# Patient Record
Sex: Male | Born: 1960 | Race: Black or African American | Hispanic: No | Smoking: Former smoker
Health system: Southern US, Community
[De-identification: ages and names within clinical notes are randomized; demographics above are authoritative.]

## PROBLEM LIST (undated history)

## (undated) DIAGNOSIS — K219 Gastro-esophageal reflux disease without esophagitis: Secondary | ICD-10-CM

## (undated) DIAGNOSIS — Z720 Tobacco use: Secondary | ICD-10-CM

## (undated) DIAGNOSIS — R55 Syncope and collapse: Secondary | ICD-10-CM

## (undated) DIAGNOSIS — F1910 Other psychoactive substance abuse, uncomplicated: Secondary | ICD-10-CM

## (undated) DIAGNOSIS — F319 Bipolar disorder, unspecified: Secondary | ICD-10-CM

## (undated) DIAGNOSIS — I509 Heart failure, unspecified: Secondary | ICD-10-CM

## (undated) DIAGNOSIS — G4730 Sleep apnea, unspecified: Secondary | ICD-10-CM

## (undated) DIAGNOSIS — Z91199 Patient's noncompliance with other medical treatment and regimen due to unspecified reason: Secondary | ICD-10-CM

## (undated) DIAGNOSIS — I8290 Acute embolism and thrombosis of unspecified vein: Secondary | ICD-10-CM

## (undated) DIAGNOSIS — F329 Major depressive disorder, single episode, unspecified: Secondary | ICD-10-CM

## (undated) DIAGNOSIS — Z9119 Patient's noncompliance with other medical treatment and regimen: Secondary | ICD-10-CM

## (undated) DIAGNOSIS — I5022 Chronic systolic (congestive) heart failure: Secondary | ICD-10-CM

## (undated) DIAGNOSIS — I513 Intracardiac thrombosis, not elsewhere classified: Secondary | ICD-10-CM

## (undated) DIAGNOSIS — E7800 Pure hypercholesterolemia, unspecified: Secondary | ICD-10-CM

## (undated) DIAGNOSIS — Z95810 Presence of automatic (implantable) cardiac defibrillator: Secondary | ICD-10-CM

## (undated) DIAGNOSIS — N189 Chronic kidney disease, unspecified: Secondary | ICD-10-CM

## (undated) DIAGNOSIS — R160 Hepatomegaly, not elsewhere classified: Secondary | ICD-10-CM

## (undated) DIAGNOSIS — R0609 Other forms of dyspnea: Secondary | ICD-10-CM

## (undated) DIAGNOSIS — I10 Essential (primary) hypertension: Secondary | ICD-10-CM

## (undated) DIAGNOSIS — Z8739 Personal history of other diseases of the musculoskeletal system and connective tissue: Secondary | ICD-10-CM

## (undated) DIAGNOSIS — Z9289 Personal history of other medical treatment: Secondary | ICD-10-CM

## (undated) DIAGNOSIS — I059 Rheumatic mitral valve disease, unspecified: Secondary | ICD-10-CM

## (undated) DIAGNOSIS — G43909 Migraine, unspecified, not intractable, without status migrainosus: Secondary | ICD-10-CM

## (undated) DIAGNOSIS — I340 Nonrheumatic mitral (valve) insufficiency: Secondary | ICD-10-CM

## (undated) DIAGNOSIS — G43009 Migraine without aura, not intractable, without status migrainosus: Secondary | ICD-10-CM

## (undated) HISTORY — DX: Major depressive disorder, single episode, unspecified: F32.9

## (undated) HISTORY — DX: Intracardiac thrombosis, not elsewhere classified: I51.3

## (undated) HISTORY — DX: Chronic kidney disease, unspecified: N18.9

## (undated) HISTORY — DX: Acute embolism and thrombosis of unspecified vein: I82.90

## (undated) HISTORY — PX: TRACHEOSTOMY: SUR1362

## (undated) HISTORY — DX: Patient's noncompliance with other medical treatment and regimen: Z91.19

## (undated) HISTORY — DX: Essential (primary) hypertension: I10

## (undated) HISTORY — DX: Presence of automatic (implantable) cardiac defibrillator: Z95.810

## (undated) HISTORY — DX: Heart failure, unspecified: I50.9

## (undated) HISTORY — DX: Syncope and collapse: R55

## (undated) HISTORY — DX: Rheumatic mitral valve disease, unspecified: I05.9

## (undated) HISTORY — DX: Other psychoactive substance abuse, uncomplicated: F19.10

## (undated) HISTORY — DX: Hepatomegaly, not elsewhere classified: R16.0

## (undated) HISTORY — DX: Bipolar disorder, unspecified: F31.9

## (undated) HISTORY — PX: CARDIAC DEFIBRILLATOR PLACEMENT: SHX171

## (undated) HISTORY — DX: Gastro-esophageal reflux disease without esophagitis: K21.9

## (undated) HISTORY — DX: Migraine, unspecified, not intractable, without status migrainosus: G43.909

## (undated) HISTORY — DX: Tobacco use: Z72.0

## (undated) HISTORY — DX: Nonrheumatic mitral (valve) insufficiency: I34.0

## (undated) HISTORY — DX: Patient's noncompliance with other medical treatment and regimen due to unspecified reason: Z91.199

## (undated) HISTORY — DX: Chronic systolic (congestive) heart failure: I50.22

## (undated) HISTORY — DX: Migraine without aura, not intractable, without status migrainosus: G43.009

---

## 1999-06-15 ENCOUNTER — Emergency Department (HOSPITAL_COMMUNITY): Admission: EM | Admit: 1999-06-15 | Discharge: 1999-06-15 | Payer: Self-pay

## 2001-02-02 ENCOUNTER — Encounter: Payer: Self-pay | Admitting: Emergency Medicine

## 2001-02-02 ENCOUNTER — Emergency Department (HOSPITAL_COMMUNITY): Admission: EM | Admit: 2001-02-02 | Discharge: 2001-02-02 | Payer: Self-pay | Admitting: Emergency Medicine

## 2001-05-28 ENCOUNTER — Encounter: Payer: Self-pay | Admitting: Emergency Medicine

## 2001-05-28 ENCOUNTER — Emergency Department (HOSPITAL_COMMUNITY): Admission: EM | Admit: 2001-05-28 | Discharge: 2001-05-28 | Payer: Self-pay | Admitting: Emergency Medicine

## 2001-07-23 ENCOUNTER — Encounter: Payer: Self-pay | Admitting: Emergency Medicine

## 2001-07-23 ENCOUNTER — Emergency Department (HOSPITAL_COMMUNITY): Admission: EM | Admit: 2001-07-23 | Discharge: 2001-07-23 | Payer: Self-pay | Admitting: Emergency Medicine

## 2002-08-30 ENCOUNTER — Inpatient Hospital Stay (HOSPITAL_COMMUNITY): Admission: EM | Admit: 2002-08-30 | Discharge: 2002-09-02 | Payer: Self-pay

## 2002-08-31 ENCOUNTER — Encounter (INDEPENDENT_AMBULATORY_CARE_PROVIDER_SITE_OTHER): Payer: Self-pay | Admitting: Cardiology

## 2002-08-31 ENCOUNTER — Encounter: Payer: Self-pay | Admitting: Internal Medicine

## 2003-08-22 ENCOUNTER — Ambulatory Visit (HOSPITAL_COMMUNITY): Admission: RE | Admit: 2003-08-22 | Discharge: 2003-08-22 | Payer: Self-pay | Admitting: Family Medicine

## 2003-10-21 ENCOUNTER — Ambulatory Visit (HOSPITAL_COMMUNITY): Admission: RE | Admit: 2003-10-21 | Discharge: 2003-10-21 | Payer: Self-pay | Admitting: Family Medicine

## 2003-10-27 ENCOUNTER — Ambulatory Visit (HOSPITAL_COMMUNITY): Admission: RE | Admit: 2003-10-27 | Discharge: 2003-10-27 | Payer: Self-pay | Admitting: Family Medicine

## 2003-11-01 ENCOUNTER — Emergency Department (HOSPITAL_COMMUNITY): Admission: EM | Admit: 2003-11-01 | Discharge: 2003-11-01 | Payer: Self-pay | Admitting: Emergency Medicine

## 2004-03-06 ENCOUNTER — Ambulatory Visit: Payer: Self-pay | Admitting: Family Medicine

## 2004-03-30 ENCOUNTER — Encounter: Payer: Self-pay | Admitting: Cardiology

## 2004-03-30 ENCOUNTER — Inpatient Hospital Stay (HOSPITAL_COMMUNITY): Admission: EM | Admit: 2004-03-30 | Discharge: 2004-03-31 | Payer: Self-pay | Admitting: Emergency Medicine

## 2004-05-24 ENCOUNTER — Ambulatory Visit: Payer: Self-pay | Admitting: Internal Medicine

## 2004-06-13 ENCOUNTER — Emergency Department (HOSPITAL_COMMUNITY): Admission: EM | Admit: 2004-06-13 | Discharge: 2004-06-13 | Payer: Self-pay | Admitting: Emergency Medicine

## 2004-06-19 ENCOUNTER — Inpatient Hospital Stay (HOSPITAL_COMMUNITY): Admission: EM | Admit: 2004-06-19 | Discharge: 2004-06-21 | Payer: Self-pay | Admitting: *Deleted

## 2004-06-27 ENCOUNTER — Ambulatory Visit: Payer: Self-pay | Admitting: Family Medicine

## 2004-06-28 ENCOUNTER — Ambulatory Visit: Payer: Self-pay | Admitting: Internal Medicine

## 2004-07-25 ENCOUNTER — Ambulatory Visit: Payer: Self-pay | Admitting: Internal Medicine

## 2004-07-28 ENCOUNTER — Emergency Department (HOSPITAL_COMMUNITY): Admission: EM | Admit: 2004-07-28 | Discharge: 2004-07-28 | Payer: Self-pay | Admitting: Emergency Medicine

## 2004-08-01 ENCOUNTER — Ambulatory Visit: Payer: Self-pay | Admitting: Internal Medicine

## 2004-08-01 ENCOUNTER — Inpatient Hospital Stay (HOSPITAL_COMMUNITY): Admission: EM | Admit: 2004-08-01 | Discharge: 2004-08-04 | Payer: Self-pay | Admitting: Emergency Medicine

## 2004-08-01 ENCOUNTER — Ambulatory Visit: Payer: Self-pay | Admitting: Cardiology

## 2004-08-07 ENCOUNTER — Ambulatory Visit: Payer: Self-pay | Admitting: Internal Medicine

## 2004-08-10 ENCOUNTER — Inpatient Hospital Stay (HOSPITAL_COMMUNITY): Admission: RE | Admit: 2004-08-10 | Discharge: 2004-08-11 | Payer: Self-pay | Admitting: Internal Medicine

## 2004-08-19 ENCOUNTER — Ambulatory Visit: Payer: Self-pay | Admitting: Internal Medicine

## 2004-08-19 ENCOUNTER — Encounter: Payer: Self-pay | Admitting: Emergency Medicine

## 2004-08-19 ENCOUNTER — Inpatient Hospital Stay (HOSPITAL_COMMUNITY): Admission: AD | Admit: 2004-08-19 | Discharge: 2004-08-22 | Payer: Self-pay | Admitting: Cardiovascular Disease

## 2004-08-21 ENCOUNTER — Encounter: Payer: Self-pay | Admitting: Cardiology

## 2004-08-21 ENCOUNTER — Ambulatory Visit: Payer: Self-pay | Admitting: Cardiology

## 2004-09-02 ENCOUNTER — Emergency Department (HOSPITAL_COMMUNITY): Admission: EM | Admit: 2004-09-02 | Discharge: 2004-09-02 | Payer: Self-pay | Admitting: Emergency Medicine

## 2004-11-01 ENCOUNTER — Emergency Department (HOSPITAL_COMMUNITY): Admission: EM | Admit: 2004-11-01 | Discharge: 2004-11-01 | Payer: Self-pay | Admitting: Emergency Medicine

## 2004-12-19 ENCOUNTER — Ambulatory Visit: Payer: Self-pay | Admitting: Internal Medicine

## 2005-02-18 ENCOUNTER — Emergency Department (HOSPITAL_COMMUNITY): Admission: EM | Admit: 2005-02-18 | Discharge: 2005-02-18 | Payer: Self-pay | Admitting: *Deleted

## 2005-02-18 ENCOUNTER — Ambulatory Visit: Payer: Self-pay | Admitting: Cardiovascular Disease

## 2005-03-25 ENCOUNTER — Ambulatory Visit: Payer: Self-pay | Admitting: Internal Medicine

## 2005-05-13 ENCOUNTER — Ambulatory Visit: Payer: Self-pay | Admitting: Internal Medicine

## 2005-05-31 ENCOUNTER — Ambulatory Visit: Payer: Self-pay | Admitting: Cardiovascular Disease

## 2005-06-01 ENCOUNTER — Inpatient Hospital Stay (HOSPITAL_COMMUNITY): Admission: EM | Admit: 2005-06-01 | Discharge: 2005-06-05 | Payer: Self-pay | Admitting: *Deleted

## 2005-06-27 ENCOUNTER — Ambulatory Visit: Payer: Self-pay | Admitting: Internal Medicine

## 2005-07-29 ENCOUNTER — Emergency Department (HOSPITAL_COMMUNITY): Admission: EM | Admit: 2005-07-29 | Discharge: 2005-07-29 | Payer: Self-pay | Admitting: Family Medicine

## 2005-07-29 ENCOUNTER — Ambulatory Visit: Payer: Self-pay | Admitting: Internal Medicine

## 2005-10-17 ENCOUNTER — Ambulatory Visit: Payer: Self-pay | Admitting: Internal Medicine

## 2006-01-04 ENCOUNTER — Emergency Department (HOSPITAL_COMMUNITY): Admission: EM | Admit: 2006-01-04 | Discharge: 2006-01-04 | Payer: Self-pay | Admitting: Emergency Medicine

## 2006-01-17 ENCOUNTER — Emergency Department (HOSPITAL_COMMUNITY): Admission: EM | Admit: 2006-01-17 | Discharge: 2006-01-17 | Payer: Self-pay | Admitting: Emergency Medicine

## 2006-05-06 ENCOUNTER — Ambulatory Visit: Payer: Self-pay | Admitting: *Deleted

## 2006-05-06 ENCOUNTER — Inpatient Hospital Stay (HOSPITAL_COMMUNITY): Admission: EM | Admit: 2006-05-06 | Discharge: 2006-05-09 | Payer: Self-pay | Admitting: Emergency Medicine

## 2006-05-07 ENCOUNTER — Encounter: Payer: Self-pay | Admitting: Cardiology

## 2006-05-13 ENCOUNTER — Ambulatory Visit: Payer: Self-pay | Admitting: Internal Medicine

## 2006-05-23 ENCOUNTER — Ambulatory Visit: Payer: Self-pay | Admitting: Cardiology

## 2006-06-04 ENCOUNTER — Ambulatory Visit: Payer: Self-pay | Admitting: Internal Medicine

## 2006-07-01 ENCOUNTER — Ambulatory Visit: Payer: Self-pay | Admitting: Cardiology

## 2006-07-01 ENCOUNTER — Inpatient Hospital Stay (HOSPITAL_COMMUNITY): Admission: EM | Admit: 2006-07-01 | Discharge: 2006-07-02 | Payer: Self-pay | Admitting: Emergency Medicine

## 2006-08-11 ENCOUNTER — Ambulatory Visit: Payer: Self-pay | Admitting: Internal Medicine

## 2006-08-11 LAB — CONVERTED CEMR LAB
CO2: 30 meq/L (ref 19–32)
Calcium: 9.1 mg/dL (ref 8.4–10.5)
Creatinine, Ser: 1.3 mg/dL (ref 0.4–1.5)
GFR calc Af Amer: 77 mL/min
Glucose, Bld: 90 mg/dL (ref 70–99)
Pro B Natriuretic peptide (BNP): 913 pg/mL — ABNORMAL HIGH (ref 0.0–100.0)
TSH: 1.74 microintl units/mL (ref 0.35–5.50)

## 2006-08-20 ENCOUNTER — Ambulatory Visit: Payer: Self-pay | Admitting: Cardiology

## 2006-08-20 ENCOUNTER — Ambulatory Visit: Payer: Self-pay | Admitting: Internal Medicine

## 2006-08-20 ENCOUNTER — Emergency Department (HOSPITAL_COMMUNITY): Admission: EM | Admit: 2006-08-20 | Discharge: 2006-08-20 | Payer: Self-pay | Admitting: Emergency Medicine

## 2006-08-20 LAB — CONVERTED CEMR LAB
BUN: 14 mg/dL (ref 6–23)
Calcium: 9.1 mg/dL (ref 8.4–10.5)
Chloride: 110 meq/L (ref 96–112)
Creatinine, Ser: 1.2 mg/dL (ref 0.4–1.5)
GFR calc non Af Amer: 70 mL/min
Sodium: 147 meq/L — ABNORMAL HIGH (ref 135–145)

## 2006-08-25 ENCOUNTER — Ambulatory Visit: Payer: Self-pay | Admitting: Internal Medicine

## 2006-08-25 LAB — CONVERTED CEMR LAB
BUN: 13 mg/dL (ref 6–23)
CO2: 29 meq/L (ref 19–32)
Calcium: 9.2 mg/dL (ref 8.4–10.5)
Chloride: 105 meq/L (ref 96–112)
Creatinine, Ser: 1.4 mg/dL (ref 0.4–1.5)

## 2006-08-27 ENCOUNTER — Ambulatory Visit: Payer: Self-pay | Admitting: *Deleted

## 2006-08-27 LAB — CONVERTED CEMR LAB
BUN: 11 mg/dL (ref 6–23)
CO2: 30 meq/L (ref 19–32)
Calcium: 8.8 mg/dL (ref 8.4–10.5)
Chloride: 103 meq/L (ref 96–112)
Creatinine, Ser: 0.4 mg/dL (ref 0.4–1.5)
GFR calc Af Amer: 302 mL/min
Glucose, Bld: 93 mg/dL (ref 70–99)

## 2006-09-26 ENCOUNTER — Ambulatory Visit: Payer: Self-pay | Admitting: Internal Medicine

## 2006-09-26 ENCOUNTER — Inpatient Hospital Stay (HOSPITAL_COMMUNITY): Admission: EM | Admit: 2006-09-26 | Discharge: 2006-09-28 | Payer: Self-pay | Admitting: Emergency Medicine

## 2006-10-03 ENCOUNTER — Ambulatory Visit: Payer: Self-pay | Admitting: Internal Medicine

## 2006-10-03 LAB — CONVERTED CEMR LAB
ALT: 25 units/L (ref 0–40)
AST: 27 units/L (ref 0–37)
Alkaline Phosphatase: 52 units/L (ref 39–117)
Basophils Relative: 3.6 % — ABNORMAL HIGH (ref 0.0–1.0)
Bilirubin, Direct: 0.4 mg/dL — ABNORMAL HIGH (ref 0.0–0.3)
Calcium: 9.2 mg/dL (ref 8.4–10.5)
Chloride: 106 meq/L (ref 96–112)
Eosinophils Absolute: 0.3 10*3/uL (ref 0.0–0.6)
Eosinophils Relative: 5.3 % — ABNORMAL HIGH (ref 0.0–5.0)
GFR calc non Af Amer: 59 mL/min
Glucose, Bld: 118 mg/dL — ABNORMAL HIGH (ref 70–99)
HCT: 52.7 % — ABNORMAL HIGH (ref 39.0–52.0)
Hemoglobin: 17.3 g/dL — ABNORMAL HIGH (ref 13.0–17.0)
Lymphocytes Relative: 38.2 % (ref 12.0–46.0)
MCHC: 32.8 g/dL (ref 30.0–36.0)
Monocytes Relative: 5.7 % (ref 3.0–11.0)
Platelets: 297 10*3/uL (ref 150–400)
RBC: 5.61 M/uL (ref 4.22–5.81)

## 2006-10-08 ENCOUNTER — Emergency Department (HOSPITAL_COMMUNITY): Admission: EM | Admit: 2006-10-08 | Discharge: 2006-10-08 | Payer: Self-pay | Admitting: Emergency Medicine

## 2006-10-17 ENCOUNTER — Emergency Department (HOSPITAL_COMMUNITY): Admission: EM | Admit: 2006-10-17 | Discharge: 2006-10-17 | Payer: Self-pay | Admitting: *Deleted

## 2006-11-04 ENCOUNTER — Ambulatory Visit: Payer: Self-pay | Admitting: Gastroenterology

## 2006-11-04 LAB — CONVERTED CEMR LAB
Amylase: 35 units/L (ref 27–131)
Bilirubin, Direct: 0.6 mg/dL — ABNORMAL HIGH (ref 0.0–0.3)
CO2: 28 meq/L (ref 19–32)
Eosinophils Absolute: 0.1 10*3/uL (ref 0.0–0.6)
Eosinophils Relative: 1 % (ref 0.0–5.0)
GFR calc Af Amer: 61 mL/min
GFR calc non Af Amer: 50 mL/min
Glucose, Bld: 109 mg/dL — ABNORMAL HIGH (ref 70–99)
HCT: 52.5 % — ABNORMAL HIGH (ref 39.0–52.0)
Hemoglobin: 17.7 g/dL — ABNORMAL HIGH (ref 13.0–17.0)
Lymphocytes Relative: 27.7 % (ref 12.0–46.0)
MCV: 89.3 fL (ref 78.0–100.0)
Monocytes Absolute: 0.5 10*3/uL (ref 0.2–0.7)
Neutro Abs: 3.9 10*3/uL (ref 1.4–7.7)
Neutrophils Relative %: 62.9 % (ref 43.0–77.0)
Platelets: 279 10*3/uL (ref 150–400)
Sodium: 139 meq/L (ref 135–145)
TSH: 2.72 microintl units/mL (ref 0.35–5.50)
Total Protein: 6.5 g/dL (ref 6.0–8.3)
WBC: 6.2 10*3/uL (ref 4.5–10.5)

## 2006-11-13 ENCOUNTER — Emergency Department (HOSPITAL_COMMUNITY): Admission: EM | Admit: 2006-11-13 | Discharge: 2006-11-14 | Payer: Self-pay | Admitting: Emergency Medicine

## 2006-11-14 ENCOUNTER — Inpatient Hospital Stay (HOSPITAL_COMMUNITY): Admission: EM | Admit: 2006-11-14 | Discharge: 2006-12-03 | Payer: Self-pay | Admitting: Emergency Medicine

## 2006-11-14 ENCOUNTER — Ambulatory Visit: Payer: Self-pay | Admitting: Internal Medicine

## 2006-11-27 ENCOUNTER — Encounter: Payer: Self-pay | Admitting: Internal Medicine

## 2006-12-04 ENCOUNTER — Ambulatory Visit: Payer: Self-pay | Admitting: Cardiology

## 2006-12-04 LAB — CONVERTED CEMR LAB: INR: 2.4 — ABNORMAL HIGH (ref 0.9–2.0)

## 2006-12-11 ENCOUNTER — Ambulatory Visit: Payer: Self-pay | Admitting: Internal Medicine

## 2006-12-11 LAB — CONVERTED CEMR LAB
BUN: 7 mg/dL (ref 6–23)
CO2: 29 meq/L (ref 19–32)
Chloride: 105 meq/L (ref 96–112)
Creatinine, Ser: 1 mg/dL (ref 0.4–1.5)
Potassium: 4.1 meq/L (ref 3.5–5.1)
Sodium: 141 meq/L (ref 135–145)

## 2006-12-16 ENCOUNTER — Ambulatory Visit: Payer: Self-pay | Admitting: Cardiology

## 2006-12-22 ENCOUNTER — Ambulatory Visit: Payer: Self-pay | Admitting: Cardiology

## 2006-12-22 ENCOUNTER — Ambulatory Visit: Payer: Self-pay | Admitting: Internal Medicine

## 2006-12-29 ENCOUNTER — Ambulatory Visit: Payer: Self-pay | Admitting: Internal Medicine

## 2006-12-29 LAB — CONVERTED CEMR LAB
AST: 28 units/L (ref 0–37)
Albumin: 3.6 g/dL (ref 3.5–5.2)
Alkaline Phosphatase: 73 units/L (ref 39–117)
BUN: 9 mg/dL (ref 6–23)
CO2: 32 meq/L (ref 19–32)
Chloride: 112 meq/L (ref 96–112)
Creatinine, Ser: 1.1 mg/dL (ref 0.4–1.5)
Potassium: 4.6 meq/L (ref 3.5–5.1)
Total Bilirubin: 1.2 mg/dL (ref 0.3–1.2)
Total Protein: 6.6 g/dL (ref 6.0–8.3)

## 2007-01-05 ENCOUNTER — Ambulatory Visit: Payer: Self-pay | Admitting: Cardiovascular Disease

## 2007-01-15 ENCOUNTER — Ambulatory Visit: Payer: Self-pay | Admitting: Cardiology

## 2007-01-19 ENCOUNTER — Ambulatory Visit: Payer: Self-pay | Admitting: Internal Medicine

## 2007-01-22 ENCOUNTER — Ambulatory Visit: Payer: Self-pay | Admitting: Cardiology

## 2007-02-17 ENCOUNTER — Ambulatory Visit: Payer: Self-pay | Admitting: Internal Medicine

## 2007-02-17 ENCOUNTER — Ambulatory Visit: Payer: Self-pay | Admitting: Cardiology

## 2007-02-17 LAB — CONVERTED CEMR LAB
ALT: 19 units/L (ref 0–53)
Bilirubin, Direct: 0.2 mg/dL (ref 0.0–0.3)
CO2: 30 meq/L (ref 19–32)
Calcium: 9.8 mg/dL (ref 8.4–10.5)
GFR calc Af Amer: 105 mL/min
GFR calc non Af Amer: 87 mL/min
Glucose, Bld: 78 mg/dL (ref 70–99)
Magnesium: 2 mg/dL (ref 1.5–2.5)
Potassium: 3.8 meq/L (ref 3.5–5.1)
Pro B Natriuretic peptide (BNP): 189 pg/mL — ABNORMAL HIGH (ref 0.0–100.0)
Sodium: 142 meq/L (ref 135–145)
Total Protein: 8.5 g/dL — ABNORMAL HIGH (ref 6.0–8.3)

## 2007-03-18 ENCOUNTER — Ambulatory Visit: Payer: Self-pay | Admitting: Internal Medicine

## 2007-04-08 ENCOUNTER — Ambulatory Visit: Payer: Self-pay | Admitting: Cardiology

## 2007-04-23 ENCOUNTER — Ambulatory Visit: Payer: Self-pay | Admitting: Internal Medicine

## 2007-05-12 ENCOUNTER — Ambulatory Visit: Payer: Self-pay | Admitting: Internal Medicine

## 2007-05-22 ENCOUNTER — Encounter: Payer: Self-pay | Admitting: Internal Medicine

## 2007-05-22 ENCOUNTER — Ambulatory Visit: Payer: Self-pay

## 2007-06-09 ENCOUNTER — Ambulatory Visit: Payer: Self-pay | Admitting: Internal Medicine

## 2007-07-09 ENCOUNTER — Ambulatory Visit: Payer: Self-pay | Admitting: Internal Medicine

## 2007-07-10 ENCOUNTER — Ambulatory Visit (HOSPITAL_COMMUNITY): Admission: RE | Admit: 2007-07-10 | Discharge: 2007-07-10 | Payer: Self-pay | Admitting: Internal Medicine

## 2007-07-24 ENCOUNTER — Ambulatory Visit (HOSPITAL_COMMUNITY): Admission: RE | Admit: 2007-07-24 | Discharge: 2007-07-24 | Payer: Self-pay | Admitting: Internal Medicine

## 2007-08-18 ENCOUNTER — Ambulatory Visit: Payer: Self-pay | Admitting: Cardiovascular Disease

## 2007-08-19 ENCOUNTER — Inpatient Hospital Stay (HOSPITAL_COMMUNITY): Admission: EM | Admit: 2007-08-19 | Discharge: 2007-08-21 | Payer: Self-pay | Admitting: Emergency Medicine

## 2007-08-27 ENCOUNTER — Ambulatory Visit: Payer: Self-pay | Admitting: Cardiovascular Disease

## 2007-08-27 LAB — CONVERTED CEMR LAB
BUN: 14 mg/dL (ref 6–23)
CO2: 33 meq/L — ABNORMAL HIGH (ref 19–32)
Calcium: 9.3 mg/dL (ref 8.4–10.5)
GFR calc Af Amer: 65 mL/min
GFR calc non Af Amer: 54 mL/min
Potassium: 4.5 meq/L (ref 3.5–5.1)

## 2007-09-01 ENCOUNTER — Encounter: Payer: Self-pay | Admitting: Internal Medicine

## 2007-09-03 ENCOUNTER — Ambulatory Visit: Payer: Self-pay | Admitting: Cardiology

## 2007-09-28 ENCOUNTER — Encounter: Payer: Self-pay | Admitting: Internal Medicine

## 2007-10-16 ENCOUNTER — Encounter: Payer: Self-pay | Admitting: Internal Medicine

## 2007-10-22 ENCOUNTER — Telehealth (INDEPENDENT_AMBULATORY_CARE_PROVIDER_SITE_OTHER): Payer: Self-pay | Admitting: *Deleted

## 2007-10-22 ENCOUNTER — Ambulatory Visit: Payer: Self-pay | Admitting: Internal Medicine

## 2007-11-23 ENCOUNTER — Ambulatory Visit: Payer: Self-pay | Admitting: Cardiovascular Disease

## 2007-11-23 ENCOUNTER — Ambulatory Visit: Payer: Self-pay | Admitting: Internal Medicine

## 2008-01-07 ENCOUNTER — Encounter: Payer: Self-pay | Admitting: Internal Medicine

## 2008-03-16 ENCOUNTER — Ambulatory Visit: Payer: Self-pay | Admitting: Internal Medicine

## 2008-04-15 ENCOUNTER — Ambulatory Visit: Payer: Self-pay | Admitting: Cardiology

## 2008-04-15 ENCOUNTER — Inpatient Hospital Stay (HOSPITAL_COMMUNITY): Admission: EM | Admit: 2008-04-15 | Discharge: 2008-04-18 | Payer: Self-pay | Admitting: Emergency Medicine

## 2008-04-20 ENCOUNTER — Ambulatory Visit: Payer: Self-pay | Admitting: Cardiology

## 2008-04-26 ENCOUNTER — Emergency Department (HOSPITAL_COMMUNITY): Admission: EM | Admit: 2008-04-26 | Discharge: 2008-04-26 | Payer: Self-pay | Admitting: Emergency Medicine

## 2008-04-27 ENCOUNTER — Ambulatory Visit: Payer: Self-pay | Admitting: Cardiovascular Disease

## 2008-04-27 LAB — CONVERTED CEMR LAB: INR: 5.3 — ABNORMAL HIGH (ref 0.8–1.0)

## 2008-06-16 ENCOUNTER — Ambulatory Visit: Payer: Self-pay | Admitting: Internal Medicine

## 2008-06-21 ENCOUNTER — Ambulatory Visit: Payer: Self-pay | Admitting: Cardiology

## 2008-06-21 ENCOUNTER — Inpatient Hospital Stay (HOSPITAL_COMMUNITY): Admission: EM | Admit: 2008-06-21 | Discharge: 2008-06-23 | Payer: Self-pay | Admitting: Emergency Medicine

## 2008-06-27 ENCOUNTER — Ambulatory Visit: Payer: Self-pay | Admitting: Internal Medicine

## 2008-07-04 ENCOUNTER — Encounter: Payer: Self-pay | Admitting: Internal Medicine

## 2008-07-05 ENCOUNTER — Ambulatory Visit: Payer: Self-pay | Admitting: Internal Medicine

## 2008-07-05 ENCOUNTER — Ambulatory Visit: Payer: Self-pay | Admitting: Cardiovascular Disease

## 2008-07-18 ENCOUNTER — Ambulatory Visit: Payer: Self-pay | Admitting: Internal Medicine

## 2008-07-18 ENCOUNTER — Ambulatory Visit: Payer: Self-pay | Admitting: Cardiology

## 2008-08-02 ENCOUNTER — Ambulatory Visit: Payer: Self-pay | Admitting: Internal Medicine

## 2008-08-02 LAB — CONVERTED CEMR LAB
CO2: 30 meq/L (ref 19–32)
Calcium: 9 mg/dL (ref 8.4–10.5)
Creatinine, Ser: 1.3 mg/dL (ref 0.4–1.5)
GFR calc Af Amer: 77 mL/min
Pro B Natriuretic peptide (BNP): 1607 pg/mL — ABNORMAL HIGH (ref 0.0–100.0)
Sodium: 143 meq/L (ref 135–145)

## 2008-08-22 ENCOUNTER — Inpatient Hospital Stay (HOSPITAL_COMMUNITY): Admission: EM | Admit: 2008-08-22 | Discharge: 2008-08-27 | Payer: Self-pay | Admitting: Emergency Medicine

## 2008-08-22 ENCOUNTER — Ambulatory Visit: Payer: Self-pay | Admitting: Cardiology

## 2008-09-16 ENCOUNTER — Encounter: Payer: Self-pay | Admitting: Nurse Practitioner

## 2008-09-16 ENCOUNTER — Ambulatory Visit: Payer: Self-pay | Admitting: Cardiology

## 2008-09-16 DIAGNOSIS — R160 Hepatomegaly, not elsewhere classified: Secondary | ICD-10-CM

## 2008-09-16 DIAGNOSIS — R9389 Abnormal findings on diagnostic imaging of other specified body structures: Secondary | ICD-10-CM

## 2008-09-16 DIAGNOSIS — R031 Nonspecific low blood-pressure reading: Secondary | ICD-10-CM | POA: Insufficient documentation

## 2008-09-16 DIAGNOSIS — R0602 Shortness of breath: Secondary | ICD-10-CM

## 2008-09-16 DIAGNOSIS — R5381 Other malaise: Secondary | ICD-10-CM | POA: Insufficient documentation

## 2008-09-16 DIAGNOSIS — R5383 Other fatigue: Secondary | ICD-10-CM

## 2008-09-16 DIAGNOSIS — R012 Other cardiac sounds: Secondary | ICD-10-CM | POA: Insufficient documentation

## 2008-09-16 DIAGNOSIS — Z95810 Presence of automatic (implantable) cardiac defibrillator: Secondary | ICD-10-CM

## 2008-09-16 HISTORY — DX: Hepatomegaly, not elsewhere classified: R16.0

## 2008-09-16 HISTORY — DX: Presence of automatic (implantable) cardiac defibrillator: Z95.810

## 2008-09-20 ENCOUNTER — Encounter: Payer: Self-pay | Admitting: Internal Medicine

## 2008-09-22 ENCOUNTER — Encounter: Payer: Self-pay | Admitting: Internal Medicine

## 2008-09-22 ENCOUNTER — Ambulatory Visit: Payer: Self-pay | Admitting: Internal Medicine

## 2008-09-22 DIAGNOSIS — I5022 Chronic systolic (congestive) heart failure: Secondary | ICD-10-CM

## 2008-09-22 DIAGNOSIS — I509 Heart failure, unspecified: Secondary | ICD-10-CM

## 2008-09-22 DIAGNOSIS — I059 Rheumatic mitral valve disease, unspecified: Secondary | ICD-10-CM

## 2008-09-22 HISTORY — DX: Rheumatic mitral valve disease, unspecified: I05.9

## 2008-09-22 HISTORY — DX: Chronic systolic (congestive) heart failure: I50.22

## 2008-09-26 ENCOUNTER — Encounter: Payer: Self-pay | Admitting: Internal Medicine

## 2008-09-29 ENCOUNTER — Ambulatory Visit: Payer: Self-pay | Admitting: Internal Medicine

## 2008-09-29 ENCOUNTER — Encounter: Payer: Self-pay | Admitting: Nurse Practitioner

## 2008-10-25 ENCOUNTER — Encounter: Payer: Self-pay | Admitting: Internal Medicine

## 2008-10-25 ENCOUNTER — Ambulatory Visit: Payer: Self-pay | Admitting: Internal Medicine

## 2008-11-03 ENCOUNTER — Telehealth: Payer: Self-pay | Admitting: Internal Medicine

## 2008-11-04 ENCOUNTER — Encounter: Payer: Self-pay | Admitting: Internal Medicine

## 2008-11-09 ENCOUNTER — Telehealth: Payer: Self-pay | Admitting: Internal Medicine

## 2008-11-22 ENCOUNTER — Encounter: Payer: Self-pay | Admitting: *Deleted

## 2008-11-25 ENCOUNTER — Telehealth: Payer: Self-pay | Admitting: Internal Medicine

## 2008-12-06 ENCOUNTER — Ambulatory Visit: Payer: Self-pay | Admitting: Internal Medicine

## 2008-12-06 ENCOUNTER — Encounter (INDEPENDENT_AMBULATORY_CARE_PROVIDER_SITE_OTHER): Payer: Self-pay | Admitting: Nurse Practitioner

## 2008-12-09 ENCOUNTER — Encounter: Payer: Self-pay | Admitting: Internal Medicine

## 2008-12-12 ENCOUNTER — Encounter: Payer: Self-pay | Admitting: Internal Medicine

## 2008-12-15 ENCOUNTER — Ambulatory Visit: Payer: Self-pay | Admitting: Internal Medicine

## 2008-12-26 ENCOUNTER — Encounter: Payer: Self-pay | Admitting: Internal Medicine

## 2008-12-28 ENCOUNTER — Encounter: Payer: Self-pay | Admitting: *Deleted

## 2008-12-29 ENCOUNTER — Encounter: Payer: Self-pay | Admitting: Internal Medicine

## 2009-01-12 ENCOUNTER — Encounter (INDEPENDENT_AMBULATORY_CARE_PROVIDER_SITE_OTHER): Payer: Self-pay | Admitting: *Deleted

## 2009-01-31 ENCOUNTER — Encounter: Payer: Self-pay | Admitting: Cardiology

## 2009-02-08 ENCOUNTER — Encounter (INDEPENDENT_AMBULATORY_CARE_PROVIDER_SITE_OTHER): Payer: Self-pay | Admitting: Cardiology

## 2009-02-08 ENCOUNTER — Encounter (INDEPENDENT_AMBULATORY_CARE_PROVIDER_SITE_OTHER): Payer: Self-pay | Admitting: *Deleted

## 2009-02-21 ENCOUNTER — Telehealth: Payer: Self-pay | Admitting: Internal Medicine

## 2009-02-27 ENCOUNTER — Ambulatory Visit: Payer: Self-pay | Admitting: Internal Medicine

## 2009-02-28 ENCOUNTER — Encounter: Payer: Self-pay | Admitting: Internal Medicine

## 2009-03-10 ENCOUNTER — Encounter (INDEPENDENT_AMBULATORY_CARE_PROVIDER_SITE_OTHER): Payer: Self-pay | Admitting: *Deleted

## 2009-03-10 ENCOUNTER — Ambulatory Visit: Payer: Self-pay | Admitting: Internal Medicine

## 2009-03-10 DIAGNOSIS — M549 Dorsalgia, unspecified: Secondary | ICD-10-CM | POA: Insufficient documentation

## 2009-03-10 LAB — CONVERTED CEMR LAB
AST: 21 units/L (ref 0–37)
Alkaline Phosphatase: 63 units/L (ref 39–117)
Basophils Absolute: 0 10*3/uL (ref 0.0–0.1)
Bilirubin, Direct: 0.1 mg/dL (ref 0.0–0.3)
CO2: 32 meq/L (ref 19–32)
Calcium: 9.6 mg/dL (ref 8.4–10.5)
Cholesterol: 222 mg/dL — ABNORMAL HIGH (ref 0–200)
Creatinine, Ser: 1.3 mg/dL (ref 0.4–1.5)
Eosinophils Absolute: 0.1 10*3/uL (ref 0.0–0.7)
GFR calc non Af Amer: 76.42 mL/min (ref >60)
Glucose, Bld: 85 mg/dL (ref 70–99)
HDL: 111 mg/dL (ref >39.00)
Lymphocytes Relative: 32.3 % (ref 12.0–46.0)
MCHC: 33 g/dL (ref 30.0–36.0)
Monocytes Relative: 10.9 % (ref 3.0–12.0)
Neutrophils Relative %: 55.1 % (ref 43.0–77.0)
RBC: 5.58 M/uL (ref 4.22–5.81)
RDW: 15.8 % — ABNORMAL HIGH (ref 11.5–14.6)
Sodium: 141 meq/L (ref 135–145)
Specific Gravity, Urine: 1.02 (ref 1.000–1.030)
Total CHOL/HDL Ratio: 2
Total Protein, Urine: NEGATIVE mg/dL
Triglycerides: 79 mg/dL (ref 0.0–149.0)
Urine Glucose: NEGATIVE mg/dL
VLDL: 15.8 mg/dL (ref 0.0–40.0)
pH: 5 (ref 5.0–8.0)

## 2009-03-16 ENCOUNTER — Encounter: Payer: Self-pay | Admitting: Internal Medicine

## 2009-03-30 ENCOUNTER — Encounter: Payer: Self-pay | Admitting: Internal Medicine

## 2009-04-12 ENCOUNTER — Ambulatory Visit: Payer: Self-pay | Admitting: Internal Medicine

## 2009-04-21 LAB — CONVERTED CEMR LAB
BUN: 12 mg/dL (ref 6–23)
CO2: 32 meq/L (ref 19–32)
Calcium: 9 mg/dL (ref 8.4–10.5)
Creatinine, Ser: 1.3 mg/dL (ref 0.4–1.5)
Glucose, Bld: 104 mg/dL — ABNORMAL HIGH (ref 70–99)
Sodium: 141 meq/L (ref 135–145)

## 2009-04-28 ENCOUNTER — Ambulatory Visit: Payer: Self-pay | Admitting: Internal Medicine

## 2009-04-28 ENCOUNTER — Ambulatory Visit: Payer: Self-pay | Admitting: Cardiovascular Disease

## 2009-05-01 LAB — CONVERTED CEMR LAB
CO2: 28 meq/L (ref 19–32)
Calcium: 8.9 mg/dL (ref 8.4–10.5)
Glucose, Bld: 89 mg/dL (ref 70–99)
Sodium: 143 meq/L (ref 135–145)

## 2009-05-02 ENCOUNTER — Ambulatory Visit: Payer: Self-pay | Admitting: Cardiology

## 2009-05-02 ENCOUNTER — Encounter (INDEPENDENT_AMBULATORY_CARE_PROVIDER_SITE_OTHER): Payer: Self-pay | Admitting: Nurse Practitioner

## 2009-05-05 ENCOUNTER — Ambulatory Visit: Payer: Self-pay | Admitting: Cardiology

## 2009-05-05 ENCOUNTER — Ambulatory Visit: Payer: Self-pay | Admitting: Internal Medicine

## 2009-06-06 ENCOUNTER — Ambulatory Visit: Payer: Self-pay | Admitting: Cardiology

## 2009-06-06 ENCOUNTER — Ambulatory Visit: Payer: Self-pay | Admitting: Internal Medicine

## 2009-06-06 LAB — CONVERTED CEMR LAB: POC INR: 1.6

## 2009-06-07 LAB — CONVERTED CEMR LAB
CO2: 27 meq/L (ref 19–32)
Chloride: 96 meq/L (ref 96–112)
Creatinine, Ser: 1.3 mg/dL (ref 0.4–1.5)
Sodium: 136 meq/L (ref 135–145)

## 2009-06-14 ENCOUNTER — Ambulatory Visit: Payer: Self-pay | Admitting: Internal Medicine

## 2009-06-14 DIAGNOSIS — G43009 Migraine without aura, not intractable, without status migrainosus: Secondary | ICD-10-CM

## 2009-06-14 DIAGNOSIS — G9340 Encephalopathy, unspecified: Secondary | ICD-10-CM | POA: Insufficient documentation

## 2009-06-14 HISTORY — DX: Migraine without aura, not intractable, without status migrainosus: G43.009

## 2009-06-26 ENCOUNTER — Telehealth (INDEPENDENT_AMBULATORY_CARE_PROVIDER_SITE_OTHER): Payer: Self-pay | Admitting: *Deleted

## 2009-07-20 ENCOUNTER — Encounter (INDEPENDENT_AMBULATORY_CARE_PROVIDER_SITE_OTHER): Payer: Self-pay | Admitting: Cardiology

## 2009-07-24 ENCOUNTER — Telehealth (INDEPENDENT_AMBULATORY_CARE_PROVIDER_SITE_OTHER): Payer: Self-pay | Admitting: *Deleted

## 2009-08-03 ENCOUNTER — Ambulatory Visit: Payer: Self-pay | Admitting: Internal Medicine

## 2009-08-17 ENCOUNTER — Telehealth: Payer: Self-pay | Admitting: Internal Medicine

## 2009-08-29 ENCOUNTER — Encounter (INDEPENDENT_AMBULATORY_CARE_PROVIDER_SITE_OTHER): Payer: Self-pay | Admitting: *Deleted

## 2009-10-17 ENCOUNTER — Encounter (INDEPENDENT_AMBULATORY_CARE_PROVIDER_SITE_OTHER): Payer: Self-pay | Admitting: Pharmacist

## 2009-11-02 ENCOUNTER — Ambulatory Visit: Payer: Self-pay | Admitting: Internal Medicine

## 2009-11-09 ENCOUNTER — Encounter (INDEPENDENT_AMBULATORY_CARE_PROVIDER_SITE_OTHER): Payer: Self-pay | Admitting: Pharmacist

## 2009-12-13 ENCOUNTER — Encounter (INDEPENDENT_AMBULATORY_CARE_PROVIDER_SITE_OTHER): Payer: Self-pay | Admitting: Pharmacist

## 2010-01-16 ENCOUNTER — Ambulatory Visit: Payer: Self-pay | Admitting: Cardiology

## 2010-01-16 LAB — CONVERTED CEMR LAB: POC INR: 1.1

## 2010-01-24 ENCOUNTER — Ambulatory Visit: Payer: Self-pay | Admitting: Cardiology

## 2010-01-24 ENCOUNTER — Ambulatory Visit: Payer: Self-pay | Admitting: Internal Medicine

## 2010-01-24 LAB — CONVERTED CEMR LAB: POC INR: 4.1

## 2010-01-25 LAB — CONVERTED CEMR LAB
Chloride: 105 meq/L (ref 96–112)
Potassium: 5.1 meq/L (ref 3.5–5.1)
Pro B Natriuretic peptide (BNP): 1216.7 pg/mL — ABNORMAL HIGH (ref 0.0–100.0)

## 2010-02-01 ENCOUNTER — Ambulatory Visit: Payer: Self-pay | Admitting: Internal Medicine

## 2010-02-02 ENCOUNTER — Ambulatory Visit: Payer: Self-pay | Admitting: Internal Medicine

## 2010-02-02 LAB — CONVERTED CEMR LAB: POC INR: 3.4

## 2010-02-09 ENCOUNTER — Ambulatory Visit: Payer: Self-pay | Admitting: Cardiology

## 2010-02-09 LAB — CONVERTED CEMR LAB: POC INR: 2.1

## 2010-02-12 LAB — CONVERTED CEMR LAB
Chloride: 99 meq/L (ref 96–112)
Potassium: 4.3 meq/L (ref 3.5–5.1)
Pro B Natriuretic peptide (BNP): 266.3 pg/mL — ABNORMAL HIGH (ref 0.0–100.0)
Sodium: 142 meq/L (ref 135–145)

## 2010-02-20 ENCOUNTER — Ambulatory Visit: Payer: Self-pay | Admitting: Cardiovascular Disease

## 2010-02-21 ENCOUNTER — Telehealth: Payer: Self-pay | Admitting: Internal Medicine

## 2010-02-28 ENCOUNTER — Ambulatory Visit: Payer: Self-pay | Admitting: Internal Medicine

## 2010-03-02 ENCOUNTER — Encounter: Payer: Self-pay | Admitting: Internal Medicine

## 2010-03-02 LAB — CONVERTED CEMR LAB
Eosinophils Relative: 3.3 % (ref 0.0–5.0)
GFR calc non Af Amer: 78.89 mL/min (ref >60)
HCT: 46.8 % (ref 39.0–52.0)
Hemoglobin: 16 g/dL (ref 13.0–17.0)
Lymphs Abs: 1.4 10*3/uL (ref 0.7–4.0)
Monocytes Relative: 11.2 % (ref 3.0–12.0)
Neutro Abs: 1.8 10*3/uL (ref 1.4–7.7)
Platelets: 242 10*3/uL (ref 150.0–400.0)
Potassium: 4.9 meq/L (ref 3.5–5.1)
Pro B Natriuretic peptide (BNP): 411.5 pg/mL — ABNORMAL HIGH (ref 0.0–100.0)
Sodium: 141 meq/L (ref 135–145)
WBC: 3.7 10*3/uL — ABNORMAL LOW (ref 4.5–10.5)

## 2010-03-14 ENCOUNTER — Ambulatory Visit (HOSPITAL_COMMUNITY): Admission: RE | Admit: 2010-03-14 | Discharge: 2010-03-14 | Payer: Self-pay | Admitting: Internal Medicine

## 2010-03-14 ENCOUNTER — Ambulatory Visit: Payer: Self-pay | Admitting: Cardiology

## 2010-03-14 ENCOUNTER — Ambulatory Visit: Payer: Self-pay | Admitting: Internal Medicine

## 2010-04-25 ENCOUNTER — Ambulatory Visit: Payer: Self-pay | Admitting: Internal Medicine

## 2010-04-25 ENCOUNTER — Ambulatory Visit: Payer: Self-pay | Admitting: Cardiology

## 2010-04-25 LAB — CONVERTED CEMR LAB: POC INR: 1.6

## 2010-05-09 ENCOUNTER — Ambulatory Visit: Payer: Self-pay | Admitting: Cardiology

## 2010-05-09 LAB — CONVERTED CEMR LAB: POC INR: 3.1

## 2010-05-24 ENCOUNTER — Ambulatory Visit: Payer: Self-pay

## 2010-05-31 ENCOUNTER — Ambulatory Visit: Payer: Self-pay | Admitting: Internal Medicine

## 2010-06-22 ENCOUNTER — Ambulatory Visit
Admission: RE | Admit: 2010-06-22 | Discharge: 2010-06-22 | Payer: Self-pay | Source: Home / Self Care | Attending: Cardiovascular Disease | Admitting: Cardiovascular Disease

## 2010-07-03 ENCOUNTER — Encounter: Payer: Self-pay | Admitting: Internal Medicine

## 2010-07-03 ENCOUNTER — Ambulatory Visit
Admission: RE | Admit: 2010-07-03 | Discharge: 2010-07-03 | Payer: Self-pay | Source: Home / Self Care | Attending: Internal Medicine | Admitting: Internal Medicine

## 2010-07-03 DIAGNOSIS — F17200 Nicotine dependence, unspecified, uncomplicated: Secondary | ICD-10-CM | POA: Insufficient documentation

## 2010-07-05 ENCOUNTER — Encounter: Payer: Self-pay | Admitting: Internal Medicine

## 2010-07-06 ENCOUNTER — Ambulatory Visit: Admit: 2010-07-06 | Payer: Self-pay

## 2010-07-15 ENCOUNTER — Encounter: Payer: Self-pay | Admitting: Cardiology

## 2010-07-15 ENCOUNTER — Encounter: Payer: Self-pay | Admitting: Family Medicine

## 2010-07-19 ENCOUNTER — Telehealth: Payer: Self-pay | Admitting: Internal Medicine

## 2010-07-26 ENCOUNTER — Encounter (INDEPENDENT_AMBULATORY_CARE_PROVIDER_SITE_OTHER): Payer: Medicare Other

## 2010-07-26 ENCOUNTER — Ambulatory Visit: Admit: 2010-07-26 | Payer: Self-pay | Admitting: Internal Medicine

## 2010-07-26 DIAGNOSIS — I428 Other cardiomyopathies: Secondary | ICD-10-CM

## 2010-07-26 NOTE — Letter (Signed)
Summary: Custom - Delinquent Coumadin 1  Coumadin  [address]   [place], [place] [address]   Phone: [phone]  Fax: [phone]     October 17, 2009 MRN: [id number]   [name] [address] [place], [place]  [address]   Dear [name],  This letter is being sent to you as a reminder that it is necessary for you to get your INR/PT checked regularly so that we can optimize your care.  Our records indicate that you were scheduled to have a test done recently.  As of today, we have not received the results of this test.  It is very important that you have your INR checked.  Please call our office at the number listed above to schedule an appointment at your earliest convenience.    If you have recently had your protime checked or have discontinued this medication, please contact our office at the above phone number to clarify this issue.  Thank you for this prompt attention to this important health care matter.  Sincerely,   [facility] Team    Appended Document: Custom - Delinquent Coumadin 1 Attempted to contact pt to r/s missed appt with [facility], pt's phone # temporarily disconnected.  Delinquent letter sent to pt. EWJ

## 2010-07-26 NOTE — Progress Notes (Signed)
  Recieved Request for Records from [employer] forwarded to [facility] for processing. [name]  June 26, 2009 10:42 AM    Appended Document:  Recieved a 2nd Request for Records from [employer] forwarded to [facility]   Appended Document:  Received a 3rd request for records from [employer] for records. Request forwarded to [facility].   Appended Document:  Recieved a 4th Request from [employer] forwarded to [facility]   Appended Document:  Recieved another request form [employer] for records forwarded to [facility].Marland KitchenKM

## 2010-07-26 NOTE — Progress Notes (Signed)
Summary: REFILL  Phone Note Refill Request Message from:  Patient on August 17, 2009 8:24 AM  Refills Requested: Medication #1:  KLOR-CON  Medication #2:  HYDRALAZINE HCL 25 MG TABS Take two  tablets by mouth three times a day  Medication #3:  ALLOPURINOL 100 MG TABS as needed SEND TO [facility] [address] [phone]  Initial call taken by: Judie Grieve,  August 17, 2009 8:26 AM    Prescriptions: HYDRALAZINE HCL 25 MG TABS (HYDRALAZINE HCL) Take two  tablets by mouth three times a day  #120 x 10   Entered by:   Hardin Negus, RMA   Authorized by:   Dolores Patty, MD, [facility]   Signed by:   Hardin Negus, RMA on 08/17/2009   Method used:   Electronically to        [facility] [phone]* (retail)       [address]       [place], [place]  [address]       Ph: [phone] or [phone]       Fax: [phone]   RxID:   [id number] ALLOPURINOL 100 MG TABS (ALLOPURINOL) as needed  #30 x 4   Entered by:   Hardin Negus, RMA   Authorized by:   Dolores Patty, MD, [facility]   Signed by:   Hardin Negus, RMA on 08/17/2009   Method used:   Electronically to        [facility] [phone]* (retail)       [address]       [place], [place]  [address]       Ph: [phone] or [phone]       Fax: [phone]   RxID:   [id number]

## 2010-07-26 NOTE — Assessment & Plan Note (Signed)
Summary: wants follow up/mt   Visit Type:  Follow-up Referring Provider:  Dr Gala Romney Primary Provider:  Corwin Levins MD  CC:  headaches and mild dizziness.  History of Present Illness: [name] is 50 y/o with severe CHF due to NICM EF 10-15%, severe MR previously on home milrinone. Was previously on transplant list but was taken off as he didn't respond to a page when a heart became available. Also has h/o mild CRI, NSVT s/p ICD and LV thrombus.    He returns today for follow-up. We haven't seen him since 12/10. Missed several visits with [place] and coumadin clinic.  Having good days and bad days. On good day can walk 4 blocks. On bad days is very fatigued and stomach hurts. Notes that stomach swells and he takes extra lasix 3 or 4x/week. Weight up just a little. No orthopnea or PND. Drinking 2-3 beers per day.   Often runs out of medications and goes several days waiting for a refill. Just restarted coumadin. ICD hasn't fired. Having severe headache with Imdur.   Current Medications (verified): 1)  Digoxin 0.125 Mg Tabs (Digoxin) .... Take A   Tablet By Mouth Daily 2)  Carvedilol 6.25 Mg Tabs (Carvedilol) .... Take One Tablet By Mouth  in Am and 1 1/2 Tablet By Mouth in Pm 3)  Allopurinol 100 Mg Tabs (Allopurinol) .... As Needed 4)  Zocor 10 Mg Tabs (Simvastatin) .... Take One Tab Qd 5)  Lisinopril 5 Mg Tabs (Lisinopril) .... Take One Tab Once Daily 6)  Imdur 30 Mg Xr24h-Tab (Isosorbide Mononitrate) .... Take 1 Tablet By Mouth Two Times A Day 7)  Hydralazine Hcl 25 Mg Tabs (Hydralazine Hcl) .... Take Two  Tablets By Mouth Three Times A Day 8)  Coumadin 5 Mg Tabs (Warfarin Sodium) .... Take By Mouth As Directed 9)  Demadex 20 Mg Tabs (Torsemide) .... Two Times A Day 10)  Spironolactone 25 Mg Tabs (Spironolactone) .... Take 1/2 Ablet By Mouth Daily  Allergies (verified): No Known Drug Allergies  Past History:  Past Medical History: Last updated: 08/02/2008 1. Congestive heart failure  secondary to nonischemic cardiomyopathy       with EF of 10-15%.   2. History of medical noncompliance.   3. Mitral regurgitation.   4. Tobacco use.   5. Hypertension.   6. Previous candidate for transplant list, but taken off due to       noncompliance.   7. History of home milrinone therapy several times for acute       decompensation.   8. History of depression.   9. History of nonsustained ventricular tachycardia status post AICD       implantation.   10.GERD.   11.History of nausea, vomiting, status post EGD for mild-to-moderate       distal gastritis in the setting of NSAID/Goody powder back in 2008.   12.History of substance abuse.  13. Migraines 14. Chronic renal insufficiency  Review of Systems       As per HPI and past medical history; otherwise all systems negative.   Vital Signs:  Patient profile:   50 year old male Height:      66 inches Weight:      140 pounds BMI:     22.68 Pulse rate:   97 / minute BP sitting:   96 / 62  (right arm) Cuff size:   regular  Vitals Entered By: Hardin Negus, RMA (January 24, 2010 3:29 PM)  Physical Exam  General:  well appearing. no resp difficulty HEENT: normal Neck: supple. no JVD. Carotids 2+ bilat; no bruits.  Cor: PMI laterally displaced. Regular rate & rhythm. No rubs. soft sem at apex. + prominent s3 Lungs: clear Abdomen: soft, nontender, nondistended. No hepatosplenomegaly. No bruits or masses. Good bowel sounds. Extremities: no cyanosis, clubbing, rash, edema Neuro: alert & orientedx3, cranial nerves grossly intact. moves all 4 extremities w/o difficulty. affect pleasant    ICD Specifications Following MD:  Lewayne Bunting, MD     Referring MD:  [facility] ICD Vendor:  [facility] [employer]     ICD Model Number:  [id number]     ICD Serial Number:  [id number] ICD DOI:  08/10/2004     ICD Implanting MD:  Lewayne Bunting, MD  Lead 1:    Location: RV     DOI: 08/10/2004     Model #: [id number]     Serial #: [id number]     Status:  active  Indications::  NICM   Episodes Coumadin:  Yes  Brady Parameters Mode VVI     Lower Rate Limit:  40      Tachy Zones VF:  230     VT:  200     VT1:  150     Impression & Recommendations:  Problem # 1:  SYSTOLIC HEART FAILURE, CHRONIC (ICD-428.22) NYHA III with loud 3rd heart sound. Volume status looks good. We had long talk about high-risk nature of his heart disease and how he needs to be 100% compliant with his medications and appointments. Also reinforced need for total abstinence from ETOH. Check labs today. Increase lisinopril to 5 two times a day.  Problem # 2:  Alcohol use Needs to abstain.   Other Orders: EKG w/ Interpretation (93000) TLB-BMP (Basic Metabolic Panel-BMET) (80048-METABOL) TLB-BNP (B-Natriuretic Peptide) (83880-BNPR) Prescriptions: LISINOPRIL 5 MG TABS (LISINOPRIL) Take 1 tablet by mouth two times a day  #60 x 6   Entered by:   Meredith Staggers, RN   Authorized by:   Dolores Patty, MD, [facility]   Signed by:   Meredith Staggers, RN on 01/24/2010   Method used:   Electronically to        [facility]  [address] [phone]* (retail)       [address]       [place], [place]  [address]       Ph: [phone] or [phone]       Fax: [phone]   RxID:   [id number]

## 2010-07-26 NOTE — Cardiovascular Report (Signed)
Summary: Office Visit Remote   Office Visit Remote   Imported By: Roderic Ovens 11/15/2009 16:49:50  _____________________________________________________________________  External Attachment:    Type:   Image     Comment:   External Document

## 2010-07-26 NOTE — Letter (Signed)
Summary: Custom - Delinquent Coumadin 2  Coumadin  [address]   [place], [place] [address]   Phone: [phone]  Fax: [phone]     December 13, 2009 MRN: [id number]   [name] [address] [place], [place]  [address]   Dear [name],  We have attempted to contact you by phone and letter on multiple occasions to contact our office for important blood work associated with the blood thinner, warfarin (Coumadin).  Warfarin is a very important drug that can cause life threatening side effects including, bleeding, and thus requires close laboratory monitoring.  We are unable to accept responsibility for blood thinner-related health problems you may develop because you have not followed our recommendations for appropriate monitoring.  These may include abnormal bleeding occurrences and/or development of blood clots (stroke, heart attack, blood clots in legs or lungs, etc.).  We need for you to contact this office at the number listed above to schedule and complete this very important blood work.  Thank you for your assistance in this urgent matter.  Sincerely,  [facility] Team

## 2010-07-26 NOTE — Letter (Signed)
Summary: Remote Device Check  [facility], Main Office  [address]   [place], [place] [address]   Phone: [phone]  Fax: [phone]     March 02, 2010 MRN: [id number]   [name] [address] [place], [place]  [address]   Dear Mr. WINCHELL,   Your remote transmission was recieved and reviewed by your physician.  All diagnostics were within normal limits for you.   __X____Your next office visit is scheduled for:  November 2011 with Dr Ladona Ridgel. Please call our office to schedule an appointment.    Sincerely,  Vella Kohler

## 2010-07-26 NOTE — Medication Information (Signed)
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 no    Recent/future dental: no  Any missed doses?: no       Is patient compliant with meds? yes      Comments: Pt refused dose changes, will hold 1/2 dose today then return in 2 weeks. Was advised to maintain a consistent diet to allow for better dose titration.   Allergies: No Known Drug Allergies  Anticoagulation Management History:      The patient is taking warfarin and comes in today for a routine follow up visit.  Negative risk factors for bleeding include an age less than 4 years old.  The bleeding index is 'low risk'.  Positive CHADS2 values include History of CHF.  Negative CHADS2 values include Age > 30 years old.  The start date was 11/24/2006.  His last INR was 5.3 RATIO.  Anticoagulation responsible provider: Riley Kill MD, Maisie Fus.  INR POC: 3.1.  Exp: 04/2011.    Anticoagulation Management Assessment/Plan:      The patient's current anticoagulation dose is Coumadin 5 mg tabs: Take by mouth as directed.  The target INR is 2 - 3.  The next INR is due 05/23/2010.  Anticoagulation instructions were given to patient.  Results were reviewed/authorized by Weston Brass, PharmD.  He was notified by Hoy Register, PharmD Candidate.         Prior Anticoagulation Instructions: INR 1.6  Take extra 1 tablet today then increase dose to 1 tablet every day.  Recheck INR in 2 weeks.   Current Anticoagulation Instructions: INR 3.1 Take 1/2 tablet today then resume previous dose of 1 tablet everyday  Recheck  INR in 2 weeks

## 2010-07-26 NOTE — Progress Notes (Signed)
Summary: refill meds  Phone Note Refill Request Call back at Home Phone [phone] Message from:  Patient on July 19, 2010 8:38 AM  Refills Requested: Medication #1:  LISINOPRIL 10 MG TABS Take 1 tablet by mouth two times a day  Medication #2:  CARVEDILOL 12.5 MG TABS Take one tablet by mouth twice a day [facility] [address]    Method Requested: Fax to Local Pharmacy Initial call taken by: Lorne Skeens,  July 19, 2010 8:39 AM    Prescriptions: LISINOPRIL 10 MG TABS (LISINOPRIL) Take 1 tablet by mouth two times a day  #60 x 11   Entered by:   Hardin Negus, RMA   Authorized by:   Dolores Patty, MD, [facility]   Signed by:   Hardin Negus, RMA on 07/19/2010   Method used:   Electronically to        [facility] [phone]* (retail)       [address]       [place], [place]  [address]       Ph: [phone] or [phone]       Fax: [phone]   RxID:   [id number]

## 2010-07-26 NOTE — Letter (Signed)
Summary: Custom - Delinquent Coumadin 1  Coumadin  [address]   [place], [place] [address]   Phone: [phone]  Fax: [phone]     July 20, 2009 MRN: [id number]   [name] [address] [place], [place]  [address]   Dear [name],  This letter is being sent to you as a reminder that it is necessary for you to get your INR/PT checked regularly so that we can optimize your care.  Our records indicate that you were scheduled to have a test done recently.  As of today, we have not received the results of this test.  It is very important that you have your INR checked.  Please call our office at the number listed above to schedule an appointment at your earliest convenience.    If you have recently had your protime checked or have discontinued this medication, please contact our office at the above phone number to clarify this issue.  Thank you for this prompt attention to this important health care matter.  Sincerely,   [facility] Team    Appended Document: Custom - Delinquent Coumadin 1 Attempted to contact pt.  LMOM to call back to r/s missed appt.  Sent delinquent letter to home address.  EWJ

## 2010-07-26 NOTE — Letter (Signed)
Summary: Discharge Application Total & Permanent Disability  Discharge Application Total & Permanent Disability   Imported By: Marylou Mccoy 07/17/2010 12:30:55  _____________________________________________________________________  External Attachment:    Type:   Image     Comment:   External Document

## 2010-07-26 NOTE — Progress Notes (Signed)
  Phone Note Other Incoming   Summary of Call: Patient's chart has been requested by [facility] on 07/24/09. Forwarded to [employer] on 07/24/09

## 2010-07-26 NOTE — Medication Information (Signed)
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 no    Recent/future dental: no  Any missed doses?: yes     Details: missed yesterdays dose   Is patient compliant with meds? yes       Allergies: No Known Drug Allergies  Anticoagulation Management History:      The patient is taking warfarin and comes in today for a routine follow up visit.  Negative risk factors for bleeding include an age less than 70 years old.  The bleeding index is 'low risk'.  Positive CHADS2 values include History of CHF.  Negative CHADS2 values include Age > 54 years old.  The start date was 11/24/2006.  His last INR was 5.3 RATIO.  Anticoagulation responsible provider: Clifton James MD, Cristal Deer.  INR POC: 1.9.  Cuvette Lot#: [id number].  Exp: 04/2011.    Anticoagulation Management Assessment/Plan:      The patient's current anticoagulation dose is Coumadin 5 mg tabs: Take by mouth as directed.  The target INR is 2 - 3.  The next INR is due 07/06/2010.  Anticoagulation instructions were given to patient.  Results were reviewed/authorized by Tammy Sours, PharmD .         Prior Anticoagulation Instructions: INR 3.1 Take 1/2 tablet today then resume previous dose of 1 tablet everyday  Recheck INR in 2 weeks   Current Anticoagulation Instructions: INR 1.9  Take 1 and 1/2 tablet today then resume regular schedule of 1 tablet everday. Recheck INR in 2 weeks.  Prescriptions: COUMADIN 5 MG TABS (WARFARIN SODIUM) Take by mouth as directed  #35 x 1   Entered by:   Cloyde Reams RN   Authorized by:   Dolores Patty, MD, [facility]   Signed by:   Cloyde Reams RN on 06/22/2010   Method used:   Electronically to        [facility]  [address] [phone]* (retail)       [address]       [place], [place]  [address]       Ph: [phone] or [phone]       Fax: [phone]   RxID:   [id number]

## 2010-07-26 NOTE — Cardiovascular Report (Signed)
Summary: Office Visit Remote   Office Visit Remote   Imported By: Roderic Ovens 08/21/2009 10:48:52  _____________________________________________________________________  External Attachment:    Type:   Image     Comment:   External Document

## 2010-07-26 NOTE — Medication Information (Signed)
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 no    Recent/future dental: no  Any missed doses?: yes     Details: Ran out 3 weeks ago  Is patient compliant with meds? yes       Allergies: No Known Drug Allergies  Anticoagulation Management History:      The patient is taking warfarin and comes in today for a routine follow up visit.  Negative risk factors for bleeding include an age less than 49 years old.  The bleeding index is 'low risk'.  Positive CHADS2 values include History of CHF.  Negative CHADS2 values include Age > 46 years old.  The start date was 11/24/2006.  His last INR was 5.3 RATIO.  Anticoagulation responsible provider: Juanda [place] MD, Smitty Cords.  INR POC: 1.1.  Cuvette Lot#: [id number].  Exp: 03/2011.    Anticoagulation Management Assessment/Plan:      The patient's current anticoagulation dose is Coumadin 5 mg tabs: Take by mouth as directed.  The target INR is 2 - 3.  The next INR is due 01/24/2010.  Anticoagulation instructions were given to patient.  Results were reviewed/authorized by Bethena Midget, RN, BSN.  He was notified by Bethena Midget, RN, BSN.         Prior Anticoagulation Instructions: INR 1.6 Today take 1 1/2 pill then take 1 pill everyday except  1 1/2 pill on Wednesdays. Recheck in  10 days weeks.   Current Anticoagulation Instructions: INR 1.1 Today 1 1/2 pills then resume 1 pill everyday except 1 1/2  pills on Wednesdays. Recheck in one week.  Prescriptions: COUMADIN 5 MG TABS (WARFARIN SODIUM) Take by mouth as directed  #35 tablet x 0   Entered by:   Bethena Midget, RN, BSN   Authorized  by:   Dolores Patty, MD, [facility]   Signed by:   Bethena Midget, RN, BSN on 01/16/2010   Method used:   Electronically to        [facility] [phone]* (retail)       [address]       [place], [place]  [address]       Ph: [phone] or [phone]       Fax: [phone]   RxID:   [id number]

## 2010-07-26 NOTE — Cardiovascular Report (Signed)
Summary: Office Visit Remote   Office Visit Remote   Imported By: Roderic Ovens 03/06/2010 09:57:43  _____________________________________________________________________  External Attachment:    Type:   Image     Comment:   External Document

## 2010-07-26 NOTE — Assessment & Plan Note (Signed)
Summary: PER CHECK OUT/SF   Visit Type:  Follow-up Referring Provider:  Dr Gala Romney Primary Provider:  Corwin Levins MD   History of Present Illness: [name] returns today for followup.  He is a pleasant 50 yo man with a h/o DCM, class 2-3 CHF, HTN, polysubstance abuse who returns today for followup.  He has not received any ICD therapies. His CHF symptoms appear to be well controlled.  Current Medications (verified): 1)  Digoxin 0.125 Mg Tabs (Digoxin) .... Take A   Tablet By Mouth Daily 2)  Carvedilol 12.5 Mg Tabs (Carvedilol) .... Take One Tablet By Mouth Twice A Day 3)  Allopurinol 100 Mg Tabs (Allopurinol) .... As Needed 4)  Lisinopril 5 Mg Tabs (Lisinopril) .... Take 1 Tablet By Mouth Two Times A Day 5)  Imdur 30 Mg Xr24h-Tab (Isosorbide Mononitrate) .... Take 1 Tablet By Two Times A Day 6)  Hydralazine Hcl 25 Mg Tabs (Hydralazine Hcl) .... Take Two  Tablets By Mouth Three Times A Day 7)  Coumadin 5 Mg Tabs (Warfarin Sodium) .... Take By Mouth As Directed 8)  Demadex 20 Mg Tabs (Torsemide) .... Two Times A Day 9)  Spironolactone 25 Mg Tabs (Spironolactone) .... Take 1/2 Ablet By Mouth Daily  Allergies (verified): No Known Drug Allergies  Past History:  Past Medical History: Last updated: 08/02/2008 1. Congestive heart failure secondary to nonischemic cardiomyopathy       with EF of 10-15%.   2. History of medical noncompliance.   3. Mitral regurgitation.   4. Tobacco use.   5. Hypertension.   6. Previous candidate for transplant list, but taken off due to       noncompliance.   7. History of home milrinone therapy several times for acute       decompensation.   8. History of depression.   9. History of nonsustained ventricular tachycardia status post AICD       implantation.   10.GERD.   11.History of nausea, vomiting, status post EGD for mild-to-moderate       distal gastritis in the setting of NSAID/Goody powder back in 2008.   12.History of substance abuse.    13. Migraines 14. Chronic renal insufficiency  Past Surgical History: Last updated: 08/02/2008 ICD placement  Vital Signs:  Patient profile:   50 year old male Height:      66 inches Weight:      143 pounds BMI:     23.16 Pulse rate:   83 / minute BP sitting:   110 / 90  (left arm)  Vitals Entered By: Laurance Flatten CMA (April 25, 2010 12:33 PM)  Physical Exam  General:  well appearing. no resp difficulty HEENT: normal Neck: supple. no JVD. Carotids 2+ bilat; no bruits.  Cor: PMI laterally displaced. Regular rate & rhythm. No rubs. soft sem at apex. + prominent s3 Lungs: clear Abdomen: soft, nontender, nondistended. No hepatosplenomegaly. No bruits or masses. Good bowel sounds. Extremities: no cyanosis, clubbing, rash, edema Neuro: alert & orientedx3, cranial nerves grossly intact. moves all 4 extremities w/o difficulty. affect pleasant    ICD Specifications Following MD:  Lewayne Bunting, MD     Referring MD:  [facility] ICD Vendor:  [facility] [employer]     ICD Model Number:  [phone]     ICD Serial Number:  [id number] ICD DOI:  08/10/2004     ICD Implanting MD:  Lewayne Bunting, MD  Lead 1:    Location: RV     DOI: 08/10/2004  Model #: [id number]     Serial #: [id number]     Status: active  Indications::  NICM   ICD Follow Up Battery Voltage:  2.66 V     Charge Time:  10.8 seconds     Underlying rhythm:  SR   ICD Device Measurements Right Ventricle:  Amplitude: 12.5 mV, Impedance: 421 ohms, Threshold: 1.8 V at 0.5 msec Shock Impedance: 50 ohms   Episodes MS Episodes:  0     Coumadin:  Yes Shock:  0     ATP:  0      Brady Parameters Mode VVI     Lower Rate Limit:  40      Tachy Zones VF:  230     VT:  200     VT1:  150     Next Remote Date:  07/26/2010     Next Cardiology Appt Due:  04/25/2011 Tech Comments:  NST EPISODES.  NORMAL DEVICE FUNCTION.  NO CHANGES MADE. LATITUDE CHECK 07-26-10 AND ROV 12 MTHS W/GT. Vella Kohler MD Comments:  Agree with above.  Impression &  Recommendations:  Problem # 1:  SYSTOLIC HEART FAILURE, CHRONIC (ICD-428.22) His symptoms appear to be well controlled.  He will continue his meds as below.  He has a h/o non-compliance and I have again asked for a low sodium diet. His updated medication list for this problem includes:    Digoxin 0.125 Mg Tabs (Digoxin) .Marland Kitchen... Take a   tablet by mouth daily    Carvedilol 12.5 Mg Tabs (Carvedilol) .Marland Kitchen... Take one tablet by mouth twice a day    Lisinopril 5 Mg Tabs (Lisinopril) .Marland Kitchen... Take 1 tablet by mouth two times a day    Imdur 30 Mg Xr24h-tab (Isosorbide mononitrate) .Marland Kitchen... Take 1 tablet by two times a day    Coumadin 5 Mg Tabs (Warfarin sodium) .Marland Kitchen... Take by mouth as directed    Demadex 20 Mg Tabs (Torsemide) .Marland Kitchen..Marland Kitchen Two times a day    Spironolactone 25 Mg Tabs (Spironolactone) .Marland Kitchen... Take 1/2 ablet by mouth daily  Problem # 2:  ICD - IN SITU (ICD-V45.02) His device is working normally.  Will recheck in several months.

## 2010-07-26 NOTE — Assessment & Plan Note (Signed)
Summary: rov. gd   Visit Type:  Follow-up Referring Provider:  Dr Gala Romney Primary Provider:  Corwin Levins MD  CC:  no compliants.  History of Present Illness: [name] is 50 y/o with severe CHF due to NICM EF 10-15%, severe MR previously on home milrinone. Was previously on transplant list but was taken off as he didn't respond to a page when a heart became available. Also has h/o mild CRI, NSVT s/p ICD and LV thrombus.    Had CPX 9/11: pVO2 23.3  Ve/VCo2 29  RER 1.22 O2 pulse 64%  Doing very well. No dyspnea or swelling. Reports compliance with all meds. No orthopnea, PND or edema. No ICD firings.   Smoking 1 or 2 cigarettes "every blue moon."   Current Medications (verified): 1)  Digoxin 0.125 Mg Tabs (Digoxin) .... Take A   Tablet By Mouth Daily 2)  Carvedilol 12.5 Mg Tabs (Carvedilol) .... Take One Tablet By Mouth Twice A Day 3)  Allopurinol 100 Mg Tabs (Allopurinol) .... As Needed 4)  Lisinopril 5 Mg Tabs (Lisinopril) .... Take 1 Tablet By Mouth Two Times A Day 5)  Imdur 30 Mg Xr24h-Tab (Isosorbide Mononitrate) .... Take 1 Tablet By Two Times A Day 6)  Hydralazine Hcl 25 Mg Tabs (Hydralazine Hcl) .... Take Two  Tablets By Mouth Three Times A Day 7)  Coumadin 5 Mg Tabs (Warfarin Sodium) .... Take By Mouth As Directed 8)  Demadex 20 Mg Tabs (Torsemide) .... Two Times A Day 9)  Spironolactone 25 Mg Tabs (Spironolactone) .... Take 1/2 Ablet By Mouth Daily  Allergies (verified): No Known Drug Allergies  Past History:  Past Medical History: Last updated: 08/02/2008 1. Congestive heart failure secondary to nonischemic cardiomyopathy       with EF of 10-15%.   2. History of medical noncompliance.   3. Mitral regurgitation.   4. Tobacco use.   5. Hypertension.   6. Previous candidate for transplant list, but taken off due to       noncompliance.   7. History of home milrinone therapy several times for acute       decompensation.   8. History of depression.   9. History of  nonsustained ventricular tachycardia status post AICD       implantation.   10.GERD.   11.History of nausea, vomiting, status post EGD for mild-to-moderate       distal gastritis in the setting of NSAID/Goody powder back in 2008.   12.History of substance abuse.  13. Migraines 14. Chronic renal insufficiency  Review of Systems       As per HPI and past medical history; otherwise all systems negative.   Vital Signs:  Patient profile:   50 year old male Height:      66 inches Weight:      147 pounds BMI:     23.81 Pulse rate:   78 / minute BP sitting:   112 / 76  (left arm) Cuff size:   regular  Vitals Entered By: Hardin Negus, RMA (July 03, 2010 2:37 PM)  Physical Exam  General:  well appearing. no resp difficulty HEENT: normal Neck: supple. no JVD. Carotids 2+ bilat; no bruits.  Cor: PMI laterally displaced. Regular rate & rhythm. No rubs. soft sem at apex. ? soft s3 Lungs: clear Abdomen: soft, nontender, nondistended. No hepatosplenomegaly. No bruits or masses. Good bowel sounds. Extremities: no cyanosis, clubbing, rash, edema Neuro: alert & orientedx3, cranial nerves grossly intact. moves all 4 extremities w/o difficulty. affect  pleasant    ICD Specifications Following MD:  Lewayne Bunting, MD     Referring MD:  [facility] ICD Vendor:  [facility] [employer]     ICD Model Number:  [phone]     ICD Serial Number:  [id number] ICD DOI:  08/10/2004     ICD Implanting MD:  Lewayne Bunting, MD  Lead 1:    Location: RV     DOI: 08/10/2004     Model #: [id number]     Serial #: [id number]     Status: active  Indications::  NICM   Episodes Coumadin:  Yes  Brady Parameters Mode VVI     Lower Rate Limit:  40      Tachy Zones VF:  230     VT:  200     VT1:  150     Impression & Recommendations:  Problem # 1:  SYSTOLIC HEART FAILURE, CHRONIC (ICD-428.22) Doing very well. CPX very reassuring. Will titrate lisinopril to 10 twice daily. Check BMET in 1 week as potassium has been up a bit in past.     Problem # 2:  TOBACCO ABUSE (ICD-305.1) Counseled on need to stop smoking.  Other Orders: EKG w/ Interpretation (93000)  Patient Instructions: 1)  Increase Lisinopril to 10mg  two times a day  2)  Follow up in 1 month Prescriptions: LISINOPRIL 10 MG TABS (LISINOPRIL) Take 1 tablet by mouth two times a day  #60 x 6   Entered by:   Meredith Staggers, RN   Authorized by:   Dolores Patty, MD, [facility]   Signed by:   Meredith Staggers, RN on 07/03/2010   Method used:   [facility]  [address] [phone]       [address]       [place], [place]  [id number]       Ph: [phone] or [phone]       Fax: [phone]   RxID:   [id number]

## 2010-07-26 NOTE — Cardiovascular Report (Signed)
Summary: Office Visit   Office Visit   Imported By: Roderic Ovens 05/03/2010 16:09:48  _____________________________________________________________________  External Attachment:    Type:   Image     Comment:   External Document

## 2010-07-26 NOTE — Progress Notes (Signed)
Summary: refill request  Phone Note Refill Request Message from:  Patient on February 21, 2010 11:30 AM  pt wants refill of potassium at [address]   Method Requested: Telephone to Pharmacy Initial call taken by: Glynda Jaeger,  February 21, 2010 11:30 AM Caller: Patient  [phone] Reason for Call: Talk to Nurse  Follow-up for Phone Call        pt seen 9/7 by Dr Warnell Forester, RN  February 28, 2010 9:41 AM

## 2010-07-26 NOTE — Medication Information (Signed)
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 no    Recent/future dental: no  Any missed doses?: no       Is patient compliant with meds? yes       Allergies: No Known Drug Allergies  Anticoagulation Management History:      The patient is taking warfarin and comes in today for a routine follow up visit.  Negative risk factors for bleeding include an age less than 30 years old.  The bleeding index is 'low risk'.  Positive CHADS2 values include History of CHF.  Negative CHADS2 values include Age > 70 years old.  The start date was 11/24/2006.  His last INR was 5.3 RATIO.  Anticoagulation responsible provider: Tenny Craw MD, Gunnar Fusi.  INR POC: 3.4.  Cuvette Lot#: [id number].  Exp: 03/2011.    Anticoagulation Management Assessment/Plan:      The patient's current anticoagulation dose is Coumadin 5 mg tabs: Take by mouth as directed.  The target INR is 2 - 3.  The next INR is due 02/09/2010.  Anticoagulation instructions were given to patient.  Results were reviewed/authorized by Weston Brass, PharmD.  He was notified by Gweneth Fritter, PharmD Candidate.         Prior Anticoagulation Instructions: INR 4.1 Skip Thursdays dose then resume 1 pill everyday except 1.5 pills on Wednesdays. Recheck in 12 day days.   Current Anticoagulation Instructions: INR 3.4  Skip today's dose.  Then take 1 tablet (5mg ) every day.  Recheck next week.

## 2010-07-26 NOTE — Medication Information (Signed)
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 no    Recent/future dental: no  Any missed doses?: yes     Details: Missed a dose but doubled up the following day.    Is patient compliant with meds? yes       Allergies: No Known Drug Allergies  Anticoagulation Management History:      The patient is taking warfarin and comes in today for a routine follow up visit.  Negative risk factors for bleeding include an age less than 16 years old.  The bleeding index is 'low risk'.  Positive CHADS2 values include History of CHF.  Negative CHADS2 values include Age > 40 years old.  The start date was 11/24/2006.  His last INR was 5.3 RATIO.  Anticoagulation responsible provider: Riley Kill MD, Maisie Fus.  INR POC: 2.1.  Cuvette Lot#: [id number].  Exp: 03/2011.    Anticoagulation Management Assessment/Plan:      The patient's current anticoagulation dose is Coumadin 5 mg tabs: Take by mouth as directed.  The target INR is 2 - 3.  The next INR is due 02/15/2010.  Anticoagulation instructions were given to patient.  Results were reviewed/authorized by Weston Brass, PharmD.  He was notified by Gweneth Fritter, PharmD Candidate.         Prior Anticoagulation Instructions: INR 3.4  Skip today's dose.  Then take 1 tablet (5mg ) every day.  Recheck next week.   Current Anticoagulation Instructions: INR 2.1  Continue taking 1 tablet (5mg ) every day.  Recheck next week.

## 2010-07-26 NOTE — Medication Information (Signed)
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 yes       Details: Lisinopril dose increased  Recent/future dental: no  Any missed doses?: no       Is patient compliant with meds? yes      Comments: Saw Dr Gala Romney today   Allergies: No Known Drug Allergies  Anticoagulation Management History:      The patient is taking warfarin and comes in today for a routine follow up visit.  Negative risk factors for bleeding include an age less than 46 years old.  The bleeding index is 'low risk'.  Positive CHADS2 values include History of CHF.  Negative CHADS2 values include Age > 67 years old.  The start date was 11/24/2006.  His last INR was 5.3 RATIO.  Anticoagulation responsible provider: Riley Kill MD, Maisie Fus.  INR POC: 4.1.  Cuvette Lot#: [id number].  Exp: 03/2011.    Anticoagulation Management Assessment/Plan:      The patient's current anticoagulation dose is Coumadin 5 mg tabs: Take by mouth as directed.  The target INR is 2 - 3.  The next INR is due 02/02/2010.  Anticoagulation instructions were given to patient.  Results were reviewed/authorized by Bethena Midget, RN, BSN.  He was notified by Bethena Midget, RN, BSN.         Prior Anticoagulation Instructions: INR 1.1 Today 1 1/2 pills then resume 1 pill everyday except 1 1/2  pills on Wednesdays. Recheck in one week.   Current Anticoagulation Instructions: INR 4.1 Skip Thursdays dose then resume 1 pill everyday except 1.5 pills on Wednesdays. Recheck in 12 day days.

## 2010-07-26 NOTE — Letter (Signed)
Summary: Appointment - Missed  [facility]     [place], [place]    Phone:   Fax:      August 29, 2009 MRN: [id number]   [name] [address] [place], [place]  [address]   Dear Mr. CHECK,  Our records indicate you missed your appointment on 08-23-2009   with Dr. Gala Romney  It is very important that we reach you to reschedule this appointment. We look forward to participating in your health care needs. Please contact us at the number listed above at your earliest convenience to reschedule this appointment.     Sincerely,   Lorne Skeens  [facility] Scheduling Team

## 2010-07-26 NOTE — Letter (Signed)
Summary: [facility]inquent Coumadin [facility]  Coumadin  [address]   [place], [place] [address]   Phone: [phone]  Fax: [phone]     Nov 09, 2009 MRN: [id number]   [name] [address] [place], [place]  [address]   Dear Mr. BRAINERD,  This letter is being sent to you as a reminder that it is necessary for you to get your INR/PT checked regularly so that we can optimize your care.  Our records indicate that you were scheduled to have a test done recently.  As of today, we have not received the results of this test.  It is very important that you have your INR checked.  Please call our office at the number listed above to schedule an appointment at your earliest convenience.    If you have recently had your protime checked or have discontinued this medication, please contact our office at the above phone number to clarify this issue.  Thank you for this prompt attention to this important health care matter.  Sincerely,   [facility] Team

## 2010-07-26 NOTE — Medication Information (Signed)
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 no    Recent/future dental: no  Any missed doses?: yes     Details: may have missed several doses   Is patient compliant with meds? yes       Allergies: No Known Drug Allergies  Anticoagulation Management History:      The patient is taking warfarin and comes in today for a routine follow up visit.  Negative risk factors for bleeding include an age less than 64 years old.  The bleeding index is 'low risk'.  Positive CHADS2 values include History of CHF.  Negative CHADS2 values include Age > 82 years old.  The start date was 11/24/2006.  His last INR was 5.3 RATIO.  Anticoagulation responsible provider: Juanda Chance MD, Smitty Cords.  INR POC: 1.6.  Exp: 03/2011.    Anticoagulation Management Assessment/Plan:      The patient's current anticoagulation dose is Coumadin 5 mg tabs: Take by mouth as directed.  The target INR is 2 - 3.  The next INR is due 03/28/2010.  Anticoagulation instructions were given to patient.  Results were reviewed/authorized by Weston Brass, PharmD.  He was notified by Weston Brass PharmD.         Prior Anticoagulation Instructions: INR 3.5  Today, August 30th, skip your dose of Coumadin.  Then begin new Coumadin schedule of taking 1 tablet (5mg ) every day except take 1/2 tablet (2.5mg ) on Mondays.  Recheck in 2 weeks.   Current Anticoagulation Instructions: INR 1.6  Take 1 1/2 tablets today and tomorrow then resume same dose of 1 tablet every day except 1/2 tablet on Monday.  Recheck INR in 2 weeks.  Prescriptions: COUMADIN 5 MG TABS  (WARFARIN SODIUM) Take by mouth as directed  #35 Tablet x 0   Entered by:   Weston Brass PharmD   Authorized by:   Dolores Patty, MD, [facility]   Signed by:   Weston Brass PharmD on 03/14/2010   Method used:   [phone][facility] (retail[facility]       [address]       [place], [place]  [id number]       Ph: [phone] or [phone]       Fax: [phone]   RxID:   [id number]

## 2010-07-26 NOTE — Medication Information (Signed)
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 no    Recent/future dental: no  Any missed doses?: no       Is patient compliant with meds? yes       Allergies: No Known Drug Allergies  Anticoagulation Management History:      The patient is taking warfarin and comes in today for a routine follow up visit.  Negative risk factors for bleeding include an age less than 30 years old.  The bleeding index is 'low risk'.  Positive CHADS2 values include History of CHF.  Negative CHADS2 values include Age > 61 years old.  The start date was 11/24/2006.  His last INR was 5.3 RATIO.  Anticoagulation responsible [name]: Eden Emms MD, Theron Arista.  INR POC: 3.5.  Cuvette Lot#: [id number].  Exp: 03/2011.    Anticoagulation Management Assessment/Plan:      The patient's current anticoagulation dose is Coumadin 5 mg tabs: Take by mouth as directed.  The target INR is 2 - 3.  The next INR is due 03/06/2010.  Anticoagulation instructions were given to patient.  Results were reviewed/authorized by Weston Brass, PharmD.  He was notified by Gweneth Fritter, PharmD Candidate.         Prior Anticoagulation Instructions: INR 2.1  Continue taking 1 tablet (5mg ) every day.  Recheck next week.    Current Anticoagulation Instructions: INR 3.5  Today, August 30th, skip your dose of Coumadin.  Then begin new Coumadin schedule of taking 1 tablet (5mg ) every day except take 1/2 tablet (2.5mg ) on Mondays.  Recheck in 2 weeks.

## 2010-07-26 NOTE — Medication Information (Signed)
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 no    Recent/future dental: no  Any missed doses?: no       Is patient compliant with meds? yes       Allergies: No Known Drug Allergies  Anticoagulation Management History:      The patient is taking warfarin and comes in today for a routine follow up visit.  Negative risk factors for bleeding include an age less than 48 years old.  The bleeding index is 'low risk'.  Positive CHADS2 values include History of CHF.  Negative CHADS2 values include Age > 48 years old.  The start date was 11/24/2006.  His last INR was 5.3 RATIO.  Anticoagulation responsible provider: Riley Kill MD, Maisie Fus.  INR POC: 1.6.  Cuvette Lot#: [id number].  Exp: 04/2011.    Anticoagulation Management Assessment/Plan:      The patient's current anticoagulation dose is Coumadin 5 mg tabs: Take by mouth as directed.  The target INR is 2 - 3.  The next INR is due 05/09/2010.  Anticoagulation instructions were given to patient.  Results were reviewed/authorized by Weston Brass, PharmD.  He was notified by Weston Brass PharmD.         Prior Anticoagulation Instructions: INR 1.6  Take 1 1/2 tablets today and tomorrow then resume same dose of 1 tablet every day except 1/2 tablet on Monday.  Recheck INR in 2 weeks.   Current Anticoagulation Instructions: INR 1.6  Take extra 1 tablet today then increase dose to 1 tablet every day.  Recheck INR in 2 weeks.

## 2010-07-26 NOTE — Assessment & Plan Note (Signed)
Summary: 9:15  f44m   Visit Type:  Follow-up Referring Provider:  Dr Gala Romney Primary Provider:  Corwin Levins MD  CC:  no complaints.  History of Present Illness: [name] is 50 y/o with severe CHF due to NICM EF 10-15%, severe MR previously on home milrinone. Was previously on transplant list but was taken off as he didn't respond to a page when a heart became available. Also has h/o mild CRI, NSVT s/p ICD and LV thrombus.    He returns today for follow-up.   Doing farily well.  On good day can walk 4 blocks. On bad days is very fatigued and stomach hurts. Notes that stomach swells and he takes extra lasix 3 or 4x/week. Weight up just a little. No orthopnea or PND. Drinking 2-3 beers per day.   Often runs out of medications and goes several days waiting for a refill. Just restarted coumadin. ICD hasn't fired. Having severe headache with Imdur.   Current Medications (verified): 1)  Digoxin 0.125 Mg Tabs (Digoxin) .... Take A   Tablet By Mouth Daily 2)  Carvedilol 6.25 Mg Tabs (Carvedilol) .... Take One Tablet By Mouth  in Am and 1 1/2 Tablet By Mouth in Pm 3)  Allopurinol 100 Mg Tabs (Allopurinol) .... As Needed 4)  Lisinopril 5 Mg Tabs (Lisinopril) .... Take 1 Tablet By Mouth Two Times A Day 5)  Imdur 30 Mg Xr24h-Tab (Isosorbide Mononitrate) .... Take 1 Tablet By Two Times A Day 6)  Hydralazine Hcl 25 Mg Tabs (Hydralazine Hcl) .... Take Two  Tablets By Mouth Three Times A Day 7)  Coumadin 5 Mg Tabs (Warfarin Sodium) .... Take By Mouth As Directed 8)  Demadex 20 Mg Tabs (Torsemide) .... Two Times A Day 9)  Spironolactone 25 Mg Tabs (Spironolactone) .... Take 1/2 Ablet By Mouth Daily  Allergies: No Known Drug Allergies  Vital Signs:  Patient profile:   50 year old male Height:      66 inches Weight:      147 pounds Pulse rate:   80 / minute Pulse rhythm:   regular BP sitting:   100 / 78  (left arm)  Vitals Entered By: Jacquelin Hawking, CMA (February 28, 2010 9:12 AM)  Physical  Exam  General:  well appearing. no resp difficulty HEENT: normal Neck: supple. no JVD. Carotids 2+ bilat; no bruits.  Cor: PMI laterally displaced. Regular rate & rhythm. No rubs. soft sem at apex. + prominent s3 Lungs: clear Abdomen: soft, nontender, nondistended. No hepatosplenomegaly. No bruits or masses. Good bowel sounds. Extremities: no cyanosis, clubbing, rash, edema Neuro: alert & orientedx3, cranial nerves grossly intact. moves all 4 extremities w/o difficulty. affect pleasant    ICD Specifications Following MD:  Lewayne Bunting, MD     Referring MD:  [facility] ICD Vendor:  [facility] [employer]     ICD Model Number:  [id number]     ICD Serial Number:  [id number] ICD DOI:  08/10/2004     ICD Implanting MD:  Lewayne Bunting, MD  Lead 1:    Location: RV     DOI: 08/10/2004     Model #: [id number]     Serial #: [id number]     Status: active  Indications::  NICM   Episodes Coumadin:  Yes  Brady Parameters Mode VVI     Lower Rate Limit:  40      Tachy Zones VF:  230     VT:  200     VT1:  150  Impression & Recommendations:  Problem # 1:  SYSTOLIC HEART FAILURE, CHRONIC (ICD-428.22) Overall doing fairly well NYHA II-III depsite severe LV dysfunction. Volume status looks good. Increase coreg to 12.5 two times a day. WIll check labs and order repeat CPX testing.   Other Orders: CPX Test at [facility] (CPX Test) TLB-BMP (Basic Metabolic Panel-BMET) (80048-METABOL) TLB-CBC Platelet - w/Differential (85025-CBCD) TLB-BNP (B-Natriuretic Peptide) (83880-BNPR)  Patient Instructions: 1)  Increase Carvedilol to 12.5mg  two times a day  2)  Labs today 3)  Your physician has recommended that you have a cardiopulmonary stress test (CPX).  CPX testing is a non-invasive measurement of heart and lung function. It replaces a traditional treadmill stress test. This type of test provides a tremendous amount of information that relates not only to your present condition but also for future outcomes.  This  test combines measurements of your ventilation, respiratory gas exchange in the lungs, electrocardiogram (EKG), blood pressure and physical response before, during, and following an exercise protocol. 4)  Follow up in 1 months Prescriptions: CARVEDILOL 12.5 MG TABS (CARVEDILOL) Take one tablet by mouth twice a day  #60 x 6   Entered by:   Meredith Staggers, RN   Authorized by:   Dolores Patty, MD, [facility]   Signed by:   Meredith Staggers, RN on 02/28/2010   Method used:   [facility]  [address] [phone][facility] (retail)       [address]       [place], [place]  [address]       Ph: [phone] or [phone]       Fax: [phone]   RxID:   [id number]

## 2010-07-27 ENCOUNTER — Encounter: Payer: Self-pay | Admitting: Internal Medicine

## 2010-08-03 ENCOUNTER — Encounter (INDEPENDENT_AMBULATORY_CARE_PROVIDER_SITE_OTHER): Payer: Self-pay | Admitting: *Deleted

## 2010-08-06 DIAGNOSIS — I8290 Acute embolism and thrombosis of unspecified vein: Secondary | ICD-10-CM

## 2010-08-06 HISTORY — DX: Acute embolism and thrombosis of unspecified vein: I82.90

## 2010-08-09 NOTE — Letter (Signed)
Summary: Remote Device Check  [employer], Main Office  [address]   [place], [place] [address]   Phone: [phone]  Fax: [phone]     August 03, 2010 MRN: [id number]   [name] [address] [place], [place]  [address]   Dear Mr. DOOLY,   Your remote transmission was recieved and reviewed by your physician.  All diagnostics were within normal limits for you.  ___X__Your next transmission is scheduled for: 10/25/10.    Please transmit at any time this day.  If you have a wireless device your transmission will be sent automatically.  ______Your next office visit is scheduled for:                              . Please call our office to schedule an appointment.    Sincerely,  Altha Harm, LPN

## 2010-08-15 NOTE — Cardiovascular Report (Signed)
Summary: Office Visit Remote   Office Visit Remote   Imported By: Roderic Ovens 08/06/2010 10:09:33  _____________________________________________________________________  External Attachment:    Type:   Image     Comment:   External Document

## 2010-08-20 ENCOUNTER — Ambulatory Visit (INDEPENDENT_AMBULATORY_CARE_PROVIDER_SITE_OTHER): Payer: Medicare Other | Admitting: Internal Medicine

## 2010-08-20 ENCOUNTER — Encounter (INDEPENDENT_AMBULATORY_CARE_PROVIDER_SITE_OTHER): Payer: Medicare Other

## 2010-08-20 ENCOUNTER — Encounter: Payer: Self-pay | Admitting: Internal Medicine

## 2010-08-20 ENCOUNTER — Encounter: Payer: Self-pay | Admitting: Cardiology

## 2010-08-20 DIAGNOSIS — I82619 Acute embolism and thrombosis of superficial veins of unspecified upper extremity: Secondary | ICD-10-CM

## 2010-08-20 DIAGNOSIS — I5022 Chronic systolic (congestive) heart failure: Secondary | ICD-10-CM

## 2010-08-20 DIAGNOSIS — Z7901 Long term (current) use of anticoagulants: Secondary | ICD-10-CM

## 2010-08-27 ENCOUNTER — Other Ambulatory Visit: Payer: Medicare Other

## 2010-08-30 NOTE — Medication Information (Signed)
Summary: COUMADIN  Anticoagulant Therapy  Managed by: Bethena Midget, RN, BSN Referring MD: Shirlee Limerick PCP: Corwin Levins MD Supervising MD: [name] MD, Arlys [name] Indication 1: Other thrombosis (ICD-453.8) Lab Used: [facility] Site: [facility] INR POC 1.9 INR RANGE 2 - 3  Dietary changes: no    Health status changes: no    Bleeding/hemorrhagic complications: no    Recent/future hospitalizations: no    Any changes in medication regimen? no    Recent/future dental: no  Any missed doses?: yes     Details: missed yesterday dose  Is patient compliant with meds? yes       Allergies: No Known Drug Allergies  Anticoagulation Management History:      The patient is taking warfarin and comes in today for a routine follow up visit.  Negative risk factors for bleeding include an age less than 33 years old.  The bleeding index is 'low risk'.  Positive CHADS2 values include History of CHF.  Negative CHADS2 values include Age > 67 years old.  The start date was 11/24/2006.  His last INR was 5.3 RATIO.  Anticoagulation responsible provider: [name] MD, Arlys [name].  INR POC: 1.9.  Cuvette Lot#: [id number].  Exp: 06/2011.    Anticoagulation Management Assessment/Plan:      The patient's current anticoagulation dose is Coumadin 5 mg tabs: Take by mouth as directed.  The target INR is 2 - 3.  The next INR is due 08/31/2010.  Anticoagulation instructions were given to patient.  Results were reviewed/authorized by Bethena Midget, RN, BSN.  He was notified by Bethena Midget, RN, BSN.         Prior Anticoagulation Instructions: INR 1.9  Take 1 and 1/2 tablet today then resume regular schedule of 1 tablet everday. Recheck INR in 2 weeks.   Current Anticoagulation Instructions: INR 1.9 Today take 1.5 pills then resume 1 pill everyday. Recheck in 2 weeks.

## 2010-08-31 ENCOUNTER — Other Ambulatory Visit: Payer: Medicare Other

## 2010-08-31 ENCOUNTER — Other Ambulatory Visit (HOSPITAL_COMMUNITY): Payer: Medicare Other

## 2010-09-04 NOTE — Assessment & Plan Note (Signed)
Summary: per checkout/sf   Visit Type:  Follow-up Referring Provider:  Dr Gala Romney Primary Provider:  Corwin Levins MD  CC:  no complaints.  History of Present Illness: [name] is 50 y/o with severe CHF due to NICM EF 10-15%, severe MR previously on home milrinone. Was previously on transplant list but was taken off as he didn't respond to a page when a heart became available. Also has h/o mild CRI, NSVT s/p ICD and LV thrombus.    Had CPX 9/11: pVO2 23.3  Ve/VCo2 29  RER 1.22 O2 pulse 64%  Doing very well. No dyspnea or swelling. Reports compliance with all meds though said he ran out of carvedilol for a week and just got his refill from his pharmacy - now back on. No orthopnea, PND or edema. No ICD firings.  Weight up a few pounds.   Continues to smoke a few cigarettes per day.   Current Medications (verified): 1)  Digoxin 0.125 Mg Tabs (Digoxin) .... Take A   Tablet By Mouth Daily 2)  Carvedilol 12.5 Mg Tabs (Carvedilol) .... Take One Tablet By Mouth Twice A Day 3)  Allopurinol 100 Mg Tabs (Allopurinol) .... As Needed 4)  Lisinopril 10 Mg Tabs (Lisinopril) .... Take 1 Tablet By Mouth Two Times A Day 5)  Imdur 30 Mg Xr24h-Tab (Isosorbide Mononitrate) .... Take 1 Tablet By Two Times A Day 6)  Hydralazine Hcl 25 Mg Tabs (Hydralazine Hcl) .... Take Two  Tablets By Mouth Three Times A Day 7)  Coumadin 5 Mg Tabs (Warfarin Sodium) .... Take By Mouth As Directed 8)  Demadex 20 Mg Tabs (Torsemide) .... Two Times A Day 9)  Spironolactone 25 Mg Tabs (Spironolactone) .... Take 1/2 Ablet By Mouth Daily  Allergies (verified): No Known Drug Allergies  Past History:  Past Medical History: Last updated: 08/02/2008 1. Congestive heart failure secondary to nonischemic cardiomyopathy       with EF of 10-15%.   2. History of medical noncompliance.   3. Mitral regurgitation.   4. Tobacco use.   5. Hypertension.   6. Previous candidate for transplant list, but taken off due to   noncompliance.   7. History of home milrinone therapy several times for acute       decompensation.   8. History of depression.   9. History of nonsustained ventricular tachycardia status post AICD       implantation.   10.GERD.   11.History of nausea, vomiting, status post EGD for mild-to-moderate       distal gastritis in the setting of NSAID/Goody powder back in 2008.   12.History of substance abuse.  13. Migraines 14. Chronic renal insufficiency  Review of Systems       As per HPI and past medical history; otherwise all systems negative.   Vital Signs:  Patient profile:   50 year old male Height:      66 inches Weight:      150.50 pounds BMI:     24.38 Pulse rate:   74 / minute BP sitting:   106 / 72  (left arm) Cuff size:   regular  Vitals Entered By: Caralee Ates CMA (August 20, 2010 3:32 PM)  Physical Exam  General:  well appearing. no resp difficulty HEENT: normal Neck: supple. no obvious JVD. Carotids 2+ bilat; no bruits.  Cor: PMI laterally displaced. Regular rate & rhythm. No rubs. soft sem at apex. + s3 Lungs: clear Abdomen: soft, nontender, nondistended. No hepatosplenomegaly. No bruits or masses. Good  bowel sounds. Extremities: no cyanosis, clubbing, rash, edema Neuro: alert & orientedx3, cranial nerves grossly intact. moves all 4 extremities w/o difficulty. affect pleasant    ICD Specifications Following MD:  Lewayne Bunting, MD     Referring MD:  [facility] ICD Vendor:  [facility] [employer]     ICD Model Number:  [id number]     ICD Serial Number:  [id number] ICD DOI:  08/10/2004     ICD Implanting MD:  Lewayne Bunting, MD  Lead 1:    Location: RV     DOI: 08/10/2004     Model #: [id number]     Serial #: [id number]     Status: active  Indications::  NICM   Episodes Coumadin:  Yes  Brady Parameters Mode VVI     Lower Rate Limit:  40      Tachy Zones VF:  230     VT:  200     VT1:  150     Impression & Recommendations:  Problem # 1:  SYSTOLIC HEART FAILURE, CHRONIC  (ICD-428.22) Doing well despite severe LV dysfunction. NYHA II. Volume status looks good. Increase hydrlazine to 75 three times a day. Due for repeat echo and labs.   Orders: Echocardiogram (Echo)  Problem # 2:  TOBACCO ABUSE (ICD-305.1) Interested in quitting. Will give a script for Nicotine patches via [employer] program.   Patient Instructions: 1)  Increase Hydralazine to 50mg  1 & 1/2 tabs three times a day  2)  Your physician recommends that you return for lab work in: 1 week (bmet, bnp 428.22) 3)  Your physician has requested that you have an echocardiogram.  Echocardiography is a painless test that uses sound waves to create images of your heart. It provides your doctor with information about the size and shape of your heart and how well your heart's chambers and valves are working.  This procedure takes approximately one hour. There are no restrictions for this procedure.  Needs in 1 week 4)  Follow up in 2 months Prescriptions: LISINOPRIL 10 MG TABS (LISINOPRIL) Take 1 tablet by mouth two times a day  #60 x 6   Entered by:   Meredith Staggers, RN   Authorized by:   Dolores Patty, MD, [facility]   Signed by:   Meredith Staggers, RN on 08/20/2010   Method used:   [facility]  [address] [phone][facility] (retail)       [address]       [place], [place]  [id number]       Ph: [phone] or [phone]       Fax: [phone]   RxID:   [id number] HYDRALAZINE HCL 50 MG TABS (HYDRALAZINE HCL) 1 & 1/2 tabs three times a day  #150 x 6   Entered by:   Meredith Staggers, RN   Authorized by:   Dolores Patty, MD, [facility]   Signed by:   Meredith Staggers, RN on 08/20/2010   Method used:   [facility]  [address] [phone]* (retail)       [address]       [place], [place]  [id number]       Ph: [phone] or [phone]       Fax: [phone]   RxID:   [id number]

## 2010-09-06 ENCOUNTER — Encounter: Payer: Self-pay | Admitting: Internal Medicine

## 2010-09-11 NOTE — Letter (Signed)
Summary: Generic Letter  [employer] [employer], Main Office  [address]   [place], [place] [address]   Phone: [phone]  Fax: [phone]        September 06, 2010 MRN: [id number]    [name] [address] [place], [place]  [address]    Dear [name],  Our records indicate that you missed your appointment for your echocardiogram.  It is Dr Bensimhon's recommendation that you have this test done.  Please call our office at [phone] to reschedule.     Sincerely,  Meredith Staggers, RN Arvilla Meres, MD This letter has been electronically signed by your physician.

## 2010-10-04 LAB — COMPREHENSIVE METABOLIC PANEL
ALT: 19 U/L (ref 0–53)
ALT: 33 U/L (ref 0–53)
AST: 18 U/L (ref 0–37)
Alkaline Phosphatase: 65 U/L (ref 39–117)
BUN: 18 mg/dL (ref 6–23)
CO2: 25 mEq/L (ref 19–32)
CO2: 27 mEq/L (ref 19–32)
CO2: 27 mEq/L (ref 19–32)
Calcium: 8.3 mg/dL — ABNORMAL LOW (ref 8.4–10.5)
Calcium: 9.1 mg/dL (ref 8.4–10.5)
Chloride: 100 mEq/L (ref 96–112)
Creatinine, Ser: 1.43 mg/dL (ref 0.4–1.5)
Creatinine, Ser: 1.5 mg/dL (ref 0.4–1.5)
GFR calc Af Amer: >60 mL/min (ref >60)
GFR calc non Af Amer: 51 mL/min — ABNORMAL LOW (ref >60)
GFR calc non Af Amer: 53 mL/min — ABNORMAL LOW (ref >60)
GFR calc non Af Amer: >60 mL/min (ref >60)
Glucose, Bld: 107 mg/dL — ABNORMAL HIGH (ref 70–99)
Glucose, Bld: 108 mg/dL — ABNORMAL HIGH (ref 70–99)
Potassium: 3.6 mEq/L (ref 3.5–5.1)
Potassium: 4.1 mEq/L (ref 3.5–5.1)
Sodium: 133 mEq/L — ABNORMAL LOW (ref 135–145)
Sodium: 136 mEq/L (ref 135–145)
Total Bilirubin: 2.4 mg/dL — ABNORMAL HIGH (ref 0.3–1.2)

## 2010-10-04 LAB — BASIC METABOLIC PANEL
CO2: 24 mEq/L (ref 19–32)
CO2: 28 mEq/L (ref 19–32)
Calcium: 6.9 mg/dL — ABNORMAL LOW (ref 8.4–10.5)
Calcium: 8.6 mg/dL (ref 8.4–10.5)
Calcium: 8.7 mg/dL (ref 8.4–10.5)
Chloride: 106 mEq/L (ref 96–112)
Creatinine, Ser: 1.17 mg/dL (ref 0.4–1.5)
GFR calc Af Amer: >60 mL/min (ref >60)
GFR calc Af Amer: >60 mL/min (ref >60)
GFR calc non Af Amer: >60 mL/min (ref >60)
Glucose, Bld: 101 mg/dL — ABNORMAL HIGH (ref 70–99)
Glucose, Bld: 125 mg/dL — ABNORMAL HIGH (ref 70–99)
Potassium: 3.6 mEq/L (ref 3.5–5.1)
Potassium: 4.1 mEq/L (ref 3.5–5.1)
Sodium: 132 mEq/L — ABNORMAL LOW (ref 135–145)
Sodium: 135 mEq/L (ref 135–145)
Sodium: 135 mEq/L (ref 135–145)

## 2010-10-04 LAB — PROTIME-INR
INR: 1.8 — ABNORMAL HIGH (ref 0.00–1.49)
INR: 1.9 — ABNORMAL HIGH (ref 0.00–1.49)
Prothrombin Time: 21.5 seconds — ABNORMAL HIGH (ref 11.6–15.2)
Prothrombin Time: 23 seconds — ABNORMAL HIGH (ref 11.6–15.2)
Prothrombin Time: 23.3 seconds — ABNORMAL HIGH (ref 11.6–15.2)
Prothrombin Time: 26.5 seconds — ABNORMAL HIGH (ref 11.6–15.2)

## 2010-10-04 LAB — CBC
HCT: 43 % (ref 39.0–52.0)
HCT: 48.5 % (ref 39.0–52.0)
Hemoglobin: 14.9 g/dL (ref 13.0–17.0)
Hemoglobin: 15.4 g/dL (ref 13.0–17.0)
Hemoglobin: 16.7 g/dL (ref 13.0–17.0)
MCHC: 34.3 g/dL (ref 30.0–36.0)
MCV: 91.9 fL (ref 78.0–100.0)
MCV: 93.1 fL (ref 78.0–100.0)
RBC: 5.21 MIL/uL (ref 4.22–5.81)
RDW: 14.8 % (ref 11.5–15.5)
WBC: 7 10*3/uL (ref 4.0–10.5)

## 2010-10-04 LAB — POCT I-STAT 3, VENOUS BLOOD GAS (G3P V)
Acid-Base Excess: 4 mmol/L — ABNORMAL HIGH (ref 0.0–2.0)
Acid-Base Excess: 4 mmol/L — ABNORMAL HIGH (ref 0.0–2.0)
Bicarbonate: 27.4 mEq/L — ABNORMAL HIGH (ref 20.0–24.0)
Bicarbonate: 27.6 mEq/L — ABNORMAL HIGH (ref 20.0–24.0)
Bicarbonate: 27.9 mEq/L — ABNORMAL HIGH (ref 20.0–24.0)
O2 Saturation: 61 %
O2 Saturation: 63 %
TCO2: 29 mmol/L (ref 0–100)
pH, Ven: 7.513 — ABNORMAL HIGH (ref 7.250–7.300)
pO2, Ven: 29 mmHg — CL (ref 30.0–45.0)
pO2, Ven: 30 mmHg (ref 30.0–45.0)

## 2010-10-04 LAB — LIPASE, BLOOD: Lipase: 20 U/L (ref 11–59)

## 2010-10-04 LAB — TSH: TSH: 2.63 u[IU]/mL (ref 0.350–4.500)

## 2010-10-04 LAB — DIFFERENTIAL
Basophils Relative: 0 % (ref 0–1)
Eosinophils Absolute: 0.1 10*3/uL (ref 0.0–0.7)
Lymphs Abs: 1.6 10*3/uL (ref 0.7–4.0)
Monocytes Relative: 9 % (ref 3–12)
Neutro Abs: 3.6 10*3/uL (ref 1.7–7.7)
Neutrophils Relative %: 63 % (ref 43–77)

## 2010-10-04 LAB — MAGNESIUM: Magnesium: 2 mg/dL (ref 1.5–2.5)

## 2010-10-04 LAB — BRAIN NATRIURETIC PEPTIDE: Pro B Natriuretic peptide (BNP): 1062 pg/mL — ABNORMAL HIGH (ref 0.0–100.0)

## 2010-10-04 LAB — POCT CARDIAC MARKERS: Myoglobin, poc: 62.1 ng/mL (ref 12–200)

## 2010-10-06 ENCOUNTER — Encounter: Payer: Self-pay | Admitting: Internal Medicine

## 2010-10-09 ENCOUNTER — Ambulatory Visit: Payer: Medicare Other | Admitting: Internal Medicine

## 2010-10-16 ENCOUNTER — Encounter: Payer: Self-pay | Admitting: Internal Medicine

## 2010-10-25 ENCOUNTER — Ambulatory Visit (INDEPENDENT_AMBULATORY_CARE_PROVIDER_SITE_OTHER): Payer: Medicare Other | Admitting: *Deleted

## 2010-10-25 ENCOUNTER — Other Ambulatory Visit: Payer: Self-pay | Admitting: Internal Medicine

## 2010-10-25 DIAGNOSIS — I509 Heart failure, unspecified: Secondary | ICD-10-CM

## 2010-10-25 DIAGNOSIS — Z95810 Presence of automatic (implantable) cardiac defibrillator: Secondary | ICD-10-CM

## 2010-10-25 DIAGNOSIS — I428 Other cardiomyopathies: Secondary | ICD-10-CM

## 2010-11-01 ENCOUNTER — Encounter: Payer: Self-pay | Admitting: Internal Medicine

## 2010-11-01 NOTE — Progress Notes (Signed)
icd remote check  

## 2010-11-06 NOTE — Assessment & Plan Note (Signed)
[facility]                          CHRONIC HEART FAILURE NOTE   [name], [name]                         MRN:          [id number]  DATE:07/18/2008                            DOB:          Nov 19, 1960    PRIMARY CARDIOLOGIST:  Bevelyn Buckles. Bensimhon, MD   PRIMARY CARE PHYSICIAN:  Corwin Levins, MD   [name] returns today for further followup of his congestive heart failure  which is secondary to his nonischemic cardiomyopathy with EF of 10-15%  status post recent hospitalization.  [name] followed up with Dr.  Gala Romney post-hospitalization on July 05, 2008, at which time, Dr.  Gala Romney asked him to resume his lisinopril 5 mg a day and followup  here in 2 weeks.  [name] returns today states he has not resumed the  lisinopril, states he was not aware that he was supposed to.  He  otherwise states compliance with his medications.  He denies any  symptoms suggestive of volume overload.  Denies any presyncope, syncopal  episodes, lightheadedness, dizziness.  He states he does have  palpitations occasionally, but this is nothing new for him.  He denies  any shortness of breath at rest or with exertion.  Denies any orthopnea  or PND.   PAST MEDICAL HISTORY:  1. Congestive heart failure secondary to nonischemic cardiomyopathy      with EF of 10-15%.  2. History of medical noncompliance.  3. Mitral regurgitation.  4. Tobacco use.  5. Hypertension.  6. Previous candidate for transplant list, but taken off due to      noncompliance.  7. History of home milrinone therapy several times for acute      decompensation.  8. History of depression.  9. History of nonsustained ventricular tachycardia status post AICD      implantation.  10.GERD.  11.History of nausea, vomiting, status post EGD for mild-to-moderate      distal gastritis in the setting of NSAID/Goody powder back in 2008.  12.History of substance abuse.   REVIEW OF SYSTEMS:  As stated above.   CURRENT MEDICATIONS:  1. Magnesium oxide 400 mg b.i.d.  2. Coumadin as directed.  3. Demadex 20 mg b.i.d.  4. Digoxin 0.125 daily.  5. Coreg 3.125 b.i.d.  6. KCl 20 mEq b.i.d.  7. Zocor 10 mg daily.   P.r.n. medications include allopurinol.   PHYSICAL EXAMINATION:  VITAL SIGNS:  Weight 136 pounds.  Weight is down  4 pounds.  Blood pressure 112/80, heart rate 95.  GENERAL:  [name] is in no acute distress.  NECK:  No signs of jugular vein distention at 45-degree angle.  LUNGS:  Clear to auscultation bilaterally.  CARDIOVASCULAR:  Reveals an S1 and an S2.  Prominent S3 gallop noted.  ABDOMEN:  Soft, nontender, positive bowel sounds.  LOWER EXTREMITIES:  Without clubbing, cyanosis, or edema.  NEUROLOGIC:  Alert and oriented x3.   IMPRESSION:  Congestive heart failure secondary to nonischemic  cardiomyopathy.  Devereaux has improved today in regards to his recent  hospitalization.  Does not appear to be in volume overload at this time,  although he  did not start ACE inhibitor as instructed by Dr. Gala Romney  last visit.  We will go ahead and have him start lisinopril 5 mg daily  and have him return next week for blood work and I will plan on seeing  him back in another week or so.      Dorian Pod, ACNP  Electronically Signed      Bevelyn Buckles. Bensimhon, MD  Electronically Signed   MB/MedQ  DD: 07/18/2008  DT: 07/19/2008  Job #: [id number]

## 2010-11-06 NOTE — H&P (Signed)
[name], [name] NO.:  [id number]   MEDICAL RECORD NO.:  [id number]          PATIENT TYPE:  EMS   LOCATION:  [facility]                         FACILITY:  [facility]   PHYSICIAN:  Vernice Jefferson, MD          DATE OF BIRTH:  1960/08/26   DATE OF ADMISSION:  04/15/2008  DATE OF DISCHARGE:                              HISTORY & PHYSICAL   PRIMARY CARDIOLOGIST:  Bevelyn Buckles. Bensimhon, MD   CHIEF COMPLAINT:  Shortness of breath, x5 days.   HISTORY OF PRESENTING ILLNESS:  The patient is a 50 year old African  American male with a history of nonischemic cardiomyopathy (EF of  approximately 15-20%), with long history of medication noncompliance,  last admitted in February for a similar complaint.  The patient reports  that he ran out of his meds approximately 7-10 days ago including Lasix  and has had a general worsening dyspnea on exertion, paroxysmal  nocturnal dyspnea, lower extremity edema, and some mild chest discomfort  upon exertional activities.  He reports these symptoms are what he  normally feels, when he has too much fluid on board.  Does not know  how much weight he has gained over the past week or so, may be close to  5-10 pounds he says.  Currently, he is feeling better after receiving a  nitroglycerin and IV Lasix here in the ED.  He has no chronic complaints  other than his baseline shortness of breath.   PAST MEDICAL HISTORY:  1. Nonischemic cardiomyopathy, EF of 15-20%.  2. Status post ICD.  3. Chronic renal insufficiency.  4. History of substance abuse.  5. History of medication noncompliance.   SOCIAL HISTORY:  Lives in [place].  Denies any current alcohol or  tobacco use.  He does have a history of it in the past..   FAMILY HISTORY:  Reviewed noncontributory.  The patient had no medical  conditions.   MEDICATIONS:  1. Coumadin 5 mg daily.  2. Enalapril 10 mg b.i.d.  3. Hydralazine 25 mg t.i.d.  4. Imdur 30 mg once a day.  5. Digoxin.  6. Lasix  40 mg b.i.d.  7. Lovastatin 10 mg a day.  8. Spironolactone 25 mg a day.  9. Coreg 12.5 mg b.i.d.  Hopefully, he has not taken anything over the      past several days.   ALLERGIES:  No known drug allergies.   REVIEW OF SYSTEMS:  Pertinent and only for night sweats, which he has  been having over the past several months. Otherwise negative 11 point  ROS except for otherwise dictated in the above HPI.   PHYSICAL EXAMINATION:  VITAL SIGNS:  Blood pressure 122/75, heart rate  of 112, and he is afebrile.  GENERAL:  Well-developed and well-nourished white male in no acute  distress.  HEENT:  Moist mucous membranes.  No scleral icterus.  Conjunctival  pallor.  NECK:  Supple with full range of motion.  Jugular venous pressure  estimated at approximately 15 cm of water.  CARDIOVASCULAR:  Regular rate and rhythm.  No murmurs, rubs, or gallops.  CHEST:  Mild rales at the left base.  ABDOMEN:  Soft, nontender, and nondistended.  Normoactive bowel sounds.  EXTREMITIES:  Trace lower extremity edema.  Pulses 2+ bilaterally.  NEURO:  Alert and oriented x2 and no focal neurologic deficits.   MEDICAL DECISION MAKING:  A chest x-ray demonstrates moderate pulmonary  vascular congestion.  EKG, sinus tachycardia with left atrial  enlargement, poor R-wave progression, and left ventricular hypertrophy  with repolarization abnormality that is unchanged to prior ECG.   LABORATORY DATA:  Significant for hemoglobin 14.7, BUN and creatinine 11  and 1.4.  His cardiac biomarkers are within normal limits and his  coagulation studies are significant for an INR 1.2.   IMPRESSION:  1. Acute on chronic left ventricular systolic dysfunction.  2. Nonischemic cardiomyopathy with ejection fraction of 15-20%.  3. Chronic renal insufficiency.  4. Medication noncompliance.   Admit the patient to the hospital under telemetry monitoring.  We will  initiate IV Lasix 80 mg q.12 h. and restart his home medications from   after load reduction.  I do not expect him to be in hospital too long as  he is not grossly volume overloaded.  Additionally, given his night  sweat history, we may check an HIV as I do not see that has been not  performed in the computer before and he does have some risk factors.      Vernice Jefferson, MD  Electronically Signed     JT/MEDQ  D:  04/15/2008  T:  04/16/2008  Job:  [id number]

## 2010-11-06 NOTE — Discharge Summary (Signed)
NAMEJADIAN, [name] NO.:  [id number]   MEDICAL RECORD NO.:  [id number]          PATIENT TYPE:  INP   LOCATION:  2919                         FACILITY:  [facility]   PHYSICIAN:  Doylene Canning. Ladona Ridgel, MD    DATE OF BIRTH:  Jul 24, 1960   DATE OF ADMISSION:  08/22/2008  DATE OF DISCHARGE:  08/27/2008                               DISCHARGE SUMMARY   PRIMARY CARDIOLOGIST:  Bevelyn Buckles. Bensimhon, MD   DISCHARGE DIAGNOSES:  1. Acute/chronic systolic heart failure.      a.     Treated with intravenous milrinone, to be continued       following discharge.      b.     Class IV heart failure, improved to class III on IV       milrinone.   SECONDARY DIAGNOSES:  1. Severe nonischemic cardiomyopathy.      a.     EF approximately 40%.  2. Chronic systolic heart failure.  3. History of nonsustained ventricular tachycardia.      a.     Status post Guidant ICD implantation.  4. History of LV thrombus, on chronic Coumadin.  5. History of medication noncompliance.  6. Moderate mitral regurgitation.  7. History of prior hypertension.  8. History of tobacco abuse.   PROCEDURES:  Right heart catheterization with IV milrinone infusion, by  Dr. Verne Carrow, March 5.   REASON FOR ADMISSION:  [name] is a 50 year old male, well known to  our group with history of severe nonischemic cardiomyopathy, followed in  [facility].  He returned to the  hospital with complaint of worsening shortness of breath, and was  admitted for further evaluation and management.   HOSPITAL COURSE:  The patient was followed by his primary cardiologist,  Dr. Gala Romney, during his brief stay, with recommendation to see if  addition of intravenous milrinone would provide symptomatic relief.  He  remained hemodynamically stable with this, with some symptomatic  improvement.  Dr. Gala Romney then recommended temporary cessation of  milrinone on hospital day #3, to see how  he responds over the following  24-48 hours.  However, the patient complained of fatigue and Dr.  Gala Romney concluded that he had failed milrinone wean.  He also noted an  increase in his heart rate, in response to his low output failure state.  Dr. Gala Romney thus recommended proceeding with right heart  catheterization, with IV milrinone infusion, which was performed the  following day, by Dr. Clifton James.  Dr. Clifton James noted significant  improvement in his wedge pressure and cardiac index, during infusion.  Recommendation, therefore, was to proceed with arrangements for  intravenous milrinone to be continued at home.  This was arranged  through advanced home care, prior to discharge the following day.   The patient was cleared for discharge on hospital day #5, in  hemodynamically stable condition.   MEDICATION ADJUSTMENTS ON THIS ADMISSION:  Initiation of intravenous  milrinone at 0.375 mcg/kg/minute, to be continued at home.  Addition of  Imdur and hydralazine, as well.   DISCHARGE MEDICATIONS:  1. Intravenous  milrinone 0.375 mg/kg/minute, continuous infusion.  2. Coumadin 7.5 mg daily.  3. Demadex 20 mg b.i.d.  4. Digoxin 0.125 mg daily.  5. Coreg 3.125 mg b.i.d.  6. Zocor 10 mg nightly.  7. Mag-Ox 400 mg b.i.d.  8. Lisinopril 5 mg daily.  9. K-Dur 40 mEq daily.  10.Isosorbide mononitrate 30 mg daily.  11.Hydralazine 12.5 mg t.i.d.   INSTRUCTIONS:  1. [name] is to follow up with Dr. Arvilla Meres or Dr.      Dorian Pod, in the Heart Failure Clinic in the following 2      weeks.  Arrangements to be made through our office.  2. Recommend followup pro time and BMET on Tuesday, March 9, for close      monitoring of Coumadin dosing, as well as electrolytes and renal      function.   DISCHARGE LABORATORY DATA:  Normal CBC.  INR 1.8.  Sodium 135, potassium  4.3, BUN 12, creatinine 1.2, glucose 101.  Notable Labs:  BNP 1062, pre  discharge.  Albumin 2.7, mildly elevated  total bilirubin 1.5, INR 2.1 on  admission.   Admission Chest X-ray:  No acute cardiopulmonary abnormalities.   Discharge Encounter:  Greater than 30 minutes, including physician time.      Gene Serpe, PA-C      Doylene Canning. Ladona Ridgel, MD  Electronically Signed    GS/MEDQ  D:  08/27/2008  T:  08/28/2008  Job:  [id number]

## 2010-11-06 NOTE — Assessment & Plan Note (Signed)
[facility] HEALTHCARE                            CARDIOLOGY OFFICE NOTE   [name], [name]                         MRN:          [id number]  DATE:02/17/2007                            DOB:          07-25-1960    PRIMARY CARE PHYSICIAN:  Eric Levins, MD   INTERVAL HISTORY:  [name] is a 50 year old male with history of  congestive heart failure secondary to severe nonischemic cardiomyopathy  with an EF of 10-15%.  He is maintained on Milrinone.  He also has an LV  thrombus for which he has been on Coumadin.  He returns today for a  routine follow up.  He as recently seen at [facility] in the Transplant Clinic  and underwent right heart catheterization.  This showed right atrial  pressure of 7, PA pressure of 50/23 with a wedge pressure of 19.  Cardiac index was 2.4 liters/min/m2.  He says he feels very good.  He is  anxious to try to wean off the dopamine.  He denies any shortness of  breath.  No lower extremity edema. No orthopnea.  No PND.  He has not  had any firing of his defibrillator.  Unfortunately, he does continue to  smoke and seems to be mostly limited by the fact that he is having a  hard time getting cigarettes.  He says his last cigarettes was four days  ago.   CURRENT MEDICATIONS:  1. Magnesium over-the-counter.  2. Coumadin.  3. Enalapril 10 b.i.d.  4. Hydralazine 25 t.i.d.  5. Imdur 30 daily.  6. Digoxin 0.25 daily.  7. Lasix 40 b.i.d.  8. Lovastatin 10 daily.  9. Spironolactone 25 daily.  10.Milrinone 0.5 mg per hour.  11.Coreg 12.5 b.i.d.   PHYSICAL EXAMINATION:  GENERAL:  He is a well-appearing, no acute  distress.  He ambulates around the clinic without any respiratory  difficulty.  VITAL SIGNS:  Blood pressure 118/64, heart rate 62, weight 123.  HEENT:  Normal.  NECK:  Supple.  No JVD.  Carotids are 2+ bilaterally without any bruits.  There is no lymphadenopathy or thyromegaly.  CARDIAC:  PMI is nondisplaced.  He has a palpable  S3.  Otherwise,  regular with very soft ejection murmur at the apex.  LUNGS:  Clear.  ABDOMEN:  Soft, nontender, nondistended.  No hepatosplenomegaly.  No  bruits or masses.  EXTREMITIES:  Warm with no cyanosis, clubbing or edema.  Good pulses.  No rash.  NEUROLOGICAL:  Alert and oriented x3.  Cranial nerves II-XII intact.  Moves all four extremities without difficulty.  Affect is pleasant.   ASSESSMENT/PLAN:  1. Congestive heart failure.  He is much improved.  Given his recent      numbers, I do think we can wean his milrinone down to 0.375      mcg/kg/min.  Apparently, he is still being discussed for the      transplant list at [facility], and we will await to hear their final      evaluation.  Hopefully, we can continue to wean off his Milrinone  and try to increase his ACE inhibitors beta blockers very slowly.      We will not make any changes today as we wean his Milrinone.  2. Cigarette smoking.  I once again emphasized the need for him to      quit completely if he has any chance of being eligible for      transplant.  3. Left ventricular apical clot.  This was resolved on his most recent      echocardiogram.  We will continue Coumadin for now.   DISPOSITION:  We will see him back in clinic in one month for routine  follow up.     Eric Buckles. Bensimhon, MD  Electronically Signed    DRB/MedQ  DD: 02/17/2007  DT: 02/18/2007  Job #: [id number]

## 2010-11-06 NOTE — Assessment & Plan Note (Signed)
[facility] HEALTHCARE                            CARDIOLOGY OFFICE NOTE   [name], [name]                         MRN:          [id number]  DATE:07/09/2007                            DOB:          06-22-1961    PRIMARY CARE PHYSICIAN:  Corwin Levins, M.D.   INTERVAL HISTORY:  The patient is a 50 year old male with a history of  severe congestive heart failure secondary to a nonischemic  cardiomyopathy, ejection fraction of approximately 15-20%.  He is status  post ICD.  He also has a history of mitral regurgitation, tobacco use,  and hypertension.  He was previously on the transplant list, but was  taken off due to noncompliance.  He was also treated with home milrinone  several times and now he has been weaned off of that.   He comes in today for routine follow-up.  He says he has not been  feeling so well recently.  He has still been able to do all of his  activities of daily living, but he has been a little bit more sluggish.  He unfortunately due to financial reasons has not been taking all of his  medications and has been out of his Lasix, digoxin, IMDUR, and one or  two other medications which he cannot name.  He is smoking just  minimally and says his last cigarette was New Year's Eve.  He denies any  orthopnea, no PND, and no lower extremity edema.   CURRENT MEDICATIONS:  Scheduled medications:  1. Magnesium oxide 400 mg b.i.d.  2. Coumadin.  3. Enalapril 10 mg b.i.d.  4. Hydralazine 25 mg t.i.d.  5. IMDUR 30 mg a day.  6. Digoxin 0.25 mg a day.  7. Lasix 40 mg a day.  8. Lovastatin 10 mg a day.  9. Spironolactone 25 mg a day.  10.Coreg 12.5 mg a day.   PHYSICAL EXAMINATION:  GENERAL:  He is in no acute distress.  He  ambulates around the clinic without any respiratory difficulty.  VITAL SIGNS:  Blood pressure 120/100. Heart rate is 86, weight is 133  which is up five pounds.  HEENT:  Normal.  NECK:  Supple with no significant JVD.   Carotids are 2+ bilaterally  without bruits.  There is no lymphadenopathy or thyromegaly.  HEART:  His PMI is diffuse and laterally displaced.  He is regular.  He  has 2-3/6 mitral regurgitation murmur at the apex as well as a prominent  and palpable S3.  LUNGS:  Clear.  ABDOMEN:  Soft, nontender, and nondistended.  There is no significant  hepatosplenomegaly.  No bruits and no masses.  Good bowel sounds.  EXTREMITIES:  Warm with no cyanosis, clubbing, or edema.  No rash.  NEUROLOGY:  Alert and oriented x3.  Cranial nerves II-XII grossly  intact.  Moves all four extremities without difficulty.  Affect is  pleasant.   ASSESSMENT:  Congestive heart failure secondary to severe nonischemic  cardiomyopathy.  He is in the early stages of decompensation.  He is  NYHA class III to IIIB.  I have stressed to  him the absolute need to get  back on his medications, particularly in the short term his Lasix.  We  have referred him to [facility] to see if they can help him with his  medications.  I will see him back in 2-3 weeks to see how he is doing.  I told him to call me sooner if his symptoms are worsening.  We will  screen him today for possible enrollment in the Pravem trial.     Bevelyn Buckles. Bensimhon, MD  Electronically Signed    DRB/MedQ  DD: 07/09/2007  DT: 07/09/2007  Job #: [id number]

## 2010-11-06 NOTE — Assessment & Plan Note (Signed)
[name] HEALTHCARE                            CARDIOLOGY OFFICE NOTE   [name], [name]                         MRN:          [id number]  DATE:06/09/2007                            DOB:          May 12, 1961    PRIMARY CARE PHYSICIAN:  [name], M.D.   INTERVAL HISTORY:  [name] is a 50 year old male with a history of severe  congestive heart failure, secondary to non-ischemic cardiomyopathy.  Ejection fraction is about 20%.  He also has a history of mitral  regurgitation.  He returns today for routine followup.  He has  previously been on milrinone, as well as on the transplant list, but is  now no longer taking milrinone, as he has been doing okay.  He has been  taken off the transplant list due to noncompliance.   From a cardiovascular point of view, he says he is doing quite well.  He  says he is active, walking, without any significant dyspnea, no lower  extremity edema.  He has not had any chest pain.  Unfortunately, he  continues to smoke.  He has not been on milrinone for some time now,  probably at least a month.  He is interested in getting his Port-A-Cath  out.  He is very distraught about his financial situation and is  potentially going to be evicted from his apartment.   CURRENT MEDICATIONS:  1. Coumadin.  2. Magnesium 400 b.i.d.  3. Enalapril 10 b.i.d.  4. Hydralazine 25 t.i.d.  5. Imdur 30 a day.  6. Digoxin 0.25 a day.  7. Lasix 40 b.i.d.  8. Lovastatin 10 a day.  9. Spironolactone 25 a day.  10.Coreg 12.5 b.i.d.   PHYSICAL EXAM:  He is somewhat tearful, but in no acute distress.  He  ambulates around the clinic without any respiratory difficulty.  Blood pressure is 112/80, heart rate is 88, weight is 128.  HEENT:  Normal.  NECK:  Supple, no JVD.  Carotids are 2+ bilaterally, without any bruits.  There is no lymphadenopathy or thyromegaly.  CARDIAC:  The PMI is laterally displaced.  He is regular with no murmur.  He has an  S3.  LUNGS:  Clear.  ABDOMEN:  Soft, nontender, nondistended.  No hepatosplenomegaly, no  bruits, no masses, good bowel sounds.  EXTREMITIES:  Warm with no cyanosis, clubbing or edema.  No rash.  NEUROLOGIC:  Alert and oriented times three.  Cranial nerves II through  XII are intact.  Affect is sad.   ASSESSMENT AND PLAN:  1. Congestive heart failure, secondary to non-ischemic cardiomyopathy.      His LV function is severely depressed, but he is doing quite well.      I would put him at NYHA functional class 1-2.  We will continue      current therapy.  We will not titrate at this point.  He is      interested in getting his Port-A-Cath out and we will try to      arrange this with [name].  I will see him back in  one month to attempt to titrate some more of his drugs.  2. Tobacco use.  Once again, I reminded him of the need to stop      smoking.     [name]. Bensimhon, MD  Electronically Signed    DRB/MedQ  DD: 06/09/2007  DT: 06/10/2007  Job #: [id number]   cc:   Eric Levins, MD

## 2010-11-06 NOTE — H&P (Signed)
[name], [name] NO.:  [id number]   MEDICAL RECORD NO.:  [id number]          PATIENT TYPE:  INP   LOCATION:  1853                         FACILITY:  [facility]   PHYSICIAN:  Bevelyn Buckles. Bensimhon, MDDATE OF BIRTH:  04-Feb-1961   DATE OF ADMISSION:  11/14/2006  DATE OF DISCHARGE:                              HISTORY & PHYSICAL   REASON FOR ADMISSION:  Acute on chronic systolic heart failure.   PRIMARY CARDIOLOGIST:  Dr. Dietrich Pates.   HISTORY OF PRESENT ILLNESS:  [name] is a 50 year old male with a  history of severe congestive heart failure secondary to nonischemic  cardiomyopathy with an EF of 20-25%.  He was previously on the  transplant list at [facility], however, was recently taken off as he was not  available by telephone when a donor heart became available.   He presented to the office today for an unscheduled visit due to a 1  month history of progressive dyspnea on exertion to the point where he  could only walk about 20 feet.  He also has orthopnea and PND and gets  very lightheaded when standing.  He was seen in the emergency room last  night.  At that time his BNP was approximately 2500, he also had  evidence of renal dysfunction with a creatinine that was 1.9, which was  up from his baseline of 1.6-1.7.  He was given one dose of Lasix and  discharged home.  He returns today with recurrent symptoms.  He says he  feels quite weak.  He has also had difficulty eating and unable to hold  anything down.  He has had mild lower extremity edema, but this has not  been overly prominent.  He denies any firings from his defibrillator.   REVIEW OF SYSTEMS:  He denies any neurologic findings.  No fevers,  chills, does have occasional nausea, no diarrhea, no rash.  Remainder of  the review of systems is negative except for HPI and problem list.   PROBLEM LIST:  1. Congestive heart failure secondary to severe nonischemic      cardiomyopathy.      a.     Most recent  echocardiogram was on May 08, 2007, which       showed an ejection fraction of 20-25%, there was also moderate       mitral regurgitation.  There was also evidence of right       ventricular dysfunction.  2. Moderate mitral regurgitation as above.  3. History of nonsustained ventricular tachycardia in the setting of      syncope, status post single chamber implantable cardioverter-      defibrillator.  4. Chronic renal insufficiency, baseline creatinine around 1.7.  5. Gastroesophageal reflux disease.  6. Hypertension.  7. Noncompliance.   CURRENT MEDICATIONS:  1. Enalapril 5 mg b.i.d.  2. Coreg 12.5 b.i.d.  3. Digoxin 0.125 mg a day.  4. Magnesium 400 mg b.i.d.  5. Lasix 80 mg in the morning and 40 at night.  6. Aspirin 325 b.i.d.   SOCIAL HISTORY:  [name] has a history of  tobacco and alcohol use but  denies any current use.   FAMILY HISTORY:  There is no history of premature coronary artery  disease.   PHYSICAL EXAM:  He is ambulatory in the clinic slowly without any  significant respiratory distress.  Blood pressure is hard to auscultate  but it seems to be at 105/80, heart rate is 102, his weight is 140,  which is just several pounds up from his previous weight of 136-138.  HEENT:  Normal.  NECK:  Supple.  Hard to see his JVD, but appears to be elevated to the  angle of the jaw.  Carotids are 2+ bilaterally without bruits.  There is  no lymphadenopathy or thyromegaly.  CARDIAC:  His PMI is widely displaced.  He has a palpable S3.  He has a  3/6 mitral regurgitation murmur.  LUNGS:  Clear.  ABDOMEN:  Soft, nontender, nondistended.  There is no evidence of  hepatosplenomegaly, he does not have a pulsatile liver.  Good bowel  sounds.  Nontender.  EXTREMITIES:  Are warm centrally but mildly cool distally.  There is  about 1+ edema just around his ankles.  No cyanosis or clubbing.  No  rash.  NEURO:  He is alert and oriented x3, cranial nerves II-XII are intact.   Moves all 4 extremities without difficulty.  Affect is pleasant.   EKG shows sinus tachycardia, a rate of 111 with repolarization  abnormalities.  Also appears to be a previous lateral infarct which is  just slightly different from previous.   ASSESSMENT:  1. Acute on chronic systolic heart failure secondary to nonischemic      cardiomyopathy with symptoms of low output.  2. Hypertension.  3. Medication noncompliance.   PLAN DISCUSSION:  At this point we will admit [name] for his heart  failure.  We will diurese him and start him on a little bit of milrinone  for inotropic support.  Will continue his beta-blocker for now but may  need to cut it back some in the future.  May need to consider a possible  biventricular device should he have evidence of dyssynchrony.      Bevelyn Buckles. Bensimhon, MD  Electronically Signed     DRB/MEDQ  D:  11/14/2006  T:  11/14/2006  Job:  [id number]

## 2010-11-06 NOTE — Discharge Summary (Signed)
NAMEMARQUISE, WICKE NO.:  [id number]   MEDICAL RECORD NO.:  [id number]          PATIENT TYPE:  INP   LOCATION:  3735                         FACILITY:  [facility]   PHYSICIAN:  Bevelyn Buckles. Bensimhon, MDDATE OF BIRTH:  20-Feb-1961   DATE OF ADMISSION:  04/15/2008  DATE OF DISCHARGE:  04/18/2008                               DISCHARGE SUMMARY   PRIMARY CARDIOLOGIST:  Bevelyn Buckles. Bensimhon, MD   PRIMARY CARE Hildy Nicholl:  [facility].   DISCHARGE DIAGNOSIS:  Acute on chronic systolic congestive heart  failure.   SECONDARY DIAGNOSES:  1. 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  2. Mitral regurgitation.  3. Medication nonadherence.  4. History of nonsustained ventricular tachycardia.  5. History of syncope status post single chamber automatic implantable      cardioverter-defibrillator.  6. Chronic renal insufficiency.  7. Gastroesophageal reflux disease.  8. Remote tobacco and alcohol abuse.  9. 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   ALLERGIES:  No known drug allergies.   PROCEDURES:  None.   HISTORY OF PRESENT ILLNESS:  A 50 year old African American male with  the above problem list, who ran out of his medications approximately 7-  10 days prior to admission and subsequently developed worsening dyspnea,  PND, lower extremity edema, and mild chest discomfort with activity.  Because of progression of these symptoms, he presented to the [facility]  ED on April 15, 2008, where he was felt to be volume overloaded and  was treated with intravenous Lasix and IV nitroglycerin.  He did have  some diuresis in the ED, and he was subsequently admitted for further  evaluation and management of acute on chronic systolic heart failure.   HOSPITAL COURSE:  The patient was aggressively diuresed with good  output, improvement in volume, and stable renal  function.  The patient  previously had been on Coumadin therapy for a history of left  ventricular thrombus; however, his INR on admission was only 1.2.  Coumadin was reinitiated as well as Lovenox.  The patient had previously  been on hydralazine and nitrates at home, and this was reinitiated  during his hospitalization.  The patient has requested discontinuation  of hydralazine and nitrate therapy despite its mortality benefit which  was explained to him.  His decision to discontinue nitrates was based on  him wanting to take Cialis at home.   Mr. Skoda has significant volume and clinical improvement and this  morning is ready for discharge.  With diuresis, he has developed pain in  his right large toe, and this was felt to be secondary to gout.  We are  providing him with a prescription for allopurinol at the time of  discharge.  He has been counseled on the importance of medication  compliance as well as daily weights and dietary restrictions.  As we  have reinitiated Coumadin therapy, we have established Coumadin Clinic  followup this Wednesday, April 20, 2008.  He will be seen in Heart  Failure  Clinic on April 26, 2008, and subsequently follow up with Dr.  Gala Romney on June 28, 2007.   DISCHARGE LABORATORY:  Hemoglobin 16.6, hematocrit 49.7, WBC 5.8, and  platelets 304.  INR 1.2.  Sodium 139, potassium 3.5, chloride 99, CO2  30, BUN 15, creatinine 0.31, glucose 102, calcium 9.0, and magnesium  2.3.  CK 127, MB 1.4, and troponin I 0.04.  BNP 532.0.  Digoxin less  than 0.2.  HIV was nonreactive.  Blood cultures were negative x2.   DISPOSITION:  The patient is being discharged home today in good  condition.   FOLLOWUP PLANS AND APPOINTMENTS:  The patient will follow up in [facility] Coumadin Clinic on Wednesday, April 20, 2008, 11:45 a.m.  He is to follow up with Dorian Pod in Heart Failure Clinic on  April 26, 2008, at 4 p.m.  Finally also see Dr.  Gala Romney on June 27, 2008, at 3:00 p.m. or sooner if necessary.   DISCHARGE MEDICATIONS:  1. Allopurinol 100 mg daily.  2. Enalapril 10 mg b.i.d.  3. Carvedilol 12.5 mg b.i.d.  4. Lasix 40 mg b.i.d.  5. Digitek 0.125 mg daily.  6. Lovastatin 10 mg nightly.  7. Spirolactone 25 mg daily.  8. K-Dur 40 mEq daily.  9. Coumadin 5 mg nightly.   OUTSTANDING LABORATORY STUDIES:  The patient will have an INR on April 20, 2008, in our Coumadin Clinic.   DURATION OF DISCHARGE ENCOUNTER:  Forty five minutes including physician  time.      Nicolasa Ducking, ANP      Bevelyn Buckles. Bensimhon, MD  Electronically Signed    CB/MEDQ  D:  04/18/2008  T:  04/18/2008  Job:  [id number]   cc:   Dala Dock

## 2010-11-06 NOTE — H&P (Signed)
[name], [name] NO.:  [id number]   MEDICAL RECORD NO.:  [id number]          PATIENT TYPE:  INP   LOCATION:  2010                         FACILITY:  [facility]   PHYSICIAN:  Madolyn Frieze. [name], MD, FACCDATE OF BIRTH:  1960-09-15   DATE OF ADMISSION:  06/21/2008  DATE OF DISCHARGE:                              HISTORY & PHYSICAL   PRIMARY CARDIOLOGIST:  Bevelyn Buckles. 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  He is status post implantation of a  single chamber Guidant ICD a few years ago, also previously evaluated  for cardiac transplant.  However, was unable to remain compliant with  the request and was removed from the list.  He presents to [facility]  emergency room today complaining of 2 weeks of increased abdominal  distention with compliance to medications.  No change in diet; however,  he did later stated that he had a couple of beers on Christmas day.  He  states he tried to manage the heart failure at home adjusting his  Lasix, but it did not work.  Over the last week, he has complained of  increased abdominal fullness early Saturday, nausea, vomiting, diarrhea  with dry heaves.  Today, because of his above symptoms, he decided to  get evaluated.  He has denied any orthopnea or PND.  He does, however,  become extremely dyspneic with minimal exertion complaining of  generalized weakness and decreased urine output with Lasix.   PAST MEDICAL HISTORY:  1. Nonischemic cardiomyopathy EF of 15% status post Guidant ICD.  2. History of nonsustained V-tach.  3. Chronic renal insufficiency with creatinine around 1.7.  4. GERD.  5. EtOH abuse.  6. History of tobacco abuse.  7. Status post implantation of a Port-A-Cath in June 2008, for a home      milrinone  therapy.  8. History of left ventricular thrombus with chronic anticoagulation      therapy.  Note, however, the patient's last INR was on April 27, 2008.  He was supratherapeutic.  He has not followed up in the      Coumadin Clinic since that time.  9. Migraines.   ALLERGIES:  No known drug allergies.   MEDICATIONS:  1. Coreg 12.5 mg b.i.d.  2. Digitek 0.125.  3. Lasix 40 b.i.d.  4. Lisinopril 10 mg b.i.d.  5. Spironolactone 25 mg daily.  Note, the patient states he has not      taken the lisinopril and spironolactone for a few days.  6. Zocor 10 mg.  7. Allopurinol 100 mg.   SOCIAL HISTORY:  The patient lives in [place] alone.  He is disabled.  He is divorced.  He states he is only smoking just a couple of  cigarettes a day.  EtOH, states his last intake was on Christmas Day.  He had two beers, denied any drug or herbal medication use.  Is  compliant with the 2 g sodium restricted diet.   REVIEW OF SYSTEMS:  Positive for sweats, shortness of breath, dyspnea on  exertion, decreased urine output, generalized weakness, nausea,  vomiting, diarrhea, abdominal fullness, decreased appetite, and  abdominal tenderness.   PHYSICAL EXAMINATION:  VITAL SIGNS:  Temperature 97.2, heart rate 120,  now 90, respirations initially 24, now 18, blood pressure initially  87/64, currently 101/80.  GENERAL:  In no acute distress at rest, becomes very tachypneic with  minimal exertion.  HEENT:  Unremarkable.  NECK:  Without signs of jugular vein distention.  CARDIOVASCULAR:  S1 and S2, positive S3, tachycardic.  He has a Port-A-  Cath site to his right upper chest.  He has 2/6 systolic ejection  murmur.  LUNGS:  He has fine crackles in bilateral bases.  ABDOMEN:  Soft, positive tenderness to right upper quadrant, positive  bowel sounds.  Mildly distended.  EXTREMITIES:  Lower extremities without clubbing, cyanosis, or edema.  NEUROLOGICAL:  Alert and oriented x3.   Chest x-ray  results, findings include moderate cardiomegaly which is  stable, minimal pulmonary vascular congestion is noted.  Permanent  pacemaker remains with an ICD lead and Port-A-Cath is unchanged in  position.  EKG sinus tach at a rate of 115 with T-wave inversions in  lateral leads, unchanged from previous.  All lab work is pending as  blood specimen hemolyzed.   IMPRESSION:  1. Acute on chronic congestive heart failure exacerbation which is      secondary to nonischemic cardiomyopathy with EF 15-20% status post      Guidant ICD single chamber device in the setting of history of EtOH      abuse and medical noncompliance.  2. Sinus tachycardia.  3. Hypotension.  Dr. Olga Millers is to examine and assess the      patient.  We will need to review lab work and make adjustments in      medications as tolerated.  Gentle diuresis may require positive      inotropic therapy.  The patient will be admitted to telemetry,      continue to monitor closely.      Dorian Pod, ACNP      Madolyn Frieze. [name], MD, [facility]  Electronically Signed    MB/MEDQ  D:  06/21/2008  T:  06/22/2008  Job:  [id number]

## 2010-11-06 NOTE — Assessment & Plan Note (Signed)
[facility]                            CARDIOLOGY OFFICE NOTE   Eric, BOCOCK                         MRN:          [id number]  DATE:06/27/2008                            DOB:          [birth year]    INTERVAL HISTORY:  [name] is a 50 year old male with a history of  congestive heart failure secondary to his severe nonischemic  cardiomyopathy.  He also has history of nonsustained V-tach.  He is  status post Guidant ICD as well as chronic renal insufficiency, history  of LV thrombus, and he has been maintained on Coumadin.  He previously  was on the transplant list at [facility] and actually had an organ available  to him, but he has been subsequently taken off of the transplant list  due to noncompliance and lack of follow up.  He was previously  maintained on home IV ionotropes.   He was just discharged from the hospital after being admitted for 3 days  for volume overload from June 21, 2008, through June 23, 2008.  He has not filled all his prescriptions yet.   From a functional point of view, he is having a tough time, although he  says he can walk several blocks, but then gets short of breath.  He  notes that his abdomen has also been swelling.  He denies any lower  extremity edema.  He has not had any orthopnea or PND.  His weight is  elevated.  He denies any chest pain.   CURRENT MEDICATIONS:  1. Magnesium 400 b.i.d.  2. Demadex 20 b.i.d., which he has not started yet.  3. Digoxin 0.125 a day.  4. Coreg 3.125 b.i.d.  5. He was previously on enalapril 10 b.i.d., but this was changed to      lisinopril 5 mg a day in the hospital.  6. He is on potassium 20 b.i.d.  7. Simvastatin 10 a day.  8. Allopurinol 100 daily.   PHYSICAL EXAMINATION:  GENERAL:  He is in no acute distress.  He  ambulates around the clinic without any respiratory difficulty.  VITAL SIGNS:  Blood pressure is 88/60, his heart rate is 112, his weight  is 145, which is  up about 15 pounds for him.  HEENT:  Normal.  NECK:  Supple.  His JVP is elevated past the angle of the jaw.  Carotids  are 2+ bilaterally.  There is no obvious bruits.  There is no  lymphadenopathy or thyromegaly.  CARDIAC:  PMI is significantly laterally displaced.  He is quite  tachycardic.  He has summation gallop with a 2/6 systolic ejection  murmur at the apex.  LUNGS:  Clear.  ABDOMEN:  Mildly distended.  It is soft, nontender.  The liver edge is  down slightly in the midclavicular line.  He has hypoactive bowel  sounds.  EXTREMITIES:  Warm with no cyanosis, clubbing, or edema.  NEUROLOGIC:  He is alert and oriented x3.  Cranial nerves II through XII  are intact.  Moves all 4 extremities without difficulty.  Affect is  normal.   EKG shows sinus  tachycardia at a rate of 110.  There is left atrial  enlargement as well as LVH with diffuse repolarization abnormalities.   ASSESSMENT AND PLAN:  Congestive heart failure, secondary nonischemic  cardiomyopathy, currently is volume overloaded with NYHA class III  symptoms.  I am quite concerned about him, although he is ambulating  around the office fairly well, his cardiac exam is quite concerning for  decompensated state.  I have made it very clear to him that he needs to  be more compliant with his medication.  We have given him his  prescriptions today and he is leaving from the office to go fill them.  I told him that if he is getting any worse, he needs to report to the  hospital immediately for further evaluation and treatment.  I have  scheduled him to see me in an overbook slot next week to further  evaluate him and see how he is doing.  Unfortunately, our options for  him for advanced therapies are very limited due to his previous  noncompliance and the fact that he has been taken off the transplant  list twice due to his lack of follow up and noncompliance.  At some  point, we may have to consider him for a destination LVAD  or possibly  reinitiation of home inotropic therapy at least for a short course.     Eric Buckles. Bensimhon, MD  Electronically Signed    DRB/MedQ  DD: 06/27/2008  DT: 06/28/2008  Job #: [id number]

## 2010-11-06 NOTE — Assessment & Plan Note (Signed)
[facility]                            CARDIOLOGY OFFICE NOTE   EMITT, MAGLIONE                         MRN:          [id number]  DATE:01/19/2007                            DOB:          [birth year]    PRIMARY CARE PHYSICIAN:  Dr. Oliver Barre.   INTERVAL HISTORY:  Mr. Parenteau is a 50 year old male with a history of  congestive heart failure secondary to severe nonischemic cardiomyopathy  with an EF of 10-15%.  He is maintained on milrinone.  He had an LV  thrombus which he has been treated with Coumadin.  He returns today for  routine followup.   Since we last saw him he was evaluated at [facility] by the transplant team.  Unfortunately, he has missed a visit or two and also had very high  cotinine levels, suggestive of ongoing tobacco use.  He says he is only  smoking a couple cigarettes a week.  From a functional standpoint he is  doing much better.  He is able to do all his activities without  significant limitation.  He does get short of breath on hills.  He has  not noticed any changes in his weight.  He denies any orthopnea, PND or  lower extremity edema.   His ICD was interrogated today and showed multiple episodes of  nonsustained VT, some of which were treated with anti-tachycardial  pacing, he did not get shocked.   CURRENT MEDICATIONS:  1. Magnesium 400 b.i.d.  2. Coumadin.  3. Enalapril 10 b.i.d.  4. Hydralazine 25 t.i.d.  5. Imdur 30 a day.  6. Digoxin 0.25 a day.  7. Lasix 40 b.i.d.  8. Lovastatin 10 a day.  9. Spironolactone 25 a day.  10.Milrinone infusion at 0.5 mcg/kg per minute.  11.Coreg 12.5, which he is taking sometimes daily and other times      b.i.d.   PHYSICAL EXAM:  He is well-appearing, no acute distress.  He ambulates  around the clinic without any respiratory difficulty.  Blood pressure is  116/96, weight is 129, which is up about 5 pounds, his heart rate is 96.  HEENT:  Normal.  NECK:  Supple.  No JVD.  Carotids  are 2+ bilaterally without any bruits.  There is no lymphadenopathy or thyromegaly.  CARDIAC:  His PMI is laterally displaced, he is regular but mildly  tachycardic.  No S3 today.  He has a soft mitral regurgitation murmur at  the apex.  LUNGS:  Clear.  ABDOMEN:  Soft, nontender, nondistended.  There is no  hepatosplenomegaly, no bruits, no masses, good bowel sounds.  EXTREMITIES:  Warm with no cyanosis, clubbing or edema.  No rash.  NEURO:  He is alert and oriented x3, cranial nerves II-XII are intact,  moves all 4 extremities without difficulty.  Affect is pleasant.   ASSESSMENT AND PLAN:  Severe congestive heart failure secondary to  nonischemic cardiomyopathy.  Unfortunately, given his noncompliance with  his appointments and likely ongoing tobacco use, he has been refused or  turned down again for a transplant, we will continue on medical therapy.  In the past we have been able to wean off his milrinone, we may try to  do this slightly in the future.  Right now I have told him to continue  taking his Coreg twice a day and we will see him back in 1 month.     Bevelyn Buckles. Bensimhon, MD  Electronically Signed    DRB/MedQ  DD: 01/19/2007  DT: 01/19/2007  Job #: [id number]

## 2010-11-06 NOTE — Assessment & Plan Note (Signed)
[facility]                            CARDIOLOGY OFFICE NOTE   [name], [name]                         MRN:          [id number]  DATE:07/05/2008                            DOB:          05/10/1961    INTERVAL HISTORY:  [name] is a 50 year old male with a history of  congestive heart failure secondary to severe nonischemic cardiomyopathy,  ejection fraction of 10-15% range.  He also has a history of  nonsustained VT and status post Guidant ICD.  Remainder of the medical  history is notable for chronic renal insufficiency, history of LV  thrombus on Coumadin and noncompliance.  He previously was on the  transplant at [facility] and actually had an organ available to him, but he  had been essentially taken off the list due to noncompliance and lack of  followups.  We also treated him briefly at home with inotropes.   I saw him in the clinic last week for decompensated heart failure, he  was quite sick and volume overloaded.  We reinitiated some of his  medications and he comes today for followup.  He is starting to feel  better.  He has lost 5 pounds.  He notes that his breathing is better.  He is able now to walk several blocks on flat ground without any  problems.  He says however, when he gets on a hill, it is difficult for  him.  He does experience palpitations, but he has not had any syncope or  defibrillator firings.  He has been more compliant with his medications.  He also notes that he has been starting to have problems with erectile  dysfunction.  He has not had problems with orthopnea or PND.   CURRENT MEDICATIONS:  1. Simvastatin 10 a day.  2. Magnesium 400 b.i.d.  3. Coumadin.  4. Demadex 20 b.i.d.  5. Digoxin 0.125 a day.  6. Coreg 3.125 a day.  7. Potassium 20 b.i.d.   PHYSICAL EXAMINATION:  GENERAL:  He is in no acute distress.  Ambulates  around the clinic without any respiratory difficulty.  VITAL SIGNS:  Blood pressure is 98/76,  heart rate is 100 which is down  from 112 at the last visit.  HEENT:  Normal.  NECK:  Supple.  JVP is about 5-6 cm of water.  Carotids are 2+  bilaterally without any bruits.  There is no lymphadenopathy or  thyromegaly.  CARDIAC:  PMI is markedly displaced.  He is tachycardiac and regular.  He has a prominent S3 with a 2/6 mitral regurgitation murmur at the  apex.  LUNGS:  Clear.  ABDOMEN:  Soft, nontender, nondistended, mild hepatomegaly.  There is no  bruits or masses.  EXTREMITIES:  Warm with no cyanosis, clubbing, or edema.  No rash.  NEUROLOGIC:  Alert and oriented x3.  Cranial nerves II through XII are  intact.  Moves all four extremities without difficulty.  Affect is  pleasant.   EKG shows sinus tachycardia at a rate of 100 with LVH and repolarization  changes.   ASSESSMENT AND PLAN:  1. Congestive heart  failure secondary to nonischemic cardiomyopathy.      Remus still remains quite sick, but has improved since last week.      He is currently New [place] Heart Association class III.  Volume      status looks good.  Blood pressure is on the low side.  We will try      to add back low-dose ACE inhibitor, lisinopril 5 mg a day and see      how he tolerates that.  We will see him every 2 weeks with me or      either in the Heart Failure Clinic and continue to titrate his      medications as tolerated.  2. Nonsustained ventricular tachycardia.  He has had multiple episodes      on his device, but he has not had any therapies delivered.  He has      not needed any therapies.   DISPOSITION:  As above, we will see him back in the very near future.     Bevelyn Buckles. Bensimhon, MD  Electronically Signed    DRB/MedQ  DD: 07/05/2008  DT: 07/05/2008  Job #: [id number]

## 2010-11-06 NOTE — Assessment & Plan Note (Signed)
[facility]                         GASTROENTEROLOGY OFFICE NOTE   Eric, [name]                         MRN:          [id number]  DATE:11/04/2006                            DOB:          [birth year]    REFERRING PHYSICIAN:  Corwin Levins, MD   REASON FOR REFERRAL:  Dr. Jonny [name] asked me to evaluate [name] in  consultation regarding chronic nausea, vomiting, diarrhea.   HISTORY OF PRESENT ILLNESS:  [name] is a very pleasant 50 year old  man with significant cardiac disease, CHF (recent EF of 20% to 25%), who  has had nausea, vomiting for several months.  He describes vomiting and  nausea beginning around [place] this year.  He also has had looser than  usual stools.  He was recently admitted to the hospital with vomiting  and diarrhea, was found to be mildly renal insufficient, was hydrated,  improved quickly and was tolerating food by the end of his stay and was  sent home.  This was 1 month ago.  He continues to have rather chronic  nausea, vomiting with just about anything he eats, although  interestingly his weight has not changed in the past month or two that  this has been going on.  He will vomit at least 6 times a day, he says  eating or even smelling very pungent odors will cause him to vomit.  He  had pain at the onset of this, but the pain has definitely improved.  He  will have 3 to 4 loose stools on a daily basis.   Interestingly, he takes Goody powders at 2 a day approximately 4 times a  week.  He also takes Advil several times a week.  He will take at least  1 aspirin a day and sometimes 2 aspirin a day.   He had lab tests 2 to 3 weeks ago showing a normal hemoglobin, normal  platelets.  Liver tests were essentially normal.  Creatinine was 1.6.   REVIEW OF SYSTEMS:  Notable for stable weight, otherwise essentially  normal and is available on his nursing intake sheet.   PAST MEDICAL HISTORY:  1. Non-ischemic cardiomyopathy,  question alcohol induced.  Latest EF      20% to 25%.  2. Depression.  3. Chronic headaches.  4. Testicular surgery 20 years ago.  5. Problems sleeping.   CURRENT MEDICINES:  1. Enalapril.  2. Coreg.  3. Digitek.  4. Lasix.  5. Aspirin once to twice daily.  6. Magnesium.  7. Goody powders and Advil on a p.r.n. basis (at least several times a      week).   ALLERGIES:  No known drug allergies.   SOCIAL HISTORY:  Divorced.  Lives with his parents. Non-smoker, non-  drinker.  Quit drinking alcohol about a year ago.   FAMILY HISTORY:  No colon cancer or colon polyps in family.   PHYSICAL EXAMINATION:  Height 5 foot 5 inches, 138 pounds.  Blood  pressure 110/72, pulse 100.  CONSTITUTIONAL:  Generally well-appearing.  NEUROLOGICAL:  Alert and oriented x3.  EYES:  Extraocular movements intact.  MOUTH:  Oropharynx moist, no lesions.  NECK:  Supple, no lymphadenopathy.  CARDIOVASCULAR:  Heart regular rate and rhythm.  LUNGS:  Clear to auscultation bilaterally.  ABDOMEN:  Soft, nontender, nondistended.  Normal bowel sounds.  EXTREMITIES:  No lower extremity edema.  SKIN:  No rash or lesions on visible extremities.   ASSESSMENT/PLAN:  A 50 year old man with 1 to 2 months of daily  vomiting, takes several nonsteroidal anti-inflammatory drugs a week.   I am suspicious that he has NSAID-induced gastrointestinal  complications.  He takes [place] powders several times a week, Advil  several times a week and sometimes 2 aspirin a day.  I have told him to  cut back his NSAID and aspirin use to taking only 1 aspirin a day and  that would be for cardiac reasons only; other than that, he should take  Tylenol only for pain.  I have also given him samples for  proton pump  inhibitor and I have instructed him to take it 1 pill once daily shortly  before his breakfast meal or lunch meal if he does not eat breakfast  that day.  We will also arrange for him to have an EGD at his soonest   convenience.  Lastly, he will get a basic set of labs including a CBC, a  complete metabolic profile, thyroid testing as well as amylase and  lipase.  We did not really address his loose stools today.  I will get  back to that after hopefully resolving some of these upper GI issues.     Eric Fee, MD  Electronically Signed    DPJ/MedQ  DD: 11/04/2006  DT: 11/04/2006  Job #: [id number]   cc:   Eric Levins, MD

## 2010-11-06 NOTE — H&P (Signed)
NAMEJIM, LUNDIN NO.:  [id number]   MEDICAL RECORD NO.:  [id number]          PATIENT TYPE:  OBV   LOCATION:  6531                         FACILITY:  [facility]   PHYSICIAN:  Noralyn Pick. Eden Emms, MD, FACCDATE OF BIRTH:  [birth year]   DATE OF ADMISSION:  08/18/2007  DATE OF DISCHARGE:                              HISTORY & PHYSICAL   PRIMARY CARDIOLOGIST:  Pricilla Riffle, MD, [facility].  The patient is also  followed in the heart failure clinic.  Primary care used to be Dr. Oliver Barre but the patient states he has been followed in [facility].   HISTORY:  Mr. Vanwyk is a very pleasant 50 year old African American  gentleman with a history of severe congestive heart failure secondary to  nonischemic cardiomyopathy with an EF previously 15-20% confirmed by  echocardiogram.  He is also status post ICD implant.  He was previously  on the transplant list but was taken off due to noncompliance.  He has  actually been a class 3B New York Heart Association symptoms in the past  and has been maintained on home milrinone therapy several times but over  the last few months has done amazingly well and has been compliant with  medications.  He presents to [facility] emergency room today complaining  of early symptoms of volume overload secondary to being out of  medications.  He states that he used up his last Lasix.  He had to  double on them for a few days secondary to some abdominal swelling and  increased shortness of breath and states he has no refills left.  He has  also run out of a few of his medicines so he thought he better go ahead  and come in now and get stabilized before it got worse as this is what  usually happens in the past.  Here in the emergency room he is  complaining of some nausea and mild shortness of breath and abdominal  distention.  Otherwise states he has been doing quite well, maintained  early class III symptoms.   PAST MEDICAL HISTORY:  1. Congestive  heart failure secondary to nonischemic cardiomyopathy      with an EF of 20%.  Most recent echocardiogram done in November of      2008 showed an EF of 20% with diffuse left ventricular hypokinesis.  2. Mitral regurgitation.  3. History of tobacco use.  4. History of noncompliance.  5. History of nonsustained VTach in the setting of syncope status post      single chamber implantable cardioverter defibrillator.  6. Chronic renal insufficiency with baseline creatinine around 1.7.  7. GERD.  8. History of tobacco and alcohol use but denies any current use.   ALLERGIES:  No known drug allergies.   CURRENT MEDICATIONS:  1. Should be magnesium oxide 400 mg b.i.d.  2. Coumadin per Coumadin clinic.  3. Enalapril 10 mg b.i.d.  4. Hydralazine 25 mg t.i.d.  5. Imdur 30 mg daily.  6. Digoxin 0.25 daily.  7. Lasix 40 mg b.i.d.  8. Lovastatin 10 mg daily.  9.  Spironolactone 25 mg daily.  10.Coreg 12.5 mg b.i.d.   SOCIAL HISTORY:  The patient lives here in [place].  Denies any  current tobacco, EtOH or substance abuse.  Tries to follow a low-sodium  heart healthy diet.  Exercise somewhat limited secondary to heart  failure.   FAMILY HISTORY:  No history of premature coronary artery disease.   REVIEW OF SYSTEMS:  Review of systems positive for occasional  palpitations, mild shortness of breath, dyspnea on exertion, mild  abdominal distention, fullness, positive for generalized weakness with  increased nocturia.  All other systems are reviewed and negative per  patient.   PHYSICAL EXAMINATION:  VITAL SIGNS:  Blood pressure 102/76, heart rate  120, respirations 20, sat 97% on room air.  GENERAL:  In no acute distress.  HEENT:  Normal.  NECK:  Supple without lymphadenopathy, no bruits, JVD around 8-10 cm at  a 45 degree angle.  LUNGS:  Clear to auscultation bilaterally.  CARDIOVASCULAR:  Exam reveals S1 and S2 with a 2/6 systolic ejection  murmur with a prominent S3.  ABDOMEN:  Soft,  nontender, positive bowel sounds.  Mildly distended.  EXTREMITIES:  Lower extremities without clubbing, cyanosis or edema.  NEUROLOGICAL:  Alert and oriented x3.   Chest x-ray showing no acute findings.  EKG sinus tachycardia with signs  of LVH at a rate of 115.   LAB WORK:  Sodium 145, potassium 4.8, chloride 113, BUN 10, creatinine  1.4, glucose 113, hemoglobin 17.3, hematocrit 51.  Point of cares  negative.  WBCs are 6.1, platelets are 224,000.  Digoxin less than 0.2.  BNP 2297, D-dimer less than 0.22.Marland Kitchen   IMPRESSION:  Congestive heart failure.  A patient with mild exacerbation  needs 24 hour IV diuretics/re-initiate p.o. medications the patient has  run out of.  Check lab work in the morning.  Most likely can discharge  home in the a.m. and have him follow up in the heart failure clinic.  Scheduled to follow up with [facility] for renewal of his  prescriptions.  Dr. Maurine Cane has been in to examine and assess the  patient and agrees with plan of care.      Dorian Pod, ACNP      Noralyn Pick. Eden Emms, MD, [facility]  Electronically Signed    MB/MEDQ  D:  08/18/2007  T:  08/19/2007  Job:  [id number]   cc:   Melvern Banker

## 2010-11-06 NOTE — Assessment & Plan Note (Signed)
HEALTHCARE                            CARDIOLOGY OFFICE NOTE   FREDIE, MAJANO                         MRN:          [id number]  DATE:12/22/2006                            DOB:          Feb 26, 1961    INTERVAL HISTORY:  Mr. Thurman is a very pleasant 50 year old male with a  history of severe congestive heart failure secondary to nonischemic  cardiomyopathy with an EF of 15%, status post ICD.  He is milrinone  dependent.  He returns today for routine followup.  Overall he is doing  quite well, he feels his activity tolerance is much improved.  He is  able to get around and do nearly anything he wants with the milrinone  drip.  He did note that he felt his heart racing the other day and  started taking his Coreg again.  He has taken for the past 3 days and  has not felt any worse.  He denies any orthopnea, PND, no lower  extremity edema.   CURRENT MEDICATIONS:  1. Magnesium oxide 400 b.i.d.  2. Coumadin.  3. Enalapril 10 b.i.d.  4. Hydralazine 25 t.i.d.  5. Imdur 30 a day.  6. Digoxin 0.25 a day.  7. Lasix 40 b.i.d.  8. Lovastatin 10 a day.  9. Spironolactone 25 a day.  10.Milrinone infusion 0.5 mcg per kg per minute.  11.Coreg 12.5 mg b.i.d.   PHYSICAL EXAMINATION:  He is well-appearing, no acute distress.  Ambulates around the clinic briskly without any respiratory difficulty.  Blood pressure is 114/98, heart rate is 89, weight is 123.  HEENT:  Normal.  NECK:  Supple, no JVD.  Carotids are 2+ bilaterally without any bruits.  There is no lymphadenopathy or thyromegaly.  CHEST WALL:  His Port-A-Cath looks fine.  CARDIAC:  His PMI is widely displaced, he is regular, he has a soft,  systolic ejection murmur at the apex as well as a soft S3 which is much  less pronounced than before.  LUNGS:  Clear.  ABDOMEN:  Soft, nontender, nondistended.  There is no  hepatosplenomegaly, no bruits, no masses, good bowel sounds.  EXTREMITIES:  Warm with no  cyanosis, clubbing, or edema.  NEURO:  Alert and oriented x3, cranial nerves II-XII are intact, moves  all 4 extremities without difficulty.  Affect is very pleasant.   ASSESSMENT/PLAN:  1. Chronic systolic heart failure, he is much improved with milrinone.      Surprisingly he is even able to tolerate a fairly good dose of      Coreg.  Given that he has not had a set back from this I told him      to continue the current Coreg dose.  Should he have any worsening      of his functional capacity I told him he could go ahead and cut the      Coreg back.  He is due to follow up in the [facility] Transplant      Clinic in 2 weeks.  At this juncture I will not make any medication  changes and we will see him back here in 3-4 weeks.  2. Left ventricular apical thrombus.  He is maintained on Coumadin.      On his last echocardiogram this was no longer visible but given his      extremely poor ejection fraction I do think it is reasonable to      keep him on it for the time being.     Bevelyn Buckles. Bensimhon, MD  Electronically Signed    DRB/MedQ  DD: 12/22/2006  DT: 12/22/2006  Job #: [id number]

## 2010-11-06 NOTE — H&P (Signed)
NAMEXAVYER, STEENSON NO.:  [id number]   MEDICAL RECORD NO.:  [id number]          PATIENT TYPE:  INP   LOCATION:  2919                         FACILITY:  [facility]   PHYSICIAN:  Marca Ancona, MD      DATE OF BIRTH:  March 21, 1961   DATE OF ADMISSION:  08/22/2008  DATE OF DISCHARGE:                              HISTORY & PHYSICAL   PRIMARY CARDIOLOGIST:  Bevelyn Buckles. Bensimhon, MD   HISTORY OF PRESENT ILLNESS:  This is a 50 year old with a history of  nonischemic cardiomyopathy who presents because of worsening dyspnea on  exertion and fatigue as well as nausea, vomiting, and decreased urine  output.  The patient reports greater shortness of breath now compared to  his baseline.  This has been going on for about 3 weeks.  He is short of  breath after walking about 10 feet.  He also describes that he is  overall extremely tired and fatigued.  He has also had worsening  orthopnea.  He has been sleeping on 4 pillows and has occasional  episodes of PND.  Over the last several weeks, he has also been  nauseated with occasional emesis.  He does have some mild epigastric  pain as well.  He has been taking all his medications he says, but has  not been weighing himself.  He does report lower urine output on his  current dose of Demadex.   PAST MEDICAL HISTORY:  1. Nonischemic cardiomyopathy.  The patient had an echocardiogram done      in November 2008, EF was 20%.  There was a markedly dilated LV with      diffuse global hypokinesis, there was moderate MR, RV was normal.      The patient has been on home milrinone in the past before in the      setting of acute congestive heart failure decompensation.  He is      currently not on home milrinone though.  2. Guidant ICD with history of nonsustained ventricular tachycardia.  3. History of prior hypertension.  4. Medical noncompliance.  This actually necessitated his removal from      the transplant list twice.  5.  Gastroesophageal reflux disease.  6. Depression.  7. History of LV thrombus.  The patient is on Coumadin for this.  8. History of tobacco abuse.  The patient says he quit a month ago.   MEDICATIONS:  1. Magnesium oxide 400 mg b.i.d.  2. Coumadin.  3. Torsemide 20 mg b.i.d.  4. Digoxin 0.125 mg daily.  5. Coreg 3.125 mg b.i.d.  6. Potassium chloride 20 mEq b.i.d.  7. Zocor 10 mg daily.  8. Lisinopril 5 mg daily.   SOCIAL HISTORY:  The patient lives in [place].  He lives alone.  He  is disabled.  He quit smoking one month ago.  He has had no alcohol  since New Year.  He does not use any illicit drugs.   FAMILY HISTORY:  Noncontributory.   LABORATORY DATA:  White count 5.8, hematocrit 48.5, platelets 228.  Sodium 136, potassium 4.1, BUN 18, creatinine  1.5, glucose 107.  AST 31,  ALT 33.  BNP was 1607 on August 02, 2008, it is 37 today.  Troponin is  less than 0.05.  Lipase is normal.  INR is 1.9.   DIAGNOSTIC STUDIES:  Chest x-ray shows no pulmonary edema.  There are no  acute changes.  EKG shows sinus tachycardia with biatrial enlargement,  borderline LVH.  There is no change from prior except for now he is  tachycardic.   PHYSICAL EXAMINATION:  VITAL SIGNS:  Blood pressure ranges from 91 to  108/74 to 83.  Heart rate is in the 100s to 110s and regular.  The  patient is afebrile.  O2 saturation 99% on room air.  GENERAL:  This is a chronically ill-appearing male in no apparent  distress.  Neurologically alert and oriented x3.  Normal affect.  HEENT:  Normal exam.  ABDOMEN:  Soft, nontender.  The liver edge is palpable, but nontender.  NECK:  No thyromegaly or thyroid nodule.  JVP is 7 cm.  CARDIOVASCULAR:  The patient is tachycardic.  Heart rate is regular.  There is an S3.  There is a 2/6 holosystolic murmur.  PMI is displaced  laterally.  There are 1+ posterior tibial pulses.  There is no  peripheral edema.  There is no carotid bruit.  EXTREMITIES:  There is no clubbing  or cyanosis.  The extremities,  however, are quite cool.  LUNGS:  Clear to auscultation bilaterally.  Normal respiratory effort.  MUSCULOSKELETAL:  Normal.  SKIN:  Normal.   ASSESSMENT AND PLAN:  This is a 50 year old with history of nonischemic  cardiomyopathy who presents with fatigue, nausea, and shortness of  breath.  1. Congestive heart failure.  The patient actually does not appear      severely volume overloaded today.  His BNP is only 79.  His JVP is      not elevated and he has no peripheral edema.  However, he does have      quite significant fatigue, dyspnea, and nausea.  He has [place]      Heart Association class 3 symptoms.  I do wonder if the nausea may      be due to poor forward cardiac output.  Additionally, he is      tachycardic with a systolic blood pressure in the 90s to 100s with      a narrow pulse pressure and cool extremities.  I think this      presentation may be secondary to worsening pump function, cool and      dry.  He has no evidence for other etiologies for his symptoms:      His chest x-ray is okay.  There is no fever.  There is a normal      white count and normal LFTs.  I do fear that he may soon reach the      point where he is inotrope dependent.  We will plan on starting him      on milrinone drip at 0.375 mcg/kg/min.  We will continue him on the      current dose of p.o. Demadex as he appears close to euvolemic.  We      will continue him on his current low dose of carvedilol and will      continue him on his digoxin and his lisinopril as well.  2. The patient has a history of left ventricular thrombus.  He has      been on Coumadin  for this.  He has been on Coumadin and he has good      oxygen saturations, so I do not think he has a pulmonary embolus.      However, I do question his compliance with Coumadin.  We will check      a D-dimer and we can do a V/Q scan if his D-dimer is positive.      Marca Ancona, MD  Electronically  Signed    DM/MEDQ  D:  08/22/2008  T:  08/23/2008  Job:  [id number]

## 2010-11-06 NOTE — Cardiovascular Report (Signed)
[name], STANDING NO.:  [id number]   MEDICAL RECORD NO.:  [id number]          PATIENT TYPE:  INP   LOCATION:  2919                         FACILITY:  [facility]   PHYSICIAN:  Verne Carrow, MDDATE OF BIRTH:  [birth year]   DATE OF PROCEDURE:  08/26/2008  DATE OF DISCHARGE:  08/27/2008                            CARDIAC CATHETERIZATION   PRIMARY CARDIOLOGIST:  Bevelyn Buckles. Bensimhon, MD   PROCEDURE PERFORMED:  Right heart catheterization with IV milrinone  infusion.   OPERATOR:  Verne Carrow, MD   INDICATIONS:  A 50 year old African American male with history of  nonischemic cardiomyopathy with end-stage congestive heart failure who  has been on home milrinone therapy in the past.  The patient has been  readmitted with acute-on-chronic systolic heart failure and is here  today to assess his response to intravenous milrinone.   PROCEDURE IN DETAIL:  The patient was brought to the inpatient cardiac  catheterization laboratory.  The venous access was obtained through the  left femoral vein.  A 7-French sheath was inserted into the left femoral  vein without difficulty.  A Swan-Ganz catheter was then used to perform  the right heart catheterization.  After baseline pressures were obtained  and baseline cardiac output was obtained, the patient was started on IV  milrinone infusion at 0.375 mcg/kg/minute.  After the infusion had been  going for 10 minutes, we repeated the PA saturation as well as the  cardiac output and cardiac index.  The patient tolerated the procedure  well.   HEMODYNAMIC FINDINGS:  At baseline, the right atrial pressure was 8/0  with a mean of 6, right ventricular pressure was 45/8 with an end-  diastolic pressure of 9, pulmonary artery pressure was 39/20 with a mean  of 29, pulmonary capillary wedge pressure was 28/30 with a mean of 26.  Pulmonary artery saturation was 61%.  Aortic saturation was 99%.  Cardiac output was 2.88 liters  per minute (by Fick method).  Cardiac  index was 1.72 liters per minute per meter square (by Fick method).   After intravenous milrinone infusion, the pulmonary capillary wedge  pressure was found to be 19/23 with a mean of 20.  Pulmonary artery  saturation was 66%.  Aortic saturation was 99%.  Cardiac output was 3.32  liters per minute (by Fick method).  Cardiac index was 1.99 liters per  minute per meter squared (by Fick method).   IMPRESSION:  Significant improvement in the pulmonary capillary wedge  pressure and cardiac index with milrinone infusion.   RECOMMENDATIONS:  The patient will need home milrinone therapy.      Verne Carrow, MD  Electronically Signed     CM/MEDQ  D:  08/26/2008  T:  08/27/2008  Job:  [id number]   cc:   Bevelyn Buckles. Bensimhon, MD

## 2010-11-06 NOTE — Discharge Summary (Signed)
NAMESHYLER, HOLZMAN NO.:  [id number]   MEDICAL RECORD NO.:  [id number]          PATIENT TYPE:  INP   LOCATION:  2010                         FACILITY:  [facility]   PHYSICIAN:  Rollene Rotunda, MD, FACCDATE OF BIRTH:  [birth year]   DATE OF ADMISSION:  06/21/2008  DATE OF DISCHARGE:  06/23/2008                               DISCHARGE SUMMARY   PRIMARY CARDIOLOGIST:  Bevelyn Buckles. Bensimhon, MD.   DISCHARGING DIAGNOSES:  1. Acute-on-chronic congestive heart failure secondary to nonischemic      cardiomyopathy with ejection fraction of 15%, exacerbation in the      setting of medical noncompliance with lisinopril and spironolactone      with the patient also consuming ethyl alcohol during the holiday      season.  2. Anticoagulation therapy with medical noncompliance.  Last INR      checked in early November with the patient not following up since      that time with a subtherapeutic INR of 1.9 on admission.   PAST MEDICAL HISTORY:  1. Nonsustained V-tach status post Guidant ICD.  2. Chronic renal insufficiency, baseline creatinine 1.7.  3. GERD.  4. EtOH abuse.  5. History of tobacco abuse.  6. Placement of a Port-A-Cath in June 2008 for home milrinone therapy.  7. History of left ventricular thrombus with chronic anticoagulation      therapy.  8. Migraines.  9. Congestive heart failure secondary to nonischemic cardiomyopathy as      described above.   HOSPITAL COURSE:  Mr. Cervenka is a 50 year old African American gentleman  followed by Dr. Nicholes Mango with known history of congestive heart  failure/medical noncompliance who presents to [facility] Emergency Room  on day of admission complaining of 2 weeks of increased abdominal  distention with some alcohol intake over the Christmas holiday.  Mr.  Sylla stated compliance with 2-g sodium restricted diet, however, has  stopped his lisinopril and spironolactone because of side effects, i.e.,  the patient states  that cause erectile dysfunction.  He was seen by  myself and Dr. [name] for admission.  The patient was found to be  hypotensive.  Coreg dose was decreased to 3.125 mg b.i.d., digoxin  continued at 0.125, Lasix 40 mg IV b.i.d. started, and lisinopril 2.5 mg  p.o. b.i.d. was resumed.  Spironolactone was discontinued secondary to  medical noncompliance.  Coumadin level managed by Pharmacy with  reinforcement of education.  The patient responded well to IV diuresis.  Dr. Antoine Poche was in to see the patient on day of discharge.  Plan at  this time will be to discharge the patient home on Demadex 20 mg b.i.d.,  continue the lisinopril at 5 mg daily, potassium 20 mEq b.i.d. in lieu  of spironolactone, potassium 3.8 at the time of discharge, warfarin 2.5  mg daily.  INR at the time of discharge was 3.2.  I have scheduled the  patient for Coumadin Clinic visit on June 27, 2008, at 2:45.  He may  resume his Zocor and allopurinol as previously prescribed, aspirin 325,  Coreg 3.125 b.i.d., digoxin 0.125,  Demadex and warfarin as stated.  The  patient will need a  BMET checked when he sees Dr. Gala Romney on Monday, June 27, 2008, at  3:00 p.m.  Diet and medication compliance has been reinforced with the  patient.   DURATION OF DISCHARGE ENCOUNTER:  Greater than 30 minutes.      Dorian Pod, ACNP      Rollene Rotunda, MD, [facility]  Electronically Signed    MB/MEDQ  D:  06/23/2008  T:  06/24/2008  Job:  [id number]

## 2010-11-06 NOTE — Cardiovascular Report (Signed)
Eric, [name] NO.:  [id number]   MEDICAL RECORD NO.:  [id number]          PATIENT TYPE:  INP   LOCATION:  2924                         [facility]ACILITY[facility]  [facility]   PHYSICIAN:  Bevelyn Buckles. Bensimhon, MDDATE OF BIRTH:  1961-03-27   DATE OF PROCEDURE:  DATE OF DISCHARGE:                            CARDIAC CATHETERIZATION   PATIENT IDENTIFICATION:  [name] is a 50 year old male with severe  nonischemic cardiomyopathy and biventricular heart failure who was  admitted several weeks ago with a low output state.  He had improved  initially with milrinone.  We had tried several times to wean the  milrinone however, this was unsuccessful.  He now appears to need home  inatropic therapy.  He was brought to the catheterization lab for right  heart cath and milrinone challenge to assess his hemodynamic response to  inatrpoes.   DESCRIPTION OF PROCEDURE:  The risks and benefits of catheterization  were explained.  Consent was signed and placed on the chart.  A 7-French  venous sheath was placed in the right femoral vein and a standard right  heart cath was performed with a Swan-Ganz catheter.  Once initial  hemodynamics were obtained, the patient was then started on IV milrinone  and treated with escalating doses of 0.25 mcg/kg/min up to 0.5  mcg/kg/min.  At each stage the patient was allowed to equilibrate for at  least 5 minutes for followup hemodynamics and outputs were measured.   RESTING HEMODYNAMICS:  Right atrial pressure mean of 21, RV pressure  52/90, with a PA pressure 55/40, a mean of 47.  Pulmonary capillary wedge pressure mean of 30.  Thermodilution cardiac output was 2.2.  His last cardiac index was 1.3.  Fick cardiac output was 1.7 with a cardiac index of 1.   MILRINONE AT 0.25 MICROGRAMS PER KILOGRAM PER MINUTE:  Pulmonary  capillary wedge pressure mean of 30.  Thermodilution cardiac output was 2.30.  Cardiac index 1.4.   MILRINONE 0.375 MICROGRAMS PER  KILOGRAM PER MINUTE:  Pulmonary capillary  wedge pressure, mean of 30.  Thermodilution cardiac output 2.2  Cardiac index of 1.3.   MILRINONE 0.50 MICROGRAMS PER KILOGRAM PER MINUTE:  Pulmonary capillary  wedge pressure, mean of 22.  Thermodilution cardiac outputs 2.5.  Cardiac index of 1.5.  Fick cardiac output was 2.3, cardiac index 1.3.   ASSESSMENT:  1. Severe biventricular heart failure with a low output      state/cardiogenic shock.  2. Significant improvement in hemodynamics with milrinone.   PLAN:  1. We will consult [facility] for a Port-A-Cath to initiate home      inatropic therapy.  2. We will hold his Coreg and increase his digoxin.  3. We will check a dyssynchrony echo to see if he would benefit from a      biventricular upgrade, despite his narrow complex QRS.  4. We will resume the heparin in 2 hours given his LV thrombus.  5. We will refer back to the transplant clinic at [facility] to get him back      on the transplant list which he was  on before.      Bevelyn Buckles. Bensimhon, MD  Electronically Signed     DRB/MEDQ  D:  11/27/2006  T:  11/27/2006  Job:  [id number]

## 2010-11-06 NOTE — Consult Note (Signed)
NAMEDEMARUS, LATTERELL NO.:  [id number]   MEDICAL RECORD NO.:  [id number]          PATIENT TYPE:  INP   LOCATION:  2924                         FACILITY:  [facility]   PHYSICIAN:  Ardeth Sportsman, MD     DATE OF BIRTH:  1960/09/30   DATE OF CONSULTATION:  11/27/2006  DATE OF DISCHARGE:                                 CONSULTATION   CONSULTING SURGEON:  Dr. Michaell Cowing.   PRIMARY CARDIOLOGIST:  Dr. Tenny Craw.   REQUESTING PHYSICIAN:  Dr. Gala Romney.   REASON FOR CONSULTATION:  Needs Port-A-Cath for home milrinone therapy.   HISTORY OF PRESENT ILLNESS:  Mr. Cormier is a 50 year old male patient,  history of severe non-ischemic cardiomyopathy.  The patient reports  initial onset of symptoms after a viral illness several years ago.  He  has recently been admitted to [facility] on Nov 14, 2006, after  experiencing severe decompensated congestive heart failure.  He was  experiencing profound dyspnea on exertion and lower extremity edema.  At  home, he was unable to walk 20 feet, without becoming severely wounded  and fatigued.  When he presented to [facility], he eventually underwent a 2-D  echocardiogram with a EF 10%.  His prior EF had been about 25%.  The  patient also has an implantable cardio-defibrillator, due to non-  sustained ventricular tachycardia with associated syncope.   The patient underwent cardiac catheterization today, to evaluate right  heart pressures, as well as to perform an milrinone challenge.  He was  noted to have significant increasing cardiac hemodynamics with  administration of milrinone.  Therefore, was felt he would be a good  candidate for milrinone therapy at home.  Surgical consultation has been  requested for placement of Port-A-Cath for the milrinone therapy.  In  addition, the patient is also going to go back on the cardiac transplant  list and therefore needs an access with decreased risk of potential for  infection and bacteremia.  Also this  hospitalization, is important to  note that the patient is on anticoagulation with heparin and Coumadin,  secondary to a 1.8 by 1.5-cm mobile LV thrombus being found.   REVIEW OF SYSTEMS:  As above.  He has marked improvement in his  symptoms, since admission with administration of appropriate medical  management.   SOCIAL HISTORY:  Prior tobacco and alcohol, none currently.   PAST MEDICAL HISTORY:  1. Severe non-ischemic cardiomyopathy, EF now 10%.  2. New diagnosis of LV thrombus, mobile, currently on heparin and      warfarin.  3. Moderate MR with systolic murmur.  4. Non-sustained ventricular tachycardia with associated syncope in      the past.  5. Chronic renal insufficiency, baseline creatinine 1.7.  6. GERD.  7. Hypertension.  8. History of prior noncompliance.   PAST SURGICAL HISTORY:  Single-chamber AICD.  This is a Guidant model  [id number] [phone], noting that cardiologist are considering asking  electrophysiology to come by and upgrade him to a biventricular  pacemaker defibrillator for cardiac resynchronization therapy.   ALLERGIES:  NO KNOWN DRUG ALLERGIES.   CURRENT  MEDICATIONS:  Include:  1. Lanoxin.  2. Aspirin.  3. Magnesium.  4. Warfarin.  5. Lasix.  6. Vasotec.  7. Coreg.  8. Aldactone.  9. Apresoline.  10.Imdur.  11.Several p.r.n. medications.   PHYSICAL EXAM:  GENERAL:  Pleasant male patient without specific  complaints, in need of Port-A-Cath for home IV therapies.  VITAL SIGNS:  Temperature 98.2, BP 102/68, pulse 105 and regular,  respirations 20.  NEURO:  The patient is alert and oriented x3, moving all extremities x4  without focal deficits.  HEENT:  Head normocephalic, sclera noninjected.  NECK:  Supple.  No adenopathy.  CHEST:  Bilateral lung sounds are clear to auscultation without any  basilar crackles.  He is currently on room air.  CARDIAC:  Grade 0-6 systolic murmur best heard left sternal border, just  adjacent to the sternum, second  to third intercostal space.  He is in  sinus tachycardia, without any ectopy at present.  ABDOMEN:  Soft, nontender, nondistended without hepatosplenomegaly,  masses or hernias noted.  EXTREMITIES:  Symmetrical in appearance without current edema, cyanosis  or clubbing.   LABS:  PTT 37, PT 21, INR 1.7 today.  BNP is 1864, sodium 135, potassium  4.8, CO2 29, glucose 86, BUN 14, creatinine 1.19.  The patient had a CBC  checked on Nov 22, 2006.  A white count was 5,000, hemoglobin 15.2,  platelets 282,000.   DIAGNOSTICS:  Echocardiogram and cardiac catheterization as noted.   IMPRESSION:  1. Severe nonischemic cardiomyopathy with associated recent      decompensation congestive heart failure, in need of Port-A-Cath for      home milrinone therapy.  2. New diagnosis of left ventricular thrombus, currently on IV heparin      and warfarin.  3. Guidant implantable cardiac defibrillator, secondary to non-      sustained ventricular tachycardia with associated syncope.   PLAN:  1. OR in the morning for the Port-A-Cath, as long as coags been      adequately corrected per Dr. Gordy Savers recommendations.  2. The patient current is currently on warfarin with his INR today      being 1.7.  I will go ahead and place the warfarin on hold for      tonight.  The patient may or may not need to receive FFP and/or      vitamin K.  I will leave this to Dr. Gordy Savers discretion.  3. He is also on IV heparin as a bridge for the Coumadin, since he      does have an LV thrombus.  We will need to hold heparin at least 6      hours before the procedure.  I am uncertain of the timing of the OR      at this point.  Therefore, Dr. Michaell Cowing will order exactly when the      heparin needs be placed on hold preoperatively.  4. Will check PT/INR in the morning around 5:00 and have the results      called to [place], so we can know whether to proceed with surgery in the     morning and when to possibly hold the heparin, if the  case is not a      first case.  5. Will need the Guidant rep to disengage the ICD for OR.  6. Additional recommendations per Dr. Michaell Cowing.      Allison L. Rennis Harding, N.P.      Ardeth Sportsman, MD  Electronically Signed  ALE/MEDQ  D:  11/27/2006  T:  11/27/2006  Job:  [id number]   cc:   Pricilla Riffle, MD, [facility]

## 2010-11-06 NOTE — Assessment & Plan Note (Signed)
[facility]                            CARDIOLOGY OFFICE NOTE   [name], Eric                         MRN:          [id number]  DATE:05/12/2007                            DOB:          1961-06-13    PRIMARY CARE PHYSICIAN:  Dr. Oliver Barre.   INTERVAL HISTORY:  [name] is a 50 year old male with a history of severe  congestive heart failure secondary to nonischemic cardiomyopathy with an  EF of 10-15%.  He is maintained on milrinone.  He also has an LV  thrombus for which he has previously been on Coumadin.  He was evaluated  by the [facility] but turned down due to his noncompliance  and continuing tobacco use.   He returns today.  He says he feels great.  He says he can walk for  miles at a time without any limitation.  Denies any shortness of breath.  No lower extremity edema.  No orthopnea.  No PND.  He is fairly  interested in getting off of the milrinone so he can get a job.   CURRENT MEDICATIONS:  1. Coumadin.  2. Enalapril 10 b.i.d.  3. Hydralazine 25 t.i.d.  4. Imdur 30 a day.  5. Digoxin 0.25 a day.  6. Lasix 40 b.i.d.  7. Lovastatin 10 a day.  8. Spironolactone 25 a day.  9. Milrinone.  10.Coreg 12.5 b.i.d.   PHYSICAL EXAM:  He is well-appearing in no acute distress.  He ambulates  around the clinic briskly without any respiratory difficulty.  Blood pressure is 138/84, heart rate is 65, weight is 124.  HEENT:  Normal.  NECK:  Supple.  There is no JVD.  Carotids are 2+ bilaterally without  any bruits.  There is no lymphadenopathy or thyromegaly.  PMI is not displaced.  He has a regular rate and rhythm.  There is no  S3.  I do not hear any mitral regurgitation.  LUNGS:  Clear.  ABDOMEN:  Soft, nontender, nondistended.  No hepatosplenomegaly.  No  bruits.  No masses.  Good bowel sounds.  EXTREMITIES:  Warm with no cyanosis, clubbing, or edema.  No rash.  NEURO:  He is alert and oriented x3.  Cranial nerves 2-12 are  intact.  Moves all 4 extremities without difficulty.  Affect is pleasant.  His Port-a-Cath site in his right chest looks good.   ASSESSMENT AND PLAN:  Congestive heart failure secondary to severe  systolic dysfunction.  Currently, I would put him in a [place] Heart  Association class of I but it can be hard to tell with him.  His volume  status looks good.  His heart rate is down and his blood pressure is up,  so I suspect he may have had some recovery or at least he is just much  better compensated.  We will go ahead and try and take him off his  milrinone and see how he does.  I told him to keep in close touch with  me.  Should he have recurrent symptoms, he will need to restart  immediately.  He has  been able to wean from milrinone in the past, so I  am hopeful that he will be able to stay off of it, at least for the  short term.  We will get a 2D echocardiogram to reevaluate his left  ventricular function, but I think given the chronicity of his heart  failure, I think this is unlikely to change.  We will see him back in 2  weeks and hopefully will continue to increase his vasodilators.   DISPOSITION:  We will stop the milrinone and see him back in 2 weeks.     Bevelyn Buckles. Bensimhon, MD  Electronically Signed    DRB/MedQ  DD: 05/12/2007  DT: 05/13/2007  Job #: [id number]

## 2010-11-06 NOTE — Op Note (Signed)
[name], [name] NO.:  [id number]   MEDICAL RECORD NO.:  [id number]          PATIENT TYPE:  INP   LOCATION:  2924                         FACILITY:  [facility]   PHYSICIAN:  Ardeth Sportsman, MD     DATE OF BIRTH:  1961/03/09   DATE OF PROCEDURE:  DATE OF DISCHARGE:                               OPERATIVE REPORT   PRIMARY CARE PHYSICIAN:  Cecil Cranker, MD, FACC   SURGEON:  Ardeth Sportsman, MD   PREOPERATIVE DIAGNOSIS:  Nonischemic cardiomyopathy acute and chronic  class IV dystolic function.   POSTOPERATIVE DIAGNOSIS:  Nonischemic cardiomyopathy acute and chronic  class IV dystolic function.   PROCEDURE PERFORMED:  Port-A-Cath replacement (note that it is an 8-  [place] power port [id number], lot #[id number]) under ultrasound and  fluoroscopic guidance.   ANESTHESIA:  1. Monitored deep sedation.  2. Local anesthetic (1% lidocaine mixed equally with 0.25% bupivacaine      with epinephrine).   SPECIMENS:  None.   DRAINS:  None.   ESTIMATED BLOOD LOSS:  Less than 5 mL.   COMPLICATIONS:  None apparent.   INDICATION:  [name] is a 50 year old gentleman who has progressive  cardiomyopathy, nonischemic in nature.  He is actually probably going to  try and get back on the heart transplant list.  He is dependent on IV  medications for stabilization including milrinone.  Dr. Gala Romney in the  cardiology department here had been trying to maximize his recovery and  recommendation was made for home IV milrinone and other therapy.  Because of long term IV access required, recommendation was made for  Port-A-Cath placement and we were requested to place it.   Anatomy and physiology of septal veins was discussed.  Technique and  placement and our ultrasound and fluoroscopic guidance was discussed.  Risks such as stroke, heart attack, deep venous thrombosis, pulmonary  embolism and death were discussed.  Risks such as catheter occlusion,  fracture,  infection requiring removal and/or replacement or  repositioning and other risks were discussed.  Risks of bleeding, need  for transfusion, hematoma and other risks were discussed.  Questions  answered, he agreed to proceed.   PERIOPERATIVE FINDINGS:  Normal appearing anatomy status post AICD from  left central veins.   DESCRIPTION OF PROCEDURE:  Informed consent was confirmed.  The patient  was positioned supine with a gel board placed behind his shoulder  blades.  He was switched into supine with both arms tucked.  His  bilateral neck and chest were prepped and draped in a sterile fashion.  Local anesthetic was placed in the field block.   [facility] ultrasound was used to identify the right internal jugular vein  and venipuncture was made through the first access under venipuncture  guidance.  A guide wire was passed under fluoroscopic guidance into the  right heart.   A 2-cm horizontal incision was made in the lateral clavicular line on  the anterior chest wall just a few fingerbreadths above the clavicle.  A  subcutaneous pocket was created.  The power port was flushed and was  tunneled from the chest wound to the neck venipuncture incision.  The  port was secured to the pectoralis fascia using 2-0 Prolene stitches x3  for its three sites with good result.  Catheter flushed well.   The dilator and sheath were placed over a guide wire.  Under  fluoroscopic guidance, the appropriate length was measured out using the  guide wire under fluoroscopy.  The Port-A-Cath was cut to the  appropriate length, passed over the sheath directly using a peel-away  sheath.  There was initially a slight kink at the entry point but this  was straightened out and it seemed nice and smooth.  The catheter  aspirated and flushed well.  Fluoroscopy confirmed the tip was in the  distal superior vena cava.  The port wound on the chest wall was closed  using deep dermal interrupted Vicryl stitches and skin  in both sites was  closed using 4-0 Monocryl stitch.  Sterile dressings were applied and  the patient was sent to the recovery room in stable condition.   There is no family available at this time, but the patient was alert and  discussed with him after surgery.      Ardeth Sportsman, MD  Electronically Signed     SCG/MEDQ  D:  11/28/2006  T:  11/28/2006  Job:  [id number]   cc:   Cecil Cranker, MD, [facility]  Bevelyn Buckles. Bensimhon, MD

## 2010-11-06 NOTE — Assessment & Plan Note (Signed)
[facility]                          CHRONIC HEART FAILURE NOTE   NAME:[name], [name]                         MRN:          [id number]  DATE:07/18/2008                            DOB:          [birth year]    ADDENDUM:   PAST MEDICAL HISTORY:  History of substance abuse.   REVIEW OF SYSTEMS:  As stated above.   CURRENT MEDICATIONS:  1. Magnesium oxide 400 mg b.i.d.  2. Coumadin as directed.  3. Demadex 20 mg b.i.d.  4. Digoxin 0.125 daily.  5. Coreg 3.125 b.i.d.  6. KCl 20 mEq b.i.d.  7. Zocor 10 mg daily.   P.r.n. medications include allopurinol.   PHYSICAL EXAMINATION:  VITAL SIGNS:  Weight 136 pounds.  Weight is down  4 pounds.  Blood pressure 112/80, heart rate 95.  GENERAL:  [name] is in no acute distress.  NECK:  No signs of jugular vein distention at 45-degree angle.  LUNGS:  Clear to auscultation bilaterally.  CARDIOVASCULAR:  Reveals an S1 and an S2.  Prominent S3 gallop noted.  ABDOMEN:  Soft, nontender, positive bowel sounds.  LOWER EXTREMITIES:  Without clubbing, cyanosis, or edema.  NEUROLOGIC:  Alert and oriented x3.   IMPRESSION:  Congestive heart failure secondary to nonischemic  cardiomyopathy.  Trevar has improved today in regards to his recent  hospitalization.  Does not appear to be in volume overload at this time,  although he did not start ACE inhibitor as instructed by Dr. Gala Romney  last  visit.  We will go ahead and have him start lisinopril 5 mg daily and  have him return next week for blood work and I will plan on seeing him  back in another week or so.      Dorian Pod, ACNP       Bevelyn Buckles. Bensimhon, MD    MB/MedQ  DD: 07/18/2008  DT: 07/19/2008  Job #: [id number]

## 2010-11-06 NOTE — Discharge Summary (Signed)
NAMEALVER, LEETE NO.:  [id number]   MEDICAL RECORD NO.:  [id number]          PATIENT TYPE:  INP   LOCATION:  2924                         FACILITY:  [facility]   PHYSICIAN:  Bevelyn Buckles. Bensimhon, MDDATE OF BIRTH:  10/06/1960   DATE OF ADMISSION:  11/14/2006  DATE OF DISCHARGE:  12/03/2006                               DISCHARGE SUMMARY   PRIMARY CARDIOLOGIST:  Bevelyn Buckles. Bensimhon, MD.   DISCHARGE DIAGNOSIS:  Acute on chronic systolic congestive heart  failure.   SECONDARY DIAGNOSES:  1. Severe nonischemic cardiomyopathy with an ejection fraction of 15%      on November 27, 2006.  2. Moderate mitral regurgitation.  3. History of nonsustained ventricular tachycardia with syncope,      status post single chamber automatic implanted cardioverter      defibrillator.  4. Chronic renal insufficiency with a baseline creatinine of 1.7.  5. Gastroesophageal reflux disease.  6. Hypertension.  7. History of medication nonadherence.  8. Remote tobacco and alcohol usage.  9. Left ventricular thrombus found on this admission.  10.Coumadin anticoagulation secondary to left ventricular thrombus.   ALLERGIES:  NO KNOWN DRUG ALLERGIES.   PROCEDURE:  Right heart cardiac catheterization with milrinone titrate  and successful placement of a Port-A-Cath under ultrasound and  fluoroscopic guidance.   HISTORY OF PRESENT ILLNESS:  A 50 year old African American male with  severe chronic CHF and nonischemic cardiomyopathy previously on  transplant list at [facility] who presented to the office on Nov 14, 2006, for  an unscheduled visit due to one month history of progressive dyspnea on  exertion, orthopnea, PND and lightheadedness.  Decision was made to  admit him for diuresis and further evaluation.   HOSPITAL COURSE:  Following admission, patient was placed on milrinone  infusion with mild improvement.  His carvedilol dosage was initially  decreased, then discontinued all together.  As  Mr. Pickering  symptomatically improved, his Lasix was switched from IV to p.o. and his  milrinone was discontinued on Nov 19, 2006.  Unfortunately, however, by  Nov 22, 2006, he was experiencing increased shortness of breath and  decrease in exercise tolerance requiring re-initiation of milrinone  therapy.  Decision was made to perform right heart cardiac  catheterization with milrinone titration to document its effect.  His  milrinone was discontinued the evening of November 26, 2006, and right heart  catheterization was performed November 27, 2006.  At 0.50 mcg/kg/minute of  milrinone infusion, patient had significant improvement in hemodynamics  and decision was made to initiate home milrinone therapy as he would  require continuous IV infusion.  General surgery was consulted and a  Port-A-Cath was successfully placed on November 28, 2006.  He has recovered  from this well and symptomatically has been doing reasonably well on  milrinone therapy.  We have also added hydralazine and nitrate therapy,  spironolactone and have increased his digoxin dose during this  admission.  Following admission, Mr. Rosko underwent 2-D echocardiogram  on Nov 16, 2006, which revealed an EF of 10% with an 18 mm x 15 mm  mobile thrombus attached  to the left ventricular septum.  For that  reason, both heparin and Coumadin therapy were initiated.  A follow-up 2-  D echocardiogram  on November 27, 2006, was performed to evaluate for  dyssynchrony.  This did not show significant dyssynchrony nor was the LV  thrombus visible any longer.  His EF by this echo was 15% (this was done  on milrinone therapy).  As there was no dyssynchrony, we did not feel  that he was a candidate for biventricular pacing/ICD at this time.   DISCHARGE LABORATORY DATA:  Hemoglobin 15.5, hematocrit 46.5, wbc 4.2,  platelets 319.  PT 21.7, INR 1.8.  Sodium 138, potassium 4.9, chloride  100, CO2 30, BUN 12, creatinine 1.32, glucose 86.  Calcium 9.6.   Admission BNP was 2986.  Discharge BNP 1233.   DISPOSITION:  Patient is being discharged home today in good condition.  He has a follow-up Coumadin Clinic appointment on December 04, 2006, at  10:30 a.m.  He is to follow up with Dr. Bevelyn Buckles. Bensimhon on December 11, 2006, at 3:15 p.m.  He will have a BMET checked on that day.   DISCHARGE MEDICATIONS:  1. Aspirin 81 mg daily.  2. Magnesium oxide 400 mg b.i.d.  3. Milrinone infusion 0.50 mcg/kg/minute.  4. Coumadin 5 mg one and a half tablets tonight and then as directed      by Coumadin Clinic.  5. Lovenox 60 mg subcu q.12h. until INR greater than or equal to 2.  6. Enalapril 10 mg b.i.d.  7. Hydralazine 25 mg t.i.d.  8. Imdur 30 mg daily.  9. Digoxin 0.25 mg daily.  10.Lasix 40 mg b.i.d.  11.Lovastatin 10 mg daily.  12.Spironolactone 25 mg daily.   OUTSTANDING LABORATORY STUDIES:  None.   DURATION OF DISCHARGE ENCOUNTER:  60 minutes including physician time.      Nicolasa Ducking, ANP      Bevelyn Buckles. Bensimhon, MD  Electronically Signed    CB/MEDQ  D:  12/03/2006  T:  12/03/2006  Job:  [id number]

## 2010-11-06 NOTE — Assessment & Plan Note (Signed)
[name]                         ELECTROPHYSIOLOGY OFFICE NOTE   OCTAVION, [name]                         MRN:          [id number]  DATE:11/23/2007                            DOB:          [birth year]    [name] returns today for follow-up.  He is a very pleasant young man  with a history of nonischemic cardiomyopathy and congestive heart  failure who has presently class II to III with regard to his heart  failure symptoms.  He had no specific complaints today.   MEDICATIONS:  1. Coumadin as directed.  2. Enalapril 10 twice a day.  3. Hydralazine 25 three times daily.  4. Imdur 30 mg daily.  5. Digoxin 0.25 daily.  6. Lasix 40 twice daily.  7. Lovastatin 10 a day.  8. Aldactone 25 a day.  9. Coreg 12.5 twice daily.   PHYSICAL EXAMINATION:  GENERAL APPEARANCE:  He is a pleasant young man  in no acute distress.  VITAL SIGNS:  Blood pressure was 94/70, pulse 80 and regular,  respirations 18, weight was 131 pounds.  NECK:  Revealed 7 cm jugular venous distension.  There was no  thyromegaly.  LUNGS:  Clear bilaterally to auscultation.  There were minimal rales in  the bases bilaterally.  No wheezes or rhonchi were present.  CARDIOVASCULAR:  Regular rate and rhythm.  Normal S1 and S2.  The PMI  was enlarged and laterally displaced.  There was a soft S3 gallop.  EXTREMITIES:  Demonstrated no cyanosis, clubbing or edema today.   Interrogation of his defibrillator demonstrates a Guidant Vitality, the  R-waves 16, the impedance 468, threshold 1.4 at 0.5, the battery voltage  was 3.17 volts.   IMPRESSION:  1. Nonischemic cardiomyopathy.  2. Congestive heart failure.  3. Status post implantable cardioverter defibrillator insertion.   DISCUSSION:  Overall, [name] is stable and his defibrillator is  working normally.  We will see him back in the office in one year,  sooner should he have additional questions or problems with his  device.     Doylene Canning. Ladona Ridgel, MD  Electronically Signed    GWT/MedQ  DD: 11/23/2007  DT: 11/23/2007  Job #: [id number]

## 2010-11-06 NOTE — Discharge Summary (Signed)
NAMEMERT, DIETRICK NO.:  [id number]   MEDICAL RECORD NO.:  [id number]          PATIENT TYPE:  INP   LOCATION:  6531                         FACILITY:  [facility]   PHYSICIAN:  Pricilla Riffle, MD, FACCDATE OF BIRTH:  April 27, 1961   DATE OF ADMISSION:  08/18/2007  DATE OF DISCHARGE:  08/21/2007                               DISCHARGE SUMMARY   PRIMARY CARDIOLOGIST:  Pricilla Riffle, M.D., [facility].  She is also followed  in the [facility] Heart Failure Clinic by Dorian Pod.   PRIMARY CARE PHYSICIAN:  [facility].   DISCHARGE DIAGNOSIS:  Acute-on-chronic systolic congestive heart  failure.   SECONDARY DIAGNOSES:  1. 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  2. Mitral regurgitation.  3. Medication nonadherence.  4. Nonsustained ventricular tachycardia.  5. History of syncope status post single chamber implantable      cardioverter-defibrillator.  6. Chronic renal insufficiency.  7. Gastroesophageal reflux disease.  8. History of tobacco and alcohol use.  9. 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   ALLERGIES:  NO KNOWN DRUG ALLERGIES.   PROCEDURE:  None.   HISTORY OF PRESENT ILLNESS:  A 50 year old noncompliant African American  male with prior history of chronic systolic congestive heart failure as  well as dilated nonischemic cardiomyopathy, EF 20% with diffuse LV  hypokinesis.  He presented to the [facility] ED on August 18, 2007  secondary to progressive symptoms of dyspnea and abdominal bloating and  weight gain.  He has also run out of all of his medications and did not  have any refills.  His chest x-ray showed no acute findings, while he  was tachycardiac and in sinus rhythm with LVH by ECG.  BNP was elevated  at 2297.  He was admitted for diuresis.   HOSPITAL COURSE:  The patient was maintained on IV Lasix with brisk  diuresis.   His cardiac markers remained negative, and his BNP has come  down to 1381.0.  He was reinitiated on beta blocker, ACE inhibitor,  spironolactone, hydralazine and nitrate therapy and has tolerated these  well.  He has exhibited some nonsustained ventricular tachycardia  lasting roughly 3 to 4 seconds and not requiring a shock.  He does  occasionally note these palpitations.  We have recommended continued  compliance with beta blocker therapy.  Mr. Reasoner is being discharged  home today in good condition.  We have counseled him on the importance  of medication and lifestyle adherence.  Of note, he has not had his  Coumadin checked for quite some time, and we have arranged for him to  have followup in our Coumadin clinic on August 27, 2007.  We have advised  that continued noncompliance with Coumadin followup will require [place] to  discontinue the medication.  The patient says that he will be compliant.   DISCHARGE LABS:  Hemoglobin 17.3, hematocrit 51.0, WBC 6.1, platelets  224.  INR 2.2.  Sodium 141, potassium 4.5, chloride 99, CO2 34, BUN 11,  creatinine  1.30, glucose 84.  Total bilirubin 2.3, alkaline phosphatase  81, AST 38, ALT 38, total protein 6.5, albumin 3.5, calcium 8.9,  magnesium 2.2.  CK 168, MB 1.6, troponin I 0.03.  BNP 1381.0.  Total  cholesterol 136, triglycerides 55, HDL 51, LDL 74.  TSH 3.208.  Digoxin  level less than 0.2.  Urine drug screen was negative.   DISPOSITION:  The patient is being discharged home today in good  condition.   FOLLOWUP APPOINTMENTS:  Will arrange for Coumadin Clinic followup on  August 27, 2007.  He also has Heart Failure Clinic followup with Dorian Pod, nurse practitioner, on August 27, 2007 at 9:40 a.m.   DISCHARGE MEDICATIONS:  1. Spirolactone 25 mg daily.  2. Imdur 30 mg daily.  3. Digoxin 0.25 mg daily.  4. Magnesium oxide 400 mg b.i.d.  5. Lovastatin 10 mg q.h.s.  6. Coreg 12.5 mg 1-1/2 tablets b.i.d.  7. Coumadin 5 mg q.h.s.  8.  Enalapril 10 mg b.i.d.  9. Hydralazine 25 mg t.i.d.  10.Lasix 40 mg 2 tablets in the a.m. and 1 tablet in the p.m.  11.K-Dur 40 mEq daily.   OUTSTANDING LAB STUDIES:  The patient will have repeat BMET on Thursday  August 27, 2007.   DURATION OF DISCHARGE ENCOUNTER:  60 minutes, including physician time.      Nicolasa Ducking, ANP      Pricilla Riffle, MD, [facility]  Electronically Signed    CB/MEDQ  D:  08/21/2007  T:  08/21/2007  Job:  [id number]   cc:   Dala Dock

## 2010-11-06 NOTE — Assessment & Plan Note (Signed)
[facility]                            CARDIOLOGY OFFICE NOTE   SEQUAN, AUXIER                         MRN:          [id number]  DATE:12/11/2006                            DOB:          1961/04/16    PRIMARY CARE PHYSICIAN:  Dr. Oliver Barre.   PRIMARY CARDIOLOGIST:  Dr. Dietrich Pates.   INTERVAL HISTORY:  Mr. Roughton is a very pleasant 50 year old male with a  history of congestive heart failure secondary to severe non-ischemic  cardiomyopathy with an EF of around 10-15%. He was recently hospitalized  with cardiogenic shock. A right heart catheterization showed an cardiac  index of 1.0. His beta blocker was stopped and he was started on  milrinone. He had a good diuresis. Echocardiogram revealed an LV  thrombus and he was started on Coumadin. He returns today for routine  follow up.   He says that he feels much better since being on the milrinone. His  shortness of breath has markedly improved. He is able to ambulate and do  all his activities of daily living without too much trouble. He denies  any orthopnea, PND, or lower extremity edema. He has not had problems  with his Port-A-Cath. He has not had any palpitations. There is no  syncope or pre-syncope.   He did see Dr. [name] in the transplant clinic earlier this week and are  discussing possibly re-listing him, but this is complicated by the fact  that he previously had a heart available and was unable to be contacted.   CURRENT MEDICATIONS:  1. Aspirin 81 mg.  2. Magnesium oxide 400 mg.  3. Milrinone infusion at 0.5 mcg per kg per minute.  4. Coumadin.  5. Enalapril 10 mg b.i.d.  6. Hydralazine 25 mg t.i.d.  7. Imdur 30 mg daily.  8. Digoxin 0.25 mg daily.  9. Lasix 40 mg b.i.d.  10.Lovastatin 10 mg daily.  11.Spironolactone 25 mg daily.   PHYSICAL EXAMINATION:  GENERAL:  He is in no acute distress. He  ambulates around the clinic without any respiratory difficulty.  VITAL SIGNS:   Blood pressure 94/68, heart rate 97, weight 126 which is  down 14 pounds from previous.  HEENT:  Normal.  NECK:  Supple. There is no JVD. Carotids are 2+ bilaterally without any  bruits. There is no lymphadenopathy or thyromegaly.  CARDIAC:  His PMI is widely displaced. He has regular rate and rhythm  with 2/6 mitral regurgitation murmur and prominent S3.  LUNGS:  Clear.  ABDOMEN:  Soft and nontender, nondistended. No hepatosplenomegaly. No  bruits. No masses. No ascites appreciated.  EXTREMITIES:  Warm with no cyanosis, clubbing, or edema.  NEUROLOGIC:  Awake, alert, and oriented x3. Cranial nerves II-XII are  intact. He can use all four extremities without difficulty. Affect is  appropriate.   EKG shows sinus rhythm at a rate of 97 with lateral T-wave inversions  due to re-polarization.   ASSESSMENT AND PLAN:  Congestive heart failure secondary to severe non-  ischemic cardiomyopathy. He is symptomatically improved on milrinone  infusion. He has been intolerant of beta  blockade due to a low output  state. At this point, I will not make any changes in his medications. We  will check a BMET and BNP to make sure his renal function and  electrolytes are stable. He will continue to follow up with Dr. [name]  in the transplant clinic at [facility]. We will see him back in two weeks for  routine follow up.     Bevelyn Buckles. Bensimhon, MD  Electronically Signed    DRB/MedQ  DD: 12/11/2006  DT: 12/12/2006  Job #: [id number]   cc:   Versie Starks, MD

## 2010-11-08 ENCOUNTER — Telehealth: Payer: Self-pay

## 2010-11-08 NOTE — Telephone Encounter (Signed)
Attempted to contact pt regarding past due Coumadin follow-up. Delinquent letter sent to pt. 

## 2010-11-09 NOTE — Assessment & Plan Note (Signed)
[facility]                            CARDIOLOGY OFFICE NOTE   SHEDRICK, SARLI                         MRN:          [id number]  DATE:08/27/2006                            DOB:          [birth year]    Cardiologist:  Dietrich Pates, MD, [facility]  Electrophysiologist:  Lewayne Bunting, MD, [facility]   HISTORY OF PRESENT ILLNESS:  [name] is a 50 year old male patient  followed by Dr. Tenny Craw with a history of nonischemic cardiomyopathy with  an EF of 20% to 25%, who we saw in the emergency room on February 27th  with complaints of increasing abdominal girth and shortness of breath.  We gave him IV Lasix in the emergency room.  We also increased his Lasix  for a few days, and had him return today for followup.  He had a BMET  drawn on March 3rd, which revealed a potassium of 4.5, and a creatinine  of 1.4.  He reports back to the office today for followup.  He says that  he has continued on the 80 mg of Lasix in the morning and 40 mg in the  evening.  His weight has stayed about 130 at home, which is where he  usually feels well at.  He denies any orthopnea.  He denies any lower  extremity edema.  His abdominal girth is down.  Denies any cough or  hemoptysis.  He denies any chest pain.  He denies any syncope or near  syncope.   CURRENT MEDICATIONS:  1. Enalapril 5 mg twice daily.  2. Coreg 12.5 mg b.i.d.  3. Digitek 0.125 mg daily.  4. Lasix 40 mg 2 tablets in the morning, and 1 tablet in the evening.  5. Magnesium oxide 400 mg twice daily.   PHYSICAL EXAMINATION:  He is well developed, well nourished, in no acute  distress.  Blood pressure 98/78.  Pulse is 93.  Weight 139 pounds.  HEENT:  Unremarkable.  NECK:  Is without JVD.  CARDIAC:  Normal S1 and S2.  Positive S3.  A 2/6 systolic ejection  murmur heard best at the apex.  LUNGS:  Clear to auscultation bilaterally without wheezing, rhonchi or  rales.  ABDOMEN:  Soft and non-tender.  EXTREMITIES:  Without  edema.  Electrocardiogram revealed sinus rhythm with a heart rate of 93.  Normal  axis.  Poor R wave progression.  LVH with repolarization abnormality.   IMPRESSION:  1. Chronic systolic congestive heart failure with recent mild      exacerbation, now stabilized.  2. Nonischemic cardiomyopathy with an ejection fraction of 20% to 25%.  3. Normal cardiac catheterization ins 2003.  4. History of nonsustained ventricular tachycardia in the setting of      syncope, status post ACID implantation.  5. History of mild renal insufficiency.  6. Recent history of hyperkalemia.  Potassium supplementation      discontinued.  7. Gastroesophageal reflux disease.  8. History of migraine headaches.  9. Hypertension.  10.History of medical noncompliance.   PLAN:  The patient presents back to the office today for followup.  He  seems to be optivolemic on exam.  Symptomatically, he is improved.  He  is trying to do better with his diet and food intake.  He does note some  abdominal discomfort, and has had this for quite some time.  He does  note quite a bit of indigestion.  He had been on Pepcid in the past.  I  have asked him to go back on Pepcid 20 mg twice a day.  I have also  asked him to establish with a primary care physician to follow up  further his gastroesophageal reflux disease.  We will set him up with  one of our primary care doctors on [facility].  We will follow up  closely on his BMET and BNP levels today.  We will continue Lasix 80 mg  in the morning, and 40 mg in the evening.  He can follow up with Dr.  Tenny Craw as scheduled in the next couple of weeks.   09/02/06 - Addendum:  Labs reviewed.  K+ 3.8 and Creatinine 0.4.  BNP was  not done.      [name], PA-C  Electronically Signed      Cecil Cranker, MD, [facility]  Electronically Signed   SW/MedQ  DD: 08/27/2006  DT: 08/27/2006  Job #: [id number]

## 2010-11-09 NOTE — H&P (Signed)
NAMEBREN, STEERS NO.:  [id number]   MEDICAL RECORD NO.:  [id number]                   PATIENT TYPE:  INP   LOCATION:  4702                                 FACILITY:  [facility]   PHYSICIAN:  Rollene Rotunda, M.D. [facility]            DATE OF BIRTH:  Jun 29, 1960   DATE OF ADMISSION:  08/30/2002  DATE OF DISCHARGE:                                HISTORY & PHYSICAL   REASON FOR CONSULTATION:  Evaluate patient for cardiomyopathy.   HISTORY OF PRESENT ILLNESS:  The patient is a 50 year old gentleman with a  history of dilated cardiomyopathy and EF of 15%.  He was evaluated in 2/03.  He had a catheterization with no coronary disease.  He was managed  medically, but has not been compliant with this.  He reports he ran out of  his medicine 4-5 months ago.  It is not clear that he follows up  consistently.  He reports several weeks of increasing shortness of breath  slowly.  He has had PND and orthopnea.  He has had a 10 pound weight gain.  He has not had significant lower extremity swelling, but his family says  that his face was swelling.  He had a cough productive of white sputum.  He  had some atypical chest discomfort and occasional fluttering.  He came to  the hospital where an echocardiogram repeated has demonstrated an EF of 10-  15% with severe MR.  The left atrium was moderately dilated.  He feels  better with diuresis.  His chest x-ray is unremarkable.  He has had negative  enzymes.   PAST MEDICAL HISTORY:  1. Congestive heart failure.  2. ETOH use.  3. Tobacco abuse.  4. Gastroesophageal reflux disease.   PAST SURGICAL HISTORY:  Right testicular surgery as a child.   ALLERGIES:  None.   MEDICATIONS:  The patient previously was on Toprol 50 mg, Mavik 1 mg,  spironolactone 25 mg, and Lasix 20 mg t.i.d.   SOCIAL HISTORY:  The patient lives in [place].  He is unemployed.  He is  engaged to be married.  He quit smoking two weeks ago, after smoking  for 23  years.  He says he drinks occasionally, but does not think he is an  alcoholic.   FAMILY HISTORY:  Vague, but there is no apparent congenital heart disease or  sudden cardiac death.   REVIEW OF SYSTEMS:  As stated in the HPI.  Otherwise negative for other  systems.  He does have some problems with impotence.   PHYSICAL EXAMINATION:  GENERAL:  The patient is in no distress.  Blood  pressure 98/60, heart rate 80s and regular.  HEENT:  Eyelids unremarkable.  Pupils equal, round and reactive to light.  Fundi not visualized.  NECK:  No jugular venous distension.  Waveform within normal limits.  Carotid upstroke brisk and symmetric.  No bruits, no thyromegaly.  LYMPHATICS:  No cervical, axillary, inguinal adenopathy.  LUNGS:  Clear to auscultation bilaterally.  BACK:  No costovertebral angle tenderness.  CHEST:  Unremarkable.  HEART:  PMI not displaced or sustained.  S1 and S2 within normal limits.  Positive S3.  No S4.  There was 2/6 apical systolic murmur radiating to the  axilla.  No diastolic murmurs.  ABDOMEN:  Flat, positive bowel sounds.  Normal in frequency and pitch.  No  bruits, rebound, guarding.  No mass, no organomegaly.  SKIN:  No rash, no nodules.  EXTREMITIES:  There were 2+ pulses.  No edema.  NEUROLOGIC:  Oriented to person, place, and time.  Cranial nerves II-XII are  grossly intact.  Motor grossly intact.   LABORATORY DATA:  WBC 5.0, hemoglobin 16.2.  BUN 8, creatinine 1.3, sodium  141, potassium 4.3, BNP 521.  Sinus rhythm.  Axis within normal limits.  Intervals within normal limits.  Poor anterior R wave progression.  Left  ventricular hypertrophy with repolarization changes.   ASSESSMENT AND PLAN:  1. Cardiomyopathy.  The patient has a cardiomyopathy with a very poor     prognosis should he not comply with medications.  I had a long discussion     with the patient and his family regarding this.  Hopefully he will     comply.  I would suggest that we try  to utilize once daily dosing of     medications.  He would potentially do well with either Toprol with a goal     of 150 mg daily; this could be XL.  If not, he needs divided dose     Lopressor at t.i.d. dosing.  Alternatively could use bisoprolol with a     goal of 10 mg daily.  Once daily ACE inhibitor could be Mavik beginning     with 1 mg daily of the goal of 2-4 mg daily as his blood pressure allows.     Diuretics could be p.r.n. based on weights and symptoms.  Use     spironolactone cautiously in a patient with noncompliance.  This also may     be contributing to his impotence, and might be avoided because of that.  2. Tobacco.  He understands the need to quit smoking.  3. Alcohol.  He understands that this may be the etiology of his     cardiomyopathy and will avoid this.  4. Follow up.  We would be happy to follow him with you, and will arrange     this at the time of his discharge.                                               Rollene Rotunda, M.D. [facility]    JH/MEDQ  D:  09/01/2002  T:  09/01/2002  Job:  [id number]   cc:   Pricilla Riffle, M.D. [facility]

## 2010-11-09 NOTE — Discharge Summary (Signed)
NAMECASSIAN, [name] NO.:  [id number]   MEDICAL RECORD NO.:  [id number]          PATIENT TYPE:  INP   LOCATION:  4734                         FACILITY:  [facility]   PHYSICIAN:  Dorian Pod, NP    DATE OF BIRTH:  12-May-1961   DATE OF ADMISSION:  08/19/2004  DATE OF DISCHARGE:  08/22/2004                                 DISCHARGE SUMMARY   DISCHARGE DIAGNOSES:  1.  Advanced heart failure status post cardiac catheterization on August 22, 2004, showing severe cardiomyopathy with decreased cardiac index and      mildly increased filling pressures.  Milrinone and Lasix restarted      secondary to multiple hospitalizations with increased frequency over the      last month.  The patient is to discharge patient and transfer him to      [facility] under the care of Dr. Thad Ranger at the Cardiac Transplant      Service.  The patient is in agreement with this decision.   PAST MEDICAL HISTORY:  1.  Congestive heart failure.  2.  Nonischemic cardiomyopathy with an ejection fraction of 15% [place]      Heart Association Class 3 on note from August 10, 2004. QRS 90      milliseconds status post single chamber ICD placement August 10, 2004,      by Dr. Sharrell Ku with a Guidant Vitality II model #T177, serial #      [id number], defibrillation threshold 21 joules, R waves of 9 mV.  3.  History of noncompliance with alcohol and tobacco, but the patient      reports he has quit all of these activities for at least greater than      the past 6 months.  4.  Status post congestive heart failure, admissions for congestive heart      failure and dyspnea, March 2004, October 2005, December 2005, February      2006.  5.  History of dyspepsia, gastritis, gastric reflux.  The patient also with      slightly increased creatinine baseline 1.4 on August 10, 2004.      Creatinine hovering around 1.7 currently.  6.  Nonsustained VTACH.   CURRENT MEDICATIONS:  1.  Coreg 6.25 mg p.o.  b.i.d.  2.  Lasix 40 mg p.o. b.i.d.  3.  Protonix 40 mg daily.  4.  Milrinone 0.125 mcg/kg per minute.   LABORATORY DATA:  As of today, PTT 29, PT 13.6 with INR 1.1, sodium 138,  potassium 3.2.  Patient supplemented with p.o. potassium. Glucose 98, BUN 8,  creatinine down to 1.4.  BNP 596.9. After cardiac catheterization this a.m.  by Dr. Arvilla Meres, it was felt that the patient would benefit from  further evaluation for possible transplant.  Patient to be discharged here  and transported to [facility] for further workup.  May need dopamine for decreased  BP also.     MB/MEDQ  D:  08/22/2004  T:  08/22/2004  Job:  [id number]

## 2010-11-09 NOTE — Consult Note (Signed)
NAMEBREYER, TEJERA NO.:  [id number]   MEDICAL RECORD NO.:  [id number]          PATIENT TYPE:  EMS   LOCATION:  MAJO                         FACILITY:  [facility]   PHYSICIAN:  Pricilla Riffle, M.D.    DATE OF BIRTH:  05/03/1961   DATE OF CONSULTATION:  02/18/2005  DATE OF DISCHARGE:  02/18/2005                                   CONSULTATION   REFERRING PHYSICIAN:  Dr. Sheppard Penton. [place] [place].   IDENTIFICATION:  Mr. Saffran is a 50 year old gentleman familiar to me from  clinic.  He has a history of a nonischemic cardiomyopathy.  Today, he was  running to catch a bus, became very hot and the next thing he knows, he was  on his knees.  A bystander called 911, tried to stand up, ICD fired once  after falling and once after getting up.  No chest pain.  No shortness of  breath.  Previously, he had been doing well, taking classes.  Note, 3 days  ago, he was lightheaded and Saturday also dizzy and nauseated x1.  Last seen  by Dr. Ardyth Harps 2 months ago, missed last month's appointment by his  report.   ALLERGIES:  None.   MEDICATIONS:  1.  Lasix 40 mg b.i.d.  2.  Coreg 25 mg b.i.d.  3.  Accupril 2.5 mg ? b.i.d.  4.  Pepcid daily.   PAST MEDICAL HISTORY:  1.  Nonischemic cardiomyopathy felt possibly secondary to ETOH, EF estimated      by last report approximately 25%.  Note, the patient was transferred to      [facility] last winter.  He had been on dobutamine      therapy without clinical response with pulmonary edema for transplant      evaluation.  On discussion with Dr. Ardyth Harps today, the patient      actually was listed for a heart.  A heart was available at one point,      but the patient was not reachable, has missed several clinic      appointments after that and taken off the transplant list.  2.  Status post Guidant ICD, February 2006.  3.  History of moderate to moderately severe MR.  4.  History of GE reflux.  5.  History of migraines.  6.  History of mild renal insufficiency.  7.  History of syncope in the past.   SOCIAL HISTORY:  The patient lives in [place], he is divorced, quit ETOH  in 2005, quit tobacco in 2005.   FAMILY HISTORY:  Mother with heart problems and hypertension.  Father  deceased at age 52, ? cause.  No siblings.   REVIEW OF SYSTEMS:  All systems reviewed and negative to the above problem  except as previously described.   PHYSICAL EXAMINATION:  GENERAL:  Patient currently in no acute distress.  VITAL SIGNS:  Blood pressure 126/88, pulse is 87, temperature is 98.4.  NECK:  JVP is normal.  No bruits.  LUNGS:  Clear.  No rales.  CARDIAC:  Regular rate and rhythm.  S1 and  S2.  No S3.  No significant  murmurs.  ABDOMEN:  No hepatomegaly.  EXTREMITIES:  Pulses 2+, no edema.   LABORATORY AND ACCESSORY CLINICAL DATA:  Twelve-lead EKG shows sinus rhythm  at a rate of 86 beats per minute.  T wave inversion noted in leads V4  through V6, I and L, slightly more prominent than previous with inversion in  V4, I and L now.   Labs significant for a hemoglobin of 17 (stable from last admission); BUN  12, creatinine pending, potassium 3.6; MB fraction negative, troponin  negative x1.   IMPRESSION:  Mr. Thueson is a 50 year old with a nonischemic cardiomyopathy  clinically markedly improved from last winter.  By report, he has not been  the most compliant and cooperative in clinic, and was taken off the  transplant listing.   Today, while running to the bus, he had an episode of sinus tachycardia and  was shocked inappropriately and  had an episode of ventricular tachycardia  for which he was shocked.  The Guidant device has been interrogated and with  Dr. Ladona Ridgel, has been reprogrammed, changing the ventricular tachycardia  zones from 180 to 190 and 210 to 230.   We will plan to discharge the patient home, followup already with Dr.  Ardyth Harps on the 6th of September, will make followup with me here in   [place] in 3 weeks, followup also on March 11, 2005, follow up also  with Dr. Ladona Ridgel in November.   ADDENDUM:  Await creatinine, may supplement potassium, based on creatinine.           ______________________________  Pricilla Riffle, M.D.     PVR/MEDQ  D:  02/18/2005  T:  02/19/2005  Job:  [id number]   cc:   Paulino Rily MD   Doylene Canning. Ladona Ridgel, M.D.  [address]  [address]  [place]  [place] [address]

## 2010-11-09 NOTE — Discharge Summary (Signed)
[name], [name] NO.:  [id number]   MEDICAL RECORD NO.:  [id number]          PATIENT TYPE:  INP   LOCATION:  4738                         FACILITY:  [facility]   PHYSICIAN:  Bevelyn Buckles. Bensimhon, MDDATE OF BIRTH:  [birth year]   DATE OF ADMISSION:  07/01/2006  DATE OF DISCHARGE:  07/02/2006                               DISCHARGE SUMMARY   Primary cardiologist is Pricilla Riffle, MD.  Discharging physician is  Bevelyn Buckles. Bensimhon, MD.  EP is Doylene Canning. Ladona Ridgel, MD.   DISCHARGE DIAGNOSES:  1. Acute on chronic systolic congestive heart failure.  2. Nonsustained ventricular tachycardia without firing of implantable      cardioverter-defibrillator this admission.  3. Medical noncompliance.  4. Dietary indiscretion.   PAST MEDICAL HISTORY:  1. Congestive heart failure secondary to nonischemic cardiomyopathy,      ejection fraction 20-25% by echocardiogram in November 2007, status      post cardiac catheterization in 2003 showing no coronary artery      disease.  2. Nonsustained ventricular tachycardia, status post ICD insertion in      2006.  3. Mild renal insufficiency.  4. Gastroesophageal reflux disease.  5. Migraines.  6. Hypertension.  7. Medical noncompliance.   HOSPITAL COURSE:  [name] is a 50 year old African American gentleman  well known to our group, who presented on day of admission complaining  of shortness of breath and abdominal bloating with history of  hypertension and systolic congestive heart failure.  The patient states  he has been compliant with his medications recently.  However, he has  had indiscretion regarding his sodium intake and increased soda  (fluids).  For the last 24-48 hours prior to admission the patient  noticed increased abdominal girth and mild increase in dyspnea on  exertion with symptoms being typical for him with his heart failure.  He  denied any chest discomfort, orthopnea, peripheral edema or cough.  He  was seen by  Dr. [name] initially and admitted for IV diuretic therapy.  On the evening of admission the patient had a 19-beat of ventricular  tachycardia.  He was asymptomatic.  ICD did not fire.  On day of  discharge the patient states he felt much better, blood pressure stable.  Lungs clear.  Magnesium 1.8, potassium 3.9, BUN and creatinine 14 and  1.4.  Patient being discharged home with instructions to continue his  medications as previously.  Dietary education reinforced.   At time of discharge, medications include:  1. Carvedilol 12.5 mg b.i.d.  2. Enalapril 5 mg b.i.d.,  3. Digitek 0.125 mg daily.  4. Potassium 20 mEq daily.  5. Lasix 40 mg b.i.d.  6. A new medication is magnesium oxide 400 mg daily.   I have scheduled the patient a follow-up appointment with Dr. Tenny Craw for  February 11 at 9:45.  The patient has been given the heart failure  discharge instructions.  He is to call our office with any problems if  weight increases 3 pounds within 3 days, increased shortness of breath,  orthopnea, PND, or peripheral edema.   DURATION OF  DISCHARGE ENCOUNTER:  30 minutes.      Dorian Pod, ACNP      Bevelyn Buckles. Bensimhon, MD  Electronically Signed    MB/MEDQ  D:  07/02/2006  T:  07/02/2006  Job:  [id number]

## 2010-11-09 NOTE — H&P (Signed)
[name], WINKELS NO.:  [id number]   MEDICAL RECORD NO.:  [id number]          PATIENT TYPE:  EMS   LOCATION:  ED                           FACILITY:  [facility]   PHYSICIAN:  Verne Grain, MD   DATE OF BIRTH:  Oct 05, 1960   DATE OF ADMISSION:  08/19/2004  DATE OF DISCHARGE:                                HISTORY & PHYSICAL   PRIMARY CARE PHYSICIAN:  Dr. Audria Nine at [facility].   PRIMARY CARDIOLOGIST:  Dr. Tenny Craw, [facility].   CARDIOLOGIST:  Dr. Lewayne Bunting   CHIEF COMPLAINT:  Congestive heart failure status post recent Guidant  single-chamber ICD placement, August 10, 2004, complained of 2 months of  chronic dyspnea, nausea, malaise, abdominal discomfort, fatigue, poor  exercise tolerance, reports decreasing urine output with current dose of  Lasix and creatinine of 1.4, nonischemic cardiomyopathy (ejection fraction  15%), diagnosed with cardiac catheterization 1-2 years ago per patient),  history of alcohol/tobacco/noncompliance but reports he quit all that more  than 6 months ago (per patient), HIV negative (per patient), no illicit drug  use ever (per patient), admission for congestive heart failure/dyspnea March  2004, October 2005, December 2005, February 8 to August 10, 2004, [place]  Heart Association Class III congestive heart failure with ejection fraction  of 15% but narrow QR of 90 msec making the patient currently not a candidate  for biventricular pacing by current CMS standards, reports feeling poorly x  2 months with 2-pillow orthopnea and PND every night, dyspnea on exertion  so bad I can't even tie my shoes without getting short of breath, nausea  with dry heaves and vomiting, anorexia.  The patient reports I just don't  feel like I can do anything anymore.  The patient has had some  cough/congestion over the past 2 days.  Otherwise, his review of systems is  essentially negative.  The chest x-ray done in the [facility]  Emergency  Room reports question of mild pulmonary edema.  BNP is 2130, creatinine  that was 1.4 previously is 1.8 in the [facility] Emergency Room.  And the  patient's EKG is notable for sinus tachycardia at a rate of 120.  The  patient was given 40 mg IV Lasix in the emergency room with about 600 mL of  urinary output.  Lung fields reveal occasional crackles at the bases.  Cardiovascular exam is particularly notable for tachycardia with an S3  gallop.   ALLERGIES/ADVERSE REACTIONS:  NKDA.  Was recently changed from Enalapril to  Diovan for concern that his cough might be related to his ACE inhibitor;  however, he has had no improvement with his cough since this change.   MEDICATIONS:  1.  Coreg 12.5 mg p.o. b.i.d.  2.  Lasix 60 mg p.o. daily.  3.  Diovan 80 mg p.o. daily.   PAST MEDICAL HISTORY:  1.  Congestive heart failure/nonischemic cardiomyopathy with ejection      fraction of 15%, [place] Heart Association Class III on note from      August 10, 2004, QRS duration 90 msec, status post single-chamber  ICD      placement August 10, 2004, by Dr. Lewayne Bunting [facility] II      model number [id number], serial number [id number]), defibrillation threshold 21      joules, R waves of 9 mV.  2.  History of noncompliance/alcohol/tobacco, but reports that he has quit      all these activities for at least greater than the past 6 months.  3.  Status post congestive heart failure admissions for congestive heart      failure/dyspnea, admissions March 2004, October 2005, December 2005,      February 2006.  4.  History of dyspepsia/gastritis/gastric reflux, although the patient does      not report ever having an EGD in the past.   SOCIAL HISTORY:  The patient lives in [place] with his mother.  He is  separated from his previous wife.  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  He quit smoking greater than 6 months ago, quit alcohol use  greater than 6 months ago,  denies any illicit drug use ever in his past.   FAMILY HISTORY:  The patient's mother is alive at age 58 with heart  trouble of unclear etiology.  She has not had any known revascularization  procedures.  She is not known to have heart failure per patient report.  The  patient's father died in his early 64s of unknown causes.  The patient has  no siblings.   REVIEW OF SYSTEMS:  Shortness of breath, dyspnea on exertion, orthopnea,  PND, cough, nausea, vomiting, anorexia, epigastric abdominal discomfort, and  decreased hearing output are as described in the HPI.  The patient reports  no fevers, chills, sweats, weight change, or adenopathy.  He reports no  recent headache.  No acute auditory or visual changes.  He does wear colored  contacts which are currently in place.  He reports no acute rash.  He has no  complaints of palpitations, syncope, claudication, or edema currently.  No  complaints of wheezing.  He reports some decreased urine output, has some  complaints of nausea, vomiting, mild epigastric discomfort.  Bowel movements  are regular per his report.  He reports no skin changes or hair changes  suggestive of endocrinologic abnormality.  All other systems are negative.   PHYSICAL EXAMINATION:  VITAL SIGNS:  Temperature 98.3, heart rate 115,  respiratory rate 18, blood pressure 120/90, oxygen saturation 98% on room  air.  GENERAL:  The patient is in no apparent distress.  He is alert and  cooperative.  HEENT:  Head normocephalic, atraumatic.  Extraocular eye movements are  intact.  Pupils are equal, round, and reactive to light.  The patient's eye  exam is notable for colored contacts in place.  Oropharynx is pink and moist  without lesions.  NECK:  Supple.  The patient has no palpable lymphadenopathy on exam.  CARDIOVASCULAR:  Notable for tachycardia, normal S1 and S2, positive S3  gallop. LUNGS:  Lung fields are mostly clear to auscultation bilaterally.  There are  occasional  crackles noted at the bases.  SKIN:  No rash or other lesions.  ABDOMEN:  Abdominal exam reveals mild epigastric discomfort.  Bowel sounds  are present.  Abdomen in general is soft.  It is not distended.  EXTREMITIES:  No evidence of edema.  Distal pulses are palpable.  NEUROLOGIC:  The patient is alert and oriented, and he answers questions  appropriately.  He has no tremendous difficulty.  Chest x-ray:  Cardiomegaly, question mild edema/mild base.   EKG:  Sinus tachycardia at a rate of 115 with a normal axis.  Normal PR/QRS  and QTc intervals.  There are no pathologic Q waves appreciated.  There are  no ischemic ST-T wave abnormalities.  There is evidence of left atrial  enlargement, and there is evidence of LVH by voltage with accompanying  repolarization abnormalities.   LABORATORY VALUES:  White blood cell count 6.5, hematocrit 51, platelet  count 285, sodium 141, potassium 4.0, chloride 110, bicarb 24, BUN 14,  creatinine 1.8.  Glucose was 109.  Total bilirubin is 1.9 previous total  bilirubin earlier this month was 1.2), AST 29, ALT 21, alk phos 63, total  protein 6.1, albumin 3.2, troponin I less than 0.05 x 2, myoglobin 65, 58,  CK-MB less than 1.0 x 2, anion gap 7, calcium 9.4.   ASSESSMENT/PLAN:  A 50 year old male, nonischemic cardiomyopathy (ejection  fraction 15%), status post single-chamber ICD, August 10, 2004, by Dr.  Lewayne Bunting, on third admission for dyspnea with additional complaints of  nausea, abdominal discomfort, anorexia over the past 2 months, now reporting  decreased urinary output despite compliance with his diet and Lasix at home,  [place] Heart Association Class III/IV, creatinine 1.4 earlier this and  currently up to 1.8.  Jugular venous pressures elevated at approximately 9  cm H2O, sinus tachycardia of approximately 120 with an S3 gallop, BNP 2130,  review of systems notable only for nasal congestion.  1.  Congestive heart failure/nonischemic  cardiomyopathy.  Concerned that      some of patient's reports of compliance with his diet and Lasix regimen      at home with decreasing urine output and increasing creatinine as      described above.  We will check an amylase, lipase, and recheck a GI      panel to look for any gastrointestinal source of his abdominal      discomfort/nausea.  We will empirically increase his proton pump      inhibitor to b.i.d. in light of his complaint of epigastric discomfort.      We will treat his upper respiratory infection symptoms aggressively with      azithromycin, but we will also transfer the patient to [facility] for      low-dose __________ overnight in addition to IV Lasix in an attempt to      optimize his cardiac output from a medical standpoint.  We will order an      echocardiogram for in the morning to assess ejection fraction and assess      position of the patient's new RV lead, exclude any unsuspected     pericardial abnormalities.  We will keep the patient NPO after midnight      should he be considered for a right heart catheterization in the morning      (pending response to medical therapy as previously described).  Goal      negative 1-2 L overnight.  We will recheck creatinine in the morning.      We will continue Coreg for now for after-load reduction, could also      consider additional agents for after load-reduction that do not contain      a beta blocker such as hydralazine or reinitiation of ACE inhibitor if      his creatinine continues to remain stable.  We will hold his Diovan for      now in light  of his elevated creatinine.  2.  Renal.  The patient's creatinine earlier this month was 1.4.  Creatinine      in [facility] Emergency Room is 1.8, concern for possible low flow      state as previously mentioned.  We will hold the patient's Diovan for      now in light of his elevated creatinine and check urinalysis as well as      a FeNa along with urine drug screen.   Although the patient had his ACE      inhibitor changed from Enalapril to Diovan, we are concerned that he      could possibly have a cough related to ACE inhibition.  His cough has      not improved on angiotensin receptor blocker.  I therefore suspect that      ACE inhibitor therapy in the future (presuming his creatinine      stabilizes) would likely be the optimal choice as opposed to angiotensin      receptor blockade at this time.  3.  Gastrointestinal (nausea/epigastric abdominal discomfort).  Increase      proton pump inhibitor to b.i.d. and recheck amylase, lipase, LFTs, TSH,      and free T4 as previously stated.  Follow clinically with above outlined      medical regimen.  4.  Pulmonary/upper respiratory infection symptoms.  We will treat the      patient's symptoms aggressively with azithromycin.  I doubt that the      patient has a bacterial infection; however, in light of his      comorbidities and difficulties, as previously stated, we will treat him      with aggressive therapies in an attempt to exclude any treatable      infectious cause for his current presentation.      DDH/MEDQ  D:  08/19/2004  T:  08/19/2004  Job:  [id number]

## 2010-11-09 NOTE — Assessment & Plan Note (Signed)
[facility]                         ELECTROPHYSIOLOGY OFFICE NOTE   EYOEL, THROGMORTON                         MRN:          [id number]  DATE:06/04/2006                            DOB:          1960/09/06    Mr. Delone returns today for followup.  He is a very pleasant 50-year-  old patient of Dr. Tenny Craw with a history of non-ischemic cardiomyopathy  and severe congestive heart failure and ventricular tachycardia.  He has  a history of problems with noncompliance in the past but recently has  been more compliant and returns today for followup.  He was in the  hospital back in November with heart failure and has improved since  then.  He denies noncompliance.  His medications include Enalapril 10 mg  daily, Coreg 25 twice a day, Digoxin 0.125 daily, and Lasix p.r.n. and  potassium with Lasix.   PHYSICAL EXAMINATION:  The patient on physical examination is a well  appearing young man in no acute distress.  Blood pressure today was  120/90, the pulse 92 and regular, respirations were 18.  The weight was  132 pounds.  NECK:  7 cm jugular venous distention.  There is no thyromegaly.  Trachea is midline.  Carotids are 2+ and symmetric.  LUNGS:  Clear bilaterally to auscultation.  There are no wheezes, rales  or rhonchi.  There is no increased work of breathing.  CARDIOVASCULAR:  Regular rate and rhythm with normal S1 and S2.  There  was a soft S4 gallop present.  EXTREMITIES:  Demonstrated no edema.   Interrogation of his defibrillator demonstrates a Guidant Vitality model  [id number] with R-waves of 19 and an impedence of 489 ohms and threshold of a  volt at 0.5 milliseconds.  The patient had one episode of VT which was  successfully terminated with anti-tachycardic pacing.   IMPRESSION:  1. Non-ischemic cardiomyopathy.  2. Congestive heart failure.  3. Status post ICD insertion.  4. Ventricular tachycardia.   DISCUSSION:  Overall, [name] is stable.  I  have encouraged him to  maintain a low sodium diet and take his medications.  We will plan to  see him back in the office in several months.     Doylene Canning. Ladona Ridgel, MD  Electronically Signed    GWT/MedQ  DD: 06/04/2006  DT: 06/04/2006  Job #: [id number]

## 2010-11-09 NOTE — H&P (Signed)
NAME:  [name], [name] NO.:  [id number]   MEDICAL RECORD NO.:  [id number]          PATIENT TYPE:  EMS   LOCATION:  MAJO                         FACILITY:  [facility]   PHYSICIAN:  Pricilla Riffle, M.D.    DATE OF BIRTH:  [birth year]   DATE OF ADMISSION:  02/18/2005  DATE OF DISCHARGE:  02/18/2005                                HISTORY & PHYSICAL   ADDENDUM:   PENDING LABORATORY WORK RESULTS:  Magnesium 2.2, creatinine 1.2.   DISCHARGE MEDICATIONS:  The patient is being discharged home with a  prescription for KCl 20 mEq one p.o. daily, 30 tablets, with 11 refills.   FOLLOW UP:  1.  He is to follow up with Dr. Ardyth Harps at [facility] on February 27, 2005.  2.  He is to follow up with Dr. Ladona Ridgel at the [facility] on April 25, 2005 at 12:30.  3.  He is to follow up with Dr. Dietrich Pates on March 11, 2005 at 12:30.   DISCHARGE INSTRUCTIONS:  The patient has been given discharge instructions  to continue all his previous medications and follow up appointments and  times and prescription for potassium by the emergency department nurse.      Dorian Pod, NP    ______________________________  Pricilla Riffle, M.D.    MB/MEDQ  D:  02/18/2005  T:  02/18/2005  Job:  [id number]   cc:   Dr. Percell Locus   Dr. Kandace Parkins, M.D.  [address]  [address]  [place]  [place] [address]

## 2010-11-09 NOTE — Discharge Summary (Signed)
[name], [name] NO.:  [id number]   MEDICAL RECORD NO.:  [id number]          PATIENT TYPE:  INP   LOCATION:  3734                         FACILITY:  [facility]   PHYSICIAN:  Doylene Canning. Ladona Ridgel, M.D.  DATE OF BIRTH:  07/08/1960   DATE OF ADMISSION:  08/01/2004  DATE OF DISCHARGE:  08/04/2004                                 DISCHARGE SUMMARY   CARDIOLOGIST:  Dr. Tenny Craw.   ELECTROPHYSIOLOGY SPECIALIST:  Dr. Ladona Ridgel.   PRIMARY CARE PHYSICIAN:  Dr. Audria Nine at [facility].   DISCHARGING DIAGNOSES:  1.  Congestive heart failure exacerbation.  2.  Streptococcal pharyngitis.  3.  Gastritis.  4.  Nonischemic cardiomyopathy, ejection fraction of 10-25%.  5.  Noncompliance with medicine and diet.  6.  Ethanol abuse.  7.  Gastroesophageal reflux disease.  8.  History of nonsustained ventricular tachycardia, pending automatic      implantable cardioverter-defibrillator placement, February 17.   HOSPITAL COURSE:  The patient admitted with dyspnea, abdominal pain,  vomiting and anorexia.  Initial BNP 2062.6.  D-dimer 0.91.  The patient seen  by Dr. Willa Rough in the emergency room, admitted to telemetry.  Cycle  cardiac enzymes.  Diuresed heavily.  EKG sinus rhythm, rate of 110, positive  for summation gallop.  Patient afebrile.  Teaching service notified to  follow primary care, including diarrhea, abdominal pain and throat  discomfort.  The patient stabilized throughout admission.  Adjustments made  in medication.  Diarrhea improved.  Cardiac enzymes negative.  TSH 0.746.  Strep culture positive.  Hemoglobin 14.9, hematocrit 43.9.  CHF improved.   The patient being discharged home with:  1.  Lasix 60 mg p.o. q.a.m. and 40 mg p.o. q.p.m.  2.  Vasotec, increased to 5 mg daily.  3.  Azithromycin 500 mg, the patient has 1 more dose to take on Sunday a.m.  4.  Coreg 12.5 mg p.o. b.i.d.  5.  Protonix 40 mg daily.  6.  Tylenol for general discomfort.   ACTIVITY:  As  tolerated.   DIET:  Low-fat, low-salt.   SPECIAL INSTRUCTIONS:  Tobacco cessation.   FOLLOWUP:  With Dr. Ladona Ridgel for plans for ICD on August 10, 2004.      MB/MEDQ  D:  08/04/2004  T:  08/05/2004  Job:  [id number]   cc:   Pricilla Riffle, M.D.   Maurice March, M.D.  [address] [place]  [place] [address]  Fax: [phone]

## 2010-11-09 NOTE — Op Note (Signed)
[name], [name] NO.:  [id number]   MEDICAL RECORD NO.:  [id number]          PATIENT TYPE:  INP   LOCATION:  6526                         FACILITY:  [facility]   PHYSICIAN:  Doylene Canning. Ladona Ridgel, M.D.  DATE OF BIRTH:  1961/03/21   DATE OF PROCEDURE:  08/10/2004  DATE OF DISCHARGE:                                 OPERATIVE REPORT   PROCEDURE PERFORMED:  Implantation of a single chamber defibrillator with  defibrillation threshold testing.   INTRODUCTION:  The patient is a 50 year old male with a nonischemic  cardiomyopathy diagnosis for many years. Ejection fraction is 15%. He has a  history of class III heart failure. He is now he is now referred for ICD  implantation.   PROCEDURE:  After informed consent was obtained,  the patient is taken to  the diagnostic EP lab in fasting state. After the usual preparation and  draping, intravenous fentanyl and midazolam was given for sedation. 30 mL of  lidocaine was infiltrated in the left infraclavicular region. A 9 cm  incision was carried out over this region. Electrocautery utilized to  dissect down the fascial plane. 10 mL  of contrast was injected in the left  upper extremity venous system demonstrating a patent left subclavian vein.  It was subsequently punctured and the Guidant model [id number] active  fixation defibrillation lead was advanced into the right ventricle. Mapping  was carried out in the right ventricle and at the final site at the RV apex.  The R-waves measured 9 mV. The lead was actively fixed. The pacing threshold  was 0.5 volts of 0.4 milliseconds and the pacing impedance was 617 ohms. 10  volts pacing did not stimulate the diaphragm. At this point this lead was  secured with a figure-of-eight silk suture. In addition, the sewing sleeve  was secured with silk suture. Electrocautery utilized to make subcutaneous  pocket. Kanamycin irrigation was utilized to irrigate the pocket.  Electrocautery  utilized to assure hemostasis. The Guidant Vitality II EL ICD  model T177 serial number [id number] single chamber defibrillator was connected  to the defibrillation lead and placed in the subcutaneous pocket. It was  secured with silk suture. Additional kanamycin irrigation was utilized to  irrigate the pocket and defibrillation threshold testing carried out.   After the patient was more deeply sedated with fentanyl and Versed, VF was  induced with T-wave shock. A 14 joule shock was initially delivered which  failed to terminate VF. A 21 joule shock was then delivered which terminated  the VF and restored sinus rhythm. Five minutes was allowed to elapse and  second DFT test carried out. Again VF was induced with T-wave shock and  again this time a 21 joule shock was delivered which failed to terminate VF.  A 360 joule external shock was then delivered restoring sinus rhythm. Five  additional minutes was allowed to elapse and the polarity of the leads were  reversed. VF was again induced with T-wave shock again a 21 joule shock was  delivered, this time terminating ventricular fibrillation restoring sinus  rhythm. With the satisfactory defibrillation  threshold testing completed,  the incision was closed with layered 2-0 Vicryl followed by layer, 3-0  Vicryl followed by a layer of 4-0 Vicryl. Benzoin was pained on the skin and  Steri-Strips were  applied and a pressure dressing was placed. The patient  was returned to his room in satisfactory condition.   COMPLICATIONS:  There were no immediate procedure complications.   RESULTS:  This demonstrated successful implantation of a Guidant single  chamber defibrillator in a patient with a nonischemic cardiomyopathy,  ejection fraction of 15% and longstanding congestive heart failure presently  class III.      GWT/MEDQ  D:  08/10/2004  T:  08/10/2004  Job:  [id number]   cc:   Pricilla Riffle, M.D.   [facility]

## 2010-11-09 NOTE — Assessment & Plan Note (Signed)
[facility]                            CARDIOLOGY OFFICE NOTE   [name], [name]                         MRN:          [id number]  DATE:05/23/2006                            DOB:          11-23-1960    CARDIOLOGIST:  Pricilla Riffle, MD, [facility].   ELECTROPHYSIOLOGIST:  Doylene Canning. Ladona Ridgel, M.D.   HISTORY OF PRESENT ILLNESS:  [name] is a 50 year old male patient  followed by Dr. Tenny Craw with a history of nonischemic cardiomyopathy,  previous EF of 10-20%.  He was recently hospitalized for acute on  chronic systolic congestive heart failure.  He had been out of some of  his medications for quite some time.  He does have a history of medical  noncompliance.  The patient was diuresed and discharged to home on  May 09, 2006.  He was due to follow up earlier this week but missed  a couple of appointments and came in today for a followup.  He has had  followup blood work done since he was discharged from the hospital.  On  May 13, 2006, his BNP was 616.  His potassium was 4.2, and his  creatinine was 1.3.  He notes today he is doing well.  He denies any  chest pain or shortness of breath.  He denies any syncope, presyncope.  He denies any orthopnea, paroxysmal nocturnal dyspnea.  He denies any  weight gain, denies any swelling.   CURRENT MEDICATIONS:  1. Enalapril 5 mg twice a day.  2. Coreg 25 mg twice a day.  3. Digitek 0.125 mg daily.  4. K-Dur 20 mEq twice a day.  5. Lasix, he is supposed to be on 40 mg twice a day but is unsure how      he is exactly taking this.   PHYSICAL EXAMINATION:  GENERAL:  He is well-developed, well-nourished in  no distress.  VITAL SIGNS:  Blood pressure 110/72, pulse 75, weight 133 pounds.  HEENT:  Unremarkable.  NECK:  Without JVD.  CARDIAC:  Regular rate and rhythm.  LUNGS:  Clear to auscultation bilaterally without wheezing, rhonchi, or  rales.  ABDOMEN:  Soft, nontender.  EXTREMITIES:  Without edema.   Calves are soft and nontender.  SKIN:  Warm and dry.   Electrocardiogram reveals a sinus rhythm with a heart rate of 77, LVH  with repolarization abnormality, no old tracings to compare.   DATABASE:  The patient underwent a 2D echocardiogram during his recent  hospitalization.  This revealed an EF of 20-25% and moderate mitral  regurgitation.  RV systolic function was mildly reduced and there was  moderate tricuspid regurgitation.   IMPRESSION:  1. Chronic systolic congestive heart failure.  2. Nonischemic cardiomyopathy with an ejection fraction of 20-25%.  3. Moderate mitral regurgitation.  4. History of syncope and ventricular tachycardia.      a.     Status post AICD implantation, February 2006.  5. Gastroesophageal reflux disease.  6. History of migraine headaches.   PLAN:  The patient is doing well from a congestive heart failure  standpoint.  He seems  to be euvolemic on exam and symptomatically he is  describing New York Heart Association class I-II symptoms.  His  medication regimen will not be changed today.  I did ask him to call us  back and let us know what he is exactly taking with his Lasix.  We will  recheck a B-MET today to follow up on his renal function and potassium.  I will have him follow up with Dr. Ladona Ridgel as he has not seen him in  quite some time.  We will also get him back to see Dr. Tenny Craw in the next  3 months.  Refer to primary care as he needs to establish and also needs  follow-up on migraine headaches.      [name], PA-C  Electronically Signed      Jonelle Sidle, MD  Electronically Signed   SW/MedQ  DD: 05/23/2006  DT: 05/23/2006  Job #: [id number]

## 2010-11-09 NOTE — Discharge Summary (Signed)
NAMEANTWINE, AGOSTO                            ACCOUNT NO.:  [id number]   MEDICAL RECORD NO.:  [id number]                   PATIENT TYPE:  INP   LOCATION:  4702                                 FACILITY:  [facility]   PHYSICIAN:  C. Ulyess Mort, M.D.             DATE OF BIRTH:  [birth year]   DATE OF ADMISSION:  08/30/2002  DATE OF DISCHARGE:  09/02/2002                                 DISCHARGE SUMMARY   DISCHARGE DIAGNOSES:  1. Nonischemic cardiomyopathy.  2. Congestive heart failure with ejection fraction of 15%.  3. Alcohol abuse.  4. Tobacco abuse.   DISCHARGE MEDICATIONS:  1. Lasix 20 mg p.o. daily if weight is higher than normal.  2. Coreg 6.25 mg one-half pill q.12h.  3. Enalapril 2.5 mg p.o. q.12h.   DIET:  The patient is recommended to remain on a low-salt diet.   SPECIAL INSTRUCTIONS:  The patient was instructed to weigh himself every  day.  If his weight was more than normal, he is to take Lasix.   [facility] [phone]) September 13, 2002 at 2 p.m.  The patient was     instructed that when he registered, that his information is under a     previous name at [facility].  2. The patient was instructed to call [facility] for an appointment within     two weeks.   HISTORY OF PRESENT ILLNESS:  The patient is a 50 year old black male with a  history of nonischemic cardiomyopathy and congestive heart failure who  presented to [facility] Emergency Department with an episode of sharp  substernal chest pain that was pleuritic in nature that occurred with mild  activity approximately 7 a.m. on the morning of admission.  His dyspnea,  dizziness and pain resolved spontaneously prior to arrival at the emergency  department.  He does admit to dyspnea on exertion and review of systems  significant for orthopnea and weight gain of approximately 8 pounds in one  week.  In the past, the patient had been on Toprol-XL 50 mg daily, Mavik 1  mg daily,  spironolactone 25 mg daily and Lasix 20 mg daily but had not taken  the medications in a number of months.   SOCIAL HISTORY:  Social history significant for current tobacco and alcohol  use.  The patient is separated and does not currently have any medical  insurance.  He does not have disability.   PAST MEDICAL HISTORY:  At the time of admission, the past medical history  was vague due to the fact that the patient had difficulty remembering  details of his medical history, although he remembered that he had a cardiac  catheterization that was negative.  Upon further investigation, it was  discovered that he had been admitted approximately a year prior and had a  catheterization by [facility] at that time which showed no coronary  disease but  an ejection fraction of 15%.  He denies any other medical  history.   HOSPITAL COURSE:  The patient was admitted for evaluation and treatment of  his congestive heart failure as well as to rule out acute myocardial  infarction.  As the patient was unable to remember who his cardiology group  was initially, the on-call group of [facility] was consulted, however, we  discovered his alias name and the patient had indeed been evaluated by  [facility] in February of 2003.  [facility] was then consulted and Dr.  Rollene Rotunda visited the patient on September 01, 2002.  Please see dictated  consult note for more details.   There was no evidence for acute myocardial infarction.  The patient had  enzymes x3 that were negative.  Though his history of mildly dilated  cardiomyopathy seems to be consistent of an alcoholic etiology, an HIV test  was ordered and was negative.   The patient received Lasix and I's and O's were followed.  He diuresed well  within two days.  His symptoms significantly improved and he was requiring  no oxygen prior to the time of discharge.  A 2-D echo was obtained on August 31, 2002 which revealed mild dilation of the  left ventricle.  Overall  ventricular systolic function was severely reduced with an ejection fraction  estimated at being 10-20%.  There was also moderate-to-severe mitral  valvular regurgitation (this was not noted on the echo done the previous  year).  The left atrium was moderately dilated and the right ventricle size  was upper limits of normal.   Per recommendations by Dr. Antoine Poche, the patient was reinitiated on  medicines for medical management of his congestive heart failure as  previously described.  It was planned to titrate these medicines as needed  and possibly consider the addition of spironolactone.  The patient had  complete impotence while on his medicines and the spironolactone may be the  medicine of cause.  During the hospitalization, the possibility of  consideration for a possible cardiac transplant arose.  With discussion with  Dr. Antoine Poche, he relayed that the patient's symptoms at this time were not  warranting of a transplant and the patient would certainly need to show  evidence of medical compliance prior to consideration to proceed with the  beginning towards consideration for cardiac transplant.   Prior to discharge, the patient was doing well and feeling well.  He  expressed interest in maintaining compliance with his medicines as  prescribed, although admittedly has difficulty in obtaining his medicines.  He was provided with approximately a two-week supply of medicines by the  [employer] with the assistance of social work.  Prior to discharge,  paperwork also was initiated to assist the patient in obtaining disability  due to severely decreased cardiac function.  On multiple times, it was  reiterated with the patient the severity of his cardiac situation, the  importance of complying with his medications and pursuing regular clinical  followup as well as abstinence from alcohol and tobacco.  The patient was discharged to home with a two-week  supply of medicines in  hand as well as followup and prescriptions for medicines as previously  described.   CONSULTS:  1. [facility].  2. Nutrition.  3. Social work.    COMMENTS:  Of note, the patient was admitted under an alias man of Rosalyn Gess. Roxan Hockey with a medical record number of [id number].  Date of birth  [birth year].  Douglass Rivers, M.D.                      Gary Fleet, M.D.    CH/MEDQ  D:  09/07/2002  T:  09/08/2002  Job:  [id number]   cc:   Pricilla Riffle, M.D. [facility]   [address] [place], [place] [facility]

## 2010-11-09 NOTE — H&P (Signed)
[name], SILVERIA NO.:  [id number]   MEDICAL RECORD NO.:  [id number]          PATIENT TYPE:  INP   LOCATION:  1830                         FACILITY:  [facility]   PHYSICIAN:  Willa Rough, M.D.     DATE OF BIRTH:  11/17/1960   DATE OF ADMISSION:  08/01/2004  DATE OF DISCHARGE:                                HISTORY & PHYSICAL   PRIMARY CARDIOLOGIST:  Pricilla Riffle, M.D.   PRIMARY CARE PHYSICIAN:  Dr. Audria Nine at [facility] [place]   EP DOCTOR:  Doylene Canning. Ladona Ridgel, M.D.   CHIEF COMPLAINT:  Multiple factor dyspnea, abdominal pain, vomiting,  anorexia, and diarrhea.   HISTORY OF PRESENT ILLNESS:  This is a 50 year old African-American male who  does not look stated age, quietly resting in bed currently in the emergency  room, who complains of insomnia over the last few days with increased  dyspnea, restlessness, and abdominal pain.  He states he has been vomiting  after coughing and after eating.  The patient states when he tries to  recline in bed, he experiences chest tightness with increased shortness of  breath and his heart races.  He also complains of fluid in his abdomen and  anorexia, decreased energy level over the last few weeks.  He complains of  abdominal soreness with coughing; however, he also complains of abdominal  discomfort with eating and is also tender to touch.  He states he is having  diarrhea, frequent 8 to 10 loose stools a day over the last few weeks.  Initially, it was very loose.  He says, now, it is just very soft stool.  He  also complains of increased belching and an acid taste in his mouth.   ALLERGIES:  No known drug allergies.   MEDICATIONS:  1.  Lasix which has recently been increased by Pricilla Riffle, M.D., to 60 mg      p.o. q.a.m. and 40 mg p.m.  2.  Coreg 12.5 mg p.o. b.i.d.  3.  Enalapril 2.5 mg daily.  4.  Pepcid p.r.n. which the patient states he has run out of.   PAST MEDICAL HISTORY:  1.   Last LVEF was 10 to 25% ranges.  Last check was 25% in October 2005.      Other past medical history includes nonischemic cardiomyopathy secondary      to ETOH abuse.  2. Class 2 CHF.  3. ETOH abuse.  4. Tobacco abuse.  5.      GERD.  6. Migraines.  7. Syncope secondary to nonsustained ventricular      tachycardia.   The plan per Dr. Lubertha Basque last office note was for patient to receive an ICD  on February 17.   PAST SURGICAL HISTORY:  Right testicular surgery as a child.   SOCIAL HISTORY:  The patient lives in [place] with his mother.  He is  disabled.  He is separated and in the process of divorcing.  He has children  who are alive and well.  He denies any tobacco or ETOH use.  He states his  last alcoholic drink was at Christmas.  He tries to follow a low-sodium  diet.  He denies any drug or herbal medication use.  He frequently  exercises; however, he states in the last month he has not had the energy to  exercise due to the above reasons.   FAMILY HISTORY:  Mother alive with heart disease, unknown etiology, and  hypertension.  Father unknown medical history.  No siblings.   REVIEW OF SYSTEMS:  Positive for fever, sweats, nasal discharge, blurred  vision at times.  CARDIOPULMONARY:  Positive chest pain, dyspnea on  exertion, orthopnea, edema in the abdomen, palpitations, heart racing,  coughing chronic, wheezing occasionally.  GU:  Positive for nocturia 2-3  times a night.  GI:  Positive for nausea, vomiting, diarrhea as stated  above, abdominal pain as stated above.  ENDOCRINE:  Positive for cold  intolerance.   PHYSICAL EXAMINATION:  VITAL SIGNS:  Temperature 98.7, pulse 103,  respirations 25, blood pressure 133/87.  GENERAL:  He is alert and oriented, resting quietly, in no acute distress,  afebrile.  NECK:  Supple without lymphadenopathy, TMJ, or bruit.  He has a trace of  JVD.  CARDIOVASCULAR:  Heart rate S1, S2, with a summation gallop.  Pulses are 2+  and equal.   LUNGS:  He has fine scattered crackles throughout.  ABDOMEN:  Soft, hypoactive bowel sounds.  He is guarding in the right upper  quadrant, tender to palpation, unable to palpate hepatic border.  EXTREMITIES:  No clubbing, cyanosis, or edema noted.  NEUROLOGIC:  He is alert and oriented x 3.  Cranial nerves II-XII are  grossly intact.   DIAGNOSTIC STUDIES:  Chest x-ray shows an increase in peribronchial  thickening with possible surrounding inflammation, negative for infiltrate.  EKG rate of 110, sinus rhythm,      PR 167, QRS 88, QTC 440.  Lab work  showing a white blood cell count of 6.2, hemoglobin 15.8, hematocrit 45.6,  and platelets 311,000.  Sodium 141, potassium 3.9, BUN 14, creatinine 1.6,  glucose 112, lipase 25, BNP 2062.6, D-dimer 0.91.  UA negative.   IMPRESSION AND PLAN:  1.  Dr. Willa Rough in to  see patient, examined, and problem list as      stated above.  The patient in volume overload and abdominal symptoms      with diarrhea.  He is being admitted to the telemetry unit.  We will      diurese him.  Questionable ST inversion in V5 and 6.  However, it might      just be tracing.  We will check enzymes just to rule out.  2.  Also, he has a history of nonsustained ventricular tachycardia pending      ICD placement.  I have already consulted with EP      to evaluate      patient while he is here.  3.  Nonischemic cardiomyopathy with an ejection fraction of 10 to 25%.  4.  History of ETOH abuse.  5.  Syncope secondary to #2.  6.  Nausea, vomiting, diarrhea.  7.  Right upper quadrant tenderness.  I have already called teaching service      and asked them to consult.  I will go ahead and order Zofran for nausea,      Protonix for reflux, culture, guaiac, O&P, and Clostridium difficile      stools.  Also, history of questionable compliance.  The patient      evidently has a  history of noncompliance with diet and     treatment regimen at times in the past.  However, he  assures me that he      has been taking his medicines prior to this admission.  We will let      primary care through teaching service handle the noncardiac-related      issues.  We appreciate their input.      MB/MEDQ  D:  08/01/2004  T:  08/01/2004  Job:  [id number]   cc:   Pricilla Riffle, M.D.   Dr. Bronwen Betters. Ladona Ridgel, M.D.

## 2010-11-09 NOTE — Consult Note (Signed)
NAMEDONTEL, HARSHBERGER NO.:  [id number]   MEDICAL RECORD NO.:  [id number]          PATIENT TYPE:  EMS   LOCATION:  MAJO                         FACILITY:  [facility]   PHYSICIAN:  Charlton Haws, M.D.     DATE OF BIRTH:  September 13, 1960   DATE OF CONSULTATION:  06/01/2005  DATE OF DISCHARGE:                                   CONSULTATION   REFERRING PHYSICIAN:  Dr. Clydene Fake.   REASON FOR CONSULTATION:  [name] was seen in the emergency room early  this morning after having dizziness, syncope and falling.  He has a diffuse  right-sided temporal subarachnoid hemorrhage.   We were asked to see the patient in consultation by Dr. Phoebe Perch.  It is  fairly clear that his dizziness and syncope were related to his heart.  The  patient has a longstanding history of nonischemic cardiomyopathy with an EF  of 15%.  He had a single-lead [employer] defibrillator placed by Dr. Ladona Ridgel  earlier this year in February.  He was on the [facility] heart transplant list for  a while, but due to noncompliance and missing appointments, he was taken off  of it.  He previously was a heavy drinker and smoker; he quit these in 2005.  His nonischemic cardiomyopathy initially was presumed to be secondary to  alcohol.   The patient is a poor historian now; he seems somewhat sleepy; I do not know  if this is related to his subarachnoid hemorrhage or just being up most of  the morning.   As far as I can tell, he has had similar episodes in the past that were  related to VT and his AICD was reprogrammed.   He was at home.  He felt dizzy; he then passed out and hit his head.   He has no chest pain, shortness of breath or palpitations at this moment.   REVIEW OF SYSTEMS:  Review of systems is otherwise benign.   MEDICATIONS:  His medications were supposed to be:  1.  Lasix 40 mg twice daily.  2.  Coreg 25 mg twice daily.  3.  Accupril 2.5 mg twice daily.  4.  Pepcid.   PAST MEDICAL HISTORY:  Past  medical history is also remarkable for:  1.  Mild renal insufficiency.  2.  Migraines.  3.  GE reflux.   SOCIAL HISTORY:  He is divorced.  He lives in [place].  He quit alcohol  and tobacco in 2005.   PHYSICAL EXAMINATION:  VITAL SIGNS:  On exam, blood pressure is  100/palpable.  He is in sinus rhythm at a rate of 86.  There is no  ventricular ectopy.  There is no evidence of heart block.  LUNGS:  His lungs are clear.  HEENT:  He has a contusion in the right temporal aspect of his head.  His  defibrillator is under the left collar bone.  NECK:  His neck is supple.  CARDIAC:  There is an S1 and S2 with an MR murmur.  ABDOMEN:  Benign.  EXTREMITIES:  Lower extremities are intact.  No  edema.   LABORATORY AND ACCESSORY CLINICAL DATA:  EKG shows sinus rhythm with LVH and  strain.   Labs are pending.   His CT scan shows a diffuse temporal-sided subarachnoid hemorrhage.  He is  being admitted by Neurosurgery and going to their unit for monitoring.   IMPRESSION:  I will have the people from [employer] interrogate the device.  It  is not clear whether the device fired, but the patient still had syncope.  In either case, it would appear that the defibrillator may need to be  reprogrammed to intervene earlier so that the patient does not pass out.  For the time-being, we will continue his Coreg and watch him on the monitor  for any recurrent events.   All blood thinners will be avoided due to his subarachnoid hemorrhage.   The patient will have his neurological status followed by Dr. Phoebe Perch and  will be in the neuro-intensive care unit.           ______________________________  Charlton Haws, M.D.     PN/MEDQ  D:  06/01/2005  T:  06/01/2005  Job:  [id number]

## 2010-11-09 NOTE — Assessment & Plan Note (Signed)
[facility]                            CARDIOLOGY OFFICE NOTE   IDA, UPPAL                         MRN:          [id number]  DATE:08/11/2006                            DOB:          28-Feb-1961    IDENTIFICATION:  Mr. Kling is a 50 year old gentleman with a history of  a nonischemic cardiomyopathy. LVEF is severely depressed at  approximately 10% to 20%. He was last seen in cardiology clinic actually  by one of the PA's in November. He was admitted actually on January 8  for volume overload.   Since discharge he has been doing fairly well. He says he is being more  compliant with what he drinks. His appetite has been good, though  yesterday he did not eat much, he says every once and a while he fasts  for a day or two. Denies dizziness, no PND. No nausea.   CURRENT MEDICATIONS:  1. Enalapril 5 b.i.d.  2. Coreg 12.5 b.i.d.  3. Digitek 0.125 q. day.  4. K-Dur 20 mEq b.i.d.  5. Potassium 40 mg b.i.d.  6. Magnesium oxide 400 q. day.   PHYSICAL EXAMINATION:  Patient is in no distress, at rest blood pressure  100/75, on my check 85 systolic, pulse is 109 and regular, on my check  100, weight 137 pounds which is up from 132 in December.  NECK: JVP appeared to be about 8 to 9.  LUNGS: Clear.  CARDIAC EXAM: Regular rate and rhythm, tachy, S1, S2, positive S3.  ABDOMEN: Minimal epigastric tenderness. No hepatomegaly.  EXTREMITIES: No significant edema.   IMPRESSION:  1. Cardiomyopathy, on exam his volume status does not appear to be too      bad but I would like to check a BNP as well as BMET today. It is a      little worrisome about his tachycardia, I think he is taking his      medicines but I will need to follow up. His heart rates are usually      in the 90s when I have seen him. I hope this does not mean he is on      the way to decompensation.  2. I have counseled him on eating pretty regularly. I do not think      fasting for a day is  the right thing to do in his regard.  3. History of mitral regurgitation, moderate, will follow.  4. History of syncope and ventricular tachycardia, status post ICD      February 2006, stable.  5. History of gastroesophageal reflux.  6. History of migraines.   I will set follow-up for 3 months time, sooner if problems develop.     Pricilla Riffle, MD, [facility]  Electronically Signed    PVR/MedQ  DD: 08/11/2006  DT: 08/11/2006  Job #: [id number]

## 2010-11-09 NOTE — Discharge Summary (Signed)
[name], [name]                ACCOUNT NO.:  [id number]   MEDICAL RECORD NO.:  [id number]          PATIENT TYPE:  INP   LOCATION:  3031                         FACILITY:  [facility]   PHYSICIAN:  Valetta Mole. Swords, MD    DATE OF BIRTH:  [birth year]   DATE OF ADMISSION:  09/26/2006  DATE OF DISCHARGE:  09/28/2006                               DISCHARGE SUMMARY   DISCHARGE DIAGNOSES:  1. Acute gastroenteritis with nausea, vomiting, or diarrhea.  2. Chronic congestive heart failure.  3. Possible dehydration.  4. Acute renal insufficiency.  5. Gastroesophageal reflux disease.  6. History of hypertension.  7. History of migraine headaches.   DISCHARGE MEDICATIONS:  His discharge medications are the same as his  admission medications and include:  1. Coreg 12.5 mg b.i.d.  2. Enalapril 5 mg b.i.d.  3. Digitek 0.125 mg daily.  4. Lasix 80 mg b.i.d.  5. Magnesium oxide 400 mg daily.  6. Albuterol MDI p.r.n.  7. Pepcid 20 mg p.o. b.i.d.   FOLLOW UP:  He has an appointment with Dr. Jonny Ruiz on October 03, 2006.   CONDITION ON DISCHARGE:  Improved.  He is ambulating without difficulty  or shortness of breath.  He is tolerating a diet without difficulty.   HOSPITAL PROCEDURES:  1. Abdominal film on September 26, 2006 was normal.  2. Chest film on September 26, 2006 demonstrated mild cardiac enlargement,      chronic basilar scarring, no pulmonary edema.   HOSPITAL LABORATORY DATA:  Urinalysis normal.  BNP at discharge 1634 (on  admission was 2173).  CBC was normal at the time of discharge.  BMET was  normal at the time of discharge.  Note, creatinine of 1.4.  On  admission, creatinine 1.7.   HOSPITAL COURSE:  Problem 1.  The patient was admitted to the hospital  on September 26, 2006.  See Dr. Diamantina Monks note for details.  The patient  presented with a few hour complaints of nausea, vomiting, and diarrhea.  Dr. Felicity Coyer thought this was acute gastroenteritis, and I agree.  I  also thought that perhaps he  was dehydrated, with elevated creatinine  over his baseline.  The patient was gently hydrated.  He improved  quickly, and at the time of discharge, he was tolerating a diet and was  ambulating without difficulty.   Problem 2.  Renal insufficiency.  Creatinine 1.7 on admission and at the  time of discharge was 1.4.  There is record in the chart of baseline  creatinine being 0.4.  That will need to be followed up as an  outpatient.   Problem 3.  Congestive heart failure.  The patient with a history of  nonischemic cardiomyopathy with an EF of 20-25%.  The patient is on  Coreg and ACE inhibitor, and that will be continued.   Problem 4.  History of gastroesophageal reflux disease.  The patient  will resume his Pepcid as an outpatient.   Problem 5.  Teaching.  The patient was seen by nursing.  Reinforced  sodium and fluid restrictions.  The patient understands that he is to  weigh himself daily.  If he gains more than 3 pounds over discharge  weight, he is to call Dr. Jonny Ruiz or Dr. Tenny Craw.      Bruce Rexene Edison Swords, MD  Electronically Signed     BHS/MEDQ  D:  09/28/2006  T:  09/28/2006  Job:  [id number]   cc:   Corwin Levins, MD

## 2010-11-09 NOTE — Discharge Summary (Signed)
[name], [name] NO.:  [id number]   MEDICAL RECORD NO.:  [id number]          PATIENT TYPE:  INP   LOCATION:  6526                         FACILITY:  [facility]   PHYSICIAN:  Maple Mirza, P.A. DATE OF BIRTH:  11-Jan-1961   DATE OF ADMISSION:  08/10/2004  DATE OF DISCHARGE:  08/11/2004                                 DISCHARGE SUMMARY   DISCHARGE DIAGNOSES:  1.  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  2.  Nonischemic cardiomyopathy, ejection fraction 10-25%.  3.  History of nonsustained ventricular tachycardia.  4.  History of syncope.  5.  Class II congestive heart failure with exacerbation, requiring      hospitalization December, 2005.  6.  Gastroesophageal reflux disease.   PROCEDURE:  August 10, 2004, implantation of Guidant cardioverter-  defibrillator by Dr. Lewayne Bunting.   Defibrillation threshold testing was less than or equal to 21 joules,  acceptable V-pacing and sensing.  Patient had no complications  postprocedural and for the next 18 hours he maintained sinus rhythm.  He did  not have particular pain at the site where the cardioverter-defibrillator  was implanted.  He had no cardiac dysrhythmias or respiratory distress.  He  goes home with his preoperative medications which include:  1.  Enalapril 25 mg twice daily.  2.  Pepcid 20 mg twice daily.  3.  Coreg 12.5 mg twice daily.  4.  Furosemide 40 mg daily.  5.  For pain, Tylenol 325 mg 1-2 tabs every 4-6 hours as needed.   He has been given a mobility sheet to describe use of the left upper  extremity for the next 7 days.  He is asked to keep his incision dry for the  next 7 days and sponge bathe until Friday, August 17, 2004.  Will followup  at [facility], [address], [place], ICD clinic, Thursday, August 23, 2004, at 9:00 in the morning.  He will see Dr. Ladona Ridgel in 3 months.  Dr.  Lubertha Basque office will call with that appointment.   BRIEF HISTORY:  The patient is a 50 year old male.  He has a history of  nonischemic cardiomyopathy.  He also experiences palpitations and  nonsustained ventricular tachycardia.  He was admitted to the hospital in  December, 2005, with exacerbation of congestive heart failure.  The patient  was referred on July 25, 2004, for consideration of implantable  cardioverter-defibrillator.  Patient has a history of syncope in the past  and a long history of nonsustained ventricular tachycardia.  His ejection  fraction varies between 10-25% by most echocardiograms in the last year.  He  has frequent palpitations, he denies peripheral edema, he has a chronic  cough.  The risks, benefits, goals and expectations of implantation of this  defibrillator against risk of sudden cardiac death have been explained to  the patient, he wishes to proceed.   HOSPITAL COURSE:  The patient presented electively August 10, 2004.  Implantation of defibrillator, Guidant Vitality model, was placed the same  day.  The patient had no post procedure complications and was discharged  with the medications and followup as dictated above.      GM/MEDQ  D:  08/10/2004  T:  08/12/2004  Job:  [id number]   cc:   Doylene Canning. Ladona Ridgel, M.D.   [facility]

## 2010-11-09 NOTE — H&P (Signed)
NAMEOLSON, LUCARELLI NO.:  [id number]   MEDICAL RECORD NO.:  [id number]          PATIENT TYPE:  INP   LOCATION:  0358                         FACILITY:  [facility]   PHYSICIAN:  Theone Stanley, MD   DATE OF BIRTH:  1961-01-29   DATE OF ADMISSION:  03/29/2004  DATE OF DISCHARGE:                                HISTORY & PHYSICAL   CHIEF COMPLAINT:  Shortness of breath.   HISTORY OF PRESENT ILLNESS:  This is a 50 year old, African-American male  with nonischemic cardiomyopathy with an EF of approximately 15% presenting  with progressive shortness of breath over the past week.  He ran out of his  medication about a month ago and has not been taking his Lasix since then.  He also noted cough in the past 2-3 days with just _______ production.  Also  noted some tightness in the chest for about a week, some palpitations. In  regards to the tightness, there was no radiation, no diaphoresis, no nausea  or vomiting. He does have PND and orthopnea. He has had some chills and a 6-  pound weight gain that he has noticed.   PAST MEDICAL HISTORY:  Nonischemic cardiomyopathy, CHF with an of EF of  approximately 15%, history of alcohol abuse, history of tobacco use, GERD,  migraines.   PAST SURGICAL HISTORY:  Some right testicular surgery as a child.   ALLERGIES:  No known drug allergies.   MEDICATIONS:  1.  Coreg 25 mg q p.o. b.i.d.  2.  Enalapril 5 mg, 1 p.o. b.i.d.  3.  Pepcid 20 mg, 1 p.o. b.i.d.  4.  Lasix 40 mg, 1 p.o. q.d.  5.  Seroquel 50 mg in the morning and 100 mg at night.  6.  Butabarbital 40 mg, 1 p.o. q.i.d. p.r.n. for his migraines.   SOCIAL HISTORY:  He is single, lives in [place], no tobacco, alcohol or  illicit drug use.   FAMILY HISTORY:  Significant for a stroke, hypertension and heart disease.   PHYSICAL EXAMINATION:  VITAL SIGNS:  Blood pressure 129/89, pulse 118,  respiratory rate 22, temperature 97.3, sating 96% on room air.  HEENT:   Atraumatic, normocephalic. Eyes 3 mm, pupils and reactive to light.  Extraocular movements intact. Ears and nose no discharge. Throat no exudate,  clear.  CARDIOVASCULAR:  Regular rate and rhythm, no murmurs, rubs or gallops  appreciated.  LUNGS:  The patient had crackles bilaterally 1/3 of the lung field.  ABDOMEN:  Soft, nontender, nondistended.  EXTREMITIES:  No cyanosis, clubbing or edema.  NEUROLOGIC:  The patient was alert and oriented x3, 5/5 strength in upper  and lower extremities.   LABORATORY DATA AND RADIOLOGY:  White count was 5, hemoglobin 16, hematocrit  of 48, platelets at 349.  Sodium 138, potassium 4.4, chloride 106. CO2 at  26, glucose at 92, BUN at 13, creatinine at 1.4, calcium at 9.4, protein  7.3, albumin of 3.6.  BNP was 1260.  Drug screen was negative. Cardiac  enzymes x2 were negative.   Chest x-ray showed bilateral moderate perihilar air space infiltrates or  edema, borderline cardiomegaly. EKG showed sinus tach, possible left  ventricular hypertrophy.  T wave inversion V4 through V6 but no real changes  compared to his old EKGs.   ASSESSMENT:  Mr. Arboleda is a 50 year old, African-American male with  nonischemic cardiomyopathy presenting with symptoms consistent with  congestive heart failure exacerbation.  1.  Congestive heart failure exacerbation.  This appears to be secondary to      noncompliance which has been an issue previously. At this point in time      will admit him and place him on telemetry. Give him IV Lasix, p.o.      potassium. Repeat his cardiac enzymes and make sure this is not a      myocardial infarction. O2 as needed. Give him a cardiac diet and will      decrease his Coreg in half secondary to his acute congestive heart      failure exacerbation and continue his enalapril.  2.  Gastroesophageal reflux disease.  Continue his Pepcid.  3.  Depression. Continue his Seroquel.  4.  Prophylaxis. Give him Lovenox and Pepcid.       AEJ/MEDQ  D:  03/30/2004  T:  03/30/2004  Job:  [id number]

## 2010-11-09 NOTE — H&P (Signed)
[name], Eric NO.:  [id number]   MEDICAL RECORD NO.:  [id number]          PATIENT TYPE:  INP   LOCATION:  3705                         FACILITY:  [facility]   PHYSICIAN:  Rod Holler, MD     DATE OF BIRTH:  Apr 09, 1961   DATE OF ADMISSION:  05/06/2006  DATE OF DISCHARGE:                                HISTORY & PHYSICAL   PRIMARY CARDIOLOGIST:  Dr. Dietrich Pates.   PRIMARY ELECTROPHYSIOLOGIST:  Dr. Lewayne Bunting.   HISTORY OF PRESENT ILLNESS:  [name] is a very pleasant 50 year old male  patient with a history of nonischemic cardiomyopathy with an EF of 10% to  20%, status post AICD implantation due to a history of ventricular  tachycardia and syncope who presented to the emergency room today with  complaints of increasing dyspnea on exertion and abdominal bloating.  These  symptoms are just like what he has had prior with his congestive heart  failure exacerbations.  He denies any lower extremity edema.  He does note  some two-pillow orthopnea.  Denies any paroxysmal nocturnal dyspnea.  Denies  any syncope.  He denies any chest pain.  He ran out of his enalapril about a  month ago.  His symptoms have been worsening over the last couple of weeks  and he finally decided to come to the emergency room today.   PAST MEDICAL HISTORY:  Significant for nonischemic cardiomyopathy.  An  echocardiogram done in February of 2006 revealed an EF of 10% to 20%,  moderate mitral regurgitation, left atrium moderately dilated, mild-to-  moderate tricuspid regurgitation, moderately increased peak pulmonary artery  systolic pressure and RV dysfunction mild with the appearance of a catheter  in the right ventricle.  He has a history of syncope and ventricular  tachycardia.  He is status post AICD implantation February of 2006.  There  is a history of gastroesophageal reflux disease, migraine headaches, mild  renal insufficiency.  He denies any history of diabetes mellitus.   Denies  any history of hyperlipidemia.  Denies any thyroid disease.  He does have a  history of tobacco and alcohol abuse.   CURRENT MEDICATIONS:  1. Coreg 25 mg 1/2 tablet b.i.d.  2. Enalapril 5 mg daily.  He has been out of this for the last month.  3. Furosemide 40 mg 1-1/2 tablets in the morning, one tablet in the      evening.  4. Pepcid 20 mg as needed.  5. Albuterol as needed.  6. Digitek, unknown dosage.  We suspect it is 125 mcg daily.   ALLERGIES:  No known drug allergies.   SOCIAL HISTORY:  He denies any current tobacco or alcohol abuse.   FAMILY HISTORY:  Insignificant for coronary disease.   REVIEW OF SYSTEMS:  Please see HPI.  Denies any fevers, chills, headaches,  sore throat.  He has had a nonproductive cough.  Denies any dysuria,  hematuria.  Denies any symptoms consistent with TIAs or amaurosis fugax.  He  has had some nausea and vomiting.  He usually has this when he is volume  overloaded.  He does have occasional diarrhea.  Denies any bright red blood  per rectum or melena.  He does have GERD symptoms.  He has occasional  palpitations.  Rest of review of systems are negative.   PHYSICAL EXAMINATION:  GENERAL:  He is a well-nourished, well-developed man  in no distress.  VITAL SIGNS:  Blood pressure is 137/100, pulse 115, respirations 20,  temperature 97.3.  HEENT:  Head:  Normocephalic, atraumatic.  Eyes:  PERRLA.  EOMI.  Sclerae  are clear.  NECK:  Without JVD.  LYMPH:  Without lymphadenopathy.  ENDOCRINE:  Without thyromegaly.  CARDIAC:  Normal S1, S2.  Positive S3.  2/6 systolic ejection murmur heard  best at the apex.  LUNGS:  Decreased breath sounds with bilateral basilar rales.  SKIN:  Without rashes.  ABDOMEN:  Soft, nontender with normoactive bowel sounds, slight  hepatomegaly.  EXTREMITIES:  Without clubbing, cyanosis or edema.  MUSCULOSKELETAL:  Without joint deformity.  NEUROLOGIC:  He is alert and oriented x3.  Cranial nerves II-XII  grossly  intact.   Chest x-ray is without edema.   EKG reveals sinus rhythm with a heart rate of 115, normal axis, with LVH.   LABORATORY DATA:  BNP is 2302, hemoglobin 16.9, white count 4200, platelet  count 280,000, sodium 142, potassium 3.7, BUN 12, creatinine 1.2, glucose  90, total bilirubin 1.4, AST 42, ALT 31, alkaline phos 268, total protein  5.9, albumin 3.4, lipase 24, point-of-care markers negative x2, INR 1.1.   IMPRESSION:  1. Acute and chronic systolic congestive heart failure.  2. Nonischemic cardiomyopathy of 15%.  3. Hypertension.  4. Status post AICD implantation.  5. Gastroesophageal reflux disease.  6. Past history of mild renal insufficiency.   PLAN:  The patient was also interviewed and examined by Dr. Samuel Bouche.  We plan  to admit him to [facility] for further evaluation.  We will also  start IV diuresis and restart his enalapril.  The patient does have a past  history of medical noncompliance.  He had previously been on the transplant  list at [facility], but due to noncompliance was taken off.  We will try to  maximize all of his medications and aim for discharge in the next two to  three days.      [name], PA-C      Rod Holler, MD  Electronically Signed    SW/MEDQ  D:  05/06/2006  T:  05/07/2006  Job:  [id number]

## 2010-11-09 NOTE — H&P (Signed)
NAMETAMARICK, KOVALCIK NO.:  [id number]   MEDICAL RECORD NO.:  [id number]          PATIENT TYPE:  INP   LOCATION:  4738                         FACILITY:  [facility]   PHYSICIAN:  Madolyn Frieze. [name], MD, FACCDATE OF BIRTH:  Aug 18, 1960   DATE OF ADMISSION:  07/01/2006  DATE OF DISCHARGE:                              HISTORY & PHYSICAL   PRIMARY CARDIOLOGIST:  Pricilla Riffle, MD, [facility].   ELECTROPHYSIOLOGIST:  Doylene Canning. Ladona Ridgel, MD.   CHIEF COMPLAINT:  Shortness of breath and abdominal bloating.   HISTORY OF PRESENT ILLNESS:  [name] is a 50 year old male patient  with a history of ischemic cardiomyopathy of 20-25% by echocardiogram in  November 2007, who presents with a 1-day history of abdominal pain and  bloating associated worsening dyspnea on exertion.  He notes dietary  indiscretion with increased salt this past weekend.  He also notes  intermittent increased fluid intake with soft drinks.  Yesterday he  developed epigastric abdominal pain and bloating.  He had poor appetite  with this.  These are his usual symptoms with volume overload.  He notes  that he has chronic dyspnea on exertion.  Symptoms worsened yesterday.  He denies any shortness of breath at rest.  Denies any orthopnea or  paroxysmal nocturnal dyspnea.  He does not weigh himself regularly.  He  does not know if he has gained any weight.  He has never had lower  extremity edema.  He denies any chest pain.  He did have episodic  diaphoresis last night.  He decided to come to the emergency room today  for further evaluation.   PAST MEDICAL HISTORY:  Significant for:  1. Nonischemic cardiomyopathy with EF of 20-25%.  He apparently had a      cardiac catheterization in 2003 according to the records which      showed no coronary artery disease.  He had previously been on the      [facility] transplant list but was taken off secondary to medical      noncompliance.  2. History of mild renal insufficiency.  3.  Gastroesophageal reflux disease.  4. History of migraine headaches.  5. Hypertension.  6. Echocardiogram November 2007 revealed an EF of 20-25%, moderate      mitral regurgitation. Mitral regurgitation volume by PISA 17 mL,      left atrial enlargement, mild artery dysfunction, moderate      tricuspid regurgitation.   MEDICATIONS:  1. Enalapril 5 mg twice a day.  2. Coreg 25 mg 1/2 tablet twice a day.  3. Digitek 0.125 mg daily.  4. K-Dur 20 mEq twice daily.  5. Lasix 40 mg twice daily.  6. Pepcid, according to the records  7. Albuterol, according to the records.   ALLERGIES:  No known drug allergies.   SOCIAL HISTORY:  The patient has a past history of tobacco or alcohol  abuse.  He denies any current tobacco or alcohol abuse.   FAMILY HISTORY:  Is insignificant for premature coronary disease.   REVIEW OF SYSTEMS:  Please see HPI.  Denies any fevers, chills.  He has  chronic migraine headaches, and there have been no changes.  Denies any  sore throat, chest pain,  orthopnea, paroxysmal nocturnal dyspnea,  syncope, near syncope, claudication, cough, dysuria, hematuria, nausea,  vomiting, diaphoresis, bright red blood per rectum, melena.  He has had  abdominal bloating with decreased appetite.  The rest of Review of  Systems are negative.   PHYSICAL EXAMINATION:  GENERAL:  He is a well-nourished, well-developed  male in no acute distress.  VITAL SIGNS:  Blood pressure is 114/85. Pulse on admission was 01/09,  respirations 20, oxygen saturation 98% on room air.  Temperature 97.  HEENT:  Head normocephalic and atraumatic.  Eyes: PERRLA.  EOMI.  Sclerae clear.  NECK:  Without JVD, without lymphadenopathy, without thyromegaly,  without carotid bruits bilaterally.  CARDIAC: S1 and S2, regular rate and rhythm with a 2/6 systolic ejection  murmur heard best along the left sternal border apex.  Positive S3.  LUNGS:  Decreased breath sounds with bibasilar crackles, otherwise  clear.   No wheezes.  ABDOMEN: Normoactive bowel sounds, distended, nontender.  EXTREMITIES:  Without clubbing, cyanosis, or edema.  Calves are soft and  nontender.  SKIN:  Warm and dry.  PULSES: Dorsalis pedis and posterior tibialis pulses 2+ bilaterally.  NEUROLOGIC: He is alert and oriented x3.  Cranial nerves II-XII grossly  intact.   Electrocardiogram reveals sinus rhythm, heart rate of 99, LVH with  repolarization abnormality. biatrial enlargement.   Laboratory data:  Sodium 138, potassium 5, chloride 107, BUN 10,  creatinine 1.3, glucose 107, digoxin 0.3.  BNP 1374.   Chest x-ray shows cardiomegaly, no edema.   IMPRESSION:  1. Acute on chronic systolic congestive heart failure.  2. Ischemic cardiomyopathy with ejection fraction of 20-25%.  3. History of syncope and ventricular tachycardia status post      automated implantable cardioverter-defibrillator implantation      September 2006.  4. Hypertension.  5. Gastroesophageal reflux disease.  6. Past history of mild renal insufficiency.  7. Moderate mitral regurgitation.  8. History of medical noncompliance.   PLAN:  The patient was also interviewed and examined by Dr. [name].   1. Plan to admit him for IV diuresis.  2. Will continue his other home medications.  3. Will also check liver enzymes, amylase and lipase given his      abdominal pain.  4. Will also reduce potassium supplementation given his borderline      hyperkalemia.  5. He will likely require admission for 24-48 hours.      [name], PA-C      Madolyn Frieze. [name], MD, [name]  Electronically Signed    SW/MEDQ  D:  07/01/2006  T:  07/01/2006  Job:  [id number]

## 2010-11-09 NOTE — H&P (Signed)
NAMEJHONNIE, [name]                ACCOUNT NO.:  [id number]   MEDICAL RECORD NO.:  [id number]          PATIENT TYPE:  INP   LOCATION:  0348                         FACILITY:  [facility]   PHYSICIAN:  Toby L. Catalina Pizza, M.D.   DATE OF BIRTH:  1960-10-28   DATE OF ADMISSION:  06/18/2004  DATE OF DISCHARGE:                                HISTORY & PHYSICAL   REASON FOR VISIT:  Increased swelling and shortness of breath.   HISTORY OF PRESENT ILLNESS:  [name] is a 50 year old African-American  male who presents to the ED with a three-day history of increased shortness  of breath and swelling. This patient had a long history of noncompliance  with his CHF medications. He says that he stopped taking his CHF medications  about one to two weeks ago and subsequently gradually developed increased  shortness of breath and swelling. He also complained of sharp chest pain.  This pain developed mainly when he takes deep breaths. There is no  radiation. No relationship with exertion. The patient has had a dry cough  over the past week; however, sometimes there is clear sputum production. No  fevers or chills.   PAST MEDICAL HISTORY:  1.  Nonischemic cardiomyopathy. This is likely due to ETOH.  2.  CHF with an EF of 25% noted in October 2005.  3.  Alcohol abuse.  4.  Tobacco abuse.  5.  GERD.  6.  Migraines.   PAST SURGICAL HISTORY:  Right testicular surgery as a child.   ALLERGIES:  None.   MEDICATIONS:  1.  Enalapril 5 mg p.o. q.d.  2.  Coreg 25 mg p.o. b.i.d.  3.  Lasix 40 mg p.o. q.d.  4.  Butalbital 40 mg p.o. q.i.d. p.r.n.  5.  Amitriptyline 50 mg p.o. q.h.s.  6.  Effexor 75 mg 1 p.o. q.d.  7.  Lexapro 10 mg 1 p.o. q.d.  8.  Seroquel 25 mg 1 p.o. b.i.d.   SOCIAL HISTORY:  The patient is single. Lives in [place]. He denies  alcohol, tobacco, and IV drug abuse. Of note, the patient does have a  history of heavy alcohol use.   FAMILY HISTORY:  There is a family history significant  for CVA,  hypertension, and coronary artery disease.   REVIEW OF SYSTEMS:  Complete review of systems was performed. All systems  were negative except as stated in HPI.   PHYSICAL EXAMINATION:  VITAL SIGNS:  Temperature is 96.6, blood pressure  131/98, pulse 110, respirations 16.  HEENT:  Pupils are equal, round, and reactive to light. Extraocular  movements were intact. There was no scleral icterus. Oropharynx was clear  and slightly dry. There was no erythema or thrush. Tympanic membranes were  clear bilaterally.  NECK:  Minimal JVD. No adenopathy. No carotid bruit.  HEART:  Regular rate and rhythm. No murmurs, rubs, or gallops.  LUNGS:  Clear to auscultation bilaterally. No wheezes, rales or rhonchi.  ABDOMEN:  Positive bowel sounds, nontender, nondistended.  EXTREMITIES:  Trace edema.   LABORATORY DATA:  Sodium 142, potassium 5.9, chloride 111, carbon dioxide  26, glucose 94, BUN 14, creatinine 1.7, calcium 9.4, total protein 6.0, AST  36, ALT 43, alkaline phosphatase 62, total bilirubin 1.2. White blood cell  count 5.7, hemoglobin 16.5, hematocrit 49.3, platelets 333. Beta natriuretic  peptide 1,190. Troponin I less than 0.05. EKG did show inverted T waves in  leads 5 and 6. This is unchanged compared to previous EKG from March 29, 2004. The patient does remain tachycardic.   Chest x-ray was significant for new cardiomegaly. There is no edema noted.   ASSESSMENT/PLAN:  1.  Exacerbation of congestive heart failure. This is nonischemic in nature.      This is likely due to the patient's previous heavy alcohol abuse. I will      admit the patient to telemetry bed. He will provided with Lasix 40 mg IV      b.i.d. In addition, I will continue his ACE inhibitor. Of note, the      patient does have hyperkalemia. This hyperkalemia is likely related to      the minimal acute renal failure secondary to CHF. Hopefully, this will      be resolve with diuresis. I will monitor cardiac  enzymes. In the      morning, I will obtain a BNP and repeat EKG.  2.  Sharp chest pain. This is likely related to the patient's resolving      viral illness. As above. We will check cardiac enzymes and repeat an      EKG. There are no new changes on present EKG.  3.  Hyperkalemia. There are no EKG changes. As stated above, this is      probably related to the patient's congestive heart failure/acute renal      failure. I will repeat a BNP in the a.m. If there is no improvement in      the hyperkalemia, I may consider treatment with Kayexalate.  4.  The patient is full code.      TLF/MEDQ  D:  06/19/2004  T:  06/19/2004  Job:  [id number]

## 2010-11-09 NOTE — Consult Note (Signed)
[name], [name] NO.:  [id number]   MEDICAL RECORD NO.:  [id number]          PATIENT TYPE:  EMS   LOCATION:  MAJO                         FACILITY:  [facility]   PHYSICIAN:  Madolyn Frieze. [name], MD, FACCDATE OF BIRTH:  [birth year]   DATE OF CONSULTATION:  08/20/2006  DATE OF DISCHARGE:  08/20/2006                                 CONSULTATION   CONSULTATIONS:  EMERGENCY ROOM   CARDIOLOGIST:  Pricilla Riffle, MD, [facility]   PRIMARY ELECTROPHYSIOLOGIST:  Doylene Canning. Ladona Ridgel, M.D.   HISTORY OF PRESENT ILLNESS:  [name] is a 50 year old male patient  followed by Dr. Tenny Craw with a history of nonischemic cardiomyopathy with  an EF of 20% to 25% who presents to the emergency room today with  complaints of increasing abdominal girth and shortness of breath with  exertion over the last 3 days.  He has gained about 2 pounds.  He has  missed enalapril x1 dose.  He does admit to salt indiscretion.  He  denies any chest pain.  He does note some orthopnea as well as  paroxysmal nocturnal dyspnea.  He has had a dry cough.  Denies any  hemoptysis.  Denies any syncope.  His defibrillator has not discharged.  His chest x-ray shows borderline cardiomegaly with mild perihilar  interstitial infiltrates or edema.  We are asked by the ER physician to  further evaluate.   PAST MEDICAL HISTORY:  1. Significant for chronic systolic congestive heart failure.  He has      nonischemic cardiomyopathy with an EF of 20% to 25%.  2. History of syncope with ventricular tachycardia subsequently status      post AICD implantation September 2006.  3. Hypertension.  4. Gastroesophageal reflux disease.  5. Past history of mild renal insufficiency.  6. History of moderate mitral regurgitation.  7. History of medical noncompliance.  8. History of migraine headaches.   CURRENT MEDICATIONS:  1. Coreg 12.5 mg b.i.d.  2. Enalapril 5 mg b.i.d.  3. Furosemide 40 mg b.i.d.  4. Digoxin 0.25 mg daily.   ALLERGIES:  NO KNOWN DRUG ALLERGIES.   SOCIAL HISTORY:  The patient has a past history of tobacco and alcohol  abuse.  He denies any current tobacco or alcohol abuse.   FAMILY HISTORY:  Insignificant for premature coronary artery disease.   REVIEW OF SYSTEMS:  Please see HPI.  He has had some episodic  diaphoresis.  He has also had some migraine headaches, which is typical  for him.  He denies any fever, chills.  Denies any melena, hematochezia,  hematuria, dysuria.  The review of systems are negative.   PHYSICAL EXAMINATION:  GENERAL:  He is a well-nourished, well-developed  male in no distress.  VITAL SIGNS:  Blood pressure is 132/102, temperature 97, pulse 105,  respirations 18, oxygen 97% on room air.  HEENT:  Head is normocephalic, atraumatic.  Eyes; Pupils equal, round,  and reactive to light and accommodation.  EMOI.  Sclerae clear.  NECK:  Without JVD.  LYMPH:  Without lymphadenopathy.  CARDIAC:  Normal S1, S2.  Positive S3.  LUNGS:  Clear to auscultation.  ABDOMEN:  Soft, nontender.  Normoactive bowel sounds.  No organomegaly.  EXTREMITIES:  Without edema.  Calves are soft and nontender.  SKIN:  Warm and dry.  NEUROLOGIC:  He is alert and oriented x3.  Cranial nerves II-XII are  grossly intact.   LABORATORY DATA:  Electrocardiogram reveals sinus tachycardia with heart  rate of 113.  Normal axis.  LVH with repolarization abnormality.  No  significant changes since previous tracings.   IMPRESSION:  1. Mild acute on chronic systolic congestive heart failure.  2. Nonischemic cardiomyopathy with an ejection fraction of 20% to 25%.  3. Normal cardiac catheterization 2003.  4. History of nonsustained ventricular tachycardia in the setting of      syncope status post automatic implantable cardioverter      defibrillator implantation.  5. History of mild renal insufficiency.  6. Gastroesophageal reflux disease.  7. History of migraine headaches.  8. Hypertension.  9.  History of medical noncompliance.   PLAN:  The patient was also interviewed and examined by Dr. [name].  The patient admits to eating frozen dinners as well as Doritos and soft  drinks.  He does have some symptoms of increased dyspnea with exertion  as well as orthopnea and increased abdominal girth, which is usually  where he collects his fluid.  He has only missed one day of enalapril.  His heart failure is mild.  He does not have any significant edema on  chest x-ray.  We will given him 80 mg of IV Lasix in the emergency room.  We will also check a BMET and BMP.  As long as his laboratory values are  not significantly abnormal, he will be discharged home from the  emergency room.  We will ask him to increase his Lasix to 80 mg in the  morning and 40 mg in evening.  He recently came off of his potassium  supplementation secondary to hyperkalemia per his report.  The patient  had a potassium of 5.5 on February 18.  His blood work in our office  today revealed a potassium of 4.7.  His creatinine is stable at 1.2.  His BNP back on February 18 was 913.  Therefore, we will not supplement  his potassium at this point in time.  We will get a followup  BMET on Monday of next week.  We will also get him set up for an  appointment in the office early next week with either myself or Dr.  Tenny Craw.  The patient has been asked to weigh himself daily.  He knows that  if he gains 2 pounds in 1 day he should take an extra 40 mg of Lasix  that day.      [name], PA-C      Madolyn Frieze. [name], MD, [facility]  Electronically Signed    SW/MEDQ  D:  08/20/2006  T:  08/21/2006  Job:  [id number]

## 2010-11-09 NOTE — Cardiovascular Report (Signed)
[name], [name] NO.:  [id number]   MEDICAL RECORD NO.:  [id number]          PATIENT TYPE:  INP   LOCATION:  4734                         FACILITY:  [facility]   PHYSICIAN:  Arvilla Meres, M.D. LHCDATE OF BIRTH:  1960/10/15   DATE OF PROCEDURE:  08/22/2004  DATE OF DISCHARGE:                              CARDIAC CATHETERIZATION   PRIMARY CARE PHYSICIAN:  Maurice March, M.D., [facility].   CARDIOLOGIST:  Pricilla Riffle, M.D.   PATIENT IDENTIFICATION:  [name] is a very pleasant 50 year old male with  a two to three-year history of a nonischemic cardiomyopathy; with multiple  admissions over the past year and a half for decompensated heart failure.  He is brought to the cardiac catheterization lab for right heart  catheterization.   PROCEDURES PERFORMED:  Right heart catheterization with FICK cardiac  outputs.   DESCRIPTION OF PROCEDURE:  The risks and benefits of the procedure were  explained to Mr. Hockenberry.  Consent was signed and placed on the chart.  The  right groin area was prepped and draped in routine sterile fashion.  Area  was then anesthetized with 1% local lidocaine.  A venous sheath was placed  in the right femoral vein, using a modified Seldinger technique.  Standard  Swan-Ganz catheters were used for the procedure.  There were no apparent  complications.   Of note, the procedure was done with the patient off milrinone for greater  than 12 hr.   FINDINGS:  1.  Right atrial pressure:  Mean 8.  2.  Right ventricle pressure:  44/6.  3.  PA pressure:  47/32 with mean 38.  4.  Pulmonary capillary wedge pressure:  24.  5.  FICK cardiac output:  2.4 L/min.  6.  FICK cardiac index:  1.5 L/min sq m.  7.  Mixed venous saturation:  57%.  8.  Femoral artery saturation:  95%.  9.  PDR:  5.8 Wood's units.   IMPRESSION:  Severe cardiomyopathy with decreased cardiac index and mildly  increased filling pressures.  At this point will restart  his milrinone and  Lasix.  He may need dopamine as well to support his blood pressure.   I have spoken with Dr. Paulino Rily at [facility] on the cardiac transplant  service.  We will transfer him to [facility] for further evaluation and for  possible heart transplant, or ventricular assist device.  I spoke with Mr.  Galentine regarding the possible transfer, and he is in agreement.      DB/MEDQ  D:  08/22/2004  T:  08/22/2004  Job:  [id number]   cc:   Maurice March, M.D.  [address] [place]  [place] [address]  Fax: [phone]   Pricilla Riffle, M.D.

## 2010-11-09 NOTE — Discharge Summary (Signed)
NAMEWINSTON, MISNER NO.:  [id number]   MEDICAL RECORD NO.:  [id number]          PATIENT TYPE:  INP   LOCATION:  3007                         FACILITY:  [facility]   PHYSICIAN:  Clydene Fake, M.D.  DATE OF BIRTH:  11-13-1960   DATE OF ADMISSION:  06/01/2005  DATE OF DISCHARGE:  06/05/2005                                 DISCHARGE SUMMARY   DIAGNOSES:  1.  Left temporal contusion.  2.  Syncope.  3.  Severe cardiac disease.   DISCHARGE DIAGNOSIS:  1.  Left temporal contusion.  2.  Syncope.  3.  Severe cardiac disease.   PROCEDURE:  None.   REASON FOR ADMISSION:  The patient is a 50 year old woman with significant  history of cardiac disease with implanted defibrillator and history of  syncope, bad ejection fraction, dizziness, falling and hitting her head.  Brought to the hospital and found to have small left temporal contusion.   HOSPITAL COURSE:  There was a contusion with no mass effect.  The patient  was awake and alert, neurologically intact.  We admitted her for observation  and cardiology was consulted.  The patient remained neurologically stable  but watched overnight in the ICU, then transferred to the ACU on 10th.  On  the 11th, transferred to telemetry on the floor.  Repeat CT showed left  central hemorrhage with a little edema around the small contusion, no mass  effect.  Cisterns were open.  At this point, she was cleared neurosurgically  but cardiology was still watching her and had her on telemetry and were  changing her medications.  By the 13th, they felt she was stable enough to  be discharged to home.   DISCHARGE MEDICATIONS:  Lasix 60 mg b.i.d. which is an increase in her dose.  Lisinopril was discontinued.  Coreg was decreased to 12.5 b.i.d.  Pravastatin 20 mg daily.  BiDil 5 mg t.i.d.  Lanoxin 0.25 daily.   Follow up with neurosurgery p.r.n.  Follow up with cardiology in a few  weeks.           ______________________________  Clydene Fake, M.D.     JRH/MEDQ  D:  10/29/2005  T:  10/30/2005  Job:  [id number]

## 2010-11-09 NOTE — Discharge Summary (Signed)
[name], [name] NO.:  [id number]   MEDICAL RECORD NO.:  [id number]          PATIENT TYPE:  INP   LOCATION:  0348                         [facility]ACILITY:  [facility]   PHYSICIAN:  Danae Chen, M.D.DATE OF BIRTH:  [birth year]   DATE OF ADMISSION:  06/18/2004  DATE OF DISCHARGE:  06/21/2004                                 DISCHARGE SUMMARY   DISCHARGE DIAGNOSES:  1.  Congestive heart failure secondary to medical noncompliance.  2.  Nonischemic cardiomyopathy.  3.  Prior history of alcohol abuse.  4.  Reflux disease.  5.  Migraines.   DISCHARGE MEDICATIONS:  1.  Enalapril 5 mg p.o. daily.  2.  Coreg 25 mg p.o. b.i.d.  3.  Lasix 40 mg p.o. daily.  4.  Amitriptyline 50 mg p.o. q.h.s.  5.  Effexor 75 mg p.o. daily.  6.  Lexapro 10 mg p.o. daily.  7.  Seroquel 25 mg p.o. daily.   CONSULTS:  No consults.   PROCEDURE:  No procedures.   BRIEF HOSPITAL COURSE:  The patient is a 50 year old with the above listed  medical problems including a nonischemic cardiomyopathy most likely related  to alcohol use. His last ejection fraction October 2005 was 25%. He was  admitted after being noncompliant with his medications for approximately one  to two weeks prior to his admission. He developed shortness of breath and  some peripheral edema. He was restarted on his medications and had cardiac  enzymes as well to rule out an acute infarct. These were negative. His  weight at the time of admission was 136 pounds. At the time of discharge, it  was 131 pounds. He did have a 6-beat run of nonsustained ventricular  tachycardia overnight which was asymptomatic. He has an appointment with his  cardiologist, he reports, on January 5.   CONDITION ON DISCHARGE:  Improved.   PHYSICAL EXAMINATION:  VITAL SIGNS:  He is afebrile. Blood pressure is  112/75, pulse 89 and regular. O2 saturation was 97% on room air. As noted,  his discharge weight is 131 pounds.   LABORATORY DATA:   Sodium 141, potassium 3.8, CO2 31, glucose 90, BUN 13,  creatinine 1.6. BNP was 768 at the time of discharge. It was 1,440 at the  time of admission.   DISPOSITION:  Did discuss at length with the patient regarding the  importance of being compliant with his medications. He says he does  understand this. Also advised him to seek help for any problems he may have  with addiction for alcohol. He is to follow up and keep his appointment with  his cardiologist on January 5. At that time,  they can ascertain whether he needs a Holter monitor to assess for any other  events. If his cardiomyopathy does progress, he is at increased risk for  sudden cardiac arrhythmia, and so he may be a potential candidate for  defibrillator.     Rame   RLK/MEDQ  D:  06/21/2004  T:  06/21/2004  Job:  [id number]   cc:   [facility]

## 2010-11-09 NOTE — Discharge Summary (Signed)
[name], [name] NO.:  [id number]   MEDICAL RECORD NO.:  [id number]          PATIENT TYPE:  INP   LOCATION:  3705                         FACILITY:  [facility]   PHYSICIAN:  Eric Riffle, MD, FACCDATE OF BIRTH:  05/12/1961   DATE OF ADMISSION:  05/06/2006  DATE OF DISCHARGE:  05/09/2006                               DISCHARGE SUMMARY   PRINCIPAL DIAGNOSIS:  Acute on chronic systolic congestive heart  failure.   SECONDARY DIAGNOSES:  1. Non-ischemic cardiomyopathy, EF of 10-20%.  2. Moderate mitral regurgitation.  3. History of syncope and ventricular tachycardia.  4. Status post AICD implantation February 2006.  5. Gastroesophageal reflux disease.  6. History of migraine headaches.  7. Mild renal insufficiency.  8. History of medical non-adherence.   ALLERGIES:  NO KNOWN DRUG ALLERGIES.   PROCEDURES:  None.   HISTORY OF PRESENT ILLNESS:  A 50 year old male with prior history of  nonischemic cardiomyopathy with an EF of 10-20%.  He was in his usual  state of health until relatively recently, when he began to experience  increasing dyspnea on exertion, two-pillow orthopnea and increasing  abdominal girth, prompting him to present to the [facility] ED on  May 06, 2006.  On admission, his BNP was 2,302 and his cardiac  markers  negative.  He was admitted for IV diuresis.   HOSPITAL COURSE:  He was initially placed on Lasix 80 mg IV b.i.d. with  relatively brisk diuresis and symptomatic improvement.  His Lasix was  decreased and eventually converted to p.o. Lasix this morning.  We  happened to add potassium replacement to his medical regimen, and he did  have mild hypokalemia, and a right with a K of 3.6 on November 15th.  His creatinine has remained stable throughout his hospitalization, and  revealed we have reinitiated his Coreg, ACE inhibitor and digoxin  therapy.  He is being discharged home today in satisfactory condition.   DISCHARGE LABS:   Hemoglobin 16.9, hematocrit 50.0, WBC 4.2, platelets  280, sodium 140, potassium 4.1, chloride 108, CO2 24, BUN 9, creatinine  1.2, glucose 95, total bilirubin 1.4, alkaline phosphatase 68, AST 42,  ALT 31, total protein 5.9, albumin 3.4, calcium 8.7, CK 79, MB 0.8,  troponin-I 0.03, discharge BNP 1,014, digoxin less than 0.2.   DISPOSITION:  The patient is being discharged home today in good  condition.   FOLLOWUP PLANS AND APPOINTMENTS:  He has a follow-up appointment Dr.  Tenny Craw, his nurse practitioner or PA on November 26th at 9:45 a.m.  He is  advised to obtain a primary care physician.   DISCHARGE EDUCATION:  He has been counseled on the importance of daily  weights, as well as sodium restriction.  He is advised to report any  weight gain greater than 2 pounds in 1 day of 5 pounds in 1 week.   DISCHARGE MEDICATIONS:  1. Enalapril 5 mg b.i.d.  2. Coreg 12.5 mg b.i.d.  3. Digitek 0.125 mg q.d.  4. K-Dur 20 mEq b.i.d.  5. Lasix 40 mg b.i.d.   OUTSTANDING LAB STUDIES:  None.  DURATION OF DISCHARGE ENCOUNTER:  40 minutes including physician time.      [name], [name]      Eric Riffle, MD, [facility]  Electronically Signed    CB/MEDQ  D:  05/09/2006  T:  05/09/2006  Job:  [id number]   cc:   Eric Riffle, MD, [facility]

## 2010-11-16 ENCOUNTER — Ambulatory Visit (INDEPENDENT_AMBULATORY_CARE_PROVIDER_SITE_OTHER): Payer: Medicare Other | Admitting: *Deleted

## 2010-11-16 ENCOUNTER — Ambulatory Visit (HOSPITAL_COMMUNITY): Payer: Medicare Other | Attending: Internal Medicine | Admitting: Radiology

## 2010-11-16 ENCOUNTER — Other Ambulatory Visit (INDEPENDENT_AMBULATORY_CARE_PROVIDER_SITE_OTHER): Payer: Medicare Other | Admitting: *Deleted

## 2010-11-16 ENCOUNTER — Other Ambulatory Visit (HOSPITAL_COMMUNITY): Payer: Self-pay | Admitting: Radiology

## 2010-11-16 DIAGNOSIS — I379 Nonrheumatic pulmonary valve disorder, unspecified: Secondary | ICD-10-CM | POA: Insufficient documentation

## 2010-11-16 DIAGNOSIS — I059 Rheumatic mitral valve disease, unspecified: Secondary | ICD-10-CM | POA: Insufficient documentation

## 2010-11-16 DIAGNOSIS — I8290 Acute embolism and thrombosis of unspecified vein: Secondary | ICD-10-CM

## 2010-11-16 DIAGNOSIS — I079 Rheumatic tricuspid valve disease, unspecified: Secondary | ICD-10-CM | POA: Insufficient documentation

## 2010-11-16 DIAGNOSIS — R0989 Other specified symptoms and signs involving the circulatory and respiratory systems: Secondary | ICD-10-CM

## 2010-11-16 DIAGNOSIS — I749 Embolism and thrombosis of unspecified artery: Secondary | ICD-10-CM

## 2010-11-16 DIAGNOSIS — I5022 Chronic systolic (congestive) heart failure: Secondary | ICD-10-CM

## 2010-11-16 DIAGNOSIS — I509 Heart failure, unspecified: Secondary | ICD-10-CM

## 2010-11-16 LAB — BASIC METABOLIC PANEL
Calcium: 9.9 mg/dL (ref 8.4–10.5)
Chloride: 101 mEq/L (ref 96–112)
Creat: 1.1 mg/dL (ref 0.40–1.50)

## 2010-11-16 LAB — POCT INR: INR: 3.5

## 2010-11-28 ENCOUNTER — Telehealth: Payer: Self-pay | Admitting: Internal Medicine

## 2010-11-28 MED ORDER — WARFARIN SODIUM 5 MG PO TABS: ORAL_TABLET | ORAL | Status: DC

## 2010-11-28 NOTE — Telephone Encounter (Signed)
Needs refill on coumadin 5 mg called to [facility]  [address]  [phone]

## 2010-12-07 ENCOUNTER — Encounter: Payer: Medicare Other | Admitting: *Deleted

## 2010-12-17 ENCOUNTER — Encounter: Payer: Self-pay | Admitting: Internal Medicine

## 2010-12-17 ENCOUNTER — Ambulatory Visit (INDEPENDENT_AMBULATORY_CARE_PROVIDER_SITE_OTHER): Payer: Medicare Other | Admitting: Internal Medicine

## 2010-12-17 VITALS — BP 98/68 | HR 74 | Ht 65.0 in | Wt 154.8 lb

## 2010-12-17 DIAGNOSIS — I5022 Chronic systolic (congestive) heart failure: Secondary | ICD-10-CM

## 2010-12-17 DIAGNOSIS — R0602 Shortness of breath: Secondary | ICD-10-CM

## 2010-12-17 LAB — BASIC METABOLIC PANEL
BUN: 32 mg/dL — ABNORMAL HIGH (ref 6–23)
Chloride: 99 mEq/L (ref 96–112)
Potassium: 3.9 mEq/L (ref 3.5–5.1)
Sodium: 138 mEq/L (ref 135–145)

## 2010-12-17 LAB — BRAIN NATRIURETIC PEPTIDE: Pro B Natriuretic peptide (BNP): 150 pg/mL — ABNORMAL HIGH (ref 0.0–100.0)

## 2010-12-17 MED ORDER — LISINOPRIL 10 MG PO TABS: 10.0000 mg | ORAL_TABLET | Freq: Two times a day (BID) | ORAL | Status: DC

## 2010-12-17 MED ORDER — METOLAZONE 2.5 MG PO TABS: 2.5000 mg | ORAL_TABLET | Freq: Every day | ORAL | Status: DC

## 2010-12-17 NOTE — Progress Notes (Signed)
HPI:  [name] is 50 y/o with severe CHF due to NICM EF 10-15%, severe MR previously on home milrinone. Was previously on transplant list but was taken off as he didn't respond to a page when a heart became available. Also has h/o mild CRI, NSVT s/p ICD and LV thrombus.    Had CPX 9/11: pVO2 23.3  Ve/VCo2 29  RER 1.22 O2 pulse 64%  Repeat echo 11/16/10: EF 10% moderate MR  Doing well. Reports compliance with all meds. No orthopnea, PND or edema. No ICD firings.  Weight up several pounds - says lasix isn't working as well as it used to. Class III dyspnea. No scale at home.   Stopped smoking last month. Using electronic cigarette.    ROS: All systems negative except as listed in HPI, PMH and Problem List.  Past Medical History  Diagnosis Date  . CHF (congestive heart failure)     EF- 10-15  . Medically noncompliant   . Mitral regurgitation   . Tobacco user   . HTN (hypertension)   . Depression   . AICD (automatic cardioverter/defibrillator) present   . GERD (gastroesophageal reflux disease)   . Substance abuse   . Migraines   . Chronic renal insufficiency     Current Outpatient Prescriptions  Medication Sig Dispense Refill  . carvedilol (COREG) 12.5 MG tablet Take 12.5 mg by mouth 2 (two) times daily with a meal.        . digoxin (LANOXIN) 0.125 MG tablet Take 125 mcg by mouth daily.        . hydrALAZINE (APRESOLINE) 50 MG tablet Take 75 mg by mouth 3 (three) times daily.        . isosorbide mononitrate (IMDUR) 30 MG 24 hr tablet Take 30 mg by mouth 2 (two) times daily.       Marland Kitchen lisinopril (PRINIVIL,ZESTRIL) 10 MG tablet Take 10 mg by mouth daily.        Marland Kitchen spironolactone (ALDACTONE) 25 MG tablet Take 25 mg by mouth daily.        Marland Kitchen torsemide (DEMADEX) 20 MG tablet Take 20 mg by mouth daily.        Marland Kitchen warfarin (COUMADIN) 5 MG tablet Take as directed by Anticoagulation clinic  35 tablet  0  . DISCONTD: allopurinol (ZYLOPRIM) 100 MG tablet Take 100 mg by mouth as needed.            PHYSICAL EXAM: Filed Vitals:   12/17/10 1133  BP: 98/68  Pulse: 74   Vitals - 1 value per visit 01/24/2010 02/28/2010 04/25/2010 07/03/2010 08/20/2010  Weight (lb) 140 147 143 147 150.5  HEIGHT 5\' 6"  5\' 6"  5\' 6"  5\' 6"  5\' 6"    Vitals - 1 value per visit 12/17/2010  Weight (lb) 154.8  HEIGHT 5\' 5"     General:  Well appearing. No resp difficulty HEENT: normal Neck: supple. JVP hard to asses. Carotids 2+ bilaterally; no bruits. No lymphadenopathy or thryomegaly appreciated. Cor: PMI normal. Regular rate & rhythm. No rubs, gallops or murmurs. Lungs: clear Abdomen: soft, nontender, mildly distended. No hepatosplenomegaly. No bruits or masses. Good bowel sounds. Extremities: no cyanosis, clubbing, rash, tr edema Neuro: alert & orientedx3, cranial nerves grossly intact. Moves all 4 extremities w/o difficulty. Affect pleasant.    ECG: NSR 74 LVH with repol    ASSESSMENT & PLAN:

## 2010-12-17 NOTE — Assessment & Plan Note (Signed)
Doing OK. NYHA II-III DOE. Appears mildly volume overloaded but hard to see neck veins. Will increase lisinopril to 10 bid. Add metolazone for 2 days. Repeat labs next week including cotinine. Previously taken off transplant list due to non-compliance.

## 2010-12-17 NOTE — Patient Instructions (Signed)
Increase Lisinopril to 10 mg Twice daily  Take Metolazone 2.5 mg for 2 days  Labs today and in 1 week (bmet, bnp)  Your physician recommends that you schedule a follow-up appointment in: 3-4 weeks

## 2010-12-25 ENCOUNTER — Other Ambulatory Visit: Payer: Medicare Other | Admitting: *Deleted

## 2011-01-10 ENCOUNTER — Other Ambulatory Visit (INDEPENDENT_AMBULATORY_CARE_PROVIDER_SITE_OTHER): Payer: Medicare Other | Admitting: *Deleted

## 2011-01-10 ENCOUNTER — Ambulatory Visit (INDEPENDENT_AMBULATORY_CARE_PROVIDER_SITE_OTHER): Payer: Medicare Other | Admitting: *Deleted

## 2011-01-10 DIAGNOSIS — I749 Embolism and thrombosis of unspecified artery: Secondary | ICD-10-CM

## 2011-01-10 DIAGNOSIS — R0602 Shortness of breath: Secondary | ICD-10-CM

## 2011-01-10 DIAGNOSIS — I5022 Chronic systolic (congestive) heart failure: Secondary | ICD-10-CM

## 2011-01-10 DIAGNOSIS — I8290 Acute embolism and thrombosis of unspecified vein: Secondary | ICD-10-CM

## 2011-01-10 LAB — BASIC METABOLIC PANEL
Chloride: 109 mEq/L (ref 96–112)
GFR: 67.92 mL/min (ref >60.00)
Potassium: 4 mEq/L (ref 3.5–5.1)
Sodium: 146 mEq/L — ABNORMAL HIGH (ref 135–145)

## 2011-01-10 LAB — POCT INR: INR: 1.1

## 2011-01-10 LAB — BRAIN NATRIURETIC PEPTIDE: Pro B Natriuretic peptide (BNP): 745 pg/mL — ABNORMAL HIGH (ref 0.0–100.0)

## 2011-01-10 MED ORDER — WARFARIN SODIUM 5 MG PO TABS: ORAL_TABLET | ORAL | Status: DC

## 2011-01-22 ENCOUNTER — Ambulatory Visit: Payer: Medicare Other | Admitting: Internal Medicine

## 2011-01-24 ENCOUNTER — Encounter: Payer: Medicare Other | Admitting: *Deleted

## 2011-01-28 ENCOUNTER — Encounter: Payer: Self-pay | Admitting: Internal Medicine

## 2011-01-28 ENCOUNTER — Ambulatory Visit (HOSPITAL_COMMUNITY)
Admission: RE | Admit: 2011-01-28 | Discharge: 2011-01-28 | Disposition: A | Discharge status: Home / Self Care | Payer: Medicare Other | Source: Ambulatory Visit | Attending: Internal Medicine | Admitting: Internal Medicine

## 2011-01-28 DIAGNOSIS — I5022 Chronic systolic (congestive) heart failure: Secondary | ICD-10-CM | POA: Insufficient documentation

## 2011-01-28 DIAGNOSIS — R0602 Shortness of breath: Secondary | ICD-10-CM | POA: Insufficient documentation

## 2011-01-28 DIAGNOSIS — I8290 Acute embolism and thrombosis of unspecified vein: Secondary | ICD-10-CM

## 2011-01-28 DIAGNOSIS — I749 Embolism and thrombosis of unspecified artery: Secondary | ICD-10-CM | POA: Insufficient documentation

## 2011-01-28 DIAGNOSIS — I513 Intracardiac thrombosis, not elsewhere classified: Secondary | ICD-10-CM

## 2011-01-28 HISTORY — DX: Intracardiac thrombosis, not elsewhere classified: I51.3

## 2011-01-28 LAB — COMPREHENSIVE METABOLIC PANEL
ALT: 28 U/L (ref 0–53)
Albumin: 4.1 g/dL (ref 3.5–5.2)
Alkaline Phosphatase: 75 U/L (ref 39–117)
BUN: 27 mg/dL — ABNORMAL HIGH (ref 6–23)
Chloride: 100 mEq/L (ref 96–112)
GFR calc Af Amer: >60 mL/min (ref >60)
Glucose, Bld: 99 mg/dL (ref 70–99)
Potassium: 4.7 mEq/L (ref 3.5–5.1)
Sodium: 135 mEq/L (ref 135–145)
Total Bilirubin: 0.7 mg/dL (ref 0.3–1.2)

## 2011-01-28 LAB — CBC
HCT: 40.5 % (ref 39.0–52.0)
Hemoglobin: 14.5 g/dL (ref 13.0–17.0)
RBC: 4.61 MIL/uL (ref 4.22–5.81)
WBC: 3.4 10*3/uL — ABNORMAL LOW (ref 4.0–10.5)

## 2011-01-28 LAB — PROTIME-INR: Prothrombin Time: 21.9 seconds — ABNORMAL HIGH (ref 11.6–15.2)

## 2011-01-28 NOTE — Assessment & Plan Note (Signed)
Doing pretty well. NYHA II. Volume status a bit hard to tell but appears ok. Will check labs today. Reinforced need for daily weights. Increase carvedilol to 18.75 at night and then also increase am dose as tolerated. May need to consider LVAD at some point.

## 2011-01-28 NOTE — Progress Notes (Deleted)
HPI:  [name] is 50 y/o with severe CHF due to NICM EF 10-15%, severe MR previously on home milrinone. Was previously on transplant list but was taken off as he didn't respond to a page when a heart became available. Also has h/o mild CRI, NSVT s/p ICD and LV thrombus.    Had CPX 9/11: pVO2 23.3  Ve/VCo2 29  RER 1.22 O2 pulse 64%  Repeat echo 11/16/10: EF 10% moderate MR  Doing well. Reports compliance with all meds. No orthopnea, PND or edema. No ICD firings.  Denies SOB unless he goes at a fast pace. Weighing a few times per week. Weight stable. Compliant with meds.   Stopped smoking last month. Using electronic cigarette.    ROS: All systems negative except as listed in HPI, PMH and Problem List.  Past Medical History  Diagnosis Date  . CHF (congestive heart failure)     EF- 10-15  . Medically noncompliant   . Mitral regurgitation   . Tobacco user   . HTN (hypertension)   . Depression   . AICD (automatic cardioverter/defibrillator) present   . GERD (gastroesophageal reflux disease)   . Substance abuse   . Migraines   . Chronic renal insufficiency     Current Outpatient Prescriptions  Medication Sig Dispense Refill  . carvedilol (COREG) 12.5 MG tablet Take 12.5 mg by mouth 2 (two) times daily with a meal.        . digoxin (LANOXIN) 0.125 MG tablet Take 125 mcg by mouth daily.        . hydrALAZINE (APRESOLINE) 50 MG tablet Take 75 mg by mouth 3 (three) times daily.        . isosorbide mononitrate (IMDUR) 30 MG 24 hr tablet Take 30 mg by mouth 2 (two) times daily.       Marland Kitchen lisinopril (PRINIVIL,ZESTRIL) 10 MG tablet Take 1 tablet (10 mg total) by mouth 2 (two) times daily.  60 tablet  6  . metolazone (ZAROXOLYN) 2.5 MG tablet Take 1 tablet (2.5 mg total) by mouth daily.  30 tablet  11  . spironolactone (ALDACTONE) 25 MG tablet Take 25 mg by mouth daily.        Marland Kitchen torsemide (DEMADEX) 20 MG tablet Take 20 mg by mouth 2 (two) times daily.       Marland Kitchen warfarin (COUMADIN) 5 MG tablet Take  as directed by Anticoagulation clinic  35 tablet  0     PHYSICAL EXAM: There were no vitals filed for this visit. Vitals - 1 value per visit 01/24/2010 02/28/2010 04/25/2010 07/03/2010 08/20/2010  Weight (lb) 140 147 143 147 150.5  HEIGHT 5\' 6"  5\' 6"  5\' 6"  5\' 6"  5\' 6"    Vitals - 1 value per visit 12/17/2010  Weight (lb) 154.8  HEIGHT 5\' 5"     General:  Well appearing. No resp difficulty HEENT: normal Neck: supple. JVP hard to asses. Carotids 2+ bilaterally; no bruits. No lymphadenopathy or thryomegaly appreciated. Cor: PMI normal. Regular rate & rhythm. No rubs, gallops or murmurs. Lungs: clear Abdomen: soft, nontender, mildly distended. No hepatosplenomegaly. No bruits or masses. Good bowel sounds. Extremities: no cyanosis, clubbing, rash, tr edema Neuro: alert & orientedx3, cranial nerves grossly intact. Moves all 4 extremities w/o difficulty. Affect pleasant.    ECG: NSR 74 LVH with repol    ASSESSMENT & PLAN:

## 2011-01-28 NOTE — Assessment & Plan Note (Signed)
Continue coumadin.  

## 2011-01-28 NOTE — Progress Notes (Unsigned)
  HPI:  [name] is 50 y/o with severe CHF due to NICM EF 10-15%, severe MR previously on home milrinone. Was previously on transplant list but was taken off as he didn't respond to a page when a heart became available. Also has h/o mild CRI, NSVT s/p ICD [facility]) and LV thrombus.   Had CPX 9/11: pVO2 23.3 Ve/VCo2 29 RER 1.22 O2 pulse 64%  Repeat echo 11/16/10: EF 10% moderate MR   Recent labs Cr 1.4 BNP 745  Doing well. Only gets DOE if he walks very fast. Reports compliance with all meds. No orthopnea, PND or edema. No ICD firings. Not weighing every day b/c scale broke.   Still not smoking. Using electronic cigarette.     ROS: All systems negative except as listed in HPI, PMH and Problem List.  Past Medical History  Diagnosis Date  . CHF (congestive heart failure)     EF- 10-15  . Medically noncompliant   . Mitral regurgitation   . Tobacco user   . HTN (hypertension)   . Depression   . AICD (automatic cardioverter/defibrillator) present   . GERD (gastroesophageal reflux disease)   . Substance abuse   . Migraines   . Chronic renal insufficiency     Current Outpatient Prescriptions  Medication Sig Dispense Refill  . carvedilol (COREG) 12.5 MG tablet Take 12.5 mg by mouth 2 (two) times daily with a meal.        . digoxin (LANOXIN) 0.125 MG tablet Take 125 mcg by mouth daily.        . hydrALAZINE (APRESOLINE) 50 MG tablet Take 75 mg by mouth 3 (three) times daily.        . isosorbide mononitrate (IMDUR) 30 MG 24 hr tablet Take 30 mg by mouth 2 (two) times daily.       Marland Kitchen lisinopril (PRINIVIL,ZESTRIL) 10 MG tablet Take 1 tablet (10 mg total) by mouth 2 (two) times daily.  60 tablet  6  . metolazone (ZAROXOLYN) 2.5 MG tablet Take 1 tablet (2.5 mg total) by mouth daily.  30 tablet  11  . spironolactone (ALDACTONE) 25 MG tablet Take 25 mg by mouth daily.        Marland Kitchen torsemide (DEMADEX) 20 MG tablet Take 20 mg by mouth 2 (two) times daily.       Marland Kitchen warfarin (COUMADIN) 5 MG tablet Take  as directed by Anticoagulation clinic  35 tablet  0     PHYSICAL EXAM: Filed Vitals:   01/28/11 1211  BP: 104/68  Pulse: 66  Wt 156 General:  Well appearing. No resp difficulty HEENT: normal Neck: supple. JVP flat. Carotids 2+ bilaterally; no bruits. No lymphadenopathy or thryomegaly appreciated. Cor: PMI normal. Regular rate & rhythm. No rubs, gallops or murmurs. Lungs: clear Abdomen: soft, nontender, nondistended. No hepatosplenomegaly. No bruits or masses. Good bowel sounds. Extremities: no cyanosis, clubbing, rash, edema Neuro: alert & orientedx3, cranial nerves grossly intact. Moves all 4 extremities w/o difficulty. Affect pleasant.    ASSESSMENT & PLAN:

## 2011-01-28 NOTE — Patient Instructions (Signed)
Labs today  Device Check on Mon 8/13 at 2 pm at [facility]  Your physician recommends that you schedule a follow-up appointment in: 1 month.

## 2011-01-29 ENCOUNTER — Ambulatory Visit (INDEPENDENT_AMBULATORY_CARE_PROVIDER_SITE_OTHER): Payer: Self-pay | Admitting: Cardiology

## 2011-01-29 DIAGNOSIS — R0989 Other specified symptoms and signs involving the circulatory and respiratory systems: Secondary | ICD-10-CM

## 2011-01-30 ENCOUNTER — Other Ambulatory Visit: Payer: Self-pay | Admitting: *Deleted

## 2011-01-30 MED ORDER — WARFARIN SODIUM 5 MG PO TABS: ORAL_TABLET | ORAL | Status: DC

## 2011-02-04 ENCOUNTER — Encounter: Payer: Medicare Other | Admitting: *Deleted

## 2011-02-05 ENCOUNTER — Encounter: Payer: Medicare Other | Admitting: *Deleted

## 2011-02-11 ENCOUNTER — Ambulatory Visit (HOSPITAL_COMMUNITY): Payer: Medicare Other

## 2011-02-14 ENCOUNTER — Other Ambulatory Visit: Payer: Self-pay | Admitting: Internal Medicine

## 2011-02-14 ENCOUNTER — Ambulatory Visit (HOSPITAL_COMMUNITY): Payer: Medicare Other

## 2011-02-14 NOTE — Telephone Encounter (Signed)
Refill request

## 2011-02-20 ENCOUNTER — Encounter: Payer: Medicare Other | Admitting: *Deleted

## 2011-02-28 ENCOUNTER — Ambulatory Visit (HOSPITAL_COMMUNITY)
Admission: RE | Admit: 2011-02-28 | Discharge: 2011-02-28 | Disposition: A | Discharge status: Home / Self Care | Payer: Medicare Other | Source: Ambulatory Visit | Attending: Internal Medicine | Admitting: Internal Medicine

## 2011-02-28 VITALS — BP 94/64 | HR 70 | Wt 154.2 lb

## 2011-02-28 DIAGNOSIS — I5022 Chronic systolic (congestive) heart failure: Secondary | ICD-10-CM | POA: Insufficient documentation

## 2011-02-28 DIAGNOSIS — R0602 Shortness of breath: Secondary | ICD-10-CM | POA: Insufficient documentation

## 2011-02-28 NOTE — Patient Instructions (Signed)
Labs next week, Thur. 9/13 at 2:30  Your physician recommends that you schedule a follow-up appointment in: 1 month

## 2011-02-28 NOTE — Progress Notes (Signed)
HPI:  [name] is 50 y/o with severe CHF due to NICM EF 10-15%, severe MR previously on home milrinone. Was previously on transplant list but was taken off as he didn't respond to a page when a heart became available. Also has h/o mild CRI, NSVT s/p ICD [facility]) and LV thrombus.   Had CPX 9/11: pVO2 23.3 Ve/VCo2 29 RER 1.22 O2 pulse 64%  Repeat echo 11/16/10: EF 10% moderate MR   Recent labs Cr 1.4 BNP 745  We saw him last month and carvedilol increased to 18.75 in am with plans to increase am dose as tolerated. However he didn't increase the dose yet. BNP was also up (2588) and we gave him several extra doses of demadex. Taking all other medicines regularly. If not too hot can walk "pretty good" but struggles with hills and steps. Can't walk up driveway without stopping. No edema. PND. Intermittent nausea and diarrhea feels it is related to things he smells. No ab bloating.   Not feeling very well today. Has migraine HA. Bought a scale. Weight down to 150 at home and stable.   No longer using electronic cigarette (made him sick). Now using patches. Not smoking except he had a few on his birthday.    ROS: All systems negative except as listed in HPI, PMH and Problem List.  Past Medical History  Diagnosis Date  . CHF (congestive heart failure)     EF- 10-15  . Medically noncompliant   . Mitral regurgitation   . Tobacco user   . HTN (hypertension)   . Depression   . AICD (automatic cardioverter/defibrillator) present   . GERD (gastroesophageal reflux disease)   . Substance abuse   . Migraines   . Chronic renal insufficiency     Current Outpatient Prescriptions  Medication Sig Dispense Refill  . carvedilol (COREG) 12.5 MG tablet Take 12.5 mg by mouth 2 (two) times daily with a meal.        . digoxin (LANOXIN) 0.125 MG tablet Take 125 mcg by mouth daily.        . hydrALAZINE (APRESOLINE) 50 MG tablet Take 75 mg by mouth 3 (three) times daily.        . isosorbide mononitrate  (IMDUR) 30 MG 24 hr tablet Take 30 mg by mouth 2 (two) times daily.       Marland Kitchen lisinopril (PRINIVIL,ZESTRIL) 10 MG tablet Take 1 tablet (10 mg total) by mouth 2 (two) times daily.  60 tablet  6  . metolazone (ZAROXOLYN) 2.5 MG tablet Take 2.5 mg by mouth as needed.        Marland Kitchen spironolactone (ALDACTONE) 25 MG tablet Take 25 mg by mouth daily.        Marland Kitchen torsemide (DEMADEX) 20 MG tablet Take 20 mg by mouth 2 (two) times daily.       Marland Kitchen warfarin (COUMADIN) 5 MG tablet Take as directed by Anticoagulation clinic  35 tablet  0  . warfarin (COUMADIN) 5 MG tablet TAKE AS DIRECTED BY ANTICOAGULATION CLINIC  35 tablet  0     PHYSICAL EXAM: Filed Vitals:   02/28/11 1212  BP: 94/64  Pulse: 70   Vitals - 1 value per visit 02/28/2011 01/28/2011 01/28/2011 12/17/2010 08/20/2010  Weight (lb) 154.25 156 156 154.8 150.5  HEIGHT   5\' 5"  5\' 5"  5\' 6"     General:  Fatigued appearing. No resp difficulty HEENT: normal Neck: supple. JVP flat. Carotids 2+ bilaterally; no bruits. No lymphadenopathy or thryomegaly appreciated. Cor: PMI  laterally displaced Regular rate & rhythm. + s3. Soft MR  Lungs: clear Abdomen: soft, nontender, nondistended. No hepatosplenomegaly. No bruits or masses. Good bowel sounds. Extremities: no cyanosis, clubbing, rash, edema Neuro: alert & orientedx3, cranial nerves grossly intact. Moves all 4 extremities w/o difficulty. Affect pleasant.    ASSESSMENT & PLAN:

## 2011-02-28 NOTE — Assessment & Plan Note (Signed)
Overall NYHA III. Volume status looks good. Unclear if n/v related to HF but I suspect not. Will keep meds at current level as he is not feeling well today due to migraine. Check labs next week. RTC in 1 month.

## 2011-03-15 LAB — CBC
HCT: 48.5
Hemoglobin: 16.1
MCHC: 33.2
MCV: 94.2
Platelets: 224
RBC: 5.15
RDW: 14.8
WBC: 6.1

## 2011-03-15 LAB — DIFFERENTIAL
Basophils Absolute: 0
Basophils Relative: 1
Eosinophils Absolute: 0.1
Eosinophils Relative: 1
Lymphocytes Relative: 24
Lymphs Abs: 1.5
Monocytes Absolute: 0.5
Monocytes Relative: 9
Neutro Abs: 4
Neutrophils Relative %: 65

## 2011-03-15 LAB — I-STAT 8, (EC8 V) (CONVERTED LAB)
Acid-base deficit: 3 — ABNORMAL HIGH
BUN: 10
Bicarbonate: 24.5 — ABNORMAL HIGH
Chloride: 113 — ABNORMAL HIGH
Glucose, Bld: 113 — ABNORMAL HIGH
HCT: 51
Hemoglobin: 17.3 — ABNORMAL HIGH
Operator id: 284251
Potassium: 4.8
Sodium: 145
TCO2: 26
pCO2, Ven: 51.3 — ABNORMAL HIGH
pH, Ven: 7.287

## 2011-03-15 LAB — COMPREHENSIVE METABOLIC PANEL
ALT: 38
AST: 38 — ABNORMAL HIGH
Albumin: 3.5
Alkaline Phosphatase: 81
Chloride: 107
GFR calc Af Amer: >60
Potassium: 4.1
Sodium: 143
Total Bilirubin: 2.3 — ABNORMAL HIGH
Total Protein: 6.5

## 2011-03-15 LAB — CK TOTAL AND CKMB (NOT AT ARMC)
CK, MB: 1.5
CK, MB: 1.6
Relative Index: 0.7
Relative Index: 0.8
Total CK: 168
Total CK: 206

## 2011-03-15 LAB — POCT CARDIAC MARKERS
CKMB, poc: <1 — ABNORMAL LOW
Myoglobin, poc: 36.8
Operator id: 284251
Troponin i, poc: <0.05

## 2011-03-15 LAB — LIPID PANEL
LDL Cholesterol: 74
Total CHOL/HDL Ratio: 2.7
VLDL: 11

## 2011-03-15 LAB — BASIC METABOLIC PANEL
BUN: 11
BUN: 13
Calcium: 8.2 — ABNORMAL LOW
Chloride: 99
GFR calc non Af Amer: 60 — ABNORMAL LOW
Glucose, Bld: 84
Glucose, Bld: 90
Potassium: 4.5
Sodium: 141

## 2011-03-15 LAB — RAPID URINE DRUG SCREEN, HOSP PERFORMED
Amphetamines: NOT DETECTED
Barbiturates: NOT DETECTED
Benzodiazepines: NOT DETECTED
Opiates: NOT DETECTED
Tetrahydrocannabinol: NOT DETECTED

## 2011-03-15 LAB — POCT I-STAT CREATININE
Creatinine, Ser: 1.4
Operator id: 284251

## 2011-03-15 LAB — PROTIME-INR
Prothrombin Time: 21.4 — ABNORMAL HIGH
Prothrombin Time: 22 — ABNORMAL HIGH

## 2011-03-15 LAB — TSH: TSH: 3.208

## 2011-03-15 LAB — DIGOXIN LEVEL: Digoxin Level: <0.2 — ABNORMAL LOW

## 2011-03-15 LAB — B-NATRIURETIC PEPTIDE (CONVERTED LAB): Pro B Natriuretic peptide (BNP): 2297 — ABNORMAL HIGH

## 2011-03-15 LAB — TROPONIN I: Troponin I: 0.04

## 2011-03-25 LAB — DIFFERENTIAL
Basophils Absolute: 0
Basophils Absolute: 0
Basophils Relative: 0
Basophils Relative: 1
Eosinophils Absolute: 0.1
Eosinophils Absolute: 0.1
Eosinophils Relative: 2
Monocytes Relative: 8
Neutro Abs: 3.8
Neutrophils Relative %: 58
Neutrophils Relative %: 63

## 2011-03-25 LAB — PROTIME-INR
INR: 1.2
INR: 1.2
Prothrombin Time: 15.4 — ABNORMAL HIGH

## 2011-03-25 LAB — MAGNESIUM
Magnesium: 1.9
Magnesium: 2.3

## 2011-03-25 LAB — CULTURE, BLOOD (ROUTINE X 2): Culture: NO GROWTH

## 2011-03-25 LAB — BASIC METABOLIC PANEL
BUN: 17
BUN: 9
CO2: 30
CO2: 32
Chloride: 99
Chloride: 99
Creatinine, Ser: 1.31
Creatinine, Ser: 1.48
Creatinine, Ser: 1.56 — ABNORMAL HIGH
GFR calc Af Amer: 59 — ABNORMAL LOW
GFR calc Af Amer: >60
GFR calc Af Amer: >60
GFR calc non Af Amer: 51 — ABNORMAL LOW
Glucose, Bld: 102 — ABNORMAL HIGH
Glucose, Bld: 107 — ABNORMAL HIGH

## 2011-03-25 LAB — CBC
HCT: 43.4
HCT: 45.9
Hemoglobin: 14.7
MCHC: 33.4
MCHC: 33.7
MCV: 96.4
MCV: 96.8
Platelets: 276
Platelets: 285
RBC: 4.57
RBC: 5.16
RDW: 15.6 — ABNORMAL HIGH
RDW: 15.8 — ABNORMAL HIGH
RDW: 15.9 — ABNORMAL HIGH
WBC: 6.1

## 2011-03-25 LAB — POCT I-STAT, CHEM 8
Calcium, Ion: 1.04 — ABNORMAL LOW
Creatinine, Ser: 1.4
Glucose, Bld: 96
HCT: 44
Hemoglobin: 15
Potassium: 3.5

## 2011-03-25 LAB — CARDIAC PANEL(CRET KIN+CKTOT+MB+TROPI)
Total CK: 127
Troponin I: 0.04

## 2011-03-25 LAB — POCT CARDIAC MARKERS: Troponin i, poc: <0.05

## 2011-03-25 LAB — B-NATRIURETIC PEPTIDE (CONVERTED LAB): Pro B Natriuretic peptide (BNP): 2201 — ABNORMAL HIGH

## 2011-03-25 LAB — HIV ANTIBODY (ROUTINE TESTING W REFLEX): HIV: NONREACTIVE

## 2011-03-26 ENCOUNTER — Ambulatory Visit: Payer: Medicare Other | Admitting: Internal Medicine

## 2011-03-26 LAB — POCT CARDIAC MARKERS
CKMB, poc: <1 — ABNORMAL LOW
CKMB, poc: <1 — ABNORMAL LOW
Myoglobin, poc: 43.6
Troponin i, poc: <0.05
Troponin i, poc: <0.05

## 2011-03-26 LAB — POCT I-STAT, CHEM 8
BUN: 13
Calcium, Ion: 1.14
Chloride: 113 — ABNORMAL HIGH
Creatinine, Ser: 1.4
Glucose, Bld: 108 — ABNORMAL HIGH
HCT: 49
Hemoglobin: 16.7
Potassium: 5.3 — ABNORMAL HIGH
Sodium: 142
TCO2: 21

## 2011-03-26 LAB — B-NATRIURETIC PEPTIDE (CONVERTED LAB): Pro B Natriuretic peptide (BNP): 97

## 2011-03-26 LAB — D-DIMER, QUANTITATIVE: D-Dimer, Quant: 0.32

## 2011-03-28 LAB — COMPREHENSIVE METABOLIC PANEL
ALT: 29 U/L (ref 0–53)
AST: 35 U/L (ref 0–37)
Albumin: 3.1 g/dL — ABNORMAL LOW (ref 3.5–5.2)
Albumin: 3.3 g/dL — ABNORMAL LOW (ref 3.5–5.2)
Alkaline Phosphatase: 67 U/L (ref 39–117)
BUN: 16 mg/dL (ref 6–23)
Chloride: 102 mEq/L (ref 96–112)
Chloride: 102 mEq/L (ref 96–112)
Creatinine, Ser: 1.39 mg/dL (ref 0.4–1.5)
GFR calc Af Amer: 59 mL/min — ABNORMAL LOW (ref >60)
Potassium: 4.5 mEq/L (ref 3.5–5.1)
Total Bilirubin: 1.2 mg/dL (ref 0.3–1.2)
Total Bilirubin: 1.2 mg/dL (ref 0.3–1.2)

## 2011-03-28 LAB — CARDIAC PANEL(CRET KIN+CKTOT+MB+TROPI)
CK, MB: 1.3 ng/mL (ref 0.3–4.0)
Relative Index: 0.8 (ref 0.0–2.5)
Relative Index: 1.1 (ref 0.0–2.5)
Troponin I: 0.02 ng/mL (ref 0.00–0.06)

## 2011-03-28 LAB — URINE MICROSCOPIC-ADD ON

## 2011-03-28 LAB — PROTIME-INR
INR: 3.2 — ABNORMAL HIGH (ref 0.00–1.49)
Prothrombin Time: 22.5 seconds — ABNORMAL HIGH (ref 11.6–15.2)
Prothrombin Time: 34.9 seconds — ABNORMAL HIGH (ref 11.6–15.2)

## 2011-03-28 LAB — DIFFERENTIAL
Basophils Absolute: 0 10*3/uL (ref 0.0–0.1)
Basophils Relative: 1 % (ref 0–1)
Monocytes Absolute: 0.4 10*3/uL (ref 0.1–1.0)
Neutro Abs: 3.2 10*3/uL (ref 1.7–7.7)
Neutrophils Relative %: 60 % (ref 43–77)

## 2011-03-28 LAB — CBC
HCT: 44.8 % (ref 39.0–52.0)
Hemoglobin: 16.7 g/dL (ref 13.0–17.0)
MCHC: 33 g/dL (ref 30.0–36.0)
MCV: 94.7 fL (ref 78.0–100.0)
Platelets: 210 10*3/uL (ref 150–400)
Platelets: 222 10*3/uL (ref 150–400)
RDW: 15.4 % (ref 11.5–15.5)
RDW: 15.7 % — ABNORMAL HIGH (ref 11.5–15.5)
WBC: 5.6 10*3/uL (ref 4.0–10.5)

## 2011-03-28 LAB — BASIC METABOLIC PANEL
Calcium: 8.6 mg/dL (ref 8.4–10.5)
GFR calc Af Amer: >60 mL/min (ref >60)
GFR calc non Af Amer: >60 mL/min (ref >60)
Glucose, Bld: 102 mg/dL — ABNORMAL HIGH (ref 70–99)
Potassium: 3.8 mEq/L (ref 3.5–5.1)
Sodium: 136 mEq/L (ref 135–145)

## 2011-03-28 LAB — LIPID PANEL
Cholesterol: 164 mg/dL (ref 0–200)
HDL: 52 mg/dL (ref >39)
LDL Cholesterol: 95 mg/dL (ref 0–99)
Total CHOL/HDL Ratio: 3.2 RATIO
Triglycerides: 86 mg/dL (ref <150)
VLDL: 17 mg/dL (ref 0–40)

## 2011-03-28 LAB — RAPID URINE DRUG SCREEN, HOSP PERFORMED
Barbiturates: NOT DETECTED
Benzodiazepines: NOT DETECTED
Cocaine: NOT DETECTED

## 2011-03-28 LAB — URINALYSIS, ROUTINE W REFLEX MICROSCOPIC
Ketones, ur: 15 mg/dL — AB
Nitrite: NEGATIVE
pH: 5.5 (ref 5.0–8.0)

## 2011-03-28 LAB — CK TOTAL AND CKMB (NOT AT ARMC): Relative Index: 1 (ref 0.0–2.5)

## 2011-03-28 LAB — GLUCOSE, CAPILLARY
Glucose-Capillary: 134 mg/dL — ABNORMAL HIGH (ref 70–99)
Glucose-Capillary: 139 mg/dL — ABNORMAL HIGH (ref 70–99)
Glucose-Capillary: 98 mg/dL (ref 70–99)

## 2011-03-28 LAB — BILIRUBIN, DIRECT: Bilirubin, Direct: 0.2 mg/dL (ref 0.0–0.3)

## 2011-03-28 LAB — TSH: TSH: 3.184 u[IU]/mL (ref 0.350–4.500)

## 2011-04-01 ENCOUNTER — Ambulatory Visit (HOSPITAL_COMMUNITY): Payer: Medicare Other

## 2011-04-03 ENCOUNTER — Other Ambulatory Visit: Payer: Medicare Other | Admitting: *Deleted

## 2011-04-03 ENCOUNTER — Encounter: Payer: Medicare Other | Admitting: *Deleted

## 2011-04-11 LAB — BASIC METABOLIC PANEL
BUN: 11
BUN: 12
BUN: 17
BUN: 8
CO2: 27
CO2: 29
CO2: 30
CO2: 30
CO2: 31
Calcium: 8.4
Calcium: 8.5
Calcium: 8.5
Calcium: 8.6
Calcium: 8.7
Chloride: 100
Chloride: 102
Chloride: 95 — ABNORMAL LOW
Chloride: 98
Creatinine, Ser: 1.1
Creatinine, Ser: 1.14
Creatinine, Ser: 1.42
Creatinine, Ser: 1.56 — ABNORMAL HIGH
Creatinine, Ser: 1.59 — ABNORMAL HIGH
GFR calc Af Amer: 58 — ABNORMAL LOW
GFR calc Af Amer: 59 — ABNORMAL LOW
GFR calc Af Amer: >60
GFR calc Af Amer: >60
GFR calc Af Amer: >60
GFR calc Af Amer: >60
GFR calc non Af Amer: 48 — ABNORMAL LOW
GFR calc non Af Amer: 49 — ABNORMAL LOW
GFR calc non Af Amer: 59 — ABNORMAL LOW
GFR calc non Af Amer: >60
GFR calc non Af Amer: >60
GFR calc non Af Amer: >60
GFR calc non Af Amer: >60
Glucose, Bld: 83
Glucose, Bld: 84
Glucose, Bld: 86
Glucose, Bld: 91
Potassium: 3.9
Potassium: 3.9
Potassium: 4.1
Potassium: 4.5
Potassium: 4.9
Sodium: 134 — ABNORMAL LOW
Sodium: 135
Sodium: 136
Sodium: 138
Sodium: 138
Sodium: 138

## 2011-04-11 LAB — POCT I-STAT 3, VENOUS BLOOD GAS (G3P V)
Acid-base deficit: 1
Acid-base deficit: 1
Bicarbonate: 22.2
Bicarbonate: 23.6
Bicarbonate: 23.8
Bicarbonate: 25 — ABNORMAL HIGH
O2 Saturation: 44
O2 Saturation: 45
Operator id: 151231
Operator id: 151231
TCO2: 23
TCO2: 25
TCO2: 26
pCO2, Ven: 39.6 — ABNORMAL LOW
pCO2, Ven: 40 — ABNORMAL LOW
pCO2, Ven: 41.7 — ABNORMAL LOW
pH, Ven: 7.353 — ABNORMAL HIGH
pO2, Ven: 21 — CL
pO2, Ven: 25 — CL

## 2011-04-11 LAB — CBC
HCT: 44
HCT: 44.1
HCT: 44.2
HCT: 46.5
Hemoglobin: 14.6
Hemoglobin: 14.8
Hemoglobin: 14.8
Hemoglobin: 15.1
Hemoglobin: 15.5
MCHC: 32.8
MCHC: 33.1
MCHC: 33.5
MCHC: 33.6
MCV: 86.4
Platelets: 291
Platelets: 294
Platelets: 319
Platelets: 321
Platelets: 343
RBC: 5.1
RBC: 5.13
RBC: 5.23
RBC: 5.24
RBC: 5.38
RDW: 14.4 — ABNORMAL HIGH
RDW: 14.6 — ABNORMAL HIGH
RDW: 14.9 — ABNORMAL HIGH
RDW: 15 — ABNORMAL HIGH
RDW: 15 — ABNORMAL HIGH
RDW: 15.1 — ABNORMAL HIGH
WBC: 3.9 — ABNORMAL LOW
WBC: 4.2
WBC: 4.5

## 2011-04-11 LAB — POCT I-STAT 3, ART BLOOD GAS (G3+)
Bicarbonate: 19.4 — ABNORMAL LOW
TCO2: 20
pCO2 arterial: 25.8 — ABNORMAL LOW
pH, Arterial: 7.483 — ABNORMAL HIGH
pO2, Arterial: 92

## 2011-04-11 LAB — PROTIME-INR
INR: 1.3
INR: 1.4
INR: 1.5
INR: 1.5
INR: 1.7 — ABNORMAL HIGH
INR: 1.8 — ABNORMAL HIGH
INR: 2 — ABNORMAL HIGH
INR: 2.6 — ABNORMAL HIGH
Prothrombin Time: 18.1 — ABNORMAL HIGH
Prothrombin Time: 18.4 — ABNORMAL HIGH
Prothrombin Time: 21.1 — ABNORMAL HIGH
Prothrombin Time: 23.4 — ABNORMAL HIGH
Prothrombin Time: 27.9 — ABNORMAL HIGH

## 2011-04-11 LAB — B-NATRIURETIC PEPTIDE (CONVERTED LAB)
Pro B Natriuretic peptide (BNP): 1025 — ABNORMAL HIGH
Pro B Natriuretic peptide (BNP): 1233 — ABNORMAL HIGH
Pro B Natriuretic peptide (BNP): 1864 — ABNORMAL HIGH
Pro B Natriuretic peptide (BNP): 51

## 2011-04-11 LAB — MAGNESIUM: Magnesium: 2.3

## 2011-04-11 LAB — HEPARIN LEVEL (UNFRACTIONATED)
Heparin Unfractionated: 0.21 — ABNORMAL LOW
Heparin Unfractionated: <0.1 — ABNORMAL LOW

## 2011-04-18 ENCOUNTER — Ambulatory Visit (HOSPITAL_COMMUNITY): Payer: Medicare Other | Attending: Internal Medicine

## 2011-04-25 ENCOUNTER — Ambulatory Visit (INDEPENDENT_AMBULATORY_CARE_PROVIDER_SITE_OTHER): Payer: Medicare Other | Admitting: *Deleted

## 2011-04-25 DIAGNOSIS — I749 Embolism and thrombosis of unspecified artery: Secondary | ICD-10-CM

## 2011-04-25 DIAGNOSIS — I8290 Acute embolism and thrombosis of unspecified vein: Secondary | ICD-10-CM

## 2011-04-25 LAB — POCT INR: INR: 1.2

## 2011-04-25 MED ORDER — WARFARIN SODIUM 5 MG PO TABS: ORAL_TABLET | ORAL | Status: DC

## 2011-05-02 ENCOUNTER — Encounter: Payer: Medicare Other | Admitting: Internal Medicine

## 2011-05-02 ENCOUNTER — Encounter: Payer: Medicare Other | Admitting: *Deleted

## 2011-05-03 ENCOUNTER — Encounter: Payer: Medicare Other | Admitting: *Deleted

## 2011-05-07 ENCOUNTER — Encounter: Payer: Medicare Other | Admitting: *Deleted

## 2011-05-21 ENCOUNTER — Other Ambulatory Visit: Payer: Self-pay | Admitting: *Deleted

## 2011-05-21 ENCOUNTER — Ambulatory Visit (HOSPITAL_COMMUNITY)
Admission: RE | Admit: 2011-05-21 | Discharge: 2011-05-21 | Disposition: A | Discharge status: Home / Self Care | Payer: Medicare Other | Source: Ambulatory Visit | Attending: Internal Medicine | Admitting: Internal Medicine

## 2011-05-21 ENCOUNTER — Ambulatory Visit (INDEPENDENT_AMBULATORY_CARE_PROVIDER_SITE_OTHER): Payer: Self-pay | Admitting: Cardiology

## 2011-05-21 VITALS — BP 80/56 | HR 110 | Wt 153.8 lb

## 2011-05-21 DIAGNOSIS — R0989 Other specified symptoms and signs involving the circulatory and respiratory systems: Secondary | ICD-10-CM

## 2011-05-21 DIAGNOSIS — I8290 Acute embolism and thrombosis of unspecified vein: Secondary | ICD-10-CM

## 2011-05-21 DIAGNOSIS — I749 Embolism and thrombosis of unspecified artery: Secondary | ICD-10-CM

## 2011-05-21 DIAGNOSIS — I5022 Chronic systolic (congestive) heart failure: Secondary | ICD-10-CM | POA: Insufficient documentation

## 2011-05-21 LAB — BASIC METABOLIC PANEL
CO2: 28 mEq/L (ref 19–32)
Chloride: 103 mEq/L (ref 96–112)
GFR calc Af Amer: 74 mL/min — ABNORMAL LOW (ref >90)
Potassium: 4.8 mEq/L (ref 3.5–5.1)
Sodium: 142 mEq/L (ref 135–145)

## 2011-05-21 LAB — PROTIME-INR
INR: 1.47 (ref 0.00–1.49)
Prothrombin Time: 18.1 seconds — ABNORMAL HIGH (ref 11.6–15.2)

## 2011-05-21 MED ORDER — WARFARIN SODIUM 5 MG PO TABS: 5.0000 mg | ORAL_TABLET | Freq: Every day | ORAL | Status: DC

## 2011-05-21 NOTE — Patient Instructions (Signed)
Labs today  Your physician has recommended that you have a cardiopulmonary stress test (CPX). CPX testing is a non-invasive measurement of heart and lung function. It replaces a traditional treadmill stress test. This type of test provides a tremendous amount of information that relates not only to your present condition but also for future outcomes. This test combines measurements of you ventilation, respiratory gas exchange in the lungs, electrocardiogram (EKG), blood pressure and physical response before, during, and following an exercise protocol.  Your physician recommends that you schedule a follow-up appointment in: 6 weeks  

## 2011-05-21 NOTE — Progress Notes (Signed)
HPI:  [name] is 50 y/o with severe CHF due to NICM EF 10-15%, severe MR previously on home milrinone. Was previously on transplant list but was taken off as he didn't respond to a page when a heart became available. Also has h/o mild CRI, NSVT s/p ICD and LV thrombus.    Had CPX 9/11: pVO2 23.3  Ve/VCo2 29  RER 1.22 O2 pulse 64%  Repeat echo 11/16/10: EF 10% moderate MR  Doing fairly well. Says he has good days and bad days. On his good days feels he isn't sick at all on bad days lays in bed all day wiped out. Bad days happen about 1-2 days per month. Smoking 3 cigs/day.  Reports compliance with all meds. No orthopnea, PND or edema. No ICD firings.  Weight stable.  Weighing every day. Uses metolazone 1-2x/month.  BP low but no dizziness.    ROS: All systems negative except as listed in HPI, PMH and Problem List.  Past Medical History  Diagnosis Date  . CHF (congestive heart failure)     EF- 10-15  . Medically noncompliant   . Mitral regurgitation   . Tobacco user   . HTN (hypertension)   . Depression   . AICD (automatic cardioverter/defibrillator) present   . GERD (gastroesophageal reflux disease)   . Substance abuse   . Migraines   . Chronic renal insufficiency   . Syncope     Current Outpatient Prescriptions  Medication Sig Dispense Refill  . carvedilol (COREG) 12.5 MG tablet Take 12.5 mg by mouth 2 (two) times daily with a meal.        . digoxin (LANOXIN) 0.125 MG tablet Take 125 mcg by mouth daily.        . hydrALAZINE (APRESOLINE) 50 MG tablet Take 75 mg by mouth 3 (three) times daily.        . isosorbide mononitrate (IMDUR) 30 MG 24 hr tablet Take 30 mg by mouth 2 (two) times daily.       Marland Kitchen lisinopril (PRINIVIL,ZESTRIL) 10 MG tablet Take 1 tablet (10 mg total) by mouth 2 (two) times daily.  60 tablet  6  . metolazone (ZAROXOLYN) 2.5 MG tablet Take 2.5 mg by mouth as needed.        Marland Kitchen spironolactone (ALDACTONE) 25 MG tablet Take 25 mg by mouth daily.        Marland Kitchen torsemide  (DEMADEX) 20 MG tablet Take 20 mg by mouth 2 (two) times daily.       Marland Kitchen warfarin (COUMADIN) 5 MG tablet Take as directed by Anticoagulation clinic  35 tablet  0     PHYSICAL EXAM: Filed Vitals:   05/21/11 1001  BP: 80/56  Pulse: 110   Vitals - 1 value per visit 05/21/2011 02/28/2011 01/28/2011 01/28/2011 12/17/2010  SYSTOLIC 80 94 104 104 98  DIASTOLIC 56 64 68 68 68  PULSE 110 70 66 66 74  RESPIRATIONS       Weight (lb) 153.75 154.25 156 156 154.8  HEIGHT    5\' 5"  5\' 5"    Vitals - 1 value per visit 08/20/2010  SYSTOLIC 106  DIASTOLIC 72  PULSE 74  RESPIRATIONS   Weight (lb) 150.5  HEIGHT 5\' 6"     General:  Well appearing. No resp difficulty HEENT: normal Neck: supple. JVP hard to asses. Carotids 2+ bilaterally; no bruits. No lymphadenopathy or thryomegaly appreciated. Cor: PMI normal. Regular rate & rhythm. No rubs, gallops or murmurs. Lungs: clear Abdomen: soft, nontender, mildly distended. No hepatosplenomegaly.  No bruits or masses. Good bowel sounds. Extremities: no cyanosis, clubbing, rash, tr edema Neuro: alert & orientedx3, cranial nerves grossly intact. Moves all 4 extremities w/o difficulty. Affect pleasant.    ECG: NSR 74 LVH with repol    ASSESSMENT & PLAN:

## 2011-05-23 ENCOUNTER — Encounter: Payer: Medicare Other | Admitting: Internal Medicine

## 2011-05-24 NOTE — Assessment & Plan Note (Addendum)
Continues with NYHA III symptoms. Due for labs and repeat CPX testing. Suspect he will need advanced therapies at some point but not sure he will qualify given compliance issues in past. We discussed these issues at length. Volume status currently looks OK. Also discussed use of sliding scale diuretics. Reinforced need to quit smoking completely to be eligible for advanced therapies when needed.   Total time spent = 40 mins with over 50% of that time dedicated to counseling and discussions described above.

## 2011-06-04 ENCOUNTER — Ambulatory Visit (INDEPENDENT_AMBULATORY_CARE_PROVIDER_SITE_OTHER): Payer: Medicare Other | Admitting: *Deleted

## 2011-06-04 DIAGNOSIS — I749 Embolism and thrombosis of unspecified artery: Secondary | ICD-10-CM

## 2011-06-04 DIAGNOSIS — I8290 Acute embolism and thrombosis of unspecified vein: Secondary | ICD-10-CM

## 2011-06-12 ENCOUNTER — Ambulatory Visit (HOSPITAL_COMMUNITY): Payer: Medicare Other

## 2011-06-13 ENCOUNTER — Encounter: Payer: Self-pay | Admitting: Internal Medicine

## 2011-06-27 ENCOUNTER — Ambulatory Visit (INDEPENDENT_AMBULATORY_CARE_PROVIDER_SITE_OTHER): Payer: Medicare Other | Admitting: *Deleted

## 2011-06-27 DIAGNOSIS — I749 Embolism and thrombosis of unspecified artery: Secondary | ICD-10-CM

## 2011-06-27 DIAGNOSIS — I8290 Acute embolism and thrombosis of unspecified vein: Secondary | ICD-10-CM

## 2011-06-27 LAB — POCT INR: INR: 2.1

## 2011-06-27 MED ORDER — WARFARIN SODIUM 5 MG PO TABS: 5.0000 mg | ORAL_TABLET | Freq: Every day | ORAL | Status: DC

## 2011-07-01 ENCOUNTER — Telehealth (HOSPITAL_COMMUNITY): Payer: Self-pay | Admitting: *Deleted

## 2011-07-01 MED ORDER — ALLOPURINOL 100 MG PO TABS: 100.0000 mg | ORAL_TABLET | Freq: Every day | ORAL | Status: DC

## 2011-07-01 MED ORDER — SPIRONOLACTONE 25 MG PO TABS: 25.0000 mg | ORAL_TABLET | Freq: Every day | ORAL | Status: DC

## 2011-07-01 NOTE — Telephone Encounter (Signed)
Pt aware prescriptions sent in

## 2011-07-01 NOTE — Telephone Encounter (Signed)
[name] called today, he needs refills on the following medications:  Spironolactone, allopurinol.  Thanks.

## 2011-07-02 ENCOUNTER — Encounter (HOSPITAL_COMMUNITY): Payer: Medicare Other

## 2011-07-04 ENCOUNTER — Other Ambulatory Visit: Payer: Self-pay | Admitting: Internal Medicine

## 2011-07-06 ENCOUNTER — Encounter (HOSPITAL_COMMUNITY): Payer: Self-pay | Admitting: Physical Medicine and Rehabilitation

## 2011-07-06 ENCOUNTER — Emergency Department (HOSPITAL_COMMUNITY): Payer: Medicare Other

## 2011-07-06 ENCOUNTER — Inpatient Hospital Stay (HOSPITAL_COMMUNITY)
Admission: EM | Admit: 2011-07-06 | Discharge: 2011-07-16 | DRG: 286 | Disposition: A | Discharge status: Home Health | Payer: Medicare Other | Attending: Internal Medicine | Admitting: Internal Medicine

## 2011-07-06 ENCOUNTER — Other Ambulatory Visit: Payer: Self-pay

## 2011-07-06 DIAGNOSIS — G43909 Migraine, unspecified, not intractable, without status migrainosus: Secondary | ICD-10-CM | POA: Diagnosis present

## 2011-07-06 DIAGNOSIS — I059 Rheumatic mitral valve disease, unspecified: Secondary | ICD-10-CM | POA: Diagnosis present

## 2011-07-06 DIAGNOSIS — E875 Hyperkalemia: Secondary | ICD-10-CM | POA: Diagnosis not present

## 2011-07-06 DIAGNOSIS — R791 Abnormal coagulation profile: Secondary | ICD-10-CM | POA: Diagnosis present

## 2011-07-06 DIAGNOSIS — I5189 Other ill-defined heart diseases: Secondary | ICD-10-CM | POA: Diagnosis present

## 2011-07-06 DIAGNOSIS — F329 Major depressive disorder, single episode, unspecified: Secondary | ICD-10-CM | POA: Diagnosis present

## 2011-07-06 DIAGNOSIS — Z95810 Presence of automatic (implantable) cardiac defibrillator: Secondary | ICD-10-CM

## 2011-07-06 DIAGNOSIS — F17200 Nicotine dependence, unspecified, uncomplicated: Secondary | ICD-10-CM

## 2011-07-06 DIAGNOSIS — I4729 Other ventricular tachycardia: Secondary | ICD-10-CM | POA: Diagnosis present

## 2011-07-06 DIAGNOSIS — I5022 Chronic systolic (congestive) heart failure: Secondary | ICD-10-CM

## 2011-07-06 DIAGNOSIS — I513 Intracardiac thrombosis, not elsewhere classified: Secondary | ICD-10-CM

## 2011-07-06 DIAGNOSIS — I428 Other cardiomyopathies: Secondary | ICD-10-CM | POA: Diagnosis present

## 2011-07-06 DIAGNOSIS — I472 Ventricular tachycardia, unspecified: Secondary | ICD-10-CM | POA: Diagnosis present

## 2011-07-06 DIAGNOSIS — N189 Chronic kidney disease, unspecified: Secondary | ICD-10-CM | POA: Diagnosis present

## 2011-07-06 DIAGNOSIS — K219 Gastro-esophageal reflux disease without esophagitis: Secondary | ICD-10-CM | POA: Diagnosis present

## 2011-07-06 DIAGNOSIS — R109 Unspecified abdominal pain: Secondary | ICD-10-CM | POA: Diagnosis present

## 2011-07-06 DIAGNOSIS — R570 Cardiogenic shock: Secondary | ICD-10-CM

## 2011-07-06 DIAGNOSIS — Z23 Encounter for immunization: Secondary | ICD-10-CM

## 2011-07-06 DIAGNOSIS — R079 Chest pain, unspecified: Secondary | ICD-10-CM

## 2011-07-06 DIAGNOSIS — E876 Hypokalemia: Secondary | ICD-10-CM | POA: Diagnosis present

## 2011-07-06 DIAGNOSIS — I509 Heart failure, unspecified: Secondary | ICD-10-CM | POA: Diagnosis present

## 2011-07-06 DIAGNOSIS — Z7901 Long term (current) use of anticoagulants: Secondary | ICD-10-CM

## 2011-07-06 DIAGNOSIS — Z9119 Patient's noncompliance with other medical treatment and regimen: Secondary | ICD-10-CM

## 2011-07-06 DIAGNOSIS — Z79899 Other long term (current) drug therapy: Secondary | ICD-10-CM

## 2011-07-06 DIAGNOSIS — I5023 Acute on chronic systolic (congestive) heart failure: Principal | ICD-10-CM | POA: Diagnosis present

## 2011-07-06 DIAGNOSIS — I129 Hypertensive chronic kidney disease with stage 1 through stage 4 chronic kidney disease, or unspecified chronic kidney disease: Secondary | ICD-10-CM | POA: Diagnosis present

## 2011-07-06 DIAGNOSIS — F3289 Other specified depressive episodes: Secondary | ICD-10-CM | POA: Diagnosis present

## 2011-07-06 DIAGNOSIS — I8290 Acute embolism and thrombosis of unspecified vein: Secondary | ICD-10-CM

## 2011-07-06 DIAGNOSIS — Z91199 Patient's noncompliance with other medical treatment and regimen due to unspecified reason: Secondary | ICD-10-CM

## 2011-07-06 HISTORY — DX: Sleep apnea, unspecified: G47.30

## 2011-07-06 LAB — COMPREHENSIVE METABOLIC PANEL
AST: 34 U/L (ref 0–37)
Albumin: 3.3 g/dL — ABNORMAL LOW (ref 3.5–5.2)
BUN: 20 mg/dL (ref 6–23)
Calcium: 8.2 mg/dL — ABNORMAL LOW (ref 8.4–10.5)
Chloride: 101 mEq/L (ref 96–112)
Creatinine, Ser: 1.32 mg/dL (ref 0.50–1.35)
GFR calc non Af Amer: 62 mL/min — ABNORMAL LOW (ref >90)
Total Bilirubin: 0.6 mg/dL (ref 0.3–1.2)

## 2011-07-06 LAB — APTT: aPTT: 77 seconds — ABNORMAL HIGH (ref 24–37)

## 2011-07-06 LAB — PRO B NATRIURETIC PEPTIDE: Pro B Natriuretic peptide (BNP): 5088 pg/mL — ABNORMAL HIGH (ref 0–125)

## 2011-07-06 LAB — D-DIMER, QUANTITATIVE: D-Dimer, Quant: 0.23 ug/mL-FEU (ref 0.00–0.48)

## 2011-07-06 LAB — CARDIAC PANEL(CRET KIN+CKTOT+MB+TROPI)
CK, MB: 1.5 ng/mL (ref 0.3–4.0)
Relative Index: 0.5 (ref 0.0–2.5)
Total CK: 273 U/L — ABNORMAL HIGH (ref 7–232)

## 2011-07-06 MED ORDER — DIGOXIN 125 MCG PO TABS
125.0000 ug | ORAL_TABLET | Freq: Every day | ORAL | Status: DC
  Administered 2011-07-07 – 2011-07-16 (×10): 125 ug via ORAL
  Filled 2011-07-06 (×10): qty 1

## 2011-07-06 MED ORDER — SODIUM CHLORIDE 0.9 % IJ SOLN: 3.0000 mL | INTRAMUSCULAR | Status: DC | PRN

## 2011-07-06 MED ORDER — LISINOPRIL 10 MG PO TABS
10.0000 mg | ORAL_TABLET | Freq: Two times a day (BID) | ORAL | Status: DC
  Administered 2011-07-07 – 2011-07-09 (×7): 10 mg via ORAL
  Filled 2011-07-06 (×9): qty 1

## 2011-07-06 MED ORDER — ONDANSETRON HCL 4 MG/2ML IJ SOLN: 4.0000 mg | Freq: Four times a day (QID) | INTRAMUSCULAR | Status: DC | PRN

## 2011-07-06 MED ORDER — SODIUM CHLORIDE 0.9 % IV SOLN
250.0000 mL | INTRAVENOUS | Status: DC | PRN
  Administered 2011-07-07: 250 mL via INTRAVENOUS

## 2011-07-06 MED ORDER — ACETAMINOPHEN 325 MG PO TABS
650.0000 mg | ORAL_TABLET | ORAL | Status: DC | PRN
Start: 2011-07-06 — End: 2011-07-08

## 2011-07-06 MED ORDER — TORSEMIDE 20 MG PO TABS
20.0000 mg | ORAL_TABLET | Freq: Two times a day (BID) | ORAL | Status: DC
  Administered 2011-07-07 – 2011-07-16 (×20): 20 mg via ORAL
  Filled 2011-07-06 (×21): qty 1

## 2011-07-06 MED ORDER — HYDRALAZINE HCL 50 MG PO TABS
75.0000 mg | ORAL_TABLET | Freq: Three times a day (TID) | ORAL | Status: DC
  Administered 2011-07-07 – 2011-07-08 (×5): 75 mg via ORAL
  Filled 2011-07-06 (×11): qty 1

## 2011-07-06 MED ORDER — SODIUM CHLORIDE 0.9 % IJ SOLN
3.0000 mL | Freq: Two times a day (BID) | INTRAMUSCULAR | Status: DC
  Administered 2011-07-07 – 2011-07-08 (×2): 3 mL via INTRAVENOUS

## 2011-07-06 MED ORDER — ISOSORBIDE MONONITRATE ER 30 MG PO TB24
30.0000 mg | ORAL_TABLET | Freq: Two times a day (BID) | ORAL | Status: DC
  Administered 2011-07-07 – 2011-07-16 (×19): 30 mg via ORAL
  Filled 2011-07-06 (×20): qty 1

## 2011-07-06 MED ORDER — SPIRONOLACTONE 25 MG PO TABS
25.0000 mg | ORAL_TABLET | Freq: Every day | ORAL | Status: DC
  Administered 2011-07-07 – 2011-07-12 (×6): 25 mg via ORAL
  Filled 2011-07-06 (×6): qty 1

## 2011-07-06 MED ORDER — ALBUTEROL SULFATE (5 MG/ML) 0.5% IN NEBU
INHALATION_SOLUTION | RESPIRATORY_TRACT | Status: AC
  Filled 2011-07-06: qty 1

## 2011-07-06 MED ORDER — CARVEDILOL 12.5 MG PO TABS
12.5000 mg | ORAL_TABLET | Freq: Two times a day (BID) | ORAL | Status: DC
  Administered 2011-07-07 – 2011-07-08 (×4): 12.5 mg via ORAL
  Filled 2011-07-06 (×7): qty 1

## 2011-07-06 NOTE — ED Provider Notes (Signed)
History     CSN: [id number]  Arrival date & time 07/06/11  1713   First MD Initiated Contact with Patient 07/06/11 1735      Chief Complaint  Patient presents with  . Chest Pain    (Consider location/radiation/quality/duration/timing/severity/associated sxs/prior treatment) Patient is a 51 y.o. male presenting with chest pain. The history is provided by the patient.  Chest Pain Duration of episode(s) is 4 days. Chest pain occurs constantly. The chest pain is unchanged. The pain is associated with breathing. The quality of the pain is described as tightness. The pain does not radiate. Chest pain is worsened by certain positions. Primary symptoms include shortness of breath and wheezing. Pertinent negatives for primary symptoms include no fever, no syncope, no cough, no palpitations, no abdominal pain, no nausea, no vomiting and no dizziness.  Pertinent negatives for associated symptoms include no numbness and no weakness.     Past Medical History  Diagnosis Date  . CHF (congestive heart failure)     EF- 10-15  . Medically noncompliant   . Mitral regurgitation   . Tobacco user   . HTN (hypertension)   . Depression   . AICD (automatic cardioverter/defibrillator) present   . GERD (gastroesophageal reflux disease)   . Substance abuse   . Migraines   . Chronic renal insufficiency   . Syncope     Past Surgical History  Procedure Date  . Cardiac defibrillator placement     Family History  Problem Relation Age of Onset  . Coronary artery disease Neg Hx     History  Substance Use Topics  . Smoking status: Former Games developer  . Smokeless tobacco: Never Used  . Alcohol Use: Yes      Review of Systems  Constitutional: Negative for fever and chills.  HENT: Negative for facial swelling.   Respiratory: Positive for chest tightness, shortness of breath and wheezing. Negative for cough.   Cardiovascular: Positive for chest pain. Negative for palpitations, leg swelling and  syncope.  Gastrointestinal: Positive for abdominal distention. Negative for nausea, vomiting and abdominal pain.  Musculoskeletal: Negative for myalgias, back pain and arthralgias.  Skin: Negative for pallor, rash and wound.  Neurological: Negative for dizziness, weakness, numbness and headaches.    Allergies  Review of patient's allergies indicates no known allergies.  Home Medications   Current Outpatient Rx  Name Route Sig Dispense Refill  . CARVEDILOL 12.5 MG PO TABS Oral Take 12.5 mg by mouth 2 (two) times daily with a meal.      . DIGOXIN 0.125 MG PO TABS Oral Take 125 mcg by mouth daily.      Marland Kitchen HYDRALAZINE HCL 50 MG PO TABS Oral Take 75 mg by mouth 3 (three) times daily.      Marland Kitchen LISINOPRIL 10 MG PO TABS Oral Take 1 tablet (10 mg total) by mouth 2 (two) times daily. 60 tablet 6  . SPIRONOLACTONE 25 MG PO TABS Oral Take 1 tablet (25 mg total) by mouth daily. 30 tablet 6  . TORSEMIDE 20 MG PO TABS Oral Take 20 mg by mouth 2 (two) times daily.     . WARFARIN SODIUM 5 MG PO TABS Oral Take 1 tablet (5 mg total) by mouth daily. 35 tablet 1  . ISOSORBIDE MONONITRATE ER 30 MG PO TB24  TAKE 1 TABLET BY MOUTH TWO TIMES A DAY 60 tablet 10    BP 94/68  Pulse 104  Temp(Src) 98.2 F (36.8 C) (Oral)  Resp 20  SpO2 97%  Physical Exam  Nursing note and vitals reviewed. Constitutional: He is oriented to person, place, and time. He appears well-developed and well-nourished. No distress.  HENT:  Head: Normocephalic and atraumatic.  Mouth/Throat: Oropharynx is clear and moist.  Eyes: EOM are normal. Pupils are equal, round, and reactive to light.  Neck: Normal range of motion. Neck supple.  Cardiovascular:       Mildly tachycardic, irregular  Pulmonary/Chest: Effort normal and breath sounds normal. No respiratory distress. He has no wheezes. He has no rales. He exhibits no tenderness.  Abdominal: Soft. Bowel sounds are normal.  Musculoskeletal: Normal range of motion. He exhibits no edema  and no tenderness.  Neurological: He is alert and oriented to person, place, and time.       Move all ext, no obvious neuro deficits  Skin: Skin is warm and dry. No rash noted. No erythema.  Psychiatric: He has a normal mood and affect. His behavior is normal.    ED Course  Procedures (including critical care time)  Labs Reviewed  COMPREHENSIVE METABOLIC PANEL - Abnormal; Notable for the following:    Potassium 3.3 (*)    Glucose, Bld 117 (*)    Calcium 8.2 (*)    Albumin 3.3 (*)    GFR calc non Af Amer 62 (*)    GFR calc Af Amer 72 (*)    All other components within normal limits  PRO B NATRIURETIC PEPTIDE - Abnormal; Notable for the following:    Pro B Natriuretic peptide (BNP) 5088.0 (*)    All other components within normal limits  CARDIAC PANEL(CRET KIN+CKTOT+MB+TROPI) - Abnormal; Notable for the following:    Total CK 273 (*)    All other components within normal limits  DIGOXIN LEVEL - Abnormal; Notable for the following:    Digoxin Level <0.3 (*)    All other components within normal limits  PROTIME-INR - Abnormal; Notable for the following:    Prothrombin Time 46.2 (*)    INR 4.88 (*)    All other components within normal limits  APTT - Abnormal; Notable for the following:    aPTT 77 (*)    All other components within normal limits  D-DIMER, QUANTITATIVE   Dg Chest 2 View  07/06/2011  *RADIOLOGY REPORT*  Clinical Data: Cough.  Congestion for 2 weeks.  Chest tightness. Nausea for 4 days.  Hypertension.  Tachycardia.  Ex-smoker.  CHEST - 2 VIEW  Comparison: 08/22/2008  Findings: Right-sided Port-A-Cath terminates at the cavoatrial junction or high right atrium.  Pacer / AICD device.  Right ventricle.  No lead discontinuity.  Midline trachea.  Mild cardiomegaly.  Mild interstitial thickening. No lobar consolidation.  There is mild vascular crowding/fullness in the right infrahilar region.  This is similar back to 09/26/2006.  IMPRESSION: Cardiomegaly with mild interstitial  thickening.  Favored to represent the sequelae of chronic bronchitis/smoking. No lobar consolidation.  Original Report Authenticated By: Consuello Bossier, M.D.     No diagnosis found.   Date: 07/06/2011  Rate: 105  Rhythm: sinus tachycardia  QRS Axis: normal  Intervals: normal  ST/T Wave abnormalities: ST segment depressions in lateral leads consistent with dig use and seen on prev EKG's  Conduction Disutrbances:none  Narrative Interpretation:   Old EKG Reviewed: unchanged    MDM   Cardiology to admit        Loren Racer, MD 07/07/11 2251

## 2011-07-06 NOTE — H&P (Addendum)
[name] is an 51 y.o. male.    Chief Complaint: Persistent nausea and intermittent chest tightness  HPI: 51 y/o male with a PMH of NICM LVEF 10% by echo from 11/16/2010, NYHA class III presenting with 2 days history of persistent nausea (no vomiting) and intermittent chest tightness. His chest discomfort is described as a tightness (like a band across the chest); it is non-radiating and not associated with diaphoresis or a worsening of his baseline nausea or shortness of breath. He states that he has been able to lay down flat in the bed except for the last 2 nights when he coughs when laying down.  He denies PND. He reports that he is compliant with his home medication, but doesn't seem to know the name of his cardiac meds if he doesn't look at his medication list.  He reports that his weight today (home weight) is 145 pounds, and a review of his clinic note shows that his weight was 153.7 pounds in 05/21/2011--thus there is no objective weight gain if his home weight is accurate.  On a good day, he is able to walk about 15 feet before having shortness of breath.  He has no shortness of breath at rest.  He has been evaluated for heart transplant in the past, and he was taken off the transplant list when he did not respond to page when a heart was available.  He has been a tobacco smoker, but he states that he quit smoking tobacco this year (no tobacco for last 12 days).    He has LV thrombus and has been on Coumadin.  His INR is 4.88 today.  His ECG showed sinus rhythm with T-wave inversion in leads V5-V6, left atrial enlargement and frequent PVCs.  When compared to prior ECG from 06/23/2008, there is no interval changes except for PVCs on today's ECG.  His first set of troponin I is negative.  His pro BNP is 5088 (was 2588 01/28/2011).  Currently, he is chest pain free but continue to have mild nausea.  Past Medical History  Diagnosis Date  . CHF (congestive heart failure)     EF- 10-15  .  Medically noncompliant   . Mitral regurgitation   . Tobacco user   . HTN (hypertension)   . Depression   . AICD (automatic cardioverter/defibrillator) present   . GERD (gastroesophageal reflux disease)   . Substance abuse   . Migraines   . Chronic renal insufficiency   . Syncope     Past Surgical History  Procedure Date  . Cardiac defibrillator placement     Family History  Problem Relation Age of Onset  . Coronary artery disease Neg Hx    Social History:  reports that he has quit smoking. He has never used smokeless tobacco. He reports that he drinks alcohol. He reports that he does not use illicit drugs.  Allergies: No Known Allergies  Medications Prior to Admission  Medication Dose Route Frequency Provider Last Rate Last Dose  . albuterol (PROVENTIL) (5 MG/ML) 0.5% nebulizer solution            Medications Prior to Admission  Medication Sig Dispense Refill  . carvedilol (COREG) 12.5 MG tablet Take 12.5 mg by mouth 2 (two) times daily with a meal.        . digoxin (LANOXIN) 0.125 MG tablet Take 125 mcg by mouth daily.        . hydrALAZINE (APRESOLINE) 50 MG tablet Take 75 mg by mouth 3 (three)  times daily.        Marland Kitchen lisinopril (PRINIVIL,ZESTRIL) 10 MG tablet Take 1 tablet (10 mg total) by mouth 2 (two) times daily.  60 tablet  6  . spironolactone (ALDACTONE) 25 MG tablet Take 1 tablet (25 mg total) by mouth daily.  30 tablet  6  . torsemide (DEMADEX) 20 MG tablet Take 20 mg by mouth 2 (two) times daily.       Marland Kitchen warfarin (COUMADIN) 5 MG tablet Take 1 tablet (5 mg total) by mouth daily.  35 tablet  1  . isosorbide mononitrate (IMDUR) 30 MG 24 hr tablet TAKE 1 TABLET BY MOUTH TWO TIMES A DAY  60 tablet  10    Results for orders placed during the hospital encounter of 07/06/11 (from the past 48 hour(s))  PRO B NATRIURETIC PEPTIDE     Status: Abnormal   Collection Time   07/06/11  5:43 PM      Component Value Range Comment   Pro B Natriuretic peptide (BNP) 5088.0 (*) 0 - 125  (pg/mL)   CARDIAC PANEL(CRET KIN+CKTOT+MB+TROPI)     Status: Abnormal   Collection Time   07/06/11  5:44 PM      Component Value Range Comment   Total CK 273 (*) 7 - 232 (U/L)    CK, MB 1.5  0.3 - 4.0 (ng/mL)    Troponin I <0.30  <0.30 (ng/mL)    Relative Index 0.5  0.0 - 2.5    D-DIMER, QUANTITATIVE     Status: Normal   Collection Time   07/06/11  5:53 PM      Component Value Range Comment   D-Dimer, Quant 0.23  0.00 - 0.48 (ug/mL-FEU)   COMPREHENSIVE METABOLIC PANEL     Status: Abnormal   Collection Time   07/06/11  5:53 PM      Component Value Range Comment   Sodium 138  135 - 145 (mEq/L)    Potassium 3.3 (*) 3.5 - 5.1 (mEq/L)    Chloride 101  96 - 112 (mEq/L)    CO2 25  19 - 32 (mEq/L)    Glucose, Bld 117 (*) 70 - 99 (mg/dL)    BUN 20  6 - 23 (mg/dL)    Creatinine, Ser 1.61  0.50 - 1.35 (mg/dL)    Calcium 8.2 (*) 8.4 - 10.5 (mg/dL)    Total Protein 6.4  6.0 - 8.3 (g/dL)    Albumin 3.3 (*) 3.5 - 5.2 (g/dL)    AST 34  0 - 37 (U/L)    ALT 26  0 - 53 (U/L)    Alkaline Phosphatase 100  39 - 117 (U/L)    Total Bilirubin 0.6  0.3 - 1.2 (mg/dL)    GFR calc non Af Amer 62 (*) >90 (mL/min)    GFR calc Af Amer 72 (*) >90 (mL/min)   DIGOXIN LEVEL     Status: Abnormal   Collection Time   07/06/11  6:28 PM      Component Value Range Comment   Digoxin Level <0.3 (*) 0.8 - 2.0 (ng/mL)   PROTIME-INR     Status: Abnormal   Collection Time   07/06/11  6:28 PM      Component Value Range Comment   Prothrombin Time 46.2 (*) 11.6 - 15.2 (seconds)    INR 4.88 (*) 0.00 - 1.49    APTT     Status: Abnormal   Collection Time   07/06/11  6:28 PM      Component  Value Range Comment   aPTT 77 (*) 24 - 37 (seconds)    Dg Chest 2 View  07/06/2011  *RADIOLOGY REPORT*  Clinical Data: Cough.  Congestion for 2 weeks.  Chest tightness. Nausea for 4 days.  Hypertension.  Tachycardia.  Ex-smoker.  CHEST - 2 VIEW  Comparison: 08/22/2008  Findings: Right-sided Port-A-Cath terminates at the cavoatrial junction  or high right atrium.  Pacer / AICD device.  Right ventricle.  No lead discontinuity.  Midline trachea.  Mild cardiomegaly.  Mild interstitial thickening. No lobar consolidation.  There is mild vascular crowding/fullness in the right infrahilar region.  This is similar back to 09/26/2006.  IMPRESSION: Cardiomegaly with mild interstitial thickening.  Favored to represent the sequelae of chronic bronchitis/smoking. No lobar consolidation.  Original Report Authenticated By: Consuello Bossier, M.D.    Review of Systems  Constitutional: Negative.  Negative for fever, chills, weight loss, malaise/fatigue and diaphoresis.  HENT: Negative.  Negative for hearing loss, ear pain, nosebleeds, congestion, neck pain, tinnitus and ear discharge.   Eyes: Negative.  Negative for blurred vision, double vision, photophobia, pain and discharge.  Respiratory: Positive for cough and shortness of breath. Negative for hemoptysis, sputum production and wheezing.   Cardiovascular: Positive for chest pain. Negative for palpitations, orthopnea, leg swelling and PND.  Gastrointestinal: Positive for nausea. Negative for heartburn, abdominal pain, diarrhea and constipation.  Genitourinary: Negative.  Negative for dysuria, urgency, frequency, hematuria and flank pain.  Musculoskeletal: Negative.  Negative for myalgias, back pain and joint pain.  Skin: Negative.  Negative for itching and rash.  Neurological: Negative.  Negative for dizziness, tingling, tremors, sensory change and headaches.  Endo/Heme/Allergies: Negative for polydipsia. Does not bruise/bleed easily.  Psychiatric/Behavioral: Negative.  Negative for depression, suicidal ideas and substance abuse.    Blood pressure 91/71, pulse 102, temperature 98.2 F (36.8 C), temperature source Oral, resp. rate 18, SpO2 97.00%. Physical Exam  Constitutional: He is oriented to person, place, and time. He appears well-developed and well-nourished.  HENT:  Head: Normocephalic.    Eyes: EOM are normal.  Neck: Normal range of motion. Neck supple. No JVD present. No tracheal deviation present. No thyromegaly present.  Cardiovascular: Exam reveals gallop (S3 gallop). Exam reveals no friction rub.   Murmur (3/6 holosystolic murmur radiating to the axilla) heard.      PMI is displaced laterally  Respiratory: Effort normal. No respiratory distress. He has no wheezes. He has rales. He exhibits no tenderness.  GI: Soft. Bowel sounds are normal. He exhibits no distension. There is no tenderness. There is no rebound and no guarding.  Musculoskeletal: Normal range of motion. He exhibits no edema and no tenderness.  Neurological: He is oriented to person, place, and time.  Skin: Skin is warm. No rash noted. No erythema.     Assessment/Plan  1.  Acute on Chronic no-ischemic Systolic Heart Failure, NYHA class 3, LVEF 10% by echo 2.  Mitral regurgitation-moderate to severe 3.  Left ventricular thrombus 4.  Supra-therapeutic on Coumadin 5.  NSVT s/p ICD 6. Hypokalemia  Plan: I will admit the patient to heart failure team because I am concerned about his mild but persistent nausea which could represent part of his CHF symptoms.  He has no objective volume overload clinically and by weights.  I will re-start his heart failure medication for now, but I think that he will need to have a discussion with heart failure team about long term plan for his heart failure.  He may benefit from home  inotrope or LVAD while he is waiting for heart transplant, but I will defer these discussion to his primary heart failure team.  In the meantime, I will hold his Coumadin tonight since he is supra-therapeutic on his INR. I will correct his potassium and check his Magnesium and phosphorus levels to see if they need to be corrected as well.  Adhvik Canady E 07/06/2011, 9:08 PM

## 2011-07-06 NOTE — ED Notes (Signed)
Pt has Power Port right on right side midclavicular

## 2011-07-06 NOTE — ED Notes (Addendum)
Pt presents to department via EMS for evaluation of midsternal chest tightness and SOB. Ongoing x4 days. afib on cardiac monitor. Nothing makes pain better. 5/10 pain upon arrival to ED. Received 5mg  albuterol. 20g R forearm. Pt is alert and oriented x4. BP 80/50 per EMS.

## 2011-07-06 NOTE — ED Notes (Signed)
Called to give report to Clydie Braun, Charity fundraiser.  Will call back in 15 minutes

## 2011-07-07 DIAGNOSIS — I5023 Acute on chronic systolic (congestive) heart failure: Principal | ICD-10-CM | POA: Diagnosis present

## 2011-07-07 DIAGNOSIS — I219 Acute myocardial infarction, unspecified: Secondary | ICD-10-CM

## 2011-07-07 DIAGNOSIS — R109 Unspecified abdominal pain: Secondary | ICD-10-CM | POA: Diagnosis present

## 2011-07-07 DIAGNOSIS — I059 Rheumatic mitral valve disease, unspecified: Secondary | ICD-10-CM

## 2011-07-07 LAB — CARDIAC PANEL(CRET KIN+CKTOT+MB+TROPI)
CK, MB: 1.7 ng/mL (ref 0.3–4.0)
CK, MB: 1.7 ng/mL (ref 0.3–4.0)
CK, MB: 1.6 ng/mL (ref 0.3–4.0)
Relative Index: 0.7 (ref 0.0–2.5)
Relative Index: 0.7 (ref 0.0–2.5)
Troponin I: <0.3 ng/mL (ref <0.30)

## 2011-07-07 LAB — CARBOXYHEMOGLOBIN
Carboxyhemoglobin: 1.5 % (ref 0.5–1.5)
Methemoglobin: 1.2 % (ref 0.0–1.5)
O2 Saturation: 75.1 %
Total hemoglobin: 13.5 g/dL (ref 13.5–18.0)

## 2011-07-07 LAB — URINE CULTURE
Colony Count: NO GROWTH
Culture  Setup Time: 201301132047

## 2011-07-07 LAB — COMPREHENSIVE METABOLIC PANEL
ALT: 25 U/L (ref 0–53)
AST: 31 U/L (ref 0–37)
Albumin: 3.5 g/dL (ref 3.5–5.2)
Alkaline Phosphatase: 96 U/L (ref 39–117)
CO2: 25 mEq/L (ref 19–32)
Chloride: 99 mEq/L (ref 96–112)
GFR calc non Af Amer: 59 mL/min — ABNORMAL LOW (ref >90)
Potassium: 3 mEq/L — ABNORMAL LOW (ref 3.5–5.1)
Sodium: 138 mEq/L (ref 135–145)
Total Bilirubin: 0.6 mg/dL (ref 0.3–1.2)

## 2011-07-07 LAB — BASIC METABOLIC PANEL
CO2: 28 mEq/L (ref 19–32)
Calcium: 9.1 mg/dL (ref 8.4–10.5)
Creatinine, Ser: 1.47 mg/dL — ABNORMAL HIGH (ref 0.50–1.35)
GFR calc non Af Amer: 55 mL/min — ABNORMAL LOW (ref >90)
Glucose, Bld: 142 mg/dL — ABNORMAL HIGH (ref 70–99)
Sodium: 140 mEq/L (ref 135–145)

## 2011-07-07 LAB — DIFFERENTIAL
Basophils Absolute: 0 10*3/uL (ref 0.0–0.1)
Basophils Relative: 0 % (ref 0–1)
Lymphocytes Relative: 32 % (ref 12–46)
Monocytes Absolute: 0.5 10*3/uL (ref 0.1–1.0)
Neutro Abs: 3 10*3/uL (ref 1.7–7.7)
Neutrophils Relative %: 57 % (ref 43–77)

## 2011-07-07 LAB — LACTIC ACID, PLASMA: Lactic Acid, Venous: 2 mmol/L (ref 0.5–2.2)

## 2011-07-07 LAB — PHOSPHORUS: Phosphorus: 2.8 mg/dL (ref 2.3–4.6)

## 2011-07-07 LAB — CBC
HCT: 40.4 % (ref 39.0–52.0)
Platelets: 256 10*3/uL (ref 150–400)
RDW: 13.6 % (ref 11.5–15.5)
WBC: 5.3 10*3/uL (ref 4.0–10.5)

## 2011-07-07 LAB — URINALYSIS, ROUTINE W REFLEX MICROSCOPIC
Bilirubin Urine: NEGATIVE
Ketones, ur: NEGATIVE mg/dL
Nitrite: NEGATIVE
Protein, ur: NEGATIVE mg/dL
pH: 5 (ref 5.0–8.0)

## 2011-07-07 LAB — INFLUENZA PANEL BY PCR (TYPE A & B)
Influenza A By PCR: NEGATIVE
Influenza B By PCR: NEGATIVE

## 2011-07-07 LAB — CULTURE, BLOOD (ROUTINE X 2): Culture  Setup Time: 201301132047

## 2011-07-07 LAB — MRSA PCR SCREENING: MRSA by PCR: NEGATIVE

## 2011-07-07 MED ORDER — POTASSIUM CHLORIDE CRYS ER 20 MEQ PO TBCR
40.0000 meq | EXTENDED_RELEASE_TABLET | Freq: Two times a day (BID) | ORAL | Status: DC
  Administered 2011-07-07 – 2011-07-10 (×9): 40 meq via ORAL
  Filled 2011-07-07 (×12): qty 2

## 2011-07-07 MED ORDER — GUAIFENESIN-DM 100-10 MG/5ML PO SYRP
15.0000 mL | ORAL_SOLUTION | ORAL | Status: DC | PRN
  Administered 2011-07-07: 15 mL via ORAL
  Filled 2011-07-07: qty 15
  Filled 2011-07-07: qty 10
  Filled 2011-07-07: qty 5

## 2011-07-07 MED ORDER — MAGNESIUM OXIDE 400 MG PO TABS
400.0000 mg | ORAL_TABLET | Freq: Two times a day (BID) | ORAL | Status: DC
  Administered 2011-07-07 – 2011-07-13 (×13): 400 mg via ORAL
  Filled 2011-07-07 (×14): qty 1

## 2011-07-07 MED ORDER — PNEUMOCOCCAL VAC POLYVALENT 25 MCG/0.5ML IJ INJ
0.5000 mL | INJECTION | INTRAMUSCULAR | Status: AC
  Administered 2011-07-08: 0.5 mL via INTRAMUSCULAR
  Filled 2011-07-07: qty 0.5

## 2011-07-07 MED ORDER — POTASSIUM CHLORIDE CRYS ER 20 MEQ PO TBCR
40.0000 meq | EXTENDED_RELEASE_TABLET | Freq: Once | ORAL | Status: AC
  Administered 2011-07-07: 40 meq via ORAL

## 2011-07-07 MED ORDER — ALPRAZOLAM 0.25 MG PO TABS
0.2500 mg | ORAL_TABLET | Freq: Once | ORAL | Status: AC
  Administered 2011-07-07: 0.25 mg via ORAL
  Filled 2011-07-07: qty 1

## 2011-07-07 MED ORDER — POTASSIUM CHLORIDE IN NACL 20-0.45 MEQ/L-% IV SOLN
INTRAVENOUS | Status: AC
  Filled 2011-07-07: qty 1000

## 2011-07-07 MED ORDER — ZOLPIDEM TARTRATE 5 MG PO TABS
10.0000 mg | ORAL_TABLET | Freq: Every evening | ORAL | Status: DC | PRN
  Administered 2011-07-07 – 2011-07-15 (×2): 10 mg via ORAL
  Filled 2011-07-07 (×2): qty 2

## 2011-07-07 MED ORDER — PHYTONADIONE 5 MG PO TABS
2.5000 mg | ORAL_TABLET | Freq: Once | ORAL | Status: AC
  Administered 2011-07-07: 2.5 mg via ORAL
  Filled 2011-07-07: qty 1

## 2011-07-07 NOTE — Progress Notes (Signed)
  Echocardiogram 2D Echocardiogram has been performed.  [name] [name] 07/07/2011, 11:04 AM

## 2011-07-07 NOTE — Progress Notes (Signed)
ANTICOAGULATION CONSULT NOTE - Follow Up Consult  Pharmacy Consult for Coumadin Indication: LV Thrombus  No Known Allergies  Patient Measurements: Height: 5\' 5"  (165.1 cm) Weight: 145 lb 8.1 oz (66 kg) IBW/kg (Calculated) : 61.5  Adjusted Body Weight:   Vital Signs: Temp: 98.2 F (36.8 C) (01/13 1156) Temp src: Oral (01/13 1156) BP: 84/66 mmHg (01/13 0830) Pulse Rate: 113  (01/13 0830)  Labs:  Basename 07/07/11 0903 07/06/11 2352 07/06/11 1828 07/06/11 1753 07/06/11 1744  HGB -- 14.0 -- -- --  HCT -- 40.4 -- -- --  PLT -- 256 -- -- --  APTT -- 68* 77* -- --  LABPROT -- 41.1* 46.2* -- --  INR -- 4.20* 4.88* -- --  HEPARINUNFRC -- -- -- -- --  CREATININE 1.47* 1.38* -- 1.32 --  CKTOTAL 251* 262* -- -- 273*  CKMB 1.7 1.7 -- -- 1.5  TROPONINI <0.30 <0.30 -- -- <0.30   Estimated Creatinine Clearance: 53.5 ml/min (by C-G formula based on Cr of 1.47).   Assessment: 50 yoM w/ LV thrombus on Coumadin PTA with supratherapeutic INR 4.8.  INR dropped 4.8-->4.2 overnight.  Vit K 2.5mg  PO given @ 1330 today, RHC planned for when INR < 3.    Goal of Therapy:  INR 2-3   Plan: No coumadin tonight. Recheck INR in AM.  F/u plans for RHC, and dose coumadin as appropriate depending on INR and cath plans.     Haynes Hoehn, PharmD 07/07/2011 2:11 PM  Pager: [phone]

## 2011-07-07 NOTE — Progress Notes (Signed)
Subjective:   51 y/o male with a h/o of severe systolic HF due to NICM LVEF 10% by echo from 11/16/2010, admitted yesterday with several week h/o of chest and ab pain with n/v suspected due to low output. BNP up but weight down due to poor appetite.  Feels ok today. Minimal ab discomfort. No CP. No orthopnea or PND. INR > 4    Intake/Output Summary (Last 24 hours) at 07/07/11 1137 Last data filed at 07/07/11 0900  Gross per 24 hour  Intake    735 ml  Output   1801 ml  Net  -1066 ml    Current meds:    . albuterol      . ALPRAZolam  0.25 mg Oral Once  . carvedilol  12.5 mg Oral BID WC  . digoxin  125 mcg Oral Daily  . hydrALAZINE  75 mg Oral TID  . isosorbide mononitrate  30 mg Oral BID  . lisinopril  10 mg Oral BID  . magnesium oxide  400 mg Oral BID  . pneumococcal 23 valent vaccine  0.5 mL Intramuscular Tomorrow-1000  . potassium chloride  40 mEq Oral BID  . potassium chloride  40 mEq Oral Once  . sodium chloride  3 mL Intravenous Q12H  . spironolactone  25 mg Oral Daily  . torsemide  20 mg Oral BID   Infusions:    . 0.45 % NaCl with KCl 20 mEq / L       Objective:  Blood pressure 84/66, pulse 113, temperature 98 F (36.7 C), temperature source Oral, resp. rate 15, height 5\' 5"  (1.651 m), weight 66 kg (145 lb 8.1 oz), SpO2 96.00%. Weight change:   Physical Exam: General:  Well appearing. No resp difficulty HEENT: normal Neck: supple. JVP hard to see appears up. Carotids 2+ bilat; no bruits. No lymphadenopathy or thryomegaly appreciated. Cor: PMI laterally displaced. Regular rate & rhythm. No rubs or murmurs. +s3 Lungs: clear Abdomen: soft, minimally tender, mildly distended. No hepatosplenomegaly. No bruits or masses. Good bowel sounds. Extremities: no cyanosis, clubbing, rash, edema Neuro: alert & orientedx3, cranial nerves grossly intact. moves all 4 extremities w/o difficulty. Affect pleasant  Telemetry: sinus tach low 100s  Lab Results: Basic  Metabolic Panel:  Lab 07/07/11 1610 07/06/11 2352 07/06/11 1753  NA 140 138 138  K 3.6 3.0* --  CL 100 99 101  CO2 28 25 25   GLUCOSE 142* 96 117*  BUN 21 20 20   CREATININE 1.47* 1.38* 1.32  CALCIUM 9.1 8.7 8.2*  MG -- 1.6 --  PHOS -- 2.8 --   Liver Function Tests:  Lab 07/06/11 2352 07/06/11 1753  AST 31 34  ALT 25 26  ALKPHOS 96 100  BILITOT 0.6 0.6  PROT 6.9 6.4  ALBUMIN 3.5 3.3*   No results found for this basename: LIPASE:5,AMYLASE:5 in the last 168 hours No results found for this basename: AMMONIA:5 in the last 168 hours CBC:  Lab 07/06/11 2352  WBC 5.3  NEUTROABS 3.0  HGB 14.0  HCT 40.4  MCV 88.4  PLT 256   Cardiac Enzymes:  Lab 07/07/11 0903 07/06/11 2352 07/06/11 1744  CKTOTAL 251* 262* 273*  CKMB 1.7 1.7 1.5  CKMBINDEX -- -- --  TROPONINI <0.30 <0.30 <0.30   BNP: No components found with this basename: POCBNP:5 CBG: No results found for this basename: GLUCAP:5 in the last 168 hours Microbiology: Lab Results  Component Value Date   CULT NO GROWTH 5 DAYS 04/15/2008   No  results found for this basename: CULT:2,SDES:2 in the last 168 hours  Imaging: Dg Chest 2 View  07/06/2011  *RADIOLOGY REPORT*  Clinical Data: Cough.  Congestion for 2 weeks.  Chest tightness. Nausea for 4 days.  Hypertension.  Tachycardia.  Ex-smoker.  CHEST - 2 VIEW  Comparison: 08/22/2008  Findings: Right-sided Port-A-Cath terminates at the cavoatrial junction or high right atrium.  Pacer / AICD device.  Right ventricle.  No lead discontinuity.  Midline trachea.  Mild cardiomegaly.  Mild interstitial thickening. No lobar consolidation.  There is mild vascular crowding/fullness in the right infrahilar region.  This is similar back to 09/26/2006.  IMPRESSION: Cardiomegaly with mild interstitial thickening.  Favored to represent the sequelae of chronic bronchitis/smoking. No lobar consolidation.  Original Report Authenticated By: Consuello Bossier, M.D.     ASSESSMENT:  1. Acute on  Chronic no-ischemic Systolic Heart Failure, NYHA class 3, LVEF 10% by echo  (suspect low output) 2. Mitral regurgitation-moderate to severe  3. Left ventricular thrombus  4. Supra-therapeutic on Coumadin  5. NSVT s/p ICD  6. Hypokalemia 7. Ab pain - suspect due to #1 8. Tobacco use  PLAN/DISCUSSION:  Suspect his symptoms are due to low-output. Will need RHC to further assess. Will give 2.5 mg vit K to help bring INR down RHC when INR < 3. Continue current HF meds except will cut b-blocker in half.  Daniel Bensimhon,MD 11:43 AM     LOS: 1 day

## 2011-07-08 ENCOUNTER — Encounter (HOSPITAL_COMMUNITY): Payer: Self-pay | Admitting: Internal Medicine

## 2011-07-08 ENCOUNTER — Encounter (HOSPITAL_COMMUNITY)
Admission: EM | Disposition: A | Discharge status: Home Health | Payer: Self-pay | Source: Home / Self Care | Attending: Internal Medicine

## 2011-07-08 DIAGNOSIS — I509 Heart failure, unspecified: Secondary | ICD-10-CM

## 2011-07-08 HISTORY — PX: RIGHT HEART CATHETERIZATION: SHX5447

## 2011-07-08 LAB — POCT I-STAT 3, VENOUS BLOOD GAS (G3P V)
Acid-Base Excess: 3 mmol/L — ABNORMAL HIGH (ref 0.0–2.0)
O2 Saturation: 58 %
TCO2: 28 mmol/L (ref 0–100)

## 2011-07-08 LAB — CULTURE, BLOOD (ROUTINE X 2): Culture  Setup Time: 201301132047

## 2011-07-08 LAB — POCT I-STAT 3, ART BLOOD GAS (G3+)
Bicarbonate: 28.7 mEq/L — ABNORMAL HIGH (ref 20.0–24.0)
O2 Saturation: 96 %
TCO2: 30 mmol/L (ref 0–100)
pCO2 arterial: 39.1 mmHg (ref 35.0–45.0)
pO2, Arterial: 74 mmHg — ABNORMAL LOW (ref 80.0–100.0)

## 2011-07-08 LAB — BASIC METABOLIC PANEL
BUN: 22 mg/dL (ref 6–23)
CO2: 29 mEq/L (ref 19–32)
Calcium: 9.1 mg/dL (ref 8.4–10.5)
Chloride: 98 mEq/L (ref 96–112)
Creatinine, Ser: 1.29 mg/dL (ref 0.50–1.35)

## 2011-07-08 LAB — PROTIME-INR: Prothrombin Time: 24.1 seconds — ABNORMAL HIGH (ref 11.6–15.2)

## 2011-07-08 SURGERY — RIGHT HEART CATH
Anesthesia: LOCAL

## 2011-07-08 MED ORDER — ONDANSETRON HCL 4 MG/2ML IJ SOLN: 4.0000 mg | Freq: Four times a day (QID) | INTRAMUSCULAR | Status: DC | PRN

## 2011-07-08 MED ORDER — NICOTINE 21 MG/24HR TD PT24
21.0000 mg | MEDICATED_PATCH | Freq: Every day | TRANSDERMAL | Status: DC
  Administered 2011-07-08 – 2011-07-16 (×10): 21 mg via TRANSDERMAL
  Filled 2011-07-08 (×10): qty 1

## 2011-07-08 MED ORDER — SODIUM CHLORIDE 0.9 % IJ SOLN: 3.0000 mL | INTRAMUSCULAR | Status: DC | PRN

## 2011-07-08 MED ORDER — FENTANYL CITRATE 0.05 MG/ML IJ SOLN
INTRAMUSCULAR | Status: AC
  Filled 2011-07-08: qty 2

## 2011-07-08 MED ORDER — MILRINONE IN DEXTROSE 200-5 MCG/ML-% IV SOLN
0.1250 ug/kg/min | INTRAVENOUS | Status: DC
  Administered 2011-07-08 – 2011-07-12 (×6): 0.25 ug/kg/min via INTRAVENOUS
  Administered 2011-07-13: 0.125 ug/kg/min via INTRAVENOUS
  Filled 2011-07-08 (×8): qty 100

## 2011-07-08 MED ORDER — DIAZEPAM 5 MG PO TABS
5.0000 mg | ORAL_TABLET | ORAL | Status: AC
  Administered 2011-07-08: 5 mg via ORAL
  Filled 2011-07-08: qty 1

## 2011-07-08 MED ORDER — ASPIRIN 81 MG PO CHEW
324.0000 mg | CHEWABLE_TABLET | ORAL | Status: AC
  Administered 2011-07-08: 324 mg via ORAL
  Filled 2011-07-08: qty 4

## 2011-07-08 MED ORDER — SODIUM CHLORIDE 0.9 % IJ SOLN
3.0000 mL | Freq: Two times a day (BID) | INTRAMUSCULAR | Status: DC
  Administered 2011-07-08 – 2011-07-11 (×4): 3 mL via INTRAVENOUS

## 2011-07-08 MED ORDER — HEPARIN (PORCINE) IN NACL 2-0.9 UNIT/ML-% IJ SOLN
INTRAMUSCULAR | Status: AC
  Filled 2011-07-08: qty 1000

## 2011-07-08 MED ORDER — MIDAZOLAM HCL 2 MG/2ML IJ SOLN
INTRAMUSCULAR | Status: AC
  Filled 2011-07-08: qty 2

## 2011-07-08 MED ORDER — SODIUM CHLORIDE 0.9 % IV SOLN
250.0000 mL | INTRAVENOUS | Status: DC | PRN
  Administered 2011-07-10: 250 mL via INTRAVENOUS
  Administered 2011-07-11: 17:00:00 via INTRAVENOUS

## 2011-07-08 MED ORDER — WARFARIN SODIUM 5 MG PO TABS
5.0000 mg | ORAL_TABLET | Freq: Once | ORAL | Status: AC
  Administered 2011-07-08: 5 mg via ORAL
  Filled 2011-07-08: qty 1

## 2011-07-08 MED ORDER — LIDOCAINE HCL (PF) 1 % IJ SOLN
INTRAMUSCULAR | Status: AC
  Filled 2011-07-08: qty 30

## 2011-07-08 MED ORDER — ACETAMINOPHEN 325 MG PO TABS
650.0000 mg | ORAL_TABLET | ORAL | Status: DC | PRN
  Administered 2011-07-09: 650 mg via ORAL
  Filled 2011-07-08: qty 2

## 2011-07-08 MED ORDER — SODIUM CHLORIDE 0.9 % IV SOLN
250.0000 mL | INTRAVENOUS | Status: DC | PRN
  Administered 2011-07-13: 15 mL via INTRAVENOUS

## 2011-07-08 NOTE — H&P (View-Only) (Signed)
 Subjective:   51 y/o male with a h/o of severe systolic HF due to NICM LVEF 10% by echo from 11/16/2010, admitted yesterday with several week h/o of chest and ab pain with n/v suspected due to low output. BNP up but weight down due to poor appetite.  Feels ok today. Minimal ab discomfort. No CP. No orthopnea or PND. INR > 4    Intake/Output Summary (Last 24 hours) at 07/07/11 1137 Last data filed at 07/07/11 0900  Gross per 24 hour  Intake    735 ml  Output   1801 ml  Net  -1066 ml    Current meds:    . albuterol      . ALPRAZolam  0.25 mg Oral Once  . carvedilol  12.5 mg Oral BID WC  . digoxin  125 mcg Oral Daily  . hydrALAZINE  75 mg Oral TID  . isosorbide mononitrate  30 mg Oral BID  . lisinopril  10 mg Oral BID  . magnesium oxide  400 mg Oral BID  . pneumococcal 23 valent vaccine  0.5 mL Intramuscular Tomorrow-1000  . potassium chloride  40 mEq Oral BID  . potassium chloride  40 mEq Oral Once  . sodium chloride  3 mL Intravenous Q12H  . spironolactone  25 mg Oral Daily  . torsemide  20 mg Oral BID   Infusions:    . 0.45 % NaCl with KCl 20 mEq / L       Objective:  Blood pressure 84/66, pulse 113, temperature 98 F (36.7 C), temperature source Oral, resp. rate 15, height 5' 5" (1.651 m), weight 66 kg (145 lb 8.1 oz), SpO2 96.00%. Weight change:   Physical Exam: General:  Well appearing. No resp difficulty HEENT: normal Neck: supple. JVP hard to see appears up. Carotids 2+ bilat; no bruits. No lymphadenopathy or thryomegaly appreciated. Cor: PMI laterally displaced. Regular rate & rhythm. No rubs or murmurs. +s3 Lungs: clear Abdomen: soft, minimally tender, mildly distended. No hepatosplenomegaly. No bruits or masses. Good bowel sounds. Extremities: no cyanosis, clubbing, rash, edema Neuro: alert & orientedx3, cranial nerves grossly intact. moves all 4 extremities w/o difficulty. Affect pleasant  Telemetry: sinus tach low 100s  Lab Results: Basic  Metabolic Panel:  Lab 07/07/11 0903 07/06/11 2352 07/06/11 1753  NA 140 138 138  K 3.6 3.0* --  CL 100 99 101  CO2 28 25 25  GLUCOSE 142* 96 117*  BUN 21 20 20  CREATININE 1.47* 1.38* 1.32  CALCIUM 9.1 8.7 8.2*  MG -- 1.6 --  PHOS -- 2.8 --   Liver Function Tests:  Lab 07/06/11 2352 07/06/11 1753  AST 31 34  ALT 25 26  ALKPHOS 96 100  BILITOT 0.6 0.6  PROT 6.9 6.4  ALBUMIN 3.5 3.3*   No results found for this basename: LIPASE:5,AMYLASE:5 in the last 168 hours No results found for this basename: AMMONIA:5 in the last 168 hours CBC:  Lab 07/06/11 2352  WBC 5.3  NEUTROABS 3.0  HGB 14.0  HCT 40.4  MCV 88.4  PLT 256   Cardiac Enzymes:  Lab 07/07/11 0903 07/06/11 2352 07/06/11 1744  CKTOTAL 251* 262* 273*  CKMB 1.7 1.7 1.5  CKMBINDEX -- -- --  TROPONINI <0.30 <0.30 <0.30   BNP: No components found with this basename: POCBNP:5 CBG: No results found for this basename: GLUCAP:5 in the last 168 hours Microbiology: Lab Results  Component Value Date   CULT NO GROWTH 5 DAYS 04/15/2008   No   results found for this basename: CULT:2,SDES:2 in the last 168 hours  Imaging: Dg Chest 2 View  07/06/2011  *RADIOLOGY REPORT*  Clinical Data: Cough.  Congestion for 2 weeks.  Chest tightness. Nausea for 4 days.  Hypertension.  Tachycardia.  Ex-smoker.  CHEST - 2 VIEW  Comparison: 08/22/2008  Findings: Right-sided Port-A-Cath terminates at the cavoatrial junction or high right atrium.  Pacer / AICD device.  Right ventricle.  No lead discontinuity.  Midline trachea.  Mild cardiomegaly.  Mild interstitial thickening. No lobar consolidation.  There is mild vascular crowding/fullness in the right infrahilar region.  This is similar back to 09/26/2006.  IMPRESSION: Cardiomegaly with mild interstitial thickening.  Favored to represent the sequelae of chronic bronchitis/smoking. No lobar consolidation.  Original Report Authenticated By: KYLE D. TALBOT, M.D.     ASSESSMENT:  1. Acute on  Chronic no-ischemic Systolic Heart Failure, NYHA class 3, LVEF 10% by echo  (suspect low output) 2. Mitral regurgitation-moderate to severe  3. Left ventricular thrombus  4. Supra-therapeutic on Coumadin  5. NSVT s/p ICD  6. Hypokalemia 7. Ab pain - suspect due to #1 8. Tobacco use  PLAN/DISCUSSION:  Suspect his symptoms are due to low-output. Will need RHC to further assess. Will give 2.5 mg vit K to help bring INR down RHC when INR < 3. Continue current HF meds except will cut b-blocker in half.  Daniel Bensimhon,MD 11:43 AM     LOS: 1 day    

## 2011-07-08 NOTE — Op Note (Signed)
Cardiac Cath Procedure Note:  Indication:  HF  Procedures performed:  1) Right heart catheterization  Description of procedure:   The risks and indication of the procedure were explained. Consent was signed and placed on the chart. An appropriate timeout was taken prior to the procedure. The right groin was prepped and draped in the routine sterile fashion and anesthetized with 1% local lidocaine.   A 7 FR venous sheath was placed in the right femoral vein using a modified Seldinger technique. A standard Swan-Ganz catheter was used for the procedure.   Complications: None apparent.  Findings:  RA = 7 RV =  45/6/7 PA =  45/31 (35) PCW = 20 Fick cardiac output/index = 3.06/1.75 PVR =  5.2 Woods FA sat = 96% PA sat = 58%, 56%  Assessment: 1. Mildly elevated filling pressures 2. Low cardiac output  Plan/Discussion:  Will treat with short course of milrinone to tune-up.  Daniel Bensimhon 4:01 PM

## 2011-07-08 NOTE — Consult Note (Signed)
Pt in cath lab now. Will revisit another time.

## 2011-07-08 NOTE — Progress Notes (Signed)
Advanced Heart Failure  Subjective:  51 y/o male with a h/o of severe systolic HF due to NICM LVEF 10% by echo from 11/16/2010, admitted  with several week h/o of chest and abdominal  pain with n/v suspected due to low output. Pro-BNP 5088 up, but weight down due to poor appetite. He presented with NYHA IV symptoms. Able to obtain medications however he did stop taking Digoxin prior to admit.    He has been evaluated for heart transplant in the past, and he was taken off the transplant list when he did not respond to page when a heart was available. He has been a tobacco smoker, but he states that he quit smoking tobacco this year (no tobacco for last 12 days). He has been home on home Milrinone in the past. Chronic coumadin to LV thrombus.   Feels ok. Denies SOB/PND/CP/Orthopnea. INR <3  Intake/Output Summary (Last 24 hours) at 07/08/11 0947 Last data filed at 07/08/11 0800  Gross per 24 hour  Intake   1440 ml  Output   2600 ml  Net  -1160 ml    Current meds:    . carvedilol  12.5 mg Oral BID WC  . digoxin  125 mcg Oral Daily  . hydrALAZINE  75 mg Oral TID  . isosorbide mononitrate  30 mg Oral BID  . lisinopril  10 mg Oral BID  . magnesium oxide  400 mg Oral BID  . phytonadione  2.5 mg Oral Once  . pneumococcal 23 valent vaccine  0.5 mL Intramuscular Tomorrow-1000  . potassium chloride  40 mEq Oral BID  . sodium chloride  3 mL Intravenous Q12H  . spironolactone  25 mg Oral Daily  . torsemide  20 mg Oral BID   Infusions:     Objective:  Blood pressure 77/48, pulse 86, temperature 98.4 F (36.9 C), temperature source Oral, resp. rate 13, height 5\' 5"  (1.651 m), weight 68.2 kg (150 lb 5.7 oz), SpO2 92.00%. Weight change: 1.4 kg (3 lb 1.4 oz)  Physical Exam: General:  Well appearing. No resp difficulty HEENT: normal Neck: supple. JVP hard to see . Carotids 2+ bilat; no bruits. No lymphadenopathy or thryomegaly appreciated. Cor: PMI nondisplaced. Regular rate & rhythm. S 3  rubs, gallops or murmurs. Lungs: clear Abdomen: soft, nontender, nondistended. No hepatosplenomegaly. No bruits or masses. Good bowel sounds. Extremities: no cyanosis, clubbing, rash, edema Neuro: alert & orientedx3, cranial nerves grossly intact. moves all 4 extremities w/o difficulty. Affect pleasant  Telemetry: SR NSVT  Lab Results: Basic Metabolic Panel:  Lab 07/08/11 1610 07/07/11 0903 07/06/11 2352 07/06/11 1753  NA 137 140 138 138  K 3.8 3.6 -- --  CL 98 100 99 101  CO2 29 28 25 25   GLUCOSE 100* 142* 96 117*  BUN 22 21 20 20   CREATININE 1.29 1.47* 1.38* 1.32  CALCIUM 9.1 9.1 8.7 8.2*  MG 1.5 -- 1.6 --  PHOS -- -- 2.8 --   Liver Function Tests:  Lab 07/06/11 2352 07/06/11 1753  AST 31 34  ALT 25 26  ALKPHOS 96 100  BILITOT 0.6 0.6  PROT 6.9 6.4  ALBUMIN 3.5 3.3*   No results found for this basename: LIPASE:5,AMYLASE:5 in the last 168 hours No results found for this basename: AMMONIA:5 in the last 168 hours CBC:  Lab 07/06/11 2352  WBC 5.3  NEUTROABS 3.0  HGB 14.0  HCT 40.4  MCV 88.4  PLT 256   Cardiac Enzymes:  Lab 07/07/11 1620 07/07/11 0903  07/06/11 2352 07/06/11 1744  CKTOTAL 235* 251* 262* 273*  CKMB 1.6 1.7 1.7 1.5  CKMBINDEX -- -- -- --  TROPONINI <0.30 <0.30 <0.30 <0.30   BNP: No components found with this basename: POCBNP:5 CBG: No results found for this basename: GLUCAP:5 in the last 168 hours Microbiology: Lab Results  Component Value Date   CULT        BLOOD CULTURE RECEIVED NO GROWTH TO DATE CULTURE WILL BE HELD FOR 5 DAYS BEFORE ISSUING A FINAL NEGATIVE REPORT 07/07/2011   CULT        BLOOD CULTURE RECEIVED NO GROWTH TO DATE CULTURE WILL BE HELD FOR 5 DAYS BEFORE ISSUING A FINAL NEGATIVE REPORT 07/07/2011   CULT NO GROWTH 5 DAYS 04/15/2008    Lab 07/07/11 1615 07/07/11 1545  CULT       BLOOD CULTURE RECEIVED NO GROWTH TO DATE CULTURE WILL BE HELD FOR 5 DAYS BEFORE ISSUING A FINAL NEGATIVE REPORT       BLOOD CULTURE RECEIVED NO GROWTH  TO DATE CULTURE WILL BE HELD FOR 5 DAYS BEFORE ISSUING A FINAL NEGATIVE REPORT  SDES BLOOD RIGHT HAND BLOOD LEFT HAND    Imaging: Dg Chest 2 View  07/06/2011  *RADIOLOGY REPORT*  Clinical Data: Cough.  Congestion for 2 weeks.  Chest tightness. Nausea for 4 days.  Hypertension.  Tachycardia.  Ex-smoker.  CHEST - 2 VIEW  Comparison: 08/22/2008  Findings: Right-sided Port-A-Cath terminates at the cavoatrial junction or high right atrium.  Pacer / AICD device.  Right ventricle.  No lead discontinuity.  Midline trachea.  Mild cardiomegaly.  Mild interstitial thickening. No lobar consolidation.  There is mild vascular crowding/fullness in the right infrahilar region.  This is similar back to 09/26/2006.  IMPRESSION: Cardiomegaly with mild interstitial thickening.  Favored to represent the sequelae of chronic bronchitis/smoking. No lobar consolidation.  Original Report Authenticated By: Consuello Bossier, M.D.     ASSESSMENT:  1. Acute on Chronic no-ischemic Systolic Heart Failure, NYHA class 3, LVEF 10% by echo (suspect low output)  2. Mitral regurgitation-moderate to severe  3. Left ventricular thrombus  4. Supra-therapeutic on Coumadin  5. NSVT s/p ICD  6. Hypokalemia  7. Ab pain - suspect due to #1  8. Tobacco use   PLAN/DISCUSSION:   Volume status stable. INR less than 3. Plan RHC today to further evaluate hemodynamics.     LOS: 2 days    CLEGG,AMY, NP 07/08/2011, 9:47 AM  Patient seen and examined with Tonye Becket, NP. We discussed all aspects of the encounter. I agree with the assessment and plan as stated above. Co-ox and lactate are ok. RHC today.   Hipolito Martinezlopez,MD 3:40 PM

## 2011-07-08 NOTE — Interval H&P Note (Signed)
History and Physical Interval Note:  07/08/2011 3:39 PM  [name]  has presented today for surgery, with the diagnosis of heart failure.   The various methods of treatment have been discussed with the patient and family. After consideration of risks, benefits and other options for treatment, the patient has consented to  Procedure(s): RIGHT HEART CATH as a surgical intervention .  The patients' history has been reviewed, patient examined, no change in status, stable for surgery.  I have reviewed the patients' chart and labs.  Questions were answered to the patient's satisfaction.     [name]

## 2011-07-08 NOTE — Progress Notes (Addendum)
ANTICOAGULATION CONSULT NOTE - Follow Up Consult  Pharmacy Consult for Coumadin Indication: LV Thrombus  No Known Allergies  Admit Complaint: 51 yoM w/ hx severe systolic HF due to NICM (LVEF 29% by echo from 11/16/2010), admitted 01/12 with several week h/o of chest and ab pain with n/v suspected due to low output.  Feeling better, for RHC today.  Will resume warfarin.  Home dose 5 mg daily  Pharmacist System-Based Medication Review: Anticoagulation: LV thrombus on Coumadin PTA admitted with 1/12 with  supratherapeutic INR 4.8. RHC planned for when INR < 3.  INR 2.12 today.  Cardiovascular: A/C NIHF -NICM LVEF 10%, quit smoking 12 days ago. NSVT s/p ICD. Previously on heart transplant list but taken off when didn't respond to page.  CEs neg x 3. BNP >5K. Dig level < 0.3. Pt states he is not taking dig anymore even though on med list. Run of VT last night. On Coreg 12.5 BID, dig 0.125 daily, hydralazine 75 TID, IMDUR 30 BID, lisinopril 10 BID, K 40 BID, spironolactone 25 daily, torsemide 20 bid.  BP on low side at 77/48 Gastrointestinal / Nutrition: Cardiac diet.  Neurology: Chronic back pain Nephrology: K 3.8, SCr 1.29 (down).  PTA Medication Issues: On home regimen Best Practices: INR 2.1 DVT prophylaxsis  Assessment: 51 yo M with LV Thrombus on warfarin, currently on hold pending plan for RHC, INR remains therapeutic.  Goal of Therapy:  INR 2-3   Plan:  1. Warfarin 5 mg PO x 1 tonight 2. Daily INR 3. Close INR follow up at DC given supertherapeutic INR at discharge.  Patient Measurements: Height: 5\' 5"  (165.1 cm) Weight: 150 lb 5.7 oz (68.2 kg) IBW/kg (Calculated) : 61.5   Vital Signs: Temp: 98.4 F (36.9 C) (01/14 0746) Temp src: Oral (01/14 0746) BP: 77/48 mmHg (01/14 0800) Pulse Rate: 86  (01/14 0800)  Labs:  Basename 07/08/11 0252 07/07/11 1620 07/07/11 0903 07/06/11 2352 07/06/11 1828  HGB -- -- -- 14.0 --  HCT -- -- -- 40.4 --  PLT -- -- -- 256 --  APTT -- -- --  68* 77*  LABPROT 24.1* -- -- 41.1* 46.2*  INR 2.12* -- -- 4.20* 4.88*  HEPARINUNFRC -- -- -- -- --  CREATININE 1.29 -- 1.47* 1.38* --  CKTOTAL -- 235* 251* 262* --  CKMB -- 1.6 1.7 1.7 --  TROPONINI -- <0.30 <0.30 <0.30 --   Prescriptions prior to admission  Medication Sig Dispense Refill  . carvedilol (COREG) 12.5 MG tablet Take 12.5 mg by mouth 2 (two) times daily with a meal.        . hydrALAZINE (APRESOLINE) 50 MG tablet Take 75 mg by mouth 3 (three) times daily.        Marland Kitchen lisinopril (PRINIVIL,ZESTRIL) 10 MG tablet Take 1 tablet (10 mg total) by mouth 2 (two) times daily.  60 tablet  6  . spironolactone (ALDACTONE) 25 MG tablet Take 1 tablet (25 mg total) by mouth daily.  30 tablet  6  . torsemide (DEMADEX) 20 MG tablet Take 20 mg by mouth 2 (two) times daily.       Marland Kitchen warfarin (COUMADIN) 5 MG tablet Take 1 tablet (5 mg total) by mouth daily.  35 tablet  1  . digoxin (LANOXIN) 0.125 MG tablet Take 125 mcg by mouth daily.        . isosorbide mononitrate (IMDUR) 30 MG 24 hr tablet TAKE 1 TABLET BY MOUTH TWO TIMES A DAY  60 tablet  10  Estimated Creatinine Clearance: 60.9 ml/min (by C-G formula based on Cr of 1.29).      Lovenia Kim Pharm.D., BCPS 07/08/2011 9:52 AM [phone]

## 2011-07-08 NOTE — Progress Notes (Signed)
Patient having runs of VT - 3 runs since 1900, longest being 23 beats. Patient is asymptomatic, states he feels fine. BP 70-80's/40-50's. Patient states this is normal for him. HR otherwise 90-110.  Sats >93% on 2L. Patient appears comfortable, pleasant, cooperative, no signs/symptoms. Dr Mayford Knife notified. AM Labs ordered for now along with the addition of magnesium. Will con't to monitor closely.

## 2011-07-09 ENCOUNTER — Telehealth (HOSPITAL_COMMUNITY): Payer: Self-pay | Admitting: *Deleted

## 2011-07-09 ENCOUNTER — Encounter (HOSPITAL_COMMUNITY): Payer: Self-pay

## 2011-07-09 DIAGNOSIS — I749 Embolism and thrombosis of unspecified artery: Secondary | ICD-10-CM

## 2011-07-09 LAB — BASIC METABOLIC PANEL
BUN: 19 mg/dL (ref 6–23)
CO2: 29 mEq/L (ref 19–32)
Chloride: 95 mEq/L — ABNORMAL LOW (ref 96–112)
Creatinine, Ser: 1.3 mg/dL (ref 0.50–1.35)
Glucose, Bld: 133 mg/dL — ABNORMAL HIGH (ref 70–99)

## 2011-07-09 MED ORDER — WARFARIN SODIUM 5 MG PO TABS
5.0000 mg | ORAL_TABLET | Freq: Once | ORAL | Status: AC
  Administered 2011-07-09: 5 mg via ORAL
  Filled 2011-07-09: qty 1

## 2011-07-09 MED ORDER — CARVEDILOL 6.25 MG PO TABS
6.2500 mg | ORAL_TABLET | Freq: Two times a day (BID) | ORAL | Status: DC
  Administered 2011-07-09 – 2011-07-10 (×4): 6.25 mg via ORAL
  Filled 2011-07-09 (×7): qty 1

## 2011-07-09 MED ORDER — HYDRALAZINE HCL 50 MG PO TABS
50.0000 mg | ORAL_TABLET | Freq: Three times a day (TID) | ORAL | Status: DC
  Administered 2011-07-09 – 2011-07-10 (×5): 50 mg via ORAL
  Filled 2011-07-09 (×9): qty 1

## 2011-07-09 NOTE — Consult Note (Signed)
Pt has been quit since 06/27/2011 and says he's been tobacco free since. Congratulated and encouraged pt to remain tobacco free. Discussed relapse prevention strategies. Referred to 1-800 quit now for f/u and support. Discussed oral fixation substitutes, second hand smoke and in home smoking policy. Reviewed and gave pt Written education/contact information.

## 2011-07-09 NOTE — Progress Notes (Signed)
ANTICOAGULATION CONSULT NOTE - Follow Up Consult  Pharmacy Consult for Coumadin Indication: LV Thrombus  No Known Allergies  Admit Complaint: 50 yoM w/ hx severe systolic HF due to NICM (LVEF 16% by echo from 11/16/2010), admitted 01/12 with several week h/o of chest and ab pain with n/v suspected due to low output. Home dose 5 mg daily  Pharmacist System-Based Medication Review: Anticoagulation: LV thrombus on Coumadin PTA admitted with 1/12 with  supratherapeutic INR 4.8. INR below goal, will repeat home dose of 5 mg Cardiovascular: A/C NIHF -NICM LVEF 10%, quit smoking 12 days ago. NSVT s/p ICD. Previously on heart transplant list but taken off when didn't respond to page.  CEs neg x 3. BNP >5K. Dig level < 0.3. Pt states he is not taking dig anymore even though on med list. Run of NSVT last night. On Coreg 12.5 BID, dig 0.125 daily, hydralazine 75 TID, IMDUR 30 BID, lisinopril 10 BID, K 40 BID, spironolactone 25 daily, torsemide 20 bid.  BP on low side despite milrinione started post cath, cutting back doses of BB and hydralazine Gastrointestinal / Nutrition: Cardiac diet.  Neurology: Chronic back pain, wanted nicotine patch Nephrology: K stable, SCr stable  PTA Medication Issues: On home regimen Best Practices: warfarin DVT prophylaxsis  Assessment: 51 yo M with LV Thrombus on warfarin, currently on hold pending plan for RHC, INR remains therapeutic.  Goal of Therapy:  INR 2-3   Plan:  1. Warfarin 5 mg PO x 1 tonight 2. Daily INR 3. Close INR follow up at DC given supertherapeutic INR at admission  Patient Measurements: Height: 5\' 5"  (165.1 cm) Weight: 143 lb 15.4 oz (65.3 kg) IBW/kg (Calculated) : 61.5   Vital Signs: Temp: 98.3 F (36.8 C) (01/15 0413) Temp src: Oral (01/15 0413) BP: 83/50 mmHg (01/15 0800) Pulse Rate: 93  (01/15 0800)  Labs:  Basename 07/09/11 0455 07/08/11 0252 07/07/11 1620 07/07/11 0903 07/06/11 2352 07/06/11 1828  HGB -- -- -- -- 14.0 --  HCT --  -- -- -- 40.4 --  PLT -- -- -- -- 256 --  APTT -- -- -- -- 68* 77*  LABPROT 16.6* 24.1* -- -- 41.1* --  INR 1.32 2.12* -- -- 4.20* --  HEPARINUNFRC -- -- -- -- -- --  CREATININE 1.30 1.29 -- 1.47* -- --  CKTOTAL -- -- 235* 251* 262* --  CKMB -- -- 1.6 1.7 1.7 --  TROPONINI -- -- <0.30 <0.30 <0.30 --   Prescriptions prior to admission  Medication Sig Dispense Refill  . carvedilol (COREG) 12.5 MG tablet Take 12.5 mg by mouth 2 (two) times daily with a meal.        . hydrALAZINE (APRESOLINE) 50 MG tablet Take 75 mg by mouth 3 (three) times daily.        Marland Kitchen lisinopril (PRINIVIL,ZESTRIL) 10 MG tablet Take 1 tablet (10 mg total) by mouth 2 (two) times daily.  60 tablet  6  . spironolactone (ALDACTONE) 25 MG tablet Take 1 tablet (25 mg total) by mouth daily.  30 tablet  6  . torsemide (DEMADEX) 20 MG tablet Take 20 mg by mouth 2 (two) times daily.       Marland Kitchen warfarin (COUMADIN) 5 MG tablet Take 1 tablet (5 mg total) by mouth daily.  35 tablet  1  . digoxin (LANOXIN) 0.125 MG tablet Take 125 mcg by mouth daily.        . isosorbide mononitrate (IMDUR) 30 MG 24 hr tablet TAKE 1 TABLET  BY MOUTH TWO TIMES A DAY  60 tablet  10     Estimated Creatinine Clearance: 60.4 ml/min (by C-G formula based on Cr of 1.3).      Lovenia Kim Pharm.D., BCPS 07/08/2011 9:52 AM [phone]

## 2011-07-09 NOTE — Progress Notes (Signed)
Pt hypotensive but asymptomatic.  MD on call notified.  75mg  Hydralazine due at 2200 held.

## 2011-07-09 NOTE — Telephone Encounter (Signed)
Error

## 2011-07-09 NOTE — Progress Notes (Addendum)
Advanced Heart Failure  Subjective:  51 y/o male with a h/o of severe systolic HF due to NICM LVEF 10% by echo from 11/16/2010, admitted  with several week h/o of chest and abdominal  pain with n/v suspected due to low output. Pro-BNP 5088 up, but weight down due to poor appetite. He presented with NYHA IIIb symptoms. Chronic coumadin to LV thrombus. +tobacco use  Underwent RHC 1/14 which showed low output physiology. Milrinone initiated.  RA = 7  RV = 45/6/7  PA = 45/31 (35)  PCW = 20  Fick cardiac output/index = 3.06/1.75  PVR = 5.2 Woods  FA sat = 96%  PA sat = 58%, 56%  Feels a bit better this am. Ab pain resolved. Appetite back. No CP or SOB. BP quite low last night so hydralazine held. Also having runs of NSVT.   Intake/Output Summary (Last 24 hours) at 07/09/11 4540 Last data filed at 07/09/11 0500  Gross per 24 hour  Intake 1529.3 ml  Output   2050 ml  Net -520.7 ml    Current meds:    . aspirin  324 mg Oral Pre-Cath  . carvedilol  12.5 mg Oral BID WC  . diazepam  5 mg Oral On Call  . digoxin  125 mcg Oral Daily  . fentaNYL      . heparin      . hydrALAZINE  75 mg Oral TID  . isosorbide mononitrate  30 mg Oral BID  . lidocaine      . lisinopril  10 mg Oral BID  . magnesium oxide  400 mg Oral BID  . midazolam      . nicotine  21 mg Transdermal Daily  . pneumococcal 23 valent vaccine  0.5 mL Intramuscular Tomorrow-1000  . potassium chloride  40 mEq Oral BID  . sodium chloride  3 mL Intravenous Q12H  . sodium chloride  3 mL Intravenous Q12H  . spironolactone  25 mg Oral Daily  . torsemide  20 mg Oral BID  . warfarin  5 mg Oral ONCE-1800  . DISCONTD: sodium chloride  3 mL Intravenous Q12H   Infusions:    . milrinone 0.25 mcg/kg/min (07/08/11 1735)     Objective:  Blood pressure 86/49, pulse 88, temperature 98.3 F (36.8 C), temperature source Oral, resp. rate 16, height 5\' 5"  (1.651 m), weight 65.3 kg (143 lb 15.4 oz), SpO2 92.00%. Weight change: -2.9  kg (-6 lb 6.3 oz)  Physical Exam: General:  Well appearing. No resp difficulty HEENT: normal Neck: supple. JVP hard to see . Carotids 2+ bilat; no bruits. No lymphadenopathy or thryomegaly appreciated. Cor: PMI nondisplaced. Regular rate & rhythm. S 3 rubs, gallops or murmurs. Lungs: clear Abdomen: soft, nontender, nondistended. No hepatosplenomegaly. No bruits or masses. Good bowel sounds. Extremities: no cyanosis, clubbing, rash, edema Neuro: alert & orientedx3, cranial nerves grossly intact. moves all 4 extremities w/o difficulty. Affect pleasant  Telemetry: SR NSVT  Lab Results: Basic Metabolic Panel:  Lab 07/09/11 9811 07/08/11 0252 07/07/11 0903 07/06/11 2352 07/06/11 1753  NA 136 137 140 138 138  K 3.8 3.8 -- -- --  CL 95* 98 100 99 101  CO2 29 29 28 25 25   GLUCOSE 133* 100* 142* 96 117*  BUN 19 22 21 20 20   CREATININE 1.30 1.29 1.47* 1.38* 1.32  CALCIUM 9.5 9.1 9.1 8.7 8.2*  MG -- 1.5 -- 1.6 --  PHOS -- -- -- 2.8 --   Liver Function Tests:  Lab 07/06/11  2352 07/06/11 1753  AST 31 34  ALT 25 26  ALKPHOS 96 100  BILITOT 0.6 0.6  PROT 6.9 6.4  ALBUMIN 3.5 3.3*   No results found for this basename: LIPASE:5,AMYLASE:5 in the last 168 hours No results found for this basename: AMMONIA:5 in the last 168 hours CBC:  Lab 07/06/11 2352  WBC 5.3  NEUTROABS 3.0  HGB 14.0  HCT 40.4  MCV 88.4  PLT 256   Cardiac Enzymes:  Lab 07/07/11 1620 07/07/11 0903 07/06/11 2352 07/06/11 1744  CKTOTAL 235* 251* 262* 273*  CKMB 1.6 1.7 1.7 1.5  CKMBINDEX -- -- -- --  TROPONINI <0.30 <0.30 <0.30 <0.30   BNP: No components found with this basename: POCBNP:5 CBG: No results found for this basename: GLUCAP:5 in the last 168 hours Microbiology: Lab Results  Component Value Date   CULT NO GROWTH 07/07/2011   CULT        BLOOD CULTURE RECEIVED NO GROWTH TO DATE CULTURE WILL BE HELD FOR 5 DAYS BEFORE ISSUING A FINAL NEGATIVE REPORT 07/07/2011   CULT        BLOOD CULTURE RECEIVED  NO GROWTH TO DATE CULTURE WILL BE HELD FOR 5 DAYS BEFORE ISSUING A FINAL NEGATIVE REPORT 07/07/2011   CULT NO GROWTH 5 DAYS 04/15/2008    Lab 07/07/11 1734 07/07/11 1615  CULT NO GROWTH       BLOOD CULTURE RECEIVED NO GROWTH TO DATE CULTURE WILL BE HELD FOR 5 DAYS BEFORE ISSUING A FINAL NEGATIVE REPORT  SDES URINE, CLEAN CATCH BLOOD RIGHT HAND    Imaging: No results found.   ASSESSMENT:  1. Acute on Chronic no-ischemic Systolic Heart Failure, NYHA class 3, LVEF 10% by echo (suspect low output)  2. Mitral regurgitation-moderate to severe  3. Left ventricular thrombus  4. Supra-therapeutic on Coumadin  5. NSVT s/p ICD  6. Hypokalemia  7. Ab pain - suspect due to #1  8. Tobacco use   PLAN/DISCUSSION:  Symptomatically improved but still quite tenuous. Will treat with a few days of milrinone and then try to wean. BPs running low even for him so will need to cut his b-blocker and hydralazine back. Volume status is ok. Watch closely.  Salisha Bardsley,MD 7:02 AM

## 2011-07-10 DIAGNOSIS — R570 Cardiogenic shock: Secondary | ICD-10-CM

## 2011-07-10 LAB — BASIC METABOLIC PANEL
BUN: 15 mg/dL (ref 6–23)
Calcium: 8.7 mg/dL (ref 8.4–10.5)
GFR calc Af Amer: >90 mL/min (ref >90)
GFR calc non Af Amer: 81 mL/min — ABNORMAL LOW (ref >90)
Potassium: 4.4 mEq/L (ref 3.5–5.1)
Sodium: 133 mEq/L — ABNORMAL LOW (ref 135–145)

## 2011-07-10 LAB — CARBOXYHEMOGLOBIN
Carboxyhemoglobin: 1.3 % (ref 0.5–1.5)
Methemoglobin: 1.3 % (ref 0.0–1.5)
Total hemoglobin: 13.6 g/dL (ref 13.5–18.0)
Total hemoglobin: 14.1 g/dL (ref 13.5–18.0)

## 2011-07-10 MED ORDER — WARFARIN SODIUM 7.5 MG PO TABS
7.5000 mg | ORAL_TABLET | Freq: Once | ORAL | Status: AC
  Administered 2011-07-10: 7.5 mg via ORAL
  Filled 2011-07-10: qty 1

## 2011-07-10 MED ORDER — LISINOPRIL 5 MG PO TABS
5.0000 mg | ORAL_TABLET | Freq: Two times a day (BID) | ORAL | Status: DC
  Administered 2011-07-10 – 2011-07-16 (×13): 5 mg via ORAL
  Filled 2011-07-10 (×14): qty 1

## 2011-07-10 NOTE — Progress Notes (Signed)
Advanced Heart Failure  Subjective:  51 y/o male with a h/o of severe systolic HF due to NICM LVEF 10% by echo from 11/16/2010, admitted  with several week h/o of chest and abdominal  pain with n/v suspected due to low output. Pro-BNP 5088 up, but weight down due to poor appetite. He presented with NYHA IIIb symptoms. Chronic coumadin to LV thrombus. +tobacco use  Underwent RHC 1/14 which showed low output physiology. Milrinone initiated. Coreg and hyrdralazine cut back.   RA = 7  RV = 45/6/7  PA = 45/31 (35)  PCW = 20  Fick cardiac output/index = 3.06/1.75  PVR = 5.2 Woods  FA sat = 96%  PA sat = 58%, 56%  "I feel 10x better this morning" . Ab pain resolved. Appetite back. No CP or SOB. BPs stable in 80s No PND or dizziness .  Intake/Output Summary (Last 24 hours) at 07/10/11 0548 Last data filed at 07/10/11 0500  Gross per 24 hour  Intake  781.4 ml  Output    500 ml  Net  281.4 ml    Current meds:    . carvedilol  6.25 mg Oral BID WC  . digoxin  125 mcg Oral Daily  . hydrALAZINE  50 mg Oral TID  . isosorbide mononitrate  30 mg Oral BID  . lisinopril  10 mg Oral BID  . magnesium oxide  400 mg Oral BID  . nicotine  21 mg Transdermal Daily  . potassium chloride  40 mEq Oral BID  . sodium chloride  3 mL Intravenous Q12H  . sodium chloride  3 mL Intravenous Q12H  . spironolactone  25 mg Oral Daily  . torsemide  20 mg Oral BID  . warfarin  5 mg Oral ONCE-1800  . DISCONTD: carvedilol  12.5 mg Oral BID WC  . DISCONTD: hydrALAZINE  75 mg Oral TID   Infusions:    . milrinone 0.25 mcg/kg/min (07/10/11 0436)     Objective:  Blood pressure 87/60, pulse 89, temperature 98.3 F (36.8 C), temperature source Oral, resp. rate 18, height 5\' 5"  (1.651 m), weight 66.3 kg (146 lb 2.6 oz), SpO2 93.00%. Weight change: 1 kg (2 lb 3.3 oz)  Physical Exam: General:  Well appearing. No resp difficulty HEENT: normal Neck: supple. JVP hard to see . Carotids 2+ bilat; no bruits. No  lymphadenopathy or thryomegaly appreciated. Cor: PMI nondisplaced. Regular rate & rhythm. S 3 rubs, gallops or murmurs. Lungs: clear Abdomen: soft, nontender, nondistended. No hepatosplenomegaly. No bruits or masses. Good bowel sounds. Extremities: no cyanosis, clubbing, rash, edema Neuro: alert & orientedx3, cranial nerves grossly intact. moves all 4 extremities w/o difficulty. Affect pleasant  Telemetry: SR NSVT  Lab Results: Basic Metabolic Panel:  Lab 07/10/11 1610 07/09/11 0455 07/08/11 0252 07/07/11 0903 07/06/11 2352  NA 133* 136 137 140 138  K 4.4 3.8 -- -- --  CL 96 95* 98 100 99  CO2 29 29 29 28 25   GLUCOSE 104* 133* 100* 142* 96  BUN 15 19 22 21 20   CREATININE 1.06 1.30 1.29 1.47* 1.38*  CALCIUM 8.7 9.5 9.1 9.1 8.7  MG -- -- 1.5 -- 1.6  PHOS -- -- -- -- 2.8   Liver Function Tests:  Lab 07/06/11 2352 07/06/11 1753  AST 31 34  ALT 25 26  ALKPHOS 96 100  BILITOT 0.6 0.6  PROT 6.9 6.4  ALBUMIN 3.5 3.3*   No results found for this basename: LIPASE:5,AMYLASE:5 in the last 168 hours No  results found for this basename: AMMONIA:5 in the last 168 hours CBC:  Lab 07/06/11 2352  WBC 5.3  NEUTROABS 3.0  HGB 14.0  HCT 40.4  MCV 88.4  PLT 256   Cardiac Enzymes:  Lab 07/07/11 1620 07/07/11 0903 07/06/11 2352 07/06/11 1744  CKTOTAL 235* 251* 262* 273*  CKMB 1.6 1.7 1.7 1.5  CKMBINDEX -- -- -- --  TROPONINI <0.30 <0.30 <0.30 <0.30   BNP: No components found with this basename: POCBNP:5 CBG: No results found for this basename: GLUCAP:5 in the last 168 hours Microbiology: Lab Results  Component Value Date   CULT NO GROWTH 07/07/2011   CULT        BLOOD CULTURE RECEIVED NO GROWTH TO DATE CULTURE WILL BE HELD FOR 5 DAYS BEFORE ISSUING A FINAL NEGATIVE REPORT 07/07/2011   CULT        BLOOD CULTURE RECEIVED NO GROWTH TO DATE CULTURE WILL BE HELD FOR 5 DAYS BEFORE ISSUING A FINAL NEGATIVE REPORT 07/07/2011   CULT NO GROWTH 5 DAYS 04/15/2008    Lab 07/07/11 1734  07/07/11 1615  CULT NO GROWTH       BLOOD CULTURE RECEIVED NO GROWTH TO DATE CULTURE WILL BE HELD FOR 5 DAYS BEFORE ISSUING A FINAL NEGATIVE REPORT  SDES URINE, CLEAN CATCH BLOOD RIGHT HAND    Imaging: No results found.   ASSESSMENT:  1. Cardiogenic shock due to Acute on Chronic non-ischemic Systolic Heart Failure, , LVEF 10% by echo (suspect low output)  2. Mitral regurgitation-moderate to severe  3. Left ventricular thrombus  4. Supra-therapeutic on Coumadin  5. NSVT s/p ICD  6. Hypokalemia  7. Ab pain - suspect due to #1  8. Tobacco use   PLAN/DISCUSSION:  Responding well to milrinone. Will continue for probably another 48 hours and then wean. May need to go back on home milrinone. VAD still may be an option depending on compliance. Resume coumadin.  Daniel Bensimhon,MD 5:48 AM

## 2011-07-10 NOTE — Progress Notes (Signed)
ANTICOAGULATION CONSULT NOTE - Follow Up Consult  Pharmacy Consult for Coumadin Indication: LV Thrombus  No Known Allergies  Admit Complaint: 51 yoM w/ hx severe systolic HF due to NICM (LVEF 16% by echo from 11/16/2010), admitted 01/12 with several week h/o of chest and ab pain with n/v suspected due to low output. Home dose 5 mg daily  Pharmacist System-Based Medication Review: Anticoagulation: LV thrombus on Coumadin PTA admitted with 1/12 with  supratherapeutic INR 4.8. INR remains below goal, will give 1.5x home dose since INR slow to rise Cardiovascular: A/C NIHF -NICM LVEF 10%, quit smoking 12 days ago. NSVT s/p ICD. Previously on heart transplant list but taken off when didn't respond to page.  CEs neg x 3. BNP >5K. Dig level < 0.3. Pt states he is not taking dig anymore even though on med list. Run of NSVT last night. On Coreg 12.5 BID, dig 0.125 daily, hydralazine 75 TID, IMDUR 30 BID, lisinopril 10 BID, K 40 BID, spironolactone 25 daily, torsemide 20 bid.  BP on low side despite milrinione started post cath, cutting back doses of BB and hydralazine, plan for 49h more milrinone. Gastrointestinal / Nutrition: Cardiac diet.  Neurology: Chronic back pain, wanted nicotine patch Nephrology: K stable, SCr stable  PTA Medication Issues: On home regimen Best Practices: warfarin DVT prophylaxsis  Assessment: 51 yo M with LV Thrombus on warfarin, currently resuming warfarin, INR slow to rise  Goal of Therapy:  INR 2-3   Plan:  1. Warfarin 7.5 mg PO x 1 tonight 2. Daily INR 3. Close INR follow up at DC given supertherapeutic INR at admission  Patient Measurements: Height: 5\' 5"  (165.1 cm) Weight: 146 lb 2.6 oz (66.3 kg) IBW/kg (Calculated) : 61.5   Vital Signs: Temp: 98.5 F (36.9 C) (01/16 0813) Temp src: Oral (01/16 0813) BP: 88/55 mmHg (01/16 0800) Pulse Rate: 89  (01/16 0800)  Labs:  Basename 07/10/11 0445 07/09/11 0455 07/08/11 0252 07/07/11 1620  HGB -- -- -- --  HCT  -- -- -- --  PLT -- -- -- --  APTT -- -- -- --  LABPROT 18.3* 16.6* 24.1* --  INR 1.49 1.32 2.12* --  HEPARINUNFRC -- -- -- --  CREATININE 1.06 1.30 1.29 --  CKTOTAL -- -- -- 235*  CKMB -- -- -- 1.6  TROPONINI -- -- -- <0.30   Prescriptions prior to admission  Medication Sig Dispense Refill  . carvedilol (COREG) 12.5 MG tablet Take 12.5 mg by mouth 2 (two) times daily with a meal.        . hydrALAZINE (APRESOLINE) 50 MG tablet Take 75 mg by mouth 3 (three) times daily.        Marland Kitchen lisinopril (PRINIVIL,ZESTRIL) 10 MG tablet Take 1 tablet (10 mg total) by mouth 2 (two) times daily.  60 tablet  6  . spironolactone (ALDACTONE) 25 MG tablet Take 1 tablet (25 mg total) by mouth daily.  30 tablet  6  . torsemide (DEMADEX) 20 MG tablet Take 20 mg by mouth 2 (two) times daily.       Marland Kitchen warfarin (COUMADIN) 5 MG tablet Take 1 tablet (5 mg total) by mouth daily.  35 tablet  1  . digoxin (LANOXIN) 0.125 MG tablet Take 125 mcg by mouth daily.        . isosorbide mononitrate (IMDUR) 30 MG 24 hr tablet TAKE 1 TABLET BY MOUTH TWO TIMES A DAY  60 tablet  10     Estimated Creatinine Clearance: 74.1 ml/min (  by C-G formula based on Cr of 1.06).      Lovenia Kim Pharm.D., BCPS 07/08/2011 9:52 AM [phone]

## 2011-07-11 ENCOUNTER — Encounter (HOSPITAL_COMMUNITY): Payer: Self-pay

## 2011-07-11 LAB — BASIC METABOLIC PANEL
BUN: 10 mg/dL (ref 6–23)
CO2: 27 mEq/L (ref 19–32)
CO2: 24 mEq/L (ref 19–32)
Calcium: 9.3 mg/dL (ref 8.4–10.5)
Chloride: 98 mEq/L (ref 96–112)
Creatinine, Ser: 1.06 mg/dL (ref 0.50–1.35)
GFR calc Af Amer: 84 mL/min — ABNORMAL LOW (ref >90)
GFR calc non Af Amer: 72 mL/min — ABNORMAL LOW (ref >90)
Glucose, Bld: 170 mg/dL — ABNORMAL HIGH (ref 70–99)
Potassium: 4.8 mEq/L (ref 3.5–5.1)
Sodium: 132 mEq/L — ABNORMAL LOW (ref 135–145)

## 2011-07-11 LAB — CARBOXYHEMOGLOBIN: Carboxyhemoglobin: 1.2 % (ref 0.5–1.5)

## 2011-07-11 LAB — PROTIME-INR
INR: 1.41 (ref 0.00–1.49)
Prothrombin Time: 17.5 seconds — ABNORMAL HIGH (ref 11.6–15.2)

## 2011-07-11 MED ORDER — HYDRALAZINE HCL 25 MG PO TABS
25.0000 mg | ORAL_TABLET | Freq: Three times a day (TID) | ORAL | Status: DC
  Administered 2011-07-11 – 2011-07-16 (×16): 25 mg via ORAL
  Filled 2011-07-11 (×18): qty 1

## 2011-07-11 MED ORDER — CARVEDILOL 3.125 MG PO TABS
3.1250 mg | ORAL_TABLET | Freq: Two times a day (BID) | ORAL | Status: DC
  Administered 2011-07-11 – 2011-07-16 (×11): 3.125 mg via ORAL
  Filled 2011-07-11 (×13): qty 1

## 2011-07-11 MED ORDER — WARFARIN SODIUM 7.5 MG PO TABS
7.5000 mg | ORAL_TABLET | Freq: Once | ORAL | Status: AC
  Administered 2011-07-11: 7.5 mg via ORAL
  Filled 2011-07-11: qty 1

## 2011-07-11 NOTE — Progress Notes (Signed)
ANTICOAGULATION CONSULT NOTE - Follow Up Consult  Pharmacy Consult for  Coumadin Indication:  LV thrombus  No Known Allergies  Patient Measurements: Height: 5\' 5"  (165.1 cm) Weight: 144 lb 2.9 oz (65.4 kg) IBW/kg (Calculated) : 61.5   Vital Signs: Temp: 98.3 F (36.8 C) (01/17 1154) Temp src: Oral (01/17 1154) BP: 82/53 mmHg (01/17 1154) Pulse Rate: 86  (01/17 1200)  Labs:  Basename 07/11/11 0900 07/11/11 0355 07/10/11 0445 07/09/11 0455  HGB -- -- -- --  HCT -- -- -- --  PLT -- -- -- --  APTT -- -- -- --  LABPROT -- 17.5* 18.3* 16.6*  INR -- 1.41 1.49 1.32  HEPARINUNFRC -- -- -- --  CREATININE 1.06 1.17 1.06 --  CKTOTAL -- -- -- --  CKMB -- -- -- --  TROPONINI -- -- -- --   Estimated Creatinine Clearance: 74.1 ml/min (by C-G formula based on Cr of 1.06).  Assessment:    INR remains subtherapeutic.    Coumadin at home: 5 mg daily, but INR 4.20 on admit 1/12.    Vitamin K 2.5 mg PO given 1/13.    Coumadin resumed right-heart cath 1/14.    Has had 5, 5, then 7.5 mg.  Goal of Therapy:  INR 2-3   Plan:    Will repeat Coumadin 7.5 mg today.   Continue daily PT/INR.  Dennie Fetters Pager: [phone] 07/11/2011,2:27 PM

## 2011-07-11 NOTE — Progress Notes (Signed)
Advanced Heart Failure  Subjective:  51 y/o male with a h/o of severe systolic HF due to NICM LVEF 10% by echo from 11/16/2010, admitted  with several week h/o of chest and abdominal  pain with n/v suspected due to low output. Pro-BNP 5088 up, but weight down due to poor appetite. He presented with NYHA IIIb symptoms. Chronic coumadin to LV thrombus. +tobacco use  Underwent RHC 1/14 which showed low output physiology. Milrinone initiated. Coreg and hyrdralazine cut back.   RA = 7  RV = 45/6/7  PA = 45/31 (35)  PCW = 20  Fick cardiac output/index = 3.06/1.75  PVR = 5.2 Woods  FA sat = 96%  PA sat = 58%, 56%  Feels OK but BP continues to run in the 70-80 range. . Ab pain resolved. Appetite back. Ambulating room No CP or SOB. No PND or dizziness .   Intake/Output Summary (Last 24 hours) at 07/11/11 0725 Last data filed at 07/11/11 0600  Gross per 24 hour  Intake 1312.4 ml  Output   2275 ml  Net -962.6 ml    Current meds:    . carvedilol  6.25 mg Oral BID WC  . digoxin  125 mcg Oral Daily  . hydrALAZINE  50 mg Oral TID  . isosorbide mononitrate  30 mg Oral BID  . lisinopril  5 mg Oral BID  . magnesium oxide  400 mg Oral BID  . nicotine  21 mg Transdermal Daily  . potassium chloride  40 mEq Oral BID  . sodium chloride  3 mL Intravenous Q12H  . sodium chloride  3 mL Intravenous Q12H  . spironolactone  25 mg Oral Daily  . torsemide  20 mg Oral BID  . warfarin  7.5 mg Oral ONCE-1800   Infusions:    . milrinone 0.25 mcg/kg/min (07/10/11 2232)     Objective:  Blood pressure 80/54, pulse 91, temperature 98.7 F (37.1 C), temperature source Oral, resp. rate 15, height 5\' 5"  (1.651 m), weight 65.4 kg (144 lb 2.9 oz), SpO2 95.00%. Weight change: -0.9 kg (-1 lb 15.8 oz)  Physical Exam: General:  Well appearing. No resp difficulty HEENT: normal Neck: supple. JVP hard to see . Carotids 2+ bilat; no bruits. No lymphadenopathy or thryomegaly appreciated. Cor: PMI nondisplaced.  Regular rate & rhythm. S 3 rubs, gallops or murmurs. Lungs: clear Abdomen: soft, nontender, nondistended. No hepatosplenomegaly. No bruits or masses. Good bowel sounds. Extremities: no cyanosis, clubbing, rash, edema Neuro: alert & orientedx3, cranial nerves grossly intact. moves all 4 extremities w/o difficulty. Affect pleasant  Telemetry: SR NSVT  Lab Results: Basic Metabolic Panel:  Lab 07/11/11 1610 07/10/11 0445 07/09/11 0455 07/08/11 0252 07/07/11 0903 07/06/11 2352  NA 132* 133* 136 137 140 --  K 5.2* 4.4 -- -- -- --  CL 98 96 95* 98 100 --  CO2 27 29 29 29 28  --  GLUCOSE 104* 104* 133* 100* 142* --  BUN 11 15 19 22 21  --  CREATININE 1.17 1.06 1.30 1.29 1.47* --  CALCIUM 9.3 8.7 9.5 9.1 9.1 --  MG -- -- -- 1.5 -- 1.6  PHOS -- -- -- -- -- 2.8   Liver Function Tests:  Lab 07/06/11 2352 07/06/11 1753  AST 31 34  ALT 25 26  ALKPHOS 96 100  BILITOT 0.6 0.6  PROT 6.9 6.4  ALBUMIN 3.5 3.3*   No results found for this basename: LIPASE:5,AMYLASE:5 in the last 168 hours No results found for this basename: AMMONIA:5  in the last 168 hours CBC:  Lab 07/06/11 2352  WBC 5.3  NEUTROABS 3.0  HGB 14.0  HCT 40.4  MCV 88.4  PLT 256   Cardiac Enzymes:  Lab 07/07/11 1620 07/07/11 0903 07/06/11 2352 07/06/11 1744  CKTOTAL 235* 251* 262* 273*  CKMB 1.6 1.7 1.7 1.5  CKMBINDEX -- -- -- --  TROPONINI <0.30 <0.30 <0.30 <0.30   BNP: No components found with this basename: POCBNP:5 CBG: No results found for this basename: GLUCAP:5 in the last 168 hours Microbiology: Lab Results  Component Value Date   CULT NO GROWTH 07/07/2011   CULT        BLOOD CULTURE RECEIVED NO GROWTH TO DATE CULTURE WILL BE HELD FOR 5 DAYS BEFORE ISSUING A FINAL NEGATIVE REPORT 07/07/2011   CULT        BLOOD CULTURE RECEIVED NO GROWTH TO DATE CULTURE WILL BE HELD FOR 5 DAYS BEFORE ISSUING A FINAL NEGATIVE REPORT 07/07/2011   CULT NO GROWTH 5 DAYS 04/15/2008    Lab 07/07/11 1734 07/07/11 1615  CULT NO  GROWTH       BLOOD CULTURE RECEIVED NO GROWTH TO DATE CULTURE WILL BE HELD FOR 5 DAYS BEFORE ISSUING A FINAL NEGATIVE REPORT  SDES URINE, CLEAN CATCH BLOOD RIGHT HAND    Imaging: No results found.   ASSESSMENT:  1. Cardiogenic shock due to Acute on Chronic non-ischemic Systolic Heart Failure, , LVEF 10% by echo (suspect low output)  2. Mitral regurgitation-moderate to severe  3. Left ventricular thrombus  4. Supra-therapeutic on Coumadin  5. NSVT s/p ICD  6. Hypokalemia  7. Ab pain - suspect due to #1  8. Tobacco use 9. Hyperkalemia   PLAN/DISCUSSION:  Responding well to milrinone. Co-ox up. BP still quite low though. Will cut back more on carvedilol and hydralazine. Stop K. Continue milrinone for probably another 24- 48 hours and then wean. May need to go back on home milrinone. VAD still may be an option depending on compliance. Continue coumadin.  Lorre Opdahl,MD 7:25 AM

## 2011-07-11 NOTE — Progress Notes (Signed)
07/11/11 1630 UR Completed. Tera Mater, RN, BSN

## 2011-07-12 LAB — CBC
HCT: 44.4 % (ref 39.0–52.0)
Hemoglobin: 15.2 g/dL (ref 13.0–17.0)
MCH: 30.5 pg (ref 26.0–34.0)
MCV: 89 fL (ref 78.0–100.0)
RBC: 4.99 MIL/uL (ref 4.22–5.81)
WBC: 4.3 10*3/uL (ref 4.0–10.5)

## 2011-07-12 LAB — BASIC METABOLIC PANEL
BUN: 14 mg/dL (ref 6–23)
CO2: 30 mEq/L (ref 19–32)
Chloride: 97 mEq/L (ref 96–112)
Creatinine, Ser: 1.38 mg/dL — ABNORMAL HIGH (ref 0.50–1.35)
GFR calc Af Amer: 68 mL/min — ABNORMAL LOW (ref >90)
Potassium: 6 mEq/L — ABNORMAL HIGH (ref 3.5–5.1)

## 2011-07-12 MED ORDER — WARFARIN SODIUM 7.5 MG PO TABS
7.5000 mg | ORAL_TABLET | Freq: Once | ORAL | Status: AC
  Administered 2011-07-12: 7.5 mg via ORAL
  Filled 2011-07-12: qty 1

## 2011-07-12 NOTE — Progress Notes (Signed)
Subjective:  51 y/o male with a h/o of severe systolic HF due to NICM LVEF 10% by echo from 11/16/2010, admitted with several week h/o of chest and abdominal pain with n/v suspected due to low output. Pro-BNP 5088 up, but weight down due to poor appetite. He presented with NYHA IIIb symptoms. Chronic coumadin to LV thrombus. +tobacco use   Has previously been on transplant list (taken off due to failure to respond to page when heart available). Was also on home milrinone in past but symptoms imprtoved and he was weaned off.  Admitted with low output symptoms of fatigue, ab pain and poor appetite.   Underwent RHC 1/14 which showed low output physiology. Milrinone initiated. Coreg and hyrdralazine cut back.  RA = 7  RV = 45/6/7  PA = 45/31 (35)  PCW = 20  Fick cardiac output/index = 3.06/1.75  PVR = 5.2 Woods  FA sat = 96%  PA sat = 58%, 56%   Nursing staff reported that he ambulated to bathroom without Milrinone. He disconnected from [place] and later re-connected. Feels much better.   Denies SOB/PND/Orthopnea. Weight decreased 2 pounds. He continues on Milrinone 0.25 mcg/kg/min   Intake/Output Summary (Last 24 hours) at 07/12/11 0938 Last data filed at 07/12/11 0700  Gross per 24 hour  Intake  762.2 ml  Output    500 ml  Net  262.2 ml    Current meds:    . carvedilol  3.125 mg Oral BID WC  . digoxin  125 mcg Oral Daily  . hydrALAZINE  25 mg Oral TID  . isosorbide mononitrate  30 mg Oral BID  . lisinopril  5 mg Oral BID  . magnesium oxide  400 mg Oral BID  . nicotine  21 mg Transdermal Daily  . sodium chloride  3 mL Intravenous Q12H  . sodium chloride  3 mL Intravenous Q12H  . spironolactone  25 mg Oral Daily  . torsemide  20 mg Oral BID  . warfarin  7.5 mg Oral ONCE-1800   Infusions:    . milrinone 0.25 mcg/kg/min (07/12/11 0527)     Objective:  Blood pressure 91/58, pulse 90, temperature 98 F (36.7 C), temperature source Oral, resp. rate 16, height 5\' 5"   (1.651 m), weight 64.9 kg (143 lb 1.3 oz), SpO2 98.00%. Weight change: -0.5 kg (-1 lb 1.6 oz)  Physical Exam: General:  Well appearing. No resp difficulty HEENT: normal Neck: supple. JVP 5-6. Carotids 2+ bilat; no bruits. No lymphadenopathy or thryomegaly appreciated. Cor: PMI nondisplaced. Regular rate & rhythm. No rubs, gallops or murmurs. Lungs: clear Abdomen: soft, nontender, nondistended. No hepatosplenomegaly. No bruits or masses. Good bowel sounds. Extremities: no cyanosis, clubbing, rash, edema Neuro: alert & orientedx3, cranial nerves grossly intact. moves all 4 extremities w/o difficulty. Affect pleasant  Telemetry: Sinus Rhthm Lab Results: Basic Metabolic Panel:  Lab 07/12/11 1610 07/11/11 0900 07/11/11 0355 07/10/11 0445 07/09/11 0455 07/08/11 0252 07/06/11 2352  NA -- 132* 132* 133* 136 137 --  K -- 4.8 5.2* -- -- -- --  CL -- 98 98 96 95* 98 --  CO2 -- 24 27 29 29 29  --  GLUCOSE -- 170* 104* 104* 133* 100* --  BUN -- 10 11 15 19 22  --  CREATININE -- 1.06 1.17 1.06 1.30 1.29 --  CALCIUM -- 8.9 9.3 8.7 9.5 9.1 --  MG 2.3 -- -- -- -- 1.5 1.6  PHOS -- -- -- -- -- -- 2.8   Liver Function Tests:  Lab 07/06/11 2352 07/06/11 1753  AST 31 34  ALT 25 26  ALKPHOS 96 100  BILITOT 0.6 0.6  PROT 6.9 6.4  ALBUMIN 3.5 3.3*   No results found for this basename: LIPASE:5,AMYLASE:5 in the last 168 hours No results found for this basename: AMMONIA:5 in the last 168 hours CBC:  Lab 07/12/11 0400 07/06/11 2352  WBC 4.3 5.3  NEUTROABS -- 3.0  HGB 15.2 14.0  HCT 44.4 40.4  MCV 89.0 88.4  PLT 328 256   Cardiac Enzymes:  Lab 07/07/11 1620 07/07/11 0903 07/06/11 2352 07/06/11 1744  CKTOTAL 235* 251* 262* 273*  CKMB 1.6 1.7 1.7 1.5  CKMBINDEX -- -- -- --  TROPONINI <0.30 <0.30 <0.30 <0.30   BNP: No components found with this basename: POCBNP:5 CBG: No results found for this basename: GLUCAP:5 in the last 168 hours Microbiology: Lab Results  Component Value Date    CULT NO GROWTH 07/07/2011   CULT        BLOOD CULTURE RECEIVED NO GROWTH TO DATE CULTURE WILL BE HELD FOR 5 DAYS BEFORE ISSUING A FINAL NEGATIVE REPORT 07/07/2011   CULT        BLOOD CULTURE RECEIVED NO GROWTH TO DATE CULTURE WILL BE HELD FOR 5 DAYS BEFORE ISSUING A FINAL NEGATIVE REPORT 07/07/2011   CULT NO GROWTH 5 DAYS 04/15/2008    Lab 07/07/11 1734 07/07/11 1615  CULT NO GROWTH       BLOOD CULTURE RECEIVED NO GROWTH TO DATE CULTURE WILL BE HELD FOR 5 DAYS BEFORE ISSUING A FINAL NEGATIVE REPORT  SDES URINE, CLEAN CATCH BLOOD RIGHT HAND    Imaging: No results found.   ASSESSMENT:  1. Cardiogenic shock due to Acute on Chronic non-ischemic Systolic Heart Failure, , LVEF 10% by echo (suspect low output)  2. Mitral regurgitation-moderate to severe  3. Left ventricular thrombus  4. Supra-therapeutic on Coumadin  5. NSVT s/p ICD  6. Hypokalemia  7. Ab pain - suspect due to #1  8. Tobacco use  9. Hyperkalemia    PLAN/DISCUSSION: Symptomatically improved on milrinone. BP improved after many of his HF meds decreased. Will begin slow wean. Decrease to 0.125 mcg today. Continue through Sunday.   If feeling OK on Sunday would d/c milrinone. If develops recurrent symptoms will need repeat RHC and consideration of restarting home milrinone. (Still has Port-a-cath in place). BMET and CO-OX pending. Coumadin per pharmacy for LV thrombus.  Need to consider possible LVAD but compliance can be an issue at times.  I suspect he will need home milrinone again   LOS: 6 days    Arvilla Meres, MD 07/12/2011, 9:38 AM

## 2011-07-12 NOTE — Progress Notes (Signed)
   CARE MANAGEMENT NOTE HEART FAILURE  07/12/2011   Patient:  [name], [name]   Account Number:  [id number]    Date Initiated:  07/11/2011  Documentation initiated by:  Tera Mater  Subjective/Objective Assessment:   51yo male admitted with severe CHF.  HX:  LVEF 10%, CHF, HTN, MR, RI.  On Milrinone gtt.   Action/Plan:   Discharge planning for possible Milrinone gtt at home.   Anticipated DC Date:  07/14/2011  Anticipated DC Plan:  HOME W HOME HEALTH SERVICES  DC Planning Services:  CM consult    Choice offered to / List presented to:         [facility] agency:  [facility]    Status of service:  In process, will continue to follow  Medicare Important Message Given:   (If response is "NO", the following Medicare IM given date fields will be blank) Date Medicare IM Given:   Date Additional Medicare IM Given:    Discharge Disposition:    Per UR Regulation:    Comments:   07/12/11 1500 Pt. currently on Milrinone gtt at 0.25mg /kg/min.  Orders for slow titration this weekend with plans of d/c by Sunday.  Spoke with pt. about possiblity of Home Milrinone and pt. stated he used [facility] in past and was satisfied with their services.  Marie with [facility] is aware of possible discharge over weekend.  Pt. has support from cousin.  Will leave handoff for weekend CM for possiblity of home on Milrinone.  NCM will follow thru discharge. Tera Mater, RN, BSN   07/11/11 1630 UR Completed. Tera Mater, RN, BSN   Initial CM contact:     By:   Initial CSW contact:     By:      Is this an INP Readmission < 30 days:   (If "YES" please see readmission information at the bottom of note)  Patient living status prior to this admission:  ALONE  Patient setting prior to this admission:  HOME  Comorbid conditions being treated that contributed to this admission:  CHF, THN, MR, RI  CHF Readmission Risk:  high      Was referral made to [facility]:  N  Is  the patient's PCP the same as attending:  Y PCP:    Readmission < 30 Days If pt has HH, did they contact the agency before going to the ED:   Name of [facility] agency:    Was the follow-up physician visit scheduled prior to discharge:    Did the patient follow-up with the physician prior to this readmission:    Was there HF Clinic visits prior to readmission:    Were there ED visits between admissions:    Readmit type:    If unscheduled and related indicate reason for readmit:

## 2011-07-12 NOTE — Progress Notes (Signed)
ANTICOAGULATION CONSULT NOTE - Follow Up Consult  Pharmacy Consult for  Coumadin Indication:  LV thrombus  No Known Allergies  Patient Measurements: Height: 5\' 5"  (165.1 cm) Weight: 143 lb 1.3 oz (64.9 kg) IBW/kg (Calculated) : 61.5   Vital Signs: Temp: 97.8 F (36.6 C) (01/18 0830) Temp src: Oral (01/18 0830) BP: 91/69 mmHg (01/18 0835)  Labs:  Basename 07/12/11 0922 07/12/11 0400 07/11/11 0900 07/11/11 0355 07/10/11 0445  HGB -- 15.2 -- -- --  HCT -- 44.4 -- -- --  PLT -- 328 -- -- --  APTT -- -- -- -- --  LABPROT -- 17.9* -- 17.5* 18.3*  INR -- 1.45 -- 1.41 1.49  HEPARINUNFRC -- -- -- -- --  CREATININE 1.38* -- 1.06 1.17 --  CKTOTAL -- -- -- -- --  CKMB -- -- -- -- --  TROPONINI -- -- -- -- --   Estimated Creatinine Clearance: 56.9 ml/min (by C-G formula based on Cr of 1.38).  Assessment:    INR remains subtherapeutic.    Coumadin at home: 5 mg daily, but INR 4.20 on admit 1/12.    Vitamin K 2.5 mg PO given 1/13.    Coumadin resumed right-heart cath 1/14.    Has had 5, 5, 7.5, 7.5 mg over last 4 days; INR slow to rise.    Reluctant to increase further than 7.5 mg.  Goal of Therapy:  INR 2-3   Plan:    Will continue with Coumadin 7.5 mg for the 3rd day today.   Continue daily PT/INR.  Dennie Fetters Pager: [phone] 07/12/2011,11:03 AM

## 2011-07-13 LAB — PROTIME-INR: INR: 1.62 — ABNORMAL HIGH (ref 0.00–1.49)

## 2011-07-13 LAB — BASIC METABOLIC PANEL
BUN: 21 mg/dL (ref 6–23)
CO2: 25 mEq/L (ref 19–32)
GFR calc non Af Amer: 65 mL/min — ABNORMAL LOW (ref >90)
Glucose, Bld: 140 mg/dL — ABNORMAL HIGH (ref 70–99)
Potassium: 4.8 mEq/L (ref 3.5–5.1)

## 2011-07-13 MED ORDER — WARFARIN SODIUM 7.5 MG PO TABS
7.5000 mg | ORAL_TABLET | Freq: Once | ORAL | Status: AC
  Administered 2011-07-13: 7.5 mg via ORAL
  Filled 2011-07-13: qty 1

## 2011-07-13 MED ORDER — MAGNESIUM OXIDE 400 MG PO TABS
400.0000 mg | ORAL_TABLET | Freq: Every day | ORAL | Status: DC
  Administered 2011-07-14 – 2011-07-16 (×3): 400 mg via ORAL
  Filled 2011-07-13 (×3): qty 1

## 2011-07-13 MED ORDER — SODIUM CHLORIDE 0.9 % IV SOLN
INTRAVENOUS | Status: DC
  Administered 2011-07-13: 20:00:00 via INTRAVENOUS

## 2011-07-13 NOTE — Progress Notes (Signed)
Subjective:  51 y/o male with a h/o of severe systolic HF due to NICM LVEF 10% by echo from 11/16/2010, admitted with several week h/o of chest and abdominal pain with n/v suspected due to low output. Pro-BNP 5088 up, but weight down due to poor appetite. He presented with NYHA IIIb symptoms. Chronic coumadin to LV thrombus. +tobacco use   Has previously been on transplant list (taken off due to failure to respond to page when heart available). Was also on home milrinone in past but symptoms imprtoved and he was weaned off.  Admitted with low output symptoms of fatigue, ab pain and poor appetite.   Underwent RHC 1/14 which showed low output physiology. Milrinone initiated. Coreg and hyrdralazine cut back.  RA = 7  RV = 45/6/7  PA = 45/31 (35)  PCW = 20  Fick cardiac output/index = 3.06/1.75  PVR = 5.2 Woods  FA sat = 96%  PA sat = 58%, 56%   Milrinone weaned to 0.125 yesterday. Feels fine. Ambulating room. Denies SOB/PND/Orthopnea. SBP stable in 80-100 range. No dizziness. Ab pain and early satiety resolved. Weight stable.    Intake/Output Summary (Last 24 hours) at 07/13/11 0811 Last data filed at 07/13/11 0713  Gross per 24 hour  Intake 1339.8 ml  Output   2650 ml  Net -1310.2 ml    Current meds:    . carvedilol  3.125 mg Oral BID WC  . digoxin  125 mcg Oral Daily  . hydrALAZINE  25 mg Oral TID  . isosorbide mononitrate  30 mg Oral BID  . lisinopril  5 mg Oral BID  . magnesium oxide  400 mg Oral BID  . nicotine  21 mg Transdermal Daily  . sodium chloride  3 mL Intravenous Q12H  . sodium chloride  3 mL Intravenous Q12H  . torsemide  20 mg Oral BID  . warfarin  7.5 mg Oral ONCE-1800  . DISCONTD: spironolactone  25 mg Oral Daily   Infusions:    . milrinone 0.125 mcg/kg/min (07/12/11 0945)     Objective:  Blood pressure 84/36, pulse 87, temperature 97.9 F (36.6 C), temperature source Oral, resp. rate 18, height 5\' 5"  (1.651 m), weight 64.9 kg (143 lb 1.3 oz),  SpO2 97.00%. Weight change: 0 kg (0 lb)  Physical Exam: General:  Well appearing. No resp difficulty HEENT: normal Neck: supple. JVP 5-6. Carotids 2+ bilat; no bruits. No lymphadenopathy or thryomegaly appreciated. Cor: PMI nondisplaced. Regular rate & rhythm. No rubs, gallops or murmurs. Lungs: clear Abdomen: soft, nontender, nondistended. No hepatosplenomegaly. No bruits or masses. Good bowel sounds. Extremities: no cyanosis, clubbing, rash, edema Neuro: alert & orientedx3, cranial nerves grossly intact. moves all 4 extremities w/o difficulty. Affect pleasant  Telemetry: Sinus Rhthm Lab Results: Basic Metabolic Panel:  Lab 07/13/11 1191 07/12/11 1249 07/12/11 0922 07/12/11 0400 07/11/11 0900 07/11/11 0355 07/10/11 0445 07/08/11 0252 07/06/11 2352  NA 127* -- 134* -- 132* 132* 133* -- --  K 4.8 4.5 -- -- -- -- -- -- --  CL 91* -- 97 -- 98 98 96 -- --  CO2 25 -- 30 -- 24 27 29  -- --  GLUCOSE 140* -- 102* -- 170* 104* 104* -- --  BUN 21 -- 14 -- 10 11 15  -- --  CREATININE 1.27 -- 1.38* -- 1.06 1.17 1.06 -- --  CALCIUM 8.5 -- 9.5 -- 8.9 9.3 8.7 -- --  MG -- -- -- 2.3 -- -- -- 1.5 1.6  PHOS -- -- -- -- -- -- -- --  2.8   Liver Function Tests:  Lab 07/06/11 2352 07/06/11 1753  AST 31 34  ALT 25 26  ALKPHOS 96 100  BILITOT 0.6 0.6  PROT 6.9 6.4  ALBUMIN 3.5 3.3*   No results found for this basename: LIPASE:5,AMYLASE:5 in the last 168 hours No results found for this basename: AMMONIA:5 in the last 168 hours CBC:  Lab 07/12/11 0400 07/06/11 2352  WBC 4.3 5.3  NEUTROABS -- 3.0  HGB 15.2 14.0  HCT 44.4 40.4  MCV 89.0 88.4  PLT 328 256   Cardiac Enzymes:  Lab 07/07/11 1620 07/07/11 0903 07/06/11 2352 07/06/11 1744  CKTOTAL 235* 251* 262* 273*  CKMB 1.6 1.7 1.7 1.5  CKMBINDEX -- -- -- --  TROPONINI <0.30 <0.30 <0.30 <0.30   BNP: No components found with this basename: POCBNP:5 CBG: No results found for this basename: GLUCAP:5 in the last 168 hours Microbiology: Lab  Results  Component Value Date   CULT NO GROWTH 07/07/2011   CULT        BLOOD CULTURE RECEIVED NO GROWTH TO DATE CULTURE WILL BE HELD FOR 5 DAYS BEFORE ISSUING A FINAL NEGATIVE REPORT 07/07/2011   CULT        BLOOD CULTURE RECEIVED NO GROWTH TO DATE CULTURE WILL BE HELD FOR 5 DAYS BEFORE ISSUING A FINAL NEGATIVE REPORT 07/07/2011   CULT NO GROWTH 5 DAYS 04/15/2008    Lab 07/07/11 1734 07/07/11 1615  CULT NO GROWTH       BLOOD CULTURE RECEIVED NO GROWTH TO DATE CULTURE WILL BE HELD FOR 5 DAYS BEFORE ISSUING A FINAL NEGATIVE REPORT  SDES URINE, CLEAN CATCH BLOOD RIGHT HAND    Imaging: No results found.   ASSESSMENT:  1. Cardiogenic shock due to Acute on Chronic non-ischemic Systolic Heart Failure, , LVEF 10% by echo (suspect low output)  2. Mitral regurgitation-moderate to severe  3. Left ventricular thrombus  4. Supra-therapeutic on Coumadin  5. NSVT s/p ICD  6. Hypokalemia  7. Ab pain - suspect due to #1  8. Tobacco use  9. Hyperkalemia    PLAN/DISCUSSION: Symptomatically improved on milrinone. BP improved after many of his HF meds decreased. Milrinone wean in process.    If feeling OK tomorrow would d/c milrinone and watch for 24-48 hours. If develops recurrent symptoms will need repeat RHC and consideration of restarting home milrinone. (Still has Port-a-cath in place). Coumadin per pharmacy for LV thrombus.  Need to consider possible LVAD but compliance can be an issue at times.  I suspect he will need home milrinone again.  Will give him lab holiday for tomorrow.   LOS: 7 days    Arvilla Meres, MD 07/13/2011, 8:11 AM

## 2011-07-13 NOTE — Progress Notes (Signed)
ANTICOAGULATION CONSULT NOTE - Follow Up Consult  Pharmacy Consult for  Coumadin Indication:  LV thrombus  No Known Allergies  Patient Measurements: Height: 5\' 5"  (165.1 cm) Weight: 143 lb 1.3 oz (64.9 kg) (standing scale) IBW/kg (Calculated) : 61.5   Vital Signs: Temp: 97.9 F (36.6 C) (01/19 1205) Temp src: Oral (01/19 1205) BP: 96/64 mmHg (01/19 1205) Pulse Rate: 83  (01/19 1205)  Labs:  Basename 07/13/11 0400 07/12/11 0922 07/12/11 0400 07/11/11 0900 07/11/11 0355  HGB -- -- 15.2 -- --  HCT -- -- 44.4 -- --  PLT -- -- 328 -- --  APTT -- -- -- -- --  LABPROT 19.5* -- 17.9* -- 17.5*  INR 1.62* -- 1.45 -- 1.41  HEPARINUNFRC -- -- -- -- --  CREATININE 1.27 1.38* -- 1.06 --  CKTOTAL -- -- -- -- --  CKMB -- -- -- -- --  TROPONINI -- -- -- -- --   Estimated Creatinine Clearance: 61.9 ml/min (by C-G formula based on Cr of 1.27).  Assessment:    INR remains subtherapeutic.    Coumadin at home: 5 mg daily, but INR 4.20 on admit 1/12.    Vitamin K 2.5 mg PO given 1/13.    Coumadin resumed right-heart cath 1/14.    Has had 5, 5, 7.5, 7.5 mg over last 4 days; INR slow to rise.    Reluctant to increase further than 7.5 mg. INR today 1.62 slow to rise.  No bleeding noted, cbc stable.  Goal of Therapy:  INR 2-3   Plan:    Will continue with Coumadin 7.5 mg x1 today    Continue daily PT/INR.  Marcelino Scot Pager: [phone] 07/13/2011,3:13 PM

## 2011-07-14 LAB — PROTIME-INR: INR: 1.63 — ABNORMAL HIGH (ref 0.00–1.49)

## 2011-07-14 MED ORDER — WARFARIN SODIUM 10 MG PO TABS
10.0000 mg | ORAL_TABLET | Freq: Once | ORAL | Status: AC
  Administered 2011-07-14: 10 mg via ORAL
  Filled 2011-07-14: qty 1

## 2011-07-14 NOTE — Progress Notes (Signed)
ANTICOAGULATION CONSULT NOTE - Follow Up Consult  Pharmacy Consult for  Coumadin Indication:  LV thrombus  No Known Allergies  Patient Measurements: Height: 5\' 5"  (165.1 cm) Weight: 142 lb 13.7 oz (64.8 kg) IBW/kg (Calculated) : 61.5   Vital Signs: Temp: 98.1 F (36.7 C) (01/20 1203) Temp src: Oral (01/20 1203) BP: 95/65 mmHg (01/20 1203) Pulse Rate: 81  (01/20 1203)  Labs:  Basename 07/14/11 0527 07/13/11 0400 07/12/11 0922 07/12/11 0400  HGB -- -- -- 15.2  HCT -- -- -- 44.4  PLT -- -- -- 328  APTT -- -- -- --  LABPROT 19.6* 19.5* -- 17.9*  INR 1.63* 1.62* -- 1.45  HEPARINUNFRC -- -- -- --  CREATININE -- 1.27 1.38* --  CKTOTAL -- -- -- --  CKMB -- -- -- --  TROPONINI -- -- -- --   Estimated Creatinine Clearance: 61.9 ml/min (by C-G formula based on Cr of 1.27).  Assessment:    INR remains subtherapeutic.    Coumadin at home: 5 mg daily, but INR 4.20 on admit 1/12.    Vitamin K 2.5 mg PO given 1/13.    Coumadin resumed right-heart cath 1/14.    Has had 5, 5, 7.5, 7.5 mg over last 4 days; INR slow to rise. INR today 1.63 slow to rise.  No bleeding noted, cbc stable.  Goal of Therapy:  INR 2-3   Plan:    Coumadin 10mg  x1   Continue daily PT/INR.  Marcelino Scot Pager: [phone] 07/14/2011,2:54 PM

## 2011-07-14 NOTE — Progress Notes (Signed)
Subjective:  51 y/o male with a h/o of severe systolic HF due to NICM LVEF 10% by echo from 11/16/2010, admitted with several week h/o of chest and abdominal pain with n/v suspected due to low output. Pro-BNP 5088 up, but weight down due to poor appetite. He presented with NYHA IIIb symptoms. Chronic coumadin to LV thrombus. +tobacco use   Has previously been on transplant list (taken off due to failure to respond to page when heart available). Was also on home milrinone in past but symptoms imprtoved and he was weaned off.  Admitted with low output symptoms of fatigue, ab pain and poor appetite.   Underwent RHC 1/14 which showed low output physiology. Milrinone initiated. Coreg and hyrdralazine cut back.  RA = 7  RV = 45/6/7  PA = 45/31 (35)  PCW = 20  Fick cardiac output/index = 3.06/1.75  PVR = 5.2 Woods  FA sat = 96%  PA sat = 58%, 56%   Milrinone weaned to 0.125 on Friday. Feels fine. Ambulating room. Denies SOB/PND/Orthopnea. SBP stable in 80-90 range. No dizziness. Ab pain and early satiety resolved. Weight stable.     Intake/Output Summary (Last 24 hours) at 07/14/11 0841 Last data filed at 07/14/11 0700  Gross per 24 hour  Intake  387.2 ml  Output      0 ml  Net  387.2 ml    Current meds:    . carvedilol  3.125 mg Oral BID WC  . digoxin  125 mcg Oral Daily  . hydrALAZINE  25 mg Oral TID  . isosorbide mononitrate  30 mg Oral BID  . lisinopril  5 mg Oral BID  . magnesium oxide  400 mg Oral Daily  . nicotine  21 mg Transdermal Daily  . torsemide  20 mg Oral BID  . warfarin  7.5 mg Oral ONCE-1800  . DISCONTD: magnesium oxide  400 mg Oral BID  . DISCONTD: sodium chloride  3 mL Intravenous Q12H  . DISCONTD: sodium chloride  3 mL Intravenous Q12H   Infusions:    . sodium chloride 15 mL/hr at 07/13/11 1948  . milrinone 0.125 mcg/kg/min (07/13/11 1913)     Objective:  Blood pressure 95/60, pulse 75, temperature 98.3 F (36.8 C), temperature source Oral, resp.  rate 18, height 5\' 5"  (1.651 m), weight 64.8 kg (142 lb 13.7 oz), SpO2 96.00%. Weight change: -0.1 kg (-3.5 oz)  Physical Exam: General:  Well appearing. No resp difficulty HEENT: normal Neck: supple. JVP 5-6. Carotids 2+ bilat; no bruits. No lymphadenopathy or thryomegaly appreciated. Cor: PMI nondisplaced. Regular rate & rhythm. No rubs, gallops or murmurs. Lungs: clear Abdomen: soft, nontender, nondistended. No hepatosplenomegaly. No bruits or masses. Good bowel sounds. Extremities: no cyanosis, clubbing, rash, edema Neuro: alert & orientedx3, cranial nerves grossly intact. moves all 4 extremities w/o difficulty. Affect pleasant  Telemetry: Sinus Rhthm Lab Results: Basic Metabolic Panel:  Lab 07/13/11 1610 07/12/11 1249 07/12/11 0922 07/12/11 0400 07/11/11 0900 07/11/11 0355 07/10/11 0445 07/08/11 0252  NA 127* -- 134* -- 132* 132* 133* --  K 4.8 4.5 -- -- -- -- -- --  CL 91* -- 97 -- 98 98 96 --  CO2 25 -- 30 -- 24 27 29  --  GLUCOSE 140* -- 102* -- 170* 104* 104* --  BUN 21 -- 14 -- 10 11 15  --  CREATININE 1.27 -- 1.38* -- 1.06 1.17 1.06 --  CALCIUM 8.5 -- 9.5 -- 8.9 9.3 8.7 --  MG -- -- --  2.3 -- -- -- 1.5  PHOS -- -- -- -- -- -- -- --   Liver Function Tests: No results found for this basename: AST:5,ALT:5,ALKPHOS:5,BILITOT:5,PROT:5,ALBUMIN:5 in the last 168 hours No results found for this basename: LIPASE:5,AMYLASE:5 in the last 168 hours No results found for this basename: AMMONIA:5 in the last 168 hours CBC:  Lab 07/12/11 0400  WBC 4.3  NEUTROABS --  HGB 15.2  HCT 44.4  MCV 89.0  PLT 328   Cardiac Enzymes:  Lab 07/07/11 1620 07/07/11 0903  CKTOTAL 235* 251*  CKMB 1.6 1.7  CKMBINDEX -- --  TROPONINI <0.30 <0.30   BNP: No components found with this basename: POCBNP:5 CBG: No results found for this basename: GLUCAP:5 in the last 168 hours Microbiology: Lab Results  Component Value Date   CULT NO GROWTH 07/07/2011   CULT NO GROWTH 5 DAYS 07/07/2011   CULT  NO GROWTH 5 DAYS 07/07/2011   CULT NO GROWTH 5 DAYS 04/15/2008    Lab 07/07/11 1734 07/07/11 1615  CULT NO GROWTH NO GROWTH 5 DAYS  SDES URINE, CLEAN CATCH BLOOD RIGHT HAND    Imaging: No results found.   ASSESSMENT:  1. Cardiogenic shock due to Acute on Chronic non-ischemic Systolic Heart Failure, , LVEF 10% by echo (suspect low output)  2. Mitral regurgitation-moderate to severe  3. Left ventricular thrombus  4. Supra-therapeutic on Coumadin  5. NSVT s/p ICD  6. Hypokalemia  7. Ab pain - suspect due to #1  8. Tobacco use  9. Hyperkalemia    PLAN/DISCUSSION: Symptomatically improved on milrinone. BP improved after many of his HF meds decreased. Milrinone wean in process.   Will d/c milrinone today and watch for 24-48 hours. If develops recurrent symptoms will need repeat RHC and consideration of restarting home milrinone. (Still has Port-a-cath in place). Coumadin per pharmacy for LV thrombus.  Need to consider possible LVAD but compliance can be an issue at times.  I suspect he will need home milrinone again.   LOS: 8 days    Arvilla Meres, MD 07/14/2011, 8:41 AM

## 2011-07-15 LAB — PROTIME-INR: Prothrombin Time: 20.6 seconds — ABNORMAL HIGH (ref 11.6–15.2)

## 2011-07-15 LAB — BASIC METABOLIC PANEL
BUN: 24 mg/dL — ABNORMAL HIGH (ref 6–23)
Calcium: 9 mg/dL (ref 8.4–10.5)
Chloride: 93 mEq/L — ABNORMAL LOW (ref 96–112)
GFR calc Af Amer: 80 mL/min — ABNORMAL LOW (ref >90)
GFR calc Af Amer: 76 mL/min — ABNORMAL LOW (ref >90)
GFR calc non Af Amer: 69 mL/min — ABNORMAL LOW (ref >90)
GFR calc non Af Amer: 65 mL/min — ABNORMAL LOW (ref >90)
Glucose, Bld: 95 mg/dL (ref 70–99)
Potassium: 4.7 mEq/L (ref 3.5–5.1)
Potassium: 4.5 mEq/L (ref 3.5–5.1)
Sodium: 129 mEq/L — ABNORMAL LOW (ref 135–145)
Sodium: 130 mEq/L — ABNORMAL LOW (ref 135–145)

## 2011-07-15 LAB — DIGOXIN LEVEL: Digoxin Level: 0.5 ng/mL — ABNORMAL LOW (ref 0.8–2.0)

## 2011-07-15 MED ORDER — WARFARIN SODIUM 10 MG PO TABS
10.0000 mg | ORAL_TABLET | Freq: Once | ORAL | Status: AC
  Administered 2011-07-15: 10 mg via ORAL
  Filled 2011-07-15: qty 1

## 2011-07-15 NOTE — Progress Notes (Signed)
Subjective:  51 y/o male with a h/o of severe systolic HF due to NICM LVEF 10% by echo from 11/16/2010, admitted with several week h/o of chest and abdominal pain with n/v suspected due to low output. Pro-BNP 5088 up, but weight down due to poor appetite. He presented with NYHA IIIb symptoms. Chronic coumadin to LV thrombus. +tobacco use   Has previously been on transplant list (taken off due to failure to respond to page when heart available). Was also on home milrinone in past but symptoms imprtoved and he was weaned off.  Admitted with low output symptoms of fatigue, ab pain and poor appetite.   Underwent RHC 1/14 which showed low output physiology. Milrinone initiated. Coreg and hyrdralazine cut back.  RA = 7  RV = 45/6/7  PA = 45/31 (35)  PCW = 20  Fick cardiac output/index = 3.06/1.75  PVR = 5.2 Woods  FA sat = 96%  PA sat = 58%, 56%   Milrinone stopped yesterday. Feels fine. Ambulating room. Denies SOB/PND/Orthopnea. SBP stable in 78-90 range. No dizziness. Ab pain and early satiety resolved. Weight stable.     Intake/Output Summary (Last 24 hours) at 07/15/11 0801 Last data filed at 07/14/11 2000  Gross per 24 hour  Intake    480 ml  Output   1550 ml  Net  -1070 ml    Current meds:    . carvedilol  3.125 mg Oral BID WC  . digoxin  125 mcg Oral Daily  . hydrALAZINE  25 mg Oral TID  . isosorbide mononitrate  30 mg Oral BID  . lisinopril  5 mg Oral BID  . magnesium oxide  400 mg Oral Daily  . nicotine  21 mg Transdermal Daily  . torsemide  20 mg Oral BID  . warfarin  10 mg Oral ONCE-1800   Infusions:    . sodium chloride 15 mL/hr at 07/13/11 1948  . DISCONTD: milrinone 0.125 mcg/kg/min (07/13/11 1913)     Objective:  Blood pressure 88/55, pulse 81, temperature 98.3 F (36.8 C), temperature source Oral, resp. rate 18, height 5\' 5"  (1.651 m), weight 63.9 kg (140 lb 14 oz), SpO2 95.00%. Weight change: -0.9 kg (-1 lb 15.8 oz)  Physical Exam: General:  Well  appearing. No resp difficulty HEENT: normal Neck: supple. JVP 5-6. Carotids 2+ bilat; no bruits. No lymphadenopathy or thryomegaly appreciated. Cor: PMI nondisplaced. Regular rate & rhythm. No rubs, gallops or murmurs. Lungs: clear Abdomen: soft, nontender, nondistended. No hepatosplenomegaly. No bruits or masses. Good bowel sounds. Extremities: no cyanosis, clubbing, rash, edema Neuro: alert & orientedx3, cranial nerves grossly intact. moves all 4 extremities w/o difficulty. Affect pleasant  Telemetry: Sinus Rhthm Lab Results: Basic Metabolic Panel:  Lab 07/15/11 1610 07/13/11 0400 07/12/11 0922 07/12/11 0400 07/11/11 0900 07/11/11 0355  NA 129* 127* 134* -- 132* 132*  K 4.7 4.8 -- -- -- --  CL 93* 91* 97 -- 98 98  CO2 28 25 30  -- 24 27  GLUCOSE 95 140* 102* -- 170* 104*  BUN 25* 21 14 -- 10 11  CREATININE 1.21 1.27 1.38* -- 1.06 1.17  CALCIUM 9.0 8.5 9.5 -- 8.9 9.3  MG -- -- -- 2.3 -- --  PHOS -- -- -- -- -- --   Liver Function Tests: No results found for this basename: AST:5,ALT:5,ALKPHOS:5,BILITOT:5,PROT:5,ALBUMIN:5 in the last 168 hours No results found for this basename: LIPASE:5,AMYLASE:5 in the last 168 hours No results found for this basename: AMMONIA:5 in the last 168  hours CBC:  Lab 07/12/11 0400  WBC 4.3  NEUTROABS --  HGB 15.2  HCT 44.4  MCV 89.0  PLT 328   Cardiac Enzymes: No results found for this basename: CKTOTAL:5,CKMB:5,CKMBINDEX:5,TROPONINI:5 in the last 168 hours BNP: No components found with this basename: POCBNP:5 CBG: No results found for this basename: GLUCAP:5 in the last 168 hours Microbiology: Lab Results  Component Value Date   CULT NO GROWTH 07/07/2011   CULT NO GROWTH 5 DAYS 07/07/2011   CULT NO GROWTH 5 DAYS 07/07/2011   CULT NO GROWTH 5 DAYS 04/15/2008   No results found for this basename: CULT:2,SDES:2 in the last 168 hours  Imaging: No results found.   ASSESSMENT:  1. Cardiogenic shock due to Acute on Chronic non-ischemic  Systolic Heart Failure, , LVEF 10% by echo (suspect low output)  2. Mitral regurgitation-moderate to severe  3. Left ventricular thrombus  4. Supra-therapeutic on Coumadin  5. NSVT s/p ICD  6. Hypokalemia  7. Ab pain - suspect due to #1  8. Tobacco use  9. Hyperkalemia    PLAN/DISCUSSION:  Milrinone stopped yesterday. Will watch another 24 hours, if develops recurrent symptoms will need repeat RHC and consideration of restarting home milrinone. (Still has Port-a-cath in place). Coumadin per pharmacy for LV thrombus.  Need to consider possible LVAD but compliance can be an issue at times.  I suspect he will need home milrinone again.   LOS: 9 days    Arvilla Meres, MD 07/15/2011, 8:01 AM

## 2011-07-15 NOTE — Progress Notes (Signed)
07/15/11 1430 UR Completed. Tera Mater, RN, BSN

## 2011-07-15 NOTE — Progress Notes (Signed)
ANTICOAGULATION CONSULT NOTE - Follow Up Consult  Pharmacy Consult for  Coumadin Indication:  LV thrombus  No Known Allergies  Patient Measurements: Height: 5\' 5"  (165.1 cm) Weight: 140 lb 14 oz (63.9 kg) IBW/kg (Calculated) : 61.5   Vital Signs: Temp: 97.7 F (36.5 C) (01/21 1126) Temp src: Oral (01/21 1126) BP: 86/60 mmHg (01/21 1126) Pulse Rate: 75  (01/21 1126)  Labs:  Basename 07/15/11 1000 07/15/11 0500 07/14/11 0527 07/13/11 0400  HGB -- -- -- --  HCT -- -- -- --  PLT -- -- -- --  APTT -- -- -- --  LABPROT -- 20.6* 19.6* 19.5*  INR -- 1.73* 1.63* 1.62*  HEPARINUNFRC -- -- -- --  CREATININE 1.27 1.21 -- 1.27  CKTOTAL -- -- -- --  CKMB -- -- -- --  TROPONINI -- -- -- --   Estimated Creatinine Clearance: 61.9 ml/min (by C-G formula based on Cr of 1.27).  Assessment: Patient on Coumadin PTA for LV thrombus. INR is below goal, but trending up nicely. Home Coumadin dose was 5 mg daily, but INR 4.20 on admit 1/12.  INR reversed with  Vitamin K 2.5 mg PO given 1/13. Last CBC check 1/17, was WNL. No overt bleeding reported.  Goal of Therapy:  INR 2-3   Plan:  1. Repeat Coumadin 10mg  PO x1 again today 2. F/up daily INR would resume home regimen once INR stable and within goal.  Emeri Estill K. Allena Katz, PharmD, BCPS.  Clinical Pharmacist Pager [phone]. 07/15/2011 2:57 PM

## 2011-07-16 LAB — PROTIME-INR
INR: 2.13 — ABNORMAL HIGH (ref 0.00–1.49)
Prothrombin Time: 24.2 seconds — ABNORMAL HIGH (ref 11.6–15.2)

## 2011-07-16 MED ORDER — LISINOPRIL 10 MG PO TABS: 5.0000 mg | ORAL_TABLET | Freq: Two times a day (BID) | ORAL | Status: DC

## 2011-07-16 MED ORDER — CARVEDILOL 3.125 MG PO TABS: 3.1250 mg | ORAL_TABLET | Freq: Two times a day (BID) | ORAL | Status: DC

## 2011-07-16 MED ORDER — NICOTINE 21 MG/24HR TD PT24: MEDICATED_PATCH | TRANSDERMAL | Status: DC

## 2011-07-16 MED ORDER — WARFARIN SODIUM 5 MG PO TABS
5.0000 mg | ORAL_TABLET | Freq: Once | ORAL | Status: DC
  Filled 2011-07-16: qty 1

## 2011-07-16 MED ORDER — HYDRALAZINE HCL 50 MG PO TABS: 25.0000 mg | ORAL_TABLET | Freq: Three times a day (TID) | ORAL | Status: DC

## 2011-07-16 NOTE — Discharge Summary (Signed)
Patient ID: Agostino Gorin MRN: [id number] DOB/AGE: July 17, 1960 51 y.o.  Admit date: 07/06/2011 Discharge date: 07/16/2011  Primary Discharge Diagnosis 1. Cardiogenic shock due to Acute on Chronic non-ischemic Systolic Heart Failure, , LVEF 10% by echo (suspect low output)  2. Mitral regurgitation-moderate to severe  3. Left ventricular thrombus  4. Supra-therapeutic on Coumadin  5. NSVT s/p ICD  6. Hypokalemia  7. Ab pain - suspect due to #1 (resolved) 8. Tobacco use  9. Hyperkalemia (resolved)   Secondary Discharge Diagnosis   Significant Diagnostic Studies:  07/08/11 RHC  l RA = 7  RV = 45/6/7  PA = 45/31 (35)  PCW = 20  Fick cardiac output/index = 3.06/1.75  PVR = 5.2 Woods  FA sat = 96%  PA sat = 58%, 56%      Hospital Course:  51 y/o male with a h/o of severe systolic HF due to NICM LVEF 10% by echo from 11/16/2010, admitted with several week h/o of chest and abdominal pain with n/v suspected due to low output. Pro-BNP 5088 up, but weight down due to poor appetite. He presented with NYHA IIIb symptoms. Chronic coumadin to LV thrombus. +tobacco use  Has previously been on transplant list (taken off due to failure to respond to page when heart available). Was also on home milrinone in past but symptoms imprtoved and he was weaned off.    Admitted with low output symptoms of fatigue, abdominal  pain and poor appetite. Admit weight 147 pounds.  RHC completed 07/08/11 revealed low output physiology. Milrinone initiated with marked improvement in symptoms. Spironolactone stopped and carvedilol and hydralazine cut back due to hypotension with SBP in 70s.. Milrinone was weaned off 07/14/11 and he was able to ambulate floors without any difficulty. SBP stable in 80-90 range. No dizziness. Abdominal pain resolved and appetite improved. Discharge weight 140 punds. While hospitalized he met with the VAD coordinator to discuss possible VAD placement. Watched videos. He will need to be  compliant and have adequate social support if VAD is pursued.  Says his mother may be a resource to help him and it was requested that she accompany him to his next clinic visit. He may not be able to over come barriers. He will be followed closely in the Heart Failure Clinic. Discussed need to contact HF clinic for recurrent low-flow symptoms and possible need for re-initiation of milrinone. While in hospital also had some NSVT - which is common for him.   Physical Exam:  General: Well appearing. No resp difficulty  HEENT: normal  Neck: supple. JVP 5-6. Carotids 2+ bilat; no bruits. No lymphadenopathy or thryomegaly appreciated.  Cor: PMI nondisplaced. Regular rate & rhythm. No rubs, gallops or murmurs.  Lungs: clear  Abdomen: soft, nontender, nondistended. No hepatosplenomegaly. No bruits or masses. Good bowel sounds.  Extremities: no cyanosis, clubbing, rash, edema  Neuro: alert & orientedx3, cranial nerves grossly intact. moves all 4 extremities w/o difficulty. Affect pleasant        Discharge Info: Blood pressure 94/59, pulse 79, temperature 98.5 F (36.9 C), temperature source Oral, resp. rate 18, height 5\' 5"  (1.651 m), weight 63.8 kg (140 lb 10.5 oz), SpO2 96.00%. Weight change: -0.1 kg (-3.5 oz) Results for orders placed during the hospital encounter of 07/06/11 (from the past 24 hour(s))  PROTIME-INR     Status: Abnormal   Collection Time   07/16/11  4:00 AM      Component Value Range   Prothrombin Time 24.2 (*) 11.6 -  15.2 (seconds)   INR 2.13 (*) 0.00 - 1.49      Discharge Medications: Current Discharge Medication List    START taking these medications   Details  nicotine (NICODERM CQ - DOSED IN MG/24 HOURS) 21 mg/24hr patch Apply one patch daily Qty: 7 patch, Refills: 0      CONTINUE these medications which have CHANGED   Details  carvedilol (COREG) 3.125 MG tablet Take 1 tablet (3.125 mg total) by mouth 2 (two) times daily with a meal. Qty: 60 tablet, Refills: 6     hydrALAZINE (APRESOLINE) 50 MG tablet Take 0.5 tablets (25 mg total) by mouth 3 (three) times daily. Qty: 15 tablet, Refills: 6    lisinopril (PRINIVIL,ZESTRIL) 10 MG tablet Take 0.5 tablets (5 mg total) by mouth 2 (two) times daily. Qty: 30 tablet, Refills: 6   Associated Diagnoses: Chronic systolic heart failure      CONTINUE these medications which have NOT CHANGED   Details  torsemide (DEMADEX) 20 MG tablet Take 20 mg by mouth 2 (two) times daily.     warfarin (COUMADIN) 5 MG tablet Take 1 tablet (5 mg total) by mouth daily. Qty: 35 tablet, Refills: 1    digoxin (LANOXIN) 0.125 MG tablet Take 125 mcg by mouth daily.      isosorbide mononitrate (IMDUR) 30 MG 24 hr tablet TAKE 1 TABLET BY MOUTH TWO TIMES A DAY Qty: 60 tablet, Refills: 10      STOP taking these medications     spironolactone (ALDACTONE) 25 MG tablet         Follow-up Plans & Instructions: Discharge Orders    Future Appointments: Provider: Department: Dept Phone: Center:   07/22/2011 1:45 PM [facility] [phone] None   07/25/2011 3:00 PM Tiffany Daneil Dan, RN [facility] [phone] None     Follow-up Information    Follow up with Arvilla Meres, MD on 07/22/2011. (1:45 Garage Code [phone])    Contact information:   [facility] [address] [place] [address] [phone]       Follow up with [facility] on 07/18/2011. (at 4:15)    Contact information:   [facility]          BRING ALL MEDICATIONS WITH YOU TO FOLLOW UP APPOINTMENTS  Time spent with patient to include physician time: 45 minutes Signed:  Arvilla Meres, MD 07/16/2011, 8:54 AM

## 2011-07-16 NOTE — Progress Notes (Signed)
ANTICOAGULATION CONSULT NOTE - Follow Up Consult  Pharmacy Consult for  Coumadin Indication:  LV thrombus  No Known Allergies  Patient Measurements: Height: 5\' 5"  (165.1 cm) Weight: 140 lb 10.5 oz (63.8 kg) IBW/kg (Calculated) : 61.5   Vital Signs: Temp: 98.5 F (36.9 C) (01/22 0739) Temp src: Oral (01/22 0739) BP: 86/50 mmHg (01/22 0739) Pulse Rate: 79  (01/22 0739)  Labs:  Basename 07/16/11 0400 07/15/11 1000 07/15/11 0500 07/14/11 0527  HGB -- -- -- --  HCT -- -- -- --  PLT -- -- -- --  APTT -- -- -- --  LABPROT 24.2* -- 20.6* 19.6*  INR 2.13* -- 1.73* 1.63*  HEPARINUNFRC -- -- -- --  CREATININE -- 1.27 1.21 --  CKTOTAL -- -- -- --  CKMB -- -- -- --  TROPONINI -- -- -- --   Estimated Creatinine Clearance: 61.9 ml/min (by C-G formula based on Cr of 1.27).  Assessment: Patient on Coumadin PTA for LV thrombus. INR therapeutic today, trending up nicely. Home Coumadin dose was 5 mg daily, but INR 4.20 on admit 1/12.  (INR 2.1 at last outpatient Coumadin Clinic visit).  INR reversed with  Vitamin K 2.5 mg PO given 1/13.  Doses larger than home dose have been required to reach therapeutic INR. Last CBC check 1/17, was WNL. No overt bleeding reported.  Goal of Therapy:  INR 2-3   Plan:  1. Coumadin 5 mg x 1 today. 2. F/up daily INR would resume home regimen once INR stable and within goal.  Reece Leader, Pharm D 07/16/2011 7:56 AM

## 2011-07-16 NOTE — Progress Notes (Signed)
DISCHARGED  HOME  ,AMBULATORY, STABLE, ACCOMPANIED BY FRIEND  DISCHARGE INSTRUCTIONS  AND BELONGINGS WITH PT.

## 2011-07-16 NOTE — Progress Notes (Signed)
Pt going home; port flushed per policy with ns and 500 units heparin; port de-accessed; unable to chart on MAR or enter standing orders into the computer;

## 2011-07-16 NOTE — Progress Notes (Signed)
   CARE MANAGEMENT NOTE HEART FAILURE  07/16/2011   Patient:  [name], [name]   Account Number:  [id number]    Date Initiated:  07/11/2011  Documentation initiated by:  Tera Mater  Subjective/Objective Assessment:   51yo male admitted with severe CHF.  HX:  LVEF 10%, CHF, HTN, MR, RI.  On Milrinone gtt.   Action/Plan:   Discharge planning for possible Milrinone gtt at home.   Anticipated DC Date:  07/14/2011  Anticipated DC Plan:  HOME W HOME HEALTH SERVICES  DC Planning Services:  CM consult    Choice offered to / List presented to:          Status of service:  Completed, signed off  Medicare Important Message Given:   (If response is "NO", the following Medicare IM given date fields will be blank) Date Medicare IM Given:   Date Additional Medicare IM Given:    Discharge Disposition:  HOME/SELF CARE  Per UR Regulation:  Reviewed for med. necessity/level of care/duration of stay  Comments:   07/16/11 1030 Pt. off Milrinone and doing well.  No clinical need for home Milrinone at this time. Spoke with pt. in reference to [facility] services for HF managment and pt. refused any assistance, stating he "was okay."  Tonye Becket, NP aware.  Pt. will be discharged home today. Tera Mater, RN, BSN  07/15/11 1430 UR Completed. Tera Mater, RN, BSN  07/12/11 1500 Pt. currently on Milrinone gtt at 0.25mg /kg/min.  Orders for slow titration this weekend with plans of d/c by Sunday.  Spoke with pt. about possiblity of Home Milrinone and pt. stated he used [facility] in past and was satisfied with their services.  Marie with [facility] is aware of possible discharge over weekend.  Pt. has support from cousin.  Will leave handoff for weekend CM for possiblity of home on Milrinone.  NCM will follow thru discharge. Tera Mater, RN, BSN   07/11/11 1630 UR Completed. Tera Mater, RN, BSN   Initial CM contact:  07/12/2011 03:00 PM  By:  Tera Mater Initial CSW  contact:     By:      Is this an INP Readmission < 30 days:  N (If "YES" please see readmission information at the bottom of note)  Patient living status prior to this admission:  ALONE  Patient setting prior to this admission:  HOME  Comorbid conditions being treated that contributed to this admission:  CHF, THN, MR, RI  CHF Readmission Risk:  high  Type of patient education provided  HF Patient Education Assessment / Teach Back     Patient education provided by  [facility]    Was referral made to [facility]:  N  Is the patient's PCP the same as attending:  Y PCP:  BENSIMHON,DANIEL R  Readmission < 30 Days If pt has HH, did they contact the agency before going to the ED:   Name of [facility] agency:    Was the follow-up physician visit scheduled prior to discharge:    Did the patient follow-up with the physician prior to this readmission:    Was there HF Clinic visits prior to readmission:    Were there ED visits between admissions:    Readmit type:    If unscheduled and related indicate reason for readmit:

## 2011-07-17 ENCOUNTER — Telehealth (HOSPITAL_COMMUNITY): Payer: Self-pay | Admitting: *Deleted

## 2011-07-17 DIAGNOSIS — I5022 Chronic systolic (congestive) heart failure: Secondary | ICD-10-CM

## 2011-07-17 MED ORDER — NICOTINE 21 MG/24HR TD PT24: MEDICATED_PATCH | TRANSDERMAL | Status: DC

## 2011-07-17 MED ORDER — DIGOXIN 125 MCG PO TABS: 125.0000 ug | ORAL_TABLET | Freq: Every day | ORAL | Status: DC

## 2011-07-17 MED ORDER — TORSEMIDE 20 MG PO TABS: 20.0000 mg | ORAL_TABLET | Freq: Two times a day (BID) | ORAL | Status: DC

## 2011-07-17 MED ORDER — ISOSORBIDE MONONITRATE ER 30 MG PO TB24: 30.0000 mg | ORAL_TABLET | Freq: Two times a day (BID) | ORAL | Status: DC

## 2011-07-17 MED ORDER — HYDRALAZINE HCL 50 MG PO TABS: 25.0000 mg | ORAL_TABLET | Freq: Three times a day (TID) | ORAL | Status: DC

## 2011-07-17 MED ORDER — LISINOPRIL 10 MG PO TABS: 5.0000 mg | ORAL_TABLET | Freq: Two times a day (BID) | ORAL | Status: DC

## 2011-07-17 MED ORDER — CARVEDILOL 3.125 MG PO TABS: 3.1250 mg | ORAL_TABLET | Freq: Two times a day (BID) | ORAL | Status: DC

## 2011-07-17 NOTE — Telephone Encounter (Signed)
[name] called this am, he needs refills called in for his medications.  He would also like a call back to confirm that they have been called in.  Thanks!

## 2011-07-17 NOTE — Telephone Encounter (Signed)
Pt aware prescriptions were sent in  

## 2011-07-18 ENCOUNTER — Ambulatory Visit (INDEPENDENT_AMBULATORY_CARE_PROVIDER_SITE_OTHER): Payer: Medicare Other | Admitting: Pharmacist

## 2011-07-18 DIAGNOSIS — I8290 Acute embolism and thrombosis of unspecified vein: Secondary | ICD-10-CM

## 2011-07-18 DIAGNOSIS — I749 Embolism and thrombosis of unspecified artery: Secondary | ICD-10-CM

## 2011-07-18 LAB — POCT INR: INR: 2.4

## 2011-07-22 ENCOUNTER — Encounter (HOSPITAL_COMMUNITY): Payer: Self-pay

## 2011-07-22 ENCOUNTER — Ambulatory Visit (HOSPITAL_COMMUNITY)
Admission: RE | Admit: 2011-07-22 | Discharge: 2011-07-22 | Disposition: A | Discharge status: Home / Self Care | Payer: Medicare Other | Source: Ambulatory Visit | Attending: Internal Medicine | Admitting: Internal Medicine

## 2011-07-22 VITALS — BP 102/68 | HR 78 | Wt 151.5 lb

## 2011-07-22 DIAGNOSIS — I5022 Chronic systolic (congestive) heart failure: Secondary | ICD-10-CM | POA: Insufficient documentation

## 2011-07-22 DIAGNOSIS — F17200 Nicotine dependence, unspecified, uncomplicated: Secondary | ICD-10-CM | POA: Insufficient documentation

## 2011-07-22 MED ORDER — NICOTINE 21 MG/24HR TD PT24: MEDICATED_PATCH | TRANSDERMAL | Status: DC

## 2011-07-22 NOTE — Progress Notes (Signed)
HPI:  [name] is 51 y/o with severe CHF due to NICM EF 10-15%, severe MR previously on home milrinone. Was previously on transplant list but was taken off as he didn't respond to a page when a heart became available. Also has h/o mild CRI, NSVT s/p ICD and LV thrombus.    Had CPX 9/11: pVO2 23.3  Ve/VCo2 29  RER 1.22 O2 pulse 64%  Repeat echo 11/16/10: EF 10% moderate MR  Admitted earlier this month with low output symptoms of fatigue, abdominal pain and poor appetite. Admit weight 147 pounds. RHC completed 07/08/11 revealed low output physiology.  RA = 7  RV = 45/6/7  PA = 45/31 (35)  PCW = 20  Fick cardiac output/index = 3.06/1.75  PVR = 5.2 Woods  FA sat = 96%  PA sat = 58%, 56%     Milrinone initiated with marked improvement in symptoms. Spironolactone stopped and carvedilol and hydralazine cut back due to hypotension with SBP in 70s.. Milrinone was weaned off 07/14/11 and he was able to ambulate floors without any difficulty. SBP stable in 80-90 range. Possibility of LVAD raised with him while in hospital. Need for family support stressed. D/c weight 140.  Returns today for post-hospital f/u with nearly 10 family members present.  Doing fairly well. Says he has good days and bad days. On his good days feels he isn't sick at all on bad days lays in bed all day wiped out. Bad days happen about 1-2 days per month. Smoking 3 cigs/day.  Reports compliance with all meds. No orthopnea, PND or edema. No ICD firings.  Weight stable.  Weighing every day. Uses metolazone as needed.  BP low but no dizziness.    ROS: All systems negative except as listed in HPI, PMH and Problem List.  Past Medical History  Diagnosis Date  . CHF (congestive heart failure)     EF- 10-15  . Medically noncompliant   . Mitral regurgitation   . Tobacco user   . HTN (hypertension)   . Depression   . AICD (automatic cardioverter/defibrillator) present   . GERD (gastroesophageal reflux disease)   . Substance abuse    . Migraines   . Chronic renal insufficiency   . Syncope   . Sleep apnea     Current Outpatient Prescriptions  Medication Sig Dispense Refill  . carvedilol (COREG) 3.125 MG tablet Take 1 tablet (3.125 mg total) by mouth 2 (two) times daily with a meal.  60 tablet  6  . digoxin (LANOXIN) 0.125 MG tablet Take 1 tablet (125 mcg total) by mouth daily.  30 tablet  6  . hydrALAZINE (APRESOLINE) 50 MG tablet Take 0.5 tablets (25 mg total) by mouth 3 (three) times daily.  45 tablet  6  . isosorbide mononitrate (IMDUR) 30 MG 24 hr tablet Take 1 tablet (30 mg total) by mouth 2 (two) times daily.  60 tablet  6  . lisinopril (PRINIVIL,ZESTRIL) 10 MG tablet Take 0.5 tablets (5 mg total) by mouth 2 (two) times daily.  30 tablet  6  . nicotine (NICODERM CQ - DOSED IN MG/24 HOURS) 21 mg/24hr patch Apply one patch daily  21 patch  0  . torsemide (DEMADEX) 20 MG tablet Take 1 tablet (20 mg total) by mouth 2 (two) times daily.  60 tablet  6  . warfarin (COUMADIN) 5 MG tablet Take 1 tablet (5 mg total) by mouth daily.  35 tablet  1     PHYSICAL EXAM:  Filed Vitals:  07/22/11 1407  BP: 102/68  Pulse: 78  Weight: 151 lb 8 oz (68.72 kg)  SpO2: 98%     General:  Well appearing. No resp difficulty HEENT: normal Neck: supple. JVP 6. Carotids 2+ bilaterally; no bruits. No lymphadenopathy or thryomegaly appreciated. Cor: PMI laterally displaced. Regular rate & rhythm.3/6 MR + soft s3 Lungs: clear Abdomen: soft, nontender, non distended. No hepatosplenomegaly. No bruits or masses. Good bowel sounds. Extremities: no cyanosis, clubbing, rash, no edema Neuro: alert & orientedx3, cranial nerves grossly intact. Moves all 4 extremities w/o difficulty. Affect pleasant.  ASSESSMENT & PLAN:

## 2011-07-22 NOTE — Patient Instructions (Signed)
Follow up in 3 weeks  Do the following things EVERYDAY: 1) Weigh yourself in the morning before breakfast. Write it down and keep it in a log. 2) Take your medicines as prescribed 3) Eat low salt foods--Limit salt (sodium) to 2000mg per day.  4) Stay as active as you can everyday 

## 2011-07-25 ENCOUNTER — Encounter: Payer: Medicare Other | Admitting: *Deleted

## 2011-07-28 NOTE — Assessment & Plan Note (Signed)
Reinforced need for smoking cessation.

## 2011-07-28 NOTE — Assessment & Plan Note (Addendum)
Extensive discussion with [name] and all his family about the severity of his HF and likely need for advanced therapies in the near future. Explained that he would likely not be a transplant candidate at this point due to previous noncompliance  and recent tobacco use. However, his compliance has improved dramatically and family here to show support for him and to provide assistance. We talked about pros/cons of LVAD therapy. Will proceed with CPX. If getting worse may need to reinitiate home milrinone. Reinforced need for smoking cessation.   Total MD time spent = 60 mins with over 80% of that time dedicated to counseling and discussions described above.

## 2011-08-01 ENCOUNTER — Ambulatory Visit (INDEPENDENT_AMBULATORY_CARE_PROVIDER_SITE_OTHER): Payer: Medicare Other | Admitting: *Deleted

## 2011-08-01 ENCOUNTER — Encounter: Payer: Medicare Other | Admitting: *Deleted

## 2011-08-01 DIAGNOSIS — I749 Embolism and thrombosis of unspecified artery: Secondary | ICD-10-CM

## 2011-08-01 DIAGNOSIS — I8290 Acute embolism and thrombosis of unspecified vein: Secondary | ICD-10-CM

## 2011-08-01 LAB — POCT INR: INR: 3.4

## 2011-08-06 ENCOUNTER — Ambulatory Visit (HOSPITAL_COMMUNITY): Payer: Medicare Other | Attending: Internal Medicine

## 2011-08-06 DIAGNOSIS — I5022 Chronic systolic (congestive) heart failure: Secondary | ICD-10-CM | POA: Insufficient documentation

## 2011-08-06 DIAGNOSIS — R0989 Other specified symptoms and signs involving the circulatory and respiratory systems: Secondary | ICD-10-CM

## 2011-08-06 DIAGNOSIS — R0609 Other forms of dyspnea: Secondary | ICD-10-CM

## 2011-08-09 ENCOUNTER — Encounter (HOSPITAL_COMMUNITY): Payer: Self-pay

## 2011-08-09 ENCOUNTER — Ambulatory Visit (HOSPITAL_COMMUNITY)
Admission: RE | Admit: 2011-08-09 | Discharge: 2011-08-09 | Disposition: A | Discharge status: Home / Self Care | Payer: Medicare Other | Source: Ambulatory Visit | Attending: Internal Medicine | Admitting: Internal Medicine

## 2011-08-09 VITALS — HR 93 | Wt 145.8 lb

## 2011-08-09 DIAGNOSIS — I5022 Chronic systolic (congestive) heart failure: Secondary | ICD-10-CM | POA: Insufficient documentation

## 2011-08-09 DIAGNOSIS — I959 Hypotension, unspecified: Secondary | ICD-10-CM

## 2011-08-09 DIAGNOSIS — I509 Heart failure, unspecified: Secondary | ICD-10-CM

## 2011-08-09 MED ORDER — SODIUM CHLORIDE 0.9 % IV BOLUS (SEPSIS)
500.0000 mL | Freq: Once | INTRAVENOUS | Status: AC
  Administered 2011-08-09: 500 mL via INTRAVENOUS
  Filled 2011-08-09: qty 500

## 2011-08-09 MED ORDER — HYDRALAZINE HCL 25 MG PO TABS: 12.5000 mg | ORAL_TABLET | Freq: Three times a day (TID) | ORAL | Status: DC

## 2011-08-09 NOTE — Patient Instructions (Signed)
Stop Carvedilol  Decrease Hydralazine 12.5 mg TID  Follow up Tuesday August 13, 2011

## 2011-08-09 NOTE — Progress Notes (Signed)
Pt's porta cath was access by IV Team and NS infusion started pt tolerated well, will continue to monitor

## 2011-08-09 NOTE — Progress Notes (Signed)
500 cc of NS bolus complete, pt's BP is up to 72 by doppler per Dr Gala Romney give another 500 cc NS bolus which was started

## 2011-08-10 DIAGNOSIS — I959 Hypotension, unspecified: Secondary | ICD-10-CM | POA: Insufficient documentation

## 2011-08-10 NOTE — Assessment & Plan Note (Addendum)
He is quite hypotensive in clinic today and appears dry. We have given him 1L NS in clinic with SBP up to mid 90s and symptomatic improvement. We will hold diuretics unless weight going back up. Stop carvedilol and cut hydralazine in half. He appears to be progressing toward need for re-initiation of inotropic support and possible LVAD, however CPX numbers surprisingly good. Will continue to follow very closely. See back in clinic next week. Have congratulated him on stopping smoking. We will need to follow serum cotinine levels. Time spent throughout encounter was 50 minutes.

## 2011-08-10 NOTE — Progress Notes (Signed)
Patient ID: [name], male   DOB: 1960-09-29, 51 y.o.   MRN: [id number] Patient ID: [name], male   DOB: 19-Feb-1961, 51 y.o.   MRN: [id number] HPI:  [name] is 51 y/o with severe CHF due to NICM EF 10-15%, severe MR previously on home milrinone. Was previously on transplant list but was taken off as he didn't respond to a page when a heart became available. Also has h/o mild CRI, NSVT s/p ICD and LV thrombus.    Had CPX 9/11: pVO2 23.3  Ve/VCo2 29  RER 1.22 O2 pulse 64%  Repeat echo 11/16/10: EF 10% moderate MR  Admitted earlier this month with low output symptoms of fatigue, abdominal pain and poor appetite. Admit weight 147 pounds. RHC completed 07/08/11 revealed low output physiology.  RA = 7  RV = 45/6/7  PA = 45/31 (35)  PCW = 20  Fick cardiac output/index = 3.06/1.75  PVR = 5.2 Woods  FA sat = 96%  PA sat = 58%, 56%    Milrinone initiated with marked improvement in symptoms. Spironolactone stopped and carvedilol and hydralazine cut back due to hypotension with SBP in 70s.. Milrinone was weaned off 07/14/11 and he was able to ambulate floors without any difficulty. SBP stable in 80-90 range. Possibility of LVAD raised with him while in hospital. Need for family support stressed. D/c weight 140.  CPX 2/13: VO2: 23.6 ml/kg/min (65% predicted)  VE/VCO2 30.2 RER 1.27  Returns today for follow up. Feels terrible for that last few days. Poor appetite. Weight decreasing at home 150. SOB on exertion. He did take two Metolazone this week. Complaining of foot pain. No energy. Dizziness for the last few days.    ROS: All systems negative except as listed in HPI, PMH and Problem List.  Past Medical History  Diagnosis Date  . CHF (congestive heart failure)     EF- 10-15  . Medically noncompliant   . Mitral regurgitation   . Tobacco user   . HTN (hypertension)   . Depression   . AICD (automatic cardioverter/defibrillator) present   . GERD (gastroesophageal reflux disease)   . Substance  abuse   . Migraines   . Chronic renal insufficiency   . Syncope   . Sleep apnea     Current Outpatient Prescriptions  Medication Sig Dispense Refill  . digoxin (LANOXIN) 0.125 MG tablet Take 1 tablet (125 mcg total) by mouth daily.  30 tablet  6  . hydrALAZINE (APRESOLINE) 25 MG tablet Take 0.5 tablets (12.5 mg total) by mouth 3 (three) times daily.  15 tablet  6  . isosorbide mononitrate (IMDUR) 30 MG 24 hr tablet Take 1 tablet (30 mg total) by mouth 2 (two) times daily.  60 tablet  6  . lisinopril (PRINIVIL,ZESTRIL) 10 MG tablet Take 0.5 tablets (5 mg total) by mouth 2 (two) times daily.  30 tablet  6  . nicotine (NICODERM CQ - DOSED IN MG/24 HOURS) 21 mg/24hr patch Apply one patch daily  21 patch  0  . torsemide (DEMADEX) 20 MG tablet Take 1 tablet (20 mg total) by mouth 2 (two) times daily.  60 tablet  6  . warfarin (COUMADIN) 5 MG tablet Take 1 tablet (5 mg total) by mouth daily.  35 tablet  1     PHYSICAL EXAM:  Filed Vitals:   08/09/11 1030  Pulse: 93  Weight: 145 lb 12.8 oz (66.134 kg)  SpO2: 96%  BP by doppler in 60-70 range   General:  Well appearing despite low BP. No resp difficulty HEENT: normal Neck: supple. JVP flat. Carotids 2+ bilaterally; no bruits. No lymphadenopathy or thryomegaly appreciated. Cor: PMI laterally displaced. Regular rate & rhythm.3/6 MR + S3 Lungs: clear Abdomen: soft, nontender, non distended. No hepatosplenomegaly. No bruits or masses. Good bowel sounds. Extremities: no cyanosis, clubbing, rash, no edema Neuro: alert & orientedx3, cranial nerves grossly intact. Moves all 4 extremities w/o difficulty. Affect pleasant.  ASSESSMENT & PLAN:

## 2011-08-10 NOTE — Assessment & Plan Note (Signed)
As above.

## 2011-08-13 ENCOUNTER — Ambulatory Visit (HOSPITAL_COMMUNITY)
Admission: RE | Admit: 2011-08-13 | Discharge: 2011-08-13 | Disposition: A | Discharge status: Home / Self Care | Payer: Medicare Other | Source: Ambulatory Visit | Attending: Internal Medicine | Admitting: Internal Medicine

## 2011-08-13 VITALS — HR 107 | Wt 152.0 lb

## 2011-08-13 DIAGNOSIS — I5022 Chronic systolic (congestive) heart failure: Secondary | ICD-10-CM | POA: Insufficient documentation

## 2011-08-13 LAB — BASIC METABOLIC PANEL
CO2: 33 mEq/L — ABNORMAL HIGH (ref 19–32)
Calcium: 10.1 mg/dL (ref 8.4–10.5)
Chloride: 94 mEq/L — ABNORMAL LOW (ref 96–112)
Creatinine, Ser: 1.18 mg/dL (ref 0.50–1.35)
Glucose, Bld: 93 mg/dL (ref 70–99)
Sodium: 137 mEq/L (ref 135–145)

## 2011-08-13 MED ORDER — ISOSORBIDE MONONITRATE ER 30 MG PO TB24: 30.0000 mg | ORAL_TABLET | Freq: Every day | ORAL | Status: DC

## 2011-08-13 NOTE — Assessment & Plan Note (Addendum)
Difficult situation. He continues to be hypotension and we are continuing to pull back on medical regimen because of it. However, CPX looks pretty good and is unchanged from last year. We will stop lisinopril and cut Imdur to daily. We will follow him very closely. I suspect he may need a VAD soon. He seems to have significantly rededicated himself to his HF care. He is no longer smoking. I am hopeful that someday we may be again able to consider him for a transplant.

## 2011-08-13 NOTE — Assessment & Plan Note (Signed)
As above, stop lisinopril and decrease imdur 30 mg daily.

## 2011-08-13 NOTE — Patient Instructions (Addendum)
Stop lisinopril.  Take isosorbide mononitrate (imdur) 30 mg once daily.  Follow up 3 weeks.

## 2011-08-13 NOTE — Assessment & Plan Note (Signed)
Congratulated him on tobacco cessation.  Will check nicotine level today to ensure cessation.

## 2011-08-15 ENCOUNTER — Telehealth (HOSPITAL_COMMUNITY): Payer: Self-pay | Admitting: *Deleted

## 2011-08-15 DIAGNOSIS — I5022 Chronic systolic (congestive) heart failure: Secondary | ICD-10-CM

## 2011-08-15 MED ORDER — POTASSIUM CHLORIDE CRYS ER 20 MEQ PO TBCR: 20.0000 meq | EXTENDED_RELEASE_TABLET | Freq: Every day | ORAL | Status: DC

## 2011-08-15 NOTE — Telephone Encounter (Signed)
Returning call about lab results, please call undrea/cam/08/15/11

## 2011-08-15 NOTE — Telephone Encounter (Signed)
Spoke w/pt he is aware to get kcl and take 3 tabs today and then 1 daily will recheck bmet 2/28 at [facility]

## 2011-08-17 NOTE — Progress Notes (Signed)
HPI:  [name] is 51 y/o with severe CHF due to NICM EF 10-15%, severe MR previously on home milrinone. Was previously on transplant list but was taken off as he didn't respond to a page when a heart became available. Also has h/o mild CRI, NSVT s/p ICD and LV thrombus.    Had CPX 9/11: pVO2 23.3  Ve/VCo2 29  RER 1.22 O2 pulse 64%  Repeat echo 11/16/10: EF 10% moderate MR  Admitted earlier this month with low output symptoms of fatigue, abdominal pain and poor appetite. Admit weight 147 pounds. RHC completed 07/08/11 revealed low output physiology.  Milrinone used with diuresis of 7 lbs.    RHC 07/08/11 RA = 7  RV = 45/6/7  PA = 45/31 (35)  PCW = 20  Fick cardiac output/index = 3.06/1.75  PVR = 5.2 Woods  FA sat = 96%  PA sat = 58%, 56%   CPX 2/13: VO2: 23.6 ml/kg/min (65% predicted)  VE/VCO2 30.2 RER 1.27  Returns today for follow up.  Last week he was hypotensive and received 1L of fluid in the clinic, his hydralazine was cut back and his coreg discontinued.  He feels much better today.  Continues to be dizzy with ambulation.  Weight stable.  Chronic SOB with exertion.  No orthopnea/PND.  Compliant with medication.  No tobacco since 06/27/11.   ROS: All systems negative except as listed in HPI, PMH and Problem List.  Past Medical History  Diagnosis Date  . CHF (congestive heart failure)     EF- 10-15  . Medically noncompliant   . Mitral regurgitation   . Tobacco user   . HTN (hypertension)   . Depression   . AICD (automatic cardioverter/defibrillator) present   . GERD (gastroesophageal reflux disease)   . Substance abuse   . Migraines   . Chronic renal insufficiency   . Syncope   . Sleep apnea     Current Outpatient Prescriptions  Medication Sig Dispense Refill  . digoxin (LANOXIN) 0.125 MG tablet Take 1 tablet (125 mcg total) by mouth daily.  30 tablet  6  . hydrALAZINE (APRESOLINE) 25 MG tablet Take 6.25 mg by mouth 3 (three) times daily.      . isosorbide mononitrate  (IMDUR) 30 MG 24 hr tablet Take 1 tablet (30 mg total) by mouth daily.  60 tablet  6  . torsemide (DEMADEX) 20 MG tablet Take 1 tablet (20 mg total) by mouth 2 (two) times daily.  60 tablet  6  . warfarin (COUMADIN) 5 MG tablet Take 1 tablet (5 mg total) by mouth daily.  35 tablet  1  . potassium chloride SA (K-DUR,KLOR-CON) 20 MEQ tablet Take 1 tablet (20 mEq total) by mouth daily.  32 tablet  6     PHYSICAL EXAM:  Filed Vitals:   08/13/11 1418  Pulse: 107  Weight: 152 lb (68.947 kg)  SpO2: 97%  BP by doppler 82 --he has not taken his meds today  General:  Well appearing despite low BP. No resp difficulty HEENT: normal Neck: supple. JVP flat. Carotids 2+ bilaterally; no bruits. No lymphadenopathy or thryomegaly appreciated. Cor: PMI laterally displaced. Regular rate & rhythm.3/6 MR + S3 Lungs: clear Abdomen: soft, nontender, non distended. No hepatosplenomegaly. No bruits or masses. Good bowel sounds. Extremities: no cyanosis, clubbing, rash, no edema Neuro: alert & orientedx3, cranial nerves grossly intact. Moves all 4 extremities w/o difficulty. Affect pleasant.  ASSESSMENT & PLAN:

## 2011-08-19 ENCOUNTER — Other Ambulatory Visit: Payer: Self-pay | Admitting: *Deleted

## 2011-08-22 ENCOUNTER — Ambulatory Visit (INDEPENDENT_AMBULATORY_CARE_PROVIDER_SITE_OTHER): Payer: Medicare Other | Admitting: *Deleted

## 2011-08-22 ENCOUNTER — Encounter: Payer: Self-pay | Admitting: Internal Medicine

## 2011-08-22 ENCOUNTER — Other Ambulatory Visit (INDEPENDENT_AMBULATORY_CARE_PROVIDER_SITE_OTHER): Payer: Medicare Other

## 2011-08-22 ENCOUNTER — Telehealth (HOSPITAL_COMMUNITY): Payer: Self-pay | Admitting: *Deleted

## 2011-08-22 ENCOUNTER — Ambulatory Visit (INDEPENDENT_AMBULATORY_CARE_PROVIDER_SITE_OTHER): Payer: Medicare Other

## 2011-08-22 DIAGNOSIS — I5022 Chronic systolic (congestive) heart failure: Secondary | ICD-10-CM

## 2011-08-22 DIAGNOSIS — I8290 Acute embolism and thrombosis of unspecified vein: Secondary | ICD-10-CM

## 2011-08-22 DIAGNOSIS — I749 Embolism and thrombosis of unspecified artery: Secondary | ICD-10-CM

## 2011-08-22 DIAGNOSIS — I5023 Acute on chronic systolic (congestive) heart failure: Secondary | ICD-10-CM

## 2011-08-22 LAB — ICD DEVICE OBSERVATION
BRDY-0002RV: 40 {beats}/min
DEV-0020ICD: NEGATIVE
DEVICE MODEL ICD: 105117
RV LEAD IMPEDENCE ICD: 494 Ohm
RV LEAD THRESHOLD: 1.2 V
TZAT-0001FASTVT: 2
TZAT-0001SLOWVT: 2
TZAT-0013FASTVT: 4
TZAT-0018FASTVT: NEGATIVE
TZAT-0018FASTVT: NEGATIVE
TZON-0003SLOWVT: 400 ms
TZST-0001FASTVT: 4
TZST-0001FASTVT: 6
TZST-0001FASTVT: 7
TZST-0002SLOWVT: NEGATIVE
TZST-0003FASTVT: 21 J
TZST-0003FASTVT: 31 J
TZST-0003FASTVT: 31 J
TZST-0003FASTVT: 31 J
VF: 1

## 2011-08-22 LAB — BASIC METABOLIC PANEL
CO2: 30 mEq/L (ref 19–32)
Chloride: 95 mEq/L — ABNORMAL LOW (ref 96–112)
Creatinine, Ser: 1.9 mg/dL — ABNORMAL HIGH (ref 0.4–1.5)
Potassium: 3.4 mEq/L — ABNORMAL LOW (ref 3.5–5.1)

## 2011-08-22 LAB — POCT INR: INR: 3.1

## 2011-08-22 MED ORDER — WARFARIN SODIUM 5 MG PO TABS: 5.0000 mg | ORAL_TABLET | Freq: Every day | ORAL | Status: DC

## 2011-08-22 NOTE — Telephone Encounter (Signed)
Pt will have repeat labs on Mon 3/4

## 2011-08-22 NOTE — Progress Notes (Signed)
ICD check 

## 2011-08-23 DIAGNOSIS — Z9289 Personal history of other medical treatment: Secondary | ICD-10-CM

## 2011-08-23 HISTORY — PX: LEFT VENTRICULAR ASSIST DEVICE: SHX2679

## 2011-08-23 HISTORY — DX: Personal history of other medical treatment: Z92.89

## 2011-08-26 ENCOUNTER — Other Ambulatory Visit: Payer: Medicare Other

## 2011-08-26 ENCOUNTER — Ambulatory Visit (HOSPITAL_COMMUNITY)
Admission: RE | Admit: 2011-08-26 | Discharge: 2011-08-26 | Disposition: A | Discharge status: Home / Self Care | Payer: Medicare Other | Source: Ambulatory Visit | Attending: Internal Medicine | Admitting: Internal Medicine

## 2011-08-26 VITALS — HR 110 | Wt 148.0 lb

## 2011-08-26 DIAGNOSIS — I059 Rheumatic mitral valve disease, unspecified: Secondary | ICD-10-CM | POA: Insufficient documentation

## 2011-08-26 DIAGNOSIS — N189 Chronic kidney disease, unspecified: Secondary | ICD-10-CM | POA: Insufficient documentation

## 2011-08-26 DIAGNOSIS — Z79899 Other long term (current) drug therapy: Secondary | ICD-10-CM | POA: Insufficient documentation

## 2011-08-26 DIAGNOSIS — Z7901 Long term (current) use of anticoagulants: Secondary | ICD-10-CM | POA: Insufficient documentation

## 2011-08-26 DIAGNOSIS — G4730 Sleep apnea, unspecified: Secondary | ICD-10-CM | POA: Insufficient documentation

## 2011-08-26 DIAGNOSIS — I509 Heart failure, unspecified: Secondary | ICD-10-CM | POA: Insufficient documentation

## 2011-08-26 DIAGNOSIS — Z9119 Patient's noncompliance with other medical treatment and regimen: Secondary | ICD-10-CM | POA: Insufficient documentation

## 2011-08-26 DIAGNOSIS — Z95810 Presence of automatic (implantable) cardiac defibrillator: Secondary | ICD-10-CM | POA: Insufficient documentation

## 2011-08-26 DIAGNOSIS — F17200 Nicotine dependence, unspecified, uncomplicated: Secondary | ICD-10-CM | POA: Insufficient documentation

## 2011-08-26 DIAGNOSIS — I5022 Chronic systolic (congestive) heart failure: Secondary | ICD-10-CM | POA: Insufficient documentation

## 2011-08-26 DIAGNOSIS — Z91199 Patient's noncompliance with other medical treatment and regimen due to unspecified reason: Secondary | ICD-10-CM | POA: Insufficient documentation

## 2011-08-26 DIAGNOSIS — I129 Hypertensive chronic kidney disease with stage 1 through stage 4 chronic kidney disease, or unspecified chronic kidney disease: Secondary | ICD-10-CM | POA: Insufficient documentation

## 2011-08-26 NOTE — Patient Instructions (Signed)
Will have [facility] start milrinone.  Check BMET tomorrow.  Follow up 2 weeks

## 2011-08-26 NOTE — Assessment & Plan Note (Addendum)
Volume status looks ok today but functional status continues to decline and continues to be hypotensive.  He feels significantly worse over the last week.  RHC with low-output. Will plan home milrinone therapy via port-a-cath.  [facility] will be out tomorrow to start infusion of milrinone at 0.25 mcg/kg/min.  I feel he likely needs LVAD soon but CPX doesn't qualify. Will see how he responds to milrinone and d/w Dr. Donata Clay.

## 2011-08-26 NOTE — Progress Notes (Signed)
HPI:  [name] is 51 y/o with severe CHF due to NICM EF 10-15%, severe MR previously on home milrinone. Was previously on transplant list but was taken off as he didn't respond to a page when a heart became available. Also has h/o mild CRI, NSVT s/p ICD and LV thrombus.    Had CPX 9/11: pVO2 23.3  Ve/VCo2 29  RER 1.22 O2 pulse 64%  Repeat echo 11/16/10: EF 10% moderate MR  Admitted earlier this month with low output symptoms of fatigue, abdominal pain and poor appetite. Admit weight 147 pounds. RHC completed 07/08/11 revealed low output physiology.  Milrinone used with diuresis of 7 lbs.    RHC 07/08/11 RA = 7  RV = 45/6/7  PA = 45/31 (35)  PCW = 20  Fick cardiac output/index = 3.06/1.75  PVR = 5.2 Woods  FA sat = 96%  PA sat = 58%, 56%   07/10/11: On milrinone 0.25 mcg/kg/min CO 4.27 and CI 2.36   CPX 2/13: VO2: 23.6 ml/kg/min (65% predicted)  VE/VCO2 30.2 RER 1.27  He is here for a work in visit today.  He has noted increased weight gain, 5 pounds over the weekend.  He feels like his demadex is not working anymore because his urine output is down.  He has abdominal bloating and is not eating much.  He feels sluggish.  +orthopnea/PND.  Occ dizziness.  Chronic SOB with ambulation.  No tobacco since 06/27/11.  Port-a-cath on rt    ROS: All systems negative except as listed in HPI, PMH and Problem List.  Past Medical History  Diagnosis Date  . CHF (congestive heart failure)     EF- 10-15  . Medically noncompliant   . Mitral regurgitation   . Tobacco user   . HTN (hypertension)   . Depression   . AICD (automatic cardioverter/defibrillator) present   . GERD (gastroesophageal reflux disease)   . Substance abuse   . Migraines   . Chronic renal insufficiency   . Syncope   . Sleep apnea     Current Outpatient Prescriptions  Medication Sig Dispense Refill  . digoxin (LANOXIN) 0.125 MG tablet Take 1 tablet (125 mcg total) by mouth daily.  30 tablet  6  . hydrALAZINE (APRESOLINE) 25 MG  tablet Take 12.5 mg by mouth 3 (three) times daily.       . isosorbide mononitrate (IMDUR) 30 MG 24 hr tablet Take 1 tablet (30 mg total) by mouth daily.  60 tablet  6  . potassium chloride SA (K-DUR,KLOR-CON) 20 MEQ tablet Take 20 mEq by mouth as needed.      . torsemide (DEMADEX) 20 MG tablet Take 1 tablet (20 mg total) by mouth 2 (two) times daily.  60 tablet  6  . warfarin (COUMADIN) 5 MG tablet Take 1 tablet (5 mg total) by mouth daily.  35 tablet  1     PHYSICAL EXAM:  Filed Vitals:   08/26/11 1102  Pulse: 110  Weight: 148 lb (67.132 kg)  SpO2: 97%  BP by doppler 78   General:  Well appearing despite low BP. No resp difficulty HEENT: normal Neck: supple. JVP 7-8. Carotids 2+ bilaterally; no bruits. No lymphadenopathy or thryomegaly appreciated. Cor: PMI laterally displaced. Regular rate & rhythm.3/6 MR + S3 Lungs: clear Abdomen: soft, nontender, nondistended.  No hepatosplenomegaly. No bruits or masses. Good bowel sounds. Extremities: no cyanosis, clubbing, rash, no edema Neuro: alert & orientedx3, cranial nerves grossly intact. Moves all 4 extremities w/o difficulty. Affect pleasant.  ASSESSMENT & PLAN:

## 2011-09-04 ENCOUNTER — Ambulatory Visit (HOSPITAL_COMMUNITY)
Admission: RE | Admit: 2011-09-04 | Discharge: 2011-09-04 | Disposition: A | Discharge status: Home / Self Care | Payer: Medicare Other | Source: Ambulatory Visit | Attending: Internal Medicine | Admitting: Internal Medicine

## 2011-09-04 VITALS — BP 88/66 | HR 127 | Wt 143.8 lb

## 2011-09-04 DIAGNOSIS — K2900 Acute gastritis without bleeding: Secondary | ICD-10-CM

## 2011-09-04 DIAGNOSIS — I5022 Chronic systolic (congestive) heart failure: Secondary | ICD-10-CM | POA: Insufficient documentation

## 2011-09-04 MED ORDER — ONDANSETRON HCL 4 MG PO TABS: 4.0000 mg | ORAL_TABLET | Freq: Three times a day (TID) | ORAL | Status: DC | PRN

## 2011-09-04 NOTE — Assessment & Plan Note (Signed)
Volume status looks good today.  Functional status is difficult to assess due to gastritis.  Will have him hold his demadex during this time until he is able to keep fluid/food down.  Will have him follow up in 2 weeks to discuss functional status on milrinone and determine timing of further advanced therapies.

## 2011-09-04 NOTE — Progress Notes (Signed)
HPI:  [name] is 51 y/o with severe CHF due to NICM EF 10-15%, severe MR previously on home milrinone. Was previously on transplant list but was taken off as he didn't respond to a page when a heart became available. Also has h/o mild CRI, NSVT s/p ICD and LV thrombus.    Had CPX 9/11: pVO2 23.3  Ve/VCo2 29  RER 1.22 O2 pulse 64%  Repeat echo 11/16/10: EF 10% moderate MR  Admitted earlier this month with low output symptoms of fatigue, abdominal pain and poor appetite. Admit weight 147 pounds. RHC completed 07/08/11 revealed low output physiology.  Milrinone used with diuresis of 7 lbs.    RHC 07/08/11 RA = 7  RV = 45/6/7  PA = 45/31 (35)  PCW = 20  Fick cardiac output/index = 3.06/1.75  PVR = 5.2 Woods  FA sat = 96%  PA sat = 58%, 56%   07/10/11: On milrinone 0.25 mcg/kg/min CO 4.27 and CI 2.36   CPX 2/13: VO2: 23.6 ml/kg/min (65% predicted)  VE/VCO2 30.2 RER 1.27  Here today for follow up.  +N/V/D over the last 5 days.  He feels poorly.  He was started on milrinone 2 weeks ago.  His weight is down 5 pounds.  He is unable to keep any fluids down.  He feels that his urine output has increased with the milrinone.  Orthopnea/PND improved.  Chronic SOB.  No tobacco since 06/27/11.  Port-a-cath on rt    ROS: All systems negative except as listed in HPI, PMH and Problem List.  Past Medical History  Diagnosis Date  . CHF (congestive heart failure)     EF- 10-15  . Medically noncompliant   . Mitral regurgitation   . Tobacco user   . HTN (hypertension)   . Depression   . AICD (automatic cardioverter/defibrillator) present   . GERD (gastroesophageal reflux disease)   . Substance abuse   . Migraines   . Chronic renal insufficiency   . Syncope   . Sleep apnea     Current Outpatient Prescriptions  Medication Sig Dispense Refill  . digoxin (LANOXIN) 0.125 MG tablet Take 1 tablet (125 mcg total) by mouth daily.  30 tablet  6  . hydrALAZINE (APRESOLINE) 25 MG tablet Take 12.5 mg by mouth 3  (three) times daily.       . isosorbide mononitrate (IMDUR) 30 MG 24 hr tablet Take 1 tablet (30 mg total) by mouth daily.  60 tablet  6  . potassium chloride SA (K-DUR,KLOR-CON) 20 MEQ tablet Take 20 mEq by mouth as needed.      . torsemide (DEMADEX) 20 MG tablet Take 1 tablet (20 mg total) by mouth 2 (two) times daily.  60 tablet  6  . warfarin (COUMADIN) 5 MG tablet Take 1 tablet (5 mg total) by mouth daily.  35 tablet  1     PHYSICAL EXAM:  Filed Vitals:   09/04/11 0951  BP: 88/66  Pulse: 127  Weight: 143 lb 12 oz (65.205 kg)  SpO2: 100%     General:  Well appearing despite low BP. No resp difficulty HEENT: normal Neck: supple. JVP flat. Carotids 2+ bilaterally; no bruits. No lymphadenopathy or thryomegaly appreciated. Cor: PMI laterally displaced. Regular rate & rhythm.3/6 MR + S3 Lungs: clear Abdomen: soft, nontender, nondistended.  No hepatosplenomegaly. No bruits or masses. Good bowel sounds. Extremities: no cyanosis, clubbing, rash, no edema Neuro: alert & orientedx3, cranial nerves grossly intact. Moves all 4 extremities w/o difficulty. Affect pleasant.  ASSESSMENT & PLAN:

## 2011-09-04 NOTE — Assessment & Plan Note (Addendum)
Will give him zofran rx.  If symptoms do not clear in the next several days he is to call, he may need to be brought in for IVF if he is unable to keep anything down.  As above, hold demadex at this time.  Labs tomorrow per [facility].

## 2011-09-04 NOTE — Patient Instructions (Addendum)
Hold demadex for a couple of days while you are not keeping food down.    Follow up 2-3 weeks.  Call if you are not feeling better.

## 2011-09-06 ENCOUNTER — Encounter (HOSPITAL_COMMUNITY): Payer: Medicare Other

## 2011-09-08 ENCOUNTER — Emergency Department (HOSPITAL_COMMUNITY): Payer: Medicare Other

## 2011-09-08 ENCOUNTER — Inpatient Hospital Stay (HOSPITAL_COMMUNITY)
Admission: EM | Admit: 2011-09-08 | Discharge: 2011-09-10 | DRG: 237 | Disposition: A | Discharge status: Other Acute Inpatient Hospital | Payer: Medicare Other | Attending: Cardiology | Admitting: Cardiology

## 2011-09-08 ENCOUNTER — Encounter (HOSPITAL_COMMUNITY): Payer: Self-pay | Admitting: Emergency Medicine

## 2011-09-08 DIAGNOSIS — I472 Ventricular tachycardia, unspecified: Principal | ICD-10-CM | POA: Diagnosis present

## 2011-09-08 DIAGNOSIS — R059 Cough, unspecified: Secondary | ICD-10-CM | POA: Diagnosis present

## 2011-09-08 DIAGNOSIS — Z87891 Personal history of nicotine dependence: Secondary | ICD-10-CM

## 2011-09-08 DIAGNOSIS — I509 Heart failure, unspecified: Secondary | ICD-10-CM | POA: Diagnosis present

## 2011-09-08 DIAGNOSIS — Z7901 Long term (current) use of anticoagulants: Secondary | ICD-10-CM

## 2011-09-08 DIAGNOSIS — I059 Rheumatic mitral valve disease, unspecified: Secondary | ICD-10-CM | POA: Diagnosis present

## 2011-09-08 DIAGNOSIS — R05 Cough: Secondary | ICD-10-CM | POA: Diagnosis present

## 2011-09-08 DIAGNOSIS — R570 Cardiogenic shock: Secondary | ICD-10-CM | POA: Diagnosis present

## 2011-09-08 DIAGNOSIS — I428 Other cardiomyopathies: Secondary | ICD-10-CM | POA: Diagnosis present

## 2011-09-08 DIAGNOSIS — Z4502 Encounter for adjustment and management of automatic implantable cardiac defibrillator: Secondary | ICD-10-CM

## 2011-09-08 DIAGNOSIS — N183 Chronic kidney disease, stage 3 unspecified: Secondary | ICD-10-CM | POA: Diagnosis present

## 2011-09-08 DIAGNOSIS — E876 Hypokalemia: Secondary | ICD-10-CM | POA: Diagnosis present

## 2011-09-08 DIAGNOSIS — I4729 Other ventricular tachycardia: Principal | ICD-10-CM | POA: Diagnosis present

## 2011-09-08 DIAGNOSIS — I5022 Chronic systolic (congestive) heart failure: Secondary | ICD-10-CM | POA: Diagnosis present

## 2011-09-08 LAB — COMPREHENSIVE METABOLIC PANEL
BUN: 15 mg/dL (ref 6–23)
CO2: 22 mEq/L (ref 19–32)
Calcium: 8.9 mg/dL (ref 8.4–10.5)
Creatinine, Ser: 1.29 mg/dL (ref 0.50–1.35)
GFR calc Af Amer: 74 mL/min — ABNORMAL LOW (ref >90)
GFR calc non Af Amer: 64 mL/min — ABNORMAL LOW (ref >90)
Glucose, Bld: 111 mg/dL — ABNORMAL HIGH (ref 70–99)
Total Bilirubin: 0.6 mg/dL (ref 0.3–1.2)

## 2011-09-08 LAB — CBC
HCT: 37.8 % — ABNORMAL LOW (ref 39.0–52.0)
Hemoglobin: 13.5 g/dL (ref 13.0–17.0)
MCV: 83.3 fL (ref 78.0–100.0)
RBC: 4.54 MIL/uL (ref 4.22–5.81)
RDW: 13.6 % (ref 11.5–15.5)
WBC: 7 10*3/uL (ref 4.0–10.5)

## 2011-09-08 LAB — DIFFERENTIAL
Eosinophils Relative: 1 % (ref 0–5)
Lymphocytes Relative: 23 % (ref 12–46)
Lymphs Abs: 1.6 10*3/uL (ref 0.7–4.0)
Monocytes Absolute: 0.6 10*3/uL (ref 0.1–1.0)
Monocytes Relative: 9 % (ref 3–12)

## 2011-09-08 MED ORDER — HYDROCOD POLST-CHLORPHEN POLST 10-8 MG/5ML PO LQCR
5.0000 mL | Freq: Once | ORAL | Status: AC
  Administered 2011-09-08: 5 mL via ORAL
  Filled 2011-09-08: qty 5

## 2011-09-08 NOTE — ED Provider Notes (Cosign Needed)
History     CSN: [id number]  Arrival date & time 09/08/11  2126   None     Chief Complaint  Patient presents with  . Pacemaker Problem    (Consider location/radiation/quality/duration/timing/severity/associated sxs/prior treatment) HPI Comments: Patient is a 51 year old man with severe cardiomyopathy, on a continuous amrinone infusion.  He says that he has had a cold recently and has been coughing. When he coughs his heart starts racing and it makes him short of breath. He had a paroxysm of cough, and his defibrillator went off. Therefore EMS was called and he was brought to [facility] ED for evaluation.  Patient is a 51 y.o. male presenting with shortness of breath.  Shortness of Breath  The current episode started today. The onset was sudden. The problem occurs rarely. Progression since onset: He had one defibrillator shock. The problem is severe. The symptoms are relieved by nothing. The symptoms are aggravated by nothing. Associated symptoms include cough and shortness of breath. Pertinent negatives include no fever. The cough is non-productive and paroxysmal. Nothing relieves the cough. Nothing worsens the cough. He has not inhaled smoke recently. He has had prior hospitalizations. Past medical history comments: End-stage CHF.Marland Kitchen There were no sick contacts. Recently, medical care has been given by a specialist. Services received include medications given (He was recently placed on amrinone).    Past Medical History  Diagnosis Date  . CHF (congestive heart failure)     EF- 10-15  . Medically noncompliant   . Mitral regurgitation   . Tobacco user   . HTN (hypertension)   . Depression   . AICD (automatic cardioverter/defibrillator) present   . GERD (gastroesophageal reflux disease)   . Substance abuse   . Migraines   . Chronic renal insufficiency   . Syncope   . Sleep apnea     Past Surgical History  Procedure Date  . Cardiac defibrillator placement     Family  History  Problem Relation Age of Onset  . Coronary artery disease Neg Hx     History  Substance Use Topics  . Smoking status: Former Smoker -- 0.2 packs/day for 20 years    Types: Cigarettes    Quit date: 06/29/2011  . Smokeless tobacco: Former Neurosurgeon    Quit date: 06/29/2011  . Alcohol Use: No      Review of Systems  Constitutional: Negative.  Negative for fever and chills.  HENT: Negative.   Eyes: Negative.   Respiratory: Positive for cough and shortness of breath.   Cardiovascular:       Defibrillator fired.  Gastrointestinal: Negative.   Genitourinary: Negative.   Musculoskeletal: Negative.   Neurological: Negative.   Psychiatric/Behavioral: Negative.     Allergies  Review of patient's allergies indicates no known allergies.  Home Medications   Current Outpatient Rx  Name Route Sig Dispense Refill  . ALLOPURINOL 100 MG PO TABS Oral Take 100 mg by mouth daily. For gout    . DIGOXIN 0.125 MG PO TABS Oral Take 125 mcg by mouth daily.    Marland Kitchen HYDRALAZINE HCL 25 MG PO TABS Oral Take 12.5 mg by mouth 3 (three) times daily.     Marland Kitchen POTASSIUM CHLORIDE CRYS ER 20 MEQ PO TBCR Oral Take 20 mEq by mouth as needed.    Marland Kitchen MILRINONE 200 MCG/ML INFUSION IN 0.9% NACL Intravenous Inject 0.25 mcg/kg/min into the vein continuous.    . TORSEMIDE 20 MG PO TABS Oral Take 20 mg by mouth 2 (two) times daily.    Marland Kitchen  WARFARIN SODIUM 5 MG PO TABS Oral Take 5 mg by mouth at bedtime.      BP 101/79  Pulse 139  Temp(Src) 98.3 F (36.8 C) (Oral)  Resp 20  SpO2 97%  Physical Exam  Nursing note and vitals reviewed. Constitutional: He is oriented to person, place, and time.       Patient is a middle-aged man who is having paroxysms of cough.  HENT:  Head: Normocephalic and atraumatic.  Right Ear: External ear normal.  Left Ear: External ear normal.  Mouth/Throat: Oropharynx is clear and moist.  Eyes: Conjunctivae and EOM are normal. Pupils are equal, round, and reactive to light.  Neck: Normal  range of motion. Neck supple.  Cardiovascular: Normal rate and normal heart sounds.        He has a resting tachycardia of about 130  Pulmonary/Chest: Rales: he has rales at the right base.  Abdominal: Soft. Bowel sounds are normal.  Musculoskeletal: Normal range of motion. He exhibits no edema.  Neurological: He is alert and oriented to person, place, and time.       No sensory or motor deficit.  Skin: Skin is warm and dry.  Psychiatric: He has a normal mood and affect. His behavior is normal.    ED Course  CRITICAL CARE Performed by: Osvaldo Human Authorized by: Osvaldo Human Total critical care time: 30 minutes Critical care was necessary to treat or prevent imminent or life-threatening deterioration of the following conditions: cardiac failure (Evaluation of pt with AICD discharge who was having episodes of ventricular tachycardia, in a background of end-stage CHF.). Critical care was time spent personally by me on the following activities: discussions with consultants, development of treatment plan with patient or surrogate, evaluation of patient's response to treatment, examination of patient, obtaining history from patient or surrogate, ordering and performing treatments and interventions, ordering and review of laboratory studies, ordering and review of radiographic studies, re-evaluation of patient's condition and review of old charts.   (including critical care time)  Labs Reviewed - No data to display No results found.  Date: 09/08/2011  Rate: 136  Rhythm: sinus tachycardia  QRS Axis: normal  Intervals: QT prolonged QRS:  Left ventricular hypertrophy;  Poor R wave progression in precordial leads suggests old AMI.  ST/T Wave abnormalities: repolarization from LVH  Conduction Disutrbances:none  Narrative Interpretation: Abnormal EKG.   Old EKG Reviewed: unchanged   12:50 AM Patient was seen and had physical examination. He was ordered to have Tussionex cough  syrup to help his cough.  Lab workup showed CHF.  Interrogation of his AICD by the [employer] rep showed multiple episodes of ventricular tachycardia today, culminating in the episode that triggered the AICD to defibrillate him.  Call to [facility] to admit pt.  1:12 AM Case discussed with Dr. Margo Aye, who will see and admit pt.  1. Defibrillator discharge   2. Ventricular tachycardia   3. Congestive heart failure           Carleene Cooper III, MD 09/09/11 [phone]

## 2011-09-08 NOTE — ED Notes (Signed)
Pt  C/o SOB and vomiting has been since Tuesday. when he coughs and moves.  Was diagnosed with Arna Medici virus last week.  Pt reports that his defibrillator went off.  Denies CP.

## 2011-09-08 NOTE — ED Notes (Signed)
Pt has a medication pump attached to port (Milrone)

## 2011-09-09 ENCOUNTER — Encounter (HOSPITAL_COMMUNITY): Payer: Self-pay | Admitting: Internal Medicine

## 2011-09-09 ENCOUNTER — Encounter (HOSPITAL_COMMUNITY)
Admission: EM | Disposition: A | Discharge status: Other Acute Inpatient Hospital | Payer: Self-pay | Source: Home / Self Care | Attending: Cardiology

## 2011-09-09 DIAGNOSIS — R570 Cardiogenic shock: Secondary | ICD-10-CM

## 2011-09-09 DIAGNOSIS — I509 Heart failure, unspecified: Secondary | ICD-10-CM

## 2011-09-09 HISTORY — PX: RIGHT HEART CATHETERIZATION: SHX5447

## 2011-09-09 HISTORY — PX: INTRA-AORTIC BALLOON PUMP INSERTION: SHX5475

## 2011-09-09 LAB — CARBOXYHEMOGLOBIN
Carboxyhemoglobin: 0.9 % (ref 0.5–1.5)
Methemoglobin: 1.5 % (ref 0.0–1.5)
Methemoglobin: 0.5 % (ref 0.0–1.5)
O2 Saturation: 51.7 %
O2 Saturation: 26.7 %
Total hemoglobin: 12.9 g/dL — ABNORMAL LOW (ref 13.5–18.0)
Total hemoglobin: 13.8 g/dL (ref 13.5–18.0)

## 2011-09-09 LAB — URINALYSIS, ROUTINE W REFLEX MICROSCOPIC
Bilirubin Urine: NEGATIVE
Leukocytes, UA: NEGATIVE
Nitrite: NEGATIVE
Specific Gravity, Urine: 1.007 (ref 1.005–1.030)
pH: 5 (ref 5.0–8.0)

## 2011-09-09 LAB — POCT I-STAT 3, ART BLOOD GAS (G3+)
Acid-base deficit: 1 mmol/L (ref 0.0–2.0)
Bicarbonate: 23.7 mEq/L (ref 20.0–24.0)
TCO2: 25 mmol/L (ref 0–100)

## 2011-09-09 LAB — POCT I-STAT 3, VENOUS BLOOD GAS (G3P V)
Bicarbonate: 25.4 mEq/L — ABNORMAL HIGH (ref 20.0–24.0)
TCO2: 27 mmol/L (ref 0–100)
pH, Ven: 7.368 — ABNORMAL HIGH (ref 7.250–7.300)
pH, Ven: 7.386 — ABNORMAL HIGH (ref 7.250–7.300)
pO2, Ven: 24 mmHg — CL (ref 30.0–45.0)
pO2, Ven: 29 mmHg — CL (ref 30.0–45.0)

## 2011-09-09 LAB — BASIC METABOLIC PANEL
CO2: 28 mEq/L (ref 19–32)
Chloride: 97 mEq/L (ref 96–112)
Potassium: 4.3 mEq/L (ref 3.5–5.1)
Sodium: 137 mEq/L (ref 135–145)

## 2011-09-09 LAB — PROTIME-INR
INR: 2.1 — ABNORMAL HIGH (ref 0.00–1.49)
Prothrombin Time: 59.9 seconds — ABNORMAL HIGH (ref 11.6–15.2)
Prothrombin Time: 23.9 seconds — ABNORMAL HIGH (ref 11.6–15.2)

## 2011-09-09 SURGERY — RIGHT HEART CATH
Anesthesia: LOCAL

## 2011-09-09 MED ORDER — MILRINONE LACTATE 1 MG/ML IV SOLN
0.3750 ug/kg/min | INTRAVENOUS | Status: DC
  Administered 2011-09-09: 0.375 ug/kg/min via INTRAVENOUS
  Administered 2011-09-09: 0.25 ug/kg/min via INTRAVENOUS
  Filled 2011-09-09 (×3): qty 50

## 2011-09-09 MED ORDER — HEPARIN (PORCINE) IN NACL 2-0.9 UNIT/ML-% IJ SOLN
INTRAMUSCULAR | Status: AC
  Filled 2011-09-09: qty 1000

## 2011-09-09 MED ORDER — SODIUM CHLORIDE 0.9 % IV SOLN: 250.0000 mL | INTRAVENOUS | Status: DC

## 2011-09-09 MED ORDER — VITAMIN K1 10 MG/ML IJ SOLN
2.5000 mg | Freq: Once | INTRAVENOUS | Status: AC
  Administered 2011-09-09: 2.5 mg via INTRAVENOUS
  Filled 2011-09-09: qty 0.25

## 2011-09-09 MED ORDER — HEPARIN (PORCINE) IN NACL 100-0.45 UNIT/ML-% IJ SOLN
1000.0000 [IU]/h | INTRAMUSCULAR | Status: DC
  Filled 2011-09-09 (×3): qty 250

## 2011-09-09 MED ORDER — DIGOXIN 125 MCG PO TABS
125.0000 ug | ORAL_TABLET | Freq: Every day | ORAL | Status: DC
  Administered 2011-09-09: 125 ug via ORAL
  Filled 2011-09-09: qty 1

## 2011-09-09 MED ORDER — MIDAZOLAM HCL 2 MG/2ML IJ SOLN
INTRAMUSCULAR | Status: AC
  Filled 2011-09-09: qty 2

## 2011-09-09 MED ORDER — WARFARIN SODIUM 5 MG PO TABS
5.0000 mg | ORAL_TABLET | Freq: Every day | ORAL | Status: DC
  Filled 2011-09-09: qty 1

## 2011-09-09 MED ORDER — ALLOPURINOL 100 MG PO TABS
100.0000 mg | ORAL_TABLET | Freq: Every day | ORAL | Status: DC
  Administered 2011-09-09: 100 mg via ORAL
  Filled 2011-09-09: qty 1

## 2011-09-09 MED ORDER — SODIUM CHLORIDE 0.9 % IJ SOLN: 3.0000 mL | INTRAMUSCULAR | Status: DC | PRN

## 2011-09-09 MED ORDER — ACETAMINOPHEN 325 MG PO TABS: 650.0000 mg | ORAL_TABLET | ORAL | Status: DC | PRN

## 2011-09-09 MED ORDER — POTASSIUM CHLORIDE CRYS ER 20 MEQ PO TBCR
40.0000 meq | EXTENDED_RELEASE_TABLET | Freq: Once | ORAL | Status: AC
  Administered 2011-09-09: 40 meq via ORAL
  Filled 2011-09-09: qty 2

## 2011-09-09 MED ORDER — MILRINONE IN DEXTROSE 200-5 MCG/ML-% IV SOLN
0.3750 ug/kg/min | INTRAVENOUS | Status: DC
  Filled 2011-09-09: qty 100

## 2011-09-09 MED ORDER — LIDOCAINE HCL (PF) 1 % IJ SOLN
INTRAMUSCULAR | Status: AC
  Filled 2011-09-09: qty 30

## 2011-09-09 MED ORDER — ONDANSETRON HCL 4 MG/2ML IJ SOLN
4.0000 mg | Freq: Four times a day (QID) | INTRAMUSCULAR | Status: DC | PRN
  Administered 2011-09-09: 4 mg via INTRAVENOUS
  Filled 2011-09-09: qty 2

## 2011-09-09 MED ORDER — HEPARIN (PORCINE) IN NACL 100-0.45 UNIT/ML-% IJ SOLN
650.0000 [IU]/h | INTRAMUSCULAR | Status: DC
  Filled 2011-09-09: qty 250

## 2011-09-09 MED ORDER — WARFARIN - PHYSICIAN DOSING INPATIENT: Freq: Every day | Status: DC

## 2011-09-09 MED ORDER — ASPIRIN EC 81 MG PO TBEC
81.0000 mg | DELAYED_RELEASE_TABLET | Freq: Every day | ORAL | Status: DC
  Filled 2011-09-09 (×2): qty 1

## 2011-09-09 MED ORDER — MAGNESIUM SULFATE 50 % IJ SOLN
3.0000 g | Freq: Once | INTRAMUSCULAR | Status: DC
  Filled 2011-09-09: qty 6

## 2011-09-09 MED ORDER — FUROSEMIDE 10 MG/ML IJ SOLN
80.0000 mg | Freq: Once | INTRAMUSCULAR | Status: AC
  Administered 2011-09-09: 80 mg via INTRAVENOUS

## 2011-09-09 MED ORDER — ONDANSETRON HCL 4 MG/2ML IJ SOLN: 4.0000 mg | Freq: Four times a day (QID) | INTRAMUSCULAR | Status: DC | PRN

## 2011-09-09 MED ORDER — NOREPINEPHRINE BITARTRATE 1 MG/ML IJ SOLN
2.0000 ug/min | INTRAVENOUS | Status: DC
  Administered 2011-09-09: 2 ug/min via INTRAVENOUS
  Administered 2011-09-09: 10 ug/min via INTRAVENOUS
  Filled 2011-09-09 (×3): qty 4

## 2011-09-09 MED ORDER — POTASSIUM CHLORIDE CRYS ER 20 MEQ PO TBCR
40.0000 meq | EXTENDED_RELEASE_TABLET | Freq: Every day | ORAL | Status: DC
  Administered 2011-09-09: 40 meq via ORAL
  Filled 2011-09-09: qty 2

## 2011-09-09 MED ORDER — HEPARIN (PORCINE) IN NACL 100-0.45 UNIT/ML-% IJ SOLN: 800.0000 [IU]/h | INTRAMUSCULAR | Status: DC

## 2011-09-09 MED ORDER — WARFARIN - PHARMACIST DOSING INPATIENT: Freq: Every day | Status: DC

## 2011-09-09 MED ORDER — SODIUM CHLORIDE 0.9 % IJ SOLN: 3.0000 mL | Freq: Two times a day (BID) | INTRAMUSCULAR | Status: DC

## 2011-09-09 MED ORDER — FUROSEMIDE 10 MG/ML IJ SOLN
80.0000 mg | Freq: Two times a day (BID) | INTRAMUSCULAR | Status: DC
  Administered 2011-09-09: 80 mg via INTRAVENOUS
  Filled 2011-09-09 (×2): qty 8

## 2011-09-09 MED ORDER — POTASSIUM CHLORIDE CRYS ER 20 MEQ PO TBCR
20.0000 meq | EXTENDED_RELEASE_TABLET | Freq: Two times a day (BID) | ORAL | Status: DC
  Administered 2011-09-09: 20 meq via ORAL
  Filled 2011-09-09: qty 1

## 2011-09-09 MED ORDER — PHYTONADIONE 5 MG PO TABS
2.5000 mg | ORAL_TABLET | Freq: Once | ORAL | Status: AC
  Administered 2011-09-09: 2.5 mg via ORAL
  Filled 2011-09-09: qty 1

## 2011-09-09 MED ORDER — METOPROLOL SUCCINATE ER 25 MG PO TB24
25.0000 mg | ORAL_TABLET | Freq: Every day | ORAL | Status: DC
  Administered 2011-09-09: 25 mg via ORAL
  Filled 2011-09-09 (×2): qty 1

## 2011-09-09 MED ORDER — SODIUM CHLORIDE 0.9 % IV SOLN: 250.0000 mL | INTRAVENOUS | Status: DC | PRN

## 2011-09-09 MED ORDER — SODIUM CHLORIDE 0.9 % IJ SOLN
3.0000 mL | Freq: Two times a day (BID) | INTRAMUSCULAR | Status: DC
  Administered 2011-09-09 (×2): 3 mL via INTRAVENOUS

## 2011-09-09 MED ORDER — FUROSEMIDE 10 MG/ML IJ SOLN
INTRAMUSCULAR | Status: AC
  Filled 2011-09-09: qty 8

## 2011-09-09 MED ORDER — FUROSEMIDE 10 MG/ML IJ SOLN
80.0000 mg | Freq: Once | INTRAMUSCULAR | Status: DC
  Filled 2011-09-09: qty 8

## 2011-09-09 MED ORDER — MAGNESIUM SULFATE 50 % IJ SOLN
3.0000 g | Freq: Once | INTRAVENOUS | Status: AC
  Administered 2011-09-09: 3 g via INTRAVENOUS
  Filled 2011-09-09: qty 6

## 2011-09-09 NOTE — H&P (Signed)
NAMELIBORIO, SACCENTE NO.:  [id number]  MEDICAL RECORD NO.:  [id number]  LOCATION:  2313                         FACILITY:  [facility]  PHYSICIAN:  Natasha Bence, MD       DATE OF BIRTH:  July 10, 1960  DATE OF ADMISSION:  09/08/2011 DATE OF DISCHARGE:                             HISTORY & PHYSICAL   PRIMARY CARDIOLOGIST:  Bevelyn Buckles. Bensimhon, MD  CHIEF COMPLAINT:  Coughing and ICD firing.  HISTORY OF PRESENT ILLNESS:  Mr. 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  He was previously on a transplant list.  He reports a several-day history of coughing. Previously, he had been seen in clinic several times for what was felt to be a viral gastroenteritis.  He was also felt to be somewhat volume depleted and his Demadex has been held.  He reports since holding his Demadex, he has not had any increasing shortness of breath, PND, or orthopnea.  He says his weight is down from a dry weight of about 145- 148 pounds.  Currently, his weight is approximately 140 pounds.  He denies any fevers, chills, or sweats.  His cough is productive of clear sputum.  He reports he has been coughing incessantly and there is no predominance of position, whether he is supine or standing.  Today, he was coughing until his ICD fired.  In emergency department, the representative interrogated his device and felt this was related to ventricular tachycardia.  He said he did not have any lightheadedness or dizziness or feel poorly around the episode.  He just says he felt back from coughing.  He denies any chest discomfort, lightheadedness, or acute shortness of breath during this episode.  No nausea, vomiting, or diaphoresis.  He has not had any other problems.  Otherwise, a complete review of systems was performed and was negative except for as stated above.  PAST MEDICAL HISTORY: 1.  Nonischemic cardiomyopathy, EF 10-15%. 2. Mitral regurgitation. 3. Hypertension. 4. Mild chronic renal insufficiency.  PAST SURGICAL HISTORY:  He has had a history of ICD.  FAMILY HISTORY:  No significant history of coronary artery disease.  SOCIAL HISTORY:  He is a former smoker; he has quit.  He does not use any illicit drugs.  ALLERGIES:  He has no known drug allergies.  HOME MEDICATIONS: 1. Allopurinol 100 mg p.o. daily. 2. Digoxin 0.125 mg p.o. daily. 3. Hydralazine 12.5 mg t.i.d. 4. K-Dur 20 mEq p.o. p.r.n. 5. Milrinone infusion at 0.25 mcg/kg/min. 6. Demadex 20 mg p.o. b.i.d., although he is not taking this     currently. 7. Coumadin 5 mg p.o. nightly.  PHYSICAL EXAMINATION:  VITAL SIGNS:  Afebrile with temperature of 98.3, pulse of 123 and irregular, respiratory rate of 19, blood pressure 93/67, O2 sats 93% on room air. GENERAL:  He is a well-developed, well-nourished, black male, in no apparent distress. HEENT:  Eyes, he has anicteric sclerae. NECK:  Jugular venous pressure appears normal.  He has no carotid bruits. LUNGS:  Clear auscultation bilaterally. CARDIOVASCULAR:  He is tachycardic, irregular, with a faint  systolic murmur. ABDOMEN:  Soft, nontender, nondistended.  No ascites. EXTREMITIES:  Warm with no edema.  He has symmetrical pulses throughout. NEUROLOGIC:  Grossly afocal. SKIN:  No rashes or ulcers.  LABORATORY DATA:  Sodium 132, potassium 3.5, chloride of 96, bicarb of 22, BUN of 15, creatinine of 1.29, calcium 8.9, albumin 2.9.  ProBNP of 5981.  White blood cell count of 7, hematocrit of 38, platelet count of 352.  He had a normal differential on his white blood cell count.  Chest x-ray showed no acute infiltrate or effusions.  His ICD was interrogated by [employer].  They reported a 21-joule shock appropriately for ventricular tachycardia with a type 2 break.  He has had a total of 109 nonsustained VT episodes with 6 VT-1  monitor-only episodes.  They did not make any changes to the device at that point; however, on looking at the tracings, tachycardia does appear to be 101, AV conduction with a rate of approximately 250 beats per minute.  EKG shows sinus tachycardia; heart rate 141 beats per minute.  He has left ventricular hypertrophy with secondary repolarization abnormalities and left atrial enlargement.  Not much change from old EKG.  IMPRESSION/PLAN:  This is a 51 year old black male with a nonischemic cardiomyopathy, ejection fraction 10-15%, who presents after an ICD firing, unsure if this was really due to ventricular tachycardia or supraventricular tachycardia.  He has been persistently tachycardic.  Of note, he is off of his beta-blockade due to hypotension in the past. They have had trouble getting his heart failure medications on board, given his relative hypotension.  I will continue his digoxin 0.125 mg p.o. daily and add Toprol-XL 25 mg p.o. daily.  We will hold off on this presently and at this point, see how his blood pressure responds.  He will have EP take a look at the strips in the morning from his ICD interrogation.  He appears fairly euvolemic on exam, although his BNP is significantly elevated.  I do not see a previous one for comparison at this point.  He does report his weight is down significantly below his baseline.  The chest x-ray does not show an acute pulmonary edema.  He is also supposed to be on Coumadin for LV thrombus.  We will check PT/INR.  We will keep his potassium above 4.          ______________________________ Natasha Bence, MD     MH/MEDQ  D:  09/09/2011  T:  09/09/2011  Job:  [id number]

## 2011-09-09 NOTE — Progress Notes (Signed)
CRITICAL VALUE ALERT  Critical value received:  INR 6.80  Date of notification:  09/09/2011  Time of notification:  06:05  Critical value read back:yes  Nurse who received alert:  Fatima Sanger, RN  MD notified (1st page):  Dr. Margo Aye  Time of first page:  06:15  MD notified (2nd page):  Time of second page:  Responding MD:  Berton Mount  Time MD responded:  06:17

## 2011-09-09 NOTE — Interval H&P Note (Signed)
History and Physical Interval Note:  09/09/2011 5:54 PM  [name]  has presented today for surgery, with the diagnosis of iabp swan insertion  The various methods of treatment have been discussed with the patient and family. After consideration of risks, benefits and other options for treatment, the patient has consented to  Procedure(s) (LRB): RIGHT HEART CATH (N/A) INTRA-AORTIC BALLOON PUMP INSERTION () as a surgical intervention .  The patients' history has been reviewed, patient examined, no change in status, stable for surgery.  I have reviewed the patients' chart and labs.  Questions were answered to the patient's satisfaction.     Fredrica Capano

## 2011-09-09 NOTE — Progress Notes (Signed)
ANTICOAGULATION CONSULT NOTE - Initial Consult  Pharmacy Consult for Coumadin Indication: LV thrombus  No Known Allergies  Patient Measurements: Height: 5\' 4"  (162.6 cm) Weight: 140 lb 6.9 oz (63.7 kg) IBW/kg (Calculated) : 59.2   Vital Signs: Temp: 96.6 F (35.9 C) (03/18 0358) Temp src: Axillary (03/18 0358) BP: 96/56 mmHg (03/18 0600) Pulse Rate: 117  (03/18 0600)  Labs:  Basename 09/09/11 0445 09/08/11 2235  HGB -- 13.5  HCT -- 37.8*  PLT -- 352  APTT -- --  LABPROT 59.9* --  INR 6.80* --  HEPARINUNFRC -- --  CREATININE -- 1.29  CKTOTAL -- --  CKMB -- --  TROPONINI -- --   Estimated Creatinine Clearance: 58.6 ml/min (by C-G formula based on Cr of 1.29).  Medical History: Past Medical History  Diagnosis Date  . CHF (congestive heart failure)     EF- 10-15  . Medically noncompliant   . Mitral regurgitation   . Tobacco user   . HTN (hypertension)   . Depression   . AICD (automatic cardioverter/defibrillator) present   . GERD (gastroesophageal reflux disease)   . Substance abuse   . Migraines   . Chronic renal insufficiency   . Syncope   . Sleep apnea     Medications:  Prescriptions prior to admission  Medication Sig Dispense Refill  . allopurinol (ZYLOPRIM) 100 MG tablet Take 100 mg by mouth daily. For gout      . digoxin (LANOXIN) 0.125 MG tablet Take 125 mcg by mouth daily.      . hydrALAZINE (APRESOLINE) 25 MG tablet Take 12.5 mg by mouth 3 (three) times daily.       . potassium chloride SA (K-DUR,KLOR-CON) 20 MEQ tablet Take 20 mEq by mouth as needed.      . sodium chloride 0.9 % SOLN with milrinone 1 MG/ML SOLN 200 mcg/mL Inject 0.25 mcg/kg/min into the vein continuous.      . torsemide (DEMADEX) 20 MG tablet Take 20 mg by mouth 2 (two) times daily.      Marland Kitchen warfarin (COUMADIN) 5 MG tablet Take 5 mg by mouth at bedtime.       Scheduled:    . allopurinol  100 mg Oral Daily  . aspirin EC  81 mg Oral Daily  . chlorpheniramine-HYDROcodone  5 mL  Oral Once  . digoxin  125 mcg Oral Daily  . metoprolol succinate  25 mg Oral Daily  . potassium chloride  40 mEq Oral Once  . sodium chloride  3 mL Intravenous Q12H  . DISCONTD: warfarin  5 mg Oral QHS  . DISCONTD: Warfarin - Physician Dosing Inpatient   Does not apply q1800    Assessment/Plan: 51yo male admitted s/p ICD firing for VT vs SVT, on Coumadin PTA for LV thrombus and with supratherapeutic INR.  Will hold Coumadin for now and watch for signs of bleeding, consider reversal if bleeding occurs, consider resuming Coumadin per INR.  Goal of Therapy:  INR 2-3   Colleen Can PharmD BCPS 09/09/2011,6:46 AM

## 2011-09-09 NOTE — Progress Notes (Signed)
ANTICOAGULATION CONSULT NOTE - Initial Consult  Pharmacy Consult for Heparin Indication: hx LV thrombus/IABP in place  No Known Allergies  Patient Measurements: Height: 5\' 4"  (162.6 cm) Weight: 140 lb 6.9 oz (63.7 kg) IBW/kg (Calculated) : 59.2   Vital Signs: Temp: 100.2 F (37.9 C) (03/18 1845) Temp src: Core (Comment) (03/18 1845) BP: 114/72 mmHg (03/18 1845) Pulse Rate: 121  (03/18 1845)  Labs:  Basename 09/09/11 1400 09/09/11 0445 09/08/11 2235  HGB -- -- 13.5  HCT -- -- 37.8*  PLT -- -- 352  APTT -- -- --  LABPROT 23.9* 59.9* --  INR 2.10* 6.80* --  HEPARINUNFRC -- -- --  CREATININE -- -- 1.29  CKTOTAL -- -- --  CKMB -- -- --  TROPONINI -- -- --   Estimated Creatinine Clearance: 58.6 ml/min (by C-G formula based on Cr of 1.29).  Medical History: Past Medical History  Diagnosis Date  . CHF (congestive heart failure)     EF- 10-15  . Medically noncompliant   . Mitral regurgitation   . Tobacco user   . HTN (hypertension)   . Depression   . AICD (automatic cardioverter/defibrillator) present   . GERD (gastroesophageal reflux disease)   . Substance abuse   . Migraines   . Chronic renal insufficiency   . Syncope   . Sleep apnea     Medications:  Prescriptions prior to admission  Medication Sig Dispense Refill  . allopurinol (ZYLOPRIM) 100 MG tablet Take 100 mg by mouth daily. For gout      . digoxin (LANOXIN) 0.125 MG tablet Take 125 mcg by mouth daily.      . hydrALAZINE (APRESOLINE) 25 MG tablet Take 12.5 mg by mouth 3 (three) times daily.       . potassium chloride SA (K-DUR,KLOR-CON) 20 MEQ tablet Take 20 mEq by mouth as needed.      . sodium chloride 0.9 % SOLN with milrinone 1 MG/ML SOLN 200 mcg/mL Inject 0.25 mcg/kg/min into the vein continuous.      . torsemide (DEMADEX) 20 MG tablet Take 20 mg by mouth 2 (two) times daily.      Marland Kitchen warfarin (COUMADIN) 5 MG tablet Take 5 mg by mouth at bedtime.       Scheduled:     . allopurinol  100 mg Oral  Daily  . chlorpheniramine-HYDROcodone  5 mL Oral Once  . digoxin  125 mcg Oral Daily  . furosemide      . furosemide  80 mg Intravenous Once  . furosemide  80 mg Intravenous BID  . heparin      . heparin      . lidocaine      . magnesium sulfate 1 - 4 g bolus IVPB  3 g Intravenous Once  . midazolam      . phytonadione (VITAMIN K) IV  2.5 mg Intravenous Once  . phytonadione  2.5 mg Oral Once  . potassium chloride  20 mEq Oral BID  . potassium chloride  40 mEq Oral Once  . potassium chloride  40 mEq Oral Daily  . sodium chloride  3 mL Intravenous Q12H  . sodium chloride  3 mL Intravenous Q12H  . DISCONTD: aspirin EC  81 mg Oral Daily  . DISCONTD: furosemide  80 mg Intravenous Once  . DISCONTD: magnesium sulfate  3 g Intravenous Once  . DISCONTD: metoprolol succinate  25 mg Oral Daily  . DISCONTD: warfarin  5 mg Oral QHS  . DISCONTD: Warfarin - Pharmacist  Dosing Inpatient   Does not apply q1800  . DISCONTD: Warfarin - Physician Dosing Inpatient   Does not apply q1800    Assessment/Plan: 51yo male admitted s/p ICD firing for VT vs SVT, on Coumadin PTA for LV thrombus and with supratherapeutic INR this AM.  Received Vit K 2.5mg  IV at 1000 and Vit K 2.5mg  po at 1400 and INR at 1400 was 2.1.  Began heparin in cath lab, now awaiting transfer to [facility] for LVAD and to continue heparin.  Currently running at ~12.6 units/kg/hr, will decrease to ~10 units/kg/hr to try to aim for goal heparin level 0.3-0.5 (since IABP and LV thrombus).    Goal of Therapy:  Heparin level 0.3-0.5  Plan: 1.  Decrease heparin to 650 units/hr 2.  Check heparin level and INR in 6 hours  Rolland Porter, [place].D., BCPS Clinical Pharmacist Pager: [phone]

## 2011-09-09 NOTE — Progress Notes (Addendum)
   CARE MANAGEMENT NOTE 09/09/2011  Patient:  [name], [name]   Account Number:  [id number]  Date Initiated:  09/09/2011  Documentation initiated by:  GRAVES-BIGELOW,Danell Vazquez  Subjective/Objective Assessment:   Pt admitted for Coughing and ICD firing. EF of 10-15%. Severe MR on home milrinone. Pt is active with [facility] for RN services. Per MD notes plan for [facility].     Action/Plan:   Anticipated DC Date:  09/14/2011   Anticipated DC Plan:  HOME W HOME HEALTH SERVICES      DC Planning Services  CM consult      Choice offered to / List presented to:             Status of service:  In process, will continue to follow Medicare Important Message given?   (If response is "NO", the following Medicare IM given date fields will be blank) Date Medicare IM given:   Date Additional Medicare IM given:    Discharge Disposition:    Per UR Regulation:    If discussed at Long Length of Stay Meetings, dates discussed:    Comments:  09-09-11 1445 Tomi Bamberger, RN,BSN [phone] CM will continue to monitor for d/c disposition. If plan is for home with [facility] services when stable- MD please write for resumption orders for [facility] services. Thanks

## 2011-09-09 NOTE — H&P (View-Only) (Signed)
Advanced Heart Failure Rounding Note   Subjective:    [name] is 51 y/o with severe CHF due to NICM EF 10-15%, severe MR on home milrinone. Was previously on transplant list but was taken off as he didn't respond to a page when a heart became available. Also has h/o mild CRI, NSVT s/p ICD and LV thrombus.    RHC 07/08/11 RA = 7  RV = 45/6/7  PA = 45/31 (35)  PCW = 20  Fick cardiac output/index = 3.06/1.75  PVR = 5.2 Woods  FA sat = 96%  PA sat = 58%, 56%   07/10/11: On milrinone 0.25 mcg/kg/min CO 4.27 and CI 2.36  CPX 2/13: VO2: 23.6 ml/kg/min (65% predicted)  VE/VCO2 30.2 RER 1.27  Recently started on milrinone about 2 weeks ago. Had viral gastroenteritis last week. Readmitted with ICD firing last night with K 3.5. Now markedly tachycardic (120s+) with SBP in 90. Severe hacking cough. CR stable at 1.2. Weight down to 140. INR 6.   Objective:    Vital Signs:   Temp:  [96.6 F (35.9 C)-98.3 F (36.8 C)] 97.5 F (36.4 C) (03/18 0825) Pulse Rate:  [84-139] 130  (03/18 0800) Resp:  [15-35] 25  (03/18 0800) BP: (88-135)/(56-121) 114/90 mmHg (03/18 0800) SpO2:  [89 %-97 %] 97 % (03/18 0800) Weight:  [63.6 kg (140 lb 3.4 oz)-63.7 kg (140 lb 6.9 oz)] 63.7 kg (140 lb 6.9 oz) (03/18 0500)    24-hour weight change: Weight change:   Intake/Output:   Intake/Output Summary (Last 24 hours) at 09/09/11 0917 Last data filed at 09/09/11 0800  Gross per 24 hour  Intake 380.96 ml  Output      0 ml  Net 380.96 ml     Physical Exam: General:  Ill appearing. No resp difficulty HEENT: normal Neck: supple. JVP 6-7 . Carotids 2+ bilat; no bruits. No lymphadenopathy or thryomegaly appreciated. Cor: PMI laterally displaced. Tachycardic loud s3. + 3/6 MR Lungs: clear Abdomen: soft, nontender, nondistended. No hepatosplenomegaly. No bruits or masses. Good bowel sounds. Extremities: no cyanosis, clubbing, rash, edema Neuro: alert & orientedx3, cranial nerves grossly intact. moves all 4  extremities w/o difficulty. Affect pleasant  Telemetry: sinus tach  Labs: Basic Metabolic Panel:  Lab 09/09/11 0445 09/08/11 2235  NA -- 132*  K -- 3.5  CL -- 96  CO2 -- 22  GLUCOSE -- 111*  BUN -- 15  CREATININE -- 1.29  CALCIUM -- 8.9  MG 1.7 --  PHOS -- --    Liver Function Tests:  Lab 09/08/11 2235  AST 31  ALT 50  ALKPHOS 98  BILITOT 0.6  PROT 6.8  ALBUMIN 2.9*   No results found for this basename: LIPASE:5,AMYLASE:5 in the last 168 hours No results found for this basename: AMMONIA:3 in the last 168 hours  CBC:  Lab 09/08/11 2235  WBC 7.0  NEUTROABS 4.6  HGB 13.5  HCT 37.8*  MCV 83.3  PLT 352    Cardiac Enzymes: No results found for this basename: CKTOTAL:5,CKMB:5,CKMBINDEX:5,TROPONINI:5 in the last 168 hours  BNP: No components found with this basename: POCBNP:5  CBG: No results found for this basename: GLUCAP:5 in the last 168 hours  Coagulation Studies:  Basename 09/09/11 0445  LABPROT 59.9*  INR 6.80*    Other results:   Imaging: Dg Chest Port 1 View  09/08/2011  *RADIOLOGY REPORT*  Clinical Data: Chronic CHF.  PORTABLE CHEST - 1 VIEW  Comparison: 07/06/2011  Findings: Right chest wall porta-catheter is   noted with tip in the SVC.  There is a left chest wall AICD lead in the right ventricle. Heart size is mildly enlarged.  Heart size is enlarged.  Increased size of pulmonary arteries suggest PA hypertension.  There is diffuse pulmonary venous congestion.  No effusion or interstitial edema.  IMPRESSION:  1.Pulmonary venous congestion  2.  PA hypertension  Original Report Authenticated By: TAYLOR H. STROUD, M.D.     Medications:     Scheduled Medications:    . allopurinol  100 mg Oral Daily  . aspirin EC  81 mg Oral Daily  . chlorpheniramine-HYDROcodone  5 mL Oral Once  . digoxin  125 mcg Oral Daily  . metoprolol succinate  25 mg Oral Daily  . potassium chloride  40 mEq Oral Once  . sodium chloride  3 mL Intravenous Q12H  .  Warfarin - Pharmacist Dosing Inpatient   Does not apply q1800  . DISCONTD: warfarin  5 mg Oral QHS  . DISCONTD: Warfarin - Physician Dosing Inpatient   Does not apply q1800    Infusions:    . milronone (PRIMACOR) in 0.9% NaCl infusion 0.25 mcg/kg/min (09/09/11 0338)    PRN Medications: sodium chloride, acetaminophen, ondansetron (ZOFRAN) IV, sodium chloride   Assessment:   1. VT with ICD firing 2. Cardiogenic shock 3. Chronic systolic HF with NICM EF 10% on home milrinone 4. Hypokalemia/hypomagnesemia 5. Chronic renal failure, stage 3 (stbale) 6. Supratherapeutic INR  Plan/Discussion:    I worry that he has worsening low output/cardiogenic shock despite milrinone (INTERMACS 2). Hard to know volume status but looks fairly dry on exam despite increasing BNP. Will check stat co-ox and if low will likely need to go to cath lab for RHC +/- IABP. Will likely need VAD soon. Give FFP to fix INR. Supp K+ and Mg. He is very tenuous. Will have Dr. Van Trigt see.  The patient is critically ill with cardiogenic shock and ventricular tachycardia and requires high complexity decision making for assessment and support, frequent evaluation and titration of therapies, application of advanced monitoring technologies and extensive interpretation of multiple databases.   Critical Care Time devoted to patient care services described in this note is 35 Minutes.    Jenna Routzahn,MD 9:25 AM Length of Stay: 1    

## 2011-09-09 NOTE — Discharge Summary (Signed)
Patient ID: [name] MRN: [id number] DOB/AGE: 01/04/1961 51 y.o.  Admit date: 09/08/2011 Discharge date: 09/10/2011   Primary Discharge Diagnosis 1. VT with ICD firing  2. Cardiogenic shock 3. Chronic systolic HF with NICM EF 10% on home milrinone  4. Hypokalemia/hypomagnesemia  5. Chronic renal failure, stage 3 (stable)  6. Supratherapeutic INR   Secondary Discharge Diagnosis 1. History of tobacco abuse, quit January 2013 (surveillance cotinine levels were normal)  Significant Diagnostic Studies: 1. Right heart catheterization and IABP insertion:   On milrinone 0.375 mcg/kg/min and levophed 4 mcg/kg/min  RA = 23  RV = 80/24/26  PA = 80/56 (66)  PCW = 57 (good tracing)  Fick cardiac output/index = 2.2/1.3  PVR = 4.0 Woods  PA sat = 40% (was 26% prior to levophed and increase in milrinone)  FA sat = 95%  With IABP and increase levophed to 53mcg/kg/min  PA 67/45 (55)  PCWP 39 --> on leaving lab 29 mmHG  Hospital Course:  Jadd is 51 y/o African American gentlemen with a longstanding h/o severe CHF due to NICM EF 10-15% (previously on transplant list), severe MR, mild renal insufficiency, NSVT s/p ICD with LV thrombus. He has been followed closely in the HF clinic. Had been doing fairly well until January 2013 when he was admitted with abdominal pain due to low output. RHC in January as below.  RA = 7  RV = 45/6/7  PA = 45/31 (35)  PCW = 20  Fick cardiac output/index = 3.06/1.75  PVR = 5.2 Woods  FA sat = 96%  PA sat = 58%, 56%   Treated briefly with milrinone and was discharged home off milrinone. CPX in February 2013 surprisingly good with pVO2: 23.6 ml/kg/min (65% predicted), VE/VCO2 30.2, and RER 1.27.  However, over past few weeks has had progressive fatigue and hypotension requiring cutting back or stopping all of his HF meds. Milrinone recently initiated as an outpatient.   Seen in HF clinic last week with viral gastroenteritis and his demadex was held during  his illness.  He presented to the [facility] Emergency Department on 3/17 after his ICD fired.  Upon presentation he was dyspneic, orthopneic, and had a nonproductive cough.  His ICD [facility]) was interrogated and showed a 21-joule shock appropriately for ventricular tachycardia with a type 2 break. He has had a total of 109 nonsustained VT episodes with 6 VT-1 monitor-only episodes. CXR showed no no acute infiltrate or effusions.  Potassium of 3.5, Cr stable at 1.2. pBNP > 5,000. INR supratherapeutic at 6.  Treated with supplemental potassium and magnesium.  Throughout the day on 3/18 became progressively tachycardic (120-140s) with SBP in the 90s.  Urine output begin to trail off.  Milrinone titrated up to 0.375 mcg/kg/min. Mixed venous sat was drawn and was 27%.  Levophed initiated and he was urgently given 2 units FFP and 5 mg vitamin K in preparation for cardiac catheterization. INR down to 2.1  Right heart cath (result above) showed cardiogenic shock with markedly elevated left sided filling pressures, IABP placed and levophed increased to 34mcg/kg/min with significant improvement. Marked increase in urine output with HR back down to 110-115 range.   Case d/w Dr. [name] at [facility] and arrangement made for transfer to [facility] for consideration of mechanical support therapy.   On exam:  Young male lying in bed at 30%. Comfortable. Occasional cough AF BP 117/86 HR 110 sats 98% on 2L Neck: Supple + L IJ swan. Cor: PMI displaced. Tachy regular +  s3. 3/6 MR Lungs: clear anteriorly Ab: soft. NT/ND Ext: warm. No edema. IABP sheath R groin Neuro: A&O x3. Non-focal. Affect pleasant.   Discharge Info: Blood pressure 121/77, pulse 116, temperature 99.9 F (37.7 C), temperature source Core (Comment), resp. rate 18, height 5\' 4"  (1.626 m), weight 63.7 kg (140 lb 6.9 oz), SpO2 97.00%.   Weight change:  Results for orders placed during the hospital encounter of 09/08/11 (from the past 24 hour(s))  ABO/RH      Status: Normal   Collection Time   09/08/11 10:16 PM      Component Value Range   ABO/RH(D) O POS    CBC     Status: Abnormal   Collection Time   09/08/11 10:35 PM      Component Value Range   WBC 7.0  4.0 - 10.5 (K/uL)   RBC 4.54  4.22 - 5.81 (MIL/uL)   Hemoglobin 13.5  13.0 - 17.0 (g/dL)   HCT 40.9 (*) 81.1 - 52.0 (%)   MCV 83.3  78.0 - 100.0 (fL)   MCH 29.7  26.0 - 34.0 (pg)   MCHC 35.7  30.0 - 36.0 (g/dL)   RDW 91.4  78.2 - 95.6 (%)   Platelets 352  150 - 400 (K/uL)  DIFFERENTIAL     Status: Normal   Collection Time   09/08/11 10:35 PM      Component Value Range   Neutrophils Relative 66  43 - 77 (%)   Neutro Abs 4.6  1.7 - 7.7 (K/uL)   Lymphocytes Relative 23  12 - 46 (%)   Lymphs Abs 1.6  0.7 - 4.0 (K/uL)   Monocytes Relative 9  3 - 12 (%)   Monocytes Absolute 0.6  0.1 - 1.0 (K/uL)   Eosinophils Relative 1  0 - 5 (%)   Eosinophils Absolute 0.1  0.0 - 0.7 (K/uL)   Basophils Relative 1  0 - 1 (%)   Basophils Absolute 0.1  0.0 - 0.1 (K/uL)  COMPREHENSIVE METABOLIC PANEL     Status: Abnormal   Collection Time   09/08/11 10:35 PM      Component Value Range   Sodium 132 (*) 135 - 145 (mEq/L)   Potassium 3.5  3.5 - 5.1 (mEq/L)   Chloride 96  96 - 112 (mEq/L)   CO2 22  19 - 32 (mEq/L)   Glucose, Bld 111 (*) 70 - 99 (mg/dL)   BUN 15  6 - 23 (mg/dL)   Creatinine, Ser 2.13  0.50 - 1.35 (mg/dL)   Calcium 8.9  8.4 - 08.6 (mg/dL)   Total Protein 6.8  6.0 - 8.3 (g/dL)   Albumin 2.9 (*) 3.5 - 5.2 (g/dL)   AST 31  0 - 37 (U/L)   ALT 50  0 - 53 (U/L)   Alkaline Phosphatase 98  39 - 117 (U/L)   Total Bilirubin 0.6  0.3 - 1.2 (mg/dL)   GFR calc non Af Amer 64 (*) >90 (mL/min)   GFR calc Af Amer 74 (*) >90 (mL/min)  PRO B NATRIURETIC PEPTIDE     Status: Abnormal   Collection Time   09/08/11 10:35 PM      Component Value Range   Pro B Natriuretic peptide (BNP) 5981.0 (*) 0 - 125 (pg/mL)  MRSA PCR SCREENING     Status: Normal   Collection Time   09/09/11  3:36 AM      Component  Value Range   MRSA by PCR NEGATIVE  NEGATIVE  PROTIME-INR     Status: Abnormal   Collection Time   09/09/11  4:45 AM      Component Value Range   Prothrombin Time 59.9 (*) 11.6 - 15.2 (seconds)   INR 6.80 (*) 0.00 - 1.49   MAGNESIUM     Status: Normal   Collection Time   09/09/11  4:45 AM      Component Value Range   Magnesium 1.7  1.5 - 2.5 (mg/dL)  CARBOXYHEMOGLOBIN     Status: Abnormal   Collection Time   09/09/11  9:50 AM      Component Value Range   Total hemoglobin 12.9 (*) 13.5 - 18.0 (g/dL)   O2 Saturation 40.9     Carboxyhemoglobin 1.1  0.5 - 1.5 (%)   Methemoglobin 0.3  0.0 - 1.5 (%)  PREPARE FRESH FROZEN PLASMA     Status: Normal (Preliminary result)   Collection Time   09/09/11 10:00 AM      Component Value Range   Unit Number [id number]     Blood Component Type THAWED PLASMA     Unit division 00     Status of Unit ISSUED     Transfusion Status OK TO TRANSFUSE     Unit Number [id number]     Blood Component Type THAWED PLASMA     Unit division 00     Status of Unit ISSUED     Transfusion Status OK TO TRANSFUSE    TYPE AND SCREEN     Status: Normal   Collection Time   09/09/11 10:05 AM      Component Value Range   ABO/RH(D) O POS     Antibody Screen NEG     Sample Expiration 09/12/2011    CARBOXYHEMOGLOBIN     Status: Normal   Collection Time   09/09/11  1:12 PM      Component Value Range   Total hemoglobin 13.8  13.5 - 18.0 (g/dL)   O2 Saturation 57.8     Carboxyhemoglobin 0.9  0.5 - 1.5 (%)   Methemoglobin 1.5  0.0 - 1.5 (%)  PROTIME-INR     Status: Abnormal   Collection Time   09/09/11  2:00 PM      Component Value Range   Prothrombin Time 23.9 (*) 11.6 - 15.2 (seconds)   INR 2.10 (*) 0.00 - 1.49   CARBOXYHEMOGLOBIN     Status: Abnormal   Collection Time   09/09/11  2:45 PM      Component Value Range   Total hemoglobin 13.2 (*) 13.5 - 18.0 (g/dL)   O2 Saturation 46.9     Carboxyhemoglobin 0.7  0.5 - 1.5 (%)   Methemoglobin 0.5  0.0 - 1.5 (%)  POCT  I-STAT 3, BLOOD GAS (G3P V)     Status: Abnormal   Collection Time   09/09/11  5:08 PM      Component Value Range   pH, Ven 7.368 (*) 7.250 - 7.300    pCO2, Ven 44.9 (*) 45.0 - 50.0 (mmHg)   pO2, Ven 24.0 (*) 30.0 - 45.0 (mmHg)   Bicarbonate 25.8 (*) 20.0 - 24.0 (mEq/L)   TCO2 27  0 - 100 (mmol/L)   O2 Saturation 40.0     Sample type VENOUS    POCT I-STAT 3, BLOOD GAS (G3+)     Status: Abnormal   Collection Time   09/09/11  5:13 PM      Component Value Range   pH, Arterial 7.415  7.350 - 7.450  pCO2 arterial 36.9  35.0 - 45.0 (mmHg)   pO2, Arterial 76.0 (*) 80.0 - 100.0 (mmHg)   Bicarbonate 23.7  20.0 - 24.0 (mEq/L)   TCO2 25  0 - 100 (mmol/L)   O2 Saturation 95.0     Acid-base deficit 1.0  0.0 - 2.0 (mmol/L)   Sample type ARTERIAL    POCT I-STAT 3, BLOOD GAS (G3P V)     Status: Abnormal   Collection Time   09/09/11  5:33 PM      Component Value Range   pH, Ven 7.386 (*) 7.250 - 7.300    pCO2, Ven 42.3 (*) 45.0 - 50.0 (mmHg)   pO2, Ven 29.0 (*) 30.0 - 45.0 (mmHg)   Bicarbonate 25.4 (*) 20.0 - 24.0 (mEq/L)   TCO2 27  0 - 100 (mmol/L)   O2 Saturation 54.0     Sample type VENOUS       Discharge Medications: Medication List  As of 09/09/2011  7:34 PM   ASK your doctor about these medications         allopurinol 100 MG tablet   Commonly known as: ZYLOPRIM   Take 100 mg by mouth daily. For gout      digoxin 0.125 MG tablet   Commonly known as: LANOXIN   Take 125 mcg by mouth daily.      hydrALAZINE 25 MG tablet   Commonly known as: APRESOLINE   Take 12.5 mg by mouth 3 (three) times daily.      potassium chloride SA 20 MEQ tablet   Commonly known as: K-DUR,KLOR-CON   Take 20 mEq by mouth as needed.      sodium chloride 0.9 % SOLN with milrinone 1 MG/ML SOLN 200 mcg/mL   Inject 0.25 mcg/kg/min into the vein continuous.      torsemide 20 MG tablet   Commonly known as: DEMADEX   Take 20 mg by mouth 2 (two) times daily.      warfarin 5 MG tablet   Commonly known  as: COUMADIN   Take 5 mg by mouth at bedtime.            Follow-up Plans & Instructions: Discharge Orders    Future Appointments: Provider: Department: Dept Phone: Center:   09/23/2011 1:30 PM [facility] [phone] None   11/21/2011 11:00 AM Marinus Maw, MD [facility] [phone] [facility]     Follow-up Information    Follow up with Oliver Barre, MD .          Layla Maw ALL MEDICATIONS WITH YOU TO FOLLOW UP APPOINTMENTS  Time spent with patient to include physician time: Greater than 35 minutes Signed:

## 2011-09-09 NOTE — Progress Notes (Addendum)
Advanced Heart Failure Rounding Note   Subjective:    [name] is 51 y/o with severe CHF due to NICM EF 10-15%, severe MR on home milrinone. Was previously on transplant list but was taken off as he didn't respond to a page when a heart became available. Also has h/o mild CRI, NSVT s/p ICD and LV thrombus.    RHC 07/08/11 RA = 7  RV = 45/6/7  PA = 45/31 (35)  PCW = 20  Fick cardiac output/index = 3.06/1.75  PVR = 5.2 Woods  FA sat = 96%  PA sat = 58%, 56%   07/10/11: On milrinone 0.25 mcg/kg/min CO 4.27 and CI 2.36  CPX 2/13: VO2: 23.6 ml/kg/min (65% predicted)  VE/VCO2 30.2 RER 1.27  Recently started on milrinone about 2 weeks ago. Had viral gastroenteritis last week. Readmitted with ICD firing last night with K 3.5. Now markedly tachycardic (120s+) with SBP in 90. Severe hacking cough. CR stable at 1.2. Weight down to 140. INR 6.   Objective:    Vital Signs:   Temp:  [96.6 F (35.9 C)-98.3 F (36.8 C)] 97.5 F (36.4 C) (03/18 0825) Pulse Rate:  [84-139] 130  (03/18 0800) Resp:  [15-35] 25  (03/18 0800) BP: (88-135)/(56-121) 114/90 mmHg (03/18 0800) SpO2:  [89 %-97 %] 97 % (03/18 0800) Weight:  [63.6 kg (140 lb 3.4 oz)-63.7 kg (140 lb 6.9 oz)] 63.7 kg (140 lb 6.9 oz) (03/18 0500)    24-hour weight change: Weight change:   Intake/Output:   Intake/Output Summary (Last 24 hours) at 09/09/11 0917 Last data filed at 09/09/11 0800  Gross per 24 hour  Intake 380.96 ml  Output      0 ml  Net 380.96 ml     Physical Exam: General:  Ill appearing. No resp difficulty HEENT: normal Neck: supple. JVP 6-7 . Carotids 2+ bilat; no bruits. No lymphadenopathy or thryomegaly appreciated. Cor: PMI laterally displaced. Tachycardic loud s3. + 3/6 MR Lungs: clear Abdomen: soft, nontender, nondistended. No hepatosplenomegaly. No bruits or masses. Good bowel sounds. Extremities: no cyanosis, clubbing, rash, edema Neuro: alert & orientedx3, cranial nerves grossly intact. moves all 4  extremities w/o difficulty. Affect pleasant  Telemetry: sinus tach  Labs: Basic Metabolic Panel:  Lab 09/09/11 4034 09/08/11 2235  NA -- 132*  K -- 3.5  CL -- 96  CO2 -- 22  GLUCOSE -- 111*  BUN -- 15  CREATININE -- 1.29  CALCIUM -- 8.9  MG 1.7 --  PHOS -- --    Liver Function Tests:  Lab 09/08/11 2235  AST 31  ALT 50  ALKPHOS 98  BILITOT 0.6  PROT 6.8  ALBUMIN 2.9*   No results found for this basename: LIPASE:5,AMYLASE:5 in the last 168 hours No results found for this basename: AMMONIA:3 in the last 168 hours  CBC:  Lab 09/08/11 2235  WBC 7.0  NEUTROABS 4.6  HGB 13.5  HCT 37.8*  MCV 83.3  PLT 352    Cardiac Enzymes: No results found for this basename: CKTOTAL:5,CKMB:5,CKMBINDEX:5,TROPONINI:5 in the last 168 hours  BNP: No components found with this basename: POCBNP:5  CBG: No results found for this basename: GLUCAP:5 in the last 168 hours  Coagulation Studies:  Basename 09/09/11 0445  LABPROT 59.9*  INR 6.80*    Other results:   Imaging: Dg Chest Port 1 View  09/08/2011  *RADIOLOGY REPORT*  Clinical Data: Chronic CHF.  PORTABLE CHEST - 1 VIEW  Comparison: 07/06/2011  Findings: Right chest wall porta-catheter is  noted with tip in the SVC.  There is a left chest wall AICD lead in the right ventricle. Heart size is mildly enlarged.  Heart size is enlarged.  Increased size of pulmonary arteries suggest PA hypertension.  There is diffuse pulmonary venous congestion.  No effusion or interstitial edema.  IMPRESSION:  1.Pulmonary venous congestion  2.  PA hypertension  Original Report Authenticated By: Rosealee Albee, M.D.     Medications:     Scheduled Medications:    . allopurinol  100 mg Oral Daily  . aspirin EC  81 mg Oral Daily  . chlorpheniramine-HYDROcodone  5 mL Oral Once  . digoxin  125 mcg Oral Daily  . metoprolol succinate  25 mg Oral Daily  . potassium chloride  40 mEq Oral Once  . sodium chloride  3 mL Intravenous Q12H  .  Warfarin - Pharmacist Dosing Inpatient   Does not apply q1800  . DISCONTD: warfarin  5 mg Oral QHS  . DISCONTD: Warfarin - Physician Dosing Inpatient   Does not apply q1800    Infusions:    . milronone (PRIMACOR) in 0.9% NaCl infusion 0.25 mcg/kg/min (09/09/11 0338)    PRN Medications: sodium chloride, acetaminophen, ondansetron (ZOFRAN) IV, sodium chloride   Assessment:   1. VT with ICD firing 2. Cardiogenic shock 3. Chronic systolic HF with NICM EF 10% on home milrinone 4. Hypokalemia/hypomagnesemia 5. Chronic renal failure, stage 3 (stbale) 6. Supratherapeutic INR  Plan/Discussion:    I worry that he has worsening low output/cardiogenic shock despite milrinone (INTERMACS 2). Hard to know volume status but looks fairly dry on exam despite increasing BNP. Will check stat co-ox and if low will likely need to go to cath lab for RHC +/- IABP. Will likely need VAD soon. Give FFP to fix INR. Supp K+ and Mg. He is very tenuous. Will have Dr. Donata Clay see.  The patient is critically ill with cardiogenic shock and ventricular tachycardia and requires high complexity decision making for assessment and support, frequent evaluation and titration of therapies, application of advanced monitoring technologies and extensive interpretation of multiple databases.   Critical Care Time devoted to patient care services described in this note is 35 Minutes.    Mekhia Brogan,MD 9:25 AM Length of Stay: 1

## 2011-09-09 NOTE — Progress Notes (Signed)
Pt experiencing strong cough, often fits of coughing.  Occasionally, 6-8 beat run of ventricular tachycardia occurs during the coughing fit.  HR remains from 110's to 120's.  Pt denies chest pain.  States SOB when coughing. Will continue to  monitor.

## 2011-09-09 NOTE — Progress Notes (Signed)
ANTICOAGULATION CONSULT NOTE - Initial Consult  Pharmacy Consult for Heparin Indication: hx LV thrombus  No Known Allergies  Patient Measurements: Height: 5\' 4"  (162.6 cm) Weight: 140 lb 6.9 oz (63.7 kg) IBW/kg (Calculated) : 59.2   Vital Signs: Temp: 98.1 F (36.7 C) (03/18 1500) Temp src: Oral (03/18 1500) BP: 97/71 mmHg (03/18 1545) Pulse Rate: 137  (03/18 1545)  Labs:  Basename 09/09/11 1400 09/09/11 0445 09/08/11 2235  HGB -- -- 13.5  HCT -- -- 37.8*  PLT -- -- 352  APTT -- -- --  LABPROT 23.9* 59.9* --  INR 2.10* 6.80* --  HEPARINUNFRC -- -- --  CREATININE -- -- 1.29  CKTOTAL -- -- --  CKMB -- -- --  TROPONINI -- -- --   Estimated Creatinine Clearance: 58.6 ml/min (by C-G formula based on Cr of 1.29).  Medical History: Past Medical History  Diagnosis Date  . CHF (congestive heart failure)     EF- 10-15  . Medically noncompliant   . Mitral regurgitation   . Tobacco user   . HTN (hypertension)   . Depression   . AICD (automatic cardioverter/defibrillator) present   . GERD (gastroesophageal reflux disease)   . Substance abuse   . Migraines   . Chronic renal insufficiency   . Syncope   . Sleep apnea     Medications:  Prescriptions prior to admission  Medication Sig Dispense Refill  . allopurinol (ZYLOPRIM) 100 MG tablet Take 100 mg by mouth daily. For gout      . digoxin (LANOXIN) 0.125 MG tablet Take 125 mcg by mouth daily.      . hydrALAZINE (APRESOLINE) 25 MG tablet Take 12.5 mg by mouth 3 (three) times daily.       . potassium chloride SA (K-DUR,KLOR-CON) 20 MEQ tablet Take 20 mEq by mouth as needed.      . sodium chloride 0.9 % SOLN with milrinone 1 MG/ML SOLN 200 mcg/mL Inject 0.25 mcg/kg/min into the vein continuous.      . torsemide (DEMADEX) 20 MG tablet Take 20 mg by mouth 2 (two) times daily.      Marland Kitchen warfarin (COUMADIN) 5 MG tablet Take 5 mg by mouth at bedtime.       Scheduled:     . allopurinol  100 mg Oral Daily  .  chlorpheniramine-HYDROcodone  5 mL Oral Once  . digoxin  125 mcg Oral Daily  . furosemide      . furosemide  80 mg Intravenous Once  . heparin      . heparin      . lidocaine      . magnesium sulfate 1 - 4 g bolus IVPB  3 g Intravenous Once  . metoprolol succinate  25 mg Oral Daily  . midazolam      . phytonadione (VITAMIN K) IV  2.5 mg Intravenous Once  . phytonadione  2.5 mg Oral Once  . potassium chloride  40 mEq Oral Once  . potassium chloride  40 mEq Oral Daily  . sodium chloride  3 mL Intravenous Q12H  . Warfarin - Pharmacist Dosing Inpatient   Does not apply q1800  . DISCONTD: aspirin EC  81 mg Oral Daily  . DISCONTD: furosemide  80 mg Intravenous Once  . DISCONTD: magnesium sulfate  3 g Intravenous Once  . DISCONTD: warfarin  5 mg Oral QHS  . DISCONTD: Warfarin - Physician Dosing Inpatient   Does not apply q1800    Assessment/Plan: 51yo male admitted s/p ICD  firing for VT vs SVT, on Coumadin PTA for LV thrombus and with supratherapeutic INR this AM.  Received Vit K 2.5mg  IV at 1000 and Vit K 2.5mg  po at 1400 and INR at 1400 was 2.1.  Now to begin heparin in cath lab.  INR is likely below 2.  Goal of Therapy:  Heparin level 0.3-0.7  Plan: 1.  Begin heparin gtt at 1000 units/hr in cath lab 2.  F/U s/p cath for Coumadin/heparin orders  Rolland Porter, Pharm.D., BCPS Clinical Pharmacist Pager: [phone]

## 2011-09-09 NOTE — Op Note (Signed)
Cardiac Cath Procedure Note:  Indication:   Procedures performed:  1) Right heart catheterization 2) IABP insertion  Description of procedure:   The risks and indication of the procedure were explained. Consent was signed and placed on the chart. An appropriate timeout was taken prior to the procedure. The left neck was prepped and draped in the routine sterile fashion and anesthetized with 1% local lidocaine.   A 7 FR venous sheath was placed in the left internal jugular vein using a modified Seldinger technique. A standard Swan-Ganz catheter was used for the procedure. The sheath was then sewn into place. A 7FR IABP sheath was placed in the R femoral artery and a 34cm IABP was placed under flouro guidance.   Complications: None apparent.  Findings:  On milrinone 0.375 mcg/kg/min and levophed 4 mcg/kg/min  RA = 23 RV = 80/24/26 PA = 80/56 (66) PCW = 57 (good tracing) Fick cardiac output/index = 2.2/1.3 PVR = 4.0 Woods PA sat = 40% (was 26% prior to levophed and increase in milrinone) FA sat =  95%  With IABP and increase levophed to 46mcg/kg/min  PA 67/45 (55) PCWP 39  --> on leaving lab 29 mmHG  Assessment: 1) Cardiogenic shock with markedly elevated left sided filling pressures 2) Significant improvement with IABP and dual pressor support  Plan/Discussion:  Transfer to [facility] for potential LVAD. Discussed with Dr. Jerrol Banana at [facility].  [name] 5:54 PM

## 2011-09-10 ENCOUNTER — Other Ambulatory Visit: Payer: Self-pay

## 2011-09-10 LAB — PROTIME-INR: Prothrombin Time: 16.7 seconds — ABNORMAL HIGH (ref 11.6–15.2)

## 2011-09-10 LAB — PREPARE FRESH FROZEN PLASMA: Unit division: 0

## 2011-09-12 DIAGNOSIS — I059 Rheumatic mitral valve disease, unspecified: Secondary | ICD-10-CM

## 2011-09-12 DIAGNOSIS — I509 Heart failure, unspecified: Secondary | ICD-10-CM

## 2011-09-23 ENCOUNTER — Ambulatory Visit (HOSPITAL_COMMUNITY): Payer: Medicare Other

## 2011-10-14 ENCOUNTER — Ambulatory Visit (INDEPENDENT_AMBULATORY_CARE_PROVIDER_SITE_OTHER): Payer: Medicare Other | Admitting: *Deleted

## 2011-10-14 DIAGNOSIS — G43009 Migraine without aura, not intractable, without status migrainosus: Secondary | ICD-10-CM

## 2011-10-14 DIAGNOSIS — I749 Embolism and thrombosis of unspecified artery: Secondary | ICD-10-CM

## 2011-10-14 DIAGNOSIS — I5022 Chronic systolic (congestive) heart failure: Secondary | ICD-10-CM

## 2011-10-14 DIAGNOSIS — I8290 Acute embolism and thrombosis of unspecified vein: Secondary | ICD-10-CM

## 2011-10-14 DIAGNOSIS — I059 Rheumatic mitral valve disease, unspecified: Secondary | ICD-10-CM

## 2011-10-14 DIAGNOSIS — I509 Heart failure, unspecified: Secondary | ICD-10-CM

## 2011-10-14 LAB — BASIC METABOLIC PANEL
BUN: 8 mg/dL (ref 6–23)
Chloride: 98 mEq/L (ref 96–112)
Potassium: 3.5 mEq/L (ref 3.5–5.1)
Sodium: 137 mEq/L (ref 135–145)

## 2011-10-16 ENCOUNTER — Telehealth: Payer: Self-pay | Admitting: Cardiology

## 2011-10-16 NOTE — Telephone Encounter (Signed)
Faxed Protime-INR to Mary @[facility]. 4/24 emg

## 2011-10-18 ENCOUNTER — Telehealth (HOSPITAL_COMMUNITY): Payer: Self-pay | Admitting: Anesthesiology

## 2011-10-18 ENCOUNTER — Other Ambulatory Visit (HOSPITAL_COMMUNITY): Payer: Self-pay | Admitting: Anesthesiology

## 2011-10-18 DIAGNOSIS — I5022 Chronic systolic (congestive) heart failure: Secondary | ICD-10-CM

## 2011-10-18 NOTE — Telephone Encounter (Signed)
Error

## 2011-10-18 NOTE — Telephone Encounter (Signed)
Left message for pt to call about getting INR checked today

## 2011-10-21 ENCOUNTER — Encounter: Payer: Self-pay | Admitting: Internal Medicine

## 2011-10-21 ENCOUNTER — Other Ambulatory Visit: Payer: Medicare Other

## 2011-10-21 DIAGNOSIS — I5022 Chronic systolic (congestive) heart failure: Secondary | ICD-10-CM

## 2011-10-21 DIAGNOSIS — Z0000 Encounter for general adult medical examination without abnormal findings: Secondary | ICD-10-CM | POA: Insufficient documentation

## 2011-10-21 LAB — PROTIME-INR
INR: 1.6 ratio — ABNORMAL HIGH (ref 0.8–1.0)
Prothrombin Time: 17.7 s — ABNORMAL HIGH (ref 10.2–12.4)

## 2011-10-22 ENCOUNTER — Encounter: Payer: Self-pay | Admitting: Internal Medicine

## 2011-10-22 ENCOUNTER — Ambulatory Visit (INDEPENDENT_AMBULATORY_CARE_PROVIDER_SITE_OTHER): Payer: Medicare Other | Admitting: Internal Medicine

## 2011-10-22 DIAGNOSIS — M549 Dorsalgia, unspecified: Secondary | ICD-10-CM

## 2011-10-22 DIAGNOSIS — I5022 Chronic systolic (congestive) heart failure: Secondary | ICD-10-CM

## 2011-10-22 DIAGNOSIS — F319 Bipolar disorder, unspecified: Secondary | ICD-10-CM

## 2011-10-22 DIAGNOSIS — G43909 Migraine, unspecified, not intractable, without status migrainosus: Secondary | ICD-10-CM | POA: Insufficient documentation

## 2011-10-22 HISTORY — DX: Bipolar disorder, unspecified: F31.9

## 2011-10-22 MED ORDER — CYCLOBENZAPRINE HCL 5 MG PO TABS: 5.0000 mg | ORAL_TABLET | Freq: Three times a day (TID) | ORAL | Status: AC | PRN

## 2011-10-22 MED ORDER — BUTALBITAL-APAP-CAFFEINE 50-325-40 MG PO TABS: 1.0000 | ORAL_TABLET | Freq: Every day | ORAL | Status: DC | PRN

## 2011-10-22 NOTE — Assessment & Plan Note (Signed)
With severe impariment, even to light housework;  Continue all other medications as before and card f/u;  [facility], as well as [facility] for eval

## 2011-10-22 NOTE — Assessment & Plan Note (Signed)
Mild symptomatic at this time, may be treatment limiting factor to some degree, with ongoing social stressors, for [facility]

## 2011-10-22 NOTE — Assessment & Plan Note (Signed)
Recurrent mild, nonradicular nontraumatic, suspect underlying chronic MSK vs lumbar DJD/ddd - for trial flexeril prn, f/u prn

## 2011-10-22 NOTE — Patient Instructions (Signed)
Take all new medications as prescribed - the generic fioricet for migraine, and the generic flexeril for back pain/muscle spasm Continue all other medications as before Please keep your appointments with your specialists as you have planned - cardiology You will be contacted regarding the referral for: [facility] You will be contacted regarding the referral for: [facility], and the [facility] nurse who helps as well Please return in 6 months, or sooner if needed

## 2011-10-22 NOTE — Progress Notes (Signed)
Subjective:    Patient ID: [name], male    DOB: [birth year], 51 y.o.   MRN: [id number]  HPI  Here to re-established as new pt after being lost to f/u for many yrs (>3 yrs);  Since last seen has new dx of Bipolar illness, has been followed in the past at [facility], cannot recall prior meds.  Has severe chronic systolic CHF, now impaired to the point of being advised to avoid even light housework. Currently doing ok -  Pt denies chest pain, increased sob or doe, wheezing, orthopnea, PND, increased LE swelling, palpitations, dizziness or syncope.  Pt denies new neurological symptoms such as new headache, or facial or extremity weakness or numbness   Pt denies polydipsia, polyuria.  Overall good compliance with treatment, and good medicine tolerability per pt.  Does request home health care help given his impairment.  No overt bleeding or bruising on coumadin.  No recent gout joint pain.   Pt denies fever, wt loss, night sweats, loss of appetite, or other constitutional symptoms.  Denies worsening depressive symptoms, suicidal ideation, or panic, though has ongoing anxiety, some increased recently due to stressors and family/son living with him.  Has been on lexapro in the past but does not want further, as it cuased him to sleep for 2 days.  Also with recurrent migraine HA in addition to other HA's, non triptan candidate due to heart dz.  Also with recurring mild lumbar back pain, off and on, sharp, nonradicular, ongoing chronic stable for 1-2 yrs.  Pt continues to have recurring LBP without change in severity, bowel or bladder change, fever, wt loss,  worsening LE pain/numbness/weakness, gait change or falls.  Past Medical History  Diagnosis Date  . CHF (congestive heart failure)     EF- 10-15  . Medically noncompliant   . Mitral regurgitation   . Tobacco user   . HTN (hypertension)   . Depression   . AICD (automatic cardioverter/defibrillator) present   . GERD (gastroesophageal reflux  disease)   . Substance abuse   . Migraines   . Chronic renal insufficiency   . Syncope   . Sleep apnea   . Thrombus 08/06/2010  . SYSTOLIC HEART FAILURE, CHRONIC 09/22/2008    Qualifier: Diagnosis of  By: Gala Romney, MD, Trixie Dredge   . LV (left ventricular) mural thrombus 01/28/2011  . ICD - IN SITU 09/16/2008    Qualifier: Diagnosis of  By: Wonda Amis    . MITRAL STENOSIS/ INSUFFICIENCY, NON-RHEUMATIC 09/22/2008    Qualifier: Diagnosis of  By: Gala Romney, MD, Trixie Dredge Hepatomegaly 09/16/2008    Qualifier: Diagnosis of  By: Wonda Amis    . COMMON MIGRAINE 06/14/2009    Qualifier: Diagnosis of  By: Jonny Ruiz MD, Len Blalock   . Bipolar affective disorder 10/22/2011   Past Surgical History  Procedure Date  . Cardiac defibrillator placement     reports that he quit smoking about 3 months ago. His smoking use included Cigarettes. He has a 5 pack-year smoking history. He quit smokeless tobacco use about 3 months ago. He reports that he does not drink alcohol or use illicit drugs. family history is negative for Coronary artery disease. No Known Allergies Current Outpatient Prescriptions on File Prior to Visit  Medication Sig Dispense Refill  . allopurinol (ZYLOPRIM) 100 MG tablet Take 100 mg by mouth daily. For gout      . digoxin (LANOXIN) 0.125 MG tablet Take 125 mcg by mouth daily.      Marland Kitchen  hydrALAZINE (APRESOLINE) 25 MG tablet Take 12.5 mg by mouth 3 (three) times daily.       . potassium chloride SA (K-DUR,KLOR-CON) 20 MEQ tablet Take 20 mEq by mouth as needed.      . sodium chloride 0.9 % SOLN with milrinone 1 MG/ML SOLN 200 mcg/mL Inject 0.25 mcg/kg/min into the vein continuous.      . torsemide (DEMADEX) 20 MG tablet Take 20 mg by mouth 2 (two) times daily.      Marland Kitchen warfarin (COUMADIN) 5 MG tablet Take 5 mg by mouth at bedtime.      Marland Kitchen DISCONTD: isosorbide mononitrate (IMDUR) 30 MG 24 hr tablet Take 1 tablet (30 mg total) by mouth daily.  60 tablet  6   Review of  Systems Review of Systems  Constitutional: Negative for diaphoresis and unexpected weight change.  Eyes: Negative for photophobia and visual disturbance.  Respiratory: Negative for choking and stridor.   Gastrointestinal: Negative for vomiting and blood in stool.  Genitourinary: Negative for hematuria and decreased urine volume.  Musculoskeletal: Negative for gait problem.  Skin: Negative for color change and wound.  Neurological: Negative for tremors and numbness.     Objective:   Physical Exam Pulse 85  Temp(Src) 97 F (36.1 C) (Oral)  Ht 5\' 5"  (1.651 m)  Wt 145 lb (65.772 kg)  BMI 24.13 kg/m2  SpO2 99% Physical Exam  VS noted, not ill appearing Constitutional: Pt appears well-developed and well-nourished.  HENT: Head: Normocephalic.  Right Ear: External ear normal.  Left Ear: External ear normal.  Eyes: Conjunctivae and EOM are normal. Pupils are equal, round, and reactive to light.  Neck: Normal range of motion. Neck supple.  Cardiovascular: Normal rate and regular rhythm.   Pulmonary/Chest: Effort normal and breath sounds normal.  Abd:  Soft, NT, non-distended, + BS Neurological: Pt is alert. Not confused,  Gait/motor intact  CN 2-12 intact Spine nontender Skin: Skin is warm. No erythema.  Psychiatric: Pt behavior is animated but also mild dysphoric.  Thought content normal.     Assessment & Plan:

## 2011-10-22 NOTE — Assessment & Plan Note (Signed)
Recurrent but stable pattern, for fioricet prn, f/u prn

## 2011-10-25 ENCOUNTER — Other Ambulatory Visit (HOSPITAL_COMMUNITY): Payer: Self-pay | Admitting: Anesthesiology

## 2011-10-25 DIAGNOSIS — I5022 Chronic systolic (congestive) heart failure: Secondary | ICD-10-CM

## 2011-10-25 DIAGNOSIS — I509 Heart failure, unspecified: Secondary | ICD-10-CM

## 2011-10-25 DIAGNOSIS — Z95811 Presence of heart assist device: Secondary | ICD-10-CM

## 2011-10-28 ENCOUNTER — Other Ambulatory Visit: Payer: Medicare Other

## 2011-10-29 ENCOUNTER — Telehealth (HOSPITAL_COMMUNITY): Payer: Self-pay | Admitting: *Deleted

## 2011-10-29 NOTE — Telephone Encounter (Signed)
Spoke w/Melissa they have home care orders for pt per Dr [name] at [facility] and have been getting his INR and faxing results there, advised that was fine they could continue to do this

## 2011-10-29 NOTE — Telephone Encounter (Signed)
[name] would like you to call her back to update you on what she has found out. Thanks.

## 2011-10-30 ENCOUNTER — Telehealth (HOSPITAL_COMMUNITY): Payer: Self-pay | Admitting: Anesthesiology

## 2011-10-30 NOTE — Telephone Encounter (Signed)
Received labs from [facility] INR 1.4, called patient and faxed results to [facility]

## 2011-11-01 ENCOUNTER — Encounter (HOSPITAL_COMMUNITY): Payer: Self-pay

## 2011-11-04 DIAGNOSIS — N189 Chronic kidney disease, unspecified: Secondary | ICD-10-CM

## 2011-11-04 DIAGNOSIS — I509 Heart failure, unspecified: Secondary | ICD-10-CM

## 2011-11-04 DIAGNOSIS — I129 Hypertensive chronic kidney disease with stage 1 through stage 4 chronic kidney disease, or unspecified chronic kidney disease: Secondary | ICD-10-CM

## 2011-11-04 DIAGNOSIS — I059 Rheumatic mitral valve disease, unspecified: Secondary | ICD-10-CM

## 2011-11-11 DIAGNOSIS — I059 Rheumatic mitral valve disease, unspecified: Secondary | ICD-10-CM

## 2011-11-11 DIAGNOSIS — N189 Chronic kidney disease, unspecified: Secondary | ICD-10-CM

## 2011-11-11 DIAGNOSIS — I509 Heart failure, unspecified: Secondary | ICD-10-CM

## 2011-11-11 DIAGNOSIS — I129 Hypertensive chronic kidney disease with stage 1 through stage 4 chronic kidney disease, or unspecified chronic kidney disease: Secondary | ICD-10-CM

## 2011-11-21 ENCOUNTER — Encounter: Payer: Self-pay | Admitting: Internal Medicine

## 2011-11-21 ENCOUNTER — Ambulatory Visit (INDEPENDENT_AMBULATORY_CARE_PROVIDER_SITE_OTHER): Payer: Medicare Other | Admitting: Internal Medicine

## 2011-11-21 VITALS — BP 110/80 | HR 60 | Ht 65.0 in | Wt 146.8 lb

## 2011-11-21 DIAGNOSIS — Z95810 Presence of automatic (implantable) cardiac defibrillator: Secondary | ICD-10-CM

## 2011-11-21 DIAGNOSIS — I472 Ventricular tachycardia: Secondary | ICD-10-CM

## 2011-11-21 DIAGNOSIS — I5022 Chronic systolic (congestive) heart failure: Secondary | ICD-10-CM

## 2011-11-21 DIAGNOSIS — R570 Cardiogenic shock: Secondary | ICD-10-CM

## 2011-11-21 LAB — ICD DEVICE OBSERVATION
DEVICE MODEL ICD: 105117
FVT: 1
RV LEAD AMPLITUDE: 11.6 mv
RV LEAD THRESHOLD: 0.6 V
TZAT-0001FASTVT: 1
TZAT-0001SLOWVT: 1
TZAT-0002SLOWVT: NEGATIVE
TZAT-0013FASTVT: 4
TZAT-0013FASTVT: 4
TZAT-0018FASTVT: NEGATIVE
TZAT-0018SLOWVT: NEGATIVE
TZAT-0018SLOWVT: NEGATIVE
TZON-0003FASTVT: 300 ms
TZST-0001FASTVT: 3
TZST-0001FASTVT: 4
TZST-0001FASTVT: 6
TZST-0001SLOWVT: 3
TZST-0001SLOWVT: 4
TZST-0002SLOWVT: NEGATIVE
TZST-0002SLOWVT: NEGATIVE
TZST-0002SLOWVT: NEGATIVE
TZST-0003FASTVT: 21 J
TZST-0003FASTVT: 31 J
TZST-0003FASTVT: 31 J
VENTRICULAR PACING ICD: 0 pct

## 2011-11-21 NOTE — Patient Instructions (Signed)
Your physician wants you to follow-up in: 12 months with Dr Janalyn Harder Bonita Quin will receive a reminder letter in the mail two months in advance. If you don't receive a letter, please call our office to schedule the follow-up appointment.    Remote monitoring is used to monitor your Pacemaker of ICD from home. This monitoring reduces the number of office visits required to check your device to one time per year. It allows [place] to keep an eye on the functioning of your device to ensure it is working properly. You are scheduled for a device check from home on 02/27/2012. You may send your transmission at any time that day. If you have a wireless device, the transmission will be sent automatically. After your physician reviews your transmission, you will receive a postcard with your next transmission date.

## 2011-11-22 ENCOUNTER — Encounter: Payer: Self-pay | Admitting: Internal Medicine

## 2011-11-22 DIAGNOSIS — I472 Ventricular tachycardia: Secondary | ICD-10-CM | POA: Insufficient documentation

## 2011-11-22 NOTE — Assessment & Plan Note (Signed)
His symptoms have improved with LVAD insertion. He will continue his current medical therapy, maintain a low sodium diet and followup with our heart failure team.

## 2011-11-22 NOTE — Progress Notes (Signed)
HPI [name] returns today for followup. He is a pleasant 51 yo man with a longstanding non-ischemic cardiomyopathy, chronic systolic heart failure, and s/p ICD implant. The patient has had worsening CHF and underwent insertion of an LVAD several months ago. He denies any recent ICD shocks. No chest pain. His CHF symptoms have improved with LVAD insertion. Allergies  Allergen Reactions  . Lexapro (Escitalopram Oxalate) Other (See Comments)    somnolence     Current Outpatient Prescriptions  Medication Sig Dispense Refill  . allopurinol (ZYLOPRIM) 100 MG tablet Take 100 mg by mouth as needed. For gout      . digoxin (LANOXIN) 0.125 MG tablet Take 125 mcg by mouth daily.      . hydrALAZINE (APRESOLINE) 25 MG tablet Take 12.5 mg by mouth 3 (three) times daily.       . potassium chloride SA (K-DUR,KLOR-CON) 20 MEQ tablet Take 20 mEq by mouth daily.       Marland Kitchen torsemide (DEMADEX) 20 MG tablet Take 20 mg by mouth 2 (two) times daily.      Marland Kitchen warfarin (COUMADIN) 5 MG tablet Take 5 mg by mouth at bedtime.      . butalbital-acetaminophen-caffeine (FIORICET) 50-325-40 MG per tablet Take 1 tablet by mouth daily as needed for headache. With limit of 10 per month  10 tablet  1  . DISCONTD: isosorbide mononitrate (IMDUR) 30 MG 24 hr tablet Take 1 tablet (30 mg total) by mouth daily.  60 tablet  6     Past Medical History  Diagnosis Date  . CHF (congestive heart failure)     EF- 10-15  . Medically noncompliant   . Mitral regurgitation   . Tobacco user   . HTN (hypertension)   . Depression   . AICD (automatic cardioverter/defibrillator) present   . GERD (gastroesophageal reflux disease)   . Substance abuse   . Migraines   . Chronic renal insufficiency   . Syncope   . Sleep apnea   . Thrombus 08/06/2010  . SYSTOLIC HEART FAILURE, CHRONIC 09/22/2008    Qualifier: Diagnosis of  By: Gala Romney, MD, Trixie Dredge   . LV (left ventricular) mural thrombus 01/28/2011  . ICD - IN SITU 09/16/2008   Qualifier: Diagnosis of  By: Wonda Amis    . MITRAL STENOSIS/ INSUFFICIENCY, NON-RHEUMATIC 09/22/2008    Qualifier: Diagnosis of  By: Gala Romney, MD, Trixie Dredge Hepatomegaly 09/16/2008    Qualifier: Diagnosis of  By: Wonda Amis    . COMMON MIGRAINE 06/14/2009    Qualifier: Diagnosis of  By: Jonny Ruiz MD, Len Blalock   . Bipolar affective disorder 10/22/2011    ROS:   All systems reviewed and negative except as noted in the HPI.   Past Surgical History  Procedure Date  . Cardiac defibrillator placement      Family History  Problem Relation Age of Onset  . Coronary artery disease Neg Hx      History   Social History  . Marital Status: Divorced    Spouse Name: N/A    Number of Children: N/A  . Years of Education: N/A   Occupational History  . Not on file.   Social History Main Topics  . Smoking status: Former Smoker -- 0.2 packs/day for 20 years    Types: Cigarettes    Quit date: 06/29/2011  . Smokeless tobacco: Former Neurosurgeon    Quit date: 06/29/2011  . Alcohol Use: No  . Drug Use: No  .  Sexually Active: Not on file   Other Topics Concern  . Not on file   Social History Narrative  . No narrative on file     BP 110/80  Pulse 60  Ht 5\' 5"  (1.651 m)  Wt 146 lb 12.8 oz (66.588 kg)  BMI 24.43 kg/m2  Physical Exam:  Well appearing NAD HEENT: Unremarkable Neck:  No JVD, no thyromegally Lungs:  Clear with no wheezes HEART:  Regular rate rhythm, no murmurs, no rubs, no clicks. High frequency motor sound in the chest. Abd:  soft, positive bowel sounds, no organomegally, no rebound, no guarding Ext:  2 plus pulses, no edema, no cyanosis, no clubbing Skin:  No rashes no nodules Neuro:  CN II through XII intact, motor grossly intact   DEVICE  Normal device function.  See PaceArt for details.   Assess/Plan:

## 2011-11-22 NOTE — Assessment & Plan Note (Signed)
His ICD is working normally. Will recheck in several months.  

## 2011-11-27 ENCOUNTER — Emergency Department (HOSPITAL_COMMUNITY)
Admission: EM | Admit: 2011-11-27 | Discharge: 2011-11-27 | Disposition: A | Discharge status: Home / Self Care | Payer: Medicare Other | Attending: Emergency Medicine | Admitting: Emergency Medicine

## 2011-11-27 ENCOUNTER — Emergency Department (HOSPITAL_COMMUNITY): Payer: Medicare Other

## 2011-11-27 ENCOUNTER — Encounter (HOSPITAL_COMMUNITY): Payer: Self-pay

## 2011-11-27 DIAGNOSIS — N189 Chronic kidney disease, unspecified: Secondary | ICD-10-CM | POA: Insufficient documentation

## 2011-11-27 DIAGNOSIS — I471 Supraventricular tachycardia, unspecified: Secondary | ICD-10-CM

## 2011-11-27 DIAGNOSIS — Z79899 Other long term (current) drug therapy: Secondary | ICD-10-CM | POA: Insufficient documentation

## 2011-11-27 DIAGNOSIS — R42 Dizziness and giddiness: Secondary | ICD-10-CM | POA: Insufficient documentation

## 2011-11-27 DIAGNOSIS — R55 Syncope and collapse: Secondary | ICD-10-CM | POA: Insufficient documentation

## 2011-11-27 DIAGNOSIS — R002 Palpitations: Secondary | ICD-10-CM | POA: Insufficient documentation

## 2011-11-27 DIAGNOSIS — Z95810 Presence of automatic (implantable) cardiac defibrillator: Secondary | ICD-10-CM | POA: Insufficient documentation

## 2011-11-27 DIAGNOSIS — Z95818 Presence of other cardiac implants and grafts: Secondary | ICD-10-CM | POA: Insufficient documentation

## 2011-11-27 DIAGNOSIS — I498 Other specified cardiac arrhythmias: Secondary | ICD-10-CM | POA: Insufficient documentation

## 2011-11-27 DIAGNOSIS — I5022 Chronic systolic (congestive) heart failure: Secondary | ICD-10-CM | POA: Insufficient documentation

## 2011-11-27 DIAGNOSIS — Z95811 Presence of heart assist device: Secondary | ICD-10-CM

## 2011-11-27 DIAGNOSIS — I509 Heart failure, unspecified: Secondary | ICD-10-CM | POA: Insufficient documentation

## 2011-11-27 DIAGNOSIS — I129 Hypertensive chronic kidney disease with stage 1 through stage 4 chronic kidney disease, or unspecified chronic kidney disease: Secondary | ICD-10-CM | POA: Insufficient documentation

## 2011-11-27 DIAGNOSIS — F319 Bipolar disorder, unspecified: Secondary | ICD-10-CM | POA: Insufficient documentation

## 2011-11-27 LAB — BASIC METABOLIC PANEL
CO2: 26 mEq/L (ref 19–32)
Chloride: 96 mEq/L (ref 96–112)
GFR calc Af Amer: >90 mL/min (ref >90)
Potassium: 4 mEq/L (ref 3.5–5.1)

## 2011-11-27 LAB — CBC
HCT: 34.2 % — ABNORMAL LOW (ref 39.0–52.0)
Hemoglobin: 11.1 g/dL — ABNORMAL LOW (ref 13.0–17.0)
MCH: 25.2 pg — ABNORMAL LOW (ref 26.0–34.0)
MCHC: 32.5 g/dL (ref 30.0–36.0)
MCV: 77.6 fL — ABNORMAL LOW (ref 78.0–100.0)
RBC: 4.41 MIL/uL (ref 4.22–5.81)

## 2011-11-27 LAB — DIFFERENTIAL
Basophils Absolute: 0 10*3/uL (ref 0.0–0.1)
Basophils Relative: 1 % (ref 0–1)
Lymphocytes Relative: 17 % (ref 12–46)
Neutro Abs: 3.6 10*3/uL (ref 1.7–7.7)
Neutrophils Relative %: 72 % (ref 43–77)

## 2011-11-27 LAB — MAGNESIUM: Magnesium: 1.9 mg/dL (ref 1.5–2.5)

## 2011-11-27 LAB — PHOSPHORUS: Phosphorus: 3.4 mg/dL (ref 2.3–4.6)

## 2011-11-27 LAB — DIGOXIN LEVEL: Digoxin Level: 1.7 ng/mL (ref 0.8–2.0)

## 2011-11-27 LAB — PROTIME-INR
INR: 2.87 — ABNORMAL HIGH (ref 0.00–1.49)
Prothrombin Time: 30.5 seconds — ABNORMAL HIGH (ref 11.6–15.2)

## 2011-11-27 MED ORDER — DEXTROSE 5 % IV SOLN
300.0000 mg | Freq: Once | INTRAVENOUS | Status: AC
  Administered 2011-11-27: 300 mg via INTRAVENOUS
  Filled 2011-11-27: qty 6

## 2011-11-27 NOTE — ED Notes (Signed)
LVAD RN team at bedside with patient.

## 2011-11-27 NOTE — ED Provider Notes (Addendum)
History     CSN: [id number]  Arrival date & time 11/27/11  1331   First MD Initiated Contact with Patient 11/27/11 1343      Chief Complaint  Patient presents with  . Near Syncope    (Consider location/radiation/quality/duration/timing/severity/associated sxs/prior treatment) HPI  11yoM h/o NICM CHF EF 10-15% s/p AICD and LVAD, MS/MR, h/o vtach pw palpitations and lightheadedness since just prior to arrival. Patient states sx occurred suddenly with "I felt like my heart was skipping around". States he was so lightheaded at that time that he could not walk. Denies cp/sob/n/v/diaphoresis. Denies headache, change in vision. Followed by cardiology [facility] as well as [facility]. States he has been compliant with all medications including digoxin.  Past Medical History  Diagnosis Date  . CHF (congestive heart failure)     EF- 10-15  . Medically noncompliant   . Mitral regurgitation   . Tobacco user   . HTN (hypertension)   . Depression   . AICD (automatic cardioverter/defibrillator) present   . GERD (gastroesophageal reflux disease)   . Substance abuse   . Migraines   . Chronic renal insufficiency   . Syncope   . Sleep apnea   . Thrombus 08/06/2010  . SYSTOLIC HEART FAILURE, CHRONIC 09/22/2008    Qualifier: Diagnosis of  By: Gala Romney, MD, Trixie Dredge   . LV (left ventricular) mural thrombus 01/28/2011  . ICD - IN SITU 09/16/2008    Qualifier: Diagnosis of  By: Wonda Amis    . MITRAL STENOSIS/ INSUFFICIENCY, NON-RHEUMATIC 09/22/2008    Qualifier: Diagnosis of  By: Gala Romney, MD, Trixie Dredge Hepatomegaly 09/16/2008    Qualifier: Diagnosis of  By: Wonda Amis    . COMMON MIGRAINE 06/14/2009    Qualifier: Diagnosis of  By: Jonny Ruiz MD, Len Blalock   . Bipolar affective disorder 10/22/2011    Past Surgical History  Procedure Date  . Cardiac defibrillator placement     Family History  Problem Relation Age of Onset  . Coronary artery disease Neg Hx     History    Substance Use Topics  . Smoking status: Former Smoker -- 0.2 packs/day for 20 years    Types: Cigarettes    Quit date: 06/29/2011  . Smokeless tobacco: Former Neurosurgeon    Quit date: 06/29/2011  . Alcohol Use: No    Review of Systems  All other systems reviewed and are negative.  except as noted HPI   Allergies  Lexapro  Home Medications   Current Outpatient Rx  Name Route Sig Dispense Refill  . ALLOPURINOL 100 MG PO TABS Oral Take 100 mg by mouth as needed. For gout    . ASPIRIN EC 81 MG PO TBEC Oral Take 81 mg by mouth daily.    Marland Kitchen BUTALBITAL-APAP-CAFFEINE 50-325-40 MG PO TABS Oral Take 1 tablet by mouth daily as needed. For headache With limit of 10 per month    . CARVEDILOL 25 MG PO TABS Oral Take 25 mg by mouth daily.    . CYCLOBENZAPRINE HCL 5 MG PO TABS Oral Take 5 mg by mouth 3 (three) times daily as needed. For muscle spasms    . DIGOXIN 0.125 MG PO TABS Oral Take 125 mcg by mouth daily.    Marland Kitchen FERROUS GLUCONATE 324 (38 FE) MG PO TABS Oral Take 324 mg by mouth 3 (three) times daily with meals.    Marland Kitchen GABAPENTIN 300 MG PO CAPS Oral Take 300 mg by mouth 3 (three) times  daily.    Marland Kitchen HYDRALAZINE HCL 50 MG PO TABS Oral Take 50 mg by mouth 3 (three) times daily.    Marland Kitchen LISINOPRIL 2.5 MG PO TABS Oral Take 2.5 mg by mouth daily.    Marland Kitchen MAG-OX 400 PO Oral Take 800 mg by mouth daily.    Marland Kitchen POTASSIUM CHLORIDE CRYS ER 20 MEQ PO TBCR Oral Take 20 mEq by mouth daily.     Marland Kitchen RANITIDINE HCL 150 MG PO TABS Oral Take 150 mg by mouth 2 (two) times daily.    . TORSEMIDE 20 MG PO TABS Oral Take 20 mg by mouth daily.     . WARFARIN SODIUM 4 MG PO TABS Oral Take 4 mg by mouth daily.      BP 80/40  Pulse 75  Temp(Src) 98.1 F (36.7 C) (Oral)  Resp 20  SpO2 98%  Physical Exam  Nursing note and vitals reviewed. Constitutional: He is oriented to person, place, and time. He appears well-developed and well-nourished. No distress.  HENT:  Head: Atraumatic.  Mouth/Throat: Oropharynx is clear and moist.   Eyes: Conjunctivae are normal. Pupils are equal, round, and reactive to light.  Neck: Neck supple.  Cardiovascular:       Mechanical   Pulmonary/Chest: Effort normal. No respiratory distress. He has no wheezes. He has no rales.  Abdominal: Soft. Bowel sounds are normal. There is no tenderness. There is no rebound and no guarding.  Musculoskeletal: Normal range of motion. He exhibits no edema and no tenderness.  Neurological: He is alert and oriented to person, place, and time. No cranial nerve deficit. He exhibits normal muscle tone. Coordination normal.       Strength 5/5 all extremities No pronator drift No facial droop   Skin: Skin is warm and dry.  Psychiatric: He has a normal mood and affect.    Date: 11/27/2011  Rate: 73  Rhythm: normal sinus rhythm  QRS Axis: left  Intervals: normal  ST/T Wave abnormalities: nonspecific ST changes and nonspecific T wave changes  Conduction Disutrbances:none  Narrative Interpretation:   Old EKG Reviewed: changes noted   ED Course  Procedures (including critical care time)  Labs Reviewed  CBC - Abnormal; Notable for the following:    Hemoglobin 11.1 (*)    HCT 34.2 (*)    MCV 77.6 (*)    MCH 25.2 (*)    RDW 16.6 (*)    All other components within normal limits  BASIC METABOLIC PANEL - Abnormal; Notable for the following:    Sodium 131 (*)    All other components within normal limits  PROTIME-INR - Abnormal; Notable for the following:    Prothrombin Time 30.5 (*)    INR 2.87 (*)    All other components within normal limits  LACTATE DEHYDROGENASE - Abnormal; Notable for the following:    LDH 368 (*)    All other components within normal limits  DIFFERENTIAL  MAGNESIUM  PHOSPHORUS  DIGOXIN LEVEL   Dg Chest 2 View  11/27/2011  *RADIOLOGY REPORT*  Clinical Data: Headache, tachycardia, LVAD  CHEST - 2 VIEW  Comparison: 09/08/2011  Findings: Cardiomegaly again noted.  The patient is status post median sternotomy.  A left ventricular  assist  device in place is noted.  Small left pleural effusion with left basilar atelectasis. No focal infiltrate.  Bony thorax is stable.  No pulmonary edema. There is a single lead cardiac pacemaker unchanged in position.  IMPRESSION: Cardiomegaly.  Status post median sternotomy.  Stable single  lead cardiac pacemaker position.A left ventricular assist  device in place is noted.  Small left pleural effusion with left basilar atelectasis.  No focal infiltrate or  pulmonary edema.  Original Report Authenticated By: Natasha Mead, M.D.     1. Palpitations   2. LVAD (left ventricular assist device) present   3. Lightheadedness      MDM  CHF s/p LVAD pw palpitations and lightheadedness. AICD interrogated, found to have runs of narrow complex tachycardia. LVAD team at bedside. VSS at this time with BP 80/40 by doppler. D/W LVAD team. Plans to bolus patient with amiodarone and start amiodarone as outpatient prescription, adjust coumadin likely f/u with heart failure clinic if all other labs wnl.       Forbes Cellar, MD 11/27/11 1429  Forbes Cellar, MD 11/27/11 1547

## 2011-11-27 NOTE — H&P (Signed)
Advanced Heart Failure Team Consult Note   Primary Physician:  Primary Cardiologist:  Dr. Gala Romney   HPI:    [name] is 51 y/o African American gentlemen with a longstanding h/o severe CHF due to NICM EF 10-15% (previously on transplant list) who underwent urgent mechanical support implantation at [facility] in March 2013.  He also has  severe MR, mild renal insufficiency, NSVT s/p ICD with LV thrombus on chronic coumadin therapy.    He has been doing well since LVAD implantation.  Was seen in follow up last week at [facility] and his carvedilol was increased 25 mg BID.  He did not get a refill until today so he began the increase this morning.  He denies LVAD alarms.   A little before 1 today he had two episodes where he felt palpitations and dizziness.  He denies syncope or ICD shock.  He states his weight is stable.  He had his ICD interrogated last week when he saw Dr. Ladona Ridgel, he was not having palpitations at that time.  He has had no further palpitations since he arrived to [facility] via EMS.  Pertinent labs include LDH 368, Hgb 11.1, Hct 34.2, INR 2.87 and digoxin 1.7.    His device has been interrogated by [employer] (I reviewed report personally)  and it was notable for several episodes of 10 sec burst of SVT up to 156 bpm.  His LVAD also showed multiple PI events throughout the day but no alarms.    Denies LVAD alarms.  Denies driveline trauma, erythema or drainage.  LVAD interrogate and multiple brief PI events. Speed 9000 Flow 4.2    Reports taking Coumadin as prescribed and adherence to anticoagulation based dietary restrictions.  Denies bright red blood per rectum or melena, no dark urine or hematuria.   Review of Systems: [y] = yes, [ ]  = no   General: Weight gain [ ] ; Weight loss [ ] ; Anorexia [ ] ; Fatigue [ ] ; Fever [ ] ; Chills [ ] ; Weakness [ ]   Cardiac: Chest pain/pressure [ ] ; Resting SOB [ ] ; Exertional SOB [ ] ; Orthopnea [ ] ; Pedal Edema [ ] ; Palpitations [ ] ; Syncope [ ] ;  Presyncope Valentijn.Cake ]; Paroxysmal nocturnal dyspnea[ ]   Pulmonary: Cough [ ] ; Wheezing[ ] ; Hemoptysis[ ] ; Sputum [ ] ; Snoring [ ]   GI: Vomiting[ ] ; Dysphagia[ ] ; Melena[ ] ; Hematochezia [ ] ; Heartburn[ ] ; Abdominal pain [ ] ; Constipation [ ] ; Diarrhea [ ] ; BRBPR [ ]   GU: Hematuria[ ] ; Dysuria [ ] ; Nocturia[ ]   Vascular: Pain in legs with walking [ ] ; Pain in feet with lying flat [ ] ; Non-healing sores [ ] ; Stroke [ ] ; TIA [ ] ; Slurred speech [ ] ;  Neuro: Headaches[ ] ; Vertigo[ ] ; Seizures[ ] ; Paresthesias[ ] ;Blurred vision [ ] ; Diplopia [ ] ; Vision changes [ ]   Ortho/Skin: Arthritis [ ] ; Joint pain [ ] ; Muscle pain [ ] ; Joint swelling [ ] ; Back Pain [ ] ; Rash [ ]   Psych: Depression[ ] ; Anxiety[ ]   Heme: Bleeding problems [ ] ; Clotting disorders [ ] ; Anemia [ ]   Endocrine: Diabetes [ ] ; Thyroid dysfunction[ ]   Home Medications Prior to Admission medications   Medication Sig Start Date End Date Taking? Authorizing Provider  allopurinol (ZYLOPRIM) 100 MG tablet Take 100 mg by mouth as needed. For gout   Yes Historical Provider, MD  aspirin EC 81 MG tablet Take 81 mg by mouth daily.   Yes Historical Provider, MD  butalbital-acetaminophen-caffeine (FIORICET, ESGIC) 50-325-40 MG per tablet Take  1 tablet by mouth daily as needed. For headache With limit of 10 per month 10/22/11 10/21/12 Yes Corwin Levins, MD  carvedilol (COREG) 25 MG tablet Take 25 mg by mouth daily.   Yes Historical Provider, MD  cyclobenzaprine (FLEXERIL) 5 MG tablet Take 5 mg by mouth 3 (three) times daily as needed. For muscle spasms   Yes Historical Provider, MD  digoxin (LANOXIN) 0.125 MG tablet Take 125 mcg by mouth daily. 07/17/11  Yes Dolores Patty, MD  ferrous gluconate (FERGON) 324 MG tablet Take 324 mg by mouth 3 (three) times daily with meals.   Yes Historical Provider, MD  gabapentin (NEURONTIN) 300 MG capsule Take 300 mg by mouth 3 (three) times daily.   Yes Historical Provider, MD  hydrALAZINE (APRESOLINE) 50 MG tablet Take  50 mg by mouth 3 (three) times daily.   Yes Historical Provider, MD  lisinopril (PRINIVIL,ZESTRIL) 2.5 MG tablet Take 2.5 mg by mouth daily.   Yes Historical Provider, MD  Magnesium Oxide (MAG-OX 400 PO) Take 800 mg by mouth daily.   Yes Historical Provider, MD  potassium chloride SA (K-DUR,KLOR-CON) 20 MEQ tablet Take 20 mEq by mouth daily.  08/15/11 08/14/12 Yes Bevelyn Buckles Ad Guttman, MD  ranitidine (ZANTAC) 150 MG tablet Take 150 mg by mouth 2 (two) times daily.   Yes Historical Provider, MD  torsemide (DEMADEX) 20 MG tablet Take 20 mg by mouth daily.  07/17/11  Yes Dolores Patty, MD  warfarin (COUMADIN) 4 MG tablet Take 4 mg by mouth daily.   Yes Historical Provider, MD    Past Medical History: Past Medical History  Diagnosis Date  . CHF (congestive heart failure)     EF- 10-15  . Medically noncompliant   . Mitral regurgitation   . Tobacco user   . HTN (hypertension)   . Depression   . AICD (automatic cardioverter/defibrillator) present   . GERD (gastroesophageal reflux disease)   . Substance abuse   . Migraines   . Chronic renal insufficiency   . Syncope   . Sleep apnea   . Thrombus 08/06/2010  . SYSTOLIC HEART FAILURE, CHRONIC 09/22/2008    Qualifier: Diagnosis of  By: Gala Romney, MD, Trixie Dredge   . LV (left ventricular) mural thrombus 01/28/2011  . ICD - IN SITU 09/16/2008    Qualifier: Diagnosis of  By: Wonda Amis    . MITRAL STENOSIS/ INSUFFICIENCY, NON-RHEUMATIC 09/22/2008    Qualifier: Diagnosis of  By: Gala Romney, MD, Trixie Dredge Hepatomegaly 09/16/2008    Qualifier: Diagnosis of  By: Wonda Amis    . COMMON MIGRAINE 06/14/2009    Qualifier: Diagnosis of  By: Jonny Ruiz MD, Len Blalock   . Bipolar affective disorder 10/22/2011    Past Surgical History: Past Surgical History  Procedure Date  . Cardiac defibrillator placement     Family History: Family History  Problem Relation Age of Onset  . Coronary artery disease Neg Hx     Social  History: History   Social History  . Marital Status: Divorced    Spouse Name: N/A    Number of Children: N/A  . Years of Education: N/A   Social History Main Topics  . Smoking status: Former Smoker -- 0.2 packs/day for 20 years    Types: Cigarettes    Quit date: 06/29/2011  . Smokeless tobacco: Former Neurosurgeon    Quit date: 06/29/2011  . Alcohol Use: No  . Drug Use: No  . Sexually Active: None  Other Topics Concern  . None   Social History Narrative  . None    Allergies:  Allergies  Allergen Reactions  . Lexapro (Escitalopram Oxalate) Other (See Comments)    somnolence    Objective:    Vital Signs:   Temp:  [98.1 F (36.7 C)] 98.1 F (36.7 C) (06/05 1346) Pulse Rate:  [74-79] 74  (06/05 1600) Resp:  [16-20] 20  (06/05 1600) BP: (80)/(40) 80/40 mmHg (06/05 1346) SpO2:  [98 %-100 %] 100 % (06/05 1600)   There were no vitals filed for this visit.  Physical Exam: GENERAL: Well appearingno acute distress. HEENT: normal  NECK: Supple, JVP 6-7.  2+ bilaterally, no bruits.  No lymphadenopathy or thyromegaly appreciated.   CARDIAC: LVAD hum present.  LUNGS:  Clear to auscultation bilaterally.  ABDOMEN:  Soft, round, nontender, positive bowel sounds x4.     LVAD exit site: well-healed and incorporated.  Dressing dry and intact.  No erythema or drainage.  Stabilization device present and accurately applied.  Driveline dressing is being changed daily per sterile technique. EXTREMITIES:  Warm and dry, no cyanosis, clubbing, rash or edema  NEUROLOGIC:  Alert and oriented x 4.  Gait steady.  No aphasia.  No dysarthria.  Affect pleasant.  LVAD INTERROGATION:   HeartMate II LVAD:  Flow 4.2 liters/min, speed 9000, power 5.8, PI 4.4.     Telemetry: SR 70s  Labs: Basic Metabolic Panel:  Lab 11/27/11 1610  NA 131*  K 4.0  CL 96  CO2 26  GLUCOSE 87  BUN 8  CREATININE 0.87  CALCIUM 9.4  MG 1.9  PHOS 3.4    Liver Function Tests: No results found for this basename:  AST:5,ALT:5,ALKPHOS:5,BILITOT:5,PROT:5,ALBUMIN:5 in the last 168 hours No results found for this basename: LIPASE:5,AMYLASE:5 in the last 168 hours No results found for this basename: AMMONIA:3 in the last 168 hours  CBC:  Lab 11/27/11 1343  WBC 5.0  NEUTROABS 3.6  HGB 11.1*  HCT 34.2*  MCV 77.6*  PLT 361    Cardiac Enzymes: No results found for this basename: CKTOTAL:5,CKMB:5,CKMBINDEX:5,TROPONINI:5 in the last 168 hours  BNP: BNP (last 3 results)  Basename 09/08/11 2235 07/06/11 1743 01/28/11 1235  PROBNP 5981.0* 5088.0* 2588.0*    CBG: No results found for this basename: GLUCAP:5 in the last 168 hours  Coagulation Studies:  Basename 11/27/11 1343  LABPROT 30.5*  INR 2.87*    Other results:   Imaging: Dg Chest 2 View  11/27/2011  *RADIOLOGY REPORT*  Clinical Data: Headache, tachycardia, LVAD  CHEST - 2 VIEW  Comparison: 09/08/2011  Findings: Cardiomegaly again noted.  The patient is status post median sternotomy.  A left ventricular assist  device in place is noted.  Small left pleural effusion with left basilar atelectasis. No focal infiltrate.  Bony thorax is stable.  No pulmonary edema. There is a single lead cardiac pacemaker unchanged in position.  IMPRESSION: Cardiomegaly.  Status post median sternotomy.  Stable single lead cardiac pacemaker position.A left ventricular assist  device in place is noted.  Small left pleural effusion with left basilar atelectasis.  No focal infiltrate or  pulmonary edema.  Original Report Authenticated By: Natasha Mead, M.D.        Assessment:   1) SVT 2) Dizziness 3) Chronic Systolic Heart Failure s/p Heartmate 2 LVAD at [facility] 2 months ago      - chronic coumadin use 4) H/o VT 5) Chronic renal failure, stage 3 (stable)  Plan/Discussion:  Guillermo presents with several episodes of symptomatic SVT. Will start amio 200 bid. Will stop digoxin. Cut coumadin back. to 2 mg nightly. Recheck INR on Friday. Follow up in HF  clinic on Tuesday 10:30  Discussed with Bryon Lions at [facility] VAD clinic by phone.    I reviewed the ICD interrogation and LVAD parameters from today, and compared the results to the patient's prior recorded data.  No programming changes were made.  The LVAD is functioning within specified parameters.  The patient performs LVAD self-test daily.  LVAD interrogation was negative for any significant power changes, alarms.  LVAD equipment check completed and is in good working order.  Back-up equipment present.   LVAD education done on emergency procedures and precautions and reviewed exit site care.   Marquis Down,MD 4:17 PM

## 2011-11-27 NOTE — Discharge Instructions (Signed)
Cardiac Arrhythmia Your heart is a muscle that works to pump blood through your body by regular contractions. The beating of your heart is controlled by a system of special pacemaker cells. These cells control the electrical activity of the heart. When the system controlling this regular beating is disturbed, a heart rhythm abnormality (arrhythmia) results. WHEN YOUR HEART SKIPS A BEAT One of the most common and least serious heart arrhythmias is called an ectopic or premature atrial heartbeat (PAC). This may be noticed as a small change in your regular pulse. A PAC originates from the top part (atrium) of the heart. Within the right atrium, the SA node is the area that normally controls the regularity of the heart. PACs occur in heart tissue outside of the SA node region. You may feel this as a skipped beat or heart flutter, especially if several occur in succession or occur frequently.  Another arrhythmia is ventricular premature complex (VCP or PVC). These extra beats start out in the bottom, more muscular chambers of the heart. In most cases a PVC is harmless. If there are underlying causes that are making the heart irritable such as an overactive thyroid or a prior heart attack PVCs may be of more concern. In a few cases, medications to control the heart rhythm may be prescribed. Things to try at home:  Cut down or avoid alcohol, tobacco and caffeine.   Get enough sleep.   Reduce stress.   Exercise more.  WHEN THE HEART BEATS TOO FAST Atrial tachycardia is a fast heart rate, which starts out in the atrium. It may last from minutes to much longer. Your heart may beat 140 to 240 times per minute instead of the normal 60 to 100.  Symptoms include a worried feeling (anxiety) and a sense that your heart is beating fast and hard.   You may be able to stop the fast rate by holding your breath or bearing down as if you were going to have a bowel movement.   This type of fast rate is usually not  dangerous.  Atrial fibrillation and atrial flutter are other fast rhythms that start in the atria. Both conditions keep the atria from filling with enough blood so the heart does not work well.  Symptoms include feeling light-headed or faint.   These fast rates may be the result of heart damage or disease. Too much thyroid hormone may play a role.   There may be no clear cause or it may be from heart disease or damage.   Medication or a special electrical treatment (cardioversion) may be needed to get the heart beating normally.  Ventricular tachycardia is a fast heart rate that starts in the lower muscular chambers (ventricles) This is a serious disorder that requires treatment as soon as possible. You need someone else to get and use a small defibrillator.  Symptoms include collapse, chest pain, or being short of breath.   Treatment may include medication, procedures to improve blood flow to the heart, or an implantable cardiac defibrillator (ICD).  DIAGNOSIS   A cardiogram (EKG or ECG) will be done to see the arrhythmia, as well as lab tests to check the underlying cause.   If the extra beats or fast rate come and go, you may wear a Holter monitor that records your heart rate for a longer period of time.  SEEK MEDICAL CARE IF:  You have irregular or fast heartbeats (palpitations).   You experience skipped beats.   You develop lightheadedness.     You have chest discomfort.   You have shortness of breath.   You have more frequent episodes, if you are already being treated.  SEEK IMMEDIATE MEDICAL CARE IF:   You have severe chest pain, especially if the pain is crushing or pressure-like and spreads to the arms, back, neck, or jaw, or if you have sweating, feeling sick to your stomach (nausea), or shortness of breath. THIS IS AN EMERGENCY. Do not wait to see if the pain will go away. Get medical help at once. Call 911 or 0 (operator). DO NOT drive yourself to the hospital.   You  feel dizzy or faint.   You have episodes of previously documented atrial tachycardia that do not resolve with the techniques your caregiver has taught you.   Irregular or rapid heartbeats begin to occur more often than in the past, especially if they are associated with more pronounced symptoms or of longer duration.  Document Released: 06/10/2005 Document Revised: 05/30/2011 Document Reviewed: 01/27/2008 [employer] Patient Information 2012 [employer]. 

## 2011-11-27 NOTE — ED Notes (Addendum)
Pt with episode of dizziness while standing today just PTA. LVAD in place on arrival. Pt using battery power with extra batteries in ED.  Pt resting in bed at present, A&0x3, smiling, denies pain, denies SOB, denies defibrillator firing during episode. Dr. Hyman Hopes at bedside.

## 2011-11-27 NOTE — ED Notes (Addendum)
LVAD pt, x 2.5 months, had episode of dizziness, weakness, and palpitations lasting 3 mins. With auto pressure 112/80 per ems unable to get manual. Oxygen 100 %.20 g by ems in [place].

## 2011-11-28 ENCOUNTER — Other Ambulatory Visit (HOSPITAL_COMMUNITY): Payer: Self-pay | Admitting: Physician Assistant

## 2011-11-28 DIAGNOSIS — I5020 Unspecified systolic (congestive) heart failure: Secondary | ICD-10-CM

## 2011-12-02 ENCOUNTER — Encounter (HOSPITAL_COMMUNITY): Payer: Self-pay | Admitting: Adult Health

## 2011-12-02 ENCOUNTER — Telehealth (HOSPITAL_COMMUNITY): Payer: Self-pay | Admitting: *Deleted

## 2011-12-02 MED ORDER — TORSEMIDE 20 MG PO TABS: 20.0000 mg | ORAL_TABLET | Freq: Every day | ORAL | Status: DC

## 2011-12-02 NOTE — Telephone Encounter (Signed)
Refill called in for Torsemide. Also reminded him of his 9 am appointment 12/03/11.   Mr Feldhaus appreciated the call back.

## 2011-12-02 NOTE — Telephone Encounter (Signed)
Mr Paris called, he needs a refill asap on torsemide 50 mg. Please call it into [facility] on [address].  Thanks.

## 2011-12-02 NOTE — Progress Notes (Signed)
Patient ID: [name], male   DOB: 06/21/1961, 50 y.o.   MRN: [id number] Faxed lab results to [facility] attention VAD Department.

## 2011-12-03 ENCOUNTER — Encounter: Payer: Self-pay | Admitting: Internal Medicine

## 2011-12-03 ENCOUNTER — Ambulatory Visit (HOSPITAL_COMMUNITY)
Admission: RE | Admit: 2011-12-03 | Discharge: 2011-12-03 | Disposition: A | Discharge status: Home / Self Care | Payer: Medicare Other | Source: Ambulatory Visit | Attending: Internal Medicine | Admitting: Internal Medicine

## 2011-12-03 VITALS — BP 110/1 | HR 73 | Wt 156.8 lb

## 2011-12-03 DIAGNOSIS — Z95811 Presence of heart assist device: Secondary | ICD-10-CM

## 2011-12-03 DIAGNOSIS — I080 Rheumatic disorders of both mitral and aortic valves: Secondary | ICD-10-CM | POA: Insufficient documentation

## 2011-12-03 DIAGNOSIS — I471 Supraventricular tachycardia: Secondary | ICD-10-CM

## 2011-12-03 DIAGNOSIS — I472 Ventricular tachycardia, unspecified: Secondary | ICD-10-CM

## 2011-12-03 DIAGNOSIS — I5020 Unspecified systolic (congestive) heart failure: Secondary | ICD-10-CM

## 2011-12-03 DIAGNOSIS — I517 Cardiomegaly: Secondary | ICD-10-CM

## 2011-12-03 DIAGNOSIS — I379 Nonrheumatic pulmonary valve disorder, unspecified: Secondary | ICD-10-CM | POA: Insufficient documentation

## 2011-12-03 DIAGNOSIS — I509 Heart failure, unspecified: Secondary | ICD-10-CM | POA: Insufficient documentation

## 2011-12-03 DIAGNOSIS — I5022 Chronic systolic (congestive) heart failure: Secondary | ICD-10-CM

## 2011-12-03 DIAGNOSIS — I079 Rheumatic tricuspid valve disease, unspecified: Secondary | ICD-10-CM | POA: Insufficient documentation

## 2011-12-03 MED ORDER — AMIODARONE HCL 200 MG PO TABS: 200.0000 mg | ORAL_TABLET | Freq: Two times a day (BID) | ORAL | Status: DC

## 2011-12-03 MED ORDER — TORSEMIDE 20 MG PO TABS: 20.0000 mg | ORAL_TABLET | Freq: Two times a day (BID) | ORAL | Status: DC

## 2011-12-03 NOTE — Patient Instructions (Signed)
GO PICK UP YOUR PRESCRIPTIONS AMIODARONE AND TORSEMIDE TODAY!!!  Take 2 Torsemide tablets in the am and 1 tablet in the PM for 3 days. Continue to weigh yourself and if weight back down to 145lbs before Thursday go back to regular dose of Torsemide 1 tablet in the am and 1 in the PM.  Follow up on Friday @ 11:30    Do the following things EVERYDAY: 1) Weigh yourself in the morning before breakfast. Write it down and keep it in a log. 2) Take your medicines as prescribed 3) Eat low salt foods--Limit salt (sodium) to 2000 mg per day.  4) Stay as active as you can everyday 5) Limit all fluids for the day to less than 2 liters

## 2011-12-03 NOTE — Assessment & Plan Note (Addendum)
Volume status significantly elevated in setting of medication noncompliance. We says he is out of torsemide. Have discussed importance of medication compliance and weighing daily. He will get meds today. VAD interrogation looks ok - does continue to have multiple brief PI events. Will need to see him back in a few days. Check labs today including INR.

## 2011-12-03 NOTE — Assessment & Plan Note (Addendum)
ICD interrogated in clinic and I reviewed personally. 1 episode brief NSVT. No further SVT or VT. Continue amio.

## 2011-12-03 NOTE — Progress Notes (Signed)
  Echocardiogram 2D Echocardiogram has been performed.  [name] A 12/03/2011, 11:50 AM

## 2011-12-04 ENCOUNTER — Other Ambulatory Visit (HOSPITAL_COMMUNITY): Payer: Self-pay | Admitting: Anesthesiology

## 2011-12-06 ENCOUNTER — Ambulatory Visit (HOSPITAL_COMMUNITY)
Admission: RE | Admit: 2011-12-06 | Discharge: 2011-12-06 | Disposition: A | Discharge status: Home / Self Care | Payer: Medicare Other | Source: Ambulatory Visit | Attending: Internal Medicine | Admitting: Internal Medicine

## 2011-12-06 VITALS — BP 78/1 | Wt 150.5 lb

## 2011-12-06 DIAGNOSIS — I5022 Chronic systolic (congestive) heart failure: Secondary | ICD-10-CM

## 2011-12-06 DIAGNOSIS — I471 Supraventricular tachycardia: Secondary | ICD-10-CM

## 2011-12-06 DIAGNOSIS — I498 Other specified cardiac arrhythmias: Secondary | ICD-10-CM

## 2011-12-06 DIAGNOSIS — I509 Heart failure, unspecified: Secondary | ICD-10-CM | POA: Insufficient documentation

## 2011-12-06 NOTE — Patient Instructions (Addendum)
Will get BMET next week with [facility].   Continue to take medications as prescribed and weigh self daily.  Follow up next week.

## 2011-12-06 NOTE — Assessment & Plan Note (Addendum)
See above for details. LVAD speed turned up to 9200 on primary and back up controller due to multiple PI events.

## 2011-12-06 NOTE — Assessment & Plan Note (Addendum)
He is s/p HM II LVAD at [facility] 3/12. Volume status much improved. NYHA II. Changed LVAD speed to 9200 to try and decrease Aortic valve opening with every beat and reduce frequency of PI events. Patient's weight still up about 4 lbs from baseline, however hard to tell if weight accurate because he weighed today with system controller. Looks ok on exam. Will continue to monitor and reinforced him weighing daily and to call if up more than 3 lbs.   Will follow up next week to see if LVAD speed change has helped with PI events. Will send message to [facility] to get BMET and INR on Tuesday.

## 2011-12-10 ENCOUNTER — Encounter (HOSPITAL_COMMUNITY): Payer: Self-pay

## 2011-12-11 ENCOUNTER — Ambulatory Visit (HOSPITAL_COMMUNITY): Payer: Medicare Other

## 2011-12-12 ENCOUNTER — Encounter (HOSPITAL_COMMUNITY): Payer: Self-pay

## 2011-12-12 ENCOUNTER — Ambulatory Visit (HOSPITAL_COMMUNITY)
Admission: RE | Admit: 2011-12-12 | Discharge: 2011-12-12 | Disposition: A | Discharge status: Home / Self Care | Payer: Medicare Other | Source: Ambulatory Visit | Attending: Internal Medicine | Admitting: Internal Medicine

## 2011-12-12 VITALS — BP 78/1 | HR 87 | Resp 18 | Ht 65.0 in | Wt 147.5 lb

## 2011-12-12 DIAGNOSIS — I471 Supraventricular tachycardia: Secondary | ICD-10-CM

## 2011-12-12 DIAGNOSIS — I5022 Chronic systolic (congestive) heart failure: Secondary | ICD-10-CM

## 2011-12-12 DIAGNOSIS — I509 Heart failure, unspecified: Secondary | ICD-10-CM | POA: Insufficient documentation

## 2011-12-12 DIAGNOSIS — I498 Other specified cardiac arrhythmias: Secondary | ICD-10-CM

## 2011-12-12 NOTE — Patient Instructions (Addendum)
Follow up 6 weeks.   LDH today.

## 2011-12-13 ENCOUNTER — Encounter (HOSPITAL_COMMUNITY): Payer: Medicare Other

## 2011-12-16 ENCOUNTER — Ambulatory Visit: Payer: Self-pay | Admitting: Pharmacist

## 2011-12-16 DIAGNOSIS — I8290 Acute embolism and thrombosis of unspecified vein: Secondary | ICD-10-CM

## 2011-12-18 ENCOUNTER — Encounter (HOSPITAL_COMMUNITY): Payer: Self-pay

## 2011-12-21 NOTE — Progress Notes (Signed)
HPI: [name] is 51 y/o [place] male with a history of severe CHF due to NICM EF 10-15%, and severe MR previously on home milrinone. He had a HM II LVAD placed at [facility] March 2013. His medical history also consists of CRI, NSVT s/p ICD and LV thrombus.    RHC 07/08/11  RA = 7  RV = 45/6/7  PA = 45/31 (35)  PCW = 20  Fick cardiac output/index = 3.06/1.75  PVR = 5.2 Woods  FA sat = 96%  PA sat = 58%, 56%  07/10/11: On milrinone 0.25 mcg/kg/min CO 4.27 and CI 2.36  CPX 2/13: VO2: 23.6 ml/kg/min (65% predicted) VE/VCO2 30.2 RER 1.27  Follow up: Patient is here for first follow up appointment since LVAD implant. Last week patient presented to ED for dizziness. His LVAD was interrogated along with his ICD, which showed multiple PI events most likely related to him going in and out of SVT. Amiodarone bolus given and was started on PO, however has not picked up his Amiodarone. Since ED visit denies any dizziness or heart palpitations. Has been out of his Torsemide since Saturday and reports trying to get refilled from [facility] Reports increase in weight and swelling and thinks r/t hay fever. Weighing regularly and reports 145 with all equipment on. Today weight is up 11 pounds. Taking medications as prescribed.  Denies any syncope, ICD shocks, orthopnea, or dyspnea. + edema.  Denies LVAD alarms.  Denies driveline trauma, erythema or drainage.    Reports taking Coumadin as prescribed and adherence to anticoagulation based dietary restrictions.  Denies bright red blood per rectum or melena, no dark urine or hematuria.    Past Medical History  Diagnosis Date  . CHF (congestive heart failure)     EF- 10-15  . Medically noncompliant   . Mitral regurgitation   . Tobacco user   . HTN (hypertension)   . Depression   . AICD (automatic cardioverter/defibrillator) present   . GERD (gastroesophageal reflux disease)   . Substance abuse   . Migraines   . Chronic renal insufficiency   . Syncope   .  Sleep apnea   . Thrombus 08/06/2010  . SYSTOLIC HEART FAILURE, CHRONIC 09/22/2008    Qualifier: Diagnosis of  By: Gala Romney, MD, Trixie Dredge   . LV (left ventricular) mural thrombus 01/28/2011  . ICD - IN SITU 09/16/2008    Qualifier: Diagnosis of  By: Wonda Amis    . MITRAL STENOSIS/ INSUFFICIENCY, NON-RHEUMATIC 09/22/2008    Qualifier: Diagnosis of  By: Gala Romney, MD, Trixie Dredge Hepatomegaly 09/16/2008    Qualifier: Diagnosis of  By: Wonda Amis    . COMMON MIGRAINE 06/14/2009    Qualifier: Diagnosis of  By: Jonny Ruiz MD, Len Blalock   . Bipolar affective disorder 10/22/2011    Current Outpatient Prescriptions  Medication Sig Dispense Refill  . allopurinol (ZYLOPRIM) 100 MG tablet Take 100 mg by mouth as needed. For gout      . amiodarone (PACERONE) 200 MG tablet Take 1 tablet (200 mg total) by mouth 2 (two) times daily.  60 tablet  6  . aspirin EC 81 MG tablet Take 81 mg by mouth daily.      . butalbital-acetaminophen-caffeine (FIORICET, ESGIC) 50-325-40 MG per tablet Take 1 tablet by mouth daily as needed. For headache With limit of 10 per month       . carvedilol (COREG) 25 MG tablet Take 25 mg by mouth 2 (two) times  daily with a meal.       . ferrous gluconate (FERGON) 324 MG tablet Take 324 mg by mouth 3 (three) times daily with meals.      . gabapentin (NEURONTIN) 300 MG capsule Take 300 mg by mouth 3 (three) times daily.      . hydrALAZINE (APRESOLINE) 50 MG tablet Take 50 mg by mouth 3 (three) times daily.      Marland Kitchen lisinopril (PRINIVIL,ZESTRIL) 2.5 MG tablet Take 2.5 mg by mouth daily.      . Magnesium Oxide (MAG-OX 400 PO) Take 800 mg by mouth 2 (two) times daily.       . potassium chloride SA (K-DUR,KLOR-CON) 20 MEQ tablet Take 20 mEq by mouth daily.       . ranitidine (ZANTAC) 150 MG tablet Take 150 mg by mouth 2 (two) times daily.      Marland Kitchen torsemide (DEMADEX) 20 MG tablet Take 1 tablet (20 mg total) by mouth 2 (two) times daily.  60 tablet  6  . warfarin  (COUMADIN) 4 MG tablet Take 4 mg by mouth daily.      Marland Kitchen DISCONTD: isosorbide mononitrate (IMDUR) 30 MG 24 hr tablet Take 1 tablet (30 mg total) by mouth daily.  60 tablet  6    Lexapro  REVIEW OF SYSTEMS: All systems negative except as listed in HPI, PMH and Problem list.  Physical Exam: Filed Vitals:   12/03/11 1047  BP: 110/1  Pulse: 73  Weight: 156 lb 12 oz (71.101 kg)  SpO2: 97%  MAP 110 no medications  GENERAL: Well appearing, male who presents to clinic today in no acute distress. HEENT: normal  NECK: Supple, JVP 8-9 .  2+ bilaterally, no bruits.  No lymphadenopathy or thyromegaly appreciated.   CARDIAC:  Mechanical heart sounds with LVAD hum present.  LUNGS:  Clear to auscultation bilaterally.  ABDOMEN:  Soft, mild distention, nontender, positive bowel sounds x4.     LVAD exit site: well-healed and incorporated.  Dressing dry and intact.  No erythema or drainage.  Stabilization device present and accurately applied.  EXTREMITIES:  Warm and dry, no cyanosis, clubbing, rash or  1+edema  NEUROLOGIC:  Alert and oriented x 4.  Gait steady.  No aphasia.  No dysarthria.  Affect pleasant.     EKG: NSR with PVCs  LVAD INTERROGATION:   HeartMate II LVAD:  Flow --- liters/min, speed 9000, power 5.8, PI 5..  Controller Serial # [id number].   I reviewed the LVAD parameters today and no programming changes were made.  The LVAD is functioning within specified parameters.  The patient performs LVAD self-test daily.  LVAD interrogation was negative for any significant power changes or alarms. Multiple PI events noted, however none sustained.  LVAD equipment check completed and is in good working order.  Back-up equipment present.    ASSESSMENT AND PLAN:

## 2011-12-21 NOTE — Progress Notes (Signed)
HPI: [name] is 51 y/o [place] male with a history of severe CHF due to NICM EF 10-15%, and severe MR previously on home milrinone. He had a HM II LVAD placed at [facility] March 2013. His medical history also consists of CRI, NSVT s/p ICD and LV thrombus.    RHC 07/08/11  RA = 7  RV = 45/6/7  PA = 45/31 (35)  PCW = 20  Fick cardiac output/index = 3.06/1.75  PVR = 5.2 Woods  FA sat = 96%  PA sat = 58%, 56%  07/10/11: On milrinone 0.25 mcg/kg/min CO 4.27 and CI 2.36  CPX 2/13: VO2: 23.6 ml/kg/min (65% predicted) VE/VCO2 30.2 RER 1.27   Patient reports since last visit he went home and got his medications filled and has been taking them as prescribed. Weight down 6 pounds since Tuesday. Denies any dizziness, orthopnea, or CP. Reports he had some palpitations with his heart the other morning, but he did not get dizzy and it went away.    Denies LVAD alarms.  Denies driveline trauma, erythema or drainage.   Reports taking Coumadin as prescribed.  Denies bright red blood per rectum or melena, no dark urine or hematuria.    Past Medical History  Diagnosis Date  . CHF (congestive heart failure)     EF- 10-15  . Medically noncompliant   . Mitral regurgitation   . Tobacco user   . HTN (hypertension)   . Depression   . AICD (automatic cardioverter/defibrillator) present   . GERD (gastroesophageal reflux disease)   . Substance abuse   . Migraines   . Chronic renal insufficiency   . Syncope   . Sleep apnea   . Thrombus 08/06/2010  . SYSTOLIC HEART FAILURE, CHRONIC 09/22/2008    Qualifier: Diagnosis of  By: Gala Romney, MD, Trixie Dredge   . LV (left ventricular) mural thrombus 01/28/2011  . ICD - IN SITU 09/16/2008    Qualifier: Diagnosis of  By: Wonda Amis    . MITRAL STENOSIS/ INSUFFICIENCY, NON-RHEUMATIC 09/22/2008    Qualifier: Diagnosis of  By: Gala Romney, MD, Trixie Dredge Hepatomegaly 09/16/2008    Qualifier: Diagnosis of  By: Wonda Amis    . COMMON MIGRAINE  06/14/2009    Qualifier: Diagnosis of  By: Jonny Ruiz MD, Len Blalock   . Bipolar affective disorder 10/22/2011    Current Outpatient Prescriptions  Medication Sig Dispense Refill  . allopurinol (ZYLOPRIM) 100 MG tablet Take 100 mg by mouth as needed. For gout      . amiodarone (PACERONE) 200 MG tablet Take 1 tablet (200 mg total) by mouth 2 (two) times daily.  60 tablet  6  . aspirin EC 81 MG tablet Take 81 mg by mouth daily.      . butalbital-acetaminophen-caffeine (FIORICET, ESGIC) 50-325-40 MG per tablet Take 1 tablet by mouth daily as needed. For headache With limit of 10 per month       . carvedilol (COREG) 25 MG tablet Take 25 mg by mouth 2 (two) times daily with a meal.       . ferrous gluconate (FERGON) 324 MG tablet Take 324 mg by mouth 3 (three) times daily with meals.      . gabapentin (NEURONTIN) 300 MG capsule Take 300 mg by mouth 3 (three) times daily.      . hydrALAZINE (APRESOLINE) 50 MG tablet Take 50 mg by mouth 3 (three) times daily.      Marland Kitchen lisinopril (PRINIVIL,ZESTRIL) 2.5 MG  tablet Take 2.5 mg by mouth daily.      . Magnesium Oxide (MAG-OX 400 PO) Take 800 mg by mouth 2 (two) times daily.       . potassium chloride SA (K-DUR,KLOR-CON) 20 MEQ tablet Take 20 mEq by mouth daily.       . ranitidine (ZANTAC) 150 MG tablet Take 150 mg by mouth 2 (two) times daily.      Marland Kitchen torsemide (DEMADEX) 20 MG tablet Take 1 tablet (20 mg total) by mouth 2 (two) times daily.  60 tablet  6  . warfarin (COUMADIN) 4 MG tablet Take 4 mg by mouth daily.      Marland Kitchen DISCONTD: isosorbide mononitrate (IMDUR) 30 MG 24 hr tablet Take 1 tablet (30 mg total) by mouth daily.  60 tablet  6    Lexapro  REVIEW OF SYSTEMS: All systems negative except as listed in HPI, PMH and Problem list.  Physical Exam: Filed Vitals:   12/03/11 1047  BP: 110/1  Pulse: 73  Weight: 156 lb 12 oz (71.101 kg)  SpO2: 97%  MAP 110 no medications  GENERAL: Well appearing, male who presents to clinic today in no acute  distress. HEENT: normal  NECK: Supple, JVP 5-6 .  2+ bilaterally, no bruits.  No lymphadenopathy or thyromegaly appreciated.   CARDIAC:  Mechanical heart sounds with LVAD hum present.  LUNGS:  Clear to auscultation bilaterally.  ABDOMEN:  Soft, nondistended, nontender, positive bowel sounds x4.     LVAD exit site: well-healed and incorporated.  Dressing dry and intact.  No erythema or drainage.  Stabilization device present and accurately applied.  EXTREMITIES:  Warm and dry, no cyanosis, clubbing, rash or  1+edema  NEUROLOGIC:  Alert and oriented x 4.  Gait steady.  No aphasia.  No dysarthria.  Affect pleasant.     LVAD INTERROGATION:   HeartMate II LVAD:  Flow 4.2 liters/min, speed 9000, power 5.8, PI 5..  Controller Serial # [id number].    The LVAD is functioning within specified parameters. Reviewed ECHO personally and aortic valve opening partially with every beat. Discussion with [facility] about increasing speed on pump to try and decrease the aortic valve opening so frequently, and they agreed. Pump speed turned up to 9200 and changed speed on back-up controller too. The patient performs LVAD self-test daily.  LVAD interrogation was negative for any significant power changes or alarms. Multiple PI events noted, however none sustained.  LVAD equipment check completed and is in good working order.  Back-up equipment present.    ICD Interrogated in clinic and I reviewed personally and showed brief run of NSVT.    ASSESSMENT AND PLAN:

## 2011-12-29 NOTE — Assessment & Plan Note (Signed)
Appears quiescent. Continue amiodarone.

## 2011-12-29 NOTE — Progress Notes (Signed)
HPI: [name] is 51 y/o [place] male with a history of severe CHF due to NICM EF 10-15%, and severe MR previously on home milrinone. He had a HM II LVAD placed at [facility] March 2013. His medical history also consists of CRI, NSVT s/p ICD and LV thrombus.    RHC 07/08/11  RA = 7  RV = 45/6/7  PA = 45/31 (35)  PCW = 20  Fick cardiac output/index = 3.06/1.75  PVR = 5.2 Woods  FA sat = 96%  PA sat = 58%, 56%  07/10/11: On milrinone 0.25 mcg/kg/min CO 4.27 and CI 2.36  CPX 2/13: VO2: 23.6 ml/kg/min (65% predicted) VE/VCO2 30.2 RER 1.27  Follow up: Patient reports since last visit he went home and got his medications filled and has been taking them as prescribed. Weight down 6 pounds since Tuesday. Denies any dizziness, orthopnea, or CP. Reports he had some palpitations with his heart the other morning, but he did not get dizzy and it went away.    Denies LVAD alarms.  Denies driveline trauma, erythema or drainage.   Reports taking Coumadin as prescribed.  Denies bright red blood per rectum or melena, no dark urine or hematuria.    Past Medical History  Diagnosis Date  . CHF (congestive heart failure)     EF- 10-15  . Medically noncompliant   . Mitral regurgitation   . Tobacco user   . HTN (hypertension)   . Depression   . AICD (automatic cardioverter/defibrillator) present   . GERD (gastroesophageal reflux disease)   . Substance abuse   . Migraines   . Chronic renal insufficiency   . Syncope   . Sleep apnea   . Thrombus 08/06/2010  . SYSTOLIC HEART FAILURE, CHRONIC 09/22/2008    Qualifier: Diagnosis of  By: Gala Romney, MD, Trixie Dredge   . LV (left ventricular) mural thrombus 01/28/2011  . ICD - IN SITU 09/16/2008    Qualifier: Diagnosis of  By: Wonda Amis    . MITRAL STENOSIS/ INSUFFICIENCY, NON-RHEUMATIC 09/22/2008    Qualifier: Diagnosis of  By: Gala Romney, MD, Trixie Dredge Hepatomegaly 09/16/2008    Qualifier: Diagnosis of  By: Wonda Amis    . COMMON  MIGRAINE 06/14/2009    Qualifier: Diagnosis of  By: Jonny Ruiz MD, Len Blalock   . Bipolar affective disorder 10/22/2011    Current Outpatient Prescriptions  Medication Sig Dispense Refill  . allopurinol (ZYLOPRIM) 100 MG tablet Take 100 mg by mouth as needed. For gout      . amiodarone (PACERONE) 200 MG tablet Take 1 tablet (200 mg total) by mouth 2 (two) times daily.  60 tablet  6  . aspirin EC 81 MG tablet Take 81 mg by mouth daily.      . butalbital-acetaminophen-caffeine (FIORICET, ESGIC) 50-325-40 MG per tablet Take 1 tablet by mouth daily as needed. For headache With limit of 10 per month       . carvedilol (COREG) 25 MG tablet Take 25 mg by mouth 2 (two) times daily with a meal.       . ferrous gluconate (FERGON) 324 MG tablet Take 324 mg by mouth 3 (three) times daily with meals.      . gabapentin (NEURONTIN) 300 MG capsule Take 300 mg by mouth 3 (three) times daily.      . hydrALAZINE (APRESOLINE) 50 MG tablet Take 50 mg by mouth 3 (three) times daily.      Marland Kitchen lisinopril (PRINIVIL,ZESTRIL) 2.5  MG tablet Take 2.5 mg by mouth daily.      . Magnesium Oxide (MAG-OX 400 PO) Take 800 mg by mouth 2 (two) times daily.       . potassium chloride SA (K-DUR,KLOR-CON) 20 MEQ tablet Take 20 mEq by mouth daily.       . ranitidine (ZANTAC) 150 MG tablet Take 150 mg by mouth 2 (two) times daily.      Marland Kitchen torsemide (DEMADEX) 20 MG tablet Take 1 tablet (20 mg total) by mouth 2 (two) times daily.  60 tablet  6  . warfarin (COUMADIN) 4 MG tablet Take 4 mg by mouth daily.      Marland Kitchen DISCONTD: isosorbide mononitrate (IMDUR) 30 MG 24 hr tablet Take 1 tablet (30 mg total) by mouth daily.  60 tablet  6    Lexapro  REVIEW OF SYSTEMS: All systems negative except as listed in HPI, PMH and Problem list.  Physical Exam: Filed Vitals:   12/06/11 1119  BP: 78/1  Weight: 150 lb 8 oz (68.266 kg)  MAP 110 no medications  GENERAL: Well appearing, male who presents to clinic today in no acute distress. HEENT: normal  NECK:  Supple, JVP 5-6 .  2+ bilaterally, no bruits.  No lymphadenopathy or thyromegaly appreciated.   CARDIAC:  Mechanical heart sounds with LVAD hum present.  LUNGS:  Clear to auscultation bilaterally.  ABDOMEN:  Soft, nondistended, nontender, positive bowel sounds x4.     LVAD exit site: well-healed and incorporated.  Dressing dry and intact.  No erythema or drainage.  Stabilization device present and accurately applied.  EXTREMITIES:  Warm and dry, no cyanosis, clubbing, rash or  1+edema  NEUROLOGIC:  Alert and oriented x 4.  Gait steady.  No aphasia.  No dysarthria.  Affect pleasant.       ASSESSMENT AND PLAN:   LVAD INTERROGATION:   HeartMate II LVAD:  Flow 4.2 liters/min, speed 9000, power 5.8, PI 5..  Controller Serial # [id number].    The LVAD is functioning within specified parameters. ECHO from Tuesday reviewed and aortic valve is opening partially with every beat. Discussion with [facility] about increasing speed on pump to try and decrease the aortic valve opening so frequently, and they agreed. Pump speed turned up to 9200 and changed speed on back-up controller too. The patient performs LVAD self-test daily.  LVAD interrogation was negative for any significant power changes or alarms. Multiple PI events noted, however none sustained.  LVAD equipment check completed and is in good working order.  Back-up equipment present.

## 2011-12-30 NOTE — Assessment & Plan Note (Signed)
[name] is doing well today. Volume status looks good. Need for medication compliance stressed. LVAD interrogated and no major alarms. Drive line looks good.

## 2011-12-30 NOTE — Progress Notes (Signed)
HPI:  [name] is 51 y/o [place] male with a history of severe CHF due to NICM EF 10-15%, and severe MR previously on home milrinone. He had a HM II LVAD placed at [facility] March 2013. His medical history also consists of CRI, NSVT s/p ICD and LV thrombus.   Evaluated in the ER on 6/5 for Dizziness.  Device interrogated revealing several episodes of SVT lasting 10-15 seconds.  LVAD showed multiple PI events.  Amiodarone bolus given and amio started.    He returns for 1 week follow up.  His LVAD speed was increased from 9000 to 9200 to try and decrease aortic valve opening and help with multiple PI events.  He feels good today.  He started with a cough and scratchy throat 4-5 days ago, has been taking over the counter Dayquil.  Feels it is getting better.  Had chills one day but these have resolved. No fever. States he has been compliant with his meds.  Gets dizzy when he coughs.  Denies orthopnea/PND. Has follow up with [facility] on 7/10.   6/19 labs: INR 1.34, Cr 1.24  Denies LVAD alarms.  Denies driveline trauma, erythema or drainage.    Reports taking Coumadin as prescribed.  Denies bright red blood per rectum or melena, no dark urine or hematuria.    LVAD INTERROGATION:   HeartMate II LVAD:  Flow 4.4 liters/min, speed 9200, power 5.7, PI 5.1.  Controller Serial # [id number].   ICD interrogated today, no events.  Past Medical History  Diagnosis Date  . CHF (congestive heart failure)     EF- 10-15  . Medically noncompliant   . Mitral regurgitation   . Tobacco user   . HTN (hypertension)   . Depression   . AICD (automatic cardioverter/defibrillator) present   . GERD (gastroesophageal reflux disease)   . Substance abuse   . Migraines   . Chronic renal insufficiency   . Syncope   . Sleep apnea   . Thrombus 08/06/2010  . SYSTOLIC HEART FAILURE, CHRONIC 09/22/2008    Qualifier: Diagnosis of  By: Gala Romney, MD, Trixie Dredge   . LV (left ventricular) mural thrombus 01/28/2011  . ICD - IN  SITU 09/16/2008    Qualifier: Diagnosis of  By: Wonda Amis    . MITRAL STENOSIS/ INSUFFICIENCY, NON-RHEUMATIC 09/22/2008    Qualifier: Diagnosis of  By: Gala Romney, MD, Trixie Dredge Hepatomegaly 09/16/2008    Qualifier: Diagnosis of  By: Wonda Amis    . COMMON MIGRAINE 06/14/2009    Qualifier: Diagnosis of  By: Jonny Ruiz MD, Len Blalock   . Bipolar affective disorder 10/22/2011    Current Outpatient Prescriptions  Medication Sig Dispense Refill  . allopurinol (ZYLOPRIM) 100 MG tablet Take 100 mg by mouth as needed. For gout      . amiodarone (PACERONE) 200 MG tablet Take 1 tablet (200 mg total) by mouth 2 (two) times daily.  60 tablet  6  . aspirin EC 81 MG tablet Take 81 mg by mouth daily.      . carvedilol (COREG) 25 MG tablet Take 25 mg by mouth 2 (two) times daily with a meal.       . ferrous gluconate (FERGON) 324 MG tablet Take 324 mg by mouth 3 (three) times daily with meals.      . hydrALAZINE (APRESOLINE) 50 MG tablet Take 50 mg by mouth 3 (three) times daily.      Marland Kitchen lisinopril (PRINIVIL,ZESTRIL) 2.5 MG tablet  Take 2.5 mg by mouth daily.      . Magnesium Oxide (MAG-OX 400 PO) Take 800 mg by mouth 2 (two) times daily.       . potassium chloride SA (K-DUR,KLOR-CON) 20 MEQ tablet Take 20 mEq by mouth daily.       . ranitidine (ZANTAC) 150 MG tablet Take 150 mg by mouth 2 (two) times daily.      Marland Kitchen torsemide (DEMADEX) 20 MG tablet Take 1 tablet (20 mg total) by mouth 2 (two) times daily.  60 tablet  6  . warfarin (COUMADIN) 4 MG tablet Take 4 mg by mouth daily.      . butalbital-acetaminophen-caffeine (FIORICET, ESGIC) 50-325-40 MG per tablet Take 1 tablet by mouth daily as needed. For headache With limit of 10 per month       . gabapentin (NEURONTIN) 300 MG capsule Take 300 mg by mouth 3 (three) times daily.      Marland Kitchen DISCONTD: isosorbide mononitrate (IMDUR) 30 MG 24 hr tablet Take 1 tablet (30 mg total) by mouth daily.  60 tablet  6   Allergies: Lexapro  REVIEW OF  SYSTEMS: All systems negative except as listed in HPI, PMH and Problem list.  Physical Exam: Filed Vitals:   12/12/11 1235  BP: 78/1  Pulse: 87  Resp: 18  Height: 5\' 5"  (1.651 m)  Weight: 147 lb 8 oz (66.906 kg)  SpO2: 98%  MAP 110 no medications  GENERAL: Well appearing, no acute distress. HEENT: normal  NECK: Supple, JVP flat .  2+ bilaterally, no bruits.  No lymphadenopathy or thyromegaly appreciated.   CARDIAC:  Mechanical heart sounds with LVAD hum present.  LUNGS:  Clear to auscultation bilaterally.  ABDOMEN:  Soft, nondistended, nontender, positive bowel sounds x4.     LVAD exit site: well-healed and incorporated.  Dressing dry and intact.  No erythema or drainage.  Stabilization device present and accurately applied.  EXTREMITIES:  Warm and dry, no cyanosis, clubbing, rash, edema  NEUROLOGIC:  Alert and oriented x 4.  Gait steady.  No aphasia.  No dysarthria.  Affect pleasant.       ASSESSMENT AND PLAN:

## 2011-12-30 NOTE — Assessment & Plan Note (Addendum)
ICD interrogated in clinic and reviewed personally. SVT and NSVT are wuiescent. Continue amiodarone.

## 2012-01-07 ENCOUNTER — Other Ambulatory Visit: Payer: Medicare Other

## 2012-01-07 ENCOUNTER — Telehealth (HOSPITAL_COMMUNITY): Payer: Self-pay | Admitting: *Deleted

## 2012-01-07 DIAGNOSIS — I5022 Chronic systolic (congestive) heart failure: Secondary | ICD-10-CM

## 2012-01-07 NOTE — Telephone Encounter (Signed)
Received fax from [facility], pt will need weekly INR with results faxed to [phone] [facility], spoke w/pt he is aware and appt set for every Tuesday he will go today

## 2012-01-14 ENCOUNTER — Other Ambulatory Visit (INDEPENDENT_AMBULATORY_CARE_PROVIDER_SITE_OTHER): Payer: Medicare Other

## 2012-01-14 ENCOUNTER — Telehealth: Payer: Self-pay | Admitting: Internal Medicine

## 2012-01-14 DIAGNOSIS — R0602 Shortness of breath: Secondary | ICD-10-CM

## 2012-01-14 DIAGNOSIS — I5022 Chronic systolic (congestive) heart failure: Secondary | ICD-10-CM

## 2012-01-14 LAB — PROTIME-INR
INR: 1.6 ratio — ABNORMAL HIGH (ref 0.8–1.0)
Prothrombin Time: 17.5 s — ABNORMAL HIGH (ref 10.2–12.4)

## 2012-01-14 MED ORDER — CARVEDILOL 25 MG PO TABS: 25.0000 mg | ORAL_TABLET | Freq: Two times a day (BID) | ORAL | Status: DC

## 2012-01-14 MED ORDER — GABAPENTIN 300 MG PO CAPS: 300.0000 mg | ORAL_CAPSULE | Freq: Three times a day (TID) | ORAL | Status: DC

## 2012-01-14 MED ORDER — HYDRALAZINE HCL 50 MG PO TABS: 50.0000 mg | ORAL_TABLET | Freq: Three times a day (TID) | ORAL | Status: DC

## 2012-01-14 NOTE — Telephone Encounter (Signed)
Contacted pharmacy to verify that Rx were sent to pharmacy. Called patient to advise that Rx has been filled.

## 2012-01-14 NOTE — Telephone Encounter (Signed)
PT. called and need  meds refilled- cavedilol-(25mg ).//hydralazine-(50mg )//gabapentin-(300mg )//. Pt stated he needs really soon. The number he said called once filled is [phone]. Thanks.

## 2012-01-14 NOTE — Telephone Encounter (Signed)
New Problem:    Patient called in needing a refill of his carvedilol (COREG) 25 MG tablet and gabapentin (NEURONTIN) 300 MG capsule.  Please call once the order has been placed.

## 2012-01-14 NOTE — Telephone Encounter (Signed)
Pt aware prescriptions sent in 

## 2012-01-21 ENCOUNTER — Other Ambulatory Visit: Payer: Medicare Other

## 2012-01-22 ENCOUNTER — Other Ambulatory Visit (INDEPENDENT_AMBULATORY_CARE_PROVIDER_SITE_OTHER): Payer: Medicare Other

## 2012-01-22 DIAGNOSIS — I5022 Chronic systolic (congestive) heart failure: Secondary | ICD-10-CM

## 2012-01-22 DIAGNOSIS — I509 Heart failure, unspecified: Secondary | ICD-10-CM

## 2012-01-22 LAB — PROTIME-INR
INR: 1.8 ratio — ABNORMAL HIGH (ref 0.8–1.0)
Prothrombin Time: 20.1 s — ABNORMAL HIGH (ref 10.2–12.4)

## 2012-01-28 ENCOUNTER — Other Ambulatory Visit (INDEPENDENT_AMBULATORY_CARE_PROVIDER_SITE_OTHER): Payer: Medicare Other

## 2012-01-28 DIAGNOSIS — I5022 Chronic systolic (congestive) heart failure: Secondary | ICD-10-CM

## 2012-01-28 LAB — PROTIME-INR: Prothrombin Time: 37.8 s — ABNORMAL HIGH (ref 10.2–12.4)

## 2012-02-04 ENCOUNTER — Other Ambulatory Visit (INDEPENDENT_AMBULATORY_CARE_PROVIDER_SITE_OTHER): Payer: Medicare Other

## 2012-02-04 DIAGNOSIS — I5022 Chronic systolic (congestive) heart failure: Secondary | ICD-10-CM

## 2012-02-06 ENCOUNTER — Other Ambulatory Visit (INDEPENDENT_AMBULATORY_CARE_PROVIDER_SITE_OTHER): Payer: Medicare Other

## 2012-02-06 DIAGNOSIS — I5022 Chronic systolic (congestive) heart failure: Secondary | ICD-10-CM

## 2012-02-06 LAB — PROTIME-INR: Prothrombin Time: 58.7 s (ref 10.2–12.4)

## 2012-02-10 ENCOUNTER — Other Ambulatory Visit (INDEPENDENT_AMBULATORY_CARE_PROVIDER_SITE_OTHER): Payer: Medicare Other

## 2012-02-10 DIAGNOSIS — I5022 Chronic systolic (congestive) heart failure: Secondary | ICD-10-CM

## 2012-02-10 LAB — PROTIME-INR: INR: 1.3 ratio — ABNORMAL HIGH (ref 0.8–1.0)

## 2012-02-11 ENCOUNTER — Other Ambulatory Visit: Payer: Medicare Other

## 2012-02-14 ENCOUNTER — Other Ambulatory Visit (INDEPENDENT_AMBULATORY_CARE_PROVIDER_SITE_OTHER): Payer: Medicare Other

## 2012-02-14 DIAGNOSIS — I5022 Chronic systolic (congestive) heart failure: Secondary | ICD-10-CM

## 2012-02-14 LAB — PROTIME-INR
INR: 1.3 ratio — ABNORMAL HIGH (ref 0.8–1.0)
Prothrombin Time: 14.8 s — ABNORMAL HIGH (ref 10.2–12.4)

## 2012-02-18 ENCOUNTER — Other Ambulatory Visit: Payer: Medicare Other

## 2012-02-21 ENCOUNTER — Ambulatory Visit (HOSPITAL_COMMUNITY)
Admission: RE | Admit: 2012-02-21 | Discharge: 2012-02-21 | Disposition: A | Discharge status: Home / Self Care | Payer: Medicare Other | Source: Ambulatory Visit | Attending: Internal Medicine | Admitting: Internal Medicine

## 2012-02-21 VITALS — BP 108/1 | Wt 164.4 lb

## 2012-02-21 DIAGNOSIS — Z95818 Presence of other cardiac implants and grafts: Secondary | ICD-10-CM | POA: Insufficient documentation

## 2012-02-21 DIAGNOSIS — I472 Ventricular tachycardia, unspecified: Secondary | ICD-10-CM | POA: Insufficient documentation

## 2012-02-21 DIAGNOSIS — I4729 Other ventricular tachycardia: Secondary | ICD-10-CM | POA: Insufficient documentation

## 2012-02-21 DIAGNOSIS — Z95811 Presence of heart assist device: Secondary | ICD-10-CM

## 2012-02-21 DIAGNOSIS — I5022 Chronic systolic (congestive) heart failure: Secondary | ICD-10-CM | POA: Insufficient documentation

## 2012-02-21 LAB — COMPREHENSIVE METABOLIC PANEL
ALT: 185 U/L — ABNORMAL HIGH (ref 0–53)
AST: 135 U/L — ABNORMAL HIGH (ref 0–37)
CO2: 24 mEq/L (ref 19–32)
Calcium: 9.6 mg/dL (ref 8.4–10.5)
GFR calc non Af Amer: 69 mL/min — ABNORMAL LOW (ref >90)
Potassium: 4.2 mEq/L (ref 3.5–5.1)
Sodium: 136 mEq/L (ref 135–145)
Total Protein: 8.4 g/dL — ABNORMAL HIGH (ref 6.0–8.3)

## 2012-02-21 LAB — PROTIME-INR: Prothrombin Time: 19.1 seconds — ABNORMAL HIGH (ref 11.6–15.2)

## 2012-02-21 LAB — CBC
MCH: 26.4 pg (ref 26.0–34.0)
Platelets: 292 10*3/uL (ref 150–400)
RBC: 4.88 MIL/uL (ref 4.22–5.81)
WBC: 5 10*3/uL (ref 4.0–10.5)

## 2012-02-21 LAB — LACTATE DEHYDROGENASE: LDH: 498 U/L — ABNORMAL HIGH (ref 94–250)

## 2012-02-21 MED ORDER — HYDRALAZINE HCL 50 MG PO TABS: 75.0000 mg | ORAL_TABLET | Freq: Three times a day (TID) | ORAL | Status: DC

## 2012-02-21 NOTE — Assessment & Plan Note (Addendum)
From a symptomatic point he is much improved s/p VAD. NYHA II. Volume status looks good off diuretic. Will stop patient's digoxin and potassium. MAP increased 108 and numerous PI events, will increase hydralazine to 75 mg TID - can add amlodipine as needed. Creatinine and LDH improved from 1.7 to 1.2 and LDH 498 < 518. Awaiting INR. LFTs still increased will continue to monitor - amio has been stopped by [facility]. Follow up in 3 weeks. Re-stressed importance of adherence to Coumadin and taking medications as prescribed. Went over in depth what foods have high vitamin K. Also had long talk about need for strict compliance with visits and recommendations as precursor to any further consideration of transplant.

## 2012-02-21 NOTE — Patient Instructions (Signed)
Increase Hydralazine to 75 mg (1 1/2 tablets) three times a day.  Stop taking Potassium.  Stop taking Digoxin.  Will call with lab results.  Follow up 3 weeks.

## 2012-02-21 NOTE — Assessment & Plan Note (Addendum)
S/p March 2013. Patient doing well and driveline exit site looks good. Pending labs.

## 2012-02-22 NOTE — Progress Notes (Signed)
HPI: [name] is 51 y/o [place] American male with a history of severe CHF due to NICM EF 10-15%, and severe MR previously on home milrinone. He had a HM II LVAD placed at [facility] March 2013. His medical history also consists of CRI, NSVT s/p ICD and LV thrombus.   Follow up: Patient reports feeling really good and is getting around well. Weight stable at home and taking medications as prescribed. Taking Lovenox currently because of INR being low. Denies any SOB/orthopnea/ or edema. + dizziness occasionally. Is currently trying to get a job.  Seen recently at [facility]. [facility] has been worried about patient because INR has been low and patient missed two days of lovenox. Creatinine has been rising along with LFT's  Denies LVAD alarms.  Denies driveline trauma, erythema or drainage.  Denies ICD shocks.   Reports taking Coumadin as prescribed and adherence to anticoagulation based dietary restrictions.  Denies bright red blood per rectum or melena, no dark urine or hematuria.    Past Medical History  Diagnosis Date  . CHF (congestive heart failure)     EF- 10-15  . Medically noncompliant   . Mitral regurgitation   . Tobacco user   . HTN (hypertension)   . Depression   . AICD (automatic cardioverter/defibrillator) present   . GERD (gastroesophageal reflux disease)   . Substance abuse   . Migraines   . Chronic renal insufficiency   . Syncope   . Sleep apnea   . Thrombus 08/06/2010  . SYSTOLIC HEART FAILURE, CHRONIC 09/22/2008    Qualifier: Diagnosis of  By: Gala Romney, MD, Trixie Dredge   . LV (left ventricular) mural thrombus 01/28/2011  . ICD - IN SITU 09/16/2008    Qualifier: Diagnosis of  By: Wonda Amis    . MITRAL STENOSIS/ INSUFFICIENCY, NON-RHEUMATIC 09/22/2008    Qualifier: Diagnosis of  By: Gala Romney, MD, Trixie Dredge Hepatomegaly 09/16/2008    Qualifier: Diagnosis of  By: Wonda Amis    . COMMON MIGRAINE 06/14/2009    Qualifier: Diagnosis of  By: Jonny Ruiz MD, Len Blalock     . Bipolar affective disorder 10/22/2011    Current Outpatient Prescriptions  Medication Sig Dispense Refill  . allopurinol (ZYLOPRIM) 100 MG tablet Take 100 mg by mouth as needed. For gout      . aspirin EC 81 MG tablet Take 81 mg by mouth daily.      . carvedilol (COREG) 25 MG tablet Take 1 tablet (25 mg total) by mouth 2 (two) times daily with a meal.  60 tablet  6  . enoxaparin (LOVENOX) 80 MG/0.8ML injection Inject 80 mg into the skin every 12 (twelve) hours.      . ferrous gluconate (FERGON) 324 MG tablet Take 324 mg by mouth 3 (three) times daily with meals.      . gabapentin (NEURONTIN) 300 MG capsule Take 1 capsule (300 mg total) by mouth 3 (three) times daily.  90 capsule  6  . hydrALAZINE (APRESOLINE) 50 MG tablet Take 1.5 tablets (75 mg total) by mouth 3 (three) times daily.  90 tablet  6  . lisinopril (PRINIVIL,ZESTRIL) 2.5 MG tablet Take 2.5 mg by mouth daily.      . Magnesium Oxide (MAG-OX 400 PO) Take 800 mg by mouth 2 (two) times daily.       . ranitidine (ZANTAC) 150 MG tablet Take 150 mg by mouth 2 (two) times daily.      Marland Kitchen warfarin (COUMADIN) 4 MG  tablet Take 4 mg by mouth daily.      . butalbital-acetaminophen-caffeine (FIORICET, ESGIC) 50-325-40 MG per tablet Take 1 tablet by mouth daily as needed. For headache With limit of 10 per month       . DISCONTD: isosorbide mononitrate (IMDUR) 30 MG 24 hr tablet Take 1 tablet (30 mg total) by mouth daily.  60 tablet  6    Lexapro  REVIEW OF SYSTEMS: All systems negative except as listed in HPI, PMH and Problem list.   LVAD INTERROGATION:   HeartMate II LVAD:  Flow 4.7 liters/min, speed 9200, power 5.8 , PI 6.0.    I reviewed the LVAD parameters from today, and compared the results to the patient's prior recorded data.  No programming changes were made.  The LVAD is functioning within specified parameters.  The patient performs LVAD self-test daily.  LVAD interrogation was negative for any significant power changes or alarms.  Multiple PI events, probably due to MAP being increased.LVAD equipment check completed and is in good working order.  Back-up equipment present.   LVAD education done on emergency procedures and precautions and reviewed exit site care.   Physical Exam: Filed Vitals:   02/21/12 0955  BP: 108/1  Weight: 164 lb 6.4 oz (74.571 kg)    GENERAL: Well appearing, male who presents to clinic today in no acute distress. HEENT: normal  NECK: Supple, JVP 5-6  .  2+ bilaterally, no bruits.  No lymphadenopathy or thyromegaly appreciated.   CARDIAC:  Mechanical heart sounds with LVAD hum present.  LUNGS:  Clear to auscultation bilaterally.  ABDOMEN:  Soft, round, nontender, positive bowel sounds x4.     LVAD exit site: well-healed and incorporated.  Dressing dry and intact.  No erythema or drainage.  Stabilization device present and accurately applied.  Driveline dressing is being changed daily per sterile technique. EXTREMITIES:  Warm and dry, no cyanosis, clubbing, rash or edema  NEUROLOGIC:  Alert and oriented x 4.  Gait steady.  No aphasia.  No dysarthria.  Affect pleasant.       ASSESSMENT AND PLAN:   INR goal 2.0-3.0

## 2012-02-22 NOTE — Assessment & Plan Note (Signed)
Off amio due to increased LFTs.

## 2012-02-25 ENCOUNTER — Other Ambulatory Visit: Payer: Self-pay | Admitting: Internal Medicine

## 2012-02-25 ENCOUNTER — Encounter: Payer: Self-pay | Admitting: *Deleted

## 2012-02-25 ENCOUNTER — Other Ambulatory Visit (INDEPENDENT_AMBULATORY_CARE_PROVIDER_SITE_OTHER): Payer: Medicare Other

## 2012-02-25 DIAGNOSIS — I5022 Chronic systolic (congestive) heart failure: Secondary | ICD-10-CM

## 2012-02-26 ENCOUNTER — Observation Stay (HOSPITAL_COMMUNITY): Payer: Medicare Other

## 2012-02-26 ENCOUNTER — Ambulatory Visit (HOSPITAL_BASED_OUTPATIENT_CLINIC_OR_DEPARTMENT_OTHER)
Admission: RE | Admit: 2012-02-26 | Discharge: 2012-02-26 | Disposition: A | Discharge status: Home / Self Care | Payer: Medicare Other | Source: Ambulatory Visit | Attending: Internal Medicine | Admitting: Internal Medicine

## 2012-02-26 ENCOUNTER — Encounter (HOSPITAL_COMMUNITY): Payer: Self-pay | Admitting: General Practice

## 2012-02-26 ENCOUNTER — Inpatient Hospital Stay (HOSPITAL_COMMUNITY)
Admission: AD | Admit: 2012-02-26 | Discharge: 2012-02-28 | DRG: 389 | Disposition: A | Discharge status: Home / Self Care | Payer: Medicare Other | Source: Ambulatory Visit | Attending: Internal Medicine | Admitting: Internal Medicine

## 2012-02-26 ENCOUNTER — Other Ambulatory Visit (HOSPITAL_COMMUNITY): Payer: Self-pay | Admitting: Anesthesiology

## 2012-02-26 DIAGNOSIS — Z87891 Personal history of nicotine dependence: Secondary | ICD-10-CM

## 2012-02-26 DIAGNOSIS — Z95818 Presence of other cardiac implants and grafts: Secondary | ICD-10-CM | POA: Insufficient documentation

## 2012-02-26 DIAGNOSIS — K561 Intussusception: Principal | ICD-10-CM | POA: Diagnosis present

## 2012-02-26 DIAGNOSIS — Z7982 Long term (current) use of aspirin: Secondary | ICD-10-CM

## 2012-02-26 DIAGNOSIS — E7800 Pure hypercholesterolemia, unspecified: Secondary | ICD-10-CM

## 2012-02-26 DIAGNOSIS — I5022 Chronic systolic (congestive) heart failure: Secondary | ICD-10-CM | POA: Diagnosis present

## 2012-02-26 DIAGNOSIS — I059 Rheumatic mitral valve disease, unspecified: Secondary | ICD-10-CM | POA: Diagnosis present

## 2012-02-26 DIAGNOSIS — K219 Gastro-esophageal reflux disease without esophagitis: Secondary | ICD-10-CM | POA: Diagnosis present

## 2012-02-26 DIAGNOSIS — R112 Nausea with vomiting, unspecified: Secondary | ICD-10-CM

## 2012-02-26 DIAGNOSIS — Z8739 Personal history of other diseases of the musculoskeletal system and connective tissue: Secondary | ICD-10-CM

## 2012-02-26 DIAGNOSIS — I428 Other cardiomyopathies: Secondary | ICD-10-CM | POA: Diagnosis present

## 2012-02-26 DIAGNOSIS — G4730 Sleep apnea, unspecified: Secondary | ICD-10-CM | POA: Diagnosis present

## 2012-02-26 DIAGNOSIS — Z95811 Presence of heart assist device: Secondary | ICD-10-CM

## 2012-02-26 DIAGNOSIS — Z79899 Other long term (current) drug therapy: Secondary | ICD-10-CM

## 2012-02-26 DIAGNOSIS — R0609 Other forms of dyspnea: Secondary | ICD-10-CM

## 2012-02-26 DIAGNOSIS — I5189 Other ill-defined heart diseases: Secondary | ICD-10-CM | POA: Diagnosis present

## 2012-02-26 DIAGNOSIS — R0600 Dyspnea, unspecified: Secondary | ICD-10-CM

## 2012-02-26 DIAGNOSIS — F319 Bipolar disorder, unspecified: Secondary | ICD-10-CM | POA: Diagnosis present

## 2012-02-26 DIAGNOSIS — R0989 Other specified symptoms and signs involving the circulatory and respiratory systems: Secondary | ICD-10-CM

## 2012-02-26 DIAGNOSIS — I129 Hypertensive chronic kidney disease with stage 1 through stage 4 chronic kidney disease, or unspecified chronic kidney disease: Secondary | ICD-10-CM | POA: Diagnosis present

## 2012-02-26 DIAGNOSIS — Z7901 Long term (current) use of anticoagulants: Secondary | ICD-10-CM

## 2012-02-26 DIAGNOSIS — I509 Heart failure, unspecified: Secondary | ICD-10-CM | POA: Diagnosis present

## 2012-02-26 DIAGNOSIS — N189 Chronic kidney disease, unspecified: Secondary | ICD-10-CM | POA: Diagnosis present

## 2012-02-26 HISTORY — DX: Personal history of other diseases of the musculoskeletal system and connective tissue: Z87.39

## 2012-02-26 HISTORY — DX: Other forms of dyspnea: R06.09

## 2012-02-26 HISTORY — DX: Personal history of other medical treatment: Z92.89

## 2012-02-26 HISTORY — DX: Pure hypercholesterolemia, unspecified: E78.00

## 2012-02-26 HISTORY — DX: Dyspnea, unspecified: R06.00

## 2012-02-26 LAB — COMPREHENSIVE METABOLIC PANEL
AST: 213 U/L — ABNORMAL HIGH (ref 0–37)
Alkaline Phosphatase: 139 U/L — ABNORMAL HIGH (ref 39–117)
BUN: 11 mg/dL (ref 6–23)
CO2: 25 mEq/L (ref 19–32)
Chloride: 102 mEq/L (ref 96–112)
Creatinine, Ser: 1.2 mg/dL (ref 0.50–1.35)
GFR calc non Af Amer: 69 mL/min — ABNORMAL LOW (ref >90)
Potassium: 3.7 mEq/L (ref 3.5–5.1)
Total Bilirubin: 0.8 mg/dL (ref 0.3–1.2)

## 2012-02-26 LAB — URINALYSIS, ROUTINE W REFLEX MICROSCOPIC
Ketones, ur: 15 mg/dL — AB
Nitrite: NEGATIVE
Urobilinogen, UA: 2 mg/dL — ABNORMAL HIGH (ref 0.0–1.0)
pH: 5.5 (ref 5.0–8.0)

## 2012-02-26 LAB — CBC
HCT: 36.9 % — ABNORMAL LOW (ref 39.0–52.0)
MCV: 78.8 fL (ref 78.0–100.0)
RBC: 4.68 MIL/uL (ref 4.22–5.81)
WBC: 4.9 10*3/uL (ref 4.0–10.5)

## 2012-02-26 LAB — PROTIME-INR
INR: 1.68 — ABNORMAL HIGH (ref 0.00–1.49)
Prothrombin Time: 20.1 seconds — ABNORMAL HIGH (ref 11.6–15.2)

## 2012-02-26 LAB — URINE MICROSCOPIC-ADD ON

## 2012-02-26 LAB — LACTATE DEHYDROGENASE: LDH: 578 U/L — ABNORMAL HIGH (ref 94–250)

## 2012-02-26 MED ORDER — GABAPENTIN 300 MG PO CAPS
300.0000 mg | ORAL_CAPSULE | Freq: Three times a day (TID) | ORAL | Status: DC
  Administered 2012-02-26 – 2012-02-28 (×6): 300 mg via ORAL
  Filled 2012-02-26 (×8): qty 1

## 2012-02-26 MED ORDER — WARFARIN - PHARMACIST DOSING INPATIENT
Freq: Every day | Status: DC
  Administered 2012-02-27: 18:00:00

## 2012-02-26 MED ORDER — HYDRALAZINE HCL 50 MG PO TABS
75.0000 mg | ORAL_TABLET | Freq: Three times a day (TID) | ORAL | Status: DC
  Administered 2012-02-26 – 2012-02-28 (×7): 75 mg via ORAL
  Filled 2012-02-26 (×9): qty 1

## 2012-02-26 MED ORDER — COUMADIN BOOK
Freq: Once | Status: AC
  Administered 2012-02-26: 18:00:00
  Filled 2012-02-26 (×2): qty 1

## 2012-02-26 MED ORDER — IOHEXOL 300 MG/ML  SOLN
20.0000 mL | INTRAMUSCULAR | Status: AC
  Administered 2012-02-26 (×2): 20 mL via ORAL

## 2012-02-26 MED ORDER — HEPARIN BOLUS VIA INFUSION
2000.0000 [IU] | Freq: Once | INTRAVENOUS | Status: AC
  Administered 2012-02-26: 2000 [IU] via INTRAVENOUS
  Filled 2012-02-26: qty 2000

## 2012-02-26 MED ORDER — HEPARIN (PORCINE) IN NACL 100-0.45 UNIT/ML-% IJ SOLN
1150.0000 [IU]/h | INTRAMUSCULAR | Status: DC
  Administered 2012-02-26: 900 [IU]/h via INTRAVENOUS
  Filled 2012-02-26 (×2): qty 250

## 2012-02-26 MED ORDER — IOHEXOL 300 MG/ML  SOLN
80.0000 mL | Freq: Once | INTRAMUSCULAR | Status: AC | PRN
  Administered 2012-02-26: 80 mL via INTRAVENOUS

## 2012-02-26 MED ORDER — LISINOPRIL 2.5 MG PO TABS
2.5000 mg | ORAL_TABLET | Freq: Every day | ORAL | Status: DC
  Administered 2012-02-26 – 2012-02-28 (×3): 2.5 mg via ORAL
  Filled 2012-02-26 (×3): qty 1

## 2012-02-26 MED ORDER — ASPIRIN 81 MG PO CHEW
81.0000 mg | CHEWABLE_TABLET | Freq: Every day | ORAL | Status: DC
  Administered 2012-02-26 – 2012-02-28 (×3): 81 mg via ORAL
  Filled 2012-02-26 (×3): qty 1

## 2012-02-26 MED ORDER — MOXIFLOXACIN HCL 400 MG PO TABS
400.0000 mg | ORAL_TABLET | Freq: Every day | ORAL | Status: DC
  Administered 2012-02-26 – 2012-02-27 (×2): 400 mg via ORAL
  Filled 2012-02-26 (×3): qty 1

## 2012-02-26 MED ORDER — FERROUS GLUCONATE 324 (38 FE) MG PO TABS
324.0000 mg | ORAL_TABLET | Freq: Three times a day (TID) | ORAL | Status: DC
  Administered 2012-02-26 – 2012-02-28 (×6): 324 mg via ORAL
  Filled 2012-02-26 (×8): qty 1

## 2012-02-26 MED ORDER — DEXTROSE-NACL 5-0.45 % IV SOLN
INTRAVENOUS | Status: AC
  Administered 2012-02-26: 17:00:00 via INTRAVENOUS

## 2012-02-26 MED ORDER — PANTOPRAZOLE SODIUM 40 MG PO TBEC
40.0000 mg | DELAYED_RELEASE_TABLET | Freq: Every day | ORAL | Status: DC
  Administered 2012-02-26 – 2012-02-27 (×2): 40 mg via ORAL
  Filled 2012-02-26: qty 1

## 2012-02-26 MED ORDER — WARFARIN SODIUM 5 MG PO TABS
5.0000 mg | ORAL_TABLET | Freq: Once | ORAL | Status: AC
  Administered 2012-02-26: 5 mg via ORAL
  Filled 2012-02-26: qty 1

## 2012-02-26 MED ORDER — MAGNESIUM OXIDE 400 (241.3 MG) MG PO TABS
800.0000 mg | ORAL_TABLET | Freq: Every day | ORAL | Status: DC
  Administered 2012-02-26 – 2012-02-28 (×3): 800 mg via ORAL
  Filled 2012-02-26 (×3): qty 2

## 2012-02-26 MED ORDER — CARVEDILOL 25 MG PO TABS
25.0000 mg | ORAL_TABLET | Freq: Two times a day (BID) | ORAL | Status: DC
  Administered 2012-02-26 – 2012-02-28 (×4): 25 mg via ORAL
  Filled 2012-02-26 (×6): qty 1

## 2012-02-26 NOTE — Assessment & Plan Note (Signed)
Device interrogated as above.

## 2012-02-26 NOTE — H&P (Signed)
Advanced Heart Failure Team Consult Note   Primary Physician: Dr. Oliver Barre Primary Cardiologist: Dr. Gala Romney  Reason for Admission:  N/V  HPI:    [name] is 51 y/o [place] male with a history of severe CHF due to NICM EF 10-15%, and severe MR previously on home milrinone. He had a HM II LVAD placed at [facility] March 2013. His medical history also consists of CRI, NSVT s/p ICD and LV thrombus.  Recently seen at [facility] and LFTs were mildly elevated, CR, up slightly along with LDH, and INR low. Diuretics were held, Amio stopped and placed on lovenox. Followed up with [facility] Failure [facility] last week and LFTs and Cr trending down.  Presented to Heart Failure Clinic today with persistent N/V for 3 days and can't keep any food or fluids down. He has complained of not feeling himself for the past 2-3 weeks, along with having a persistent cough and nasal congestion. Reports clear/white sputum. Denies abdominal pain, diarrhea, fevers or chills. + dark urine and fatigue.  Review of Systems: [y] = yes, [ ]  = no   General: Weight gain [ ] ; Weight loss [ ] ; Anorexia [ ] ; Fatigue [Y ]; Fever [ ] ; Chills [ ] ; Weakness [ ]   Cardiac: Chest pain/pressure [ ] ; Resting SOB Klaus.Mock ]; Exertional SOB Klaus.Mock ]; Orthopnea [ ] ; Pedal Edema [ ] ; Palpitations [ ] ; Syncope [ ] ; Presyncope [ ] ; Paroxysmal nocturnal dyspnea[ ]   Pulmonary: Cough [Y ]; Wheezing[ ] ; Hemoptysis[ ] ; Sputum [ ] ; Snoring [ ]   GI: Vomiting[Y ]; Dysphagia[ ] ; Melena[ ] ; Hematochezia [ ] ; Heartburn[ ] ; Abdominal pain Klaus.Mock ]; Constipation [ ] ; Diarrhea Klaus.Mock ]; BRBPR Klaus.Mock ]  GU: Hematuria[ ] ; Dysuria [ ] ; Nocturia[ ]   Vascular: Pain in legs with walking [ ] ; Pain in feet with lying flat [ ] ; Non-healing sores [ ] ; Stroke [ ] ; TIA [ ] ; Slurred speech [ ] ;  Neuro: Headaches[ ] ; Vertigo[ ] ; Seizures[ ] ; Paresthesias[ ] ;Blurred vision [ ] ; Diplopia [ ] ; Vision changes [ ]   Ortho/Skin: Arthritis [ ] ; Joint pain [ ] ; Muscle pain [ ] ; Joint swelling [ ] ; Back Pain [ ] ;  Rash [ ]   Psych: Depression[ ] ; Anxiety[ ]   Heme: Bleeding problems [ ] ; Clotting disorders [ ] ; Anemia [ ]   Endocrine: Diabetes [ ] ; Thyroid dysfunction[ ]   Home Medications Prior to Admission medications   Medication Sig Start Date End Date Taking? Authorizing Provider  allopurinol (ZYLOPRIM) 100 MG tablet Take 100 mg by mouth as needed. For gout    Historical Provider, MD  aspirin EC 81 MG tablet Take 81 mg by mouth daily.    Historical Provider, MD  butalbital-acetaminophen-caffeine (FIORICET, ESGIC) 50-325-40 MG per tablet Take 1 tablet by mouth daily as needed. For headache With limit of 10 per month  10/22/11 10/21/12  Corwin Levins, MD  carvedilol (COREG) 25 MG tablet Take 1 tablet (25 mg total) by mouth 2 (two) times daily with a meal. 01/14/12   Dolores Patty, MD  enoxaparin (LOVENOX) 80 MG/0.8ML injection Inject 80 mg into the skin every 12 (twelve) hours.    Historical Provider, MD  ferrous gluconate (FERGON) 324 MG tablet Take 324 mg by mouth 3 (three) times daily with meals.    Historical Provider, MD  gabapentin (NEURONTIN) 300 MG capsule Take 1 capsule (300 mg total) by mouth 3 (three) times daily. 01/14/12   Dolores Patty, MD  hydrALAZINE (APRESOLINE) 50 MG tablet Take 1.5 tablets (75 mg total)  by mouth 3 (three) times daily. 02/21/12   [name], NP  lisinopril (PRINIVIL,ZESTRIL) 2.5 MG tablet Take 2.5 mg by mouth daily.    Historical Provider, MD  Magnesium Oxide (MAG-OX 400 PO) Take 800 mg by mouth 2 (two) times daily.     Historical Provider, MD  ranitidine (ZANTAC) 150 MG tablet Take 150 mg by mouth 2 (two) times daily.    Historical Provider, MD  warfarin (COUMADIN) 4 MG tablet Take 4 mg by mouth daily.    Historical Provider, MD    Past Medical History: Past Medical History  Diagnosis Date  . CHF (congestive heart failure)     EF- 10-15  . Medically noncompliant   . Mitral regurgitation   . Tobacco user   . HTN (hypertension)   . AICD (automatic  cardioverter/defibrillator) present   . GERD (gastroesophageal reflux disease)   . Substance abuse   . Chronic renal insufficiency   . Syncope   . Thrombus 08/06/2010  . SYSTOLIC HEART FAILURE, CHRONIC 09/22/2008    Qualifier: Diagnosis of  By: Gala Romney, MD, Trixie Dredge   . LV (left ventricular) mural thrombus 01/28/2011  . ICD - IN SITU 09/16/2008    Qualifier: Diagnosis of  By: Wonda Amis    . MITRAL STENOSIS/ INSUFFICIENCY, NON-RHEUMATIC 09/22/2008    Qualifier: Diagnosis of  By: Gala Romney, MD, Trixie Dredge Hepatomegaly 09/16/2008    Qualifier: Diagnosis of  By: Wonda Amis    . High cholesterol 02/26/2012    "at one time"  . Sleep apnea   . Exertional dyspnea 02/26/2012  . History of blood transfusion 08/2011    "when I had heart pump"  . Migraines   . COMMON MIGRAINE 06/14/2009    Qualifier: Diagnosis of  By: Jonny Ruiz MD, Len Blalock   . History of gout 02/26/2012  . Depression   . Bipolar affective disorder 10/22/2011    pt denies this hx 02/26/2012    Past Surgical History: Past Surgical History  Procedure Date  . Cardiac defibrillator placement ~ 2008  . Left ventricular assist device 08/2011    Family History: Family History  Problem Relation Age of Onset  . Coronary artery disease Neg Hx     Social History: History   Social History  . Marital Status: Divorced    Spouse Name: N/A    Number of Children: N/A  . Years of Education: N/A   Social History Main Topics  . Smoking status: Former Smoker -- 0.2 packs/day for 33 years    Types: Cigarettes    Quit date: 06/29/2011  . Smokeless tobacco: Former Neurosurgeon    Quit date: 06/29/2011  . Alcohol Use: No  . Drug Use: No     denies  . Sexually Active: Not Currently   Other Topics Concern  . None   Social History Narrative  . None    Allergies:  Allergies  Allergen Reactions  . Lexapro (Escitalopram Oxalate) Other (See Comments)    somnolence    Objective:    Vital Signs:   BP:  106/1    Weight:  162 lb    LVAD Interrogation: HeartMate II LVAD: Flow 4.7 liters/min, speed 9200, power 5.8 , PI 6.0. Several brief power surges/PI events  I reviewed the LVAD parameters from today, and compared the results to the patient's prior recorded data. No programming changes were made. The LVAD is functioning within specified parameters. The patient performs LVAD self-test daily.LVAD equipment  check completed and is in good working order. Back-up equipment present. LVAD education done on emergency procedures and precautions and reviewed exit site care.    Physical Exam: GENERAL: Fatigued appearing but not toxic.  HEENT: normal . Oropharynx clear.  NECK: Supple, JVP 5 . 2+ bilaterally, no bruits. No lymphadenopathy or thyromegaly appreciated.  CARDIAC: Mechanical heart sounds with LVAD hum present.  LUNGS: Clear to auscultation bilaterally.  ABDOMEN: Soft, round, nontender, positive bowel sounds x4.  LVAD exit site: well-healed and incorporated. Dressing dry and intact. No erythema or drainage. Stabilization device present and accurately applied. Driveline dressing is being changed daily per sterile technique.  EXTREMITIES: Warm and dry, no cyanosis, clubbing, rash or edema  NEUROLOGIC: Alert and oriented x 4. Gait steady. No aphasia. No dysarthria. Affect pleasant.    Labs: Basic Metabolic Panel:  Lab 02/26/12 1610 02/21/12 1122  NA 142 136  K 3.7 4.2  CL 102 99  CO2 25 24  GLUCOSE 97 111*  BUN 11 11  CREATININE 1.20 1.21  CALCIUM 9.6 9.6  MG -- --  PHOS -- --    Liver Function Tests:  Lab 02/26/12 1149 02/21/12 1122  AST 213* 135*  ALT 262* 185*  ALKPHOS 139* 138*  BILITOT 0.8 0.4  PROT 8.4* 8.4*  ALBUMIN 3.7 3.7    Lab 02/26/12 1149  LIPASE 27  AMYLASE 47   No results found for this basename: AMMONIA:3 in the last 168 hours  CBC:  Lab 02/26/12 1149 02/21/12 1122  WBC 4.9 5.0  NEUTROABS -- --  HGB 12.2* 12.9*  HCT 36.9* 38.6*  MCV 78.8 79.1  PLT 347  292    BNP: BNP (last 3 results)  Basename 01/14/12 1056 09/08/11 2235 07/06/11 1743  PROBNP 127.0* 5981.0* 5088.0*   Coagulation Studies:  Basename 02/26/12 1550 22-Mar-2012 1106  LABPROT 20.1* 27.3*  INR 1.68* 2.5*    Imaging: No results found.   Assessment:   1) Nausea/Vomiting 2) Chronic Systolic HF            -EF 10-15% 3) NICM          - s/p LVAD 08/2011 4) Transaminitis  Plan/Discussion:    I suspect he has a viral syndrome but it has been going on longer than I would suspect. I do not see any clear indication of device infection or other pathology. Given poor po intake with bring in for 23 hour observation. Will check CMET, CBC, UA, UA/UCx. BCX, Hepatitis panels, amylase/lipase, HIV and CT of the abdomen. Hydrate gently. Treat with moxifloxacin for cough and sinus fullness.   Continue lovenox as needed if INR still < 2.0. May need ramp study to make sure LV is being unloaded. LVAD interrogated as above.   D/w Dr. Jose Persia at [facility] VAD clinic.  [name] 02/26/2012, 5:58 PM

## 2012-02-26 NOTE — Assessment & Plan Note (Signed)
He is probably dry. LDH has been mildly elevated in setting of low INR. Will hydrate and recheck INR. Continue lovenox as needed if INR still < 2.0. May need ramp study to make sure LV is being unloaded.

## 2012-02-26 NOTE — Assessment & Plan Note (Addendum)
I suspect he has a viral syndrome but it has been going on longer than I would suspect. I do not see any clear indication of device infection or other pathology. Given poor po intake with bring in for 23 hour observation. Will check CMET, CBC, UA, Hepatitis panels, amylase/lipase, HIV and CT of the abdomen. Hydrate gently. Treat with moxifloxacin for cough and sinus fullness.

## 2012-02-26 NOTE — Progress Notes (Signed)
HPI: [name] is 51 y/o [place] male with a history of severe CHF due to NICM EF 10-15%, and severe MR previously on home milrinone. He had a HM II LVAD placed at [facility] March 2013. His medical history also consists of CRI, NSVT s/p ICD and LV thrombus.   Recently seen at [facility] and LFTs were mildly elevated and CR up slightly. Also INR low. Diuretics held and place on lovenox.  Over past 2 weeks not feeling well. Reports frequent cough and some nasal congestion. Whit sputum. Also having trouble holding any food down Denies ab pain, diarrhea, fevers or chills. Urine dark yellow. + fatigue  Denies LVAD alarms.  Denies driveline trauma, erythema or drainage.  Denies ICD shocks.     Past Medical History  Diagnosis Date  . CHF (congestive heart failure)     EF- 10-15  . Medically noncompliant   . Mitral regurgitation   . Tobacco user   . HTN (hypertension)   . Depression   . AICD (automatic cardioverter/defibrillator) present   . GERD (gastroesophageal reflux disease)   . Substance abuse   . Migraines   . Chronic renal insufficiency   . Syncope   . Sleep apnea   . Thrombus 08/06/2010  . SYSTOLIC HEART FAILURE, CHRONIC 09/22/2008    Qualifier: Diagnosis of  By: Gala Romney, MD, Trixie Dredge   . LV (left ventricular) mural thrombus 01/28/2011  . ICD - IN SITU 09/16/2008    Qualifier: Diagnosis of  By: Wonda Amis    . MITRAL STENOSIS/ INSUFFICIENCY, NON-RHEUMATIC 09/22/2008    Qualifier: Diagnosis of  By: Gala Romney, MD, Trixie Dredge Hepatomegaly 09/16/2008    Qualifier: Diagnosis of  By: Wonda Amis    . COMMON MIGRAINE 06/14/2009    Qualifier: Diagnosis of  By: Jonny Ruiz MD, Len Blalock   . Bipolar affective disorder 10/22/2011    Current Outpatient Prescriptions  Medication Sig Dispense Refill  . allopurinol (ZYLOPRIM) 100 MG tablet Take 100 mg by mouth as needed. For gout      . aspirin EC 81 MG tablet Take 81 mg by mouth daily.      .  butalbital-acetaminophen-caffeine (FIORICET, ESGIC) 50-325-40 MG per tablet Take 1 tablet by mouth daily as needed. For headache With limit of 10 per month       . carvedilol (COREG) 25 MG tablet Take 1 tablet (25 mg total) by mouth 2 (two) times daily with a meal.  60 tablet  6  . enoxaparin (LOVENOX) 80 MG/0.8ML injection Inject 80 mg into the skin every 12 (twelve) hours.      . ferrous gluconate (FERGON) 324 MG tablet Take 324 mg by mouth 3 (three) times daily with meals.      . gabapentin (NEURONTIN) 300 MG capsule Take 1 capsule (300 mg total) by mouth 3 (three) times daily.  90 capsule  6  . hydrALAZINE (APRESOLINE) 50 MG tablet Take 1.5 tablets (75 mg total) by mouth 3 (three) times daily.  90 tablet  6  . lisinopril (PRINIVIL,ZESTRIL) 2.5 MG tablet Take 2.5 mg by mouth daily.      . Magnesium Oxide (MAG-OX 400 PO) Take 800 mg by mouth 2 (two) times daily.       . ranitidine (ZANTAC) 150 MG tablet Take 150 mg by mouth 2 (two) times daily.      Marland Kitchen warfarin (COUMADIN) 4 MG tablet Take 4 mg by mouth daily.      Marland Kitchen DISCONTD:  isosorbide mononitrate (IMDUR) 30 MG 24 hr tablet Take 1 tablet (30 mg total) by mouth daily.  60 tablet  6    Lexapro  REVIEW OF SYSTEMS: All systems negative except as listed in HPI, PMH and Problem list.   LVAD INTERROGATION:   HeartMate II LVAD:  Flow 4.7 liters/min, speed 9200, power 5.8 , PI 6.0.   Several brief power surges/PI events I reviewed the LVAD parameters from today, and compared the results to the patient's prior recorded data.  No programming changes were made.  The LVAD is functioning within specified parameters.  The patient performs LVAD self-test daily.  LVAD interrogation was negative for any significant power changes or alarms. Multiple PI events, probably due to MAP being increased.LVAD equipment check completed and is in good working order.  Back-up equipment present.   LVAD education done on emergency procedures and precautions and reviewed exit  site care.   Physical Exam: There were no vitals filed for this visit.  GENERAL: Fatigued appearing but not toxic. HEENT: normal . Oropharynx clear. NECK: Supple, JVP 5  .  2+ bilaterally, no bruits.  No lymphadenopathy or thyromegaly appreciated.   CARDIAC:  Mechanical heart sounds with LVAD hum present.  LUNGS:  Clear to auscultation bilaterally.  ABDOMEN:  Soft, round, nontender, positive bowel sounds x4.     LVAD exit site: well-healed and incorporated.  Dressing dry and intact.  No erythema or drainage.  Stabilization device present and accurately applied.  Driveline dressing is being changed daily per sterile technique. EXTREMITIES:  Warm and dry, no cyanosis, clubbing, rash or edema  NEUROLOGIC:  Alert and oriented x 4.  Gait steady.  No aphasia.  No dysarthria.  Affect pleasant.       ASSESSMENT AND PLAN:

## 2012-02-26 NOTE — Consult Note (Signed)
ANTICOAGULATION CONSULT NOTE - Initial Consult  Pharmacy Consult for Heparin + Coumadin Indication: LVAD, suspected pump thrombosis  Allergies  Allergen Reactions  . Lexapro (Escitalopram Oxalate) Other (See Comments)    somnolence    Patient Measurements: Height: 5' 4.96" (165 cm) Weight: 160 lb (72.576 kg) IBW/kg (Calculated) : 61.41  Heparin Dosing Weight: 72.5kg  Vital Signs:    Labs:  Basename 02/26/12 1149 02/25/12 1106  HGB 12.2* --  HCT 36.9* --  PLT 347 --  APTT -- --  LABPROT -- 27.3*  INR -- 2.5*  HEPARINUNFRC -- --  CREATININE 1.20 --  CKTOTAL -- --  CKMB -- --  TROPONINI -- --    Estimated Creatinine Clearance: 64.7 ml/min (by C-G formula based on Cr of 1.2).   Medical History: Past Medical History  Diagnosis Date  . CHF (congestive heart failure)     EF- 10-15  . Medically noncompliant   . Mitral regurgitation   . Tobacco user   . HTN (hypertension)   . AICD (automatic cardioverter/defibrillator) present   . GERD (gastroesophageal reflux disease)   . Substance abuse   . Chronic renal insufficiency   . Syncope   . Thrombus 08/06/2010  . SYSTOLIC HEART FAILURE, CHRONIC 09/22/2008    Qualifier: Diagnosis of  By: Gala Romney, MD, Trixie Dredge   . LV (left ventricular) mural thrombus 01/28/2011  . ICD - IN SITU 09/16/2008    Qualifier: Diagnosis of  By: Wonda Amis    . MITRAL STENOSIS/ INSUFFICIENCY, NON-RHEUMATIC 09/22/2008    Qualifier: Diagnosis of  By: Gala Romney, MD, Trixie Dredge Hepatomegaly 09/16/2008    Qualifier: Diagnosis of  By: Wonda Amis    . High cholesterol 02/26/2012    "at one time"  . Sleep apnea   . Exertional dyspnea 02/26/2012  . History of blood transfusion 08/2011    "when I had heart pump"  . Migraines   . COMMON MIGRAINE 06/14/2009    Qualifier: Diagnosis of  By: Jonny Ruiz MD, Len Blalock   . History of gout 02/26/2012  . Depression   . Bipolar affective disorder 10/22/2011    pt denies this hx 02/26/2012    Assessment: 51yom s/p LVAD in March 2013 presents with N/V x 3 days. He was on coumadin pta (3mg  daily) for hx LV thrombus and LVAD. INR yesterday was therapeutic at 2.5. Today's INR is subtherapeutic at 1.68.  Possible that he did not absorb last night's dose since he's been vomiting but also patient says he had a 'little bit of salad' last night. Now with subtherapeutic INR and suspected pump thrombosis, he will begin IV heparin. Will aim for a lower heparin goal given risk of bleeding with LVAD. Baseline CBC wnl.  Goal of Therapy:  Heparin level 0.3-0.5 units/ml INR 2-2.5 Monitor platelets by anticoagulation protocol: Yes   Plan:  1) Heparin bolus 2000 units x 1 (small due to elevated INR and increased bleeding risk) 2) Heparin drip at 900 units/hr (~12 units/kg/hr) 3) 6h heparin level 4) Coumadin 5mg  x 1 5) Coumadin education 6) Daily heparin level, CBC, INR   Fredrik Rigger 02/26/2012,4:07 PM

## 2012-02-26 NOTE — H&P (Deleted)
Advanced Heart Failure Team Consult Note   Primary Physician: Dr. Oliver Barre Primary Cardiologist: Dr. Gala Romney  Reason for Admission:  N/V  HPI:    [name] is 51 y/o [place] American male with a history of severe CHF due to NICM EF 10-15%, and severe MR previously on home milrinone. He had a HM II LVAD placed at [facility] March 2013. His medical history also consists of CRI, NSVT s/p ICD and LV thrombus.  Recently seen at [facility] and LFTs were mildly elevated, CR, up slightly along with LDH, and INR low. Diuretics were held, Amio stopped and placed on lovenox. Followed up with Heart Failure Clinic last week and LFTs and Cr trending down.  Presented to Heart Failure Clinic today with persistent N/V for 3 days and can't keep any food or fluids down. He has complained of not feeling himself for the past 2-3 weeks, along with having a persistent cough and nasal congestion. Reports clear/white sputum. Denies abdominal pain, diarrhea, fevers or chills. + dark urine and fatigue.  Review of Systems: [y] = yes, [ ]  = no   General: Weight gain [ ] ; Weight loss [ ] ; Anorexia [ ] ; Fatigue [Y ]; Fever [ ] ; Chills [ ] ; Weakness [ ]   Cardiac: Chest pain/pressure [ ] ; Resting SOB Klaus.Mock ]; Exertional SOB Klaus.Mock ]; Orthopnea [ ] ; Pedal Edema [ ] ; Palpitations [ ] ; Syncope [ ] ; Presyncope [ ] ; Paroxysmal nocturnal dyspnea[ ]   Pulmonary: Cough [Y ]; Wheezing[ ] ; Hemoptysis[ ] ; Sputum [ ] ; Snoring [ ]   GI: Vomiting[Y ]; Dysphagia[ ] ; Melena[ ] ; Hematochezia [ ] ; Heartburn[ ] ; Abdominal pain Klaus.Mock ]; Constipation [ ] ; Diarrhea Klaus.Mock ]; BRBPR Klaus.Mock ]  GU: Hematuria[ ] ; Dysuria [ ] ; Nocturia[ ]   Vascular: Pain in legs with walking [ ] ; Pain in feet with lying flat [ ] ; Non-healing sores [ ] ; Stroke [ ] ; TIA [ ] ; Slurred speech [ ] ;  Neuro: Headaches[ ] ; Vertigo[ ] ; Seizures[ ] ; Paresthesias[ ] ;Blurred vision [ ] ; Diplopia [ ] ; Vision changes [ ]   Ortho/Skin: Arthritis [ ] ; Joint pain [ ] ; Muscle pain [ ] ; Joint swelling [ ] ; Back Pain [ ] ;  Rash [ ]   Psych: Depression[ ] ; Anxiety[ ]   Heme: Bleeding problems [ ] ; Clotting disorders [ ] ; Anemia [ ]   Endocrine: Diabetes [ ] ; Thyroid dysfunction[ ]   Home Medications Prior to Admission medications   Medication Sig Start Date End Date Taking? Authorizing Provider  allopurinol (ZYLOPRIM) 100 MG tablet Take 100 mg by mouth as needed. For gout    Historical Provider, MD  aspirin EC 81 MG tablet Take 81 mg by mouth daily.    Historical Provider, MD  butalbital-acetaminophen-caffeine (FIORICET, ESGIC) 50-325-40 MG per tablet Take 1 tablet by mouth daily as needed. For headache With limit of 10 per month  10/22/11 10/21/12  Corwin Levins, MD  carvedilol (COREG) 25 MG tablet Take 1 tablet (25 mg total) by mouth 2 (two) times daily with a meal. 01/14/12   Dolores Patty, MD  enoxaparin (LOVENOX) 80 MG/0.8ML injection Inject 80 mg into the skin every 12 (twelve) hours.    Historical Provider, MD  ferrous gluconate (FERGON) 324 MG tablet Take 324 mg by mouth 3 (three) times daily with meals.    Historical Provider, MD  gabapentin (NEURONTIN) 300 MG capsule Take 1 capsule (300 mg total) by mouth 3 (three) times daily. 01/14/12   Dolores Patty, MD  hydrALAZINE (APRESOLINE) 50 MG tablet Take 1.5 tablets (75 mg total)  by mouth 3 (three) times daily. 02/21/12   [name], NP  lisinopril (PRINIVIL,ZESTRIL) 2.5 MG tablet Take 2.5 mg by mouth daily.    Historical Provider, MD  Magnesium Oxide (MAG-OX 400 PO) Take 800 mg by mouth 2 (two) times daily.     Historical Provider, MD  ranitidine (ZANTAC) 150 MG tablet Take 150 mg by mouth 2 (two) times daily.    Historical Provider, MD  warfarin (COUMADIN) 4 MG tablet Take 4 mg by mouth daily.    Historical Provider, MD    Past Medical History: Past Medical History  Diagnosis Date  . CHF (congestive heart failure)     EF- 10-15  . Medically noncompliant   . Mitral regurgitation   . Tobacco user   . HTN (hypertension)   . Depression   . AICD  (automatic cardioverter/defibrillator) present   . GERD (gastroesophageal reflux disease)   . Substance abuse   . Migraines   . Chronic renal insufficiency   . Syncope   . Sleep apnea   . Thrombus 08/06/2010  . SYSTOLIC HEART FAILURE, CHRONIC 09/22/2008    Qualifier: Diagnosis of  By: Gala Romney, MD, Trixie Dredge   . LV (left ventricular) mural thrombus 01/28/2011  . ICD - IN SITU 09/16/2008    Qualifier: Diagnosis of  By: Wonda Amis    . MITRAL STENOSIS/ INSUFFICIENCY, NON-RHEUMATIC 09/22/2008    Qualifier: Diagnosis of  By: Gala Romney, MD, Trixie Dredge Hepatomegaly 09/16/2008    Qualifier: Diagnosis of  By: Wonda Amis    . COMMON MIGRAINE 06/14/2009    Qualifier: Diagnosis of  By: Jonny Ruiz MD, Len Blalock   . Bipolar affective disorder 10/22/2011    Past Surgical History: Past Surgical History  Procedure Date  . Cardiac defibrillator placement     Family History: Family History  Problem Relation Age of Onset  . Coronary artery disease Neg Hx     Social History: History   Social History  . Marital Status: Divorced    Spouse Name: N/A    Number of Children: N/A  . Years of Education: N/A   Social History Main Topics  . Smoking status: Former Smoker -- 0.2 packs/day for 20 years    Types: Cigarettes    Quit date: 06/29/2011  . Smokeless tobacco: Former Neurosurgeon    Quit date: 06/29/2011  . Alcohol Use: No  . Drug Use: No  . Sexually Active: Not on file   Other Topics Concern  . Not on file   Social History Narrative  . No narrative on file    Allergies:  Allergies  Allergen Reactions  . Lexapro (Escitalopram Oxalate) Other (See Comments)    somnolence    Objective:    Vital Signs:   BP:  106/1  Weight:  162 lb    LVAD Interrogation: HeartMate II LVAD: Flow 4.7 liters/min, speed 9200, power 5.8 , PI 6.0. Several brief power surges/PI events  I reviewed the LVAD parameters from today, and compared the results to the patient's prior  recorded data. No programming changes were made. The LVAD is functioning within specified parameters. The patient performs LVAD self-test daily.LVAD equipment check completed and is in good working order. Back-up equipment present. LVAD education done on emergency procedures and precautions and reviewed exit site care.    Physical Exam: GENERAL: Fatigued appearing but not toxic.  HEENT: normal . Oropharynx clear.  NECK: Supple, JVP 5 . 2+ bilaterally, no bruits. No lymphadenopathy  or thyromegaly appreciated.  CARDIAC: Mechanical heart sounds with LVAD hum present.  LUNGS: Clear to auscultation bilaterally.  ABDOMEN: Soft, round, nontender, positive bowel sounds x4.  LVAD exit site: well-healed and incorporated. Dressing dry and intact. No erythema or drainage. Stabilization device present and accurately applied. Driveline dressing is being changed daily per sterile technique.  EXTREMITIES: Warm and dry, no cyanosis, clubbing, rash or edema  NEUROLOGIC: Alert and oriented x 4. Gait steady. No aphasia. No dysarthria. Affect pleasant.    Labs: Basic Metabolic Panel:  Lab 02/26/12 8469 02/21/12 1122  NA 142 136  K 3.7 4.2  CL 102 99  CO2 25 24  GLUCOSE 97 111*  BUN 11 11  CREATININE 1.20 1.21  CALCIUM 9.6 9.6  MG -- --  PHOS -- --    Liver Function Tests:  Lab 02/26/12 1149 02/21/12 1122  AST 213* 135*  ALT 262* 185*  ALKPHOS 139* 138*  BILITOT 0.8 0.4  PROT 8.4* 8.4*  ALBUMIN 3.7 3.7    Lab 02/26/12 1149  LIPASE 27  AMYLASE 47   No results found for this basename: AMMONIA:3 in the last 168 hours  CBC:  Lab 02/26/12 1149 02/21/12 1122  WBC 4.9 5.0  NEUTROABS -- --  HGB 12.2* 12.9*  HCT 36.9* 38.6*  MCV 78.8 79.1  PLT 347 292    BNP: BNP (last 3 results)  Basename 01/14/12 1056 09/08/11 2235 07/06/11 1743  PROBNP 127.0* 5981.0* 5088.0*   Coagulation Studies:  Basename 02/25/12 1106  LABPROT 27.3*  INR 2.5*    Imaging:  No results  found.   Assessment:   1) Nausea/Vomiting 2) Chronic Systolic HF            -EF 10-15% 3) NICM          - s/p LVAD 08/2011  Plan/Discussion:    Will admit patient to 2000 PTCU for 23 hour observation bed, suspect viral but no clear indication. Obtain CMET, CBC, UA, hepatitis panels, amylase/lipase, HIV and CT of abdomen. Give 500 cc of D5 1/2 NS @ 75 hr. Start moxifloxacin. Continue to monitor LDH daily and may get ramp ECHO tomorrow.     Length of Stay:   [name] 02/26/2012, 12:18 PM

## 2012-02-27 ENCOUNTER — Observation Stay (HOSPITAL_COMMUNITY): Payer: Medicare Other

## 2012-02-27 DIAGNOSIS — K56609 Unspecified intestinal obstruction, unspecified as to partial versus complete obstruction: Secondary | ICD-10-CM

## 2012-02-27 LAB — URINALYSIS, ROUTINE W REFLEX MICROSCOPIC
Bilirubin Urine: NEGATIVE
Glucose, UA: NEGATIVE mg/dL
Hgb urine dipstick: NEGATIVE
Specific Gravity, Urine: 1.008 (ref 1.005–1.030)

## 2012-02-27 LAB — HEPATITIS PANEL, ACUTE
HCV Ab: NEGATIVE
Hepatitis B Surface Ag: NEGATIVE

## 2012-02-27 LAB — HEPARIN LEVEL (UNFRACTIONATED): Heparin Unfractionated: 0.41 IU/mL (ref 0.30–0.70)

## 2012-02-27 LAB — CBC
Hemoglobin: 11.4 g/dL — ABNORMAL LOW (ref 13.0–17.0)
MCH: 26 pg (ref 26.0–34.0)
MCV: 79 fL (ref 78.0–100.0)
Platelets: 331 10*3/uL (ref 150–400)
RBC: 4.39 MIL/uL (ref 4.22–5.81)
WBC: 4.8 10*3/uL (ref 4.0–10.5)

## 2012-02-27 LAB — LACTATE DEHYDROGENASE: LDH: 517 U/L — ABNORMAL HIGH (ref 94–250)

## 2012-02-27 LAB — PROTIME-INR: INR: 1.71 — ABNORMAL HIGH (ref 0.00–1.49)

## 2012-02-27 MED ORDER — WARFARIN SODIUM 5 MG PO TABS
5.0000 mg | ORAL_TABLET | Freq: Once | ORAL | Status: AC
  Administered 2012-02-27: 5 mg via ORAL
  Filled 2012-02-27: qty 1

## 2012-02-27 MED ORDER — ACETAMINOPHEN 325 MG PO TABS: 650.0000 mg | ORAL_TABLET | Freq: Four times a day (QID) | ORAL | Status: DC | PRN

## 2012-02-27 MED ORDER — HEPARIN BOLUS VIA INFUSION
2000.0000 [IU] | Freq: Once | INTRAVENOUS | Status: AC
  Administered 2012-02-27: 2000 [IU] via INTRAVENOUS
  Filled 2012-02-27: qty 2000

## 2012-02-27 MED ORDER — DEXTROSE-NACL 5-0.45 % IV SOLN
INTRAVENOUS | Status: AC
  Administered 2012-02-27: 17:00:00 via INTRAVENOUS

## 2012-02-27 MED ORDER — HEPARIN (PORCINE) IN NACL 100-0.45 UNIT/ML-% IJ SOLN
1300.0000 [IU]/h | INTRAMUSCULAR | Status: DC
  Administered 2012-02-27 (×2): 1300 [IU]/h via INTRAVENOUS
  Filled 2012-02-27 (×2): qty 250

## 2012-02-27 NOTE — Progress Notes (Signed)
ANTICOAGULATION CONSULT NOTE - Follow Up Consult  Pharmacy Consult for Heparin Indication: LVAD, suspected pump thrombosis  Allergies  Allergen Reactions  . Lexapro (Escitalopram Oxalate) Other (See Comments)    somnolence   Vital Signs: Temp: 98.4 F (36.9 C) (09/05 1403) Temp src: Oral (09/05 0418) Pulse Rate: 92  (09/05 0418)  Labs:  Basename 02/27/12 1528 02/27/12 0640 02/26/12 2344 02/26/12 1550 02/26/12 1149 02/25/12 1106  HGB -- 11.4* -- -- 12.2* --  HCT -- 34.7* -- -- 36.9* --  PLT -- 331 -- -- 347 --  APTT -- -- -- -- -- --  LABPROT -- 20.4* -- 20.1* -- 27.3*  INR -- 1.71* -- 1.68* -- 2.5*  HEPARINUNFRC 0.41 0.27* 0.12* -- -- --  CREATININE -- -- -- -- 1.20 --  CKTOTAL -- -- -- -- -- --  CKMB -- -- -- -- -- --  TROPONINI -- -- -- -- -- --    Estimated Creatinine Clearance: 62.4 ml/min (by C-G formula based on Cr of 1.2).   Medications:  Heparin @ 1300 units/hr  Assessment: 51yom continues on heparin with a therapeutic heparin level after rate increase this morning. No bleeding noted.  Goal of Therapy:  Heparin level 0.3-0.5 units/ml Monitor platelets by anticoagulation protocol: Yes   Plan:  1) Continue heparin at 1300 units/hr 2) Follow up heparin level in AM  Fredrik Rigger 02/27/2012,4:01 PM

## 2012-02-27 NOTE — Progress Notes (Signed)
HeartMate 2 Rounding Note  Subjective:    [name] is 51 y/o [place] American male with a history of severe CHF due to NICM EF 10-15%, and severe MR previously on home milrinone. He had a HM II LVAD placed at [facility] March 2013. His medical history also consists of CRI, NSVT s/p ICD and LV thrombus.   Recently seen at [facility] and LFTs were mildly elevated, CR, up slightly along with LDH, and INR low. Diuretics were held, Amio stopped and placed on lovenox. Followed up with Heart Failure Clinic last week and LFTs and Cr trending down.   Admitted yesterday from clinic for persistent N/V for 3 days, along with persistent cough and nasal congestion. AST-213 and ALT-262, and + ketones, protein and urobilinogen in urine. INR 1.68, heparin gtt started. CT scan transient proximal jejunal intussusception.  Overnight patient had no complaints and has not had any N/V since yesterday am. Denies abdominal pain/SOB/CP. + cough and fever 101.9.   LVAD INTERROGATION:  HeartMate II LVAD:  Flow --- liters/min, speed 9200, power 6.0, PI 6.1.   Objective:    Vital Signs:   Temp:  [99.1 F (37.3 C)-101.9 F (38.8 C)] 99.1 F (37.3 C) (09/05 0606) Pulse Rate:  [77-92] 92  (09/05 0418) Resp:  [20] 20  (09/04 2100) BP: (86)/(1) 86/1 mmHg (09/04 2100) SpO2:  [95 %-97 %] 95 % (09/05 0418) Weight:  [156 lb 4.8 oz (70.897 kg)-160 lb (72.576 kg)] 156 lb 4.8 oz (70.897 kg) (09/04 2100) Last BM Date: 02/24/12 Mean arterial Pressure 102  Intake/Output:   Intake/Output Summary (Last 24 hours) at 02/27/12 0814 Last data filed at 02/26/12 1700  Gross per 24 hour  Intake    240 ml  Output      0 ml  Net    240 ml     Physical Exam: General:  Well appearing. No resp difficulty HEENT: normal Neck: supple. JVP 5-6. Carotids 2+ bilat; no bruits. No lymphadenopathy or thryomegaly appreciated. Cor: Mechanical heart sounds with LVAD hum present. Lungs: clear to ausculation bilaterally Abdomen: soft, nontender,  nondistended. No hepatosplenomegaly. No bruits or masses. BSx4;  LVAD exit site: dressing C/D/I; stabilization device intact Extremities: no cyanosis, clubbing, rash, edema Neuro: alert & orientedx3, cranial nerves grossly intact. moves all 4 extremities w/o difficulty. Affect pleasant  Telemetry: NSR 85 bbp  Labs: Basic Metabolic Panel:  Lab 02/26/12 1610 02/21/12 1122  NA 142 136  K 3.7 4.2  CL 102 99  CO2 25 24  GLUCOSE 97 111*  BUN 11 11  CREATININE 1.20 1.21  CALCIUM 9.6 9.6  MG -- --  PHOS -- --    Liver Function Tests:  Lab 02/26/12 1149 02/21/12 1122  AST 213* 135*  ALT 262* 185*  ALKPHOS 139* 138*  BILITOT 0.8 0.4  PROT 8.4* 8.4*  ALBUMIN 3.7 3.7    Lab 02/26/12 1149  LIPASE 27  AMYLASE 47   No results found for this basename: AMMONIA:3 in the last 168 hours  CBC:  Lab 02/27/12 0640 02/26/12 1149 02/21/12 1122  WBC 4.8 4.9 5.0  NEUTROABS -- -- --  HGB 11.4* 12.2* 12.9*  HCT 34.7* 36.9* 38.6*  MCV 79.0 78.8 79.1  PLT 331 347 292    INR:  Lab 02/27/12 0640 02/26/12 1550 02/25/12 1106 02/21/12 1305  INR 1.71* 1.68* 2.5* 1.57*    Other results:  EKG:   Imaging: Ct Abdomen Pelvis W Contrast  02/26/2012  *RADIOLOGY REPORT*  Clinical Data: Upper abdominal pain  with nausea and vomiting. History of heart surgery and cardiac assist device.  CT ABDOMEN AND PELVIS WITH CONTRAST  Technique:  Multidetector CT imaging of the abdomen and pelvis was performed following the standard protocol during bolus administration of intravenous contrast.  Contrast: 80mL OMNIPAQUE IOHEXOL 300 MG/ML  SOLN, 1 OMNIPAQUE IOHEXOL 300 MG/ML  SOLN  Comparison: Chest radiographs 11/27/2011, acute abdominal series 10/17/2006.  Findings: AICD appears unchanged at the right ventricular apex. The patient has a left ventricular assist device which is incompletely imaged.  There is no evidence of paracardiac or anterior mediastinal hemorrhage.  There is no pleural or pericardial effusion.   Patchy opacities are present at both lung bases.  The liver, gallbladder, biliary system and pancreas appear normal. The spleen and adrenal glands appear normal.  Both kidneys appear normal.  There is no hydronephrosis.  The bowel gas pattern is normal.  There are no inflammatory changes.  There is a probable incidental intussusception involving the proximal jejunum on image 48.  The appendix appears normal. There are no significant vascular abnormalities.  The prostate gland and bladder appear normal.  There is mildly asymmetric fat within the right inguinal canal.  There is lower lumbar spine degenerative disc disease, especially at L4-L5.  No acute osseous findings are seen.  IMPRESSION:  1.  No acute abdominal pelvic findings. 2.  Probable incidental transient proximal jejunal intussusception. No evidence of bowel obstruction or surrounding inflammation. 3.  Left ventricular assist device without demonstrated complication.   Original Report Authenticated By: Gerrianne Scale, M.D.      Medications:     Scheduled Medications:    . aspirin  81 mg Oral Daily  . carvedilol  25 mg Oral BID WC  . coumadin book   Does not apply Once  . ferrous gluconate  324 mg Oral TID WC  . gabapentin  300 mg Oral TID  . heparin  2,000 Units Intravenous Once  . heparin  2,000 Units Intravenous Once  . hydrALAZINE  75 mg Oral Q8H  . iohexol  20 mL Oral Q1 Hr x 2  . lisinopril  2.5 mg Oral Daily  . magnesium oxide  800 mg Oral Daily  . moxifloxacin  400 mg Oral q1800  . pantoprazole  40 mg Oral Q1200  . warfarin  5 mg Oral ONCE-1800  . Warfarin - Pharmacist Dosing Inpatient   Does not apply q1800    Infusions:    . dextrose 5 % and 0.45% NaCl 75 mL/hr at 02/26/12 1710  . heparin 1,150 Units/hr (02/27/12 0116)    PRN Medications: acetaminophen, iohexol   Assessment:   1) Nausea/Vomiting  2) Chronic Systolic HF              -EF 10-15%  3) NICM              - s/p LVAD 08/2011  4)  Transaminitis    Plan/Discussion:    Febrile overnight, feels ok this morning.  Awaiting labs this am. Will continue to monitor AST/ALT and LDH. Continue heparin until INR therapeutic and LDH starts trending down. Awaiting blood cultures and urine culture. Encourage ambulation in the hall. Will call [facility] with update on patient.  Abdominal exam today is benign.  Nausea improved, no vomiting.  He ate a light breakfast and had 1 loose stool this morning (has not had loose stool prior to today).  CT of the abdominal showed a probable incidental jejunal intussusception without evidence for obstruction.  Will  get CXR in addition.  Possibly this represents a viral syndrome.  I will have surgery evaluate his abdomen and CT.   I reviewed the LVAD parameters from today, and compared the results to the patient's prior recorded data.  No programming changes were made.  The LVAD is functioning within specified parameters.  The patient performs LVAD self-test daily.  LVAD interrogation was negative for any significant power changes, alarms or PI events/speed drops.  LVAD equipment check completed and is in good working order.  Back-up equipment present.   LVAD education done on emergency procedures and precautions and reviewed exit site care.  Marca Ancona 02/27/2012 8:17 AM  Length of Stay: 1  Fontaine Hehl Shirlee Latch 02/27/2012, 8:14 AM

## 2012-02-27 NOTE — Progress Notes (Signed)
ANTICOAGULATION CONSULT NOTE - Follow Up Consult  Pharmacy Consult for Heparin + Coumadin Indication: LVAD, suspected pump thrombosis  Allergies  Allergen Reactions  . Lexapro (Escitalopram Oxalate) Other (See Comments)    somnolence   Vital Signs: Temp: 99.1 F (37.3 C) (09/05 0606) Temp src: Oral (09/05 0418) Pulse Rate: 92  (09/05 0418)  Labs:  Basename 02/27/12 0640 02/26/12 2344 02/26/12 1550 02/26/12 1149 02/25/12 1106  HGB 11.4* -- -- 12.2* --  HCT 34.7* -- -- 36.9* --  PLT 331 -- -- 347 --  APTT -- -- -- -- --  LABPROT 20.4* -- 20.1* -- 27.3*  INR 1.71* -- 1.68* -- 2.5*  HEPARINUNFRC 0.27* 0.12* -- -- --  CREATININE -- -- -- 1.20 --  CKTOTAL -- -- -- -- --  CKMB -- -- -- -- --  TROPONINI -- -- -- -- --    Estimated Creatinine Clearance: 62.4 ml/min (by C-G formula based on Cr of 1.2).   Medications:  Heparin @ 1150 units/hr  Assessment: 51yom continues on heparin and coumadin. Heparin level is slightly below goal after re-bolus and rate increase this morning. INR also remains below goal but increased after higher dose given last night.  Noted drop in hemoglobin but platelets stable and no bleeding noted. N/V vomiting resolved, intussusception seen on CT scan, and no surgical intervention planned thus far.  Goal of Therapy:  Heparin level 0.3-0.5 units/ml INR 2-2.5 Monitor platelets by anticoagulation protocol: Yes   Plan:  1) Increase heparin to 1300 units/hr 2) 6h heparin level 3) Repeat coumadin 5mg  x 1 4) Follow up INR in AM 5) Continue to follow surgical plans and need to hold coumadin  Fredrik Rigger 02/27/2012,9:08 AM

## 2012-02-27 NOTE — Progress Notes (Addendum)
Patient back from small bowel study and reports passing gas and feeling better. Still need to collect urinalysis. Driveline dressing changed at bedside using sterile technique. Very lengthy discussion with patient and his mom about the importance of coumadin and therapeutic INRs. Patient given coumadin education book again to read and discussed in depth the complications that can arise from sub-therapeutic and supra-therapeutic INRs. Patient states he understands the importance and the risks that he takes if he dose not take as prescribed or follow the dietary recommendations. Pharmacy planning on following up with patient to do some more education.  patient examined and medical record reviewed,agree with above note. VAN TRIGT III,PETER 02/27/2012

## 2012-02-27 NOTE — Progress Notes (Signed)
Nurse paged [name], NP concerning Pt's temperature this AM, see flow sheet. NP said she is on her way to assess Pt and will report findings to Dr Algis Downs. Gala Romney. No new orders reecived at this time. Pt alert and oriented and resting at this time, will continue to monitor.

## 2012-02-27 NOTE — Consult Note (Addendum)
ANTICOAGULATION CONSULT NOTE - Follow Up Consult  Pharmacy Consult for Heparin + Coumadin Indication: LVAD, suspected pump thrombosis  Allergies  Allergen Reactions  . Lexapro (Escitalopram Oxalate) Other (See Comments)    somnolence    Patient Measurements: Height: 5\' 4"  (162.6 cm) Weight: 159 lb 13.3 oz (72.5 kg) IBW/kg (Calculated) : 59.2  Heparin Dosing Weight: 72.5kg  Vital Signs: Temp: 99.8 F (37.7 C) (09/04 2100) Temp src: Oral (09/04 2100) Pulse Rate: 77  (09/04 2100)  Labs:  Basename 02/26/12 2344 02/26/12 1550 02/26/12 1149 02/25/12 1106  HGB -- -- 12.2* --  HCT -- -- 36.9* --  PLT -- -- 347 --  APTT -- -- -- --  LABPROT -- 20.1* -- 27.3*  INR -- 1.68* -- 2.5*  HEPARINUNFRC 0.12* -- -- --  CREATININE -- -- 1.20 --  CKTOTAL -- -- -- --  CKMB -- -- -- --  TROPONINI -- -- -- --    Estimated Creatinine Clearance: 67.9 ml/min (by C-G formula based on Cr of 1.2).   Medical History: Past Medical History  Diagnosis Date  . CHF (congestive heart failure)     EF- 10-15  . Medically noncompliant   . Mitral regurgitation   . Tobacco user   . HTN (hypertension)   . AICD (automatic cardioverter/defibrillator) present   . GERD (gastroesophageal reflux disease)   . Substance abuse   . Chronic renal insufficiency   . Syncope   . Thrombus 08/06/2010  . SYSTOLIC HEART FAILURE, CHRONIC 09/22/2008    Qualifier: Diagnosis of  By: Gala Romney, MD, Trixie Dredge   . LV (left ventricular) mural thrombus 01/28/2011  . ICD - IN SITU 09/16/2008    Qualifier: Diagnosis of  By: Wonda Amis    . MITRAL STENOSIS/ INSUFFICIENCY, NON-RHEUMATIC 09/22/2008    Qualifier: Diagnosis of  By: Gala Romney, MD, Trixie Dredge Hepatomegaly 09/16/2008    Qualifier: Diagnosis of  By: Wonda Amis    . High cholesterol 02/26/2012    "at one time"  . Sleep apnea   . Exertional dyspnea 02/26/2012  . History of blood transfusion 08/2011    "when I had heart pump"  . Migraines    . COMMON MIGRAINE 06/14/2009    Qualifier: Diagnosis of  By: Jonny Ruiz MD, Len Blalock   . History of gout 02/26/2012  . Depression   . Bipolar affective disorder 10/22/2011    pt denies this hx 02/26/2012   Assessment: 51yom s/p LVAD in March 2013 presents with N/V x 3 days. He was on coumadin pta (3mg  daily) for hx LV thrombus and LVAD. Now with subtherapeutic INR and suspected pump thrombosis, on heparin. Will aim for a lower heparin goal given risk of bleeding with LVAD. Heparin level (0.12) is below-goal on 900 units/hr. No problem with line / infusion per RN.   Goal of Therapy:  Heparin level 0.3-0.5 units/ml INR 2-2.5 Monitor platelets by anticoagulation protocol: Yes   Plan: 1. Heparin IV bolus of 2000 units x 1, then increase IV infusion to 1150 units/hr. 2. Heparin level in 6 hours.    Emeline Gins 02/27/2012,12:44 AM

## 2012-02-27 NOTE — Care Management Note (Unsigned)
    Page 1 of 1   02/27/2012     1:48:11 PM   CARE MANAGEMENT NOTE 02/27/2012  Patient:  [name], [name]   Account Number:  [id number]  Date Initiated:  02/27/2012  Documentation initiated by:  Dierdre Mccalip  Subjective/Objective Assessment:   PT ADM ON 02/26/12 WITH N/V AND LACK OF APPETITE.  PT HAD LVAD PLACED IN [place] OF 2013 AT [facility].  PT REPORTEDLY LIVES WITH COUSINS WHO ASSIST WITH CARE.     Action/Plan:   PT CURRENTLY OFF FLOOR FOR TESTING.  WILL FOLLOW FOR HOME NEEDS AS PT PROGRESSES.   Anticipated DC Date:  03/01/2012   Anticipated DC Plan:  HOME W HOME HEALTH SERVICES      DC Planning Services  CM consult      Choice offered to / List presented to:             Status of service:  In process, will continue to follow Medicare Important Message given?   (If response is "NO", the following Medicare IM given date fields will be blank) Date Medicare IM given:   Date Additional Medicare IM given:    Discharge Disposition:    Per UR Regulation:  Reviewed for med. necessity/level of care/duration of stay  If discussed at Long Length of Stay Meetings, dates discussed:    Comments:

## 2012-02-27 NOTE — Progress Notes (Signed)
Pt back from small bowel series; pt wanting to eat something other than CL; CL diet ordered at this time; will page PA for new orders.

## 2012-02-27 NOTE — Progress Notes (Signed)
Pt off unit at this time for small bowel series; Charge nurse accompany pt; pt also sent down on portable machine with back up battery pack and tele monitor; will await pts return.

## 2012-02-27 NOTE — Consult Note (Signed)
Reason for Consult: Persistent N/V over past three days without abdomial pain Referring Physician: [name] is an 51 y.o. male.  [id number] to [facility] yesterday with persistent N/V for 3 days and can't keep any food or fluids down. He had complained of not feeling himself for the past 2-3 weeks. Denies abdominal pain, diarrhea, fevers or chills.  CT of the chest and abdomen revealed:There is a probable incidental intussusception involving  the proximal jejunum on image 48. The appendix appears normal. We are asked to see the patient for follow up and recommendations regarding this finding. At the time of exam, the patient denies any abdominal pain, stating that it is "getting better" and that he has not "spit-up" anything today and has been able to eat without abdominal pain or nausea. He states that he has had one other episode similar to this one about 7 months ago that resolved on it's own as well.  Patient states that he has had no previous abdominal surgeries. But does have a LVAD device and pacemaker implanted.    Past Medical History  Diagnosis Date  . CHF (congestive heart failure)     EF- 10-15  . Medically noncompliant   . Mitral regurgitation   . Tobacco user   . HTN (hypertension)   . AICD (automatic cardioverter/defibrillator) present   . GERD (gastroesophageal reflux disease)   . Substance abuse   . Chronic renal insufficiency   . Syncope   . Thrombus 08/06/2010  . SYSTOLIC HEART FAILURE, CHRONIC 09/22/2008    Qualifier: Diagnosis of  By: Eric Romney, MD, [name]   . LV (left ventricular) mural thrombus 01/28/2011  . ICD - IN SITU 09/16/2008    Qualifier: Diagnosis of  By: [name]    . MITRAL STENOSIS/ INSUFFICIENCY, NON-RHEUMATIC 09/22/2008    Qualifier: Diagnosis of  By: Eric Romney, MD, [name] Hepatomegaly 09/16/2008    Qualifier: Diagnosis of  By: [name]    . High cholesterol 02/26/2012    "at one  time"  . Sleep apnea   . Exertional dyspnea 02/26/2012  . History of blood transfusion 08/2011    "when I had heart pump"  . Migraines   . COMMON MIGRAINE 06/14/2009    Qualifier: Diagnosis of  By: Eric Ruiz MD, [name]   . History of gout 02/26/2012  . Depression   . Bipolar affective disorder 10/22/2011    pt denies this hx 02/26/2012    Past Surgical History  Procedure Date  . Cardiac defibrillator placement ~ 2008  . Left ventricular assist device 08/2011    Family History  Problem Relation Age of Onset  . Coronary artery disease Neg Hx     Social History:  reports that he quit smoking about 7 months ago. His smoking use included Cigarettes. He has a 8.25 pack-year smoking history. He quit smokeless tobacco use about 7 months ago. He reports that he does not drink alcohol or use illicit drugs.  Allergies:  Allergies  Allergen Reactions  . Lexapro (Escitalopram Oxalate) Other (See Comments)    somnolence    Medications: I have reviewed the patient's current medications.  Results for orders placed during the hospital encounter of 02/26/12 (from the past 48 hour(s))  CULTURE, BLOOD (ROUTINE X 2)     Status: Normal (Preliminary result)   Collection Time   02/26/12  3:40 PM      Component Value Range Comment   Specimen Description  BLOOD RIGHT ARM      Special Requests BOTTLES DRAWN AEROBIC ONLY 5CC      Culture  Setup Time 02/26/2012 22:57      Culture        Value:        BLOOD CULTURE RECEIVED NO GROWTH TO DATE CULTURE WILL BE HELD FOR 5 DAYS BEFORE ISSUING A FINAL NEGATIVE REPORT   Report Status PENDING     CULTURE, BLOOD (ROUTINE X 2)     Status: Normal (Preliminary result)   Collection Time   02/26/12  3:48 PM      Component Value Range Comment   Specimen Description BLOOD LEFT ARM      Special Requests BOTTLES DRAWN AEROBIC AND ANAEROBIC 10CC      Culture  Setup Time 02/26/2012 22:56      Culture        Value:        BLOOD CULTURE RECEIVED NO GROWTH TO DATE CULTURE WILL BE  HELD FOR 5 DAYS BEFORE ISSUING A FINAL NEGATIVE REPORT   Report Status PENDING     PROTIME-INR     Status: Abnormal   Collection Time   02/26/12  3:50 PM      Component Value Range Comment   Prothrombin Time 20.1 (*) 11.6 - 15.2 seconds    INR 1.68 (*) 0.00 - 1.49   HEPARIN LEVEL (UNFRACTIONATED)     Status: Abnormal   Collection Time   02/26/12 11:44 PM      Component Value Range Comment   Heparin Unfractionated 0.12 (*) 0.30 - 0.70 IU/mL   HEPARIN LEVEL (UNFRACTIONATED)     Status: Abnormal   Collection Time   02/27/12  6:40 AM      Component Value Range Comment   Heparin Unfractionated 0.27 (*) 0.30 - 0.70 IU/mL   CBC     Status: Abnormal   Collection Time   02/27/12  6:40 AM      Component Value Range Comment   WBC 4.8  4.0 - 10.5 K/uL    RBC 4.39  4.22 - 5.81 MIL/uL    Hemoglobin 11.4 (*) 13.0 - 17.0 g/dL    HCT 96.0 (*) 45.4 - 52.0 %    MCV 79.0  78.0 - 100.0 fL    MCH 26.0  26.0 - 34.0 pg    MCHC 32.9  30.0 - 36.0 g/dL    RDW 09.8 (*) 11.9 - 15.5 %    Platelets 331  150 - 400 K/uL   PROTIME-INR     Status: Abnormal   Collection Time   02/27/12  6:40 AM      Component Value Range Comment   Prothrombin Time 20.4 (*) 11.6 - 15.2 seconds    INR 1.71 (*) 0.00 - 1.49     Ct Abdomen Pelvis W Contrast  02/26/2012  *RADIOLOGY REPORT*  Clinical Data: Upper abdominal pain with nausea and vomiting. History of heart surgery and cardiac assist device.  CT ABDOMEN AND PELVIS WITH CONTRAST  Technique:  Multidetector CT imaging of the abdomen and pelvis was performed following the standard protocol during bolus administration of intravenous contrast.  Contrast: 80mL OMNIPAQUE IOHEXOL 300 MG/ML  SOLN, 1 OMNIPAQUE IOHEXOL 300 MG/ML  SOLN  Comparison: Chest radiographs 11/27/2011, acute abdominal series 10/17/2006.  Findings: AICD appears unchanged at the right ventricular apex. The patient has a left ventricular assist device which is incompletely imaged.  There is no evidence of paracardiac or  anterior mediastinal hemorrhage.  There is no pleural or pericardial effusion.  Patchy opacities are present at both lung bases.  The liver, gallbladder, biliary system and pancreas appear normal. The spleen and adrenal glands appear normal.  Both kidneys appear normal.  There is no hydronephrosis.  The bowel gas pattern is normal.  There are no inflammatory changes.  There is a probable incidental intussusception involving the proximal jejunum on image 48.  The appendix appears normal. There are no significant vascular abnormalities.  The prostate gland and bladder appear normal.  There is mildly asymmetric fat within the right inguinal canal.  There is lower lumbar spine degenerative disc disease, especially at L4-L5.  No acute osseous findings are seen.  IMPRESSION:  1.  No acute abdominal pelvic findings. 2.  Probable incidental transient proximal jejunal intussusception. No evidence of bowel obstruction or surrounding inflammation. 3.  Left ventricular assist device without demonstrated complication.   Original Report Authenticated By: Gerrianne Scale, M.D.     Review of Systems  Constitutional: Positive for malaise/fatigue.  HENT: Negative.   Eyes: Negative.   Respiratory: Positive for cough. Negative for hemoptysis, sputum production, shortness of breath and wheezing.        Described as persistent dry cough  Cardiovascular: Negative for chest pain, palpitations, orthopnea, claudication, leg swelling and PND.       HX of CHF Has LVAD device implanted  Gastrointestinal: Positive for nausea and vomiting. Negative for heartburn, abdominal pain, diarrhea, constipation, blood in stool and melena.  Genitourinary: Negative.   Musculoskeletal: Negative.   Skin: Negative.   Neurological: Negative.   Endo/Heme/Allergies: Negative.   Psychiatric/Behavioral: Negative.    Blood pressure 86/1, pulse 92, temperature 99.1 F (37.3 C), temperature source Oral, resp. rate 20, height 5\' 4"  (1.626 m),  weight 156 lb 4.8 oz (70.897 kg), SpO2 95.00%. Physical Exam  Constitutional: He is oriented to person, place, and time. He appears well-developed and well-nourished. No distress.  HENT:  Head: Normocephalic and atraumatic.  Mouth/Throat: No oropharyngeal exudate.  Eyes: Pupils are equal, round, and reactive to light. Right eye exhibits no discharge. Left eye exhibits no discharge. No scleral icterus.  Neck: Normal range of motion. Neck supple. No JVD present. No tracheal deviation present. No thyromegaly present.  Cardiovascular: Normal rate.        Patient has LVAD device difficult to fully assess heart sounds.  Respiratory: Effort normal and breath sounds normal. No stridor. No respiratory distress. He has no wheezes. He has no rales. He exhibits no tenderness.  GI: Soft. Bowel sounds are normal. He exhibits no distension and no mass. There is no tenderness. There is no rebound and no guarding.  Musculoskeletal: Normal range of motion. He exhibits no edema and no tenderness.  Lymphadenopathy:    He has no cervical adenopathy.  Neurological: He is alert and oriented to person, place, and time.  Skin: Skin is warm and dry. No rash noted. He is not diaphoretic. No erythema. No pallor.  Psychiatric: He has a normal mood and affect.    Assessment/Plan:  Patient Active Problem List  Diagnosis  . MITRAL STENOSIS/ INSUFFICIENCY, NON-RHEUMATIC  . CONGESTIVE HEART FAILURE  . SYSTOLIC HEART FAILURE, CHRONIC  . BACK PAIN  . Other malaise and fatigue  . ABNORMAL HEART SOUNDS  . DYSPNEA  . Hepatomegaly  . ABNORMAL ECHOCARDIOGRAM  . ICD-[employer]  . TOBACCO ABUSE  . Thrombus  . LV (left ventricular) mural thrombus  . Acute on chronic systolic heart failure  . Cardiogenic  shock  . Hypotension  . Gastritis, acute  . Preventative health care  . Bipolar affective disorder  . Migraine  . Ventricular tachycardia  . SVT (supraventricular tachycardia)  . LVAD (left ventricular  assist device) present  . Nausea and vomiting  probable incidental intussusception involving the proximal jejunum   Plan: 1. Recommend obtaining a small bowel follow through study to r/o any pathology. (we will schedule that today if possible with radiology) 2. Okay to continue clear liquid diet for now as he seems to be resolving on his own. And has not had any further episodes of N/V or bloating.  We will continue to follow along with you, thank you for this consult.   DUANNE, JEREMY 02/27/2012, 8:31 AM   I am not sure what to make of this finding of possible intusussception.  His symptoms seem to be resolved now.  This could be gastroenteritis or gallbladder problems as well since he complains of nausea after eating any fatty foods.  [place] may be helpful to evaluate for cholelithiasis.  However, even if he had symptomatic cholelithiasis, with his cardiac issues, not sure that he would be a good surgical candidate.  SBFT appeared to confirm intusussception but this is not obstructing and his symptoms seem to have improved. In an adult, this would be concerning for a mass or tumor but no famhx of this and again, not a good surgical candidate to evaluate with surgery since this would be low yield, especially if he remains asymptomatic. For now, I would follow his symptoms and if he continues to resolve, follow this up as an outpatient.

## 2012-02-28 LAB — URINE CULTURE: Culture: NO GROWTH

## 2012-02-28 LAB — PROTIME-INR
INR: 2.2 — ABNORMAL HIGH (ref 0.00–1.49)
Prothrombin Time: 24.8 seconds — ABNORMAL HIGH (ref 11.6–15.2)

## 2012-02-28 LAB — COMPREHENSIVE METABOLIC PANEL
ALT: 232 U/L — ABNORMAL HIGH (ref 0–53)
AST: 169 U/L — ABNORMAL HIGH (ref 0–37)
Albumin: 2.9 g/dL — ABNORMAL LOW (ref 3.5–5.2)
Alkaline Phosphatase: 115 U/L (ref 39–117)
GFR calc Af Amer: 81 mL/min — ABNORMAL LOW (ref >90)
Glucose, Bld: 107 mg/dL — ABNORMAL HIGH (ref 70–99)
Potassium: 3.9 mEq/L (ref 3.5–5.1)
Sodium: 135 mEq/L (ref 135–145)
Total Protein: 7 g/dL (ref 6.0–8.3)

## 2012-02-28 LAB — CBC
Platelets: 338 10*3/uL (ref 150–400)
RBC: 4.57 MIL/uL (ref 4.22–5.81)
RDW: 17.9 % — ABNORMAL HIGH (ref 11.5–15.5)
WBC: 4.6 10*3/uL (ref 4.0–10.5)

## 2012-02-28 LAB — HAPTOGLOBIN: Haptoglobin: <25 mg/dL — ABNORMAL LOW (ref 45–215)

## 2012-02-28 LAB — LACTATE DEHYDROGENASE: LDH: 480 U/L — ABNORMAL HIGH (ref 94–250)

## 2012-02-28 LAB — HEPARIN LEVEL (UNFRACTIONATED): Heparin Unfractionated: 0.31 IU/mL (ref 0.30–0.70)

## 2012-02-28 MED ORDER — MOXIFLOXACIN HCL 400 MG PO TABS
400.0000 mg | ORAL_TABLET | Freq: Every day | ORAL | Status: DC
  Administered 2012-02-28: 400 mg via ORAL
  Filled 2012-02-28: qty 1

## 2012-02-28 MED ORDER — WARFARIN SODIUM 3 MG PO TABS: 4.5000 mg | ORAL_TABLET | Freq: Every day | ORAL | Status: DC

## 2012-02-28 MED ORDER — FLUTICASONE PROPIONATE 50 MCG/ACT NA SUSP
1.0000 | Freq: Every day | NASAL | Status: DC
  Administered 2012-02-28: 1 via NASAL
  Filled 2012-02-28: qty 16

## 2012-02-28 MED ORDER — TIOTROPIUM BROMIDE MONOHYDRATE 18 MCG IN CAPS
18.0000 ug | ORAL_CAPSULE | Freq: Every day | RESPIRATORY_TRACT | Status: DC
  Administered 2012-02-28: 18 ug via RESPIRATORY_TRACT
  Filled 2012-02-28: qty 5

## 2012-02-28 MED ORDER — CEPHALEXIN 500 MG PO CAPS: 500.0000 mg | ORAL_CAPSULE | Freq: Four times a day (QID) | ORAL | Status: DC

## 2012-02-28 MED ORDER — ASPIRIN 81 MG PO CHEW
CHEWABLE_TABLET | ORAL | Status: AC
  Filled 2012-02-28: qty 1

## 2012-02-28 NOTE — Progress Notes (Signed)
Subjective: Feels good this morning no report of further N/V +flatus and BM  Objective: Vital signs in last 24 hours: Temp:  [98.4 F (36.9 C)-98.8 F (37.1 C)] 98.8 F (37.1 C) (09/06 0431) Pulse Rate:  [74-88] 88  (09/06 0431) SpO2:  [93 %-98 %] 93 % (09/06 0431) Weight:  [160 lb 8 oz (72.802 kg)] 160 lb 8 oz (72.802 kg) (09/06 0812) Last BM Date: 02/28/12  Intake/Output from previous day: 09/05 0701 - 09/06 0700 In: 480 [P.O.:480] Out: 451 [Urine:450; Stool:1] Intake/Output this shift:   General appearance: A/A/O no distress Chest: CTA Abdomen: soft, non tender, no N?V/bloating VSS, afebrile, labs stable   Lab Results:   [facility] 02/28/12 0605 02/27/12 0640  WBC 4.6 4.8  HGB 11.8* 11.4*  HCT 35.7* 34.7*  PLT 338 331   BMET  Basename 02/28/12 0605 02/26/12 1149  NA 135 142  K 3.9 3.7  CL 102 102  CO2 25 25  GLUCOSE 107* 97  BUN 5* 11  CREATININE 1.19 1.20  CALCIUM 8.7 9.6   PT/INR  Basename 02/28/12 0605 02/27/12 0640  LABPROT 24.8* 20.4*  INR 2.20* 1.71*   ABG No results found for this basename: PHART:2,PCO2:2,PO2:2,HCO3:2 in the last 72 hours  Studies/Results: Ct Abdomen Pelvis W Contrast  02/26/2012  *RADIOLOGY REPORT*  Clinical Data: Upper abdominal pain with nausea and vomiting. History of heart surgery and cardiac assist device.  CT ABDOMEN AND PELVIS WITH CONTRAST  Technique:  Multidetector CT imaging of the abdomen and pelvis was performed following the standard protocol during bolus administration of intravenous contrast.  Contrast: 80mL OMNIPAQUE IOHEXOL 300 MG/ML  SOLN, 1 OMNIPAQUE IOHEXOL 300 MG/ML  SOLN  Comparison: Chest radiographs 11/27/2011, acute abdominal series 10/17/2006.  Findings: AICD appears unchanged at the right ventricular apex. The patient has a left ventricular assist device which is incompletely imaged.  There is no evidence of paracardiac or anterior mediastinal hemorrhage.  There is no pleural or pericardial effusion.   Patchy opacities are present at both lung bases.  The liver, gallbladder, biliary system and pancreas appear normal. The spleen and adrenal glands appear normal.  Both kidneys appear normal.  There is no hydronephrosis.  The bowel gas pattern is normal.  There are no inflammatory changes.  There is a probable incidental intussusception involving the proximal jejunum on image 48.  The appendix appears normal. There are no significant vascular abnormalities.  The prostate gland and bladder appear normal.  There is mildly asymmetric fat within the right inguinal canal.  There is lower lumbar spine degenerative disc disease, especially at L4-L5.  No acute osseous findings are seen.  IMPRESSION:  1.  No acute abdominal pelvic findings. 2.  Probable incidental transient proximal jejunal intussusception. No evidence of bowel obstruction or surrounding inflammation. 3.  Left ventricular assist device without demonstrated complication.   Original Report Authenticated By: Gerrianne Scale, M.D.    Dg Small Bowel  02/27/2012  *RADIOLOGY REPORT*  Clinical Data: Proximal jejunal intussusception on CT.  SMALL BOWEL SERIES  Technique: Fluoro time was 1 minute 47 seconds  Comparison:  02/26/2012 CT scan  Findings: Left ventricular assist device noted.  Initial KUB demonstrates contrast medium in the rectum.  On the initial 15-minute image, there is contrast in the colon and proximal small bowel, and by 30 minutes contrast is extended into the hepatic flexure of the colon.  On prior CT there was jejuno-jejunal intussusception with a 1.3 cm involved segment.  The appearance is subtly suggested  on series #10, but does not appear obstructive.  A specific lead point/cause for this small area of intussusception is not identified; folds in this vicinity appear fairly normal.  No other significant abnormal filling defects, fold thickening, dilatation, or other significant small bowel abnormality observed. The terminal ileum appears  normal.  IMPRESSION:  1.  Spot imaging suggests a persistent small jejuno-jejunal intussusception, without obstruction, obvious fold thickening, or other visible cause. This did not reduce with paddle compression. 2.  Slightly fast transit time through the small bowel, under 30 minutes.   Original Report Authenticated By: Dellia Cloud, M.D.    Dg Chest Port 1 View  02/27/2012  *RADIOLOGY REPORT*  Clinical Data: Fever and cough.  PORTABLE CHEST - 1 VIEW  Comparison: PA and lateral chest 11/27/2011.  Findings: There is cardiomegaly but no pulmonary edema.  AICD and ventricular assist device are again seen.  Lungs are clear.  No pneumothorax or pleural fluid.  IMPRESSION: Cardiomegaly without acute disease.   Original Report Authenticated By: Bernadene Bell. Maricela Curet, M.D.     Anti-infectives: Anti-infectives     Start     Dose/Rate Route Frequency Ordered Stop   02/26/12 1800   moxifloxacin (AVELOX) tablet 400 mg        400 mg Oral Daily-1800 02/26/12 1507 03/04/12 1759          Assessment/Plan: s/p * No surgery found * Advance diet   LOS: 2 days    Gwenith Tschida 02/28/2012

## 2012-02-28 NOTE — Progress Notes (Signed)
HeartMate 2 Rounding Note  Subjective:    [name] is 51 y/o [place] male with a history of severe CHF due to NICM EF 10-15%, and severe MR previously on home milrinone. He had a HM II LVAD placed at [name] March 2013. His medical history also consists of CRI, NSVT s/p ICD and LV thrombus.   Recently seen at [facility] and LFTs were mildly elevated, CR, up slightly along with LDH, and INR low. Diuretics were held, Amio stopped and placed on lovenox. Followed up with [facility] Failure Clinic last week and LFTs and Cr trending down.   Admitted from clinic 02/26/12 for persistent N/V for 3 days, along with persistent cough and nasal congestion. AST-213 and ALT-262, and + ketones, protein and urobilinogen in urine. INR 1.68, heparin gtt started. CT scan transient proximal jejunal intussusception.  Small bowel study yesterday, awaiting clearance from GI to advance diet. Patient denies any abdominal pain or N/V. Reports loose bowel movement. Denies any SOB/orthopnea/edema. "Feels good and ready to go home." + persistent cough.   LVAD INTERROGATION:  HeartMate II LVAD:  Flow 4.6 liters/min, speed 9200, power 5.7, PI 6.3.   Objective:    Vital Signs:   Temp:  [98.4 F (36.9 C)-98.8 F (37.1 C)] 98.8 F (37.1 C) (09/06 0431) Pulse Rate:  [74-88] 88  (09/06 0431) SpO2:  [93 %-98 %] 93 % (09/06 0431) Weight:  [160 lb 8 oz (72.802 kg)] 160 lb 8 oz (72.802 kg) (09/06 0812) Last BM Date: 02/28/12 Mean arterial Pressure 92  Intake/Output:   Intake/Output Summary (Last 24 hours) at 02/28/12 0900 Last data filed at 02/27/12 1851  Gross per 24 hour  Intake    480 ml  Output    451 ml  Net     29 ml     Physical Exam: General:  Well appearing. No resp difficulty HEENT: normal Neck: supple. JVP 5-6. Carotids 2+ bilat; no bruits. No lymphadenopathy or thryomegaly appreciated. Cor: Mechanical heart sounds with LVAD hum present. Lungs: clear to ausculation bilaterally Abdomen: soft, nontender,  nondistended. No hepatosplenomegaly. No bruits or masses. BSx4;  LVAD exit site: dressing C/D/I; stabilization device intact Extremities: no cyanosis, clubbing, rash, edema Neuro: alert & orientedx3, cranial nerves grossly intact. moves all 4 extremities w/o difficulty. Affect pleasant  Telemetry: NSR 80 bbp  Labs: Basic Metabolic Panel:  Lab 02/28/12 1610 02/26/12 1149 02/21/12 1122  NA 135 142 136  K 3.9 3.7 4.2  CL 102 102 99  CO2 25 25 24   GLUCOSE 107* 97 111*  BUN 5* 11 11  CREATININE 1.19 1.20 1.21  CALCIUM 8.7 9.6 9.6  MG -- -- --  PHOS -- -- --    Liver Function Tests:  Lab 02/28/12 0605 02/26/12 1149 02/21/12 1122  AST 169* 213* 135*  ALT 232* 262* 185*  ALKPHOS 115 139* 138*  BILITOT 0.6 0.8 0.4  PROT 7.0 8.4* 8.4*  ALBUMIN 2.9* 3.7 3.7    Lab 02/26/12 1149  LIPASE 27  AMYLASE 47   No results found for this basename: AMMONIA:3 in the last 168 hours  CBC:  Lab 02/28/12 0605 02/27/12 0640 02/26/12 1149 02/21/12 1122  WBC 4.6 4.8 4.9 5.0  NEUTROABS -- -- -- --  HGB 11.8* 11.4* 12.2* 12.9*  HCT 35.7* 34.7* 36.9* 38.6*  MCV 78.1 79.0 78.8 79.1  PLT 338 331 347 292    INR:  Lab 02/28/12 0605 02/27/12 0640 02/26/12 1550 02/25/12 1106 02/21/12 1305  INR 2.20* 1.71* 1.68* 2.5*  1.57*    Other results:  EKG:   Imaging: Ct Abdomen Pelvis W Contrast  02/26/2012  *RADIOLOGY REPORT*  Clinical Data: Upper abdominal pain with nausea and vomiting. History of heart surgery and cardiac assist device.  CT ABDOMEN AND PELVIS WITH CONTRAST  Technique:  Multidetector CT imaging of the abdomen and pelvis was performed following the standard protocol during bolus administration of intravenous contrast.  Contrast: 80mL OMNIPAQUE IOHEXOL 300 MG/ML  SOLN, 1 OMNIPAQUE IOHEXOL 300 MG/ML  SOLN  Comparison: Chest radiographs 11/27/2011, acute abdominal series 10/17/2006.  Findings: AICD appears unchanged at the right ventricular apex. The patient has a left ventricular assist  device which is incompletely imaged.  There is no evidence of paracardiac or anterior mediastinal hemorrhage.  There is no pleural or pericardial effusion.  Patchy opacities are present at both lung bases.  The liver, gallbladder, biliary system and pancreas appear normal. The spleen and adrenal glands appear normal.  Both kidneys appear normal.  There is no hydronephrosis.  The bowel gas pattern is normal.  There are no inflammatory changes.  There is a probable incidental intussusception involving the proximal jejunum on image 48.  The appendix appears normal. There are no significant vascular abnormalities.  The prostate gland and bladder appear normal.  There is mildly asymmetric fat within the right inguinal canal.  There is lower lumbar spine degenerative disc disease, especially at L4-L5.  No acute osseous findings are seen.  IMPRESSION:  1.  No acute abdominal pelvic findings. 2.  Probable incidental transient proximal jejunal intussusception. No evidence of bowel obstruction or surrounding inflammation. 3.  Left ventricular assist device without demonstrated complication.   Original Report Authenticated By: Gerrianne Scale, M.D.    Dg Small Bowel  02/27/2012  *RADIOLOGY REPORT*  Clinical Data: Proximal jejunal intussusception on CT.  SMALL BOWEL SERIES  Technique: Fluoro time was 1 minute 47 seconds  Comparison:  02/26/2012 CT scan  Findings: Left ventricular assist device noted.  Initial KUB demonstrates contrast medium in the rectum.  On the initial 15-minute image, there is contrast in the colon and proximal small bowel, and by 30 minutes contrast is extended into the hepatic flexure of the colon.  On prior CT there was jejuno-jejunal intussusception with a 1.3 cm involved segment.  The appearance is subtly suggested on series #10, but does not appear obstructive.  A specific lead point/cause for this small area of intussusception is not identified; folds in this vicinity appear fairly normal.  No  other significant abnormal filling defects, fold thickening, dilatation, or other significant small bowel abnormality observed. The terminal ileum appears normal.  IMPRESSION:  1.  Spot imaging suggests a persistent small jejuno-jejunal intussusception, without obstruction, obvious fold thickening, or other visible cause. This did not reduce with paddle compression. 2.  Slightly fast transit time through the small bowel, under 30 minutes.   Original Report Authenticated By: Dellia Cloud, M.D.    Dg Chest Port 1 View  02/27/2012  *RADIOLOGY REPORT*  Clinical Data: Fever and cough.  PORTABLE CHEST - 1 VIEW  Comparison: PA and lateral chest 11/27/2011.  Findings: There is cardiomegaly but no pulmonary edema.  AICD and ventricular assist device are again seen.  Lungs are clear.  No pneumothorax or pleural fluid.  IMPRESSION: Cardiomegaly without acute disease.   Original Report Authenticated By: Bernadene Bell. Maricela Curet, M.D.      Medications:     Scheduled Medications:    . aspirin  81 mg Oral Daily  .  carvedilol  25 mg Oral BID WC  . ferrous gluconate  324 mg Oral TID WC  . fluticasone  1 spray Each Nare Daily  . gabapentin  300 mg Oral TID  . hydrALAZINE  75 mg Oral Q8H  . lisinopril  2.5 mg Oral Daily  . magnesium oxide  800 mg Oral Daily  . moxifloxacin  400 mg Oral q1800  . pantoprazole  40 mg Oral Q1200  . tiotropium  18 mcg Inhalation Daily  . warfarin  5 mg Oral ONCE-1800  . Warfarin - Pharmacist Dosing Inpatient   Does not apply q1800    Infusions:    . dextrose 5 % and 0.45% NaCl 50 mL/hr at 02/27/12 1632  . heparin 1,300 Units/hr (02/27/12 1355)  . DISCONTD: heparin 1,150 Units/hr (02/27/12 0116)    PRN Medications: acetaminophen   Assessment:   1) Nausea/Vomiting  2) Chronic Systolic HF              -EF 10-15%  3) NICM              - s/p LVAD 08/2011  4) Transaminitis    Plan/Discussion:   Suspect transient partial SBO from mild jejunal intussusception.   Discussed with surgery, plan conservative management.   No events overnight and feeling ok this morning. Urine culture and haptaglobin pending. Blood cultures negative at this time. LDH trending down along with LFT's. Hepatitis panels and HIV (-). INR 2.2. Continues to have persistent cough, but likely viral in nature. Will start nasonex and spiriva.  Will advance diet today, discharge this afternoon/evening if doing well with followup in the office next week.   I reviewed the LVAD parameters from today, and compared the results to the patient's prior recorded data.  No programming changes were made.  The LVAD is functioning within specified parameters.  The patient performs LVAD self-test daily.  LVAD interrogation was negative for any significant power changes or alarms. Few PI events, however none sustained.  LVAD equipment check completed and is in good working order.  Back-up equipment present.   LVAD education done on emergency procedures and precautions and reviewed exit site care.  Marca Ancona 02/28/2012 9:02 AM  Length of Stay: 2

## 2012-03-02 ENCOUNTER — Other Ambulatory Visit: Payer: Medicare Other

## 2012-03-03 ENCOUNTER — Other Ambulatory Visit: Payer: Medicare Other

## 2012-03-03 ENCOUNTER — Ambulatory Visit (HOSPITAL_COMMUNITY)
Admission: RE | Admit: 2012-03-03 | Discharge: 2012-03-03 | Disposition: A | Discharge status: Home / Self Care | Payer: Medicare Other | Source: Ambulatory Visit | Attending: Internal Medicine | Admitting: Internal Medicine

## 2012-03-03 ENCOUNTER — Ambulatory Visit (INDEPENDENT_AMBULATORY_CARE_PROVIDER_SITE_OTHER): Payer: Medicare Other | Admitting: *Deleted

## 2012-03-03 ENCOUNTER — Telehealth (HOSPITAL_COMMUNITY): Payer: Self-pay | Admitting: *Deleted

## 2012-03-03 VITALS — BP 104/1 | Temp 98.3°F | Wt 164.5 lb

## 2012-03-03 DIAGNOSIS — I5022 Chronic systolic (congestive) heart failure: Secondary | ICD-10-CM | POA: Insufficient documentation

## 2012-03-03 DIAGNOSIS — R059 Cough, unspecified: Secondary | ICD-10-CM | POA: Insufficient documentation

## 2012-03-03 DIAGNOSIS — I509 Heart failure, unspecified: Secondary | ICD-10-CM

## 2012-03-03 DIAGNOSIS — R05 Cough: Secondary | ICD-10-CM

## 2012-03-03 DIAGNOSIS — I8290 Acute embolism and thrombosis of unspecified vein: Secondary | ICD-10-CM

## 2012-03-03 DIAGNOSIS — I749 Embolism and thrombosis of unspecified artery: Secondary | ICD-10-CM

## 2012-03-03 LAB — CULTURE, BLOOD (ROUTINE X 2): Culture: NO GROWTH

## 2012-03-03 MED ORDER — LORATADINE 10 MG PO CAPS: 1.0000 | ORAL_CAPSULE | Freq: Every day | ORAL | Status: DC

## 2012-03-03 MED ORDER — TORSEMIDE 20 MG PO TABS: 20.0000 mg | ORAL_TABLET | Freq: Every day | ORAL | Status: DC

## 2012-03-03 MED ORDER — DEXTROMETHORPHAN HBR 15 MG/5ML PO SYRP: 10.0000 mL | ORAL_SOLUTION | Freq: Four times a day (QID) | ORAL | Status: AC | PRN

## 2012-03-03 NOTE — Patient Instructions (Addendum)
Take torsemide 20 mg (fluid pill) x 2 days.   Take 20 meq Potassium x 2 days.  Start taking Cough medicine as prescribed, you can take up to 4 times a day.  If you want to get over the counter Claritin (loratadine) or Zyrtec (cetirizine) and start taking daily you can.   Follow up 2 weeks.

## 2012-03-03 NOTE — Telephone Encounter (Signed)
Per [name], NP pt needs an LDH, per [place] pt is really sick and can't stop coughing after labs he will come to clinic to be seen

## 2012-03-03 NOTE — Assessment & Plan Note (Addendum)
NYHA II, volume status slightly elevated. Will have patient take torsemide 20 mg, along with 20 meq potassium x 2 days. Have re-stressed to patient about weighing daily and using sliding scale diuretics. Patient continues to have persistent cough, which is most likely viral. INR and LDH drawn today and will fax results to [facility]. Haptaglobin was low during recent hospital admission, will continue to monitor closely. Follow up [redacted] weeks along with ramp ECHO.

## 2012-03-03 NOTE — Progress Notes (Signed)
HPI: [name] is 51 y/o [place] American male with a history of severe CHF due to NICM EF 10-15%, and severe MR previously on home milrinone. He had a HM II LVAD placed at [facility] March 2013. His medical history also consists of CRI, NSVT s/p ICD and LV thrombus.   Recently seen at [facility] and LFTs were mildly elevated and CR up slightly. Also INR low. Diuretics held and place on lovenox. Admitted to the hospital  02/26/12 from clinic for N/V and persistent cough.  Follow up: Patient d/c from hospital on 02/28/12 for N/V and persistent cough. LFTs were elevated and haptoglobin low. CXR negative and was started on antibiotics, which he has since finished. Comes in today for continued cough with clear sputum. Denies any abdominal pain, N/V, or fever. + Chills at times. Weight slightly up. Reports + BM and his appetite is increasing.  Denies LVAD alarms.  Denies driveline trauma, erythema or drainage.  Denies ICD shocks.   Reports taking Coumadin as prescribed and adherence to anticoagulation based dietary restrictions.  Denies bright red blood per rectum or melena, no dark urine or hematuria.    Past Medical History  Diagnosis Date  . CHF (congestive heart failure)     EF- 10-15  . Medically noncompliant   . Mitral regurgitation   . Tobacco user   . HTN (hypertension)   . AICD (automatic cardioverter/defibrillator) present   . GERD (gastroesophageal reflux disease)   . Substance abuse   . Chronic renal insufficiency   . Syncope   . Thrombus 08/06/2010  . SYSTOLIC HEART FAILURE, CHRONIC 09/22/2008    Qualifier: Diagnosis of  By: Gala Romney, MD, Trixie Dredge   . LV (left ventricular) mural thrombus 01/28/2011  . ICD - IN SITU 09/16/2008    Qualifier: Diagnosis of  By: Wonda Amis    . MITRAL STENOSIS/ INSUFFICIENCY, NON-RHEUMATIC 09/22/2008    Qualifier: Diagnosis of  By: Gala Romney, MD, Trixie Dredge Hepatomegaly 09/16/2008    Qualifier: Diagnosis of  By: Wonda Amis    . High  cholesterol 02/26/2012    "at one time"  . Sleep apnea   . Exertional dyspnea 02/26/2012  . History of blood transfusion 08/2011    "when I had heart pump"  . Migraines   . COMMON MIGRAINE 06/14/2009    Qualifier: Diagnosis of  By: Jonny Ruiz MD, Len Blalock   . History of gout 02/26/2012  . Depression   . Bipolar affective disorder 10/22/2011    pt denies this hx 02/26/2012    Current Outpatient Prescriptions  Medication Sig Dispense Refill  . allopurinol (ZYLOPRIM) 100 MG tablet Take 100 mg by mouth as needed. For gout      . aspirin EC 81 MG tablet Take 81 mg by mouth daily.      . carvedilol (COREG) 25 MG tablet Take 1 tablet (25 mg total) by mouth 2 (two) times daily with a meal.  60 tablet  6  . ferrous gluconate (FERGON) 324 MG tablet Take 324 mg by mouth 3 (three) times daily with meals.      . gabapentin (NEURONTIN) 300 MG capsule Take 1 capsule (300 mg total) by mouth 3 (three) times daily.  90 capsule  6  . hydrALAZINE (APRESOLINE) 50 MG tablet Take 1.5 tablets (75 mg total) by mouth 3 (three) times daily.  90 tablet  6  . lisinopril (PRINIVIL,ZESTRIL) 2.5 MG tablet Take 2.5 mg by mouth daily.      Marland Kitchen  Magnesium Oxide (MAG-OX 400 PO) Take 800 mg by mouth 2 (two) times daily as needed.       . ranitidine (ZANTAC) 150 MG tablet Take 150 mg by mouth 2 (two) times daily.      Marland Kitchen warfarin (COUMADIN) 3 MG tablet Take 1.5 tablets (4.5 mg total) by mouth daily.  30 tablet  3  . DISCONTD: isosorbide mononitrate (IMDUR) 30 MG 24 hr tablet Take 1 tablet (30 mg total) by mouth daily.  60 tablet  6    Lexapro  REVIEW OF SYSTEMS: All systems negative except as listed in HPI, PMH and Problem list.   LVAD INTERROGATION:   HeartMate II LVAD:  Flow 5.3 liters/min, speed 9200, power 6.2, PI 5.8.    I reviewed the LVAD parameters from today, and compared the results to the patient's prior recorded data.  No programming changes were made.  The LVAD is functioning within specified parameters.  The patient  performs LVAD self-test daily.  LVAD interrogation few power spikes, however non-sustained. Multiple PI events most likely related to cough.  LVAD equipment check completed and is in good working order.  Back-up equipment present.   LVAD education done on emergency procedures and precautions and reviewed exit site care.   Physical Exam: There were no vitals filed for this visit.  GENERAL: Well appearing, male who presents to clinic today in no acute distress. HEENT: normal  NECK: Supple, JVP 7-8.  2+ bilaterally, no bruits.  No lymphadenopathy or thyromegaly appreciated.   CARDIAC:  Mechanical heart sounds with LVAD hum present.  LUNGS:  Clear to auscultation bilaterally.  ABDOMEN:  Soft, round, nontender, positive bowel sounds x4.     LVAD exit site: well-healed and incorporated.  Dressing dry and intact. Stabilization device present and accurately applied.  Driveline dressing is being changed daily per sterile technique. EXTREMITIES:  Warm and dry, no cyanosis, clubbing, rash or edema  NEUROLOGIC:  Alert and oriented x 4.  Gait steady.  No aphasia.  No dysarthria.  Affect pleasant.     ASSESSMENT AND PLAN:   1. Status post LVAD implantation - Patient comes to clinic today for routine follow up visit. Currently has NYHA class II symptoms on LVAD support. Plan to follow up at next routine visit.      2.  Anticoagulation management - INR goal 2.0-2.5.  Will evaluate INR value today and follow up with patient for necessary changes.

## 2012-03-03 NOTE — Assessment & Plan Note (Addendum)
Patient has had persistent cough x 3 weeks. Finished 7 day course of antibiotics today and continues to have + cough. Most likely viral, however dose have some extra fluid on board. Will increase diuretics x 2 days and start dextromethorphan 15 ml up to 4x daily. Prescription for loratadine sent to pharmacy. Follow up in 3 weeks or sooner if not feeling better.

## 2012-03-04 ENCOUNTER — Ambulatory Visit (HOSPITAL_COMMUNITY): Payer: Medicare Other

## 2012-03-05 ENCOUNTER — Ambulatory Visit (INDEPENDENT_AMBULATORY_CARE_PROVIDER_SITE_OTHER): Payer: Medicare Other | Admitting: *Deleted

## 2012-03-05 ENCOUNTER — Other Ambulatory Visit (HOSPITAL_COMMUNITY): Payer: Self-pay | Admitting: Anesthesiology

## 2012-03-05 DIAGNOSIS — I749 Embolism and thrombosis of unspecified artery: Secondary | ICD-10-CM

## 2012-03-05 DIAGNOSIS — I8290 Acute embolism and thrombosis of unspecified vein: Secondary | ICD-10-CM

## 2012-03-05 DIAGNOSIS — I5022 Chronic systolic (congestive) heart failure: Secondary | ICD-10-CM

## 2012-03-05 LAB — PROTIME-INR
INR: 3.3 ratio — ABNORMAL HIGH (ref 0.8–1.0)
Prothrombin Time: 36.1 s — ABNORMAL HIGH (ref 10.2–12.4)

## 2012-03-05 LAB — MISCELLANEOUS TEST

## 2012-03-06 LAB — LACTATE DEHYDROGENASE: LDH: 470 U/L — ABNORMAL HIGH (ref 94–250)

## 2012-03-09 ENCOUNTER — Telehealth (HOSPITAL_COMMUNITY): Payer: Self-pay | Admitting: *Deleted

## 2012-03-09 DIAGNOSIS — I5022 Chronic systolic (congestive) heart failure: Secondary | ICD-10-CM

## 2012-03-09 NOTE — Telephone Encounter (Signed)
Per [facility] pt needs INR and LDH on wed 9/18, order placed pt aware

## 2012-03-10 ENCOUNTER — Other Ambulatory Visit (HOSPITAL_COMMUNITY): Payer: Self-pay | Admitting: Internal Medicine

## 2012-03-10 ENCOUNTER — Other Ambulatory Visit: Payer: Medicare Other

## 2012-03-10 DIAGNOSIS — I5022 Chronic systolic (congestive) heart failure: Secondary | ICD-10-CM

## 2012-03-10 LAB — PROTIME-INR
INR: 2.5 ratio — ABNORMAL HIGH (ref 0.8–1.0)
Prothrombin Time: 27.9 s — ABNORMAL HIGH (ref 10.2–12.4)

## 2012-03-11 ENCOUNTER — Other Ambulatory Visit: Payer: Medicare Other

## 2012-03-11 ENCOUNTER — Encounter (HOSPITAL_COMMUNITY): Payer: Medicare Other

## 2012-03-11 ENCOUNTER — Encounter: Payer: Self-pay | Admitting: Internal Medicine

## 2012-03-11 LAB — LACTATE DEHYDROGENASE: LDH: 473 U/L — ABNORMAL HIGH (ref 94–250)

## 2012-03-13 NOTE — Discharge Summary (Signed)
Advanced Heart Failure Team  Discharge Summary   Patient ID: [name] MRN: [id number], DOB/AGE: 1960/07/28 50 y.o. Admit date: 02/26/2012 D/C date:     03/13/2012   Primary Discharge Diagnoses:  1) Nausea/Vomiting  2) Chronic Systolic HF               -EF 40-98%  3) NICM               - s/p LVAD 08/2011  4) Transaminitis  Hospital Course:  [name] is a 51 y/o [place] male with a history of severe CHF due to NICM EF 10-15%, and severe MR previously on home milrinone. He had a HM II LVAD placed at [facility] March 2013. His medical history also consists of CRI, NSVT s/p ICD and LV thrombus.  He was recently seen at [facility] for follow up of his LVAD.  Labs showed elevation of his LFTs, CR, and LDH. His diuretics were held and Amiodarone stopped. His INR was also low and he was started on outpatient lovenox.  He was seen for follow up in the Heart Failure Clinic 02/26/12 where he presented with N/V for 3 days along with persistent cough and nasal congestion. Labs were AST - 213, ALT-262, and had + ketones, protein, and urobilinogen in his urine, along with an INR of 1.68. He was a direct admit from clinic to a telemetry bed for work-up.  He was started on IV heparin for bridge, and his INR now is therapeutic at 2.2. Moxifloxacin was initiated for possible upper respiratory infection, and a CXR showed no findings of pneumonia. Patient continued to have persistent cough and will continue on above antibiotic for total 7 days.   During his stay he had a CT scan of the abdomen and pelvis, which showed transient proximal intussusception, and general surgery was consulted. A small bowel study was ordered, with no significant findings, see results below.   The patients LDH and LFTs continued to trend down and his hepatitis panels and HIV antibody were all negative. Blood cultures at this time are negative and urine culture and haptoglobin are still in process. Will follow up on labs.  The LVAD has been  functioning within specified parameters, with no alarms or significant power spikes.   [name] this am reports having a BM, and denies any abdominal pain or N/V. He reports "feeling good" and is ready to go home. He is tolerating his diet and will follow up in clinic next week.  He will be discharged in stable condition.   Discharge Weight Range: 158-162 lbs Discharge Vitals: Blood pressure 86/1, pulse 88, temperature 98.8 F (37.1 C), temperature source Oral, resp. rate 20, height 5\' 4"  (1.626 m), weight 72.802 kg (160 lb 8 oz), SpO2 93.00%.  Labs: Lab Results  Component Value Date   WBC 4.6 02/28/2012   HGB 11.8* 02/28/2012   HCT 35.7* 02/28/2012   MCV 78.1 02/28/2012   PLT 338 02/28/2012   No results found for this basename: NA,K,CL,CO2,BUN,CREATININE,CALCIUM,LABALBU,PROT,BILITOT,ALKPHOS,ALT,AST,GLUCOSE in the last 168 hours Lab Results  Component Value Date   CHOL 222* 03/10/2009   HDL 111.00 03/10/2009   LDLCALC  Value: 95        Total Cholesterol/HDL:CHD Risk Coronary Heart Disease Risk Table                     Men   Women  1/2 Average Risk   3.4   3.3 06/22/2008   TRIG 79.0 03/10/2009  BNP (last 3 results)  Basename 01/14/12 1056 09/08/11 2235 07/06/11 1743  PROBNP 127.0* 5981.0* 5088.0*    Diagnostic Studies/Procedures   No results found.  Discharge Medications     Medication List     As of 03/13/2012  2:46 PM    TAKE these medications         allopurinol 100 MG tablet   Commonly known as: ZYLOPRIM   Take 100 mg by mouth as needed. For gout      aspirin EC 81 MG tablet   Take 81 mg by mouth daily.      carvedilol 25 MG tablet   Commonly known as: COREG   Take 1 tablet (25 mg total) by mouth 2 (two) times daily with a meal.      ferrous gluconate 324 MG tablet   Commonly known as: FERGON   Take 324 mg by mouth 3 (three) times daily with meals.      gabapentin 300 MG capsule   Commonly known as: NEURONTIN   Take 1 capsule (300 mg total) by mouth 3 (three)  times daily.      hydrALAZINE 50 MG tablet   Commonly known as: APRESOLINE   Take 1.5 tablets (75 mg total) by mouth 3 (three) times daily.      lisinopril 2.5 MG tablet   Commonly known as: PRINIVIL,ZESTRIL   Take 2.5 mg by mouth daily.      MAG-OX 400 PO   Take 800 mg by mouth 2 (two) times daily as needed.      ranitidine 150 MG tablet   Commonly known as: ZANTAC   Take 150 mg by mouth 2 (two) times daily.      warfarin 3 MG tablet   Commonly known as: COUMADIN   Take 1.5 tablets (4.5 mg total) by mouth daily.         Disposition   The patient will be discharged in stable condition to home. Discharge Orders    Future Appointments: Provider: Department: Dept Phone: Center:   03/19/2012 9:00 AM [facility] [phone] None   04/21/2012 2:00 PM Corwin Levins, MD [facility] [phone] [facility]     Future Orders Please Complete By Expires   Diet - low sodium heart healthy      Increase activity slowly      Call MD for:  temperature >100.4      Call MD for:  persistant nausea and vomiting      Call MD for:  redness, tenderness, or signs of infection (pain, swelling, redness, odor or green/yellow discharge around incision site)      ACE Inhibitor / ARB already ordered      ACE Inhibitor / ARB already ordered        Follow-up Information    Follow up with Arvilla Meres, MD on 03/04/2012. (@ 10:45 am (Gate code [phone]))    Contact information:   [address] [place] [address] [phone]            Duration of Discharge Encounter: Greater than 35 minutes   Patient seen and examined on day of discharge by Dr. Marca Ancona in my stead. I cared for [name] during the remainder of his stay and his hospital course is summarized above.   Migdalia Dk  03/13/2012, 2:46 PM

## 2012-03-19 ENCOUNTER — Ambulatory Visit (HOSPITAL_COMMUNITY)
Admission: RE | Admit: 2012-03-19 | Discharge: 2012-03-19 | Disposition: A | Discharge status: Home / Self Care | Payer: Medicare Other | Source: Ambulatory Visit | Attending: Internal Medicine | Admitting: Internal Medicine

## 2012-03-19 VITALS — BP 110/1 | Wt 167.2 lb

## 2012-03-19 DIAGNOSIS — Z95811 Presence of heart assist device: Secondary | ICD-10-CM

## 2012-03-19 DIAGNOSIS — F17200 Nicotine dependence, unspecified, uncomplicated: Secondary | ICD-10-CM

## 2012-03-19 DIAGNOSIS — I10 Essential (primary) hypertension: Secondary | ICD-10-CM

## 2012-03-19 DIAGNOSIS — I5022 Chronic systolic (congestive) heart failure: Secondary | ICD-10-CM

## 2012-03-19 DIAGNOSIS — R059 Cough, unspecified: Secondary | ICD-10-CM

## 2012-03-19 DIAGNOSIS — R05 Cough: Secondary | ICD-10-CM

## 2012-03-19 LAB — COMPREHENSIVE METABOLIC PANEL
AST: 105 U/L — ABNORMAL HIGH (ref 0–37)
Albumin: 3.6 g/dL (ref 3.5–5.2)
BUN: 9 mg/dL (ref 6–23)
Calcium: 9.6 mg/dL (ref 8.4–10.5)
Creatinine, Ser: 0.96 mg/dL (ref 0.50–1.35)

## 2012-03-19 LAB — LIPID PANEL
Cholesterol: 257 mg/dL — ABNORMAL HIGH (ref 0–200)
HDL: 43 mg/dL (ref >39)
LDL Cholesterol: 193 mg/dL — ABNORMAL HIGH (ref 0–99)
Total CHOL/HDL Ratio: 6 RATIO
Triglycerides: 103 mg/dL (ref <150)
VLDL: 21 mg/dL (ref 0–40)

## 2012-03-19 LAB — PROTIME-INR: INR: 1.72 — ABNORMAL HIGH (ref 0.00–1.49)

## 2012-03-19 MED ORDER — HYDRALAZINE HCL 50 MG PO TABS: 75.0000 mg | ORAL_TABLET | Freq: Three times a day (TID) | ORAL | Status: DC

## 2012-03-19 NOTE — Assessment & Plan Note (Addendum)
Patient's cough much improved. Suggested getting some OTC allergy medicine.

## 2012-03-19 NOTE — Patient Instructions (Addendum)
Doing a great job!!!  Increase hydralazine to 75 mg (1.5 tablets) 3x a day.  Take all other medications as prescribed.  Will call with lab results. Follow up with [place] after [facility] appointment.  Do the following things EVERYDAY: 1) Weigh yourself in the morning before breakfast. Write it down and keep it in a log. 2) Take your medicines as prescribed 3) Eat low salt foods-Limit salt (sodium) to 2000 mg per day.  4) Stay as active as you can everyday 5) Limit all fluids for the day to less than 2 liters

## 2012-03-19 NOTE — Assessment & Plan Note (Addendum)
Patient reports he has not smoked since Feb. 2013. Will check cotinine level today in pursuit of possible transplant.

## 2012-03-19 NOTE — Assessment & Plan Note (Addendum)
NYHA II. Volume status good. Patient doing well and reports taking medications as prescribed. INR has been therapeutic. Haptoglobin was low last visit, will recheck today along with INR, CMET, CBC, and LDH. Will fax results to [facility]. Patient was slightly confused about his [facility] medication list and ours so we went back over with patient medication list and wrote on his medication bottles. MAP 110 by doppler, so will increase hydralazine to 75 mg TID. Education done with patient on normal parameters of pump and to call if he notices power spikes or increase of 2 or more of power. Follow up with [facility] 10/16 and then will follow up with [place] 6 weeks after. He is very interested in being considered for transplant - we discussed what that might take. LVAD diagnostics interrogated personally and look fine.

## 2012-03-21 DIAGNOSIS — I10 Essential (primary) hypertension: Secondary | ICD-10-CM | POA: Insufficient documentation

## 2012-03-21 NOTE — Progress Notes (Signed)
HPI: [name] is 51 y/o [place] American male with a history of severe CHF due to NICM EF 10-15%, and severe MR previously on home milrinone. He had a HM II LVAD placed at [facility] March 2013. His medical history also consists of CRI, NSVT s/p ICD and LV thrombus.   Recently seen at [facility] and LFTs were mildly elevated and CR up slightly. Also INR low. Diuretics held and place on lovenox. Admitted to the hospital  02/26/12 from clinic for N/V and persistent cough.  02/28/12: D/cd from hospital for N/V and persistent cough.  LFTs were elevated and haptoglobin low. CXR negative and was started on antibiotics, which he has since finished.  Follow up: Patient reports feeling better, however still has minimal cough. Walking and getting around well. Taking medications as prescribed. States he reports he is managing his coumadin better. No SOB/orthopnea/or edema. Reports he is having some trouble sleeping.   Denies LVAD alarms.  Denies driveline trauma, erythema or drainage.  Denies ICD shocks.   Reports taking Coumadin as prescribed and adherence to anticoagulation based dietary restrictions.  Denies bright red blood per rectum or melena, no dark urine or hematuria.    Past Medical History  Diagnosis Date  . CHF (congestive heart failure)     EF- 10-15  . Medically noncompliant   . Mitral regurgitation   . Tobacco user   . HTN (hypertension)   . AICD (automatic cardioverter/defibrillator) present   . GERD (gastroesophageal reflux disease)   . Substance abuse   . Chronic renal insufficiency   . Syncope   . Thrombus 08/06/2010  . SYSTOLIC HEART FAILURE, CHRONIC 09/22/2008    Qualifier: Diagnosis of  By: Gala Romney, MD, Trixie Dredge   . LV (left ventricular) mural thrombus 01/28/2011  . ICD - IN SITU 09/16/2008    Qualifier: Diagnosis of  By: Wonda Amis    . MITRAL STENOSIS/ INSUFFICIENCY, NON-RHEUMATIC 09/22/2008    Qualifier: Diagnosis of  By: Gala Romney, MD, Trixie Dredge Hepatomegaly  09/16/2008    Qualifier: Diagnosis of  By: Wonda Amis    . High cholesterol 02/26/2012    "at one time"  . Sleep apnea   . Exertional dyspnea 02/26/2012  . History of blood transfusion 08/2011    "when I had heart pump"  . Migraines   . COMMON MIGRAINE 06/14/2009    Qualifier: Diagnosis of  By: Jonny Ruiz MD, Len Blalock   . History of gout 02/26/2012  . Depression   . Bipolar affective disorder 10/22/2011    pt denies this hx 02/26/2012    Current Outpatient Prescriptions  Medication Sig Dispense Refill  . allopurinol (ZYLOPRIM) 100 MG tablet Take 100 mg by mouth as needed. For gout      . aspirin EC 81 MG tablet Take 81 mg by mouth daily.      . carvedilol (COREG) 25 MG tablet Take 1 tablet (25 mg total) by mouth 2 (two) times daily with a meal.  60 tablet  6  . ferrous gluconate (FERGON) 324 MG tablet Take 324 mg by mouth 3 (three) times daily with meals.      . gabapentin (NEURONTIN) 300 MG capsule Take 1 capsule (300 mg total) by mouth 3 (three) times daily.  90 capsule  6  . hydrALAZINE (APRESOLINE) 50 MG tablet Take 1.5 tablets (75 mg total) by mouth 3 (three) times daily.  45 tablet  6  . lisinopril (PRINIVIL,ZESTRIL) 5 MG tablet Take 5 mg  by mouth daily.      . Magnesium Oxide (MAG-OX 400 PO) Take 800 mg by mouth 2 (two) times daily.       . ranitidine (ZANTAC) 150 MG tablet Take 150 mg by mouth 2 (two) times daily as needed.       . torsemide (DEMADEX) 20 MG tablet Take 20 mg by mouth as directed.      . warfarin (COUMADIN) 3 MG tablet Take 1.5 tablets (4.5 mg total) by mouth daily.  30 tablet  3  . DISCONTD: isosorbide mononitrate (IMDUR) 30 MG 24 hr tablet Take 1 tablet (30 mg total) by mouth daily.  60 tablet  6    Lexapro  REVIEW OF SYSTEMS: All systems negative except as listed in HPI, PMH and Problem list.   LVAD INTERROGATION:   HeartMate II LVAD:  Flow 5.0 liters/min, speed 9200, power 6.2, PI 5.8.    I reviewed the LVAD parameters from today, and compared the  results to the patient's prior recorded data.  No programming changes were made.  The LVAD is functioning within specified parameters.  The patient performs LVAD self-test daily.  LVAD interrogation non-sustained PI events.  LVAD equipment check completed and is in good working order.  Back-up equipment present.   LVAD education done on emergency procedures and precautions and reviewed exit site care.   Physical Exam: Filed Vitals:   03/19/12 0931  BP: 110/1  Weight: 167 lb 4 oz (75.864 kg)    GENERAL: Well appearing, male who presents to clinic today in no acute distress. HEENT: normal  NECK: Supple, JVP flat.  2+ bilaterally, no bruits.  No lymphadenopathy or thyromegaly appreciated.   CARDIAC:  Mechanical heart sounds with LVAD hum present.  LUNGS:  Clear to auscultation bilaterally.  ABDOMEN:  Soft, round, nontender, positive bowel sounds x4.     LVAD exit site: well-healed and incorporated.  Dressing dry and intact with silver. Stabilization device present and accurately applied.  Driveline dressing is being changed daily per sterile technique. EXTREMITIES:  Warm and dry, no cyanosis, clubbing, rash or edema  NEUROLOGIC:  Alert and oriented x 4.  Gait steady.  No aphasia.  No dysarthria.  Affect pleasant.     ASSESSMENT AND PLAN:       1.  Anticoagulation management - INR goal 2.0-2.5.  Will evaluate INR value today and follow up with patient for necessary changes.

## 2012-03-21 NOTE — Addendum Note (Signed)
Encounter addended by: Dolores Patty, MD on: 03/21/2012  3:15 PM<BR>     Documentation filed: Charges VN

## 2012-03-21 NOTE — Assessment & Plan Note (Signed)
BP elevated. Will increase hydralazine. Of note, his anti-histone Abs were elevated recently and need to watch for lupus-like reaction.

## 2012-03-31 ENCOUNTER — Ambulatory Visit (INDEPENDENT_AMBULATORY_CARE_PROVIDER_SITE_OTHER): Payer: Medicare Other | Admitting: *Deleted

## 2012-03-31 DIAGNOSIS — I8290 Acute embolism and thrombosis of unspecified vein: Secondary | ICD-10-CM

## 2012-03-31 DIAGNOSIS — I749 Embolism and thrombosis of unspecified artery: Secondary | ICD-10-CM

## 2012-04-14 ENCOUNTER — Telehealth (HOSPITAL_COMMUNITY): Payer: Self-pay | Admitting: Cardiology

## 2012-04-14 ENCOUNTER — Ambulatory Visit (INDEPENDENT_AMBULATORY_CARE_PROVIDER_SITE_OTHER): Payer: Medicare Other | Admitting: *Deleted

## 2012-04-14 DIAGNOSIS — I749 Embolism and thrombosis of unspecified artery: Secondary | ICD-10-CM

## 2012-04-14 DIAGNOSIS — I8290 Acute embolism and thrombosis of unspecified vein: Secondary | ICD-10-CM

## 2012-04-14 DIAGNOSIS — I5022 Chronic systolic (congestive) heart failure: Secondary | ICD-10-CM

## 2012-04-14 LAB — PROTIME-INR
INR: 2.7 ratio — ABNORMAL HIGH (ref 0.8–1.0)
Prothrombin Time: 27.9 s — ABNORMAL HIGH (ref 10.2–12.4)

## 2012-04-14 NOTE — Telephone Encounter (Signed)
Orders placed for labs

## 2012-04-15 ENCOUNTER — Other Ambulatory Visit: Payer: Medicare Other

## 2012-04-21 ENCOUNTER — Ambulatory Visit: Payer: Medicare Other | Admitting: Internal Medicine

## 2012-04-21 DIAGNOSIS — Z0289 Encounter for other administrative examinations: Secondary | ICD-10-CM

## 2012-05-13 ENCOUNTER — Encounter (INDEPENDENT_AMBULATORY_CARE_PROVIDER_SITE_OTHER): Payer: Medicare Other | Admitting: Pharmacist

## 2012-05-13 DIAGNOSIS — I8290 Acute embolism and thrombosis of unspecified vein: Secondary | ICD-10-CM

## 2012-05-13 DIAGNOSIS — I749 Embolism and thrombosis of unspecified artery: Secondary | ICD-10-CM

## 2012-05-13 LAB — PROTIME-INR
INR: 2.2 ratio — ABNORMAL HIGH (ref 0.8–1.0)
Prothrombin Time: 22.8 s — ABNORMAL HIGH (ref 10.2–12.4)

## 2012-05-13 LAB — APTT: aPTT: 37.1 s — ABNORMAL HIGH (ref 21.7–28.8)

## 2012-05-13 NOTE — Progress Notes (Signed)
This encounter was created in error - please disregard.

## 2012-05-26 ENCOUNTER — Ambulatory Visit (INDEPENDENT_AMBULATORY_CARE_PROVIDER_SITE_OTHER): Payer: Medicare Other | Admitting: *Deleted

## 2012-05-26 DIAGNOSIS — I059 Rheumatic mitral valve disease, unspecified: Secondary | ICD-10-CM

## 2012-05-26 DIAGNOSIS — I5022 Chronic systolic (congestive) heart failure: Secondary | ICD-10-CM

## 2012-05-26 DIAGNOSIS — I509 Heart failure, unspecified: Secondary | ICD-10-CM

## 2012-05-26 LAB — PROTIME-INR: Prothrombin Time: 20.6 s — ABNORMAL HIGH (ref 10.2–12.4)

## 2012-06-09 ENCOUNTER — Other Ambulatory Visit (INDEPENDENT_AMBULATORY_CARE_PROVIDER_SITE_OTHER): Payer: Medicare Other

## 2012-06-09 DIAGNOSIS — I509 Heart failure, unspecified: Secondary | ICD-10-CM

## 2012-06-09 DIAGNOSIS — I059 Rheumatic mitral valve disease, unspecified: Secondary | ICD-10-CM

## 2012-06-09 DIAGNOSIS — I5022 Chronic systolic (congestive) heart failure: Secondary | ICD-10-CM

## 2012-06-11 ENCOUNTER — Ambulatory Visit (HOSPITAL_COMMUNITY): Payer: Medicare Other

## 2012-07-08 ENCOUNTER — Encounter (HOSPITAL_COMMUNITY): Payer: Self-pay

## 2012-07-08 ENCOUNTER — Telehealth (HOSPITAL_COMMUNITY): Payer: Self-pay | Admitting: Anesthesiology

## 2012-07-08 ENCOUNTER — Other Ambulatory Visit (INDEPENDENT_AMBULATORY_CARE_PROVIDER_SITE_OTHER): Payer: Medicare Other

## 2012-07-08 ENCOUNTER — Ambulatory Visit (HOSPITAL_COMMUNITY)
Admission: RE | Admit: 2012-07-08 | Discharge: 2012-07-08 | Disposition: A | Discharge status: Home / Self Care | Payer: Medicare Other | Source: Ambulatory Visit | Attending: Internal Medicine | Admitting: Internal Medicine

## 2012-07-08 VITALS — BP 132/82 | HR 72 | Wt 181.2 lb

## 2012-07-08 DIAGNOSIS — Z95818 Presence of other cardiac implants and grafts: Secondary | ICD-10-CM | POA: Insufficient documentation

## 2012-07-08 DIAGNOSIS — I509 Heart failure, unspecified: Secondary | ICD-10-CM

## 2012-07-08 DIAGNOSIS — I059 Rheumatic mitral valve disease, unspecified: Secondary | ICD-10-CM

## 2012-07-08 DIAGNOSIS — I5022 Chronic systolic (congestive) heart failure: Secondary | ICD-10-CM

## 2012-07-08 DIAGNOSIS — Z95811 Presence of heart assist device: Secondary | ICD-10-CM

## 2012-07-08 DIAGNOSIS — F17200 Nicotine dependence, unspecified, uncomplicated: Secondary | ICD-10-CM

## 2012-07-08 DIAGNOSIS — I10 Essential (primary) hypertension: Secondary | ICD-10-CM

## 2012-07-08 LAB — BASIC METABOLIC PANEL
BUN: 11 mg/dL (ref 6–23)
CO2: 26 mEq/L (ref 19–32)
Chloride: 103 mEq/L (ref 96–112)
Creatinine, Ser: 1.06 mg/dL (ref 0.50–1.35)
GFR calc Af Amer: >90 mL/min (ref >90)
Potassium: 5.2 mEq/L — ABNORMAL HIGH (ref 3.5–5.1)

## 2012-07-08 LAB — LACTATE DEHYDROGENASE: LDH: 471 U/L — ABNORMAL HIGH (ref 94–250)

## 2012-07-08 LAB — CBC
HCT: 35.6 % — ABNORMAL LOW (ref 39.0–52.0)
Hemoglobin: 11.7 g/dL — ABNORMAL LOW (ref 13.0–17.0)
MCV: 78.9 fL (ref 78.0–100.0)
RBC: 4.51 MIL/uL (ref 4.22–5.81)
RDW: 17.5 % — ABNORMAL HIGH (ref 11.5–15.5)
WBC: 3.5 10*3/uL — ABNORMAL LOW (ref 4.0–10.5)

## 2012-07-08 MED ORDER — TORSEMIDE 20 MG PO TABS: 20.0000 mg | ORAL_TABLET | ORAL | Status: DC

## 2012-07-08 MED ORDER — SPIRONOLACTONE 25 MG PO TABS: 12.5000 mg | ORAL_TABLET | Freq: Every day | ORAL | Status: DC

## 2012-07-08 NOTE — Telephone Encounter (Signed)
Called patient and asked him not to pick up his Cleda Daub since his potassium is elevated and asked him to start taking demadex 20 mg 2x weekly.

## 2012-07-08 NOTE — Assessment & Plan Note (Addendum)
Continues to abstain from cigarettes.  

## 2012-07-08 NOTE — Assessment & Plan Note (Addendum)
Mildly elevated in setting of not taking meds prior to clinic. Demadex should help. Can titrate anti-HTN meds as needed.

## 2012-07-08 NOTE — Assessment & Plan Note (Addendum)
Doing very well s/p VAD. Functional status much improved. No bleeding, neuro sx or signs of infection. Volume status is elevated. Will restart demadex and have him take 20mg  two times per week with K+. (Had considered adding spiro 12.5 daily instead but K+ 5.4 today).  LVAD interrogation looks great and minimal PI events. Has f/u at [facility] for transplant eval. Will follow up with [place] in 2 months.

## 2012-07-08 NOTE — Patient Instructions (Addendum)
Follow up in 2 months.  Take 20 mg torsemide today.  Start Spironolactone 12.5 mg daily.  Check BMET next week at [facility].

## 2012-07-08 NOTE — Progress Notes (Signed)
HPI: [name] is 52 y/o [place] male with a history of severe CHF due to NICM EF 10-15%, and severe MR previously on home milrinone. He had a HM II LVAD placed at [facility] March 2013. His medical history also consists of CRI, NSVT s/p ICD and LV thrombus.   Recently seen at [facility] and LFTs were mildly elevated and CR up slightly. Also INR low. Diuretics held and place on lovenox. Admitted to the hospital 02/26/12 from clinic for N/V and persistent cough and LFTs elevated and haptoglobin low.  Follow up: Patient reports he is doing well and looking for a job. Denies SOB/CP/orthopnea. He does report he has gained some weight over the past couple of months and has trace edema occasionally. Taking medications as prescribed. Diuretics stopped previously. Reports his driveline looks good with no drainage or tenderness.  Denies LVAD alarms.  Denies driveline trauma, erythema or drainage.  Denies ICD shocks.   Reports taking Coumadin as prescribed and adherence to anticoagulation based dietary restrictions.  Denies bright red blood per rectum or melena, no dark urine or hematuria.    Past Medical History  Diagnosis Date  . CHF (congestive heart failure)     EF- 10-15  . Medically noncompliant   . Mitral regurgitation   . Tobacco user   . HTN (hypertension)   . AICD (automatic cardioverter/defibrillator) present   . GERD (gastroesophageal reflux disease)   . Substance abuse   . Chronic renal insufficiency   . Syncope   . Thrombus 08/06/2010  . SYSTOLIC HEART FAILURE, CHRONIC 09/22/2008    Qualifier: Diagnosis of  By: Gala Romney, MD, Trixie Dredge   . LV (left ventricular) mural thrombus 01/28/2011  . ICD - IN SITU 09/16/2008    Qualifier: Diagnosis of  By: Wonda Amis    . MITRAL STENOSIS/ INSUFFICIENCY, NON-RHEUMATIC 09/22/2008    Qualifier: Diagnosis of  By: Gala Romney, MD, Trixie Dredge Hepatomegaly 09/16/2008    Qualifier: Diagnosis of  By: Wonda Amis    . High cholesterol  02/26/2012    "at one time"  . Sleep apnea   . Exertional dyspnea 02/26/2012  . History of blood transfusion 08/2011    "when I had heart pump"  . Migraines   . COMMON MIGRAINE 06/14/2009    Qualifier: Diagnosis of  By: Jonny Ruiz MD, Len Blalock   . History of gout 02/26/2012  . Depression   . Bipolar affective disorder 10/22/2011    pt denies this hx 02/26/2012    Current Outpatient Prescriptions  Medication Sig Dispense Refill  . allopurinol (ZYLOPRIM) 100 MG tablet Take 100 mg by mouth as needed. For gout      . aspirin EC 81 MG tablet Take 81 mg by mouth daily.      . carvedilol (COREG) 25 MG tablet Take 1 tablet (25 mg total) by mouth 2 (two) times daily with a meal.  60 tablet  6  . ferrous gluconate (FERGON) 324 MG tablet Take 324 mg by mouth daily with breakfast.       . gabapentin (NEURONTIN) 300 MG capsule Take 1 capsule (300 mg total) by mouth 3 (three) times daily.  90 capsule  6  . hydrALAZINE (APRESOLINE) 50 MG tablet Take 1.5 tablets (75 mg total) by mouth 3 (three) times daily.  45 tablet  6  . losartan (COZAAR) 50 MG tablet Take 50 mg by mouth daily.      . Magnesium Oxide (MAG-OX 400 PO) Take  800 mg by mouth 2 (two) times daily.       . ranitidine (ZANTAC) 150 MG tablet Take 150 mg by mouth 2 (two) times daily as needed.       . warfarin (COUMADIN) 3 MG tablet Take 1.5 tablets (4.5 mg total) by mouth daily.  30 tablet  3  . torsemide (DEMADEX) 20 MG tablet Take 1 tablet (20 mg total) by mouth as directed.  30 tablet  3  . [DISCONTINUED] isosorbide mononitrate (IMDUR) 30 MG 24 hr tablet Take 1 tablet (30 mg total) by mouth daily.  60 tablet  6    Lexapro  REVIEW OF SYSTEMS: All systems negative except as listed in HPI, PMH and Problem list.   LVAD INTERROGATION:   HeartMate II LVAD:  Flow 5.3 liters/min, speed 9200, power 6.2, PI 5.8.    I reviewed the LVAD parameters from today, and compared the results to the patient's prior recorded data.  No programming changes were made.   The LVAD is functioning within specified parameters.  The patient performs LVAD self-test daily.  LVAD interrogation few power spikes, however non-sustained. Multiple PI events most likely related to cough.  LVAD equipment check completed and is in good working order.  Back-up equipment present.   LVAD education done on emergency procedures and precautions and reviewed exit site care.   Physical Exam: Filed Vitals:   07/08/12 1052  BP: 132/82  Pulse: 72  Weight: 181 lb 4 oz (82.214 kg)  SpO2: 100%    GENERAL: Well appearing, male who presents to clinic today in no acute distress. HEENT: normal  NECK: Supple, JVP 8-9.  2+ bilaterally, no bruits.  No lymphadenopathy or thyromegaly appreciated.   CARDIAC:  Mechanical heart sounds with LVAD hum present.  LUNGS:  Clear to auscultation bilaterally.  ABDOMEN:  Soft, round, nontender, positive bowel sounds x4.     LVAD exit site: well-healed and incorporated.  Dressing dry and intact. Stabilization device present and accurately applied.  Driveline dressing is being changed daily per sterile technique. EXTREMITIES:  Warm and dry, no cyanosis, clubbing, rash or 2+ edema  NEUROLOGIC:  Alert and oriented x 4.  Gait steady.  No aphasia.  No dysarthria.  Affect pleasant.     ASSESSMENT AND PLAN:   1. Status post LVAD implantation - Patient comes to clinic today for routine follow up visit. Currently has NYHA class II symptoms on LVAD support. Plan to follow up at next routine visit.      2.  Anticoagulation management - INR goal 2.0-2.5.  Will evaluate INR value today and follow up with patient for necessary changes.

## 2012-07-09 ENCOUNTER — Encounter (HOSPITAL_COMMUNITY): Payer: Self-pay | Admitting: Anesthesiology

## 2012-07-09 NOTE — Progress Notes (Signed)
Faxed note to [facility] from last visit 07/08/12. Faxed to [employer].

## 2012-07-28 ENCOUNTER — Inpatient Hospital Stay (HOSPITAL_COMMUNITY)
Admission: EM | Admit: 2012-07-28 | Discharge: 2012-08-08 | DRG: 315 | Disposition: A | Discharge status: Home / Self Care | Payer: Medicare Other | Attending: Internal Medicine | Admitting: Internal Medicine

## 2012-07-28 ENCOUNTER — Encounter (HOSPITAL_COMMUNITY): Payer: Self-pay | Admitting: Emergency Medicine

## 2012-07-28 DIAGNOSIS — I5022 Chronic systolic (congestive) heart failure: Secondary | ICD-10-CM

## 2012-07-28 DIAGNOSIS — I472 Ventricular tachycardia, unspecified: Secondary | ICD-10-CM | POA: Diagnosis present

## 2012-07-28 DIAGNOSIS — Z7982 Long term (current) use of aspirin: Secondary | ICD-10-CM

## 2012-07-28 DIAGNOSIS — R570 Cardiogenic shock: Secondary | ICD-10-CM

## 2012-07-28 DIAGNOSIS — I509 Heart failure, unspecified: Secondary | ICD-10-CM | POA: Diagnosis present

## 2012-07-28 DIAGNOSIS — I4729 Other ventricular tachycardia: Secondary | ICD-10-CM | POA: Diagnosis present

## 2012-07-28 DIAGNOSIS — Z87891 Personal history of nicotine dependence: Secondary | ICD-10-CM

## 2012-07-28 DIAGNOSIS — Z79899 Other long term (current) drug therapy: Secondary | ICD-10-CM

## 2012-07-28 DIAGNOSIS — D596 Hemoglobinuria due to hemolysis from other external causes: Secondary | ICD-10-CM | POA: Diagnosis present

## 2012-07-28 DIAGNOSIS — Y849 Medical procedure, unspecified as the cause of abnormal reaction of the patient, or of later complication, without mention of misadventure at the time of the procedure: Secondary | ICD-10-CM | POA: Diagnosis present

## 2012-07-28 DIAGNOSIS — T829XXA Unspecified complication of cardiac and vascular prosthetic device, implant and graft, initial encounter: Secondary | ICD-10-CM

## 2012-07-28 DIAGNOSIS — Z95810 Presence of automatic (implantable) cardiac defibrillator: Secondary | ICD-10-CM

## 2012-07-28 DIAGNOSIS — K219 Gastro-esophageal reflux disease without esophagitis: Secondary | ICD-10-CM | POA: Diagnosis present

## 2012-07-28 DIAGNOSIS — I10 Essential (primary) hypertension: Secondary | ICD-10-CM

## 2012-07-28 DIAGNOSIS — I428 Other cardiomyopathies: Secondary | ICD-10-CM | POA: Diagnosis present

## 2012-07-28 DIAGNOSIS — IMO0002 Reserved for concepts with insufficient information to code with codable children: Secondary | ICD-10-CM

## 2012-07-28 DIAGNOSIS — Z95818 Presence of other cardiac implants and grafts: Secondary | ICD-10-CM

## 2012-07-28 DIAGNOSIS — Z95811 Presence of heart assist device: Secondary | ICD-10-CM

## 2012-07-28 DIAGNOSIS — T82898A Other specified complication of vascular prosthetic devices, implants and grafts, initial encounter: Principal | ICD-10-CM | POA: Diagnosis present

## 2012-07-28 LAB — HAPTOGLOBIN: Haptoglobin: <25 mg/dL — ABNORMAL LOW (ref 45–215)

## 2012-07-28 LAB — CBC
HCT: 39.8 % (ref 39.0–52.0)
Hemoglobin: 13.4 g/dL (ref 13.0–17.0)
MCHC: 33.7 g/dL (ref 30.0–36.0)
RDW: 17.7 % — ABNORMAL HIGH (ref 11.5–15.5)
WBC: 4 10*3/uL (ref 4.0–10.5)

## 2012-07-28 LAB — MAGNESIUM: Magnesium: 2.1 mg/dL (ref 1.5–2.5)

## 2012-07-28 LAB — URINALYSIS, ROUTINE W REFLEX MICROSCOPIC
Glucose, UA: NEGATIVE mg/dL
Protein, ur: >300 mg/dL — AB
Specific Gravity, Urine: 1.027 (ref 1.005–1.030)
Urobilinogen, UA: 0.2 mg/dL (ref 0.0–1.0)

## 2012-07-28 LAB — PROTIME-INR: INR: 1.2 (ref 0.00–1.49)

## 2012-07-28 LAB — URINE MICROSCOPIC-ADD ON

## 2012-07-28 LAB — BASIC METABOLIC PANEL
BUN: 21 mg/dL (ref 6–23)
Calcium: 9.8 mg/dL (ref 8.4–10.5)
Chloride: 99 mEq/L (ref 96–112)
Creatinine, Ser: 1.27 mg/dL (ref 0.50–1.35)
GFR calc Af Amer: 75 mL/min — ABNORMAL LOW (ref >90)
GFR calc non Af Amer: 65 mL/min — ABNORMAL LOW (ref >90)

## 2012-07-28 LAB — HEPATIC FUNCTION PANEL
ALT: 16 U/L (ref 0–53)
AST: 55 U/L — ABNORMAL HIGH (ref 0–37)
Albumin: 4 g/dL (ref 3.5–5.2)
Total Bilirubin: 1.9 mg/dL — ABNORMAL HIGH (ref 0.3–1.2)

## 2012-07-28 LAB — LACTATE DEHYDROGENASE: LDH: 1234 U/L — ABNORMAL HIGH (ref 94–250)

## 2012-07-28 LAB — HEPARIN LEVEL (UNFRACTIONATED): Heparin Unfractionated: 0.74 IU/mL — ABNORMAL HIGH (ref 0.30–0.70)

## 2012-07-28 LAB — TYPE AND SCREEN

## 2012-07-28 MED ORDER — SODIUM CHLORIDE 0.9 % IV SOLN
INTRAVENOUS | Status: DC
  Administered 2012-07-28: 11:00:00 via INTRAVENOUS

## 2012-07-28 MED ORDER — WARFARIN - PHARMACIST DOSING INPATIENT: Freq: Every day | Status: DC

## 2012-07-28 MED ORDER — CARVEDILOL 25 MG PO TABS
25.0000 mg | ORAL_TABLET | Freq: Two times a day (BID) | ORAL | Status: DC
  Administered 2012-07-28 – 2012-08-08 (×22): 25 mg via ORAL
  Filled 2012-07-28 (×24): qty 1

## 2012-07-28 MED ORDER — CARVEDILOL 25 MG PO TABS
25.0000 mg | ORAL_TABLET | Freq: Once | ORAL | Status: AC
  Administered 2012-07-28: 25 mg via ORAL
  Filled 2012-07-28: qty 1

## 2012-07-28 MED ORDER — SODIUM CHLORIDE 0.9 % IV SOLN: INTRAVENOUS | Status: DC

## 2012-07-28 MED ORDER — DEXTROSE 5 % IV SOLN
1.0000 g | INTRAVENOUS | Status: AC
  Administered 2012-07-28 – 2012-07-30 (×3): 1 g via INTRAVENOUS
  Filled 2012-07-28 (×4): qty 10

## 2012-07-28 MED ORDER — HEPARIN (PORCINE) IN NACL 100-0.45 UNIT/ML-% IJ SOLN
1100.0000 [IU]/h | INTRAMUSCULAR | Status: DC
  Administered 2012-07-28: 1200 [IU]/h via INTRAVENOUS
  Administered 2012-07-29: 1100 [IU]/h via INTRAVENOUS
  Administered 2012-07-29: 1150 [IU]/h via INTRAVENOUS
  Filled 2012-07-28 (×3): qty 250

## 2012-07-28 MED ORDER — MAGNESIUM OXIDE 400 MG PO TABS: 800.0000 mg | ORAL_TABLET | Freq: Two times a day (BID) | ORAL | Status: DC

## 2012-07-28 MED ORDER — DOCUSATE SODIUM 100 MG PO CAPS
200.0000 mg | ORAL_CAPSULE | Freq: Every day | ORAL | Status: DC | PRN
  Filled 2012-07-28: qty 2

## 2012-07-28 MED ORDER — MAGNESIUM OXIDE 400 (241.3 MG) MG PO TABS
800.0000 mg | ORAL_TABLET | Freq: Two times a day (BID) | ORAL | Status: DC
  Administered 2012-07-28 – 2012-08-08 (×22): 800 mg via ORAL
  Filled 2012-07-28 (×24): qty 2

## 2012-07-28 MED ORDER — ASPIRIN EC 81 MG PO TBEC
81.0000 mg | DELAYED_RELEASE_TABLET | Freq: Every day | ORAL | Status: DC
  Administered 2012-07-28 – 2012-08-08 (×12): 81 mg via ORAL
  Filled 2012-07-28 (×12): qty 1

## 2012-07-28 MED ORDER — HEPARIN BOLUS VIA INFUSION
2000.0000 [IU] | Freq: Once | INTRAVENOUS | Status: AC
  Administered 2012-07-28: 2000 [IU] via INTRAVENOUS

## 2012-07-28 MED ORDER — HYDRALAZINE HCL 50 MG PO TABS
75.0000 mg | ORAL_TABLET | Freq: Three times a day (TID) | ORAL | Status: DC
  Administered 2012-07-28 – 2012-07-30 (×6): 75 mg via ORAL
  Filled 2012-07-28 (×9): qty 1

## 2012-07-28 MED ORDER — HEPARIN BOLUS VIA INFUSION
4000.0000 [IU] | Freq: Once | INTRAVENOUS | Status: AC
  Administered 2012-07-28: 4000 [IU] via INTRAVENOUS

## 2012-07-28 MED ORDER — HYDRALAZINE HCL 50 MG PO TABS
75.0000 mg | ORAL_TABLET | Freq: Once | ORAL | Status: AC
  Administered 2012-07-28: 75 mg via ORAL
  Filled 2012-07-28: qty 1

## 2012-07-28 MED ORDER — PANTOPRAZOLE SODIUM 40 MG PO TBEC
40.0000 mg | DELAYED_RELEASE_TABLET | Freq: Every day | ORAL | Status: DC
  Administered 2012-07-28 – 2012-08-08 (×12): 40 mg via ORAL
  Filled 2012-07-28 (×13): qty 1

## 2012-07-28 MED ORDER — FERROUS GLUCONATE 324 (38 FE) MG PO TABS
324.0000 mg | ORAL_TABLET | Freq: Every day | ORAL | Status: DC
  Administered 2012-07-29 – 2012-08-08 (×11): 324 mg via ORAL
  Filled 2012-07-28 (×12): qty 1

## 2012-07-28 MED ORDER — HEPARIN BOLUS VIA INFUSION: 6000.0000 [IU] | Freq: Once | INTRAVENOUS | Status: DC

## 2012-07-28 MED ORDER — WARFARIN SODIUM 5 MG PO TABS
5.0000 mg | ORAL_TABLET | Freq: Once | ORAL | Status: AC
  Administered 2012-07-28: 5 mg via ORAL
  Filled 2012-07-28: qty 1

## 2012-07-28 NOTE — ED Provider Notes (Signed)
Medical screening examination/treatment/procedure(s) were performed by non-physician practitioner and as supervising physician I was immediately available for consultation/collaboration.  On my exam the patient was in no distress.  We discussed case with cardiology, who facilitated the case nearly from the patient's arrival to the emergency department  I saw the ECG (if appropriate), relevant labs and studies - I agree with the interpretation.   Gerhard Munch, MD 07/28/12 1247

## 2012-07-28 NOTE — ED Notes (Signed)
VAD team and lab at bedside.

## 2012-07-28 NOTE — ED Notes (Signed)
ICD being interpreted right now.

## 2012-07-28 NOTE — Progress Notes (Signed)
ANTICOAGULATION CONSULT NOTE - Initial Consult  Pharmacy Consult for Heparin Indication: LVAD  Allergies  Allergen Reactions  . Lexapro (Escitalopram Oxalate) Other (See Comments)    somnolence    Patient Measurements: Weight: 162 lb (73.483 kg) Heparin Dosing Weight: 73 kg  Vital Signs: Temp: 97.7 F (36.5 C) (02/04 0907) Temp src: Oral (02/04 0907) Pulse Rate: 81  (02/04 1000)  Labs:  [facility] 07/28/12 0939  HGB 13.4  HCT 39.8  PLT 168  APTT --  LABPROT 15.0  INR 1.20  HEPARINUNFRC --  CREATININE --  CKTOTAL --  CKMB --  TROPONINI --    The CrCl is unknown because both a height and weight (above a minimum accepted value) are required for this calculation.   Medical History: Past Medical History  Diagnosis Date  . CHF (congestive heart failure)     EF- 10-15  . Medically noncompliant   . Mitral regurgitation   . Tobacco user   . HTN (hypertension)   . AICD (automatic cardioverter/defibrillator) present   . GERD (gastroesophageal reflux disease)   . Substance abuse   . Chronic renal insufficiency   . Syncope   . Thrombus 08/06/2010  . SYSTOLIC HEART FAILURE, CHRONIC 09/22/2008    Qualifier: Diagnosis of  By: Gala Romney, MD, Trixie Dredge   . LV (left ventricular) mural thrombus 01/28/2011  . ICD - IN SITU 09/16/2008    Qualifier: Diagnosis of  By: Wonda Amis    . MITRAL STENOSIS/ INSUFFICIENCY, NON-RHEUMATIC 09/22/2008    Qualifier: Diagnosis of  By: Gala Romney, MD, Trixie Dredge Hepatomegaly 09/16/2008    Qualifier: Diagnosis of  By: Wonda Amis    . High cholesterol 02/26/2012    "at one time"  . Sleep apnea   . Exertional dyspnea 02/26/2012  . History of blood transfusion 08/2011    "when I had heart pump"  . Migraines   . COMMON MIGRAINE 06/14/2009    Qualifier: Diagnosis of  By: Jonny Ruiz MD, Len Blalock   . History of gout 02/26/2012  . Depression   . Bipolar affective disorder 10/22/2011    pt denies this hx 02/26/2012     Medications:   (Not in a hospital admission)  Assessment: 52 yo M admitted 2/4 with cola colored urine, INR 1.2, concern for pump thrombosis.   Pharmacy consulted to start heparin.  Goal of Therapy:  Heparin level 0.3-0.7 units/ml Monitor platelets by anticoagulation protocol: Yes   Plan:  Give 4000 units bolus x 1 Start heparin infusion at 1200 units/hr Check anti-Xa level in 6 hours and daily while on heparin Continue to monitor H&H and platelets   Thank you for allowing pharmacy to be a part of this patients care team.  Lovenia Kim Pharm.D., BCPS Clinical Pharmacist 07/28/2012 10:30 AM Pager: [phone] Phone: [phone]

## 2012-07-28 NOTE — Progress Notes (Addendum)
ANTICOAGULATION CONSULT NOTE - Follow Up Consult  Pharmacy Consult for Heparin Indication: LVAD  Allergies  Allergen Reactions  . Lexapro (Escitalopram Oxalate) Other (See Comments)    somnolence    Patient Measurements: Weight: 162 lb (73.483 kg) Heparin Dosing Weight: 73 kg  Vital Signs: Temp: 97.7 F (36.5 C) (02/04 1543) Temp src: Oral (02/04 1543) Pulse Rate: 85  (02/04 1700)  Labs:  [facility] 07/28/12 1654 07/28/12 0939  HGB -- 13.4  HCT -- 39.8  PLT -- 168  APTT -- --  LABPROT -- 15.0  INR -- 1.20  HEPARINUNFRC 0.83* --  CREATININE -- 1.27  CKTOTAL -- --  CKMB -- --  TROPONINI -- --    The CrCl is unknown because both a height and weight (above a minimum accepted value) are required for this calculation.   Medications:  Infusions:    . sodium chloride 50 mL/hr at 07/28/12 1500  . heparin 1,200 Units/hr (07/28/12 1700)  . [DISCONTINUED] sodium chloride      Assessment: 52 y/o male on heparin for possible LVAD pump thrombosis likely due to subtherapeutic INR. Heparin level is supratherapeutic at 0.83 but patient received a 6000 unit IV bolus which may be contributing to this level. Would favor continuing this rate and rechecking a heparin level. He presented with blood in his urine but no bleeding is noted currently. RN notes no bleeding or problems with heparin infusion.  Goal of Therapy:  Heparin level 0.3-0.7 units/ml Monitor platelets by anticoagulation protocol: Yes   Plan:  -Continue heparin drip at 1200 units/hr -Heparin level in 6 hours -Daily heparin level and CBC while on heparin  [facility], [place].D., BCPS Clinical Pharmacist Pager: [phone] 07/28/2012 5:51 PM

## 2012-07-28 NOTE — H&P (Signed)
Advanced Heart Failure Team Consult Note  Primary Physician: Dr. Oliver Barre  Primary Cardiologist: Dr. Gala Romney  Reason for Consultation: Blood in the urine  HPI:    [name] is 52 y/o [place] American male with a history of severe CHF due to NICM EF 10-15%, and severe MR previously on home milrinone. He had a HM II LVAD placed at [name] March 2013. His medical history also consists of CRI, NSVT s/p ICD and LV thrombus.  [name] is followed closely by [facility] and the [facility]. In 02/2012 he was admitted to the hospital for persistent N/V, elevated LFTs, CR and low haptoglobin. He has been doing well outpatient and reports that his INR has been therapeutic and he has been taking his medications as prescribed. He denies missing any coumadin doses or taking any extra. He reports that yesterday he started noticing hematuria. He denies any dysuria, BRBPR, or melana He reports that he has been feeling well, however he has noticed that his PI has been running lower and that he has had more dizziness over the last couple of days.  His weight at home is 160-163lbs without the batteries. Denies SOB/orthopnea/edema. Denies LVAD alarms. Denies driveline trauma, erythema or drainage. Denies ICD shocks.  Reports taking Coumadin as prescribed and adherence to anticoagulation based dietary restrictions. Denies bright red blood per rectum or melena, no dark urine or hematuria.    Review of Systems: [y] = yes, [ ]  = no   General: Weight gain Klaus.Mock ]; Weight loss Klaus.Mock ]; Anorexia Klaus.Mock ]; Fatigue [ ] ; Fever [N ]; Chills [ ] ; Weakness [ ]   Cardiac: Chest pain/pressure [ ] ; Resting SOB [ ] ; Exertional SOB Klaus.Mock ]; Orthopnea [ N]; Pedal Edema [ Y]; Palpitations [ ] ; Syncope [ ] ; Presyncope [ ] ; Paroxysmal nocturnal dyspnea[ ]   Pulmonary: Cough [ ] ; Wheezing[ ] ; Hemoptysis[N ]; Sputum [ ] ; Snoring [ ]   GI: Vomiting[ ] ; Dysphagia[ ] ; Melena[N ]; Hematochezia [ ] ; Heartburn[ ] ; Abdominal pain [ ] ; Constipation [ ] ; Diarrhea  [ ] ; BRBPR [N]  GU: Hematuria[ Y]; Dysuria Klaus.Mock ]; Nocturia[ ]   Vascular: Pain in legs with walking [ ] ; Pain in feet with lying flat [ ] ; Non-healing sores [ ] ; Stroke [ ] ; TIA [ ] ; Slurred speech [ ] ;  Neuro: Headaches[N ]; Vertigo[ ] ; Seizures[ ] ; Paresthesias[ ] ;Blurred vision [ ] ; Diplopia [ ] ; Vision changes [ ]  Dizziness [Y] Ortho/Skin: Arthritis [ ] ; Joint pain [ ] ; Muscle pain [ ] ; Joint swelling [ ] ; Back Pain [ ] ; Rash [ ]   Psych: Depression[N ]; Anxiety[ ]   Heme: Bleeding problems [N ]; Clotting disorders Klaus.Mock ]; Anemia [ ]   Endocrine: Diabetes [ ] ; Thyroid dysfunction[ ]   Home Medications Prior to Admission medications   Medication Sig Start Date End Date Taking? Authorizing Provider  aspirin EC 81 MG tablet Take 81 mg by mouth daily.   Yes Historical Provider, MD  carvedilol (COREG) 25 MG tablet Take 1 tablet (25 mg total) by mouth 2 (two) times daily with a meal. 01/14/12  Yes Dolores Patty, MD  ferrous gluconate (FERGON) 324 MG tablet Take 324 mg by mouth daily with breakfast.    Yes Historical Provider, MD  gabapentin (NEURONTIN) 300 MG capsule Take 1 capsule (300 mg total) by mouth 3 (three) times daily. 01/14/12  Yes Dolores Patty, MD  hydrALAZINE (APRESOLINE) 50 MG tablet Take 1.5 tablets (75 mg total) by mouth 3 (three) times daily. 03/19/12  Yes [name],  NP  losartan (COZAAR) 50 MG tablet Take 50 mg by mouth daily.   Yes Historical Provider, MD  Magnesium Oxide (MAG-OX 400 PO) Take 800 mg by mouth 2 (two) times daily.    Yes Historical Provider, MD  ranitidine (ZANTAC) 150 MG tablet Take 150 mg by mouth 2 (two) times daily as needed.    Yes Historical Provider, MD  torsemide (DEMADEX) 20 MG tablet Take 1 tablet (20 mg total) by mouth as directed. 07/08/12 07/08/13 Yes [name], NP  warfarin (COUMADIN) 3 MG tablet Take 3 mg by mouth daily.   Yes Historical Provider, MD  allopurinol (ZYLOPRIM) 100 MG tablet Take 100 mg by mouth as needed. For gout    Historical  Provider, MD    Past Medical History: Past Medical History  Diagnosis Date  . CHF (congestive heart failure)     EF- 10-15  . Medically noncompliant   . Mitral regurgitation   . Tobacco user   . HTN (hypertension)   . AICD (automatic cardioverter/defibrillator) present   . GERD (gastroesophageal reflux disease)   . Substance abuse   . Chronic renal insufficiency   . Syncope   . Thrombus 08/06/2010  . SYSTOLIC HEART FAILURE, CHRONIC 09/22/2008    Qualifier: Diagnosis of  By: Gala Romney, MD, Trixie Dredge   . LV (left ventricular) mural thrombus 01/28/2011  . ICD - IN SITU 09/16/2008    Qualifier: Diagnosis of  By: Wonda Amis    . MITRAL STENOSIS/ INSUFFICIENCY, NON-RHEUMATIC 09/22/2008    Qualifier: Diagnosis of  By: Gala Romney, MD, Trixie Dredge Hepatomegaly 09/16/2008    Qualifier: Diagnosis of  By: Wonda Amis    . High cholesterol 02/26/2012    "at one time"  . Sleep apnea   . Exertional dyspnea 02/26/2012  . History of blood transfusion 08/2011    "when I had heart pump"  . Migraines   . COMMON MIGRAINE 06/14/2009    Qualifier: Diagnosis of  By: Jonny Ruiz MD, Len Blalock   . History of gout 02/26/2012  . Depression   . Bipolar affective disorder 10/22/2011    pt denies this hx 02/26/2012    Past Surgical History: Past Surgical History  Procedure Date  . Cardiac defibrillator placement ~ 2008  . Left ventricular assist device 08/2011    Family History: Family History  Problem Relation Age of Onset  . Coronary artery disease Neg Hx     Social History: History   Social History  . Marital Status: Divorced    Spouse Name: N/A    Number of Children: N/A  . Years of Education: N/A   Social History Main Topics  . Smoking status: Former Smoker -- 0.2 packs/day for 33 years    Types: Cigarettes    Quit date: 06/29/2011  . Smokeless tobacco: Former Neurosurgeon    Quit date: 06/29/2011  . Alcohol Use: No  . Drug Use: No     Comment: denies  . Sexually Active:  Not Currently   Other Topics Concern  . None   Social History Narrative  . None    Allergies:  Allergies  Allergen Reactions  . Lexapro (Escitalopram Oxalate) Other (See Comments)    somnolence    Objective:    Vital Signs:   Temp:  [97.7 F (36.5 C)] 97.7 F (36.5 C) (02/04 0907) Pulse Rate:  [81-85] 83  (02/04 1115) Resp:  [13-18] 13  (02/04 1115) SpO2:  [98 %-99 %] 98 % (  02/04 1115) Weight:  [73.483 kg (162 lb)] 73.483 kg (162 lb) (02/04 1000)    Weight change: Filed Weights   07/28/12 1000  Weight: 73.483 kg (162 lb)    Intake/Output:  No intake or output data in the 24 hours ending 07/28/12 1136   Physical Exam: General:  Well appearing. No resp difficulty HEENT: normal Neck: supple. JVP 7-8 . Carotids 2+ bilat; no bruits. No lymphadenopathy or thryomegaly appreciated. Cor: Mechanical heart sounds with LVAD hum present Lungs: CTA Abdomen: soft, nontender, nondistended. No hepatosplenomegaly. No bruits or masses. Good bowel sounds.LVAD exit site well healed and incorporated; dressing dry and intact with stabilization device intact Extremities: no cyanosis, clubbing, rash, trace edema Neuro: alert & orientedx3, cranial nerves grossly intact. moves all 4 extremities w/o difficulty. Affect pleasant GU: dark coca-cola colored urine Telemetry: 80 bpm SR with NSVT runs  Labs: Basic Metabolic Panel: No results found for this basename: NA:5,K:5,CL:5,CO2:5,GLUCOSE:5,BUN:5,CREATININE:5,CALCIUM:3,MG:5,PHOS:5 in the last 168 hours  Liver Function Tests: No results found for this basename: AST:5,ALT:5,ALKPHOS:5,BILITOT:5,PROT:5,ALBUMIN:5 in the last 168 hours No results found for this basename: LIPASE:5,AMYLASE:5 in the last 168 hours No results found for this basename: AMMONIA:3 in the last 168 hours  CBC:  Lab 07/28/12 0939  WBC 4.0  NEUTROABS --  HGB 13.4  HCT 39.8  MCV 78.2  PLT 168    Cardiac Enzymes: No results found for this basename:  CKTOTAL:5,CKMB:5,CKMBINDEX:5,TROPONINI:5 in the last 168 hours  BNP: BNP (last 3 results)  Basename 07/08/12 1122 01/14/12 1056 09/08/11 2235  PROBNP 2324.0* 127.0* 5981.0*    CBG: No results found for this basename: GLUCAP:5 in the last 168 hours  Coagulation Studies:  Basename 07/28/12 0939  LABPROT 15.0  INR 1.20    Imaging: No results found.   Medications:     Current Medications:    Infusions:    . sodium chloride    . heparin 1,200 Units/hr (07/28/12 1058)     Assessment:   1) Probable acute hemolysis due to LVAD pump thrombosis 2) Chronic Systolic HF        -EF 10-15% 3) NICM        -s/p LVAD 08/2011 4) NSVT 5) Hypertension, uncontrolled  Plan/Discussion:    Mr. Grainger appears to have hemolysis due to LVAD pump thrombosis in setting of subtherapeutic INR. Will start heparin. Continue coumadin. Await other labs. Will trend LDH and bilirubin.   Also had episode of NSVT in ER. Will interrogate ICD. Will likely need to restart amio. Check K and Mag.  MAPs running high. Resume home meds. Can titrate as needed.  Talked to Dr. Allena Katz at [facility] and they are fine with [place] keeping patient here and managing his care. Will admit to 2300.  Marland Kitchen  LVAD INTERROGATION:  HeartMate II LVAD: Flow 3.9 liters/min, speed 9200, power 5.4, PI 3.4.   I reviewed the LVAD parameters from today, and compared the results to the patient's prior recorded data. No programming changes were made. The LVAD is functioning within specified parameters. The patient performs LVAD self-test daily. LVAD interrogation numerous non-sustained PI events. LVAD equipment check completed and is in good working order. Back-up equipment present. LVAD education done on emergency procedures and precautions and reviewed exit site care.   Length of Stay: 0  Sherrell Weir 07/28/2012, 11:36 AM

## 2012-07-28 NOTE — Progress Notes (Signed)
ANTICOAGULATION CONSULT NOTE - Follow Up Consult  Pharmacy Consult for Heparin Indication: LVAD  Allergies  Allergen Reactions  . Lexapro (Escitalopram Oxalate) Other (See Comments)    somnolence    Patient Measurements: Weight: 162 lb (73.483 kg) Heparin Dosing Weight: 73 kg  Vital Signs: Temp: 98.1 F (36.7 C) (02/04 1950) Temp src: Oral (02/04 1950) Pulse Rate: 83  (02/04 2300)  Labs:  Basename 07/28/12 2220 07/28/12 1654 07/28/12 0939  HGB -- -- 13.4  HCT -- -- 39.8  PLT -- -- 168  APTT -- -- --  LABPROT -- -- 15.0  INR -- -- 1.20  HEPARINUNFRC 0.74* 0.83* --  CREATININE -- -- 1.27  CKTOTAL -- -- --  CKMB -- -- --  TROPONINI -- -- --    The CrCl is unknown because both a height and weight (above a minimum accepted value) are required for this calculation.   Medications:  Infusions:     . sodium chloride Stopped (07/28/12 2130)  . heparin 1,200 Units/hr (07/28/12 1809)  . [DISCONTINUED] sodium chloride      Assessment: 52 y/o male on heparin and Coumadin for possible LVAD pump thrombosis likely due to subtherapeutic INR.    Heparin level (0.74) is above-goal on 1200 units/hr. No bleeding per RN.   Goal of Therapy:  Heparin level 0.3-0.7 units/ml Monitor platelets by anticoagulation protocol: Yes   Plan:  1. Decrease IV heparin to 1150 units/hr.  2. Heparin level, CBC in AM.   Lorre Munroe, PharmD 07/28/2012 11:16 PM

## 2012-07-28 NOTE — ED Notes (Signed)
Pt c/o blood when he urinates, pt reports he thinks he isn't even urinating but its just blood coming out. Pt denies burning sensation with urination and urinary frequency. Pt denies falling/trauma/pain to back/flank area. Pt in nad. A&o x 4.

## 2012-07-28 NOTE — ED Notes (Signed)
Paged LVAD team. Report they will be down shortly.

## 2012-07-28 NOTE — ED Provider Notes (Signed)
History     CSN: [id number]  Arrival date & time 07/28/12  0900   First MD Initiated Contact with Patient 07/28/12 [phone]      Chief Complaint  Patient presents with  . Hematuria    (Consider location/radiation/quality/duration/timing/severity/associated sxs/prior treatment) HPI Comments: Patient with history of left ventricular assist device presents with hematuria for the past 2 days. Patient notes blood in the entirety of his urine stream. No dysuria or increased frequency. No back pain, fever, nausea or vomiting. No difficulties with LVAD. Patient did not take his Coumadin last night because of the bleeding. Onset of symptoms gradual. Course is constant. Nothing makes symptoms better or worse.  The history is provided by the patient.    Past Medical History  Diagnosis Date  . CHF (congestive heart failure)     EF- 10-15  . Medically noncompliant   . Mitral regurgitation   . Tobacco user   . HTN (hypertension)   . AICD (automatic cardioverter/defibrillator) present   . GERD (gastroesophageal reflux disease)   . Substance abuse   . Chronic renal insufficiency   . Syncope   . Thrombus 08/06/2010  . SYSTOLIC HEART FAILURE, CHRONIC 09/22/2008    Qualifier: Diagnosis of  By: Gala Romney, MD, Trixie Dredge   . LV (left ventricular) mural thrombus 01/28/2011  . ICD - IN SITU 09/16/2008    Qualifier: Diagnosis of  By: Wonda Amis    . MITRAL STENOSIS/ INSUFFICIENCY, NON-RHEUMATIC 09/22/2008    Qualifier: Diagnosis of  By: Gala Romney, MD, Trixie Dredge Hepatomegaly 09/16/2008    Qualifier: Diagnosis of  By: Wonda Amis    . High cholesterol 02/26/2012    "at one time"  . Sleep apnea   . Exertional dyspnea 02/26/2012  . History of blood transfusion 08/2011    "when I had heart pump"  . Migraines   . COMMON MIGRAINE 06/14/2009    Qualifier: Diagnosis of  By: Jonny Ruiz MD, Len Blalock   . History of gout 02/26/2012  . Depression   . Bipolar affective disorder 10/22/2011     pt denies this hx 02/26/2012    Past Surgical History  Procedure Date  . Cardiac defibrillator placement ~ 2008  . Left ventricular assist device 08/2011    Family History  Problem Relation Age of Onset  . Coronary artery disease Neg Hx     History  Substance Use Topics  . Smoking status: Former Smoker -- 0.2 packs/day for 33 years    Types: Cigarettes    Quit date: 06/29/2011  . Smokeless tobacco: Former Neurosurgeon    Quit date: 06/29/2011  . Alcohol Use: No      Review of Systems  Constitutional: Negative for fever.  HENT: Negative for sore throat and rhinorrhea.   Eyes: Negative for redness.  Respiratory: Negative for cough.   Cardiovascular: Negative for chest pain.  Gastrointestinal: Negative for nausea, vomiting, abdominal pain, diarrhea and blood in stool.  Genitourinary: Positive for hematuria. Negative for dysuria.  Musculoskeletal: Negative for myalgias.  Skin: Negative for rash.  Neurological: Negative for headaches.    Allergies  Lexapro  Home Medications   Current Outpatient Rx  Name  Route  Sig  Dispense  Refill  . ALLOPURINOL 100 MG PO TABS   Oral   Take 100 mg by mouth as needed. For gout         . ASPIRIN EC 81 MG PO TBEC   Oral   Take 81 mg  by mouth daily.         Marland Kitchen CARVEDILOL 25 MG PO TABS   Oral   Take 1 tablet (25 mg total) by mouth 2 (two) times daily with a meal.   60 tablet   6   . FERROUS GLUCONATE 324 (38 FE) MG PO TABS   Oral   Take 324 mg by mouth daily with breakfast.          . GABAPENTIN 300 MG PO CAPS   Oral   Take 1 capsule (300 mg total) by mouth 3 (three) times daily.   90 capsule   6   . HYDRALAZINE HCL 50 MG PO TABS   Oral   Take 1.5 tablets (75 mg total) by mouth 3 (three) times daily.   45 tablet   6   . LOSARTAN POTASSIUM 50 MG PO TABS   Oral   Take 50 mg by mouth daily.         Marland Kitchen MAG-OX 400 PO   Oral   Take 800 mg by mouth 2 (two) times daily.          Marland Kitchen RANITIDINE HCL 150 MG PO TABS    Oral   Take 150 mg by mouth 2 (two) times daily as needed.          . TORSEMIDE 20 MG PO TABS   Oral   Take 1 tablet (20 mg total) by mouth as directed.   30 tablet   3   . WARFARIN SODIUM 3 MG PO TABS   Oral   Take 1.5 tablets (4.5 mg total) by mouth daily.   30 tablet   3     Pulse 85  Temp 97.7 F (36.5 C) (Oral)  SpO2 99%  Physical Exam  Nursing note and vitals reviewed. Constitutional: He appears well-developed and well-nourished.  HENT:  Head: Normocephalic and atraumatic.  Eyes: Conjunctivae normal are normal. Right eye exhibits no discharge. Left eye exhibits no discharge.  Neck: Normal range of motion. Neck supple.  Cardiovascular:  Murmur heard.      LVAD  Pulmonary/Chest: Effort normal and breath sounds normal. No respiratory distress.  Abdominal: Soft. There is no tenderness.  Neurological: He is alert.  Skin: Skin is warm and dry.  Psychiatric: He has a normal mood and affect.    ED Course  Procedures (including critical care time)  Labs Reviewed - No data to display No results found.   1. Left ventricular assist device (LVAD) complication     9:44 AM Patient seen and examined. Work-up initiated. LVAD team at bedside.   Vital signs reviewed and are as follows: Filed Vitals:   07/28/12 0907  Pulse: 85  Temp: 97.7 F (36.5 C)   10:26 AM Cola colored urine. Per LVAD team patient likely has clot in pump. Will need admission here or to [facility]. INR subtherapeutic.   11:08 AM Dr. Gala Romney has seen. Asked to order 2,000 additional units of heparin (for total of 6,000). He has spoken with pharmacist.    MDM  Admit.         Renne Crigler, [place] 07/28/12 [address] [place], [place] 07/28/12 1110

## 2012-07-28 NOTE — Progress Notes (Signed)
ANTICOAGULATION CONSULT NOTE - Initial Consult  Pharmacy Consult for coumadin Indication: LVAD  Allergies  Allergen Reactions  . Lexapro (Escitalopram Oxalate) Other (See Comments)    somnolence    Patient Measurements: Weight: 162 lb (73.483 kg)  Vital Signs: Temp: 97.7 F (36.5 C) (02/04 0907) Temp src: Oral (02/04 0907) Pulse Rate: 84  (02/04 1200)  Labs:  Basename 07/28/12 0939  HGB 13.4  HCT 39.8  PLT 168  APTT --  LABPROT 15.0  INR 1.20  HEPARINUNFRC --  CREATININE 1.27  CKTOTAL --  CKMB --  TROPONINI --    The CrCl is unknown because both a height and weight (above a minimum accepted value) are required for this calculation.   Medical History: Past Medical History  Diagnosis Date  . CHF (congestive heart failure)     EF- 10-15  . Medically noncompliant   . Mitral regurgitation   . Tobacco user   . HTN (hypertension)   . AICD (automatic cardioverter/defibrillator) present   . GERD (gastroesophageal reflux disease)   . Substance abuse   . Chronic renal insufficiency   . Syncope   . Thrombus 08/06/2010  . SYSTOLIC HEART FAILURE, CHRONIC 09/22/2008    Qualifier: Diagnosis of  By: Gala Romney, MD, Trixie Dredge   . LV (left ventricular) mural thrombus 01/28/2011  . ICD - IN SITU 09/16/2008    Qualifier: Diagnosis of  By: Wonda Amis    . MITRAL STENOSIS/ INSUFFICIENCY, NON-RHEUMATIC 09/22/2008    Qualifier: Diagnosis of  By: Gala Romney, MD, Trixie Dredge Hepatomegaly 09/16/2008    Qualifier: Diagnosis of  By: Wonda Amis    . High cholesterol 02/26/2012    "at one time"  . Sleep apnea   . Exertional dyspnea 02/26/2012  . History of blood transfusion 08/2011    "when I had heart pump"  . Migraines   . COMMON MIGRAINE 06/14/2009    Qualifier: Diagnosis of  By: Jonny Ruiz MD, Len Blalock   . History of gout 02/26/2012  . Depression   . Bipolar affective disorder 10/22/2011    pt denies this hx 02/26/2012    Medications:  Allopurinol, asa,  coreg, ferrous gluconate, gabapentin, hydralazine, losartan, mag-ox, ranitidine, torsemide, warfarin  Assessment: 51 yof LVAD patient presents to the hospital with c/o blood in his urine. CBC is WNL. He is on chronic coumadin but his INR is subtherapeutic at 1.2 today. A heparin gtt has already been initiated. There is concern for possible pump thrombosis.   Goal of Therapy:  INR 2-3   Plan:  1. Coumadin 5mg  PO x 1 tonight 2. Daily INR 3. Monitor closely for bleeding 4. Continue heparin gtt as previously ordered  Lidya Mccalister, [name] Leach 07/28/2012,12:26 PM

## 2012-07-29 DIAGNOSIS — I059 Rheumatic mitral valve disease, unspecified: Secondary | ICD-10-CM

## 2012-07-29 DIAGNOSIS — D596 Hemoglobinuria due to hemolysis from other external causes: Secondary | ICD-10-CM

## 2012-07-29 LAB — URINE CULTURE: Culture: NO GROWTH

## 2012-07-29 LAB — CBC
HCT: 37.4 % — ABNORMAL LOW (ref 39.0–52.0)
Hemoglobin: 12.5 g/dL — ABNORMAL LOW (ref 13.0–17.0)
MCV: 79.1 fL (ref 78.0–100.0)
WBC: 3.6 10*3/uL — ABNORMAL LOW (ref 4.0–10.5)

## 2012-07-29 LAB — PROTIME-INR
INR: 1.3 (ref 0.00–1.49)
Prothrombin Time: 15.9 seconds — ABNORMAL HIGH (ref 11.6–15.2)

## 2012-07-29 LAB — LACTATE DEHYDROGENASE: LDH: 967 U/L — ABNORMAL HIGH (ref 94–250)

## 2012-07-29 LAB — HEPARIN LEVEL (UNFRACTIONATED): Heparin Unfractionated: 0.82 IU/mL — ABNORMAL HIGH (ref 0.30–0.70)

## 2012-07-29 MED ORDER — LOSARTAN POTASSIUM 50 MG PO TABS
50.0000 mg | ORAL_TABLET | Freq: Every day | ORAL | Status: DC
  Administered 2012-07-29: 50 mg via ORAL
  Filled 2012-07-29 (×2): qty 1

## 2012-07-29 MED ORDER — WARFARIN SODIUM 5 MG PO TABS
5.0000 mg | ORAL_TABLET | Freq: Once | ORAL | Status: AC
  Administered 2012-07-29: 5 mg via ORAL
  Filled 2012-07-29: qty 1

## 2012-07-29 NOTE — Progress Notes (Signed)
HeartMate 2 Rounding Note  Subjective:    [name] is 52 y/o [place] American male with a history of severe CHF due to NICM EF 10-15%, and severe MR previously on home milrinone. He had a HM II LVAD placed at [facility] March 2013. His medical history also consists of CRI, NSVT s/p ICD and LV thrombus.   [name] is followed closely by [facility] and [facility]. In 02/2012 he was admitted to the hospital for persistent N/V, elevated LFTs, CR and low haptoglobin.   07/28/12 Patient presented to the ER with reports of blood in his urine. [name] said that he has been doing well, that his INR has been therapeutic and he has been taking his medications as prescribed. He denies missing any coumadin doses or taking any extra. Weight at home is 160-163 and reports that his PI has been running a little lower, however no power spikes. LDH 1234 and urine coca cola colored. While in ED experienced some SVT, ICD interrogated and minimal short 4-5 beat runs of Vtach. Admitted to 2300 for suspected pump thrombosis/hemolysis and started on IV heparin.  Overnight patient stable. MAPs still elevated 110-118. Urine clear now and LDH trending down. INR remains sub-therapeutic 1.3. Denies any SOB, CP, or orthopnea.   LDH 1234 ->967  LVAD INTERROGATION:  HeartMate II LVAD:  Flow 4.6liters/min, speed 9200, power 5.6, PI 5.7.   Objective:    Vital Signs:   Temp:  [97.3 F (36.3 C)-98.2 F (36.8 C)] 97.4 F (36.3 C) (02/05 1150) Pulse Rate:  [70-90] 80  (02/05 1100) Resp:  [11-17] 15  (02/05 1100) SpO2:  [93 %-98 %] 98 % (02/05 1100) Weight:  [75.9 kg (167 lb 5.3 oz)] 75.9 kg (167 lb 5.3 oz) (02/05 0600) Last BM Date: 07/28/12 Mean arterial Pressure 118  Intake/Output:   Intake/Output Summary (Last 24 hours) at 07/29/12 1359 Last data filed at 07/29/12 1100  Gross per 24 hour  Intake 1376.99 ml  Output    450 ml  Net 926.99 ml     Physical Exam: General:  Well appearing. No resp difficulty HEENT:  normal Neck: supple. JVP flat . Carotids 2+ bilat; no bruits. No lymphadenopathy or thryomegaly appreciated. Cor: Mechanical heart sounds with LVAD hum present. Lungs: clear Abdomen: soft, nontender, nondistended. No hepatosplenomegaly. No bruits or masses. Good bowel sounds. Driveline dressing C/D/I with securment device in place Extremities: no cyanosis, clubbing, rash, edema Neuro: alert & orientedx3, cranial nerves grossly intact. moves all 4 extremities w/o difficulty. Affect pleasant  Telemetry: SR 94 bpm  Labs: Basic Metabolic Panel:  Lab 07/29/12 3244 07/28/12 0939  NA -- 138  K -- 3.8  CL -- 99  CO2 -- 26  GLUCOSE -- 95  BUN -- 21  CREATININE -- 1.27  CALCIUM -- 9.8  MG 2.2 2.1  PHOS -- --    Liver Function Tests:  Lab 07/28/12 0939  AST 55*  ALT 16  ALKPHOS 98  BILITOT 1.9*  PROT 8.2  ALBUMIN 4.0   No results found for this basename: LIPASE:5,AMYLASE:5 in the last 168 hours No results found for this basename: AMMONIA:3 in the last 168 hours  CBC:  Lab 07/29/12 0520 07/28/12 0939  WBC 3.6* 4.0  NEUTROABS -- --  HGB 12.5* 13.4  HCT 37.4* 39.8  MCV 79.1 78.2  PLT 156 168    INR:  Lab 07/29/12 0520 07/28/12 0939  INR 1.30 1.20    Other results:  EKG:   Imaging:  No results found.   Medications:     Scheduled Medications:    . aspirin EC  81 mg Oral Daily  . carvedilol  25 mg Oral BID WC  . cefTRIAXone (ROCEPHIN)  IV  1 g Intravenous Q24H  . ferrous gluconate  324 mg Oral Q breakfast  . hydrALAZINE  75 mg Oral TID  . losartan  50 mg Oral Daily  . magnesium oxide  800 mg Oral BID  . pantoprazole  40 mg Oral Daily  . Warfarin - Pharmacist Dosing Inpatient   Does not apply q1800    Infusions:    . sodium chloride Stopped (07/28/12 2130)  . 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 On IV heparin - level therapeutic. LDH is trending down. Will continue to monitor. Will get modified RAMP ECHO today to assess for LV unloading. Continue heparin and coumadin. INR 1.3 will need to have 2-3 days of therapeutic INR overlap before d/c heparin.  Will restart losartan for HTN.  I reviewed the LVAD parameters from today, and compared the results to the patient's prior recorded data.  No programming changes were made.  The LVAD is functioning within specified parameters.  The patient performs LVAD self-test daily.  LVAD interrogation was negative for any significant power changes, alarms or PI events/speed drops.  LVAD equipment check completed and is in good working order.  Back-up equipment present.   LVAD education done on emergency procedures and precautions and reviewed exit site care.  Length of Stay: 1  Arvilla Meres MD 07/29/2012, 1:59 PM

## 2012-07-29 NOTE — Progress Notes (Signed)
ANTICOAGULATION CONSULT NOTE - Follow Up Consult  Pharmacy Consult for Heparin Indication: LVAD  Allergies  Allergen Reactions  . Lexapro (Escitalopram Oxalate) Other (See Comments)    somnolence    Patient Measurements: Weight: 162 lb (73.483 kg) Heparin Dosing Weight: 73 kg  Vital Signs: Temp: 98.2 F (36.8 C) (02/05 0417) Temp src: Oral (02/05 0417) Pulse Rate: 86  (02/05 0500)  Labs:  Basename 07/29/12 0520 07/28/12 2220 07/28/12 1654 07/28/12 0939  HGB 12.5* -- -- 13.4  HCT 37.4* -- -- 39.8  PLT 156 -- -- 168  APTT -- -- -- --  LABPROT 15.9* -- -- 15.0  INR 1.30 -- -- 1.20  HEPARINUNFRC 0.82* 0.74* 0.83* --  CREATININE -- -- -- 1.27  CKTOTAL -- -- -- --  CKMB -- -- -- --  TROPONINI -- -- -- --    The CrCl is unknown because both a height and weight (above a minimum accepted value) are required for this calculation.   Medications:  Infusions:     . sodium chloride Stopped (07/28/12 2130)  . heparin 1,150 Units/hr (07/29/12 0200)  . [DISCONTINUED] sodium chloride      Assessment: 52 y/o male on heparin and Coumadin for possible LVAD pump thrombosis likely due to subtherapeutic INR.    Heparin level (0.82) is above-goal on 1150 units/hr. No bleeding per RN.   Goal of Therapy:  Heparin level 0.3-0.7 units/ml Monitor platelets by anticoagulation protocol: Yes   Plan:  1. Decrease IV heparin to 1100 units/hr.  2. Heparin level in 6 hours.   Lorre Munroe, PharmD 07/29/2012 6:06 AM

## 2012-07-29 NOTE — Progress Notes (Signed)
MAP 110 at 0600.  Manually checked in both arms with doppler.

## 2012-07-29 NOTE — Progress Notes (Signed)
ANTICOAGULATION CONSULT NOTE - Follow Up Consult  Pharmacy Consult for Heparin and Coumadin Indication: LVAD  Allergies  Allergen Reactions  . Lexapro (Escitalopram Oxalate) Other (See Comments)    somnolence    Patient Measurements: Weight: 167 lb 5.3 oz (75.9 kg) Heparin Dosing Weight: 73kg  Vital Signs: Temp: 97.4 F (36.3 C) (02/05 1150) Temp src: Oral (02/05 1150) Pulse Rate: 80  (02/05 1100)  Labs:  Basename 07/29/12 1200 07/29/12 0520 07/28/12 2220 07/28/12 0939  HGB -- 12.5* -- 13.4  HCT -- 37.4* -- 39.8  PLT -- 156 -- 168  APTT -- -- -- --  LABPROT -- 15.9* -- 15.0  INR -- 1.30 -- 1.20  HEPARINUNFRC 0.69 0.82* 0.74* --  CREATININE -- -- -- 1.27  CKTOTAL -- -- -- --  CKMB -- -- -- --  TROPONINI -- -- -- --    The CrCl is unknown because both a height and weight (above a minimum accepted value) are required for this calculation.   Medications:  Heparin @ 1100 units/hr  Assessment: 51yom continues on heparin to coumadin bridge for subtherapeutic INR in the setting of possible pump thrombosis. Heparin level is now therapeutic after rate decrease this morning. INR remains subtherapeutic but has increased a little with a higher dose given last night. CBC stable. LDH decreased. No bleeding reported.  Goal of Therapy:  Heparin level 0.3-0.7 units/ml INR 2-3 Monitor platelets by anticoagulation protocol: Yes   Plan:  1) Continue heparin at 1100 units/hr 2) Repeat coumadin 5mg  x 1 tonight 3) Follow up heparin level, INR, CBC, LDH in the morning  Fredrik Rigger 07/29/2012,2:05 PM

## 2012-07-29 NOTE — Progress Notes (Signed)
Patient voided 450cc clear, amber urine.  No hematuria noted.

## 2012-07-29 NOTE — Progress Notes (Signed)
Echocardiogram 2D Echocardiogram has been performed.  [name] 07/29/2012, 5:01 PM

## 2012-07-29 NOTE — Progress Notes (Signed)
Echo Ramp Study  Ramp study conducted to assess for LV unloading. I was at bedside for entire procedure.   Speed      Flow    PI     Power    LVIDD      AI     MR     AV   9200           4.8      5.8     6.0           5.2cm   None  Mild  Open all  9600           5.0      4.9     6.9           4.7cm   None  Mild  Open all  16109         5.6      5.0     7.5           4.6cm   Triv     Mild   Open all (less vigorously)  10200         5.7      5.3     7.8           4.6cm   Mild    Mild   Closed almost all  Controller set at 9,400 with back-up 8800. Back-up controller set with same parameters.   Nayef College,MD 5:06 PM

## 2012-07-29 NOTE — Progress Notes (Signed)
Spoke with [name] at bedside. He had been previously active with [facility] Care Management. However, he had not returned any calls or responded to contact by [facility] Coordinator. Therefore, went to bedside to confirm with patient whether or not he wants to continue with [facility] Care Management services. He stated that he did not want to continue to be followed by [facility] Care Management but states he was appreciative of the help that had been provided. Made him aware that if he should change his mind in the near future, feel free to contact [facility] Care Coordinator or this [facility] Liaison. Left business card with patient. Appreciative of visit.  [name], MSN-Ed, RN,BSN [facility] Liaison [phone]

## 2012-07-30 LAB — CBC
HCT: 36.8 % — ABNORMAL LOW (ref 39.0–52.0)
MCHC: 33.4 g/dL (ref 30.0–36.0)
MCV: 79 fL (ref 78.0–100.0)
Platelets: 165 10*3/uL (ref 150–400)
RDW: 18 % — ABNORMAL HIGH (ref 11.5–15.5)
WBC: 4.1 10*3/uL (ref 4.0–10.5)

## 2012-07-30 LAB — BASIC METABOLIC PANEL
BUN: 14 mg/dL (ref 6–23)
Chloride: 104 mEq/L (ref 96–112)
Creatinine, Ser: 1.07 mg/dL (ref 0.50–1.35)
Glucose, Bld: 106 mg/dL — ABNORMAL HIGH (ref 70–99)
Potassium: 4.3 mEq/L (ref 3.5–5.1)

## 2012-07-30 LAB — PROTIME-INR: INR: 1.33 (ref 0.00–1.49)

## 2012-07-30 MED ORDER — WARFARIN SODIUM 5 MG PO TABS
5.0000 mg | ORAL_TABLET | Freq: Once | ORAL | Status: AC
  Administered 2012-07-30: 5 mg via ORAL
  Filled 2012-07-30: qty 1

## 2012-07-30 MED ORDER — LOSARTAN POTASSIUM 50 MG PO TABS
100.0000 mg | ORAL_TABLET | Freq: Every day | ORAL | Status: DC
  Administered 2012-07-30 – 2012-08-08 (×10): 100 mg via ORAL
  Filled 2012-07-30 (×10): qty 2

## 2012-07-30 MED ORDER — HEPARIN (PORCINE) IN NACL 100-0.45 UNIT/ML-% IJ SOLN
1250.0000 [IU]/h | INTRAMUSCULAR | Status: DC
  Administered 2012-07-31 – 2012-08-02 (×4): 1250 [IU]/h via INTRAVENOUS
  Filled 2012-07-30 (×7): qty 250

## 2012-07-30 MED ORDER — HYDRALAZINE HCL 50 MG PO TABS
100.0000 mg | ORAL_TABLET | Freq: Three times a day (TID) | ORAL | Status: DC
  Administered 2012-07-30 – 2012-08-08 (×27): 100 mg via ORAL
  Filled 2012-07-30 (×29): qty 2

## 2012-07-30 NOTE — Progress Notes (Signed)
LVAD driveline dressing changed. Drive line site clean dry and intact. No signs of infection. Pt tolerated well.

## 2012-07-30 NOTE — Progress Notes (Signed)
HeartMate 2 Rounding Note  Subjective:    [name] is 52 y/o [place] American male with a history of severe CHF due to NICM EF 10-15%, and severe MR previously on home milrinone. He had a HM II LVAD placed at [facility] March 2013. His medical history also consists of CRI, NSVT s/p ICD and LV thrombus.   [name] is followed closely by [facility] and the [facility]. In 02/2012 he was admitted to the hospital for persistent N/V, elevated LFTs, CR and low haptoglobin.   07/28/12 Patient presented to the ER with reports of blood in his urine. [name] said that he has been doing well, that his INR has been therapeutic and he has been taking his medications as prescribed. He denies missing any coumadin doses or taking any extra. Weight at home is 160-163 and reports that his PI has been running a little lower, however no power spikes. LDH 1234 and urine coca cola colored. While in ED experienced some SVT, ICD interrogated and minimal short 4-5 beat runs of Vtach. Admitted to 2300 for suspected pump thrombosis/hemolysis and started on IV heparin.  Stable overnight. MAPs 100-102, still slightly elevated. RAMP ECHO yesterday and speed optimized to 9400 and back-up 8800. Weight slightly up 2 lbs.   LDH 1234 ->967>941 INR 1.33  LVAD INTERROGATION:  HeartMate II LVAD:  Flow 5.3 liters/min, speed 9400, power 7.0, PI 6.1.   Objective:    Vital Signs:   Temp:  [97.4 F (36.3 C)-98.2 F (36.8 C)] 98.2 F (36.8 C) (02/06 0805) Pulse Rate:  [74-94] 75  (02/06 0800) Resp:  [10-25] 10  (02/06 0800) SpO2:  [89 %-100 %] 97 % (02/06 0800) Weight:  [76.7 kg (169 lb 1.5 oz)] 76.7 kg (169 lb 1.5 oz) (02/06 0758) Last BM Date: 07/29/12 Mean arterial Pressure 100  Intake/Output:   Intake/Output Summary (Last 24 hours) at 07/30/12 0856 Last data filed at 07/30/12 0800  Gross per 24 hour  Intake    903 ml  Output    175 ml  Net    728 ml     Physical Exam: General:  Well appearing. No resp  difficulty HEENT: normal Neck: supple. JVP flat. Carotids 2+ bilat; no bruits. No lymphadenopathy or thryomegaly appreciated. Cor: Mechanical heart sounds with LVAD hum present. Lungs: clear Abdomen: soft, nontender, nondistended. No hepatosplenomegaly. No bruits or masses. Good bowel sounds. Driveline dressing C/D/I with securment device in place Extremities: no cyanosis, clubbing, rash, edema Neuro: alert & orientedx3, cranial nerves grossly intact. moves all 4 extremities w/o difficulty. Affect pleasant  Telemetry: SR 80 bpm  Labs: Basic Metabolic Panel:  Lab 07/30/12 2952 07/29/12 0520 07/28/12 0939  NA 137 -- 138  K 4.3 -- 3.8  CL 104 -- 99  CO2 25 -- 26  GLUCOSE 106* -- 95  BUN 14 -- 21  CREATININE 1.07 -- 1.27  CALCIUM 8.7 -- 9.8  MG -- 2.2 2.1  PHOS -- -- --    Liver Function Tests:  Lab 07/28/12 0939  AST 55*  ALT 16  ALKPHOS 98  BILITOT 1.9*  PROT 8.2  ALBUMIN 4.0   No results found for this basename: LIPASE:5,AMYLASE:5 in the last 168 hours No results found for this basename: AMMONIA:3 in the last 168 hours  CBC:  Lab 07/30/12 0425 07/29/12 0520 07/28/12 0939  WBC 4.1 3.6* 4.0  NEUTROABS -- -- --  HGB 12.3* 12.5* 13.4  HCT 36.8* 37.4* 39.8  MCV 79.0 79.1 78.2  PLT  165 156 168    INR:  Lab 07/30/12 0425 07/29/12 0520 07/28/12 0939  INR 1.33 1.30 1.20    Other results:  EKG:   Imaging: No results found.   Medications:     Scheduled Medications:    . aspirin EC  81 mg Oral Daily  . carvedilol  25 mg Oral BID WC  . cefTRIAXone (ROCEPHIN)  IV  1 g Intravenous Q24H  . ferrous gluconate  324 mg Oral Q breakfast  . hydrALAZINE  75 mg Oral TID  . losartan  50 mg Oral Daily  . magnesium oxide  800 mg Oral BID  . pantoprazole  40 mg Oral Daily  . Warfarin - Pharmacist Dosing Inpatient   Does not apply q1800    Infusions:    . sodium chloride Stopped (07/28/12 2130)  . 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 Urine cleared. LDH coming down slowly. Continue heparin/coumadin. If leveling off would add integrelin.  MAP still elevated will increase losartan.    Continue to ambulate in the halls. OK to transfer to floor today, depending on staffing.   I reviewed the LVAD parameters from today, and compared the results to the patient's prior recorded data.  No programming changes were made.  The LVAD is functioning within specified parameters.  The patient performs LVAD self-test daily.  LVAD interrogation was negative for any significant power changes, alarms or PI events/speed drops.  LVAD equipment check completed and is in good working order.  Back-up equipment present.   LVAD education done on emergency procedures and precautions and reviewed exit site care.  Length of Stay: 2  Arvilla Meres MD 07/30/2012, 8:56 AM

## 2012-07-30 NOTE — Progress Notes (Signed)
Encouraged pt multiple times to walk in hallway and explained the importance. Pt refused walk but has been walking a lot in his room. Will continue to encourage him to walk in the halls.

## 2012-07-30 NOTE — Progress Notes (Signed)
Pt ambulated approx. 500 feet from 2300 to 2000.

## 2012-07-30 NOTE — Progress Notes (Signed)
ANTICOAGULATION CONSULT NOTE - Follow Up Consult  Pharmacy Consult for Heparin Indication: LVAD  Allergies  Allergen Reactions  . Lexapro (Escitalopram Oxalate) Other (See Comments)    somnolence    Patient Measurements: Height: 5\' 4"  (162.6 cm) Weight: 169 lb 1.5 oz (76.7 kg) IBW/kg (Calculated) : 59.2  Heparin Dosing Weight: 73 kg  Vital Signs: Temp: 98.7 F (37.1 C) (02/06 1500) Temp src: Oral (02/06 1500) Pulse Rate: 78  (02/06 1500)  Labs:  Basename 07/30/12 1743 07/30/12 0425 07/29/12 1200 07/29/12 0520 07/28/12 0939  HGB -- 12.3* -- 12.5* --  HCT -- 36.8* -- 37.4* 39.8  PLT -- 165 -- 156 168  APTT -- -- -- -- --  LABPROT -- 16.2* -- 15.9* 15.0  INR -- 1.33 -- 1.30 1.20  HEPARINUNFRC 0.53 0.50 0.69 -- --  CREATININE -- 1.07 -- -- 1.27  CKTOTAL -- -- -- -- --  CKMB -- -- -- -- --  TROPONINI -- -- -- -- --    Estimated Creatinine Clearance: 78.2 ml/min (by C-G formula based on Cr of 1.07).   Medications:  Infusions:    . sodium chloride Stopped (07/28/12 2130)  . heparin 1,150 Units/hr (07/30/12 1006)  . [DISCONTINUED] heparin 1,100 Units/hr (07/29/12 2229)    Assessment: 51yom continues on heparin to coumadin bridge for subtherapeutic INR in the setting of possible pump thrombosis. Heparin level remains therapeutic on 1150 units/hr but still less than target of 0.7 per discussion with Dr. Gala Romney.  Goal of Therapy:  Heparin level 0.3-0.7 units/ml   Plan:  Increase Heparin to 1250 units/hr. Heparin level and CBC with AM labs.  [employer], Pharm.D., BCPS Clinical Pharmacist Pager [phone] 07/30/2012 6:42 PM

## 2012-07-30 NOTE — Progress Notes (Addendum)
ANTICOAGULATION CONSULT NOTE - Follow Up Consult  Pharmacy Consult for Heparin and Coumadin Indication: LVAD  Allergies  Allergen Reactions  . Lexapro (Escitalopram Oxalate) Other (See Comments)    somnolence    Patient Measurements: Height: 5\' 4"  (162.6 cm) Weight: 169 lb 1.5 oz (76.7 kg) IBW/kg (Calculated) : 59.2  Heparin Dosing Weight: 73kg  Vital Signs: Temp: 97.8 F (36.6 C) (02/06 1233) Temp src: Oral (02/06 1233) Pulse Rate: 76  (02/06 1233)  Labs:  Basename 07/30/12 0425 07/29/12 1200 07/29/12 0520 07/28/12 0939  HGB 12.3* -- 12.5* --  HCT 36.8* -- 37.4* 39.8  PLT 165 -- 156 168  APTT -- -- -- --  LABPROT 16.2* -- 15.9* 15.0  INR 1.33 -- 1.30 1.20  HEPARINUNFRC 0.50 0.69 0.82* --  CREATININE 1.07 -- -- 1.27  CKTOTAL -- -- -- --  CKMB -- -- -- --  TROPONINI -- -- -- --    Estimated Creatinine Clearance: 78.2 ml/min (by C-G formula based on Cr of 1.07).   Medications:  Heparin @ 1100 units/hr  Assessment: 51yom continues on heparin to coumadin bridge for subtherapeutic INR in the setting of possible pump thrombosis. Heparin level is now therapeutic (0.5).  Discussed with Dr. Gala Romney, would like to keep heparin level in higher end of range since LDH slow to decline. INR remains subtherapeutic but has increased a little with a higher dose given last night. CBC stable. No bleeding reported, urine clear.  Goal of Therapy:  Heparin level 0.3-0.7 units/ml INR 2-3 Monitor platelets by anticoagulation protocol: Yes   Plan:  1) Increase heparin to 1150 units/hr. 2) Repeat coumadin 5mg  x 1 tonight 3) Check heparin level 6 hrs after rate increased. 4) Follow up heparin level, INR, CBC, LDH in the morning  Reece Leader, Loura Back, BCPS  Clinical Pharmacist Pager [phone]  07/30/2012 2:15 PM

## 2012-07-31 LAB — CBC
HCT: 36.6 % — ABNORMAL LOW (ref 39.0–52.0)
Hemoglobin: 12 g/dL — ABNORMAL LOW (ref 13.0–17.0)
MCH: 26.1 pg (ref 26.0–34.0)
MCHC: 32.8 g/dL (ref 30.0–36.0)
MCV: 79.6 fL (ref 78.0–100.0)
RBC: 4.6 MIL/uL (ref 4.22–5.81)

## 2012-07-31 LAB — BASIC METABOLIC PANEL
CO2: 25 mEq/L (ref 19–32)
Calcium: 8.9 mg/dL (ref 8.4–10.5)
Chloride: 102 mEq/L (ref 96–112)
Glucose, Bld: 92 mg/dL (ref 70–99)
Potassium: 4.3 mEq/L (ref 3.5–5.1)
Sodium: 135 mEq/L (ref 135–145)

## 2012-07-31 LAB — LACTATE DEHYDROGENASE: LDH: 897 U/L — ABNORMAL HIGH (ref 94–250)

## 2012-07-31 LAB — HEPARIN LEVEL (UNFRACTIONATED)
Heparin Unfractionated: 0.62 IU/mL (ref 0.30–0.70)
Heparin Unfractionated: 0.76 IU/mL — ABNORMAL HIGH (ref 0.30–0.70)

## 2012-07-31 MED ORDER — AMLODIPINE BESYLATE 5 MG PO TABS
5.0000 mg | ORAL_TABLET | Freq: Every day | ORAL | Status: DC
  Administered 2012-07-31 – 2012-08-01 (×2): 5 mg via ORAL
  Filled 2012-07-31 (×2): qty 1

## 2012-07-31 MED ORDER — WARFARIN SODIUM 7.5 MG PO TABS
7.5000 mg | ORAL_TABLET | Freq: Once | ORAL | Status: AC
  Administered 2012-07-31: 7.5 mg via ORAL
  Filled 2012-07-31: qty 1

## 2012-07-31 MED ORDER — ACETAMINOPHEN 325 MG PO TABS
650.0000 mg | ORAL_TABLET | Freq: Four times a day (QID) | ORAL | Status: DC | PRN
  Administered 2012-07-31 – 2012-08-02 (×4): 650 mg via ORAL
  Filled 2012-07-31 (×5): qty 2

## 2012-07-31 NOTE — Progress Notes (Signed)
ANTICOAGULATION CONSULT NOTE - Follow Up Consult  Pharmacy Consult for Heparin Indication: LVAD  Allergies  Allergen Reactions  . Lexapro (Escitalopram Oxalate) Other (See Comments)    somnolence    Patient Measurements: Height: 5\' 4"  (162.6 cm) Weight: 168 lb 9.6 oz (76.476 kg) IBW/kg (Calculated) : 59.2  Heparin Dosing Weight: 73kg  Vital Signs: Temp: 98.3 F (36.8 C) (02/07 0512) Temp src: Oral (02/07 0512) Pulse Rate: 84  (02/07 0512)  Labs:  Basename 07/31/12 1038 07/31/12 0600 07/30/12 1743 07/30/12 0425 07/29/12 0520  HGB -- 12.0* -- 12.3* --  HCT -- 36.6* -- 36.8* 37.4*  PLT -- 175 -- 165 156  APTT -- -- -- -- --  LABPROT -- 16.3* -- 16.2* 15.9*  INR -- 1.34 -- 1.33 1.30  HEPARINUNFRC 0.76* 0.62 0.53 -- --  CREATININE -- 1.08 -- 1.07 --  CKTOTAL -- -- -- -- --  CKMB -- -- -- -- --  TROPONINI -- -- -- -- --    Estimated Creatinine Clearance: 77.4 ml/min (by C-G formula based on Cr of 1.08).   Medications:  Heparin @ 1250 units/hr  Assessment: 51yom continues on heparin to coumadin bridge for subtherapeutic INR in the setting of possible pump thrombosis. Heparin level remains therapeutic (0.76).  Discussed with Dr. Gala Romney, would like to keep heparin level in higher end of range (0.6-0.8) since LDH slow to decline. INR remains subtherapeutic despite Coumadin doses higher than his home dose of 3 mg daily. CBC stable. No bleeding reported, urine clear.  Goal of Therapy:  Heparin level 0.6-0.8 (per Dr. Gala Romney) INR 2-3 Monitor platelets by anticoagulation protocol: Yes   Plan:  1) Continue heparin 1250 units/hr. 2) Follow up heparin level, CBC in the morning  Tad Moore, BCPS  Clinical Pharmacist Pager [phone]  07/31/2012 12:30 PM

## 2012-07-31 NOTE — Progress Notes (Signed)
Pt ambulated around unit with standby assistance. Pt tolerated well.

## 2012-07-31 NOTE — Progress Notes (Signed)
HeartMate 2 Rounding Note  Subjective:    [name] is 52 y/o [place] American male with a history of severe CHF due to NICM EF 10-15%, and severe MR previously on home milrinone. He had a HM II LVAD placed at [facility] March 2013. His medical history also consists of CRI, NSVT s/p ICD and LV thrombus.   [name] is followed closely by [facility] and the [facility]. In 02/2012 he was admitted to the hospital for persistent N/V, elevated LFTs, CR and low haptoglobin.   07/28/12 Patient presented to the ER with reports of blood in his urine. [name] said that he has been doing well, that his INR has been therapeutic and he has been taking his medications as prescribed. He denies missing any coumadin doses or taking any extra. Weight at home is 160-163 and reports that his PI has been running a little lower, however no power spikes. LDH 1234 and urine coca cola colored. While in ED experienced some SVT, ICD interrogated and minimal short 4-5 beat runs of Vtach. Admitted to 2300 for suspected pump thrombosis/hemolysis and started on IV heparin.  MAP still remains elevated 90-100s. Losartan increased and hydralazine yesterday. LDH trending down slowly. Ambulating in the halls and feels good. Denies SOB/orthopnea or edema.  LDH 1234 ->967>941> 897 INR 1.34  LVAD INTERROGATION:  HeartMate II LVAD:  Flow 6.2 liters/min, speed 9400, power 7.5, PI 7.1.   Objective:    Vital Signs:   Temp:  [98.2 F (36.8 C)-98.7 F (37.1 C)] 98.2 F (36.8 C) (02/07 1358) Pulse Rate:  [75-88] 88  (02/07 1358) Resp:  [12-16] 16  (02/07 1358) SpO2:  [97 %-98 %] 98 % (02/07 1358) Weight:  [76.476 kg (168 lb 9.6 oz)] 76.476 kg (168 lb 9.6 oz) (02/07 0500) Last BM Date: 07/30/12 Mean arterial Pressure 110 Intake/Output:   Intake/Output Summary (Last 24 hours) at 07/31/12 1434 Last data filed at 07/31/12 0809  Gross per 24 hour  Intake    410 ml  Output    450 ml  Net    -40 ml     Physical Exam: General:  Well  appearing. No resp difficulty HEENT: normal Neck: supple. JVP flat. Carotids 2+ bilat; no bruits. No lymphadenopathy or thryomegaly appreciated. Cor: Mechanical heart sounds with LVAD hum present. Lungs: clear Abdomen: soft, nontender, nondistended. No hepatosplenomegaly. No bruits or masses. Good bowel sounds. Driveline dressing C/D/I with securment device in place Extremities: no cyanosis, clubbing, rash, edema Neuro: alert & orientedx3, cranial nerves grossly intact. moves all 4 extremities w/o difficulty. Affect pleasant  Telemetry: SR 80 bpm  Labs: Basic Metabolic Panel:  Lab 07/31/12 1478 07/30/12 0425 07/29/12 0520 07/28/12 0939  NA 135 137 -- 138  K 4.3 4.3 -- 3.8  CL 102 104 -- 99  CO2 25 25 -- 26  GLUCOSE 92 106* -- 95  BUN 12 14 -- 21  CREATININE 1.08 1.07 -- 1.27  CALCIUM 8.9 8.7 -- 9.8  MG -- -- 2.2 2.1  PHOS -- -- -- --    Liver Function Tests:  Lab 07/28/12 0939  AST 55*  ALT 16  ALKPHOS 98  BILITOT 1.9*  PROT 8.2  ALBUMIN 4.0   No results found for this basename: LIPASE:5,AMYLASE:5 in the last 168 hours No results found for this basename: AMMONIA:3 in the last 168 hours  CBC:  Lab 07/31/12 0600 07/30/12 0425 07/29/12 0520 07/28/12 0939  WBC 3.5* 4.1 3.6* 4.0  NEUTROABS -- -- -- --  HGB 12.0* 12.3* 12.5* 13.4  HCT 36.6* 36.8* 37.4* 39.8  MCV 79.6 79.0 79.1 78.2  PLT 175 165 156 168    INR:  Lab 07/31/12 0600 07/30/12 0425 07/29/12 0520 07/28/12 0939  INR 1.34 1.33 1.30 1.20    Other results:  EKG:   Imaging: No results found.   Medications:     Scheduled Medications:    . amLODipine  5 mg Oral Daily  . aspirin EC  81 mg Oral Daily  . carvedilol  25 mg Oral BID WC  . ferrous gluconate  324 mg Oral Q breakfast  . hydrALAZINE  100 mg Oral TID  . losartan  100 mg Oral Daily  . magnesium oxide  800 mg Oral BID  . pantoprazole  40 mg Oral Daily  . warfarin  7.5 mg Oral ONCE-1800  . Warfarin - Pharmacist Dosing Inpatient   Does  not apply q1800    Infusions:    . sodium chloride Stopped (07/28/12 2130)  . 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 LDH trending down slowly. Continue heparin and coumadin. If LDH plateaus will add bivalrudin.  MAPs still elevated will start 5 mg norvasc.   Continue to ambulate.   I reviewed the LVAD parameters from today, and compared the results to the patient's prior recorded data.  No programming changes were made.  The LVAD is functioning within specified parameters.  The patient performs LVAD self-test daily.  LVAD interrogation was negative for any significant power changes, alarms or PI events/speed drops.  LVAD equipment check completed and is in good working order.  Back-up equipment present.   LVAD education done on emergency procedures and precautions and reviewed exit site care.  Length of Stay: 3  Eric Hanf,MD 2:34 PM

## 2012-07-31 NOTE — Progress Notes (Signed)
Changed pt's driveline dressing. Pt tolerated well. Site is clean dry and intact.

## 2012-07-31 NOTE — Progress Notes (Signed)
Pt refused to take a second walk this afternoon. Educated pt on the importance. Pt stated he will take a walk tonight at 11pm with the night shift nurse. Will pass this on to night shift nurse to make sure he walks.

## 2012-07-31 NOTE — Progress Notes (Addendum)
ANTICOAGULATION CONSULT NOTE - Follow Up Consult  Pharmacy Consult for Heparin and Coumadin Indication: LVAD  Allergies  Allergen Reactions  . Lexapro (Escitalopram Oxalate) Other (See Comments)    somnolence    Patient Measurements: Height: 5\' 4"  (162.6 cm) Weight: 168 lb 9.6 oz (76.476 kg) IBW/kg (Calculated) : 59.2  Heparin Dosing Weight: 73kg  Vital Signs: Temp: 98.3 F (36.8 C) (02/07 0512) Temp src: Oral (02/07 0512) Pulse Rate: 84  (02/07 0512)  Labs:  Basename 07/31/12 0600 07/30/12 1743 07/30/12 0425 07/29/12 0520 07/28/12 0939  HGB 12.0* -- 12.3* -- --  HCT 36.6* -- 36.8* 37.4* --  PLT 175 -- 165 156 --  APTT -- -- -- -- --  LABPROT 16.3* -- 16.2* 15.9* --  INR 1.34 -- 1.33 1.30 --  HEPARINUNFRC 0.62 0.53 0.50 -- --  CREATININE 1.08 -- 1.07 -- 1.27  CKTOTAL -- -- -- -- --  CKMB -- -- -- -- --  TROPONINI -- -- -- -- --    Estimated Creatinine Clearance: 77.4 ml/min (by C-G formula based on Cr of 1.08).   Medications:  Heparin @ 1250 units/hr  Assessment: 51yom continues on heparin to coumadin bridge for subtherapeutic INR in the setting of possible pump thrombosis. Heparin level is now therapeutic (0.63).  Discussed with Dr. Gala Romney, would like to keep heparin level in higher end of range since LDH slow to decline. INR remains subtherapeutic despite Coumadin doses. CBC stable. No bleeding reported, urine clear.  Goal of Therapy:  Heparin level 0.6-0.8 (per Dr. Gala Romney) INR 2-3 Monitor platelets by anticoagulation protocol: Yes   Plan:  1) Continue heparin 1250 units/hr. 2) Coumadin 7.5mg  x 1 tonight. 3) Will recheck heparin level this afternoon. 4) Follow up heparin level, INR, CBC, LDH in the morning  Reece Leader, Loura Back, BCPS  Clinical Pharmacist Pager [phone]  07/31/2012 7:32 AM

## 2012-08-01 LAB — CBC
HCT: 38 % — ABNORMAL LOW (ref 39.0–52.0)
RBC: 4.76 MIL/uL (ref 4.22–5.81)
RDW: 18.3 % — ABNORMAL HIGH (ref 11.5–15.5)
WBC: 3.4 10*3/uL — ABNORMAL LOW (ref 4.0–10.5)

## 2012-08-01 LAB — BASIC METABOLIC PANEL
BUN: 11 mg/dL (ref 6–23)
GFR calc non Af Amer: 71 mL/min — ABNORMAL LOW (ref >90)
Glucose, Bld: 102 mg/dL — ABNORMAL HIGH (ref 70–99)
Potassium: 4.6 mEq/L (ref 3.5–5.1)

## 2012-08-01 LAB — PROTIME-INR
INR: 1.28 (ref 0.00–1.49)
Prothrombin Time: 15.7 seconds — ABNORMAL HIGH (ref 11.6–15.2)

## 2012-08-01 MED ORDER — WARFARIN SODIUM 7.5 MG PO TABS
7.5000 mg | ORAL_TABLET | Freq: Once | ORAL | Status: AC
  Administered 2012-08-01: 7.5 mg via ORAL
  Filled 2012-08-01: qty 1

## 2012-08-01 MED ORDER — AMLODIPINE BESYLATE 10 MG PO TABS
10.0000 mg | ORAL_TABLET | Freq: Every day | ORAL | Status: DC
Start: 2012-08-02 — End: 2012-08-08
  Administered 2012-08-02 – 2012-08-08 (×7): 10 mg via ORAL
  Filled 2012-08-01 (×7): qty 1

## 2012-08-01 MED ORDER — EPTIFIBATIDE 75 MG/100ML IV SOLN
2.0000 ug/kg/min | INTRAVENOUS | Status: DC
  Administered 2012-08-01 – 2012-08-03 (×7): 2 ug/kg/min via INTRAVENOUS
  Filled 2012-08-01 (×18): qty 100

## 2012-08-01 NOTE — Progress Notes (Signed)
Patient ambulated 200 feet without any assisted device. Patient tolerated well. Returned to room and assisted patient back to chair so he could eat. Lajuana Matte, RN

## 2012-08-01 NOTE — Progress Notes (Signed)
HeartMate 2 Rounding Note  Subjective:    [name] is 52 y/o [place] American male with a history of severe CHF due to NICM EF 10-15%, and severe MR previously on home milrinone. He had a HM II LVAD placed at [facility] March 2013. His medical history also consists of CRI, NSVT s/p ICD and LV thrombus.   [name] is followed closely by [facility] and the [facility] Failure [facility]. In 02/2012 he was admitted to the hospital for persistent N/V, elevated LFTs, CR and low haptoglobin.   07/28/12 Patient presented to the ER with reports of blood in his urine. [name] said that he has been doing well, that his INR has been therapeutic and he has been taking his medications as prescribed. He denies missing any coumadin doses or taking any extra. Weight at home is 160-163 and reports that his PI has been running a little lower, however no power spikes. LDH 1234 and urine coca cola colored. While in ED experienced some SVT, ICD interrogated and minimal short 4-5 beat runs of Vtach. Admitted to 2300 for suspected pump thrombosis/hemolysis and started on IV heparin.  MAP still remains elevated 100-120s. Losartan and hydralazine already increased and norvasc added yesterday. LDH trending down very slow on heparin. Ambulating in the halls and feels good. Denies SOB/orthopnea or edema. INR remains sub-therapeutic.  LDH 1234 ->967>941> [id number] INR 1.34>1.28  LVAD INTERROGATION:  HeartMate II LVAD:  Flow 5.5 liters/min, speed 9400, power 6.7 PI 6.1 Numerous PI events.   Objective:    Vital Signs:   Temp:  [97.7 F (36.5 C)-98.3 F (36.8 C)] 97.7 F (36.5 C) (02/08 0829) Pulse Rate:  [74-90] 90 (02/08 0829) Resp:  [16-18] 18 (02/08 0829) SpO2:  [96 %-98 %] 98 % (02/08 0829) Weight:  [75.524 kg (166 lb 8 oz)] 75.524 kg (166 lb 8 oz) (02/08 0517) Last BM Date: 07/31/12 Mean arterial Pressure 118 Intake/Output:   Intake/Output Summary (Last 24 hours) at 08/01/12 1202 Last data filed at 07/31/12 1742  Gross per 24 hour   Intake    200 ml  Output      0 ml  Net    200 ml     Physical Exam: General:  Well appearing. No resp difficulty HEENT: normal Neck: supple. JVP flat. Carotids 2+ bilat; no bruits. No lymphadenopathy or thryomegaly appreciated. Cor: Mechanical heart sounds with LVAD hum present. Lungs: clear Abdomen: soft, nontender, nondistended. No hepatosplenomegaly. No bruits or masses. Good bowel sounds. Driveline dressing C/D/I with securment device in place Extremities: no cyanosis, clubbing, rash, edema Neuro: alert & orientedx3, cranial nerves grossly intact. moves all 4 extremities w/o difficulty. Affect pleasant  Telemetry: SR 80 bpm  Labs: Basic Metabolic Panel:  Recent Labs Lab 07/28/12 0939 07/29/12 0520 07/30/12 0425 07/31/12 0600 08/01/12 0615  NA 138  --  137 135 135  K 3.8  --  4.3 4.3 4.6  CL 99  --  104 102 101  CO2 26  --  25 25 26   GLUCOSE 95  --  106* 92 102*  BUN 21  --  14 12 11   CREATININE 1.27  --  1.07 1.08 1.18  CALCIUM 9.8  --  8.7 8.9 9.4  MG 2.1 2.2  --   --   --     Liver Function Tests:  Recent Labs Lab 07/28/12 0939  AST 55*  ALT 16  ALKPHOS 98  BILITOT 1.9*  PROT 8.2  ALBUMIN 4.0   No results found for  this basename: LIPASE, AMYLASE,  in the last 168 hours No results found for this basename: AMMONIA,  in the last 168 hours  CBC:  Recent Labs Lab 07/28/12 0939 07/29/12 0520 07/30/12 0425 07/31/12 0600 08/01/12 0615  WBC 4.0 3.6* 4.1 3.5* 3.4*  HGB 13.4 12.5* 12.3* 12.0* 12.4*  HCT 39.8 37.4* 36.8* 36.6* 38.0*  MCV 78.2 79.1 79.0 79.6 79.8  PLT 168 156 165 175 201    INR:  Recent Labs Lab 07/28/12 0939 07/29/12 0520 07/30/12 0425 07/31/12 0600 08/01/12 0615  INR 1.20 1.30 1.33 1.34 1.28    Other results:  EKG:   Imaging: No results found.   Medications:     Scheduled Medications: . amLODipine  5 mg Oral Daily  . aspirin EC  81 mg Oral Daily  . carvedilol  25 mg Oral BID WC  . ferrous gluconate  324 mg  Oral Q breakfast  . hydrALAZINE  100 mg Oral TID  . losartan  100 mg Oral Daily  . magnesium oxide  800 mg Oral BID  . pantoprazole  40 mg Oral Daily  . warfarin  7.5 mg Oral ONCE-1800  . Warfarin - Pharmacist Dosing Inpatient   Does not apply q1800    Infusions: . sodium chloride Stopped (07/28/12 2130)  . 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 However LDH remains elevated and has plateaued. MAP still remains elevated with addition and titration of anti- hypertensives. Will likely need pump exchange. Will add integrelin for 24-48 hours and see.  INR remains subtherapeutic and having trouble getting it to goal. Increase coumadin.  Continue to ambulate. Titrate amlodipine to 10 daily.   I reviewed the LVAD parameters from today, and compared the results to the patient's prior recorded data.  No programming changes were made.  The LVAD is functioning within specified parameters.  The patient performs LVAD self-test daily.  LVAD interrogation was negative for any significant power changes, alarms or PI events/speed drops.  LVAD equipment check completed and is in good working order.  Back-up equipment present.   LVAD education done on emergency procedures and precautions and reviewed exit site care.  Length of Stay: 4  Farrah Skoda,MD 12:02 PM

## 2012-08-01 NOTE — Progress Notes (Addendum)
ANTICOAGULATION CONSULT NOTE - Follow Up Consult  Pharmacy Consult for Heparin, Coumadin Indication: LVAD, possible pump thrombosis  Allergies  Allergen Reactions  . Lexapro (Escitalopram Oxalate) Other (See Comments)    somnolence    Patient Measurements: Height: 5\' 4"  (162.6 cm) Weight: 166 lb 8 oz (75.524 kg) IBW/kg (Calculated) : 59.2 Heparin Dosing Weight:   Vital Signs: Temp: 98.3 F (36.8 C) (02/08 0531) Temp src: Oral (02/08 0531) Pulse Rate: 86 (02/08 0531)  Labs:  Recent Labs  07/30/12 0425  07/31/12 0600 07/31/12 1038 08/01/12 0615  HGB 12.3*  --  12.0*  --  12.4*  HCT 36.8*  --  36.6*  --  38.0*  PLT 165  --  175  --  201  LABPROT 16.2*  --  16.3*  --  15.7*  INR 1.33  --  1.34  --  1.28  HEPARINUNFRC 0.50  < > 0.62 0.76* 0.77*  CREATININE 1.07  --  1.08  --  1.18  < > = values in this interval not displayed.  Estimated Creatinine Clearance: 70.4 ml/min (by C-G formula based on Cr of 1.18).   Medications:  Scheduled:  . amLODipine  5 mg Oral Daily  . aspirin EC  81 mg Oral Daily  . carvedilol  25 mg Oral BID WC  . ferrous gluconate  324 mg Oral Q breakfast  . hydrALAZINE  100 mg Oral TID  . losartan  100 mg Oral Daily  . magnesium oxide  800 mg Oral BID  . pantoprazole  40 mg Oral Daily  . [COMPLETED] warfarin  7.5 mg Oral ONCE-1800  . Warfarin - Pharmacist Dosing Inpatient   Does not apply q1800    Assessment: 52yo male s/p LVAD with possible pump thrombosis.  INR is down again, dose increased yesterday.  Heparin level at goal on current rate.  Hg 12.4 with no bleeding problems.  Goal of Therapy:  INR 2-3 and Heparin level 0.6-0.8 (per Dr. Gala Romney) Monitor platelets by anticoagulation protocol: Yes   Plan:  1.  Continue Heparin 1250 units/hr 2.  Repeat Coumadin 7.5mg  3.  F/U in AM  Marisue Humble, PharmD Clinical Pharmacist [facility]   Added Integrilin for LVAD thrombosis  Dose at Integrilin 2  mcg/kg/min  Thank you. Okey Regal, PharmD [phone]

## 2012-08-01 NOTE — Progress Notes (Signed)
Changed LVAD dressing per MD order per hospital protocol. Patient tolerated well, will continue to monitor. Lajuana Matte, RN

## 2012-08-02 LAB — BASIC METABOLIC PANEL
CO2: 23 mEq/L (ref 19–32)
Calcium: 9.5 mg/dL (ref 8.4–10.5)
Glucose, Bld: 93 mg/dL (ref 70–99)
Potassium: 4.2 mEq/L (ref 3.5–5.1)
Sodium: 137 mEq/L (ref 135–145)

## 2012-08-02 LAB — HEPARIN LEVEL (UNFRACTIONATED): Heparin Unfractionated: 0.67 IU/mL (ref 0.30–0.70)

## 2012-08-02 LAB — CBC
Hemoglobin: 12.6 g/dL — ABNORMAL LOW (ref 13.0–17.0)
MCH: 26.1 pg (ref 26.0–34.0)
MCHC: 32.8 g/dL (ref 30.0–36.0)

## 2012-08-02 MED ORDER — CLONIDINE HCL 0.1 MG PO TABS
0.1000 mg | ORAL_TABLET | Freq: Two times a day (BID) | ORAL | Status: DC
  Administered 2012-08-02 – 2012-08-04 (×5): 0.1 mg via ORAL
  Filled 2012-08-02 (×6): qty 1

## 2012-08-02 MED ORDER — WARFARIN SODIUM 7.5 MG PO TABS
7.5000 mg | ORAL_TABLET | Freq: Once | ORAL | Status: AC
  Administered 2012-08-02: 7.5 mg via ORAL
  Filled 2012-08-02: qty 1

## 2012-08-02 NOTE — Progress Notes (Signed)
Changed LVAD dressing per MD order per hospital protocol. Patient tolerated well. Will continue to monitor. Lajuana Matte, RN

## 2012-08-02 NOTE — Progress Notes (Signed)
ANTICOAGULATION CONSULT NOTE - Follow Up Consult  Pharmacy Consult for Heparin, Coumadin, Integrilin Indication: LVAD, possible pump thrombosis  Allergies  Allergen Reactions  . Lexapro (Escitalopram Oxalate) Other (See Comments)    somnolence   Labs:  Recent Labs  07/31/12 0600 07/31/12 1038 08/01/12 0615 08/02/12 0521  HGB 12.0*  --  12.4* 12.6*  HCT 36.6*  --  38.0* 38.4*  PLT 175  --  201 232  LABPROT 16.3*  --  15.7* 18.1*  INR 1.34  --  1.28 1.55*  HEPARINUNFRC 0.62 0.76* 0.77* 0.67  CREATININE 1.08  --  1.18 1.09    Estimated Creatinine Clearance: 75.8 ml/min (by C-G formula based on Cr of 1.09).    Assessment: 52 yo male s/p LVAD with possible pump thrombosis. INR = 1.55 (increasing)  Added Integrilin 2/8 with therapeutic heparin level at 0.67 (lower end of goal)  CBC and Platelets stable, Renal function stable  Goal of Therapy:  INR 2-3 and Heparin level 0.6-0.8 (per Dr. Gala Romney) Monitor platelets by anticoagulation protocol: Yes   Plan:  1.  Continue Heparin 1250 units/hr 2.  Repeat Coumadin 7.5mg  po x 1 dose at 1800 pm 3.  Continue Integrilin at 2 mcg / kg / min 4.  F/U in AM  Thank you. Okey Regal, PharmD [phone]

## 2012-08-02 NOTE — Progress Notes (Signed)
HeartMate 2 Rounding Note  Subjective:    [name] is 52 y/o [place] American male with a history of severe CHF due to NICM EF 10-15%, and severe MR previously on home milrinone. He had a HM II LVAD placed at [facility] March 2013. His medical history also consists of CRI, NSVT s/p ICD and LV thrombus.   Mr. Woolford is followed closely by [facility] and the Heart Failure Clinic. In 02/2012 he was admitted to the hospital for LVAD pump thrombosis and hemolysis with bilirubinuria.   Urine has cleared but LDH plateaued yesterday so Integrilin added to heparin. LDH down a bit today. No bleeding.  MAPs coming down on Losartan, hydralazine and norvasc but still ~ 100 (was 106 this am) Ambulating in the halls and feels good. Denies SOB/orthopnea or edema. INR remains sub-therapeutic.  LDH 1234 ->967>941> 161>096>045 INR 1.34>1.28>1.55  LVAD INTERROGATION:  HeartMate II LVAD:  Flow 5.1 liters/min, speed 9400, power 6.3 PI 6.1 Numerous PI events.   Objective:    Vital Signs:   Temp:  [97.7 F (36.5 C)-98.5 F (36.9 C)] 98.5 F (36.9 C) (02/09 0328) Pulse Rate:  [83-92] 83 (02/09 0743) Resp:  [16-18] 18 (02/09 0743) SpO2:  [93 %-98 %] 93 % (02/09 0743) Weight:  [74.753 kg (164 lb 12.8 oz)] 74.753 kg (164 lb 12.8 oz) (02/09 0500) Last BM Date: 08/02/12 Mean arterial Pressure 118 Intake/Output:   Intake/Output Summary (Last 24 hours) at 08/02/12 0820 Last data filed at 08/02/12 0300  Gross per 24 hour  Intake 1124.04 ml  Output    500 ml  Net 624.04 ml     Physical Exam: General:  Well appearing. No resp difficulty HEENT: normal Neck: supple. JVP flat. Carotids 2+ bilat; no bruits. No lymphadenopathy or thryomegaly appreciated. Cor: Mechanical heart sounds with LVAD hum present. Lungs: clear Abdomen: soft, nontender, nondistended. No hepatosplenomegaly. No bruits or masses. Good bowel sounds. Driveline dressing C/D/I with securment device in place Extremities: no cyanosis, clubbing, rash,  edema Neuro: alert & orientedx3, cranial nerves grossly intact. moves all 4 extremities w/o difficulty. Affect pleasant  Telemetry: SR 80 bpm  Labs: Basic Metabolic Panel:  Recent Labs Lab 07/28/12 0939 07/29/12 0520 07/30/12 0425 07/31/12 0600 08/01/12 0615 08/02/12 0521  NA 138  --  137 135 135 137  K 3.8  --  4.3 4.3 4.6 4.2  CL 99  --  104 102 101 101  CO2 26  --  25 25 26 23   GLUCOSE 95  --  106* 92 102* 93  BUN 21  --  14 12 11 13   CREATININE 1.27  --  1.07 1.08 1.18 1.09  CALCIUM 9.8  --  8.7 8.9 9.4 9.5  MG 2.1 2.2  --   --   --   --     Liver Function Tests:  Recent Labs Lab 07/28/12 0939  AST 55*  ALT 16  ALKPHOS 98  BILITOT 1.9*  PROT 8.2  ALBUMIN 4.0   No results found for this basename: LIPASE, AMYLASE,  in the last 168 hours No results found for this basename: AMMONIA,  in the last 168 hours  CBC:  Recent Labs Lab 07/29/12 0520 07/30/12 0425 07/31/12 0600 08/01/12 0615 08/02/12 0521  WBC 3.6* 4.1 3.5* 3.4* 3.5*  HGB 12.5* 12.3* 12.0* 12.4* 12.6*  HCT 37.4* 36.8* 36.6* 38.0* 38.4*  MCV 79.1 79.0 79.6 79.8 79.7  PLT 156 165 175 201 232    INR:  Recent Labs Lab 07/29/12 0520  07/30/12 0425 07/31/12 0600 08/01/12 0615 08/02/12 0521  INR 1.30 1.33 1.34 1.28 1.55*    Other results:  EKG:   Imaging: No results found.   Medications:     Scheduled Medications: . amLODipine  10 mg Oral Daily  . aspirin EC  81 mg Oral Daily  . carvedilol  25 mg Oral BID WC  . ferrous gluconate  324 mg Oral Q breakfast  . hydrALAZINE  100 mg Oral TID  . losartan  100 mg Oral Daily  . magnesium oxide  800 mg Oral BID  . pantoprazole  40 mg Oral Daily  . Warfarin - Pharmacist Dosing Inpatient   Does not apply q1800    Infusions: . sodium chloride Stopped (07/28/12 2130)  . eptifibatide 2 mcg/kg/min (08/02/12 0304)  . 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 LDH beginning to drop again on heparin and Integrilin. Will continue to follow. If plateaus again will likely need pump exchange. INR coming up. MAPs improving but still high. Will add low dose clonidine.   I reviewed the LVAD parameters from today, and compared the results to the patient's prior recorded data.  No programming changes were made.  The LVAD is functioning within specified parameters.  The patient performs LVAD self-test daily.  LVAD interrogation was negative for any significant power changes, alarms or PI events/speed drops.  LVAD equipment check completed and is in good working order.  Back-up equipment present.   LVAD education done on emergency procedures and precautions and reviewed exit site care.  Length of Stay: 5  Daniel Bensimhon,MD 8:20 AM

## 2012-08-03 ENCOUNTER — Inpatient Hospital Stay (HOSPITAL_COMMUNITY): Payer: Medicare Other

## 2012-08-03 LAB — HEPARIN LEVEL (UNFRACTIONATED): Heparin Unfractionated: 0.47 IU/mL (ref 0.30–0.70)

## 2012-08-03 LAB — LACTATE DEHYDROGENASE: LDH: 732 U/L — ABNORMAL HIGH (ref 94–250)

## 2012-08-03 LAB — CBC
HCT: 38.3 % — ABNORMAL LOW (ref 39.0–52.0)
Hemoglobin: 12.6 g/dL — ABNORMAL LOW (ref 13.0–17.0)
MCH: 26.1 pg (ref 26.0–34.0)
MCV: 79.3 fL (ref 78.0–100.0)
RBC: 4.83 MIL/uL (ref 4.22–5.81)

## 2012-08-03 LAB — BASIC METABOLIC PANEL
CO2: 27 mEq/L (ref 19–32)
Chloride: 99 mEq/L (ref 96–112)
Sodium: 135 mEq/L (ref 135–145)

## 2012-08-03 MED ORDER — WARFARIN SODIUM 7.5 MG PO TABS
7.5000 mg | ORAL_TABLET | Freq: Once | ORAL | Status: AC
  Administered 2012-08-03: 7.5 mg via ORAL
  Filled 2012-08-03: qty 1

## 2012-08-03 MED ORDER — AMINOCAPROIC ACID SOLUTION 5% (50 MG/ML)
5.0000 mL | ORAL | Status: DC | PRN
  Administered 2012-08-03 (×3): 5 mL via ORAL
  Filled 2012-08-03 (×2): qty 100

## 2012-08-03 MED ORDER — HEPARIN (PORCINE) IN NACL 100-0.45 UNIT/ML-% IJ SOLN
1150.0000 [IU]/h | INTRAMUSCULAR | Status: DC
  Administered 2012-08-03 – 2012-08-04 (×2): 1150 [IU]/h via INTRAVENOUS
  Filled 2012-08-03 (×4): qty 250

## 2012-08-03 MED ORDER — SODIUM CHLORIDE 0.9 % IJ SOLN
10.0000 mL | INTRAMUSCULAR | Status: DC | PRN
  Administered 2012-08-03 – 2012-08-07 (×3): 10 mL
  Administered 2012-08-07: 20 mL

## 2012-08-03 NOTE — Progress Notes (Signed)
Peripherally Inserted Central Catheter/Midline Placement  The IV Nurse has discussed with the patient and/or persons authorized to consent for the patient, the purpose of this procedure and the potential benefits and risks involved with this procedure.  The benefits include less needle sticks, lab draws from the catheter and patient may be discharged home with the catheter.  Risks include, but not limited to, infection, bleeding, blood clot (thrombus formation), and puncture of an artery; nerve damage and irregular heat beat.  Alternatives to this procedure were also discussed. Patient stated had previous PICC and asked not to review risks/benefits.  PICC/Midline Placement Documentation        Mellissa Kohut 08/03/2012, 1:11 PM

## 2012-08-03 NOTE — Progress Notes (Signed)
ANTICOAGULATION CONSULT NOTE - Follow Up Consult  Pharmacy Consult for Heparin, Coumadin, Integrilin Indication: LVAD, possible pump thrombosis  Allergies  Allergen Reactions  . Lexapro (Escitalopram Oxalate) Other (See Comments)    somnolence   Labs:  Recent Labs  08/01/12 0615 08/02/12 0521 08/03/12 0545  HGB 12.4* 12.6* 12.6*  HCT 38.0* 38.4* 38.3*  PLT 201 232 245  LABPROT 15.7* 18.1* 20.8*  INR 1.28 1.55* 1.87*  HEPARINUNFRC 0.77* 0.67 0.86*  CREATININE 1.18 1.09 1.06    Estimated Creatinine Clearance: 78.5 ml/min (by C-G formula based on Cr of 1.06).  Assessment: 52 yo male s/p LVAD with possible pump thrombosis. INR = 1.87 (increasing).  On heparin at 1250 units/hr; and Integrilin at 2 mcg/kg/min.  Heparin level above goal this morning.   CBC and Platelets stable, Renal function stable  Patient experiencing bleeding gums this morning.  Amicar mouthwash started. Spoke with Dr. Gala Romney, since patient has decreasing LDH, and now on Integrilin as well, will target heparin level goal 0.3-0.5.  Goal of Therapy:  INR 2-3 and Heparin level 0.3-0.5 Monitor platelets by anticoagulation protocol: Yes   Plan:  1.  Decrease IV heparin to 1150 units/hr. 2. Check heparin level 6 hrs after rate adjusted. 3. Continue Integrilin at 2 mcg/kg/min 4. Coumadin 7.5 mg po x 1 tonight. 5. Daily heparin level, CBC, and INR.  Tad Moore, BCPS  Clinical Pharmacist Pager [phone]  08/03/2012 10:41 AM

## 2012-08-03 NOTE — Progress Notes (Signed)
ANTICOAGULATION CONSULT NOTE - Follow Up Consult  Pharmacy Consult for Heparin, Coumadin, Integrilin Indication: LVAD, possible pump thrombosis  Allergies  Allergen Reactions  . Lexapro (Escitalopram Oxalate) Other (See Comments)    somnolence   Labs:  Recent Labs  08/01/12 0615 08/02/12 0521 08/03/12 0545 08/03/12 1937  HGB 12.4* 12.6* 12.6*  --   HCT 38.0* 38.4* 38.3*  --   PLT 201 232 245  --   LABPROT 15.7* 18.1* 20.8*  --   INR 1.28 1.55* 1.87*  --   HEPARINUNFRC 0.77* 0.67 0.86* 0.47  CREATININE 1.18 1.09 1.06  --     Estimated Creatinine Clearance: 78.5 ml/min (by C-G formula based on Cr of 1.06).  Assessment: 52 yo male s/p LVAD with possible pump thrombosis. INR = 1.87 (increasing).  On heparin at 1150 units/hr; and Integrilin at 2 mcg/kg/min.  Heparin level within goal after rate decrease.   CBC and Platelets stable, Renal function stable  Patient experiencing bleeding gums this morning.  Amicar mouthwash started. Spoke with Dr. Gala Romney, since patient has decreasing LDH, and now on Integrilin as well, will target heparin level goal 0.3-0.5.  Goal of Therapy:  INR 2-3 and Heparin level 0.3-0.5 Monitor platelets by anticoagulation protocol: Yes   Plan:  1. Continue IV heparin at 1150 units/hr. 2. Obtain a confirmation heparin level tomorrow morning.   Thank you,  Brett Fairy, PharmD, BCPS 08/03/2012 8:35 PM

## 2012-08-03 NOTE — Progress Notes (Signed)
LVAD dressing changed per protocol. Site clean dry and intact. [name]

## 2012-08-03 NOTE — Care Management Note (Unsigned)
    Page 1 of 1   08/03/2012     4:14:08 PM   CARE MANAGEMENT NOTE 08/03/2012  Patient:  [name], [name]   Account Number:  [id number]  Date Initiated:  08/03/2012  Documentation initiated by:  Melisse Caetano  Subjective/Objective Assessment:   PT ADM WITH LVAD PUMP THROMBOSIS ON 07/28/12.  PTA, PT INDEPENDENT OF ADLS.  HE LIVES WITH MULTIPLE FAMILY MEMBERS.     Action/Plan:   WILL FOLLOW FOR HOME NEEDS AS PT PROGRESSES.  LVAD PLACED AT [facility]; WILL HAVE FOLLOW UP CARE THERE.   Anticipated DC Date:  08/06/2012   Anticipated DC Plan:  HOME W HOME HEALTH SERVICES      DC Planning Services  CM consult      Choice offered to / List presented to:             Status of service:  In process, will continue to follow Medicare Important Message given?   (If response is "NO", the following Medicare IM given date fields will be blank) Date Medicare IM given:   Date Additional Medicare IM given:    Discharge Disposition:    Per UR Regulation:  Reviewed for med. necessity/level of care/duration of stay  If discussed at Long Length of Stay Meetings, dates discussed:    Comments:

## 2012-08-03 NOTE — Progress Notes (Signed)
HeartMate 2 Rounding Note  Subjective:    Milind is 52 y/o [place] American male with a history of severe CHF due to NICM EF 10-15%, and severe MR previously on home milrinone. He had a HM II LVAD placed at [facility] March 2013. His medical history also consists of CRI, NSVT s/p ICD and LV thrombus.   Mr. Pettijohn is followed closely by [facility] and the [facility]. In 02/2012 he was admitted to the hospital for LVAD pump thrombosis and hemolysis with bilirubinuria.   Urine has cleared but LDH plateaued so Integrilin added to heparin on 2/8. LDH now falling again. No bleeding.  MAPs coming down on Losartan, hydralazine and norvasc. MAPs 78-100s. Patient continues to ambulate in the halls and feels good. Denies SOB/orthopnea or edema. INR remains subtherapeutic, but slightly rising this am 1.87. Integrellin started on 08/01/12 and continues to remain on along with heparin. He reports that he is having minimal bleeding of his gums since last night.   LDH 1234 ->967>941> [phone] INR 1.34>1.28>1.55>1.87  LVAD INTERROGATION:  HeartMate II LVAD:  Flow 4.8 liters/min, speed 9400, power 6.3 PI 6.0 Numerous PI events.   Objective:    Vital Signs:   Temp:  [98.1 F (36.7 C)-98.4 F (36.9 C)] 98.2 F (36.8 C) (02/10 0527) Resp:  [18-20] 18 (02/10 0539) SpO2:  [94 %-95 %] 95 % (02/10 0539) Weight:  [75.66 kg (166 lb 12.8 oz)] 75.66 kg (166 lb 12.8 oz) (02/10 0527) Last BM Date: 08/02/12 Mean arterial Pressure 94 Intake/Output:   Intake/Output Summary (Last 24 hours) at 08/03/12 1158 Last data filed at 08/03/12 0800  Gross per 24 hour  Intake 1482.6 ml  Output    700 ml  Net  782.6 ml     Physical Exam: General:  Well appearing. No resp difficulty HEENT: normal Neck: supple. JVP flat. Carotids 2+ bilat; no bruits. No lymphadenopathy or thryomegaly appreciated. Cor: Mechanical heart sounds with LVAD hum present. Lungs: clear Abdomen: soft, nontender, nondistended. No  hepatosplenomegaly. No bruits or masses. Good bowel sounds. Driveline dressing C/D/I with securment device in place Extremities: no cyanosis, clubbing, rash, edema Neuro: alert & orientedx3, cranial nerves grossly intact. moves all 4 extremities w/o difficulty. Affect pleasant  Telemetry: SR 74 bpm  Labs: Basic Metabolic Panel:  Recent Labs Lab 07/28/12 0939 07/29/12 0520 07/30/12 0425 07/31/12 0600 08/01/12 0615 08/02/12 0521 08/03/12 0545  NA 138  --  137 135 135 137 135  K 3.8  --  4.3 4.3 4.6 4.2 3.8  CL 99  --  104 102 101 101 99  CO2 26  --  25 25 26 23 27   GLUCOSE 95  --  106* 92 102* 93 95  BUN 21  --  14 12 11 13 13   CREATININE 1.27  --  1.07 1.08 1.18 1.09 1.06  CALCIUM 9.8  --  8.7 8.9 9.4 9.5 9.3  MG 2.1 2.2  --   --   --   --   --     Liver Function Tests:  Recent Labs Lab 07/28/12 0939  AST 55*  ALT 16  ALKPHOS 98  BILITOT 1.9*  PROT 8.2  ALBUMIN 4.0   No results found for this basename: LIPASE, AMYLASE,  in the last 168 hours No results found for this basename: AMMONIA,  in the last 168 hours  CBC:  Recent Labs Lab 07/30/12 0425 07/31/12 0600 08/01/12 0615 08/02/12 0521 08/03/12 0545  WBC 4.1 3.5* 3.4* 3.5* 3.2*  HGB 12.3* 12.0* 12.4* 12.6* 12.6*  HCT 36.8* 36.6* 38.0* 38.4* 38.3*  MCV 79.0 79.6 79.8 79.7 79.3  PLT 165 175 201 232 245    INR:  Recent Labs Lab 07/30/12 0425 07/31/12 0600 08/01/12 0615 08/02/12 0521 08/03/12 0545  INR 1.33 1.34 1.28 1.55* 1.87*    Other results:    Imaging: No results found.   Medications:     Scheduled Medications: . amLODipine  10 mg Oral Daily  . aspirin EC  81 mg Oral Daily  . carvedilol  25 mg Oral BID WC  . cloNIDine  0.1 mg Oral BID  . ferrous gluconate  324 mg Oral Q breakfast  . hydrALAZINE  100 mg Oral TID  . losartan  100 mg Oral Daily  . magnesium oxide  800 mg Oral BID  . pantoprazole  40 mg Oral Daily  . warfarin  7.5 mg Oral ONCE-1800  . Warfarin - Pharmacist  Dosing Inpatient   Does not apply q1800    Infusions: . sodium chloride Stopped (07/28/12 2130)  . eptifibatide 2 mcg/kg/min (08/03/12 0749)  . heparin 1,150 Units/hr (08/03/12 0859)    PRN Medications: acetaminophen, aminocaproic acid, docusate sodium   Assessment:   1) Probable acute hemolysis due to LVAD pump thrombosis  2) Chronic Systolic HF  -EF 29-56%  3) NICM  -s/p LVAD 08/2011  4) NSVT  5) Hypertension, uncontrolled     Plan/Discussion:    Stable overnight and reports he feels good. Starting to have some gingival bleeding on asa, heparin, coumadin and integrilin. His INR is slowly increasing close to therapeutic range, this am 1.84. LDH continuing to decrease slowly, 732. MAPs in the 70-low 100s, will continue to monitor. Will order Amicar swish and spit. Continue Integrilin one more day.  I reviewed the LVAD parameters from today, and compared the results to the patient's prior recorded data. The pump speed was decreased from 9400 to 9200 because of numerous PI events. The LVAD is functioning within specified parameters.  The patient performs LVAD self-test daily.  LVAD interrogation  was negative for any significant power changes or alarms. Numerous PI events noted.  LVAD equipment check completed and is in good working order.  Back-up equipment present.   LVAD education done on emergency procedures and precautions and reviewed exit site care.  Length of Stay: 6 Asiel Chrostowski,MD 12:05 PM

## 2012-08-04 DIAGNOSIS — R570 Cardiogenic shock: Secondary | ICD-10-CM

## 2012-08-04 LAB — PROTIME-INR
INR: 2.06 — ABNORMAL HIGH (ref 0.00–1.49)
Prothrombin Time: 22.4 seconds — ABNORMAL HIGH (ref 11.6–15.2)

## 2012-08-04 LAB — HEPARIN LEVEL (UNFRACTIONATED): Heparin Unfractionated: 0.58 IU/mL (ref 0.30–0.70)

## 2012-08-04 LAB — BASIC METABOLIC PANEL
Calcium: 8.9 mg/dL (ref 8.4–10.5)
Chloride: 102 mEq/L (ref 96–112)
Creatinine, Ser: 1.17 mg/dL (ref 0.50–1.35)
GFR calc Af Amer: 83 mL/min — ABNORMAL LOW (ref >90)
GFR calc non Af Amer: 72 mL/min — ABNORMAL LOW (ref >90)

## 2012-08-04 LAB — CBC
HCT: 34.1 % — ABNORMAL LOW (ref 39.0–52.0)
Platelets: 256 10*3/uL (ref 150–400)
RDW: 18.5 % — ABNORMAL HIGH (ref 11.5–15.5)
WBC: 2.9 10*3/uL — ABNORMAL LOW (ref 4.0–10.5)

## 2012-08-04 LAB — LACTATE DEHYDROGENASE: LDH: 609 U/L — ABNORMAL HIGH (ref 94–250)

## 2012-08-04 MED ORDER — WARFARIN SODIUM 7.5 MG PO TABS
7.5000 mg | ORAL_TABLET | Freq: Once | ORAL | Status: AC
  Administered 2012-08-04: 7.5 mg via ORAL
  Filled 2012-08-04: qty 1

## 2012-08-04 NOTE — Progress Notes (Signed)
HeartMate 2 Rounding Note  Subjective:    Matson is 52 y/o [place] American male with a history of severe CHF due to NICM EF 10-15%, and severe MR previously on home milrinone. He had a HM II LVAD placed at [facility] March 2013. His medical history also consists of CRI, NSVT s/p ICD and LV thrombus.   Mr. Wieck is followed closely by [facility] and the [facility] Failure Clinic. In 02/2012 he was admitted to the hospital for LVAD pump thrombosis and hemolysis with bilirubinuria.   Urine has cleared but LDH plateaued so Integrilin added to heparin on 2/8. LDH now falling again.   MAPs coming down on Losartan, hydralazine, clonidine and norvasc. MAPs 60s-80s. Reports feeling good and is ambulating in the halls. Denies orthopnea, CP, edema or SOB. He was having gingival bleeding yesterday and amicar mouth swish ordered and now has stopped.   LDH 1234 ->967>941> 897>893>843>732>609 INR 1.34>1.28>1.55>1.87 (waiting on INR)  LVAD INTERROGATION:  HeartMate II LVAD:  Flow 4.8 liters/min, speed 9200, power 6.1 PI 6.0 Numerous PI events on new speed.   Objective:    Vital Signs:   Temp:  [98.1 F (36.7 C)-98.2 F (36.8 C)] 98.2 F (36.8 C) (02/11 0440) Resp:  [16-18] 16 (02/11 0440) SpO2:  [95 %] 95 % (02/11 0440) Weight:  [75.6 kg (166 lb 10.7 oz)] 75.6 kg (166 lb 10.7 oz) (02/11 0440) Last BM Date: 08/03/12 Mean arterial Pressure 94 Intake/Output:   Intake/Output Summary (Last 24 hours) at 08/04/12 0920 Last data filed at 08/04/12 0800  Gross per 24 hour  Intake 830.09 ml  Output    500 ml  Net 330.09 ml     Physical Exam: General:  Well appearing. No resp difficulty HEENT: normal Neck: supple. JVP flat. Carotids 2+ bilat; no bruits. No lymphadenopathy or thryomegaly appreciated. Cor: Mechanical heart sounds with LVAD hum present. Lungs: clear Abdomen: soft, nontender, nondistended. No hepatosplenomegaly. No bruits or masses. Good bowel sounds. Driveline dressing C/D/I with securment device in  place Extremities: no cyanosis, clubbing, rash, edema Neuro: alert & orientedx3, cranial nerves grossly intact. moves all 4 extremities w/o difficulty. Affect pleasant  Telemetry: SR 74 bpm  Labs: Basic Metabolic Panel:  Recent Labs Lab 07/28/12 0939 07/29/12 0520  07/31/12 0600 08/01/12 0615 08/02/12 0521 08/03/12 0545 08/04/12 0500  NA 138  --   < > 135 135 137 135 138  K 3.8  --   < > 4.3 4.6 4.2 3.8 4.0  CL 99  --   < > 102 101 101 99 102  CO2 26  --   < > 25 26 23 27 28   GLUCOSE 95  --   < > 92 102* 93 95 94  BUN 21  --   < > 12 11 13 13 14   CREATININE 1.27  --   < > 1.08 1.18 1.09 1.06 1.17  CALCIUM 9.8  --   < > 8.9 9.4 9.5 9.3 8.9  MG 2.1 2.2  --   --   --   --   --   --   < > = values in this interval not displayed.  Liver Function Tests:  Recent Labs Lab 07/28/12 0939  AST 55*  ALT 16  ALKPHOS 98  BILITOT 1.9*  PROT 8.2  ALBUMIN 4.0   No results found for this basename: LIPASE, AMYLASE,  in the last 168 hours No results found for this basename: AMMONIA,  in the last 168 hours  CBC:  Recent Labs Lab 07/31/12 0600 08/01/12 0615 08/02/12 0521 08/03/12 0545 08/04/12 0500  WBC 3.5* 3.4* 3.5* 3.2* 2.9*  HGB 12.0* 12.4* 12.6* 12.6* 11.5*  HCT 36.6* 38.0* 38.4* 38.3* 34.1*  MCV 79.6 79.8 79.7 79.3 79.3  PLT 175 201 232 245 256    INR:  Recent Labs Lab 07/30/12 0425 07/31/12 0600 08/01/12 0615 08/02/12 0521 08/03/12 0545  INR 1.33 1.34 1.28 1.55* 1.87*    Other results:    Imaging: Dg Chest Port 1 View  08/03/2012  *RADIOLOGY REPORT*  Clinical Data: Confirm line placement.  PORTABLE CHEST - 1 VIEW  Comparison: 02/27/2012  Findings: Left AICD and ventricular assist device again noted, unchanged.  Right PICC line has been placed.  The tip is in the SVC near the cavoatrial junction.  Cardiomegaly.  Lungs are clear.  No effusions or acute bony abnormality.  IMPRESSION: The right PICC line tip in the SVC near the cavoatrial junction.    Original Report Authenticated By: Charlett Nose, M.D.      Medications:     Scheduled Medications: . amLODipine  10 mg Oral Daily  . aspirin EC  81 mg Oral Daily  . carvedilol  25 mg Oral BID WC  . cloNIDine  0.1 mg Oral BID  . ferrous gluconate  324 mg Oral Q breakfast  . hydrALAZINE  100 mg Oral TID  . losartan  100 mg Oral Daily  . magnesium oxide  800 mg Oral BID  . pantoprazole  40 mg Oral Daily  . Warfarin - Pharmacist Dosing Inpatient   Does not apply q1800    Infusions: . sodium chloride Stopped (07/28/12 2130)  . 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 Continue heparin and warfarin. With decrease in MAPs will cut back on anti-hypertensives. Waiting INR. Continues to have numerous PI events with change in pump speed, however denies any dizziness. Will continue to monitor and may consider decreasing speed back to 9000.   I reviewed the LVAD parameters from today, and compared the results to the patient's prior recorded data. The pump speed is now 9200. The LVAD is functioning within specified parameters.  The patient performs LVAD self-test daily.  LVAD interrogation  was negative for any significant power changes or alarms. Numerous PI events noted.  LVAD equipment check completed and is in good working order.  Back-up equipment present.   LVAD education done on emergency procedures and precautions and reviewed exit site care.  Length of Stay: 7 Layton Naves,MD 9:20 AM

## 2012-08-04 NOTE — Progress Notes (Addendum)
ANTICOAGULATION CONSULT NOTE - Follow Up Consult  Pharmacy Consult for Heparin & Coumadin Indication: LVAD, possible pump thrombosis  Allergies  Allergen Reactions  . Lexapro (Escitalopram Oxalate) Other (See Comments)    somnolence   Labs:  Recent Labs  08/02/12 0521 08/03/12 0545 08/03/12 1937 08/04/12 0500 08/04/12 0600  HGB 12.6* 12.6*  --  11.5*  --   HCT 38.4* 38.3*  --  34.1*  --   PLT 232 245  --  256  --   LABPROT 18.1* 20.8*  --   --  22.4*  INR 1.55* 1.87*  --   --  2.06*  HEPARINUNFRC 0.67 0.86* 0.47  --  0.58  CREATININE 1.09 1.06  --  1.17  --     Estimated Creatinine Clearance: 71.1 ml/min (by C-G formula based on Cr of 1.17).  Assessment: 52 yo male s/p LVAD with possible pump thrombosis.  INR therapeutic today at 2.06 (1.87 yesterday). Per Dr. Gala Romney, will continue IV heparin today to ensure he remains therapeutic. Heparin level this morning is 0.58 at rate of 1150 units/hr. HL goal had been lowered due to patient being on integrelin -- which has now been discontinued. Per Dr. Gala Romney, ok for goal to be a little higher now.  Platelets stable, Hgb down to 11.5, but no bleeding noted in chart by MD or by RN (previously had some gum bleeding, now resolved s/p amicar oral solution).  Goal of Therapy:  INR 2-3 Heparin level 0.3-0.6 Monitor platelets by anticoagulation protocol: Yes   Plan:  1.  Continue IV heparin at 1150 units/hr. 2.  Coumadin 7.5mg  x 1 tonight 3.  Follow-up daily heparin level, INR, CBC, signs/symptoms of bleeding.  Benjaman Pott, PharmD, BCPS 08/04/2012   2:51 PM

## 2012-08-04 NOTE — Progress Notes (Signed)
LVAD dressing changed per protocol. Site WNL. Pt tolerated well. [name]

## 2012-08-05 LAB — BASIC METABOLIC PANEL
BUN: 13 mg/dL (ref 6–23)
CO2: 28 mEq/L (ref 19–32)
Chloride: 103 mEq/L (ref 96–112)
Creatinine, Ser: 1.07 mg/dL (ref 0.50–1.35)
Glucose, Bld: 99 mg/dL (ref 70–99)
Potassium: 4.4 mEq/L (ref 3.5–5.1)

## 2012-08-05 LAB — CBC
MCH: 25.9 pg — ABNORMAL LOW (ref 26.0–34.0)
Platelets: 265 10*3/uL (ref 150–400)
RBC: 4.64 MIL/uL (ref 4.22–5.81)

## 2012-08-05 LAB — HEPARIN LEVEL (UNFRACTIONATED)
Heparin Unfractionated: 0.91 IU/mL — ABNORMAL HIGH (ref 0.30–0.70)
Heparin Unfractionated: 0.63 IU/mL (ref 0.30–0.70)

## 2012-08-05 MED ORDER — WARFARIN SODIUM 7.5 MG PO TABS
7.5000 mg | ORAL_TABLET | Freq: Once | ORAL | Status: AC
  Administered 2012-08-05: 7.5 mg via ORAL
  Filled 2012-08-05: qty 1

## 2012-08-05 MED ORDER — HEPARIN (PORCINE) IN NACL 100-0.45 UNIT/ML-% IJ SOLN
1050.0000 [IU]/h | INTRAMUSCULAR | Status: DC
  Filled 2012-08-05 (×2): qty 250

## 2012-08-05 MED ORDER — HEPARIN (PORCINE) IN NACL 100-0.45 UNIT/ML-% IJ SOLN
950.0000 [IU]/h | INTRAMUSCULAR | Status: DC
  Filled 2012-08-05: qty 250

## 2012-08-05 MED ORDER — HEPARIN (PORCINE) IN NACL 100-0.45 UNIT/ML-% IJ SOLN
900.0000 [IU]/h | INTRAMUSCULAR | Status: DC
  Administered 2012-08-05: 900 [IU]/h via INTRAVENOUS
  Filled 2012-08-05: qty 250

## 2012-08-05 NOTE — Progress Notes (Signed)
HeartMate 2 Rounding Note  Subjective:    [name] is 52 y/o [place] male with a history of severe CHF due to NICM EF 10-15%, and severe MR previously on home milrinone. He had a HM II LVAD placed at [facility] March 2013. His medical history also consists of CRI, NSVT s/p ICD and LV thrombus.   [name] is followed closely by [facility] and the [facility]. In 02/2012 he was admitted to the hospital for LVAD pump thrombosis and hemolysis with bilirubinuria.   Urine has cleared but LDH plateaued so Integrilin added to heparin on 2/8. LDH now falling again.   MAPs now controlled in the 60-80s on Losartan, hydralazine, and norvasc. Clonidine stopped yesterday. Integrellin stopped yesterday.  Denies orthopnea, CP, edema or SOB. Ambulating well in the halls. Weight stable.  LDH 1234 ->967>941> 897>893>843>732>609>555 INR 1.34>1.28>1.55>1.87>2.06>2.25  LVAD INTERROGATION:  HeartMate II LVAD:  Flow 4.5 liters/min, speed 9200, power 5.7 PI 5.7 Numerous PI events on new speed.   Objective:    Vital Signs:   Temp:  [97.7 F (36.5 C)-98.4 F (36.9 C)] 97.7 F (36.5 C) (02/12 1345) Pulse Rate:  [75-95] 80 (02/12 1345) Resp:  [17] 17 (02/11 2032) SpO2:  [100 %] 100 % (02/12 0443) Weight:  [75.6 kg (166 lb 10.7 oz)] 75.6 kg (166 lb 10.7 oz) (02/12 0443) Last BM Date: 08/04/12 Mean arterial Pressure 76 Intake/Output:   Intake/Output Summary (Last 24 hours) at 08/05/12 1645 Last data filed at 08/05/12 1300  Gross per 24 hour  Intake  754.5 ml  Output    501 ml  Net  253.5 ml     Physical Exam: General:  Well appearing. No resp difficulty. Sitting in chair HEENT: normal Neck: supple. JVP flat. Carotids 2+ bilat; no bruits. No lymphadenopathy or thryomegaly appreciated. Cor: Mechanical heart sounds with LVAD hum present. Lungs: clear Abdomen: soft, nontender, nondistended. Good bowel sounds. Driveline dressing C/D/I with securment device in place Extremities: no cyanosis,  clubbing, rash, edema Neuro: alert & orientedx3, cranial nerves grossly intact. moves all 4 extremities w/o difficulty. Affect pleasant  Telemetry: SR 70s  Labs: Basic Metabolic Panel:  Recent Labs Lab 08/01/12 0615 08/02/12 0521 08/03/12 0545 08/04/12 0500 08/05/12 0615  NA 135 137 135 138 137  K 4.6 4.2 3.8 4.0 4.4  CL 101 101 99 102 103  CO2 26 23 27 28 28   GLUCOSE 102* 93 95 94 99  BUN 11 13 13 14 13   CREATININE 1.18 1.09 1.06 1.17 1.07  CALCIUM 9.4 9.5 9.3 8.9 9.0    Liver Function Tests: No results found for this basename: AST, ALT, ALKPHOS, BILITOT, PROT, ALBUMIN,  in the last 168 hours No results found for this basename: LIPASE, AMYLASE,  in the last 168 hours No results found for this basename: AMMONIA,  in the last 168 hours  CBC:  Recent Labs Lab 08/01/12 0615 08/02/12 0521 08/03/12 0545 08/04/12 0500 08/05/12 1030  WBC 3.4* 3.5* 3.2* 2.9* 3.1*  HGB 12.4* 12.6* 12.6* 11.5* 12.0*  HCT 38.0* 38.4* 38.3* 34.1* 36.8*  MCV 79.8 79.7 79.3 79.3 79.3  PLT 201 232 245 256 265    INR:  Recent Labs Lab 08/01/12 0615 08/02/12 0521 08/03/12 0545 08/04/12 0600 08/05/12 0615  INR 1.28 1.55* 1.87* 2.06* 2.25*    Other results:    Imaging: No results found.   Medications:     Scheduled Medications: . amLODipine  10 mg Oral Daily  . aspirin EC  81 mg  Oral Daily  . carvedilol  25 mg Oral BID WC  . ferrous gluconate  324 mg Oral Q breakfast  . hydrALAZINE  100 mg Oral TID  . losartan  100 mg Oral Daily  . magnesium oxide  800 mg Oral BID  . pantoprazole  40 mg Oral Daily  . warfarin  7.5 mg Oral ONCE-1800  . Warfarin - Pharmacist Dosing Inpatient   Does not apply q1800    Infusions: . sodium chloride Stopped (07/28/12 2130)  . 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 INR has been therapeutic x 2 days will continue heparin for one more day and then stop. Transition to coumadin, asa 81 and plavix 75. No gingival bleeding anymore. Continues to have numerous PI events on 9200, however denies dizziness and less frequent then 9400.  I reviewed the LVAD parameters from today, and compared the results to the patient's prior recorded data. The pump speed is now 9200. The LVAD is functioning within specified parameters.  The patient performs LVAD self-test daily.  LVAD interrogation  was negative for any significant power changes or alarms. Numerous PI events noted.  LVAD equipment check completed and is in good working order.  Back-up equipment present.   LVAD education done on emergency procedures and precautions and reviewed exit site care.   Eric Boom Jemimah Cressy,MD 4:47 PM

## 2012-08-05 NOTE — Progress Notes (Signed)
LVAD dressing changed per protocol. Pt tolerated well. [name]  

## 2012-08-05 NOTE — Progress Notes (Signed)
ANTICOAGULATION CONSULT NOTE - Follow Up Consult  Pharmacy Consult for Heparin & Coumadin Indication: LVAD, possible pump thrombosis  Allergies  Allergen Reactions  . Lexapro (Escitalopram Oxalate) Other (See Comments)    somnolence   Labs:  Recent Labs  08/03/12 0545 08/03/12 1937 08/04/12 0500 08/04/12 0600 08/05/12 0615  HGB 12.6*  --  11.5*  --   --   HCT 38.3*  --  34.1*  --   --   PLT 245  --  256  --   --   LABPROT 20.8*  --   --  22.4* 23.9*  INR 1.87*  --   --  2.06* 2.25*  HEPARINUNFRC 0.86* 0.47  --  0.58 0.75*  CREATININE 1.06  --  1.17  --  1.07    Estimated Creatinine Clearance: 77.7 ml/min (by C-G formula based on Cr of 1.07).  Assessment: 52 yo male s/p LVAD with possible pump thrombosis.  INR therapeutic today at 2.25 (2.06 yesterday). Per Dr. Gala Romney, will continue IV heparin today to ensure he remains therapeutic. Heparin level this morning is 0.75 at rate of 1150 units/hr. HL goal had been lowered due to patient being on integrelin -- which has now been discontinued. Per Dr. Gala Romney, will target normal heparin range (0.3-0.7)  Platelets stable, H/H stable, no bleeding noted in chart by MD or by RN (previously had some gum bleeding, now resolved s/p amicar oral solution).  Goal of Therapy:  INR 2-3 Heparin level 0.3-0.6 Monitor platelets by anticoagulation protocol: Yes   Plan:   1.  Decrease IV heparin to 1050 units/hr. 2. Check a heparin level 6 hrs after rate adjusted. 3.  Coumadin 10 mg x 1 tonight 3.  Follow-up daily heparin level, INR, CBC, signs/symptoms of bleeding.  Tad Moore, BCPS  Clinical Pharmacist Pager [phone]  08/05/2012 11:41 AM

## 2012-08-05 NOTE — Progress Notes (Signed)
ANTICOAGULATION CONSULT NOTE - Follow Up Consult     HL = 0.63 (goal 0.3 - 0.7 units/mL)   Assessment: 52 yo male s/p LVAD with possible pump thrombosis to continue on IV heparin until INR therapeutic x 2.  Heparin level now therapeutic, but it remains toward the high end of normal; no bleeding reported.    Plan: - Decrease heparin gtt slightly to 900 units/hr - F/U AM labs and order to d/c IV heparin     Dmonte Maher D. Laney Potash, PharmD, BCPS Pager:  [phone] 08/05/2012, 10:03 PM

## 2012-08-05 NOTE — Progress Notes (Signed)
ANTICOAGULATION CONSULT NOTE - Follow Up Consult  Pharmacy Consult for Heparin Indication: LVAD, possible pump thrombosis  Allergies  Allergen Reactions  . Lexapro (Escitalopram Oxalate) Other (See Comments)    somnolence   Labs:  Recent Labs  08/03/12 0545  08/04/12 0500 08/04/12 0600 08/05/12 0615 08/05/12 1030 08/05/12 1418  HGB 12.6*  --  11.5*  --   --  12.0*  --   HCT 38.3*  --  34.1*  --   --  36.8*  --   PLT 245  --  256  --   --  265  --   LABPROT 20.8*  --   --  22.4* 23.9*  --   --   INR 1.87*  --   --  2.06* 2.25*  --   --   HEPARINUNFRC 0.86*  < >  --  0.58 0.75*  --  0.91*  CREATININE 1.06  --  1.17  --  1.07  --   --   < > = values in this interval not displayed.  Estimated Creatinine Clearance: 77.7 ml/min (by C-G formula based on Cr of 1.07).  Assessment: 52 yo male s/p LVAD with possible pump thrombosis.  INR therapeutic today at 2.25 (2.06 yesterday). Per Dr. Gala Romney, will continue IV heparin today to ensure he remains therapeutic. Heparin level this morning is 0.75 at rate of 1150 units/hr. Heparin rate was reduced to 1050/hr this AM, but level continues to rise (0.91).   Per Dr. Gala Romney, will target normal heparin range (0.3-0.7)  Platelets stable, H/H stable, no bleeding noted in chart by MD or by RN (previously had some gum bleeding, now resolved s/p amicar oral solution).  Goal of Therapy:  Heparin level 0.3-0.7 Monitor platelets by anticoagulation protocol: Yes   Plan:   1.  Decrease IV heparin to 950 units/hr. 2. Check a heparin level 6 hrs after rate adjusted. 3.  Follow-up daily heparin level, INR, CBC, signs/symptoms of bleeding.  Tad Moore, BCPS  Clinical Pharmacist Pager [phone]  08/05/2012 3:22 PM

## 2012-08-06 LAB — CBC
HCT: 35.7 % — ABNORMAL LOW (ref 39.0–52.0)
Hemoglobin: 11.9 g/dL — ABNORMAL LOW (ref 13.0–17.0)
MCH: 26.4 pg (ref 26.0–34.0)
MCHC: 33.3 g/dL (ref 30.0–36.0)

## 2012-08-06 LAB — BASIC METABOLIC PANEL
BUN: 14 mg/dL (ref 6–23)
GFR calc non Af Amer: 67 mL/min — ABNORMAL LOW (ref >90)
Glucose, Bld: 95 mg/dL (ref 70–99)
Potassium: 4.6 mEq/L (ref 3.5–5.1)

## 2012-08-06 LAB — PROTIME-INR: Prothrombin Time: 24.2 seconds — ABNORMAL HIGH (ref 11.6–15.2)

## 2012-08-06 MED ORDER — CLOPIDOGREL BISULFATE 75 MG PO TABS
75.0000 mg | ORAL_TABLET | Freq: Every day | ORAL | Status: DC
  Administered 2012-08-07 – 2012-08-08 (×2): 75 mg via ORAL
  Filled 2012-08-06 (×3): qty 1

## 2012-08-06 MED ORDER — WARFARIN SODIUM 7.5 MG PO TABS
7.5000 mg | ORAL_TABLET | Freq: Once | ORAL | Status: AC
  Administered 2012-08-06: 7.5 mg via ORAL
  Filled 2012-08-06: qty 1

## 2012-08-06 NOTE — Progress Notes (Addendum)
ANTICOAGULATION CONSULT NOTE - Follow Up Consult  Pharmacy Consult for Heparin Indication: LVAD, possible pump thrombosis  Allergies  Allergen Reactions  . Lexapro (Escitalopram Oxalate) Other (See Comments)    somnolence   Labs:  Recent Labs  08/04/12 0500 08/04/12 0600 08/05/12 0615 08/05/12 1030 08/05/12 1418 08/05/12 2123 08/06/12 0525  HGB 11.5*  --   --  12.0*  --   --  11.9*  HCT 34.1*  --   --  36.8*  --   --  35.7*  PLT 256  --   --  265  --   --  264  LABPROT  --  22.4* 23.9*  --   --   --  24.2*  INR  --  2.06* 2.25*  --   --   --  2.29*  HEPARINUNFRC  --  0.58 0.75*  --  0.91* 0.63 0.62  CREATININE 1.17  --  1.07  --   --   --  1.23    Estimated Creatinine Clearance: 67.2 ml/min (by C-G formula based on Cr of 1.23).  Assessment: 52 yo male s/p LVAD with possible pump thrombosis.  INR therapeutic today at 2.29 (2.2 yesterday). Per Dr. Gala Romney, will continue IV heparin today to ensure he remains therapeutic. Heparin level this morning is 0.6 at rate of 900 units/hr. Likely will be able to transition off heparin today.  Per Dr. Gala Romney, will target normal heparin range (0.3-0.7)  Platelets stable, H/H stable, no bleeding noted in chart by MD or by RN (previously had some gum bleeding, now resolved s/p amicar oral solution).  Goal of Therapy:  Heparin level 0.3-0.7 Monitor platelets by anticoagulation protocol: Yes INR goal 2-3   Plan:   1.  Continue IV heparin at 900 units/hr. 2.  Follow need to continue heparin today 3.  Follow-up daily heparin level, INR, CBC, signs/symptoms of bleeding. 4.  Repeat warfarin 7.5 mg today.  Sheppard Coil PharmD, Loura Back, BCPS  Clinical Pharmacist Pager [phone]  08/06/2012 7:45 AM

## 2012-08-06 NOTE — Progress Notes (Signed)
HeartMate 2 Rounding Note  Subjective:    [name] is 52 y/o [place] male with a history of severe CHF due to NICM EF 10-15%, and severe MR previously on home milrinone. He had a HM II LVAD placed at [facility] March 2013. His medical history also consists of CRI, NSVT s/p ICD and LV thrombus.   [name] is followed closely by [facility] and the Heart Failure Clinic. In 02/2012 he was admitted to the hospital for LVAD pump thrombosis and hemolysis with bilirubinuria.   Urine has cleared but LDH plateaued so Integrilin added to heparin on 2/8. LDH now falling again.   MAPs in the 70-80s on Losartan, hydralazine, and norvasc. Denies any complaints and ambulating in the halls. No CP/orthopnea/SOB or hematuria. INR therapeutic.   LDH 1234 ->967>941> 897>893>843>732>609>555>531 INR 1.34>1.28>1.55>1.87>2.06>2.25>2.29  LVAD INTERROGATION:  HeartMate II LVAD:  Flow 5.9 liters/min, speed 9200, power 6.3 PI 5.7 Numerous PI events on new speed.   Objective:    Vital Signs:   Temp:  [97.7 F (36.5 C)-98.2 F (36.8 C)] 98.2 F (36.8 C) (02/13 0500) Pulse Rate:  [80] 80 (02/12 1345) Resp:  [20] 20 (02/13 0500) SpO2:  [100 %] 100 % (02/13 0500) Weight:  [74.844 kg (165 lb)] 74.844 kg (165 lb) (02/13 0500) Last BM Date: 08/06/12 Mean arterial Pressure 76 Intake/Output:   Intake/Output Summary (Last 24 hours) at 08/06/12 1022 Last data filed at 08/06/12 0730  Gross per 24 hour  Intake 943.77 ml  Output   2101 ml  Net -1157.23 ml     Physical Exam: General:  Well appearing. No resp difficulty. Sitting in chair HEENT: normal Neck: supple. JVP flat. Carotids 2+ bilat; no bruits. No lymphadenopathy or thryomegaly appreciated. Cor: Mechanical heart sounds with LVAD hum present. Lungs: clear Abdomen: soft, nontender, nondistended. Good bowel sounds. Driveline dressing C/D/I with securment device in place Extremities: no cyanosis, clubbing, rash, edema Neuro: alert & orientedx3, cranial nerves  grossly intact. moves all 4 extremities w/o difficulty. Affect pleasant  Telemetry: SR 70s  Labs: Basic Metabolic Panel:  Recent Labs Lab 08/02/12 0521 08/03/12 0545 08/04/12 0500 08/05/12 0615 08/06/12 0525  NA 137 135 138 137 136  K 4.2 3.8 4.0 4.4 4.6  CL 101 99 102 103 101  CO2 23 27 28 28 27   GLUCOSE 93 95 94 99 95  BUN 13 13 14 13 14   CREATININE 1.09 1.06 1.17 1.07 1.23  CALCIUM 9.5 9.3 8.9 9.0 9.2    Liver Function Tests: No results found for this basename: AST, ALT, ALKPHOS, BILITOT, PROT, ALBUMIN,  in the last 168 hours No results found for this basename: LIPASE, AMYLASE,  in the last 168 hours No results found for this basename: AMMONIA,  in the last 168 hours  CBC:  Recent Labs Lab 08/02/12 0521 08/03/12 0545 08/04/12 0500 08/05/12 1030 08/06/12 0525  WBC 3.5* 3.2* 2.9* 3.1* 3.1*  HGB 12.6* 12.6* 11.5* 12.0* 11.9*  HCT 38.4* 38.3* 34.1* 36.8* 35.7*  MCV 79.7 79.3 79.3 79.3 79.3  PLT 232 245 256 265 264    INR:  Recent Labs Lab 08/02/12 0521 08/03/12 0545 08/04/12 0600 08/05/12 0615 08/06/12 0525  INR 1.55* 1.87* 2.06* 2.25* 2.29*    Other results:    Imaging: No results found.   Medications:     Scheduled Medications: . amLODipine  10 mg Oral Daily  . aspirin EC  81 mg Oral Daily  . carvedilol  25 mg Oral BID WC  . [START  ON 08/07/2012] clopidogrel  75 mg Oral Q breakfast  . ferrous gluconate  324 mg Oral Q breakfast  . hydrALAZINE  100 mg Oral TID  . losartan  100 mg Oral Daily  . magnesium oxide  800 mg Oral BID  . pantoprazole  40 mg Oral Daily  . warfarin  7.5 mg Oral ONCE-1800  . Warfarin - Pharmacist Dosing Inpatient   Does not apply q1800    Infusions: . sodium chloride Stopped (07/28/12 2130)    PRN Medications: acetaminophen, aminocaproic acid, docusate sodium, sodium chloride   Assessment:   1) Probable acute hemolysis due to LVAD pump thrombosis  2) Chronic Systolic HF  -EF 08-65%  3) NICM  -s/p LVAD  08/2011  4) NSVT  5) Hypertension, uncontrolled     Plan/Discussion:    Patient has had three days of therapeutic INRs with heparin, will discontinue today. LDH still slightly elevated above baseline 531 (450-480s). Will add plavix to try and help with throbosis, and will continue to monitor LDH. Would consider sending home today, however the roads are not safe and will keep patient until conditions better.   I have updated Dr. Allena Katz at [facility].  I reviewed the LVAD parameters from today, and compared the results to the patient's prior recorded data. The pump speed is now 9200. The LVAD is functioning within specified parameters.  The patient performs LVAD self-test daily.  LVAD interrogation  was negative for any significant power changes or alarms. Numerous PI events noted.  LVAD equipment check completed and is in good working order.  Back-up equipment present.   LVAD education done on emergency procedures and precautions and reviewed exit site care.   Marshall Roehrich,MD 10:23 AM

## 2012-08-06 NOTE — Progress Notes (Signed)
LVAD dressing changed using sterile technique. Site unremarkable with no s/s of infection. Patient tolerated well. Call bell near. [name]

## 2012-08-07 LAB — CBC
HCT: 38.1 % — ABNORMAL LOW (ref 39.0–52.0)
Hemoglobin: 12.3 g/dL — ABNORMAL LOW (ref 13.0–17.0)
MCH: 25.7 pg — ABNORMAL LOW (ref 26.0–34.0)
MCV: 79.7 fL (ref 78.0–100.0)
RBC: 4.78 MIL/uL (ref 4.22–5.81)

## 2012-08-07 LAB — LACTATE DEHYDROGENASE: LDH: 573 U/L — ABNORMAL HIGH (ref 94–250)

## 2012-08-07 MED ORDER — WARFARIN SODIUM 7.5 MG PO TABS
7.5000 mg | ORAL_TABLET | Freq: Every day | ORAL | Status: AC
  Administered 2012-08-07: 7.5 mg via ORAL
  Filled 2012-08-07: qty 1

## 2012-08-07 NOTE — Progress Notes (Signed)
LVAD dressing changed using sterile technique. LVAD site unremarkable with no s/s of infection. Patient returned to bedside chair without incidence. Call bell near. [name]

## 2012-08-07 NOTE — Progress Notes (Signed)
HeartMate 2 Rounding Note  Subjective:    [name] is 52 y/o [place] male with a history of severe CHF due to NICM EF 10-15%, and severe MR previously on home milrinone. He had a HM II LVAD placed at [facility] March 2013. His medical history also consists of CRI, NSVT s/p ICD and LV thrombus. Had LVAD thrombus in 9/13  Admitted with recurrent LVAD thrombosis and hemolysis. Treated with heparin and 3 days of integrelin wth decrease in LDH.   Heparin stopped yesterday and now LDH climbing up slightly. Feels fine. No dyspnea or other complaints.   LDH 1234 ->967>941> 454>098>119>147>829>562>130>865 INR 2.29  LVAD INTERROGATION:  HeartMate II LVAD:  Flow 4.6 liters/min, speed 9200, power 5.9 PI 6.0 Numerous PI events on new speed.   Objective:    Vital Signs:   Temp:  [97.8 F (36.6 C)-97.9 F (36.6 C)] 97.8 F (36.6 C) (02/14 0447) Pulse Rate:  [83-89] 89 (02/14 0447) Resp:  [18-19] 18 (02/14 0447) SpO2:  [96 %-97 %] 96 % (02/14 0447) Weight:  [74.39 kg (164 lb)] 74.39 kg (164 lb) (02/14 0447) Last BM Date: 08/06/12 Mean arterial Pressure 76 Intake/Output:   Intake/Output Summary (Last 24 hours) at 08/07/12 0838 Last data filed at 08/07/12 0035  Gross per 24 hour  Intake      0 ml  Output    750 ml  Net   -750 ml     Physical Exam: General:  Well appearing. No resp difficulty. Sitting in chair HEENT: normal Neck: supple. JVP flat. Carotids 2+ bilat; no bruits. No lymphadenopathy or thryomegaly appreciated. Cor: Mechanical heart sounds with LVAD hum present. Lungs: clear Abdomen: soft, nontender, nondistended. Good bowel sounds. Driveline dressing C/D/I with securment device in place Extremities: no cyanosis, clubbing, rash, edema Neuro: alert & orientedx3, cranial nerves grossly intact. moves all 4 extremities w/o difficulty. Affect pleasant  Telemetry: SR 70s  Labs: Basic Metabolic Panel:  Recent Labs Lab 08/02/12 0521 08/03/12 0545 08/04/12 0500  08/05/12 0615 08/06/12 0525  NA 137 135 138 137 136  K 4.2 3.8 4.0 4.4 4.6  CL 101 99 102 103 101  CO2 23 27 28 28 27   GLUCOSE 93 95 94 99 95  BUN 13 13 14 13 14   CREATININE 1.09 1.06 1.17 1.07 1.23  CALCIUM 9.5 9.3 8.9 9.0 9.2    Liver Function Tests: No results found for this basename: AST, ALT, ALKPHOS, BILITOT, PROT, ALBUMIN,  in the last 168 hours No results found for this basename: LIPASE, AMYLASE,  in the last 168 hours No results found for this basename: AMMONIA,  in the last 168 hours  CBC:  Recent Labs Lab 08/03/12 0545 08/04/12 0500 08/05/12 1030 08/06/12 0525 08/07/12 0600  WBC 3.2* 2.9* 3.1* 3.1* 3.2*  HGB 12.6* 11.5* 12.0* 11.9* 12.3*  HCT 38.3* 34.1* 36.8* 35.7* 38.1*  MCV 79.3 79.3 79.3 79.3 79.7  PLT 245 256 265 264 294    INR:  Recent Labs Lab 08/03/12 0545 08/04/12 0600 08/05/12 0615 08/06/12 0525 08/07/12 0600  INR 1.87* 2.06* 2.25* 2.29* 2.28*    Other results:    Imaging: No results found.   Medications:     Scheduled Medications: . amLODipine  10 mg Oral Daily  . aspirin EC  81 mg Oral Daily  . carvedilol  25 mg Oral BID WC  . clopidogrel  75 mg Oral Q breakfast  . ferrous gluconate  324 mg Oral Q breakfast  . hydrALAZINE  100 mg  Oral TID  . losartan  100 mg Oral Daily  . magnesium oxide  800 mg Oral BID  . pantoprazole  40 mg Oral Daily  . Warfarin - Pharmacist Dosing Inpatient   Does not apply q1800    Infusions: . 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 INR therapeutic. Continue coumadin, asa 81 and plavix 75. Will keep in house another  day. If LDH continues to trend up may need to transfer to [facility] for possible pump exchange.  I reviewed the LVAD parameters from today, and compared the results to the patient's prior recorded data. The pump speed is now 9200. The LVAD is functioning within specified parameters.  The patient performs LVAD self-test daily.  LVAD interrogation  was negative for any significant power changes or alarms. Numerous PI events noted.  LVAD equipment check completed and is in good working order.  Back-up equipment present.   LVAD education done on emergency procedures and precautions and reviewed exit site care.   Truman Hayward 8:38 AM

## 2012-08-08 LAB — LACTATE DEHYDROGENASE: LDH: 513 U/L — ABNORMAL HIGH (ref 94–250)

## 2012-08-08 LAB — CBC
HCT: 38.2 % — ABNORMAL LOW (ref 39.0–52.0)
MCV: 78.9 fL (ref 78.0–100.0)
Platelets: 295 10*3/uL (ref 150–400)
RBC: 4.84 MIL/uL (ref 4.22–5.81)
WBC: 3.1 10*3/uL — ABNORMAL LOW (ref 4.0–10.5)

## 2012-08-08 MED ORDER — LOSARTAN POTASSIUM 100 MG PO TABS: 100.0000 mg | ORAL_TABLET | Freq: Every day | ORAL | Status: DC

## 2012-08-08 MED ORDER — HYDRALAZINE HCL 100 MG PO TABS: 100.0000 mg | ORAL_TABLET | Freq: Three times a day (TID) | ORAL | Status: DC

## 2012-08-08 MED ORDER — WARFARIN SODIUM 5 MG PO TABS: 7.5000 mg | ORAL_TABLET | Freq: Every day | ORAL | Status: DC

## 2012-08-08 MED ORDER — AMLODIPINE BESYLATE 10 MG PO TABS: 10.0000 mg | ORAL_TABLET | Freq: Every day | ORAL | Status: DC

## 2012-08-08 MED ORDER — CLOPIDOGREL BISULFATE 75 MG PO TABS: 75.0000 mg | ORAL_TABLET | Freq: Every day | ORAL | Status: DC

## 2012-08-08 NOTE — Discharge Summary (Signed)
Advanced Heart Failure Team  Discharge Summary   Patient ID: [name] MRN: [id number], DOB/AGE: [birth year] 52 y.o. Admit date: 07/28/2012 D/C date:     08/08/2012   Primary Discharge Diagnoses:  1) Acute hemolysis due to probableLVAD pump thrombosis  2) Chronic Systolic HF  -EF 91-47%    Secondary Discharge Diagnoses:  1) NICM  -s/p HeartMate II LVAD 08/2011 ([facility]) 2) NSVT  3) Hypertension, uncontrolled   Hospital Course:  [name] is 52 y/o African American male with a history of severe CHF due to NICM EF 10-15%, and severe MR previously on home milrinone. He had a HM II LVAD placed at [facility] March 2013. His medical history also consists of CRI, NSVT s/p ICD and LV thrombus.  [name] is followed closely by [facility] and the Heart Failure Clinic.In 02/2012 he was admitted to the hospital for persistent N/V, elevated LFTs, CR, low haptoglobin and suspected pump thrombosis. He was treated with IV heparin and improved.   He has been doing well as an outpatient until 2/4 when he called the LVAD pager on 07/28/12 reporting hematuria, which he was told to come to the ED. Upon presenting to the ED the patient had labs and vitals taken showing coca-colored urine, INR 1.2, and MAPs 110-120s. The patient was admitted to the hospital for suspected pump thrombosis/hemolysis.  The patient's original LDH came back in the 1200's (baseline 450-480s), and he was started on heparin and his coumadin dose was adjusted. The patient upon admission did not have any power elevations on his LVAD and he denied any heart failure symptoms. During his hospital stay his INR was not trending up originally and his INR was in the 1.2-1.3 range with increased dosing. His MAPs were also running in the 110s-120s and we titrated his anit-hypertensives including hydralazine, cozaar, coreg, norvasc and added clonidine.   [name] LDH started trending down as soon as heparin was started, but ended up pleateuing and integrellin was  added. It took a couple days with increased coumadin dosing and heparin before the INR started trending up. Integrellin was added for 3 days and then discontinued, while heparin continued until 3 days of therapeutic INRs. The patients LDH trended down to 513 and heparin was stopped on 2/13, and plavix was added to his regimen. Once the LDH began trending down anti-hypertensives were decreased and clonidine was stopped.     Today the patient denies any heart failure symptoms and his MAPs have been in the 60s-90s and INR therapeutic. We will discharge him today and recheck his INR and LDH in 3-5 days. I have discussed the case with Dr. Jose Persia at [facility] several times during his admission.  Discharge Vitals: Blood pressure , pulse 83, temperature 98.3 F (36.8 C), temperature source Oral, resp. rate 17, height 5\' 4"  (1.626 m), weight 72.621 kg (160 lb 1.6 oz), SpO2 96.00%.  General: Well appearing. No resp difficulty. Sitting in chair  HEENT: normal  Neck: supple. JVP flat. Carotids 2+ bilat; no bruits. No lymphadenopathy or thryomegaly appreciated.  Cor: Mechanical heart sounds with LVAD hum present.  Lungs: clear  Abdomen: soft, nontender, nondistended. Good bowel sounds. Driveline dressing C/D/I with securment device in place  Extremities: no cyanosis, clubbing, rash, edema  Neuro: alert & orientedx3, cranial nerves grossly intact. moves all 4 extremities w/o difficulty. Affect pleasant   Labs: Lab Results  Component Value Date   WBC 3.1* 08/08/2012   HGB 12.5* 08/08/2012   HCT 38.2* 08/08/2012  MCV 78.9 08/08/2012   PLT 295 08/08/2012     Recent Labs Lab 08/06/12 0525  NA 136  K 4.6  CL 101  CO2 27  BUN 14  CREATININE 1.23  CALCIUM 9.2  GLUCOSE 95   Lab Results  Component Value Date   CHOL 257* 03/19/2012   HDL 43 03/19/2012   LDLCALC 193* 03/19/2012   TRIG 103 03/19/2012   BNP (last 3 results)  Recent Labs  09/08/11 2235 01/14/12 1056 07/08/12 1122  PROBNP 5981.0*  127.0* 2324.0*    Diagnostic Studies/Procedures   No results found.  Discharge Medications     Medication List    TAKE these medications       allopurinol 100 MG tablet  Commonly known as:  ZYLOPRIM  Take 100 mg by mouth as needed. For gout     amLODipine 10 MG tablet  Commonly known as:  NORVASC  Take 1 tablet (10 mg total) by mouth daily.     aspirin EC 81 MG tablet  Take 81 mg by mouth daily.     carvedilol 25 MG tablet  Commonly known as:  COREG  Take 1 tablet (25 mg total) by mouth 2 (two) times daily with a meal.     clopidogrel 75 MG tablet  Commonly known as:  PLAVIX  Take 1 tablet (75 mg total) by mouth daily with breakfast.     ferrous gluconate 324 MG tablet  Commonly known as:  FERGON  Take 324 mg by mouth daily with breakfast.     gabapentin 300 MG capsule  Commonly known as:  NEURONTIN  Take 1 capsule (300 mg total) by mouth 3 (three) times daily.     hydrALAZINE 100 MG tablet  Commonly known as:  APRESOLINE  Take 1 tablet (100 mg total) by mouth 3 (three) times daily.     losartan 100 MG tablet  Commonly known as:  COZAAR  Take 1 tablet (100 mg total) by mouth daily.     MAG-OX 400 PO  Take 800 mg by mouth 2 (two) times daily.     ranitidine 150 MG tablet  Commonly known as:  ZANTAC  Take 150 mg by mouth 2 (two) times daily as needed.     torsemide 20 MG tablet  Commonly known as:  DEMADEX  Take 1 tablet (20 mg total) by mouth as directed.     warfarin 5 MG tablet  Commonly known as:  COUMADIN  Take 1.5 tablets (7.5 mg total) by mouth daily.        Disposition   The patient will be discharged in stable condition to home. Discharge Orders   Future Orders Complete By Expires     ACE Inhibitor / ARB already ordered  As directed     Diet - low sodium heart healthy  As directed     Heart Failure patients record your daily weight using the same scale at the same time of day  As directed     Increase activity slowly  As directed        Follow-up Information   Follow up with Arvilla Meres, MD. (@ 11:45 amGarage code [phone])    Contact information:   [address] [place] [address] [phone]         Duration of Discharge Encounter: Greater than 35 minutes   Migdalia Dk  08/08/2012, 9:06 AM

## 2012-08-08 NOTE — Progress Notes (Signed)
HeartMate 2 Rounding Note  Subjective:    [name] is 52 y/o [place] American male with a history of severe CHF due to NICM EF 10-15%, and severe MR previously on home milrinone. He had a HM II LVAD placed at [facility] March 2013. His medical history also consists of CRI, NSVT s/p ICD and LV thrombus. Had LVAD thrombus in 9/13  Admitted with recurrent LVAD thrombosis and hemolysis. Treated with heparin and 3 days of integrelin wth decrease in LDH.   Doing well. No dyspnea or other complaints.   LDH 1234 ->967>941> 130>865>784>696>295>284>132>440>102 INR 2.25  LVAD INTERROGATION:  HeartMate II LVAD:  Flow 4.5 liters/min, speed 9200, power 5.8 PI 6.1 Numerous PI events on new speed.   Objective:    Vital Signs:   Temp:  [97.5 F (36.4 C)-98.5 F (36.9 C)] 98.3 F (36.8 C) (02/15 0818) Pulse Rate:  [83-93] 83 (02/15 0607) Resp:  [17] 17 (02/15 0607) SpO2:  [96 %-99 %] 96 % (02/15 0818) Weight:  [72.621 kg (160 lb 1.6 oz)] 72.621 kg (160 lb 1.6 oz) (02/15 0607) Last BM Date: 08/06/12 Mean arterial Pressure 76 Intake/Output:   Intake/Output Summary (Last 24 hours) at 08/08/12 0841 Last data filed at 08/08/12 0649  Gross per 24 hour  Intake    840 ml  Output    901 ml  Net    -61 ml     Physical Exam: General:  Well appearing. No resp difficulty. Sitting in chair HEENT: normal Neck: supple. JVP flat. Carotids 2+ bilat; no bruits. No lymphadenopathy or thryomegaly appreciated. Cor: Mechanical heart sounds with LVAD hum present. Lungs: clear Abdomen: soft, nontender, nondistended. Good bowel sounds. Driveline dressing C/D/I with securment device in place Extremities: no cyanosis, clubbing, rash, edema Neuro: alert & orientedx3, cranial nerves grossly intact. moves all 4 extremities w/o difficulty. Affect pleasant  Telemetry: SR 70s  Labs: Basic Metabolic Panel:  Recent Labs Lab 08/02/12 0521 08/03/12 0545 08/04/12 0500 08/05/12 0615 08/06/12 0525  NA 137 135 138 137 136   K 4.2 3.8 4.0 4.4 4.6  CL 101 99 102 103 101  CO2 23 27 28 28 27   GLUCOSE 93 95 94 99 95  BUN 13 13 14 13 14   CREATININE 1.09 1.06 1.17 1.07 1.23  CALCIUM 9.5 9.3 8.9 9.0 9.2    Liver Function Tests: No results found for this basename: AST, ALT, ALKPHOS, BILITOT, PROT, ALBUMIN,  in the last 168 hours No results found for this basename: LIPASE, AMYLASE,  in the last 168 hours No results found for this basename: AMMONIA,  in the last 168 hours  CBC:  Recent Labs Lab 08/04/12 0500 08/05/12 1030 08/06/12 0525 08/07/12 0600 08/08/12 0445  WBC 2.9* 3.1* 3.1* 3.2* 3.1*  HGB 11.5* 12.0* 11.9* 12.3* 12.5*  HCT 34.1* 36.8* 35.7* 38.1* 38.2*  MCV 79.3 79.3 79.3 79.7 78.9  PLT 256 265 264 294 295    INR:  Recent Labs Lab 08/04/12 0600 08/05/12 0615 08/06/12 0525 08/07/12 0600 08/08/12 0445  INR 2.06* 2.25* 2.29* 2.28* 2.25*    Other results:    Imaging: No results found.   Medications:     Scheduled Medications: . amLODipine  10 mg Oral Daily  . aspirin EC  81 mg Oral Daily  . carvedilol  25 mg Oral BID WC  . clopidogrel  75 mg Oral Q breakfast  . ferrous gluconate  324 mg Oral Q breakfast  . hydrALAZINE  100 mg Oral TID  . losartan  100 mg Oral Daily  . magnesium oxide  800 mg Oral BID  . pantoprazole  40 mg Oral Daily  . Warfarin - Pharmacist Dosing Inpatient   Does not apply q1800    Infusions: . sodium chloride Stopped (07/28/12 2130)    PRN Medications: acetaminophen, aminocaproic acid, docusate sodium, sodium chloride   Assessment:   1) Probable acute hemolysis due to LVAD pump thrombosis  2) Chronic Systolic HF  -EF 16-10%  3) NICM  -s/p LVAD 08/2011  4) NSVT  5) Hypertension, uncontrolled     Plan/Discussion:    LDH now trending down again. Will send home on coumadin, asa 81 and plavix 75. Will need close f/u.  I reviewed the LVAD parameters from today, and compared the results to the patient's prior recorded data. The pump speed  is now 9200. The LVAD is functioning within specified parameters.  The patient performs LVAD self-test daily.  LVAD interrogation  was negative for any significant power changes or alarms. Numerous PI events noted.  LVAD equipment check completed and is in good working order.  Back-up equipment present.   LVAD education done on emergency procedures and precautions and reviewed exit site care.   Daniel Bensimhon,MD 8:41 AM

## 2012-08-08 NOTE — Progress Notes (Signed)
Pt discharged per MD order and protocol. All discharge instructions reviewed with patient and all questions answered. Pt states he wants to do his driveline dressing once he gets home. Pt aware of all follow up appointments.

## 2012-08-11 ENCOUNTER — Ambulatory Visit (HOSPITAL_COMMUNITY)
Admission: RE | Admit: 2012-08-11 | Discharge: 2012-08-11 | Disposition: A | Discharge status: Home / Self Care | Payer: Medicare Other | Source: Ambulatory Visit | Attending: Internal Medicine | Admitting: Internal Medicine

## 2012-08-11 ENCOUNTER — Telehealth (HOSPITAL_COMMUNITY): Payer: Self-pay | Admitting: Adult Health

## 2012-08-11 VITALS — BP 100/1 | Wt 177.0 lb

## 2012-08-11 DIAGNOSIS — Z95818 Presence of other cardiac implants and grafts: Secondary | ICD-10-CM

## 2012-08-11 DIAGNOSIS — I10 Essential (primary) hypertension: Secondary | ICD-10-CM | POA: Insufficient documentation

## 2012-08-11 DIAGNOSIS — Z95811 Presence of heart assist device: Secondary | ICD-10-CM

## 2012-08-11 DIAGNOSIS — I5022 Chronic systolic (congestive) heart failure: Secondary | ICD-10-CM | POA: Insufficient documentation

## 2012-08-11 LAB — BASIC METABOLIC PANEL
BUN: 37 mg/dL — ABNORMAL HIGH (ref 6–23)
Chloride: 102 mEq/L (ref 96–112)
GFR calc Af Amer: 52 mL/min — ABNORMAL LOW (ref >90)
Potassium: 4.2 mEq/L (ref 3.5–5.1)
Sodium: 137 mEq/L (ref 135–145)

## 2012-08-11 LAB — CBC
HCT: 38.2 % — ABNORMAL LOW (ref 39.0–52.0)
Hemoglobin: 12.5 g/dL — ABNORMAL LOW (ref 13.0–17.0)
RDW: 18.5 % — ABNORMAL HIGH (ref 11.5–15.5)
WBC: 3.7 10*3/uL — ABNORMAL LOW (ref 4.0–10.5)

## 2012-08-11 LAB — PROTIME-INR
INR: 3.12 — ABNORMAL HIGH (ref 0.00–1.49)
Prothrombin Time: 30.4 seconds — ABNORMAL HIGH (ref 11.6–15.2)

## 2012-08-11 NOTE — Patient Instructions (Addendum)
Will call you with your lab results.  Continue taking medications as prescribed.  Call [facility] if you don't here from them about coumadin dosing.  Follow up 2 months.

## 2012-08-11 NOTE — Assessment & Plan Note (Signed)
Slightly elevated. In the hospital increased hydralazine and cozaar. Will continue alone with coreg and norvasc and will check next visit.

## 2012-08-11 NOTE — Assessment & Plan Note (Addendum)
NYHA I. Volume status good. Patient recently discharged from hospital for thrombosis/hemolysis. Follow up today and reports feeling good and denies any CP, SOB, orthopnea, hematuria or melena. Labs obtained and INR 3.12 and LDH 539 (513 upon d/c 2/15). Continue antihypertensives. Will fax results to [facility] and discuss with Dr. Gala Romney plan since his LDH is trending back up. Follow up in 2 months. LVAD interrogation showed multiple PI events, however non-sustained and patient denies dizziness.

## 2012-08-11 NOTE — Progress Notes (Signed)
HPI: [name] is 52 y/o [place] male with a history of severe CHF due to NICM EF 10-15%, and severe MR previously on home milrinone. He had a HM II LVAD placed at [facility] March 2013. His medical history also consists of CRI, NSVT s/p ICD and LV thrombus.   Recently seen at [facility] and LFTs were mildly elevated and CR up slightly. Also INR low. Diuretics held and place on lovenox. Admitted to the hospital 02/26/12 from clinic for N/V and persistent cough and LFTs elevated and haptoglobin low.  Follow up: Since last visit patient was admitted to the hospital on 07/28/12 for hemolysis/thrombosis. He presented to the ER with coca-colored urine and LDH 1200's. He was started on heparin and then intergrellin. He was discharged on 08/08/12 when his INR was therapeutic and LDH was 513 (baseline 470-520s). He was sent home on coumadin, ASA and Plavix. During his hospital he was hypertensive and we titrated his hydralazine, cozzar and norvasc. He comes in today reporting he feels well. He denies any hematuria, melana, SOB, DOE, or edema. His pump powers are stable.   Denies LVAD alarms.  Denies driveline trauma, erythema or drainage.  Denies ICD shocks.   Reports taking Coumadin as prescribed and adherence to anticoagulation based dietary restrictions.  Denies bright red blood per rectum or melena, no dark urine or hematuria.    Past Medical History  Diagnosis Date  . CHF (congestive heart failure)     EF- 10-15  . Medically noncompliant   . Mitral regurgitation   . Tobacco user   . HTN (hypertension)   . AICD (automatic cardioverter/defibrillator) present   . GERD (gastroesophageal reflux disease)   . Substance abuse   . Chronic renal insufficiency   . Syncope   . Thrombus 08/06/2010  . SYSTOLIC HEART FAILURE, CHRONIC 09/22/2008    Qualifier: Diagnosis of  By: Gala Romney, MD, Trixie Dredge   . LV (left ventricular) mural thrombus 01/28/2011  . ICD - IN SITU 09/16/2008    Qualifier: Diagnosis of  By: Wonda Amis    . MITRAL STENOSIS/ INSUFFICIENCY, NON-RHEUMATIC 09/22/2008    Qualifier: Diagnosis of  By: Gala Romney, MD, Trixie Dredge Hepatomegaly 09/16/2008    Qualifier: Diagnosis of  By: Wonda Amis    . High cholesterol 02/26/2012    "at one time"  . Sleep apnea   . Exertional dyspnea 02/26/2012  . History of blood transfusion 08/2011    "when I had heart pump"  . Migraines   . COMMON MIGRAINE 06/14/2009    Qualifier: Diagnosis of  By: Jonny Ruiz MD, Len Blalock   . History of gout 02/26/2012  . Depression   . Bipolar affective disorder 10/22/2011    pt denies this hx 02/26/2012    Current Outpatient Prescriptions  Medication Sig Dispense Refill  . allopurinol (ZYLOPRIM) 100 MG tablet Take 100 mg by mouth as needed. For gout      . amLODipine (NORVASC) 10 MG tablet Take 1 tablet (10 mg total) by mouth daily.  30 tablet  4  . aspirin EC 81 MG tablet Take 81 mg by mouth daily.      . carvedilol (COREG) 25 MG tablet Take 1 tablet (25 mg total) by mouth 2 (two) times daily with a meal.  60 tablet  6  . clopidogrel (PLAVIX) 75 MG tablet Take 1 tablet (75 mg total) by mouth daily with breakfast.  30 tablet  4  . ferrous gluconate (FERGON)  324 MG tablet Take 324 mg by mouth daily with breakfast.       . gabapentin (NEURONTIN) 300 MG capsule Take 1 capsule (300 mg total) by mouth 3 (three) times daily.  90 capsule  6  . hydrALAZINE (APRESOLINE) 100 MG tablet Take 1 tablet (100 mg total) by mouth 3 (three) times daily.  90 tablet  4  . losartan (COZAAR) 100 MG tablet Take 1 tablet (100 mg total) by mouth daily.  30 tablet  3  . Magnesium Oxide (MAG-OX 400 PO) Take 800 mg by mouth 2 (two) times daily.       . ranitidine (ZANTAC) 150 MG tablet Take 150 mg by mouth 2 (two) times daily as needed.       . torsemide (DEMADEX) 20 MG tablet Take 1 tablet (20 mg total) by mouth as directed.  30 tablet  3  . warfarin (COUMADIN) 5 MG tablet Take 1.5 tablets (7.5 mg total) by mouth daily.  50 tablet  3   . [DISCONTINUED] isosorbide mononitrate (IMDUR) 30 MG 24 hr tablet Take 1 tablet (30 mg total) by mouth daily.  60 tablet  6   No current facility-administered medications for this encounter.    Lexapro  REVIEW OF SYSTEMS: All systems negative except as listed in HPI, PMH and Problem list.   LVAD INTERROGATION:   HeartMate II LVAD:  Flow 5.2 liters/min, speed 9200, power 6.2, PI 5.9.    I reviewed the LVAD parameters from today, and compared the results to the patient's prior recorded data.  No programming changes were made.  The LVAD is functioning within specified parameters.  The patient performs LVAD self-test daily.  LVAD interrogation few power spikes, however non-sustained. Multiple PI events most likely related to cough.  LVAD equipment check completed and is in good working order.  Back-up equipment present.   LVAD education done on emergency procedures and precautions and reviewed exit site care.   Physical Exam: Filed Vitals:   08/11/12 1220  BP: 100/1  Weight: 177 lb (80.287 kg)    GENERAL: Well appearing, male who presents to clinic today in no acute distress. HEENT: normal  NECK: Supple, JVP 7-8.  2+ bilaterally, no bruits.  No lymphadenopathy or thyromegaly appreciated.   CARDIAC:  Mechanical heart sounds with LVAD hum present.  LUNGS:  Clear to auscultation bilaterally.  ABDOMEN:  Soft, round, nontender, positive bowel sounds x4.     LVAD exit site: well-healed and incorporated.  Dressing dry and intact. Stabilization device present and accurately applied.  Driveline dressing is being changed daily per sterile technique. EXTREMITIES:  Warm and dry, no cyanosis, clubbing, rash or 2+ edema  NEUROLOGIC:  Alert and oriented x 4.  Gait steady.  No aphasia.  No dysarthria.  Affect pleasant.     ASSESSMENT AND PLAN:

## 2012-08-11 NOTE — Addendum Note (Signed)
Encounter addended by: Noralee Space, RN on: 08/11/2012  3:12 PM<BR>     Documentation filed: Orders

## 2012-08-11 NOTE — Telephone Encounter (Signed)
Creatinine jumped from 1.23 to 1.73. Patient has not been taking his demadex. Will have patient cut his cozaar back to 50 mg from 100 mg and recheck BMET next Tuesday.

## 2012-08-12 ENCOUNTER — Telehealth (HOSPITAL_COMMUNITY): Payer: Self-pay | Admitting: *Deleted

## 2012-08-12 DIAGNOSIS — D596 Hemoglobinuria due to hemolysis from other external causes: Secondary | ICD-10-CM

## 2012-08-12 NOTE — Telephone Encounter (Signed)
Per [facility] pt needs INR and LDH weekly starting next week, will add to labs going to be drawn 2/25 at [facility]

## 2012-08-18 ENCOUNTER — Other Ambulatory Visit (INDEPENDENT_AMBULATORY_CARE_PROVIDER_SITE_OTHER): Payer: Medicare Other

## 2012-08-18 ENCOUNTER — Encounter (HOSPITAL_COMMUNITY): Payer: Self-pay | Admitting: Emergency Medicine

## 2012-08-18 ENCOUNTER — Emergency Department (HOSPITAL_COMMUNITY)
Admission: EM | Admit: 2012-08-18 | Discharge: 2012-08-19 | Disposition: A | Discharge status: Home / Self Care | Payer: Medicare Other | Attending: Emergency Medicine | Admitting: Emergency Medicine

## 2012-08-18 ENCOUNTER — Telehealth (HOSPITAL_COMMUNITY): Payer: Self-pay | Admitting: *Deleted

## 2012-08-18 ENCOUNTER — Other Ambulatory Visit: Payer: Medicare Other

## 2012-08-18 DIAGNOSIS — N189 Chronic kidney disease, unspecified: Secondary | ICD-10-CM | POA: Insufficient documentation

## 2012-08-18 DIAGNOSIS — K219 Gastro-esophageal reflux disease without esophagitis: Secondary | ICD-10-CM | POA: Insufficient documentation

## 2012-08-18 DIAGNOSIS — F319 Bipolar disorder, unspecified: Secondary | ICD-10-CM | POA: Insufficient documentation

## 2012-08-18 DIAGNOSIS — Z91199 Patient's noncompliance with other medical treatment and regimen due to unspecified reason: Secondary | ICD-10-CM | POA: Insufficient documentation

## 2012-08-18 DIAGNOSIS — I5022 Chronic systolic (congestive) heart failure: Secondary | ICD-10-CM

## 2012-08-18 DIAGNOSIS — Z7902 Long term (current) use of antithrombotics/antiplatelets: Secondary | ICD-10-CM | POA: Insufficient documentation

## 2012-08-18 DIAGNOSIS — Z79899 Other long term (current) drug therapy: Secondary | ICD-10-CM | POA: Insufficient documentation

## 2012-08-18 DIAGNOSIS — I129 Hypertensive chronic kidney disease with stage 1 through stage 4 chronic kidney disease, or unspecified chronic kidney disease: Secondary | ICD-10-CM | POA: Insufficient documentation

## 2012-08-18 DIAGNOSIS — R791 Abnormal coagulation profile: Secondary | ICD-10-CM

## 2012-08-18 DIAGNOSIS — Z8679 Personal history of other diseases of the circulatory system: Secondary | ICD-10-CM | POA: Insufficient documentation

## 2012-08-18 DIAGNOSIS — Z95811 Presence of heart assist device: Secondary | ICD-10-CM

## 2012-08-18 DIAGNOSIS — Z7901 Long term (current) use of anticoagulants: Secondary | ICD-10-CM | POA: Insufficient documentation

## 2012-08-18 DIAGNOSIS — Z7982 Long term (current) use of aspirin: Secondary | ICD-10-CM | POA: Insufficient documentation

## 2012-08-18 DIAGNOSIS — Z95810 Presence of automatic (implantable) cardiac defibrillator: Secondary | ICD-10-CM | POA: Insufficient documentation

## 2012-08-18 DIAGNOSIS — D689 Coagulation defect, unspecified: Secondary | ICD-10-CM | POA: Insufficient documentation

## 2012-08-18 DIAGNOSIS — Z87891 Personal history of nicotine dependence: Secondary | ICD-10-CM | POA: Insufficient documentation

## 2012-08-18 DIAGNOSIS — Z9119 Patient's noncompliance with other medical treatment and regimen: Secondary | ICD-10-CM | POA: Insufficient documentation

## 2012-08-18 LAB — PROTIME-INR
INR: 9 ratio (ref 0.8–1.0)
INR: 10 ratio (ref 0.8–1.0)
INR: 5.48 (ref 0.00–1.49)
INR: 2.56 — ABNORMAL HIGH (ref 0.00–1.49)
Prothrombin Time: 91.2 s (ref 10.2–12.4)
Prothrombin Time: 101.4 s (ref 10.2–12.4)
Prothrombin Time: 45.9 seconds — ABNORMAL HIGH (ref 11.6–15.2)
Prothrombin Time: 26.3 seconds — ABNORMAL HIGH (ref 11.6–15.2)

## 2012-08-18 LAB — CBC
HCT: 37.1 % — ABNORMAL LOW (ref 39.0–52.0)
Hemoglobin: 12.6 g/dL — ABNORMAL LOW (ref 13.0–17.0)
RBC: 4.67 MIL/uL (ref 4.22–5.81)
WBC: 3.9 10*3/uL — ABNORMAL LOW (ref 4.0–10.5)

## 2012-08-18 LAB — TYPE AND SCREEN
ABO/RH(D): O POS
Antibody Screen: NEGATIVE

## 2012-08-18 LAB — BASIC METABOLIC PANEL
CO2: 23 mEq/L (ref 19–32)
Calcium: 8.7 mg/dL (ref 8.4–10.5)
Creatinine, Ser: 1 mg/dL (ref 0.4–1.5)
GFR: 100.77 mL/min (ref >60.00)
Sodium: 140 mEq/L (ref 135–145)

## 2012-08-18 LAB — LACTATE DEHYDROGENASE: LDH: 499 U/L — ABNORMAL HIGH (ref 94–250)

## 2012-08-18 MED ORDER — POTASSIUM CHLORIDE CRYS ER 20 MEQ PO TBCR
40.0000 meq | EXTENDED_RELEASE_TABLET | Freq: Once | ORAL | Status: AC
  Administered 2012-08-18: 40 meq via ORAL
  Filled 2012-08-18: qty 2

## 2012-08-18 MED ORDER — FUROSEMIDE 10 MG/ML IJ SOLN
40.0000 mg | Freq: Once | INTRAMUSCULAR | Status: AC
  Administered 2012-08-18: 40 mg via INTRAVENOUS
  Filled 2012-08-18: qty 4

## 2012-08-18 NOTE — ED Notes (Signed)
Unit# [id number] completed

## 2012-08-18 NOTE — ED Notes (Signed)
Pt LVAD pt with INR greater than 10; pt sent here for FFP; pt denies complaint

## 2012-08-18 NOTE — H&P (Signed)
Advanced Heart Failure Team ER Consult Note  Primary Physician: Dr. Oliver Barre Primary Cardiologist: Dr. Gala Romney  Reason for Consultation: INR 10.0  HPI:    [name] is 52 y/o [place] male with a history of severe CHF due to NICM EF 10-15%, and severe MR previously on home milrinone. He had a HM II LVAD placed at [facility] March 2013. His medical history also consists of CRI, NSVT s/p ICD and LV thrombus.  [name] is followed closely by [facility] and the [facility]. In 02/2012 he was admitted to the hospital for persistent N/V, elevated LFTs, CR and low haptoglobin.   07/28/12 presented to the ER with coca-color urine and LDH 1234. He was admitted to the hospital for suspected thrombosis/hemolysis and started on heparin and integrellin. He was discharged on 08/08/12 on plavix, ASA and coumadin with LDH of 513. His coumadin dose was 7 mg daily and is being managed by [facility].  His INR 1 week post op was 3.12 and LDH 539. Today he went for INR check and his INR was 9.0 and repeat was 10.0. [name] reports taking all of his medications as prescribed, except he forgot his coumadin on Saturday night so he took a dose in the morning on Sunday and a dose on Sunday evening. He denies any gingival bleeding, hematuria, BRBPR or black tarry stools. No orthopena, CP, or edema. Weight at home 183lbs (last weight in clinic 177)  LVAD interrogation: Flow: 4.9 Speed: 9200 PI:6.2  Power: 6.3 Multiple PI events however, non sustained and patient denies dizziness.  I reviewed the LVAD parameters from today, and compared the results to the patient's prior recorded data. No programming changes were made. The LVAD is functioning within specified parameters. The patient performs LVAD self-test daily. LVAD interrogation few power spikes, however non-sustained. Multiple PI events most likely related to cough. LVAD equipment check completed and is in good working order. Back-up equipment present. LVAD education done  on emergency procedures and precautions and reviewed exit site care.     Review of Systems: [y] = yes, [ ]  = no   General: Weight gain [ N]; Weight loss Klaus.Mock ]; Anorexia [ ] ; Fatigue [ ] ; Fever [ ] ; Chills [ ] ; Weakness [ ]   Cardiac: Chest pain/pressure [ ] ; Resting SOB Klaus.Mock ]; Exertional SOB Klaus.Mock ]; Myer Peer ]; Pedal Edema [ Y]; Palpitations [ N]; Syncope [ ] ; Presyncope [ ] ; Paroxysmal nocturnal dyspnea[ ]   Pulmonary: Cough [ N]; Wheezing[ ] ; Hemoptysis[ ] ; Sputum [ ] ; Snoring [ ]   GI: Vomiting[ ] ; Dysphagia[ ] ; Melena[N ]; Hematochezia [ ] ; Heartburn[ ] ; Abdominal pain [ ] ; Constipation [ ] ; Diarrhea [ ] ; BRBPR [Y ]  GU: Hematuria[ ] ; Dysuria [ ] ; Nocturia[ ]   Vascular: Pain in legs with walking [ ] ; Pain in feet with lying flat [ ] ; Non-healing sores [ ] ; Stroke [ ] ; TIA [ ] ; Slurred speech [ ] ;  Neuro: Headaches[ ] ; Vertigo[ ] ; Seizures[ ] ; Paresthesias[ ] ;Blurred vision [ ] ; Diplopia [ ] ; Vision changes [ ]   Ortho/Skin: Arthritis [ ] ; Joint pain [ ] ; Muscle pain [ ] ; Joint swelling [ ] ; Back Pain [ ] ; Rash [ ]   Psych: Depression[ ] ; Anxiety[ ]   Heme: Bleeding problems [ ] ; Clotting disorders [ ] ; Anemia [ ]   Endocrine: Diabetes [ ] ; Thyroid dysfunction[ ]   Home Medications Prior to Admission medications   Medication Sig Start Date End Date Taking? Authorizing Provider  allopurinol (ZYLOPRIM) 100 MG tablet Take 100 mg  by mouth as needed. For gout   Yes Historical Provider, MD  amLODipine (NORVASC) 10 MG tablet Take 1 tablet (10 mg total) by mouth daily. 08/08/12  Yes [name], NP  aspirin EC 81 MG tablet Take 81 mg by mouth daily.   Yes Historical Provider, MD  carvedilol (COREG) 25 MG tablet Take 1 tablet (25 mg total) by mouth 2 (two) times daily with a meal. 01/14/12  Yes Dolores Patty, MD  clopidogrel (PLAVIX) 75 MG tablet Take 1 tablet (75 mg total) by mouth daily with breakfast. 08/08/12  Yes [name], NP  ferrous gluconate (FERGON) 324 MG tablet Take 324 mg by mouth  daily with breakfast.    Yes Historical Provider, MD  gabapentin (NEURONTIN) 300 MG capsule Take 1 capsule (300 mg total) by mouth 3 (three) times daily. 01/14/12  Yes Dolores Patty, MD  hydrALAZINE (APRESOLINE) 100 MG tablet Take 1 tablet (100 mg total) by mouth 3 (three) times daily. 08/08/12  Yes [name], NP  losartan (COZAAR) 100 MG tablet Take 1 tablet (100 mg total) by mouth daily. 08/08/12  Yes [name], NP  Magnesium Oxide (MAG-OX 400 PO) Take 800 mg by mouth 2 (two) times daily.    Yes Historical Provider, MD  ranitidine (ZANTAC) 150 MG tablet Take 150 mg by mouth 2 (two) times daily as needed.    Yes Historical Provider, MD  torsemide (DEMADEX) 20 MG tablet Take 1 tablet (20 mg total) by mouth as directed. 07/08/12 07/08/13 Yes [name], NP  warfarin (COUMADIN) 5 MG tablet Take 1.5 tablets (7.5 mg total) by mouth daily. 08/08/12  Yes [name], NP    Past Medical History: Past Medical History  Diagnosis Date  . CHF (congestive heart failure)     EF- 10-15  . Medically noncompliant   . Mitral regurgitation   . Tobacco user   . HTN (hypertension)   . AICD (automatic cardioverter/defibrillator) present   . GERD (gastroesophageal reflux disease)   . Substance abuse   . Chronic renal insufficiency   . Syncope   . Thrombus 08/06/2010  . SYSTOLIC HEART FAILURE, CHRONIC 09/22/2008    Qualifier: Diagnosis of  By: Gala Romney, MD, Trixie Dredge   . LV (left ventricular) mural thrombus 01/28/2011  . ICD - IN SITU 09/16/2008    Qualifier: Diagnosis of  By: Wonda Amis    . MITRAL STENOSIS/ INSUFFICIENCY, NON-RHEUMATIC 09/22/2008    Qualifier: Diagnosis of  By: Gala Romney, MD, Trixie Dredge Hepatomegaly 09/16/2008    Qualifier: Diagnosis of  By: Wonda Amis    . High cholesterol 02/26/2012    "at one time"  . Sleep apnea   . Exertional dyspnea 02/26/2012  . History of blood transfusion 08/2011    "when I had heart pump"  . Migraines   . COMMON  MIGRAINE 06/14/2009    Qualifier: Diagnosis of  By: Jonny Ruiz MD, Len Blalock   . History of gout 02/26/2012  . Depression   . Bipolar affective disorder 10/22/2011    pt denies this hx 02/26/2012    Past Surgical History: Past Surgical History  Procedure Laterality Date  . Cardiac defibrillator placement  ~ 2008  . Left ventricular assist device  08/2011    Family History: Family History  Problem Relation Age of Onset  . Coronary artery disease Neg Hx     Social History: History   Social History  . Marital  Status: Divorced    Spouse Name: N/A    Number of Children: N/A  . Years of Education: N/A   Social History Main Topics  . Smoking status: Former Smoker -- 0.25 packs/day for 33 years    Types: Cigarettes    Quit date: 06/29/2011  . Smokeless tobacco: Former Neurosurgeon    Quit date: 06/29/2011  . Alcohol Use: No  . Drug Use: No     Comment: denies  . Sexually Active: Not Currently   Other Topics Concern  . None   Social History Narrative  . None    Allergies:  Allergies  Allergen Reactions  . Lexapro (Escitalopram Oxalate) Other (See Comments)    somnolence    Objective:    Vital Signs:   Temp:  [97.8 F (36.6 C)-98.4 F (36.9 C)] 97.9 F (36.6 C) (02/25 2122) Pulse Rate:  [82-90] 82 (02/25 2122) Resp:  [13-20] 16 (02/25 2122) BP: (82-110)/(34-92) 110/92 mmHg (02/25 1914) SpO2:  [97 %] 97 % (02/25 1734)    Weight change: There were no vitals filed for this visit.  Intake/Output:   Intake/Output Summary (Last 24 hours) at 08/18/12 2213 Last data filed at 08/18/12 1849  Gross per 24 hour  Intake    317 ml  Output      0 ml  Net    317 ml     Physical Exam: General:  Well appearing. No resp difficulty HEENT: normal Neck: supple. JVP 7-8 . Carotids 2+ bilat; no bruits. No lymphadenopathy or thryomegaly appreciated. JYN:WGNF hum present Lungs: clear Abdomen: soft, nontender, nondistended. No hepatosplenomegaly. No bruits or masses. Good bowel sounds.  Dressing C/D/I and securement device intact Extremities: no cyanosis, clubbing, rash, 1+ edema Neuro: alert & orientedx3, cranial nerves grossly intact. moves all 4 extremities w/o difficulty. Affect pleasant  Telemetry: SR 86  Labs: Basic Metabolic Panel:  Recent Labs Lab 08/18/12 1318  NA 140  K 3.9  CL 111  CO2 23  GLUCOSE 78  BUN 14  CREATININE 1.0  CALCIUM 8.7    Liver Function Tests: No results found for this basename: AST, ALT, ALKPHOS, BILITOT, PROT, ALBUMIN,  in the last 168 hours No results found for this basename: LIPASE, AMYLASE,  in the last 168 hours No results found for this basename: AMMONIA,  in the last 168 hours  CBC:  Recent Labs Lab 08/18/12 1736  WBC 3.9*  HGB 12.6*  HCT 37.1*  MCV 79.4  PLT 280    Cardiac Enzymes: No results found for this basename: CKTOTAL, CKMB, CKMBINDEX, TROPONINI,  in the last 168 hours  BNP: BNP (last 3 results)  Recent Labs  09/08/11 2235 01/14/12 1056 07/08/12 1122  PROBNP 5981.0* 127.0* 2324.0*    CBG: No results found for this basename: GLUCAP,  in the last 168 hours  Coagulation Studies:  Recent Labs  08/18/12 1318 08/18/12 1622 08/18/12 1736  LABPROT 91.2* 101.4* 45.9*  INR 9.0* 10.0* 5.48*    Other results:  Imaging: No results found.   Medications:     Current Medications:    Infusions:     Assessment:  1) Supratherapeutic INR 2) Chronic Systolic HF          -EF 10-15%  3) NICM  -s/p LVAD 08/2011  4) NSVT  5) Hypertension, uncontrolled  6) H/O acute hemolysis due to LVAD pump thrombosis     Plan/Discussion:    INR 10.0 in setting or recent pump thrombosis. Fortunately no acute signs of bleeding at  this time. Will give 3 FFP and recheck INR in 1 hr. Will try to get INR < 5.0.  NoVitamin K. Awaiting CBC and LDH. Have talked to [facility] about patient situation and will keep them informed. Can give one dose of IV lasix as well.   LVAD interrogation looks good.   Length  of Stay: 0  Arvilla Meres 08/18/2012, 10:13 PM

## 2012-08-18 NOTE — ED Provider Notes (Signed)
History     CSN: [id number]  Arrival date & time 08/18/12  1722   First MD Initiated Contact with Patient 08/18/12 1741      Chief Complaint  Patient presents with  . Abnormal Lab    HPI [name] is a 52 y.o. male who presents to the ED for concern of coumadin coagulopathy.  Patient with history of severe CHF and s/p LVAD implantation.  INR at 9.0.  Recommended that he come here for reversal.  Has history of recently upping dose of coumadin from 3mg  up to 7mg  two weeks ago.  First time checked since change was today.  No blood in stool, vomiting blood, bloody noses, or bruising/hematoma.  No chest pain/abdominal pain.  Patient otherwise asymptomatic and feeling at baseline.  Past Medical History  Diagnosis Date  . CHF (congestive heart failure)     EF- 10-15  . Medically noncompliant   . Mitral regurgitation   . Tobacco user   . HTN (hypertension)   . AICD (automatic cardioverter/defibrillator) present   . GERD (gastroesophageal reflux disease)   . Substance abuse   . Chronic renal insufficiency   . Syncope   . Thrombus 08/06/2010  . SYSTOLIC HEART FAILURE, CHRONIC 09/22/2008    Qualifier: Diagnosis of  By: Gala Romney, MD, Trixie Dredge   . LV (left ventricular) mural thrombus 01/28/2011  . ICD - IN SITU 09/16/2008    Qualifier: Diagnosis of  By: Wonda Amis    . MITRAL STENOSIS/ INSUFFICIENCY, NON-RHEUMATIC 09/22/2008    Qualifier: Diagnosis of  By: Gala Romney, MD, Trixie Dredge Hepatomegaly 09/16/2008    Qualifier: Diagnosis of  By: Wonda Amis    . High cholesterol 02/26/2012    "at one time"  . Sleep apnea   . Exertional dyspnea 02/26/2012  . History of blood transfusion 08/2011    "when I had heart pump"  . Migraines   . COMMON MIGRAINE 06/14/2009    Qualifier: Diagnosis of  By: Jonny Ruiz MD, Len Blalock   . History of gout 02/26/2012  . Depression   . Bipolar affective disorder 10/22/2011    pt denies this hx 02/26/2012    Past Surgical History   Procedure Laterality Date  . Cardiac defibrillator placement  ~ 2008  . Left ventricular assist device  08/2011    Family History  Problem Relation Age of Onset  . Coronary artery disease Neg Hx     History  Substance Use Topics  . Smoking status: Former Smoker -- 0.25 packs/day for 33 years    Types: Cigarettes    Quit date: 06/29/2011  . Smokeless tobacco: Former Neurosurgeon    Quit date: 06/29/2011  . Alcohol Use: No      Review of Systems  Constitutional: Negative for fever and chills.  HENT: Negative for congestion, sore throat and neck pain.   Respiratory: Negative for cough.   Gastrointestinal: Negative for nausea, vomiting, abdominal pain, diarrhea and constipation.  Endocrine: Negative for polyuria.  Genitourinary: Negative for dysuria and hematuria.  Skin: Negative for rash.  Neurological: Negative for headaches.  Psychiatric/Behavioral: Negative.   All other systems reviewed and are negative.    Allergies  Lexapro  Home Medications   Current Outpatient Rx  Name  Route  Sig  Dispense  Refill  . allopurinol (ZYLOPRIM) 100 MG tablet   Oral   Take 100 mg by mouth as needed. For gout         . amLODipine (  NORVASC) 10 MG tablet   Oral   Take 1 tablet (10 mg total) by mouth daily.   30 tablet   4   . aspirin EC 81 MG tablet   Oral   Take 81 mg by mouth daily.         . carvedilol (COREG) 25 MG tablet   Oral   Take 1 tablet (25 mg total) by mouth 2 (two) times daily with a meal.   60 tablet   6   . clopidogrel (PLAVIX) 75 MG tablet   Oral   Take 1 tablet (75 mg total) by mouth daily with breakfast.   30 tablet   4   . ferrous gluconate (FERGON) 324 MG tablet   Oral   Take 324 mg by mouth daily with breakfast.          . gabapentin (NEURONTIN) 300 MG capsule   Oral   Take 1 capsule (300 mg total) by mouth 3 (three) times daily.   90 capsule   6   . hydrALAZINE (APRESOLINE) 100 MG tablet   Oral   Take 1 tablet (100 mg total) by mouth 3  (three) times daily.   90 tablet   4   . losartan (COZAAR) 100 MG tablet   Oral   Take 1 tablet (100 mg total) by mouth daily.   30 tablet   3   . Magnesium Oxide (MAG-OX 400 PO)   Oral   Take 800 mg by mouth 2 (two) times daily.          . ranitidine (ZANTAC) 150 MG tablet   Oral   Take 150 mg by mouth 2 (two) times daily as needed.          . torsemide (DEMADEX) 20 MG tablet   Oral   Take 1 tablet (20 mg total) by mouth as directed.   30 tablet   3   . warfarin (COUMADIN) 5 MG tablet   Oral   Take 1.5 tablets (7.5 mg total) by mouth daily.   50 tablet   3     BP 82/34  Pulse 90  Temp(Src) 97.8 F (36.6 C) (Oral)  Resp 18  SpO2 97%  Physical Exam  Nursing note and vitals reviewed. Constitutional: He is oriented to person, place, and time. He appears well-developed and well-nourished. No distress.  HENT:  Head: Normocephalic and atraumatic.  Right Ear: External ear normal.  Left Ear: External ear normal.  Mouth/Throat: Oropharynx is clear and moist. No oropharyngeal exudate.  Eyes: Conjunctivae are normal. Pupils are equal, round, and reactive to light. Right eye exhibits no discharge.  Neck: Normal range of motion. Neck supple. No tracheal deviation present.  Cardiovascular:  Mechanical sound  Pulmonary/Chest: Effort normal. No respiratory distress. He has no wheezes. He has no rales.  Abdominal: Soft. He exhibits no distension. There is no tenderness. There is no rebound and no guarding.  Musculoskeletal: Normal range of motion.  Neurological: He is alert and oriented to person, place, and time.  Skin: Skin is warm and dry. No rash noted. He is not diaphoretic.  Psychiatric: He has a normal mood and affect.    ED Course  Procedures (including critical care time)  Labs Reviewed  CBC - Abnormal; Notable for the following:    WBC 3.9 (*)    Hemoglobin 12.6 (*)    HCT 37.1 (*)    RDW 18.9 (*)    All other components within normal limits  PROTIME-INR - Abnormal; Notable for the following:    Prothrombin Time 45.9 (*)    INR 5.48 (*)    All other components within normal limits  LACTATE DEHYDROGENASE - Abnormal; Notable for the following:    LDH 499 (*)    All other components within normal limits  PROTIME-INR - Abnormal; Notable for the following:    Prothrombin Time 26.3 (*)    INR 2.56 (*)    All other components within normal limits  TYPE AND SCREEN  PREPARE FRESH FROZEN PLASMA   No results found.   1. Supratherapeutic INR   2. Chronic systolic heart failure   3. LVAD (left ventricular assist device) present   4. Warfarin-induced coagulopathy, initial encounter       MDM  [name] is a 52 y.o. male with history of CHF EF 10% and LVAD who presented to the ED from clinic with INR at 9.0 after recent coumadin dose change.  Asymptomatic.  LVAD team consulted and wishing to reverse with FFP.  Coagulopathy reversed per CHF physician.  Patient still asymptomatic without evidence of bleed.  Patient safe for discharge.  CHF physicians to see patient tomorrow.  Patient discharged.  Full batteries on LVAD pack.         Arloa Koh, MD 08/19/12 [phone]

## 2012-08-18 NOTE — ED Notes (Signed)
Pressure palp was 94.

## 2012-08-18 NOTE — Telephone Encounter (Signed)
Lab called to report pt's INR is 9.0, per [name], NP have repeat ASAP pt aware and agreeable he will go now to [facility]

## 2012-08-18 NOTE — ED Notes (Signed)
Rate increased to 

## 2012-08-18 NOTE — ED Notes (Signed)
LVAD nurse has been paged. Pt was sent for INR > 10, LVAD team states pt should not be given Vitamin K

## 2012-08-18 NOTE — ED Notes (Signed)
Pt sent from Cardiologist after having INR checked and found to be 10. Pt denies pain, denies blood in stools.

## 2012-08-19 ENCOUNTER — Ambulatory Visit (INDEPENDENT_AMBULATORY_CARE_PROVIDER_SITE_OTHER): Payer: Medicare Other | Admitting: *Deleted

## 2012-08-19 ENCOUNTER — Telehealth (HOSPITAL_COMMUNITY): Payer: Self-pay | Admitting: Anesthesiology

## 2012-08-19 DIAGNOSIS — I5022 Chronic systolic (congestive) heart failure: Secondary | ICD-10-CM

## 2012-08-19 DIAGNOSIS — I8290 Acute embolism and thrombosis of unspecified vein: Secondary | ICD-10-CM

## 2012-08-19 DIAGNOSIS — I749 Embolism and thrombosis of unspecified artery: Secondary | ICD-10-CM

## 2012-08-19 LAB — PREPARE FRESH FROZEN PLASMA
Unit division: 0
Unit division: 0

## 2012-08-19 NOTE — ED Provider Notes (Addendum)
I saw and reviewed the resident's note and I agree with the findings and plan.   .Face to face Exam:  General:  Awake HEENT:  Atraumatic Resp:  Normal effort Abd:  Nondistended Neuro:No focal weakness Lymph: No adenopathy    Nelia Shi, MD 08/19/12 1516  Nelia Shi, MD 09/09/12 [phone]

## 2012-08-19 NOTE — Telephone Encounter (Signed)
Patient called to get INR today and will fax to [facility]. He said he would go later today to get it checked.

## 2012-08-19 NOTE — Addendum Note (Signed)
Addended by: Noralee Space on: 08/19/2012 11:07 AM   Modules accepted: Orders

## 2012-08-19 NOTE — Telephone Encounter (Signed)
Faxed patients INR to [facility]. Told patient to hold Coumadin tonight and that [facility] will call for follow up and when to take again.

## 2012-08-19 NOTE — ED Notes (Signed)
Called Dr. B and informed him of the new INR.  Instructed not to give another FFP.

## 2012-08-20 ENCOUNTER — Other Ambulatory Visit: Payer: Medicare Other

## 2012-08-20 DIAGNOSIS — D596 Hemoglobinuria due to hemolysis from other external causes: Secondary | ICD-10-CM

## 2012-08-20 DIAGNOSIS — Z95811 Presence of heart assist device: Secondary | ICD-10-CM

## 2012-08-20 LAB — PROTIME-INR: Prothrombin Time: 57.8 s (ref 10.2–12.4)

## 2012-08-25 ENCOUNTER — Other Ambulatory Visit: Payer: Medicare Other

## 2012-08-26 ENCOUNTER — Other Ambulatory Visit (INDEPENDENT_AMBULATORY_CARE_PROVIDER_SITE_OTHER): Payer: Medicare Other

## 2012-08-26 LAB — PROTIME-INR
INR: 2 ratio — ABNORMAL HIGH (ref 0.8–1.0)
Prothrombin Time: 20.6 s — ABNORMAL HIGH (ref 10.2–12.4)

## 2012-09-01 ENCOUNTER — Other Ambulatory Visit: Payer: Medicare Other

## 2012-09-02 ENCOUNTER — Other Ambulatory Visit: Payer: Medicare Other

## 2012-09-02 DIAGNOSIS — Z95811 Presence of heart assist device: Secondary | ICD-10-CM

## 2012-09-02 DIAGNOSIS — D596 Hemoglobinuria due to hemolysis from other external causes: Secondary | ICD-10-CM

## 2012-09-02 LAB — PROTIME-INR
INR: 2.6 ratio — ABNORMAL HIGH (ref 0.8–1.0)
Prothrombin Time: 27.4 s — ABNORMAL HIGH (ref 10.2–12.4)

## 2012-09-04 ENCOUNTER — Other Ambulatory Visit: Payer: Self-pay | Admitting: Internal Medicine

## 2012-09-07 ENCOUNTER — Telehealth (HOSPITAL_COMMUNITY): Payer: Self-pay | Admitting: *Deleted

## 2012-09-07 NOTE — Telephone Encounter (Signed)
Pt called to report his feet and ankles are swollen, he states they have been swelling for about a month and "look like elephant feet" he states his wt is stable at 184 which is the same it has been for the past week, his breathing is ok, discussed with [name], NP will have pt take torsemide 40 mg today and tomorrow, he currently is not taking any diuretics, along with potassium 20, pt is aware and agreeable, he states he has both medications at home, he will call us back on Wed with an update

## 2012-09-08 ENCOUNTER — Other Ambulatory Visit: Payer: Medicare Other

## 2012-09-11 ENCOUNTER — Telehealth (HOSPITAL_COMMUNITY): Payer: Self-pay | Admitting: *Deleted

## 2012-09-11 MED ORDER — METOLAZONE 2.5 MG PO TABS: 2.5000 mg | ORAL_TABLET | Freq: Every day | ORAL | Status: DC

## 2012-09-11 NOTE — Telephone Encounter (Signed)
Pt called to report he is still swollen, he states his arms a little better but his feet and ankles are still to large for him to fit into shoes, today is the only day he has weighted and it was 187 lbs discussed w/Ali Elsie Ra, NP will give pt metolazone 2.5 mg for 2 days and sch appt for Mon, pt aware and agreeable rx sent in pt will be here Mon am at 9

## 2012-09-14 ENCOUNTER — Ambulatory Visit (HOSPITAL_COMMUNITY)
Admission: RE | Admit: 2012-09-14 | Discharge: 2012-09-14 | Disposition: A | Discharge status: Home / Self Care | Payer: Medicare Other | Source: Ambulatory Visit | Attending: Internal Medicine | Admitting: Internal Medicine

## 2012-09-14 VITALS — BP 104/1 | Wt 187.2 lb

## 2012-09-14 DIAGNOSIS — I5022 Chronic systolic (congestive) heart failure: Secondary | ICD-10-CM

## 2012-09-14 DIAGNOSIS — I129 Hypertensive chronic kidney disease with stage 1 through stage 4 chronic kidney disease, or unspecified chronic kidney disease: Secondary | ICD-10-CM | POA: Insufficient documentation

## 2012-09-14 DIAGNOSIS — I509 Heart failure, unspecified: Secondary | ICD-10-CM | POA: Insufficient documentation

## 2012-09-14 DIAGNOSIS — I10 Essential (primary) hypertension: Secondary | ICD-10-CM

## 2012-09-14 DIAGNOSIS — R0602 Shortness of breath: Secondary | ICD-10-CM

## 2012-09-14 DIAGNOSIS — Z95818 Presence of other cardiac implants and grafts: Secondary | ICD-10-CM

## 2012-09-14 DIAGNOSIS — N189 Chronic kidney disease, unspecified: Secondary | ICD-10-CM | POA: Insufficient documentation

## 2012-09-14 DIAGNOSIS — Z95811 Presence of heart assist device: Secondary | ICD-10-CM

## 2012-09-14 LAB — BASIC METABOLIC PANEL
Calcium: 9.7 mg/dL (ref 8.4–10.5)
GFR calc Af Amer: >90 mL/min (ref >90)
GFR calc non Af Amer: >90 mL/min (ref >90)
Glucose, Bld: 84 mg/dL (ref 70–99)
Sodium: 135 mEq/L (ref 135–145)

## 2012-09-14 LAB — PRO B NATRIURETIC PEPTIDE: Pro B Natriuretic peptide (BNP): 485 pg/mL — ABNORMAL HIGH (ref 0–125)

## 2012-09-14 MED ORDER — TORSEMIDE 20 MG PO TABS: 40.0000 mg | ORAL_TABLET | Freq: Every day | ORAL | Status: DC

## 2012-09-14 MED ORDER — POTASSIUM CHLORIDE CRYS ER 20 MEQ PO TBCR: 20.0000 meq | EXTENDED_RELEASE_TABLET | Freq: Two times a day (BID) | ORAL | Status: DC

## 2012-09-14 MED ORDER — LOSARTAN POTASSIUM 100 MG PO TABS: 100.0000 mg | ORAL_TABLET | Freq: Every day | ORAL | Status: DC

## 2012-09-14 NOTE — Progress Notes (Deleted)
Entered in error

## 2012-09-14 NOTE — Patient Instructions (Addendum)
Take Demadex 40 mg (2 tabs) Twice daily and Potassium 20 meq Twice daily FOR 2 DAYS THEN Demadex 40 mg (2 tabs) daily and Potassium 20 meq daily  Increase losartan to 100 mg daily.  Call tomorrow with weight.  Weigh daily.  Follow up 1 month

## 2012-09-15 ENCOUNTER — Other Ambulatory Visit: Payer: Medicare Other

## 2012-09-15 NOTE — Assessment & Plan Note (Signed)
Volume status up significantly in the setting of confusion over his diuretic. Will resume demadex at 40 bid x 2 days then 40 daily. He will call on Wednesday f he has not had significant response.

## 2012-09-15 NOTE — Progress Notes (Addendum)
HPI: [name] is 52 y/o [place] American male with a history of severe CHF due to NICM EF 10-15%, and severe MR previously on home milrinone. He had a HM II LVAD placed at [facility] March 2013. His medical history also consists of CRI, NSVT s/p ICD and LV thrombus.   Recently seen at [facility] and LFTs were mildly elevated and CR up slightly. Also INR low. Diuretics held and place on lovenox. Admitted to the hospital 02/26/12 from clinic for N/V and persistent cough and LFTs elevated and haptoglobin low. Admitted hospital 07/28/12 for hemolysis/thrombosis, LDH 1200's. His LDH came down to 513 and was started on Plavix along with ASA and coumadin.  Follow up: Reports that for the last month he has noticed increased swelling in his ankles and abdomen. He reports taking his medications as prescribed, however has not been taking demadex because he thought he was told to hold it. He took 40 mg demadex x two days last week and then over the weekend took 2.5 mg metolazone x 2 days (did not take demadex). Weight is up 10 lbs from last visit. Reports increased SOB and DOE. Goes back to [facility] on Wednesday.  Denies LVAD alarms.  Denies driveline trauma, erythema or drainage.  Denies ICD shocks.   Reports taking Coumadin as prescribed and adherence to anticoagulation based dietary restrictions.  Denies bright red blood per rectum or melena, no dark urine or hematuria.    Past Medical History  Diagnosis Date  . CHF (congestive heart failure)     EF- 10-15  . Medically noncompliant   . Mitral regurgitation   . Tobacco user   . HTN (hypertension)   . AICD (automatic cardioverter/defibrillator) present   . GERD (gastroesophageal reflux disease)   . Substance abuse   . Chronic renal insufficiency   . Syncope   . Thrombus 08/06/2010  . SYSTOLIC HEART FAILURE, CHRONIC 09/22/2008    Qualifier: Diagnosis of  By: Gala Romney, MD, Trixie Dredge   . LV (left ventricular) mural thrombus 01/28/2011  . ICD - IN SITU 09/16/2008     Qualifier: Diagnosis of  By: Wonda Amis    . MITRAL STENOSIS/ INSUFFICIENCY, NON-RHEUMATIC 09/22/2008    Qualifier: Diagnosis of  By: Gala Romney, MD, Trixie Dredge Hepatomegaly 09/16/2008    Qualifier: Diagnosis of  By: Wonda Amis    . High cholesterol 02/26/2012    "at one time"  . Sleep apnea   . Exertional dyspnea 02/26/2012  . History of blood transfusion 08/2011    "when I had heart pump"  . Migraines   . COMMON MIGRAINE 06/14/2009    Qualifier: Diagnosis of  By: Jonny Ruiz MD, Len Blalock   . History of gout 02/26/2012  . Depression   . Bipolar affective disorder 10/22/2011    pt denies this hx 02/26/2012    Current Outpatient Prescriptions  Medication Sig Dispense Refill  . allopurinol (ZYLOPRIM) 100 MG tablet Take 100 mg by mouth as needed. For gout      . amLODipine (NORVASC) 10 MG tablet Take 1 tablet (10 mg total) by mouth daily.  30 tablet  4  . aspirin EC 81 MG tablet Take 81 mg by mouth daily.      . carvedilol (COREG) 25 MG tablet 25 mg 2 (two) times daily with a meal.       . clopidogrel (PLAVIX) 75 MG tablet Take 1 tablet (75 mg total) by mouth daily with breakfast.  30 tablet  4  . ferrous gluconate (FERGON) 324 MG tablet Take 324 mg by mouth daily with breakfast.       . gabapentin (NEURONTIN) 300 MG capsule Take 1 capsule (300 mg total) by mouth 3 (three) times daily.  90 capsule  6  . hydrALAZINE (APRESOLINE) 100 MG tablet Take 1 tablet (100 mg total) by mouth 3 (three) times daily.  90 tablet  4  . losartan (COZAAR) 100 MG tablet Take 1 tablet (100 mg total) by mouth daily.  30 tablet  3  . Magnesium Oxide (MAG-OX 400 PO) Take 800 mg by mouth 2 (two) times daily.       . metolazone (ZAROXOLYN) 2.5 MG tablet Take 1 tablet (2.5 mg total) by mouth daily. As directed  6 tablet  0  . ranitidine (ZANTAC) 150 MG tablet Take 150 mg by mouth 2 (two) times daily as needed.       . warfarin (COUMADIN) 5 MG tablet Take 1.5 tablets (7.5 mg total) by mouth daily.  50  tablet  3  . torsemide (DEMADEX) 20 MG tablet Take 2 tablets (40 mg total) by mouth daily.  30 tablet  3  . [DISCONTINUED] isosorbide mononitrate (IMDUR) 30 MG 24 hr tablet Take 1 tablet (30 mg total) by mouth daily.  60 tablet  6   Current Facility-Administered Medications  Medication Dose Route Frequency Provider Last Rate Last Dose  . potassium chloride SA (K-DUR,KLOR-CON) CR tablet 20 mEq  20 mEq Oral BID [name], NP        Lexapro  REVIEW OF SYSTEMS: All systems negative except as listed in HPI, PMH and Problem list.   LVAD INTERROGATION:   HeartMate II LVAD:  Flow 4.7 liters/min, speed 9200, power 6.3, PI 6.2.    I reviewed the LVAD parameters from today, and compared the results to the patient's prior recorded data.  No programming changes were made.  The LVAD is functioning within specified parameters.  The patient performs LVAD self-test daily. Multiple PI events daily. His equipment check completed and is in good working order.  Back-up equipment present.   LVAD education done on emergency procedures and precautions and reviewed exit site care.   Physical Exam: Filed Vitals:   09/14/12 1058  BP: 104/1  Weight: 187 lb 4 oz (84.936 kg)    GENERAL: Well appearing, male who presents to clinic today in no acute distress. HEENT: normal  NECK: Supple, JVP to jaw.  2+ bilaterally, no bruits.  No lymphadenopathy or thyromegaly appreciated.   CARDIAC: LVAD hum present.  LUNGS:  Clear to auscultation bilaterally.  ABDOMEN:  Soft, round, nontender, + distention;  positive bowel sounds x4.     LVAD exit site: well-healed and incorporated.  Dressing dry and intact. Stabilization device present and accurately applied.  Driveline dressing is being changed daily per sterile technique. EXTREMITIES:  Warm and dry, no cyanosis, clubbing, rash or 3+ edema  NEUROLOGIC:  Alert and oriented x 4.  Gait steady.  No aphasia.  No dysarthria.  Affect pleasant.     ASSESSMENT AND PLAN:

## 2012-09-15 NOTE — Assessment & Plan Note (Signed)
Flows reading "--"  Likely due to HTN. Increasing meds. He is having some PI events but these are relatively stable. Hopefully will improve with BP control. Pump thrombosis seems to be resolved.

## 2012-09-15 NOTE — Assessment & Plan Note (Signed)
BP up. Increase Losartan to 100 daily. May need to add Norvasc.

## 2012-09-16 ENCOUNTER — Encounter: Payer: Self-pay | Admitting: Internal Medicine

## 2012-09-22 ENCOUNTER — Other Ambulatory Visit: Payer: Medicare Other

## 2012-09-24 ENCOUNTER — Other Ambulatory Visit (INDEPENDENT_AMBULATORY_CARE_PROVIDER_SITE_OTHER): Payer: Medicare Other

## 2012-09-24 ENCOUNTER — Telehealth (HOSPITAL_COMMUNITY): Payer: Self-pay | Admitting: Cardiology

## 2012-09-24 DIAGNOSIS — I509 Heart failure, unspecified: Secondary | ICD-10-CM

## 2012-09-24 NOTE — Telephone Encounter (Signed)
Orders placed for pt/inr

## 2012-09-28 ENCOUNTER — Encounter (HOSPITAL_COMMUNITY): Payer: Self-pay | Admitting: Anesthesiology

## 2012-09-28 ENCOUNTER — Other Ambulatory Visit (INDEPENDENT_AMBULATORY_CARE_PROVIDER_SITE_OTHER): Payer: Medicare Other

## 2012-09-28 DIAGNOSIS — I509 Heart failure, unspecified: Secondary | ICD-10-CM

## 2012-09-28 LAB — PROTIME-INR: INR: 1.2 ratio — ABNORMAL HIGH (ref 0.8–1.0)

## 2012-09-30 ENCOUNTER — Encounter (HOSPITAL_COMMUNITY): Payer: Medicare Other

## 2012-10-14 ENCOUNTER — Ambulatory Visit (HOSPITAL_COMMUNITY)
Admission: RE | Admit: 2012-10-14 | Discharge: 2012-10-14 | Disposition: A | Discharge status: Home / Self Care | Payer: Medicare Other | Source: Ambulatory Visit | Attending: Internal Medicine | Admitting: Internal Medicine

## 2012-10-14 VITALS — BP 90/1 | Wt 184.5 lb

## 2012-10-14 DIAGNOSIS — Z95818 Presence of other cardiac implants and grafts: Secondary | ICD-10-CM

## 2012-10-14 DIAGNOSIS — F17200 Nicotine dependence, unspecified, uncomplicated: Secondary | ICD-10-CM

## 2012-10-14 DIAGNOSIS — I8290 Acute embolism and thrombosis of unspecified vein: Secondary | ICD-10-CM

## 2012-10-14 DIAGNOSIS — I5022 Chronic systolic (congestive) heart failure: Secondary | ICD-10-CM | POA: Insufficient documentation

## 2012-10-14 DIAGNOSIS — I749 Embolism and thrombosis of unspecified artery: Secondary | ICD-10-CM

## 2012-10-14 DIAGNOSIS — I509 Heart failure, unspecified: Secondary | ICD-10-CM | POA: Insufficient documentation

## 2012-10-14 DIAGNOSIS — D596 Hemoglobinuria due to hemolysis from other external causes: Secondary | ICD-10-CM

## 2012-10-14 DIAGNOSIS — Z95811 Presence of heart assist device: Secondary | ICD-10-CM

## 2012-10-14 DIAGNOSIS — IMO0002 Reserved for concepts with insufficient information to code with codable children: Secondary | ICD-10-CM

## 2012-10-14 LAB — BASIC METABOLIC PANEL
CO2: 31 mEq/L (ref 19–32)
Calcium: 9.7 mg/dL (ref 8.4–10.5)
GFR calc non Af Amer: 71 mL/min — ABNORMAL LOW (ref >90)
Potassium: 4.1 mEq/L (ref 3.5–5.1)
Sodium: 141 mEq/L (ref 135–145)

## 2012-10-14 LAB — LACTATE DEHYDROGENASE: LDH: 395 U/L — ABNORMAL HIGH (ref 94–250)

## 2012-10-14 LAB — CBC
Platelets: 287 10*3/uL (ref 150–400)
RBC: 4.8 MIL/uL (ref 4.22–5.81)
WBC: 3.3 10*3/uL — ABNORMAL LOW (ref 4.0–10.5)

## 2012-10-14 LAB — PROTIME-INR: INR: 1.43 (ref 0.00–1.49)

## 2012-10-14 NOTE — Assessment & Plan Note (Signed)
Does not appear to have any hemolysis currently. Will obtain LDH today to assess. Continue to monitor and leave patient on Plavix, ASA and Coumadin.

## 2012-10-14 NOTE — Assessment & Plan Note (Addendum)
NYHA II on VAD support. Volume status up, however better from last visit. He has not yet taken am medications including his 40 mg torsemide. Will add metolazone 2.5 mg today for a 1x dose. Patient to call if weight starts going back up or if he does not notice any weight loss or his LE edema improving. Will continue all other medications as prescribed. Awaiting labs INR, LDH, BMET, and CBC. Will fax results to [facility], Follow up in 3 months.   Patient is currently in the process of being evaluated for a heart transplant at [facility], however with past history needs 6 months or urine and serum nicotine, drug and alcohol screens. Have provided patient with a script to go to [facility] to have them obtained every month and will fax results to [facility]. Stressed the importance of adhering to his medications especially his coumadin and following up to get these labs in order to be considered for a heart.

## 2012-10-14 NOTE — Patient Instructions (Addendum)
Take metolazone 2.5 mg x 1 day.  Continue to take medications as prescribed.   Weigh daily.  Do the following things EVERYDAY: 1) Weigh yourself in the morning before breakfast. Write it down and keep it in a log. 2) Take your medicines as prescribed 3) Eat low salt foods-Limit salt (sodium) to 2000 mg per day.  4) Stay as active as you can everyday 5) Limit all fluids for the day to less than 2 liters  Follow up 3 months

## 2012-10-14 NOTE — Assessment & Plan Note (Signed)
Still abstaining from cigarettes, will start monthly cotinine screens for transplant evaluation.

## 2012-10-14 NOTE — Progress Notes (Addendum)
Patient ID: [name], male   DOB: [birth year], 52 y.o.   MRN: [id number] HPI: [name] is 52 y/o [place] American male with a history of severe CHF due to NICM EF 10-15%, and severe MR previously on home milrinone. He had a HM II LVAD placed at [facility] March 2013. His medical history also consists of CRI, NSVT s/p ICD and LV thrombus.   Recently seen at [name] and LFTs were mildly elevated and CR up slightly. Also INR low. Diuretics held and place on lovenox. [name] 02/[facility]12 from clinic for N/V and persistent cough and LFTs elevated and haptoglobin low. Admitted [name] 07/28/12 for hemolysis/thrombosis, LDH 1200's. His LDH came down to 513 and was started on Plavix along with ASA and coumadin.  Follow up: Doing well. Last visit with [name] they stopped ferrous sulfate. Patient currently being evaluated for transplant, however has to go 6 months with urine and drug screens. Denies SOB, orthopnea, or CP. Reports getting dizzy from time to time. Weight at home 178-182. Taking medications as prescribed. INR has still been jumping from sub-therapeutic to supra-therapeutic.   Denies LVAD alarms.  Denies driveline trauma, erythema or drainage.  Denies ICD shocks.   Reports taking Coumadin as prescribed and adherence to anticoagulation based dietary restrictions.  Denies bright red blood per rectum or melena, no dark urine or hematuria.    Past Medical History  Diagnosis Date  . CHF (congestive heart failure)     EF- 10-15  . Medically noncompliant   . Mitral regurgitation   . Tobacco user   . HTN (hypertension)   . AICD (automatic cardioverter/defibrillator) present   . GERD (gastroesophageal reflux disease)   . Substance abuse   . Chronic renal insufficiency   . Syncope   . Thrombus 08/06/2010  . SYSTOLIC HEART FAILURE, CHRONIC 09/22/2008    Qualifier: Diagnosis of  By: Gala Romney, MD, Trixie Dredge   . LV (left ventricular) mural thrombus 01/28/2011  . ICD - IN SITU 09/16/2008    Qualifier:  Diagnosis of  By: Wonda Amis    . MITRAL STENOSIS/ INSUFFICIENCY, NON-RHEUMATIC 09/22/2008    Qualifier: Diagnosis of  By: Gala Romney, MD, Trixie Dredge Hepatomegaly 09/16/2008    Qualifier: Diagnosis of  By: Wonda Amis    . High cholesterol 02/26/2012    "at one time"  . Sleep apnea   . Exertional dyspnea 02/26/2012  . History of blood transfusion 08/2011    "when I had heart pump"  . Migraines   . COMMON MIGRAINE 06/14/2009    Qualifier: Diagnosis of  By: Jonny Ruiz MD, Len Blalock   . History of gout 02/26/2012  . Depression   . Bipolar affective disorder 10/22/2011    pt denies this hx 02/26/2012    Current Outpatient Prescriptions  Medication Sig Dispense Refill  . allopurinol (ZYLOPRIM) 100 MG tablet Take 100 mg by mouth as needed. For gout      . amLODipine (NORVASC) 10 MG tablet Take 1 tablet (10 mg total) by mouth daily.  30 tablet  4  . aspirin EC 81 MG tablet Take 81 mg by mouth daily.      . carvedilol (COREG) 25 MG tablet Take 50 mg by mouth 2 (two) times daily with a meal.       . clopidogrel (PLAVIX) 75 MG tablet Take 1 tablet (75 mg total) by mouth daily with breakfast.  30 tablet  4  . ferrous gluconate (FERGON) 324 MG  tablet Take 324 mg by mouth daily with breakfast.       . hydrALAZINE (APRESOLINE) 100 MG tablet Take 1 tablet (100 mg total) by mouth 3 (three) times daily.  90 tablet  4  . losartan (COZAAR) 100 MG tablet Take 1 tablet (100 mg total) by mouth daily.  30 tablet  3  . Magnesium Oxide (MAG-OX 400 PO) Take 800 mg by mouth 2 (two) times daily.       . metolazone (ZAROXOLYN) 2.5 MG tablet Take 1 tablet (2.5 mg total) by mouth daily. As directed  6 tablet  0  . ranitidine (ZANTAC) 150 MG tablet Take 150 mg by mouth 2 (two) times daily as needed.       . torsemide (DEMADEX) 20 MG tablet Take 2 tablets (40 mg total) by mouth daily.  30 tablet  3  . warfarin (COUMADIN) 5 MG tablet Take 1.5 tablets (7.5 mg total) by mouth daily.  50 tablet  3  .  [DISCONTINUED] isosorbide mononitrate (IMDUR) 30 MG 24 hr tablet Take 1 tablet (30 mg total) by mouth daily.  60 tablet  6   No current facility-administered medications for this encounter.    Lexapro  REVIEW OF SYSTEMS: All systems negative except as listed in HPI, PMH and Problem list.   LVAD INTERROGATION:   HeartMate II LVAD:  Flow 4.7 liters/min, speed 9200, power 5.1, PI 7.2.    I reviewed the LVAD parameters from today, and compared the results to the patient's prior recorded data.  No programming changes were made.  The LVAD is functioning within specified parameters.  The patient performs LVAD self-test daily. Few PI events daily. His equipment check completed and is in good working order.  Back-up equipment present.   LVAD education done on emergency procedures and precautions and reviewed exit site care.   Physical Exam: Filed Vitals:   10/14/12 1111  BP: 90/1  Weight: 184 lb 8 oz (83.689 kg)  Last Weight: 187  GENERAL: Well appearing, male who presents to clinic today in no acute distress. HEENT: normal  NECK: Supple, JVP difficult to assess.  2+ bilaterally, no bruits.  No lymphadenopathy or thyromegaly appreciated.   CARDIAC: LVAD hum present.  LUNGS:  Clear to auscultation bilaterally.  ABDOMEN:  Soft, round, nontender, mild distention;  positive bowel sounds x4.     LVAD exit site: well-healed and incorporated.  Dressing dry and intact. Stabilization device present and accurately applied.  Driveline dressing is being changed daily per sterile technique. EXTREMITIES:  Warm and dry, no cyanosis, clubbing, rash or 2+ LE edema  NEUROLOGIC:  Alert and oriented x 4.  Gait steady.  No aphasia.  No dysarthria.  Affect pleasant.     ASSESSMENT AND PLAN:

## 2012-12-31 ENCOUNTER — Other Ambulatory Visit (HOSPITAL_COMMUNITY): Payer: Self-pay | Admitting: Anesthesiology

## 2013-01-07 IMAGING — CR DG CHEST 2V
2 series · 2 of 2 positions shown · non-contrast
Comparison: 09/08/2011

CLINICAL DATA: Headache, tachycardia, LVAD

CHEST - 2 VIEW

[w chest pa]
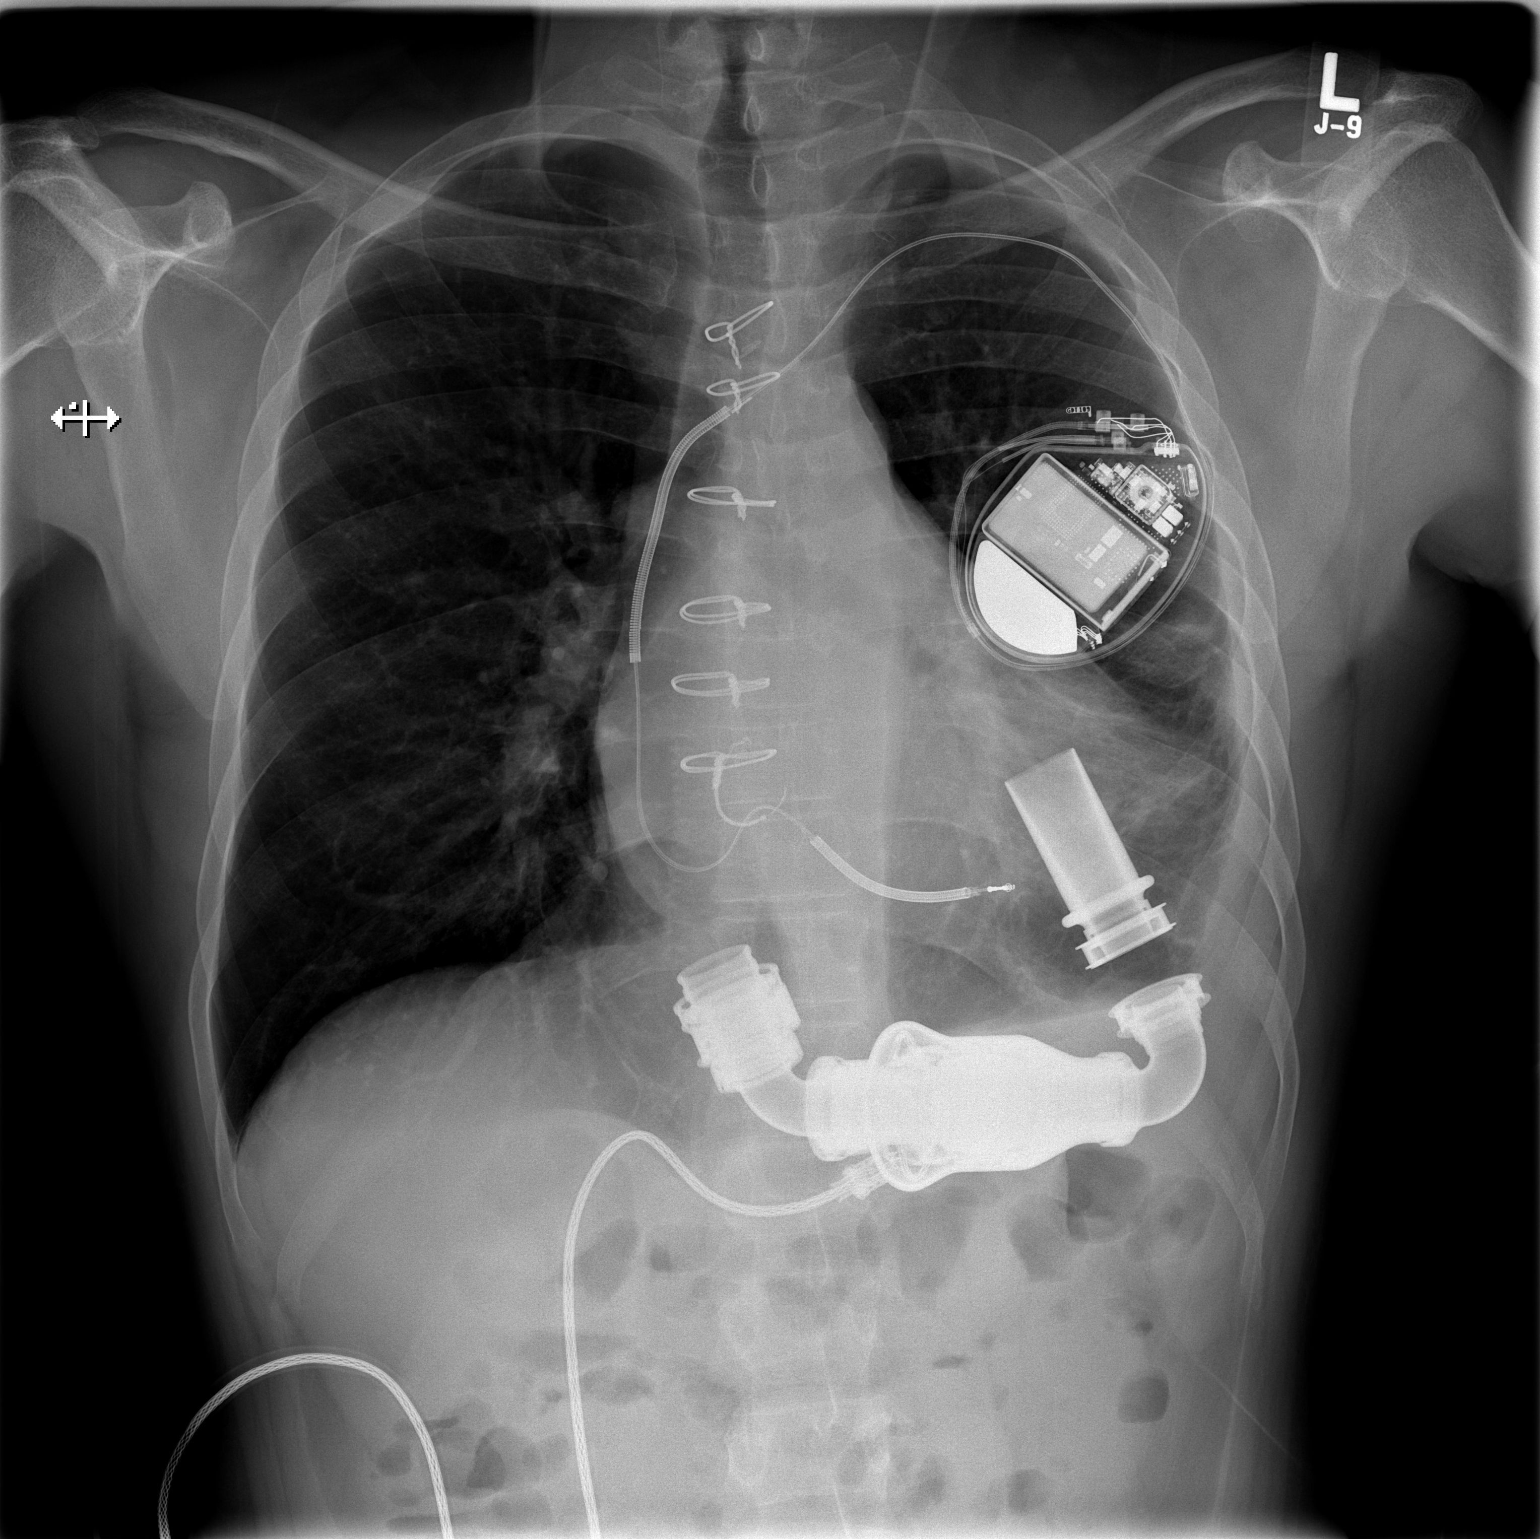

[w chest lat]
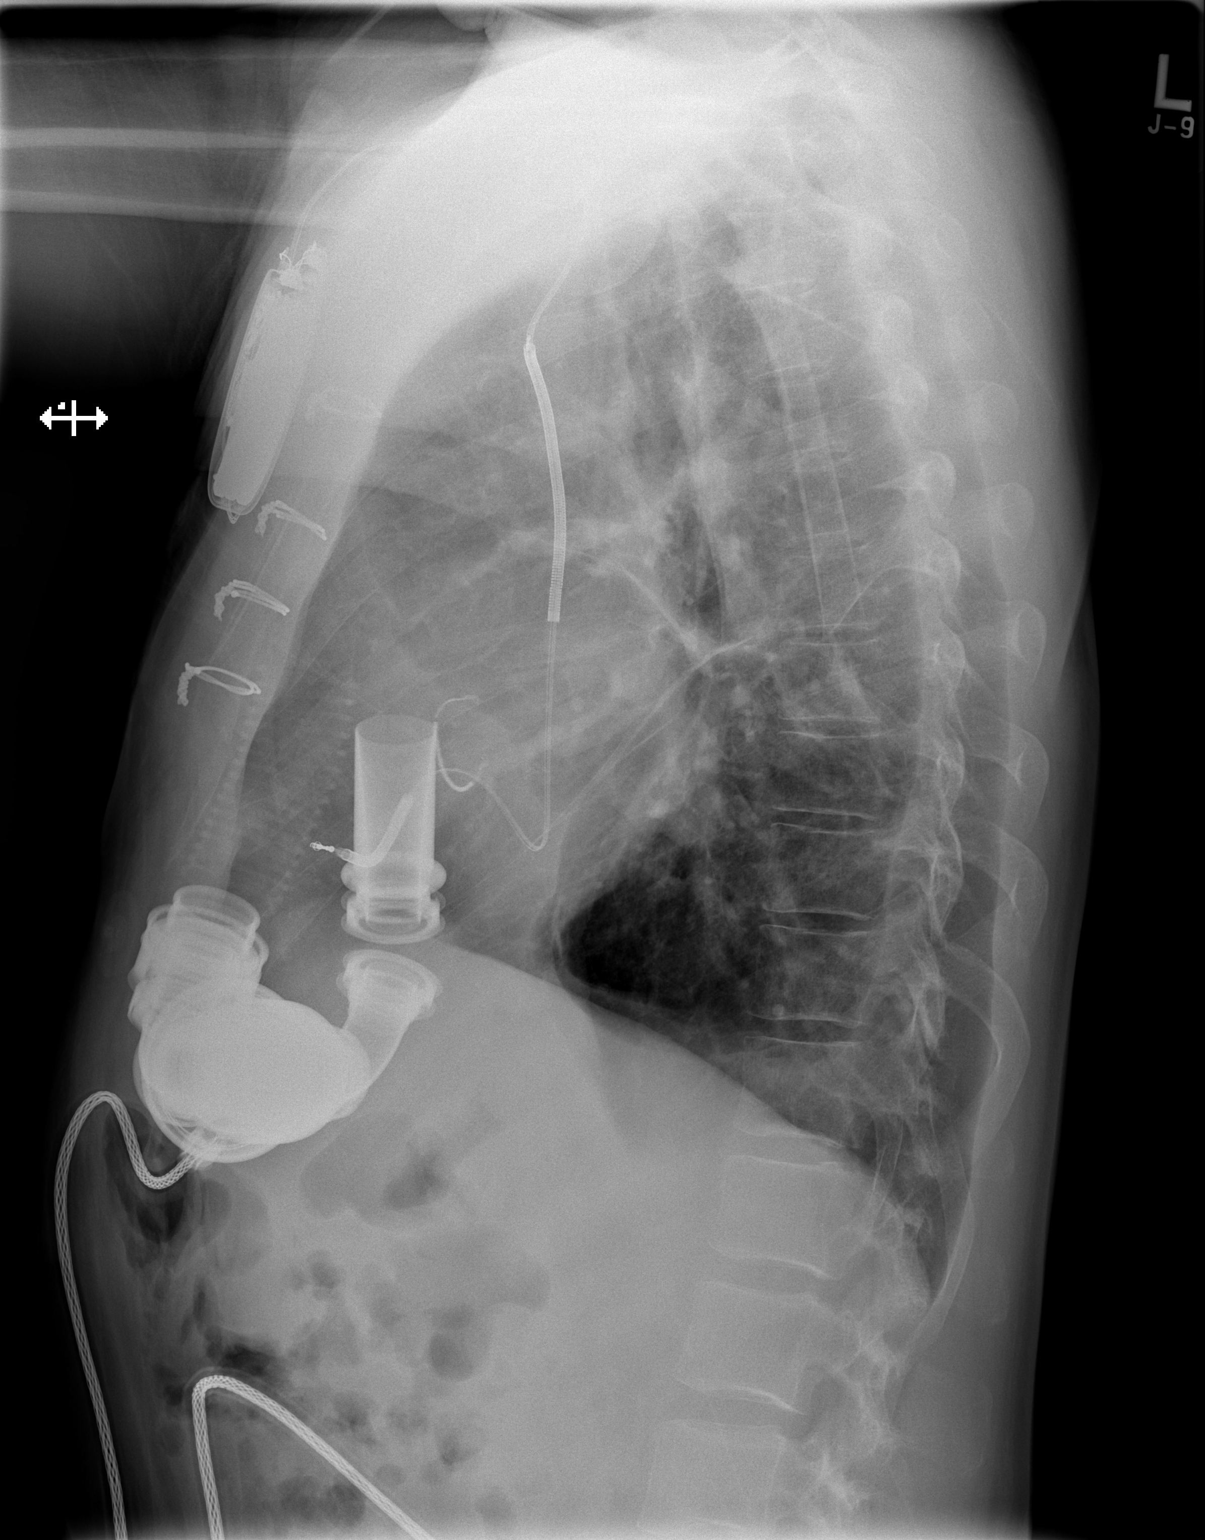

[2 of 2 positions shown; findings below may reference images not displayed]

FINDINGS: Cardiomegaly again noted.  The patient is status post
median sternotomy.  A left ventricular assist  device in place is
noted.  Small left pleural effusion with left basilar atelectasis.
No focal infiltrate.  Bony thorax is stable.  No pulmonary edema.
There is a single lead cardiac pacemaker unchanged in position.
IMPRESSION: Cardiomegaly.  Status post median sternotomy.  Stable single lead
cardiac pacemaker position.A left ventricular assist  device] in
place is noted.  Small left pleural effusion with left basilar
atelectasis.  No focal infiltrate or  pulmonary edema.

## 2013-01-19 ENCOUNTER — Encounter: Payer: Self-pay | Admitting: *Deleted

## 2013-03-01 ENCOUNTER — Other Ambulatory Visit (HOSPITAL_COMMUNITY): Payer: Self-pay | Admitting: Anesthesiology

## 2013-03-04 ENCOUNTER — Other Ambulatory Visit (HOSPITAL_COMMUNITY): Payer: Self-pay | Admitting: Anesthesiology

## 2013-03-23 ENCOUNTER — Encounter: Payer: Medicare Other | Admitting: Cardiology

## 2013-03-30 ENCOUNTER — Encounter: Payer: Self-pay | Admitting: Internal Medicine

## 2013-03-30 ENCOUNTER — Ambulatory Visit (INDEPENDENT_AMBULATORY_CARE_PROVIDER_SITE_OTHER): Payer: Medicare Other | Admitting: Cardiology

## 2013-03-30 ENCOUNTER — Encounter: Payer: Self-pay | Admitting: Cardiology

## 2013-03-30 VITALS — Ht 64.0 in | Wt 184.1 lb

## 2013-03-30 DIAGNOSIS — I472 Ventricular tachycardia: Secondary | ICD-10-CM

## 2013-03-30 DIAGNOSIS — I5022 Chronic systolic (congestive) heart failure: Secondary | ICD-10-CM

## 2013-03-30 DIAGNOSIS — Z95810 Presence of automatic (implantable) cardiac defibrillator: Secondary | ICD-10-CM

## 2013-03-30 DIAGNOSIS — I428 Other cardiomyopathies: Secondary | ICD-10-CM

## 2013-03-30 LAB — ICD DEVICE OBSERVATION
BRDY-0002RV: 40 {beats}/min
DEVICE MODEL ICD: 105117
RV LEAD THRESHOLD: 1.2 V
TZAT-0002SLOWVT: NEGATIVE
TZAT-0002SLOWVT: NEGATIVE
TZAT-0013FASTVT: 4
TZAT-0018FASTVT: NEGATIVE
TZAT-0018SLOWVT: NEGATIVE
TZST-0001FASTVT: 3
TZST-0001FASTVT: 4
TZST-0001FASTVT: 5
TZST-0001SLOWVT: 3
TZST-0001SLOWVT: 5
TZST-0002SLOWVT: NEGATIVE
TZST-0002SLOWVT: NEGATIVE
TZST-0003FASTVT: 31 J
TZST-0003FASTVT: 31 J

## 2013-03-30 NOTE — Progress Notes (Signed)
ELECTROPHYSIOLOGY OFFICE NOTE  Patient ID: [name] MRN: [id number], DOB/AGE: January 30, 1961   Date of Visit: 03/30/2013  Primary Physician: Oliver Barre, MD Primary Cardiologist / EP: Gala Romney, MD / Ladona Ridgel, MD Reason for Visit: EP/device follow up  History of Present Illness  [name] is a 52 y.o. male with NICM, EF 10-15%, chronic systolic HF and paroxysmal VT s/p ICD implant. He now has LVAD in place, implanted 2012. He presents today for routine electrophysiology followup. Since last being seen in our clinic, he reports he is doing well and has no complaints. His HF symptoms have improved with LVAD. He denies chest pain or shortness of breath. He denies palpitations, dizziness, near syncope or syncope. He denies LE swelling, orthopnea, PND or recent weight gain. He is compliant and tolerating medications without difficulty.  Past Medical History Past Medical History  Diagnosis Date  . CHF (congestive heart failure)     EF- 10-15  . Medically noncompliant   . Mitral regurgitation   . Tobacco user   . HTN (hypertension)   . AICD (automatic cardioverter/defibrillator) present   . GERD (gastroesophageal reflux disease)   . Substance abuse   . Chronic renal insufficiency   . Syncope   . Thrombus 08/06/2010  . SYSTOLIC HEART FAILURE, CHRONIC 09/22/2008    Qualifier: Diagnosis of  By: Gala Romney, MD, Trixie Dredge   . LV (left ventricular) mural thrombus 01/28/2011  . ICD - IN SITU 09/16/2008    Qualifier: Diagnosis of  By: Wonda Amis    . MITRAL STENOSIS/ INSUFFICIENCY, NON-RHEUMATIC 09/22/2008    Qualifier: Diagnosis of  By: Gala Romney, MD, Trixie Dredge Hepatomegaly 09/16/2008    Qualifier: Diagnosis of  By: Wonda Amis    . High cholesterol 02/26/2012    "at one time"  . Sleep apnea   . Exertional dyspnea 02/26/2012  . History of blood transfusion 08/2011    "when I had heart pump"  . Migraines   . COMMON MIGRAINE 06/14/2009    Qualifier: Diagnosis of   By: Jonny Ruiz MD, Len Blalock   . History of gout 02/26/2012  . Depression   . Bipolar affective disorder 10/22/2011    pt denies this hx 02/26/2012    Past Surgical History Past Surgical History  Procedure Laterality Date  . Cardiac defibrillator placement  ~ 2008  . Left ventricular assist device  08/2011    Allergies/Intolerances Allergies  Allergen Reactions  . Lexapro [Escitalopram Oxalate] Other (See Comments)    somnolence    Current Home Medications Current Outpatient Prescriptions  Medication Sig Dispense Refill  . allopurinol (ZYLOPRIM) 100 MG tablet Take 100 mg by mouth as needed. For gout      . aspirin EC 81 MG tablet Take 81 mg by mouth daily.      . carvedilol (COREG) 25 MG tablet Take 50 mg by mouth 2 (two) times daily with a meal.       . clopidogrel (PLAVIX) 75 MG tablet Take 1 tablet (75 mg total) by mouth daily with breakfast.  30 tablet  4  . hydrALAZINE (APRESOLINE) 100 MG tablet TAKE 1 TABLET (100 MG TOTAL) BY MOUTH 3 (THREE) TIMES DAILY.  90 tablet  4  . losartan (COZAAR) 100 MG tablet TAKE 1 TABLET (100 MG TOTAL) BY MOUTH DAILY.  30 tablet  6  . metolazone (ZAROXOLYN) 2.5 MG tablet Take 1 tablet (2.5 mg total) by mouth daily. As directed  6 tablet  0  .  ranitidine (ZANTAC) 150 MG tablet Take 150 mg by mouth 2 (two) times daily as needed.       . torsemide (DEMADEX) 20 MG tablet Take 2 tablets (40 mg total) by mouth daily.  30 tablet  3  . warfarin (COUMADIN) 5 MG tablet Take by mouth as directed.      . [DISCONTINUED] isosorbide mononitrate (IMDUR) 30 MG 24 hr tablet Take 1 tablet (30 mg total) by mouth daily.  60 tablet  6   No current facility-administered medications for this visit.    Social History Social History  . Marital Status: Divorced   Social History Main Topics  . Smoking status: Former Smoker -- 0.25 packs/day for 33 years    Types: Cigarettes    Quit date: 06/29/2011  . Smokeless tobacco: Former Neurosurgeon    Quit date: 06/29/2011  . Alcohol Use: No    . Drug Use: No   Review of Systems General: No chills, fever, night sweats or weight changes Cardiovascular: No chest pain, dyspnea on exertion, edema, orthopnea, palpitations, paroxysmal nocturnal dyspnea Dermatological: No rash, lesions or masses Respiratory: No cough, dyspnea Urologic: No hematuria, dysuria Abdominal: No nausea, vomiting, diarrhea, bright red blood per rectum, melena, or hematemesis Neurologic: No visual changes, weakness, changes in mental status All other systems reviewed and are otherwise negative except as noted above.  Physical Exam Vitals: SBP 98 (done using Doppler), pulse 80  General: Well developed, well appearing 52 y.o. male in no acute distress. HEENT: Normocephalic, atraumatic. EOMs intact. Sclera nonicteric. Oropharynx clear.  Neck: Supple. No JVD. Lungs: Respirations regular and unlabored, CTA bilaterally. No wheezes, rales or rhonchi. Heart: Continuous hum from LVAD noted. Abdomen: Soft, non-distended.  Extremities: No clubbing, cyanosis or edema. PT/Radials 2+ and equal bilaterally. Psych: Normal affect. Neuro: Alert and oriented X 3. Moves all extremities spontaneously.   Diagnostics Device interrogation today - Normal device function. Thresholds and sensing consistent with previous device measurements. Impedance trends stable over time. 24 VT-1 episodes, EGMs reviewed and consistent with NSVT. No evidence of sustained ventricular arrhythmias. Histogram distribution appropriate for patient and level of activity. No changes made this session. Device programmed at appropriate safety margins. Device programmed to optimize intrinsic conduction. Pt re-enrolled in remote follow-up.  Assessment and Plan 1. NICM s/p ICD implant Normal device function No programming changes made Re-enrolled for remote follow-up; will start ICD remote checks every 3 months Return for follow-up with Dr. Ladona Ridgel in one year 2. Paroxysmal VT Stable; asymptomatic Continue  BB  Signed, Cyd Hostler, PA-C 03/30/2013, 9:47 AM

## 2013-03-30 NOTE — Patient Instructions (Addendum)
Your physician wants you to follow-up in: 1 YEAR WITH DR. Court Joy will receive a reminder letter in the mail two months in advance. If you don't receive a letter, please call our office to schedule the follow-up appointment.  Your physician recommends that you schedule a follow-up appointment in:  3 MONTHS REMOTE DEFIB CHECK

## 2013-04-29 ENCOUNTER — Ambulatory Visit (HOSPITAL_COMMUNITY)
Admission: RE | Admit: 2013-04-29 | Discharge: 2013-04-29 | Disposition: A | Discharge status: Home / Self Care | Payer: Medicare Other | Source: Ambulatory Visit | Attending: Internal Medicine | Admitting: Internal Medicine

## 2013-04-29 ENCOUNTER — Other Ambulatory Visit (HOSPITAL_COMMUNITY): Payer: Self-pay | Admitting: *Deleted

## 2013-04-29 VITALS — BP 130/0 | HR 90 | Ht 64.0 in | Wt 188.0 lb

## 2013-04-29 DIAGNOSIS — Z95811 Presence of heart assist device: Secondary | ICD-10-CM

## 2013-04-29 DIAGNOSIS — I5022 Chronic systolic (congestive) heart failure: Secondary | ICD-10-CM

## 2013-04-29 DIAGNOSIS — Z7901 Long term (current) use of anticoagulants: Secondary | ICD-10-CM

## 2013-04-29 DIAGNOSIS — Z95818 Presence of other cardiac implants and grafts: Secondary | ICD-10-CM

## 2013-04-29 DIAGNOSIS — F17200 Nicotine dependence, unspecified, uncomplicated: Secondary | ICD-10-CM

## 2013-04-29 DIAGNOSIS — I10 Essential (primary) hypertension: Secondary | ICD-10-CM

## 2013-04-29 LAB — BASIC METABOLIC PANEL
BUN: 12 mg/dL (ref 6–23)
Chloride: 104 mEq/L (ref 96–112)
Creatinine, Ser: 1.2 mg/dL (ref 0.50–1.35)
GFR calc Af Amer: 80 mL/min — ABNORMAL LOW (ref >90)
GFR calc non Af Amer: 69 mL/min — ABNORMAL LOW (ref >90)
Sodium: 140 mEq/L (ref 135–145)

## 2013-04-29 LAB — CBC
HCT: 44.9 % (ref 39.0–52.0)
MCHC: 35.6 g/dL (ref 30.0–36.0)
MCV: 86 fL (ref 78.0–100.0)
Platelets: 259 10*3/uL (ref 150–400)
RDW: 15.1 % (ref 11.5–15.5)

## 2013-04-29 NOTE — Progress Notes (Signed)
HPI: [name] is 52 y/o [place] American male with a history of severe CHF due to NICM EF 10-15%, and severe MR previously on home milrinone. He had a HM II LVAD placed at [facility] March 2013. His medical history also consists of CRI, NSVT s/p ICD and LV thrombus.   Follow up: Doing well. Patient currently being evaluated for transplant, however has to go 6 months with urine and drug screen (which he reports he has been doing). Denies SOB, orthopnea, or CP. Weight at home 187. Taking medications as prescribed. Now has not INR machine. Having mild DOE with stairs. Now in school.   Denies LVAD alarms. Denies driveline trauma, erythema or drainage. Denies ICD shocks.   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   Reports taking Coumadin as prescribed and adherence to anticoagulation based dietary restrictions. Denies bright red blood per rectum or melena, no dark urine or hematuria.    Reports taking Coumadin as prescribed and adherence to anticoagulation based dietary restrictions.  Denies bright red blood per rectum or melena, no dark urine or hematuria.    Symptom  Yes  No  Details   Angina         x Activity:   Claudication         x How far:   Syncope         x When:   Stroke         x   Orthopnea         x How many pillows  one  PND         x How often   CPAP         x How many hrs   Pedal edema         x        @ 1x week; takes extra 40 demadex  Abd fullness         x   N&V         x   Good appetite; eats full meals  Diaphoresis         x when   Bleeding         x    Clear urine  SOB         x        Activity: incline; stairs  Palpitations         x        When: with activity  ICD shock         x   Hospitlizaitons         x When/where/why   ED visit         x When/where/why   Other MD         x   When/who/why: [facility] 01/26/13; Dr. Jinny Sanders 03/30/13 parox VT stable  Activity    School 3 x week;  chores; walking    Fluid    Limit when he "swells"  Diet    No added salt    Past Medical History  Diagnosis Date  . CHF (congestive heart failure)     EF- 10-15  . Medically noncompliant   . Mitral regurgitation   . Tobacco user   . HTN (hypertension)   . AICD (automatic cardioverter/defibrillator) present   . GERD (gastroesophageal reflux disease)   . Substance abuse   . Chronic renal insufficiency   . Syncope   .  Thrombus 08/06/2010  . SYSTOLIC HEART FAILURE, CHRONIC 09/22/2008    Qualifier: Diagnosis of  By: Gala Romney, MD, Trixie Dredge   . LV (left ventricular) mural thrombus 01/28/2011  . ICD - IN SITU 09/16/2008    Qualifier: Diagnosis of  By: Wonda Amis    . MITRAL STENOSIS/ INSUFFICIENCY, NON-RHEUMATIC 09/22/2008    Qualifier: Diagnosis of  By: Gala Romney, MD, Trixie Dredge Hepatomegaly 09/16/2008    Qualifier: Diagnosis of  By: Wonda Amis    . High cholesterol 02/26/2012    "at one time"  . Sleep apnea   . Exertional dyspnea 02/26/2012  . History of blood transfusion 08/2011    "when I had heart pump"  . Migraines   . COMMON MIGRAINE 06/14/2009    Qualifier: Diagnosis of  By: Jonny Ruiz MD, Len Blalock   . History of gout 02/26/2012  . Depression   . Bipolar affective disorder 10/22/2011    pt denies this hx 02/26/2012    Current Outpatient Prescriptions  Medication Sig Dispense Refill  . allopurinol (ZYLOPRIM) 100 MG tablet Take 100 mg by mouth as needed. For gout      . amLODipine (NORVASC) 10 MG tablet Take 10 mg by mouth daily.      Marland Kitchen aspirin EC 81 MG tablet Take 81 mg by mouth daily.      . carvedilol (COREG) 25 MG tablet Take 50 mg by mouth 2 (two) times daily with a meal.      . clopidogrel (PLAVIX) 75 MG tablet Take 1 tablet (75 mg total) by mouth daily with breakfast.  30 tablet  4  . dextromethorphan-guaiFENesin (ROBITUSSIN-DM) 10-100 MG/5ML liquid Take by mouth every 4 (four) hours as needed for cough.      . hydrALAZINE (APRESOLINE) 100 MG tablet TAKE 1 TABLET  (100 MG TOTAL) BY MOUTH 3 (THREE) TIMES DAILY.  90 tablet  4  . losartan (COZAAR) 100 MG tablet TAKE 1 TABLET (100 MG TOTAL) BY MOUTH DAILY.  30 tablet  6  . potassium chloride (K-DUR,KLOR-CON) 10 MEQ tablet Take 20 mEq by mouth daily.      . ranitidine (ZANTAC) 150 MG tablet Take 150 mg by mouth 2 (two) times daily as needed.       . torsemide (DEMADEX) 20 MG tablet Take 2 tablets (40 mg total) by mouth daily.  30 tablet  3  . warfarin (COUMADIN) 5 MG tablet Take by mouth as directed.      . metolazone (ZAROXOLYN) 2.5 MG tablet Take 1 tablet (2.5 mg total) by mouth daily. As directed  6 tablet  0  . [DISCONTINUED] isosorbide mononitrate (IMDUR) 30 MG 24 hr tablet Take 1 tablet (30 mg total) by mouth daily.  60 tablet  6   No current facility-administered medications for this encounter.    Lexapro  REVIEW OF SYSTEMS: All systems negative except as listed in HPI, PMH and Problem list.  LVAD INTERROGATION:   HeartMate II LVAD:   Flow:   4.9 Speed: 9180 Power:  5.3 PI:  7.2 Fixed speed: 9200 Low speed limit: 8400 Alarms:  none Events:  Mult low voltage advisories  I reviewed the LVAD parameters from today, and compared the results to the patient's prior recorded data.  No programming changes were made.  The LVAD is functioning within specified parameters.  The patient performs LVAD self-test daily.  LVAD interrogation was negative for any significant power changes, alarms or PI events/speed drops.  LVAD equipment  check completed and is in good working order.  Back-up equipment present.   LVAD education done on emergency procedures and precautions and reviewed exit site care.   Physical Exam: Filed Vitals:   04/29/13 1042  BP: 130/0  Pulse: 90  Height: 5\' 4"  (1.626 m)  Weight: 188 lb (85.276 kg)  SpO2: 98%    GENERAL: Well appearing, male who presents to clinic today in no acute distress. HEENT: normal  NECK: Supple, JVP flat;  2+ bilaterally, no bruits.  No lymphadenopathy or  thyromegaly appreciated.   CARDIAC:  Mechanical heart sounds with LVAD hum present.  LUNGS:  Clear to auscultation bilaterally.  ABDOMEN:  Soft, round, nontender, positive bowel sounds x4.     LVAD exit site: well-healed and incorporated.  Dressing dry and intact.  No erythema or drainage.  Stabilization device present and accurately applied.  Driveline dressing is being changed daily per sterile technique. EXTREMITIES:  Warm and dry, no cyanosis, clubbing, or rash; trace edema  NEUROLOGIC:  Alert and oriented x 4.  Gait steady.  No aphasia.  No dysarthria.  Affect pleasant.      ASSESSMENT AND PLAN:   1.Chronic systolic heart failure, s/p LVAD implant (08/2012) - NYHA II symptoms on LVAD support and volume status stable.  - MAP elevated 130, however patient reports not taking medications yet this morning. Will not titrate anything and have asked him to return to clinic next week for MAP check. If it remains elevated will adjust medications, can increase lostartan to 150 mg daily if Cr stable. - Reinforced the need and importance of daily weights, a low sodium diet, and fluid restriction (less than 2 L a day). Instructed to call the HF clinic if weight increases more than 3 lbs overnight or 5 lbs in a week.   2. LVAD - Parameters all stable and no red heart or yellow wrench alarms. Patient has had a couple of low voltage advisory warnings and the VAD Coordinator went over education with patient about switching batteries out before alarms. - Drivelinie exit site looks good, no s/s of infection. - awaiting labs (CBC, INR, LDH, and BMET)  3. HTN - MAP elevated. As above patient has not taken medications today and have asked him to come back to clinic next week after taking meds so we can reassess.   4.  Anticoagulation management - INR goal 2.0-3.0.  Will evaluate INR value today and will forward to [facility] who doses his coumadin.  5. Nicotine dependence - Patient is no longer smoking and has not  in quite awhile. He reports that he is taking monthly drug screens including nicotine for [facility] to be evaluated for tx.   F/U 3-4 months [name] 6:56 PM

## 2013-04-29 NOTE — Patient Instructions (Signed)
Take your medications when you get home and then come back in 2 weeks for blood pressure check after you take your morning meds.  Keep up the good work.  Do the following things EVERYDAY: 1) Weigh yourself in the morning before breakfast. Write it down and keep it in a log. 2) Take your medicines as prescribed 3) Eat low salt foods-Limit salt (sodium) to 2000 mg per day.  4) Stay as active as you can everyday 5) Limit all fluids for the day to less than 2 liters 6)

## 2013-05-06 ENCOUNTER — Ambulatory Visit (HOSPITAL_COMMUNITY)
Admission: RE | Admit: 2013-05-06 | Discharge: 2013-05-06 | Disposition: A | Discharge status: Home / Self Care | Payer: Medicare Other | Source: Ambulatory Visit | Attending: Internal Medicine | Admitting: Internal Medicine

## 2013-05-06 VITALS — BP 102/0 | HR 75 | Wt 179.5 lb

## 2013-05-06 DIAGNOSIS — I10 Essential (primary) hypertension: Secondary | ICD-10-CM

## 2013-05-06 DIAGNOSIS — Z95818 Presence of other cardiac implants and grafts: Secondary | ICD-10-CM

## 2013-05-06 DIAGNOSIS — I5022 Chronic systolic (congestive) heart failure: Secondary | ICD-10-CM

## 2013-05-06 DIAGNOSIS — Z95811 Presence of heart assist device: Secondary | ICD-10-CM

## 2013-05-06 NOTE — Progress Notes (Signed)
Pt in for BP check, he states he did take his medications today as directed, BP 102 much better than last week, pt will continue to take meds as directed

## 2013-07-15 ENCOUNTER — Encounter: Payer: Medicare Other | Admitting: *Deleted

## 2013-07-17 ENCOUNTER — Emergency Department (HOSPITAL_COMMUNITY): Payer: Medicare Other

## 2013-07-17 ENCOUNTER — Inpatient Hospital Stay (HOSPITAL_COMMUNITY)
Admission: EM | Admit: 2013-07-17 | Discharge: 2013-08-11 | DRG: 004 | Disposition: A | Discharge status: Rehabilitation Facility | Payer: Medicare Other | Attending: Internal Medicine | Admitting: Internal Medicine

## 2013-07-17 ENCOUNTER — Encounter (HOSPITAL_COMMUNITY): Payer: Self-pay | Admitting: Emergency Medicine

## 2013-07-17 ENCOUNTER — Other Ambulatory Visit: Payer: Self-pay

## 2013-07-17 DIAGNOSIS — N179 Acute kidney failure, unspecified: Secondary | ICD-10-CM | POA: Diagnosis not present

## 2013-07-17 DIAGNOSIS — D638 Anemia in other chronic diseases classified elsewhere: Secondary | ICD-10-CM | POA: Diagnosis present

## 2013-07-17 DIAGNOSIS — Z7901 Long term (current) use of anticoagulants: Secondary | ICD-10-CM

## 2013-07-17 DIAGNOSIS — J690 Pneumonitis due to inhalation of food and vomit: Secondary | ICD-10-CM | POA: Diagnosis not present

## 2013-07-17 DIAGNOSIS — E876 Hypokalemia: Secondary | ICD-10-CM | POA: Diagnosis not present

## 2013-07-17 DIAGNOSIS — I050 Rheumatic mitral stenosis: Secondary | ICD-10-CM | POA: Diagnosis present

## 2013-07-17 DIAGNOSIS — R1310 Dysphagia, unspecified: Secondary | ICD-10-CM | POA: Diagnosis present

## 2013-07-17 DIAGNOSIS — Z95810 Presence of automatic (implantable) cardiac defibrillator: Secondary | ICD-10-CM | POA: Diagnosis not present

## 2013-07-17 DIAGNOSIS — E46 Unspecified protein-calorie malnutrition: Secondary | ICD-10-CM | POA: Diagnosis present

## 2013-07-17 DIAGNOSIS — M109 Gout, unspecified: Secondary | ICD-10-CM | POA: Diagnosis present

## 2013-07-17 DIAGNOSIS — I5023 Acute on chronic systolic (congestive) heart failure: Secondary | ICD-10-CM

## 2013-07-17 DIAGNOSIS — Z7982 Long term (current) use of aspirin: Secondary | ICD-10-CM

## 2013-07-17 DIAGNOSIS — I428 Other cardiomyopathies: Secondary | ICD-10-CM | POA: Diagnosis present

## 2013-07-17 DIAGNOSIS — I4891 Unspecified atrial fibrillation: Secondary | ICD-10-CM | POA: Diagnosis present

## 2013-07-17 DIAGNOSIS — N189 Chronic kidney disease, unspecified: Secondary | ICD-10-CM | POA: Diagnosis present

## 2013-07-17 DIAGNOSIS — R1312 Dysphagia, oropharyngeal phase: Secondary | ICD-10-CM | POA: Diagnosis not present

## 2013-07-17 DIAGNOSIS — Y831 Surgical operation with implant of artificial internal device as the cause of abnormal reaction of the patient, or of later complication, without mention of misadventure at the time of the procedure: Secondary | ICD-10-CM | POA: Diagnosis present

## 2013-07-17 DIAGNOSIS — F1910 Other psychoactive substance abuse, uncomplicated: Secondary | ICD-10-CM | POA: Diagnosis present

## 2013-07-17 DIAGNOSIS — I472 Ventricular tachycardia, unspecified: Secondary | ICD-10-CM | POA: Diagnosis present

## 2013-07-17 DIAGNOSIS — R339 Retention of urine, unspecified: Secondary | ICD-10-CM | POA: Diagnosis not present

## 2013-07-17 DIAGNOSIS — R4182 Altered mental status, unspecified: Secondary | ICD-10-CM | POA: Diagnosis not present

## 2013-07-17 DIAGNOSIS — I5022 Chronic systolic (congestive) heart failure: Secondary | ICD-10-CM | POA: Diagnosis present

## 2013-07-17 DIAGNOSIS — K279 Peptic ulcer, site unspecified, unspecified as acute or chronic, without hemorrhage or perforation: Secondary | ICD-10-CM | POA: Diagnosis present

## 2013-07-17 DIAGNOSIS — R7309 Other abnormal glucose: Secondary | ICD-10-CM | POA: Diagnosis not present

## 2013-07-17 DIAGNOSIS — K219 Gastro-esophageal reflux disease without esophagitis: Secondary | ICD-10-CM | POA: Diagnosis present

## 2013-07-17 DIAGNOSIS — I6340 Cerebral infarction due to embolism of unspecified cerebral artery: Secondary | ICD-10-CM | POA: Diagnosis not present

## 2013-07-17 DIAGNOSIS — R059 Cough, unspecified: Secondary | ICD-10-CM

## 2013-07-17 DIAGNOSIS — R4701 Aphasia: Secondary | ICD-10-CM | POA: Diagnosis not present

## 2013-07-17 DIAGNOSIS — Z79899 Other long term (current) drug therapy: Secondary | ICD-10-CM

## 2013-07-17 DIAGNOSIS — R509 Fever, unspecified: Secondary | ICD-10-CM | POA: Diagnosis not present

## 2013-07-17 DIAGNOSIS — I129 Hypertensive chronic kidney disease with stage 1 through stage 4 chronic kidney disease, or unspecified chronic kidney disease: Secondary | ICD-10-CM | POA: Diagnosis present

## 2013-07-17 DIAGNOSIS — T82897A Other specified complication of cardiac prosthetic devices, implants and grafts, initial encounter: Secondary | ICD-10-CM | POA: Diagnosis present

## 2013-07-17 DIAGNOSIS — I4729 Other ventricular tachycardia: Secondary | ICD-10-CM | POA: Diagnosis present

## 2013-07-17 DIAGNOSIS — I252 Old myocardial infarction: Secondary | ICD-10-CM

## 2013-07-17 DIAGNOSIS — I509 Heart failure, unspecified: Secondary | ICD-10-CM

## 2013-07-17 DIAGNOSIS — Z7902 Long term (current) use of antithrombotics/antiplatelets: Secondary | ICD-10-CM

## 2013-07-17 DIAGNOSIS — R570 Cardiogenic shock: Secondary | ICD-10-CM | POA: Diagnosis not present

## 2013-07-17 DIAGNOSIS — F319 Bipolar disorder, unspecified: Secondary | ICD-10-CM | POA: Diagnosis present

## 2013-07-17 DIAGNOSIS — R05 Cough: Secondary | ICD-10-CM

## 2013-07-17 DIAGNOSIS — IMO0002 Reserved for concepts with insufficient information to code with codable children: Secondary | ICD-10-CM

## 2013-07-17 DIAGNOSIS — Z889 Allergy status to unspecified drugs, medicaments and biological substances status: Secondary | ICD-10-CM

## 2013-07-17 DIAGNOSIS — Z87891 Personal history of nicotine dependence: Secondary | ICD-10-CM | POA: Diagnosis not present

## 2013-07-17 DIAGNOSIS — E785 Hyperlipidemia, unspecified: Secondary | ICD-10-CM | POA: Diagnosis present

## 2013-07-17 DIAGNOSIS — R040 Epistaxis: Secondary | ICD-10-CM | POA: Diagnosis not present

## 2013-07-17 DIAGNOSIS — E875 Hyperkalemia: Secondary | ICD-10-CM | POA: Diagnosis present

## 2013-07-17 DIAGNOSIS — G8190 Hemiplegia, unspecified affecting unspecified side: Secondary | ICD-10-CM | POA: Diagnosis present

## 2013-07-17 DIAGNOSIS — G4733 Obstructive sleep apnea (adult) (pediatric): Secondary | ICD-10-CM | POA: Diagnosis present

## 2013-07-17 DIAGNOSIS — I8290 Acute embolism and thrombosis of unspecified vein: Secondary | ICD-10-CM

## 2013-07-17 DIAGNOSIS — J9600 Acute respiratory failure, unspecified whether with hypoxia or hypercapnia: Secondary | ICD-10-CM | POA: Diagnosis not present

## 2013-07-17 DIAGNOSIS — Z91199 Patient's noncompliance with other medical treatment and regimen due to unspecified reason: Secondary | ICD-10-CM

## 2013-07-17 DIAGNOSIS — E7800 Pure hypercholesterolemia, unspecified: Secondary | ICD-10-CM | POA: Diagnosis present

## 2013-07-17 DIAGNOSIS — R079 Chest pain, unspecified: Secondary | ICD-10-CM | POA: Diagnosis present

## 2013-07-17 DIAGNOSIS — I214 Non-ST elevation (NSTEMI) myocardial infarction: Secondary | ICD-10-CM | POA: Diagnosis present

## 2013-07-17 DIAGNOSIS — G43009 Migraine without aura, not intractable, without status migrainosus: Secondary | ICD-10-CM | POA: Diagnosis present

## 2013-07-17 DIAGNOSIS — I513 Intracardiac thrombosis, not elsewhere classified: Secondary | ICD-10-CM

## 2013-07-17 DIAGNOSIS — Z930 Tracheostomy status: Secondary | ICD-10-CM

## 2013-07-17 DIAGNOSIS — R319 Hematuria, unspecified: Secondary | ICD-10-CM | POA: Diagnosis not present

## 2013-07-17 DIAGNOSIS — I639 Cerebral infarction, unspecified: Secondary | ICD-10-CM

## 2013-07-17 DIAGNOSIS — Z95811 Presence of heart assist device: Secondary | ICD-10-CM

## 2013-07-17 DIAGNOSIS — Z9119 Patient's noncompliance with other medical treatment and regimen: Secondary | ICD-10-CM

## 2013-07-17 LAB — MRSA PCR SCREENING: MRSA by PCR: NEGATIVE

## 2013-07-17 LAB — COMPREHENSIVE METABOLIC PANEL
ALBUMIN: 4.2 g/dL (ref 3.5–5.2)
ALT: 31 U/L (ref 0–53)
AST: 152 U/L — ABNORMAL HIGH (ref 0–37)
Alkaline Phosphatase: 87 U/L (ref 39–117)
BILIRUBIN TOTAL: 0.6 mg/dL (ref 0.3–1.2)
BUN: 15 mg/dL (ref 6–23)
CO2: 25 mEq/L (ref 19–32)
Calcium: 9.6 mg/dL (ref 8.4–10.5)
Chloride: 98 mEq/L (ref 96–112)
Creatinine, Ser: 1.46 mg/dL — ABNORMAL HIGH (ref 0.50–1.35)
GFR calc Af Amer: 63 mL/min — ABNORMAL LOW (ref >90)
GFR calc non Af Amer: 54 mL/min — ABNORMAL LOW (ref >90)
Glucose, Bld: 179 mg/dL — ABNORMAL HIGH (ref 70–99)
POTASSIUM: 5.8 meq/L — AB (ref 3.7–5.3)
Sodium: 138 mEq/L (ref 137–147)
TOTAL PROTEIN: 8.8 g/dL — AB (ref 6.0–8.3)

## 2013-07-17 LAB — CBC WITH DIFFERENTIAL/PLATELET
BASOS PCT: 0 % (ref 0–1)
Basophils Absolute: 0 10*3/uL (ref 0.0–0.1)
EOS ABS: 0.1 10*3/uL (ref 0.0–0.7)
Eosinophils Relative: 1 % (ref 0–5)
HCT: 49.5 % (ref 39.0–52.0)
HEMOGLOBIN: 17.3 g/dL — AB (ref 13.0–17.0)
LYMPHS ABS: 1.3 10*3/uL (ref 0.7–4.0)
Lymphocytes Relative: 22 % (ref 12–46)
MCH: 30.7 pg (ref 26.0–34.0)
MCHC: 34.9 g/dL (ref 30.0–36.0)
MCV: 87.9 fL (ref 78.0–100.0)
Monocytes Absolute: 0.5 10*3/uL (ref 0.1–1.0)
Monocytes Relative: 8 % (ref 3–12)
NEUTROS ABS: 3.8 10*3/uL (ref 1.7–7.7)
NEUTROS PCT: 68 % (ref 43–77)
Platelets: 269 10*3/uL (ref 150–400)
RBC: 5.63 MIL/uL (ref 4.22–5.81)
RDW: 15.7 % — ABNORMAL HIGH (ref 11.5–15.5)
WBC: 5.7 10*3/uL (ref 4.0–10.5)

## 2013-07-17 LAB — PROTIME-INR
INR: 1.91 — ABNORMAL HIGH (ref 0.00–1.49)
Prothrombin Time: 21.3 seconds — ABNORMAL HIGH (ref 11.6–15.2)

## 2013-07-17 LAB — TROPONIN I
TROPONIN I: 17.02 ng/mL — AB (ref <0.30)
Troponin I: 12.76 ng/mL (ref <0.30)
Troponin I: 19.35 ng/mL (ref <0.30)

## 2013-07-17 LAB — LACTATE DEHYDROGENASE: LDH: 976 U/L — ABNORMAL HIGH (ref 94–250)

## 2013-07-17 LAB — HEPARIN LEVEL (UNFRACTIONATED): HEPARIN UNFRACTIONATED: 0.1 [IU]/mL — AB (ref 0.30–0.70)

## 2013-07-17 LAB — PRO B NATRIURETIC PEPTIDE: PRO B NATRI PEPTIDE: 2538 pg/mL — AB (ref 0–125)

## 2013-07-17 MED ORDER — NITROGLYCERIN IN D5W 200-5 MCG/ML-% IV SOLN
INTRAVENOUS | Status: AC
Start: 2013-07-17 — End: 2013-08-11
  Administered 2013-07-17: 30 ug
  Filled 2013-07-17: qty 250

## 2013-07-17 MED ORDER — NITROGLYCERIN IN D5W 200-5 MCG/ML-% IV SOLN: 2.0000 ug/min | INTRAVENOUS | Status: DC

## 2013-07-17 MED ORDER — MAGNESIUM OXIDE 400 (241.3 MG) MG PO TABS
400.0000 mg | ORAL_TABLET | Freq: Every day | ORAL | Status: DC
  Administered 2013-07-17 – 2013-08-11 (×23): 400 mg via ORAL
  Filled 2013-07-17 (×26): qty 1

## 2013-07-17 MED ORDER — HYDRALAZINE HCL 50 MG PO TABS
100.0000 mg | ORAL_TABLET | Freq: Three times a day (TID) | ORAL | Status: DC
  Administered 2013-07-17: 100 mg via ORAL
  Filled 2013-07-17 (×3): qty 2

## 2013-07-17 MED ORDER — MAGNESIUM OXIDE 400 MG PO TABS: 400.0000 mg | ORAL_TABLET | Freq: Every day | ORAL | Status: DC

## 2013-07-17 MED ORDER — MORPHINE SULFATE 4 MG/ML IJ SOLN
4.0000 mg | Freq: Once | INTRAMUSCULAR | Status: AC
  Administered 2013-07-17: 4 mg via INTRAVENOUS

## 2013-07-17 MED ORDER — SODIUM CHLORIDE 0.9 % IV BOLUS (SEPSIS)
500.0000 mL | Freq: Once | INTRAVENOUS | Status: AC
  Administered 2013-07-17: 500 mL via INTRAVENOUS

## 2013-07-17 MED ORDER — MORPHINE SULFATE 4 MG/ML IJ SOLN
INTRAMUSCULAR | Status: AC
  Filled 2013-07-17: qty 1

## 2013-07-17 MED ORDER — ASPIRIN EC 81 MG PO TBEC
81.0000 mg | DELAYED_RELEASE_TABLET | Freq: Every day | ORAL | Status: DC
  Administered 2013-07-17: 81 mg via ORAL
  Filled 2013-07-17 (×2): qty 1

## 2013-07-17 MED ORDER — CARVEDILOL 25 MG PO TABS
50.0000 mg | ORAL_TABLET | Freq: Two times a day (BID) | ORAL | Status: DC
  Filled 2013-07-17 (×2): qty 2

## 2013-07-17 MED ORDER — ASPIRIN 81 MG PO CHEW
324.0000 mg | CHEWABLE_TABLET | Freq: Once | ORAL | Status: AC
  Administered 2013-07-17: 324 mg via ORAL
  Filled 2013-07-17: qty 4

## 2013-07-17 MED ORDER — ONDANSETRON HCL 4 MG/2ML IJ SOLN: 4.0000 mg | Freq: Four times a day (QID) | INTRAMUSCULAR | Status: DC | PRN

## 2013-07-17 MED ORDER — HEPARIN (PORCINE) IN NACL 100-0.45 UNIT/ML-% IJ SOLN
900.0000 [IU]/h | INTRAMUSCULAR | Status: DC
  Administered 2013-07-17: 900 [IU]/h via INTRAVENOUS
  Filled 2013-07-17 (×4): qty 250

## 2013-07-17 MED ORDER — NITROGLYCERIN 0.4 MG SL SUBL
0.4000 mg | SUBLINGUAL_TABLET | SUBLINGUAL | Status: DC | PRN
Start: 2013-07-17 — End: 2013-08-11
  Administered 2013-07-17 (×2): 0.4 mg via SUBLINGUAL
  Filled 2013-07-17: qty 25

## 2013-07-17 MED ORDER — FAMOTIDINE 20 MG PO TABS
20.0000 mg | ORAL_TABLET | Freq: Every day | ORAL | Status: DC
  Administered 2013-07-17 – 2013-07-18 (×2): 20 mg via ORAL
  Filled 2013-07-17 (×4): qty 1

## 2013-07-17 MED ORDER — HYDRALAZINE HCL 100 MG PO TABS: 100.0000 mg | ORAL_TABLET | Freq: Three times a day (TID) | ORAL | Status: DC

## 2013-07-17 MED ORDER — SODIUM CHLORIDE 0.9 % IJ SOLN: 3.0000 mL | INTRAMUSCULAR | Status: DC | PRN

## 2013-07-17 MED ORDER — AMLODIPINE BESYLATE 10 MG PO TABS
10.0000 mg | ORAL_TABLET | Freq: Every day | ORAL | Status: DC
  Administered 2013-07-17: 10 mg via ORAL
  Filled 2013-07-17: qty 1

## 2013-07-17 MED ORDER — TORSEMIDE 20 MG PO TABS
40.0000 mg | ORAL_TABLET | Freq: Every day | ORAL | Status: DC
  Administered 2013-07-17 – 2013-07-18 (×2): 40 mg via ORAL
  Filled 2013-07-17 (×2): qty 2

## 2013-07-17 MED ORDER — CARVEDILOL 3.125 MG PO TABS
3.1250 mg | ORAL_TABLET | Freq: Two times a day (BID) | ORAL | Status: DC
  Filled 2013-07-17 (×3): qty 1

## 2013-07-17 MED ORDER — ALLOPURINOL 100 MG PO TABS
100.0000 mg | ORAL_TABLET | Freq: Every day | ORAL | Status: DC
  Administered 2013-07-17 – 2013-08-11 (×23): 100 mg via ORAL
  Filled 2013-07-17 (×26): qty 1

## 2013-07-17 MED ORDER — CLOPIDOGREL BISULFATE 75 MG PO TABS
75.0000 mg | ORAL_TABLET | Freq: Every day | ORAL | Status: DC
Start: 2013-07-18 — End: 2013-07-20
  Administered 2013-07-18 – 2013-07-19 (×2): 75 mg via ORAL
  Filled 2013-07-17 (×4): qty 1

## 2013-07-17 MED ORDER — GABAPENTIN 300 MG PO CAPS
300.0000 mg | ORAL_CAPSULE | Freq: Three times a day (TID) | ORAL | Status: DC
  Administered 2013-07-17 – 2013-07-18 (×4): 300 mg via ORAL
  Filled 2013-07-17 (×11): qty 1

## 2013-07-17 MED ORDER — ACETAMINOPHEN 325 MG PO TABS
650.0000 mg | ORAL_TABLET | ORAL | Status: DC | PRN
  Administered 2013-07-21: 650 mg via ORAL
  Filled 2013-07-17: qty 2

## 2013-07-17 MED ORDER — SODIUM CHLORIDE 0.9 % IJ SOLN
3.0000 mL | Freq: Two times a day (BID) | INTRAMUSCULAR | Status: DC
  Administered 2013-07-18: 3 mL via INTRAVENOUS

## 2013-07-17 MED ORDER — FUROSEMIDE 10 MG/ML IJ SOLN
40.0000 mg | Freq: Once | INTRAMUSCULAR | Status: AC
  Administered 2013-07-17: 40 mg via INTRAVENOUS
  Filled 2013-07-17: qty 4

## 2013-07-17 MED ORDER — DEXTROSE 5 % IV SOLN
2.0000 ug/min | INTRAVENOUS | Status: DC
  Administered 2013-07-17: 10 ug/min via INTRAVENOUS
  Administered 2013-07-18: 8 ug/min via INTRAVENOUS
  Administered 2013-07-19 (×2): 14 ug/min via INTRAVENOUS
  Administered 2013-07-20 (×2): 12 ug/min via INTRAVENOUS
  Administered 2013-07-20 (×2): 15 ug/min via INTRAVENOUS
  Filled 2013-07-17 (×12): qty 4

## 2013-07-17 MED ORDER — SODIUM CHLORIDE 0.9 % IV SOLN: 250.0000 mL | INTRAVENOUS | Status: DC | PRN

## 2013-07-17 NOTE — Progress Notes (Signed)
Patient arrived from home via EMS, spoke with [facility], LVAD cordinator, about patient.  Patient placed on [facility] power adapter.  Patient did not bring his home pack with him, family called by EMS and informed to bring LVAD pack up to hospital.  Patients batteries placed on LVAD cart at bedside.  Patient in NAD RN to call if assistance needed

## 2013-07-17 NOTE — ED Notes (Signed)
Received pt from home with c/o intermittent chest pain onset Thursday. Pt was lying on the floor upon arrival of EMS. Pt was diaphoretic, c/o dizziness. Symptoms ongoing intermittently for 3 days increased today.

## 2013-07-17 NOTE — Progress Notes (Signed)
Pt c/o 10/10 chest pain, pressure mid sternal to left chest, diaphretic. Nitro given x2. MD Bensimhon notified and 4mg  Morphine ordered, Nitro gtt. After Morphine and Nitro, Pt MAP dropped into the 50s. MD Bensimhon notified again, Low flow alarm, Nitro stopped, 500 cc bolus ordered. Pulsitile index 2.6. Will cont to monitor pt. 1800-Pain level 7/10. [name] L

## 2013-07-17 NOTE — H&P (Addendum)
Advanced Heart Failure Team History and Physical Note    Primary Cardiologist:  Bensimhon  Reason for Admission: NSTEMI   HPI:    [name] is 53 y/o [place] American male with a history of severe CHF due to NICM EF 10-15%, and severe MR s/p HM II LVAD placed at [facility] March 2013. His medical history also consists of CRI, NSVT s/p ICD and LV thrombus.   Has been doing very well. Being followed at [facility]. Pending work-up for transplant listing currently. On Thursday and Friday had one episode of central CP each day at rest associates with mild diaphoresis. Resolved spontaneously. This am severe episode of CP during sex. With severe diaphoresis and radiation to R arm. Noted pump flow transiently < 2 but back up quickly. Came to ER CP resolved. Received ASA.  ECG unchanged from previous. Initial troponin 12.   Says INR has been low. (checks at home). Thurs INR 1.2 took 10mg  warfarin in (typical dose is 5mg ) Fri INR 1.5 took 6mg  warfarin. INR today 1.9. No TIA or neuro symptoms. LDH pending. No bleeding  Denies LVAD alarms. Denies driveline trauma, erythema or drainage. Denies ICD shocks.  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    VAD interrogated personally. Flow 3.1 Speed 9400 PI 4.0 Power 4.7 no events or alarms  Review of Systems: [y] = yes, [ ]  = no   General: Weight gain [ ] ; Weight loss [ ] ; Anorexia [ ] ; Fatigue [ ] ; Fever [ ] ; Chills [ ] ; Weakness [ ]   Cardiac: Chest pain/pressure [place] ]; Resting SOB [ ] ; Exertional SOB [ ] ; Orthopnea [ ] ; Pedal Edema [ ] ; Palpitations [ ] ; Syncope [ ] ; Presyncope [ ] ; Paroxysmal nocturnal dyspnea[ ]   Pulmonary: Cough [ ] ; Wheezing[ ] ; Hemoptysis[ ] ; Sputum [ ] ; Snoring [ ]   GI: Vomiting[ ] ; Dysphagia[ ] ; Melena[ ] ; Hematochezia [ ] ; Heartburn[ ] ; Abdominal pain [ ] ; Constipation [ ] ; Diarrhea [ ] ; BRBPR [ ]   GU: Hematuria[ ] ; Dysuria [ ] ; Nocturia[ ]   Vascular: Pain in legs with  walking [ ] ; Pain in feet with lying flat [ ] ; Non-healing sores [ ] ; Stroke [ ] ; TIA [ ] ; Slurred speech [ ] ;  Neuro: Headaches[ ] ; Vertigo[ ] ; Seizures[ ] ; Paresthesias[ ] ;Blurred vision [ ] ; Diplopia [ ] ; Vision changes [ ]   Ortho/Skin: Arthritis [ ] ; Joint pain [ ] ; Muscle pain [ ] ; Joint swelling [ ] ; Back Pain [ ] ; Rash [ ]   Psych: Depression[ ] ; Anxiety[ ]   Heme: Bleeding problems [ ] ; Clotting disorders [ ] ; Anemia [ ]   Endocrine: Diabetes [ ] ; Thyroid dysfunction[ ]   Home Medications Prior to Admission medications   Medication Sig Start Date End Date Taking? Authorizing Provider  amLODipine (NORVASC) 10 MG tablet Take 10 mg by mouth daily.   Yes Historical Provider, MD  aspirin EC 81 MG tablet Take 81 mg by mouth daily.   Yes Historical Provider, MD  carvedilol (COREG) 25 MG tablet Take 50 mg by mouth 2 (two) times daily with a meal.   Yes Historical Provider, MD  clopidogrel (PLAVIX) 75 MG tablet Take 1 tablet (75 mg total) by mouth daily with breakfast. 08/08/12  Yes [name], NP  gabapentin (NEURONTIN) 300 MG capsule Take 300 mg by mouth 3 (three) times daily.   Yes Historical Provider, MD  hydrALAZINE (APRESOLINE) 100 MG tablet Take 100 mg by mouth 3 (three) times daily.  Yes Historical Provider, MD  losartan (COZAAR) 100 MG tablet Take 100 mg by mouth daily.   Yes Historical Provider, MD  magnesium oxide (MAG-OX) 400 MG tablet Take 400 mg by mouth daily.   Yes Historical Provider, MD  potassium chloride (K-DUR,KLOR-CON) 10 MEQ tablet Take 20 mEq by mouth daily.   Yes Historical Provider, MD  ranitidine (ZANTAC) 150 MG tablet Take 150 mg by mouth 2 (two) times daily.    Yes Historical Provider, MD  torsemide (DEMADEX) 20 MG tablet Take 2 tablets (40 mg total) by mouth daily. 09/14/12 09/14/13 Yes [name], NP  warfarin (COUMADIN) 5 MG tablet Take 5 mg by mouth daily.  08/08/12  Yes [name], NP  allopurinol (ZYLOPRIM) 100 MG tablet Take 100 mg by mouth as needed. For  gout    Historical Provider, MD    Past Medical History: Past Medical History  Diagnosis Date  . CHF (congestive heart failure)     EF- 10-15  . Medically noncompliant   . Mitral regurgitation   . Tobacco user   . HTN (hypertension)   . AICD (automatic cardioverter/defibrillator) present   . GERD (gastroesophageal reflux disease)   . Substance abuse   . Chronic renal insufficiency   . Syncope   . Thrombus 08/06/2010  . SYSTOLIC HEART FAILURE, CHRONIC 09/22/2008    Qualifier: Diagnosis of  By: Gala Romney, MD, Trixie Dredge   . LV (left ventricular) mural thrombus 01/28/2011  . ICD - IN SITU 09/16/2008    Qualifier: Diagnosis of  By: Wonda Amis    . MITRAL STENOSIS/ INSUFFICIENCY, NON-RHEUMATIC 09/22/2008    Qualifier: Diagnosis of  By: Gala Romney, MD, Trixie Dredge Hepatomegaly 09/16/2008    Qualifier: Diagnosis of  By: Wonda Amis    . High cholesterol 02/26/2012    "at one time"  . Sleep apnea   . Exertional dyspnea 02/26/2012  . History of blood transfusion 08/2011    "when I had heart pump"  . Migraines   . COMMON MIGRAINE 06/14/2009    Qualifier: Diagnosis of  By: Jonny Ruiz MD, Len Blalock   . History of gout 02/26/2012  . Depression   . Bipolar affective disorder 10/22/2011    pt denies this hx 02/26/2012    Past Surgical History: Past Surgical History  Procedure Laterality Date  . Cardiac defibrillator placement  ~ 2008  . Left ventricular assist device  08/2011    Family History: Family History  Problem Relation Age of Onset  . Coronary artery disease Neg Hx     Social History: History   Social History  . Marital Status: Divorced    Spouse Name: N/A    Number of Children: N/A  . Years of Education: N/A   Social History Main Topics  . Smoking status: Former Smoker -- 0.25 packs/day for 33 years    Types: Cigarettes    Quit date: 06/29/2011  . Smokeless tobacco: Former Neurosurgeon    Quit date: 06/29/2011  . Alcohol Use: No  . Drug Use: No      Comment: denies  . Sexual Activity: Not Currently   Other Topics Concern  . None   Social History Narrative  . None    Allergies:  Allergies  Allergen Reactions  . Ace Inhibitors Cough  . Lexapro [Escitalopram Oxalate] Other (See Comments)    somnolence    Objective:    Vital Signs:   Temp:  [97.7 F (36.5 C)] 97.7  F (36.5 C) (01/24 0919) Resp:  [20] 20 (01/24 0916) SpO2:  [97 %-99 %] 99 % (01/24 0919) Weight:  [83.008 kg (183 lb)] 83.008 kg (183 lb) (01/24 0916)   Filed Weights   07/17/13 0916  Weight: 83.008 kg (183 lb)   MAP 80  Physical Exam: GENERAL: Well appearing, male who presents to clinic today in no acute distress.  HEENT: normal  NECK: Supple, JVP flat; 2+ bilaterally, no bruits. No lymphadenopathy or thyromegaly appreciated.  CARDIAC: Mechanical heart sounds with LVAD hum present.  LUNGS: Clear to auscultation bilaterally.  ABDOMEN: Soft, round, nontender, positive bowel sounds x4.  LVAD exit site: well-healed and incorporated. Dressing dry and intact. No erythema or drainage. Stabilization device present and accurately applied. EXTREMITIES: Warm and dry, no cyanosis, clubbing, or rash; trace edema  NEUROLOGIC: Alert and oriented x 4. Gait steady. No aphasia. No dysarthria. Affect pleasant.     Telemetry: Tele  Labs: Basic Metabolic Panel:  Recent Labs Lab 07/17/13 0923  NA 138  K 5.8*  CL 98  CO2 25  GLUCOSE 179*  BUN 15  CREATININE 1.46*  CALCIUM 9.6    Liver Function Tests:  Recent Labs Lab 07/17/13 0923  AST 152*  ALT 31  ALKPHOS 87  BILITOT 0.6  PROT 8.8*  ALBUMIN 4.2   No results found for this basename: LIPASE, AMYLASE,  in the last 168 hours No results found for this basename: AMMONIA,  in the last 168 hours  CBC:  Recent Labs Lab 07/17/13 0923  WBC 5.7  NEUTROABS 3.8  HGB 17.3*  HCT 49.5  MCV 87.9  PLT 269    Cardiac Enzymes:  Recent Labs Lab 07/17/13 0923  TROPONINI 12.76*    BNP: BNP (last 3  results)  Recent Labs  09/14/12 1123 07/17/13 0923  PROBNP 485.0* 2538.0*    CBG: No results found for this basename: GLUCAP,  in the last 168 hours  Coagulation Studies:  Recent Labs  07/17/13 0923  LABPROT 21.3*  INR 1.91*    Other results: EKG: SR Inferior and anterolateral Qs (old). No acute ST-T wave abnormalities.    Imaging: Dg Chest Portable 1 View  07/17/2013   CLINICAL DATA:  Left-sided chest pain for 3 days, weakness and dizziness.  EXAM: PORTABLE CHEST - 1 VIEW  COMPARISON:  08/03/2012  FINDINGS: Evidence of median sternotomy noted with LVAD in place. Left-sided defibrillator noted. Mild prominence of the cardiac silhouette persists. Tricuspid valvuloplasty reidentified. Trace left pleural fluid or thickening persists. No new pulmonary opacity.  IMPRESSION: No new acute abnormality. Stable appearance of trace left pleural fluid or thickening.   Electronically Signed   By: Christiana Pellant M.D.   On: 07/17/2013 10:12       Assessment:   1. NSTEMI - ? Embolic in nature 2. Chronic systolic HF s/p HM II VAD 3. NICM EF 15% 4. Hyperkalemia 5. HTN - MAP 80 currently  Plan/Discussion:    With previously normal coronaries on cath and low INR, concern is for embolic phenomenon from pump to coronary (vs plaque rupture). D/w with [facility] LVAD team. Will admit here for cath on Monday. Cycle CE. Continue ASA, Plavix. Hold coumadin. Add heparin. LDH pending.   HF looks well compensated though BNP is elevated over baseline. Got one dose IV lasix in ER. Will follow. Given hyperkalemia will hold losartan and KCl for now.    Length of Stay: 0 Daniel Bensimhon 07/17/2013, 11:13 AM  Advanced Heart Failure Team Pager [phone] (  M-F; 7a - 4p)  Please contact [facility] for night-coverage after hours (4p -7a ) and weekends on [email or web address]

## 2013-07-17 NOTE — ED Provider Notes (Signed)
CSN: [id number]     Arrival date & time 07/17/13  4098 History   First MD Initiated Contact with Patient 07/17/13 [phone]     Chief Complaint  Patient presents with  . Chest Pain   (Consider location/radiation/quality/duration/timing/severity/associated sxs/prior Treatment) HPI Comments: Patient presents with substernal chest pain has been intermittent for the past 3 days. It lasts about an hour at a time and does not radiate. It is associated with diaphoresis and shortness of breath. Patient has a history of nonischemic cardiomyopathy with LVAD in place since 2012. He denies any dizziness or lightheadedness. On arrival to the ED for chest pain has improved. He did not receive any medications from EMS. He denies any history of ischemic coronary disease. States compliance with her medications. His cardiologist is Dr. Gala Romney.  The history is provided by the patient and the EMS personnel.    Past Medical History  Diagnosis Date  . CHF (congestive heart failure)     EF- 10-15  . Medically noncompliant   . Mitral regurgitation   . Tobacco user   . HTN (hypertension)   . AICD (automatic cardioverter/defibrillator) present   . GERD (gastroesophageal reflux disease)   . Substance abuse   . Chronic renal insufficiency   . Syncope   . Thrombus 08/06/2010  . SYSTOLIC HEART FAILURE, CHRONIC 09/22/2008    Qualifier: Diagnosis of  By: Gala Romney, MD, Trixie Dredge   . LV (left ventricular) mural thrombus 01/28/2011  . ICD - IN SITU 09/16/2008    Qualifier: Diagnosis of  By: Wonda Amis    . MITRAL STENOSIS/ INSUFFICIENCY, NON-RHEUMATIC 09/22/2008    Qualifier: Diagnosis of  By: Gala Romney, MD, Trixie Dredge Hepatomegaly 09/16/2008    Qualifier: Diagnosis of  By: Wonda Amis    . High cholesterol 02/26/2012    "at one time"  . Sleep apnea   . Exertional dyspnea 02/26/2012  . History of blood transfusion 08/2011    "when I had heart pump"  . Migraines   . COMMON MIGRAINE  06/14/2009    Qualifier: Diagnosis of  By: Jonny Ruiz MD, Len Blalock   . History of gout 02/26/2012  . Depression   . Bipolar affective disorder 10/22/2011    pt denies this hx 02/26/2012   Past Surgical History  Procedure Laterality Date  . Cardiac defibrillator placement  ~ 2008  . Left ventricular assist device  08/2011   Family History  Problem Relation Age of Onset  . Coronary artery disease Neg Hx    History  Substance Use Topics  . Smoking status: Former Smoker -- 0.25 packs/day for 33 years    Types: Cigarettes    Quit date: 06/29/2011  . Smokeless tobacco: Former Neurosurgeon    Quit date: 06/29/2011  . Alcohol Use: No    Review of Systems  Constitutional: Negative for fever and activity change.  Eyes: Negative for visual disturbance.  Respiratory: Positive for chest tightness and shortness of breath. Negative for cough.   Cardiovascular: Positive for chest pain.  Gastrointestinal: Negative for nausea, vomiting and abdominal pain.  Genitourinary: Negative for dysuria and hematuria.  Musculoskeletal: Negative for arthralgias, back pain and myalgias.  Skin: Negative for rash.  Neurological: Negative for dizziness and weakness.  A complete 10 system review of systems was obtained and all systems are negative except as noted in the HPI and PMH.    Allergies  Ace inhibitors and Lexapro  Home Medications   Current Outpatient Rx  Name  Route  Sig  Dispense  Refill  . amLODipine (NORVASC) 10 MG tablet   Oral   Take 10 mg by mouth daily.         Marland Kitchen aspirin EC 81 MG tablet   Oral   Take 81 mg by mouth daily.         . carvedilol (COREG) 25 MG tablet   Oral   Take 50 mg by mouth 2 (two) times daily with a meal.         . clopidogrel (PLAVIX) 75 MG tablet   Oral   Take 1 tablet (75 mg total) by mouth daily with breakfast.   30 tablet   4   . gabapentin (NEURONTIN) 300 MG capsule   Oral   Take 300 mg by mouth 3 (three) times daily.         . hydrALAZINE (APRESOLINE)  100 MG tablet   Oral   Take 100 mg by mouth 3 (three) times daily.         Marland Kitchen losartan (COZAAR) 100 MG tablet   Oral   Take 100 mg by mouth daily.         . magnesium oxide (MAG-OX) 400 MG tablet   Oral   Take 400 mg by mouth daily.         . potassium chloride (K-DUR,KLOR-CON) 10 MEQ tablet   Oral   Take 20 mEq by mouth daily.         . ranitidine (ZANTAC) 150 MG tablet   Oral   Take 150 mg by mouth 2 (two) times daily.          Marland Kitchen torsemide (DEMADEX) 20 MG tablet   Oral   Take 2 tablets (40 mg total) by mouth daily.   30 tablet   3   . warfarin (COUMADIN) 5 MG tablet   Oral   Take 5 mg by mouth daily.          Marland Kitchen allopurinol (ZYLOPRIM) 100 MG tablet   Oral   Take 100 mg by mouth as needed. For gout          BP   Pulse 102  Temp(Src) 97.7 F (36.5 C) (Oral)  Resp 18  Ht 5\' 4"  (1.626 m)  Wt 183 lb (83.008 kg)  BMI 31.40 kg/m2  SpO2 100% Physical Exam  Constitutional: He is oriented to person, place, and time. He appears well-developed and well-nourished. No distress.  HENT:  Head: Normocephalic and atraumatic.  Mouth/Throat: Oropharynx is clear and moist. No oropharyngeal exudate.  Eyes: Conjunctivae and EOM are normal. Pupils are equal, round, and reactive to light.  Neck: Normal range of motion. Neck supple.  Cardiovascular:  Hum of LVAD  Pulmonary/Chest: Effort normal and breath sounds normal. No respiratory distress.  Abdominal: Soft. There is no tenderness. There is no rebound and no guarding.  LVAD pump in place.  Musculoskeletal: Normal range of motion. He exhibits no edema and no tenderness.  Neurological: He is alert and oriented to person, place, and time. No cranial nerve deficit. He exhibits normal muscle tone. Coordination normal.  Skin: Skin is warm.    ED Course  Procedures (including critical care time) Labs Review Labs Reviewed  CBC WITH DIFFERENTIAL - Abnormal; Notable for the following:    Hemoglobin 17.3 (*)    RDW 15.7  (*)    All other components within normal limits  COMPREHENSIVE METABOLIC PANEL - Abnormal; Notable for the following:    Potassium  5.8 (*)    Glucose, Bld 179 (*)    Creatinine, Ser 1.46 (*)    Total Protein 8.8 (*)    AST 152 (*)    GFR calc non Af Amer 54 (*)    GFR calc Af Amer 63 (*)    All other components within normal limits  PROTIME-INR - Abnormal; Notable for the following:    Prothrombin Time 21.3 (*)    INR 1.91 (*)    All other components within normal limits  TROPONIN I - Abnormal; Notable for the following:    Troponin I 12.76 (*)    All other components within normal limits  PRO B NATRIURETIC PEPTIDE - Abnormal; Notable for the following:    Pro B Natriuretic peptide (BNP) 2538.0 (*)    All other components within normal limits  LACTATE DEHYDROGENASE - Abnormal; Notable for the following:    LDH 976 (*)    All other components within normal limits  HEPARIN LEVEL (UNFRACTIONATED)  TROPONIN I  TROPONIN I   Imaging Review Dg Chest Portable 1 View  07/17/2013   CLINICAL DATA:  Left-sided chest pain for 3 days, weakness and dizziness.  EXAM: PORTABLE CHEST - 1 VIEW  COMPARISON:  08/03/2012  FINDINGS: Evidence of median sternotomy noted with LVAD in place. Left-sided defibrillator noted. Mild prominence of the cardiac silhouette persists. Tricuspid valvuloplasty reidentified. Trace left pleural fluid or thickening persists. No new pulmonary opacity.  IMPRESSION: No new acute abnormality. Stable appearance of trace left pleural fluid or thickening.   Electronically Signed   By: Christiana Pellant M.D.   On: 07/17/2013 10:12    EKG Interpretation    Date/Time:  Saturday July 17 2013 09:14:35 EST Ventricular Rate:  94 PR Interval:  169 QRS Duration: 101 QT Interval:  366 QTC Calculation: 458 R Axis:   -100 Text Interpretation:  Sinus rhythm Probable left atrial enlargement Inferior infarct, old Abnormal lateral Q waves Anterior infarct, old subtle ST elevation  inferior leads Confirmed by Tuff Clabo  MD, Zuha Dejonge ([id number]) on 07/17/2013 9:46:21 AM            MDM   1. NSTEMI (non-ST elevated myocardial infarction)   2. LVAD (left ventricular assist device) present   3. Chronic systolic heart failure    Substernal chest pain intermittent for the past 3 days no radiation. Onset during sex this morning.   Subtle ST elevation inferior leads on EKG. This was discussed with Dr. Gala Romney states patient has a catheterization in the past that showed no coronary disease. We'll not call STEMI at this time.  Troponin is 12. Patient has no chest pain in the ED. Dr. Gala Romney his reviewed EKG and spoken with patient. He feels this does not represent STEMI. Patient had previously normal coronaries on cath.  Possible emboli from pump to coronaries. INR 1.9.  Heparin gtt started.  K 5.8. No hyperkalemic changes in EKG.  Plan to admit to CCU here.  D/w [facility] LVAD team per cardiology.     CRITICAL CARE Performed by: Glynn Octave Total critical care time: 30 Critical care time was exclusive of separately billable procedures and treating other patients. Critical care was necessary to treat or prevent imminent or life-threatening deterioration. Critical care was time spent personally by me on the following activities: development of treatment plan with patient and/or surrogate as well as nursing, discussions with consultants, evaluation of patient's response to treatment, examination of patient, obtaining history from patient or surrogate, ordering and performing treatments and  interventions, ordering and review of laboratory studies, ordering and review of radiographic studies, pulse oximetry and re-evaluation of patient's condition.   Glynn OctaveStephen Saxton Chain, MD 07/17/13 [phone]

## 2013-07-17 NOTE — ED Notes (Addendum)
Dr. Venetia Night at bedside to consult. Pt to be admitted and moved to 2300. Pt remains alert and oriented x4. Remains on cardiac monitor. Vital signs stable. Denies chest pain at present.

## 2013-07-17 NOTE — Progress Notes (Signed)
ANTICOAGULATION CONSULT NOTE - Follow Up Consult  Pharmacy Consult for heparin Indication: chest pain/ACS and hx LVAD  Allergies  Allergen Reactions  . Ace Inhibitors Cough  . Lexapro [Escitalopram Oxalate] Other (See Comments)    somnolence    Patient Measurements: Height: 5\' 4"  (162.6 cm) Weight: 175 lb (79.379 kg) IBW/kg (Calculated) : 59.2   Vital Signs: Temp: 98.2 F (36.8 C) (01/24 2000) Temp src: Oral (01/24 2000) BP: Eric/0 mmHg (01/24 1740) Pulse Rate: 115 (01/24 2000)  Labs:  Recent Labs  07/17/13 0923 07/17/13 1525 07/17/13 2017  HGB 17.3*  --   --   HCT 49.5  --   --   PLT 269  --   --   LABPROT 21.3*  --   --   INR 1.91*  --   --   HEPARINUNFRC  --   --  0.10*  CREATININE 1.46*  --   --   TROPONINI 12.76* 17.02*  --     Estimated Creatinine Clearance: 57.6 ml/min (by C-G formula based on Cr of 1.46).   Medical History: Past Medical History  Diagnosis Date  . CHF (congestive heart failure)     EF- 10-15  . Medically noncompliant   . Mitral regurgitation   . Tobacco user   . HTN (hypertension)   . AICD (automatic cardioverter/defibrillator) present   . GERD (gastroesophageal reflux disease)   . Substance abuse   . Chronic renal insufficiency   . Syncope   . Thrombus 08/06/2010  . SYSTOLIC HEART FAILURE, CHRONIC 09/22/2008    Qualifier: Diagnosis of  By: Gala Romney, MD, Trixie Dredge   . LV (left ventricular) mural thrombus 01/28/2011  . ICD - IN SITU 09/16/2008    Qualifier: Diagnosis of  By: Wonda Amis    . MITRAL STENOSIS/ INSUFFICIENCY, NON-RHEUMATIC 09/22/2008    Qualifier: Diagnosis of  By: Gala Romney, MD, Trixie Dredge Hepatomegaly 09/16/2008    Qualifier: Diagnosis of  By: Wonda Amis    . High cholesterol 02/26/2012    "at one time"  . Sleep apnea   . Exertional dyspnea 02/26/2012  . History of blood transfusion 08/2011    "when I had heart pump"  . Migraines   . COMMON MIGRAINE 06/14/2009    Qualifier:  Diagnosis of  By: Jonny Ruiz MD, Len Blalock   . History of gout 02/26/2012  . Depression   . Bipolar affective disorder 10/22/2011    pt denies this hx 02/26/2012   Medications:  Infusions:  . heparin 900 Units/hr (07/17/13 2000)  . nitroGLYCERIN Stopped (07/17/13 1800)  . norepinephrine (LEVOPHED) Adult infusion 10 mcg/min (07/17/13 2000)   Assessment: [name] on chronic anticoagulation for hx of LVAD presented to the ED with CP. Troponin is significantly elevated at 12.76> 17, LDH 976 up from 441 2 mo ago, resent INR 1.2 > 1.9 on admit . IV heparin for anticoagulation but held 1700-1900 for central line placement. Heparin restarted 1900 HL drawn 1hr later 0.1 - not accurate level - will recheck 6hr after restart.  CBC is ok.   Goal of Therapy:  Heparin level 0.3-0.7 units/ml Monitor platelets by anticoagulation protocol: Yes   Plan:  1. Heparin gtt 900 units/hr 2. Check an 6 hour heparin level 3. Daily heparin level and CBC 4. F/u anticoagulation plans  Leota Sauers Pharm.D. CPP, BCPS Clinical Pharmacist [phone] 07/17/2013 9:00 PM

## 2013-07-17 NOTE — Progress Notes (Signed)
ANTICOAGULATION CONSULT NOTE - Initial Consult  Pharmacy Consult for heparin Indication: chest pain/ACS and hx LVAD  Allergies  Allergen Reactions  . Ace Inhibitors Cough  . Lexapro [Escitalopram Oxalate] Other (See Comments)    somnolence    Patient Measurements: Height: 5\' 4"  (162.6 cm) Weight: 183 lb (83.008 kg) IBW/kg (Calculated) : 59.2 Heparin Dosing Weight:   Vital Signs: Temp: 97.7 F (36.5 C) (01/24 0919) Temp src: Oral (01/24 0919)  Labs:  Recent Labs  07/17/13 0923  HGB 17.3*  HCT 49.5  PLT 269  LABPROT 21.3*  INR 1.91*  CREATININE 1.46*  TROPONINI 12.76*    Estimated Creatinine Clearance: 58.8 ml/min (by C-G formula based on Cr of 1.46).   Medical History: Past Medical History  Diagnosis Date  . CHF (congestive heart failure)     EF- 10-15  . Medically noncompliant   . Mitral regurgitation   . Tobacco user   . HTN (hypertension)   . AICD (automatic cardioverter/defibrillator) present   . GERD (gastroesophageal reflux disease)   . Substance abuse   . Chronic renal insufficiency   . Syncope   . Thrombus 08/06/2010  . SYSTOLIC HEART FAILURE, CHRONIC 09/22/2008    Qualifier: Diagnosis of  By: Gala Romney, MD, Trixie Dredge   . LV (left ventricular) mural thrombus 01/28/2011  . ICD - IN SITU 09/16/2008    Qualifier: Diagnosis of  By: Wonda Amis    . MITRAL STENOSIS/ INSUFFICIENCY, NON-RHEUMATIC 09/22/2008    Qualifier: Diagnosis of  By: Gala Romney, MD, Trixie Dredge Hepatomegaly 09/16/2008    Qualifier: Diagnosis of  By: Wonda Amis    . High cholesterol 02/26/2012    "at one time"  . Sleep apnea   . Exertional dyspnea 02/26/2012  . History of blood transfusion 08/2011    "when I had heart pump"  . Migraines   . COMMON MIGRAINE 06/14/2009    Qualifier: Diagnosis of  By: Jonny Ruiz MD, Len Blalock   . History of gout 02/26/2012  . Depression   . Bipolar affective disorder 10/22/2011    pt denies this hx 02/26/2012   Medications:   Infusions:  . heparin     Assessment: 52 yom on chronic anticoagulation for hx of LVAD presented to the ED with CP. Troponin is significantly elevated at 12.76. To start IV heparin for anticoagulation. INR is slightly subtherapeutic at 1.91. CBC is ok. No bleeding noted.   Goal of Therapy:  Heparin level 0.3-0.7 units/ml Monitor platelets by anticoagulation protocol: Yes   Plan:  1. Heparin gtt 900 units/hr (previously therapeutic on this rate) - no bolus since INR is elevated 2. Check an 8 hour heparin level 3. Daily heparin level and CBC 4. F/u anticoagulation plans  Mira Balon, [name] Leach 07/17/2013,10:43 AM

## 2013-07-17 NOTE — ED Notes (Signed)
Lab called with critical troponin of 12.76, EDP notified.

## 2013-07-18 ENCOUNTER — Inpatient Hospital Stay (HOSPITAL_COMMUNITY): Payer: Medicare Other

## 2013-07-18 DIAGNOSIS — I059 Rheumatic mitral valve disease, unspecified: Secondary | ICD-10-CM

## 2013-07-18 DIAGNOSIS — T82897A Other specified complication of cardiac prosthetic devices, implants and grafts, initial encounter: Secondary | ICD-10-CM | POA: Diagnosis not present

## 2013-07-18 DIAGNOSIS — R079 Chest pain, unspecified: Secondary | ICD-10-CM | POA: Diagnosis not present

## 2013-07-18 LAB — CBC WITH DIFFERENTIAL/PLATELET
Basophils Absolute: 0 10*3/uL (ref 0.0–0.1)
Basophils Relative: 0 % (ref 0–1)
EOS ABS: 0 10*3/uL (ref 0.0–0.7)
EOS PCT: 0 % (ref 0–5)
HCT: 48 % (ref 39.0–52.0)
HEMOGLOBIN: 16.5 g/dL (ref 13.0–17.0)
LYMPHS ABS: 1 10*3/uL (ref 0.7–4.0)
LYMPHS PCT: 12 % (ref 12–46)
MCH: 30.4 pg (ref 26.0–34.0)
MCHC: 34.4 g/dL (ref 30.0–36.0)
MCV: 88.6 fL (ref 78.0–100.0)
MONOS PCT: 9 % (ref 3–12)
Monocytes Absolute: 0.7 10*3/uL (ref 0.1–1.0)
Neutro Abs: 6.1 10*3/uL (ref 1.7–7.7)
Neutrophils Relative %: 78 % — ABNORMAL HIGH (ref 43–77)
Platelets: 263 10*3/uL (ref 150–400)
RBC: 5.42 MIL/uL (ref 4.22–5.81)
RDW: 15.9 % — ABNORMAL HIGH (ref 11.5–15.5)
WBC: 7.7 10*3/uL (ref 4.0–10.5)

## 2013-07-18 LAB — CBC
HCT: 43.9 % (ref 39.0–52.0)
HCT: 43 % (ref 39.0–52.0)
HEMOGLOBIN: 14.4 g/dL (ref 13.0–17.0)
Hemoglobin: 15.1 g/dL (ref 13.0–17.0)
MCH: 30.7 pg (ref 26.0–34.0)
MCH: 30 pg (ref 26.0–34.0)
MCHC: 34.4 g/dL (ref 30.0–36.0)
MCHC: 33.5 g/dL (ref 30.0–36.0)
MCV: 89.2 fL (ref 78.0–100.0)
MCV: 89.6 fL (ref 78.0–100.0)
PLATELETS: 208 10*3/uL (ref 150–400)
Platelets: 231 10*3/uL (ref 150–400)
RBC: 4.92 MIL/uL (ref 4.22–5.81)
RBC: 4.8 MIL/uL (ref 4.22–5.81)
RDW: 16 % — AB (ref 11.5–15.5)
RDW: 16 % — ABNORMAL HIGH (ref 11.5–15.5)
WBC: 7 10*3/uL (ref 4.0–10.5)
WBC: 6.7 10*3/uL (ref 4.0–10.5)

## 2013-07-18 LAB — COMPREHENSIVE METABOLIC PANEL
ALK PHOS: 98 U/L (ref 39–117)
ALT: 43 U/L (ref 0–53)
AST: 223 U/L — ABNORMAL HIGH (ref 0–37)
Albumin: 3.9 g/dL (ref 3.5–5.2)
BUN: 16 mg/dL (ref 6–23)
CALCIUM: 8.7 mg/dL (ref 8.4–10.5)
CO2: 20 meq/L (ref 19–32)
Chloride: 97 mEq/L (ref 96–112)
Creatinine, Ser: 1.4 mg/dL — ABNORMAL HIGH (ref 0.50–1.35)
GFR, EST AFRICAN AMERICAN: 66 mL/min — AB (ref >90)
GFR, EST NON AFRICAN AMERICAN: 57 mL/min — AB (ref >90)
GLUCOSE: 184 mg/dL — AB (ref 70–99)
Potassium: 4.8 mEq/L (ref 3.7–5.3)
Sodium: 135 mEq/L — ABNORMAL LOW (ref 137–147)
TOTAL PROTEIN: 8.4 g/dL — AB (ref 6.0–8.3)
Total Bilirubin: 0.4 mg/dL (ref 0.3–1.2)

## 2013-07-18 LAB — HEPARIN LEVEL (UNFRACTIONATED)
HEPARIN UNFRACTIONATED: 0.43 [IU]/mL (ref 0.30–0.70)
Heparin Unfractionated: 0.36 IU/mL (ref 0.30–0.70)
Heparin Unfractionated: 0.39 IU/mL (ref 0.30–0.70)
Heparin Unfractionated: >2.2 IU/mL — ABNORMAL HIGH (ref 0.30–0.70)

## 2013-07-18 LAB — CARBOXYHEMOGLOBIN
Carboxyhemoglobin: 1.3 % (ref 0.5–1.5)
METHEMOGLOBIN: 1.5 % (ref 0.0–1.5)
O2 Saturation: 55.3 %
Total hemoglobin: 12.4 g/dL — ABNORMAL LOW (ref 13.5–18.0)

## 2013-07-18 LAB — PROTIME-INR
INR: 3.58 — ABNORMAL HIGH (ref 0.00–1.49)
INR: 4.97 — AB (ref 0.00–1.49)
Prothrombin Time: 34.4 seconds — ABNORMAL HIGH (ref 11.6–15.2)
Prothrombin Time: 44.9 seconds — ABNORMAL HIGH (ref 11.6–15.2)

## 2013-07-18 LAB — MAGNESIUM: Magnesium: 1.9 mg/dL (ref 1.5–2.5)

## 2013-07-18 LAB — LACTATE DEHYDROGENASE: LDH: 843 U/L — ABNORMAL HIGH (ref 94–250)

## 2013-07-18 LAB — HAPTOGLOBIN: Haptoglobin: <25 mg/dL — ABNORMAL LOW (ref 45–215)

## 2013-07-18 MED ORDER — ASPIRIN 81 MG PO CHEW: 81.0000 mg | CHEWABLE_TABLET | ORAL | Status: AC

## 2013-07-18 MED ORDER — VITAMIN K1 10 MG/ML IJ SOLN
5.0000 mg | Freq: Once | INTRAVENOUS | Status: AC
  Administered 2013-07-18: 5 mg via INTRAVENOUS
  Filled 2013-07-18: qty 0.5

## 2013-07-18 MED ORDER — SODIUM CHLORIDE 0.9 % IV SOLN
250.0000 mL | INTRAVENOUS | Status: DC | PRN
  Administered 2013-07-19: 11:00:00 via INTRAVENOUS

## 2013-07-18 MED ORDER — SODIUM CHLORIDE 0.9 % IV BOLUS (SEPSIS): 250.0000 mL | Freq: Once | INTRAVENOUS | Status: DC

## 2013-07-18 MED ORDER — MORPHINE SULFATE 2 MG/ML IJ SOLN
INTRAMUSCULAR | Status: AC
  Filled 2013-07-18: qty 1

## 2013-07-18 MED ORDER — ASPIRIN 81 MG PO CHEW
81.0000 mg | CHEWABLE_TABLET | Freq: Every day | ORAL | Status: DC
  Administered 2013-07-18: 81 mg via ORAL
  Filled 2013-07-18: qty 1

## 2013-07-18 MED ORDER — MAGNESIUM SULFATE 40 MG/ML IJ SOLN
2.0000 g | Freq: Once | INTRAMUSCULAR | Status: AC
  Administered 2013-07-18: 2 g via INTRAVENOUS
  Filled 2013-07-18: qty 50

## 2013-07-18 MED ORDER — SODIUM CHLORIDE 0.9 % IJ SOLN
10.0000 mL | Freq: Two times a day (BID) | INTRAMUSCULAR | Status: DC
Start: 2013-07-18 — End: 2013-08-11
  Administered 2013-07-18 – 2013-07-21 (×5): 10 mL
  Administered 2013-07-22: 40 mL
  Administered 2013-07-23: 30 mL
  Administered 2013-07-23: 20 mL
  Administered 2013-07-24 – 2013-07-28 (×8): 10 mL
  Administered 2013-07-28: 40 mL
  Administered 2013-07-29 – 2013-07-30 (×3): 10 mL
  Administered 2013-07-31: 20 mL
  Administered 2013-07-31 – 2013-08-04 (×8): 10 mL
  Administered 2013-08-04: 40 mL
  Administered 2013-08-05 – 2013-08-09 (×10): 10 mL
  Administered 2013-08-10: 20 mL
  Administered 2013-08-10: 10 mL
  Administered 2013-08-11: 20 mL

## 2013-07-18 MED ORDER — SODIUM CHLORIDE 0.9 % IJ SOLN: 3.0000 mL | INTRAMUSCULAR | Status: DC | PRN

## 2013-07-18 MED ORDER — MORPHINE SULFATE 2 MG/ML IJ SOLN
2.0000 mg | Freq: Once | INTRAMUSCULAR | Status: AC
  Administered 2013-07-18: 2 mg via INTRAVENOUS

## 2013-07-18 MED ORDER — PHYTONADIONE 1 MG/0.5 ML ORAL SOLUTION
2.0000 mg | Freq: Once | ORAL | Status: AC
  Administered 2013-07-18: 2 mg via ORAL
  Filled 2013-07-18: qty 1

## 2013-07-18 MED ORDER — SODIUM CHLORIDE 0.9 % IJ SOLN: 3.0000 mL | Freq: Two times a day (BID) | INTRAMUSCULAR | Status: DC

## 2013-07-18 MED ORDER — MORPHINE SULFATE 2 MG/ML IJ SOLN: 1.0000 mg | INTRAMUSCULAR | Status: DC | PRN

## 2013-07-18 MED ORDER — SODIUM CHLORIDE 0.9 % IV BOLUS (SEPSIS)
250.0000 mL | Freq: Once | INTRAVENOUS | Status: AC
  Administered 2013-07-18: 250 mL via INTRAVENOUS

## 2013-07-18 MED ORDER — SODIUM CHLORIDE 0.9 % IJ SOLN
10.0000 mL | INTRAMUSCULAR | Status: DC | PRN
Start: 2013-07-18 — End: 2013-07-20

## 2013-07-18 MED ORDER — ASPIRIN 325 MG PO TABS: 325.0000 mg | ORAL_TABLET | Freq: Every day | ORAL | Status: DC

## 2013-07-18 NOTE — Progress Notes (Addendum)
ANTICOAGULATION CONSULT NOTE - Follow Up Consult  Pharmacy Consult for heparin Indication: chest pain/ACS and hx LVAD  Allergies  Allergen Reactions  . Ace Inhibitors Cough  . Lexapro [Escitalopram Oxalate] Other (See Comments)    somnolence    Patient Measurements: Height: 5\' 4"  (162.6 cm) Weight: 175 lb (79.379 kg) IBW/kg (Calculated) : 59.2   Vital Signs: Temp: 97.9 F (36.6 C) (01/25 0000) Temp src: Oral (01/25 0000) BP: 50/0 mmHg (01/24 1740) Pulse Rate: 101 (01/25 0200)  Labs:  Recent Labs  07/17/13 0923 07/17/13 1525 07/17/13 2017 07/18/13 0149  HGB 17.3*  --   --  16.5  HCT 49.5  --   --  48.0  PLT 269  --   --  263  LABPROT 21.3*  --   --  34.4*  INR 1.91*  --   --  3.58*  HEPARINUNFRC  --   --  0.10* 0.36  0.39  CREATININE 1.46*  --   --   --   TROPONINI 12.76* 17.02* 19.35*  --     Estimated Creatinine Clearance: 57.6 ml/min (by C-G formula based on Cr of 1.46).  Assessment: 53 yo male with CHF/LVAD for anticoagulation  Goal of Therapy:  Heparin level 0.3-0.7 units/ml Monitor platelets by anticoagulation protocol: Yes   Plan:  No change to heparin for now. F/U with MD in AM to verify heparin status given large increase in INR this morning.  Geannie Risen, PharmD, BCPS  07/18/2013 2:42 AM

## 2013-07-18 NOTE — Progress Notes (Addendum)
Catch-up x-cover note:  Patient developed 10/10 CP last night. Given 2 SL NTG and 4mg  morphine. Started NTG gtt. Became profoundly hypotensive (MAP < 40) and diaphoretic. ECG without change.   LDH > 900 with Trop 17 concerning for probable pump thrombosis with resultant coronary embolus.   Treated with 1L NS and norepinephrine started. I attempted to place RIJ central line but unsuccessful. After ~1h patient's BP recovered and CP resolved.   Discussed case with on-call interventionalist, Dr. Kirke Corin, at bedside. Decision made to treat medically.  Total critical care time at bedside > 1 hour.   Daniel Bensimhon,MD 7:47 AM

## 2013-07-18 NOTE — Progress Notes (Signed)
ANTICOAGULATION CONSULT NOTE - Follow Up Consult  Pharmacy Consult for heparin Indication: chest pain/ACS and hx LVAD  Allergies  Allergen Reactions  . Ace Inhibitors Cough  . Lexapro [Escitalopram Oxalate] Other (See Comments)    somnolence    Patient Measurements: Height: 5\' 4"  (162.6 cm) Weight: 175 lb 7.8 oz (79.6 kg) IBW/kg (Calculated) : 59.2   Vital Signs: Temp: 98 F (36.7 C) (01/25 0400) Temp src: Oral (01/25 0400) Pulse Rate: 106 (01/25 0727)  Labs:  Recent Labs  07/17/13 0923 07/17/13 1525 07/17/13 2017 07/18/13 0149  HGB 17.3*  --   --  16.5  HCT 49.5  --   --  48.0  PLT 269  --   --  263  LABPROT 21.3*  --   --  34.4*  INR 1.91*  --   --  3.58*  HEPARINUNFRC  --   --  0.10* 0.36  0.39  CREATININE 1.46*  --   --  1.40*  TROPONINI 12.76* 17.02* 19.35*  --     Estimated Creatinine Clearance: 60.2 ml/min (by C-G formula based on Cr of 1.4).  Assessment: 53 yo male with LVAD admitted 07/17/2013 with elevated troponin.  Pharmacy consulted to dose heparin.  Coag: LVAD, ACS, INR elevated this am (extra warfarin doses given over last 4 days are likely catching up, + now on pressors), heparin level at goal, no bleeding noted.  Plan to take patient to the cath lab.  Risks of clot vs.bleeding. Discussed risk v. benefit with Dr. Gala Romney agreed to give 2mg  PO vitamin K.  Continue heparin given elevated troponin and upward trend.    Goal of Therapy:  Heparin level 0.3-0.7 units/ml Monitor platelets by anticoagulation protocol: Yes   Plan:  No change to heparin for now. Vitamin K 2mg  PO Follow up INR, CBC and heparin level in the am, unless otherwise clinically indicated  Thank you for allowing pharmacy to be a part of this patients care team.  Lovenia Kim Pharm.D., BCPS Clinical Pharmacist 07/18/2013 7:39 AM Pager: [phone] Phone: [phone]

## 2013-07-18 NOTE — Progress Notes (Signed)
Echo Lab  2D Echocardiogram completed.  Zeenat Jeanbaptiste L Amauria Younts, RDCS 07/18/2013 2:31 PM

## 2013-07-18 NOTE — Progress Notes (Signed)
Peripherally Inserted Central Catheter/Midline Placement  The IV Nurse has discussed with the patient and/or persons authorized to consent for the patient, the purpose of this procedure and the potential benefits and risks involved with this procedure.  The benefits include less needle sticks, lab draws from the catheter and patient may be discharged home with the catheter.  Risks include, but not limited to, infection, bleeding, blood clot (thrombus formation), and puncture of an artery; nerve damage and irregular heat beat.  Alternatives to this procedure were also discussed.  PICC/Midline Placement Documentation        [name] 07/18/2013, 11:51 AM

## 2013-07-18 NOTE — Progress Notes (Signed)
ANTICOAGULATION CONSULT NOTE - Follow Up Consult  Pharmacy Consult for heparin Indication: chest pain/ACS and hx LVAD  Allergies  Allergen Reactions  . Ace Inhibitors Cough  . Lexapro [Escitalopram Oxalate] Other (See Comments)    somnolence    Patient Measurements: Height: 5\' 4"  (162.6 cm) Weight: 175 lb 7.8 oz (79.6 kg) IBW/kg (Calculated) : 59.2   Vital Signs: Temp: 98.3 F (36.8 C) (01/25 1955) Temp src: Oral (01/25 1955) Pulse Rate: 113 (01/25 2100)  Labs:  Recent Labs  07/17/13 0923 07/17/13 1525  07/17/13 2017 07/18/13 0149 07/18/13 1545 07/18/13 1930 07/18/13 2100  HGB 17.3*  --   --   --  16.5 15.1 14.4  --   HCT 49.5  --   --   --  48.0 43.9 43.0  --   PLT 269  --   --   --  263 208 231  --   LABPROT 21.3*  --   --   --  34.4*  --  44.9*  --   INR 1.91*  --   --   --  3.58*  --  4.97*  --   HEPARINUNFRC  --   --   < > 0.10* 0.36  0.39  --  >2.20* 0.43  CREATININE 1.46*  --   --   --  1.40*  --   --   --   TROPONINI 12.76* 17.02*  --  19.35*  --   --   --   --   < > = values in this interval not displayed.  Estimated Creatinine Clearance: 60.2 ml/min (by C-G formula based on Cr of 1.4).  Assessment: 53 yo male with LVAD admitted 07/17/2013 with elevated troponin.  Pharmacy consulted to dose heparin.  Coag: LVAD, ACS, INR elevated this am  (extra warfarin doses given over last 4 days are likely catching up, + now on pressors s/p drop in MAP last night).  Vit K 2mg  po given today with recheck INR increased 3.58>4.97.  Will again give Vit K  - discussed with Dr. Gala Romney, as plan for cath lab in am.  Heparin drip  900 uts/hr heparin level 2.2 - drawn from PICC line with heparin running - recheck from peripheral stick  0.43 at goal. CBC stable,  no bleeding noted.  Goal of Therapy:  Heparin level 0.3-0.7 units/ml Monitor platelets by anticoagulation protocol: Yes INR 2-3  Plan:  Continue Heparin drip 900 uts/hr Vitamin K 5mg  IV x1 Follow up INR, CBC  and heparin level in the am  Leota Sauers Pharm.D. CPP, BCPS Clinical Pharmacist [phone] 07/18/2013 10:01 PM

## 2013-07-18 NOTE — Progress Notes (Addendum)
Patient ID: [name], male   DOB: 13-Dec-1960, 53 y.o.   MRN: [id number] Advanced Heart Failure Rounding Note  [name] is 53 y/o [place] American male with a history of severe CHF due to NICM EF 10-15%, and severe MR s/p HM II LVAD placed at [facility] March 2013. His medical history also consists of CRI, NSVT s/p ICD and LV thrombus.   Has been doing very well. Being followed at [facility]. Pending work-up for transplant listing currently. On Thursday and Friday had one episode of central CP each day at rest associates with mild diaphoresis. Resolved spontaneously. This am severe episode of CP during sex. With severe diaphoresis and radiation to R arm. Noted pump flow transiently < 2 but back up quickly. Came to ER CP resolved. Received ASA. ECG unchanged from previous. Troponin 12 => 19.   Says INR has been low (checks at home). Thurs INR 1.2 took 10mg  warfarin in (typical dose is 5mg )  Fri INR 1.5 took 6mg  warfarin. INR Saturday 1.9. No TIA or neuro symptoms. LDH high around 900. No bleeding   Denies LVAD alarms. Denies driveline trauma, erythema or drainage. Denies ICD shocks.  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   Chest pain last night: received NTG with low flow and hypotension, norepinephrine started.   BP stabilized.  More chest pain this morning resolved with morphine.  Currently chest pain free.  One low flow event since 6 pm last night (when he got NTG) which does appear better than yesterday.  MAP 70s, HR 100s ST.   INR up to 3.6 today, to receive 2 mg vitamin K.   He is on ASA, Plavix, and heparin gtt.   VAD interrogated personally.  Flow 3.0 Speed 9400 PI 3.8 Power 4.7.  1 low flow event early this morning.    Filed Vitals:   07/18/13 0700 07/18/13 0727 07/18/13 0800 07/18/13 0809  BP:      Pulse: 112 106 105   Temp:    97.2 F (36.2 C)  TempSrc:    Oral  Resp: 47 17 11   Height:      Weight:       SpO2: 100% 97% 100%     Intake/Output Summary (Last 24 hours) at 07/18/13 0849 Last data filed at 07/18/13 0800  Gross per 24 hour  Intake 1625.75 ml  Output   1000 ml  Net 625.75 ml    LABS: Basic Metabolic Panel:  Recent Labs  84/53/64 0923 07/18/13 0149  NA 138 135*  K 5.8* 4.8  CL 98 97  CO2 25 20  GLUCOSE 179* 184*  BUN 15 16  CREATININE 1.46* 1.40*  CALCIUM 9.6 8.7  MG  --  1.9   Liver Function Tests:  Recent Labs  07/17/13 0923 07/18/13 0149  AST 152* 223*  ALT 31 43  ALKPHOS 87 98  BILITOT 0.6 0.4  PROT 8.8* 8.4*  ALBUMIN 4.2 3.9   No results found for this basename: LIPASE, AMYLASE,  in the last 72 hours CBC:  Recent Labs  07/17/13 0923 07/18/13 0149  WBC 5.7 7.7  NEUTROABS 3.8 6.1  HGB 17.3* 16.5  HCT 49.5 48.0  MCV 87.9 88.6  PLT 269 263   Cardiac Enzymes:  Recent Labs  07/17/13 0923 07/17/13 1525 07/17/13 2017  TROPONINI 12.76* 17.02* 19.35*   INR 3.58 LDH 843  RADIOLOGY: Dg Chest Portable 1 View  07/17/2013  CLINICAL DATA:  Left-sided chest pain for 3 days, weakness and dizziness.  EXAM: PORTABLE CHEST - 1 VIEW  COMPARISON:  08/03/2012  FINDINGS: Evidence of median sternotomy noted with LVAD in place. Left-sided defibrillator noted. Mild prominence of the cardiac silhouette persists. Tricuspid valvuloplasty reidentified. Trace left pleural fluid or thickening persists. No new pulmonary opacity.  IMPRESSION: No new acute abnormality. Stable appearance of trace left pleural fluid or thickening.   Electronically Signed   By: Christiana PellantGretchen  Green M.D.   On: 07/17/2013 10:12    PHYSICAL EXAM General: NAD Neck: No JVD, no thyromegaly or thyroid nodule.  Lungs: Clear to auscultation bilaterally with normal respiratory effort. CV: LVAD pump hum normal.  No peripheral edema.   Abdomen: Soft, nontender, no hepatosplenomegaly, no distention.  Neurologic: Alert and oriented x 3.  Psych: Normal affect. Extremities: No clubbing or cyanosis.    TELEMETRY: Reviewed telemetry pt in sinus tachy in 100s   Assessment:   1. NSTEMI - ? Embolic in nature.  Concern for pump thrombosis with coronary embolus.  2. Chronic systolic HF s/p HM II VAD  3. NICM EF 15%  4. Hyperkalemia  5. HTN - MAP 70s currently   Plan/Discussion:   With previously normal coronaries on cath and low INR, concern is for embolic phenomenon from pump to coronary (vs plaque rupture). D/w with [facility] LVAD team. Will admitted here for cath [facility] on Monday. Continue ASA, Plavix. Hold coumadin. Added heparin gtt.  Will get vitamin K today with INR rise to 3.6.  Continue heparin with suspicion for pump thrombosis and use of vitamin K.  No NTG with further chest pain, use morphine.   As above, concern for pump thrombosis with LDH elevated and low flow noted.  Total bilirubin in normal but haptoglobin is low.  1 low flow event last night, prior to that had low flow yesterday around 6 pm after getting NTG.  He remains on norepinephrine which will be weaned to keep MAP > 70. Hold Coreg for now. If remains stable, echo with ramp study tomorrow to assess for evidence of pump thrombosis, would consider cardiac-gated CTA to look for evidence of thrombosis.  Will also get RHC with coronary angiography/left ventriculography. Finally, we could consider use of GPIIbIIIa inhibitor if studies strongly suggest thrombosis.   HF looks reasonably compensated though BNP is elevated over baseline. Got one dose IV lasix in ER and home torsemide resumed. Will follow. Given hyperkalemia and low MAP held losartan and KCl. Creatinine stable today.   Unable to place right IJ.  Will place PICC this morning.  Hopefully will get off norepinephrine soon.    Marca AnconaDalton Rhylee Nunn 07/18/2013 8:57 AM

## 2013-07-19 ENCOUNTER — Inpatient Hospital Stay (HOSPITAL_COMMUNITY): Payer: Medicare Other

## 2013-07-19 ENCOUNTER — Encounter (HOSPITAL_COMMUNITY): Payer: Medicare Other | Admitting: Certified Registered Nurse Anesthetist

## 2013-07-19 ENCOUNTER — Inpatient Hospital Stay (HOSPITAL_COMMUNITY): Payer: Medicare Other | Admitting: Certified Registered Nurse Anesthetist

## 2013-07-19 ENCOUNTER — Encounter (HOSPITAL_COMMUNITY): Payer: Self-pay | Admitting: Anesthesiology

## 2013-07-19 ENCOUNTER — Encounter (HOSPITAL_COMMUNITY)
Admission: EM | Disposition: A | Discharge status: Rehabilitation Facility | Payer: Self-pay | Source: Home / Self Care | Attending: Internal Medicine

## 2013-07-19 DIAGNOSIS — T82897A Other specified complication of cardiac prosthetic devices, implants and grafts, initial encounter: Secondary | ICD-10-CM | POA: Diagnosis not present

## 2013-07-19 DIAGNOSIS — I639 Cerebral infarction, unspecified: Secondary | ICD-10-CM

## 2013-07-19 DIAGNOSIS — R4182 Altered mental status, unspecified: Secondary | ICD-10-CM

## 2013-07-19 DIAGNOSIS — I509 Heart failure, unspecified: Secondary | ICD-10-CM

## 2013-07-19 DIAGNOSIS — I6350 Cerebral infarction due to unspecified occlusion or stenosis of unspecified cerebral artery: Secondary | ICD-10-CM

## 2013-07-19 DIAGNOSIS — R079 Chest pain, unspecified: Secondary | ICD-10-CM | POA: Diagnosis not present

## 2013-07-19 DIAGNOSIS — J9600 Acute respiratory failure, unspecified whether with hypoxia or hypercapnia: Secondary | ICD-10-CM

## 2013-07-19 DIAGNOSIS — I5023 Acute on chronic systolic (congestive) heart failure: Secondary | ICD-10-CM

## 2013-07-19 HISTORY — PX: LEFT AND RIGHT HEART CATHETERIZATION WITH CORONARY ANGIOGRAM: SHX5449

## 2013-07-19 HISTORY — PX: RADIOLOGY WITH ANESTHESIA: SHX6223

## 2013-07-19 LAB — HEMOGLOBIN A1C
HEMOGLOBIN A1C: 5.7 % — AB (ref <5.7)
Mean Plasma Glucose: 117 mg/dL — ABNORMAL HIGH (ref <117)

## 2013-07-19 LAB — COMPREHENSIVE METABOLIC PANEL
ALBUMIN: 3.6 g/dL (ref 3.5–5.2)
ALT: 80 U/L — AB (ref 0–53)
AST: 471 U/L — AB (ref 0–37)
Alkaline Phosphatase: 85 U/L (ref 39–117)
BUN: 12 mg/dL (ref 6–23)
CO2: 26 mEq/L (ref 19–32)
Calcium: 8.8 mg/dL (ref 8.4–10.5)
Chloride: 95 mEq/L — ABNORMAL LOW (ref 96–112)
Creatinine, Ser: 1.28 mg/dL (ref 0.50–1.35)
GFR calc Af Amer: 74 mL/min — ABNORMAL LOW (ref >90)
GFR calc non Af Amer: 64 mL/min — ABNORMAL LOW (ref >90)
Glucose, Bld: 139 mg/dL — ABNORMAL HIGH (ref 70–99)
POTASSIUM: 3.8 meq/L (ref 3.7–5.3)
SODIUM: 136 meq/L — AB (ref 137–147)
TOTAL PROTEIN: 7.6 g/dL (ref 6.0–8.3)
Total Bilirubin: 1.8 mg/dL — ABNORMAL HIGH (ref 0.3–1.2)

## 2013-07-19 LAB — CBC
HEMATOCRIT: 44.6 % (ref 39.0–52.0)
Hemoglobin: 15.1 g/dL (ref 13.0–17.0)
MCH: 29.8 pg (ref 26.0–34.0)
MCHC: 33.9 g/dL (ref 30.0–36.0)
MCV: 88.1 fL (ref 78.0–100.0)
Platelets: 199 10*3/uL (ref 150–400)
RBC: 5.06 MIL/uL (ref 4.22–5.81)
RDW: 15.5 % (ref 11.5–15.5)
WBC: 7.9 10*3/uL (ref 4.0–10.5)

## 2013-07-19 LAB — TYPE AND SCREEN
ABO/RH(D): O POS
Antibody Screen: NEGATIVE

## 2013-07-19 LAB — POCT I-STAT 3, ART BLOOD GAS (G3+)
ACID-BASE EXCESS: 3 mmol/L — AB (ref 0.0–2.0)
Bicarbonate: 26.6 mEq/L — ABNORMAL HIGH (ref 20.0–24.0)
O2 SAT: 100 %
PCO2 ART: 36.1 mmHg (ref 35.0–45.0)
TCO2: 28 mmol/L (ref 0–100)
pH, Arterial: 7.476 — ABNORMAL HIGH (ref 7.350–7.450)
pO2, Arterial: 210 mmHg — ABNORMAL HIGH (ref 80.0–100.0)

## 2013-07-19 LAB — PROTIME-INR
INR: 1.92 — AB (ref 0.00–1.49)
INR: 1.47 (ref 0.00–1.49)
PROTHROMBIN TIME: 17.4 s — AB (ref 11.6–15.2)
Prothrombin Time: 21.4 seconds — ABNORMAL HIGH (ref 11.6–15.2)

## 2013-07-19 LAB — CARBOXYHEMOGLOBIN
Carboxyhemoglobin: 1.5 % (ref 0.5–1.5)
METHEMOGLOBIN: 0.8 % (ref 0.0–1.5)
O2 Saturation: 64.4 %
TOTAL HEMOGLOBIN: 15.1 g/dL (ref 13.5–18.0)

## 2013-07-19 LAB — GLUCOSE, CAPILLARY: Glucose-Capillary: 125 mg/dL — ABNORMAL HIGH (ref 70–99)

## 2013-07-19 LAB — HEPARIN LEVEL (UNFRACTIONATED)
Heparin Unfractionated: <0.1 IU/mL — ABNORMAL LOW (ref 0.30–0.70)
Heparin Unfractionated: <0.1 IU/mL — ABNORMAL LOW (ref 0.30–0.70)

## 2013-07-19 LAB — LACTATE DEHYDROGENASE: LDH: 1568 U/L — ABNORMAL HIGH (ref 94–250)

## 2013-07-19 LAB — POCT ACTIVATED CLOTTING TIME: Activated Clotting Time: 160 seconds

## 2013-07-19 SURGERY — LEFT AND RIGHT HEART CATHETERIZATION WITH CORONARY ANGIOGRAM
Anesthesia: LOCAL

## 2013-07-19 SURGERY — RADIOLOGY WITH ANESTHESIA
Anesthesia: General

## 2013-07-19 MED ORDER — PROPOFOL 10 MG/ML IV BOLUS
INTRAVENOUS | Status: DC | PRN
Start: 2013-07-19 — End: 2013-07-19
  Administered 2013-07-19: 50 mg via INTRAVENOUS

## 2013-07-19 MED ORDER — BIOTENE DRY MOUTH MT LIQD
15.0000 mL | Freq: Four times a day (QID) | OROMUCOSAL | Status: DC
  Administered 2013-07-19 – 2013-07-24 (×19): 15 mL via OROMUCOSAL

## 2013-07-19 MED ORDER — IOHEXOL 300 MG/ML  SOLN
100.0000 mL | Freq: Once | INTRAMUSCULAR | Status: AC | PRN
  Administered 2013-07-19: 50 mL via INTRA_ARTERIAL

## 2013-07-19 MED ORDER — ALTEPLASE 30 MG/30 ML FOR INTERV. RAD
1.0000 mg | INTRA_ARTERIAL | Status: AC
  Filled 2013-07-19: qty 30

## 2013-07-19 MED ORDER — PROPOFOL 10 MG/ML IV EMUL
5.0000 ug/kg/min | INTRAVENOUS | Status: DC
  Administered 2013-07-19: 31.447 ug/kg/min via INTRAVENOUS
  Administered 2013-07-19: 10 ug/kg/min via INTRAVENOUS
  Administered 2013-07-20: 41 ug/kg/min via INTRAVENOUS
  Administered 2013-07-20: 41.929 ug/kg/min via INTRAVENOUS
  Administered 2013-07-20 – 2013-07-21 (×2): 40 ug/kg/min via INTRAVENOUS
  Administered 2013-07-21: 30 ug/kg/min via INTRAVENOUS
  Administered 2013-07-21: 40 ug/kg/min via INTRAVENOUS
  Administered 2013-07-22: 20 ug/kg/min via INTRAVENOUS
  Filled 2013-07-19 (×11): qty 100

## 2013-07-19 MED ORDER — LIDOCAINE HCL (PF) 1 % IJ SOLN
INTRAMUSCULAR | Status: AC
Start: 2013-07-19 — End: 2013-07-19
  Filled 2013-07-19: qty 30

## 2013-07-19 MED ORDER — ROCURONIUM BROMIDE 100 MG/10ML IV SOLN
INTRAVENOUS | Status: DC | PRN
  Administered 2013-07-19: 25 mg via INTRAVENOUS
  Administered 2013-07-19: 50 mg via INTRAVENOUS

## 2013-07-19 MED ORDER — HEPARIN (PORCINE) IN NACL 100-0.45 UNIT/ML-% IJ SOLN
1250.0000 [IU]/h | INTRAMUSCULAR | Status: DC
  Administered 2013-07-19: 900 [IU]/h via INTRAVENOUS
  Administered 2013-07-21: 1100 [IU]/h via INTRAVENOUS
  Filled 2013-07-19 (×3): qty 250

## 2013-07-19 MED ORDER — HEPARIN (PORCINE) IN NACL 2-0.9 UNIT/ML-% IJ SOLN
INTRAMUSCULAR | Status: AC
  Filled 2013-07-19: qty 1500

## 2013-07-19 MED ORDER — SODIUM CHLORIDE 0.9 % IV SOLN: INTRAVENOUS | Status: DC

## 2013-07-19 MED ORDER — ETOMIDATE 2 MG/ML IV SOLN
INTRAVENOUS | Status: DC | PRN
  Administered 2013-07-19: 10 mg via INTRAVENOUS

## 2013-07-19 MED ORDER — CHLORHEXIDINE GLUCONATE 0.12 % MT SOLN
15.0000 mL | Freq: Two times a day (BID) | OROMUCOSAL | Status: DC
  Administered 2013-07-19 – 2013-07-24 (×10): 15 mL via OROMUCOSAL
  Filled 2013-07-19 (×11): qty 15

## 2013-07-19 MED ORDER — SODIUM CHLORIDE 0.9 % IV SOLN
INTRAVENOUS | Status: AC
  Administered 2013-07-19: 12:00:00 via INTRAVENOUS

## 2013-07-19 MED ORDER — PANTOPRAZOLE SODIUM 40 MG IV SOLR
40.0000 mg | Freq: Every day | INTRAVENOUS | Status: DC
  Administered 2013-07-19 – 2013-07-20 (×2): 40 mg via INTRAVENOUS
  Filled 2013-07-19 (×2): qty 40

## 2013-07-19 NOTE — Anesthesia Preprocedure Evaluation (Signed)
Anesthesia Evaluation  Patient identified by MRN, date of birth, ID band Patient awake    Reviewed: Allergy & Precautions, H&P , NPO status , Patient's Chart, lab work & pertinent test results, reviewed documented beta blocker date and time   Airway Mallampati: II TM Distance: >3 FB Neck ROM: full    Dental   Pulmonary shortness of breath and with exertion, sleep apnea , Current Smoker,  breath sounds clear to auscultation        Cardiovascular hypertension, On Medications + CAD, + Past MI and +CHF negative cardio ROS  + dysrhythmias + Cardiac Defibrillator + Valvular Problems/Murmurs Rhythm:regular     Neuro/Psych  Headaches, PSYCHIATRIC DISORDERS    GI/Hepatic Neg liver ROS, GERD-  Medicated and Controlled,  Endo/Other  negative endocrine ROS  Renal/GU Renal InsufficiencyRenal disease  negative genitourinary   Musculoskeletal   Abdominal   Peds  Hematology negative hematology ROS (+)   Anesthesia Other Findings See surgeon's H&P   Reproductive/Obstetrics negative OB ROS                           Anesthesia Physical Anesthesia Plan  ASA: V and emergent  Anesthesia Plan: General   Post-op Pain Management:    Induction: Intravenous  Airway Management Planned: Oral ETT  Additional Equipment:   Intra-op Plan:   Post-operative Plan: Post-operative intubation/ventilation  Informed Consent: I have reviewed the patients History and Physical, chart, labs and discussed the procedure including the risks, benefits and alternatives for the proposed anesthesia with the patient or authorized representative who has indicated his/her understanding and acceptance.   Dental Advisory Given  Plan Discussed with: CRNA and Surgeon  Anesthesia Plan Comments:         Anesthesia Quick Evaluation

## 2013-07-19 NOTE — Progress Notes (Signed)
Chaplain requested for family support during coe stroke. Presented to pt's mother and fiancee in Radiology waiting area. Both were teary, and eager to share. Pt's mother explained that pt began having heart problems in 1999 and has had a "rough time" every since. 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 She said "It could go either way right now." She said, "But I have faith that he's going to be okay." Chaplain listened empathically to family's concerns, fear, and worry, provided emotional and spiritual support, and prayed with family.   They know to request additional chaplain support at any time today.  Please page if needed or requested.  Guy Sandifer [place], [place] [phone] General: [phone]

## 2013-07-19 NOTE — Code Documentation (Signed)
53yo male for planned right and left heart catheterization this morning.  Patient was last known well at 2 when his RN gave him his medications.  She reports that he had no problems taking the medications, and she changed his gown and assessed no weakness on the left side while doing so.  Around 0905 the patient was in the cath lab and the staff attempted to ask him questions and he was found to be nonverbal and flaccid on the left side.  Patient was transferred back to his room and a Code Stroke was called.  The patient has a h/o VAD that was placed at [facility], patient is on the list for a heart transplant, VAD RN at bedside.  Stroke team to bedside.  Heparin gtt stopped per MD order.  Patient taken to CT and CT read by neurologist.  Patient is contraindicated for treatment with tPA d/t INR 1.9 on Coumadin.  NIHSS 18, see documentation for details.  Patient continued to be nonverbal with left hemiparesis.  IR staff notified and patient transferred to the IR nursing station.  Patient prepared for IR procedure.  Consent obtained from patient's mother via telephone.  Patient transferred to the IR suite.  Bedside handoff completed with IR RN Peggy.  VAD RN at bedside for procedure.  See code stroke log for documentation and times.

## 2013-07-19 NOTE — Consult Note (Signed)
Name: [name] MRN: [id number] DOB: 03/27/1961    ADMISSION DATE:  07/17/2013 CONSULTATION DATE:  1/26  REFERRING MD :  [name] PRIMARY SERVICE: Bensimhon  CHIEF COMPLAINT:  Vent weaning s/p cerebral arteriogram    BRIEF PATIENT DESCRIPTION:  This is a 53 year old male w/ known h/o NICM EF 10-15% w/ severe MR. He [facility], as [facility] to transplant. Admitted for evaluation of new NSTEMI felt possibly to be related to pump thrombosis. Hosp course complicated by episodes of CP and hypotension on 1/24 pm hours. 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 PCCM asked to see post-op for vent weaning.   SIGNIFICANT EVENTS / STUDIES:  1/24: admitted w/ CP and trop rise. Did have sub-therapeutic INR at home so concern was for pump thrombosis.  1/24: evening had CP, rising Trop I, got mso4 and NTG>>>got hypotensive requiring pressors.  1/25 INR supra-therapeutic. Coumadin stopped, heparin started 1/26: CT head: negative for bleed.  1/26:cerebral arteriogram:  acute right MCA embolic CVA. Not amendable to extraction due to distal location.    LINES / TUBES: LVAD OETT 1/26>>> Right PICC 1/25>>> Right fem arterial sheath 1/26>>> CULTURES:   ANTIBIOTICS:   HISTORY OF PRESENT ILLNESS:   This is a 53 year old male w/ known h/o NICM EF 10-15% w/ severe MR. He is [facility] March 2013, as bridge to transplant (which he is still pending work-up to get on list). Admitted on 1/24 w/ CP. Initially noted on 1/22 and 1/23 w/ associated diaphoresis, but resolved spont. Then again occurred on the am of admit during intercourse. This time the pain radiated to the right arm and he noted transient drop in pump flow. In ER his Troponin was 12 CP was resolved, he was noted to have sub- therapeutic INR. He was admitted for evaluation of new NSTEMI felt possibly to be related to pump thrombosis. Course complicated by episode  of CP followed by hypotension on the evening of admit which eventually require pressor support. On 1/25 he was noted to have supra-therapeutic INR so was given vitamin K, but heparin started. He was to undergo left and right heart cath on 1/26. Then during the am hours (0740am)on 1/26 he developed acute onset of left sided weakness and aphasia. CT head was negative of acute bleed. Given his history he was not a candidate for TPA. He was brought to interventional radiology in hope that clot might be able to be extracted. Had cerebral arteriogram which showed distal small filling defects in the orbitofrontal RT MCA not amendable to intervention due to distal location. Returned to the ICU on the vent. PCCM asked to assist in vent weaning.   PAST MEDICAL HISTORY :  Past Medical History  Diagnosis Date  . CHF (congestive heart failure)     EF- 10-15  . Medically noncompliant   . Mitral regurgitation   . Tobacco user   . HTN (hypertension)   . AICD (automatic cardioverter/defibrillator) present   . GERD (gastroesophageal reflux disease)   . Substance abuse   . Chronic renal insufficiency   . Syncope   . Thrombus 08/06/2010  . SYSTOLIC HEART FAILURE, CHRONIC 09/22/2008    Qualifier: Diagnosis of  By: [name], MD, Trixie DredgeFACC, Daniel R   . LV (left ventricular) mural thrombus 01/28/2011  . ICD - IN SITU 09/16/2008    Qualifier: Diagnosis of  By: Wonda AmisBednar, NP-C, Michelle    .  MITRAL STENOSIS/ INSUFFICIENCY, NON-RHEUMATIC 09/22/2008    Qualifier: Diagnosis of  By: Gala Romney, MD, Trixie Dredge Hepatomegaly 09/16/2008    Qualifier: Diagnosis of  By: Wonda Amis    . High cholesterol 02/26/2012    "at one time"  . Sleep apnea   . Exertional dyspnea 02/26/2012  . History of blood transfusion 08/2011    "when I had heart pump"  . Migraines   . COMMON MIGRAINE 06/14/2009    Qualifier: Diagnosis of  By: Jonny Ruiz MD, Len Blalock   . History of gout 02/26/2012  . Depression   . Bipolar affective disorder  10/22/2011    pt denies this hx 02/26/2012   Past Surgical History  Procedure Laterality Date  . Cardiac defibrillator placement  ~ 2008  . Left ventricular assist device  08/2011   Prior to Admission medications   Medication Sig Start Date End Date Taking? Authorizing Provider  amLODipine (NORVASC) 10 MG tablet Take 10 mg by mouth daily.   Yes Historical Provider, MD  aspirin EC 81 MG tablet Take 81 mg by mouth daily.   Yes Historical Provider, MD  carvedilol (COREG) 25 MG tablet Take 50 mg by mouth 2 (two) times daily with a meal.   Yes Historical Provider, MD  clopidogrel (PLAVIX) 75 MG tablet Take 1 tablet (75 mg total) by mouth daily with breakfast. 08/08/12  Yes [name], NP  gabapentin (NEURONTIN) 300 MG capsule Take 300 mg by mouth 3 (three) times daily.   Yes Historical Provider, MD  hydrALAZINE (APRESOLINE) 100 MG tablet Take 100 mg by mouth 3 (three) times daily.   Yes Historical Provider, MD  losartan (COZAAR) 100 MG tablet Take 100 mg by mouth daily.   Yes Historical Provider, MD  magnesium oxide (MAG-OX) 400 MG tablet Take 400 mg by mouth daily.   Yes Historical Provider, MD  potassium chloride (K-DUR,KLOR-CON) 10 MEQ tablet Take 20 mEq by mouth daily.   Yes Historical Provider, MD  ranitidine (ZANTAC) 150 MG tablet Take 150 mg by mouth 2 (two) times daily.    Yes Historical Provider, MD  torsemide (DEMADEX) 20 MG tablet Take 2 tablets (40 mg total) by mouth daily. 09/14/12 09/14/13 Yes [name], NP  warfarin (COUMADIN) 5 MG tablet Take 5 mg by mouth daily.  08/08/12  Yes [name], NP  allopurinol (ZYLOPRIM) 100 MG tablet Take 100 mg by mouth as needed. For gout    Historical Provider, MD   Allergies  Allergen Reactions  . Ace Inhibitors Cough  . Lexapro [Escitalopram Oxalate] Other (See Comments)    somnolence    FAMILY HISTORY:  Family History  Problem Relation Age of Onset  . Coronary artery disease Neg Hx    SOCIAL HISTORY:  reports that he quit  smoking about 2 years ago. His smoking use included Cigarettes. He has a 8.25 pack-year smoking history. He quit smokeless tobacco use about 2 years ago. He reports that he does not drink alcohol or use illicit drugs.  REVIEW OF SYSTEMS:   Unable   SUBJECTIVE:  Coughing, copious oral secretions  VITAL SIGNS: Temp:  [97.2 F (36.2 C)-98.5 F (36.9 C)] 98.5 F (36.9 C) (01/26 0800) Pulse Rate:  [84-125] 108 (01/26 0900) Resp:  [10-38] 17 (01/26 0900) SpO2:  [91 %-100 %] 98 % (01/26 0900) Weight:  [79.5 kg (175 lb 4.3 oz)] 79.5 kg (175 lb 4.3 oz) (01/26 0500) HEMODYNAMICS: CVP:  [14 mmHg-27 mmHg] 18 mmHg  VENTILATOR SETTINGS:   INTAKE / OUTPUT: Intake/Output     01/25 0701 - 01/26 0700 01/26 0701 - 01/27 0700   P.O. 600    I.V. (mL/kg) 1341.2 (16.9) 219 (2.8)   IV Piggyback 600    Total Intake(mL/kg) 2541.2 (32) 219 (2.8)   Urine (mL/kg/hr) 2100 (1.1)    Total Output 2100     Net +441.2 +219        Urine Occurrence  1 x     PHYSICAL EXAMINATION: General:  53 year old aam currently orally intubated. Copious airway secretions  Neuro:  No f/c. Still w/ residual NMB on board after neuro-IR  HEENT:  Orally intubated. Copious secretions from mouth and frothy ETT secretions  Cardiovascular:  LVAD pump hum. No edema  Lungs:  Coarse scattered rhonchi  Abdomen:  Soft, nontender  Musculoskeletal:  Intact  Skin:  Intact  LABS:  CBC  Recent Labs Lab 07/18/13 1545 07/18/13 1930 07/19/13 0548  WBC 7.0 6.7 7.9  HGB 15.1 14.4 15.1  HCT 43.9 43.0 44.6  PLT 208 231 199   Coag's  Recent Labs Lab 07/18/13 0149 07/18/13 1930 07/19/13 0533  INR 3.58* 4.97* 1.92*   BMET  Recent Labs Lab 07/17/13 0923 07/18/13 0149 07/19/13 0500  NA 138 135* 136*  K 5.8* 4.8 3.8  CL 98 97 95*  CO2 25 20 26   BUN 15 16 12   CREATININE 1.46* 1.40* 1.28  GLUCOSE 179* 184* 139*   Electrolytes  Recent Labs Lab 07/17/13 0923 07/18/13 0149 07/19/13 0500  CALCIUM 9.6 8.7 8.8  MG  --   1.9  --    Sepsis Markers No results found for this basename: LATICACIDVEN, PROCALCITON, O2SATVEN,  in the last 168 hours ABG No results found for this basename: PHART, PCO2ART, PO2ART,  in the last 168 hours Liver Enzymes  Recent Labs Lab 07/17/13 0923 07/18/13 0149 07/19/13 0500  AST 152* 223* 471*  ALT 31 43 80*  ALKPHOS 87 98 85  BILITOT 0.6 0.4 1.8*  ALBUMIN 4.2 3.9 3.6   Cardiac Enzymes  Recent Labs Lab 07/17/13 0923 07/17/13 1525 07/17/13 2017  TROPONINI 12.76* 17.02* 19.35*  PROBNP 2538.0*  --   --    Glucose No results found for this basename: GLUCAP,  in the last 168 hours  Imaging Ct Head Wo Contrast  07/19/2013   CLINICAL DATA:  Code stroke, sudden onset of left-sided weakness, agonal respirations  EXAM: CT HEAD WITHOUT CONTRAST  TECHNIQUE: Contiguous axial images were obtained from the base of the skull through the vertex without intravenous contrast.  COMPARISON:  CT HEAD W/O CM dated 06/03/2005; CT HEAD W/O CM dated 06/01/2005  FINDINGS: Geographic area of subcortical encephalomalacia within the anterior lateral aspect of the temporal lobe (image 6, series 2) at the site of the patient's prior hemorrhagic contusion. The gray-white differentiation is otherwise well maintained. No CT evidence of acute large territory infarct. Interval resolution of previously noted left-sided frontoparietal subarachnoid hemorrhage. No intraparenchymal or extra-axial hemorrhage. Unchanged size a configuration of the ventricles and basilar cisterns. No midline shift. Limited visualization of the paranasal sinuses and mastoid air cells are normal. Regional soft tissues appear normal. No displaced calvarial fracture.  IMPRESSION: 1. No acute intracranial process. 2. Very minimal amount of geographic subcortical encephalomalacia within in the anterior lateral aspect of the left temporal lobe, at the site of the patient's prior hemorrhagic contusion. Critical Value/emergent results were called  by telephone at the time of interpretation on 07/19/2013 at  9:51 AM to Dr. Ritta Slot , who verbally acknowledged these results.   Electronically Signed   By: Simonne Come M.D.   On: 07/19/2013 09:55   Dg Chest Port 1 View  07/18/2013   CLINICAL DATA:  Line placement  EXAM: PORTABLE CHEST - 1 VIEW  COMPARISON:  07/17/2013  FINDINGS: LVAD remains in place with evidence of previous median sternotomy and left-sided AICD placement. Right-sided PICC line has been placed with tip over the cavoatrial junction. No pneumothorax.  IMPRESSION: Right subclavian suspect right-sided PICC line tip at cavoatrial junction without pneumothorax.   Electronically Signed   By: Christiana Pellant M.D.   On: 07/18/2013 12:47     CXR:   ASSESSMENT / PLAN:  PULMONARY A: acute respiratory failure  S/p acute right MCA CVA. Has copious airway secretions. Concerned about element of edema. Pulmonary exam had been unremarkable prior.   P:   Full vent support Sedation w/ diprivan to allow for neuro-exam  F/u CXR and abg Will await CXR results and would like to see more awake neuro exam before starting weaning. Also would be nice to see trop I drifting down prior to weaning   CARDIOVASCULAR A:  NSTEMI (felt to be Pump thrombosis) Chronic systolic HF (EF 10-15%) w/ LVAD placed in march 2013 at [facility].  Cardiogenic shock  P Transduce CVP from PICC LVAD per cards levophed for MAP >70  Asa, plavix and heparin per cards   RENAL A:  AKI in setting of hypoperfusion.  His scr had improved. He is now s/p contrast load for cerebral arteriogram. Worried that this could result in repeat renal insult.  P:   Avoid hypovolemia MAP goals >70 F/u chemistry  Renal dose meds   GASTROINTESTINAL A:   Rising LFTs in setting of on-going ischemia  At risk for dysphagia   P:   PPI for PUD  NPO  Will need swallow eval after off vent   HEMATOLOGIC A:   Chronic anticoagulation (on coumadin for LVAD) Concern about acute  pump related embolic event  P:  Heparin per pharmacy   INFECTIOUS A:  No acute, but at risk for aspiration  P:   F/u cxr, Culture sputum if infiltrate  Cycle PCT if spikes fever Low threshold for empiric abx   ENDOCRINE A:  Mild hyperglycemia  P:   ssi if glucose > 150   NEUROLOGIC A:  Right MCA embolic CVA. Not amendable to IR  P:   Supportive care  recs per neuro   TODAY'S SUMMARY: need to see better neuro-exam. Currently concerned about ability to protect airway. Will collaborate w/ neuro re: timing of extubation. For now will ensure appropriate supportive care.   I have personally obtained a history, examined the patient, evaluated laboratory and imaging results, formulated the assessment and plan and placed orders.  CRITICAL CARE: The patient is critically ill with multiple organ systems failure and requires high complexity decision making for assessment and support, frequent evaluation and titration of therapies, application of advanced monitoring technologies and extensive interpretation of multiple databases. Critical Care Time devoted to patient care services described in this note is 40 minutes.   Alyson Reedy, M.D. [facility] Pulmonary/Critical Care Medicine. Pager: [phone]. After hours pager: [phone].

## 2013-07-19 NOTE — Progress Notes (Signed)
ANTICOAGULATION CONSULT NOTE - Follow Up Consult  Pharmacy Consult for heparin Indication: chest pain/ACS and hx LVAD  Allergies  Allergen Reactions  . Ace Inhibitors Cough  . Lexapro [Escitalopram Oxalate] Other (See Comments)    somnolence    Patient Measurements: Height: 5\' 4"  (162.6 cm) Weight: 175 lb 4.3 oz (79.5 kg) IBW/kg (Calculated) : 59.2   Vital Signs: Temp: 98.5 F (36.9 C) (01/26 1200) Temp src: Oral (01/26 1200) Pulse Rate: 103 (01/26 1430)  Labs:  Recent Labs  07/17/13 0923 07/17/13 1525  07/17/13 2017 07/18/13 0149 07/18/13 1545 07/18/13 1930 07/18/13 2100 07/19/13 0500 07/19/13 0533 07/19/13 0548 07/19/13 1220  HGB 17.3*  --   --   --  16.5 15.1 14.4  --   --   --  15.1  --   HCT 49.5  --   --   --  48.0 43.9 43.0  --   --   --  44.6  --   PLT 269  --   --   --  263 208 231  --   --   --  199  --   LABPROT 21.3*  --   --   --  34.4*  --  44.9*  --   --  21.4*  --  17.4*  INR 1.91*  --   --   --  3.58*  --  4.97*  --   --  1.92*  --  1.47  HEPARINUNFRC  --   --   < > 0.10* 0.36  0.39  --  >2.20* 0.43  --  <0.10*  --  <0.10*  CREATININE 1.46*  --   --   --  1.40*  --   --   --  1.28  --   --   --   TROPONINI 12.76* 17.02*  --  19.35*  --   --   --   --   --   --   --   --   < > = values in this interval not displayed.  Estimated Creatinine Clearance: 65.7 ml/min (by C-G formula based on Cr of 1.28).  Assessment: 53 yo male with LVAD admitted 07/17/2013 with elevated troponin.  Pharmacy consulted to dose heparin.  Coag: LVAD, ACS, INR elevated this am  (extra warfarin doses given over last 4 days are likely catching up, + now on pressors s/p drop in MAP last night).  Vit K 2mg  po given today with recheck INR increased 3.58>4.97.  Will again give Vit K  - discussed with Dr. Gala Romney, as plan for cath lab in am.  Heparin drip  900 uts/hr heparin level 2.2 - drawn from PICC line with heparin running - recheck from peripheral stick  0.43 at goal. CBC  stable,  no bleeding noted.  1/26 - Heparin level this AM < 0.1 despite previously therapeutic on same rate.  Pt then went to cath lab, then IR for acute stroke.  Pharmacy asked to resume IV heparin 4 hrs after sheath out.  Spoke to RN, sheath just now pulled.  Goal of Therapy:  Heparin level 0.3-0.7 units/ml Monitor platelets by anticoagulation protocol: Yes INR 2-3  Plan:  1. At 1900 PM will resume IV heparin at 900 units/hr. 2. Check heparin level 6 hrs after heparin resumed. 3. Daily heparin level and CBC.  Tad Moore, BCPS  Clinical Pharmacist Pager [phone]  07/19/2013 2:36 PM

## 2013-07-19 NOTE — Procedures (Signed)
S/P 4 vessel cerebral arteriogram Rt CFa approach. Findings. Distal small filling defects in the the orbitofrontal br of RT MCA sup division,and tertiary angular division of RT MCA

## 2013-07-19 NOTE — Procedures (Signed)
**Note De-Identified [name] Obfuscation** Arterial Catheter Insertion Procedure Note [name] [id number] [birth year]  Procedure: Insertion of Arterial Catheter  Indications: Blood pressure monitoring and Frequent blood sampling  Procedure Details Consent: Risks of procedure as well as the alternatives and risks of each were explained to the (patient/caregiver).  Consent for procedure obtained. Time Out: Verified patient identification, verified procedure, site/side was marked, verified correct patient position, special equipment/implants available, medications/allergies/relevent history reviewed, required imaging and test results available.  Performed  Maximum sterile technique was used including antiseptics, cap, gloves, gown, hand hygiene, mask and sheet. Skin prep: Chlorhexidine; local anesthetic administered 20 gauge catheter was inserted into left radial artery using the Seldinger technique.  Evaluation Blood flow good; BP tracing good. Complications: No apparent complications.   [name], Megan Salon 07/19/2013

## 2013-07-19 NOTE — Consult Note (Addendum)
Neurology Consultation Reason for Consult: Stroke Referring Physician: Bensimhon, D  CC: Left-sided weakness  History is obtained from: Patient, staff  HPI: [name] is a 53 y.o. male left-handed male who presents with sudden onset left-sided weakness and aphasia. He has a history of congestive heart failure and has a left ventricular assist device in place. His INR was low at home, and he loaded himself and became supratherapeutic here. He was given vitamin K yesterday, his INR came down to 1.9. He is started on heparin.  Around 7:40 AM, the nurse helped him to get dressed and stated that he seemed sleepy, he was moving his left side well. The patient endorses that he had symptoms start after he woke up this morning, but woke up in his normal state.  He appears to understand well.   LKW: 7:40 AM tpa given?: No, on systemic heparin and INR 1.9    ROS: A 14 point ROS was performed and is negative except as noted in the HPI.  Past Medical History  Diagnosis Date  . CHF (congestive heart failure)     EF- 10-15  . Medically noncompliant   . Mitral regurgitation   . Tobacco user   . HTN (hypertension)   . AICD (automatic cardioverter/defibrillator) present   . GERD (gastroesophageal reflux disease)   . Substance abuse   . Chronic renal insufficiency   . Syncope   . Thrombus 08/06/2010  . SYSTOLIC HEART FAILURE, CHRONIC 09/22/2008    Qualifier: Diagnosis of  By: Gala Romney, MD, Trixie Dredge   . LV (left ventricular) mural thrombus 01/28/2011  . ICD - IN SITU 09/16/2008    Qualifier: Diagnosis of  By: Wonda Amis    . MITRAL STENOSIS/ INSUFFICIENCY, NON-RHEUMATIC 09/22/2008    Qualifier: Diagnosis of  By: Gala Romney, MD, Trixie Dredge Hepatomegaly 09/16/2008    Qualifier: Diagnosis of  By: Wonda Amis    . High cholesterol 02/26/2012    "at one time"  . Sleep apnea   . Exertional dyspnea 02/26/2012  . History of blood transfusion 08/2011    "when I had  heart pump"  . Migraines   . COMMON MIGRAINE 06/14/2009    Qualifier: Diagnosis of  By: Jonny Ruiz MD, Len Blalock   . History of gout 02/26/2012  . Depression   . Bipolar affective disorder 10/22/2011    pt denies this hx 02/26/2012    Family History: Unable to assess secondary to patient's altered mental status.    Social History: Tob: Unable to assess secondary to patient's altered mental status.    Exam: Current vital signs: BP 50/0  Pulse 105  Temp(Src) 98.5 F (36.9 C) (Oral)  Resp 28  Ht 5\' 4"  (1.626 m)  Wt 79.5 kg (175 lb 4.3 oz)  BMI 30.07 kg/m2  SpO2 94% Vital signs in last 24 hours: Temp:  [97.2 F (36.2 C)-98.5 F (36.9 C)] 98.5 F (36.9 C) (01/26 0800) Pulse Rate:  [84-125] 105 (01/26 0800) Resp:  [10-38] 28 (01/26 0800) SpO2:  [91 %-100 %] 94 % (01/26 0800) Weight:  [79.5 kg (175 lb 4.3 oz)] 79.5 kg (175 lb 4.3 oz) (01/26 0500)  General: In bed CV: Normal rate Mental Status: Patient is awake, alert, he is completely unable to speak, but is able to nod head and answer questions(with nods/ shakes) and follow commands briskly.  no clear signs of neglect, though difficult to test extinction due to his expressive aphasia Cranial Nerves: II: Left  hemianopia Pupils are equal, round, and reactive to light.   III,IV, VI: EOMI without ptosis or diploplia.  V: Facial sensation is unclear VII: Facial movement is notable for left true VIII: hearing is intact to voice X: Uvula elevates symmetrically XI: Shoulder shrug is symmetric. XII: tongue is midline without atrophy or fasciculations.  Motor: Tone is normal. Bulk is normal. 5/5 strength was present on the right, on the left he has 2/5 strength of both his arm and leg Sensory: Endorses decreased sensation on the left compared to right Deep Tendon Reflexes: 2+ and symmetric in the biceps and patellae.  Cerebellar: No clear signs of ataxia when reaching for objects with the right arm   I have reviewed labs in epic and  the results pertinent to this consultation are: INR 1.9 Recently started on heparin  I have reviewed the images obtained: CT head-no acute bleed  Impression: 53 year old male with a history of left ventricular assist device and embolic infarct. Due to his elevated anticoagulation, he is not a candidate for IV TPA. He will be taken for interventional radiology.  Recommendations: 1) Interventional radiology.  2) lipid panel 3) will need PT, OT 4) Will need MRI after procedure, decision for anticoagulation will need to be made on a risk/benefit analysis following this study  This patient is critically ill and at significant risk of neurological worsening, death and care requires constant monitoring of vital signs, hemodynamics,respiratory and cardiac monitoring, neurological assessment, discussion with family, other specialists and medical decision making of high complexity. I spent 45 minutes of neurocritical care time  in the care of  this patient.  [name], MD [facility] [phone]  If 7pm- 7am, please page neurology on call at [phone].  07/19/2013  10:24 AM

## 2013-07-19 NOTE — Clinical Documentation Improvement (Signed)
Possible Clinical Conditions?  Acute Systolic Congestive Heart Failure Acute on Chronic Systolic Congestive Heart Failure Other Condition Cannot Clinically Determine   Risk Factors: Patient with a history of chronic systolic heart failure per 01/24 progress notes.  Diagnostics: 01/24: proBNP: 2538.0  Thank You, Marciano Sequin, Clinical Documentation Specialist:  [phone]  [facility]- Health Information Management

## 2013-07-19 NOTE — Transfer of Care (Signed)
Immediate Anesthesia Transfer of Care Note  Patient: [name]  Procedure(s) Performed: Procedure(s): RADIOLOGY WITH ANESTHESIA (N/A)  Patient Location: PACU and SICU  Anesthesia Type:General  Level of Consciousness: Patient remains intubated per anesthesia plan  Airway & Oxygen Therapy: Patient remains intubated per anesthesia plan and Patient placed on Ventilator (see vital sign flow sheet for setting)  Post-op Assessment: Report given to PACU RN and Post -op Vital signs reviewed and stable  Post vital signs: Reviewed and stable  Complications: No apparent anesthesia complications

## 2013-07-19 NOTE — Anesthesia Postprocedure Evaluation (Signed)
  Anesthesia Post-op Note  Patient: [name]  Procedure(s) Performed: Procedure(s): RADIOLOGY WITH ANESTHESIA (N/A)  Patient Location: ICU  Anesthesia Type:General  Level of Consciousness: sedated  Airway and Oxygen Therapy: Patient remains intubated per anesthesia plan and Patient placed on Ventilator (see vital sign flow sheet for setting)  Post-op Pain: none  Post-op Assessment: Post-op Vital signs reviewed, Patient's Cardiovascular Status Stable, Respiratory Function Stable, Patent Airway, No signs of Nausea or vomiting and Pain level controlled  Post-op Vital Signs: Reviewed and stable  Complications: No apparent anesthesia complications

## 2013-07-19 NOTE — ED Notes (Signed)
Rt femoral artery accessed

## 2013-07-19 NOTE — Progress Notes (Signed)
0900 Transferred to cath lab, pt not moving left side, incontinent of urine, lethargic. Returned to unit, Dr. Milas Kocher paged. Code stroke called. To CT via bed and monitor.

## 2013-07-19 NOTE — Progress Notes (Signed)
Patient ID: Eric LovelyUndra [name], male   DOB: 12/19/1960, 53 y.o.   MRN: [id number] Advanced Heart Failure Rounding Note  [name] is 53 y/o [place] male with a history of severe CHF due to NICM EF 10-15%, and severe MR s/p HM II [facility]. His medical history also consists of CRI, NSVT s/p ICD and LV thrombus.   Has been doing very well. [facility]. Pending work-up for transplant listing currently. On Thursday and Friday had one episode of central CP each day at rest associates with mild diaphoresis. Resolved spontaneously. This am severe episode of CP during sex. With severe diaphoresis and radiation to R arm. Noted pump flow transiently < 2 but back up quickly. Came to ER CP resolved. Received ASA. ECG unchanged from previous. Troponin 12 => 19.   Says INR has been low (checks at home). Thurs INR 1.2 took 10mg  warfarin in (typical dose is 5mg )  Fri INR 1.5 took 6mg  warfarin. INR Saturday 1.9. No TIA or neuro symptoms. LDH high around 900. No bleeding   Denies LVAD alarms. Denies driveline trauma, erythema or drainage. Denies ICD shocks.  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   Chest pain Saturday night: received NTG with low flow and hypotension, norepinephrine started.   BP stabilized.  More chest pain Sunday morning resolved with morphine.  Currently chest pain free.  Low flow episodes yesterday afternoon for @ 3 hours; fluid bolus given and torsemide stopped.   This morning: MAP 84 - 10470s, HR 100s ST.  INR 1.9 today after 2 mg vitamin K. He is on ASA, Plavix, and heparin gtt.   This am on way to cath lab developed acute onset of aphasia and left-sided hemiparesis. CT negative for bleed. Taken emergently to neuro-interventional suite but no amenable lesions. Remains on levophed.   VAD interrogated personally.  Flow 4.2 Speed 9400 PI 5.2  Power 5.4.  Events:  24 PI events  Alarms:  low flows yesterday afternoon for 3 hours  CVP 15  MAP's:  84 - 104  Filed Vitals:   07/19/13 1245 07/19/13 1300 07/19/13 1315 07/19/13 1330  BP:      Pulse: 101     Temp:      TempSrc:      Resp: 19 14 30 22   Height:      Weight:      SpO2: 100%       Intake/Output Summary (Last 24 hours) at 07/19/13 1353 Last data filed at 07/19/13 1300  Gross per 24 hour  Intake 2331.16 ml  Output   2100 ml  Net 231.16 ml    LABS: Basic Metabolic Panel:  Recent Labs  96/29/5201/25/15 0149 07/19/13 0500  NA 135* 136*  K 4.8 3.8  CL 97 95*  CO2 20 26  GLUCOSE 184* 139*  BUN 16 12  CREATININE 1.40* 1.28  CALCIUM 8.7 8.8  MG 1.9  --    Liver Function Tests:  Recent Labs  07/18/13 0149 07/19/13 0500  AST 223* 471*  ALT 43 80*  ALKPHOS 98 85  BILITOT 0.4 1.8*  PROT 8.4* 7.6  ALBUMIN 3.9 3.6   No results found for this basename: LIPASE, AMYLASE,  in the last 72 hours CBC:  Recent Labs  07/17/13 0923 07/18/13 0149  07/18/13 1930 07/19/13 0548  WBC 5.7 7.7  < > 6.7 7.9  NEUTROABS 3.8 6.1  --   --   --  HGB 17.3* 16.5  < > 14.4 15.1  HCT 49.5 48.0  < > 43.0 44.6  MCV 87.9 88.6  < > 89.6 88.1  PLT 269 263  < > 231 199  < > = values in this interval not displayed. Cardiac Enzymes:  Recent Labs  07/17/13 0923 07/17/13 1525 07/17/13 2017  TROPONINI 12.76* 17.02* 19.35*   INR 1.9  LDH 485>462>7035  Co-ox 55.3>64.4  RADIOLOGY: Dg Chest Portable 1 View  07/17/2013   CLINICAL DATA:  Left-sided chest pain for 3 days, weakness and dizziness.  EXAM: PORTABLE CHEST - 1 VIEW  COMPARISON:  08/03/2012  FINDINGS: Evidence of median sternotomy noted with LVAD in place. Left-sided defibrillator noted. Mild prominence of the cardiac silhouette persists. Tricuspid valvuloplasty reidentified. Trace left pleural fluid or thickening persists. No new pulmonary opacity.  IMPRESSION: No new acute abnormality. Stable appearance of trace left pleural fluid or thickening.    Electronically Signed   By: Christiana Pellant M.D.   On: 07/17/2013 10:12    PHYSICAL EXAM General: Intubated sedated Neck: JVP to jaw Lungs: Clear to auscultation bilaterally with normal respiratory effort. CV: LVAD pump hum normal.  No peripheral edema.   Abdomen: Soft, nontender, no hepatosplenomegaly, no distention.  Neurologic: Intubated/sedated. Expressive aphasia prior to intubation. L sided paralysis Extremities: No clubbing or cyanosis.   TELEMETRY: Reviewed telemetry pt in sinus tachy in 100s   Assessment:   1. Acute embolic CVA 2. NSTEMI - Likely pump thrombosis with coronary embolus.  2. Chronic systolic HF s/p HM II VAD  3. NICM EF 15%  4. Hyperkalemia  5. HTN - MAP 70s currently   Plan/Discussion:    He has had acute CVA today likely due to pump thrombosis/embolism. Unfortunately not t-PA candidate and no interventional options. Will continue supportive care of CVA and NSTEMI. Restart heparin 4 hours after sheath pull. Continue levophed. Suspect he will have significant residual neurologic deficits.   Currently not candidate for cardiac cath. Continue to manage NSTEMI medically.  Echo reviewed. LV is small and AoV remains closed suggesting adequate unloading of LV via VAD.   I updated his mother and girlfriend at the bedside.   The patient is critically ill with multiple organ systems failure and requires high complexity decision making for assessment and support, frequent evaluation and titration of therapies, application of advanced monitoring technologies and extensive interpretation of multiple databases.   Critical Care Time devoted to patient care services described in this note is 35 Minutes.    Arvilla Meres MD 07/19/2013 1:53 PM

## 2013-07-19 NOTE — Anesthesia Procedure Notes (Signed)
Procedure Name: Intubation Date/Time: 07/19/2013 10:42 AM Performed by: Reine Just Pre-anesthesia Checklist: Patient identified, Emergency Drugs available, Suction available, Patient being monitored and Timeout performed Patient Re-evaluated:Patient Re-evaluated prior to inductionOxygen Delivery Method: Circle system utilized and Simple face mask Preoxygenation: Pre-oxygenation with 100% oxygen Intubation Type: IV induction, Rapid sequence and Cricoid Pressure applied Ventilation: Mask ventilation without difficulty Laryngoscope Size: Mac and 4 Grade View: Grade III Tube type: Subglottic suction tube Tube size: 8.0 mm Number of attempts: 1 Airway Equipment and Method: Patient positioned with wedge pillow and Stylet Placement Confirmation: ETT inserted through vocal cords under direct vision,  positive ETCO2 and breath sounds checked- equal and bilateral Secured at: 23 cm Tube secured with: Tape Dental Injury: Teeth and Oropharynx as per pre-operative assessment

## 2013-07-19 NOTE — Progress Notes (Signed)
SLP Cancellation Note  Patient Details Name: [name] MRN: [id number] DOB: 1960/12/11   Cancelled treatment:       Reason Eval/Treat Not Completed: Medical issues which prohibited therapy. Neuro ordered swallow eval, but pt intubated shortly after. Pt will need Swallow eval after extubation. Will sign off for now and await new orders when pt appropriate.   Harlon Ditty, [place] CCC-SLP [phone]  Claudine Mouton 07/19/2013, 2:59 PM

## 2013-07-19 NOTE — ED Notes (Signed)
Code stroke pt sedated per anesthesia

## 2013-07-20 ENCOUNTER — Encounter (HOSPITAL_COMMUNITY): Payer: Self-pay | Admitting: Interventional Radiology

## 2013-07-20 ENCOUNTER — Encounter: Payer: Self-pay | Admitting: *Deleted

## 2013-07-20 ENCOUNTER — Inpatient Hospital Stay (HOSPITAL_COMMUNITY): Payer: Medicare Other

## 2013-07-20 DIAGNOSIS — R079 Chest pain, unspecified: Secondary | ICD-10-CM | POA: Diagnosis not present

## 2013-07-20 DIAGNOSIS — T82897A Other specified complication of cardiac prosthetic devices, implants and grafts, initial encounter: Secondary | ICD-10-CM | POA: Diagnosis not present

## 2013-07-20 LAB — CARBOXYHEMOGLOBIN
Carboxyhemoglobin: 1.9 % — ABNORMAL HIGH (ref 0.5–1.5)
Methemoglobin: 1.7 % — ABNORMAL HIGH (ref 0.0–1.5)
O2 SAT: 82.8 %
TOTAL HEMOGLOBIN: 14.6 g/dL (ref 13.5–18.0)

## 2013-07-20 LAB — GLUCOSE, CAPILLARY
Glucose-Capillary: 155 mg/dL — ABNORMAL HIGH (ref 70–99)
Glucose-Capillary: 119 mg/dL — ABNORMAL HIGH (ref 70–99)

## 2013-07-20 LAB — CBC
HEMATOCRIT: 42 % (ref 39.0–52.0)
HEMOGLOBIN: 14.4 g/dL (ref 13.0–17.0)
MCH: 30.4 pg (ref 26.0–34.0)
MCHC: 34.3 g/dL (ref 30.0–36.0)
MCV: 88.8 fL (ref 78.0–100.0)
Platelets: 218 10*3/uL (ref 150–400)
RBC: 4.73 MIL/uL (ref 4.22–5.81)
RDW: 15.3 % (ref 11.5–15.5)
WBC: 9.2 10*3/uL (ref 4.0–10.5)

## 2013-07-20 LAB — HEMOGLOBIN FREE, PLASMA: HGB PLASMA: 5.3 mg/dL (ref 0.0–6.9)

## 2013-07-20 LAB — BASIC METABOLIC PANEL
BUN: 9 mg/dL (ref 6–23)
CALCIUM: 8.4 mg/dL (ref 8.4–10.5)
CO2: 23 meq/L (ref 19–32)
Chloride: 102 mEq/L (ref 96–112)
Creatinine, Ser: 1.1 mg/dL (ref 0.50–1.35)
GFR calc Af Amer: 89 mL/min — ABNORMAL LOW (ref >90)
GFR calc non Af Amer: 77 mL/min — ABNORMAL LOW (ref >90)
GLUCOSE: 147 mg/dL — AB (ref 70–99)
Potassium: 3.7 mEq/L (ref 3.7–5.3)
Sodium: 138 mEq/L (ref 137–147)

## 2013-07-20 LAB — PROTIME-INR
INR: 1.64 — ABNORMAL HIGH (ref 0.00–1.49)
Prothrombin Time: 19 seconds — ABNORMAL HIGH (ref 11.6–15.2)

## 2013-07-20 LAB — LIPID PANEL
Cholesterol: 166 mg/dL (ref 0–200)
HDL: 63 mg/dL (ref >39)
LDL Cholesterol: 84 mg/dL (ref 0–99)
TRIGLYCERIDES: 96 mg/dL (ref <150)
Total CHOL/HDL Ratio: 2.6 RATIO
VLDL: 19 mg/dL (ref 0–40)

## 2013-07-20 LAB — HEPARIN LEVEL (UNFRACTIONATED)
Heparin Unfractionated: 0.11 IU/mL — ABNORMAL LOW (ref 0.30–0.70)
Heparin Unfractionated: 0.33 IU/mL (ref 0.30–0.70)
Heparin Unfractionated: 0.37 IU/mL (ref 0.30–0.70)

## 2013-07-20 LAB — LACTATE DEHYDROGENASE: LDH: 1693 U/L — AB (ref 94–250)

## 2013-07-20 LAB — PROCALCITONIN: Procalcitonin: 0.13 ng/mL

## 2013-07-20 MED ORDER — PIPERACILLIN-TAZOBACTAM 3.375 G IVPB
3.3750 g | Freq: Three times a day (TID) | INTRAVENOUS | Status: AC
  Administered 2013-07-20 – 2013-07-29 (×29): 3.375 g via INTRAVENOUS
  Filled 2013-07-20 (×32): qty 50

## 2013-07-20 MED ORDER — AMIODARONE HCL IN DEXTROSE 360-4.14 MG/200ML-% IV SOLN
60.0000 mg/h | INTRAVENOUS | Status: AC
  Administered 2013-07-20 (×2): 60 mg/h via INTRAVENOUS
  Filled 2013-07-20: qty 200

## 2013-07-20 MED ORDER — MIDAZOLAM HCL 2 MG/2ML IJ SOLN
2.0000 mg | Freq: Once | INTRAMUSCULAR | Status: AC
  Administered 2013-07-21: 2 mg via INTRAVENOUS
  Filled 2013-07-20 (×2): qty 2

## 2013-07-20 MED ORDER — AMIODARONE LOAD VIA INFUSION
150.0000 mg | Freq: Once | INTRAVENOUS | Status: AC
  Administered 2013-07-20: 150 mg via INTRAVENOUS
  Filled 2013-07-20: qty 83.34

## 2013-07-20 MED ORDER — AMIODARONE HCL IN DEXTROSE 360-4.14 MG/200ML-% IV SOLN
30.0000 mg/h | INTRAVENOUS | Status: DC
  Administered 2013-07-20 – 2013-07-29 (×17): 30 mg/h via INTRAVENOUS
  Filled 2013-07-20 (×42): qty 200

## 2013-07-20 MED ORDER — ASPIRIN 300 MG RE SUPP
300.0000 mg | Freq: Every day | RECTAL | Status: DC
  Administered 2013-07-20 – 2013-07-24 (×5): 300 mg via RECTAL
  Filled 2013-07-20 (×6): qty 1

## 2013-07-20 MED ORDER — SODIUM CHLORIDE 0.9 % IV SOLN
3.0000 g | Freq: Four times a day (QID) | INTRAVENOUS | Status: DC
  Filled 2013-07-20 (×3): qty 3

## 2013-07-20 MED ORDER — VITAL HIGH PROTEIN PO LIQD
1000.0000 mL | ORAL | Status: DC
  Filled 2013-07-20 (×2): qty 1000

## 2013-07-20 MED ORDER — FENTANYL CITRATE 0.05 MG/ML IJ SOLN
25.0000 ug | INTRAMUSCULAR | Status: DC | PRN
  Administered 2013-07-20 – 2013-07-24 (×8): 100 ug via INTRAVENOUS
  Filled 2013-07-20 (×9): qty 2

## 2013-07-20 MED ORDER — VITAL HIGH PROTEIN PO LIQD
1000.0000 mL | ORAL | Status: DC
  Administered 2013-07-20 – 2013-07-21 (×2): 1000 mL
  Filled 2013-07-20 (×3): qty 1000

## 2013-07-20 MED ORDER — PANTOPRAZOLE SODIUM 40 MG PO PACK
40.0000 mg | PACK | Freq: Every day | ORAL | Status: DC
  Administered 2013-07-21 – 2013-07-23 (×3): 40 mg
  Filled 2013-07-20 (×5): qty 20

## 2013-07-20 MED ORDER — SODIUM CHLORIDE 0.9 % IV SOLN
INTRAVENOUS | Status: DC
  Administered 2013-07-22 – 2013-08-08 (×5): via INTRAVENOUS
  Administered 2013-08-09: 20 mL/h via INTRAVENOUS
  Administered 2013-08-10: 20:00:00 via INTRAVENOUS

## 2013-07-20 MED ORDER — FENTANYL CITRATE 0.05 MG/ML IJ SOLN
100.0000 ug | Freq: Once | INTRAMUSCULAR | Status: AC
  Administered 2013-07-25: 100 ug via INTRAVENOUS
  Filled 2013-07-20: qty 2

## 2013-07-20 NOTE — Progress Notes (Addendum)
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 He is s/p LVAD placement at [facility] March 2013, as bridge to transplant. Admitted for evaluation of new NSTEMI felt possibly to be related to pump thrombosis. Hosp course complicated by episodes of CP and hypotension on 1/24 pm hours. 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 PCCM asked to see post-op for vent weaning.    SIGNIFICANT EVENTS / STUDIES:  1/24: admitted w/ CP and trop rise. Did have sub-therapeutic INR at home so concern was for pump thrombosis.  1/24: evening had CP, rising Trop I, got mso4 and NTG>>>got hypotensive requiring pressors.  1/25 INR supra-therapeutic. Coumadin stopped, heparin started 1/26: CT head: negative for bleed.  1/26:cerebral arteriogram:  acute right MCA embolic CVA. Not amendable to extraction due to distal location.    LINES / TUBES: LVAD OETT 1/26>>> Right PICC 1/25>>> Right fem arterial sheath 1/26>>>  CULTURES:  ANTIBIOTICS: 1/27 unasyn (empiric, aspiration) >>   SUBJECTIVE:  Has not followed commands No PSV yet today 1/28   VITAL SIGNS: Temp:  [98.1 F (36.7 C)-100.3 F (37.9 C)] 98.3 F (36.8 C) (01/27 0817) Pulse Rate:  [101-127] 107 (01/27 0845) Resp:  [2-30] 14 (01/27 0845) BP: (103-108)/(92-97) 108/95 mmHg (01/27 0845) SpO2:  [98 %-100 %] 100 % (01/27 0845) Arterial Line BP: (91-153)/(88-122) 111/99 mmHg (01/27 0845) FiO2 (%):  [45 %-60 %] 45 % (01/27 0845) Weight:  [81 kg (178 lb 9.2 oz)] 81 kg (178 lb 9.2 oz) (01/27 0400) HEMODYNAMICS: CVP:  [14 mmHg-40 mmHg] 15 mmHg VENTILATOR SETTINGS: Vent Mode:  [-] PRVC FiO2 (%):  [45 %-60 %] 45 % Set Rate:  [14 bmp] 14  bmp Vt Set:  [550 mL] 550 mL PEEP:  [5 cmH20] 5 cmH20 Plateau Pressure:  [16 cmH20-20 cmH20] 16 cmH20 INTAKE / OUTPUT: Intake/Output     01/26 0701 - 01/27 0700 01/27 0701 - 01/28 0700   P.O.     I.V. (mL/kg) 3668.9 (45.3) 86 (1.1)   IV Piggyback     Total Intake(mL/kg) 3668.9 (45.3) 86 (1.1)   Urine (mL/kg/hr) 1380 (0.7) 200 (0.9)   Total Output 1380 200   Net +2288.9 -114        Urine Occurrence 1 x      PHYSICAL EXAMINATION: General:  53 year old aam currently orally intubated. Copious airway secretions  Neuro:  No f/c. Still w/ residual NMB on board after neuro-IR  HEENT:  Orally intubated. Copious secretions from mouth and frothy ETT secretions  Cardiovascular:  LVAD pump hum. No edema  Lungs:  Coarse scattered rhonchi  Abdomen:  Soft, nontender  Musculoskeletal:  Intact  Skin:  Intact  LABS:  CBC  Recent Labs Lab 07/18/13 1930 07/19/13 0548 07/20/13 0435  WBC 6.7 7.9 9.2  HGB 14.4 15.1 14.4  HCT 43.0 44.6 42.0  PLT 231 199 218   Coag's  Recent Labs Lab 07/19/13 0533 07/19/13 1220 07/20/13 0800  INR 1.92* 1.47 1.64*   BMET  Recent Labs Lab 07/17/13 0923 07/18/13 0149 07/19/13 0500  NA 138 135* 136*  K 5.8* 4.8 3.8  CL 98 97 95*  CO2 25 20 26  BUN 15 16 12   CREATININE 1.46* 1.40* 1.28  GLUCOSE 179* 184* 139*   Electrolytes  Recent Labs Lab 07/17/13 0923 07/18/13 0149 07/19/13 0500  CALCIUM 9.6 8.7 8.8  MG  --  1.9  --    Sepsis Markers No results found for this basename: LATICACIDVEN, PROCALCITON, O2SATVEN,  in the last 168 hours ABG  Recent Labs Lab 07/19/13 1301  PHART 7.476*  PCO2ART 36.1  PO2ART 210.0*   Liver Enzymes  Recent Labs Lab 07/17/13 0923 07/18/13 0149 07/19/13 0500  AST 152* 223* 471*  ALT 31 43 80*  ALKPHOS 87 98 85  BILITOT 0.6 0.4 1.8*  ALBUMIN 4.2 3.9 3.6   Cardiac Enzymes  Recent Labs Lab 07/17/13 0923 07/17/13 1525 07/17/13 2017  TROPONINI 12.76* 17.02* 19.35*  PROBNP 2538.0*  --   --     Glucose  Recent Labs Lab 07/19/13 1017  GLUCAP 125*    Imaging Ct Head Wo Contrast  07/20/2013   CLINICAL DATA:  Stroke. Left-sided weakness. Small, distal right MCA branch vessel filling defects on conventional catheter cerebral angiogram.  EXAM: CT HEAD WITHOUT CONTRAST  TECHNIQUE: Contiguous axial images were obtained from the base of the skull through the vertex without intravenous contrast.  COMPARISON:  07/19/2013  FINDINGS: There are new rounded, relatively well-circumscribed regions of hypoattenuation involving the left caudate and lentiform nucleus as well as the left thalamus. There are also more subtle, smaller hypodensities in the right caudate and right thalamus. No large, cortically based acute infarct is identified. There is mild mass effect on the frontal horn of the left lateral ventricle. There is no evidence of acute intracranial hemorrhage. Small focus of encephalomalacia in the anterolateral left temporal lobe is unchanged. Orbits are unremarkable. Mastoid air cells are clear. There is Ni mild to moderate mucosal thickening in the paranasal sinuses involving the frontal, ethmoid, and sphenoid sinuses bilaterally. Fluid levels are present in the sphenoid and right maxillary sinuses.  IMPRESSION: 1. New hypodensities in the left basal ganglia and left thalamus, concerning for evolving acute infarcts. Smaller foci of hypoattenuation in the right caudate and thalamus may also represent acute ischemia. 2. New paranasal sinus mucosal disease. These results were called by telephone at the time of interpretation on 07/20/2013 at 8:30 AM to Dr. Pearlean BrownieSethi, who verbally acknowledged these results.   Electronically Signed   By: Sebastian AcheAllen  Grady   On: 07/20/2013 08:30   Ct Head Wo Contrast  07/19/2013   CLINICAL DATA:  Code stroke, sudden onset of left-sided weakness, agonal respirations  EXAM: CT HEAD WITHOUT CONTRAST  TECHNIQUE: Contiguous axial images were obtained from the base of the skull  through the vertex without intravenous contrast.  COMPARISON:  CT HEAD W/O CM dated 06/03/2005; CT HEAD W/O CM dated 06/01/2005  FINDINGS: Geographic area of subcortical encephalomalacia within the anterior lateral aspect of the temporal lobe (image 6, series 2) at the site of the patient's prior hemorrhagic contusion. The gray-white differentiation is otherwise well maintained. No CT evidence of acute large territory infarct. Interval resolution of previously noted left-sided frontoparietal subarachnoid hemorrhage. No intraparenchymal or extra-axial hemorrhage. Unchanged size a configuration of the ventricles and basilar cisterns. No midline shift. Limited visualization of the paranasal sinuses and mastoid air cells are normal. Regional soft tissues appear normal. No displaced calvarial fracture.  IMPRESSION: 1. No acute intracranial process. 2. Very minimal amount of geographic subcortical encephalomalacia within in the anterior lateral aspect of the left temporal lobe, at the site of the patient's  prior hemorrhagic contusion. Critical Value/emergent results were called by telephone at the time of interpretation on 07/19/2013 at 9:51 AM to Dr. Ritta Slot , who verbally acknowledged these results.   Electronically Signed   By: Simonne Come M.D.   On: 07/19/2013 09:55   Portable Chest Xray In Am  07/20/2013   CLINICAL DATA:  Cerebral infarction and respiratory failure.  EXAM: PORTABLE CHEST - 1 VIEW  COMPARISON:  07/19/2013  FINDINGS: Endotracheal tube projects with its tip approximately 1 cm above the carina. Nasogastric tube extends into the stomach. Slightly more prominent left lower lobe atelectasis identified. No edema is present. Pacemaker and left ventricular assist device shows stable positioning.  IMPRESSION: Increased prominence of left lower lobe atelectasis.   Electronically Signed   By: Irish Lack M.D.   On: 07/20/2013 08:15   Dg Chest Port 1 View  07/19/2013   CLINICAL DATA:   Nasogastric tube placement.  Respiratory failure.  EXAM: PORTABLE CHEST - 1 VIEW  COMPARISON:  DG CHEST 1V PORT dated 07/19/2013; DG CHEST 1V PORT dated 07/18/2013  FINDINGS: Endotracheal tube tip is approximately 1 cm above the carina. A nasogastric tube has been placed which extends into the stomach. Appearance of the cardiac pacing/defibrillator device and left ventricular assist device are stable. Lungs show no significant edema or pleural effusions.  IMPRESSION: Nasogastric tube extends into the stomach.   Electronically Signed   By: Irish Lack M.D.   On: 07/19/2013 21:20   Dg Chest Port 1 View  07/19/2013   CLINICAL DATA:  ETT  EXAM: PORTABLE CHEST - 1 VIEW  COMPARISON:  07/18/2013  FINDINGS: Endotracheal tube terminates 4 cm above the carina.  Low lung volumes with vascular crowding and possible pulmonary vascular congestion. No frank interstitial edema. No pleural effusion or pneumothorax.  Cardiomegaly.  Left subclavian ICD.  LVAD.  Right arm PICC terminates at the cavoatrial junction  IMPRESSION: Endotracheal tube terminates 4 cm above the carina.  Additional support apparatus as above.   Electronically Signed   By: Charline Bills M.D.   On: 07/19/2013 13:46   Dg Chest Port 1 View  07/18/2013   CLINICAL DATA:  Line placement  EXAM: PORTABLE CHEST - 1 VIEW  COMPARISON:  07/17/2013  FINDINGS: LVAD remains in place with evidence of previous median sternotomy and left-sided AICD placement. Right-sided PICC line has been placed with tip over the cavoatrial junction. No pneumothorax.  IMPRESSION: Right subclavian suspect right-sided PICC line tip at cavoatrial junction without pneumothorax.   Electronically Signed   By: Christiana Pellant M.D.   On: 07/18/2013 12:47     CXR:   ASSESSMENT / PLAN:  PULMONARY A: acute respiratory failure  S/p acute right MCA CVA. Has copious airway secretions. Concerned about element of edema. Pulmonary exam had been unremarkable prior.  P:   Full vent  support Sedation w/ diprivan to allow for neuro-exam  Start SBT's, may not be ready to assess for extubation yet  CARDIOVASCULAR A:  NSTEMI (felt to be Pump thrombosis) Acute on Chronic systolic HF (EF 10-15%) w/ LVAD placed in march 2013 at [facility].  Cardiogenic shock  P  LVAD per cards levophed for MAP >70  Asa, plavix and heparin per cards   RENAL A:  AKI in setting of hypoperfusion, s/p contrast load for cerebral arteriogram.  P:   Avoid hypovolemia MAP goals >70 F/u chemistry  Renal dose meds   GASTROINTESTINAL A:   Rising LFTs in setting of on-going ischemia  At risk for dysphagia   P:   PPI for PUD  Start TFs Will need swallow eval after off vent   HEMATOLOGIC A:   Chronic anticoagulation (on coumadin for LVAD) Concern about acute pump related embolic events P:  Heparin per pharmacy   INFECTIOUS A:  ? aspiration pneumonia P:   F/u cxr, Culture sputum if infiltrate  Cycle PCT if spikes fever On unasyn, ? D/c soon if no clinical PNA  ENDOCRINE A:  Mild hyperglycemia  P:   ssi if glucose > 150   NEUROLOGIC A:  Right MCA embolic CVA. Not amendable to IR  P:   Propofol gtt to enable neuro checks , contin prn fentanyl for pain recs per neuro   TODAY'S SUMMARY: SBTs start 1/28.   I have personally obtained a history, examined the patient, evaluated laboratory and imaging results, formulated the assessment and plan and placed orders.  CRITICAL CARE: The patient is critically ill with multiple organ systems failure and requires high complexity decision making for assessment and support, frequent evaluation and titration of therapies, application of advanced monitoring technologies and extensive interpretation of multiple databases. Critical Care Time devoted to patient care services described in this note is 40 minutes.    Levy Pupa, MD, PhD 07/21/2013, 9:32 AM [facility] [phone] or if no answer [phone]

## 2013-07-20 NOTE — Progress Notes (Signed)
ANTICOAGULATION CONSULT NOTE - Follow Up Consult  Pharmacy Consult for heparin Indication: LVAD thrombosis/CVA  Allergies  Allergen Reactions  . Ace Inhibitors Cough  . Lexapro [Escitalopram Oxalate] Other (See Comments)    somnolence    Patient Measurements: Height: 5\' 4"  (162.6 cm) Weight: 178 lb 9.2 oz (81 kg) IBW/kg (Calculated) : 59.2   Vital Signs: Temp: 98.3 F (36.8 C) (01/27 0817) Temp src: Oral (01/27 0817) BP: 108/95 mmHg (01/27 0845) Pulse Rate: 107 (01/27 0845)  Labs:  Recent Labs  07/17/13 1525  07/17/13 2017  07/18/13 0149  07/18/13 1930  07/19/13 0500 07/19/13 0533 07/19/13 0548 07/19/13 1220 07/20/13 0020 07/20/13 0435 07/20/13 0800  HGB  --   --   --   --  16.5  < > 14.4  --   --   --  15.1  --   --  14.4  --   HCT  --   --   --   --  48.0  < > 43.0  --   --   --  44.6  --   --  42.0  --   PLT  --   --   --   --  263  < > 231  --   --   --  199  --   --  218  --   LABPROT  --   --   --   < > 34.4*  --  44.9*  --   --  21.4*  --  17.4*  --   --  19.0*  INR  --   --   --   < > 3.58*  --  4.97*  --   --  1.92*  --  1.47  --   --  1.64*  HEPARINUNFRC  --   < > 0.10*  --  0.36  0.39  --  >2.20*  < >  --  <0.10*  --  <0.10* 0.11*  --  0.33  CREATININE  --   --   --   --  1.40*  --   --   --  1.28  --   --   --   --   --   --   TROPONINI 17.02*  --  19.35*  --   --   --   --   --   --   --   --   --   --   --   --   < > = values in this interval not displayed.  Estimated Creatinine Clearance: 66.3 ml/min (by C-G formula based on Cr of 1.28).  . sodium chloride 20 mL/hr at 07/20/13 0800  . heparin 1,100 Units/hr (07/20/13 0600)  . nitroGLYCERIN Stopped (07/17/13 1800)  . norepinephrine (LEVOPHED) Adult infusion 12 mcg/min (07/20/13 0751)  . propofol 41.929 mcg/kg/min (07/20/13 0750)    Assessment: 53 yo male with LVAD, suspected pump thomobosis, admitted for NSTEMI, then developed acute CVA prior to cath. Coumadin on hold due to NPO, and IV  heparin started last night after sheath out from attempted IR procedure.  Heparin level is at goal this AM at 0.33.  No bleeding noted per chart notes.  H/H trending down, but pltc fairly stable.  Goal of Therapy:  Heparin level 0.3-0.7 units/ml Monitor platelets by anticoagulation protocol: Yes  Plan:  1. Continue IV heparin at current rate. 2. Recheck heparin level in 6 hrs to confirm. 3. Continue daily heparin level and CBC. 4.  Will f/u long term plans for anticoagulation.  Tad MooreJessica Avalene Sealy, Pharm D, BCPS  Clinical Pharmacist Pager [phone]  07/20/2013 9:37 AM

## 2013-07-20 NOTE — Clinical Documentation Improvement (Signed)
Possible Clinical Conditions?   Encephalopathy (describe type if known)                       Anoxic                       Septic                       Alcoholic                        Hepatic                       Hypertensive                       Metabolic                       Toxic Traumatic brain injury Traumatic brain hemorrhage Other Condition Cannot Clinically Determine    Risk Factors: Patient with AMS per 01/26 progress notes.   Thank You, Marciano Sequin, Clinical Documentation Specialist:  [phone]  [facility]- Health Information Management

## 2013-07-20 NOTE — Progress Notes (Signed)
INITIAL NUTRITION ASSESSMENT  DOCUMENTATION CODES Per approved criteria  -Obesity Unspecified   INTERVENTION: Initiate Vital High Protein formula at 30 ml/hr Once Propofol discontinued, increase to goal rate of 40 ml/hr with Prostat liquid protein 30 ml TID via tube to provide 1260 kcals (67% of estimated kcal needs), 129 gm protein (100% of estimated protein needs), 803 ml of free water  RD to follow for nutrition care plan  NUTRITION DIAGNOSIS: Inadequate oral intake related to inability to eat as evidenced by NPO status  Goal: Enteral nutrition to provide 60-70% of estimated calorie needs (22-25 kcals/kg ideal body weight) and 100% of estimated protein needs, based on ASPEN guidelines for permissive underfeeding in critically ill obese individuals  Monitor:  EN regimen & tolerance, respiratory status, weight, labs, I/O's  Reason for Assessment: Consult, Low Braden  53 y.o. male  Admitting Dx: NSTEMI  ASSESSMENT: Patient with PMH of severe CHF, mitral regurgitation and HTN; s/p LVAD placement at [facility] March 2013, as bridge to transplant; admitted for evaluation of new NSTEMI possibly related to pump thrombosis.  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  Patient s/p procedure 1/26: CEREBRAL ANGIOGRAM  Patient is currently intubated on ventilator support -- OGT in place MV: 7.8 L/min Temp (24hrs), Avg:98.9 F (37.2 C), Min:98.1 F (36.7 C), Max:100.3 F (37.9 C)  Propofol: 20 ml/hr --- 528 fat kcals   RD consulted for EN initiation & management via Adult Tube Feeding Protocol.  Height: Ht Readings from Last 1 Encounters:  07/17/13 5\' 4"  (1.626 m)    Weight: Wt Readings from Last 1 Encounters:  07/20/13 178 lb 9.2 oz (81 kg)    Ideal Body Weight: 130 lb  % Ideal Body Weight: 137%  Wt Readings from Last 10 Encounters:  07/20/13 178 lb 9.2 oz (81 kg)  07/20/13 178 lb 9.2 oz (81 kg)  07/20/13 178 lb  9.2 oz (81 kg)  05/06/13 179 lb 8 oz (81.421 kg)  04/29/13 188 lb (85.276 kg)  03/30/13 184 lb 1.9 oz (83.516 kg)  10/14/12 184 lb 8 oz (83.689 kg)  09/14/12 187 lb 4 oz (84.936 kg)  08/11/12 177 lb (80.287 kg)  08/08/12 160 lb 1.6 oz (72.621 kg)    Usual Body Weight: 179 lb  % Usual Body Weight: 99%  BMI:  Body mass index is 30.64 kg/(m^2).  Estimated Nutritional Needs: Kcal: 1876 Protein: 120-130 gm Fluid: per MD  Skin: groin puncture site  Diet Order: NPO  EDUCATION NEEDS: -No education needs identified at this time   Intake/Output Summary (Last 24 hours) at 07/20/13 1205 Last data filed at 07/20/13 1100  Gross per 24 hour  Intake 3839.38 ml  Output   1625 ml  Net 2214.38 ml    Labs:   Recent Labs Lab 07/18/13 0149 07/19/13 0500 07/20/13 1030  NA 135* 136* 138  K 4.8 3.8 3.7  CL 97 95* 102  CO2 20 26 23   BUN 16 12 9   CREATININE 1.40* 1.28 1.10  CALCIUM 8.7 8.8 8.4  MG 1.9  --   --   GLUCOSE 184* 139* 147*    CBG (last 3)   Recent Labs  07/19/13 1017  GLUCAP 125*    Scheduled Meds: . allopurinol  100 mg Oral Daily  . antiseptic oral rinse  15 mL Mouth Rinse QID  . aspirin  300 mg Rectal Daily  . chlorhexidine  15 mL Mouth Rinse BID  . feeding supplement (  VITAL HIGH PROTEIN)  1,000 mL Per Tube Q24H  . fentaNYL  100 mcg Intravenous Once  . magnesium oxide  400 mg Oral Daily  . midazolam  2 mg Intravenous Once  . pantoprazole sodium  40 mg Per Tube Q1200  . piperacillin-tazobactam (ZOSYN)  IV  3.375 g Intravenous Q8H  . sodium chloride  10-40 mL Intracatheter Q12H    Continuous Infusions: . sodium chloride 20 mL/hr at 07/20/13 0800  . amiodarone (NEXTERONE PREMIX) 360 mg/200 mL dextrose 60 mg/hr (07/20/13 1133)   Followed by  . amiodarone (NEXTERONE PREMIX) 360 mg/200 mL dextrose    . heparin 1,100 Units/hr (07/20/13 0600)  . nitroGLYCERIN Stopped (07/17/13 1800)  . norepinephrine (LEVOPHED) Adult infusion 12 mcg/min (07/20/13 0751)   . propofol 41.929 mcg/kg/min (07/20/13 0750)    Past Medical History  Diagnosis Date  . CHF (congestive heart failure)     EF- 10-15  . Medically noncompliant   . Mitral regurgitation   . Tobacco user   . HTN (hypertension)   . AICD (automatic cardioverter/defibrillator) present   . GERD (gastroesophageal reflux disease)   . Substance abuse   . Chronic renal insufficiency   . Syncope   . Thrombus 08/06/2010  . SYSTOLIC HEART FAILURE, CHRONIC 09/22/2008    Qualifier: Diagnosis of  By: [name], MD, Trixie DredgeFACC, Daniel R   . LV (left ventricular) mural thrombus 01/28/2011  . ICD - IN SITU 09/16/2008    Qualifier: Diagnosis of  By: Wonda AmisBednar, NP-C, Michelle    . MITRAL STENOSIS/ INSUFFICIENCY, NON-RHEUMATIC 09/22/2008    Qualifier: Diagnosis of  By: [name], MD, Trixie DredgeFACC, Daniel R   . Hepatomegaly 09/16/2008    Qualifier: Diagnosis of  By: Wonda AmisBednar, NP-C, Michelle    . High cholesterol 02/26/2012    "at one time"  . Sleep apnea   . Exertional dyspnea 02/26/2012  . History of blood transfusion 08/2011    "when I had heart pump"  . Migraines   . COMMON MIGRAINE 06/14/2009    Qualifier: Diagnosis of  By: Jonny RuizJohn MD, Len BlalockJames W   . History of gout 02/26/2012  . Depression   . Bipolar affective disorder 10/22/2011    pt denies this hx 02/26/2012    Past Surgical History  Procedure Laterality Date  . Cardiac defibrillator placement  ~ 2008  . Left ventricular assist device  08/2011  . Radiology with anesthesia N/A 07/19/2013    Procedure: RADIOLOGY WITH ANESTHESIA;  Surgeon: Oneal GroutSanjeev K Deveshwar, MD;  Location: [facility] OR;  Service: Radiology;  Laterality: N/A;    Maureen ChattersKatie Hanson Medeiros, RD, LDN Pager #: [phone] After-Hours Pager #: [phone]

## 2013-07-20 NOTE — Progress Notes (Signed)
Patient ID: Eric LovelyUndra [name], male   DOB: 08/21/1960, 53 y.o.   MRN: [id number] Advanced Heart Failure Rounding Note  [name] is 53 y/o male with a history of severe CHF due to NICM EF 10-15%, and severe MR s/p HM II LVAD [facility] at [facility] March 2013. His medical history also consists of CRI, NSVT s/p ICD and LV thrombus.   Admitted 1/24 with NSTEMI felt secondary due embolic phenomenon due to pump thrombosis.  LDH high around 900.   On way to cath lab 1/26 developed acute onset of aphasia and left-sided hemiparesis. CT negative for bleed. Taken emergently to neuro-interventional suite but no amenable lesions. Remains intubated on levophed. Remains on propofol gtt, unable to assess neuro status.  Several low flows last night. Given NS 250cc and levophed continued. Having heavy secretions. CXR without edema. Back on heparin.   Co-ox 83%. LDH 1693. INR 1.64  Repeat head CT this: 1. New hypodensities in the left basal ganglia and left thalamus,  concerning for evolving acute infarcts. Smaller foci of  hypoattenuation in the right caudate and thalamus may also represent  acute ischemia.  2. New paranasal sinus mucosal disease.   VAD interrogated personally.  Flow 3.7, Speed 9400 PI 2.8,  Power 5.0.  Events:  40 PI events  Alarms: low flows s 1.   CVP 14 - 15  MAP's:  90 - 100  Filed Vitals:   07/20/13 0815 07/20/13 0817 07/20/13 0830 07/20/13 0845  BP:    108/95  Pulse: 105   107  Temp:  98.3 F (36.8 C)    TempSrc:  Oral    Resp: 14  14 14   Height:      Weight:      SpO2: 98%   100%    Intake/Output Summary (Last 24 hours) at 07/20/13 0924 Last data filed at 07/20/13 0834  Gross per 24 hour  Intake 3625.88 ml  Output   1580 ml  Net 2045.88 ml    LABS: Basic Metabolic Panel:  Recent Labs  78/29/5601/25/15 0149 07/19/13 0500  NA 135* 136*  K 4.8 3.8  CL 97 95*  CO2 20 26  GLUCOSE 184* 139*  BUN 16 12  CREATININE 1.40* 1.28  CALCIUM 8.7 8.8  MG 1.9  --    Liver Function  Tests:  Recent Labs  07/18/13 0149 07/19/13 0500  AST 223* 471*  ALT 43 80*  ALKPHOS 98 85  BILITOT 0.4 1.8*  PROT 8.4* 7.6  ALBUMIN 3.9 3.6   No results found for this basename: LIPASE, AMYLASE,  in the last 72 hours CBC:  Recent Labs  07/18/13 0149  07/19/13 0548 07/20/13 0435  WBC 7.7  < > 7.9 9.2  NEUTROABS 6.1  --   --   --   HGB 16.5  < > 15.1 14.4  HCT 48.0  < > 44.6 42.0  MCV 88.6  < > 88.1 88.8  PLT 263  < > 199 218  < > = values in this interval not displayed. Cardiac Enzymes:  Recent Labs  07/17/13 1525 07/17/13 2017  TROPONINI 17.02* 19.35*   07/19/13:  INR 1.9>1.47   LDH 213>086>[id number]>1568>1693  Co-ox 55.3>64.4>83  RADIOLOGY: Dg Chest Portable 1 View  07/17/2013   CLINICAL DATA:  Left-sided chest pain for 3 days, weakness and dizziness.  EXAM: PORTABLE CHEST - 1 VIEW  COMPARISON:  08/03/2012  FINDINGS: Evidence of median sternotomy noted with LVAD in place. Left-sided defibrillator noted. Mild prominence of the cardiac silhouette  persists. Tricuspid valvuloplasty reidentified. Trace left pleural fluid or thickening persists. No new pulmonary opacity.  IMPRESSION: No new acute abnormality. Stable appearance of trace left pleural fluid or thickening.   Electronically Signed   By: Christiana Pellant M.D.   On: 07/17/2013 10:12    PHYSICAL EXAM General: Intubated sedated Neck: JVP to jaw Lungs: Clear to auscultation bilaterally with normal respiratory effort. CV: LVAD pump hum normal.  No peripheral edema.   Abdomen: Soft, nontender, no hepatosplenomegaly, no distention.  Neurologic: Intubated/sedated. Expressive aphasia prior to intubation. L sided paralysis Extremities: No clubbing or cyanosis.   TELEMETRY: Reviewed telemetry pt in sinus tachy in 100s   Assessment:   1. Acute embolic CVA 2. NSTEMI - Likely pump thrombosis with coronary embolus.  2. Chronic systolic HF s/p HM II VAD  3. NICM EF 15%  4. Hyperkalemia  5. HTN - MAP 70s currently    Plan/Discussion:    He has had acute CVA due to pump thrombosis/embolism. Remains intubated and sedated for airway protection. CCM has seen today and will not extubate due to secretions. CXR without overt edema but will try to diurese slowly as tolerated but he has not tolerated this well to date. Will perform TEE to further evaluate inflow cannula for clot and confirm adequate LV unloading as seen on TTE>   Repeat CT this am without bleed. Continue heparin and ASA 325 PR. Holding plavix for now. I worry he will have severe residual deficits.  Remains on levophed for support. Given acute CVA will manage NSTEMI medically. No b-blocker due to shock.   Prognosis is guarded at this point. If he recovers will need to consider resuming evaluation for pump exchange. Could  The patient is critically ill with multiple organ systems failure and requires high complexity decision making for assessment and support, frequent evaluation and titration of therapies, application of advanced monitoring technologies and extensive interpretation of multiple databases.   Critical Care Time devoted to patient care services described in this note is 35 Minutes.    Arvilla Meres MD 07/20/2013 9:24 AM

## 2013-07-20 NOTE — Progress Notes (Signed)
ANTICOAGULATION CONSULT NOTE - Follow Up Consult  Pharmacy Consult for heparin Indication: LVAD thrombosis/CVA  Allergies  Allergen Reactions  . Ace Inhibitors Cough  . Lexapro [Escitalopram Oxalate] Other (See Comments)    somnolence   Patient Measurements: Height: 5\' 4"  (162.6 cm) Weight: 178 lb 9.2 oz (81 kg) IBW/kg (Calculated) : 59.2  Vital Signs: Temp: 97.5 F (36.4 C) (01/27 1602) Temp src: Oral (01/27 1602) BP: 108/95 mmHg (01/27 0845) Pulse Rate: 85 (01/27 1705)  Labs:  Recent Labs  07/17/13 2017  07/18/13 0149  07/18/13 1930  07/19/13 0500 07/19/13 0533 07/19/13 0548 07/19/13 1220 07/20/13 0020 07/20/13 0435 07/20/13 0800 07/20/13 1030 07/20/13 1745  HGB  --   --  16.5  < > 14.4  --   --   --  15.1  --   --  14.4  --   --   --   HCT  --   --  48.0  < > 43.0  --   --   --  44.6  --   --  42.0  --   --   --   PLT  --   --  263  < > 231  --   --   --  199  --   --  218  --   --   --   LABPROT  --   < > 34.4*  --  44.9*  --   --  21.4*  --  17.4*  --   --  19.0*  --   --   INR  --   < > 3.58*  --  4.97*  --   --  1.92*  --  1.47  --   --  1.64*  --   --   HEPARINUNFRC 0.10*  --  0.36  0.39  --  >2.20*  < >  --  <0.10*  --  <0.10* 0.11*  --  0.33  --  0.37  CREATININE  --   --  1.40*  --   --   --  1.28  --   --   --   --   --   --  1.10  --   TROPONINI 19.35*  --   --   --   --   --   --   --   --   --   --   --   --   --   --   < > = values in this interval not displayed.  Estimated Creatinine Clearance: 77.2 ml/min (by C-G formula based on Cr of 1.1).  . sodium chloride 20 mL/hr at 07/20/13 0800  . amiodarone (NEXTERONE PREMIX) 360 mg/200 mL dextrose 30 mg/hr (07/20/13 1745)  . heparin 1,100 Units/hr (07/20/13 0600)  . nitroGLYCERIN Stopped (07/17/13 1800)  . norepinephrine (LEVOPHED) Adult infusion 15 mcg/min (07/20/13 1625)  . propofol 41.929 mcg/kg/min (07/20/13 0750)   Assessment: 53 yo male with LVAD, suspected pump thomobosis, admitted for  NSTEMI, then developed acute CVA prior to cath. Coumadin on hold due to NPO, and IV heparin started last night after sheath out from attempted IR procedure.  Heparin level remains within desired goal range at 0.37 without noted bleeding complications.  Goal of Therapy:  Heparin level 0.3-0.7 units/ml Monitor platelets by anticoagulation protocol: Yes  Plan:  1. Will continue IV heparin at current rate of 1100 units/hr. 2. Continue daily heparin level and CBC. 3. Will f/u long term plans for anticoagulation.  Nadara MustardNita Darolyn Double, PharmD., MS Clinical Pharmacist Pager:  [phone] Thank you for allowing pharmacy to be part of this patients care team.  07/20/2013 6:57 PM

## 2013-07-20 NOTE — Progress Notes (Signed)
Stroke Team Progress Note  HISTORY [name] is a 53 y.o. male left-handed male who presents with sudden onset left-sided weakness and aphasia. He was admitted 07/17/2013 with chest pain. He has a history of congestive heart failure and has a left ventricular assist device in place. His INR was low at home - his pump got clotted (likely causing MI), and he loaded himself and became supratherapeutic 4.97 in hospital. He was given vitamin K yesterday 07/18/2013, his INR came down to 1.9. He was then  started on heparin. Around 7:40 AM 07/19/2013, the nurse helped him to get dressed and stated that he seemed sleepy, he was moving his left side well. The patient endorses that he had symptoms start after he woke up this morning, but woke up in his normal state. He appears to understand well. Patient was not administerd TPA secondary to being on systemic heparin and INR 1.9  SUBJECTIVE His student RN is at the bedside.  No family at bedside.   OBJECTIVE Most recent Vital Signs: Filed Vitals:   07/20/13 0815 07/20/13 0817 07/20/13 0830 07/20/13 0845  BP:    108/95  Pulse: 105   107  Temp:  98.3 F (36.8 C)    TempSrc:  Oral    Resp: 14  14 14   Height:      Weight:      SpO2: 98%   100%   CBG (last 3)   Recent Labs  07/19/13 1017  GLUCAP 125*    IV Fluid Intake:   . sodium chloride 20 mL/hr at 07/20/13 0800  . heparin 1,100 Units/hr (07/20/13 0600)  . nitroGLYCERIN Stopped (07/17/13 1800)  . norepinephrine (LEVOPHED) Adult infusion 12 mcg/min (07/20/13 0751)  . propofol 41.929 mcg/kg/min (07/20/13 0750)    MEDICATIONS  . allopurinol  100 mg Oral Daily  . alteplase  1-30 mg Intra-arterial to XRAY  . antiseptic oral rinse  15 mL Mouth Rinse QID  . aspirin  300 mg Rectal Daily  . chlorhexidine  15 mL Mouth Rinse BID  . feeding supplement (VITAL HIGH PROTEIN)  1,000 mL Per Tube Q24H  . fentaNYL  100 mcg Intravenous Once  . magnesium oxide  400 mg Oral Daily  . midazolam  2 mg  Intravenous Once  . pantoprazole sodium  40 mg Per Tube Q1200  . sodium chloride  10-40 mL Intracatheter Q12H   PRN:  acetaminophen, fentaNYL, morphine injection, nitroGLYCERIN, ondansetron (ZOFRAN) IV  Diet:  NPO  Activity:  Bedrest DVT Prophylaxis:  IV heparin  CLINICALLY SIGNIFICANT STUDIES Basic Metabolic Panel:   Recent Labs Lab 07/18/13 0149 07/19/13 0500  NA 135* 136*  K 4.8 3.8  CL 97 95*  CO2 20 26  GLUCOSE 184* 139*  BUN 16 12  CREATININE 1.40* 1.28  CALCIUM 8.7 8.8  MG 1.9  --    Liver Function Tests:   Recent Labs Lab 07/18/13 0149 07/19/13 0500  AST 223* 471*  ALT 43 80*  ALKPHOS 98 85  BILITOT 0.4 1.8*  PROT 8.4* 7.6  ALBUMIN 3.9 3.6   CBC:  Recent Labs Lab 07/17/13 0923 07/18/13 0149  07/19/13 0548 07/20/13 0435  WBC 5.7 7.7  < > 7.9 9.2  NEUTROABS 3.8 6.1  --   --   --   HGB 17.3* 16.5  < > 15.1 14.4  HCT 49.5 48.0  < > 44.6 42.0  MCV 87.9 88.6  < > 88.1 88.8  PLT 269 263  < > 199 218  < > =  values in this interval not displayed. Coagulation:   Recent Labs Lab 07/18/13 1930 07/19/13 0533 07/19/13 1220 07/20/13 0800  LABPROT 44.9* 21.4* 17.4* 19.0*  INR 4.97* 1.92* 1.47 1.64*   Cardiac Enzymes:   Recent Labs Lab 07/17/13 0923 07/17/13 1525 07/17/13 2017  TROPONINI 12.76* 17.02* 19.35*   Urinalysis: No results found for this basename: COLORURINE, APPERANCEUR, LABSPEC, PHURINE, GLUCOSEU, HGBUR, BILIRUBINUR, KETONESUR, PROTEINUR, UROBILINOGEN, NITRITE, LEUKOCYTESUR,  in the last 168 hours Lipid Panel    Component Value Date/Time   CHOL 166 07/20/2013 0020   TRIG 96 07/20/2013 0020   HDL 63 07/20/2013 0020   CHOLHDL 2.6 07/20/2013 0020   VLDL 19 07/20/2013 0020   LDLCALC 84 07/20/2013 0020   HgbA1C  Lab Results  Component Value Date   HGBA1C 5.7* 07/19/2013    Urine Drug Screen:     Component Value Date/Time   LABOPIA NONE DETECTED 06/21/2008 1300   COCAINSCRNUR NONE DETECTED 06/21/2008 1300   LABBENZ NONE DETECTED  06/21/2008 1300   AMPHETMU NONE DETECTED 06/21/2008 1300   THCU NONE DETECTED 06/21/2008 1300   LABBARB  Value: NONE DETECTED        DRUG SCREEN FOR MEDICAL PURPOSES ONLY.  IF CONFIRMATION IS NEEDED FOR ANY PURPOSE, NOTIFY LAB WITHIN 5 DAYS. 06/21/2008 1300    Alcohol Level: No results found for this basename: ETH,  in the last 168 hours   CT of the brain  07/19/2013    1. No acute intracranial process. 2. Very minimal amount of geographic subcortical encephalomalacia within in the anterior lateral aspect of the left temporal lobe, at the site of the patient's prior hemorrhagic contusion.   Cerebral Angiogram 07/20/2013 Rt CFa approach. Distal small filling defects in the the orbitofrontal br of RT MCA sup division,and tertiary angular division of RT MCA  MRI of the brain    MRA of the brain    2D Echocardiogram  EF 10%. Diffuse kypokinesis  Carotid Doppler    TEE   CXR   07/19/2013    Nasogastric tube extends into the stomach.    07/19/2013    Endotracheal tube terminates 4 cm above the carina.  Additional support apparatus as above.  07/18/2013  Right subclavian suspect right-sided PICC line tip at cavoatrial junction without pneumothorax.     EKG  sinus tachycardia. For complete results please see formal report.   Therapy Recommendations   Physical Exam   Young african [place] male sedated and intubated. . Afebrile. Head is nontraumatic. Neck is supple without bruit.. Cardiac exam no murmur or gallop. Lungs are clear to auscultation. Distal pulses are well felt. LVAD right side abdomen Neurological Exam : limited as patient sedated on propofol drip as he gets tachcyardic of it. Pupils 3 mm sluggishly reactive. Doll's eye movements are sluggish. No facial grimacing to pain. No spontaneous extremity movements. Moves left side greater than right to painful stimuli. Left plantar is equivocal right is upgoing.  ASSESSMENT [name] is a 53 y.o. male presenting with left sided  weakness.  Imaging supports left larger than right subcortical  infarcts. Infarct felt to be embolic secondary to either LVAD clot vs acute MI.  On aspirin 81 mg orally every day, clopidogrel 75 mg orally every day and warfarin prior to admission. Now on aspirin 81 mg orally every day, clopidogrel 75 mg orally every day and heparin for secondary stroke prevention. Patient with resultant left hemiparesis, dysphagia, VDRF.   Systolic HF, EF 10%, has LVAD hypertension Chronic  RI Hyperlipidemia, LDL 84, on no statin PTA, now on no statin, at goal LDL < 100  obstructive sleep apnea Hx migraines Bipolar affective disorder  Hospital day # 3  TREATMENT/PLAN  Continue aspirin 81 mg orally every day, clopidogrel 75 mg orally every day and heparin for secondary stroke prevention.  Pt for TEE today by Dr. Gala Romney This patient is critically ill and at significant risk of neurological worsening, death and care requires constant monitoring of vital signs, hemodynamics,respiratory and cardiac monitoring,review of multiple databases, neurological assessment, discussion with family, other specialists and medical decision making of high complexity. I spent 30 minutes of neurocritical care time  in the care of  this patient. Annie Main, MSN, RN, ANVP-BC, ANP-BC, [facility] Pager: [phone] 07/20/2013 10:05 AM  I have personally obtained a history, examined the patient, evaluated imaging results, and formulated the assessment and plan of care. I agree with the above. Delia Heady, MD

## 2013-07-20 NOTE — Progress Notes (Signed)
ANTIBIOTIC CONSULT NOTE - INITIAL  Pharmacy Consult for Zosyn (d/c Unasyn due to high sodium load) Indication: rule out pneumonia  Allergies  Allergen Reactions  . Ace Inhibitors Cough  . Lexapro [Escitalopram Oxalate] Other (See Comments)    somnolence    Patient Measurements: Height: 5\' 4"  (162.6 cm) Weight: 178 lb 9.2 oz (81 kg) IBW/kg (Calculated) : 59.2 Adjusted Body Weight: n/a  Vital Signs: Temp: 98.3 F (36.8 C) (01/27 0817) Temp src: Oral (01/27 0817) BP: 108/95 mmHg (01/27 0845) Pulse Rate: 25 (01/27 1115) Intake/Output from previous day: 01/26 0701 - 01/27 0700 In: 3668.9 [I.V.:3668.9] Out: 1380 [Urine:1380] Intake/Output from this shift: Total I/O In: 444 [I.V.:394; IV Piggyback:50] Out: 375 [Urine:375]  Labs:  Recent Labs  07/18/13 0149  07/18/13 1930 07/19/13 0500 07/19/13 0548 07/20/13 0435 07/20/13 1030  WBC 7.7  < > 6.7  --  7.9 9.2  --   HGB 16.5  < > 14.4  --  15.1 14.4  --   PLT 263  < > 231  --  199 218  --   CREATININE 1.40*  --   --  1.28  --   --  1.10  < > = values in this interval not displayed. Estimated Creatinine Clearance: 77.2 ml/min (by C-G formula based on Cr of 1.1). No results found for this basename: VANCOTROUGH, Leodis BinetVANCOPEAK, VANCORANDOM, GENTTROUGH, GENTPEAK, GENTRANDOM, TOBRATROUGH, TOBRAPEAK, TOBRARND, AMIKACINPEAK, AMIKACINTROU, AMIKACIN,  in the last 72 hours   Microbiology: Recent Results (from the past 720 hour(s))  MRSA PCR SCREENING     Status: None   Collection Time    07/17/13  3:29 PM      Result Value Range Status   MRSA by PCR NEGATIVE  NEGATIVE Final   Comment:            The GeneXpert MRSA Assay (FDA     approved for NASAL specimens     only), is one component of a     comprehensive MRSA colonization     surveillance program. It is not     intended to diagnose MRSA     infection nor to guide or     monitor treatment for     MRSA infections.    Medical History: Past Medical History  Diagnosis Date   . CHF (congestive heart failure)     EF- 10-15  . Medically noncompliant   . Mitral regurgitation   . Tobacco user   . HTN (hypertension)   . AICD (automatic cardioverter/defibrillator) present   . GERD (gastroesophageal reflux disease)   . Substance abuse   . Chronic renal insufficiency   . Syncope   . Thrombus 08/06/2010  . SYSTOLIC HEART FAILURE, CHRONIC 09/22/2008    Qualifier: Diagnosis of  By: [name], MD, Trixie DredgeFACC, Daniel R   . LV (left ventricular) mural thrombus 01/28/2011  . ICD - IN SITU 09/16/2008    Qualifier: Diagnosis of  By: Wonda AmisBednar, NP-C, Michelle    . MITRAL STENOSIS/ INSUFFICIENCY, NON-RHEUMATIC 09/22/2008    Qualifier: Diagnosis of  By: [name], MD, Trixie DredgeFACC, Daniel R   . Hepatomegaly 09/16/2008    Qualifier: Diagnosis of  By: Wonda AmisBednar, NP-C, Michelle    . High cholesterol 02/26/2012    "at one time"  . Sleep apnea   . Exertional dyspnea 02/26/2012  . History of blood transfusion 08/2011    "when I had heart pump"  . Migraines   . COMMON MIGRAINE 06/14/2009    Qualifier: Diagnosis of  By: Jonny RuizJohn  MD, Len Blalock   . History of gout 02/26/2012  . Depression   . Bipolar affective disorder 10/22/2011    pt denies this hx 02/26/2012    Medications:  Scheduled:  . allopurinol  100 mg Oral Daily  . amiodarone  150 mg Intravenous Once  . antiseptic oral rinse  15 mL Mouth Rinse QID  . aspirin  300 mg Rectal Daily  . chlorhexidine  15 mL Mouth Rinse BID  . feeding supplement (VITAL HIGH PROTEIN)  1,000 mL Per Tube Q24H  . fentaNYL  100 mcg Intravenous Once  . magnesium oxide  400 mg Oral Daily  . midazolam  2 mg Intravenous Once  . pantoprazole sodium  40 mg Per Tube Q1200  . piperacillin-tazobactam (ZOSYN)  IV  3.375 g Intravenous Q8H  . sodium chloride  10-40 mL Intracatheter Q12H   Assessment: 53 yo male with LVAD, suspected pump thomobosis, admitted for NSTEMI, then developed acute CVA prior to cath.  Pharmacy asked to begin Unasyn today for suspected aspiration PNA, but HF team  exchanging Zosyn for Unasyn due to high sodium load from Unasyn.  Scr 1.10, est CrCl ~ 77 ml/min.  Goal of Therapy:  Resolution of infection  Plan:  1. Zosyn 3.375g IV q 8 hrs (4 hr extended interval infusion) 2. F/u clinical course and renal function.  Tad Moore, BCPS  Clinical Pharmacist Pager [phone]  07/20/2013 11:44 AM

## 2013-07-20 NOTE — Progress Notes (Signed)
Spoke with Dr. Gala Romney in reference to pt VAD number changing:  Flow 7/PI 2.4/Power 6.6.  Dr. Gala Romney advised if PI sustains <2.0 to call him back.  Will continue to monitor pt.

## 2013-07-20 NOTE — Procedures (Signed)
Intubation Procedure Note [name] [id number] 1960-07-06  Procedure: Intubation Indications: ETT needed to be changed  Procedure Details Consent: Unable to obtain consent because of emergent medical necessity. Time Out: Verified patient identification, verified procedure, site/side was marked, verified correct patient position, special equipment/implants available, medications/allergies/relevent history reviewed, required imaging and test results available.  Performed  Maximum sterile technique was used including antiseptics, gloves, hand hygiene and mask.  Tube changed over a changer.     Evaluation Hemodynamic Status: BP stable throughout; O2 sats: stable throughout Patient's Current Condition: stable Complications: No apparent complications Patient did tolerate procedure well. Chest X-ray ordered to verify placement.  CXR: pending.   [name] 07/20/2013

## 2013-07-20 NOTE — Progress Notes (Signed)
**Note De-Identified Surabhi Gadea Obfuscation** RT note: Tube exchanger used to replaced ETT due to cuff leak. Patient tolerated procedure well. ETCO2 positive, BBS clr/dim, VS WNL

## 2013-07-20 NOTE — Progress Notes (Signed)
ANTICOAGULATION CONSULT NOTE - Follow Up Consult  Pharmacy Consult for heparin Indication: LVAD thrombosis/CVA  Allergies  Allergen Reactions  . Ace Inhibitors Cough  . Lexapro [Escitalopram Oxalate] Other (See Comments)    somnolence    Patient Measurements: Height: 5\' 4"  (162.6 cm) Weight: 175 lb 4.3 oz (79.5 kg) IBW/kg (Calculated) : 59.2   Vital Signs: Temp: 99.1 F (37.3 C) (01/27 0000) Temp src: Axillary (01/27 0000) BP: 108/97 mmHg (01/27 0015) Pulse Rate: 110 (01/27 0015)  Labs:  Recent Labs  07/17/13 8841 07/17/13 1525  07/17/13 2017 07/18/13 0149 07/18/13 1545 07/18/13 1930  07/19/13 0500 07/19/13 0533 07/19/13 0548 07/19/13 1220 07/20/13 0020  HGB 17.3*  --   --   --  16.5 15.1 14.4  --   --   --  15.1  --   --   HCT 49.5  --   --   --  48.0 43.9 43.0  --   --   --  44.6  --   --   PLT 269  --   --   --  263 208 231  --   --   --  199  --   --   LABPROT 21.3*  --   --   --  34.4*  --  44.9*  --   --  21.4*  --  17.4*  --   INR 1.91*  --   --   --  3.58*  --  4.97*  --   --  1.92*  --  1.47  --   HEPARINUNFRC  --   --   < > 0.10* 0.36  0.39  --  >2.20*  < >  --  <0.10*  --  <0.10* 0.11*  CREATININE 1.46*  --   --   --  1.40*  --   --   --  1.28  --   --   --   --   TROPONINI 12.76* 17.02*  --  19.35*  --   --   --   --   --   --   --   --   --   < > = values in this interval not displayed.  Estimated Creatinine Clearance: 65.7 ml/min (by C-G formula based on Cr of 1.28).  Assessment: 53 yo male with NSTEMI/CVA for heparin  Goal of Therapy:  Heparin level 0.3-0.7 units/ml Monitor platelets by anticoagulation protocol: Yes  Plan:  Increase Heparin 1100 units/hr Check heparin level in 6 hours.  Geannie Risen, PharmD, BCPS   07/20/2013 1:15 AM

## 2013-07-21 ENCOUNTER — Inpatient Hospital Stay (HOSPITAL_COMMUNITY): Payer: Medicare Other

## 2013-07-21 DIAGNOSIS — I359 Nonrheumatic aortic valve disorder, unspecified: Secondary | ICD-10-CM

## 2013-07-21 LAB — GLUCOSE, CAPILLARY
GLUCOSE-CAPILLARY: 148 mg/dL — AB (ref 70–99)
GLUCOSE-CAPILLARY: 168 mg/dL — AB (ref 70–99)
Glucose-Capillary: 128 mg/dL — ABNORMAL HIGH (ref 70–99)
Glucose-Capillary: 133 mg/dL — ABNORMAL HIGH (ref 70–99)
Glucose-Capillary: 149 mg/dL — ABNORMAL HIGH (ref 70–99)
Glucose-Capillary: 172 mg/dL — ABNORMAL HIGH (ref 70–99)

## 2013-07-21 LAB — BASIC METABOLIC PANEL
BUN: 11 mg/dL (ref 6–23)
CALCIUM: 8.3 mg/dL — AB (ref 8.4–10.5)
CO2: 22 meq/L (ref 19–32)
CREATININE: 1.17 mg/dL (ref 0.50–1.35)
Chloride: 99 mEq/L (ref 96–112)
GFR calc Af Amer: 82 mL/min — ABNORMAL LOW (ref >90)
GFR, EST NON AFRICAN AMERICAN: 71 mL/min — AB (ref >90)
GLUCOSE: 179 mg/dL — AB (ref 70–99)
Potassium: 3.4 mEq/L — ABNORMAL LOW (ref 3.7–5.3)
Sodium: 137 mEq/L (ref 137–147)

## 2013-07-21 LAB — CBC
HEMATOCRIT: 39.5 % (ref 39.0–52.0)
HEMOGLOBIN: 13.8 g/dL (ref 13.0–17.0)
MCH: 30.5 pg (ref 26.0–34.0)
MCHC: 34.9 g/dL (ref 30.0–36.0)
MCV: 87.4 fL (ref 78.0–100.0)
Platelets: 206 10*3/uL (ref 150–400)
RBC: 4.52 MIL/uL (ref 4.22–5.81)
RDW: 15.2 % (ref 11.5–15.5)
WBC: 10.1 10*3/uL (ref 4.0–10.5)

## 2013-07-21 LAB — HEPARIN LEVEL (UNFRACTIONATED)
HEPARIN UNFRACTIONATED: 0.24 [IU]/mL — AB (ref 0.30–0.70)
Heparin Unfractionated: 0.29 IU/mL — ABNORMAL LOW (ref 0.30–0.70)
Heparin Unfractionated: 0.45 IU/mL (ref 0.30–0.70)

## 2013-07-21 LAB — CARBOXYHEMOGLOBIN
CARBOXYHEMOGLOBIN: 1.2 % (ref 0.5–1.5)
Methemoglobin: 0.9 % (ref 0.0–1.5)
O2 Saturation: 73.3 %
Total hemoglobin: 13.7 g/dL (ref 13.5–18.0)

## 2013-07-21 LAB — PROTIME-INR
INR: 1.68 — ABNORMAL HIGH (ref 0.00–1.49)
Prothrombin Time: 19.3 seconds — ABNORMAL HIGH (ref 11.6–15.2)

## 2013-07-21 LAB — LACTATE DEHYDROGENASE: LDH: 1595 U/L — AB (ref 94–250)

## 2013-07-21 LAB — PROCALCITONIN: Procalcitonin: 0.19 ng/mL

## 2013-07-21 MED ORDER — POTASSIUM CHLORIDE 20 MEQ/15ML (10%) PO LIQD
20.0000 meq | ORAL | Status: AC
  Administered 2013-07-21 (×2): 20 meq
  Filled 2013-07-21: qty 15

## 2013-07-21 MED ORDER — DEXTROSE 5 % IV SOLN
2.0000 ug/min | INTRAVENOUS | Status: DC
  Administered 2013-07-21: 15 ug/min via INTRAVENOUS
  Administered 2013-07-21: 17 ug/min via INTRAVENOUS
  Administered 2013-07-22: 10 ug/min via INTRAVENOUS
  Administered 2013-07-26: 4 ug/min via INTRAVENOUS
  Filled 2013-07-21 (×5): qty 16

## 2013-07-21 MED ORDER — INSULIN ASPART 100 UNIT/ML ~~LOC~~ SOLN
0.0000 [IU] | SUBCUTANEOUS | Status: DC
  Administered 2013-07-22 (×4): 2 [IU] via SUBCUTANEOUS
  Administered 2013-07-22: 3 [IU] via SUBCUTANEOUS
  Administered 2013-07-22 – 2013-07-23 (×3): 2 [IU] via SUBCUTANEOUS
  Administered 2013-07-23: 3 [IU] via SUBCUTANEOUS
  Administered 2013-07-26 – 2013-07-28 (×5): 2 [IU] via SUBCUTANEOUS
  Administered 2013-07-29: 3 [IU] via SUBCUTANEOUS
  Administered 2013-07-29 – 2013-08-06 (×7): 2 [IU] via SUBCUTANEOUS

## 2013-07-21 MED ORDER — POTASSIUM CHLORIDE 20 MEQ/15ML (10%) PO LIQD
ORAL | Status: AC
  Filled 2013-07-21: qty 15

## 2013-07-21 MED ORDER — HEPARIN (PORCINE) IN NACL 100-0.45 UNIT/ML-% IJ SOLN
1350.0000 [IU]/h | INTRAMUSCULAR | Status: DC
  Administered 2013-07-22: 1350 [IU]/h via INTRAVENOUS
  Filled 2013-07-21 (×2): qty 250

## 2013-07-21 NOTE — Progress Notes (Signed)
Tube repositioned at left and deep oral suctioned no breakdown at this time

## 2013-07-21 NOTE — Progress Notes (Signed)
ANTICOAGULATION CONSULT NOTE - Follow Up Consult  Pharmacy Consult for heparin Indication: LVAD thrombosis/CVA  Allergies  Allergen Reactions  . Ace Inhibitors Cough  . Lexapro [Escitalopram Oxalate] Other (See Comments)    somnolence    Patient Measurements: Height: 5\' 4"  (162.6 cm) Weight: 178 lb 9.2 oz (81 kg) IBW/kg (Calculated) : 59.2   Vital Signs: Temp: 99.4 F (37.4 C) (01/28 1949) Temp src: Oral (01/28 1949) BP: 102/52 mmHg (01/28 1655) Pulse Rate: 81 (01/28 2000)  Labs:  Recent Labs  07/19/13 0500  07/19/13 0548 07/19/13 1220  07/20/13 0435 07/20/13 0800 07/20/13 1030  07/21/13 0415 07/21/13 1025 07/21/13 2025  HGB  --   < > 15.1  --   --  14.4  --   --   --  13.8  --   --   HCT  --   --  44.6  --   --  42.0  --   --   --  39.5  --   --   PLT  --   --  199  --   --  218  --   --   --  206  --   --   LABPROT  --   < >  --  17.4*  --   --  19.0*  --   --  19.3*  --   --   INR  --   < >  --  1.47  --   --  1.64*  --   --  1.68*  --   --   HEPARINUNFRC  --   < >  --  <0.10*  < >  --  0.33  --   < > 0.24* 0.29* 0.45  CREATININE 1.28  --   --   --   --   --   --  1.10  --  1.17  --   --   < > = values in this interval not displayed.  Estimated Creatinine Clearance: 72.5 ml/min (by C-G formula based on Cr of 1.17).  . sodium chloride 20 mL/hr at 07/21/13 0400  . amiodarone (NEXTERONE PREMIX) 360 mg/200 mL dextrose 30 mg/hr (07/21/13 1329)  . heparin 1,350 Units/hr (07/21/13 1415)  . nitroGLYCERIN Stopped (07/17/13 1800)  . norepinephrine (LEVOPHED) Adult infusion 15 mcg/min (07/21/13 2000)  . 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 Coumadin on hold due to NPO, and IV heparin started last night after sheath out from attempted IR procedure.    PM HL = 0.45  Goal of Therapy:  Heparin level 0.3-0.7 units/ml Monitor platelets by  anticoagulation protocol: Yes  Plan:  1. Heparin drip at 1350 units / hr 2. Continue daily heparin level and CBC. 3.. Will f/u long term plans for anticoagulation.  Thank you. Okey Regal, PharmD [phone]   07/21/2013 8:59 PM

## 2013-07-21 NOTE — Progress Notes (Signed)
ANTICOAGULATION CONSULT NOTE  Pharmacy Consult for heparin Indication: LVAD thrombosis/CVA  Allergies  Allergen Reactions  . Ace Inhibitors Cough  . Lexapro [Escitalopram Oxalate] Other (See Comments)    somnolence   Patient Measurements: Height: 5\' 4"  (162.6 cm) Weight: 178 lb 9.2 oz (81 kg) IBW/kg (Calculated) : 59.2  Vital Signs: Temp: 99 F (37.2 C) (01/28 0400) Temp src: Oral (01/28 0400) BP: 104/54 mmHg (01/27 2017) Pulse Rate: 84 (01/28 0445)  Labs:  Recent Labs  07/19/13 0500  07/19/13 0548 07/19/13 1220  07/20/13 0435 07/20/13 0800 07/20/13 1030 07/20/13 1745 07/21/13 0415  HGB  --   --  15.1  --   --  14.4  --   --   --  13.8  HCT  --   --  44.6  --   --  42.0  --   --   --  39.5  PLT  --   --  199  --   --  218  --   --   --  206  LABPROT  --   < >  --  17.4*  --   --  19.0*  --   --  19.3*  INR  --   < >  --  1.47  --   --  1.64*  --   --  1.68*  HEPARINUNFRC  --   < >  --  <0.10*  < >  --  0.33  --  0.37 0.24*  CREATININE 1.28  --   --   --   --   --   --  1.10  --   --   < > = values in this interval not displayed.  Estimated Creatinine Clearance: 77.2 ml/min (by C-G formula based on Cr of 1.1).  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  Geannie Risen, PharmD, BCPS   07/21/2013 5:09 AM

## 2013-07-21 NOTE — Progress Notes (Signed)
Per Dr Delton Coombes ok to SBT 10/5 , 30%

## 2013-07-21 NOTE — Progress Notes (Signed)
Echocardiogram Echocardiogram Transesophageal has been performed.  [name] 07/21/2013, 1:31 PM

## 2013-07-21 NOTE — Progress Notes (Signed)
Patient ID: [name], male   DOB: 12/15/1960, 53 y.o.   MRN: [id number] Advanced Heart Failure Rounding Note  [name] is 53 y/o male with a history of severe CHF due to NICM EF 10-15%, and severe MR s/p HM II [facility]. His medical history also consists of CRI, NSVT s/p ICD and LV thrombus.   Admitted 1/24 with NSTEMI felt secondary due embolic phenomenon due to pump thrombosis.  LDH high around 900.   On way to cath lab 1/26 developed acute onset of aphasia and left-sided hemiparesis. CT negative for bleed. Taken emergently to neuro-interventional suite but no amenable lesions. Remains intubated on levophed. Remains on propofol gtt, unable to assess neuro status.  PI continues to remain low ranging 2.00-5.1, Levo 17 mcg/min. Secretions less this am. CCM attempt to waken and assess neuro status. Back on heparin.  Follows commands. Can move L foot a little. L arm plegic  Co-ox 73%. LDH 1595. INR 1.68 CV 12-15.   Repeat head CT this: 1. New hypodensities in the left basal ganglia and left thalamus,  concerning for evolving acute infarcts. Smaller foci of  hypoattenuation in the right caudate and thalamus may also represent  acute ischemia.  2. New paranasal sinus mucosal disease.   VAD interrogated personally.  Flow ---, Speed 9400 PI 1.8,  Power 4.9.  Events:  10 PI events  Alarms: none   CVP 15  MAP's:  88 - 96  Filed Vitals:   07/21/13 1100 07/21/13 1136 07/21/13 1231 07/21/13 1300  BP:   103/87   Pulse: 88  88 94  Temp:  100.6 F (38.1 C)    TempSrc:  Oral    Resp: 24  32 26  Height:      Weight:      SpO2: 100%  100% 99%    Intake/Output Summary (Last 24 hours) at 07/21/13 1318 Last data filed at 07/21/13 1300  Gross per 24 hour  Intake 3124.06 ml  Output   1510 ml  Net 1614.06 ml    LABS: Basic Metabolic Panel:  Recent Labs  52/84/1301/27/15 1030 07/21/13 0415  NA 138 137  K 3.7 3.4*  CL 102 99  CO2 23 22  GLUCOSE 147* 179*  BUN 9 11   CREATININE 1.10 1.17  CALCIUM 8.4 8.3*   Liver Function Tests:  Recent Labs  07/19/13 0500  AST 471*  ALT 80*  ALKPHOS 85  BILITOT 1.8*  PROT 7.6  ALBUMIN 3.6   No results found for this basename: LIPASE, AMYLASE,  in the last 72 hours CBC:  Recent Labs  07/20/13 0435 07/21/13 0415  WBC 9.2 10.1  HGB 14.4 13.8  HCT 42.0 39.5  MCV 88.8 87.4  PLT 218 206   Cardiac Enzymes: No results found for this basename: CKTOTAL, CKMB, CKMBINDEX, TROPONINI,  in the last 72 hours 07/19/13:  INR 1.9>1.47>1.68  LDH 976>843>1568>1693>1595  Co-ox 55.3>64.4>83>73  RADIOLOGY: Dg Chest Portable 1 View  07/17/2013   CLINICAL DATA:  Left-sided chest pain for 3 days, weakness and dizziness.  EXAM: PORTABLE CHEST - 1 VIEW  COMPARISON:  08/03/2012  FINDINGS: Evidence of median sternotomy noted with LVAD in place. Left-sided defibrillator noted. Mild prominence of the cardiac silhouette persists. Tricuspid valvuloplasty reidentified. Trace left pleural fluid or thickening persists. No new pulmonary opacity.  IMPRESSION: No new acute abnormality. Stable appearance of trace left pleural fluid or thickening.   Electronically Signed   By: Lucio EdwardGretchen  Green M.D.  On: 07/17/2013 10:12    PHYSICAL EXAM General: Intubated sedated Neck: JVP to jaw Lungs: Clear to auscultation bilaterally with normal respiratory effort. CV: LVAD pump hum normal.  No peripheral edema.   Abdomen: Soft, nontender, no hepatosplenomegaly, no distention.  Neurologic: Intubated/sedated. Expressive aphasia prior to intubation. L sided paralysis Extremities: No clubbing or cyanosis.   TELEMETRY: Reviewed telemetry pt in sinus tachy in 90-110   Assessment:   1. Acute embolic CVA 2. NSTEMI - Likely pump thrombosis with coronary embolus.  2. Chronic systolic HF s/p HM II VAD  3. NICM EF 15%  4. Hyperkalemia  5. HTN - MAP 70s currently  6. Acute respiratory failure 7. Shock - suspect mixed picture RV  failure/vasodilatory  Plan/Discussion:    He has had acute CVA due to pump thrombosis/embolism. Remains intubated and sedated for airway protection. CCM has seen today and attempting to wean vent as mental status and secretions permit.   Repeat CT 1/26 without bleed. Continue heparin and ASA 325 PR. Resume Plavix tomorrow. I worry he will have severe residual deficits.  On TEE there is no evidence of clot associated with inflow cannula. LV well unloaded. Main issue seems to be RV failure. Will likely need CT at some point to assess VAD and also exclude PE (though my suspicion for PE is relatively low).   Will continue levophed for support. Wean slowly. Given acute CVA will manage NSTEMI medically. No b-blocker due to shock.   Prognosis is guarded at this point. If he recovers will need to consider resuming evaluation for pump exchange.   The patient is critically ill with multiple organ systems failure and requires high complexity decision making for assessment and support, frequent evaluation and titration of therapies, application of advanced monitoring technologies and extensive interpretation of multiple databases.   Critical Care Time devoted to patient care services described in this note is 35 Minutes.    Arvilla Meres MD 07/21/2013 1:18 PM

## 2013-07-21 NOTE — Progress Notes (Signed)
[facility] ADULT ICU REPLACEMENT PROTOCOL FOR AM LAB REPLACEMENT ONLY  The patient does apply for the [facility] Adult ICU Electrolyte Replacment Protocol based on the criteria listed below:   1. Is GFR >/= 40 ml/min? yes  Patient's GFR today is 82 2. Is urine output >/= 0.5 ml/kg/hr for the last 6 hours? yes Patient's UOP is .63 ml/kg/hr 3. Is BUN < 60 mg/dL? yes  Patient's BUN today is 11 4. Abnormal electrolyte(s)K 3.4 5. Ordered repletion with: per protocol 6. If a panic level lab has been reported, has the CCM MD in charge been notified? yes.   Physician:  Dr Feliz Beam 07/21/2013 6:04 AM

## 2013-07-21 NOTE — Progress Notes (Signed)
eLink Physician-Brief Progress Note Patient Name: [name] DOB: 07-31-1960 MRN: [id number]  Date of Service  07/21/2013   HPI/Events of Note   Hyperglycemia on tube feeding  eICU Interventions   SSI q4h   Intervention Category Intermediate Interventions: Hyperglycemia - evaluation and treatment  MCQUAID, DOUGLAS 07/21/2013, 11:50 PM

## 2013-07-21 NOTE — Progress Notes (Signed)
    TRANSESOPHAGEAL ECHOCARDIOGRAM   NAME:  [name]   MRN: [id number] DOB:  03-Dec-1960   ADMIT DATE: 07/17/2013  INDICATIONS: LVAD pump thrombosis   PROCEDURE:   Informed consent was obtained prior to the procedure. The risks, benefits and alternatives for the procedure were discussed with patient's mother (patient intubated).  Risks include, but are not limited to, cough, sore throat, vomiting, nausea, somnolence, esophageal and stomach trauma or perforation, bleeding, low blood pressure, aspiration, pneumonia, infection, trauma to the teeth and death.    After a procedural time-out, the patient was given 2  mg versed and 40 mg propofol mcg fentanyl for moderate sedation.   The transesophageal probe was inserted in the esophagus and stomach without difficulty and multiple views were obtained.    COMPLICATIONS:    There were no immediate complications.  FINDINGS:  LEFT VENTRICLE: Severe global HK. EF =  15-20%%. LV is small and decompressed. At 9400 LVIDd 4.4cm at 9800 LVIDd 4.3 cm. LV cannula appears to be angulated somewhat toward septum. Good flow through cannula. No obvious clot.   RIGHT VENTRICLE: Dilated severely hypokinetic.   LEFT ATRIUM: Normal  LEFT ATRIAL APPENDAGE: No obvious clot  RIGHT ATRIUM: Markedly dilated. + heavy smoke  AORTIC VALVE:  Trileaflet. Does not open. Mild AI. Aortic root normal size. LVAD outflow flow looks normal.   MITRAL VALVE:    Normal. Moderate bileaflet prolapse. Mild MR  TRICUSPID VALVE: Normal. Mild TR Estimated RVSP 25  PULMONIC VALVE: Not well visualized  INTERATRIAL SEPTUM: Bows R to L. No obvious ASD.   PERICARDIUM: No effusion  DESCENDING AORTA: Not visualized

## 2013-07-21 NOTE — Progress Notes (Signed)
Stroke Team Progress Note  HISTORY [name] is a 53 y.o. male left-handed male who presents with sudden onset left-sided weakness and aphasia. He was admitted 07/17/2013 with chest pain. He has a history of congestive heart failure and has a left ventricular assist device in place. His INR was low at home - his pump got clotted (likely causing MI), and he loaded himself and became supratherapeutic 4.97 in hospital. He was given vitamin K yesterday 07/18/2013, his INR came down to 1.9. He was then  started on heparin. Around 7:40 AM 07/19/2013, the nurse helped him to get dressed and stated that he seemed sleepy, he was moving his left side well. The patient endorses that he had symptoms start after he woke up this morning, but woke up in his normal state. He appears to understand well. Patient was not administerd TPA secondary to being on systemic heparin and INR 1.9  SUBJECTIVE His best friend, caretaker is at the bedside.  OBJECTIVE Most recent Vital Signs: Filed Vitals:   07/21/13 0831 07/21/13 0835 07/21/13 0900 07/21/13 0933  BP:      Pulse: 87 88  89  Temp:      TempSrc:      Resp: 19  17   Height:      Weight:      SpO2: 87% 100%  100%   CBG (last 3)   Recent Labs  07/20/13 2353 07/21/13 0411 07/21/13 0809  GLUCAP 148* 168* 128*    IV Fluid Intake:   . sodium chloride 20 mL/hr at 07/21/13 0400  . amiodarone (NEXTERONE PREMIX) 360 mg/200 mL dextrose 30 mg/hr (07/21/13 0400)  . heparin 1,250 Units/hr (07/21/13 0514)  . nitroGLYCERIN Stopped (07/17/13 1800)  . norepinephrine (LEVOPHED) Adult infusion 17 mcg/min (07/21/13 0712)  . propofol Stopped (07/21/13 0800)    MEDICATIONS  . allopurinol  100 mg Oral Daily  . antiseptic oral rinse  15 mL Mouth Rinse QID  . aspirin  300 mg Rectal Daily  . chlorhexidine  15 mL Mouth Rinse BID  . feeding supplement (VITAL HIGH PROTEIN)  1,000 mL Per Tube Q24H  . fentaNYL  100 mcg Intravenous Once  . magnesium oxide  400 mg Oral  Daily  . midazolam  2 mg Intravenous Once  . pantoprazole sodium  40 mg Per Tube Q1200  . piperacillin-tazobactam (ZOSYN)  IV  3.375 g Intravenous Q8H  . potassium chloride  20 mEq Per Tube Q4H  . potassium chloride      . sodium chloride  10-40 mL Intracatheter Q12H   PRN:  acetaminophen, fentaNYL, morphine injection, nitroGLYCERIN, ondansetron (ZOFRAN) IV  Diet:  NPO , tube feedings Activity:  Bedrest DVT Prophylaxis:  IV heparin  CLINICALLY SIGNIFICANT STUDIES Basic Metabolic Panel:   Recent Labs Lab 07/18/13 0149  07/20/13 1030 07/21/13 0415  NA 135*  < > 138 137  K 4.8  < > 3.7 3.4*  CL 97  < > 102 99  CO2 20  < > 23 22  GLUCOSE 184*  < > 147* 179*  BUN 16  < > 9 11  CREATININE 1.40*  < > 1.10 1.17  CALCIUM 8.7  < > 8.4 8.3*  MG 1.9  --   --   --   < > = values in this interval not displayed. Liver Function Tests:   Recent Labs Lab 07/18/13 0149 07/19/13 0500  AST 223* 471*  ALT 43 80*  ALKPHOS 98 85  BILITOT 0.4 1.8*  PROT  8.4* 7.6  ALBUMIN 3.9 3.6   CBC:  Recent Labs Lab 07/17/13 0923 07/18/13 0149  07/20/13 0435 07/21/13 0415  WBC 5.7 7.7  < > 9.2 10.1  NEUTROABS 3.8 6.1  --   --   --   HGB 17.3* 16.5  < > 14.4 13.8  HCT 49.5 48.0  < > 42.0 39.5  MCV 87.9 88.6  < > 88.8 87.4  PLT 269 263  < > 218 206  < > = values in this interval not displayed. Coagulation:   Recent Labs Lab 07/19/13 0533 07/19/13 1220 07/20/13 0800 07/21/13 0415  LABPROT 21.4* 17.4* 19.0* 19.3*  INR 1.92* 1.47 1.64* 1.68*   Cardiac Enzymes:   Recent Labs Lab 07/17/13 0923 07/17/13 1525 07/17/13 2017  TROPONINI 12.76* 17.02* 19.35*   Urinalysis: No results found for this basename: COLORURINE, APPERANCEUR, LABSPEC, PHURINE, GLUCOSEU, HGBUR, BILIRUBINUR, KETONESUR, PROTEINUR, UROBILINOGEN, NITRITE, LEUKOCYTESUR,  in the last 168 hours Lipid Panel    Component Value Date/Time   CHOL 166 07/20/2013 0020   TRIG 96 07/20/2013 0020   HDL 63 07/20/2013 0020   CHOLHDL  2.6 07/20/2013 0020   VLDL 19 07/20/2013 0020   LDLCALC 84 07/20/2013 0020   HgbA1C  Lab Results  Component Value Date   HGBA1C 5.7* 07/19/2013    Urine Drug Screen:     Component Value Date/Time   LABOPIA NONE DETECTED 06/21/2008 1300   COCAINSCRNUR NONE DETECTED 06/21/2008 1300   LABBENZ NONE DETECTED 06/21/2008 1300   AMPHETMU NONE DETECTED 06/21/2008 1300   THCU NONE DETECTED 06/21/2008 1300   LABBARB  Value: NONE DETECTED        DRUG SCREEN FOR MEDICAL PURPOSES ONLY.  IF CONFIRMATION IS NEEDED FOR ANY PURPOSE, NOTIFY LAB WITHIN 5 DAYS. 06/21/2008 1300    Alcohol Level: No results found for this basename: ETH,  in the last 168 hours   CT of the brain   07/20/2013 1. New hypodensities in the left basal ganglia and left thalamus,  concerning for evolving acute infarcts. Smaller foci of hypoattenuation in the right caudate and thalamus may also represent acute ischemia.  2. New paranasal sinus mucosal disease. 07/19/2013    1. No acute intracranial process. 2. Very minimal amount of geographic subcortical encephalomalacia within in the anterior lateral aspect of the left temporal lobe, at the site of the patient's prior hemorrhagic contusion.   Cerebral Angiogram 07/20/2013 Rt CFa approach. Distal small filling defects in the the orbitofrontal br of RT MCA sup division,and tertiary angular division of RT MCA  MRI of the brain    2D Echocardiogram  EF 10%. Diffuse kypokinesis  TEE   CXR   07/21/2013 1. Stable positioning of support apparatus. No pneumothorax. 2. Pulmonary venous congestion without frank evidence of edema. 3. Slight worsening of perihilar and bibasilar opacities, left greater than right, likely atelectasis.  07/20/2013 1. Repositioning of endotracheal tube, tip 4.5 cm of carina. 2. Improving left lower lobe atelectasis. 07/19/2013    Nasogastric tube extends into the stomach.    07/19/2013    Endotracheal tube terminates 4 cm above the carina.  Additional support apparatus  as above.  07/18/2013  Right subclavian suspect right-sided PICC line tip at cavoatrial junction without pneumothorax.     EKG  sinus tachycardia. For complete results please see formal report.   Therapy Recommendations   Physical Exam   Young african [place] male sedated and intubated. . Afebrile. Head is nontraumatic. Neck is supple without bruit.. Cardiac exam  no murmur or gallop. Lungs are clear to auscultation. Distal pulses are well felt. LVAD right side abdomen Neurological Exam : limited as patient sedated on propofol drip  Pupils 3 mm sluggishly reactive. Doll's eye movements are sluggish.Eyes partially open and will blink to threat but not follow gaze. No facial grimacing to pain. No spontaneous extremity movements. Moves left side greater than right to painful stimuli.Follows commands on right side Left plantar is equivocal right is upgoing.   ASSESSMENT [name] is a 53 y.o. male presenting with left sided weakness.  Imaging supports left basal ganglia and thalamic infarcts larger than right basal ganglia subcortical  infarcts. Infarcts felt to be embolic secondary to either atrial fibrillation, LVAD clot and/or acute MI.  On aspirin 81 mg orally every day, clopidogrel 75 mg orally every day and warfarin prior to admission. Now on aspirin 81 mg orally every day, clopidogrel 75 mg orally every day and heparin for secondary stroke prevention. Patient with resultant left arm hemiplegia, left leg hemiparesis, dysphagia, VDRF.   Systolic HF, EF 10%, has LVAD  new onset atrial fibrillation with RVR, on amiodarone drip, IV heparin  Elevated troponins hypertension Chronic RI Hyperlipidemia, LDL 84, on no statin PTA, now on no statin, at goal LDL < 100  obstructive sleep apnea Hx migraines Bipolar affective disorder  Hospital day # 4  TREATMENT/PLAN  Continue aspirin 81 mg orally every day, clopidogrel 75 mg orally every day and heparin for secondary stroke  prevention.  Will follow   This patient is critically ill and at significant risk of neurological worsening, death and care requires constant monitoring of vital signs, hemodynamics,respiratory and cardiac monitoring,review of multiple databases, neurological assessment, discussion with family, other specialists and medical decision making of high complexity. I spent 35 minutes of neurocritical care time  in the care of  this patient.  Annie MainSHARON BIBY, MSN, RN, ANVP-BC, ANP-BC, Lawernce IonGNP-BC [facility] Pager: [phone] 07/21/2013 10:09 AM  I have personally obtained a history, examined the patient, evaluated imaging results, and formulated the assessment and plan of care. I agree with the above.  Delia HeadyPramod Sethi, MD

## 2013-07-21 NOTE — Progress Notes (Signed)
ANTICOAGULATION CONSULT NOTE - Follow Up Consult  Pharmacy Consult for heparin Indication: LVAD thrombosis/CVA  Allergies  Allergen Reactions  . Ace Inhibitors Cough  . Lexapro [Escitalopram Oxalate] Other (See Comments)    somnolence    Patient Measurements: Height: 5\' 4"  (162.6 cm) Weight: 178 lb 9.2 oz (81 kg) IBW/kg (Calculated) : 59.2   Vital Signs: Temp: 100.6 F (38.1 C) (01/28 1136) Temp src: Oral (01/28 1136) BP: 103/87 mmHg (01/28 1231) Pulse Rate: 94 (01/28 1300)  Labs:  Recent Labs  07/19/13 0500  07/19/13 0548 07/19/13 1220  07/20/13 0435 07/20/13 0800 07/20/13 1030 07/20/13 1745 07/21/13 0415 07/21/13 1025  HGB  --   --  15.1  --   --  14.4  --   --   --  13.8  --   HCT  --   --  44.6  --   --  42.0  --   --   --  39.5  --   PLT  --   --  199  --   --  218  --   --   --  206  --   LABPROT  --   < >  --  17.4*  --   --  19.0*  --   --  19.3*  --   INR  --   < >  --  1.47  --   --  1.64*  --   --  1.68*  --   HEPARINUNFRC  --   < >  --  <0.10*  < >  --  0.33  --  0.37 0.24* 0.29*  CREATININE 1.28  --   --   --   --   --   --  1.10  --  1.17  --   < > = values in this interval not displayed.  Estimated Creatinine Clearance: 72.5 ml/min (by C-G formula based on Cr of 1.17).  . sodium chloride 20 mL/hr at 07/21/13 0400  . amiodarone (NEXTERONE PREMIX) 360 mg/200 mL dextrose 30 mg/hr (07/21/13 1329)  . heparin    . nitroGLYCERIN Stopped (07/17/13 1800)  . norepinephrine (LEVOPHED) Adult infusion 17 mcg/min (07/21/13 0712)  . 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 Coumadin on hold due to NPO, and IV heparin started last night after sheath out from attempted IR procedure.  Heparin level is slightly below goal at 0.29.  Confirmed with RN, no interruptions to IV infusion.  No bleeding noted per chart notes.  H/H trending down, but  pltc fairly stable.  Goal of Therapy:  Heparin level 0.3-0.7 units/ml Monitor platelets by anticoagulation protocol: Yes  Plan:  1. Increase IV heparin to 1350 units/hr. 2. Recheck heparin level in 6 hrs to confirm. 3. Continue daily heparin level and CBC. 4. Will f/u long term plans for anticoagulation.  Tad Moore, BCPS  Clinical Pharmacist Pager [phone]  07/21/2013 2:13 PM

## 2013-07-22 ENCOUNTER — Inpatient Hospital Stay (HOSPITAL_COMMUNITY): Payer: Medicare Other

## 2013-07-22 DIAGNOSIS — R079 Chest pain, unspecified: Secondary | ICD-10-CM | POA: Diagnosis not present

## 2013-07-22 DIAGNOSIS — T82897A Other specified complication of cardiac prosthetic devices, implants and grafts, initial encounter: Secondary | ICD-10-CM | POA: Diagnosis not present

## 2013-07-22 LAB — BASIC METABOLIC PANEL
BUN: 12 mg/dL (ref 6–23)
CHLORIDE: 100 meq/L (ref 96–112)
CO2: 23 mEq/L (ref 19–32)
Calcium: 8.5 mg/dL (ref 8.4–10.5)
Creatinine, Ser: 1.1 mg/dL (ref 0.50–1.35)
GFR calc non Af Amer: 77 mL/min — ABNORMAL LOW (ref >90)
GFR, EST AFRICAN AMERICAN: 89 mL/min — AB (ref >90)
Glucose, Bld: 148 mg/dL — ABNORMAL HIGH (ref 70–99)
POTASSIUM: 3.4 meq/L — AB (ref 3.7–5.3)
Sodium: 136 mEq/L — ABNORMAL LOW (ref 137–147)

## 2013-07-22 LAB — GLUCOSE, CAPILLARY
Glucose-Capillary: 130 mg/dL — ABNORMAL HIGH (ref 70–99)
Glucose-Capillary: 133 mg/dL — ABNORMAL HIGH (ref 70–99)
Glucose-Capillary: 139 mg/dL — ABNORMAL HIGH (ref 70–99)
Glucose-Capillary: 142 mg/dL — ABNORMAL HIGH (ref 70–99)
Glucose-Capillary: 146 mg/dL — ABNORMAL HIGH (ref 70–99)
Glucose-Capillary: 152 mg/dL — ABNORMAL HIGH (ref 70–99)
Glucose-Capillary: 287 mg/dL — ABNORMAL HIGH (ref 70–99)

## 2013-07-22 LAB — CBC
HCT: 37.4 % — ABNORMAL LOW (ref 39.0–52.0)
Hemoglobin: 12.9 g/dL — ABNORMAL LOW (ref 13.0–17.0)
MCH: 30.2 pg (ref 26.0–34.0)
MCHC: 34.5 g/dL (ref 30.0–36.0)
MCV: 87.6 fL (ref 78.0–100.0)
Platelets: 222 10*3/uL (ref 150–400)
RBC: 4.27 MIL/uL (ref 4.22–5.81)
RDW: 15.3 % (ref 11.5–15.5)
WBC: 10.2 10*3/uL (ref 4.0–10.5)

## 2013-07-22 LAB — HEPARIN LEVEL (UNFRACTIONATED)
Heparin Unfractionated: 0.6 IU/mL (ref 0.30–0.70)
Heparin Unfractionated: 1.21 IU/mL — ABNORMAL HIGH (ref 0.30–0.70)
Heparin Unfractionated: 0.42 IU/mL (ref 0.30–0.70)

## 2013-07-22 LAB — PROCALCITONIN: PROCALCITONIN: 0.17 ng/mL

## 2013-07-22 LAB — CARBOXYHEMOGLOBIN
Carboxyhemoglobin: 1.2 % (ref 0.5–1.5)
Carboxyhemoglobin: 1.4 % (ref 0.5–1.5)
METHEMOGLOBIN: 0.8 % (ref 0.0–1.5)
Methemoglobin: 0.6 % (ref 0.0–1.5)
O2 SAT: 68.1 %
O2 Saturation: 60 %
TOTAL HEMOGLOBIN: 13.4 g/dL — AB (ref 13.5–18.0)
TOTAL HEMOGLOBIN: 21 g/dL — AB (ref 13.5–18.0)

## 2013-07-22 LAB — PROTIME-INR
INR: 1.55 — ABNORMAL HIGH (ref 0.00–1.49)
PROTHROMBIN TIME: 18.2 s — AB (ref 11.6–15.2)

## 2013-07-22 LAB — LACTATE DEHYDROGENASE: LDH: 1357 U/L — ABNORMAL HIGH (ref 94–250)

## 2013-07-22 MED ORDER — HEPARIN (PORCINE) IN NACL 100-0.45 UNIT/ML-% IJ SOLN
1300.0000 [IU]/h | INTRAMUSCULAR | Status: DC
  Administered 2013-07-22: 1300 [IU]/h via INTRAVENOUS
  Filled 2013-07-22 (×3): qty 250

## 2013-07-22 MED ORDER — PRO-STAT SUGAR FREE PO LIQD
30.0000 mL | Freq: Three times a day (TID) | ORAL | Status: DC
  Administered 2013-07-22 – 2013-08-03 (×26): 30 mL
  Filled 2013-07-22 (×38): qty 30

## 2013-07-22 MED ORDER — POTASSIUM CHLORIDE 20 MEQ/15ML (10%) PO LIQD
20.0000 meq | ORAL | Status: AC
  Administered 2013-07-22 (×2): 20 meq
  Filled 2013-07-22 (×2): qty 15

## 2013-07-22 MED ORDER — MIDAZOLAM HCL 2 MG/2ML IJ SOLN
1.0000 mg | INTRAMUSCULAR | Status: DC | PRN
  Administered 2013-07-22 – 2013-07-23 (×3): 2 mg via INTRAVENOUS
  Filled 2013-07-22 (×4): qty 2

## 2013-07-22 MED ORDER — VITAL HIGH PROTEIN PO LIQD
1000.0000 mL | ORAL | Status: DC
  Administered 2013-07-22: 1000 mL
  Administered 2013-07-23: 15:00:00
  Administered 2013-07-23: 1000 mL
  Administered 2013-07-26: 11:00:00
  Administered 2013-07-26 – 2013-07-27 (×2): 1000 mL
  Administered 2013-07-28 (×5)
  Administered 2013-07-28: 1000 mL
  Administered 2013-07-29 – 2013-07-30 (×14)
  Administered 2013-07-30: 1000 mL
  Administered 2013-07-30 – 2013-07-31 (×12)
  Administered 2013-07-31 – 2013-08-04 (×14): 1000 mL
  Filled 2013-07-22 (×15): qty 1000

## 2013-07-22 NOTE — Progress Notes (Signed)
NUTRITION FOLLOW UP  Intervention:    Change TF regimen to Vital HP at 40 ml/hr with Prostat liquid protein 30 ml TID to provide 1260 kcals (67% of estimated kcal needs), 129 gm protein (100% of estimated protein needs), 803 ml of water  RD to follow for nutrition care plan  Nutrition Dx:   Inadequate oral intake related to inability to eat as evidenced by NPO status, ongoing  Goal:   Enteral nutrition to provide 60-70% of estimated calorie needs (22-25 kcals/kg ideal body weight) and 100% of estimated protein needs, based on ASPEN guidelines for permissive underfeeding in critically ill obese individuals, progressing  Monitor:   TF regimen & tolerance, respiratory status, weight, labs, I/O's  Assessment:   Patient with PMH of severe CHF, mitral regurgitation and HTN; [facility], as [facility] to [facility]; admitted for evaluation of new NSTEMI possibly related to pump thrombosis.   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   Patient s/p procedure 1/26:  CEREBRAL ANGIOGRAM  Patient is currently intubated on ventilator support MV: 7.8 L/min Temp (24hrs), Avg:99.4 F (37.4 C), Min:98.1 F (36.7 C), Max:102.8 F (39.3 C)   Vital HP formula infusing at 30 ml/hr via OGT.  RD to adjust TF regimen now that Propofol discontinued.  Height: Ht Readings from Last 1 Encounters:  07/17/13 5\' 4"  (1.626 m)    Weight Status ---> stable Wt Readings from Last 1 Encounters:  07/22/13 180 lb 1.9 oz (81.7 kg)    01/28  178 lb 01/27  178 lb 01/26  175 lb 01/25  175 lb 01/24  183 lb  Re-estimated needs:  Kcal: 1800 Protein: 120-130 gm Fluid: per MD  Skin: groin puncture site  Diet Order: NPO   Intake/Output Summary (Last 24 hours) at 07/22/13 1240 Last data filed at 07/22/13 1200  Gross per 24 hour  Intake 2491.53 ml  Output    730 ml  Net 1761.53 ml    Labs:   Recent Labs Lab  07/18/13 0149  07/20/13 1030 07/21/13 0415 07/22/13 0414  NA 135*  < > 138 137 136*  K 4.8  < > 3.7 3.4* 3.4*  CL 97  < > 102 99 100  CO2 20  < > 23 22 23   BUN 16  < > 9 11 12   CREATININE 1.40*  < > 1.10 1.17 1.10  CALCIUM 8.7  < > 8.4 8.3* 8.5  MG 1.9  --   --   --   --   GLUCOSE 184*  < > 147* 179* 148*  < > = values in this interval not displayed.  CBG (last 3)   Recent Labs  07/22/13 0353 07/22/13 0756 07/22/13 1211  GLUCAP 139* 146* 142*    Scheduled Meds: . allopurinol  100 mg Oral Daily  . antiseptic oral rinse  15 mL Mouth Rinse QID  . aspirin  300 mg Rectal Daily  . chlorhexidine  15 mL Mouth Rinse BID  . feeding supplement (VITAL HIGH PROTEIN)  1,000 mL Per Tube Q24H  . fentaNYL  100 mcg Intravenous Once  . insulin aspart  0-15 Units Subcutaneous Q4H  . magnesium oxide  400 mg Oral Daily  . pantoprazole sodium  40 mg Per Tube Q1200  . piperacillin-tazobactam (ZOSYN)  IV  3.375 g Intravenous Q8H  . potassium chloride  20 mEq Per Tube Q4H  . sodium chloride  10-40 mL Intracatheter Q12H  Continuous Infusions: . sodium chloride 20 mL/hr at 07/22/13 0005  . amiodarone (NEXTERONE PREMIX) 360 mg/200 mL dextrose 30 mg/hr (07/22/13 0227)  . heparin 1,300 Units/hr (07/22/13 0800)  . nitroGLYCERIN Stopped (07/17/13 1800)  . norepinephrine (LEVOPHED) Adult infusion 12 mcg/min (07/22/13 1200)    Maureen Chatters, RD, LDN Pager #: [phone] After-Hours Pager #: [phone]

## 2013-07-22 NOTE — Progress Notes (Signed)
Inpatient Diabetes Program Recommendations  AACE/ADA: New Consensus Statement on Inpatient Glycemic Control (2013)  Target Ranges:  Prepandial:   less than 140 mg/dL      Peak postprandial:   less than 180 mg/dL (1-2 hours)      Critically ill patients:  140 - 180 mg/dL   Reason for Visit: CBG at 0300 is 287 mg/dl  Diabetes history: [id number] is 5.7% on 07/21/13 Outpatient Diabetes medications: None Current orders for Inpatient glycemic control: Novolog MODERATE correction scale every 4 hours.     Note: Patient had one elevated CBG at 0300 of 287 mg/dl and was quickly rechecked for result of 139 mg/dl. CBGs less than 180 mg/dl.  No DM medications taken at home.  HgbA1C is 5.7%.  Patient is on continuous tube feedings.  Recommend changing Novolog correction scale to SENSITIVE every 4 hours.  Will follow while in hospital.  Smith Mince RN BSN CDE

## 2013-07-22 NOTE — Progress Notes (Signed)
ANTICOAGULATION CONSULT NOTE - Follow Up Consult  Pharmacy Consult for heparin Indication: LVAD thrombosis/CVA  Allergies  Allergen Reactions  . Ace Inhibitors Cough  . Lexapro [Escitalopram Oxalate] Other (See Comments)    somnolence    Patient Measurements: Height: 5\' 4"  (162.6 cm) Weight: 180 lb 1.9 oz (81.7 kg) IBW/kg (Calculated) : 59.2   Vital Signs: Temp: 98.1 F (36.7 C) (01/29 0755) Temp src: Oral (01/29 0755) BP: 92/83 mmHg (01/29 0114) Pulse Rate: 73 (01/29 0816)  Labs:  Recent Labs  07/20/13 0435 07/20/13 0800 07/20/13 1030  07/21/13 0415 07/21/13 1025 07/21/13 2025 07/22/13 0414  HGB 14.4  --   --   --  13.8  --   --  12.9*  HCT 42.0  --   --   --  39.5  --   --  37.4*  PLT 218  --   --   --  206  --   --  222  LABPROT  --  19.0*  --   --  19.3*  --   --  18.2*  INR  --  1.64*  --   --  1.68*  --   --  1.55*  HEPARINUNFRC  --  0.33  --   < > 0.24* 0.29* 0.45 0.60  CREATININE  --   --  1.10  --  1.17  --   --  1.10  < > = values in this interval not displayed.  Estimated Creatinine Clearance: 77.5 ml/min (by C-G formula based on Cr of 1.1).  . sodium chloride 20 mL/hr at 07/22/13 0005  . amiodarone (NEXTERONE PREMIX) 360 mg/200 mL dextrose 30 mg/hr (07/22/13 0227)  . heparin 1,300 Units/hr (07/22/13 0800)  . nitroGLYCERIN Stopped (07/17/13 1800)  . norepinephrine (LEVOPHED) Adult infusion 13 mcg/min (07/22/13 0200)  . 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 Coumadin on hold due to NPO, and IV heparin started last night after sheath out from attempted IR procedure.    No bleeding noted per chart notes.  H/H trending down, but pltc fairly stable.  AM heparin level just above goal of 0.3-0.5 s/p CVA.  Goal of Therapy:  Heparin level 0.3-0.5 Monitor platelets by anticoagulation protocol: Yes  Plan:  1. Decrease IV heparin to  1300 units/hr. 2. Recheck heparin level in 6 hrs to confirm. 3. Continue daily heparin level and CBC. 4. Will f/u long term plans for anticoagulation.  Tad Moore, BCPS  Clinical Pharmacist Pager [phone]  07/22/2013 8:36 AM

## 2013-07-22 NOTE — Procedures (Addendum)
Arterial Line Insertion Procedure Note [name] [id number] 03-Jan-1961  Procedure: Insertion of Arterial line Indications: Hemodynamic monitoring  Procedure Details Consent: Risks of procedure as well as the alternatives and risks of each were explained to the (patient/caregiver).  Consent for procedure obtained. Time Out: Verified patient identification, verified procedure, site/side was marked, verified correct patient position, special equipment/implants available, medications/allergies/relevent history reviewed, required imaging and test results available.  Performed  Maximum sterile technique was used including antiseptics, cap, gloves, gown, hand hygiene, mask and sheet. Skin prep: Chlorhexidine; local anesthetic administered Initially the right wrist area was prepped and draped in routine sterile fashion and the L radial artery was cannulated however despite multiple attempts we were unable to thread the artery into the radial artery and the procedure was abandoned on the left. We then turned our attention to the R radial site which was prepped in a similar fashion. On several occassions we were able to cannulate the radial artery but were unable to pass the wire successfully into the radial artery. The procedure was then abandoned on the R.   Additional critical care time > 1 hour.   [name] 07/22/2013, 5:17 PM

## 2013-07-22 NOTE — Progress Notes (Signed)
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 He is s/p LVAD placement at [facility] March 2013, as bridge to transplant. Admitted for evaluation of new NSTEMI felt possibly to be related to pump thrombosis. Hosp course complicated by episodes of CP and hypotension on 1/24 pm hours. 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 PCCM asked to see post-op for vent weaning.    SIGNIFICANT EVENTS / STUDIES:  1/24: admitted w/ CP and trop rise. Did have sub-therapeutic INR at home so concern was for pump thrombosis.  1/24: evening had CP, rising Trop I, got mso4 and NTG>>>got hypotensive requiring pressors.  1/25 INR supra-therapeutic. Coumadin stopped, heparin started 1/26: CT head: negative for bleed.  1/26:cerebral arteriogram:  acute right MCA embolic CVA. Not amendable to extraction due to distal location.    LINES / TUBES: LVAD OETT 1/26>>> Right PICC 1/25>>> Right fem arterial sheath 1/26>>>  CULTURES: Resp 1/27 >> few GNR >>   ANTIBIOTICS: 1/27 zosyn (aspiration, GNR) >>   SUBJECTIVE:  Opened eyes Apneic on PSV this am, not following commands Remains on norepi  VITAL SIGNS: Temp:  [98.1 F (36.7 C)-102.8 F (39.3 C)] 98.1 F (36.7 C) (01/29 0755) Pulse Rate:  [41-94] 73 (01/29 0816) Resp:  [14-32] 14 (01/29 0816) BP: (92-103)/(52-87) 92/83 mmHg (01/29 0114) SpO2:  [98 %-100 %] 100 % (01/29 0816) Arterial Line BP: (89-205)/(80-102) 110/99 mmHg (01/29 0530) FiO2 (%):  [30 %] 30 % (01/29 0816) Weight:  [81.7 kg (180 lb 1.9 oz)] 81.7 kg (180 lb 1.9 oz) (01/29 0500) HEMODYNAMICS: CVP:  [8 mmHg-14 mmHg] 13 mmHg VENTILATOR SETTINGS: Vent Mode:  [-] PRVC FiO2 (%):  [30  %] 30 % Set Rate:  [14 bmp] 14 bmp Vt Set:  [550 mL] 550 mL PEEP:  [5 cmH20] 5 cmH20 Pressure Support:  [10 cmH20] 10 cmH20 Plateau Pressure:  [11 cmH20-19 cmH20] 17 cmH20 INTAKE / OUTPUT: Intake/Output     01/28 0701 - 01/29 0700 01/29 0701 - 01/30 0700   I.V. (mL/kg) 1653.1 (20.2) 71.9 (0.9)   NG/GT 778.3 30   IV Piggyback 150    Total Intake(mL/kg) 2581.5 (31.6) 101.9 (1.2)   Urine (mL/kg/hr) 1065 (0.5) 40 (0.3)   Total Output 1065 40   Net +1516.5 +61.9          PHYSICAL EXAMINATION: General:  53 year old aam currently orally intubated.  Neuro:  Eyes open, PERRL, not following commands, not spont moving UE's HEENT:  Orally intubated.  Cardiovascular:  LVAD pump hum. No edema  Lungs:  Coarse scattered rhonchi  Abdomen:  Soft, nontender  Musculoskeletal:  Intact  Skin:  Intact  LABS:  CBC  Recent Labs Lab 07/20/13 0435 07/21/13 0415 07/22/13 0414  WBC 9.2 10.1 10.2  HGB 14.4 13.8 12.9*  HCT 42.0 39.5 37.4*  PLT 218 206 222   Coag's  Recent Labs Lab 07/20/13 0800 07/21/13 0415 07/22/13 0414  INR 1.64* 1.68* 1.55*   BMET  Recent Labs Lab 07/20/13 1030 07/21/13 0415 07/22/13 0414  NA 138 137 136*  K 3.7 3.4* 3.4*  CL 102 99 100  CO2 23 22 23   BUN 9 11 12  CREATININE 1.10 1.17 1.10  GLUCOSE 147* 179* 148*   Electrolytes  Recent Labs Lab 07/18/13 0149  07/20/13 1030 07/21/13 0415 07/22/13 0414  CALCIUM 8.7  < > 8.4 8.3* 8.5  MG 1.9  --   --   --   --   < > = values in this interval not displayed. Sepsis Markers  Recent Labs Lab 07/20/13 1030 07/21/13 0415 07/22/13 0414  PROCALCITON 0.13 0.19 0.17   ABG  Recent Labs Lab 07/19/13 1301  PHART 7.476*  PCO2ART 36.1  PO2ART 210.0*   Liver Enzymes  Recent Labs Lab 07/17/13 0923 07/18/13 0149 07/19/13 0500  AST 152* 223* 471*  ALT 31 43 80*  ALKPHOS 87 98 85  BILITOT 0.6 0.4 1.8*  ALBUMIN 4.2 3.9 3.6   Cardiac Enzymes  Recent Labs Lab 07/17/13 0923 07/17/13 1525  07/17/13 2017  TROPONINI 12.76* 17.02* 19.35*  PROBNP 2538.0*  --   --    Glucose  Recent Labs Lab 07/21/13 1558 07/21/13 1935 07/21/13 2336 07/22/13 0352 07/22/13 0353 07/22/13 0756  GLUCAP 149* 152* 172* 287* 139* 146*    Imaging Dg Chest Port 1 View  07/22/2013   CLINICAL DATA:  Endotracheal tube placement. Central line placement.  EXAM: PORTABLE CHEST - 1 VIEW  COMPARISON:  07/21/2013  FINDINGS: Endotracheal tube in good position. Gastric tube in the stomach. Right arm PICC tip in the SVC at the cavoatrial junction. AICD unchanged. Left ventricular assist device unchanged.  Mild atelectasis in the lung bases has improved. Negative for edema or effusion.  IMPRESSION: Support lines remain in unchanged position.  Improved aeration with decreased bibasilar atelectasis.   Electronically Signed   By: Marlan Palau M.D.   On: 07/22/2013 08:18   Dg Chest Port 1 View  07/21/2013   CLINICAL DATA:  Evaluate endotracheal tube positioning  EXAM: PORTABLE CHEST - 1 VIEW  COMPARISON:  07/20/2013; 07/19/2013; 07/18/2013  FINDINGS: Grossly unchanged cardiac silhouette and mediastinal contours post median sternotomy and valve replacement. Stable position of support apparatus. No pneumothorax. Mild pulmonary venous congestion without frank evidence of edema. Slight worsening of bilateral perihilar and medial basilar opacities, left greater than right. Trace of sided effusion is not excluded, likely unchanged. Unchanged bones.  IMPRESSION: 1.  Stable positioning of support apparatus.  No pneumothorax. 2. Pulmonary venous congestion without frank evidence of edema. 3. Slight worsening of perihilar and bibasilar opacities, left greater than right, likely atelectasis.   Electronically Signed   By: Simonne Come M.D.   On: 07/21/2013 08:28   Dg Chest Port 1 View  07/20/2013   CLINICAL DATA:  Intubated  EXAM: PORTABLE CHEST - 1 VIEW  COMPARISON:  Earlier film of the same day  FINDINGS: Endotracheal tube has been  repositioned, tip now 4.5 cm above carina. Stable nasogastric tube, left subclavian AICD, and LVAD. Previous median sternotomy. Stable cardiomegaly. Improved aeration of the left lung base. Marland Kitchen No effusion.  IMPRESSION: 1. Repositioning of endotracheal tube, tip 4.5 cm of carina. 2. Improving left lower lobe atelectasis.   Electronically Signed   By: Oley Balm M.D.   On: 07/20/2013 18:27     CXR:   ASSESSMENT / PLAN:  PULMONARY A: acute respiratory failure  S/p acute right MCA CVA. Has copious airway secretions. Concerned about element of edema. Pulmonary exam had been unremarkable prior.  P:   Full vent support Sedation w/ diprivan to allow for neuro-exam > currently off. Consider change to prn pushes versed/fentanyl Continue SBT's, may not  be ready to assess for extubation due to shock   CARDIOVASCULAR A:  NSTEMI (felt to be Pump thrombosis) Acute on Chronic systolic HF (EF 10-15%) w/ LVAD placed in march 2013 at [facility].  Cardiogenic shock  P  LVAD per cards, current flow rate 4.0 levophed for MAP >70  Asa, plavix and heparin per cards   RENAL A:  AKI in setting of hypoperfusion, s/p contrast load for cerebral arteriogram.  P:   Avoid hypovolemia MAP goals >70 F/u chemistry  Renal dose meds   GASTROINTESTINAL A:   Rising LFTs in setting of on-going ischemia  At risk for dysphagia   P:   PPI for PUD  Start TFs Will need swallow eval after off vent   HEMATOLOGIC A:   Chronic anticoagulation (on coumadin for LVAD) Concern about acute pump related embolic events P:  Heparin per pharmacy   INFECTIOUS A:  ? aspiration pneumonia P:   F/u cxr, follow `Culture sputum  Continue zosyn   ENDOCRINE A:  Mild hyperglycemia  P:   ssi if glucose > 150   NEUROLOGIC A:  Right MCA embolic CVA. Not amendable to IR  P:   Change propofol to prn versed + fentanyl recs per neuro   TODAY'S SUMMARY: continue SBTs, lighten sedation, wean norepi as able.   I have  personally obtained a history, examined the patient, evaluated laboratory and imaging results, formulated the assessment and plan and placed orders.  CRITICAL CARE: The patient is critically ill with multiple organ systems failure and requires high complexity decision making for assessment and support, frequent evaluation and titration of therapies, application of advanced monitoring technologies and extensive interpretation of multiple databases. Critical Care Time devoted to patient care services described in this note is 40 minutes.    Levy Pupaobert Holt Woolbright, MD, PhD 07/22/2013, 8:47 AM [facility] [phone] or if no answer [phone]

## 2013-07-22 NOTE — Progress Notes (Signed)
ANTICOAGULATION CONSULT NOTE - Follow Up Consult  Pharmacy Consult for heparin Indication: LVAD thrombosis/CVA  Allergies  Allergen Reactions  . Ace Inhibitors Cough  . Lexapro [Escitalopram Oxalate] Other (See Comments)    somnolence    Patient Measurements: Height: 5\' 4"  (162.6 cm) Weight: 180 lb 1.9 oz (81.7 kg) IBW/kg (Calculated) : 59.2   Vital Signs: Temp: 99.9 F (37.7 C) (01/29 1939) Temp src: Oral (01/29 1939) Pulse Rate: 76 (01/29 2100)  Labs:  Recent Labs  07/20/13 0435 07/20/13 0800 07/20/13 1030  07/21/13 0415  07/22/13 0414 07/22/13 1700 07/22/13 2045  HGB 14.4  --   --   --  13.8  --  12.9*  --   --   HCT 42.0  --   --   --  39.5  --  37.4*  --   --   PLT 218  --   --   --  206  --  222  --   --   LABPROT  --  19.0*  --   --  19.3*  --  18.2*  --   --   INR  --  1.64*  --   --  1.68*  --  1.55*  --   --   HEPARINUNFRC  --  0.33  --   < > 0.24*  < > 0.60 1.21* 0.42  CREATININE  --   --  1.10  --  1.17  --  1.10  --   --   < > = values in this interval not displayed.  Estimated Creatinine Clearance: 77.5 ml/min (by C-G formula based on Cr of 1.1).  . sodium chloride 20 mL/hr at 07/22/13 0005  . amiodarone (NEXTERONE PREMIX) 360 mg/200 mL dextrose 30 mg/hr (07/22/13 1342)  . heparin 1,300 Units/hr (07/22/13 1814)  . nitroGLYCERIN Stopped (07/17/13 1800)  . 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 Coumadin on hold due to NPO, and IV heparin started last night after sheath out from attempted IR procedure.    No bleeding noted per chart notes.  H/H trending down, but pltc fairly stable.  Heparin level is therapeutic at 0.42 s/p CVA.  Heparin level was drawn earlier today and was reported as 1.21, but the level was drawn near where the IV heparin was running and was falsely high.  Goal of Therapy:   Heparin level 0.3-0.5 Monitor platelets by anticoagulation protocol: Yes  Plan:  Continue heparin at 1300 units/hr  Celedonio Miyamoto, PharmD, [facility] Clinical Pharmacist Pager [phone]   07/22/2013 9:45 PM

## 2013-07-22 NOTE — Progress Notes (Deleted)
Patient refused onQ and chest tube dressing changes; pt educated and encouraged to allow dressing changes. Dressings saturated with serosanguinous drainage. Waited two hours and again tried to change dressings. Patient refuses. Patient educated, again, on the importance of dressing changes. Will continue to monitor.  

## 2013-07-22 NOTE — Progress Notes (Signed)
[facility] ADULT ICU REPLACEMENT PROTOCOL FOR AM LAB REPLACEMENT ONLY  The patient does apply for the [facility] Adult ICU Electrolyte Replacment Protocol based on the criteria listed below:   1. Is GFR >/= 40 ml/min? yes  Patient's GFR today is 89 2. Is urine output >/= 0.5 ml/kg/hr for the last 6 hours? yes Patient's UOP is .6 ml/kg/hr 3. Is BUN < 60 mg/dL? yes  Patient's BUN today is 12 4. Abnormal electrolyte(s): Potassium 5. Ordered repletion with: Potassium per protocol  Chas Axel P 07/22/2013 5:27 AM

## 2013-07-22 NOTE — Progress Notes (Signed)
eLink Physician-Brief Progress Note Patient Name: [name] DOB: 15-Aug-1960 MRN: [id number]  Date of Service  07/22/2013   HPI/Events of Note   Lost aline Pressors LVAD  eICU Interventions  RT to replace a-line   Intervention Category Major Interventions: Shock - evaluation and management  Mava Suares 07/22/2013, 5:43 AM

## 2013-07-22 NOTE — Progress Notes (Signed)
Patient ID: [name], male   DOB: 12/29/1960, 53 y.o.   MRN: [id number] Advanced Heart Failure Rounding Note  [name] is 53 y/o male with a history of severe CHF due to NICM EF 10-15%, and severe MR s/p HM II LVAD [facility]. His medical history also consists of CRI, NSVT s/p ICD and LV thrombus.   Admitted 1/24 with NSTEMI felt secondary due embolic phenomenon due to pump thrombosis.  LDH high around 900.   On way to cath lab 1/26 developed acute onset of aphasia and left-sided hemiparesis. CT negative for bleed. Taken emergently to neuro-interventional suite but no amenable lesions. Remains intubated on levophed. Remains on propofol gtt, unable to assess neuro status.   Yesterday had TEE with no evidence of clot but with RV failure. Levo weaned to 13 mcg/min. More awake this am. Remains intubated. Back on heparin.  Follows commands. Plegic on left.   07/19/13:  INR 1.9>1.47>1.68>1.5  LDH 976>843>1568>1693>1595>1357  Co-ox 55.3>64.4>83>73>68  CVP 13  MAP's:  88 - 96   Repeat head CT this: 1. New hypodensities in the left basal ganglia and left thalamus,  concerning for evolving acute infarcts. Smaller foci of  hypoattenuation in the right caudate and thalamus may also represent  acute ischemia.  2. New paranasal sinus mucosal disease.   VAD interrogated personally.  Flow 3.7 , Speed 9400 PI 4.4,  Power 5.0   Events: multiple PI events  Alarms: none     Filed Vitals:   07/22/13 1500 07/22/13 1515 07/22/13 1602 07/22/13 1700  BP:      Pulse: 80 81  78  Temp:   99.3 F (37.4 C)   TempSrc:   Oral   Resp: 14 17  14   Height:      Weight:      SpO2: 99% 100%  99%    Intake/Output Summary (Last 24 hours) at 07/22/13 1710 Last data filed at 07/22/13 1500  Gross per 24 hour  Intake 2440.9 ml  Output    670 ml  Net 1770.9 ml    LABS: Basic Metabolic Panel:  Recent Labs  40/98/1101/28/15 0415 07/22/13 0414  NA 137 136*  K 3.4* 3.4*  CL 99 100  CO2 22 23   GLUCOSE 179* 148*  BUN 11 12  CREATININE 1.17 1.10  CALCIUM 8.3* 8.5   Liver Function Tests: No results found for this basename: AST, ALT, ALKPHOS, BILITOT, PROT, ALBUMIN,  in the last 72 hours No results found for this basename: LIPASE, AMYLASE,  in the last 72 hours CBC:  Recent Labs  07/21/13 0415 07/22/13 0414  WBC 10.1 10.2  HGB 13.8 12.9*  HCT 39.5 37.4*  MCV 87.4 87.6  PLT 206 222   Cardiac Enzymes: No results found for this basename: CKTOTAL, CKMB, CKMBINDEX, TROPONINI,  in the last 72 hours RADIOLOGY: Dg Chest Portable 1 View  07/17/2013   CLINICAL DATA:  Left-sided chest pain for 3 days, weakness and dizziness.  EXAM: PORTABLE CHEST - 1 VIEW  COMPARISON:  08/03/2012  FINDINGS: Evidence of median sternotomy noted with LVAD in place. Left-sided defibrillator noted. Mild prominence of the cardiac silhouette persists. Tricuspid valvuloplasty reidentified. Trace left pleural fluid or thickening persists. No new pulmonary opacity.  IMPRESSION: No new acute abnormality. Stable appearance of trace left pleural fluid or thickening.   Electronically Signed   By: Christiana PellantGretchen  Green M.D.   On: 07/17/2013 10:12    PHYSICAL EXAM General: Intubated sedated Neck: JVP to jaw  Lungs: Clear to auscultation bilaterally with normal respiratory effort. CV: LVAD pump hum normal.  No peripheral edema.   Abdomen: Soft, nontender, no hepatosplenomegaly, no distention.  Neurologic: Intubated/sedated. Expressive aphasia prior to intubation. L sided paralysis Extremities: No clubbing or cyanosis.   TELEMETRY: Reviewed telemetry pt in sinus tachy in 90-110   Assessment:   1. Acute embolic CVA 2. NSTEMI - Likely pump thrombosis with coronary embolus.  2. Chronic systolic HF s/p HM II VAD  3. NICM EF 15%  4. Hyperkalemia  5. HTN - MAP 70s currently  6. Acute respiratory failure 7. Shock - suspect mixed picture RV failure/vasodilatory  Plan/Discussion:    He has had NSTEMI and acute CVA due  to pump thrombosis/embolism. Remains intubated and sedated for airway protection. Hemodynamically improved but still inotrope dependent due to R heart failure. Will attempt to wean levophed as tolerated. Will not diurese unless needed to help with extubation as he is preload dependent with RV failure and VAD.   Repeat CT 1/26 without bleed. Continue heparin and ASA 325 PR. Resume Plavix tomorrow. I worry he will have severe residual deficits from his CVA.  Given acute CVA will manage NSTEMI medically. No b-blocker due to shock.   Prognosis is guarded at this point. If he recovers will need to consider resuming evaluation for pump exchange.   The patient is critically ill with multiple organ systems failure and requires high complexity decision making for assessment and support, frequent evaluation and titration of therapies, application of advanced monitoring technologies and extensive interpretation of multiple databases.   Critical Care Time devoted to patient care services described in this note is 45 Minutes.   5:10 PM

## 2013-07-22 NOTE — Progress Notes (Addendum)
Arterial line kinked.  Not able to save during pressure monitor change. Insertion attempted X 3 by RT.

## 2013-07-22 NOTE — Progress Notes (Signed)
Stroke Team Progress Note  HISTORY [name] is a 53 y.o. male left-handed male who presents with sudden onset left-sided weakness and aphasia. He was admitted 07/17/2013 with chest pain. He has a history of congestive heart failure and has a left ventricular assist device in place. His INR was low at home - his pump got clotted (likely causing MI), and he loaded himself and became supratherapeutic 4.97 in hospital. He was given vitamin K yesterday 07/18/2013, his INR came down to 1.9. He was then  started on heparin. Around 7:40 AM 07/19/2013, the nurse helped him to get dressed and stated that he seemed sleepy, he was moving his left side well. The patient endorses that he had symptoms start after he woke up this morning, but woke up in his normal state. He appears to understand well. Patient was not administerd TPA secondary to being on systemic heparin and INR 1.9  SUBJECTIVE The patient appears to be very sedated today. He does follow simple commands. He is still intubated.  OBJECTIVE Most recent Vital Signs: Filed Vitals:   07/22/13 0755 07/22/13 0816 07/22/13 1208 07/22/13 1222  BP:      Pulse:  73  76  Temp: 98.1 F (36.7 C)  98.9 F (37.2 C)   TempSrc: Oral  Oral   Resp:  14  14  Height:      Weight:      SpO2:  100%  99%   CBG (last 3)   Recent Labs  07/22/13 0352 07/22/13 0353 07/22/13 0756  GLUCAP 287* 139* 146*    IV Fluid Intake:   . sodium chloride 20 mL/hr at 07/22/13 0005  . amiodarone (NEXTERONE PREMIX) 360 mg/200 mL dextrose 30 mg/hr (07/22/13 0227)  . heparin 1,300 Units/hr (07/22/13 0800)  . nitroGLYCERIN Stopped (07/17/13 1800)  . norepinephrine (LEVOPHED) Adult infusion 13 mcg/min (07/22/13 0200)    MEDICATIONS  . allopurinol  100 mg Oral Daily  . antiseptic oral rinse  15 mL Mouth Rinse QID  . aspirin  300 mg Rectal Daily  . chlorhexidine  15 mL Mouth Rinse BID  . feeding supplement (VITAL HIGH PROTEIN)  1,000 mL Per Tube Q24H  . fentaNYL  100 mcg  Intravenous Once  . insulin aspart  0-15 Units Subcutaneous Q4H  . magnesium oxide  400 mg Oral Daily  . pantoprazole sodium  40 mg Per Tube Q1200  . piperacillin-tazobactam (ZOSYN)  IV  3.375 g Intravenous Q8H  . potassium chloride  20 mEq Per Tube Q4H  . sodium chloride  10-40 mL Intracatheter Q12H   PRN:  acetaminophen, fentaNYL, midazolam, nitroGLYCERIN, ondansetron (ZOFRAN) IV  Diet:  NPO , tube feedings Activity:  Bedrest DVT Prophylaxis:  IV heparin  CLINICALLY SIGNIFICANT STUDIES Basic Metabolic Panel:   Recent Labs Lab 07/18/13 0149  07/21/13 0415 07/22/13 0414  NA 135*  < > 137 136*  K 4.8  < > 3.4* 3.4*  CL 97  < > 99 100  CO2 20  < > 22 23  GLUCOSE 184*  < > 179* 148*  BUN 16  < > 11 12  CREATININE 1.40*  < > 1.17 1.10  CALCIUM 8.7  < > 8.3* 8.5  MG 1.9  --   --   --   < > = values in this interval not displayed. Liver Function Tests:   Recent Labs Lab 07/18/13 0149 07/19/13 0500  AST 223* 471*  ALT 43 80*  ALKPHOS 98 85  BILITOT 0.4 1.8*  PROT 8.4* 7.6  ALBUMIN 3.9 3.6   CBC:  Recent Labs Lab 07/17/13 0923 07/18/13 0149  07/21/13 0415 07/22/13 0414  WBC 5.7 7.7  < > 10.1 10.2  NEUTROABS 3.8 6.1  --   --   --   HGB 17.3* 16.5  < > 13.8 12.9*  HCT 49.5 48.0  < > 39.5 37.4*  MCV 87.9 88.6  < > 87.4 87.6  PLT 269 263  < > 206 222  < > = values in this interval not displayed. Coagulation:   Recent Labs Lab 07/19/13 1220 07/20/13 0800 07/21/13 0415 07/22/13 0414  LABPROT 17.4* 19.0* 19.3* 18.2*  INR 1.47 1.64* 1.68* 1.55*   Cardiac Enzymes:   Recent Labs Lab 07/17/13 0923 07/17/13 1525 07/17/13 2017  TROPONINI 12.76* 17.02* 19.35*   Urinalysis: No results found for this basename: COLORURINE, APPERANCEUR, LABSPEC, PHURINE, GLUCOSEU, HGBUR, BILIRUBINUR, KETONESUR, PROTEINUR, UROBILINOGEN, NITRITE, LEUKOCYTESUR,  in the last 168 hours Lipid Panel    Component Value Date/Time   CHOL 166 07/20/2013 0020   TRIG 96 07/20/2013 0020    HDL 63 07/20/2013 0020   CHOLHDL 2.6 07/20/2013 0020   VLDL 19 07/20/2013 0020   LDLCALC 84 07/20/2013 0020   HgbA1C  Lab Results  Component Value Date   HGBA1C 5.7* 07/19/2013    Urine Drug Screen:     Component Value Date/Time   LABOPIA NONE DETECTED 06/21/2008 1300   COCAINSCRNUR NONE DETECTED 06/21/2008 1300   LABBENZ NONE DETECTED 06/21/2008 1300   AMPHETMU NONE DETECTED 06/21/2008 1300   THCU NONE DETECTED 06/21/2008 1300   LABBARB  Value: NONE DETECTED        DRUG SCREEN FOR MEDICAL PURPOSES ONLY.  IF CONFIRMATION IS NEEDED FOR ANY PURPOSE, NOTIFY LAB WITHIN 5 DAYS. 06/21/2008 1300    Alcohol Level: No results found for this basename: ETH,  in the last 168 hours   CT of the brain   07/20/2013 1. New hypodensities in the left basal ganglia and left thalamus,  concerning for evolving acute infarcts. Smaller foci of hypoattenuation in the right caudate and thalamus may also represent acute ischemia.  2. New paranasal sinus mucosal disease. 07/19/2013    1. No acute intracranial process. 2. Very minimal amount of geographic subcortical encephalomalacia within in the anterior lateral aspect of the left temporal lobe, at the site of the patient's prior hemorrhagic contusion.   Cerebral Angiogram 07/20/2013 Rt CFa approach. Distal small filling defects in the the orbitofrontal br of RT MCA sup division,and tertiary angular division of RT MCA  MRI of the brain    2D Echocardiogram  EF 10%. Diffuse kypokinesis  CXR   07/22/2013 Support lines remain in unchanged position. Improved aeration with decreased bibasilar atelectasis.  07/21/2013 1. Stable positioning of support apparatus. No pneumothorax. 2. Pulmonary venous congestion without frank evidence of edema. 3. Slight worsening of perihilar and bibasilar opacities, left greater than right, likely atelectasis.  07/20/2013 1. Repositioning of endotracheal tube, tip 4.5 cm of carina. 2. Improving left lower lobe atelectasis. 07/19/2013     Nasogastric tube extends into the stomach.    07/19/2013    Endotracheal tube terminates 4 cm above the carina.  Additional support apparatus as above.  07/18/2013  Right subclavian suspect right-sided PICC line tip at cavoatrial junction without pneumothorax.     EKG  sinus tachycardia. For complete results please see formal report.   Therapy Recommendations   Physical Exam   Young african [place] male sedated and intubated. .Marland Kitchen  Afebrile. Head is nontraumatic. Neck is supple without bruit.. Cardiac exam no murmur or gallop. Lungs are clear to auscultation. Distal pulses are well felt. LVAD right side abdomen Neurological Exam : limited as patient sedated on propofol drip  Pupils 3 mm sluggishly reactive. Doll's eye movements are sluggish.Eyes partially open and will blink to threat but not follow gaze. No facial grimacing to pain. No spontaneous extremity movements. Moves left side greater than right to painful stimuli.Follows commands on right side Left plantar is equivocal right is upgoing.   ASSESSMENT [name] is a 53 y.o. male presenting with left sided weakness.  Imaging supports left basal ganglia and thalamic infarcts larger than right basal ganglia subcortical  infarcts. Infarcts felt to be embolic secondary to either atrial fibrillation, LVAD clot and/or acute MI.  On aspirin 81 mg orally every day, clopidogrel 75 mg orally every day and warfarin prior to admission. Now on aspirin suppositories and intravenous heparin for secondary stroke prevention. Patient with resultant left arm hemiplegia, left leg hemiparesis, dysphagia, VDRF.   Systolic HF, EF 10%, has LVAD  new onset atrial fibrillation with RVR, on amiodarone drip, IV heparin  Elevated troponins hypertension Chronic RI Hyperlipidemia, LDL 84, on no statin PTA, now on no statin, at goal LDL < 100  obstructive sleep apnea Hx migraines Bipolar affective disorder TEE performed 07/21/2013. Ejection fraction 15-20%. No  obvious clot was noted.  Hospital day # 5  TREATMENT/PLAN  Continue aspirin 81 mg orally every day, clopidogrel 75 mg orally every day and heparin for secondary stroke prevention.  Recommend change propofol to versed and fentanyl  Will follow  Currently on aspirin suppositories and intravenous heparin. Not on Plavix at this time.  Delton See PA-C [facility] Pager [phone] 07/22/2013, 1:31 PM  I have personally examined this patient, reviewed pertinent data, and developed plan of care and agrees with above Delia Heady, MD

## 2013-07-23 ENCOUNTER — Inpatient Hospital Stay (HOSPITAL_COMMUNITY): Payer: Medicare Other

## 2013-07-23 LAB — CARBOXYHEMOGLOBIN
CARBOXYHEMOGLOBIN: 1.6 % — AB (ref 0.5–1.5)
METHEMOGLOBIN: 1.4 % (ref 0.0–1.5)
O2 SAT: 63.1 %
Total hemoglobin: 17.5 g/dL (ref 13.5–18.0)

## 2013-07-23 LAB — BASIC METABOLIC PANEL
BUN: 15 mg/dL (ref 6–23)
CALCIUM: 8.5 mg/dL (ref 8.4–10.5)
CO2: 25 mEq/L (ref 19–32)
CREATININE: 0.97 mg/dL (ref 0.50–1.35)
Chloride: 99 mEq/L (ref 96–112)
Glucose, Bld: 149 mg/dL — ABNORMAL HIGH (ref 70–99)
Potassium: 3.4 mEq/L — ABNORMAL LOW (ref 3.7–5.3)
Sodium: 137 mEq/L (ref 137–147)

## 2013-07-23 LAB — CBC
HEMATOCRIT: 36.5 % — AB (ref 39.0–52.0)
Hemoglobin: 12.4 g/dL — ABNORMAL LOW (ref 13.0–17.0)
MCH: 29.9 pg (ref 26.0–34.0)
MCHC: 34 g/dL (ref 30.0–36.0)
MCV: 88 fL (ref 78.0–100.0)
Platelets: 213 10*3/uL (ref 150–400)
RBC: 4.15 MIL/uL — ABNORMAL LOW (ref 4.22–5.81)
RDW: 15.3 % (ref 11.5–15.5)
WBC: 7.7 10*3/uL (ref 4.0–10.5)

## 2013-07-23 LAB — GLUCOSE, CAPILLARY
GLUCOSE-CAPILLARY: 125 mg/dL — AB (ref 70–99)
Glucose-Capillary: 129 mg/dL — ABNORMAL HIGH (ref 70–99)
Glucose-Capillary: 110 mg/dL — ABNORMAL HIGH (ref 70–99)
Glucose-Capillary: 112 mg/dL — ABNORMAL HIGH (ref 70–99)
Glucose-Capillary: 96 mg/dL (ref 70–99)

## 2013-07-23 LAB — PROTIME-INR
INR: 1.5 — ABNORMAL HIGH (ref 0.00–1.49)
PROTHROMBIN TIME: 17.7 s — AB (ref 11.6–15.2)

## 2013-07-23 LAB — CULTURE, RESPIRATORY

## 2013-07-23 LAB — CULTURE, RESPIRATORY W GRAM STAIN

## 2013-07-23 LAB — HEPARIN LEVEL (UNFRACTIONATED)
HEPARIN UNFRACTIONATED: 0.54 [IU]/mL (ref 0.30–0.70)
HEPARIN UNFRACTIONATED: 0.53 [IU]/mL (ref 0.30–0.70)

## 2013-07-23 LAB — LACTATE DEHYDROGENASE: LDH: 1230 U/L — ABNORMAL HIGH (ref 94–250)

## 2013-07-23 MED ORDER — CLOPIDOGREL BISULFATE 75 MG PO TABS
75.0000 mg | ORAL_TABLET | Freq: Every day | ORAL | Status: DC
  Filled 2013-07-23: qty 1

## 2013-07-23 MED ORDER — POTASSIUM CHLORIDE 20 MEQ/15ML (10%) PO LIQD
20.0000 meq | ORAL | Status: AC
  Administered 2013-07-23 (×2): 20 meq
  Filled 2013-07-23 (×2): qty 15

## 2013-07-23 MED ORDER — FUROSEMIDE 10 MG/ML IJ SOLN
20.0000 mg | Freq: Once | INTRAMUSCULAR | Status: AC
  Administered 2013-07-23: 20 mg via INTRAVENOUS
  Filled 2013-07-23: qty 2

## 2013-07-23 MED ORDER — CLOPIDOGREL BISULFATE 75 MG PO TABS
75.0000 mg | ORAL_TABLET | Freq: Every day | ORAL | Status: DC
  Administered 2013-07-23 – 2013-07-25 (×2): 75 mg
  Filled 2013-07-23 (×5): qty 1

## 2013-07-23 MED ORDER — HEPARIN (PORCINE) IN NACL 100-0.45 UNIT/ML-% IJ SOLN
1200.0000 [IU]/h | INTRAMUSCULAR | Status: DC
  Administered 2013-07-23: 1250 [IU]/h via INTRAVENOUS
  Filled 2013-07-23 (×7): qty 250

## 2013-07-23 NOTE — Progress Notes (Signed)
[facility] ADULT ICU REPLACEMENT PROTOCOL FOR AM LAB REPLACEMENT ONLY  The patient does apply for the [facility] Adult ICU Electrolyte Replacment Protocol based on the criteria listed below:   1. Is GFR >/= 40 ml/min? yes  Patient's GFR today is >90 2. Is urine output >/= 0.5 ml/kg/hr for the last 6 hours? yes Patient's UOP is 0.5 ml/kg/hr 3. Is BUN < 60 mg/dL? yes  Patient's BUN today is 15 4. Abnormal electrolyte(s): Potassium 5. Ordered repletion with: Potassium per Protocol  [name] P 07/23/2013 3:46 AM

## 2013-07-23 NOTE — Progress Notes (Signed)
ANTICOAGULATION CONSULT NOTE - Follow Up Consult  Pharmacy Consult for heparin Indication: LVAD thrombosis/CVA  Allergies  Allergen Reactions  . Ace Inhibitors Cough  . Lexapro [Escitalopram Oxalate] Other (See Comments)    somnolence    Patient Measurements: Height: 5\' 4"  (162.6 cm) Weight: 188 lb (85.276 kg) IBW/kg (Calculated) : 59.2   Vital Signs: Temp: 98 F (36.7 C) (01/30 1536) Temp src: Oral (01/30 1536) Pulse Rate: 81 (01/30 1800)  Labs:  Recent Labs  07/21/13 0415  07/22/13 0414  07/22/13 2045 07/23/13 0245 07/23/13 1815  HGB 13.8  --  12.9*  --   --  12.4*  --   HCT 39.5  --  37.4*  --   --  36.5*  --   PLT 206  --  222  --   --  213  --   LABPROT 19.3*  --  18.2*  --   --  17.7*  --   INR 1.68*  --  1.55*  --   --  1.50*  --   HEPARINUNFRC 0.24*  < > 0.60  < > 0.42 0.54 0.53  CREATININE 1.17  --  1.10  --   --  0.97  --   < > = values in this interval not displayed.  Estimated Creatinine Clearance: 89.7 ml/min (by C-G formula based on Cr of 0.97).  . sodium chloride 20 mL/hr at 07/22/13 0005  . amiodarone (NEXTERONE PREMIX) 360 mg/200 mL dextrose 30 mg/hr (07/23/13 1545)  . heparin 1,250 Units/hr (07/23/13 1038)  . nitroGLYCERIN Stopped (07/17/13 1800)  . norepinephrine (LEVOPHED) Adult infusion 8 mcg/min (07/23/13 1600)    Assessment: 53 yo male with LVAD, suspected pump thomobosis, admitted for NSTEMI, then developed acute CVA prior to cath. Coumadin on hold due to NPO, and IV heparin started last night after sheath out from attempted IR procedure.    No bleeding noted per chart notes.  H/H trending down, but pltc fairly stable.  Heparin level is slightly supra-therapeutic s/p CVA.   Goal of Therapy:  Heparin level 0.3-0.5 Monitor platelets by anticoagulation protocol: Yes  Plan:  Decrease heparin to 1200 units/ hr  Follow up AM  Thank you. Okey Regal, PharmD [phone]   07/23/2013 6:51 PM

## 2013-07-23 NOTE — Procedures (Signed)
Extubation Procedure Note  Patient Details:   Name: [name] DOB: 03/24/1961 MRN: [id number]     Pt extubated to [facility] per MD order after successful SBT.  Pt tolerated extubation.       O2 sats: stable throughout Complications: No apparent complications Patient did tolerate procedure well. Bilateral Breath Sounds: Clear;Diminished Suctioning: Airway Yes  Rosemary Pentecost Apple 07/23/2013, 3:31 PM

## 2013-07-23 NOTE — Progress Notes (Signed)
Name: Eric LovelyUndra Eastwood MRN: [id number] DOB: 09/06/1960    ADMISSION DATE:  07/17/2013 CONSULTATION DATE:  1/26  REFERRING MD :  [name] PRIMARY SERVICE: Bensimhon  CHIEF COMPLAINT:  Vent weaning s/p cerebral arteriogram    BRIEF PATIENT DESCRIPTION:  This is a 53 year old male w/ known h/o NICM EF 10-15% w/ severe MR. [facility] as [facility] to transplant. Admitted for evaluation of new NSTEMI felt possibly to be related to pump thrombosis. Hosp course complicated by episodes of CP and hypotension on 1/24 pm hours. 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 PCCM asked to see post-op for vent weaning.    SIGNIFICANT EVENTS / STUDIES:  1/24: admitted w/ CP and trop rise. Did have sub-therapeutic INR at home so concern was for pump thrombosis.  1/24: evening had CP, rising Trop I, got mso4 and NTG>>>got hypotensive requiring pressors.  1/25 INR supra-therapeutic. Coumadin stopped, heparin started 1/26: CT head: negative for bleed.  1/26:cerebral arteriogram:  acute right MCA embolic CVA. Not amendable to extraction due to distal location.    LINES / TUBES: LVAD OETT 1/26>>> Right PICC 1/25>>>  CULTURES: Resp 1/27 >> few GNR >> normal flora  ANTIBIOTICS: 1/27 zosyn (aspiration, GNR) >>   SUBJECTIVE:  More awake Following commands, weak cough Reported apnea on PSV this am  VITAL SIGNS: Temp:  [97.5 F (36.4 C)-99.9 F (37.7 C)] 97.5 F (36.4 C) (01/30 0804) Pulse Rate:  [62-84] 76 (01/30 0900) Resp:  [14-21] 16 (01/30 0900) SpO2:  [95 %-100 %] 99 % (01/30 0900) FiO2 (%):  [30 %] 30 % (01/30 0804) Weight:  [85.276 kg (188 lb)] 85.276 kg (188 lb) (01/30 0433) HEMODYNAMICS: CVP:  [10 mmHg-14 mmHg] 10 mmHg VENTILATOR SETTINGS: Vent Mode:  [-] PRVC FiO2 (%):  [30 %] 30 % Set Rate:  [14 bmp] 14 bmp Vt Set:  [550 mL] 550 mL PEEP:  [5 cmH20] 5 cmH20 Pressure Support:  [10 cmH20] 10 cmH20 Plateau Pressure:   [17 cmH20-21 cmH20] 21 cmH20 INTAKE / OUTPUT: Intake/Output     01/29 0701 - 01/30 0700 01/30 0701 - 01/31 0700   I.V. (mL/kg) 1450.4 (17) 118.2 (1.4)   NG/GT 1107.5 180   IV Piggyback 125 25   Total Intake(mL/kg) 2682.9 (31.5) 323.2 (3.8)   Urine (mL/kg/hr) 740 (0.4) 100 (0.5)   Total Output 740 100   Net +1942.9 +223.2          PHYSICAL EXAMINATION: General:  53 year old aam currently orally intubated.  Neuro:  Eyes open, PERRL, follows commands, moves RUE and RLE HEENT:  Orally intubated.  Cardiovascular:  LVAD pump hum. No edema  Lungs:  Coarse scattered rhonchi  Abdomen:  Soft, nontender  Musculoskeletal:  Intact  Skin:  Intact  LABS:  CBC  Recent Labs Lab 07/21/13 0415 07/22/13 0414 07/23/13 0245  WBC 10.1 10.2 7.7  HGB 13.8 12.9* 12.4*  HCT 39.5 37.4* 36.5*  PLT 206 222 213   Coag's  Recent Labs Lab 07/21/13 0415 07/22/13 0414 07/23/13 0245  INR 1.68* 1.55* 1.50*   BMET  Recent Labs Lab 07/21/13 0415 07/22/13 0414 07/23/13 0245  NA 137 136* 137  K 3.4* 3.4* 3.4*  CL 99 100 99  CO2 22 23 25   BUN 11 12 15   CREATININE 1.17 1.10 0.97  GLUCOSE 179* 148* 149*   Electrolytes  Recent Labs Lab 07/18/13 0149  07/21/13 0415 07/22/13 0414  07/23/13 0245  CALCIUM 8.7  < > 8.3* 8.5 8.5  MG 1.9  --   --   --   --   < > = values in this interval not displayed. Sepsis Markers  Recent Labs Lab 07/20/13 1030 07/21/13 0415 07/22/13 0414  PROCALCITON 0.13 0.19 0.17   ABG  Recent Labs Lab 07/19/13 1301  PHART 7.476*  PCO2ART 36.1  PO2ART 210.0*   Liver Enzymes  Recent Labs Lab 07/17/13 0923 07/18/13 0149 07/19/13 0500  AST 152* 223* 471*  ALT 31 43 80*  ALKPHOS 87 98 85  BILITOT 0.6 0.4 1.8*  ALBUMIN 4.2 3.9 3.6   Cardiac Enzymes  Recent Labs Lab 07/17/13 0923 07/17/13 1525 07/17/13 2017  TROPONINI 12.76* 17.02* 19.35*  PROBNP 2538.0*  --   --    Glucose  Recent Labs Lab 07/22/13 0756 07/22/13 1211 07/22/13 1603  07/22/13 1930 07/22/13 2336 07/23/13 0341  GLUCAP 146* 142* 129* 130* 133* 125*    Imaging Dg Chest Port 1 View  07/22/2013   CLINICAL DATA:  Endotracheal tube placement. Central line placement.  EXAM: PORTABLE CHEST - 1 VIEW  COMPARISON:  07/21/2013  FINDINGS: Endotracheal tube in good position. Gastric tube in the stomach. Right arm PICC tip in the SVC at the cavoatrial junction. AICD unchanged. Left ventricular assist device unchanged.  Mild atelectasis in the lung bases has improved. Negative for edema or effusion.  IMPRESSION: Support lines remain in unchanged position.  Improved aeration with decreased bibasilar atelectasis.   Electronically Signed   By: Marlan Palau M.D.   On: 07/22/2013 08:18     CXR:  1/30 > slight progression B interstitial infiltrates  ASSESSMENT / PLAN:  PULMONARY A: acute respiratory failure  S/p acute right MCA CVA. Has copious airway secretions. Concerned about element of edema. Pulmonary exam had been unremarkable prior.  P:   Tolerating PSV 15 this am, goal extubate 1/30 as MS improves this am  CARDIOVASCULAR A:  NSTEMI (felt to be Pump thrombosis) Acute on Chronic systolic HF (EF 10-15%) w/ LVAD placed in march 2013 at [facility].  Cardiogenic shock  P  LVAD per cards, current flow rate 4.0 levophed for MAP >70, weaning Asa, plavix and heparin per cards  Single dose lasix 20mg  on 1/30  RENAL A:  AKI in setting of hypoperfusion, s/p contrast load for cerebral arteriogram.  P:   MAP goals >70 F/u chemistry  Renal dose meds   GASTROINTESTINAL A:   Rising LFTs in setting of on-going ischemia  At risk for dysphagia   P:   PPI for PUD  TFs Will need swallow eval after off vent   HEMATOLOGIC A:   Chronic anticoagulation (on coumadin for LVAD) Concern about acute pump related embolic events P:  Heparin per pharmacy   INFECTIOUS A:  ? aspiration pneumonia P:   F/u cxr, follow Continue zosyn, total abx course planned 8  days  ENDOCRINE A:  Mild hyperglycemia  P:   ssi if glucose > 150   NEUROLOGIC A:  Right MCA embolic CVA. Not amendable to IR  P:   Change propofol to prn versed + fentanyl recs per neuro   TODAY'S SUMMARY: continue SBTs, lighten sedation, wean norepi as able. Goal extubate  I have personally obtained a history, examined the patient, evaluated laboratory and imaging results, formulated the assessment and plan and placed orders.  CRITICAL CARE: The patient is critically ill with multiple organ systems failure and requires high complexity decision making for  assessment and support, frequent evaluation and titration of therapies, application of advanced monitoring technologies and extensive interpretation of multiple databases. Critical Care Time devoted to patient care services described in this note is 35 minutes.    Levy Pupa, MD, PhD 07/23/2013, 9:14 AM [facility] [phone] or if no answer [phone]

## 2013-07-23 NOTE — Progress Notes (Addendum)
Patient ID: [name], male   DOB: 10/24/1960, 53 y.o.   MRN: [id number] Advanced Heart Failure Rounding Note  [name] is 53 y/o male with a history of severe CHF due to NICM EF 10-15%, and severe MR s/p HM II [facility]. His medical history also consists of CRI, NSVT s/p ICD and LV thrombus.   Admitted 1/24 with NSTEMI felt secondary due embolic phenomenon due to pump thrombosis.  LDH high around 900.   On way to cath lab 1/26 developed acute onset of aphasia and left-sided hemiparesis. CT negative for bleed. Taken emergently to neuro-interventional suite but no amenable lesions. Remains intubated on levophed. Remains on propofol gtt, unable to assess neuro status.  S/P TEE 1/28. No evidence of clot but with RV failure.   Intubated. More awake this am. Follows commands. Levophed down to 9 mcg/min. Still can't move left side. PI drops at times. Back on heparin.  07/19/13:  INR 1.9>1.47>1.68>1.5> 1.5   LDH 976>843>1568>1693>1595>1357>1230  Co-ox 55.3>64.4>83>73>68>63  CVP 12  MAP's:  80-90s   VAD interrogated personally.  Flow 3.7 , Speed 9400 PI 4.4,  Power 5.0   Events: multiple PI events  Alarms: none     Filed Vitals:   07/23/13 0500 07/23/13 0600 07/23/13 0700 07/23/13 0804  BP:      Pulse: 71 67 69 62  Temp:    97.5 F (36.4 C)  TempSrc:    Axillary  Resp: 14 14 14 14   Height:      Weight:      SpO2: 100% 100% 100% 100%    Intake/Output Summary (Last 24 hours) at 07/23/13 0841 Last data filed at 07/23/13 0800  Gross per 24 hour  Intake 2762.55 ml  Output    800 ml  Net 1962.55 ml    LABS: Basic Metabolic Panel:  Recent Labs  78/29/5601/29/15 0414 07/23/13 0245  NA 136* 137  K 3.4* 3.4*  CL 100 99  CO2 23 25  GLUCOSE 148* 149*  BUN 12 15  CREATININE 1.10 0.97  CALCIUM 8.5 8.5   Liver Function Tests: No results found for this basename: AST, ALT, ALKPHOS, BILITOT, PROT, ALBUMIN,  in the last 72 hours No results found for this basename:  LIPASE, AMYLASE,  in the last 72 hours CBC:  Recent Labs  07/22/13 0414 07/23/13 0245  WBC 10.2 7.7  HGB 12.9* 12.4*  HCT 37.4* 36.5*  MCV 87.6 88.0  PLT 222 213   Cardiac Enzymes: No results found for this basename: CKTOTAL, CKMB, CKMBINDEX, TROPONINI,  in the last 72 hours RADIOLOGY: Dg Chest Portable 1 View  07/17/2013   CLINICAL DATA:  Left-sided chest pain for 3 days, weakness and dizziness.  EXAM: PORTABLE CHEST - 1 VIEW  COMPARISON:  08/03/2012  FINDINGS: Evidence of median sternotomy noted with LVAD in place. Left-sided defibrillator noted. Mild prominence of the cardiac silhouette persists. Tricuspid valvuloplasty reidentified. Trace left pleural fluid or thickening persists. No new pulmonary opacity.  IMPRESSION: No new acute abnormality. Stable appearance of trace left pleural fluid or thickening.   Electronically Signed   By: Christiana PellantGretchen  Green M.D.   On: 07/17/2013 10:12    PHYSICAL EXAM General: Intubated awake. interactive Neck: JVP to jaw Lungs: Clear to auscultation bilaterally with normal respiratory effort. CV: LVAD pump hum normal.  Trace edema.   Abdomen: Soft, nontender, no hepatosplenomegaly, no distention.  Neurologic: awake interactive. Expressive aphasia prior to intubation. L sided paralysis Extremities: No clubbing or cyanosis.  TELEMETRY: Reviewed telemetry pt in sinus tachy in 90-110   Assessment:   1. Acute embolic CVA 2. NSTEMI - Likely pump thrombosis with coronary embolus.  2. Chronic systolic HF s/p HM II VAD  3. NICM EF 15%  4. Hyperkalemia  5. HTN - MAP 70s currently  6. Acute respiratory failure 7. Shock - suspect mixed picture RV failure/vasodilatory  Plan/Discussion:    He has had NSTEMI and acute CVA due to pump thrombosis/embolism. Remains intubated but hopefully can be extubated today.Hemodynamically improved but still inotrope dependent due to R heart failure. Will continue to wean levophed as tolerated. Can diurese as needed to  help with extubation but would be careful as he is preload dependent with RV failure and VAD.   Repeat CT 1/26 without bleed. Continue heparin and ASA 325 PR. Resume Plavix today. I worry he will have severe residual deficits from his CVA.Given acute CVA will manage NSTEMI medically. No b-blocker due to shock.   AF is quiescent on IV amio.   As he recovers will need to consider resuming evaluation for pump exchange.   The patient is critically ill with multiple organ systems failure and requires high complexity decision making for assessment and support, frequent evaluation and titration of therapies, application of advanced monitoring technologies and extensive interpretation of multiple databases.   Critical Care Time devoted to patient care services described in this note is 40 Minutes.  Daniel Bensimhon,MD 8:45 AM

## 2013-07-23 NOTE — Progress Notes (Signed)
ANTICOAGULATION/ANTIBIOTIC - Follow Up Consult  Pharmacy Consult for Heparin + Zosyn Indication: LVAD thrombosis/CVA + aspiration pneumonia  Allergies  Allergen Reactions  . Ace Inhibitors Cough  . Lexapro [Escitalopram Oxalate] Other (See Comments)    somnolence    Patient Measurements: Height: 5\' 4"  (162.6 cm) Weight: 188 lb (85.276 kg) IBW/kg (Calculated) : 59.2 Heparin Dosing Weight: 77kg  Vital Signs: Temp: 97.5 F (36.4 C) (01/30 0804) Temp src: Axillary (01/30 0804) Pulse Rate: 62 (01/30 0804)  Labs:  Recent Labs  07/21/13 0415  07/22/13 0414 07/22/13 1700 07/22/13 2045 07/23/13 0245  HGB 13.8  --  12.9*  --   --  12.4*  HCT 39.5  --  37.4*  --   --  36.5*  PLT 206  --  222  --   --  213  LABPROT 19.3*  --  18.2*  --   --  17.7*  INR 1.68*  --  1.55*  --   --  1.50*  HEPARINUNFRC 0.24*  < > 0.60 1.21* 0.42 0.54  CREATININE 1.17  --  1.10  --   --  0.97  < > = values in this interval not displayed.  Estimated Creatinine Clearance: 89.7 ml/min (by C-G formula based on Cr of 0.97).   Medications:  Heparin @ 1300 units/hr  Assessment: 61yom with LVAD, suspected pump thrombosis, admitted for NSTEMI, then developed acute CVA 1/26 prior to cath. Coumadin on hold and IV heparin started after sheath out from attempted IR procedure. Heparin level this morning is slightly above goal at 0.54. CBC stable. No bleeding reported.  Also continues on day #4 zosyn for possible aspiration pneumonia. Renal function stable - dose appropriate. 1/27 Zosyn > 1/27 Resp Cx> Normal flora   Goal of Therapy:  Heparin level 0.3-0.5 units/ml Monitor platelets by anticoagulation protocol: Yes   Plan:  1) Decrease heparin to 1250 units/hr 2) Recheck heparin level in 6 hours 3) Continue zosyn 3.375g IV q8  Fredrik Rigger 07/23/2013,8:59 AM

## 2013-07-23 NOTE — Progress Notes (Signed)
Stroke Team Progress Note  HISTORY [name] is a 53 y.o. male left-handed male who presents with sudden onset left-sided weakness and aphasia. He was admitted 07/17/2013 with chest pain. He has a history of congestive heart failure and has a left ventricular assist device in place. His INR was low at home - his pump got clotted (likely causing MI), and he loaded himself and became supratherapeutic 4.97 in hospital. He was given vitamin K yesterday 07/18/2013, his INR came down to 1.9. He was then  started on heparin. Around 7:40 AM 07/19/2013, the nurse helped him to get dressed and stated that he seemed sleepy, he was moving his left side well. The patient endorses that he had symptoms start after he woke up this morning, but woke up in his normal state. He appears to understand well. Patient was not administerd TPA secondary to being on systemic heparin and INR 1.9  SUBJECTIVE Slowing tolerating ventilator wean. Alert and following commands  OBJECTIVE Most recent Vital Signs: Filed Vitals:   07/23/13 0500 07/23/13 0600 07/23/13 0700 07/23/13 0804  BP:      Pulse: 71 67 69 62  Temp:    97.5 F (36.4 C)  TempSrc:    Axillary  Resp: 14 14 14 14   Height:      Weight:      SpO2: 100% 100% 100% 100%   CBG (last 3)   Recent Labs  07/22/13 1930 07/22/13 2336 07/23/13 0341  GLUCAP 130* 133* 125*    IV Fluid Intake:   . sodium chloride 20 mL/hr at 07/22/13 0005  . amiodarone (NEXTERONE PREMIX) 360 mg/200 mL dextrose 30 mg/hr (07/23/13 0400)  . heparin 1,300 Units/hr (07/22/13 1814)  . nitroGLYCERIN Stopped (07/17/13 1800)  . norepinephrine (LEVOPHED) Adult infusion 10 mcg/min (07/23/13 0000)    MEDICATIONS  . allopurinol  100 mg Oral Daily  . antiseptic oral rinse  15 mL Mouth Rinse QID  . aspirin  300 mg Rectal Daily  . chlorhexidine  15 mL Mouth Rinse BID  . feeding supplement (PRO-STAT SUGAR FREE 64)  30 mL Per Tube TID  . feeding supplement (VITAL HIGH PROTEIN)  1,000 mL Per  Tube Q24H  . fentaNYL  100 mcg Intravenous Once  . insulin aspart  0-15 Units Subcutaneous Q4H  . magnesium oxide  400 mg Oral Daily  . pantoprazole sodium  40 mg Per Tube Q1200  . piperacillin-tazobactam (ZOSYN)  IV  3.375 g Intravenous Q8H  . sodium chloride  10-40 mL Intracatheter Q12H   PRN:  acetaminophen, fentaNYL, midazolam, nitroGLYCERIN, ondansetron (ZOFRAN) IV  Diet:  NPO , tube feedings Activity:  Bedrest DVT Prophylaxis:  IV heparin  CLINICALLY SIGNIFICANT STUDIES Basic Metabolic Panel:   Recent Labs Lab 07/18/13 0149  07/22/13 0414 07/23/13 0245  NA 135*  < > 136* 137  K 4.8  < > 3.4* 3.4*  CL 97  < > 100 99  CO2 20  < > 23 25  GLUCOSE 184*  < > 148* 149*  BUN 16  < > 12 15  CREATININE 1.40*  < > 1.10 0.97  CALCIUM 8.7  < > 8.5 8.5  MG 1.9  --   --   --   < > = values in this interval not displayed. Liver Function Tests:   Recent Labs Lab 07/18/13 0149 07/19/13 0500  AST 223* 471*  ALT 43 80*  ALKPHOS 98 85  BILITOT 0.4 1.8*  PROT 8.4* 7.6  ALBUMIN 3.9 3.6  CBC:  Recent Labs Lab 07/17/13 0923 07/18/13 0149  07/22/13 0414 07/23/13 0245  WBC 5.7 7.7  < > 10.2 7.7  NEUTROABS 3.8 6.1  --   --   --   HGB 17.3* 16.5  < > 12.9* 12.4*  HCT 49.5 48.0  < > 37.4* 36.5*  MCV 87.9 88.6  < > 87.6 88.0  PLT 269 263  < > 222 213  < > = values in this interval not displayed. Coagulation:   Recent Labs Lab 07/20/13 0800 07/21/13 0415 07/22/13 0414 07/23/13 0245  LABPROT 19.0* 19.3* 18.2* 17.7*  INR 1.64* 1.68* 1.55* 1.50*   Cardiac Enzymes:   Recent Labs Lab 07/17/13 0923 07/17/13 1525 07/17/13 2017  TROPONINI 12.76* 17.02* 19.35*   Urinalysis: No results found for this basename: COLORURINE, APPERANCEUR, LABSPEC, PHURINE, GLUCOSEU, HGBUR, BILIRUBINUR, KETONESUR, PROTEINUR, UROBILINOGEN, NITRITE, LEUKOCYTESUR,  in the last 168 hours Lipid Panel    Component Value Date/Time   CHOL 166 07/20/2013 0020   TRIG 96 07/20/2013 0020   HDL 63  07/20/2013 0020   CHOLHDL 2.6 07/20/2013 0020   VLDL 19 07/20/2013 0020   LDLCALC 84 07/20/2013 0020   HgbA1C  Lab Results  Component Value Date   HGBA1C 5.7* 07/19/2013    Urine Drug Screen:     Component Value Date/Time   LABOPIA NONE DETECTED 06/21/2008 1300   COCAINSCRNUR NONE DETECTED 06/21/2008 1300   LABBENZ NONE DETECTED 06/21/2008 1300   AMPHETMU NONE DETECTED 06/21/2008 1300   THCU NONE DETECTED 06/21/2008 1300   LABBARB  Value: NONE DETECTED        DRUG SCREEN FOR MEDICAL PURPOSES ONLY.  IF CONFIRMATION IS NEEDED FOR ANY PURPOSE, NOTIFY LAB WITHIN 5 DAYS. 06/21/2008 1300    Alcohol Level: No results found for this basename: ETH,  in the last 168 hours   CT of the brain   07/20/2013 1. New hypodensities in the left basal ganglia and left thalamus,  concerning for evolving acute infarcts. Smaller foci of hypoattenuation in the right caudate and thalamus may also represent acute ischemia.  2. New paranasal sinus mucosal disease. 07/19/2013    1. No acute intracranial process. 2. Very minimal amount of geographic subcortical encephalomalacia within in the anterior lateral aspect of the left temporal lobe, at the site of the patient's prior hemorrhagic contusion.   Cerebral Angiogram 07/20/2013 Rt CFa approach. Distal small filling defects in the the orbitofrontal br of RT MCA sup division,and tertiary angular division of RT MCA  MRI of the brain    2D Echocardiogram  EF 10%. Diffuse kypokinesis  TEE 07/21/2013 no evidence of clot but with RV failure.   CXR     07/22/2013 Support lines remain in unchanged position. Improved aeration with decreased bibasilar atelectasis.  07/21/2013 1. Stable positioning of support apparatus. No pneumothorax. 2. Pulmonary venous congestion without frank evidence of edema. 3. Slight worsening of perihilar and bibasilar opacities, left greater than right, likely atelectasis.  07/20/2013 1. Repositioning of endotracheal tube, tip 4.5 cm of carina. 2.  Improving left lower lobe atelectasis. 07/19/2013    Nasogastric tube extends into the stomach.    07/19/2013    Endotracheal tube terminates 4 cm above the carina.  Additional support apparatus as above.  07/18/2013  Right subclavian suspect right-sided PICC line tip at cavoatrial junction without pneumothorax.     EKG  sinus tachycardia. For complete results please see formal report.   Therapy Recommendations   Physical Exam   Young african  american male sedated and intubated. . Afebrile. Head is nontraumatic. Neck is supple without bruit.. Cardiac exam no murmur or gallop. Lungs are clear to auscultation. Distal pulses are well felt. LVAD right side abdomen Neurological Exam : Awake alert and follows commands well. Pupils 3 mm sluggishly reactive. Doll's eye movements are sluggish.Eyes partially open and will blink to threat and follow gaze. No facial grimacing to pain. No spontaneous extremity movements. Moves left side greater than right to painful stimuli.Follows commands on right side Left plantar is equivocal right is upgoing.   ASSESSMENT [name] is a 53 y.o. male presenting with left sided weakness.  Imaging supports left basal ganglia and thalamic infarcts larger than right basal ganglia subcortical  infarcts. Infarcts felt to be embolic secondary to either atrial fibrillation, LVAD clot and/or acute MI.  On aspirin 81 mg orally every day, clopidogrel 75 mg orally every day and warfarin prior to admission. Now on aspirin suppositories and intravenous heparin for secondary stroke prevention. Patient with resultant left arm hemiplegia, left leg hemiparesis, dysphagia, VDRF.   Systolic HF, EF 10%, has LVAD  new onset atrial fibrillation with RVR, on amiodarone drip, IV heparin -    NSTEMI - Elevated troponins - will manage medically hypertension Chronic RI Hyperlipidemia, LDL 84, on no statin PTA, now on no statin, at goal LDL < 100  obstructive sleep apnea Hx  migraines Bipolar affective disorder TEE performed 07/21/2013. Ejection fraction 15-20%. No obvious clot was noted.  Hospital day # 6  TREATMENT/PLAN  Continue aspirin 81 mg orally every day and heparin for secondary stroke prevention.   Extubate when tolerated  Physical and occupational therapies.  D/w patient and family  I have personally examined this patient, reviewed pertinent data, and developed plan of care and agrees with above Delia Heady, MD

## 2013-07-23 NOTE — Progress Notes (Signed)
Pt having copious secretions, audible stridous/congestion and has no existent to weak cough.  Sherrilyn Rist, RN called elink to express concern.  Dr. Marry Guan to bedside.  He stated he did not feel like pt was having stridor.  He advised to continue to monitor pt.  Will continue to monitor.

## 2013-07-24 ENCOUNTER — Inpatient Hospital Stay (HOSPITAL_COMMUNITY): Payer: Medicare Other

## 2013-07-24 DIAGNOSIS — R579 Shock, unspecified: Secondary | ICD-10-CM

## 2013-07-24 LAB — BASIC METABOLIC PANEL
BUN: 13 mg/dL (ref 6–23)
CO2: 25 mEq/L (ref 19–32)
Calcium: 8.6 mg/dL (ref 8.4–10.5)
Chloride: 100 mEq/L (ref 96–112)
Creatinine, Ser: 1.05 mg/dL (ref 0.50–1.35)
GFR, EST NON AFRICAN AMERICAN: 81 mL/min — AB (ref >90)
Glucose, Bld: 111 mg/dL — ABNORMAL HIGH (ref 70–99)
POTASSIUM: 3.7 meq/L (ref 3.7–5.3)
SODIUM: 137 meq/L (ref 137–147)

## 2013-07-24 LAB — GLUCOSE, CAPILLARY
GLUCOSE-CAPILLARY: 99 mg/dL (ref 70–99)
GLUCOSE-CAPILLARY: 114 mg/dL — AB (ref 70–99)
Glucose-Capillary: 102 mg/dL — ABNORMAL HIGH (ref 70–99)
Glucose-Capillary: 102 mg/dL — ABNORMAL HIGH (ref 70–99)
Glucose-Capillary: 86 mg/dL (ref 70–99)

## 2013-07-24 LAB — CBC
HCT: 35.5 % — ABNORMAL LOW (ref 39.0–52.0)
Hemoglobin: 11.9 g/dL — ABNORMAL LOW (ref 13.0–17.0)
MCH: 29.8 pg (ref 26.0–34.0)
MCHC: 33.5 g/dL (ref 30.0–36.0)
MCV: 88.8 fL (ref 78.0–100.0)
Platelets: 232 10*3/uL (ref 150–400)
RBC: 4 MIL/uL — ABNORMAL LOW (ref 4.22–5.81)
RDW: 15.2 % (ref 11.5–15.5)
WBC: 7.9 10*3/uL (ref 4.0–10.5)

## 2013-07-24 LAB — CARBOXYHEMOGLOBIN
Carboxyhemoglobin: 1.9 % — ABNORMAL HIGH (ref 0.5–1.5)
Methemoglobin: 1.6 % — ABNORMAL HIGH (ref 0.0–1.5)
O2 Saturation: 69.4 %
TOTAL HEMOGLOBIN: 11.2 g/dL — AB (ref 13.5–18.0)

## 2013-07-24 LAB — PROTIME-INR
INR: 1.49 (ref 0.00–1.49)
Prothrombin Time: 17.6 seconds — ABNORMAL HIGH (ref 11.6–15.2)

## 2013-07-24 LAB — LACTATE DEHYDROGENASE: LDH: 1220 U/L — ABNORMAL HIGH (ref 94–250)

## 2013-07-24 LAB — HEPARIN LEVEL (UNFRACTIONATED): Heparin Unfractionated: 0.33 IU/mL (ref 0.30–0.70)

## 2013-07-24 MED ORDER — FUROSEMIDE 10 MG/ML IJ SOLN
20.0000 mg | Freq: Once | INTRAMUSCULAR | Status: AC
  Administered 2013-07-24: 20 mg via INTRAVENOUS
  Filled 2013-07-24: qty 2

## 2013-07-24 MED ORDER — POTASSIUM CHLORIDE 10 MEQ/50ML IV SOLN
10.0000 meq | INTRAVENOUS | Status: AC
  Administered 2013-07-24 (×2): 10 meq via INTRAVENOUS
  Filled 2013-07-24 (×2): qty 50

## 2013-07-24 MED ORDER — SODIUM CHLORIDE 0.9 % IV SOLN
80.0000 mg | Freq: Once | INTRAVENOUS | Status: AC
  Administered 2013-07-24: 80 mg via INTRAVENOUS
  Filled 2013-07-24 (×2): qty 80

## 2013-07-24 NOTE — Progress Notes (Signed)
Pulmonary/Critical Care Progress Note   Name: [name] MRN: [id number] DOB: July 16, 1960    ADMISSION DATE:  07/17/2013 CONSULTATION DATE:  1/26  REFERRING MD :  Gala Romney PRIMARY SERVICE: Bensimhon  CHIEF COMPLAINT:  Vent weaning s/p cerebral arteriogram    BRIEF PATIENT DESCRIPTION:  This is a 53 year old male w/ known h/o NICM EF 10-15% w/ severe MR. He is s/p LVAD placement at [facility] March 2013, as bridge to transplant. Admitted for evaluation of new NSTEMI felt possibly to be related to pump thrombosis. Hosp course complicated by episodes of CP and hypotension on 1/24 pm hours. 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 PCCM asked to see post-op for vent weaning.    SIGNIFICANT EVENTS / STUDIES:  1/24: admitted w/ CP and trop rise. Did have sub-therapeutic INR at home so concern was for pump thrombosis.  1/24: evening had CP, rising Trop I, got mso4 and NTG>>>got hypotensive requiring pressors.  1/25 INR supra-therapeutic. Coumadin stopped, heparin started 1/26: CT head: negative for bleed.  1/26:cerebral arteriogram:  acute right MCA embolic CVA. Not amendable to extraction due to distal location.  1/30 Extubated but airway protection is questionable at best  LINES / TUBES: LVAD OETT 1/26>>>1/30 Right PICC 1/25>>>  CULTURES: Resp 1/27 >> few GNR >> normal flora  ANTIBIOTICS: 1/27 zosyn (aspiration, GNR) >>   SUBJECTIVE:  More awake this AM but not speaking or coughing to command  VITAL SIGNS: Temp:  [97.9 F (36.6 C)-99.1 F (37.3 C)] 98.4 F (36.9 C) (01/31 0726) Pulse Rate:  [70-87] 80 (01/31 0600) Resp:  [14-28] 23 (01/31 0600) SpO2:  [93 %-100 %] 93 % (01/31 0600) FiO2 (%):  [30 %] 30 % (01/30 1400) Weight:  [87 kg (191 lb 12.8 oz)] 87 kg (191 lb 12.8 oz) (01/31 0500) HEMODYNAMICS: CVP:  [13 mmHg-19 mmHg] 16 mmHg VENTILATOR SETTINGS: [place] INTAKE / OUTPUT: Intake/Output     01/30 0701 - 01/31 0700 01/31 0701 - 02/01 0700    I.V. (mL/kg) 1353.9 (15.6) 54.3 (0.6)   NG/GT 520    IV Piggyback 262.5    Total Intake(mL/kg) 2136.4 (24.6) 54.3 (0.6)   Urine (mL/kg/hr) 1500 (0.7)    Total Output 1500     Net +636.4 +54.3        Stool Occurrence 1 x      PHYSICAL EXAMINATION: General:  53 year old aam currently extubated but not speaking Neuro:  Eyes open, PERRL, follows commands, moves RUE and RLE, hemiplegic on left HEENT:  [place]/AT, PERRL, EOM-I and MMM.  Cardiovascular:  LVAD pump hum. No edema  Lungs:  Coarse scattered rhonchi  Abdomen:  Soft, nontender  Musculoskeletal:  Intact  Skin:  Intact  LABS:  CBC  Recent Labs Lab 07/22/13 0414 07/23/13 0245 07/24/13 0317  WBC 10.2 7.7 7.9  HGB 12.9* 12.4* 11.9*  HCT 37.4* 36.5* 35.5*  PLT 222 213 232   Coag's  Recent Labs Lab 07/22/13 0414 07/23/13 0245 07/24/13 0317  INR 1.55* 1.50* 1.49   BMET  Recent Labs Lab 07/22/13 0414 07/23/13 0245 07/24/13 0317  NA 136* 137 137  K 3.4* 3.4* 3.7  CL 100 99 100  CO2 23 25 25   BUN 12 15 13   CREATININE 1.10 0.97 1.05  GLUCOSE 148* 149* 111*   Electrolytes  Recent Labs Lab 07/18/13 0149  07/22/13 0414 07/23/13 0245 07/24/13 0317  CALCIUM 8.7  < > 8.5 8.5 8.6  MG 1.9  --   --   --   --   < > =  values in this interval not displayed. Sepsis Markers  Recent Labs Lab 07/20/13 1030 07/21/13 0415 07/22/13 0414  PROCALCITON 0.13 0.19 0.17   ABG  Recent Labs Lab 07/19/13 1301  PHART 7.476*  PCO2ART 36.1  PO2ART 210.0*   Liver Enzymes  Recent Labs Lab 07/18/13 0149 07/19/13 0500  AST 223* 471*  ALT 43 80*  ALKPHOS 98 85  BILITOT 0.4 1.8*  ALBUMIN 3.9 3.6   Cardiac Enzymes  Recent Labs Lab 07/17/13 1525 07/17/13 2017  TROPONINI 17.02* 19.35*   Glucose  Recent Labs Lab 07/23/13 1224 07/23/13 1538 07/23/13 2017 07/24/13 0033 07/24/13 0407 07/24/13 0723  GLUCAP 110* 112* 96 102* 99 114*    Imaging Dg Chest Port 1 View  07/24/2013   CLINICAL DATA:   Interstitial infiltrates  EXAM: PORTABLE CHEST - 1 VIEW  COMPARISON:  the previous day's study  FINDINGS: The patient has been extubated and the nasogastric tube removed. Right arm PICC and left subclavian AICD stable. LVAD partially seen. Previous median sternotomy. Stable mild cardiomegaly. Mild bibasilar interstitial and alveolar opacities, stable since previous exam. Suspect small left pleural effusion versus pleural scarring. Marland Kitchen.  IMPRESSION: 1. Extubation with slightly improved aeration, persistent bibasilar opacities as above. 2. Stable cardiomegaly. 3. Remaining Support hardware stable in position.   Electronically Signed   By: Oley Balmaniel  Hassell M.D.   On: 07/24/2013 07:48   Dg Chest Port 1 View  07/23/2013   CLINICAL DATA:  Evaluate ET tube placement, central venous catheters  EXAM: PORTABLE CHEST - 1 VIEW  COMPARISON:  DG CHEST 1V PORT dated 07/22/2013  FINDINGS: Low lung volumes. Endotracheal tube level clavicles. Adequately positioned. Left chest wall AICD unit appreciated tip projecting region left ventricle. Left ventricular assist device partially visualized unchanged. There is mild prominence of the interstitial markings accentuated by the low lung volumes. Increased density projects in the left lung base, mild. May be secondary to positioning. The patient is tilted to the left. No further focal regions of consolidation or focal infiltrates appreciated. A right-sided central venous catheter is appreciated with tip projecting in the region of the superior vena cava. An NG tube is seen with tip not on the view of this study.  IMPRESSION: Low lung volumes. Support lines and tubes unchanged when compared to previous study. Findings which may represent atelectasis possibly mild infiltrate left lung base evaluation is limited secondary to patient positioning.   Electronically Signed   By: Salome HolmesHector  Cooper M.D.   On: 07/23/2013 07:58     CXR:  1/30 > slight progression B interstitial  infiltrates  ASSESSMENT / PLAN:  PULMONARY A: acute respiratory failure  S/p acute right MCA CVA. Has copious airway secretions. Concerned about element of edema. Pulmonary exam had been unremarkable prior.  P:   - Extubated but airway protection is questionable at best. - IS per RT protocol. - Flutter valve per RT protocol. - Titrate O2 for sat of 88-92%. - PT/OT.  CARDIOVASCULAR A:  NSTEMI (felt to be Pump thrombosis) Acute on Chronic systolic HF (EF 10-15%) w/ [facility].  Cardiogenic shock  P - LVAD per cards, current flow rate 4.0 - Levophed for MAP >70, weaning down, goal is to 3 mcg by the end of the day. - Asa, plavix and heparin per cards. - Single dose of lasix as ordered.  RENAL A:  AKI in setting of hypoperfusion, s/p contrast load for cerebral arteriogram.  P:   - MAP goals >65 -  F/u chemistry  - Renal dose meds  - Lasix as ordered  GASTROINTESTINAL A:   Rising LFTs in setting of on-going ischemia  At risk for dysphagia   P:   - PPI for PUD. - Swallow evaluation today, if fails then insert NGT and begin TF. - Will need swallow eval after off vent.  HEMATOLOGIC A:   Chronic anticoagulation (on coumadin for LVAD) Concern about acute pump related embolic events P:  - Heparin per pharmacy.  INFECTIOUS A:  ? aspiration pneumonia P:   - F/u cxr, follow. - Continue zosyn, total abx course planned 8 days (5/8).  ENDOCRINE A:  Mild hyperglycemia  P:   - ssi if glucose > 150.  NEUROLOGIC A:  Right MCA embolic CVA. Not amendable to IR  P:   - D/C all sedation. - Further recommendations per neuro.  TODAY'S SUMMARY: Extubated, airway protection is questionable at best, NTS frequently, need to address with family if fails due to airway protection would they want trach/peg given neuro and cardiac status.  I have personally obtained a history, examined the patient, evaluated laboratory and imaging results, formulated the  assessment and plan and placed orders.  CRITICAL CARE: The patient is critically ill with multiple organ systems failure and requires high complexity decision making for assessment and support, frequent evaluation and titration of therapies, application of advanced monitoring technologies and extensive interpretation of multiple databases. Critical Care Time devoted to patient care services described in this note is 35 minutes.   Alyson Reedy, M.D. [facility] Pulmonary/Critical Care Medicine. Pager: [phone]. After hours pager: [phone].

## 2013-07-24 NOTE — Progress Notes (Signed)
ANTICOAGULATION CONSULT NOTE - Follow Up Consult  Pharmacy Consult for heparin Indication: LVAD thrombosis/CVA  Allergies  Allergen Reactions  . Ace Inhibitors Cough  . Lexapro [Escitalopram Oxalate] Other (See Comments)    somnolence    Patient Measurements: Height: 5\' 4"  (162.6 cm) Weight: 191 lb 12.8 oz (87 kg) IBW/kg (Calculated) : 59.2   Vital Signs: Temp: 98.4 F (36.9 C) (01/31 0726) Temp src: Oral (01/31 0726) Pulse Rate: 80 (01/31 0600)  Labs:  Recent Labs  07/22/13 0414  07/23/13 0245 07/23/13 1815 07/24/13 0317  HGB 12.9*  --  12.4*  --  11.9*  HCT 37.4*  --  36.5*  --  35.5*  PLT 222  --  213  --  232  LABPROT 18.2*  --  17.7*  --  17.6*  INR 1.55*  --  1.50*  --  1.49  HEPARINUNFRC 0.60  < > 0.54 0.53 0.33  CREATININE 1.10  --  0.97  --  1.05  < > = values in this interval not displayed.  Estimated Creatinine Clearance: 83.7 ml/min (by C-G formula based on Cr of 1.05).  . sodium chloride 20 mL/hr at 07/22/13 0005  . amiodarone (NEXTERONE PREMIX) 360 mg/200 mL dextrose 30 mg/hr (07/24/13 0400)  . heparin 1,200 Units/hr (07/23/13 1850)  . nitroGLYCERIN Stopped (07/17/13 1800)  . 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 Coumadin on hold due to NPO with IV heparin bridge. INR 1.49. Heparin level therapeutic (0.33). No bleeding noted. Hgb down a bit, plt stable.  Goal of Therapy:  Heparin level 0.3-0.5 Monitor platelets by anticoagulation protocol: Yes  Plan:  1) Continue heparin at 1200/hr.  2) F/u daily heparin level, CBC 3) F/u swallow eval and ability to restart po coumadin  Christoper Fabian, PharmD, BCPS Clinical pharmacist, pager [phone]  07/24/2013 11:11 AM

## 2013-07-24 NOTE — Progress Notes (Signed)
Attempted NG tube using 12FR NG TUBE (small bore per CCM MD) twice, with each attempt patient has copious thick bloody oral secretions (patient has had thick secretions).  Could not pass NG tube due to copious secretions and patient being risk for aspiration.  Patient has a weak cough and cannot clear his secretions most of the time.  CCM MD notified that tube (whether NG or Enteric Small Bore) should be placed under FLUORO.

## 2013-07-24 NOTE — Plan of Care (Signed)
Problem: SLP Language Goals Goal: Patient will communicate needs/wants with Via multi-modalities

## 2013-07-24 NOTE — Progress Notes (Signed)
Patient ID: [name], male   DOB: Dec 10, 1960, 53 y.o.   MRN: [id number] Advanced Heart Failure Rounding Note  [name] is 53 y/o male with a history of severe CHF due to NICM EF 10-15%, and severe MR s/p HM II LVAD placed at [facility] March 2013. His medical history also consists of CRI, NSVT s/p ICD and LV thrombus.   Admitted 1/24 with NSTEMI felt secondary due embolic phenomenon due to pump thrombosis.  LDH high around 900.   On way to cath lab 1/26 developed acute onset of aphasia and left-sided hemiparesis. CT negative for bleed. Taken emergently to neuro-interventional suite but no amenable lesions. Remains intubated on levophed. Remains on propofol gtt, unable to assess neuro status.  S/P TEE 1/28. No evidence of clot but with RV failure.   Extubated 1/30. Wide awake this am. Remains aphasic and completely plegic on L side. Struggling with secretions.. Follows commands. Levophed down to 6 mcg/min. PIs much more stable.  07/19/13:  INR 1.9>1.47>1.68>1.5> 1.5 >1.5  LDH 976>843>1568>1693>1595>1357>1230>1220  Co-ox 55.3>64.4>83>73>68>63>69  CVP 12  MAP's:  80-90s   VAD interrogated personally.  Flow 4.3 , Speed 9400 PI 7.1,  Power 5.2   Events: multiple PI events  Alarms: none     Filed Vitals:   07/24/13 0400 07/24/13 0500 07/24/13 0600 07/24/13 0726  BP:      Pulse: 81 78 80   Temp: 98.2 F (36.8 C)   98.4 F (36.9 C)  TempSrc: Oral   Oral  Resp: 23 22 23    Height:      Weight:  87 kg (191 lb 12.8 oz)    SpO2: 98% 99% 93%     Intake/Output Summary (Last 24 hours) at 07/24/13 0835 Last data filed at 07/24/13 0800  Gross per 24 hour  Intake 1979.12 ml  Output   1400 ml  Net 579.12 ml    LABS: Basic Metabolic Panel:  Recent Labs  67/61/95 0245 07/24/13 0317  NA 137 137  K 3.4* 3.7  CL 99 100  CO2 25 25  GLUCOSE 149* 111*  BUN 15 13  CREATININE 0.97 1.05  CALCIUM 8.5 8.6   Liver Function Tests: No results found for this basename: AST, ALT, ALKPHOS,  BILITOT, PROT, ALBUMIN,  in the last 72 hours No results found for this basename: LIPASE, AMYLASE,  in the last 72 hours CBC:  Recent Labs  07/23/13 0245 07/24/13 0317  WBC 7.7 7.9  HGB 12.4* 11.9*  HCT 36.5* 35.5*  MCV 88.0 88.8  PLT 213 232   Cardiac Enzymes: No results found for this basename: CKTOTAL, CKMB, CKMBINDEX, TROPONINI,  in the last 72 hours RADIOLOGY: Dg Chest Portable 1 View  07/17/2013   CLINICAL DATA:  Left-sided chest pain for 3 days, weakness and dizziness.  EXAM: PORTABLE CHEST - 1 VIEW  COMPARISON:  08/03/2012  FINDINGS: Evidence of median sternotomy noted with LVAD in place. Left-sided defibrillator noted. Mild prominence of the cardiac silhouette persists. Tricuspid valvuloplasty reidentified. Trace left pleural fluid or thickening persists. No new pulmonary opacity.  IMPRESSION: No new acute abnormality. Stable appearance of trace left pleural fluid or thickening.   Electronically Signed   By: Christiana Pellant M.D.   On: 07/17/2013 10:12    PHYSICAL EXAM General: Extubated awake. Aphasic Neck: JVP to jaw Lungs: Clear to auscultation bilaterally with normal respiratory effort. CV: LVAD pump hum normal.  Trace edema.   Abdomen: Soft, nontender, no hepatosplenomegaly, no distention.  Neurologic: awake interactive. Expressive aphasia.  L sided paralysis Extremities: No clubbing or cyanosis.   TELEMETRY: Reviewed telemetry pt in sinus tachy in 80-90   Assessment:   1. Acute embolic CVA 2. NSTEMI - Likely pump thrombosis with coronary embolus.  2. Chronic systolic HF s/p HM II VAD  3. NICM EF 15%  4. Hyperkalemia  5. HTN - MAP 70s currently  6. Acute respiratory failure 7. Shock - suspect mixed picture RV failure/vasodilatory  Plan/Discussion:    He has had NSTEMI and acute CVA due to pump thrombosis/embolism. Now extubated. Remains aphasic and densely plegic on left. Hemodynamically improved but still inotrope dependent due to R heart failure. Will  continue to wean levophed as tolerated. Can diurese carefully as he is preload dependent with RV failure and VAD.   Will consult PT/OT and speech suspect he will fail swallow study.  Repeat CT 1/26 without bleed. Continue heparin and ASA 325 PR. Resume Plavix once Panda in.  I worry he will have severe residual deficits from his CVA.Given acute CVA will manage NSTEMI medically. No b-blocker due to shock.   AF is quiescent on IV amio.   As he recovers will need to consider resuming evaluation for pump exchange.   The patient is critically ill with multiple organ systems failure and requires high complexity decision making for assessment and support, frequent evaluation and titration of therapies, application of advanced monitoring technologies and extensive interpretation of multiple databases.   Critical Care Time devoted to patient care services described in this note is 35 Minutes.  Truman Haywardaniel Bensimhon,MD 8:35 AM

## 2013-07-24 NOTE — Evaluation (Signed)
Clinical/Bedside Swallow Evaluation Patient Details  Name: [name] MRN: [id number] Date of Birth: 07/25/1960  Today's Date: 07/24/2013 Time: 1040-1100 SLP Time Calculation (min): 20 min  Past Medical History:  Past Medical History  Diagnosis Date  . CHF (congestive heart failure)     EF- 10-15  . Medically noncompliant   . Mitral regurgitation   . Tobacco user   . HTN (hypertension)   . AICD (automatic cardioverter/defibrillator) present   . GERD (gastroesophageal reflux disease)   . Substance abuse   . Chronic renal insufficiency   . Syncope   . Thrombus 08/06/2010  . SYSTOLIC HEART FAILURE, CHRONIC 09/22/2008    Qualifier: Diagnosis of  By: Eric RomneyBensimhon, MD, [name]   . LV (left ventricular) mural thrombus 01/28/2011  . ICD - IN SITU 09/16/2008    Qualifier: Diagnosis of  By: Eric AmisBednar, NP-C, [name]    . MITRAL STENOSIS/ INSUFFICIENCY, NON-RHEUMATIC 09/22/2008    Qualifier: Diagnosis of  By: Eric RomneyBensimhon, MD, [name]   . Hepatomegaly 09/16/2008    Qualifier: Diagnosis of  By: Eric AmisBednar, NP-C, [name]    . High cholesterol 02/26/2012    "at one time"  . Sleep apnea   . Exertional dyspnea 02/26/2012  . History of blood transfusion 08/2011    "when I had heart pump"  . Migraines   . COMMON MIGRAINE 06/14/2009    Qualifier: Diagnosis of  By: Eric RuizJohn MD, [name]   . History of gout 02/26/2012  . Depression   . Bipolar affective disorder 10/22/2011    pt denies this hx 02/26/2012   Past Surgical History:  Past Surgical History  Procedure Laterality Date  . Cardiac defibrillator placement  ~ 2008  . Left ventricular assist device  08/2011  . Radiology with anesthesia N/A 07/19/2013    Procedure: RADIOLOGY WITH ANESTHESIA;  Surgeon: Eric GroutSanjeev K Deveshwar, MD;  Location: [facility] OR;  Service: Radiology;  Laterality: N/A;   HPI:  This is a 53 year old male [name]/ known h/o NICM EF 10-15% [name]/ severe MR. [facility] as [facility] to transplant. Admitted for evaluation of  new NSTEMI felt possibly to be related to pump thrombosis. Hosp course complicated by episodes of CP and hypotension on 1/24 pm hours. Developed acute right MCA CVA on 1/26 which was not amendable to intervention.Intubated 1/26-1/30. CXR 1/30 with slight progression of bilateral interstitial infiltrates. Currently with ? aspiration PNA. Head CT 1/26-New hypodensities in the left basal ganglia and left thalamus,concerning for evolving acute infarcts. Smaller foci of hypoattenuation in the right caudate and thalamus may also represent acute ischemia.   Assessment / Plan / Recommendation Clinical Impression  Patient presents with a severe motor based oropharyngeal dysphagia with cranial nerve involvement significantly limiting oral strength and ROM. Greater labial than lingual movement noted which at best remains minimal. Very limited mandibular depression despite SLP providing verbal, visual, and tactile cueing. Oral care complete. Dry swallows initiated spontaneously and with cueing with maximum effort. Vocal quality largely aphonic, wet,  indicative of poor airway protection. Oral abilities not functional for po trials at this time. Recommend NPO with strict oral care per protocol (Q4 at minimum) and temporary non-oral means of nutrition. SLP will f/u closely for functional recovery and ability to consume diagnostic po trials.     Aspiration Risk  Severe    Diet Recommendation NPO;Alternative means - temporary   Medication Administration: Via alternative means    Other  Recommendations Oral Care  Recommendations: Oral care Q4 per protocol   Follow Up Recommendations  Inpatient Rehab    Frequency and Duration min 3x week  2 weeks   Pertinent Vitals/Pain n/a     Swallow Study    General HPI: This is a 53 year old male [name]/ known h/o NICM EF 10-15% [name]/ severe MR. He is s/p LVAD placement at [facility] March 2013, as bridge to transplant. Admitted for evaluation of new NSTEMI felt possibly to be related  to pump thrombosis. Hosp course complicated by episodes of CP and hypotension on 1/24 pm hours. Developed acute right MCA CVA on 1/26 which was not amendable to intervention.Intubated 1/26-1/30. CXR 1/30 with slight progression of bilateral interstitial infiltrates. Currently with ? aspiration PNA. Head CT 1/26-New hypodensities in the left basal ganglia and left thalamus,concerning for evolving acute infarcts. Smaller foci of hypoattenuation in the right caudate and thalamus may also represent acute ischemia. Type of Study: Bedside swallow evaluation Previous Swallow Assessment: none Diet Prior to this Study: NPO Temperature Spikes Noted: No Respiratory Status: Nasal cannula History of Recent Intubation: Yes Length of Intubations (days): 4 days Date extubated: 07/23/13 Behavior/Cognition: Cooperative;Pleasant mood;Lethargic Oral Cavity - Dentition: Adequate natural dentition Self-Feeding Abilities: Total assist Patient Positioning: Upright in bed Baseline Vocal Quality: Aphonic;Wet Volitional Cough:  (unable ) Volitional Swallow: Able to elicit    Oral/Motor/Sensory Function Overall Oral Motor/Sensory Function: Impaired Labial ROM: Reduced right;Reduced left Labial Symmetry: Abnormal symmetry left Labial Strength: Reduced Labial Sensation:  (NT) Lingual ROM: Reduced right;Reduced left Lingual Strength: Reduced Facial ROM: Reduced right;Reduced left Facial Strength: Reduced Facial Sensation: Reduced Mandible: Impaired   Ice Chips Ice chips: Not tested   Thin Liquid Thin Liquid: Not tested    Nectar Thick Nectar Thick Liquid: Not tested   Honey Thick Honey Thick Liquid: Not tested   Puree Puree: Not tested   Solid   GO   Eric Hibberd MA, CCC-SLP [phone]  Solid: Not tested       [name] [name] 07/24/2013,11:35 AM

## 2013-07-24 NOTE — Evaluation (Signed)
Speech Language Pathology Evaluation Patient Details Name: [name] MRN: [id number] DOB: 06-13-1961 Today's Date: 07/24/2013 Time: 1100-1115 SLP Time Calculation (min): 15 min  Problem List:  Patient Active Problem List   Diagnosis Date Noted  . CVA (cerebral infarction) 07/19/2013  . Acute respiratory failure 07/19/2013  . Altered mental status 07/19/2013  . NSTEMI, initial episode of care 07/17/2013  . Supratherapeutic INR 08/18/2012  . Acute hemolysis 07/28/2012  . Hypertension 03/21/2012  . Cough 03/03/2012  . Nausea and vomiting 02/26/2012  . SVT (supraventricular tachycardia) 11/27/2011  . LVAD (left ventricular assist device) present 11/27/2011  . Ventricular tachycardia 11/22/2011  . Bipolar affective disorder 10/22/2011  . Migraine 10/22/2011  . Preventative health care 10/21/2011  . Gastritis, acute 09/04/2011  . Hypotension 08/10/2011  . Cardiogenic shock 07/10/2011  . Acute on chronic systolic heart failure 07/07/2011  . LV (left ventricular) mural thrombus 01/28/2011  . Thrombus 08/06/2010  . TOBACCO ABUSE 07/03/2010  . BACK PAIN 03/10/2009  . MITRAL STENOSIS/ INSUFFICIENCY, NON-RHEUMATIC 09/22/2008  . CONGESTIVE HEART FAILURE 09/22/2008  . SYSTOLIC HEART FAILURE, CHRONIC 09/22/2008  . Other malaise and fatigue 09/16/2008  . ABNORMAL HEART SOUNDS 09/16/2008  . DYSPNEA 09/16/2008  . Hepatomegaly 09/16/2008  . ABNORMAL ECHOCARDIOGRAM 09/16/2008  . ICD-[employer] 09/16/2008   Past Medical History:  Past Medical History  Diagnosis Date  . CHF (congestive heart failure)     EF- 10-15  . Medically noncompliant   . Mitral regurgitation   . Tobacco user   . HTN (hypertension)   . AICD (automatic cardioverter/defibrillator) present   . GERD (gastroesophageal reflux disease)   . Substance abuse   . Chronic renal insufficiency   . Syncope   . Thrombus 08/06/2010  . SYSTOLIC HEART FAILURE, CHRONIC 09/22/2008    Qualifier: Diagnosis of  By: Gala Romney,  MD, Trixie Dredge   . LV (left ventricular) mural thrombus 01/28/2011  . ICD - IN SITU 09/16/2008    Qualifier: Diagnosis of  By: Wonda Amis    . MITRAL STENOSIS/ INSUFFICIENCY, NON-RHEUMATIC 09/22/2008    Qualifier: Diagnosis of  By: Gala Romney, MD, Trixie Dredge Hepatomegaly 09/16/2008    Qualifier: Diagnosis of  By: Wonda Amis    . High cholesterol 02/26/2012    "at one time"  . Sleep apnea   . Exertional dyspnea 02/26/2012  . History of blood transfusion 08/2011    "when I had heart pump"  . Migraines   . COMMON MIGRAINE 06/14/2009    Qualifier: Diagnosis of  By: Jonny Ruiz MD, Len Blalock   . History of gout 02/26/2012  . Depression   . Bipolar affective disorder 10/22/2011    pt denies this hx 02/26/2012   Past Surgical History:  Past Surgical History  Procedure Laterality Date  . Cardiac defibrillator placement  ~ 2008  . Left ventricular assist device  08/2011  . Radiology with anesthesia N/A 07/19/2013    Procedure: RADIOLOGY WITH ANESTHESIA;  Surgeon: Oneal Grout, MD;  Location: [facility] OR;  Service: Radiology;  Laterality: N/A;   HPI:  This is a 53 year old male w/ known h/o NICM EF 10-15% w/ severe MR. He is s/p LVAD placement at [facility] March 2013, as bridge to transplant. Admitted for evaluation of new NSTEMI felt possibly to be related to pump thrombosis. Hosp course complicated by episodes of CP and hypotension on 1/24 pm hours. Developed acute right MCA CVA on 1/26 which was not amendable to intervention.Intubated 1/26-1/30. CXR  1/30 with slight progression of bilateral interstitial infiltrates. Currently with ? aspiration PNA. Head CT 1/26-New hypodensities in the left basal ganglia and left thalamus,concerning for evolving acute infarcts. Smaller foci of hypoattenuation in the right caudate and thalamus may also represent acute ischemia.   Assessment / Plan / Recommendation Clinical Impression  Cognitive-linguistic evaluation complete. Patient presents with what  appears to be intact receptive and expressive language and functional cognitive abilities. Communication however limited by what appears to be a severe dysarthria vs oral apraxia with cranial nerve involvement significantly limiting oral strength and ROM. Vocal quality largely aphonic despite  SLP cueing for increased volume. Patient pleasant and cooperative however extent of exam limited by lethargy. Education complete with family in room regarding findings and recommendations. Initiated assessment of patient's ability to utilize communication board with some success. Recommend use of communication board to facilitate verbal communication. SLP will f/u for treatment in the acute care setting. Recommend CIR consult.    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 board-appears WFL)    Expression Expression Primary Mode of  Expression: Verbal (limited) Verbal Expression Overall Verbal Expression: Appears within functional limits for tasks assessed Initiation:  (impaired however appears related to motor function) Level of Generative/Spontaneous Verbalization: Word   Oral / Motor Oral Motor/Sensory Function Overall Oral Motor/Sensory Function: Impaired Labial ROM: Reduced right;Reduced left Labial Symmetry: Abnormal symmetry left Labial Strength: Reduced Labial Sensation:  (NT) Lingual ROM: Reduced right;Reduced left Lingual Strength: Reduced Facial ROM: Reduced right;Reduced left Facial Strength: Reduced Facial Sensation: Reduced Mandible: Impaired Motor Speech Overall Motor Speech: Impaired Respiration: Impaired Level of Impairment: Word Phonation: Aphonic;Wet Articulation: Impaired Level of Impairment: Word Intelligibility: Intelligibility reduced Word: 0-24% accurate Motor Planning: Impaired (?-TBD with further examination, appears strength related) Level of Impairment: Word Interfering Components:  (lethargy)   GO    Ferdinand LangoLeah Aijalon Demuro MA, CCC-SLP [phone]  Gustabo Gordillo [name] 07/24/2013, 11:47 AM

## 2013-07-24 NOTE — Progress Notes (Signed)
[facility] ADULT ICU REPLACEMENT PROTOCOL FOR AM LAB REPLACEMENT ONLY  The patient does apply for the [facility] Adult ICU Electrolyte Replacment Protocol based on the criteria listed below:   1. Is GFR >/= 40 ml/min? yes  Patient's GFR today is >90 2. Is urine output >/= 0.5 ml/kg/hr for the last 6 hours? yes Patient's UOP is 0.5 ml/kg/hr 3. Is BUN < 60 mg/dL? yes  Patient's BUN today is 13 4. Abnormal electrolyte(s):Potassium 5. Ordered repletion with: Potassium per Protocol  [name] P 07/24/2013 4:50 AM

## 2013-07-24 NOTE — Progress Notes (Signed)
Stroke Team Progress Note  HISTORY Sincear Tanton is a 53 y.o. male left-handed male who presented with sudden onset left-sided weakness and aphasia. He was admitted 07/17/2013 with chest pain. He has a history of congestive heart failure and has a left ventricular assist device in place. His INR was low at home - his pump got clotted (likely causing MI), and he loaded himself and became supratherapeutic 4.97 in hospital. He was given vitamin K yesterday 07/18/2013, his INR came down to 1.9. He was then  started on heparin. Around 7:40 AM 07/19/2013, the nurse helped him to get dressed and stated that he seemed sleepy, he was moving his left side well. The patient endorsed that his symptoms started after he woke up this morning, but woke up 07/19/2013 in his normal state. He appeared to understand well. Patient was not administerd TPA secondary to being on systemic heparin and INR 1.9  SUBJECTIVE Extubated yesterday. Needs PANDA feeding. Failed speech eval.   OBJECTIVE Most recent Vital Signs: Filed Vitals:   07/24/13 0400 07/24/13 0500 07/24/13 0600 07/24/13 0726  BP:      Pulse: 81 78 80   Temp: 98.2 F (36.8 C)   98.4 F (36.9 C)  TempSrc: Oral   Oral  Resp: 23 22 23    Height:      Weight:  87 kg (191 lb 12.8 oz)    SpO2: 98% 99% 93%    CBG (last 3)   Recent Labs  07/24/13 0033 07/24/13 0407 07/24/13 0723  GLUCAP 102* 99 114*    IV Fluid Intake:   . sodium chloride 20 mL/hr at 07/22/13 0005  . amiodarone (NEXTERONE PREMIX) 360 mg/200 mL dextrose 30 mg/hr (07/24/13 0400)  . heparin 1,200 Units/hr (07/23/13 1850)  . nitroGLYCERIN Stopped (07/17/13 1800)  . norepinephrine (LEVOPHED) Adult infusion 6 mcg/min (07/23/13 2200)    MEDICATIONS  . allopurinol  100 mg Oral Daily  . antiseptic oral rinse  15 mL Mouth Rinse QID  . aspirin  300 mg Rectal Daily  . chlorhexidine  15 mL Mouth Rinse BID  . clopidogrel  75 mg Per Tube Q breakfast  . feeding supplement (PRO-STAT SUGAR FREE 64)   30 mL Per Tube TID  . feeding supplement (VITAL HIGH PROTEIN)  1,000 mL Per Tube Q24H  . fentaNYL  100 mcg Intravenous Once  . insulin aspart  0-15 Units Subcutaneous Q4H  . magnesium oxide  400 mg Oral Daily  . pantoprazole (PROTONIX) IV  80 mg Intravenous Once  . pantoprazole sodium  40 mg Per Tube Q1200  . piperacillin-tazobactam (ZOSYN)  IV  3.375 g Intravenous Q8H  . sodium chloride  10-40 mL Intracatheter Q12H   PRN:  acetaminophen, fentaNYL, midazolam, nitroGLYCERIN, ondansetron (ZOFRAN) IV  Diet:  NPO , tube feedings Activity:  Bedrest DVT Prophylaxis:  IV heparin  CLINICALLY SIGNIFICANT STUDIES Basic Metabolic Panel:   Recent Labs Lab 07/18/13 0149  07/23/13 0245 07/24/13 0317  NA 135*  < > 137 137  K 4.8  < > 3.4* 3.7  CL 97  < > 99 100  CO2 20  < > 25 25  GLUCOSE 184*  < > 149* 111*  BUN 16  < > 15 13  CREATININE 1.40*  < > 0.97 1.05  CALCIUM 8.7  < > 8.5 8.6  MG 1.9  --   --   --   < > = values in this interval not displayed. Liver Function Tests:   Recent Labs Lab  07/18/13 0149 07/19/13 0500  AST 223* 471*  ALT 43 80*  ALKPHOS 98 85  BILITOT 0.4 1.8*  PROT 8.4* 7.6  ALBUMIN 3.9 3.6   CBC:  Recent Labs Lab 07/18/13 0149  07/23/13 0245 07/24/13 0317  WBC 7.7  < > 7.7 7.9  NEUTROABS 6.1  --   --   --   HGB 16.5  < > 12.4* 11.9*  HCT 48.0  < > 36.5* 35.5*  MCV 88.6  < > 88.0 88.8  PLT 263  < > 213 232  < > = values in this interval not displayed. Coagulation:   Recent Labs Lab 07/21/13 0415 07/22/13 0414 07/23/13 0245 07/24/13 0317  LABPROT 19.3* 18.2* 17.7* 17.6*  INR 1.68* 1.55* 1.50* 1.49   Cardiac Enzymes:   Recent Labs Lab 07/17/13 1525 07/17/13 2017  TROPONINI 17.02* 19.35*   Urinalysis: No results found for this basename: COLORURINE, APPERANCEUR, LABSPEC, PHURINE, GLUCOSEU, HGBUR, BILIRUBINUR, KETONESUR, PROTEINUR, UROBILINOGEN, NITRITE, LEUKOCYTESUR,  in the last 168 hours Lipid Panel    Component Value Date/Time    CHOL 166 07/20/2013 0020   TRIG 96 07/20/2013 0020   HDL 63 07/20/2013 0020   CHOLHDL 2.6 07/20/2013 0020   VLDL 19 07/20/2013 0020   LDLCALC 84 07/20/2013 0020   HgbA1C  Lab Results  Component Value Date   HGBA1C 5.7* 07/19/2013    Urine Drug Screen:     Component Value Date/Time   LABOPIA NONE DETECTED 06/21/2008 1300   COCAINSCRNUR NONE DETECTED 06/21/2008 1300   LABBENZ NONE DETECTED 06/21/2008 1300   AMPHETMU NONE DETECTED 06/21/2008 1300   THCU NONE DETECTED 06/21/2008 1300   LABBARB  Value: NONE DETECTED        DRUG SCREEN FOR MEDICAL PURPOSES ONLY.  IF CONFIRMATION IS NEEDED FOR ANY PURPOSE, NOTIFY LAB WITHIN 5 DAYS. 06/21/2008 1300    Alcohol Level: No results found for this basename: ETH,  in the last 168 hours   CT of the brain   07/20/2013 1. New hypodensities in the left basal ganglia and left thalamus,  concerning for evolving acute infarcts. Smaller foci of hypoattenuation in the right caudate and thalamus may also represent acute ischemia.  2. New paranasal sinus mucosal disease. 07/19/2013    1. No acute intracranial process. 2. Very minimal amount of geographic subcortical encephalomalacia within in the anterior lateral aspect of the left temporal lobe, at the site of the patient's prior hemorrhagic contusion.   Cerebral Angiogram 07/20/2013 Rt CFa approach. Distal small filling defects in the the orbitofrontal br of RT MCA sup division,and tertiary angular division of RT MCA  MRI of the brain    2D Echocardiogram  EF 10%. Diffuse kypokinesis  TEE 07/21/2013 no evidence of clot but with RV failure.   CXR     07/22/2013 Support lines remain in unchanged position. Improved aeration with decreased bibasilar atelectasis.  07/21/2013 1. Stable positioning of support apparatus. No pneumothorax. 2. Pulmonary venous congestion without frank evidence of edema. 3. Slight worsening of perihilar and bibasilar opacities, left greater than right, likely atelectasis.  07/20/2013 1.  Repositioning of endotracheal tube, tip 4.5 cm of carina. 2. Improving left lower lobe atelectasis. 07/19/2013    Nasogastric tube extends into the stomach.    07/19/2013    Endotracheal tube terminates 4 cm above the carina.  Additional support apparatus as above.  07/18/2013  Right subclavian suspect right-sided PICC line tip at cavoatrial junction without pneumothorax.     EKG  sinus tachycardia.  For complete results please see formal report.   Therapy Recommendations pending  Physical Exam   Young african [place] male sedated and intubated. . Afebrile. Head is nontraumatic. Neck is supple without bruit.. Cardiac exam no murmur or gallop. Lungs are clear to auscultation. Distal pulses are well felt. LVAD right side abdomen Neurological Exam : Awake alert and follows commands well. Pupils 3 mm sluggishly reactive. Doll's eye movements are sluggish.Eyes partially open and will blink to threat and follow gaze. No facial grimacing to pain. No spontaneous extremity movements.  L sided hemiplegia, following commands on R side.   ASSESSMENT Mr. Leanne LovelyUndra Galbreath is a 53 y.o. male presenting with left sided weakness.  Imaging supports left basal ganglia and thalamic infarcts larger than right basal ganglia subcortical  infarcts. Infarcts felt to be embolic secondary to either atrial fibrillation, LVAD clot and/or acute MI.  On aspirin 81 mg orally every day, clopidogrel 75 mg orally every day and warfarin prior to admission. Now on aspirin suppositories and intravenous heparin for secondary stroke prevention. Patient with resultant left arm hemiplegia, left leg hemiparesis, dysphagia, VDRF.   Systolic HF, EF 10%, has LVAD  new onset atrial fibrillation with RVR, on amiodarone drip, IV heparin -    NSTEMI - Elevated troponins - will manage medically hypertension Chronic RI Hyperlipidemia, LDL 84, on no statin PTA, now on no statin, at goal LDL < 100  obstructive sleep apnea Hx migraines Bipolar  affective disorder TEE performed 07/21/2013. Ejection fraction 15-20%. No obvious clot was noted. Now extubated  Hospital day # 7  TREATMENT/PLAN  Continue aspirin 81 mg orally every day and heparin for secondary stroke prevention.  Addition of plavix once PANDA is in place   Physical and occupational therapies.  Con't heparin and bridging to anticoagulation  MAP goal >65 on levo.   No new neurological deficits.   Failed speech eval  Cognition improved.   Case d/w with nursing staff and family members at bedside.   Pauletta BrownsZEYLIKMAN, Gissell Barra  07/24/2013, 11:17 AM   I have personally examined this patient, reviewed pertinent data, and developed plan of care and agrees with above

## 2013-07-25 ENCOUNTER — Inpatient Hospital Stay (HOSPITAL_COMMUNITY): Payer: Medicare Other

## 2013-07-25 DIAGNOSIS — T82897A Other specified complication of cardiac prosthetic devices, implants and grafts, initial encounter: Secondary | ICD-10-CM | POA: Diagnosis not present

## 2013-07-25 DIAGNOSIS — R079 Chest pain, unspecified: Secondary | ICD-10-CM | POA: Diagnosis not present

## 2013-07-25 LAB — POCT I-STAT 3, ART BLOOD GAS (G3+)
Acid-Base Excess: 9 mmol/L — ABNORMAL HIGH (ref 0.0–2.0)
BICARBONATE: 31.4 meq/L — AB (ref 20.0–24.0)
O2 SAT: 100 %
Patient temperature: 98.1
TCO2: 32 mmol/L (ref 0–100)
pCO2 arterial: 33.9 mmHg — ABNORMAL LOW (ref 35.0–45.0)
pH, Arterial: 7.575 — ABNORMAL HIGH (ref 7.350–7.450)
pO2, Arterial: 384 mmHg — ABNORMAL HIGH (ref 80.0–100.0)

## 2013-07-25 LAB — BASIC METABOLIC PANEL
BUN: 13 mg/dL (ref 6–23)
CHLORIDE: 101 meq/L (ref 96–112)
CO2: 28 mEq/L (ref 19–32)
CREATININE: 1.13 mg/dL (ref 0.50–1.35)
Calcium: 8.9 mg/dL (ref 8.4–10.5)
GFR calc non Af Amer: 74 mL/min — ABNORMAL LOW (ref >90)
GFR, EST AFRICAN AMERICAN: 86 mL/min — AB (ref >90)
Glucose, Bld: 98 mg/dL (ref 70–99)
POTASSIUM: 4 meq/L (ref 3.7–5.3)
Sodium: 141 mEq/L (ref 137–147)

## 2013-07-25 LAB — PHOSPHORUS: Phosphorus: 3.7 mg/dL (ref 2.3–4.6)

## 2013-07-25 LAB — LACTATE DEHYDROGENASE: LDH: 1165 U/L — ABNORMAL HIGH (ref 94–250)

## 2013-07-25 LAB — GLUCOSE, CAPILLARY
GLUCOSE-CAPILLARY: 90 mg/dL (ref 70–99)
GLUCOSE-CAPILLARY: 109 mg/dL — AB (ref 70–99)
GLUCOSE-CAPILLARY: 103 mg/dL — AB (ref 70–99)
Glucose-Capillary: 160 mg/dL — ABNORMAL HIGH (ref 70–99)
Glucose-Capillary: 101 mg/dL — ABNORMAL HIGH (ref 70–99)
Glucose-Capillary: 98 mg/dL (ref 70–99)
Glucose-Capillary: 105 mg/dL — ABNORMAL HIGH (ref 70–99)
Glucose-Capillary: 92 mg/dL (ref 70–99)
Glucose-Capillary: 99 mg/dL (ref 70–99)

## 2013-07-25 LAB — PROTIME-INR
INR: 1.61 — AB (ref 0.00–1.49)
Prothrombin Time: 18.7 seconds — ABNORMAL HIGH (ref 11.6–15.2)

## 2013-07-25 LAB — CARBOXYHEMOGLOBIN
CARBOXYHEMOGLOBIN: 1.4 % (ref 0.5–1.5)
Methemoglobin: 0.8 % (ref 0.0–1.5)
O2 Saturation: 61.5 %
Total hemoglobin: 12 g/dL — ABNORMAL LOW (ref 13.5–18.0)

## 2013-07-25 LAB — CBC
HEMATOCRIT: 34 % — AB (ref 39.0–52.0)
Hemoglobin: 11.3 g/dL — ABNORMAL LOW (ref 13.0–17.0)
MCH: 29.7 pg (ref 26.0–34.0)
MCHC: 33.2 g/dL (ref 30.0–36.0)
MCV: 89.5 fL (ref 78.0–100.0)
Platelets: 257 10*3/uL (ref 150–400)
RBC: 3.8 MIL/uL — ABNORMAL LOW (ref 4.22–5.81)
RDW: 15.2 % (ref 11.5–15.5)
WBC: 6.4 10*3/uL (ref 4.0–10.5)

## 2013-07-25 LAB — MAGNESIUM: MAGNESIUM: 2.2 mg/dL (ref 1.5–2.5)

## 2013-07-25 LAB — HEPARIN LEVEL (UNFRACTIONATED): Heparin Unfractionated: 0.46 IU/mL (ref 0.30–0.70)

## 2013-07-25 MED ORDER — IOHEXOL 300 MG/ML  SOLN
50.0000 mL | Freq: Once | INTRAMUSCULAR | Status: AC | PRN
  Administered 2013-07-25: 20 mL via ORAL

## 2013-07-25 MED ORDER — FUROSEMIDE 10 MG/ML IJ SOLN
INTRAMUSCULAR | Status: AC
  Filled 2013-07-25: qty 4

## 2013-07-25 MED ORDER — ASPIRIN 325 MG PO TABS
325.0000 mg | ORAL_TABLET | Freq: Every day | ORAL | Status: DC
  Administered 2013-07-25: 325 mg via ORAL
  Filled 2013-07-25 (×2): qty 1

## 2013-07-25 MED ORDER — ETOMIDATE 2 MG/ML IV SOLN
10.0000 mg | Freq: Once | INTRAVENOUS | Status: AC
  Administered 2013-07-25: 10 mg via INTRAVENOUS

## 2013-07-25 MED ORDER — PANTOPRAZOLE SODIUM 40 MG IV SOLR
40.0000 mg | INTRAVENOUS | Status: DC
Start: 2013-07-25 — End: 2013-07-31
  Administered 2013-07-25 – 2013-07-30 (×6): 40 mg via INTRAVENOUS
  Filled 2013-07-25 (×9): qty 40

## 2013-07-25 MED ORDER — ROCURONIUM BROMIDE 50 MG/5ML IV SOLN
50.0000 mg | Freq: Once | INTRAVENOUS | Status: AC
  Administered 2013-07-25: 10 mg via INTRAVENOUS
  Filled 2013-07-25: qty 5

## 2013-07-25 MED ORDER — DEXMEDETOMIDINE HCL IN NACL 400 MCG/100ML IV SOLN
0.4000 ug/kg/h | INTRAVENOUS | Status: DC
  Administered 2013-07-25: 1 ug/kg/h via INTRAVENOUS
  Administered 2013-07-25 – 2013-07-27 (×15): 1.2 ug/kg/h via INTRAVENOUS
  Administered 2013-07-27: 0.6 ug/kg/h via INTRAVENOUS
  Administered 2013-07-28: 1.2 ug/kg/h via INTRAVENOUS
  Administered 2013-07-28: 0.6 ug/kg/h via INTRAVENOUS
  Administered 2013-07-28 (×2): 1.2 ug/kg/h via INTRAVENOUS
  Administered 2013-07-29 (×2): 0.8 ug/kg/h via INTRAVENOUS
  Filled 2013-07-25: qty 100
  Filled 2013-07-25: qty 50
  Filled 2013-07-25 (×5): qty 100
  Filled 2013-07-25 (×2): qty 50
  Filled 2013-07-25 (×8): qty 100
  Filled 2013-07-25: qty 50
  Filled 2013-07-25 (×3): qty 100

## 2013-07-25 MED ORDER — MIDAZOLAM HCL 2 MG/2ML IJ SOLN
INTRAMUSCULAR | Status: AC
  Administered 2013-07-25: 2 mg
  Filled 2013-07-25: qty 2

## 2013-07-25 MED ORDER — FENTANYL CITRATE 0.05 MG/ML IJ SOLN
100.0000 ug | Freq: Once | INTRAMUSCULAR | Status: AC
  Administered 2013-07-25: 100 ug via INTRAVENOUS
  Filled 2013-07-25: qty 2

## 2013-07-25 MED ORDER — FENTANYL CITRATE 0.05 MG/ML IJ SOLN
25.0000 ug | INTRAMUSCULAR | Status: DC | PRN
  Administered 2013-07-27 – 2013-07-28 (×2): 100 ug via INTRAVENOUS
  Administered 2013-07-28: 50 ug via INTRAVENOUS
  Administered 2013-08-05: 100 ug via INTRAVENOUS
  Filled 2013-07-25 (×4): qty 2

## 2013-07-25 MED ORDER — FUROSEMIDE 10 MG/ML IJ SOLN: 20.0000 mg | Freq: Three times a day (TID) | INTRAMUSCULAR | Status: AC

## 2013-07-25 MED ORDER — FUROSEMIDE 10 MG/ML IJ SOLN
40.0000 mg | INTRAMUSCULAR | Status: AC
  Administered 2013-07-25: 40 mg via INTRAVENOUS

## 2013-07-25 MED ORDER — MIDAZOLAM HCL 2 MG/2ML IJ SOLN: 2.0000 mg | Freq: Once | INTRAMUSCULAR | Status: AC

## 2013-07-25 MED FILL — Medication: Qty: 1 | Status: AC

## 2013-07-25 NOTE — Procedures (Signed)
OGT Placement  Patient was sedated and paralyzed for ET tube placement.  RN was unable to place OGT prior.  OGT placed under direct laryngoscopy.  Alyson Reedy, M.D. [facility] Pulmonary/Critical Care Medicine. Pager: [phone]. After hours pager: [phone].

## 2013-07-25 NOTE — Progress Notes (Signed)
PT Cancellation Note  Patient Details Name: Parx Schmeichel MRN: [id number] DOB: Jun 19, 1961   Cancelled Treatment:    Reason Eval/Treat Not Completed: Medical issues which prohibited therapy.  Patient back on NRB due to decreased O2 sats.  Will hold PT today.  Will return tomorrow for PT evaluation.   Vena Austria 07/25/2013, 3:25 PM Durenda Hurt. Renaldo Fiddler, [facility] Acute Rehab Services Pager [phone]

## 2013-07-25 NOTE — Progress Notes (Signed)
Stroke Team Progress Note  HISTORY [name] is a 53 y.o. male left-handed male who presented with sudden onset left-sided weakness and aphasia. He was admitted 07/17/2013 with chest pain. He has a history of congestive heart failure and has a left ventricular assist device in place. His INR was low at home - his pump got clotted (likely causing MI), and he loaded himself and became supratherapeutic 4.97 in hospital. He was given vitamin K yesterday 07/18/2013, his INR came down to 1.9. He was then  started on heparin. Around 7:40 AM 07/19/2013, the nurse helped him to get dressed and stated that he seemed sleepy, he was moving his left side well. The patient endorsed that his symptoms started after he woke up this morning, but woke up 07/19/2013 in his normal state. He appeared to understand well. Patient was not administerd TPA secondary to being on systemic heparin and INR 1.9  SUBJECTIVE Extubated yesterday. Needs PANDA feeding going to IR today. Off pressors  OBJECTIVE Most recent Vital Signs: Filed Vitals:   07/25/13 0500 07/25/13 0600 07/25/13 0700 07/25/13 0816  BP:      Pulse: 81 82 79   Temp:    98.3 F (36.8 C)  TempSrc:    Oral  Resp: 17 23 20    Height:      Weight: 84 kg (185 lb 3 oz)     SpO2: 99% 91% 97%    CBG (last 3)   Recent Labs  07/25/13 0117 07/25/13 0501 07/25/13 0813  GLUCAP 101* 98 105*    IV Fluid Intake:   . sodium chloride 20 mL/hr at 07/25/13 0700  . amiodarone (NEXTERONE PREMIX) 360 mg/200 mL dextrose 30 mg/hr (07/25/13 0700)  . heparin 1,200 Units/hr (07/25/13 0700)  . nitroGLYCERIN Stopped (07/17/13 1800)  . norepinephrine (LEVOPHED) Adult infusion 1 mcg/min (07/25/13 0600)    MEDICATIONS  . allopurinol  100 mg Oral Daily  . aspirin  300 mg Rectal Daily  . clopidogrel  75 mg Per Tube Q breakfast  . feeding supplement (PRO-STAT SUGAR FREE 64)  30 mL Per Tube TID  . feeding supplement (VITAL HIGH PROTEIN)  1,000 mL Per Tube Q24H  . fentaNYL   100 mcg Intravenous Once  . furosemide  20 mg Intravenous Q8H  . insulin aspart  0-15 Units Subcutaneous Q4H  . magnesium oxide  400 mg Oral Daily  . piperacillin-tazobactam (ZOSYN)  IV  3.375 g Intravenous Q8H  . sodium chloride  10-40 mL Intracatheter Q12H   PRN:  acetaminophen, fentaNYL, midazolam, nitroGLYCERIN, ondansetron (ZOFRAN) IV  Diet:  NPO , tube feedings Activity:  Bedrest DVT Prophylaxis:  IV heparin  CLINICALLY SIGNIFICANT STUDIES Basic Metabolic Panel:   Recent Labs Lab 07/24/13 0317 07/25/13 0515  NA 137 141  K 3.7 4.0  CL 100 101  CO2 25 28  GLUCOSE 111* 98  BUN 13 13  CREATININE 1.05 1.13  CALCIUM 8.6 8.9  MG  --  2.2  PHOS  --  3.7   Liver Function Tests:   Recent Labs Lab 07/19/13 0500  AST 471*  ALT 80*  ALKPHOS 85  BILITOT 1.8*  PROT 7.6  ALBUMIN 3.6   CBC:   Recent Labs Lab 07/24/13 0317 07/25/13 0515  WBC 7.9 6.4  HGB 11.9* 11.3*  HCT 35.5* 34.0*  MCV 88.8 89.5  PLT 232 257   Coagulation:   Recent Labs Lab 07/22/13 0414 07/23/13 0245 07/24/13 0317 07/25/13 0515  LABPROT 18.2* 17.7* 17.6* 18.7*  INR  1.55* 1.50* 1.49 1.61*   Cardiac Enzymes:  No results found for this basename: CKTOTAL, CKMB, CKMBINDEX, TROPONINI,  in the last 168 hours Urinalysis: No results found for this basename: COLORURINE, APPERANCEUR, LABSPEC, PHURINE, GLUCOSEU, HGBUR, BILIRUBINUR, KETONESUR, PROTEINUR, UROBILINOGEN, NITRITE, LEUKOCYTESUR,  in the last 168 hours Lipid Panel    Component Value Date/Time   CHOL 166 07/20/2013 0020   TRIG 96 07/20/2013 0020   HDL 63 07/20/2013 0020   CHOLHDL 2.6 07/20/2013 0020   VLDL 19 07/20/2013 0020   LDLCALC 84 07/20/2013 0020   HgbA1C  Lab Results  Component Value Date   HGBA1C 5.7* 07/19/2013    Urine Drug Screen:     Component Value Date/Time   LABOPIA NONE DETECTED 06/21/2008 1300   COCAINSCRNUR NONE DETECTED 06/21/2008 1300   LABBENZ NONE DETECTED 06/21/2008 1300   AMPHETMU NONE DETECTED 06/21/2008  1300   THCU NONE DETECTED 06/21/2008 1300   LABBARB  Value: NONE DETECTED        DRUG SCREEN FOR MEDICAL PURPOSES ONLY.  IF CONFIRMATION IS NEEDED FOR ANY PURPOSE, NOTIFY LAB WITHIN 5 DAYS. 06/21/2008 1300    Alcohol Level: No results found for this basename: ETH,  in the last 168 hours   CT of the brain   07/20/2013 1. New hypodensities in the left basal ganglia and left thalamus,  concerning for evolving acute infarcts. Smaller foci of hypoattenuation in the right caudate and thalamus may also represent acute ischemia.  2. New paranasal sinus mucosal disease. 07/19/2013    1. No acute intracranial process. 2. Very minimal amount of geographic subcortical encephalomalacia within in the anterior lateral aspect of the left temporal lobe, at the site of the patient's prior hemorrhagic contusion.   Cerebral Angiogram 07/20/2013 Rt CFa approach. Distal small filling defects in the the orbitofrontal br of RT MCA sup division,and tertiary angular division of RT MCA  MRI of the brain    2D Echocardiogram  EF 10%. Diffuse kypokinesis  TEE 07/21/2013 no evidence of clot but with RV failure.   CXR     07/22/2013 Support lines remain in unchanged position. Improved aeration with decreased bibasilar atelectasis.  07/21/2013 1. Stable positioning of support apparatus. No pneumothorax. 2. Pulmonary venous congestion without frank evidence of edema. 3. Slight worsening of perihilar and bibasilar opacities, left greater than right, likely atelectasis.  07/20/2013 1. Repositioning of endotracheal tube, tip 4.5 cm of carina. 2. Improving left lower lobe atelectasis. 07/19/2013    Nasogastric tube extends into the stomach.    07/19/2013    Endotracheal tube terminates 4 cm above the carina.  Additional support apparatus as above.  07/18/2013  Right subclavian suspect right-sided PICC line tip at cavoatrial junction without pneumothorax.     EKG  sinus tachycardia. For complete results please see formal report.    Therapy Recommendations pending  Physical Exam   Young african [place] male sedated and intubated. . Afebrile. Head is nontraumatic. Neck is supple without bruit.. Cardiac exam no murmur or gallop. Lungs are clear to auscultation. Distal pulses are well felt. LVAD right side abdomen Neurological Exam : Awake alert and follows commands well. Pupils 3 mm sluggishly reactive. Doll's eye movements are sluggish.Eyes partially open and will blink to threat and follow gaze. No facial grimacing to pain. No spontaneous extremity movements.  L sided hemiplegia, following commands on R side.   ASSESSMENT [name] is a 53 y.o. male presenting with left sided weakness.  Imaging supports left basal ganglia and  thalamic infarcts larger than right basal ganglia subcortical  infarcts. Infarcts felt to be embolic secondary to either atrial fibrillation, LVAD clot and/or acute MI.  On aspirin 81 mg orally every day, clopidogrel 75 mg orally every day and warfarin prior to admission. Now on aspirin suppositories and intravenous heparin for secondary stroke prevention. Patient with resultant left arm hemiplegia, left leg hemiparesis, dysphagia, VDRF.   Systolic HF, EF 10%, has LVAD  new onset atrial fibrillation with RVR, on amiodarone drip, IV heparin -    NSTEMI - Elevated troponins - will manage medically hypertension Chronic RI Hyperlipidemia, LDL 84, on no statin PTA, now on no statin, at goal LDL < 100  obstructive sleep apnea Hx migraines Bipolar affective disorder TEE performed 07/21/2013. Ejection fraction 15-20%. No obvious clot was noted. Now extubated  Hospital day # 8  TREATMENT/PLAN  Continue aspirin 81 mg orally every day and heparin for secondary stroke prevention.  Addition of plavix once PANDA is in place   Physical and occupational therapies. No new neurological deficits. Pt follows commands.   Con't heparin and bridging to anticoagulation  MAP goal >65, levo is on hold    Failed speech eval. Going to IR today for feeding placement.   Cognition improved.    Pauletta Browns  07/25/2013, 10:07 AM   I have personally examined this patient, reviewed pertinent data, and developed plan of care and agrees with above

## 2013-07-25 NOTE — Procedures (Signed)
Arterial Catheter Insertion Procedure Note [name] [id number] 1961/06/12  Procedure: Insertion of Arterial Catheter  Indications: Blood pressure monitoring  Procedure Details Consent: Unable to obtain consent because of emergent medical necessity. Time Out: Verified patient identification, verified procedure, site/side was marked, verified correct patient position, special equipment/implants available, medications/allergies/relevent history reviewed, required imaging and test results available.  Performed  Maximum sterile technique was used including antiseptics, cap, gloves, gown, hand hygiene, mask and sheet. Skin prep: Chlorhexidine; local anesthetic administered 20 gauge catheter was inserted into right radial artery using the Seldinger technique.  Evaluation Blood flow good; BP tracing good. Complications: No apparent complications.   [name] 07/25/2013

## 2013-07-25 NOTE — Progress Notes (Signed)
Respiratory therapy note- Patient intubated for airway protection. Very difficult intubation with several attempt. 7.5 ETT inserted and confirmed, x-ray is pending. Patient does have large amount oral and nasal bleeding at this time, RN aware.

## 2013-07-25 NOTE — Progress Notes (Signed)
Patient ID: [name], male   DOB: 05/31/1961, 53 y.o.   MRN: [id number] Advanced Heart Failure Rounding Note  [name] is 53 y/o male with a history of severe CHF due to NICM EF 10-15%, and severe MR s/p HM II LVAD placed at [facility] March 2013. His medical history also consists of CRI, NSVT s/p ICD and LV thrombus.   Admitted 1/24 with NSTEMI felt secondary due embolic phenomenon due to pump thrombosis.  LDH high around 900.  On way to cath lab 1/26 developed acute onset of aphasia and left-sided hemiparesis. CT negative for bleed. Taken emergently to neuro-interventional suite but no amenable lesions. Remains intubated on levophed. Remains on propofol gtt, unable to assess neuro status.  S/P TEE 1/28. No evidence of clot but with RV failure.   Extubated 1/30. Wide awake this am. Remains aphasic and completely plegic on L side. Struggling with very heavy secretions. Failed swallow study.  Unable to pass NG tube due  Follows commands. Levophed off this am. PIs much more stable. Go one dose of lasix yesterday. I?os slightly positive but weight and CVP are down.   MAPs 78-88 today  07/19/13:  INR 1.6. Heparin 0.46  LDH:  [phone]  Co-ox: 55.3>64.4>83>73>68>63>69> 62  CVP ~10  VAD interrogated personally.  Flow 3.7 , Speed 9400 PI 4.2,  Power 5.0   Events: multiple PI events  Alarms: Power disconnected (accidental by nurse)   [name] Vitals:   07/25/13 0500 07/25/13 0600 07/25/13 0700 07/25/13 0816  BP:      Pulse: 81 82 79   Temp:    98.3 F (36.8 C)  TempSrc:    Oral  Resp: 17 23 20    Height:      Weight: 84 kg (185 lb 3 oz)     SpO2: 99% 91% 97%     Intake/Output Summary (Last 24 hours) at 07/25/13 0843 Last data filed at 07/25/13 0700  Gross per 24 hour  Intake   1262 ml  Output    970 ml  Net    292 ml    LABS: Basic Metabolic Panel:  Recent Labs  70/96/28 0317 07/25/13 0515  NA 137 141  K 3.7 4.0  CL 100 101  CO2 25 28  GLUCOSE 111* 98  BUN 13 13  CREATININE  1.05 1.13  CALCIUM 8.6 8.9  MG  --  2.2  PHOS  --  3.7   Liver Function Tests: No results found for this basename: AST, ALT, ALKPHOS, BILITOT, PROT, ALBUMIN,  in the last 72 hours No results found for this basename: LIPASE, AMYLASE,  in the last 72 hours CBC:  Recent Labs  07/24/13 0317 07/25/13 0515  WBC 7.9 6.4  HGB 11.9* 11.3*  HCT 35.5* 34.0*  MCV 88.8 89.5  PLT 232 257   Cardiac Enzymes: No results found for this basename: CKTOTAL, CKMB, CKMBINDEX, TROPONINI,  in the last 72 hours RADIOLOGY: Dg Chest Portable 1 View  07/17/2013   CLINICAL DATA:  Left-sided chest pain for 3 days, weakness and dizziness.  EXAM: PORTABLE CHEST - 1 VIEW  COMPARISON:  08/03/2012  FINDINGS: Evidence of median sternotomy noted with LVAD in place. Left-sided defibrillator noted. Mild prominence of the cardiac silhouette persists. Tricuspid valvuloplasty reidentified. Trace left pleural fluid or thickening persists. No new pulmonary opacity.  IMPRESSION: No new acute abnormality. Stable appearance of trace left pleural fluid or thickening.   Electronically Signed   By: Christiana Pellant M.D.   On: 07/17/2013 10:12  PHYSICAL EXAM General: Extubated awake. Aphasic. Heavy oral secretions Neck: JVP to jaw Lungs: Diffuse rhoncho CV: LVAD pump hum normal.  Trace edema.   Abdomen: Soft, nontender, no hepatosplenomegaly, no distention.  Neurologic: awake interactive. Expressive aphasia. L sided paralysis. Moves R side without problem Extremities: No clubbing or cyanosis.   TELEMETRY: Reviewed telemetry pt in sinus tachy in 80-90   Assessment:   1. Acute embolic CVA 2. NSTEMI - Likely pump thrombosis with coronary embolus.  2. Chronic systolic HF s/p HM II VAD  3. NICM EF 15%  4. Hyperkalemia  5. HTN - MAP 70s currently  6. Acute respiratory failure 7. Shock - suspect mixed picture RV failure/vasodilatory  Plan/Discussion:    He has had NSTEMI and acute CVA due to pump thrombosis/embolism. Now  extubated. Remains aphasic and densely plegic on left. Hemodynamically improved. Levophed used for R heart failure - now off today. Will diurese carefully as he is preload dependent with RV failure and VAD. Continue to manage NSTEMI medically. No b-blocker due to shock. May be able to start soon.   Main issue now remains his secretions and ability to protect airway as well as his aphasia and L hemiplegia. PT/OT and speech on board. We have contacted IR for placement of Dobhoff tube today  Repeat CT 1/26 without bleed. Continue heparin and ASA 325 PR. Resume Plavix once feeding tube in.   AF is quiescent on IV amio.   The patient is critically ill with multiple organ systems failure and requires high complexity decision making for assessment and support, frequent evaluation and titration of therapies, application of advanced monitoring technologies and extensive interpretation of multiple databases.   Critical Care Time devoted to patient care services described in this note is 35 Minutes.  Eric BoomDaniel Brennan Karam,MD 8:43 AM

## 2013-07-25 NOTE — Progress Notes (Signed)
Did attempt to call his mother with phone listed in chart and on visitation sheet [phone], number is disconnected.

## 2013-07-25 NOTE — Progress Notes (Signed)
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 He is s/p LVAD placement at [facility] March 2013, as bridge to transplant. Admitted for evaluation of new NSTEMI felt possibly to be related to pump thrombosis. Hosp course complicated by episodes of CP and hypotension on 1/24 pm hours. 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 PCCM asked to see post-op for vent weaning.    SIGNIFICANT EVENTS / STUDIES:  1/24: admitted w/ CP and trop rise. Did have sub-therapeutic INR at home so concern was for pump thrombosis.  1/24: evening had CP, rising Trop I, got mso4 and NTG>>>got hypotensive requiring pressors.  1/25 INR supra-therapeutic. Coumadin stopped, heparin started 1/26: CT head: negative for bleed.  1/26:cerebral arteriogram:  acute right MCA embolic CVA. Not amendable to extraction due to distal location.  1/30 Extubated but airway protection is questionable at best  LINES / TUBES: LVAD OETT 1/26>>>1/30 Right PICC 1/25>>>  CULTURES: Resp 1/27 >> few GNR >> normal flora  ANTIBIOTICS: 1/27 zosyn (aspiration, GNR) >>   SUBJECTIVE:  More awake this AM but not speaking or coughing to command, continue to require NTS  VITAL SIGNS: Temp:  [97.9 F (36.6 C)-98.8 F (37.1 C)] 98.3 F (36.8 C) (02/01 0816) Pulse Rate:  [74-90] 79 (02/01 0700) Resp:  [16-29] 20 (02/01 0700) SpO2:  [91 %-100 %] 97 % (02/01 0700) Weight:  [84 kg (185 lb 3 oz)] 84 kg (185 lb 3 oz) (02/01 0500) HEMODYNAMICS: CVP:  [10 mmHg-16 mmHg] 12 mmHg VENTILATOR SETTINGS: Person INTAKE / OUTPUT: Intake/Output     01/31 0701 - 02/01 0700 02/01 0701 - 02/02 0700   I.V. (mL/kg) 1191.3 (14.2)     NG/GT     IV Piggyback 125    Total Intake(mL/kg) 1316.3 (15.7)    Urine (mL/kg/hr) 970 (0.5)    Total Output 970     Net +346.3           PHYSICAL EXAMINATION: General:  53 year old aam currently extubated but not speaking Neuro:  Eyes open, PERRL, follows commands, moves RUE and RLE, hemiplegic on left HEENT:  [place]/AT, PERRL, EOM-I and MMM.  Cardiovascular:  LVAD pump hum. No edema  Lungs:  Coarse scattered rhonchi  Abdomen:  Soft, nontender  Musculoskeletal:  Intact  Skin:  Intact  LABS:  CBC  Recent Labs Lab 07/23/13 0245 07/24/13 0317 07/25/13 0515  WBC 7.7 7.9 6.4  HGB 12.4* 11.9* 11.3*  HCT 36.5* 35.5* 34.0*  PLT 213 232 257   Coag's  Recent Labs Lab 07/23/13 0245 07/24/13 0317 07/25/13 0515  INR 1.50* 1.49 1.61*   BMET  Recent Labs Lab 07/23/13 0245 07/24/13 0317 07/25/13 0515  NA 137 137 141  K 3.4* 3.7 4.0  CL 99 100 101  CO2 25 25 28   BUN 15 13 13   CREATININE 0.97 1.05 1.13  GLUCOSE 149* 111* 98   Electrolytes  Recent Labs Lab 07/23/13 0245 07/24/13 0317 07/25/13 0515  CALCIUM 8.5 8.6 8.9  MG  --   --  2.2  PHOS  --   --  3.7   Sepsis Markers  Recent Labs Lab 07/20/13 1030 07/21/13 0415  07/22/13 0414  PROCALCITON 0.13 0.19 0.17   ABG  Recent Labs Lab 07/19/13 1301  PHART 7.476*  PCO2ART 36.1  PO2ART 210.0*   Liver Enzymes  Recent Labs Lab 07/19/13 0500  AST 471*  ALT 80*  ALKPHOS 85  BILITOT 1.8*  ALBUMIN 3.6   Cardiac Enzymes No results found for this basename: TROPONINI, PROBNP,  in the last 168 hours Glucose  Recent Labs Lab 07/24/13 1158 07/24/13 1613 07/24/13 2110 07/25/13 0117 07/25/13 0501 07/25/13 0813  GLUCAP 102* 86 90 101* 98 105*    Imaging Dg Chest Port 1 View  07/24/2013   CLINICAL DATA:  Interstitial infiltrates  EXAM: PORTABLE CHEST - 1 VIEW  COMPARISON:  the previous day's study  FINDINGS: The patient has been extubated and the nasogastric tube removed. Right arm PICC and left  subclavian AICD stable. LVAD partially seen. Previous median sternotomy. Stable mild cardiomegaly. Mild bibasilar interstitial and alveolar opacities, stable since previous exam. Suspect small left pleural effusion versus pleural scarring. Marland Kitchen.  IMPRESSION: 1. Extubation with slightly improved aeration, persistent bibasilar opacities as above. 2. Stable cardiomegaly. 3. Remaining Support hardware stable in position.   Electronically Signed   By: Oley Balmaniel  Hassell M.D.   On: 07/24/2013 07:48     CXR:  1/30 > slight progression B interstitial infiltrates  ASSESSMENT / PLAN:  PULMONARY A: acute respiratory failure  S/p acute right MCA CVA. Has copious airway secretions. Concerned about element of edema. Pulmonary exam had been unremarkable prior.  P:   - Extubated but airway protection is questionable at best. - IS per RT protocol. - Flutter valve per RT protocol. - Titrate O2 for sat of 88-92%. - PT/OT.  CARDIOVASCULAR A:  NSTEMI (felt to be Pump thrombosis) Acute on Chronic systolic HF (EF 10-15%) w/ [facility].  Cardiogenic shock  P - LVAD per cards, current flow rate 4.0 - Levophed for MAP >70, weaning down, attempt to get off today. - Asa, plavix and heparin per cards. - Lasix as below.  RENAL A:  AKI in setting of hypoperfusion, s/p contrast load for cerebral arteriogram.  P:   - MAP goals >65 - F/u chemistry  - Renal dose meds  - Lasix 20 mg IV x2 doses  GASTROINTESTINAL A:   Rising LFTs in setting of on-going ischemia  At risk for dysphagia   P:   - PPI for PUD. - Swallow evaluation failed, to go to IR for NGT placement. - Once NGT is in resume TF.  HEMATOLOGIC A:   Chronic anticoagulation (on coumadin for LVAD) Concern about acute pump related embolic events P:  - Heparin per pharmacy.  INFECTIOUS A:  ? aspiration pneumonia P:   - F/u cxr, follow. - Continue zosyn, total abx course planned 8 days (6/8).  ENDOCRINE A:  Mild  hyperglycemia  P:   - SSI if glucose > 150.  NEUROLOGIC A:  Right MCA embolic CVA. Not amendable to IR  P:   - D/C all sedation. - Further recommendations per neuro.  TODAY'S SUMMARY: Extubated, airway protection is questionable at best, NTS frequently, need to address with family if fails due to airway protection would they want trach/peg given neuro and cardiac status.  I have personally obtained a history, examined the patient, evaluated laboratory and imaging results, formulated the assessment and plan and placed orders.  CRITICAL CARE: The patient is critically ill with multiple organ systems failure and requires high complexity decision making for assessment  and support, frequent evaluation and titration of therapies, application of advanced monitoring technologies and extensive interpretation of multiple databases. Critical Care Time devoted to patient care services described in this note is 35 minutes.   Alyson Reedy, M.D. [facility] Pulmonary/Critical Care Medicine. Pager: [phone]. After hours pager: [phone].

## 2013-07-25 NOTE — Progress Notes (Signed)
Paged Dr. Gala Romney, informed him that NG was placed, got orders to give ASA 325 and Plavix 75 via NG tube

## 2013-07-25 NOTE — Procedures (Signed)
Intubation Procedure Note Julio Stephani [id number] 1961/05/21  Procedure: Intubation Indications: Airway protection and maintenance  Procedure Details Consent: Risks of procedure as well as the alternatives and risks of each were explained to the (patient/caregiver).  Consent for procedure obtained. Time Out: Verified patient identification, verified procedure, site/side was marked, verified correct patient position, special equipment/implants available, medications/allergies/relevent history reviewed, required imaging and test results available.  Performed  Maximum sterile technique was used including antiseptics, gloves, hand hygiene and mask.  MAC    Evaluation Hemodynamic Status: BP stable throughout; O2 sats: stable throughout Patient's Current Condition: stable Complications: No apparent complications Patient did tolerate procedure well. Chest X-ray ordered to verify placement.  CXR: pending.   [name] 07/25/2013

## 2013-07-25 NOTE — Progress Notes (Signed)
VAD readings:  MAP was 84  PF: - - - Speed 9400 PI 2.3 Power 5   Called Dr. Gala Romney about VAD readings, will turn on LEVO at per MD to support RV.

## 2013-07-25 NOTE — Progress Notes (Signed)
ANTICOAGULATION CONSULT NOTE - Follow Up Consult  Pharmacy Consult for heparin Indication: LVAD thrombosis/CVA  Allergies  Allergen Reactions  . Ace Inhibitors Cough  . Lexapro [Escitalopram Oxalate] Other (See Comments)    somnolence    Patient Measurements: Height: 5\' 4"  (162.6 cm) Weight: 185 lb 3 oz (84 kg) IBW/kg (Calculated) : 59.2 Heparin dosing wt: 77 kg   Vital Signs: Temp: 98.3 F (36.8 C) (02/01 0816) Temp src: Oral (02/01 0816) Pulse Rate: 79 (02/01 0700)  Labs:  Recent Labs  07/23/13 0245 07/23/13 1815 07/24/13 0317 07/25/13 0515  HGB 12.4*  --  11.9* 11.3*  HCT 36.5*  --  35.5* 34.0*  PLT 213  --  232 257  LABPROT 17.7*  --  17.6* 18.7*  INR 1.50*  --  1.49 1.61*  HEPARINUNFRC 0.54 0.53 0.33 0.46  CREATININE 0.97  --  1.05 1.13    Estimated Creatinine Clearance: 76.4 ml/min (by C-G formula based on Cr of 1.13).  . sodium chloride 20 mL/hr at 07/25/13 0700  . amiodarone (NEXTERONE PREMIX) 360 mg/200 mL dextrose 30 mg/hr (07/25/13 0700)  . heparin 1,200 Units/hr (07/25/13 0700)  . nitroGLYCERIN Stopped (07/17/13 1800)  . norepinephrine (LEVOPHED) Adult infusion 1 mcg/min (07/25/13 0600)    Assessment: 53 yo male with LVAD, suspected pump thombosis, admitted for NSTEMI, then developed acute CVA prior to cath. Coumadin on hold due to NPO with IV heparin bridge. INR 1.61. Heparin level therapeutic (0.46). No bleeding noted. Hgb down a bit, plt stable.  Goal of Therapy:  Heparin level 0.3-0.5 Monitor platelets by anticoagulation protocol: Yes  Plan:  1) Continue heparin at 1200/hr.  2) F/u daily heparin level, CBC 3) F/u feeding tube placement and ability to restart po coumadin  Christoper Fabian, PharmD, BCPS Clinical pharmacist, pager [phone]  07/25/2013 10:36 AM

## 2013-07-25 NOTE — Progress Notes (Signed)
Received called from [facility] RN stated that Per  CCM MDs, patient needed to be intubated to protect his airway and requested the RSI, vent, RT, and patient be prepare for intubation.  MD arrived, spoke with patient and caregiver, Per MD will proceed to assist in intubation.

## 2013-07-25 NOTE — Progress Notes (Signed)
After several attempts, patient was placed on [place] on 6L, Sats were in the upper 90s unless patient was coughing. Will monitor on Nasal Cannula.  Patient had pulled his NG out about 3-4 inches, NG was marked previously, will advance and obtain a stat KUB

## 2013-07-25 NOTE — Progress Notes (Signed)
After placing NG Tube under FLUORO, patient's sat's were in the mid 80s on 6 L Nasal Cannula, patient denied discomfort, placed patient on Vent Mask 50% on 15 L and paged Dr. Kittie Plater, per MD, will give Lasix 40mg  X 1 and will obtain a stat CXR.  Patient HAS NOT be able to clear his secretions (think, copious, and blood tinged) and his airway is not protected, d/w with MD this morning during rounds.   Will monitor  Side Note: Pleth on SPO2 was good and it was a good consistent waveform.

## 2013-07-25 NOTE — Progress Notes (Signed)
Post extubation patient was profusely bleeding from his mouth and nose, MD CCM was at bedside and aware.  Paged Dr. Kittie Plater and per him will turn Heparin off, will wait til bleeding subsides, the couple hours after bleeding stops, heparin will be restarted without a bolus. Will pass along to night shift RN.

## 2013-07-26 ENCOUNTER — Inpatient Hospital Stay (HOSPITAL_COMMUNITY): Payer: Medicare Other

## 2013-07-26 LAB — GLUCOSE, CAPILLARY
GLUCOSE-CAPILLARY: 101 mg/dL — AB (ref 70–99)
Glucose-Capillary: 102 mg/dL — ABNORMAL HIGH (ref 70–99)
Glucose-Capillary: 121 mg/dL — ABNORMAL HIGH (ref 70–99)
Glucose-Capillary: 98 mg/dL (ref 70–99)
Glucose-Capillary: 92 mg/dL (ref 70–99)
Glucose-Capillary: 116 mg/dL — ABNORMAL HIGH (ref 70–99)

## 2013-07-26 LAB — CBC
HCT: 34.4 % — ABNORMAL LOW (ref 39.0–52.0)
HEMATOCRIT: 30.1 % — AB (ref 39.0–52.0)
HEMATOCRIT: 30.2 % — AB (ref 39.0–52.0)
HEMOGLOBIN: 10.1 g/dL — AB (ref 13.0–17.0)
HEMOGLOBIN: 10.2 g/dL — AB (ref 13.0–17.0)
Hemoglobin: 11.6 g/dL — ABNORMAL LOW (ref 13.0–17.0)
MCH: 29.7 pg (ref 26.0–34.0)
MCH: 29.7 pg (ref 26.0–34.0)
MCH: 29.5 pg (ref 26.0–34.0)
MCHC: 33.7 g/dL (ref 30.0–36.0)
MCHC: 33.6 g/dL (ref 30.0–36.0)
MCHC: 33.8 g/dL (ref 30.0–36.0)
MCV: 88.2 fL (ref 78.0–100.0)
MCV: 88.5 fL (ref 78.0–100.0)
MCV: 87.3 fL (ref 78.0–100.0)
PLATELETS: 316 10*3/uL (ref 150–400)
Platelets: 246 10*3/uL (ref 150–400)
Platelets: 281 10*3/uL (ref 150–400)
RBC: 3.4 MIL/uL — ABNORMAL LOW (ref 4.22–5.81)
RBC: 3.9 MIL/uL — AB (ref 4.22–5.81)
RBC: 3.46 MIL/uL — AB (ref 4.22–5.81)
RDW: 14.8 % (ref 11.5–15.5)
RDW: 14.7 % (ref 11.5–15.5)
RDW: 14.5 % (ref 11.5–15.5)
WBC: 5.5 10*3/uL (ref 4.0–10.5)
WBC: 10.5 10*3/uL (ref 4.0–10.5)
WBC: 6.4 10*3/uL (ref 4.0–10.5)

## 2013-07-26 LAB — BASIC METABOLIC PANEL
BUN: 17 mg/dL (ref 6–23)
CHLORIDE: 96 meq/L (ref 96–112)
CO2: 26 mEq/L (ref 19–32)
Calcium: 9 mg/dL (ref 8.4–10.5)
Creatinine, Ser: 1.15 mg/dL (ref 0.50–1.35)
GFR calc Af Amer: 84 mL/min — ABNORMAL LOW (ref >90)
GFR calc non Af Amer: 73 mL/min — ABNORMAL LOW (ref >90)
GLUCOSE: 120 mg/dL — AB (ref 70–99)
Potassium: 3.5 mEq/L — ABNORMAL LOW (ref 3.7–5.3)
Sodium: 136 mEq/L — ABNORMAL LOW (ref 137–147)

## 2013-07-26 LAB — CARBOXYHEMOGLOBIN
CARBOXYHEMOGLOBIN: 1.7 % — AB (ref 0.5–1.5)
Methemoglobin: 1.5 % (ref 0.0–1.5)
O2 SAT: 58.8 %
TOTAL HEMOGLOBIN: 9.4 g/dL — AB (ref 13.5–18.0)

## 2013-07-26 LAB — LACTATE DEHYDROGENASE: LDH: 1124 U/L — ABNORMAL HIGH (ref 94–250)

## 2013-07-26 LAB — PHOSPHORUS: Phosphorus: 3.1 mg/dL (ref 2.3–4.6)

## 2013-07-26 LAB — PROTIME-INR
INR: 1.53 — ABNORMAL HIGH (ref 0.00–1.49)
PROTHROMBIN TIME: 18 s — AB (ref 11.6–15.2)

## 2013-07-26 LAB — HEPARIN LEVEL (UNFRACTIONATED): Heparin Unfractionated: <0.1 IU/mL — ABNORMAL LOW (ref 0.30–0.70)

## 2013-07-26 LAB — MAGNESIUM: Magnesium: 2 mg/dL (ref 1.5–2.5)

## 2013-07-26 MED ORDER — CHLORHEXIDINE GLUCONATE 0.12 % MT SOLN
15.0000 mL | Freq: Two times a day (BID) | OROMUCOSAL | Status: DC
  Administered 2013-07-26 – 2013-08-11 (×33): 15 mL via OROMUCOSAL
  Filled 2013-07-26 (×33): qty 15

## 2013-07-26 MED ORDER — BIOTENE DRY MOUTH MT LIQD
15.0000 mL | Freq: Four times a day (QID) | OROMUCOSAL | Status: DC
  Administered 2013-07-26 – 2013-08-11 (×56): 15 mL via OROMUCOSAL

## 2013-07-26 MED ORDER — HYDRALAZINE HCL 20 MG/ML IJ SOLN: 10.0000 mg | Freq: Four times a day (QID) | INTRAMUSCULAR | Status: DC | PRN

## 2013-07-26 MED ORDER — POTASSIUM CHLORIDE 20 MEQ/15ML (10%) PO LIQD
ORAL | Status: AC
  Filled 2013-07-26: qty 30

## 2013-07-26 MED ORDER — POTASSIUM CHLORIDE 20 MEQ/15ML (10%) PO LIQD
40.0000 meq | Freq: Once | ORAL | Status: AC
  Administered 2013-07-26: 40 meq
  Filled 2013-07-26: qty 30

## 2013-07-26 NOTE — Plan of Care (Signed)
Problem: Phase I Progression Outcomes Goal: VTE prophylaxis Outcome: Completed/Met Date Met:  07/26/13 Heparin gtts

## 2013-07-26 NOTE — Progress Notes (Signed)
PT Cancellation Note  Patient Details Name: Lennard Tuell MRN: [id number] DOB: 1960/12/17   Cancelled Treatment:    Reason Eval/Treat Not Completed: Medical issues which prohibited therapy. Intubated and sedated   Vielka Klinedinst 07/26/2013, 10:31 AM Pager [phone]

## 2013-07-26 NOTE — Progress Notes (Addendum)
Patient ID: [name], male   DOB: 01/31/1961, 53 y.o.   MRN: [id number] Advanced Heart Failure Rounding Note  [name] is 53 y/o male with a history of severe CHF due to NICM EF 10-15%, and severe MR s/p HM II [facility]. His medical history also consists of CRI, NSVT s/p ICD and LV thrombus.   Admitted 1/24 with NSTEMI felt secondary due embolic phenomenon due to pump thrombosis.  LDH high around 900.  On way to cath lab 1/26 developed acute onset of aphasia and left-sided hemiparesis. CT negative for bleed. Taken emergently to neuro-interventional suite but no amenable lesions. Remains intubated on levophed. Remains on propofol gtt, unable to assess neuro status.  S/P TEE 1/28. No evidence of clot but with RV failure.   Extubated 1/30. Last night emergent intubation d/t not able to protect airway. Copious blood tinged secretions and frank blood coming out of mouth. Multiple Clots. Levophed turned back on last night d/t PI 2s. OGT placed under direct laryngoscopy. Hgb stable 11.6  MAPs 90-100s LDH:  976>1568>1165>1124 Co-ox: 55.3>64.4>83>73>68>63>69> 62> 59 INR 1.5  CVP ~11  VAD interrogated personally.  Flow 3.6 , Speed 9400 PI 6.6,  Power 5.0   Events: multiple PI events    Filed Vitals:   07/26/13 1400 07/26/13 1500 07/26/13 1600 07/26/13 1700  BP:      Pulse:    73  Temp:      TempSrc:      Resp: 19 23 24 21   Height:      Weight:      SpO2:    100%    Intake/Output Summary (Last 24 hours) at 07/26/13 1800 Last data filed at 07/26/13 1700  Gross per 24 hour  Intake 1842.72 ml  Output    705 ml  Net 1137.72 ml    LABS: Basic Metabolic Panel:  Recent Labs  30/86/5700/07/08 0515 07/26/13 0030  NA 141 136*  K 4.0 3.5*  CL 101 96  CO2 28 26  GLUCOSE 98 120*  BUN 13 17  CREATININE 1.13 1.15  CALCIUM 8.9 9.0  MG 2.2 2.0  PHOS 3.7 3.1   Liver Function Tests: No results found for this basename: AST, ALT, ALKPHOS, BILITOT, PROT, ALBUMIN,  in the last  72 hours No results found for this basename: LIPASE, AMYLASE,  in the last 72 hours CBC:  Recent Labs  07/26/13 0030 07/26/13 1100  WBC 10.5 5.5  HGB 11.6* 10.1*  HCT 34.4* 30.1*  MCV 88.2 88.5  PLT 316 246   Cardiac Enzymes: No results found for this basename: CKTOTAL, CKMB, CKMBINDEX, TROPONINI,  in the last 72 hours RADIOLOGY: Dg Chest Portable 1 View  07/17/2013   CLINICAL DATA:  Left-sided chest pain for 3 days, weakness and dizziness.  EXAM: PORTABLE CHEST - 1 VIEW  COMPARISON:  08/03/2012  FINDINGS: Evidence of median sternotomy noted with LVAD in place. Left-sided defibrillator noted. Mild prominence of the cardiac silhouette persists. Tricuspid valvuloplasty reidentified. Trace left pleural fluid or thickening persists. No new pulmonary opacity.  IMPRESSION: No new acute abnormality. Stable appearance of trace left pleural fluid or thickening.   Electronically Signed   By: Christiana PellantGretchen  Green M.D.   On: 07/17/2013 10:12    PHYSICAL EXAM General: Intubated and sedated. Heavy oral secretions, frank blood and clots Neck: JVP to jaw Lungs: Diffuse rhonchi CV: LVAD pump hum normal.  Trace edema.   Abdomen: Soft, nontender, no hepatosplenomegaly, no distention.  Neurologic: awake interactive.  Expressive aphasia. L sided paralysis.  Extremities: No clubbing or cyanosis.   TELEMETRY: NSR 70s   Assessment:   1. Acute embolic CVA 2. NSTEMI - Likely pump thrombosis with coronary embolus.  2. Chronic systolic HF s/p HM II VAD  3. NICM EF 15%  4. Hyperkalemia  5. HTN - MAP 70s currently  6. Acute respiratory failure 7. Shock - suspect mixed picture RV failure/vasodilatory  Plan/Discussion:    He has had NSTEMI and acute CVA due to pump thrombosis/embolism. Now with expressive aphasia and dense L hemiplegia.    Re-intubated last night d/t not being able to protect airway. Now with copious amounts of blood and clots from his mouth. Heparin off. Right heart failure worse now that  he is back on vent and requiring levophed again. Given active bleeding need to hold heparin. May need ENT to see. Follow CBCs.   He is sedated but will arouse and follow commands.   Neuro and CCM recommend PEG/trach and possible Palliative Care consult.   I spoke with Dr. Pearlean Brownie who feels he will likely recover speech and ability to protect airway over time (months) but L hemiplegia will probably not get much better.   Outlook appears grim. Will d/w further with him and his mother.    The patient is critically ill with multiple organ systems failure and requires high complexity decision making for assessment and support, frequent evaluation and titration of therapies, application of advanced monitoring technologies and extensive interpretation of multiple databases.   Critical Care Time devoted to patient care services described in this note is 35 Minutes.  I reviewed the LVAD parameters from today, and compared the results to the patient's prior recorded data. No programming changes were made. The LVAD is functioning within specified parameters. The patient performs LVAD self-test daily. LVAD interrogation was negative for any significant power changes, alarms or PI events/speed drops. LVAD equipment check completed and is in good working order. Back-up equipment present. LVAD education done on emergency procedures and precautions and reviewed exit site care.  Lajuana Patchell,MD 6:00 PM

## 2013-07-26 NOTE — Progress Notes (Signed)
MD called and notified of pt PF --- PI 2.1 lack of wave on A-line, drop in MAP to 76, Ali at bedside orders for 250cc bolus and to increase levophed to . Map increased back to 100 levophed is slowly being titrated down per MAP. Order for SCD's and CBC from CCM received. Currently waiting CBC results.

## 2013-07-26 NOTE — Progress Notes (Addendum)
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 He is s/p LVAD placement at [facility] March 2013, as bridge to transplant. Admitted for evaluation of new NSTEMI felt possibly to be related to pump thrombosis. Hosp course complicated by episodes of CP and hypotension on 1/24 pm hours. 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 PCCM asked to see post-op for vent weaning.    SIGNIFICANT EVENTS / STUDIES:  1/24: admitted w/ CP and trop rise. Did have sub-therapeutic INR at home so concern was for pump thrombosis.  1/24: evening had CP, rising Trop I, got mso4 and NTG>>>got hypotensive requiring pressors.  1/25 INR supra-therapeutic. Coumadin stopped, heparin started 1/26: CT head: negative for bleed.  1/26:cerebral arteriogram:  acute right MCA embolic CVA. Not amendable to extraction due to distal location.  1/30 Extubated but airway protection is questionable at best 2/01 Reintubated  LINES / TUBES: LVAD OETT 1/26>>>1/30 Right PICC 1/25>>> OETT 2/01 >>>  CULTURES: Resp 1/27 >> few GNR >> normal flora  ANTIBIOTICS: 1/27 zosyn (aspiration, GNR) >>   SUBJECTIVE:  Re-intubated 2/01.  Bloody oral secretions.  Tolerates pressure support.  VITAL SIGNS: Temp:  [98 F (36.7 C)-98.8 F (37.1 C)] 98.8 F (37.1 C) (02/02 0804) Pulse Rate:  [67-88] 71 (02/02 0800) Resp:  [14-28] 14 (02/02 0800) BP: (104-170)/(90-116) 114/90 mmHg (02/02 0415) SpO2:  [96 %-100 %] 100 % (02/02 0800) FiO2 (%):  [40 %-100 %] 40 % (02/02 0828) Weight:  [183 lb 13.8 oz (83.4 kg)] 183 lb 13.8 oz (83.4 kg) (02/02 0300) HEMODYNAMICS: CVP:  [6 mmHg-18 mmHg] 13 mmHg VENTILATOR  SETTINGS: Vent Mode:  [-] PRVC FiO2 (%):  [40 %-100 %] 40 % Set Rate:  [14 bmp-20 bmp] 14 bmp Vt Set:  [480 mL-500 mL] 480 mL PEEP:  [5 cmH20] 5 cmH20 Plateau Pressure:  [17 cmH20-21 cmH20] 19 cmH20 INTAKE / OUTPUT: Intake/Output     02/01 0701 - 02/02 0700 02/02 0701 - 02/03 0700   I.V. (mL/kg) 1323.7 (15.9) 124.1 (1.5)   NG/GT 90    IV Piggyback 125 12.5   Total Intake(mL/kg) 1538.7 (18.4) 136.6 (1.6)   Urine (mL/kg/hr) 2810 (1.4) 75 (0.4)   Total Output 2810 75   Net -1271.3 +61.6         PHYSICAL EXAMINATION: General:  No distress Neuro:  Open's eyes with stimulation, RASS -1 HEENT: ETT in place, bloody secretions with clots from oral cavity >> unable to localize source on visual inspection Cardiovascular:  LVAD pump hum. No edema  Lungs:  Coarse scattered rhonchi  Abdomen:  Soft, nontender  Musculoskeletal:  Intact  Skin:  Intact   LABS:  CBC  Recent Labs Lab 07/24/13 0317 07/25/13 0515 07/26/13 0030  WBC 7.9 6.4 10.5  HGB 11.9* 11.3* 11.6*  HCT 35.5* 34.0* 34.4*  PLT 232 257 316   Coag's  Recent Labs Lab 07/24/13 0317 07/25/13 0515 07/26/13 0030  INR 1.49 1.61* 1.53*   BMET  Recent Labs Lab 07/24/13 0317 07/25/13 0515 07/26/13 0030  NA 137 141 136*  K 3.7 4.0 3.5*  CL 100 101 96  CO2 25 28 26   BUN 13 13 17  CREATININE 1.05 1.13 1.15  GLUCOSE 111* 98 120*   Electrolytes  Recent Labs Lab 07/24/13 0317 07/25/13 0515 07/26/13 0030  CALCIUM 8.6 8.9 9.0  MG  --  2.2 2.0  PHOS  --  3.7 3.1   Sepsis Markers  Recent Labs Lab 07/20/13 1030 07/21/13 0415 07/22/13 0414  PROCALCITON 0.13 0.19 0.17   ABG  Recent Labs Lab 07/19/13 1301 07/25/13 2007  PHART 7.476* 7.575*  PCO2ART 36.1 33.9*  PO2ART 210.0* 384.0*   Glucose  Recent Labs Lab 07/25/13 1213 07/25/13 1611 07/25/13 1628 07/25/13 2109 07/26/13 0101 07/26/13 0523  GLUCAP 109* 92 99 103* 102* 101*    Imaging Dg Chest Port 1 View  07/26/2013   CLINICAL DATA:   Respiratory failure.  EXAM: PORTABLE CHEST - 1 VIEW  COMPARISON:  07/25/2013 .  FINDINGS: Endotracheal tube, right PICC line, NG tube in good anatomic position. Prior median sternotomy. Cardiomegaly, no pulmonary venous congestion. Cardiac pacer noted with stable lead tip positions. Left ventricular assist device noted in stable position. Mild bibasilar atelectasis and/or infiltrates noted. Small left pleural effusion. No pneumothorax.  IMPRESSION: 1. Stable line and tube positions. 2. Stable cardiomegaly. Prior CABG. Cardiac pacer in stable position. Left ventricular assist device in stable position. 3. Mild bibasilar atelectasis versus infiltrates. Small left pleural effusion appear   Electronically Signed   By: Maisie Fus  Register   On: 07/26/2013 07:22   Dg Chest Port 1 View  07/25/2013   CLINICAL DATA:  Endotracheal tube placement  EXAM: PORTABLE CHEST - 1 VIEW  COMPARISON:  Film performed earlier today  FINDINGS: Endotracheal tube is seen with tip 2.5 cm above the carina. Other lines and tubes are unchanged. Mild cardiac enlargement stable. Vascular pattern normal. Mild bibasilar atelectasis.  IMPRESSION: No significant change other than endotracheal tube placement.   Electronically Signed   By: Esperanza Heir M.D.   On: 07/25/2013 21:02   Dg Chest Port 1 View  07/25/2013   CLINICAL DATA:  Respiratory distress. Chronic heart failure with left ventricular assist device.  EXAM: PORTABLE CHEST - 1 VIEW  COMPARISON:  DG CHEST 1V PORT dated 07/24/2013; DG CHEST 1V PORT dated 07/23/2013; DG CHEST 1V PORT dated 07/22/2013; DG CHEST 1V PORT dated 07/21/2013; DG CHEST 1V PORT dated 07/20/2013; DG CHEST 1V PORT dated 07/18/2013  FINDINGS: Feeding tube courses below the diaphragm though its tip is not included on the image. Right arm PICC tip overlies the lower SVC near the cavoatrial junction. Left subclavian pacing defibrillator unchanged and appears intact. Prior sternotomy. Cardiac silhouette moderately enlarged but  stable. Mild pulmonary venous hypertension without overt edema. Suboptimal inspiration with mild atelectasis in the lung bases, unchanged since yesterday. No new pulmonary parenchymal abnormalities. Left ventricular assist device.  IMPRESSION: 1. Suboptimal inspiration accounts for mild bibasilar atelectasis, stable. 2. Stable cardiomegaly. Pulmonary venous hypertension without overt edema. 3. Support apparatus satisfactory. 4. No new abnormalities.   Electronically Signed   By: Hulan Saas M.D.   On: 07/25/2013 12:54   Dg Abd Portable 1v  07/25/2013   CLINICAL DATA:  Feeding tube placement, patient pulled, recheck position  EXAM: PORTABLE ABDOMEN - 1 VIEW  COMPARISON:  07/25/2013  FINDINGS: Tip of feeding tube projects at the expected position of the junction of the second and third portions of the duodenum.  Left ventricular assist device noted.  Bowel gas pattern normal.  IMPRESSION: Tip of feeding tube projects over the expected position of the junction of the second and third portions  of the duodenum.  This has been partially retracted since the previous exam.   Electronically Signed   By: Ulyses SouthwardMark  Boles M.D.   On: 07/25/2013 17:15   Dg Intro Long Gi Tube  07/25/2013   CLINICAL DATA:  Patient with left ventricular assist device due to chronic heart failure, non ST elevation MI during this admission, in need of enteric feeding tube which could not be placed at bedside due to inability to manage excessive secretions.  EXAM: INTRO LONG GI TUBE (TRANSPYLORIC FEEDING TUBE PLACEMENT)  TECHNIQUE: Viscous lidocaine was used to anesthetize the right naris. A 7 [place] Kangaroo feeding tube was advanced via the right naris through the esophagus and into the stomach with intermittent fluoroscopic guidance. The guide wire that is part of the catheter set was used to advanced the tube such that its tip is at the ligament of Treitz. This was confirmed with an injection of contrast. The catheter was secured to the  patient's nose by the nurse. No apparent immediate complications.  CONTRAST:  20mL Omnipaque Iohexol 300 MG/ML via the feeding tube.  COMPARISON:  None.  FLUOROSCOPY TIME:  3 min 8 sec  FINDINGS: Contrast injection confirmed that the tip of the feeding tube is at the ligament of Treitz, with the side holes in the 3rd and 4th portion of the duodenum.  IMPRESSION: Uncomplicated placement of a 7 [place] Kangaroo feeding tube with its tip at the ligament of Treitz. The tube is ready for immediate use.   Electronically Signed   By: Hulan Saashomas  Lawrence M.D.   On: 07/25/2013 11:51    ASSESSMENT / PLAN:  PULMONARY A: Acute respiratory failure 2nd to Rt MCA and difficulty with airway secretions.  Re-intubated 2/01. Bloody oral secretions ?from traumatic intubation 2/01. P:   -pressure support wean as tolerated -?if he is trach candidate >> need to d/w neuro about his long term neuro recovery prognosis -f/u CXR -primary team to consult ENT to assess bloody oral secretions  CARDIOVASCULAR A:  NSTEMI (felt to be Pump thrombosis) Acute on Chronic systolic HF (EF 10-15%) [facility]/ [facility]  Cardiogenic shock  P - LVAD per cards, current flow rate 4.0 - Levophed for MAP >70 - Asa, plavix and heparin per cards >> on hold 2/01 in setting of bloody oral secretions  RENAL A:   AKI in setting of hypoperfusion, s/p contrast load for cerebral arteriogram >> improved. P:   -MAP goals >65 -F/u chemistry  -Renal dose meds   GASTROINTESTINAL A:   Nutrition. P:   -PPI for SUP -resume tube feeds as tolerated 2/1  HEMATOLOGIC A:   Chronic anticoagulation (on coumadin for LVAD) Concern about acute pump related embolic events P:  - Heparin per pharmacy >> on hold 2/1 due to bloody oral secretions  INFECTIOUS A:   ? aspiration pneumonia P:   -F/u cxr, follow. -Continue zosyn, total abx course planned 8 days (7/8).  ENDOCRINE A:   Mild hyperglycemia  P:   -SSI if glucose >  150.  NEUROLOGIC A:   Right MCA embolic CVA. Not amendable to IR  P:   -per neuro -sedation protocol while on vent with precedex  CC time 35 minutes.  D/w Dr. [name].  Coralyn HellingVineet Krystofer Hevener, MD [facility] Pulmonary/Critical [facility] 07/26/2013, 9:20 AM Pager:  [phone] After 3pm call: [phone]

## 2013-07-26 NOTE — Progress Notes (Signed)
SLP Cancellation Note  Patient Details Name: [name] MRN: [id number] DOB: Apr 03, 1961   Cancelled treatment:       Reason Eval/Treat Not Completed: Medical issues which prohibited therapy (Intubated and sedated)  Ferdinand Lango MA, CCC-SLP [phone]  Narcissa Melder [name] 07/26/2013, 11:01 AM

## 2013-07-26 NOTE — Progress Notes (Signed)
ANTICOAGULATION/ANTIBIOTIC CONSULT NOTE - Follow Up Consult  Pharmacy Consult for heparin, Zosyn Indication: LVAD thrombosis/CVA + aspiration pneumonia  Allergies  Allergen Reactions  . Ace Inhibitors Cough  . Lexapro [Escitalopram Oxalate] Other (See Comments)    somnolence    Patient Measurements: Height: 5\' 4"  (162.6 cm) Weight: 183 lb 13.8 oz (83.4 kg) IBW/kg (Calculated) : 59.2 Heparin dosing wt: 77 kg   Vital Signs: Temp: 97.4 F (36.3 C) (02/02 1151) Temp src: Oral (02/02 1151) BP: 114/90 mmHg (02/02 0415) Pulse Rate: 75 (02/02 1200)  Labs:  Recent Labs  07/24/13 0317 07/25/13 0515 07/26/13 0030 07/26/13 1100  HGB 11.9* 11.3* 11.6* 10.1*  HCT 35.5* 34.0* 34.4* 30.1*  PLT 232 257 316 246  LABPROT 17.6* 18.7* 18.0*  --   INR 1.49 1.61* 1.53*  --   HEPARINUNFRC 0.33 0.46 <0.10*  --   CREATININE 1.05 1.13 1.15  --     Estimated Creatinine Clearance: 74.9 ml/min (by C-G formula based on Cr of 1.15).  . sodium chloride 20 mL/hr at 07/26/13 1100  . amiodarone (NEXTERONE PREMIX) 360 mg/200 mL dextrose 30 mg/hr (07/26/13 1300)  . dexmedetomidine 1.2 mcg/kg/hr (07/26/13 1300)  . nitroGLYCERIN Stopped (07/17/13 1800)  . norepinephrine (LEVOPHED) Adult infusion 3 mcg/min (07/26/13 1300)    Assessment: 53 yo male with LVAD, suspected pump thombosis, admitted for NSTEMI, then developed acute CVA prior to cath. Coumadin on hold due to NPO with IV heparin bridge. INR 1.53. Heparin level undetectable this AM, but heparin was turned off yesterday.  Copious blood/clots from mouth following reintubation.  Heparin remains on hold.  Also on Zosyn for aspiration PNA, cultures negative.  Today is day #7  Goal of Therapy:  Heparin level 0.3-0.5 Monitor platelets by anticoagulation protocol: Yes  Plan:  1) Continue holding Heparin and Coumadin. 2) Continue Zosyn 3.375g IV q 8 hrs.  Consider stopping after 8 days? 3) F/u plans to resume anticoagulation.  Tad Moore, BCPS  Clinical Pharmacist Pager [phone]  07/26/2013 2:03 PM

## 2013-07-26 NOTE — Progress Notes (Signed)
MD called and updated on HGB drop to 10.1 from 11.6 at midnight last night. Notified of high MAPS in the 120s PRN order for hydralazine given will administer per order. Pt currently awake and following commands with right side of body on vent. HF team to come back by as soon as they can.

## 2013-07-26 NOTE — Progress Notes (Signed)
Stroke Team Progress Note  HISTORY [name] is a 53 y.o. male left-handed male who presented with sudden onset left-sided weakness and aphasia. He was admitted 07/17/2013 with chest pain. He has a history of congestive heart failure and has a left ventricular assist device in place. His INR was low at home - his pump got clotted (likely causing MI), and he loaded himself and became supratherapeutic 4.97 in hospital. He was given vitamin K yesterday 07/18/2013, his INR came down to 1.9. He was then  started on heparin. Around 7:40 AM 07/19/2013, the nurse helped him to get dressed and stated that he seemed sleepy, he was moving his left side well. The patient endorsed that his symptoms started after he woke up this morning, but woke up 07/19/2013 in his normal state. He appeared to understand well. Patient was not administerd TPA secondary to being on systemic heparin and INR 1.9  SUBJECTIVE Traumatic extubation yesterday. RN at bedside.had oral bleeding. Now off aspirin and heparin  OBJECTIVE Most recent Vital Signs: Filed Vitals:   07/26/13 0800 07/26/13 0804 07/26/13 0900 07/26/13 1000  BP:      Pulse: 71  68 47  Temp:  98.8 F (37.1 C)    TempSrc:  Oral    Resp: 14  14 22   Height:      Weight:      SpO2: 100%  100% 100%   CBG (last 3)   Recent Labs  07/25/13 2109 07/26/13 0101 07/26/13 0523  GLUCAP 103* 102* 101*    IV Fluid Intake:   . sodium chloride 20 mL/hr at 07/26/13 1000  . amiodarone (NEXTERONE PREMIX) 360 mg/200 mL dextrose 30 mg/hr (07/26/13 1000)  . dexmedetomidine 1.2 mcg/kg/hr (07/26/13 1030)  . heparin Stopped (07/25/13 1830)  . nitroGLYCERIN Stopped (07/17/13 1800)  . norepinephrine (LEVOPHED) Adult infusion 2.027 mcg/min (07/26/13 1000)    MEDICATIONS  . allopurinol  100 mg Oral Daily  . antiseptic oral rinse  15 mL Mouth Rinse QID  . chlorhexidine  15 mL Mouth Rinse BID  . feeding supplement (PRO-STAT SUGAR FREE 64)  30 mL Per Tube TID  . feeding  supplement (VITAL HIGH PROTEIN)  1,000 mL Per Tube Q24H  . insulin aspart  0-15 Units Subcutaneous Q4H  . magnesium oxide  400 mg Oral Daily  . pantoprazole (PROTONIX) IV  40 mg Intravenous Q24H  . piperacillin-tazobactam (ZOSYN)  IV  3.375 g Intravenous Q8H  . potassium chloride      . sodium chloride  10-40 mL Intracatheter Q12H   PRN:  acetaminophen, fentaNYL, midazolam, nitroGLYCERIN, ondansetron (ZOFRAN) IV  Diet:  NPO , tube feedings Activity:  Bedrest DVT Prophylaxis:  IV heparin  CLINICALLY SIGNIFICANT STUDIES Basic Metabolic Panel:   Recent Labs Lab 07/25/13 0515 07/26/13 0030  NA 141 136*  K 4.0 3.5*  CL 101 96  CO2 28 26  GLUCOSE 98 120*  BUN 13 17  CREATININE 1.13 1.15  CALCIUM 8.9 9.0  MG 2.2 2.0  PHOS 3.7 3.1   Liver Function Tests:  No results found for this basename: AST, ALT, ALKPHOS, BILITOT, PROT, ALBUMIN,  in the last 168 hours CBC:   Recent Labs Lab 07/25/13 0515 07/26/13 0030  WBC 6.4 10.5  HGB 11.3* 11.6*  HCT 34.0* 34.4*  MCV 89.5 88.2  PLT 257 316   Coagulation:   Recent Labs Lab 07/23/13 0245 07/24/13 0317 07/25/13 0515 07/26/13 0030  LABPROT 17.7* 17.6* 18.7* 18.0*  INR 1.50* 1.49 1.61* 1.53*  Cardiac Enzymes:  No results found for this basename: CKTOTAL, CKMB, CKMBINDEX, TROPONINI,  in the last 168 hours Urinalysis: No results found for this basename: COLORURINE, APPERANCEUR, LABSPEC, PHURINE, GLUCOSEU, HGBUR, BILIRUBINUR, KETONESUR, PROTEINUR, UROBILINOGEN, NITRITE, LEUKOCYTESUR,  in the last 168 hours Lipid Panel    Component Value Date/Time   CHOL 166 07/20/2013 0020   TRIG 96 07/20/2013 0020   HDL 63 07/20/2013 0020   CHOLHDL 2.6 07/20/2013 0020   VLDL 19 07/20/2013 0020   LDLCALC 84 07/20/2013 0020   HgbA1C  Lab Results  Component Value Date   HGBA1C 5.7* 07/19/2013    Urine Drug Screen:     Component Value Date/Time   LABOPIA NONE DETECTED 06/21/2008 1300   COCAINSCRNUR NONE DETECTED 06/21/2008 1300   LABBENZ  NONE DETECTED 06/21/2008 1300   AMPHETMU NONE DETECTED 06/21/2008 1300   THCU NONE DETECTED 06/21/2008 1300   LABBARB  Value: NONE DETECTED        DRUG SCREEN FOR MEDICAL PURPOSES ONLY.  IF CONFIRMATION IS NEEDED FOR ANY PURPOSE, NOTIFY LAB WITHIN 5 DAYS. 06/21/2008 1300    Alcohol Level: No results found for this basename: ETH,  in the last 168 hours   CT of the brain   07/20/2013 1. New hypodensities in the left basal ganglia and left thalamus,  concerning for evolving acute infarcts. Smaller foci of hypoattenuation in the right caudate and thalamus may also represent acute ischemia.  2. New paranasal sinus mucosal disease. 07/19/2013    1. No acute intracranial process. 2. Very minimal amount of geographic subcortical encephalomalacia within in the anterior lateral aspect of the left temporal lobe, at the site of the patient's prior hemorrhagic contusion.   Cerebral Angiogram 07/20/2013 Rt CFa approach. Distal small filling defects in the the orbitofrontal br of RT MCA sup division,and tertiary angular division of RT MCA  MRI of the brain    2D Echocardiogram  EF 10%. Diffuse kypokinesis  TEE 07/21/2013 no evidence of clot but with RV failure.   CXR   07/26/2013 1. Stable line and tube positions. 2. Stable cardiomegaly. Prior CABG. Cardiac pacer in stable position. Left ventricular assist devicein stable position.  3. Mild bibasilar atelectasis versus infiltrates. Small left pleural effusion appear  EKG  sinus tachycardia. For complete results please see formal report.   Therapy Recommendations pending  Physical Exam   Young african [place] male sedated and intubated. . Afebrile. Head is nontraumatic. Neck is supple without bruit.. Cardiac exam no murmur or gallop. Lungs are clear to auscultation. Distal pulses are well felt. LVAD right side abdomen Neurological Exam : Drowsy but awakens  and follows commands well. Pupils 3 mm sluggishly reactive. Doll's eye movements are sluggish.Eyes  partially open and will blink to threat and follow gaze. No facial grimacing to pain. No spontaneous extremity movements.  L sided hemiplegia, following commands on R side.   ASSESSMENT [name] is a 53 y.o. male presenting with left sided weakness.  Imaging supports left basal ganglia and thalamic infarcts larger than right basal ganglia subcortical  infarcts. Infarcts cardioembolic secondary to potential sources of atrial fibrillation, LVAD clot and/or acute MI.  On aspirin 81 mg orally every day, clopidogrel 75 mg orally every day and warfarin prior to admission. Now on aspirin suppositories and intravenous heparin for secondary stroke prevention. Patient with resultant left arm hemiplegia, left leg hemiparesis, dysphagia, VDRF - sedated this am.   Respiratory failure, extubated 07/25/2013 with profuse oral bleeding, reintubated same day to protect  airway - IV heparin, aspirin and plavix on hold.   Systolic HF, EF 10%, has LVAD. On transplant list prior to admission. Given new stroke, no longer a transplant candidate.  new onset atrial fibrillation with RVR  NSTEMI - Elevated troponins - will manage medically hypertension Chronic RI Hyperlipidemia, LDL 84, on no statin PTA, now on no statin, at goal LDL < 100  obstructive sleep apnea Hx migraines Bipolar affective disorder TEE performed 07/21/2013. Ejection fraction 15-20%. No obvious clot was noted.  Hospital day # 9  TREATMENT/PLAN  Agree with holding antiplatelets and angicoagulant given oral bleeding. Being off puts pt at risk for recurrent stroke. Recommend resumption when medically stable.   would consider palliative care consult  Annie Main, MSN, RN, ANVP-BC, ANP-BC, GNP-BC [facility] Pager: [phone] 07/26/2013 11:14 AM  I have personally obtained a history, examined the patient, evaluated imaging results, and formulated the assessment and plan of care. I agree with the above.  Delia Heady, MD

## 2013-07-26 NOTE — Progress Notes (Signed)
OT Cancellation Note  Patient Details Name: [name] MRN: [id number] DOB: [birth year]   Cancelled Treatment:    Reason Eval/Treat Not Completed: Patient not medically ready.  Pt currently intubated and sedated.   Jeani Hawking, OTR/L [phone]  07/26/2013, 9:45 AM

## 2013-07-26 NOTE — Progress Notes (Signed)
MD made aware of 100cc OG output of bright red blood with clots in it. MD stated he is pretty sure it is drainage from his mouth and that it was OK to start tube feedings. Cardiology made aware of pt UOP 15-20cc/hr. Pt is continuing to have frank red blood and major clots coming out of his mouth around ETT. MD's have been in the room to assess pt and no further orders were given at this time. CBC is to be drawn to recheck H&H. Vital signs stable and pt not complaining of pain. Family has been updated on pt status from a nursing standpoint. Will continue to monitor.

## 2013-07-27 ENCOUNTER — Encounter (HOSPITAL_COMMUNITY): Payer: Self-pay | Admitting: Anesthesiology

## 2013-07-27 ENCOUNTER — Encounter (HOSPITAL_COMMUNITY): Payer: Self-pay | Admitting: Certified Registered"

## 2013-07-27 ENCOUNTER — Inpatient Hospital Stay (HOSPITAL_COMMUNITY): Payer: Medicare Other

## 2013-07-27 ENCOUNTER — Encounter (HOSPITAL_COMMUNITY)
Admission: EM | Disposition: A | Discharge status: Rehabilitation Facility | Payer: Self-pay | Source: Home / Self Care | Attending: Internal Medicine

## 2013-07-27 DIAGNOSIS — T82897A Other specified complication of cardiac prosthetic devices, implants and grafts, initial encounter: Secondary | ICD-10-CM | POA: Diagnosis not present

## 2013-07-27 DIAGNOSIS — R079 Chest pain, unspecified: Secondary | ICD-10-CM | POA: Diagnosis not present

## 2013-07-27 LAB — GLUCOSE, CAPILLARY
GLUCOSE-CAPILLARY: 113 mg/dL — AB (ref 70–99)
Glucose-Capillary: 111 mg/dL — ABNORMAL HIGH (ref 70–99)
Glucose-Capillary: 117 mg/dL — ABNORMAL HIGH (ref 70–99)
Glucose-Capillary: 118 mg/dL — ABNORMAL HIGH (ref 70–99)
Glucose-Capillary: 128 mg/dL — ABNORMAL HIGH (ref 70–99)
Glucose-Capillary: 145 mg/dL — ABNORMAL HIGH (ref 70–99)
Glucose-Capillary: 146 mg/dL — ABNORMAL HIGH (ref 70–99)

## 2013-07-27 LAB — BASIC METABOLIC PANEL
BUN: 19 mg/dL (ref 6–23)
CO2: 25 mEq/L (ref 19–32)
Calcium: 7.8 mg/dL — ABNORMAL LOW (ref 8.4–10.5)
Chloride: 103 mEq/L (ref 96–112)
Creatinine, Ser: 0.94 mg/dL (ref 0.50–1.35)
GFR calc Af Amer: >90 mL/min (ref >90)
GFR calc non Af Amer: >90 mL/min (ref >90)
GLUCOSE: 177 mg/dL — AB (ref 70–99)
POTASSIUM: 3.4 meq/L — AB (ref 3.7–5.3)
Sodium: 139 mEq/L (ref 137–147)

## 2013-07-27 LAB — PROTIME-INR
INR: 2.09 — ABNORMAL HIGH (ref 0.00–1.49)
INR: 1.52 — ABNORMAL HIGH (ref 0.00–1.49)
PROTHROMBIN TIME: 22.8 s — AB (ref 11.6–15.2)
Prothrombin Time: 17.9 seconds — ABNORMAL HIGH (ref 11.6–15.2)

## 2013-07-27 LAB — CBC
HCT: 29.1 % — ABNORMAL LOW (ref 39.0–52.0)
Hemoglobin: 9.8 g/dL — ABNORMAL LOW (ref 13.0–17.0)
MCH: 29.7 pg (ref 26.0–34.0)
MCHC: 33.7 g/dL (ref 30.0–36.0)
MCV: 88.2 fL (ref 78.0–100.0)
Platelets: 305 10*3/uL (ref 150–400)
RBC: 3.3 MIL/uL — ABNORMAL LOW (ref 4.22–5.81)
RDW: 14.7 % (ref 11.5–15.5)
WBC: 6.9 10*3/uL (ref 4.0–10.5)

## 2013-07-27 LAB — CARBOXYHEMOGLOBIN
Carboxyhemoglobin: 1.7 % — ABNORMAL HIGH (ref 0.5–1.5)
Methemoglobin: 1.6 % — ABNORMAL HIGH (ref 0.0–1.5)
O2 Saturation: 61.9 %
TOTAL HEMOGLOBIN: 10.1 g/dL — AB (ref 13.5–18.0)

## 2013-07-27 LAB — APTT: APTT: 39 s — AB (ref 24–37)

## 2013-07-27 LAB — LACTATE DEHYDROGENASE: LDH: 934 U/L — AB (ref 94–250)

## 2013-07-27 SURGERY — CANCELLED PROCEDURE

## 2013-07-27 MED ORDER — FENTANYL CITRATE 0.05 MG/ML IJ SOLN
100.0000 ug | Freq: Once | INTRAMUSCULAR | Status: AC
  Administered 2013-07-27: 100 ug via INTRAVENOUS

## 2013-07-27 MED ORDER — MIDAZOLAM HCL 2 MG/2ML IJ SOLN
INTRAMUSCULAR | Status: AC
  Filled 2013-07-27: qty 2

## 2013-07-27 MED ORDER — MIDAZOLAM HCL 2 MG/2ML IJ SOLN
4.0000 mg | Freq: Once | INTRAMUSCULAR | Status: AC
  Administered 2013-07-28: 4 mg via INTRAVENOUS
  Filled 2013-07-27: qty 4

## 2013-07-27 MED ORDER — FENTANYL CITRATE 0.05 MG/ML IJ SOLN
INTRAMUSCULAR | Status: AC
  Administered 2013-07-27: 100 ug via INTRAVENOUS
  Filled 2013-07-27: qty 2

## 2013-07-27 MED ORDER — ETOMIDATE 2 MG/ML IV SOLN
40.0000 mg | Freq: Once | INTRAVENOUS | Status: DC
  Filled 2013-07-27 (×2): qty 20

## 2013-07-27 MED ORDER — VECURONIUM BROMIDE 10 MG IV SOLR
10.0000 mg | Freq: Once | INTRAVENOUS | Status: AC
  Administered 2013-07-28: 10 mg via INTRAVENOUS
  Filled 2013-07-27: qty 10

## 2013-07-27 MED ORDER — FENTANYL CITRATE 0.05 MG/ML IJ SOLN
200.0000 ug | Freq: Once | INTRAMUSCULAR | Status: AC
  Administered 2013-07-28: 200 ug via INTRAVENOUS
  Filled 2013-07-27: qty 4

## 2013-07-27 MED ORDER — LIDOCAINE-EPINEPHRINE 1 %-1:100000 IJ SOLN
INTRAMUSCULAR | Status: AC
  Filled 2013-07-27: qty 1

## 2013-07-27 MED ORDER — PROPOFOL 10 MG/ML IV EMUL
5.0000 ug/kg/min | Freq: Once | INTRAVENOUS | Status: AC
  Administered 2013-07-28: 20 ug/kg/min via INTRAVENOUS
  Filled 2013-07-27: qty 100

## 2013-07-27 MED ORDER — OXYMETAZOLINE HCL 0.05 % NA SOLN
NASAL | Status: AC
  Filled 2013-07-27: qty 15

## 2013-07-27 MED ORDER — MIDAZOLAM HCL 2 MG/2ML IJ SOLN: 1.0000 mg | Freq: Once | INTRAMUSCULAR | Status: DC

## 2013-07-27 SURGICAL SUPPLY — 55 items
BLADE SURG 11 STRL SS (BLADE) IMPLANT
BLADE SURG 15 STRL LF DISP TIS (BLADE) ×1 IMPLANT
BLADE SURG 15 STRL SS (BLADE)
BLADE SURG ROTATE 9660 (MISCELLANEOUS) IMPLANT
CANISTER SUCTION 2500CC (MISCELLANEOUS) ×1 IMPLANT
CLEANER TIP ELECTROSURG 2X2 (MISCELLANEOUS) ×1 IMPLANT
CLOSURE WOUND 1/2 X4 (GAUZE/BANDAGES/DRESSINGS)
CONT SPEC 4OZ CLIKSEAL STRL BL (MISCELLANEOUS) ×1 IMPLANT
COVER SURGICAL LIGHT HANDLE (MISCELLANEOUS) ×1 IMPLANT
COVER TABLE BACK 60X90 (DRAPES) ×1 IMPLANT
CRADLE DONUT ADULT HEAD (MISCELLANEOUS) IMPLANT
DECANTER SPIKE VIAL GLASS SM (MISCELLANEOUS) ×1 IMPLANT
DRAPE PROXIMA HALF (DRAPES) ×1 IMPLANT
ELECT COATED BLADE 2.86 ST (ELECTRODE) ×1 IMPLANT
ELECT REM PT RETURN 9FT ADLT (ELECTROSURGICAL)
ELECTRODE REM PT RTRN 9FT ADLT (ELECTROSURGICAL) ×1 IMPLANT
GAUZE SPONGE 4X4 16PLY XRAY LF (GAUZE/BANDAGES/DRESSINGS) ×1 IMPLANT
GLOVE BIO SURGEON STRL SZ7.5 (GLOVE) IMPLANT
GLOVE ECLIPSE 7.5 STRL STRAW (GLOVE) ×2 IMPLANT
GLOVE SS BIOGEL STRL SZ 7.5 (GLOVE) ×1 IMPLANT
GLOVE SUPERSENSE BIOGEL SZ 7.5 (GLOVE)
GOWN STRL REUS W/ TWL LRG LVL3 (GOWN DISPOSABLE) IMPLANT
GOWN STRL REUS W/TWL LRG LVL3 (GOWN DISPOSABLE)
GUARD TEETH (MISCELLANEOUS) IMPLANT
KIT BASIN OR (CUSTOM PROCEDURE TRAY) ×1 IMPLANT
KIT ROOM TURNOVER OR (KITS) IMPLANT
MARKER SKIN DUAL TIP RULER LAB (MISCELLANEOUS) IMPLANT
NDL HYPO 25GX1X1/2 BEV (NEEDLE) ×1 IMPLANT
NEEDLE HYPO 25GX1X1/2 BEV (NEEDLE) IMPLANT
NS IRRIG 1000ML POUR BTL (IV SOLUTION) ×1 IMPLANT
PACK SURGICAL SETUP 50X90 (CUSTOM PROCEDURE TRAY) IMPLANT
PAD ARMBOARD 7.5X6 YLW CONV (MISCELLANEOUS) ×2 IMPLANT
PATTIES SURGICAL .5 X3 (DISPOSABLE) IMPLANT
PENCIL FOOT CONTROL (ELECTRODE) ×1 IMPLANT
SOL PREP POV-IOD 4OZ 10% (MISCELLANEOUS) IMPLANT
SOLUTION ANTI FOG 6CC (MISCELLANEOUS) IMPLANT
SPECIMEN JAR SMALL (MISCELLANEOUS) ×1 IMPLANT
SPONGE DRAIN TRACH 4X4 STRL 2S (GAUZE/BANDAGES/DRESSINGS) IMPLANT
SPONGE GAUZE 4X4 12PLY (GAUZE/BANDAGES/DRESSINGS) ×1 IMPLANT
SPONGE INTESTINAL PEANUT (DISPOSABLE) IMPLANT
SPONGE TONSIL 1 RF SGL (DISPOSABLE) ×1 IMPLANT
STRIP CLOSURE SKIN 1/2X4 (GAUZE/BANDAGES/DRESSINGS) IMPLANT
SURGILUBE 2OZ TUBE FLIPTOP (MISCELLANEOUS) ×1 IMPLANT
SUT CHROMIC GUT 2 0 PS 2 27 (SUTURE) ×1 IMPLANT
SUT ETHILON 3 0 PS 1 (SUTURE) ×1 IMPLANT
SUT SILK 3 0 SH 30 (SUTURE) ×1 IMPLANT
SUT SILK 3 0 TIES 17X18 (SUTURE)
SUT SILK 3-0 18XBRD TIE BLK (SUTURE) IMPLANT
SYR CONTROL 10ML LL (SYRINGE) IMPLANT
SYRINGE 10CC LL (SYRINGE) ×1 IMPLANT
TOWEL OR 17X24 6PK STRL BLUE (TOWEL DISPOSABLE) ×2 IMPLANT
TOWEL OR 17X26 10 PK STRL BLUE (TOWEL DISPOSABLE) ×1 IMPLANT
TUBE CONNECTING 12'X1/4 (SUCTIONS)
TUBE CONNECTING 12X1/4 (SUCTIONS) ×1 IMPLANT
WATER STERILE IRR 1000ML POUR (IV SOLUTION) ×1 IMPLANT

## 2013-07-27 NOTE — Consult Note (Signed)
Reason for Consult: Bleeding from the mouth and respiratory failure Referring Physician: Cardiology  [name] is an 53 y.o. male.  HPI: 53 year old who has multiple medical issues including a recent stroke. He needs to have anti-coagulation but he has also some respiratory failure secondary to the stroke. His intubation was performed last week and he was extubated but then had to be emergently it re\re intubated Sunday. This was apparently traumatic. He has had bleeding from his mouth ever since the of and. He had his heparin turned off and it was felt this might stop the bleeding but it has persisted. He has not required transfusion as of yet but his hemoglobin is 9. He apparently lost about 300 cc through the mouth last night. He also is in need of a tracheotomy according to medical care medicine and they were going to do a percutaneous tracheotomy tomorrow.  Past Medical History  Diagnosis Date  . CHF (congestive heart failure)     EF- 10-15  . Medically noncompliant   . Mitral regurgitation   . Tobacco user   . HTN (hypertension)   . AICD (automatic cardioverter/defibrillator) present   . GERD (gastroesophageal reflux disease)   . Substance abuse   . Chronic renal insufficiency   . Syncope   . Thrombus 08/06/2010  . SYSTOLIC HEART FAILURE, CHRONIC 09/22/2008    Qualifier: Diagnosis of  By: Haroldine Laws, MD, Eileen Stanford   . LV (left ventricular) mural thrombus 01/28/2011  . ICD - IN SITU 09/16/2008    Qualifier: Diagnosis of  By: Peri Maris    . MITRAL STENOSIS/ INSUFFICIENCY, NON-RHEUMATIC 09/22/2008    Qualifier: Diagnosis of  By: Haroldine Laws, MD, Eileen Stanford Hepatomegaly 09/16/2008    Qualifier: Diagnosis of  By: Peri Maris    . High cholesterol 02/26/2012    "at one time"  . Sleep apnea   . Exertional dyspnea 02/26/2012  . History of blood transfusion 08/2011    "when I had heart pump"  . Migraines   . COMMON MIGRAINE 06/14/2009    Qualifier: Diagnosis  of  By: Jenny Reichmann MD, Hunt Oris   . History of gout 02/26/2012  . Depression   . Bipolar affective disorder 10/22/2011    pt denies this hx 02/26/2012    Past Surgical History  Procedure Laterality Date  . Cardiac defibrillator placement  ~ 2008  . Left ventricular assist device  08/2011  . Radiology with anesthesia N/A 07/19/2013    Procedure: RADIOLOGY WITH ANESTHESIA;  Surgeon: Rob Hickman, MD;  Location: [facility];  Service: Radiology;  Laterality: N/A;    Family History  Problem Relation Age of Onset  . Coronary artery disease Neg Hx     Social History:  reports that he quit smoking about 2 years ago. His smoking use included Cigarettes. He has a 8.25 pack-year smoking history. He quit smokeless tobacco use about 2 years ago. He reports that he does not drink alcohol or use illicit drugs.  Allergies:  Allergies  Allergen Reactions  . Ace Inhibitors Cough  . Lexapro [Escitalopram Oxalate] Other (See Comments)    somnolence    Medications: I have reviewed the patient's current medications.  Results for orders placed during the hospital encounter of 07/17/13 (from the past 48 hour(s))  GLUCOSE, CAPILLARY     Status: Abnormal   Collection Time    07/25/13 12:13 PM      Result Value Range   Glucose-Capillary 109 (*) 70 -  99 mg/dL   Comment 1 Notify RN    GLUCOSE, CAPILLARY     Status: None   Collection Time    07/25/13  4:11 PM      Result Value Range   Glucose-Capillary 92  70 - 99 mg/dL  GLUCOSE, CAPILLARY     Status: None   Collection Time    07/25/13  4:28 PM      Result Value Range   Glucose-Capillary 99  70 - 99 mg/dL  POCT I-STAT 3, BLOOD GAS (G3+)     Status: Abnormal   Collection Time    07/25/13  8:07 PM      Result Value Range   pH, Arterial 7.575 (*) 7.350 - 7.450   pCO2 arterial 33.9 (*) 35.0 - 45.0 mmHg   pO2, Arterial 384.0 (*) 80.0 - 100.0 mmHg   Bicarbonate 31.4 (*) 20.0 - 24.0 mEq/L   TCO2 32  0 - 100 mmol/L   O2 Saturation 100.0     Acid-Base Excess  9.0 (*) 0.0 - 2.0 mmol/L   Patient temperature 98.1 F     Collection site ARTERIAL LINE     Drawn by RT     Sample type ARTERIAL    GLUCOSE, CAPILLARY     Status: Abnormal   Collection Time    07/25/13  9:09 PM      Result Value Range   Glucose-Capillary 103 (*) 70 - 99 mg/dL  CBC     Status: Abnormal   Collection Time    07/26/13 12:30 AM      Result Value Range   WBC 10.5  4.0 - 10.5 K/uL   RBC 3.90 (*) 4.22 - 5.81 MIL/uL   Hemoglobin 11.6 (*) 13.0 - 17.0 g/dL   HCT 34.4 (*) 39.0 - 52.0 %   MCV 88.2  78.0 - 100.0 fL   MCH 29.7  26.0 - 34.0 pg   MCHC 33.7  30.0 - 36.0 g/dL   RDW 14.8  11.5 - 15.5 %   Platelets 316  150 - 400 K/uL  LACTATE DEHYDROGENASE     Status: Abnormal   Collection Time    07/26/13 12:30 AM      Result Value Range   LDH 1124 (*) 94 - 250 U/L  HEPARIN LEVEL (UNFRACTIONATED)     Status: Abnormal   Collection Time    07/26/13 12:30 AM      Result Value Range   Heparin Unfractionated <0.10 (*) 0.30 - 0.70 IU/mL   Comment:            IF HEPARIN RESULTS ARE BELOW     EXPECTED VALUES, AND PATIENT     DOSAGE HAS BEEN CONFIRMED,     SUGGEST FOLLOW UP TESTING     OF ANTITHROMBIN III LEVELS.  PROTIME-INR     Status: Abnormal   Collection Time    07/26/13 12:30 AM      Result Value Range   Prothrombin Time 18.0 (*) 11.6 - 15.2 seconds   INR 1.53 (*) 0.00 - 7.65  BASIC METABOLIC PANEL     Status: Abnormal   Collection Time    07/26/13 12:30 AM      Result Value Range   Sodium 136 (*) 137 - 147 mEq/L   Potassium 3.5 (*) 3.7 - 5.3 mEq/L   Chloride 96  96 - 112 mEq/L   CO2 26  19 - 32 mEq/L   Glucose, Bld 120 (*) 70 - 99 mg/dL  BUN 17  6 - 23 mg/dL   Creatinine, Ser 1.15  0.50 - 1.35 mg/dL   Calcium 9.0  8.4 - 10.5 mg/dL   GFR calc non Af Amer 73 (*) >90 mL/min   GFR calc Af Amer 84 (*) >90 mL/min   Comment: (NOTE)     The eGFR has been calculated using the CKD EPI equation.     This calculation has not been validated in all clinical situations.      eGFR's persistently <90 mL/min signify possible Chronic Kidney     Disease.  MAGNESIUM     Status: None   Collection Time    07/26/13 12:30 AM      Result Value Range   Magnesium 2.0  1.5 - 2.5 mg/dL  PHOSPHORUS     Status: None   Collection Time    07/26/13 12:30 AM      Result Value Range   Phosphorus 3.1  2.3 - 4.6 mg/dL  GLUCOSE, CAPILLARY     Status: Abnormal   Collection Time    07/26/13  1:01 AM      Result Value Range   Glucose-Capillary 102 (*) 70 - 99 mg/dL  CARBOXYHEMOGLOBIN     Status: Abnormal   Collection Time    07/26/13  5:10 AM      Result Value Range   Total hemoglobin 9.4 (*) 13.5 - 18.0 g/dL   O2 Saturation 58.8     Carboxyhemoglobin 1.7 (*) 0.5 - 1.5 %   Methemoglobin 1.5  0.0 - 1.5 %  GLUCOSE, CAPILLARY     Status: Abnormal   Collection Time    07/26/13  5:23 AM      Result Value Range   Glucose-Capillary 101 (*) 70 - 99 mg/dL  GLUCOSE, CAPILLARY     Status: Abnormal   Collection Time    07/26/13  8:02 AM      Result Value Range   Glucose-Capillary 121 (*) 70 - 99 mg/dL   Comment 1 Notify RN     Comment 2 Documented in Chart    CBC     Status: Abnormal   Collection Time    07/26/13 11:00 AM      Result Value Range   WBC 5.5  4.0 - 10.5 K/uL   RBC 3.40 (*) 4.22 - 5.81 MIL/uL   Hemoglobin 10.1 (*) 13.0 - 17.0 g/dL   HCT 30.1 (*) 39.0 - 52.0 %   MCV 88.5  78.0 - 100.0 fL   MCH 29.7  26.0 - 34.0 pg   MCHC 33.6  30.0 - 36.0 g/dL   RDW 14.7  11.5 - 15.5 %   Platelets 246  150 - 400 K/uL  GLUCOSE, CAPILLARY     Status: None   Collection Time    07/26/13 11:49 AM      Result Value Range   Glucose-Capillary 98  70 - 99 mg/dL   Comment 1 Documented in Chart     Comment 2 Notify RN    GLUCOSE, CAPILLARY     Status: None   Collection Time    07/26/13  4:47 PM      Result Value Range   Glucose-Capillary 92  70 - 99 mg/dL   Comment 1 Documented in Chart     Comment 2 Notify RN    GLUCOSE, CAPILLARY     Status: Abnormal   Collection Time     07/26/13  8:26 PM      Result Value Range  Glucose-Capillary 116 (*) 70 - 99 mg/dL   Comment 1 Documented in Chart     Comment 2 Notify RN    CBC     Status: Abnormal   Collection Time    07/26/13 10:00 PM      Result Value Range   WBC 6.4  4.0 - 10.5 K/uL   RBC 3.46 (*) 4.22 - 5.81 MIL/uL   Hemoglobin 10.2 (*) 13.0 - 17.0 g/dL   HCT 30.2 (*) 39.0 - 52.0 %   MCV 87.3  78.0 - 100.0 fL   MCH 29.5  26.0 - 34.0 pg   MCHC 33.8  30.0 - 36.0 g/dL   RDW 14.5  11.5 - 15.5 %   Platelets 281  150 - 400 K/uL  GLUCOSE, CAPILLARY     Status: Abnormal   Collection Time    07/26/13 11:53 PM      Result Value Range   Glucose-Capillary 111 (*) 70 - 99 mg/dL   Comment 1 Documented in Chart     Comment 2 Notify RN    GLUCOSE, CAPILLARY     Status: Abnormal   Collection Time    07/27/13  4:07 AM      Result Value Range   Glucose-Capillary 145 (*) 70 - 99 mg/dL   Comment 1 Documented in Chart     Comment 2 Notify RN    CARBOXYHEMOGLOBIN     Status: Abnormal   Collection Time    07/27/13  4:30 AM      Result Value Range   Total hemoglobin 10.1 (*) 13.5 - 18.0 g/dL   O2 Saturation 61.9     Carboxyhemoglobin 1.7 (*) 0.5 - 1.5 %   Methemoglobin 1.6 (*) 0.0 - 1.5 %  CBC     Status: Abnormal   Collection Time    07/27/13  4:40 AM      Result Value Range   WBC 6.9  4.0 - 10.5 K/uL   RBC 3.30 (*) 4.22 - 5.81 MIL/uL   Hemoglobin 9.8 (*) 13.0 - 17.0 g/dL   HCT 29.1 (*) 39.0 - 52.0 %   MCV 88.2  78.0 - 100.0 fL   MCH 29.7  26.0 - 34.0 pg   MCHC 33.7  30.0 - 36.0 g/dL   RDW 14.7  11.5 - 15.5 %   Platelets 305  150 - 400 K/uL  LACTATE DEHYDROGENASE     Status: Abnormal   Collection Time    07/27/13  4:40 AM      Result Value Range   LDH 934 (*) 94 - 250 U/L  PROTIME-INR     Status: Abnormal   Collection Time    07/27/13  4:40 AM      Result Value Range   Prothrombin Time 22.8 (*) 11.6 - 15.2 seconds   INR 2.09 (*) 0.00 - 1.02  BASIC METABOLIC PANEL     Status: Abnormal   Collection  Time    07/27/13  4:40 AM      Result Value Range   Sodium 139  137 - 147 mEq/L   Potassium 3.4 (*) 3.7 - 5.3 mEq/L   Chloride 103  96 - 112 mEq/L   CO2 25  19 - 32 mEq/L   Glucose, Bld 177 (*) 70 - 99 mg/dL   BUN 19  6 - 23 mg/dL   Creatinine, Ser 0.94  0.50 - 1.35 mg/dL   Calcium 7.8 (*) 8.4 - 10.5 mg/dL   GFR calc non Af  Amer >90  >90 mL/min   GFR calc Af Amer >90  >90 mL/min   Comment: (NOTE)     The eGFR has been calculated using the CKD EPI equation.     This calculation has not been validated in all clinical situations.     eGFR's persistently <90 mL/min signify possible Chronic Kidney     Disease.  GLUCOSE, CAPILLARY     Status: Abnormal   Collection Time    07/27/13  7:57 AM      Result Value Range   Glucose-Capillary 128 (*) 70 - 99 mg/dL    Dg Chest Port 1 View  07/27/2013   CLINICAL DATA:  Congestive heart failure  EXAM: PORTABLE CHEST - 1 VIEW  COMPARISON:  07/26/2013  FINDINGS: Tip of an endotracheal tube projects approximately 3.5 cm above the carina, unchanged. Right PICC is unchanged with tip overlying the lower SVC. Left-sided single lead ICD remains in place. Enteric tube courses into the left upper abdomen. Left ventricular assist device is partially visualized. Cardiac silhouette remains mildly enlarged. Small left pleural effusion does not appear significantly changed. There is mild bibasilar atelectasis, slightly increased in the right base. There is no evidence of overt pulmonary edema. No right-sided pleural effusion is identified. No pneumothorax is seen.  IMPRESSION: Unchanged, small left pleural effusion. Bibasilar atelectasis, minimally increased on the right.   Electronically Signed   By: Logan Bores   On: 07/27/2013 07:57   Dg Chest Port 1 View  07/26/2013   CLINICAL DATA:  Respiratory failure.  EXAM: PORTABLE CHEST - 1 VIEW  COMPARISON:  07/25/2013 .  FINDINGS: Endotracheal tube, right PICC line, NG tube in good anatomic position. Prior median sternotomy.  Cardiomegaly, no pulmonary venous congestion. Cardiac pacer noted with stable lead tip positions. Left ventricular assist device noted in stable position. Mild bibasilar atelectasis and/or infiltrates noted. Small left pleural effusion. No pneumothorax.  IMPRESSION: 1. Stable line and tube positions. 2. Stable cardiomegaly. Prior CABG. Cardiac pacer in stable position. Left ventricular assist device in stable position. 3. Mild bibasilar atelectasis versus infiltrates. Small left pleural effusion appear   Electronically Signed   By: Marcello Moores  Register   On: 07/26/2013 07:22   Dg Chest Port 1 View  07/25/2013   CLINICAL DATA:  Endotracheal tube placement  EXAM: PORTABLE CHEST - 1 VIEW  COMPARISON:  Film performed earlier today  FINDINGS: Endotracheal tube is seen with tip 2.5 cm above the carina. Other lines and tubes are unchanged. Mild cardiac enlargement stable. Vascular pattern normal. Mild bibasilar atelectasis.  IMPRESSION: No significant change other than endotracheal tube placement.   Electronically Signed   By: Skipper Cliche M.D.   On: 07/25/2013 21:02   Dg Chest Port 1 View  07/25/2013   CLINICAL DATA:  Respiratory distress. Chronic heart failure with left ventricular assist device.  EXAM: PORTABLE CHEST - 1 VIEW  COMPARISON:  DG CHEST 1V PORT dated 07/24/2013; DG CHEST 1V PORT dated 07/23/2013; DG CHEST 1V PORT dated 07/22/2013; DG CHEST 1V PORT dated 07/21/2013; DG CHEST 1V PORT dated 07/20/2013; DG CHEST 1V PORT dated 07/18/2013  FINDINGS: Feeding tube courses below the diaphragm though its tip is not included on the image. Right arm PICC tip overlies the lower SVC near the cavoatrial junction. Left subclavian pacing defibrillator unchanged and appears intact. Prior sternotomy. Cardiac silhouette moderately enlarged but stable. Mild pulmonary venous hypertension without overt edema. Suboptimal inspiration with mild atelectasis in the lung bases, unchanged since yesterday. No new  pulmonary parenchymal  abnormalities. Left ventricular assist device.  IMPRESSION: 1. Suboptimal inspiration accounts for mild bibasilar atelectasis, stable. 2. Stable cardiomegaly. Pulmonary venous hypertension without overt edema. 3. Support apparatus satisfactory. 4. No new abnormalities.   Electronically Signed   By: Evangeline Dakin M.D.   On: 07/25/2013 12:54   Dg Abd Portable 1v  07/25/2013   CLINICAL DATA:  Feeding tube placement, patient pulled, recheck position  EXAM: PORTABLE ABDOMEN - 1 VIEW  COMPARISON:  07/25/2013  FINDINGS: Tip of feeding tube projects at the expected position of the junction of the second and third portions of the duodenum.  Left ventricular assist device noted.  Bowel gas pattern normal.  IMPRESSION: Tip of feeding tube projects over the expected position of the junction of the second and third portions of the duodenum.  This has been partially retracted since the previous exam.   Electronically Signed   By: Lavonia Dana M.D.   On: 07/25/2013 17:15   Dg Intro Long Gi Tube  07/25/2013   CLINICAL DATA:  Patient with left ventricular assist device due to chronic heart failure, non ST elevation MI during this admission, in need of enteric feeding tube which could not be placed at bedside due to inability to manage excessive secretions.  EXAM: INTRO LONG GI TUBE (TRANSPYLORIC FEEDING TUBE PLACEMENT)  TECHNIQUE: Viscous lidocaine was used to anesthetize the right naris. A 7 [place] Kangaroo feeding tube was advanced via the right naris through the esophagus and into the stomach with intermittent fluoroscopic guidance. The guide wire that is part of the catheter set was used to advanced the tube such that its tip is at the ligament of Treitz. This was confirmed with an injection of contrast. The catheter was secured to the patient's nose by the nurse. No apparent immediate complications.  CONTRAST:  31m Omnipaque Iohexol 300 MG/ML via the feeding tube.  COMPARISON:  None.  FLUOROSCOPY TIME:  3 min 8 sec   FINDINGS: Contrast injection confirmed that the tip of the feeding tube is at the ligament of Treitz, with the side holes in the 3rd and 4th portion of the duodenum.  IMPRESSION: Uncomplicated placement of a 7 [place] feeding tube with its tip at the ligament of Treitz. The tube is ready for immediate use.   Electronically Signed   By: TEvangeline DakinM.D.   On: 07/25/2013 11:51    ROS Blood pressure 114/90, pulse 82, temperature 99.1 F (37.3 C), temperature source Oral, resp. rate 17, height _0  (1.626 m), weight 83.1 kg (183 lb 3.2 oz), SpO2 100.00%. Physical Exam  HENT:  He is intubated and semi-sedated. He does arouse and open his eyes. He has some blood on a towel but no active bleeding coming from the oral cavity. Examination after some sedation delivered shows there is large clots within the oral cavity removed with suction and bayonet forceps. Examination of his palate does not reveal any lacerations or evidence of injury. There is still a slight amount of clot on the left lateral pharynx but I don't see any active bleeding. His teeth do not appear to be injured or bleeding. The tongue does not have any obvious lesions. It is difficult to completely examine him with the endotracheal tube and the NG tube in position along with the device to anchor the endotracheal tube.  Neck: Normal range of motion. Neck supple.    Assessment/Plan: Oropharyngeal bleeding/respiratory failure-after discussing the situation with critical care medicine it was felt that the  tracheotomy could be done in the operating room because he does and will need a direct laryngoscopy to try to find the source of his bleeding in his pharynx. Critical care medicine is in agreement that a percutaneous tracheotomy will be for today operative trach to get this all done in 1 anesthetic. I discussed this situation with the family. I discussed the tracheotomy and direct laryngoscopy including risks, benefits, and options.  All their questions were answered and consent was obtained.  Melissa Montane 07/27/2013, 11:02 AM

## 2013-07-27 NOTE — Consult Note (Signed)
  I discussed with cardiology regarding the anti-coagulation situation. They state the he is going to require this and that the tracheotomy is also going to be required secondary to his medical issues and respiratory problems. They understand the risk of anticoagulate with  the trach. The family also has been informed of this situation and there is a risk of increased bleeding with anticoagulants and the surgical procedure. They want to proceed after answering all her questions.

## 2013-07-27 NOTE — Progress Notes (Signed)
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 He is s/p LVAD placement at [facility] March 2013, as bridge to transplant. Admitted for evaluation of new NSTEMI felt possibly to be related to pump thrombosis. Hosp course complicated by episodes of CP and hypotension on 1/24 pm hours. 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 PCCM asked to see post-op for vent weaning.    SIGNIFICANT EVENTS / STUDIES:  1/24: admitted w/ CP and trop rise. Did have sub-therapeutic INR at home so concern was for pump thrombosis.  1/24: evening had CP, rising Trop I, got mso4 and NTG>>>got hypotensive requiring pressors.  1/25 INR supra-therapeutic. Coumadin stopped, heparin started 1/26: CT head: negative for bleed.  1/26:cerebral arteriogram:  acute right MCA embolic CVA. Not amendable to extraction due to distal location.  1/30 Extubated but airway protection is questionable at best 2/01 Reintubated  LINES / TUBES: LVAD OETT 1/26>>>1/30, 2/1 >> Right PICC 1/25>>>   CULTURES: Resp 1/27 >> few GNR >> normal flora  ANTIBIOTICS: 1/27 zosyn (aspiration, GNR) >>   SUBJECTIVE:   Bloody oral secretions.  Tolerates pressure support. Afebrile On dex gtt  VITAL SIGNS: Temp:  [97.4 F (36.3 C)-99.1 F (37.3 C)] 99.1 F (37.3 C) (02/03 0700) Pulse Rate:  [66-82] 82 (02/03 0900) Resp:  [14-26] 17 (02/03 0900) SpO2:  [100 %] 100 % (02/03 0900) Arterial Line BP: (81-130)/(74-99) 81/74 mmHg (02/03 0900) FiO2 (%):  [40 %] 40 % (02/03 0816) Weight:  [83.1 kg (183 lb 3.2 oz)] 83.1 kg (183 lb 3.2 oz) (02/03 0500) HEMODYNAMICS: CVP:  [9 mmHg-19 mmHg] 9 mmHg VENTILATOR  SETTINGS: Vent Mode:  [-] PRVC FiO2 (%):  [40 %] 40 % Set Rate:  [14 bmp] 14 bmp Vt Set:  [480 mL] 480 mL PEEP:  [5 cmH20] 5 cmH20 Pressure Support:  [12 cmH20] 12 cmH20 Plateau Pressure:  [18 cmH20-19 cmH20] 18 cmH20 INTAKE / OUTPUT: Intake/Output     02/02 0701 - 02/03 0700 02/03 0701 - 02/04 0700   I.V. (mL/kg) 1483.4 (17.9) 78 (0.9)   NG/GT 700 110   IV Piggyback 175    Total Intake(mL/kg) 2358.4 (28.4) 188 (2.3)   Urine (mL/kg/hr) 880 (0.4) 115 (0.5)   Emesis/NG output 100 (0.1)    Total Output 980 115   Net +1378.4 +73         PHYSICAL EXAMINATION: General:  No distress Neuro:  Open's eyes with stimulation, RASS -1 on precedex gtt HEENT: ETT in place, bloody secretions with clots from oral cavity Cardiovascular:  LVAD pump hum. No edema  Lungs:  Coarse scattered rhonchi  Abdomen:  Soft, nontender  Musculoskeletal:  Intact  Skin:  Intact   LABS:  CBC  Recent Labs Lab 07/26/13 1100 07/26/13 2200 07/27/13 0440  WBC 5.5 6.4 6.9  HGB 10.1* 10.2* 9.8*  HCT 30.1* 30.2* 29.1*  PLT 246 281 305   Coag's  Recent Labs Lab 07/25/13 0515 07/26/13 0030 07/27/13 0440  INR 1.61* 1.53* 2.09*   BMET  Recent Labs Lab 07/25/13 0515 07/26/13 0030 07/27/13 0440  NA 141 136* 139  K 4.0 3.5* 3.4*  CL 101 96 103  CO2  28 26 25   BUN 13 17 19   CREATININE 1.13 1.15 0.94  GLUCOSE 98 120* 177*   Electrolytes  Recent Labs Lab 07/25/13 0515 07/26/13 0030 07/27/13 0440  CALCIUM 8.9 9.0 7.8*  MG 2.2 2.0  --   PHOS 3.7 3.1  --    Sepsis Markers  Recent Labs Lab 07/20/13 1030 07/21/13 0415 07/22/13 0414  PROCALCITON 0.13 0.19 0.17   ABG  Recent Labs Lab 07/25/13 2007  PHART 7.575*  PCO2ART 33.9*  PO2ART 384.0*   Glucose  Recent Labs Lab 07/26/13 0802 07/26/13 1149 07/26/13 1647 07/26/13 2026 07/26/13 2353 07/27/13 0757  GLUCAP 121* 98 92 116* 111* 128*    Imaging Dg Chest Port 1 View  07/27/2013   CLINICAL DATA:  Congestive heart failure   EXAM: PORTABLE CHEST - 1 VIEW  COMPARISON:  07/26/2013  FINDINGS: Tip of an endotracheal tube projects approximately 3.5 cm above the carina, unchanged. Right PICC is unchanged with tip overlying the lower SVC. Left-sided single lead ICD remains in place. Enteric tube courses into the left upper abdomen. Left ventricular assist device is partially visualized. Cardiac silhouette remains mildly enlarged. Small left pleural effusion does not appear significantly changed. There is mild bibasilar atelectasis, slightly increased in the right base. There is no evidence of overt pulmonary edema. No right-sided pleural effusion is identified. No pneumothorax is seen.  IMPRESSION: Unchanged, small left pleural effusion. Bibasilar atelectasis, minimally increased on the right.   Electronically Signed   By: Sebastian AcheAllen  Grady   On: 07/27/2013 07:57   Dg Chest Port 1 View  07/26/2013   CLINICAL DATA:  Respiratory failure.  EXAM: PORTABLE CHEST - 1 VIEW  COMPARISON:  07/25/2013 .  FINDINGS: Endotracheal tube, right PICC line, NG tube in good anatomic position. Prior median sternotomy. Cardiomegaly, no pulmonary venous congestion. Cardiac pacer noted with stable lead tip positions. Left ventricular assist device noted in stable position. Mild bibasilar atelectasis and/or infiltrates noted. Small left pleural effusion. No pneumothorax.  IMPRESSION: 1. Stable line and tube positions. 2. Stable cardiomegaly. Prior CABG. Cardiac pacer in stable position. Left ventricular assist device in stable position. 3. Mild bibasilar atelectasis versus infiltrates. Small left pleural effusion appear   Electronically Signed   By: Maisie Fushomas  Register   On: 07/26/2013 07:22   Dg Chest Port 1 View  07/25/2013   CLINICAL DATA:  Endotracheal tube placement  EXAM: PORTABLE CHEST - 1 VIEW  COMPARISON:  Film performed earlier today  FINDINGS: Endotracheal tube is seen with tip 2.5 cm above the carina. Other lines and tubes are unchanged. Mild cardiac  enlargement stable. Vascular pattern normal. Mild bibasilar atelectasis.  IMPRESSION: No significant change other than endotracheal tube placement.   Electronically Signed   By: Esperanza Heiraymond  Rubner M.D.   On: 07/25/2013 21:02   Dg Chest Port 1 View  07/25/2013   CLINICAL DATA:  Respiratory distress. Chronic heart failure with left ventricular assist device.  EXAM: PORTABLE CHEST - 1 VIEW  COMPARISON:  DG CHEST 1V PORT dated 07/24/2013; DG CHEST 1V PORT dated 07/23/2013; DG CHEST 1V PORT dated 07/22/2013; DG CHEST 1V PORT dated 07/21/2013; DG CHEST 1V PORT dated 07/20/2013; DG CHEST 1V PORT dated 07/18/2013  FINDINGS: Feeding tube courses below the diaphragm though its tip is not included on the image. Right arm PICC tip overlies the lower SVC near the cavoatrial junction. Left subclavian pacing defibrillator unchanged and appears intact. Prior sternotomy. Cardiac silhouette moderately enlarged but stable. Mild pulmonary venous hypertension without overt  edema. Suboptimal inspiration with mild atelectasis in the lung bases, unchanged since yesterday. No new pulmonary parenchymal abnormalities. Left ventricular assist device.  IMPRESSION: 1. Suboptimal inspiration accounts for mild bibasilar atelectasis, stable. 2. Stable cardiomegaly. Pulmonary venous hypertension without overt edema. 3. Support apparatus satisfactory. 4. No new abnormalities.   Electronically Signed   By: Hulan Saas M.D.   On: 07/25/2013 12:54   Dg Abd Portable 1v  07/25/2013   CLINICAL DATA:  Feeding tube placement, patient pulled, recheck position  EXAM: PORTABLE ABDOMEN - 1 VIEW  COMPARISON:  07/25/2013  FINDINGS: Tip of feeding tube projects at the expected position of the junction of the second and third portions of the duodenum.  Left ventricular assist device noted.  Bowel gas pattern normal.  IMPRESSION: Tip of feeding tube projects over the expected position of the junction of the second and third portions of the duodenum.  This has been  partially retracted since the previous exam.   Electronically Signed   By: Ulyses Southward M.D.   On: 07/25/2013 17:15   Dg Intro Long Gi Tube  07/25/2013   CLINICAL DATA:  Patient with left ventricular assist device due to chronic heart failure, non ST elevation MI during this admission, in need of enteric feeding tube which could not be placed at bedside due to inability to manage excessive secretions.  EXAM: INTRO LONG GI TUBE (TRANSPYLORIC FEEDING TUBE PLACEMENT)  TECHNIQUE: Viscous lidocaine was used to anesthetize the right naris. A 7 [place] Kangaroo feeding tube was advanced via the right naris through the esophagus and into the stomach with intermittent fluoroscopic guidance. The guide wire that is part of the catheter set was used to advanced the tube such that its tip is at the ligament of Treitz. This was confirmed with an injection of contrast. The catheter was secured to the patient's nose by the nurse. No apparent immediate complications.  CONTRAST:  20mL Omnipaque Iohexol 300 MG/ML via the feeding tube.  COMPARISON:  None.  FLUOROSCOPY TIME:  3 min 8 sec  FINDINGS: Contrast injection confirmed that the tip of the feeding tube is at the ligament of Treitz, with the side holes in the 3rd and 4th portion of the duodenum.  IMPRESSION: Uncomplicated placement of a 7 [place] Kangaroo feeding tube with its tip at the ligament of Treitz. The tube is ready for immediate use.   Electronically Signed   By: Hulan Saas M.D.   On: 07/25/2013 11:51    ASSESSMENT / PLAN:  PULMONARY A: Acute respiratory failure 2nd to Rt MCA and difficulty with airway secretions.  Re-intubated 2/01. Bloody oral secretions ?from traumatic intubation 2/01. P:   -pressure support wean as tolerated -Plan for trach - d/w ENT, can also inspect in OR at same time -f/u CXR   CARDIOVASCULAR A:  NSTEMI (felt to be Pump thrombosis) Acute on Chronic systolic HF (EF 10-15%) w/ LVAD placed in march 2013 [facility].  Cardiogenic  shock  P - LVAD per cards, current flow rate ok - Levophed for MAP >70 - Asa, plavix and heparin per cards >> on hold 2/01 in setting of bloody oral secretions  RENAL A:   AKI in setting of hypoperfusion, s/p contrast load for cerebral arteriogram >> improved. P:   -MAP goals >65 -F/u chemistry  -Renal dose meds   GASTROINTESTINAL A:   Nutrition. P:   -PPI for SUP -resume tube feeds as tolerated 2/1  HEMATOLOGIC A:   Chronic anticoagulation (on coumadin for LVAD)  Concern about acute pump related embolic events P:  - Heparin per pharmacy >> on hold 2/1 due to bloody oral secretions  INFECTIOUS A:   ? aspiration pneumonia P:   -F/u cxr, follow. -Continue zosyn, total abx course planned 8 days (2/8).  ENDOCRINE A:   Mild hyperglycemia  P:   -SSI if glucose > 150.  NEUROLOGIC A:   Right MCA embolic CVA. Not amendable to IR  P:   -per neuro -sedation protocol while on vent with precedex, prn fent   D/w Dr. Gala Romney & Jearld Fenton , ENT  Care during the described time interval was provided by me and/or other providers on the critical care team.  I have reviewed this patient's available data, including medical history, events of note, physical examination and test results as part of my evaluation  CC time x  35 m  ALVA,RAKESH V.  [phone]   07/27/2013, 10:02 AM

## 2013-07-27 NOTE — Progress Notes (Signed)
Occupational Therapy Discharge Patient Details Name: [name] MRN: [id number] DOB: 1960/09/22 Today's Date: 07/27/2013 Time:  -     Patient discharged from OT services secondary to medical decline - will need to re-order OT to resume therapy services.  Please see latest therapy progress note for current level of functioning and progress toward goals.    Progress and discharge plan discussed with patient and/or caregiver: Patient unable to participate in discharge planning and no caregivers available  GO     Jeani Hawking, OTR/L [phone]  07/27/2013, 9:31 AM

## 2013-07-27 NOTE — Progress Notes (Signed)
Patient ID: [name], male   DOB: 29-Oct-1960, 53 y.o.   MRN: [id number]   Percutaneous gastric tube request rec'd today Long term care; CVA For trach today  Last dose Plavix 2/1 Need off 5 days to safely proceed  INR 2.0 Need to be 1.5 or lower to safely proceed  We will keep on radar in IR

## 2013-07-27 NOTE — Progress Notes (Signed)
Paged by ENT  Dr. Jearld Fenton. Patient to go to OR today for trach. Discussed with patient and wife. He is stable on levophed 9 mcg. Molly VAD Coordinator will be in OR with team. Pump speed currently 9200. Will follow up.   [name] B NP-C 12:11 PM

## 2013-07-27 NOTE — Progress Notes (Signed)
Tube feeding restarted per MD Tyson Alias.  To be turned off at midnight for morning procedure(s).

## 2013-07-27 NOTE — Progress Notes (Signed)
Speech Language Pathology Discharge Patient Details Name: [name] MRN: [id number] DOB: 07/17/1960 Today's Date: 07/27/2013 Time:  -     Patient discharged from SLP services secondary to medical decline - will need to re-order SLP to resume therapy services.      Progress and discharge plan discussed with patient and/or caregiver: Patient unable to participate in discharge planning and no caregivers available [name] L. Samson Frederic, [place] CCC/SLP Pager [phone]      Blenda Mounts Laurice 07/27/2013, 10:17 AM

## 2013-07-27 NOTE — Progress Notes (Signed)
D/w Dr Jearld Fenton Plan for perc trach 2/4 by Dr Tyson Alias Will correct INR with FFP   ALVA,RAKESH V.

## 2013-07-27 NOTE — Progress Notes (Signed)
PT Cancellation Note  Patient Details Name: [name] MRN: [id number] DOB: 1960/12/29   Cancelled Treatment:    Reason Eval/Treat Not Completed: Medical issues which prohibited therapy.  Will sign off at this time and await any further orders as appropriate. 07/27/2013  [place] Bing, PT [phone]  (pager)   Doralyn Kirkes, Eliseo Gum 07/27/2013, 9:29 AM

## 2013-07-27 NOTE — Progress Notes (Signed)
Stroke Team Progress Note  HISTORY [name] is a 53 y.o. male left-handed male who presented with sudden onset left-sided weakness and aphasia. He was admitted 07/17/2013 with chest pain. He has a history of congestive heart failure and has a left ventricular assist device in place. His INR was low at home - his pump got clotted (likely causing MI), and he loaded himself and became supratherapeutic 4.97 in hospital. He was given vitamin K yesterday 07/18/2013, his INR came down to 1.9. He was then  started on heparin. Around 7:40 AM 07/19/2013, the nurse helped him to get dressed and stated that he seemed sleepy, he was moving his left side well. The patient endorsed that his symptoms started after he woke up this morning, but woke up 07/19/2013 in his normal state. He appeared to understand well. Patient was not administerd TPA secondary to being on systemic heparin and INR 1.9  SUBJECTIVE Family at bedside. Pt going for trach today in the OR.  OBJECTIVE Most recent Vital Signs: Filed Vitals:   07/27/13 0700 07/27/13 0800 07/27/13 0900 07/27/13 1000  BP:      Pulse: 72 69 82 82  Temp: 99.1 F (37.3 C)     TempSrc: Oral     Resp: 18 24 17 30   Height:      Weight:      SpO2: 100% 100% 100% 100%   CBG (last 3)   Recent Labs  07/26/13 2353 07/27/13 0407 07/27/13 0757  GLUCAP 111* 145* 128*    IV Fluid Intake:   . sodium chloride Stopped (07/27/13 0541)  . amiodarone (NEXTERONE PREMIX) 360 mg/200 mL dextrose 30 mg/hr (07/27/13 0900)  . dexmedetomidine 0.6 mcg/kg/hr (07/27/13 0900)  . nitroGLYCERIN Stopped (07/17/13 1800)  . norepinephrine (LEVOPHED) Adult infusion 5 mcg/min (07/27/13 0930)    MEDICATIONS  . allopurinol  100 mg Oral Daily  . antiseptic oral rinse  15 mL Mouth Rinse QID  . chlorhexidine  15 mL Mouth Rinse BID  . feeding supplement (PRO-STAT SUGAR FREE 64)  30 mL Per Tube TID  . feeding supplement (VITAL HIGH PROTEIN)  1,000 mL Per Tube Q24H  . insulin aspart   0-15 Units Subcutaneous Q4H  . magnesium oxide  400 mg Oral Daily  . pantoprazole (PROTONIX) IV  40 mg Intravenous Q24H  . piperacillin-tazobactam (ZOSYN)  IV  3.375 g Intravenous Q8H  . sodium chloride  10-40 mL Intracatheter Q12H   PRN:  acetaminophen, fentaNYL, hydrALAZINE, midazolam, nitroGLYCERIN, ondansetron (ZOFRAN) IV  Diet:  NPO  Activity:  Bedrest DVT Prophylaxis:  IV heparin  CLINICALLY SIGNIFICANT STUDIES Basic Metabolic Panel:   Recent Labs Lab 07/25/13 0515 07/26/13 0030 07/27/13 0440  NA 141 136* 139  K 4.0 3.5* 3.4*  CL 101 96 103  CO2 28 26 25   GLUCOSE 98 120* 177*  BUN 13 17 19   CREATININE 1.13 1.15 0.94  CALCIUM 8.9 9.0 7.8*  MG 2.2 2.0  --   PHOS 3.7 3.1  --    Liver Function Tests:  No results found for this basename: AST, ALT, ALKPHOS, BILITOT, PROT, ALBUMIN,  in the last 168 hours CBC:   Recent Labs Lab 07/26/13 2200 07/27/13 0440  WBC 6.4 6.9  HGB 10.2* 9.8*  HCT 30.2* 29.1*  MCV 87.3 88.2  PLT 281 305   Coagulation:   Recent Labs Lab 07/24/13 0317 07/25/13 0515 07/26/13 0030 07/27/13 0440  LABPROT 17.6* 18.7* 18.0* 22.8*  INR 1.49 1.61* 1.53* 2.09*   Cardiac Enzymes:  No results found for this basename: CKTOTAL, CKMB, CKMBINDEX, TROPONINI,  in the last 168 hours Urinalysis: No results found for this basename: COLORURINE, APPERANCEUR, LABSPEC, PHURINE, GLUCOSEU, HGBUR, BILIRUBINUR, KETONESUR, PROTEINUR, UROBILINOGEN, NITRITE, LEUKOCYTESUR,  in the last 168 hours Lipid Panel    Component Value Date/Time   CHOL 166 07/20/2013 0020   TRIG 96 07/20/2013 0020   HDL 63 07/20/2013 0020   CHOLHDL 2.6 07/20/2013 0020   VLDL 19 07/20/2013 0020   LDLCALC 84 07/20/2013 0020   HgbA1C  Lab Results  Component Value Date   HGBA1C 5.7* 07/19/2013    Urine Drug Screen:     Component Value Date/Time   LABOPIA NONE DETECTED 06/21/2008 1300   COCAINSCRNUR NONE DETECTED 06/21/2008 1300   LABBENZ NONE DETECTED 06/21/2008 1300   AMPHETMU NONE  DETECTED 06/21/2008 1300   THCU NONE DETECTED 06/21/2008 1300   LABBARB  Value: NONE DETECTED        DRUG SCREEN FOR MEDICAL PURPOSES ONLY.  IF CONFIRMATION IS NEEDED FOR ANY PURPOSE, NOTIFY LAB WITHIN 5 DAYS. 06/21/2008 1300    Alcohol Level: No results found for this basename: ETH,  in the last 168 hours   CT of the brain   07/20/2013 1. New hypodensities in the left basal ganglia and left thalamus,  concerning for evolving acute infarcts. Smaller foci of hypoattenuation in the right caudate and thalamus may also represent acute ischemia.  2. New paranasal sinus mucosal disease. 07/19/2013    1. No acute intracranial process. 2. Very minimal amount of geographic subcortical encephalomalacia within in the anterior lateral aspect of the left temporal lobe, at the site of the patient's prior hemorrhagic contusion.   Cerebral Angiogram 07/20/2013 Rt CFa approach. Distal small filling defects in the the orbitofrontal br of RT MCA sup division,and tertiary angular division of RT MCA  MRI of the brain    2D Echocardiogram  EF 10%. Diffuse kypokinesis  TEE 07/21/2013 no evidence of clot but with RV failure.   CXR   07/26/2013 1. Stable line and tube positions. 2. Stable cardiomegaly. Prior CABG. Cardiac pacer in stable position. Left ventricular assist devicein stable position.  3. Mild bibasilar atelectasis versus infiltrates. Small left pleural effusion appear  EKG  sinus tachycardia. For complete results please see formal report.   Therapy Recommendations   Physical Exam   Young african [place] male sedated and intubated. . Afebrile. Head is nontraumatic. Neck is supple without bruit.. Cardiac exam no murmur or gallop. Lungs are clear to auscultation. Distal pulses are well felt. LVAD right side abdomen Neurological Exam : Drowsy but awakens  and follows commands well. Pupils 3 mm sluggishly reactive. Doll's eye movements are sluggish.Eyes partially open and will blink to threat and follow gaze.  No facial grimacing to pain. No spontaneous extremity movements.  L sided hemiplegia, following commands on R side.   ASSESSMENT [name] is a 53 y.o. male presenting with left sided weakness.  Imaging supports left basal ganglia and thalamic infarcts larger than right basal ganglia subcortical  infarcts. Infarcts cardioembolic secondary to potential sources of atrial fibrillation, LVAD clot and/or acute MI.  On aspirin 81 mg orally every day, clopidogrel 75 mg orally every day and warfarin prior to admission. Now on aspirin suppositories and intravenous heparin for secondary stroke prevention. Patient with resultant left arm hemiplegia, left leg hemiparesis, dysphagia, VDRF.   Respiratory failure, extubated 07/25/2013 with profuse oral bleeding, reintubated same day to protect airway - IV heparin, aspirin and plavix  on hold. For trach placement today in the OR.   Systolic HF, EF 10%, has LVAD. On transplant list prior to admission. Given new stroke, no longer a transplant candidate.  new onset atrial fibrillation with RVR  NSTEMI - Elevated troponins - will manage medically hypertension Chronic RI Hyperlipidemia, LDL 84, on no statin PTA, now on no statin, at goal LDL < 100  obstructive sleep apnea Hx migraines Bipolar affective disorder TEE performed 07/21/2013. Ejection fraction 15-20%. No obvious clot was noted.  Patient has told family and doctors that he wants to continue to fight. Dr. Gala Romney discussed with Dr. Pearlean Brownie.  Hospital day # 10  TREATMENT/PLAN  Agree with holding antiplatelets and angicoagulant given oral bleeding. Being off puts pt at risk for recurrent stroke. Recommend resumption when medically stable.  For trach placement today  Will likely need PEG  Will continue to follow  Annie Main, MSN, RN, ANVP-BC, ANP-BC, GNP-BC [facility] Pager: [phone] 07/27/2013 11:12 AM  I have personally obtained a history, examined the patient,  evaluated imaging results, and formulated the assessment and plan of care. I agree with the above.  Delia Heady, MD

## 2013-07-27 NOTE — Progress Notes (Signed)
Patient's INR noted to be 2.09. MD Vassie Loll ordered 2 FFPs to be given in preparation for trach procedure in AM.  [name] and Arvilla Meres (HF team-managing LVAD) made aware.  CCM and HF team discussed options.  Decision made by MDs to give 1 unit FFP at present time.  Will proceed with orders and continue to monitor.

## 2013-07-27 NOTE — Progress Notes (Addendum)
Patient ID: [name], male   DOB: Dec 23, 1960, 53 y.o.   MRN: [id number] Advanced Heart Failure Rounding Note  Minton is 53 y/o male with a history of severe CHF due to NICM EF 10-15%, and severe MR s/p HM II LVAD placed at [facility] March 2013. His medical history also consists of CRI, NSVT s/p ICD and LV thrombus.   Admitted 1/24 with NSTEMI felt secondary due embolic phenomenon due to pump thrombosis.  LDH high around 900.  On way to cath lab 1/26 developed acute onset of aphasia and left-sided hemiparesis. CT negative for bleed. Taken emergently to neuro-interventional suite but no amenable lesions. Remains intubated on levophed. Remains on propofol gtt, unable to assess neuro status.  S/P TEE 1/28. No evidence of clot but with RV failure.   Extubated 1/30. Reintubated 2/2 due to inability control secretions.   Continues to have blood coming out of mouth d/t traumatic reintubation. Remains off heparin. Remains on levophed 2-4. If MAPs drop below 77 he gets --- flow and  PI events. Speed turned down yesterday to 9200. Awake follows commands and nods head appropriately to questions.   Hgb 11.6->-> 9.8  MAPs 90-100s LDH:  976>1568>1165>1124 Co-ox: 62% INR 2.1  MAPS 80-100 CVP ~10-14  VAD interrogated personally.  Flow 3.6 , Speed 9200 PI 7.5,  Power 4.8   Events: multiple PI events    Filed Vitals:   07/27/13 0100 07/27/13 0200 07/27/13 0300 07/27/13 0400  BP:      Pulse:   74 70  Temp:    99 F (37.2 C)  TempSrc:    Axillary  Resp: 16 14 15 17   Height:      Weight:      SpO2:   100% 100%    Intake/Output Summary (Last 24 hours) at 07/27/13 0520 Last data filed at 07/27/13 0400  Gross per 24 hour  Intake 2761.12 ml  Output    900 ml  Net 1861.12 ml    LABS: Basic Metabolic Panel:  Recent Labs  26/33/35 0515 07/26/13 0030  NA 141 136*  K 4.0 3.5*  CL 101 96  CO2 28 26  GLUCOSE 98 120*  BUN 13 17  CREATININE 1.13 1.15  CALCIUM 8.9 9.0  MG 2.2 2.0  PHOS 3.7 3.1    Liver Function Tests: No results found for this basename: AST, ALT, ALKPHOS, BILITOT, PROT, ALBUMIN,  in the last 72 hours No results found for this basename: LIPASE, AMYLASE,  in the last 72 hours CBC:  Recent Labs  07/26/13 2200 07/27/13 0440  WBC 6.4 6.9  HGB 10.2* 9.8*  HCT 30.2* 29.1*  MCV 87.3 88.2  PLT 281 305   Cardiac Enzymes: No results found for this basename: CKTOTAL, CKMB, CKMBINDEX, TROPONINI,  in the last 72 hours RADIOLOGY: Dg Chest Portable 1 View  07/17/2013   CLINICAL DATA:  Left-sided chest pain for 3 days, weakness and dizziness.  EXAM: PORTABLE CHEST - 1 VIEW  COMPARISON:  08/03/2012  FINDINGS: Evidence of median sternotomy noted with LVAD in place. Left-sided defibrillator noted. Mild prominence of the cardiac silhouette persists. Tricuspid valvuloplasty reidentified. Trace left pleural fluid or thickening persists. No new pulmonary opacity.  IMPRESSION: No new acute abnormality. Stable appearance of trace left pleural fluid or thickening.   Electronically Signed   By: Christiana Pellant M.D.   On: 07/17/2013 10:12    PHYSICAL EXAM General: Intubated and sedated. Heavy oral secretions, frank blood and clots Neck: JVP to jaw Lungs:  Diffuse rhonchi CV: LVAD pump hum normal.  Trace edema.   Abdomen: Soft, nontender, no hepatosplenomegaly, no distention.  Neurologic: awake interactive. Expressive aphasia. L sided paralysis.  Extremities: No clubbing or cyanosis.   TELEMETRY: NSR 70s   Assessment:   1. Acute embolic CVA 2. NSTEMI - Likely pump thrombosis with coronary embolus.  2. Chronic systolic HF s/p HM II VAD  3. NICM EF 15%  4. Hyperkalemia  5. HTN - MAP 70s currently  6. Acute respiratory failure 7. Shock - suspect mixed picture RV failure/vasodilatory 8. Oral bleeding  Plan/Discussion:    He has had NSTEMI and acute CVA due to pump thrombosis/embolism. Now with expressive aphasia and dense L hemiplegia. Unable to manage secretions. Neuro  recommending PEG/trach +/- Palliative Care eval. They have suggested that speech and ability to control secretions will return in several months but L-sided plegia may not recover.   Continues to bleed from after re-intubation - though slowing down some. Hgb dropping. Heparin off. Will need ENT to see today. Would like to get heparin back on as soon as possible. Follow CBC may need transfusion. Would avoid FFP if at all possible despite INR 2.1.  Struggles with RHF but hemodynamics stabilized on levophed. Will continue as needed. Hopefully we will not have to turn pump speed down any further given pump thrombosis.   Outlook appears grim for him. Will d/w further with him and his mother regarding their wishes. iI attempted to call her last night but got her VM. Will re-attempt today. I had long talk with him this am in front of the nurse and he agrees to proceed with PEG and trach. Will try to schedule for this week.   The patient is critically ill with multiple organ systems failure and requires high complexity decision making for assessment and support, frequent evaluation and titration of therapies, application of advanced monitoring technologies and extensive interpretation of multiple databases.   Critical Care Time devoted to patient care services described in this note is 35 Minutes.  I reviewed the LVAD parameters from today, and compared the results to the patient's prior recorded data. No programming changes were made. The LVAD is functioning within specified parameters. The patient performs LVAD self-test daily. LVAD interrogation was negative for any significant power changes, alarms or PI events/speed drops. LVAD equipment check completed and is in good working order. Back-up equipment present. LVAD education done on emergency procedures and precautions and reviewed exit site care.  Kyrin Garn,MD 5:20 AM

## 2013-07-27 NOTE — Anesthesia Preprocedure Evaluation (Signed)
Anesthesia Evaluation  Patient identified by MRN, date of birth, ID bandGeneral Assessment Comment:Sedated on precedex gtt, and fentanyl boluses. Responds to stimulation, unable to move left side of body.  Reviewed: Allergy & Precautions, H&P , NPO status , Patient's Chart, lab work & pertinent test results, reviewed documented beta blocker date and time   History of Anesthesia Complications Negative for: history of anesthetic complications  Airway      Comment: ETT in situ Dental   Pulmonary shortness of breath, sleep apnea , former smoker,          Cardiovascular hypertension, Pt. on medications and Pt. on home beta blockers + Past MI and +CHF + dysrhythmias Atrial Fibrillation and Ventricular Fibrillation + Cardiac Defibrillator  LVAD (heartmate II) in situ, pump speed 9200, on Levophed at 71mcg/min and Amiodarone for Afib   Neuro/Psych  Headaches, PSYCHIATRIC DISORDERS Depression    GI/Hepatic GERD-  ,  Endo/Other    Renal/GU Renal InsufficiencyRenal disease     Musculoskeletal   Abdominal   Peds  Hematology   Anesthesia Other Findings   Reproductive/Obstetrics                           Anesthesia Physical Anesthesia Plan  ASA: IV  Anesthesia Plan: General   Post-op Pain Management:    Induction: Intravenous and Inhalational  Airway Management Planned: Oral ETT and Tracheostomy  Additional Equipment: Arterial line  Intra-op Plan:   Post-operative Plan: Post-operative intubation/ventilation  Informed Consent: I have reviewed the patients History and Physical, chart, labs and discussed the procedure including the risks, benefits and alternatives for the proposed anesthesia with the patient or authorized representative who has indicated his/her understanding and acceptance.     Plan Discussed with: Anesthesiologist  Anesthesia Plan Comments:         Anesthesia Quick  Evaluation

## 2013-07-28 ENCOUNTER — Inpatient Hospital Stay (HOSPITAL_COMMUNITY): Payer: Medicare Other

## 2013-07-28 ENCOUNTER — Encounter (HOSPITAL_COMMUNITY)
Admission: EM | Disposition: A | Discharge status: Rehabilitation Facility | Payer: Self-pay | Source: Home / Self Care | Attending: Internal Medicine

## 2013-07-28 ENCOUNTER — Encounter (HOSPITAL_COMMUNITY): Payer: Self-pay | Admitting: Internal Medicine

## 2013-07-28 DIAGNOSIS — R079 Chest pain, unspecified: Secondary | ICD-10-CM | POA: Diagnosis not present

## 2013-07-28 DIAGNOSIS — T82897A Other specified complication of cardiac prosthetic devices, implants and grafts, initial encounter: Secondary | ICD-10-CM | POA: Diagnosis not present

## 2013-07-28 LAB — PREPARE FRESH FROZEN PLASMA
UNIT DIVISION: 0
Unit division: 0

## 2013-07-28 LAB — CBC
HCT: 27.1 % — ABNORMAL LOW (ref 39.0–52.0)
HEMOGLOBIN: 9 g/dL — AB (ref 13.0–17.0)
MCH: 29.4 pg (ref 26.0–34.0)
MCHC: 33.2 g/dL (ref 30.0–36.0)
MCV: 88.6 fL (ref 78.0–100.0)
PLATELETS: 303 10*3/uL (ref 150–400)
RBC: 3.06 MIL/uL — ABNORMAL LOW (ref 4.22–5.81)
RDW: 14.9 % (ref 11.5–15.5)
WBC: 7.2 10*3/uL (ref 4.0–10.5)

## 2013-07-28 LAB — BASIC METABOLIC PANEL
BUN: 20 mg/dL (ref 6–23)
CO2: 26 meq/L (ref 19–32)
Calcium: 8.4 mg/dL (ref 8.4–10.5)
Chloride: 104 mEq/L (ref 96–112)
Creatinine, Ser: 0.94 mg/dL (ref 0.50–1.35)
GFR calc Af Amer: >90 mL/min (ref >90)
GFR calc non Af Amer: >90 mL/min (ref >90)
GLUCOSE: 134 mg/dL — AB (ref 70–99)
Potassium: 3.7 mEq/L (ref 3.7–5.3)
SODIUM: 141 meq/L (ref 137–147)

## 2013-07-28 LAB — GLUCOSE, CAPILLARY
GLUCOSE-CAPILLARY: 112 mg/dL — AB (ref 70–99)
GLUCOSE-CAPILLARY: 122 mg/dL — AB (ref 70–99)
Glucose-Capillary: 100 mg/dL — ABNORMAL HIGH (ref 70–99)
Glucose-Capillary: 105 mg/dL — ABNORMAL HIGH (ref 70–99)
Glucose-Capillary: 111 mg/dL — ABNORMAL HIGH (ref 70–99)
Glucose-Capillary: 142 mg/dL — ABNORMAL HIGH (ref 70–99)

## 2013-07-28 LAB — HEPATIC FUNCTION PANEL
ALBUMIN: 2.6 g/dL — AB (ref 3.5–5.2)
ALK PHOS: 63 U/L (ref 39–117)
ALT: 16 U/L (ref 0–53)
AST: 27 U/L (ref 0–37)
BILIRUBIN DIRECT: 0.4 mg/dL — AB (ref 0.0–0.3)
BILIRUBIN TOTAL: 0.9 mg/dL (ref 0.3–1.2)
Indirect Bilirubin: 0.5 mg/dL (ref 0.3–0.9)
Total Protein: 6.8 g/dL (ref 6.0–8.3)

## 2013-07-28 LAB — PROTIME-INR
INR: 1.42 (ref 0.00–1.49)
Prothrombin Time: 17 seconds — ABNORMAL HIGH (ref 11.6–15.2)

## 2013-07-28 LAB — CARBOXYHEMOGLOBIN
CARBOXYHEMOGLOBIN: 1.6 % — AB (ref 0.5–1.5)
METHEMOGLOBIN: 1 % (ref 0.0–1.5)
O2 Saturation: 57.2 %
TOTAL HEMOGLOBIN: 9.2 g/dL — AB (ref 13.5–18.0)

## 2013-07-28 LAB — LACTATE DEHYDROGENASE: LDH: 937 U/L — AB (ref 94–250)

## 2013-07-28 LAB — HEPARIN LEVEL (UNFRACTIONATED): Heparin Unfractionated: 0.38 IU/mL (ref 0.30–0.70)

## 2013-07-28 SURGERY — EGD (ESOPHAGOGASTRODUODENOSCOPY)
Anesthesia: Moderate Sedation

## 2013-07-28 MED ORDER — FENTANYL CITRATE 0.05 MG/ML IJ SOLN: 25.0000 ug | INTRAMUSCULAR | Status: AC | PRN

## 2013-07-28 MED ORDER — HEPARIN (PORCINE) IN NACL 100-0.45 UNIT/ML-% IJ SOLN
1200.0000 [IU]/h | INTRAMUSCULAR | Status: DC
  Filled 2013-07-28: qty 250

## 2013-07-28 MED ORDER — HEPARIN (PORCINE) IN NACL 100-0.45 UNIT/ML-% IJ SOLN
1200.0000 [IU]/h | INTRAMUSCULAR | Status: DC
  Administered 2013-07-28 – 2013-07-29 (×2): 1200 [IU]/h via INTRAVENOUS
  Filled 2013-07-28 (×3): qty 250

## 2013-07-28 NOTE — Progress Notes (Signed)
ANTICOAGULATION CONSULT NOTE - Follow Up Consult  Pharmacy Consult for Heparin Indication: LVAD thrombosis/CVA    Allergies  Allergen Reactions  . Ace Inhibitors Cough  . Lexapro [Escitalopram Oxalate] Other (See Comments)    somnolence    Patient Measurements: Height: 5\' 4"  (162.6 cm) Weight: 186 lb 15.2 oz (84.8 kg) IBW/kg (Calculated) : 59.2 Heparin Dosing Weight: 77kg  Vital Signs: Temp: 98.2 F (36.8 C) (02/04 1946) Temp src: Oral (02/04 1946) BP: 105/84 mmHg (02/04 2009) Pulse Rate: 70 (02/04 2009)  Labs:  Recent Labs  07/26/13 0030  07/26/13 2200 07/27/13 0440 07/27/13 2110 07/28/13 0330 07/28/13 2100  HGB 11.6*  < > 10.2* 9.8*  --  9.0*  --   HCT 34.4*  < > 30.2* 29.1*  --  27.1*  --   PLT 316  < > 281 305  --  303  --   APTT  --   --   --   --  39*  --   --   LABPROT 18.0*  --   --  22.8* 17.9* 17.0*  --   INR 1.53*  --   --  2.09* 1.52* 1.42  --   HEPARINUNFRC <0.10*  --   --   --   --   --  0.38  CREATININE 1.15  --   --  0.94  --  0.94  --   < > = values in this interval not displayed.  Estimated Creatinine Clearance: 92.3 ml/min (by C-G formula based on Cr of 0.94).  Assessment: 52yom with LVAD, suspected pump thrombosis and acute CVA continues on heparin. Heparin level (0.38) is therapeutic after restart - continue current rate and follow-up AM heparin level. - H/H trending down, Plts stable - No significant bleeding reported  Goal of Therapy:  Heparin level 0.3-0.5 units/ml Monitor platelets by anticoagulation protocol: Yes   Plan:  1. Continue heparin drip 1200 units/hr (12 ml/hr) 2. Follow-up AM heparin level and CBC  Cleon Dew [phone] 07/28/2013,10:23 PM

## 2013-07-28 NOTE — Procedures (Signed)
Bronchoscopy Procedure Note Athaniel Vires [id number] 1960-08-01  Procedure: Bronchoscopy Indications: Diagnostic evaluation of the airways & to provide visualisation during percutaneous trach  Procedure Details Consent: Risks of procedure as well as the alternatives and risks of each were explained to the (patient/caregiver).  Consent for procedure obtained. Time Out: Verified patient identification, verified procedure, site/side was marked, verified correct patient position, special equipment/implants available, medications/allergies/relevent history reviewed, required imaging and test results available.  Performed  In preparation for procedure, patient was given 100% FiO2 and bronchoscope lubricated. Sedation: Benzodiazepines, Muscle relaxants and Etomidate  Airway entered and the following bronchi were examined: Bronchi.   Procedures performed: Inspection, suctioning of blood clots & visualisation during puncture, guidewire & dilation for perc trach. Trach position confirmed by noting carina. Minimal bleeding during procedure Bronchoscope removed.  , Patient placed back on 100% FiO2 at conclusion of procedure.    Evaluation Hemodynamic Status: BP stable throughout; O2 sats: stable throughout Patient's Current Condition: stable Specimens:  None Complications: No apparent complications Patient did tolerate procedure well.   ALVA,RAKESH V. 07/28/2013

## 2013-07-28 NOTE — Progress Notes (Signed)
MD Tyson Alias updated at bedside.  States okay to place NGT since PEG placement postponed and okay to start heparin.  Will continue to monitor.

## 2013-07-28 NOTE — Progress Notes (Signed)
ANTICOAGULATION/ANTIBIOTIC CONSULT NOTE - Follow Up Consult  Pharmacy Consult for heparin, Zosyn Indication: LVAD thrombosis/CVA + aspiration pneumonia  Allergies  Allergen Reactions  . Ace Inhibitors Cough  . Lexapro [Escitalopram Oxalate] Other (See Comments)    somnolence    Patient Measurements: Height: 5\' 4"  (162.6 cm) Weight: 186 lb 15.2 oz (84.8 kg) IBW/kg (Calculated) : 59.2 Heparin dosing wt: 77 kg   Vital Signs: Temp: 97.9 F (36.6 C) (02/04 0744) Temp src: Oral (02/04 0744) Pulse Rate: 62 (02/04 1130)  Labs:  Recent Labs  07/26/13 0030  07/26/13 2200 07/27/13 0440 07/27/13 2110 07/28/13 0330  HGB 11.6*  < > 10.2* 9.8*  --  9.0*  HCT 34.4*  < > 30.2* 29.1*  --  27.1*  PLT 316  < > 281 305  --  303  APTT  --   --   --   --  39*  --   LABPROT 18.0*  --   --  22.8* 17.9* 17.0*  INR 1.53*  --   --  2.09* 1.52* 1.42  HEPARINUNFRC <0.10*  --   --   --   --   --   CREATININE 1.15  --   --  0.94  --  0.94  < > = values in this interval not displayed.  Estimated Creatinine Clearance: 92.3 ml/min (by C-G formula based on Cr of 0.94).  . sodium chloride Stopped (07/27/13 0541)  . amiodarone (NEXTERONE PREMIX) 360 mg/200 mL dextrose 30 mg/hr (07/28/13 1000)  . dexmedetomidine 1.2 mcg/kg/hr (07/28/13 1040)  . heparin    . nitroGLYCERIN Stopped (07/17/13 1800)  . norepinephrine (LEVOPHED) Adult infusion 1 mcg/min (07/28/13 1000)    Assessment: 53 yo male with LVAD, suspected pump thombosis, admitted for NSTEMI, then developed acute CVA prior to cath. Coumadin on hold due to NPO with IV heparin bridge. INR 2>1.4 s/p FFP last pm for trach 2/4 and PEG later this week.  Heparin had been on hold d/t bleeding from ET tube but during trach placement noted no bleeding and old clots sucked out.   Restart heparin drip 2/4.  Also on Zosyn for aspiration PNA, cultures negative.  Spoke to CCM will stop after doses 07/29/13.  Goal of Therapy:  Heparin level 0.3-0.5 Monitor  platelets by anticoagulation protocol: Yes  Plan:  1) Continue holding Coumadin. 2) Resume heparin drip 1200 uts/hr  3) HL 6hr after restart heparin and daily 4) Continue Zosyn 3.375g IV q 8 hrs.  x1 more day 3) F/u plans to resume anticoagulation.  Leota Sauers Pharm.D. CPP, BCPS Clinical Pharmacist [phone] 07/28/2013 12:01 PM

## 2013-07-28 NOTE — Progress Notes (Signed)
SLP Cancellation Note  Patient Details Name: [name] MRN: [id number] DOB: 01/21/1961   Cancelled treatment:        Order received for Passy-Muir Speaking Valve.  Trach just placed this am, and bronchoscopy completed after trach placement which revealed bruising on posterior pharyngeal wall.  Well defer Passy Muir Trial until tomorrow.   Maryjo Rochester T 07/28/2013, 9:51 AM

## 2013-07-28 NOTE — Progress Notes (Signed)
Pulmonary/Critical Care Progress Note   Name: [name] MRN: [id number] DOB: 03-13-1961    ADMISSION DATE:  07/17/2013 CONSULTATION DATE:  1/26  REFERRING MD :  Gala Romney PRIMARY SERVICE: Benshimon  CHIEF COMPLAINT:  Vent weaning and hypotension  BRIEF PATIENT DESCRIPTION:  This is a 53 year old male w/ known h/o NICM EF 10-15% w/ severe MR. He is s/p LVAD placement at [facility] March 2013, as bridge to transplant. Admitted for evaluation of new NSTEMI felt possibly to be related to pump thrombosis. Hosp course complicated by episodes of CP and hypotension on 1/24 pm hours. 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 PCCM asked to see post-op for vent weaning.    SIGNIFICANT EVENTS / STUDIES:  1/24: admitted w/ CP and trop rise. Did have sub-therapeutic INR at home so concern was for pump thrombosis.  1/24: evening had CP, rising Trop I, got mso4 and NTG>>>got hypotensive requiring pressors.  1/25 INR supra-therapeutic. Coumadin stopped, heparin started 1/26: CT head: negative for bleed.  1/26:cerebral arteriogram:  acute right MCA embolic CVA. Not amendable to extraction due to distal location.  1/30 Extubated but airway protection is questionable at best 2/01 Reintubated 2/3 ENT eval Jearld Fenton) clots suctioned from mouth - no active bleeding site  LINES / TUBES: LVAD OETT 1/26>>>1/30, 2/1 >> Right PICC 1/25>>>   CULTURES: Resp 1/27 >> few GNR >> normal flora  ANTIBIOTICS: 1/27 zosyn (aspiration, GNR) >>   SUBJECTIVE:   Bloody oral secretions.  Tolerates pressure support. Low grade temp Awake On dex gtt  VITAL SIGNS: Temp:  [97.9 F (36.6 C)-100.8 F (38.2 C)] 97.9 F (36.6 C) (02/04 0744) Pulse Rate:  [64-82] 64 (02/04 0700) Resp:  [14-30] 14 (02/04 0700) SpO2:  [100 %] 100 % (02/04 0700) Arterial Line BP: (81-130)/(74-92) 107/84 mmHg (02/04 0700) FiO2 (%):  [40 %] 40 % (02/04 0320) Weight:  [84.8 kg (186 lb 15.2 oz)] 84.8 kg (186 lb  15.2 oz) (02/04 0438) HEMODYNAMICS: CVP:  [9 mmHg-13 mmHg] 11 mmHg VENTILATOR SETTINGS: Vent Mode:  [-] PRVC FiO2 (%):  [40 %] 40 % Set Rate:  [14 bmp] 14 bmp Vt Set:  [480 mL] 480 mL PEEP:  [5 cmH20] 5 cmH20 Plateau Pressure:  [17 cmH20-19 cmH20] 19 cmH20 INTAKE / OUTPUT: Intake/Output     02/03 0701 - 02/04 0700 02/04 0701 - 02/05 0700   I.V. (mL/kg) 934.4 (11)    NG/GT 390    IV Piggyback 150    Total Intake(mL/kg) 1474.4 (17.4)    Urine (mL/kg/hr) 1320 (0.6)    Emesis/NG output     Total Output 1320     Net +154.4           PHYSICAL EXAMINATION: General:  No distress Neuro:  Open's eyes with stimulation, RASS -1 on precedex gtt HEENT: ETT in place, bloody secretions with clots from oral cavity Cardiovascular:  LVAD pump hum. No edema  Lungs:  Coarse scattered rhonchi  Abdomen:  Soft, nontender  Musculoskeletal:  Intact  Skin:  Intact   LABS:  CBC  Recent Labs Lab 07/26/13 2200 07/27/13 0440 07/28/13 0330  WBC 6.4 6.9 7.2  HGB 10.2* 9.8* 9.0*  HCT 30.2* 29.1* 27.1*  PLT 281 305 303   Coag's  Recent Labs Lab 07/27/13 0440 07/27/13 2110 07/28/13 0330  APTT  --  39*  --   INR 2.09* 1.52* 1.42   BMET  Recent Labs Lab 07/26/13 0030 07/27/13 0440 07/28/13 0330  NA 136* 139 141  K 3.5* 3.4* 3.7  CL 96 103 104  CO2 26 25 26   BUN 17 19 20   CREATININE 1.15 0.94 0.94  GLUCOSE 120* 177* 134*   Electrolytes  Recent Labs Lab 07/25/13 0515 07/26/13 0030 07/27/13 0440 07/28/13 0330  CALCIUM 8.9 9.0 7.8* 8.4  MG 2.2 2.0  --   --   PHOS 3.7 3.1  --   --    Sepsis Markers  Recent Labs Lab 07/22/13 0414  PROCALCITON 0.17   ABG  Recent Labs Lab 07/25/13 2007  PHART 7.575*  PCO2ART 33.9*  PO2ART 384.0*   Glucose  Recent Labs Lab 07/27/13 1139 07/27/13 1542 07/27/13 1931 07/27/13 2322 07/28/13 0353 07/28/13 0738  GLUCAP 117* 113* 118* 146* 112* 122*    Imaging Dg Chest Port 1 View  07/28/2013   CLINICAL DATA:  Evaluate  endotracheal tube.  EXAM: PORTABLE CHEST - 1 VIEW  COMPARISON:  07/27/2013  FINDINGS: Endotracheal tube is 4.4 cm above the carina. Again noted is a left ventricular assist device. PICC line tip in the lower SVC. Patient is rotated which limits evaluation of the left lung base. There are residual or persistent densities at the left lung base. Hazy densities at the right lung base suggest atelectasis. No evidence for a pneumothorax. Heart size is stable and there are median sternotomy wires. Nasogastric tube extends into the abdomen. Stable position of the cardiac ICD.  IMPRESSION: Persistent basilar lung densities with minimal change. Findings could represent a combination of atelectasis and small effusions.  Support apparatuses as described.   Electronically Signed   By: Richarda OverlieAdam  Henn M.D.   On: 07/28/2013 07:49   Dg Chest Port 1 View  07/27/2013   CLINICAL DATA:  Congestive heart failure  EXAM: PORTABLE CHEST - 1 VIEW  COMPARISON:  07/26/2013  FINDINGS: Tip of an endotracheal tube projects approximately 3.5 cm above the carina, unchanged. Right PICC is unchanged with tip overlying the lower SVC. Left-sided single lead ICD remains in place. Enteric tube courses into the left upper abdomen. Left ventricular assist device is partially visualized. Cardiac silhouette remains mildly enlarged. Small left pleural effusion does not appear significantly changed. There is mild bibasilar atelectasis, slightly increased in the right base. There is no evidence of overt pulmonary edema. No right-sided pleural effusion is identified. No pneumothorax is seen.  IMPRESSION: Unchanged, small left pleural effusion. Bibasilar atelectasis, minimally increased on the right.   Electronically Signed   By: Sebastian AcheAllen  Grady   On: 07/27/2013 07:57    ASSESSMENT / PLAN:  PULMONARY A: Acute respiratory failure 2nd to Rt MCA and difficulty with airway secretions.  Re-intubated 2/01. Bloody oral secretions ?from traumatic intubation 2/01 -no  active bleed on ENT exam P:   -Plan for trach today, will rapidly wean to ATC in 24h -f/u CXR   CARDIOVASCULAR A:  NSTEMI (felt to be Pump thrombosis) Acute on Chronic systolic HF (EF 10-15%) [facility]/ [facility]  Cardiogenic shock  P - LVAD per cards, current flow rate ok - Levophed for MAP >70 - Asa, plavix and heparin per cards >> on hold since  2/01 in setting of bloody oral secretions  RENAL A:   AKI in setting of hypoperfusion, s/p contrast load for cerebral arteriogram >> improved. P:   -MAP goals >65 -F/u chemistry  -Renal dose meds   GASTROINTESTINAL A:   Nutrition. P:   -PPI for SUP -resume tube feeds after trach, PEG  plan for 2/6 -hold plavix until then  HEMATOLOGIC A:   Chronic anticoagulation (on coumadin for LVAD) Concern about acute pump related embolic events P:  - Heparin per pharmacy >> on hold 2/1 due to bloody oral secretions, can restart 12h after trach -INR corrected by FFP  INFECTIOUS A:   ? aspiration pneumonia P:   -Continue zosyn, total abx course planned 8 days   ENDOCRINE A:   Mild hyperglycemia  P:   -SSI if glucose > 150.  NEUROLOGIC A:   Right MCA embolic CVA. Not amendable to IR  P:   -per neuro -sedation protocol while on vent with precedex, prn fent   D/w Dr. Gala Romney & Jearld Fenton , ENT  Care during the described time interval was provided by me and/or other providers on the critical care team.  I have reviewed this patient's available data, including medical history, events of note, physical examination and test results as part of my evaluation  CC time x  35 m  Dema Timmons V.  [phone]   07/28/2013, 7:53 AM

## 2013-07-28 NOTE — Progress Notes (Signed)
Changed pt back to pre trach vent settings.

## 2013-07-28 NOTE — Progress Notes (Signed)
Direct laryngoscopy performed after trach placement. Friable uvula & some bruising on post pharyngeal wall noted . No area of active bleeding. Blood clot suctioned out.  OK to start heparin in 4-6h Insert NG panda & resume Tfs in 6h  Rande Roylance V.

## 2013-07-28 NOTE — Progress Notes (Signed)
TF stopped for trach placement in AM.

## 2013-07-28 NOTE — Progress Notes (Signed)
Patient ID: [name], male   DOB: [birth year], 53 y.o.   MRN: [id number] Advanced Heart Failure Rounding Note  [name] is 53 y/o male with a history of severe CHF due to NICM EF 10-15%, and severe MR s/p HM II LVAD placed at [facility] March 2013. His medical history also consists of CRI, NSVT s/p ICD and LV thrombus.   Admitted 1/24 with NSTEMI felt secondary due embolic phenomenon due to pump thrombosis.  LDH high around 900.  On way to cath lab 1/26 developed acute onset of aphasia and left-sided hemiparesis. CT negative for bleed. Taken emergently to neuro-interventional suite but no amenable lesions. Remains intubated on levophed. Remains on propofol gtt, unable to assess neuro status.  S/P TEE 1/28. No evidence of clot but with RV failure.   Extubated 1/30. Reintubated 2/2 due to inability control secretions.   Yesterday ENT consulted for bleeding from mouth. Was going to take to OR for trach and laryngoscope, however INR 2.0. Received 1 FFP yesterday and CCM in the process of placing trach and peg. INR this am 1.4. Still having some epistaxis and bleeding in back of mouth. Stable overnight. Levophed 3 mcg and MAPs 80-90s. Remains off heparin.   Hgb 11.6> 9.8>9.0  MAPs 80s LDH:  976>1568>1165>1124>937 Co-ox: 62%>57% INR 1.4  VAD interrogated personally.  Flow 6.1 , Speed 9200 PI 7.0,  Power 6.0   Events: multiple PI events    Filed Vitals:   07/28/13 0930 07/28/13 0945 07/28/13 1000 07/28/13 1015  Pulse: 58 66 66 65  Temp:      TempSrc:      Resp: 20 20 14 14   Height:      Weight:      SpO2: 100% 100% 100% 100%    Intake/Output Summary (Last 24 hours) at 07/28/13 1113 Last data filed at 07/28/13 1000  Gross per 24 hour  Intake 1291.74 ml  Output   1255 ml  Net  36.74 ml    LABS: Basic Metabolic Panel:  Recent Labs  46/95/07 0030 07/27/13 0440 07/28/13 0330  NA 136* 139 141  K 3.5* 3.4* 3.7  CL 96 103 104  CO2 26 25 26   GLUCOSE 120* 177* 134*  BUN 17 19 20    CREATININE 1.15 0.94 0.94  CALCIUM 9.0 7.8* 8.4  MG 2.0  --   --   PHOS 3.1  --   --    Liver Function Tests:  Recent Labs  07/28/13 0330  AST 27  ALT 16  ALKPHOS 63  BILITOT 0.9  PROT 6.8  ALBUMIN 2.6*   No results found for this basename: LIPASE, AMYLASE,  in the last 72 hours CBC:  Recent Labs  07/27/13 0440 07/28/13 0330  WBC 6.9 7.2  HGB 9.8* 9.0*  HCT 29.1* 27.1*  MCV 88.2 88.6  PLT 305 303   Cardiac Enzymes: No results found for this basename: CKTOTAL, CKMB, CKMBINDEX, TROPONINI,  in the last 72 hours RADIOLOGY: Dg Chest Portable 1 View  07/17/2013   CLINICAL DATA:  Left-sided chest pain for 3 days, weakness and dizziness.  EXAM: PORTABLE CHEST - 1 VIEW  COMPARISON:  08/03/2012  FINDINGS: Evidence of median sternotomy noted with LVAD in place. Left-sided defibrillator noted. Mild prominence of the cardiac silhouette persists. Tricuspid valvuloplasty reidentified. Trace left pleural fluid or thickening persists. No new pulmonary opacity.  IMPRESSION: No new acute abnormality. Stable appearance of trace left pleural fluid or thickening.   Electronically Signed   By: Christiana Pellant  M.D.   On: 07/17/2013 10:12    PHYSICAL EXAM General: Intubated and sedated. Heavy oral secretions Neck: JVP to jaw; trach placed Lungs: Diffuse rhonchi CV: LVAD pump hum normal.  Trace edema.   Abdomen: Soft, nontender, no hepatosplenomegaly, no distention.  Neurologic: awake interactive. Expressive aphasia. L sided paralysis.  Extremities: No clubbing or cyanosis.   TELEMETRY: NSR 70s   Assessment:   1. Acute embolic CVA 2. NSTEMI - Likely pump thrombosis with coronary embolus.  2. Chronic systolic HF s/p HM II VAD  3. NICM EF 15%  4. Hyperkalemia  5. HTN - MAP 70s currently  6. Acute respiratory failure 7. Shock - suspect mixed picture RV failure/vasodilatory 8. Oral bleeding  Plan/Discussion:    He has had NSTEMI and acute CVA due to pump thrombosis/embolism. Now  with expressive aphasia and dense L hemiplegia. Unable to manage secretions and neuro recommended PEG/trach +/- Palliative Care eval. They have suggested that speech and ability to control secretions will return in several months but L-sided plegia may not recover. Lenghty discussion with patient yesterday and he decided he wanted to proceed with PEG/trach.   CCM placed trach this am with minimal bleeding. Still having some epistaxis and mouth bleeding, if continues will call ENT back. Will start heparin back in 2 hrs. PEG on hold due to receiving Plavix. Last dose on 2/1. IR to place later this week  Struggles with RHF but hemodynamics stabilized on levophed. Can wean levophed as tolerated. Will continue as needed. Hopefully we will not have to turn pump speed down any further given pump thrombosis.   He will eventually need placement at [facility]. I d/w Dr. Angelina Okrowley.   The patient is critically ill with multiple organ systems failure and requires high complexity decision making for assessment and support, frequent evaluation and titration of therapies, application of advanced monitoring technologies and extensive interpretation of multiple databases.   Critical Care Time devoted to patient care services described in this note is 45 Minutes.  I reviewed the LVAD parameters from today, and compared the results to the patient's prior recorded data. No programming changes were made. The LVAD is functioning within specified parameters. The patient performs LVAD self-test daily. LVAD interrogation was negative for any significant power changes, alarms or PI events/speed drops. LVAD equipment check completed and is in good working order. Back-up equipment present. LVAD education done on emergency procedures and precautions and reviewed exit site care.  Daniel Bensimhon,MD 11:13 AM

## 2013-07-28 NOTE — Progress Notes (Signed)
After reviewing the patient's CXR and KUB it seems as though his VAD connections are tunneled in the LUQ near where we would normally place the PEG.  Probably would not be a good idea to blindly place a PEG endoscopically.  I  Have spoken with Dr. Gala Romney.  Marta Lamas. Gae Bon, MD, FACS [phone] [facility] Surgery

## 2013-07-28 NOTE — Progress Notes (Signed)
MD Pearlean Brownie at bedside.  Recommends to place PEG tube today.  Recommendations discussed with MD Vassie Loll and heart failure team.  Trauma consulted and will come to assess for PEG placement.  Will hold off on NGT placement, restarting tube feeds and restarting heparin until plans confirmed.  Will continue to monitor.

## 2013-07-28 NOTE — Progress Notes (Addendum)
MD Tyson Alias and MD Vassie Loll at bedside for trach and bronch procedures.  Fentanyl, versed and vec given per MD's during procedures.  Per MD Vassie Loll: place NGT for feeding now and restart tube feeding four hours after procedures end.  Also to restart heparin two hours after procedures end.  Will continue to monitor.

## 2013-07-28 NOTE — Progress Notes (Signed)
Patient ID: [name], male   DOB: [birth year], 53 y.o.   MRN: [id number]   Perc G tube requested CVA; severe CHF; CAD- LVAD Long term care  Still off Plavix- last dose 2/1 INR 1.42 today  Intubated On Levophed  Will discuss with Radiologist Plan for possibly 2/6 or so

## 2013-07-28 NOTE — Progress Notes (Signed)
Stroke Team Progress Note  HISTORY Beren Linnemann is a 53 y.o. male left-handed male who presented with sudden onset left-sided weakness and aphasia. He was admitted 07/17/2013 with chest pain. He has a history of congestive heart failure and has a left ventricular assist device in place. His INR was low at home - his pump got clotted (likely causing MI), and he loaded himself and became supratherapeutic 4.97 in hospital. He was given vitamin K yesterday 07/18/2013, his INR came down to 1.9. He was then  started on heparin. Around 7:40 AM 07/19/2013, the nurse helped him to get dressed and stated that he seemed sleepy, he was moving his left side well. The patient endorsed that his symptoms started after he woke up this morning, but woke up 07/19/2013 in his normal state. He appeared to understand well. Patient was not administerd TPA secondary to being on systemic heparin and INR 1.9  SUBJECTIVE No family at bedside - RN bringing up issue related to trach and PEG placement on different days in the setting of FFP - concerned about coordination of procedures.   OBJECTIVE Most recent Vital Signs: Filed Vitals:   07/28/13 0845 07/28/13 0900 07/28/13 0915 07/28/13 0930  Pulse: 68 68 69 58  Temp:      TempSrc:      Resp:  20 20 20   Height:      Weight:      SpO2: 98% 97% 97% 100%   CBG (last 3)   Recent Labs  07/27/13 2322 07/28/13 0353 07/28/13 0738  GLUCAP 146* 112* 122*    IV Fluid Intake:   . sodium chloride Stopped (07/27/13 0541)  . amiodarone (NEXTERONE PREMIX) 360 mg/200 mL dextrose 30 mg/hr (07/28/13 0600)  . dexmedetomidine 0.8 mcg/kg/hr (07/28/13 0915)  . heparin    . nitroGLYCERIN Stopped (07/17/13 1800)  . norepinephrine (LEVOPHED) Adult infusion 1 mcg/min (07/28/13 0915)    MEDICATIONS  . allopurinol  100 mg Oral Daily  . antiseptic oral rinse  15 mL Mouth Rinse QID  . chlorhexidine  15 mL Mouth Rinse BID  . etomidate  40 mg Intravenous Once  . feeding supplement  (PRO-STAT SUGAR FREE 64)  30 mL Per Tube TID  . feeding supplement (VITAL HIGH PROTEIN)  1,000 mL Per Tube Q24H  . insulin aspart  0-15 Units Subcutaneous Q4H  . magnesium oxide  400 mg Oral Daily  . pantoprazole (PROTONIX) IV  40 mg Intravenous Q24H  . piperacillin-tazobactam (ZOSYN)  IV  3.375 g Intravenous Q8H  . sodium chloride  10-40 mL Intracatheter Q12H   PRN:  acetaminophen, fentaNYL, fentaNYL, hydrALAZINE, midazolam, nitroGLYCERIN, ondansetron (ZOFRAN) IV  Diet:  NPO  Activity:  Bedrest DVT Prophylaxis:  IV heparin  CLINICALLY SIGNIFICANT STUDIES Basic Metabolic Panel:   Recent Labs Lab 07/25/13 0515 07/26/13 0030 07/27/13 0440 07/28/13 0330  NA 141 136* 139 141  K 4.0 3.5* 3.4* 3.7  CL 101 96 103 104  CO2 28 26 25 26   GLUCOSE 98 120* 177* 134*  BUN 13 17 19 20   CREATININE 1.13 1.15 0.94 0.94  CALCIUM 8.9 9.0 7.8* 8.4  MG 2.2 2.0  --   --   PHOS 3.7 3.1  --   --    Liver Function Tests:   Recent Labs Lab 07/28/13 0330  AST 27  ALT 16  ALKPHOS 63  BILITOT 0.9  PROT 6.8  ALBUMIN 2.6*   CBC:   Recent Labs Lab 07/27/13 0440 07/28/13 0330  WBC 6.9 7.2  HGB 9.8* 9.0*  HCT 29.1* 27.1*  MCV 88.2 88.6  PLT 305 303   Coagulation:   Recent Labs Lab 07/26/13 0030 07/27/13 0440 07/27/13 2110 07/28/13 0330  LABPROT 18.0* 22.8* 17.9* 17.0*  INR 1.53* 2.09* 1.52* 1.42   Cardiac Enzymes:  No results found for this basename: CKTOTAL, CKMB, CKMBINDEX, TROPONINI,  in the last 168 hours Urinalysis: No results found for this basename: COLORURINE, APPERANCEUR, LABSPEC, PHURINE, GLUCOSEU, HGBUR, BILIRUBINUR, KETONESUR, PROTEINUR, UROBILINOGEN, NITRITE, LEUKOCYTESUR,  in the last 168 hours Lipid Panel    Component Value Date/Time   CHOL 166 07/20/2013 0020   TRIG 96 07/20/2013 0020   HDL 63 07/20/2013 0020   CHOLHDL 2.6 07/20/2013 0020   VLDL 19 07/20/2013 0020   LDLCALC 84 07/20/2013 0020   HgbA1C  Lab Results  Component Value Date   HGBA1C 5.7*  07/19/2013    Urine Drug Screen:     Component Value Date/Time   LABOPIA NONE DETECTED 06/21/2008 1300   COCAINSCRNUR NONE DETECTED 06/21/2008 1300   LABBENZ NONE DETECTED 06/21/2008 1300   AMPHETMU NONE DETECTED 06/21/2008 1300   THCU NONE DETECTED 06/21/2008 1300   LABBARB  Value: NONE DETECTED        DRUG SCREEN FOR MEDICAL PURPOSES ONLY.  IF CONFIRMATION IS NEEDED FOR ANY PURPOSE, NOTIFY LAB WITHIN 5 DAYS. 06/21/2008 1300    Alcohol Level: No results found for this basename: ETH,  in the last 168 hours   CT of the brain   07/20/2013 1. New hypodensities in the left basal ganglia and left thalamus,  concerning for evolving acute infarcts. Smaller foci of hypoattenuation in the right caudate and thalamus may also represent acute ischemia.  2. New paranasal sinus mucosal disease. 07/19/2013    1. No acute intracranial process. 2. Very minimal amount of geographic subcortical encephalomalacia within in the anterior lateral aspect of the left temporal lobe, at the site of the patient's prior hemorrhagic contusion.   Cerebral Angiogram 07/20/2013 Rt CFa approach. Distal small filling defects in the the orbitofrontal br of RT MCA sup division,and tertiary angular division of RT MCA  MRI of the brain    2D Echocardiogram  EF 10%. Diffuse kypokinesis  TEE 07/21/2013 no evidence of clot but with RV failure.   CXR   07/28/2013 Tracheostomy tube with the tip 3 cm above the carina.   EKG  sinus tachycardia. For complete results please see formal report.   Therapy Recommendations   Physical Exam   Young african [place] male with tracheostomy on ventilator. . Afebrile. Head is nontraumatic. Neck is supple without bruit.. Cardiac exam no murmur or gallop. Lungs are clear to auscultation. Distal pulses are well felt. LVAD right side abdomen Neurological Exam : Drowsy but awakens  and follows commands well. Pupils 3 mm sluggishly reactive. Doll's eye movements are sluggish.Eyes partially open and will  blink to threat and follow gaze. No facial grimacing to pain. No spontaneous extremity movements.  L sided hemiplegia, following commands on R side.   ASSESSMENT Mr. Marris Frontera is a 53 y.o. male presenting with left sided weakness.  Imaging supports left basal ganglia and thalamic infarcts larger than right basal ganglia subcortical  infarcts. Infarcts cardioembolic secondary to potential sources of atrial fibrillation, LVAD clot and/or acute MI.  On aspirin 81 mg orally every day, clopidogrel 75 mg orally every day and warfarin prior to admission. Now on aspirin suppositories and intravenous heparin for secondary stroke prevention. Patient with resultant left arm hemiplegia, left  leg hemiparesis, dysphagia, VDRF.  Respiratory failure, extubated 07/25/2013 with profuse oral bleeding, reintubated same day to protect airway - IV heparin, aspirin and plavix on hold. Trach placement delayed secondary to elevated INR. INR 2.09. FFP given in anticipation of trach placement   Systolic HF, EF 10%, has LVAD. On transplant list prior to admission. Given new stroke, no longer a transplant candidate.  new onset atrial fibrillation with RVR  NSTEMI - Elevated troponins - will manage medically hypertension Chronic RI Hyperlipidemia, LDL 84, on no statin PTA, now on no statin, at goal LDL < 100  obstructive sleep apnea Hx migraines Bipolar affective disorder TEE performed 07/21/2013. Ejection fraction 15-20%. No obvious clot was noted.  Patient has told family and doctors that he wants to continue to fight. Dr. [name] discussed with Dr. Pearlean BrownieSethi.  Hospital day # 11  TREATMENT/PLAN  Agree with holding antiplatelets and angicoagulant given oral bleeding. Being off puts pt at risk for recurrent stroke. Recommend resumption when medically stable.  For trach placement today. We recommend PEG placement at the same time given need to give FFP to reverse for trach - Dr. Pearlean BrownieSethi called and spoke with Dr. Vassie LollAlva.  Will hold heparin for now and he will address with trauma.  Will continue to follow  Annie MainSHARON BIBY, MSN, RN, ANVP-BC, ANP-BC, GNP-BC [facility] Pager: [phone] 07/28/2013 10:03 AM  I have personally obtained a history, examined the patient, evaluated imaging results, and formulated the assessment and plan of care. I agree with the above. Delia HeadyPramod Shavette Shoaff, MD

## 2013-07-28 NOTE — Procedures (Signed)
Perc trach See full dictation Blood loss less 5 cc Tolerated well 8 shiley  Mcarthur Rossetti. Tyson Alias, MD, FACP Pgr: [phone] [facility]

## 2013-07-28 NOTE — Progress Notes (Signed)
Patient needs a PEG because of recent CVA.  Will try to ge done ASAP.  [name]. Gae Bon, MD, FACS [phone] [facility] Surgery

## 2013-07-29 DIAGNOSIS — Z930 Tracheostomy status: Secondary | ICD-10-CM

## 2013-07-29 LAB — GLUCOSE, CAPILLARY
GLUCOSE-CAPILLARY: 135 mg/dL — AB (ref 70–99)
GLUCOSE-CAPILLARY: 99 mg/dL (ref 70–99)
Glucose-Capillary: 167 mg/dL — ABNORMAL HIGH (ref 70–99)
Glucose-Capillary: 111 mg/dL — ABNORMAL HIGH (ref 70–99)
Glucose-Capillary: 116 mg/dL — ABNORMAL HIGH (ref 70–99)
Glucose-Capillary: 105 mg/dL — ABNORMAL HIGH (ref 70–99)

## 2013-07-29 LAB — CARBOXYHEMOGLOBIN
CARBOXYHEMOGLOBIN: 1.9 % — AB (ref 0.5–1.5)
METHEMOGLOBIN: 1.5 % (ref 0.0–1.5)
O2 Saturation: 63.6 %
TOTAL HEMOGLOBIN: 8.1 g/dL — AB (ref 13.5–18.0)

## 2013-07-29 LAB — CBC
HCT: 26 % — ABNORMAL LOW (ref 39.0–52.0)
HEMOGLOBIN: 8.5 g/dL — AB (ref 13.0–17.0)
MCH: 29.3 pg (ref 26.0–34.0)
MCHC: 32.7 g/dL (ref 30.0–36.0)
MCV: 89.7 fL (ref 78.0–100.0)
Platelets: 319 10*3/uL (ref 150–400)
RBC: 2.9 MIL/uL — AB (ref 4.22–5.81)
RDW: 15.1 % (ref 11.5–15.5)
WBC: 6.3 10*3/uL (ref 4.0–10.5)

## 2013-07-29 LAB — PROTIME-INR
INR: 1.69 — ABNORMAL HIGH (ref 0.00–1.49)
INR: 1.4 (ref 0.00–1.49)
Prothrombin Time: 19.4 seconds — ABNORMAL HIGH (ref 11.6–15.2)
Prothrombin Time: 16.8 seconds — ABNORMAL HIGH (ref 11.6–15.2)

## 2013-07-29 LAB — BASIC METABOLIC PANEL
BUN: 17 mg/dL (ref 6–23)
CALCIUM: 8.3 mg/dL — AB (ref 8.4–10.5)
CO2: 23 mEq/L (ref 19–32)
Chloride: 106 mEq/L (ref 96–112)
Creatinine, Ser: 0.88 mg/dL (ref 0.50–1.35)
GFR calc Af Amer: >90 mL/min (ref >90)
Glucose, Bld: 156 mg/dL — ABNORMAL HIGH (ref 70–99)
Potassium: 3.7 mEq/L (ref 3.7–5.3)
SODIUM: 141 meq/L (ref 137–147)

## 2013-07-29 LAB — LACTATE DEHYDROGENASE: LDH: 870 U/L — ABNORMAL HIGH (ref 94–250)

## 2013-07-29 LAB — HEPARIN LEVEL (UNFRACTIONATED): Heparin Unfractionated: 0.45 IU/mL (ref 0.30–0.70)

## 2013-07-29 MED ORDER — LACTULOSE 10 GM/15ML PO SOLN
20.0000 g | Freq: Once | ORAL | Status: AC
  Administered 2013-07-29: 20 g
  Filled 2013-07-29: qty 30

## 2013-07-29 MED ORDER — SODIUM CHLORIDE 0.9 % IV BOLUS (SEPSIS)
250.0000 mL | Freq: Once | INTRAVENOUS | Status: AC
  Administered 2013-07-29: 250 mL via INTRAVENOUS

## 2013-07-29 NOTE — Progress Notes (Signed)
Pulmonary/Critical Care Progress Note   Name: [name] MRN: [id number] DOB: Sep 28, 1960    ADMISSION DATE:  07/17/2013 CONSULTATION DATE:  1/26  REFERRING MD :  Gala Romney PRIMARY SERVICE: Benshimon  CHIEF COMPLAINT:  Vent weaning and hypotension  BRIEF PATIENT DESCRIPTION:  This is a 53 year old male w/ known h/o NICM EF 10-15% w/ severe MR. He is s/p LVAD placement at [facility] March 2013, as bridge to transplant. Admitted for evaluation of new NSTEMI felt possibly to be related to pump thrombosis. Hosp course complicated by episodes of CP and hypotension on 1/24 pm hours. 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 PCCM asked to see post-op for vent weaning.    SIGNIFICANT EVENTS / STUDIES:  1/24: admitted w/ CP and trop rise. Did have sub-therapeutic INR at home so concern was for pump thrombosis.  1/24: evening had CP, rising Trop I, got mso4 and NTG>>>got hypotensive requiring pressors.  1/25 INR supra-therapeutic. Coumadin stopped, heparin started 1/26: CT head: negative for bleed.  1/26:cerebral arteriogram:  acute right MCA embolic CVA. Not amendable to extraction due to distal location.  1/30 Extubated but airway protection is questionable at best 2/01 Reintubated 2/3 ENT eval Jearld Fenton) clots suctioned from mouth - no active bleeding site  LINES / TUBES: LVAD OETT 1/26>>>1/30, 2/1 >>2/4 Trach 2/4 >> Right PICC 1/25>>>   CULTURES: Resp 1/27 >> few GNR >> normal flora  ANTIBIOTICS: 1/27 zosyn (aspiration, GNR) >> 2/5  SUBJECTIVE:   Minimal Bloody oral secretions after starting heparin.  Tolerates pressure support 10/5 afebrile Awake On dex gtt  VITAL SIGNS: Temp:  [97.9 F (36.6 C)-98.7 F (37.1 C)] 98.7 F (37.1 C) (02/05 0336) Pulse Rate:  [58-77] 72 (02/05 0600) Resp:  [14-52] 18 (02/05 0600) BP: (85-115)/(75-89) 115/89 mmHg (02/05 0336) SpO2:  [97 %-100 %] 100 % (02/05 0600) Arterial Line BP: (76-152)/(71-101) 152/101 mmHg  (02/05 0600) FiO2 (%):  [40 %-100 %] 40 % (02/05 0336) Weight:  [89 kg (196 lb 3.4 oz)] 89 kg (196 lb 3.4 oz) (02/05 0400) HEMODYNAMICS: CVP:  [11 mmHg-17 mmHg] 17 mmHg VENTILATOR SETTINGS: Vent Mode:  [-] PRVC FiO2 (%):  [40 %-100 %] 40 % Set Rate:  [14 bmp-20 bmp] 14 bmp Vt Set:  [480 mL] 480 mL PEEP:  [5 cmH20] 5 cmH20 Pressure Support:  [5 cmH20-10 cmH20] 10 cmH20 Plateau Pressure:  [17 cmH20-21 cmH20] 20 cmH20 INTAKE / OUTPUT: Intake/Output     02/04 0701 - 02/05 0700   I.V. (mL/kg) 1185 (13.3)   NG/GT 660   IV Piggyback 125   Total Intake(mL/kg) 1970 (22.1)   Urine (mL/kg/hr) 935 (0.4)   Total Output 935   Net +1035        PHYSICAL EXAMINATION: General:  No distress Neuro:  Open's eyes with stimulation, RASS 0 on precedex gtt HEENT: trach in place, bloody secretions from oral cavity Cardiovascular:  LVAD pump hum. No edema  Lungs:  Coarse scattered rhonchi  Abdomen:  Soft, nontender  Musculoskeletal:  Intact  Skin:  Intact   LABS:  CBC  Recent Labs Lab 07/27/13 0440 07/28/13 0330 07/29/13 0154  WBC 6.9 7.2 6.3  HGB 9.8* 9.0* 8.5*  HCT 29.1* 27.1* 26.0*  PLT 305 303 319   Coag's  Recent Labs Lab 07/27/13 2110 07/28/13 0330 07/29/13 0154  APTT 39*  --   --   INR 1.52* 1.42 1.69*   BMET  Recent Labs Lab 07/27/13 0440 07/28/13 0330 07/29/13 0154  NA 139 141 141  K 3.4* 3.7 3.7  CL 103 104 106  CO2 25 26 23   BUN 19 20 17   CREATININE 0.94 0.94 0.88  GLUCOSE 177* 134* 156*   Electrolytes  Recent Labs Lab 07/25/13 0515 07/26/13 0030 07/27/13 0440 07/28/13 0330 07/29/13 0154  CALCIUM 8.9 9.0 7.8* 8.4 8.3*  MG 2.2 2.0  --   --   --   PHOS 3.7 3.1  --   --   --    Sepsis Markers No results found for this basename: LATICACIDVEN, PROCALCITON, O2SATVEN,  in the last 168 hours ABG  Recent Labs Lab 07/25/13 2007  PHART 7.575*  PCO2ART 33.9*  PO2ART 384.0*   Glucose  Recent Labs Lab 07/28/13 0738 07/28/13 1211 07/28/13 1545  07/28/13 1934 07/28/13 2344 07/29/13 0329  GLUCAP 122* 100* 111* 105* 142* 135*    Imaging Chest Portable 1 View To Assess Tube Placement And Rule-out Pneumothorax  07/28/2013   CLINICAL DATA:  Trach placement  EXAM: PORTABLE CHEST - 1 VIEW  COMPARISON:  DG CHEST 1V PORT dated 07/28/2013  FINDINGS: There is interval placement of tracheostomy to with the tip 3 cm above the carina. There is a right-sided PICC line in satisfactory position. There is a single lead cardiac pacer. There is bibasilar hazy airspace disease which may represent atelectasis versus developing infiltrate. There is no definite pleural effusion or pneumothorax. Prior CABG. Stable cardiomegaly.  IMPRESSION: Tracheostomy tube with the tip 3 cm above the carina.   Electronically Signed   By: Elige KoHetal  Patel   On: 07/28/2013 09:19   Dg Chest Port 1 View  07/28/2013   CLINICAL DATA:  Evaluate endotracheal tube.  EXAM: PORTABLE CHEST - 1 VIEW  COMPARISON:  07/27/2013  FINDINGS: Endotracheal tube is 4.4 cm above the carina. Again noted is a left ventricular assist device. PICC line tip in the lower SVC. Patient is rotated which limits evaluation of the left lung base. There are residual or persistent densities at the left lung base. Hazy densities at the right lung base suggest atelectasis. No evidence for a pneumothorax. Heart size is stable and there are median sternotomy wires. Nasogastric tube extends into the abdomen. Stable position of the cardiac ICD.  IMPRESSION: Persistent basilar lung densities with minimal change. Findings could represent a combination of atelectasis and small effusions.  Support apparatuses as described.   Electronically Signed   By: Richarda OverlieAdam  Henn M.D.   On: 07/28/2013 07:49   Dg Abd Portable 1v  07/28/2013   CLINICAL DATA:  Nasogastric tube placement  EXAM: PORTABLE ABDOMEN - 1 VIEW  COMPARISON:  Portable exam 1333 hr compared to 07/25/2013  FINDINGS: Left ventricular assist device project over left upper quadrant.   Nasogastric tube extends into stomach.  AICD lead projects over right ventricle.  Visualized bowel gas pattern normal.  No acute osseous findings.  IMPRESSION: Nasogastric tube projects over proximal to mid stomach.   Electronically Signed   By: Ulyses SouthwardMark  Boles M.D.   On: 07/28/2013 13:47    ASSESSMENT / PLAN:  PULMONARY A: Acute respiratory failure 2nd to Rt MCA-CVA and difficulty with airway secretions.  Re-intubated 2/01. Bloody oral secretions ?from traumatic intubation 2/01 -no active bleed on ENT exam/directlaryngoscopy P:   -SBTs, hope to rapidly wean to ATC    CARDIOVASCULAR A:  NSTEMI (felt to be Pump thrombosis) Acute on Chronic systolic HF (EF 10-15%) [facility]/ [facility]  Cardiogenic shock  P - LVAD per  cards, co-ox improving - Levophed for MAP >70 - Asa, plavix per cards >> on hold since  2/01 in setting of bloody oral secretions -heparin resumed 2/4  RENAL A:   AKI in setting of hypoperfusion, s/p contrast load for cerebral arteriogram >> improved. P:   -F/u chemistry    GASTROINTESTINAL A:   Nutrition. P:   -PPI for SUP -resumed tube feeds after trach, PEG plan for 2/6 -hold plavix until then -bowel regimen  HEMATOLOGIC A:   Chronic anticoagulation (on coumadin for LVAD) Concern about acute pump related embolic events P:  - Heparin per pharmacy  -INR corrected by FFP  INFECTIOUS A:   ? aspiration pneumonia P:   -dc zosyn,  ENDOCRINE A:   Mild hyperglycemia  P:   -SSI if glucose > 150.  NEUROLOGIC A:   Right MCA embolic CVA. Not amendable to IR  P:   -per neuro -dc precedex, prn fent   D/w Dr. Gala Romney & Neuro Eventually to [facility]  Care during the described time interval was provided by me and/or other providers on the critical care team.  I have reviewed this patient's available data, including medical history, events of note, physical examination and test results as part of my evaluation  CC time x  35  m  ALVA,RAKESH V.  [phone]   07/29/2013, 6:50 AM

## 2013-07-29 NOTE — Progress Notes (Signed)
Stroke Team Progress Note  HISTORY [name] is a 53 y.o. male left-handed male who presented with sudden onset left-sided weakness and aphasia. He was admitted 07/17/2013 with chest pain. He has a history of congestive heart failure and has a left ventricular assist device in place. His INR was low at home - his pump got clotted (likely causing MI), and he loaded himself and became supratherapeutic 4.97 in hospital. He was given vitamin K yesterday 07/18/2013, his INR came down to 1.9. He was then  started on heparin. Around 7:40 AM 07/19/2013, the nurse helped him to get dressed and stated that he seemed sleepy, he was moving his left side well. The patient endorsed that his symptoms started after he woke up this morning, but woke up 07/19/2013 in his normal state. He appeared to understand well. Patient was not administerd TPA secondary to being on systemic heparin and INR 1.9  SUBJECTIVE No family at bedside this am. On trach collar. Interacting with Dr. Pearlean BrownieSethi.  OBJECTIVE Most recent Vital Signs: Filed Vitals:   07/29/13 0600 07/29/13 0700 07/29/13 0740 07/29/13 0800  BP:      Pulse: 72 72 77 74  Temp:    98.7 F (37.1 C)  TempSrc:    Oral  Resp: 18 20 22 26   Height:      Weight:      SpO2: 100% 100% 100% 100%   CBG (last 3)   Recent Labs  07/28/13 2344 07/29/13 0329 07/29/13 0755  GLUCAP 142* 135* 167*    IV Fluid Intake:   . sodium chloride 20 mL/hr at 07/29/13 0600  . amiodarone (NEXTERONE PREMIX) 360 mg/200 mL dextrose 30 mg/hr (07/29/13 0800)  . dexmedetomidine Stopped (07/29/13 0700)  . heparin 1,200 Units/hr (07/29/13 0800)  . nitroGLYCERIN Stopped (07/17/13 1800)  . norepinephrine (LEVOPHED) Adult infusion 7 mcg/min (07/29/13 0800)    MEDICATIONS  . allopurinol  100 mg Oral Daily  . antiseptic oral rinse  15 mL Mouth Rinse QID  . chlorhexidine  15 mL Mouth Rinse BID  . etomidate  40 mg Intravenous Once  . feeding supplement (PRO-STAT SUGAR FREE 64)  30 mL Per  Tube TID  . feeding supplement (VITAL HIGH PROTEIN)  1,000 mL Per Tube Q24H  . insulin aspart  0-15 Units Subcutaneous Q4H  . magnesium oxide  400 mg Oral Daily  . pantoprazole (PROTONIX) IV  40 mg Intravenous Q24H  . piperacillin-tazobactam (ZOSYN)  IV  3.375 g Intravenous Q8H  . sodium chloride  10-40 mL Intracatheter Q12H   PRN:  acetaminophen, fentaNYL, hydrALAZINE, midazolam, nitroGLYCERIN, ondansetron (ZOFRAN) IV  Diet:  NPO  Activity:  Bedrest DVT Prophylaxis:  IV heparin  CLINICALLY SIGNIFICANT STUDIES Basic Metabolic Panel:   Recent Labs Lab 07/25/13 0515 07/26/13 0030  07/28/13 0330 07/29/13 0154  NA 141 136*  < > 141 141  K 4.0 3.5*  < > 3.7 3.7  CL 101 96  < > 104 106  CO2 28 26  < > 26 23  GLUCOSE 98 120*  < > 134* 156*  BUN 13 17  < > 20 17  CREATININE 1.13 1.15  < > 0.94 0.88  CALCIUM 8.9 9.0  < > 8.4 8.3*  MG 2.2 2.0  --   --   --   PHOS 3.7 3.1  --   --   --   < > = values in this interval not displayed. Liver Function Tests:   Recent Labs Lab 07/28/13 0330  AST 27  ALT 16  ALKPHOS 63  BILITOT 0.9  PROT 6.8  ALBUMIN 2.6*   CBC:   Recent Labs Lab 07/28/13 0330 07/29/13 0154  WBC 7.2 6.3  HGB 9.0* 8.5*  HCT 27.1* 26.0*  MCV 88.6 89.7  PLT 303 319   Coagulation:   Recent Labs Lab 07/27/13 0440 07/27/13 2110 07/28/13 0330 07/29/13 0154  LABPROT 22.8* 17.9* 17.0* 19.4*  INR 2.09* 1.52* 1.42 1.69*   Cardiac Enzymes:  No results found for this basename: CKTOTAL, CKMB, CKMBINDEX, TROPONINI,  in the last 168 hours Urinalysis: No results found for this basename: COLORURINE, APPERANCEUR, LABSPEC, PHURINE, GLUCOSEU, HGBUR, BILIRUBINUR, KETONESUR, PROTEINUR, UROBILINOGEN, NITRITE, LEUKOCYTESUR,  in the last 168 hours Lipid Panel    Component Value Date/Time   CHOL 166 07/20/2013 0020   TRIG 96 07/20/2013 0020   HDL 63 07/20/2013 0020   CHOLHDL 2.6 07/20/2013 0020   VLDL 19 07/20/2013 0020   LDLCALC 84 07/20/2013 0020   HgbA1C  Lab  Results  Component Value Date   HGBA1C 5.7* 07/19/2013    Urine Drug Screen:     Component Value Date/Time   LABOPIA NONE DETECTED 06/21/2008 1300   COCAINSCRNUR NONE DETECTED 06/21/2008 1300   LABBENZ NONE DETECTED 06/21/2008 1300   AMPHETMU NONE DETECTED 06/21/2008 1300   THCU NONE DETECTED 06/21/2008 1300   LABBARB  Value: NONE DETECTED        DRUG SCREEN FOR MEDICAL PURPOSES ONLY.  IF CONFIRMATION IS NEEDED FOR ANY PURPOSE, NOTIFY LAB WITHIN 5 DAYS. 06/21/2008 1300    Alcohol Level: No results found for this basename: ETH,  in the last 168 hours   CT of the brain   07/20/2013 1. New hypodensities in the left basal ganglia and left thalamus,  concerning for evolving acute infarcts. Smaller foci of hypoattenuation in the right caudate and thalamus may also represent acute ischemia.  2. New paranasal sinus mucosal disease. 07/19/2013    1. No acute intracranial process. 2. Very minimal amount of geographic subcortical encephalomalacia within in the anterior lateral aspect of the left temporal lobe, at the site of the patient's prior hemorrhagic contusion.   Cerebral Angiogram 07/20/2013 Rt CFa approach. Distal small filling defects in the the orbitofrontal br of RT MCA sup division,and tertiary angular division of RT MCA  MRI of the brain    2D Echocardiogram  EF 10%. Diffuse kypokinesis  TEE 07/21/2013 no evidence of clot but with RV failure.   CXR   07/28/2013 Tracheostomy tube with the tip 3 cm above the carina.   EKG  sinus tachycardia. For complete results please see formal report.   Therapy Recommendations   Physical Exam   Young african [place] male with tracheostomy on ventilator. . Afebrile. Head is nontraumatic. Neck is supple without bruit.. Cardiac exam no murmur or gallop. Lungs are clear to auscultation. Distal pulses are well felt. LVAD right side abdomen Neurological Exam : Drowsy but awakens  and follows commands well. Pupils 3 mm sluggishly reactive. Doll's eye  movements are sluggish.Eyes partially open and will blink to threat and follow gaze. No facial grimacing to pain. No spontaneous extremity movements.  L sided hemiplegia, following commands on R side.   ASSESSMENT [name] is a 53 y.o. male presenting with left sided weakness.  Imaging supports left basal ganglia and thalamic infarcts larger than right basal ganglia subcortical  infarcts. Infarcts cardioembolic secondary to potential sources of atrial fibrillation, LVAD clot and/or acute MI.  On aspirin  81 mg orally every day, clopidogrel 75 mg orally every day and warfarin prior to admission. Now on intravenous heparin for secondary stroke prevention. Patient with resultant left arm hemiplegia, left leg hemiparesis, dysphagia, VDRF.  Respiratory failure, extubated 07/25/2013 with profuse oral bleeding, reintubated same day to protect airway - IV heparin, aspirin and plavix on hold. Trach placement delayed secondary to elevated INR. INR 2.09. FFP given prior to trach placement. Trach placxed 07/28/2013 by ENT without difficulty.   Systolic HF, EF 10%, has LVAD. On transplant list prior to admission. Given new stroke, no longer a transplant candidate.  new onset atrial fibrillation with RVR  NSTEMI - Elevated troponins - will manage medically hypertension Chronic RI Hyperlipidemia, LDL 84, on no statin PTA, now on no statin, at goal LDL < 100  obstructive sleep apnea Hx migraines Bipolar affective disorder TEE performed 07/21/2013. Ejection fraction 15-20%. No obvious clot was noted. INR 1.69  Hospital day # 12  TREATMENT/PLAN  Continue IV heparin for secondary stroke prevention. Given hx afib, recommend resumption of oral anticoagulants once medically stable. If not placed on anticoagulant at time of discharge, will need documentation of contraindication  PEG placement later in the week by IR (last dose Plavix 2/1, LVAD hook-up precludes blind placement, INR 1.69 today)  LTACH  placement anticipated  Will continue to follow  Annie Main, MSN, RN, ANVP-BC, ANP-BC, GNP-BC [facility] Pager: [phone] 07/29/2013 9:35 AM  I have personally obtained a history, examined the patient, evaluated imaging results, and formulated the assessment and plan of care. I agree with the above. Delia Heady, MD

## 2013-07-29 NOTE — Progress Notes (Signed)
ANTICOAGULATION CONSULT NOTE - Follow Up Consult  Pharmacy Consult for Heparin Indication: LVAD thrombosis/CVA    Allergies  Allergen Reactions  . Ace Inhibitors Cough  . Lexapro [Escitalopram Oxalate] Other (See Comments)    somnolence    Patient Measurements: Height: 5\' 4"  (162.6 cm) Weight: 196 lb 3.4 oz (89 kg) IBW/kg (Calculated) : 59.2 Heparin Dosing Weight: 77kg  Vital Signs: Temp: 98.7 F (37.1 C) (02/05 0800) Temp src: Oral (02/05 0800) BP: 115/89 mmHg (02/05 0336) Pulse Rate: 85 (02/05 1123)  Labs:  Recent Labs  07/27/13 0440 07/27/13 2110 07/28/13 0330 07/28/13 2100 07/29/13 0154  HGB 9.8*  --  9.0*  --  8.5*  HCT 29.1*  --  27.1*  --  26.0*  PLT 305  --  303  --  319  APTT  --  39*  --   --   --   LABPROT 22.8* 17.9* 17.0*  --  19.4*  INR 2.09* 1.52* 1.42  --  1.69*  HEPARINUNFRC  --   --   --  0.38 0.45  CREATININE 0.94  --  0.94  --  0.88    Estimated Creatinine Clearance: 101 ml/min (by C-G formula based on Cr of 0.88).  . sodium chloride 20 mL/hr at 07/29/13 0600  . amiodarone (NEXTERONE PREMIX) 360 mg/200 mL dextrose 30 mg/hr (07/29/13 0800)  . dexmedetomidine Stopped (07/29/13 0700)  . heparin 1,200 Units/hr (07/29/13 0800)  . nitroGLYCERIN Stopped (07/17/13 1800)  . norepinephrine (LEVOPHED) Adult infusion 7 mcg/min (07/29/13 0800)    Assessment: 52yom with LVAD, suspected pump thrombosis and acute CVA continues on heparin. Heparin level (0.45) is therapeutic after restart - continue current rate and follow-up AM heparin level.  INR 1.69 today, likely from liver congestion, no Coumadin given since admission. - H/H trending down, Plts stable - Continues to have a little epistaxis and bleeding from mouth, but improving.  Goal of Therapy:  Heparin level 0.3-0.5 units/ml Monitor platelets by anticoagulation protocol: Yes   Plan:  1. Continue heparin drip 1200 units/hr (12 ml/hr) 2. Daily heparin level and CBC. 3. Will continue to  monitor bleeding. 4. F/U plans for PEG placement.  Tad Moore, BCPS  Clinical Pharmacist Pager [phone]  07/29/2013 12:23 PM

## 2013-07-29 NOTE — H&P (Signed)
[name] is an 53 y.o. male.   Chief Complaint: Multiple medical issues Difficult hospital course Pt with severe CHF- EF 10-15% Mitral regurg--ventrical assist device placed 08/2011 New CVA secondary LV thrombus Admitted 07/17/13 with NSTEMI prob secondary to thrombus On vent/trach Pt with malnutrition and need for long term care prob to [facility] Scheduled for percutaneous gastric tube placement in IR Off Plavix since 2/1 Films reviewed by Dr Astrid Drafts not have a safe window for G tube due to lower portion of ventricular device We will prepare pt for procedure in am---see if window available to safely proceed  HPI: CRI; CVA; NSVT/ICD; CHF; MR; smoker; subs abuse; HLD; OSA; Bipolar  Past Medical History  Diagnosis Date  . CHF (congestive heart failure)     EF- 10-15  . Medically noncompliant   . Mitral regurgitation   . Tobacco user   . HTN (hypertension)   . AICD (automatic cardioverter/defibrillator) present   . GERD (gastroesophageal reflux disease)   . Substance abuse   . Chronic renal insufficiency   . Syncope   . Thrombus 08/06/2010  . SYSTOLIC HEART FAILURE, CHRONIC 09/22/2008    Qualifier: Diagnosis of  By: Haroldine Laws, MD, Eileen Stanford   . LV (left ventricular) mural thrombus 01/28/2011  . ICD - IN SITU 09/16/2008    Qualifier: Diagnosis of  By: Peri Maris    . MITRAL STENOSIS/ INSUFFICIENCY, NON-RHEUMATIC 09/22/2008    Qualifier: Diagnosis of  By: Haroldine Laws, MD, Eileen Stanford Hepatomegaly 09/16/2008    Qualifier: Diagnosis of  By: Peri Maris    . High cholesterol 02/26/2012    "at one time"  . Sleep apnea   . Exertional dyspnea 02/26/2012  . History of blood transfusion 08/2011    "when I had heart pump"  . Migraines   . COMMON MIGRAINE 06/14/2009    Qualifier: Diagnosis of  By: Jenny Reichmann MD, Hunt Oris   . History of gout 02/26/2012  . Depression   . Bipolar affective disorder 10/22/2011    pt denies this hx 02/26/2012    Past Surgical History   Procedure Laterality Date  . Cardiac defibrillator placement  ~ 2008  . Left ventricular assist device  08/2011  . Radiology with anesthesia N/A 07/19/2013    Procedure: RADIOLOGY WITH ANESTHESIA;  Surgeon: Rob Hickman, MD;  Location: [facility];  Service: Radiology;  Laterality: N/A;  . Tracheostomy      feinstein    Family History  Problem Relation Age of Onset  . Coronary artery disease Neg Hx    Social History:  reports that he quit smoking about 2 years ago. His smoking use included Cigarettes. He has a 8.25 pack-year smoking history. He quit smokeless tobacco use about 2 years ago. He reports that he does not drink alcohol or use illicit drugs.  Allergies:  Allergies  Allergen Reactions  . Ace Inhibitors Cough  . Lexapro [Escitalopram Oxalate] Other (See Comments)    somnolence    Medications Prior to Admission  Medication Sig Dispense Refill  . amLODipine (NORVASC) 10 MG tablet Take 10 mg by mouth daily.      Marland Kitchen aspirin EC 81 MG tablet Take 81 mg by mouth daily.      . carvedilol (COREG) 25 MG tablet Take 50 mg by mouth 2 (two) times daily with a meal.      . clopidogrel (PLAVIX) 75 MG tablet Take 1 tablet (75 mg total) by mouth daily with  breakfast.  30 tablet  4  . gabapentin (NEURONTIN) 300 MG capsule Take 300 mg by mouth 3 (three) times daily.      . hydrALAZINE (APRESOLINE) 100 MG tablet Take 100 mg by mouth 3 (three) times daily.      Marland Kitchen losartan (COZAAR) 100 MG tablet Take 100 mg by mouth daily.      . magnesium oxide (MAG-OX) 400 MG tablet Take 400 mg by mouth daily.      . potassium chloride (K-DUR,KLOR-CON) 10 MEQ tablet Take 20 mEq by mouth daily.      . ranitidine (ZANTAC) 150 MG tablet Take 150 mg by mouth 2 (two) times daily.       Marland Kitchen torsemide (DEMADEX) 20 MG tablet Take 2 tablets (40 mg total) by mouth daily.  30 tablet  3  . warfarin (COUMADIN) 5 MG tablet Take 5 mg by mouth daily.       Marland Kitchen allopurinol (ZYLOPRIM) 100 MG tablet Take 100 mg by mouth as  needed. For gout        Results for orders placed during the hospital encounter of 07/17/13 (from the past 48 hour(s))  GLUCOSE, CAPILLARY     Status: Abnormal   Collection Time    07/27/13 11:39 AM      Result Value Range   Glucose-Capillary 117 (*) 70 - 99 mg/dL  TYPE AND SCREEN     Status: None   Collection Time    07/27/13 12:00 PM      Result Value Range   ABO/RH(D) O POS     Antibody Screen NEG     Sample Expiration 07/30/2013    GLUCOSE, CAPILLARY     Status: Abnormal   Collection Time    07/27/13  3:42 PM      Result Value Range   Glucose-Capillary 113 (*) 70 - 99 mg/dL  PREPARE FRESH FROZEN PLASMA     Status: None   Collection Time    07/27/13  4:00 PM      Result Value Range   Unit Number [id number]     Blood Component Type THAWED PLASMA     Unit division 00     Status of Unit ISSUED,FINAL     Transfusion Status OK TO TRANSFUSE     Unit Number [id number]     Blood Component Type THAWED PLASMA     Unit division 00     Status of Unit REL FROM [facility]     Transfusion Status OK TO TRANSFUSE    GLUCOSE, CAPILLARY     Status: Abnormal   Collection Time    07/27/13  7:31 PM      Result Value Range   Glucose-Capillary 118 (*) 70 - 99 mg/dL   Comment 1 Documented in Chart     Comment 2 Notify RN    PROTIME-INR     Status: Abnormal   Collection Time    07/27/13  9:10 PM      Result Value Range   Prothrombin Time 17.9 (*) 11.6 - 15.2 seconds   INR 1.52 (*) 0.00 - 1.49  APTT     Status: Abnormal   Collection Time    07/27/13  9:10 PM      Result Value Range   aPTT 39 (*) 24 - 37 seconds   Comment:            IF BASELINE aPTT IS ELEVATED,     SUGGEST PATIENT RISK ASSESSMENT     BE USED  TO DETERMINE APPROPRIATE     ANTICOAGULANT THERAPY.  GLUCOSE, CAPILLARY     Status: Abnormal   Collection Time    07/27/13 11:22 PM      Result Value Range   Glucose-Capillary 146 (*) 70 - 99 mg/dL   Comment 1 Documented in Chart     Comment 2 Notify RN    CBC      Status: Abnormal   Collection Time    07/28/13  3:30 AM      Result Value Range   WBC 7.2  4.0 - 10.5 K/uL   RBC 3.06 (*) 4.22 - 5.81 MIL/uL   Hemoglobin 9.0 (*) 13.0 - 17.0 g/dL   HCT 27.1 (*) 39.0 - 52.0 %   MCV 88.6  78.0 - 100.0 fL   MCH 29.4  26.0 - 34.0 pg   MCHC 33.2  30.0 - 36.0 g/dL   RDW 14.9  11.5 - 15.5 %   Platelets 303  150 - 400 K/uL  LACTATE DEHYDROGENASE     Status: Abnormal   Collection Time    07/28/13  3:30 AM      Result Value Range   LDH 937 (*) 94 - 250 U/L  PROTIME-INR     Status: Abnormal   Collection Time    07/28/13  3:30 AM      Result Value Range   Prothrombin Time 17.0 (*) 11.6 - 15.2 seconds   INR 1.42  0.00 - 6.38  BASIC METABOLIC PANEL     Status: Abnormal   Collection Time    07/28/13  3:30 AM      Result Value Range   Sodium 141  137 - 147 mEq/L   Potassium 3.7  3.7 - 5.3 mEq/L   Chloride 104  96 - 112 mEq/L   CO2 26  19 - 32 mEq/L   Glucose, Bld 134 (*) 70 - 99 mg/dL   BUN 20  6 - 23 mg/dL   Creatinine, Ser 0.94  0.50 - 1.35 mg/dL   Calcium 8.4  8.4 - 10.5 mg/dL   GFR calc non Af Amer >90  >90 mL/min   GFR calc Af Amer >90  >90 mL/min   Comment: (NOTE)     The eGFR has been calculated using the CKD EPI equation.     This calculation has not been validated in all clinical situations.     eGFR's persistently <90 mL/min signify possible Chronic Kidney     Disease.  HEPATIC FUNCTION PANEL     Status: Abnormal   Collection Time    07/28/13  3:30 AM      Result Value Range   Total Protein 6.8  6.0 - 8.3 g/dL   Albumin 2.6 (*) 3.5 - 5.2 g/dL   AST 27  0 - 37 U/L   ALT 16  0 - 53 U/L   Alkaline Phosphatase 63  39 - 117 U/L   Total Bilirubin 0.9  0.3 - 1.2 mg/dL   Bilirubin, Direct 0.4 (*) 0.0 - 0.3 mg/dL   Indirect Bilirubin 0.5  0.3 - 0.9 mg/dL  GLUCOSE, CAPILLARY     Status: Abnormal   Collection Time    07/28/13  3:53 AM      Result Value Range   Glucose-Capillary 112 (*) 70 - 99 mg/dL   Comment 1 Documented in Chart      Comment 2 Notify RN    CARBOXYHEMOGLOBIN     Status: Abnormal   Collection Time  07/28/13  5:00 AM      Result Value Range   Total hemoglobin 9.2 (*) 13.5 - 18.0 g/dL   O2 Saturation 57.2     Carboxyhemoglobin 1.6 (*) 0.5 - 1.5 %   Methemoglobin 1.0  0.0 - 1.5 %  GLUCOSE, CAPILLARY     Status: Abnormal   Collection Time    07/28/13  7:38 AM      Result Value Range   Glucose-Capillary 122 (*) 70 - 99 mg/dL  GLUCOSE, CAPILLARY     Status: Abnormal   Collection Time    07/28/13 12:11 PM      Result Value Range   Glucose-Capillary 100 (*) 70 - 99 mg/dL  GLUCOSE, CAPILLARY     Status: Abnormal   Collection Time    07/28/13  3:45 PM      Result Value Range   Glucose-Capillary 111 (*) 70 - 99 mg/dL  GLUCOSE, CAPILLARY     Status: Abnormal   Collection Time    07/28/13  7:34 PM      Result Value Range   Glucose-Capillary 105 (*) 70 - 99 mg/dL   Comment 1 Documented in Chart     Comment 2 Notify RN    HEPARIN LEVEL (UNFRACTIONATED)     Status: None   Collection Time    07/28/13  9:00 PM      Result Value Range   Heparin Unfractionated 0.38  0.30 - 0.70 IU/mL   Comment:            IF HEPARIN RESULTS ARE BELOW     EXPECTED VALUES, AND PATIENT     DOSAGE HAS BEEN CONFIRMED,     SUGGEST FOLLOW UP TESTING     OF ANTITHROMBIN III LEVELS.  GLUCOSE, CAPILLARY     Status: Abnormal   Collection Time    07/28/13 11:44 PM      Result Value Range   Glucose-Capillary 142 (*) 70 - 99 mg/dL   Comment 1 Documented in Chart     Comment 2 Notify RN    CBC     Status: Abnormal   Collection Time    07/29/13  1:54 AM      Result Value Range   WBC 6.3  4.0 - 10.5 K/uL   RBC 2.90 (*) 4.22 - 5.81 MIL/uL   Hemoglobin 8.5 (*) 13.0 - 17.0 g/dL   HCT 26.0 (*) 39.0 - 52.0 %   MCV 89.7  78.0 - 100.0 fL   MCH 29.3  26.0 - 34.0 pg   MCHC 32.7  30.0 - 36.0 g/dL   RDW 15.1  11.5 - 15.5 %   Platelets 319  150 - 400 K/uL  LACTATE DEHYDROGENASE     Status: Abnormal   Collection Time    07/29/13   1:54 AM      Result Value Range   LDH 870 (*) 94 - 250 U/L  PROTIME-INR     Status: Abnormal   Collection Time    07/29/13  1:54 AM      Result Value Range   Prothrombin Time 19.4 (*) 11.6 - 15.2 seconds   INR 1.69 (*) 0.00 - 7.32  BASIC METABOLIC PANEL     Status: Abnormal   Collection Time    07/29/13  1:54 AM      Result Value Range   Sodium 141  137 - 147 mEq/L   Potassium 3.7  3.7 - 5.3 mEq/L   Chloride 106  96 - 112  mEq/L   CO2 23  19 - 32 mEq/L   Glucose, Bld 156 (*) 70 - 99 mg/dL   BUN 17  6 - 23 mg/dL   Creatinine, Ser 0.88  0.50 - 1.35 mg/dL   Calcium 8.3 (*) 8.4 - 10.5 mg/dL   GFR calc non Af Amer >90  >90 mL/min   GFR calc Af Amer >90  >90 mL/min   Comment: (NOTE)     The eGFR has been calculated using the CKD EPI equation.     This calculation has not been validated in all clinical situations.     eGFR's persistently <90 mL/min signify possible Chronic Kidney     Disease.  HEPARIN LEVEL (UNFRACTIONATED)     Status: None   Collection Time    07/29/13  1:54 AM      Result Value Range   Heparin Unfractionated 0.45  0.30 - 0.70 IU/mL   Comment:            IF HEPARIN RESULTS ARE BELOW     EXPECTED VALUES, AND PATIENT     DOSAGE HAS BEEN CONFIRMED,     SUGGEST FOLLOW UP TESTING     OF ANTITHROMBIN III LEVELS.  GLUCOSE, CAPILLARY     Status: Abnormal   Collection Time    07/29/13  3:29 AM      Result Value Range   Glucose-Capillary 135 (*) 70 - 99 mg/dL   Comment 1 Documented in Chart     Comment 2 Notify RN    CARBOXYHEMOGLOBIN     Status: Abnormal   Collection Time    07/29/13  4:10 AM      Result Value Range   Total hemoglobin 8.1 (*) 13.5 - 18.0 g/dL   O2 Saturation 63.6     Carboxyhemoglobin 1.9 (*) 0.5 - 1.5 %   Methemoglobin 1.5  0.0 - 1.5 %  GLUCOSE, CAPILLARY     Status: Abnormal   Collection Time    07/29/13  7:55 AM      Result Value Range   Glucose-Capillary 167 (*) 70 - 99 mg/dL   Comment 1 Notify RN     Chest Portable 1 View To Assess  Tube Placement And Rule-out Pneumothorax  07/28/2013   CLINICAL DATA:  Trach placement  EXAM: PORTABLE CHEST - 1 VIEW  COMPARISON:  DG CHEST 1V PORT dated 07/28/2013  FINDINGS: There is interval placement of tracheostomy to with the tip 3 cm above the carina. There is a right-sided PICC line in satisfactory position. There is a single lead cardiac pacer. There is bibasilar hazy airspace disease which may represent atelectasis versus developing infiltrate. There is no definite pleural effusion or pneumothorax. Prior CABG. Stable cardiomegaly.  IMPRESSION: Tracheostomy tube with the tip 3 cm above the carina.   Electronically Signed   By: Kathreen Devoid   On: 07/28/2013 09:19   Dg Chest Port 1 View  07/28/2013   CLINICAL DATA:  Evaluate endotracheal tube.  EXAM: PORTABLE CHEST - 1 VIEW  COMPARISON:  07/27/2013  FINDINGS: Endotracheal tube is 4.4 cm above the carina. Again noted is a left ventricular assist device. PICC line tip in the lower SVC. Patient is rotated which limits evaluation of the left lung base. There are residual or persistent densities at the left lung base. Hazy densities at the right lung base suggest atelectasis. No evidence for a pneumothorax. Heart size is stable and there are median sternotomy wires. Nasogastric tube extends into the abdomen. Stable position  of the cardiac ICD.  IMPRESSION: Persistent basilar lung densities with minimal change. Findings could represent a combination of atelectasis and small effusions.  Support apparatuses as described.   Electronically Signed   By: Markus Daft M.D.   On: 07/28/2013 07:49   Dg Abd Portable 1v  07/28/2013   CLINICAL DATA:  Nasogastric tube placement  EXAM: PORTABLE ABDOMEN - 1 VIEW  COMPARISON:  Portable exam 1333 hr compared to 07/25/2013  FINDINGS: Left ventricular assist device project over left upper quadrant.  Nasogastric tube extends into stomach.  AICD lead projects over right ventricle.  Visualized bowel gas pattern normal.  No acute  osseous findings.  IMPRESSION: Nasogastric tube projects over proximal to mid stomach.   Electronically Signed   By: Lavonia Dana M.D.   On: 07/28/2013 13:47    Review of Systems  Constitutional: Negative for fever.  Respiratory: Positive for sputum production and wheezing.        Trach/vent  Cardiovascular: Negative for chest pain.  Gastrointestinal: Negative for nausea and vomiting.  Neurological: Positive for weakness.  Psychiatric/Behavioral: Positive for substance abuse.    Blood pressure 115/89, pulse 74, temperature 98.7 F (37.1 C), temperature source Oral, resp. rate 26, height $RemoveBe'5\' 4"'zrIuoMFsD$  (1.626 m), weight 196 lb 3.4 oz (89 kg), SpO2 100.00%. Physical Exam  Constitutional: He is oriented to person, place, and time. He appears well-developed.  Cardiovascular: Normal rate and regular rhythm.   Murmur heard. Respiratory: Effort normal.  GI: Soft. Bowel sounds are normal.  Musculoskeletal:  Moves Rt well Slow on Left if at all  Neurological: He is alert and oriented to person, place, and time.  Skin: Skin is warm.  Psychiatric: He has a normal mood and affect. His behavior is normal. Judgment and thought content normal.  Pt able to sign consent     Assessment/Plan Multiple medical issues Will need long term care Protein calorie malnutrition Scheduled for perc G tube in IR 2/6 We will plan for procedure--see if safe window is available to proceed Pt fully aware of possibly NOT being able to proceed INR 1.69 today--MD to address Off Plavix since 2/1 Check kub Check labs Hep off at 5 am On Zosyn    Noelani Harbach A 07/29/2013, 9:31 AM

## 2013-07-29 NOTE — Care Management Note (Unsigned)
    Page 1 of 2   07/29/2013     2:07:39 PM   CARE MANAGEMENT NOTE 07/29/2013  Patient:  [name], [name]   Account Number:  [id number]  Date Initiated:  07/19/2013  Documentation initiated by:  [facility]  Subjective/Objective Assessment:   NSTEMI - Lvad inserted in March 2013 at [facility].     Action/Plan:   Anticipated DC Date:  08/06/2013   Anticipated DC Plan:  LONG TERM ACUTE CARE (LTAC)      DC Planning Services  CM consult      Choice offered to / List presented to:             Status of service:  In process, will continue to follow Medicare Important Message given?   (If response is "NO", the following Medicare IM given date fields will be blank) Date Medicare IM given:   Date Additional Medicare IM given:    Discharge Disposition:    Per UR Regulation:  Reviewed for med. necessity/level of care/duration of stay  If discussed at Long Length of Stay Meetings, dates discussed:   07/22/2013  07/27/2013    Comments:  Contact:  Michaels,Lucille Mother [phone]  07/29/13 Rylea Selway,RN,BSN [phone] PER KATHY WITH [facility], PT HAS LTAC BENEFIT, AND [facility] IS ABLE TO ACCEPT PT MEDICALLY STABLE FOR TRANSFER.  LTAC WITH NEED 1-2 DAYS NOTICE, AS PT WILL ICU BED.  WILL CONT TO FOLLOW/UPDATE LTAC LIASION AS APPROPRIATE.  07-27-13 Algis Liming, RNBSN [phone] Plan for trach today or tomorrow.  bleeding from trach - heparin stopped but prior CVA due clot burden from Lvad. Discussed with Lvad team discharge disposition - probable [facility].  07-26-13 11:40am Avie Arenas, RNBSN [phone] Extubated, but reintubated traumatically on 07-25-13. Continue on pressors and amio.  May need trach - depending on progression.  07-21-13 9:35pm Avie Arenas, RNBSN [phone] acute right MCA embolic CVA. Not amendable to extraction due to distal location.  now on vent, amio and norepenephrine.

## 2013-07-29 NOTE — Progress Notes (Signed)
SLP Cancellation Note  Patient Details Name: [name] MRN: [id number] DOB: 13-Aug-1960   Cancelled treatment:       Reason Eval/Treat Not Completed: Medical issues which prohibited therapy. Patient completing first attempt at weaning this am. Has plans for PEG tube 2/6. Will likely allow patient time to recover from PEG tube placement and wean from vent and f/u 2/9 for PMSV evaluation   Ferdinand Lango MA, CCC-SLP [phone]    [name] 07/29/2013, 10:24 AM

## 2013-07-29 NOTE — Progress Notes (Signed)
Patient ID: [name], male   DOB: 07-Mar-1961, 53 y.o.   MRN: [id number] Advanced Heart Failure Rounding Note  [name] is 53 y/o male with a history of severe CHF due to NICM EF 10-15%, and severe MR s/p HM II LVAD placed at [facility] March 2013. His medical history also consists of CRI, NSVT s/p ICD and LV thrombus.   Admitted 1/24 with NSTEMI felt secondary due embolic phenomenon due to pump thrombosis.  LDH high around 900.  On way to cath lab 1/26 developed acute onset of aphasia and left-sided hemiparesis. CT negative for bleed. Taken emergently to neuro-interventional suite but no amenable lesions. Remains intubated on levophed. Remains on propofol gtt, unable to assess neuro status.  S/P TEE 1/28. No evidence of clot but with RV failure.   Extubated 1/30. Reintubated 2/2 due to inability control secretions.   Trach yesterday and on trach collar this am. Smiling and denies any pain. Still having epistaxis. INR trending down, 1.6. MAPs 70-90s on levophed gtt 7 mcg. On heparin  Hgb 11.6> 9.8>9.0> 8.5  MAPs 80s LDH:  976>1568>1165>1124>937>870 Co-ox: 62%>57%>63% INR 1.4>1.69  VAD interrogated personally.  Flow 4.2 , Speed 9200 PI 4.9,  Power 5.1   Events: 2-3 PI events  Filed Vitals:   07/29/13 0600 07/29/13 0700 07/29/13 0740 07/29/13 0800  BP:      Pulse: 72 72 77 74  Temp:    98.7 F (37.1 C)  TempSrc:    Oral  Resp: 18 20 22 26   Height:      Weight:      SpO2: 100% 100% 100% 100%    Intake/Output Summary (Last 24 hours) at 07/29/13 0952 Last data filed at 07/29/13 0800  Gross per 24 hour  Intake 2064.57 ml  Output    835 ml  Net 1229.57 ml    LABS: Basic Metabolic Panel:  Recent Labs  70/96/28 0330 07/29/13 0154  NA 141 141  K 3.7 3.7  CL 104 106  CO2 26 23  GLUCOSE 134* 156*  BUN 20 17  CREATININE 0.94 0.88  CALCIUM 8.4 8.3*   Liver Function Tests:  Recent Labs  07/28/13 0330  AST 27  ALT 16  ALKPHOS 63  BILITOT 0.9  PROT 6.8  ALBUMIN 2.6*    No results found for this basename: LIPASE, AMYLASE,  in the last 72 hours CBC:  Recent Labs  07/28/13 0330 07/29/13 0154  WBC 7.2 6.3  HGB 9.0* 8.5*  HCT 27.1* 26.0*  MCV 88.6 89.7  PLT 303 319   Cardiac Enzymes: No results found for this basename: CKTOTAL, CKMB, CKMBINDEX, TROPONINI,  in the last 72 hours RADIOLOGY: Dg Chest Portable 1 View  07/17/2013   CLINICAL DATA:  Left-sided chest pain for 3 days, weakness and dizziness.  EXAM: PORTABLE CHEST - 1 VIEW  COMPARISON:  08/03/2012  FINDINGS: Evidence of median sternotomy noted with LVAD in place. Left-sided defibrillator noted. Mild prominence of the cardiac silhouette persists. Tricuspid valvuloplasty reidentified. Trace left pleural fluid or thickening persists. No new pulmonary opacity.  IMPRESSION: No new acute abnormality. Stable appearance of trace left pleural fluid or thickening.   Electronically Signed   By: Christiana Pellant M.D.   On: 07/17/2013 10:12    PHYSICAL EXAM General: NAD; on trach collar;  Neck: JVP 9; trach Lungs: Diffuse rhonchi CV: LVAD pump hum normal.  Trace edema.   Abdomen: Soft, nontender, no hepatosplenomegaly, no distention. Hypoactive BS Neurologic: awake interactive. Expressive aphasia. L sided paralysis.  Extremities: No clubbing or cyanosis.   TELEMETRY: NSR 80s   Assessment:   1. Acute embolic CVA 2. NSTEMI - Likely pump thrombosis with coronary embolus.  2. Chronic systolic HF s/p HM II VAD  3. NICM EF 15%  4. Hyperkalemia  5. HTN - MAP 70s currently  6. Acute respiratory failure 7. Shock - suspect mixed picture RV failure/vasodilatory 8. Oral bleeding  Plan/Discussion:    He has had NSTEMI and acute CVA due to pump thrombosis/embolism. Now with expressive aphasia and dense L hemiplegia. They have suggested that speech and ability to control secretions will return in several months but L-sided plegia may not recover. T  Improved this am. Had trach yesterday and is tolerating  well. On trach collar this am. Bloody secretions improving, however having epistaxis. Remains on heparin. Off Plavix for possible PEG. If continues to bleed will call ENT to see if they can cauterize. LDH trending down and Cr stable.   Going for Peg tomorrow in IR. They would like INR < 1.5 it is 1.6 today. Would like to hold on giving any FFP. Will recheck INR tonight and reassess.  CVP 10 this am. MAPs and flows very labile likely d/t RV failure and having to titrate levophed. Currently on 7 mcg. Will give 250 cc bolus. Since turning pump speed down, 2-3 PI events.   OOB to chair today and continue to work with PT/OT and Speech.   He will eventually need placement at [facility]. I d/w Dr. Angelina Okrowley.   The patient is critically ill with multiple organ systems failure and requires high complexity decision making for assessment and support, frequent evaluation and titration of therapies, application of advanced monitoring technologies and extensive interpretation of multiple databases.   Critical Care Time devoted to patient care services described in this note is 40 Minutes.  I reviewed the LVAD parameters from today, and compared the results to the patient's prior recorded data. No programming changes were made. The LVAD is functioning within specified parameters. The patient performs LVAD self-test daily. LVAD interrogation was negative for any significant power changes, alarms or PI events/speed drops. LVAD equipment check completed and is in good working order. Back-up equipment present. LVAD education done on emergency procedures and precautions and reviewed exit site care.  Ronen Bromwell,MD 11:07 AM

## 2013-07-29 NOTE — Progress Notes (Signed)
NUTRITION FOLLOW UP  Intervention:     Vital HP at 40 ml/hr with Prostat liquid protein 30 ml TID   Provides 1260 kcals (72% of estimated kcal needs), 129 gm protein (100% of estimated protein needs), 803 ml of water    Nutrition Dx:   Inadequate oral intake related to inability to eat as evidenced by NPO status, ongoing  Goal:   Enteral nutrition to provide 60-70% of estimated calorie needs (22-25 kcals/kg ideal body weight) and 100% of estimated protein needs, based on ASPEN guidelines for permissive underfeeding in critically ill obese individuals, progressing  Monitor:   TF regimen & tolerance, respiratory status, weight, labs, I/O's  Assessment:   Patient with PMH of severe CHF, mitral regurgitation and HTN; s/p LVAD placement at [facility] March 2013, as bridge to transplant; admitted for evaluation of new NSTEMI possibly related to pump thrombosis.   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   Pt extubated 1/31 but re-intubated 2/1. Trach placed 2/4. PEG planned for 2/6. D/C plan is [facility].  Spoke with RN, SLP, and MD. Pt just started trach collar this morning. Not yet ready for SLP evaluation. Per SLP swallow was poor before therefore not likely to start PO diet right away. IR to place PEG in radiology to avoid LVAD wires that are across pt's abdomen.  Per RN pt is tolerating his tube feedings via NGT which was placed 2/4 with tip in body of stomach. Per RN pt will likely require supplemental vent support at night.  Pt with family at bedside.  MV: 8.3 L/min this am prior to trach trial Temp (24hrs), Avg:98.4 F (36.9 C), Min:98 F (36.7 C), Max:98.7 F (37.1 C)   Vital HP @ 40 ml/hr via NGT with 30 ml Prostat TID.  Provides: 1260 kcal, 129 grams protein, 803 ml H2O.   Height: Ht Readings from Last 1 Encounters:  07/17/13 5\' 4"  (1.626 m)    Weight Status  Wt Readings from Last 1 Encounters:  07/29/13 196 lb 3.4 oz  (89 kg)  Admission weight 183 lb (83 kg) Weight range: 175-196 lb Pt is positive 10 L since admission  Re-estimated needs:  Kcal: 1747 Protein: 120-130 gm Fluid: per MD  Skin: groin puncture site  Diet Order: NPO   Intake/Output Summary (Last 24 hours) at 07/29/13 0855 Last data filed at 07/29/13 0800  Gross per 24 hour  Intake 2102.55 ml  Output    885 ml  Net 1217.55 ml   Last BM: 1/30  Labs:   Recent Labs Lab 07/25/13 0515 07/26/13 0030 07/27/13 0440 07/28/13 0330 07/29/13 0154  NA 141 136* 139 141 141  K 4.0 3.5* 3.4* 3.7 3.7  CL 101 96 103 104 106  CO2 28 26 25 26 23   BUN 13 17 19 20 17   CREATININE 1.13 1.15 0.94 0.94 0.88  CALCIUM 8.9 9.0 7.8* 8.4 8.3*  MG 2.2 2.0  --   --   --   PHOS 3.7 3.1  --   --   --   GLUCOSE 98 120* 177* 134* 156*    CBG (last 3)   Recent Labs  07/28/13 2344 07/29/13 0329 07/29/13 0755  GLUCAP 142* 135* 167*    Scheduled Meds: . allopurinol  100 mg Oral Daily  . antiseptic oral rinse  15 mL Mouth Rinse QID  . chlorhexidine  15 mL Mouth Rinse BID  . etomidate  40 mg Intravenous Once  .  feeding supplement (PRO-STAT SUGAR FREE 64)  30 mL Per Tube TID  . feeding supplement (VITAL HIGH PROTEIN)  1,000 mL Per Tube Q24H  . insulin aspart  0-15 Units Subcutaneous Q4H  . magnesium oxide  400 mg Oral Daily  . pantoprazole (PROTONIX) IV  40 mg Intravenous Q24H  . piperacillin-tazobactam (ZOSYN)  IV  3.375 g Intravenous Q8H  . sodium chloride  10-40 mL Intracatheter Q12H    Continuous Infusions: . sodium chloride 20 mL/hr at 07/29/13 0600  . amiodarone (NEXTERONE PREMIX) 360 mg/200 mL dextrose 30 mg/hr (07/29/13 0800)  . dexmedetomidine Stopped (07/29/13 0700)  . heparin 1,200 Units/hr (07/29/13 0800)  . nitroGLYCERIN Stopped (07/17/13 1800)  . norepinephrine (LEVOPHED) Adult infusion 7 mcg/min (07/29/13 0800)    Kendell BaneHeather Shep Porter RD, LDN, CNSC [phone] Pager [phone] After Hours Pager

## 2013-07-30 ENCOUNTER — Inpatient Hospital Stay (HOSPITAL_COMMUNITY): Payer: Medicare Other

## 2013-07-30 LAB — BASIC METABOLIC PANEL
BUN: 14 mg/dL (ref 6–23)
CALCIUM: 8.2 mg/dL — AB (ref 8.4–10.5)
CO2: 24 meq/L (ref 19–32)
CREATININE: 0.78 mg/dL (ref 0.50–1.35)
Chloride: 104 mEq/L (ref 96–112)
GFR calc Af Amer: >90 mL/min (ref >90)
GFR calc non Af Amer: >90 mL/min (ref >90)
Glucose, Bld: 127 mg/dL — ABNORMAL HIGH (ref 70–99)
Potassium: 3.3 mEq/L — ABNORMAL LOW (ref 3.7–5.3)
Sodium: 141 mEq/L (ref 137–147)

## 2013-07-30 LAB — CARBOXYHEMOGLOBIN
CARBOXYHEMOGLOBIN: 1.9 % — AB (ref 0.5–1.5)
METHEMOGLOBIN: 1.3 % (ref 0.0–1.5)
O2 SAT: 70.8 %
Total hemoglobin: 11.7 g/dL — ABNORMAL LOW (ref 13.5–18.0)

## 2013-07-30 LAB — GLUCOSE, CAPILLARY
GLUCOSE-CAPILLARY: 107 mg/dL — AB (ref 70–99)
GLUCOSE-CAPILLARY: 102 mg/dL — AB (ref 70–99)
Glucose-Capillary: 114 mg/dL — ABNORMAL HIGH (ref 70–99)
Glucose-Capillary: 134 mg/dL — ABNORMAL HIGH (ref 70–99)
Glucose-Capillary: 89 mg/dL (ref 70–99)

## 2013-07-30 LAB — CBC
HCT: 23.6 % — ABNORMAL LOW (ref 39.0–52.0)
HEMOGLOBIN: 7.8 g/dL — AB (ref 13.0–17.0)
MCH: 30 pg (ref 26.0–34.0)
MCHC: 33.1 g/dL (ref 30.0–36.0)
MCV: 90.8 fL (ref 78.0–100.0)
PLATELETS: 398 10*3/uL (ref 150–400)
RBC: 2.6 MIL/uL — AB (ref 4.22–5.81)
RDW: 15.9 % — ABNORMAL HIGH (ref 11.5–15.5)
WBC: 9 10*3/uL (ref 4.0–10.5)

## 2013-07-30 LAB — HEPARIN LEVEL (UNFRACTIONATED)
Heparin Unfractionated: 0.2 IU/mL — ABNORMAL LOW (ref 0.30–0.70)
Heparin Unfractionated: 0.3 IU/mL (ref 0.30–0.70)

## 2013-07-30 LAB — PROTIME-INR
INR: 1.36 (ref 0.00–1.49)
Prothrombin Time: 16.4 seconds — ABNORMAL HIGH (ref 11.6–15.2)

## 2013-07-30 LAB — LACTATE DEHYDROGENASE: LDH: 783 U/L — ABNORMAL HIGH (ref 94–250)

## 2013-07-30 LAB — PREPARE RBC (CROSSMATCH)

## 2013-07-30 MED ORDER — HEPARIN (PORCINE) IN NACL 100-0.45 UNIT/ML-% IJ SOLN
1200.0000 [IU]/h | INTRAMUSCULAR | Status: DC
  Administered 2013-07-30 – 2013-08-03 (×5): 1200 [IU]/h via INTRAVENOUS
  Filled 2013-07-30 (×9): qty 250

## 2013-07-30 MED ORDER — WARFARIN - PHARMACIST DOSING INPATIENT
Freq: Every day | Status: DC
  Administered 2013-08-03 – 2013-08-10 (×3)

## 2013-07-30 MED ORDER — WARFARIN SODIUM 5 MG PO TABS
5.0000 mg | ORAL_TABLET | Freq: Once | ORAL | Status: AC
  Administered 2013-07-30: 5 mg via ORAL
  Filled 2013-07-30: qty 1

## 2013-07-30 MED ORDER — IOHEXOL 300 MG/ML  SOLN
50.0000 mL | Freq: Once | INTRAMUSCULAR | Status: AC | PRN
Start: 2013-07-30 — End: 2013-07-30
  Administered 2013-07-30: 15 mL

## 2013-07-30 MED ORDER — WARFARIN SODIUM 5 MG PO TABS
5.0000 mg | ORAL_TABLET | Freq: Once | ORAL | Status: DC
Start: 2013-07-30 — End: 2013-07-30
  Filled 2013-07-30: qty 1

## 2013-07-30 MED ORDER — WARFARIN - PHARMACIST DOSING INPATIENT: Freq: Every day | Status: DC

## 2013-07-30 MED ORDER — POTASSIUM CHLORIDE 10 MEQ/50ML IV SOLN
10.0000 meq | INTRAVENOUS | Status: AC
  Administered 2013-07-30 (×3): 10 meq via INTRAVENOUS
  Filled 2013-07-30: qty 50

## 2013-07-30 NOTE — Progress Notes (Signed)
[facility] ADULT ICU REPLACEMENT PROTOCOL FOR AM LAB REPLACEMENT ONLY  The patient does not apply for the [facility] Adult ICU Electrolyte Replacment Protocol based on the criteria listed below:   1. Is GFR >/= 40 ml/min? yes  Patient's GFR today is >90 2. Is urine output >/= 0.5 ml/kg/hr for the last 6 hours? no Patient's UOP is Condom catheter broke and unable to record urine amount 3. Is BUN < 60 mg/dL? yes  Patient's BUN today is 14 4. Abnormal electrolyte(s):K 3.35. Ordered repletion with: NA 6. If a panic level lab has been reported, has the CCM MD in charge been notified? yes.   Physician:  Dr Molli Knock. He will defer to rounding MD. Aware of peg procedure.  [facility], Kaeson Kleinert A 07/30/2013 6:23 AM

## 2013-07-30 NOTE — Op Note (Signed)
[name], [name] NO.:  [id number]  MEDICAL RECORD NO.:  [id number]  LOCATION:                                 FACILITY:  PHYSICIAN:  Nelda Bucks, MD DATE OF BIRTH:  08-18-1960  DATE OF PROCEDURE: DATE OF DISCHARGE:                              OPERATIVE REPORT   This is a percutaneous tracheostomy.  PREOPERATIVE DIAGNOSES:  Acute respiratory failure, congestive heart failure, left ventricular assist device placement, stroke, inability to protect airway with some bleeding airway.  POSTOPERATIVE DIAGNOSES:  Status post tracheostomy secondary to acute respiratory failure, stroke, inability to protect airway.  The patient was on anticoagulation prior to the procedure.  __________ was provided to normalize INR after discussion with the heart failure team.  Consent was obtained from the patient's mother, fully aware of risks and benefits of the procedure including infection, bleeding, pneumothorax, and death.  The patient was placed in supine position. Chlorhexidine preparation was used to sterilize the operative site.  The bronchoscopist, Dr. Vassie Loll placed a bronchoscope through the endotracheal tube, backed up to approximately 16 cm without losing patient's airway. The 6 mL of lidocaine plus epinephrine was injected in the operative site over the 2nd endotracheal space.  An 1.2 cm vertical incision was made and dissection was made through the subcutaneous fat to identify the strap muscles.  We were able to dissect through the strap muscles. There were no venous arterial structures noted.  I placed an 18-gauge needle over a white catheter sheath into the airway successfully without any posterior wall injury.  The needle was removed.  The white catheter sheath remained.  Wire was placed over a white catheter sheath and a white catheter sheath was removed.  A 14-French punch dilator was placed in and out of the airway.  A progressive Rhino dilator over  the glider was placed over the wire in and out of the airway approximately 36- [place] dilator was removed, and the wire and glider remained.  I then placed a size 28-French dilator over 8 Shiley tracheostomy over the glider and wire successfully into the airway.  Everything was removed except for the tracheostomy.  __________ knew that this patient had increased risk of bleeding postoperatively secondary to his need for anticoagulation for his LVAD, therefore I placed Surgicel into the wound surrounding the tracheostomy site, which was dry in hope that there was some oozing with anticoagulation, then the Surgicel would limit complication.  This was placed very easily.  The tracheostomy was then sutured in place with 4 monofilament sutures.  The bronchoscopist was placed a bronchoscope through the new tracheostomy tube, noted carina approximately 4 cm below without any posterior wall injury.  Blood loss for the procedure was less than 5 mL.  The patient tolerated the procedure quite well.  Postoperative chest x-ray revealed a well placed tracheostomy with no apparent complications.     Nelda Bucks, MD     DJF/MEDQ  D:  07/28/2013  T:  07/29/2013  Job:  [id number]  cc:   Oretha Milch, MD Suzanna Obey, M.D.

## 2013-07-30 NOTE — Progress Notes (Signed)
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 He is s/p LVAD placement at [facility] March 2013, as bridge to transplant. Admitted for evaluation of new NSTEMI felt possibly to be related to pump thrombosis. Hosp course complicated by episodes of CP and hypotension on 1/24 pm hours. 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 PCCM asked to see post-op for vent weaning.    SIGNIFICANT EVENTS / STUDIES:  1/24: admitted w/ CP and trop rise. Did have sub-therapeutic INR at home so concern was for pump thrombosis.  1/24: evening had CP, rising Trop I, got mso4 and NTG>>>got hypotensive requiring pressors.  1/25 INR supra-therapeutic. Coumadin stopped, heparin started 1/26: CT head: negative for bleed.  1/26:cerebral arteriogram:  acute right MCA embolic CVA. Not amendable to extraction due to distal location.  1/30 Extubated but airway protection is questionable at best 2/01 Reintubated 2/3 ENT eval Jearld Fenton) clots suctioned from mouth - no active bleeding site  LINES / TUBES: LVAD OETT 1/26>>>1/30, 2/1 >>2/4 Trach 2/4 >> Right PICC 1/25>>>   CULTURES: Resp 1/27 >> few GNR >> normal flora  ANTIBIOTICS: 1/27 zosyn (aspiration, GNR) >> 2/5  SUBJECTIVE:   Minimal Bloody oral secretions after starting heparin -held overnight for peg   Tolerates ATC x 24h  afebrile Off dex gtt  VITAL SIGNS: Temp:  [98.3 F (36.8 C)-98.9 F (37.2 C)] 98.7 F (37.1 C) (02/06 0759) Pulse Rate:  [69-116] 87 (02/06 0741) Resp:  [11-30] 20 (02/06 0741) SpO2:  [93 %-100 %] 100 % (02/06 0741) Arterial Line BP: (80-114)/(69-88) 91/77 mmHg (02/06 0600) FiO2 (%):  [35 %-40 %] 35 %  (02/06 0741) Weight:  [87.4 kg (192 lb 10.9 oz)] 87.4 kg (192 lb 10.9 oz) (02/06 0200) HEMODYNAMICS: CVP:  [9 mmHg-13 mmHg] 13 mmHg VENTILATOR SETTINGS: Vent Mode:  [-]  FiO2 (%):  [35 %-40 %] 35 % INTAKE / OUTPUT: Intake/Output     02/05 0701 - 02/06 0700 02/06 0701 - 02/07 0700   I.V. (mL/kg) 1180.8 (13.5)    NG/GT 810    IV Piggyback 100    Total Intake(mL/kg) 2090.8 (23.9)    Urine (mL/kg/hr) 605 (0.3)    Total Output 605     Net +1485.8          Urine Occurrence 1 x     PHYSICAL EXAMINATION: General:  No distress Neuro: interactive, non focal HEENT: trach in place, bloody secretions from oral cavity Cardiovascular:  LVAD pump hum. No edema  Lungs:  Coarse BS, norhonchi  Abdomen:  Soft, nontender  Musculoskeletal:  Intact  Skin:  Intact   LABS:  CBC  Recent Labs Lab 07/28/13 0330 07/29/13 0154 07/30/13 0500  WBC 7.2 6.3 9.0  HGB 9.0* 8.5* 7.8*  HCT 27.1* 26.0* 23.6*  PLT 303 319 398   Coag's  Recent Labs Lab 07/27/13 2110  07/29/13 0154 07/29/13 1830 07/30/13 0500  APTT 39*  --   --   --   --   INR 1.52*  < > 1.69* 1.40 1.36  < > = values in this interval not displayed. BMET  Recent Labs Lab 07/28/13 0330 07/29/13 0154 07/30/13 0500  NA  141 141 141  K 3.7 3.7 3.3*  CL 104 106 104  CO2 26 23 24   BUN 20 17 14   CREATININE 0.94 0.88 0.78  GLUCOSE 134* 156* 127*   Electrolytes  Recent Labs Lab 07/25/13 0515 07/26/13 0030  07/28/13 0330 07/29/13 0154 07/30/13 0500  CALCIUM 8.9 9.0  < > 8.4 8.3* 8.2*  MG 2.2 2.0  --   --   --   --   PHOS 3.7 3.1  --   --   --   --   < > = values in this interval not displayed. Sepsis Markers No results found for this basename: LATICACIDVEN, PROCALCITON, O2SATVEN,  in the last 168 hours ABG  Recent Labs Lab 07/25/13 2007  PHART 7.575*  PCO2ART 33.9*  PO2ART 384.0*   Glucose  Recent Labs Lab 07/29/13 1122 07/29/13 1635 07/29/13 1942 07/29/13 2327 07/30/13 0352 07/30/13 0801  GLUCAP  111* 116* 105* 99 114* 134*    Imaging Dg Abd Portable 1v  07/30/2013   CLINICAL DATA:  Evaluation for gastrostomy tube placement.  EXAM: PORTABLE ABDOMEN - 1 VIEW  COMPARISON:  07/28/2013  FINDINGS: Barium is present throughout the nondistended colon. Left ventricular assist device is seen overlying the left upper quadrant. NG tube is in place.  No dilated bowel.  IMPRESSION: Barium is present throughout the colon.   Electronically Signed   By: Geanie CooleyJim  Maxwell M.D.   On: 07/30/2013 08:03   Dg Abd Portable 1v  07/28/2013   CLINICAL DATA:  Nasogastric tube placement  EXAM: PORTABLE ABDOMEN - 1 VIEW  COMPARISON:  Portable exam 1333 hr compared to 07/25/2013  FINDINGS: Left ventricular assist device project over left upper quadrant.  Nasogastric tube extends into stomach.  AICD lead projects over right ventricle.  Visualized bowel gas pattern normal.  No acute osseous findings.  IMPRESSION: Nasogastric tube projects over proximal to mid stomach.   Electronically Signed   By: Ulyses SouthwardMark  Boles M.D.   On: 07/28/2013 13:47    ASSESSMENT / PLAN:  PULMONARY A: Acute respiratory failure 2nd to Rt MCA-CVA and difficulty with airway secretions.  Re-intubated 2/01. Bloody oral secretions ?from traumatic intubation 2/01 -no active bleed on ENT exam/directlaryngoscopy P:   - ATC as tolerated, may need vent if sedated for PEG   CARDIOVASCULAR A:  NSTEMI (felt to be Pump thrombosis) Acute on Chronic systolic HF (EF 10-15%) w/ [facility] march 2013 [facility].  Cardiogenic shock  P - LVAD per cards, co-ox improving - Levophed for MAP >65 - Asa, plavix per cards >> to resume after PEG -heparin resumed 2/4  RENAL A:   AKI in setting of hypoperfusion, s/p contrast load for cerebral arteriogram >> improved. P:   -F/u chemistry  -replete K   GASTROINTESTINAL A:   Nutrition. P:   -PPI for SUP -resumed tube feeds after PEG today -bowel regimen  HEMATOLOGIC A:   Chronic anticoagulation (on coumadin  for LVAD) Concern about acute pump related embolic events P:  - Heparin per pharmacy  -coumadin at some point  INFECTIOUS A:   ? aspiration pneumonia P:   -observe off abx  ENDOCRINE A:   Mild hyperglycemia  P:   -SSI if glucose > 150.  NEUROLOGIC A:   Right MCA embolic CVA. Not amendable to IR  P:   -per neuro -dc precedex, prn fent   D/w Dr. [name] & Neuro Needs aggressive rehab now [facility]    Kyriakos Babler V.  [phone]  07/30/2013, 9:17 AM

## 2013-07-30 NOTE — Progress Notes (Addendum)
ANTICOAGULATION CONSULT NOTE - Follow Up Consult  Pharmacy Consult for Heparin, resume Coumadin Indication: LVAD thrombosis/CVA    Allergies  Allergen Reactions  . Ace Inhibitors Cough  . Lexapro [Escitalopram Oxalate] Other (See Comments)    somnolence    Patient Measurements: Height: 5\' 4"  (162.6 cm) Weight: 192 lb 10.9 oz (87.4 kg) IBW/kg (Calculated) : 59.2 Heparin Dosing Weight: 77kg  Vital Signs: Temp: 98.4 F (36.9 C) (02/06 1228) Temp src: Oral (02/06 1228) BP: 103/81 mmHg (02/06 1140) Pulse Rate: 87 (02/06 1140)  Labs:  Recent Labs  07/27/13 2110  07/28/13 0330 07/28/13 2100 07/29/13 0154 07/29/13 1830 07/30/13 0500  HGB  --   < > 9.0*  --  8.5*  --  7.8*  HCT  --   --  27.1*  --  26.0*  --  23.6*  PLT  --   --  303  --  319  --  398  APTT 39*  --   --   --   --   --   --   LABPROT 17.9*  --  17.0*  --  19.4* 16.8* 16.4*  INR 1.52*  --  1.42  --  1.69* 1.40 1.36  HEPARINUNFRC  --   --   --  0.38 0.45  --  0.20*  CREATININE  --   --  0.94  --  0.88  --  0.78  < > = values in this interval not displayed.  Estimated Creatinine Clearance: 110.2 ml/min (by C-G formula based on Cr of 0.78).  . sodium chloride 20 mL/hr at 07/30/13 1100  . heparin    . nitroGLYCERIN Stopped (07/17/13 1800)  . norepinephrine (LEVOPHED) Adult infusion 2 mcg/min (07/30/13 0700)    Assessment: 52yom with LVAD, suspected pump thrombosis and acute CVA continues on heparin. Heparin level (0.2) is slightly low this AM, but may have been drawn after heparin turned off at 5 AM.  Now s/p attempt for PEG tube, but unable to place.  Pharmacy asked to resume heparin and Coumadin.    INR 1.36 today, no Coumadin given since admission. - H/H trending down, Plts stable - Continues to have a little epistaxis and bleeding from mouth, but improving.  Goal of Therapy:  Heparin level 0.3-0.5 units/ml Monitor platelets by anticoagulation protocol: Yes   Plan:  1. Resume heparin drip 1200  units/hr (12 ml/hr) 2. Check heparin level in 6 hrs. 3. Don't resume Coumadin for now per Dr. Gala Romney. 4. Daily heparin level, CBC, and INR. 5. Will continue to monitor bleeding. 6. F/U plans for possible J-tube placement.  Reece Leader, Loura Back, BCPS  Clinical Pharmacist Pager [phone]  07/30/2013 3:00 PM       Pm decision made to continue with panda and not place j-tube this admit.   Will restart warfarin - 5mg  x1  Leota Sauers Pharm.D. CPP, BCPS Clinical Pharmacist [phone] 07/30/2013 4:20 PM

## 2013-07-30 NOTE — Progress Notes (Signed)
ANTICOAGULATION CONSULT NOTE - Follow Up Consult  Pharmacy Consult for Heparin, resume Coumadin Indication: LVAD thrombosis/CVA    Allergies  Allergen Reactions  . Ace Inhibitors Cough  . Lexapro [Escitalopram Oxalate] Other (See Comments)    somnolence    Patient Measurements: Height: 5\' 4"  (162.6 cm) Weight: 192 lb 10.9 oz (87.4 kg) IBW/kg (Calculated) : 59.2 Heparin Dosing Weight: 77kg  Vital Signs: Temp: 98.7 F (37.1 C) (02/06 2000) Temp src: Oral (02/06 2000) BP: 105/81 mmHg (02/06 2105) Pulse Rate: 90 (02/06 2105)  Labs:  Recent Labs  07/28/13 0330  07/29/13 0154 07/29/13 1830 07/30/13 0500 07/30/13 2100  HGB 9.0*  --  8.5*  --  7.8*  --   HCT 27.1*  --  26.0*  --  23.6*  --   PLT 303  --  319  --  398  --   LABPROT 17.0*  --  19.4* 16.8* 16.4*  --   INR 1.42  --  1.69* 1.40 1.36  --   HEPARINUNFRC  --   < > 0.45  --  0.20* 0.30  CREATININE 0.94  --  0.88  --  0.78  --   < > = values in this interval not displayed.  Estimated Creatinine Clearance: 110.2 ml/min (by C-G formula based on Cr of 0.78).  . sodium chloride 20 mL/hr at 07/30/13 1700  . heparin 1,200 Units/hr (07/30/13 2100)  . nitroGLYCERIN Stopped (07/17/13 1800)  . norepinephrine (LEVOPHED) Adult infusion Stopped (07/30/13 1700)    Assessment: 52yom with LVAD, suspected pump thrombosis and acute CVA continues on heparin. Heparin level 0.3 heparin drip 1200 uts/hr - resumed today no bolus.  No change - H/H trending down, Plts stable - Continues to have a little epistaxis and bleeding from mouth, but improving.  Goal of Therapy:  Heparin level 0.3-0.5 units/ml Monitor platelets by anticoagulation protocol: Yes   Plan:  1. Continue heparin drip 1200 units/hr (12 ml/hr) 2. Daily heparin level, CBC, and INR. 3. Will continue to monitor bleeding.   Leota Sauers Pharm.D. CPP, BCPS Clinical Pharmacist [phone] 07/30/2013 10:20 PM

## 2013-07-30 NOTE — Progress Notes (Signed)
Patient ID: [name], male   DOB: 02/09/1961, 53 y.o.   MRN: [id number] Advanced Heart Failure Rounding Note  [name] is 53 y/o male with a history of severe CHF due to NICM EF 10-15%, and severe MR s/p HM II LVAD placed at [facility] March 2013. His medical history also consists of CRI, NSVT s/p ICD and LV thrombus.   Admitted 1/24 with NSTEMI felt secondary due embolic phenomenon due to pump thrombosis.  LDH high around 900.  On way to cath lab 1/26 developed acute onset of aphasia and left-sided hemiparesis. CT negative for bleed. Taken emergently to neuro-interventional suite but no amenable lesions. Remains intubated on levophed. Remains on propofol gtt, unable to assess neuro status.  S/P TEE 1/28. No evidence of clot but with RV failure.   Extubated 1/30. Reintubated 2/2 due to inability control secretions.   Trach 2/4 and on trach collar this am. Smiling and denies any pain. MAPs 80-90s on levophed gtt 2 mcg. No further AF on amio. Pending PEG today - heparin on hold. Starting to move L side minimally.   Hgb 11.6> 9.8>9.0> 8.5>7.8   LDH:  976>1568>1165>1124>937>870>783 Co-ox: 62%>57%>63%>71% INR 1.4>1.69>1.36  VAD interrogated personally.  Flow 4.8 , Speed 9200 PI 7.8,  Power 5.2  Events: occasional PI events  Filed Vitals:   07/30/13 0900 07/30/13 1000 07/30/13 1100 07/30/13 1140  BP:    103/81  Pulse: 86 89  87  Temp:      TempSrc:      Resp: 21 19 26 22   Height:      Weight:      SpO2: 100% 100% 100% 100%    Intake/Output Summary (Last 24 hours) at 07/30/13 1229 Last data filed at 07/30/13 1100  Gross per 24 hour  Intake 1790.5 ml  Output    455 ml  Net 1335.5 ml    LABS: Basic Metabolic Panel:  Recent Labs  48/18/56 0154 07/30/13 0500  NA 141 141  K 3.7 3.3*  CL 106 104  CO2 23 24  GLUCOSE 156* 127*  BUN 17 14  CREATININE 0.88 0.78  CALCIUM 8.3* 8.2*   Liver Function Tests:  Recent Labs  07/28/13 0330  AST 27  ALT 16  ALKPHOS 63  BILITOT 0.9   PROT 6.8  ALBUMIN 2.6*   No results found for this basename: LIPASE, AMYLASE,  in the last 72 hours CBC:  Recent Labs  07/29/13 0154 07/30/13 0500  WBC 6.3 9.0  HGB 8.5* 7.8*  HCT 26.0* 23.6*  MCV 89.7 90.8  PLT 319 398   Cardiac Enzymes: No results found for this basename: CKTOTAL, CKMB, CKMBINDEX, TROPONINI,  in the last 72 hours RADIOLOGY: Dg Chest Portable 1 View  07/17/2013   CLINICAL DATA:  Left-sided chest pain for 3 days, weakness and dizziness.  EXAM: PORTABLE CHEST - 1 VIEW  COMPARISON:  08/03/2012  FINDINGS: Evidence of median sternotomy noted with LVAD in place. Left-sided defibrillator noted. Mild prominence of the cardiac silhouette persists. Tricuspid valvuloplasty reidentified. Trace left pleural fluid or thickening persists. No new pulmonary opacity.  IMPRESSION: No new acute abnormality. Stable appearance of trace left pleural fluid or thickening.   Electronically Signed   By: Christiana Pellant M.D.   On: 07/17/2013 10:12    PHYSICAL EXAM General: NAD; on trach collar;  Neck: JVP 9; trach Lungs: Diffuse rhonchi CV: LVAD pump hum normal.  Mild edema. LUE with lymphedema Abdomen: Soft, nontender, no hepatosplenomegaly, no distention. Hypoactive BS Neurologic: awake  interactive. Expressive aphasia. L sided paralysis. Can wiggle L toes and a finger Extremities: No clubbing or cyanosis.   TELEMETRY: NSR 80s   Assessment:   1. Acute embolic CVA 2. NSTEMI - Likely pump thrombosis with coronary embolus.  2. Chronic systolic HF s/p HM II VAD  3. NICM EF 15%  4. Hyperkalemia  5. HTN - MAP 70s currently  6. Acute respiratory failure 7. Shock - suspect mixed picture RV failure/vasodilatory 8. Oral bleeding  Plan/Discussion:    He has had NSTEMI and acute CVA due to pump thrombosis/embolism. Now with expressive aphasia and dense L hemiplegia.   Improved this am. On trach collar this am. PEG pending for today.  Has been struggling with RV failure. CVP 15 this  am. Will try to wean of levophed today. Keep CVP 10-15. Can stop amio as AF has not recurred, Start heparin and coumadin after PEG.   Transfuse 1u RBCs. Can give lasix after as needed.   LDH trending down and Cr stable.   OOB to chair today and continue to work with PT/OT and Speech.   He will eventually need placement at [facility]. I d/w Dr. Angelina Okrowley.   The patient is critically ill with multiple organ systems failure and requires high complexity decision making for assessment and support, frequent evaluation and titration of therapies, application of advanced monitoring technologies and extensive interpretation of multiple databases.   Critical Care Time devoted to patient care services described in this note is 40 Minutes.  I reviewed the LVAD parameters from today, and compared the results to the patient's prior recorded data. No programming changes were made. The LVAD is functioning within specified parameters. The patient performs LVAD self-test daily. LVAD interrogation was negative for any significant power changes, alarms or PI events/speed drops. LVAD equipment check completed and is in good working order. Back-up equipment present. LVAD education done on emergency procedures and precautions and reviewed exit site care.  Daniel Bensimhon,MD 12:29 PM

## 2013-07-30 NOTE — Evaluation (Signed)
Physical Therapy Evaluation Patient Details Name: Eric LovelyUndra [name] MRN: [id number] DOB: 06/01/1961 Today's Date: 07/30/2013 Time: [id number] PT Time Calculation (min): 46 min  PT Assessment / Plan / Recommendation History of Present Illness  [name] is 53 y/o male with a history of severe CHF due to NICM EF 10-15%, and severe MR s/p HM II [facility]. His medical history also consists of CRI, NSVT s/p ICD and LV thrombus. Admitted 1/24 with NSTEMI felt secondary due embolic phenomenon due to pump thrombosis.  LDH high around 900.  Extubated 1/30. Reintubated 2/2 due to inability control secretions. Trach 2/4 and on trach collar this am. Smiling and denies any pain. MAPs 80-90s on levophed gtt 2 mcg. No further AF on amio. Pending PEG today - heparin on hold. Starting to move L side minimally.   Clinical Impression  Pt admitted with NSTEMI and suffered a R cva.  Pt currently limited functionally due to the problems listed. ( See problems list.)   Pt will benefit from PT to maximize function and safety in order to get ready for next venue listed below.     PT Assessment  Patient needs continued PT services    Follow Up Recommendations  LTACH    Does the patient have the potential to tolerate intense rehabilitation      Barriers to Discharge Decreased caregiver support      Equipment Recommendations  Other (comment) (TBA)    Recommendations for Other Services     Frequency Min 3X/week    Precautions / Restrictions Precautions Precautions: Fall Restrictions Weight Bearing Restrictions: No   Pertinent Vitals/Pain VSS , PI maintained at acceptable levels       Mobility  Bed Mobility Overal bed mobility: Needs Assistance;+2 for physical assistance Bed Mobility: Supine to Sit;Sit to Supine Supine to sit: Mod assist;+2 for physical assistance Sit to supine: Total assist;+2 for physical assistance General bed mobility comments: cues for guidance/sequencing, truncal  assist Transfers Overall transfer level: Needs assistance Transfers: Sit to/from Stand Sit to Stand: Max assist;+2 physical assistance (times 3 standing trials) General transfer comment: assist to come forward and lift; control at left knee min/mod; assist for upper trunk extension.    Exercises     PT Diagnosis: Generalized weakness;Hemiplegia non-dominant side  PT Problem List: Decreased strength;Decreased range of motion;Decreased activity tolerance;Decreased balance;Decreased mobility;Decreased coordination;Decreased knowledge of use of DME;Decreased knowledge of precautions;Cardiopulmonary status limiting activity;Impaired sensation PT Treatment Interventions: Functional mobility training;Therapeutic activities;Neuromuscular re-education;Balance training;Patient/family education;Other (comment) (pregait)     PT Goals(Current goals can be found in the care plan section) Acute Rehab PT Goals Patient Stated Goal: pt did no voice any goals, but is interested in maximizing function PT Goal Formulation: With patient Time For Goal Achievement: 08/13/13 Potential to Achieve Goals: Good  Visit Information  Last PT Received On: 07/30/13 Assistance Needed: +2 History of Present Illness: [name] is 53 y/o male with a history of severe CHF due to NICM EF 10-15%, and severe MR s/p [facility]. His medical history also consists of CRI, NSVT s/p ICD and LV thrombus. Admitted 1/24 with NSTEMI felt secondary due embolic phenomenon due to pump thrombosis.  LDH high around 900.  Extubated 1/30. Reintubated 2/2 due to inability control secretions. Trach 2/4 and on trach collar this am. Smiling and denies any pain. MAPs 80-90s on levophed gtt 2 mcg. No further AF on amio. Pending PEG today - heparin on hold. Starting to move L side  minimally.        Prior Functioning  Home Living Family/patient expects to be discharged to:: Unsure Living Arrangements: Other relatives Prior  Function Level of Independence: Independent with assistive device(s) Communication Communication: Expressive difficulties    Cognition  Cognition Arousal/Alertness: Awake/alert Behavior During Therapy: WFL for tasks assessed/performed Overall Cognitive Status: Within Functional Limits for tasks assessed    Extremity/Trunk Assessment Upper Extremity Assessment Upper Extremity Assessment: Defer to OT evaluation (generally flaccid left arm; assoc. reaction) Lower Extremity Assessment Lower Extremity Assessment: LLE deficits/detail;RLE deficits/detail RLE Deficits / Details: generally weak, but moves against gravity LLE Deficits / Details: gross flexion1/5, gross extension with external resistance 2+ to 3-/5 LLE Sensation: decreased light touch LLE Coordination: decreased gross motor   Balance Balance Overall balance assessment: Needs assistance Sitting-balance support: Single extremity supported;No upper extremity supported;Feet supported Sitting balance-Leahy Scale: Fair Sitting balance - Comments: initially poor rating at EOB until truck activated then fair:  total of approx. 15 min in between standing trials Postural control:  (begins to list in any direction as he fatigues) Standing balance support: No upper extremity supported;Single extremity supported;During functional activity Standing balance-Leahy Scale: Poor Standing balance comment: 3 standing trial of approx 1 min + time frame working on upright stance, w/shift and genral balanc/control on L LE.  End of Session PT - End of Session Equipment Utilized During Treatment: Oxygen;Other (comment) (via trach collar) Activity Tolerance: Patient tolerated treatment well;Patient limited by fatigue Patient left: in bed;with call bell/phone within reach;with nursing/sitter in room Nurse Communication: Mobility status;Need for lift equipment  GP     Maryuri Warnke, Eliseo Gum 07/30/2013, 4:09 PM 07/30/2013  Severn Bing,  PT [phone]  (pager)

## 2013-07-30 NOTE — Progress Notes (Signed)
OT Cancellation Note  Patient Details Name: [name] MRN: [id number] DOB: May 22, 1961   Cancelled Treatment:    Reason Eval/Treat Not Completed: Other (comment) Pt fatigued after PT session. Will assess later.  [employer] Laqueisha Catalina, OTR/L  [phone] 07/30/2013 07/30/2013, 6:12 PM

## 2013-07-30 NOTE — Progress Notes (Signed)
Stroke Team Progress Note  HISTORY [name] is a 53 y.o. male left-handed male who presented with sudden onset left-sided weakness and aphasia. He was admitted 07/17/2013 with chest pain. He has a history of congestive heart failure and has a left ventricular assist device in place. His INR was low at home - his pump got clotted (likely causing MI), and he loaded himself and became supratherapeutic 4.97 in hospital. He was given vitamin K yesterday 07/18/2013, his INR came down to 1.9. He was then  started on heparin. Around 7:40 AM 07/19/2013, the nurse helped him to get dressed and stated that he seemed sleepy, he was moving his left side well. The patient endorsed that his symptoms started after he woke up this morning, but woke up 07/19/2013 in his normal state. He appeared to understand well. Patient was not administerd TPA secondary to being on systemic heparin and INR 1.9  SUBJECTIVE Off ventilator on trach collar. Going for peg today  OBJECTIVE Most recent Vital Signs: Filed Vitals:   07/30/13 0500 07/30/13 0600 07/30/13 0741 07/30/13 0759  BP:      Pulse: 92 91 87   Temp:    98.7 F (37.1 C)  TempSrc:    Oral  Resp: 22 19 20    Height:      Weight:      SpO2: 100% 100% 100%    CBG (last 3)   Recent Labs  07/29/13 2327 07/30/13 0352 07/30/13 0801  GLUCAP 99 114* 134*    IV Fluid Intake:   . sodium chloride 20 mL/hr at 07/30/13 0700  . amiodarone (NEXTERONE PREMIX) 360 mg/200 mL dextrose 30 mg/hr (07/30/13 0700)  . dexmedetomidine Stopped (07/29/13 0700)  . nitroGLYCERIN Stopped (07/17/13 1800)  . norepinephrine (LEVOPHED) Adult infusion 2 mcg/min (07/30/13 0700)    MEDICATIONS  . allopurinol  100 mg Oral Daily  . antiseptic oral rinse  15 mL Mouth Rinse QID  . chlorhexidine  15 mL Mouth Rinse BID  . etomidate  40 mg Intravenous Once  . feeding supplement (PRO-STAT SUGAR FREE 64)  30 mL Per Tube TID  . feeding supplement (VITAL HIGH PROTEIN)  1,000 mL Per Tube Q24H   . insulin aspart  0-15 Units Subcutaneous Q4H  . magnesium oxide  400 mg Oral Daily  . pantoprazole (PROTONIX) IV  40 mg Intravenous Q24H  . sodium chloride  10-40 mL Intracatheter Q12H   PRN:  acetaminophen, fentaNYL, hydrALAZINE, midazolam, nitroGLYCERIN, ondansetron (ZOFRAN) IV  Diet:  NPO  Activity:  Bedrest DVT Prophylaxis:  IV heparin  CLINICALLY SIGNIFICANT STUDIES Basic Metabolic Panel:   Recent Labs Lab 07/25/13 0515 07/26/13 0030  07/29/13 0154 07/30/13 0500  NA 141 136*  < > 141 141  K 4.0 3.5*  < > 3.7 3.3*  CL 101 96  < > 106 104  CO2 28 26  < > 23 24  GLUCOSE 98 120*  < > 156* 127*  BUN 13 17  < > 17 14  CREATININE 1.13 1.15  < > 0.88 0.78  CALCIUM 8.9 9.0  < > 8.3* 8.2*  MG 2.2 2.0  --   --   --   PHOS 3.7 3.1  --   --   --   < > = values in this interval not displayed. Liver Function Tests:   Recent Labs Lab 07/28/13 0330  AST 27  ALT 16  ALKPHOS 63  BILITOT 0.9  PROT 6.8  ALBUMIN 2.6*   CBC:  Recent Labs Lab 07/29/13 0154 07/30/13 0500  WBC 6.3 9.0  HGB 8.5* 7.8*  HCT 26.0* 23.6*  MCV 89.7 90.8  PLT 319 398   Coagulation:   Recent Labs Lab 07/28/13 0330 07/29/13 0154 07/29/13 1830 07/30/13 0500  LABPROT 17.0* 19.4* 16.8* 16.4*  INR 1.42 1.69* 1.40 1.36   Cardiac Enzymes:  No results found for this basename: CKTOTAL, CKMB, CKMBINDEX, TROPONINI,  in the last 168 hours Urinalysis: No results found for this basename: COLORURINE, APPERANCEUR, LABSPEC, PHURINE, GLUCOSEU, HGBUR, BILIRUBINUR, KETONESUR, PROTEINUR, UROBILINOGEN, NITRITE, LEUKOCYTESUR,  in the last 168 hours Lipid Panel    Component Value Date/Time   CHOL 166 07/20/2013 0020   TRIG 96 07/20/2013 0020   HDL 63 07/20/2013 0020   CHOLHDL 2.6 07/20/2013 0020   VLDL 19 07/20/2013 0020   LDLCALC 84 07/20/2013 0020   HgbA1C  Lab Results  Component Value Date   HGBA1C 5.7* 07/19/2013    Urine Drug Screen:     Component Value Date/Time   LABOPIA NONE DETECTED 06/21/2008  1300   COCAINSCRNUR NONE DETECTED 06/21/2008 1300   LABBENZ NONE DETECTED 06/21/2008 1300   AMPHETMU NONE DETECTED 06/21/2008 1300   THCU NONE DETECTED 06/21/2008 1300   LABBARB  Value: NONE DETECTED        DRUG SCREEN FOR MEDICAL PURPOSES ONLY.  IF CONFIRMATION IS NEEDED FOR ANY PURPOSE, NOTIFY LAB WITHIN 5 DAYS. 06/21/2008 1300    Alcohol Level: No results found for this basename: ETH,  in the last 168 hours   CT of the brain   07/20/2013 1. New hypodensities in the left basal ganglia and left thalamus,  concerning for evolving acute infarcts. Smaller foci of hypoattenuation in the right caudate and thalamus may also represent acute ischemia.  2. New paranasal sinus mucosal disease. 07/19/2013    1. No acute intracranial process. 2. Very minimal amount of geographic subcortical encephalomalacia within in the anterior lateral aspect of the left temporal lobe, at the site of the patient's prior hemorrhagic contusion.   Cerebral Angiogram 07/20/2013 Rt CFa approach. Distal small filling defects in the the orbitofrontal br of RT MCA sup division,and tertiary angular division of RT MCA  MRI of the brain    2D Echocardiogram  EF 10%. Diffuse kypokinesis  TEE 07/21/2013 no evidence of clot but with RV failure.   CXR   07/28/2013 Tracheostomy tube with the tip 3 cm above the carina.   EKG  sinus tachycardia. For complete results please see formal report.   Therapy Recommendations   Physical Exam   Young african [place] male with tracheostomy on ventilator. . Afebrile. Head is nontraumatic. Neck is supple without bruit.. Cardiac exam no murmur or gallop. Lungs are clear to auscultation. Distal pulses are well felt. LVAD right side abdomen Neurological Exam :  Awake  and follows commands well. Pupils 3 mm sluggishly reactive. Doll's eye movements are sluggish.Eyes partially open and will blink to threat and follow gaze. No facial grimacing to pain. No spontaneous extremity movements.  L sided  hemiplegia but can move toes and fingers slightly, following commands on R side with antigravity movements.   ASSESSMENT [name] is a 53 y.o. male presenting with left sided weakness.  Imaging supports left basal ganglia and thalamic infarcts larger than right basal ganglia subcortical  infarcts. Infarcts cardioembolic secondary to potential sources of atrial fibrillation, LVAD clot and/or acute MI.  On aspirin 81 mg orally every day, clopidogrel 75 mg orally every day and warfarin  prior to admission. Now on no antiplatelets or anticoagulants for secondary stroke prevention. Patient with resultant left arm hemiplegia, left leg hemiparesis, dysphagia, VDRF.  Respiratory failure, extubated 07/25/2013 with profuse oral bleeding, reintubated same day to protect airway - IV heparin, aspirin and plavix on hold. Trach placement delayed secondary to elevated INR 2.09. FFP given prior to trach placement. Trach placxed 07/28/2013 by ENT without difficulty.   Systolic HF, EF 10%, has LVAD. On transplant list prior to admission. Given new stroke, no longer a transplant candidate.  new onset atrial fibrillation with RVR  NSTEMI - Elevated troponins - will manage medically hypertension Chronic RI Hyperlipidemia, LDL 84, on no statin PTA, now on no statin, at goal LDL < 100  obstructive sleep apnea Hx migraines Bipolar affective disorder TEE performed 07/21/2013. Ejection fraction 15-20%. No obvious clot was noted. INR 1.36  Hospital day # 13  TREATMENT/PLAN  Resume antiplatelets/anticoagulants when medically stable. If not placed on anticoagulant at time of discharge, will need documentation of contraindication  PEG placement hopefully today. Consider starting warfarin after peg if ok with radiology. Will need heparin bridge till INR is therepeautic  LTACH placement anticipated  OOB, therapy evals when stable  Will continue to follow  Delia Heady, MD

## 2013-07-30 NOTE — Progress Notes (Addendum)
Patient to IR today for PEG tube, however not placed d/t high risk. Discussed with Dr. Dwain Sarna about placement of Jtube, however concerns that too high risk with recent MI and stroke. Recommendations to continue with Panda at this time and go to [facility] for rehab and if later on when he is not as critical consider J tube placement. Will restart Coumadin. Discussed with Dr. Gala Romney.   [name] B NP-C 4:27 PM  Agree.   Case d/w with Dr. Lowella Dandy in Radiology and Dr. Jose Persia at [facility]. Concern about placing G-tube as tube would likely abut VAD and would be high risk for complications. Have recommended J-tube. We discussed this with Dr. Dwain Sarna in [facility] and he feels patient not stable enough for surgery. Recommends continuing Panda tube for now and can reconsider J-tube placement as more stable.   Brayten Komar,MD 5:14 PM

## 2013-07-31 LAB — CBC
HCT: 29.8 % — ABNORMAL LOW (ref 39.0–52.0)
Hemoglobin: 9.7 g/dL — ABNORMAL LOW (ref 13.0–17.0)
MCH: 29.8 pg (ref 26.0–34.0)
MCHC: 32.6 g/dL (ref 30.0–36.0)
MCV: 91.4 fL (ref 78.0–100.0)
PLATELETS: 386 10*3/uL (ref 150–400)
RBC: 3.26 MIL/uL — ABNORMAL LOW (ref 4.22–5.81)
RDW: 16.6 % — AB (ref 11.5–15.5)
WBC: 7.6 10*3/uL (ref 4.0–10.5)

## 2013-07-31 LAB — GLUCOSE, CAPILLARY
GLUCOSE-CAPILLARY: 100 mg/dL — AB (ref 70–99)
GLUCOSE-CAPILLARY: 113 mg/dL — AB (ref 70–99)
GLUCOSE-CAPILLARY: 84 mg/dL (ref 70–99)
Glucose-Capillary: 116 mg/dL — ABNORMAL HIGH (ref 70–99)
Glucose-Capillary: 115 mg/dL — ABNORMAL HIGH (ref 70–99)
Glucose-Capillary: 94 mg/dL (ref 70–99)

## 2013-07-31 LAB — TYPE AND SCREEN
ABO/RH(D): O POS
Antibody Screen: NEGATIVE
UNIT DIVISION: 0

## 2013-07-31 LAB — CLOSTRIDIUM DIFFICILE BY PCR: Toxigenic C. Difficile by PCR: NEGATIVE

## 2013-07-31 LAB — BASIC METABOLIC PANEL
BUN: 13 mg/dL (ref 6–23)
CALCIUM: 8.5 mg/dL (ref 8.4–10.5)
CHLORIDE: 109 meq/L (ref 96–112)
CO2: 24 mEq/L (ref 19–32)
CREATININE: 0.77 mg/dL (ref 0.50–1.35)
GFR calc Af Amer: >90 mL/min (ref >90)
GFR calc non Af Amer: >90 mL/min (ref >90)
GLUCOSE: 126 mg/dL — AB (ref 70–99)
Potassium: 3.5 mEq/L — ABNORMAL LOW (ref 3.7–5.3)
Sodium: 144 mEq/L (ref 137–147)

## 2013-07-31 LAB — LACTATE DEHYDROGENASE: LDH: 762 U/L — AB (ref 94–250)

## 2013-07-31 LAB — PROTIME-INR
INR: 1.3 (ref 0.00–1.49)
Prothrombin Time: 15.9 seconds — ABNORMAL HIGH (ref 11.6–15.2)

## 2013-07-31 LAB — CARBOXYHEMOGLOBIN
CARBOXYHEMOGLOBIN: 1.7 % — AB (ref 0.5–1.5)
Methemoglobin: 0.7 % (ref 0.0–1.5)
O2 Saturation: 63.1 %
TOTAL HEMOGLOBIN: 9.8 g/dL — AB (ref 13.5–18.0)

## 2013-07-31 LAB — HEPARIN LEVEL (UNFRACTIONATED): HEPARIN UNFRACTIONATED: 0.42 [IU]/mL (ref 0.30–0.70)

## 2013-07-31 MED ORDER — CLOPIDOGREL BISULFATE 75 MG PO TABS
75.0000 mg | ORAL_TABLET | Freq: Every day | ORAL | Status: DC
  Administered 2013-07-31 – 2013-08-11 (×12): 75 mg via ORAL
  Filled 2013-07-31 (×14): qty 1

## 2013-07-31 MED ORDER — CLOPIDOGREL BISULFATE 75 MG PO TABS: 75.0000 mg | ORAL_TABLET | Freq: Every day | ORAL | Status: DC

## 2013-07-31 MED ORDER — PANTOPRAZOLE SODIUM 40 MG PO PACK
40.0000 mg | PACK | Freq: Every day | ORAL | Status: DC
  Administered 2013-07-31 – 2013-08-11 (×12): 40 mg
  Filled 2013-07-31 (×13): qty 20

## 2013-07-31 MED ORDER — WARFARIN SODIUM 5 MG PO TABS
5.0000 mg | ORAL_TABLET | Freq: Once | ORAL | Status: AC
  Administered 2013-07-31: 5 mg via ORAL
  Filled 2013-07-31: qty 1

## 2013-07-31 MED ORDER — ASPIRIN 81 MG PO CHEW
81.0000 mg | CHEWABLE_TABLET | Freq: Every day | ORAL | Status: DC
  Administered 2013-07-31 – 2013-08-11 (×12): 81 mg via ORAL
  Filled 2013-07-31 (×12): qty 1

## 2013-07-31 MED ORDER — GLYCOPYRROLATE 1 MG PO TABS
1.0000 mg | ORAL_TABLET | Freq: Two times a day (BID) | ORAL | Status: DC
  Administered 2013-07-31 – 2013-08-11 (×23): 1 mg
  Filled 2013-07-31 (×28): qty 1

## 2013-07-31 NOTE — Progress Notes (Signed)
Pulmonary/Critical Care Progress Note   Name: Eric LovelyUndra Glas MRN: [id number] DOB: 10/07/1960    ADMISSION DATE:  07/17/2013 CONSULTATION DATE:  1/26  REFERRING MD :  [name] PRIMARY SERVICE: Benshimon  CHIEF COMPLAINT:  Vent weaning and hypotension  BRIEF PATIENT DESCRIPTION:  This is a 53 year old male w/ known h/o NICM EF 10-15% w/ severe MR. He is s/p [facility], as bridge to transplant. Admitted for evaluation of new NSTEMI felt possibly to be related to pump thrombosis. Hosp course complicated by episodes of CP and hypotension on 1/24 pm hours. 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 PCCM asked to see post-op for vent weaning.    SIGNIFICANT EVENTS / STUDIES:  1/24: admitted w/ CP and trop rise. Did have sub-therapeutic INR at home so concern was for pump thrombosis.  1/24: evening had CP, rising Trop I, got mso4 and NTG>>>got hypotensive requiring pressors.  1/25 INR supra-therapeutic. Coumadin stopped, heparin started 1/26: CT head: negative for bleed.  1/26:cerebral arteriogram:  acute right MCA embolic CVA. Not amendable to extraction due to distal location.  1/30 Extubated but airway protection is questionable at best 2/01 Reintubated 2/3 ENT eval Jearld Fenton(Byers) clots suctioned from mouth - no active bleeding site  LINES / TUBES: LVAD OETT 1/26>>>1/30, 2/1 >>2/4 Trach 2/4 >> Right PICC 1/25>>>   CULTURES: Resp 1/27 >> few GNR >> normal flora  ANTIBIOTICS: 1/27 zosyn (aspiration, GNR) >> 2/5  SUBJECTIVE:  Tolerates ATC   afebrile No sig bleeding on heparin  VITAL SIGNS: Temp:  [98.1 F (36.7 C)-98.8 F (37.1 C)] 98.1 F (36.7 C) (02/07 0400) Pulse Rate:  [85-99] 92 (02/07 0700) Resp:  [14-32] 16 (02/07 0700) BP: (86-107)/(71-84) 86/71 mmHg (02/07 0350) SpO2:  [97 %-100 %] 99 % (02/07 0700) Arterial Line BP: (85-112)/(71-91) 89/74 mmHg (02/07 0700) FiO2 (%):  [28 %-35 %] 28 % (02/07 0350) Weight:   [87.9 kg (193 lb 12.6 oz)] 87.9 kg (193 lb 12.6 oz) (02/07 0600) HEMODYNAMICS: CVP:  [13 mmHg-15 mmHg] 15 mmHg VENTILATOR SETTINGS: Vent Mode:  [-]  FiO2 (%):  [28 %-35 %] 28 % INTAKE / OUTPUT: Intake/Output     02/06 0701 - 02/07 0700 02/07 0701 - 02/08 0700   I.V. (mL/kg) 659 (7.5)    Blood 212.5    NG/GT 660    IV Piggyback 50    Total Intake(mL/kg) 1581.5 (18)    Urine (mL/kg/hr) 525 (0.2)    Total Output 525     Net +1056.5          Stool Occurrence 1 x     PHYSICAL EXAMINATION: General:  No distress Neuro: interactive, LUE weakness 3/5 HEENT: trach in place, bloody secretions from oral cavity Cardiovascular:  LVAD pump hum. No edema  Lungs:  Coarse BS, norhonchi  Abdomen:  Soft, nontender  Musculoskeletal:  Intact  Skin:  Intact   LABS:  CBC  Recent Labs Lab 07/29/13 0154 07/30/13 0500 07/31/13 0500  WBC 6.3 9.0 7.6  HGB 8.5* 7.8* 9.7*  HCT 26.0* 23.6* 29.8*  PLT 319 398 386   Coag's  Recent Labs Lab 07/27/13 2110  07/29/13 1830 07/30/13 0500 07/31/13 0500  APTT 39*  --   --   --   --   INR 1.52*  < > 1.40 1.36 1.30  < > = values in this interval not displayed. BMET  Recent Labs Lab 07/29/13 0154 07/30/13 0500 07/31/13 0500  NA 141  141 144  K 3.7 3.3* 3.5*  CL 106 104 109  CO2 23 24 24   BUN 17 14 13   CREATININE 0.88 0.78 0.77  GLUCOSE 156* 127* 126*   Electrolytes  Recent Labs Lab 07/25/13 0515 07/26/13 0030  07/29/13 0154 07/30/13 0500 07/31/13 0500  CALCIUM 8.9 9.0  < > 8.3* 8.2* 8.5  MG 2.2 2.0  --   --   --   --   PHOS 3.7 3.1  --   --   --   --   < > = values in this interval not displayed. Sepsis Markers No results found for this basename: LATICACIDVEN, PROCALCITON, O2SATVEN,  in the last 168 hours ABG  Recent Labs Lab 07/25/13 2007  PHART 7.575*  PCO2ART 33.9*  PO2ART 384.0*   Glucose  Recent Labs Lab 07/30/13 0801 07/30/13 1230 07/30/13 1616 07/30/13 2007 07/31/13 0038 07/31/13 0435  GLUCAP 134* 107*  102* 89 100* 116*    Imaging Ir Vangie Bicker G Tube Plc W/fl W/rad  07/30/2013   CLINICAL DATA:  53 year old with CHF and systolic heart failure. Patient has a left ventricular assist device and recent embolic event due to pump thrombus. The patient will need long-term tube feeding. Patient is being evaluated for percutaneous gastrostomy tube.  EXAM: PLACEMENT OF POST PYLORIC FEEDING TUBE WITH FLUOROSCOPY  Physician: Rachelle Hora. Henn, MD  MEDICATIONS: None  ANESTHESIA/SEDATION: Moderate sedation time: None  FLUOROSCOPY TIME:  2 min and 30 seconds  PROCEDURE: The patient was placed on the interventional table. Images of the abdomen were obtained. The stomach was distended with gas through the nasogastric tube. A small percutaneous window was identified between the left ventricular assist device and the transverse colon. After discussion with Dr.Bensimhon and Dr. Donata Clay, we felt that the gastrostomy tube placement would be too close to the LVAD and did not want to take the risk of a possible complication or infection. As result, the gastrostomy procedure was not performed.  Attention was directed to placing a post pyloric feeding tube. The existing nasogastric tube was removed over a stiff Glidewire. A weighted feeding tube was advanced over the wire to the pyloric region. The tube was easily advanced into the duodenum and the tube was positioned near the ligament of Treitz. Contrast injection confirmed placement in the small bowel.  COMPLICATIONS: None  FINDINGS: The transverse colon is located high in the abdomen and there was a very small percutaneous window between the left ventricular assist device and the transverse colon. As result, the gastrostomy tube was not performed. A post pyloric feeding tube was placed. The feeding tube tip is near the ligament of Treitz.  IMPRESSION: Percutaneous gastrostomy tube was not performed. A post pyloric feeding tube was placed without complication.   Electronically Signed   By:  Richarda Overlie M.D.   On: 07/30/2013 15:05   Dg Abd Portable 1v  07/30/2013   CLINICAL DATA:  Evaluation for gastrostomy tube placement.  EXAM: PORTABLE ABDOMEN - 1 VIEW  COMPARISON:  07/28/2013  FINDINGS: Barium is present throughout the nondistended colon. Left ventricular assist device is seen overlying the left upper quadrant. NG tube is in place.  No dilated bowel.  IMPRESSION: Barium is present throughout the colon.   Electronically Signed   By: Geanie Cooley M.D.   On: 07/30/2013 08:03    ASSESSMENT / PLAN:  PULMONARY A: Acute respiratory failure 2nd to Rt MCA-CVA and difficulty with airway secretions.  Re-intubated 2/01. Bloody oral secretions ?from traumatic  intubation 2/01 -no active bleed on ENT exam/directlaryngoscopy P:   - ATC as tolerated -PM valve   CARDIOVASCULAR A:  NSTEMI (felt to be Pump thrombosis) Acute on Chronic systolic HF (EF 10-15%) w/ LVAD placed in march 2013 at [facility].  Cardiogenic shock  P - LVAD per cards, co-ox improving - Levophed for MAP >65 - Asa, plavix per cards  -heparin resumed 2/4  RENAL A:   AKI in setting of hypoperfusion, s/p contrast load for cerebral arteriogram >> improved. P:   -F/u chemistry  -replete K   GASTROINTESTINAL A:   Nutrition -high risk for PEG or J-tube. P:   -PPI for SUP -resumed tube feeds with panda -bowel regimen -can assess swallow next week  HEMATOLOGIC A:   Chronic anticoagulation (on coumadin for LVAD) Concern about acute pump related embolic events P:  - Heparin per pharmacy  -coumadin   INFECTIOUS A:   ? aspiration pneumonia P:   -observe off abx  ENDOCRINE A:   Mild hyperglycemia  P:   -SSI if glucose > 150.  NEUROLOGIC A:   Right MCA embolic CVA. Not amendable to IR  P:   -per neuro - prn fent -aggressive PT/OT  Needs aggressive rehab now Eventually to [facility], May have to resolve feeding tube issue prior    [facility] V.  [phone]   07/31/2013, 8:28 AM

## 2013-07-31 NOTE — Progress Notes (Signed)
Patient ID: [name], male   DOB: 06/24/1961, 53 y.o.   MRN: [id number] Advanced Heart Failure Rounding Note  [name] is 53 y/o male with a history of severe CHF due to NICM EF 10-15%, and severe MR s/p HM II LVAD placed at [facility] March 2013. His medical history also consists of CRI, NSVT s/p ICD and LV thrombus.   Admitted 1/24 with NSTEMI felt secondary due embolic phenomenon due to pump thrombosis.  LDH high around 900.  On way to cath lab 1/26 developed acute onset of aphasia and left-sided hemiparesis. CT negative for bleed. Taken emergently to neuro-interventional suite but no amenable lesions. Remains intubated on levophed. Remains on propofol gtt, unable to assess neuro status.  S/P TEE 1/28. No evidence of clot but with RV failure.   Extubated 1/30. Reintubated 2/2 due to inability control secretions.   Trach placed due to difficulty with secretions and on trach collar this am. Denies any pain. No epistaxis overnight.  MAPs 80s-90s, now off norepinephrine. On heparin.  Transfused 1 unit yesterday.   Hgb 11.6> 9.8>9.0> 8.5>9.7  LDH:  976>1568>1165>1124>937>870>762 Co-ox: 62%>57%>63%>63% INR 1.4>1.69  VAD interrogated personally.  Flow 4.8 , Speed 9200 PI 7.1,  Power 5.4   Events: several PI events this morning around time of suctioning and trach care.    Filed Vitals:   07/31/13 0600 07/31/13 0700 07/31/13 0836 07/31/13 0905  BP:    93/75  Pulse: 85 92  94  Temp:   98.3 F (36.8 C)   TempSrc:   Oral   Resp: 16 16  17   Height:      Weight: 87.9 kg (193 lb 12.6 oz)     SpO2: 100% 99%  100%    Intake/Output Summary (Last 24 hours) at 07/31/13 1136 Last data filed at 07/31/13 1100  Gross per 24 hour  Intake 1891.5 ml  Output    450 ml  Net 1441.5 ml   Scheduled Meds: . allopurinol  100 mg Oral Daily  . antiseptic oral rinse  15 mL Mouth Rinse QID  . aspirin  81 mg Oral Daily  . chlorhexidine  15 mL Mouth Rinse BID  . [START ON 08/01/2013] clopidogrel  75 mg Oral Q  breakfast  . etomidate  40 mg Intravenous Once  . feeding supplement (PRO-STAT SUGAR FREE 64)  30 mL Per Tube TID  . feeding supplement (VITAL HIGH PROTEIN)  1,000 mL Per Tube Q24H  . glycopyrrolate  1 mg Per Tube BID  . insulin aspart  0-15 Units Subcutaneous Q4H  . magnesium oxide  400 mg Oral Daily  . pantoprazole sodium  40 mg Per Tube Q1200  . sodium chloride  10-40 mL Intracatheter Q12H  . warfarin  5 mg Oral ONCE-1800  . Warfarin - Pharmacist Dosing Inpatient   Does not apply q1800   Continuous Infusions: . sodium chloride 20 mL/hr at 07/31/13 1100  . heparin 1,200 Units/hr (07/31/13 1100)  . nitroGLYCERIN Stopped (07/17/13 1800)  . norepinephrine (LEVOPHED) Adult infusion Stopped (07/30/13 1700)   PRN Meds:.acetaminophen, fentaNYL, hydrALAZINE, nitroGLYCERIN, ondansetron (ZOFRAN) IV  LABS: Basic Metabolic Panel:  Recent Labs  40/98/11 0500 07/31/13 0500  NA 141 144  K 3.3* 3.5*  CL 104 109  CO2 24 24  GLUCOSE 127* 126*  BUN 14 13  CREATININE 0.78 0.77  CALCIUM 8.2* 8.5   Liver Function Tests: No results found for this basename: AST, ALT, ALKPHOS, BILITOT, PROT, ALBUMIN,  in the last 72 hours No results  found for this basename: LIPASE, AMYLASE,  in the last 72 hours CBC:  Recent Labs  07/30/13 0500 07/31/13 0500  WBC 9.0 7.6  HGB 7.8* 9.7*  HCT 23.6* 29.8*  MCV 90.8 91.4  PLT 398 386   Cardiac Enzymes: No results found for this basename: CKTOTAL, CKMB, CKMBINDEX, TROPONINI,  in the last 72 hours RADIOLOGY: Dg Chest Portable 1 View  07/17/2013   CLINICAL DATA:  Left-sided chest pain for 3 days, weakness and dizziness.  EXAM: PORTABLE CHEST - 1 VIEW  COMPARISON:  08/03/2012  FINDINGS: Evidence of median sternotomy noted with LVAD in place. Left-sided defibrillator noted. Mild prominence of the cardiac silhouette persists. Tricuspid valvuloplasty reidentified. Trace left pleural fluid or thickening persists. No new pulmonary opacity.  IMPRESSION: No new  acute abnormality. Stable appearance of trace left pleural fluid or thickening.   Electronically Signed   By: Christiana PellantGretchen  Green M.D.   On: 07/17/2013 10:12    PHYSICAL EXAM General: NAD; on trach collar;  Neck: JVP 9; trach Lungs: Diffuse rhonchi CV: LVAD pump hum normal.  Trace edema.   Abdomen: Soft, nontender, no hepatosplenomegaly, no distention. Hypoactive BS Neurologic: awake interactive. Expressive aphasia. Moving left foot, slight movement left hand.   Extremities: No clubbing or cyanosis.   TELEMETRY: NSR 80s   Assessment:   1. Acute embolic CVA 2. NSTEMI - Likely pump thrombosis with coronary embolus.  2. Chronic systolic HF s/p HM II VAD  3. NICM EF 15%  4. Hyperkalemia  5. HTN  6. Acute respiratory failure 7. Shock - suspect mixed picture RV failure/vasodilatory 8. Oral bleeding  Plan/Discussion:    He has had NSTEMI and acute CVA due to pump thrombosis/embolism. Now with expressive aphasia and dense L hemiplegia. They have suggested that speech and ability to control secretions will return in several months but L-sided plegia may not recover.  He has some left foot and left hand movement. I am going to restart ASA 81 today and Plavix without loading dose.  He will continue heparin bridge to therapeutic INR.   Stable. On trach collar this am with clear secretions, no epistaxis now. Remains on heparin. LDH trending down, creatinine stable.  Given copious secretions, adding glycopyrrolate.    Deemed too high risk currently for PEG or J-tube.  To keep panda tube for now.   CVP 13 this am. MAPs and flows very labile likely d/t RV failure.  Currently off norepinephrine. Aiming to keep CVP 10-15 with RV failure. He had 5-6 PI events this morning around the time of suctioning/trach care.   NSR off amiodarone.   OOB to chair today and continue to work with PT/OT and Speech.   He will eventually need placement at [facility].  The patient is critically ill with multiple organ  systems failure and requires high complexity decision making for assessment and support, frequent evaluation and titration of therapies, application of advanced monitoring technologies and extensive interpretation of multiple databases.   I reviewed the LVAD parameters from today, and compared the results to the patient's prior recorded data. No programming changes were made. The LVAD is functioning within specified parameters. The patient performs LVAD self-test daily. LVAD interrogation was negative for any significant power changes, alarms or PI events/speed drops. LVAD equipment check completed and is in good working order. Back-up equipment present. LVAD education done on emergency procedures and precautions and reviewed exit site care.  Elery Cadenhead,MD 11:36 AM

## 2013-07-31 NOTE — Progress Notes (Signed)
Stroke Team Progress Note  HISTORY [name] is a 53 y.o. male left-handed male who presented with sudden onset left-sided weakness and aphasia. He was admitted 07/17/2013 with chest pain. He has a history of congestive heart failure and has a left ventricular assist device in place. His INR was low at home - his pump got clotted (likely causing MI), and he loaded himself and became supratherapeutic 4.97 in hospital. He was given vitamin K yesterday 07/18/2013, his INR came down to 1.9. He was then  started on heparin. Around 7:40 AM 07/19/2013, the nurse helped him to get dressed and stated that he seemed sleepy, he was moving his left side well. The patient endorsed that his symptoms started after he woke up this morning, but woke up 07/19/2013 in his normal state. He appeared to understand well. Patient was not administerd TPA secondary to being on systemic heparin and INR 1.9  SUBJECTIVE Off ventilator on trach collar. Did not get PEG tube  OBJECTIVE Most recent Vital Signs: Filed Vitals:   07/31/13 0600 07/31/13 0700 07/31/13 0836 07/31/13 0905  BP:    93/75  Pulse: 85 92  94  Temp:   98.3 F (36.8 C)   TempSrc:   Oral   Resp: 16 16  17   Height:      Weight: 87.9 kg (193 lb 12.6 oz)     SpO2: 100% 99%  100%   CBG (last 3)   Recent Labs  07/30/13 2007 07/31/13 0038 07/31/13 0435  GLUCAP 89 100* 116*    IV Fluid Intake:   . sodium chloride 20 mL/hr at 07/31/13 0700  . heparin 1,200 Units/hr (07/31/13 0700)  . nitroGLYCERIN Stopped (07/17/13 1800)  . norepinephrine (LEVOPHED) Adult infusion Stopped (07/30/13 1700)    MEDICATIONS  . allopurinol  100 mg Oral Daily  . antiseptic oral rinse  15 mL Mouth Rinse QID  . chlorhexidine  15 mL Mouth Rinse BID  . etomidate  40 mg Intravenous Once  . feeding supplement (PRO-STAT SUGAR FREE 64)  30 mL Per Tube TID  . feeding supplement (VITAL HIGH PROTEIN)  1,000 mL Per Tube Q24H  . insulin aspart  0-15 Units Subcutaneous Q4H  .  magnesium oxide  400 mg Oral Daily  . pantoprazole sodium  40 mg Per Tube Q1200  . sodium chloride  10-40 mL Intracatheter Q12H  . warfarin  5 mg Oral ONCE-1800  . Warfarin - Pharmacist Dosing Inpatient   Does not apply q1800   PRN:  acetaminophen, fentaNYL, hydrALAZINE, nitroGLYCERIN, ondansetron (ZOFRAN) IV  Diet:  NPO  Activity:  Bedrest DVT Prophylaxis:  IV heparin  CLINICALLY SIGNIFICANT STUDIES Basic Metabolic Panel:   Recent Labs Lab 07/25/13 0515 07/26/13 0030  07/30/13 0500 07/31/13 0500  NA 141 136*  < > 141 144  K 4.0 3.5*  < > 3.3* 3.5*  CL 101 96  < > 104 109  CO2 28 26  < > 24 24  GLUCOSE 98 120*  < > 127* 126*  BUN 13 17  < > 14 13  CREATININE 1.13 1.15  < > 0.78 0.77  CALCIUM 8.9 9.0  < > 8.2* 8.5  MG 2.2 2.0  --   --   --   PHOS 3.7 3.1  --   --   --   < > = values in this interval not displayed. Liver Function Tests:   Recent Labs Lab 07/28/13 0330  AST 27  ALT 16  ALKPHOS 63  BILITOT 0.9  PROT 6.8  ALBUMIN 2.6*   CBC:   Recent Labs Lab 07/30/13 0500 07/31/13 0500  WBC 9.0 7.6  HGB 7.8* 9.7*  HCT 23.6* 29.8*  MCV 90.8 91.4  PLT 398 386   Coagulation:   Recent Labs Lab 07/29/13 0154 07/29/13 1830 07/30/13 0500 07/31/13 0500  LABPROT 19.4* 16.8* 16.4* 15.9*  INR 1.69* 1.40 1.36 1.30   Cardiac Enzymes:  No results found for this basename: CKTOTAL, CKMB, CKMBINDEX, TROPONINI,  in the last 168 hours Urinalysis: No results found for this basename: COLORURINE, APPERANCEUR, LABSPEC, PHURINE, GLUCOSEU, HGBUR, BILIRUBINUR, KETONESUR, PROTEINUR, UROBILINOGEN, NITRITE, LEUKOCYTESUR,  in the last 168 hours Lipid Panel    Component Value Date/Time   CHOL 166 07/20/2013 0020   TRIG 96 07/20/2013 0020   HDL 63 07/20/2013 0020   CHOLHDL 2.6 07/20/2013 0020   VLDL 19 07/20/2013 0020   LDLCALC 84 07/20/2013 0020   HgbA1C  Lab Results  Component Value Date   HGBA1C 5.7* 07/19/2013    Urine Drug Screen:     Component Value Date/Time    LABOPIA NONE DETECTED 06/21/2008 1300   COCAINSCRNUR NONE DETECTED 06/21/2008 1300   LABBENZ NONE DETECTED 06/21/2008 1300   AMPHETMU NONE DETECTED 06/21/2008 1300   THCU NONE DETECTED 06/21/2008 1300   LABBARB  Value: NONE DETECTED        DRUG SCREEN FOR MEDICAL PURPOSES ONLY.  IF CONFIRMATION IS NEEDED FOR ANY PURPOSE, NOTIFY LAB WITHIN 5 DAYS. 06/21/2008 1300    Alcohol Level: No results found for this basename: ETH,  in the last 168 hours   CT of the brain   07/20/2013 1. New hypodensities in the left basal ganglia and left thalamus,  concerning for evolving acute infarcts. Smaller foci of hypoattenuation in the right caudate and thalamus may also represent acute ischemia.  2. New paranasal sinus mucosal disease. 07/19/2013    1. No acute intracranial process. 2. Very minimal amount of geographic subcortical encephalomalacia within in the anterior lateral aspect of the left temporal lobe, at the site of the patient's prior hemorrhagic contusion.   Cerebral Angiogram 07/20/2013 Rt CFa approach. Distal small filling defects in the the orbitofrontal br of RT MCA sup division,and tertiary angular division of RT MCA  MRI of the brain    2D Echocardiogram  EF 10%. Diffuse kypokinesis  TEE 07/21/2013 no evidence of clot but with RV failure.   CXR   07/28/2013 Tracheostomy tube with the tip 3 cm above the carina.   EKG  sinus tachycardia. For complete results please see formal report.   Therapy Recommendations   Physical Exam   Young african [place] male with tracheostomy on ventilator. . Afebrile. Head is nontraumatic. Neck is supple without bruit.. Cardiac exam no murmur or gallop. Lungs are clear to auscultation. Distal pulses are well felt. LVAD right side abdomen Neurological Exam :  Awake  and follows commands well. Pupils 3 mm sluggishly reactive. Doll's eye movements are sluggish.Eyes partially open and will blink to threat and follow gaze. No facial grimacing to pain. No spontaneous  extremity movements.  L sided hemiplegia but can move toes and fingers slightly, following commands on R side with antigravity movements.   ASSESSMENT [name] is a 53 y.o. male presenting with left sided weakness.  Imaging supports left basal ganglia and thalamic infarcts larger than right basal ganglia subcortical  infarcts. Infarcts cardioembolic secondary to potential sources of atrial fibrillation, LVAD clot and/or acute MI.  On aspirin  81 mg orally every day, clopidogrel 75 mg orally every day and warfarin prior to admission. Now on no antiplatelets or anticoagulants for secondary stroke prevention. Patient with resultant left arm hemiplegia, left leg hemiparesis, dysphagia, VDRF.  Respiratory failure, extubated 07/25/2013 with profuse oral bleeding, reintubated same day to protect airway - IV heparin, aspirin and plavix on hold. Trach placement delayed secondary to elevated INR 2.09. FFP given prior to trach placement. Trach placxed 07/28/2013 by ENT without difficulty.   Systolic HF, EF 10%, has LVAD. On transplant list prior to admission. Given new stroke, no longer a transplant candidate.  new onset atrial fibrillation with RVR  NSTEMI - Elevated troponins - will manage medically hypertension Chronic RI Hyperlipidemia, LDL 84, on no statin PTA, now on no statin, at goal LDL < 100  obstructive sleep apnea Hx migraines Bipolar affective disorder TEE performed 07/21/2013. Ejection fraction 15-20%. No obvious clot was noted. INR 1.30  Hospital day # 14  TREATMENT/PLAN  Resume antiplatelets/anticoagulants when medically stable- pt started on heparin gtt with bridging for coumadin  PEG placement attempted but pt was believed to be high risk due to current medical co morbidities. Recommended to continue PANDA.    LTACH placement anticipated  OOB, therapy evals when stable  Daneil Beem

## 2013-07-31 NOTE — Progress Notes (Signed)
ANTICOAGULATION CONSULT NOTE - Follow Up Consult  Pharmacy Consult for Heparin, resume Coumadin Indication: LVAD thrombosis/CVA    Allergies  Allergen Reactions  . Ace Inhibitors Cough  . Lexapro [Escitalopram Oxalate] Other (See Comments)    somnolence    Patient Measurements: Height: 5\' 4"  (162.6 cm) Weight: 193 lb 12.6 oz (87.9 kg) IBW/kg (Calculated) : 59.2 Heparin Dosing Weight: 77kg  Vital Signs: Temp: 98.1 F (36.7 C) (02/07 0400) Temp src: Oral (02/07 0400) BP: 86/71 mmHg (02/07 0350) Pulse Rate: 92 (02/07 0700)  Labs:  Recent Labs  07/29/13 0154 07/29/13 1830 07/30/13 0500 07/30/13 2100 07/31/13 0500  HGB 8.5*  --  7.8*  --  9.7*  HCT 26.0*  --  23.6*  --  29.8*  PLT 319  --  398  --  386  LABPROT 19.4* 16.8* 16.4*  --  15.9*  INR 1.69* 1.40 1.36  --  1.30  HEPARINUNFRC 0.45  --  0.20* 0.30 0.42  CREATININE 0.88  --  0.78  --  0.77    Estimated Creatinine Clearance: 110.5 ml/min (by C-G formula based on Cr of 0.77).  . sodium chloride 20 mL/hr at 07/31/13 0700  . heparin 1,200 Units/hr (07/31/13 0700)  . nitroGLYCERIN Stopped (07/17/13 1800)  . norepinephrine (LEVOPHED) Adult infusion Stopped (07/30/13 1700)    Assessment: 52yom with LVAD, suspected pump thrombosis and acute CVA continues on heparin. 2nd therapeutic heparin level this AM 0.42 at 1200 units/hr. H/H up to 9.7/29.8, Plts 386. Epistaxis much improved, some slight bloody secretions d/t irritation from trach but no significant bleeding reported. Panda continued, deferred J-tube at this time, Coumadin resumed last night  INR 02/07 1.30 PTA home dose: 5mg  daily except 6mg  on Fridays  Goal of Therapy:  Heparin level 0.3-0.5 units/ml Monitor platelets by anticoagulation protocol: Yes INR 2.0-3.0   Plan:  Continue heparin drip 1200 units/hr (12 ml/hr) Will continue to monitor bleeding Coumadin 5mg  x 1 tonight  Daily heparin level, CBC, and INR  Adryanna Friedt B. Artelia Laroche, PharmD Clinical  Pharmacist - Resident Phone: [phone] Pager: [phone] 07/31/2013 7:42 AM

## 2013-07-31 NOTE — Progress Notes (Signed)
[facility] ADULT ICU REPLACEMENT PROTOCOL FOR AM LAB REPLACEMENT ONLY  The patient does not apply for the [facility] Adult ICU Electrolyte Replacment Protocol based on the criteria listed below:   1. Is GFR >/= 40 ml/min? yes  Patient's GFR today is  2. Is urine output >/= 0.5 ml/kg/hr for the last 6 hours? yes Patient's UOP is  Urine occurences 3. Is BUN < 60 mg/dL?  Patient's BUN today is  4. Abnormal electrolyte(s):K 3.5 5. Ordered repletion with: NA 6. If a panic level lab has been reported, has the CCM MD in charge been notified? yes.   Physician:  Dr Molli Knock. He will defer to rounding MD  [facility], [name] A 07/31/2013 5:55 AM

## 2013-08-01 DIAGNOSIS — I219 Acute myocardial infarction, unspecified: Secondary | ICD-10-CM

## 2013-08-01 LAB — GLUCOSE, CAPILLARY
GLUCOSE-CAPILLARY: 110 mg/dL — AB (ref 70–99)
GLUCOSE-CAPILLARY: 94 mg/dL (ref 70–99)
Glucose-Capillary: 96 mg/dL (ref 70–99)
Glucose-Capillary: 105 mg/dL — ABNORMAL HIGH (ref 70–99)

## 2013-08-01 LAB — CBC
HCT: 28.7 % — ABNORMAL LOW (ref 39.0–52.0)
HEMOGLOBIN: 9.3 g/dL — AB (ref 13.0–17.0)
MCH: 29.9 pg (ref 26.0–34.0)
MCHC: 32.4 g/dL (ref 30.0–36.0)
MCV: 92.3 fL (ref 78.0–100.0)
PLATELETS: 422 10*3/uL — AB (ref 150–400)
RBC: 3.11 MIL/uL — ABNORMAL LOW (ref 4.22–5.81)
RDW: 16.9 % — ABNORMAL HIGH (ref 11.5–15.5)
WBC: 10.7 10*3/uL — ABNORMAL HIGH (ref 4.0–10.5)

## 2013-08-01 LAB — LACTATE DEHYDROGENASE: LDH: 787 U/L — AB (ref 94–250)

## 2013-08-01 LAB — PROTIME-INR
INR: 1.3 (ref 0.00–1.49)
PROTHROMBIN TIME: 15.9 s — AB (ref 11.6–15.2)

## 2013-08-01 LAB — BASIC METABOLIC PANEL
BUN: 16 mg/dL (ref 6–23)
CALCIUM: 8.6 mg/dL (ref 8.4–10.5)
CO2: 24 mEq/L (ref 19–32)
Chloride: 107 mEq/L (ref 96–112)
Creatinine, Ser: 0.88 mg/dL (ref 0.50–1.35)
GFR calc Af Amer: >90 mL/min (ref >90)
Glucose, Bld: 124 mg/dL — ABNORMAL HIGH (ref 70–99)
POTASSIUM: 3.4 meq/L — AB (ref 3.7–5.3)
Sodium: 143 mEq/L (ref 137–147)

## 2013-08-01 LAB — CARBOXYHEMOGLOBIN
Carboxyhemoglobin: 2 % — ABNORMAL HIGH (ref 0.5–1.5)
Methemoglobin: 0.8 % (ref 0.0–1.5)
O2 SAT: 70.1 %
Total hemoglobin: 9.5 g/dL — ABNORMAL LOW (ref 13.5–18.0)

## 2013-08-01 LAB — HEPARIN LEVEL (UNFRACTIONATED): Heparin Unfractionated: 0.36 IU/mL (ref 0.30–0.70)

## 2013-08-01 MED ORDER — POTASSIUM CHLORIDE 20 MEQ/15ML (10%) PO LIQD
40.0000 meq | Freq: Once | ORAL | Status: AC
  Administered 2013-08-01: 40 meq
  Filled 2013-08-01: qty 30

## 2013-08-01 MED ORDER — FUROSEMIDE 40 MG PO TABS
40.0000 mg | ORAL_TABLET | Freq: Once | ORAL | Status: AC
  Administered 2013-08-01: 40 mg via ORAL
  Filled 2013-08-01: qty 1

## 2013-08-01 MED ORDER — LOPERAMIDE HCL 1 MG/5ML PO LIQD
2.0000 mg | Freq: Two times a day (BID) | ORAL | Status: DC | PRN
  Administered 2013-08-09 – 2013-08-11 (×2): 2 mg
  Filled 2013-08-01 (×2): qty 10

## 2013-08-01 MED ORDER — WARFARIN SODIUM 10 MG PO TABS
10.0000 mg | ORAL_TABLET | Freq: Once | ORAL | Status: AC
  Administered 2013-08-01: 10 mg via ORAL
  Filled 2013-08-01: qty 1

## 2013-08-01 NOTE — Progress Notes (Signed)
Patient ID: [name], male   DOB: Sep 02, 1960, 53 y.o.   MRN: [id number] Advanced Heart Failure Rounding Note  [name] is 53 y/o male with a history of severe CHF due to NICM EF 10-15%, and severe MR s/p HM II LVAD placed at [facility] March 2013. His medical history also consists of CRI, NSVT s/p ICD and LV thrombus.   Admitted 1/24 with NSTEMI felt secondary due embolic phenomenon due to pump thrombosis.  LDH high around 900.  On way to cath lab 1/26 developed acute onset of aphasia and left-sided hemiparesis. CT negative for bleed. Taken emergently to neuro-interventional suite but no amenable lesions. Remains intubated on levophed. Remains on propofol gtt, unable to assess neuro status.  S/P TEE 1/28. No evidence of clot but with RV failure.   Extubated 1/30. Reintubated 2/2 due to inability control secretions.   Trach placed due to difficulty with secretions and on trach collar this am. Denies any pain. No epistaxis.  MAPs 80s-90s, now off norepinephrine. On heparin.  CVP up to 17 this morning.  In chair this morning.   Hgb 11.6> 9.8>9.0> 8.5>9.7>9.3  LDH:  639 151 9864 Co-ox: 62%>57%>63%>63%>70% INR 1.4>1.69>1.3  VAD interrogated personally.  Flow 5.6, Speed 9200 PI 6.8,  Power 5.7   Events: 2 PI events early am with suctioning, 3 PI events later this morning when he got up to chair.    Filed Vitals:   08/01/13 0800 08/01/13 1000 08/01/13 1129 08/01/13 1142  BP:  103/38    Pulse: 92 97 110   Temp:    98.4 F (36.9 C)  TempSrc:    Oral  Resp: 18 18 21    Height:      Weight:      SpO2: 98% 98% 99%     Intake/Output Summary (Last 24 hours) at 08/01/13 1212 Last data filed at 08/01/13 1100  Gross per 24 hour  Intake   2088 ml  Output    655 ml  Net   1433 ml   Scheduled Meds: . allopurinol  100 mg Oral Daily  . antiseptic oral rinse  15 mL Mouth Rinse QID  . aspirin  81 mg Oral Daily  . chlorhexidine  15 mL Mouth Rinse BID  . clopidogrel  75 mg Oral Q  breakfast  . etomidate  40 mg Intravenous Once  . feeding supplement (PRO-STAT SUGAR FREE 64)  30 mL Per Tube TID  . feeding supplement (VITAL HIGH PROTEIN)  1,000 mL Per Tube Q24H  . furosemide  40 mg Oral Once  . glycopyrrolate  1 mg Per Tube BID  . insulin aspart  0-15 Units Subcutaneous Q4H  . magnesium oxide  400 mg Oral Daily  . pantoprazole sodium  40 mg Per Tube Q1200  . potassium chloride  40 mEq Per Tube Once  . sodium chloride  10-40 mL Intracatheter Q12H  . warfarin  10 mg Oral ONCE-1800  . Warfarin - Pharmacist Dosing Inpatient   Does not apply q1800   Continuous Infusions: . sodium chloride 20 mL/hr at 07/31/13 2100  . heparin 1,200 Units/hr (08/01/13 0600)  . nitroGLYCERIN Stopped (07/17/13 1800)   PRN Meds:.acetaminophen, fentaNYL, hydrALAZINE, loperamide, nitroGLYCERIN, ondansetron (ZOFRAN) IV  LABS: Basic Metabolic Panel:  Recent Labs  65/79/03 0500 08/01/13 0415  NA 144 143  K 3.5* 3.4*  CL 109 107  CO2 24 24  GLUCOSE 126* 124*  BUN 13 16  CREATININE 0.77 0.88  CALCIUM 8.5 8.6   Liver Function Tests: No results  found for this basename: AST, ALT, ALKPHOS, BILITOT, PROT, ALBUMIN,  in the last 72 hours No results found for this basename: LIPASE, AMYLASE,  in the last 72 hours CBC:  Recent Labs  07/31/13 0500 08/01/13 0415  WBC 7.6 10.7*  HGB 9.7* 9.3*  HCT 29.8* 28.7*  MCV 91.4 92.3  PLT 386 422*   Cardiac Enzymes: No results found for this basename: CKTOTAL, CKMB, CKMBINDEX, TROPONINI,  in the last 72 hours RADIOLOGY: Dg Chest Portable 1 View  07/17/2013   CLINICAL DATA:  Left-sided chest pain for 3 days, weakness and dizziness.  EXAM: PORTABLE CHEST - 1 VIEW  COMPARISON:  08/03/2012  FINDINGS: Evidence of median sternotomy noted with LVAD in place. Left-sided defibrillator noted. Mild prominence of the cardiac silhouette persists. Tricuspid valvuloplasty reidentified. Trace left pleural fluid or thickening persists. No new pulmonary opacity.   IMPRESSION: No new acute abnormality. Stable appearance of trace left pleural fluid or thickening.   Electronically Signed   By: Christiana PellantGretchen  Green M.D.   On: 07/17/2013 10:12    PHYSICAL EXAM General: NAD; on trach collar;  Neck: JVP 9; trach Lungs: Diffuse rhonchi CV: LVAD pump hum normal.  1+ ankle edema.   Abdomen: Soft, nontender, no hepatosplenomegaly, no distention. Hypoactive BS Neurologic: awake interactive. Expressive aphasia. Moving left foot, slight movement left hand.   Extremities: No clubbing or cyanosis.   TELEMETRY: NSR 80s   Assessment:   1. Acute embolic CVA 2. NSTEMI - Likely pump thrombosis with coronary embolus.  2. Chronic systolic HF s/p HM II VAD  3. NICM EF 15%  4. Hyperkalemia  5. HTN  6. Acute respiratory failure 7. Shock - suspect mixed picture RV failure/vasodilatory 8. Oral bleeding  Plan/Discussion:    He has had NSTEMI and acute CVA due to pump thrombosis/embolism. Now with expressive aphasia and dense L hemiplegia. Neurology has suggested that speech and ability to control secretions will return in several months but L-sided plegia may not recover.  Left leg movement improving, still minimal hand movement. I restarted ASA and Plavix yesterday.  He will continue heparin bridge to therapeutic INR.  LDH has trended down.   On trach collar this am.  Secretions better with Robinul and no epistaxis now.   Deemed too high risk currently for PEG or J-tube.  To keep panda tube for now.   CVP 17 this am, higher. MAPs and flows have been labile likely d/t RV failure but more stable this weekend.  Aiming to keep CVP 10-15 with RV failure. PI events correlated with suctioning and getting up to chair.  I am going to give him Lasix 40 mg po x 1 today and follow response.   NSR off amiodarone.   OOB to chair today and continue to work with PT/OT and Speech.   He will eventually need placement at [facility].  The patient is critically ill with multiple organ  systems failure and requires high complexity decision making for assessment and support, frequent evaluation and titration of therapies, application of advanced monitoring technologies and extensive interpretation of multiple databases.   I reviewed the LVAD parameters from today, and compared the results to the patient's prior recorded data. No programming changes were made. The LVAD is functioning within specified parameters. The patient performs LVAD self-test daily. LVAD interrogation was negative for any significant power changes, alarms or PI events/speed drops. LVAD equipment check completed and is in good working order. Back-up equipment present. LVAD education done on emergency procedures and precautions  and reviewed exit site care.  Aero Drummonds,MD 12:12 PM

## 2013-08-01 NOTE — Progress Notes (Signed)
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 He is s/p LVAD placement at [facility] March 2013, as bridge to transplant. Admitted for evaluation of new NSTEMI felt possibly to be related to pump thrombosis. Hosp course complicated by episodes of CP and hypotension on 1/24 pm hours. 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 PCCM asked to see post-op for vent weaning.    SIGNIFICANT EVENTS / STUDIES:  1/24: admitted w/ CP and trop rise. Did have sub-therapeutic INR at home so concern was for pump thrombosis.  1/24: evening had CP, rising Trop I, got mso4 and NTG>>>got hypotensive requiring pressors.  1/25 INR supra-therapeutic. Coumadin stopped, heparin started 1/26: CT head: negative for bleed.  1/26:cerebral arteriogram:  acute right MCA embolic CVA. Not amendable to extraction due to distal location.  1/30 Extubated but airway protection is questionable at best 2/01 Reintubated 2/3 ENT eval Jearld Fenton) clots suctioned from mouth - no active bleeding site  LINES / TUBES: LVAD OETT 1/26>>>1/30, 2/1 >>2/4 Trach 2/4 >> Right PICC 1/25>>>   CULTURES: Resp 1/27 >> few GNR >> normal flora  ANTIBIOTICS: 1/27 zosyn (aspiration, GNR) >> 2/5  SUBJECTIVE:  Tolerates ATC  , copious oral secretions afebrile No sig bleeding on heparin  VITAL SIGNS: Temp:  [97.8 F (36.6 C)-101 F (38.3 C)] 97.8 F (36.6 C) (02/08 0743) Pulse Rate:  [92-110] 97 (02/08 1000) Resp:  [15-47] 18 (02/08 1000) BP: (87-113)/(38-93) 103/38 mmHg (02/08 1000) SpO2:  [96 %-100 %] 98 % (02/08 1000) Arterial Line BP: (80-122)/(70-90) 104/84 mmHg (02/08 0800) FiO2 (%):  [28 %] 28 %  (02/08 1000) Weight:  [85 kg (187 lb 6.3 oz)] 85 kg (187 lb 6.3 oz) (02/08 0500) HEMODYNAMICS: CVP:  [11 mmHg-17 mmHg] 17 mmHg VENTILATOR SETTINGS: Vent Mode:  [-]  FiO2 (%):  [28 %] 28 % INTAKE / OUTPUT: Intake/Output     02/07 0701 - 02/08 0700 02/08 0701 - 02/09 0700   I.V. (mL/kg) 800 (9.4) 76 (0.9)   Blood     NG/GT 1460 180   IV Piggyback     Total Intake(mL/kg) 2260 (26.6) 256 (3)   Urine (mL/kg/hr) 655 (0.3)    Total Output 655     Net +1605 +256        Stool Occurrence 3 x     PHYSICAL EXAMINATION: General:  No distress Neuro: interactive, LUE weakness 3/5 HEENT: trach in place, bloody secretions from oral cavity Cardiovascular:  LVAD pump hum. No edema  Lungs:  Coarse BS, norhonchi  Abdomen:  Soft, nontender  Musculoskeletal:  Intact  Skin:  Intact   LABS:  CBC  Recent Labs Lab 07/30/13 0500 07/31/13 0500 08/01/13 0415  WBC 9.0 7.6 10.7*  HGB 7.8* 9.7* 9.3*  HCT 23.6* 29.8* 28.7*  PLT 398 386 422*   Coag's  Recent Labs Lab 07/27/13 2110  07/30/13 0500 07/31/13 0500 08/01/13 0415  APTT 39*  --   --   --   --   INR 1.52*  < > 1.36 1.30 1.30  < > = values in this interval not displayed. BMET  Recent Labs Lab 07/30/13 0500 07/31/13 0500 08/01/13 7829  NA 141 144 143  K 3.3* 3.5* 3.4*  CL 104 109 107  CO2 24 24 24   BUN 14 13 16   CREATININE 0.78 0.77 0.88  GLUCOSE 127* 126* 124*   Electrolytes  Recent Labs Lab 07/26/13 0030  07/30/13 0500 07/31/13 0500 08/01/13 0415  CALCIUM 9.0  < > 8.2* 8.5 8.6  MG 2.0  --   --   --   --   PHOS 3.1  --   --   --   --   < > = values in this interval not displayed. Sepsis Markers No results found for this basename: LATICACIDVEN, PROCALCITON, O2SATVEN,  in the last 168 hours ABG  Recent Labs Lab 07/25/13 2007  PHART 7.575*  PCO2ART 33.9*  PO2ART 384.0*   Glucose  Recent Labs Lab 07/31/13 0038 07/31/13 0435 07/31/13 0834 07/31/13 1149 07/31/13 1530 07/31/13 2030  GLUCAP 100*  116* 115* 113* 84 94    Imaging Ir Naso G Tube Plc W/fl W/rad  07/30/2013   CLINICAL DATA:  53 year old with CHF and systolic heart failure. Patient has a left ventricular assist device and recent embolic event due to pump thrombus. The patient will need long-term tube feeding. Patient is being evaluated for percutaneous gastrostomy tube.  EXAM: PLACEMENT OF POST PYLORIC FEEDING TUBE WITH FLUOROSCOPY  Physician: Rachelle HoraAdam R. Henn, MD  MEDICATIONS: None  ANESTHESIA/SEDATION: Moderate sedation time: None  FLUOROSCOPY TIME:  2 min and 30 seconds  PROCEDURE: The patient was placed on the interventional table. Images of the abdomen were obtained. The stomach was distended with gas through the nasogastric tube. A small percutaneous window was identified between the left ventricular assist device and the transverse colon. After discussion with Dr.Bensimhon and Dr. Donata ClayVan Trigt, we felt that the gastrostomy tube placement would be too close to the LVAD and did not want to take the risk of a possible complication or infection. As result, the gastrostomy procedure was not performed.  Attention was directed to placing a post pyloric feeding tube. The existing nasogastric tube was removed over a stiff Glidewire. A weighted feeding tube was advanced over the wire to the pyloric region. The tube was easily advanced into the duodenum and the tube was positioned near the ligament of Treitz. Contrast injection confirmed placement in the small bowel.  COMPLICATIONS: None  FINDINGS: The transverse colon is located high in the abdomen and there was a very small percutaneous window between the left ventricular assist device and the transverse colon. As result, the gastrostomy tube was not performed. A post pyloric feeding tube was placed. The feeding tube tip is near the ligament of Treitz.  IMPRESSION: Percutaneous gastrostomy tube was not performed. A post pyloric feeding tube was placed without complication.   Electronically Signed   By:  Richarda OverlieAdam  Henn M.D.   On: 07/30/2013 15:05    ASSESSMENT / PLAN:  PULMONARY A: Acute respiratory failure 2nd to Rt MCA-CVA and difficulty with airway secretions.  Re-intubated 2/01. Bloody oral secretions ?from traumatic intubation 2/01 -no active bleed on ENT exam/directlaryngoscopy P:   - ATC as tolerated -PM valve -Robinul - good response, can increase tot id prn  CARDIOVASCULAR A:  NSTEMI (felt to be Pump thrombosis) Acute on Chronic systolic HF (EF 10-15%) w/ [facility]  Cardiogenic shock  P - LVAD per cards, co-ox improving - Levophed for MAP >65 - Asa, plavix per cards  -heparin resumed 2/4  RENAL A:   AKI in setting of  hypoperfusion, s/p contrast load for cerebral arteriogram >> improved. Hypokalemia P:   -F/u chemistry  -replete K   GASTROINTESTINAL A:   Nutrition -high risk for PEG or J-tube. P:   -PPI for SUP -resumed tube feeds with panda -bowel regimen -can assess swallow next week once trach downsized , otherwise J tube  HEMATOLOGIC A:   Chronic anticoagulation (on coumadin for LVAD) Concern about acute pump related embolic events P:  - Heparin per pharmacy  -coumadin   INFECTIOUS A:   ? aspiration pneumonia P:   -observe off abx  ENDOCRINE A:   Mild hyperglycemia  P:   -SSI if glucose > 150.  NEUROLOGIC A:   Right MCA embolic CVA.  P:   -per neuro - prn fent -aggressive PT/OT  Needs aggressive rehab now Eventually to [facility], May have to resolve feeding tube issue prior    [facility] V.  [phone]   08/01/2013, 10:43 AM

## 2013-08-01 NOTE — Progress Notes (Signed)
Stroke Team Progress Note  HISTORY [name] is a 53 y.o. male left-handed male who presented with sudden onset left-sided weakness and aphasia. He was admitted 07/17/2013 with chest pain. He has a history of congestive heart failure and has a left ventricular assist device in place. His INR was low at home - his pump got clotted (likely causing MI), and he loaded himself and became supratherapeutic 4.97 in hospital. He was given vitamin K yesterday 07/18/2013, his INR came down to 1.9. He was then  started on heparin. Around 7:40 AM 07/19/2013, the nurse helped him to get dressed and stated that he seemed sleepy, he was moving his left side well. The patient endorsed that his symptoms started after he woke up this morning, but woke up 07/19/2013 in his normal state. He appeared to understand well. Patient was not administerd TPA secondary to being on systemic heparin and INR 1.9  SUBJECTIVE Off ventilator on trach collar. Did not get PEG tube as determined to be high risk.  Able to wiggle L toes today which I have not seen in the past.   OBJECTIVE Most recent Vital Signs: Filed Vitals:   08/01/13 0600 08/01/13 0700 08/01/13 0743 08/01/13 0800  BP:      Pulse: 98 92  92  Temp:   97.8 F (36.6 C)   TempSrc:   Oral   Resp: 21 15  18   Height:      Weight:      SpO2: 99% 99%  98%   CBG (last 3)   Recent Labs  07/31/13 1149 07/31/13 1530 07/31/13 2030  GLUCAP 113* 84 94    IV Fluid Intake:   . sodium chloride 20 mL/hr at 07/31/13 2100  . heparin 1,200 Units/hr (08/01/13 0600)  . nitroGLYCERIN Stopped (07/17/13 1800)  . norepinephrine (LEVOPHED) Adult infusion Stopped (07/30/13 1700)    MEDICATIONS  . allopurinol  100 mg Oral Daily  . antiseptic oral rinse  15 mL Mouth Rinse QID  . aspirin  81 mg Oral Daily  . chlorhexidine  15 mL Mouth Rinse BID  . clopidogrel  75 mg Oral Q breakfast  . etomidate  40 mg Intravenous Once  . feeding supplement (PRO-STAT SUGAR FREE 64)  30 mL Per  Tube TID  . feeding supplement (VITAL HIGH PROTEIN)  1,000 mL Per Tube Q24H  . glycopyrrolate  1 mg Per Tube BID  . insulin aspart  0-15 Units Subcutaneous Q4H  . magnesium oxide  400 mg Oral Daily  . pantoprazole sodium  40 mg Per Tube Q1200  . sodium chloride  10-40 mL Intracatheter Q12H  . warfarin  10 mg Oral ONCE-1800  . Warfarin - Pharmacist Dosing Inpatient   Does not apply q1800   PRN:  acetaminophen, fentaNYL, hydrALAZINE, nitroGLYCERIN, ondansetron (ZOFRAN) IV  Diet:  NPO  Activity:  Bedrest DVT Prophylaxis:  IV heparin  CLINICALLY SIGNIFICANT STUDIES Basic Metabolic Panel:   Recent Labs Lab 07/26/13 0030  07/31/13 0500 08/01/13 0415  NA 136*  < > 144 143  K 3.5*  < > 3.5* 3.4*  CL 96  < > 109 107  CO2 26  < > 24 24  GLUCOSE 120*  < > 126* 124*  BUN 17  < > 13 16  CREATININE 1.15  < > 0.77 0.88  CALCIUM 9.0  < > 8.5 8.6  MG 2.0  --   --   --   PHOS 3.1  --   --   --   < > =  values in this interval not displayed. Liver Function Tests:   Recent Labs Lab 07/28/13 0330  AST 27  ALT 16  ALKPHOS 63  BILITOT 0.9  PROT 6.8  ALBUMIN 2.6*   CBC:   Recent Labs Lab 07/31/13 0500 08/01/13 0415  WBC 7.6 10.7*  HGB 9.7* 9.3*  HCT 29.8* 28.7*  MCV 91.4 92.3  PLT 386 422*   Coagulation:   Recent Labs Lab 07/29/13 1830 07/30/13 0500 07/31/13 0500 08/01/13 0415  LABPROT 16.8* 16.4* 15.9* 15.9*  INR 1.40 1.36 1.30 1.30   Cardiac Enzymes:  No results found for this basename: CKTOTAL, CKMB, CKMBINDEX, TROPONINI,  in the last 168 hours Urinalysis: No results found for this basename: COLORURINE, APPERANCEUR, LABSPEC, PHURINE, GLUCOSEU, HGBUR, BILIRUBINUR, KETONESUR, PROTEINUR, UROBILINOGEN, NITRITE, LEUKOCYTESUR,  in the last 168 hours Lipid Panel    Component Value Date/Time   CHOL 166 07/20/2013 0020   TRIG 96 07/20/2013 0020   HDL 63 07/20/2013 0020   CHOLHDL 2.6 07/20/2013 0020   VLDL 19 07/20/2013 0020   LDLCALC 84 07/20/2013 0020   HgbA1C  Lab  Results  Component Value Date   HGBA1C 5.7* 07/19/2013    Urine Drug Screen:     Component Value Date/Time   LABOPIA NONE DETECTED 06/21/2008 1300   COCAINSCRNUR NONE DETECTED 06/21/2008 1300   LABBENZ NONE DETECTED 06/21/2008 1300   AMPHETMU NONE DETECTED 06/21/2008 1300   THCU NONE DETECTED 06/21/2008 1300   LABBARB  Value: NONE DETECTED        DRUG SCREEN FOR MEDICAL PURPOSES ONLY.  IF CONFIRMATION IS NEEDED FOR ANY PURPOSE, NOTIFY LAB WITHIN 5 DAYS. 06/21/2008 1300    Alcohol Level: No results found for this basename: ETH,  in the last 168 hours   CT of the brain   07/20/2013 1. New hypodensities in the left basal ganglia and left thalamus,  concerning for evolving acute infarcts. Smaller foci of hypoattenuation in the right caudate and thalamus may also represent acute ischemia.  2. New paranasal sinus mucosal disease. 07/19/2013    1. No acute intracranial process. 2. Very minimal amount of geographic subcortical encephalomalacia within in the anterior lateral aspect of the left temporal lobe, at the site of the patient's prior hemorrhagic contusion.   Cerebral Angiogram 07/20/2013 Rt CFa approach. Distal small filling defects in the the orbitofrontal br of RT MCA sup division,and tertiary angular division of RT MCA  MRI of the brain    2D Echocardiogram  EF 10%. Diffuse kypokinesis  TEE 07/21/2013 no evidence of clot but with RV failure.   CXR   07/28/2013 Tracheostomy tube with the tip 3 cm above the carina.   EKG  sinus tachycardia. For complete results please see formal report.   Therapy Recommendations   Physical Exam   Young african [place] male with tracheostomy. Afebrile. Head is nontraumatic. Neck is supple without bruit. Cardiac exam no murmur or gallop. Lungs are clear to auscultation. Distal pulses are well felt. LVAD right side abdomen Neurological Exam :  Awake  and follows commands well on R side. Pupils 3 mm sluggishly reactive. Doll's eye movements are present.  .Eyes partially open and will blink to threat and follow gaze. Facial grimacing to pain. Spontaneous movement of RUE L sided hemiplegia but can move toes and fingers slightly, following commands on R side with antigravity movements.   ASSESSMENT [name] is a 53 y.o. male presenting with left sided weakness.  Imaging supports left basal ganglia and thalamic infarcts  larger than right basal ganglia subcortical  infarcts. Infarcts cardioembolic secondary to potential sources of atrial fibrillation, LVAD clot and/or acute MI.  On aspirin 81 mg orally every day, clopidogrel 75 mg orally every day and warfarin prior to admission. Now on no antiplatelets or anticoagulants for secondary stroke prevention. Patient with resultant left arm hemiplegia, left leg hemiparesis, dysphagia, VDRF.  Respiratory failure, extubated 07/25/2013 with profuse oral bleeding, reintubated same day to protect airway - IV heparin, aspirin and plavix on hold. Trach placement delayed secondary to elevated INR 2.09. FFP given prior to trach placement. Trach placxed 07/28/2013 by ENT without difficulty.   Systolic HF, EF 10%, has LVAD. On transplant list prior to admission. Given new stroke, no longer a transplant candidate.  new onset atrial fibrillation with RVR  NSTEMI - Elevated troponins - will manage medically hypertension Chronic RI Hyperlipidemia, LDL 84, on no statin PTA, now on no statin, at goal LDL < 100  obstructive sleep apnea Hx migraines Bipolar affective disorder TEE performed 07/21/2013. Ejection fraction 15-20%. No obvious clot was noted. INR 1.30 for 2 days.   Hospital day # 15  TREATMENT/PLAN Able to wiggle L toes  Potassium supplementation   Being restarted on ASA and Plavix w/out load.   On heparin gtt with bridging for coumadin  PEG placement attempted but pt was believed to be high risk due to current medical co morbidities. Recommended to continue PANDA.    LTACH placement anticipated  to [facility]  OOB, therapy evals when stable  [name]

## 2013-08-01 NOTE — Progress Notes (Signed)
ANTICOAGULATION CONSULT NOTE - Follow Up Consult  Pharmacy Consult for Heparin, resume Coumadin Indication: LVAD thrombosis/CVA    Allergies  Allergen Reactions  . Ace Inhibitors Cough  . Lexapro [Escitalopram Oxalate] Other (See Comments)    somnolence    Patient Measurements: Height: 5\' 4"  (162.6 cm) Weight: 187 lb 6.3 oz (85 kg) IBW/kg (Calculated) : 59.2 Heparin Dosing Weight: 77kg  Vital Signs: Temp: 98.7 F (37.1 C) (02/08 0400) Temp src: Oral (02/08 0400) BP: 87/74 mmHg (02/08 0444) Pulse Rate: 92 (02/08 0700)  Labs:  Recent Labs  07/30/13 0500 07/30/13 2100 07/31/13 0500 08/01/13 0415  HGB 7.8*  --  9.7* 9.3*  HCT 23.6*  --  29.8* 28.7*  PLT 398  --  386 422*  LABPROT 16.4*  --  15.9* 15.9*  INR 1.36  --  1.30 1.30  HEPARINUNFRC 0.20* 0.30 0.42 0.36  CREATININE 0.78  --  0.77 0.88    Estimated Creatinine Clearance: 98.7 ml/min (by C-G formula based on Cr of 0.88).  . sodium chloride 20 mL/hr at 07/31/13 2100  . heparin 1,200 Units/hr (08/01/13 0600)  . nitroGLYCERIN Stopped (07/17/13 1800)  . norepinephrine (LEVOPHED) Adult infusion Stopped (07/30/13 1700)    Assessment: 52yom with LVAD, suspected pump thrombosis and acute CVA continues on heparin bridge to warfarin. Heparin level remains therapeutic at 0.36. H/H 9.3/28.7, Plts 422. No bleeding reported, now with clear secretions. Per Dr. Shirlee Latch, patient at high risk for PEG or J-tube, Panda tube continued.   INR 02/08 unchanged at 1.30 after 2 doses of 5mg  PTA home dose: 5mg  daily except 6mg  on Fridays  Patient now off amio (1/27-2/6)  Goal of Therapy:  Heparin level 0.3-0.5 units/ml Monitor platelets by anticoagulation protocol: Yes INR 2.0-3.0   Plan:  Continue heparin drip 1200 units/hr (12 ml/hr) Will continue to monitor bleeding Coumadin 10mg  x 1 tonight Daily heparin level, CBC, and INR   Rafaella Kole B. Artelia Laroche, PharmD Clinical Pharmacist - Resident Phone: [phone] Pager:  [phone] 08/01/2013 7:20 AM

## 2013-08-02 DIAGNOSIS — R1310 Dysphagia, unspecified: Secondary | ICD-10-CM

## 2013-08-02 DIAGNOSIS — R059 Cough, unspecified: Secondary | ICD-10-CM

## 2013-08-02 DIAGNOSIS — D596 Hemoglobinuria due to hemolysis from other external causes: Secondary | ICD-10-CM

## 2013-08-02 DIAGNOSIS — R05 Cough: Secondary | ICD-10-CM

## 2013-08-02 DIAGNOSIS — I749 Embolism and thrombosis of unspecified artery: Secondary | ICD-10-CM

## 2013-08-02 LAB — GLUCOSE, CAPILLARY
GLUCOSE-CAPILLARY: 116 mg/dL — AB (ref 70–99)
GLUCOSE-CAPILLARY: 121 mg/dL — AB (ref 70–99)
GLUCOSE-CAPILLARY: 125 mg/dL — AB (ref 70–99)
Glucose-Capillary: 111 mg/dL — ABNORMAL HIGH (ref 70–99)
Glucose-Capillary: 115 mg/dL — ABNORMAL HIGH (ref 70–99)
Glucose-Capillary: 99 mg/dL (ref 70–99)
Glucose-Capillary: 99 mg/dL (ref 70–99)
Glucose-Capillary: 99 mg/dL (ref 70–99)

## 2013-08-02 LAB — CBC
HCT: 27.9 % — ABNORMAL LOW (ref 39.0–52.0)
Hemoglobin: 8.9 g/dL — ABNORMAL LOW (ref 13.0–17.0)
MCH: 29.5 pg (ref 26.0–34.0)
MCHC: 31.9 g/dL (ref 30.0–36.0)
MCV: 92.4 fL (ref 78.0–100.0)
PLATELETS: 412 10*3/uL — AB (ref 150–400)
RBC: 3.02 MIL/uL — ABNORMAL LOW (ref 4.22–5.81)
RDW: 16.7 % — ABNORMAL HIGH (ref 11.5–15.5)
WBC: 9.3 10*3/uL (ref 4.0–10.5)

## 2013-08-02 LAB — CARBOXYHEMOGLOBIN
Carboxyhemoglobin: 1.8 % — ABNORMAL HIGH (ref 0.5–1.5)
Methemoglobin: 0.5 % (ref 0.0–1.5)
O2 SAT: 71.8 %
TOTAL HEMOGLOBIN: 9.2 g/dL — AB (ref 13.5–18.0)

## 2013-08-02 LAB — PROTIME-INR
INR: 1.51 — AB (ref 0.00–1.49)
PROTHROMBIN TIME: 17.8 s — AB (ref 11.6–15.2)

## 2013-08-02 LAB — BASIC METABOLIC PANEL
BUN: 17 mg/dL (ref 6–23)
CALCIUM: 8.5 mg/dL (ref 8.4–10.5)
CO2: 24 mEq/L (ref 19–32)
Chloride: 108 mEq/L (ref 96–112)
Creatinine, Ser: 0.87 mg/dL (ref 0.50–1.35)
GLUCOSE: 127 mg/dL — AB (ref 70–99)
POTASSIUM: 3.6 meq/L — AB (ref 3.7–5.3)
Sodium: 142 mEq/L (ref 137–147)

## 2013-08-02 LAB — LACTATE DEHYDROGENASE: LDH: 805 U/L — ABNORMAL HIGH (ref 94–250)

## 2013-08-02 LAB — HEPARIN LEVEL (UNFRACTIONATED): HEPARIN UNFRACTIONATED: 0.41 [IU]/mL (ref 0.30–0.70)

## 2013-08-02 MED ORDER — SPIRONOLACTONE 12.5 MG HALF TABLET
12.5000 mg | ORAL_TABLET | Freq: Every day | ORAL | Status: DC
  Administered 2013-08-02: 12.5 mg via ORAL
  Filled 2013-08-02 (×2): qty 1

## 2013-08-02 MED ORDER — WARFARIN SODIUM 10 MG PO TABS
10.0000 mg | ORAL_TABLET | Freq: Once | ORAL | Status: AC
  Administered 2013-08-02: 10 mg via ORAL
  Filled 2013-08-02: qty 1

## 2013-08-02 MED ORDER — POTASSIUM CHLORIDE 20 MEQ/15ML (10%) PO LIQD
40.0000 meq | Freq: Once | ORAL | Status: AC
  Administered 2013-08-02: 40 meq via ORAL
  Filled 2013-08-02: qty 30

## 2013-08-02 MED ORDER — FUROSEMIDE 10 MG/ML IJ SOLN
40.0000 mg | Freq: Once | INTRAMUSCULAR | Status: AC
  Administered 2013-08-02: 40 mg via INTRAVENOUS
  Filled 2013-08-02: qty 4

## 2013-08-02 NOTE — Progress Notes (Signed)
Patient ID: [name], male   DOB: 06/15/1961, 53 y.o.   MRN: [id number] Advanced Heart Failure Rounding Note  [name] is 53 y/o male with a history of severe CHF due to NICM EF 10-15%, and severe MR s/p HM II LVAD placed at [facility] March 2013. His medical history also consists of CRI, NSVT s/p ICD and LV thrombus.   Admitted 1/24 with NSTEMI felt secondary due embolic phenomenon due to pump thrombosis.  LDH high around 900.  On way to cath lab 1/26 developed acute onset of aphasia and left-sided hemiparesis. CT negative for bleed. Taken emergently to neuro-interventional suite but no amenable lesions. Remains intubated on levophed. Remains on propofol gtt, unable to assess neuro status.  S/P TEE 1/28. No evidence of clot but with RV failure.   Extubated 1/30. Reintubated 2/2 due to inability control secretions.   Trach placed due to difficulty with secretions and on trach collar this am.  Off norepinephrine. Map 80-90s . On heparin.  CVP 15 this morning.  Denies dyspnea.   Hgb 11.6> 9.8>9.0> 8.5>9.7>9.3>8.9  LDH:  976>1568>1165>1124>937>870>762>787>805 Co-ox: 62%>57%>63%>63%>70%>71%  INR 1.4>1.69>1.3>1.5   VAD interrogated personally.  Flow 5.5, Speed 9200 PI 6.5,  Power 5.7   Events: I PI this am with suctioning.   Filed Vitals:   08/02/13 0600 08/02/13 0700 08/02/13 0800 08/02/13 0827  BP:    101/85  Pulse: 105 105 102 108  Temp:   98.9 F (37.2 C)   TempSrc:   Oral   Resp: 12 21 21 25   Height:      Weight:      SpO2: 100% 98% 99% 99%    Intake/Output Summary (Last 24 hours) at 08/02/13 0835 Last data filed at 08/02/13 0800  Gross per 24 hour  Intake   1978 ml  Output   1846 ml  Net    132 ml   Scheduled Meds: . allopurinol  100 mg Oral Daily  . antiseptic oral rinse  15 mL Mouth Rinse QID  . aspirin  81 mg Oral Daily  . chlorhexidine  15 mL Mouth Rinse BID  . clopidogrel  75 mg Oral Q breakfast  . etomidate  40 mg Intravenous Once  . feeding supplement (PRO-STAT SUGAR  FREE 64)  30 mL Per Tube TID  . feeding supplement (VITAL HIGH PROTEIN)  1,000 mL Per Tube Q24H  . glycopyrrolate  1 mg Per Tube BID  . insulin aspart  0-15 Units Subcutaneous Q4H  . magnesium oxide  400 mg Oral Daily  . pantoprazole sodium  40 mg Per Tube Q1200  . sodium chloride  10-40 mL Intracatheter Q12H  . Warfarin - Pharmacist Dosing Inpatient   Does not apply q1800   Continuous Infusions: . sodium chloride 20 mL/hr at 08/02/13 0800  . heparin 1,200 Units/hr (08/01/13 2300)  . nitroGLYCERIN Stopped (07/17/13 1800)   PRN Meds:.acetaminophen, fentaNYL, hydrALAZINE, loperamide, nitroGLYCERIN, ondansetron (ZOFRAN) IV  LABS: Basic Metabolic Panel:  Recent Labs  43/15/40 0415 08/02/13 0430  NA 143 142  K 3.4* 3.6*  CL 107 108  CO2 24 24  GLUCOSE 124* 127*  BUN 16 17  CREATININE 0.88 0.87  CALCIUM 8.6 8.5   Liver Function Tests: No results found for this basename: AST, ALT, ALKPHOS, BILITOT, PROT, ALBUMIN,  in the last 72 hours No results found for this basename: LIPASE, AMYLASE,  in the last 72 hours CBC:  Recent Labs  08/01/13 0415 08/02/13 0430  WBC 10.7* 9.3  HGB 9.3* 8.9*  HCT 28.7* 27.9*  MCV 92.3 92.4  PLT 422* 412*   Cardiac Enzymes: No results found for this basename: CKTOTAL, CKMB, CKMBINDEX, TROPONINI,  in the last 72 hours RADIOLOGY: Dg Chest Portable 1 View  07/17/2013   CLINICAL DATA:  Left-sided chest pain for 3 days, weakness and dizziness.  EXAM: PORTABLE CHEST - 1 VIEW  COMPARISON:  08/03/2012  FINDINGS: Evidence of median sternotomy noted with LVAD in place. Left-sided defibrillator noted. Mild prominence of the cardiac silhouette persists. Tricuspid valvuloplasty reidentified. Trace left pleural fluid or thickening persists. No new pulmonary opacity.  IMPRESSION: No new acute abnormality. Stable appearance of trace left pleural fluid or thickening.   Electronically Signed   By: Christiana PellantGretchen  Green M.D.   On: 07/17/2013 10:12    PHYSICAL EXAM- CVP  ~15  General: NAD; on trach collar;  Neck: JVP 11; trach Lungs: Diffuse rhonchi CV: LVAD pump hum normal.   Abdomen: Soft, nontender, no hepatosplenomegaly, no distention. Hypoactive BS Neurologic: awake interactive. Expressive aphasia. Moving left foot, slight movement left hand.   Extremities: No clubbing or cyanosis. LUE 1+ edema RUE PICC  TELEMETRY: ST 100s    Assessment:   1. Acute embolic CVA 2. NSTEMI - Likely pump thrombosis with coronary embolus.  2. Chronic systolic HF s/p HM II VAD  3. NICM EF 15%  4. Hyperkalemia  5. HTN  6. Acute respiratory failure 7. Shock - suspect mixed picture RV failure/vasodilatory 8. Oral bleeding  Plan/Discussion:    He has had NSTEMI and acute CVA due to pump thrombosis/embolism. Now with expressive aphasia and dense L hemiplegia. Neurology has suggested that speech and ability to control secretions will return in several months but L-sided plegia may not recover.   Hemoglobin drifting down. For now continue  ASA and Plavix.  He will continue heparin bridge to therapeutic INR.  LDH going up 787>805  On trach collar this am.  Secretions better with Robinul and no epistaxis now.   Deemed too high risk currently for PEG or J-tube.  To keep panda tube for now.   CVP 15 this am.  Weight up one pound. Add 12.5 mg spironolactone. MAPs 80-90s. Hold off on BB.   ST off amiodarone.   Continue to work with PT/OT and Speech.   SW following for [facility] placement.   The patient is critically ill with multiple organ systems failure and requires high complexity decision making for assessment and support, frequent evaluation and titration of therapies, application of advanced monitoring technologies and extensive interpretation of multiple databases.   I reviewed the LVAD parameters from today, and compared the results to the patient's prior recorded data. No programming changes were made. The LVAD is functioning within specified parameters. The  patient performs LVAD self-test daily. LVAD interrogation was negative for any significant power changes, alarms or PI events/speed drops. LVAD equipment check completed and is in good working order. Back-up equipment present. LVAD education done on emergency procedures and precautions and reviewed exit site care.  CLEGG,AMY NP-C  8:35 AM  Patient seen with NP, agree with the above note.   Will give Lasix 40 mg IV x 1 today and watch response.  CVP 15.  Will try to be careful balancing RV preload and pulmonary edema but think we need to clear his lungs a bit.   Continue to await therapeutic INR.   Hemoglobin drifting down.  Follow closely, likely some hemolysis.   Marca AnconaDalton Khadar Monger 08/02/2013 9:24 AM

## 2013-08-02 NOTE — Evaluation (Signed)
Occupational Therapy Evaluation Patient Details Name: [name] MRN: [id number] DOB: 07/16/1960 Today's Date: 08/02/2013 Time: [id number] OT Time Calculation (min): 24 min  OT Assessment / Plan / Recommendation History of present illness [name] is 53 y/o male with a history of severe CHF due to NICM EF 10-15%, and severe MR s/p HM II [facility]. His medical history also consists of CRI, NSVT s/p ICD and LV thrombus. Admitted 1/24 with NSTEMI felt secondary due embolic phenomenon due to pump thrombosis.  LDH high around 900.  Extubated 1/30. Reintubated 2/2 due to inability control secretions. Trach 2/4 and on trach collar this am. Smiling and denies any pain. MAPs 80-90s on levophed gtt 2 mcg. No further AF on amio. Pending PEG today - heparin on hold. Starting to move L side minimally.    Clinical Impression   Pt admitted with above and demonstrated the below listed deficits.  He will benefit from continued OT to maximize safety and independence with BADLs.  Currently, he appears to have flaccid Lt. UE, but demonstrates ability to perform transfers with mod A and second person for lines/safety.  Currently, it appears that plan for discharge is LTACH; however, based on current level feel he may be a good candidate for CIR.     OT Assessment  Patient needs continued OT Services    Follow Up Recommendations  CIR;Supervision/Assistance - 24 hour    Barriers to Discharge Other (comment) no family present  Equipment Recommendations  3 in 1 bedside comode    Recommendations for Other Services Rehab consult  Frequency  Min 3X/week    Precautions / Restrictions Precautions Precautions: Fall   Pertinent Vitals/Pain     ADL  Eating/Feeding: NPO Grooming: Wash/dry hands;Teeth care;Min guard Where Assessed - Grooming: Unsupported sitting (in chair) Upper Body Bathing: Maximal assistance Where Assessed - Upper Body Bathing: Supported sitting Lower Body Bathing: Maximal  assistance Where Assessed - Lower Body Bathing: Supported sit to stand Upper Body Dressing: Maximal assistance Where Assessed - Upper Body Dressing: Unsupported sit to stand Lower Body Dressing: +1 Total assistance Where Assessed - Lower Body Dressing: Supported sit to Pharmacist, hospitalstand Toilet Transfer: Moderate assistance Toilet Transfer Method: Sit to stand;Stand pivot Toilet Transfer Equipment: Bedside commode Toileting - Clothing Manipulation and Hygiene: +1 Total assistance Where Assessed - Toileting Clothing Manipulation and Hygiene: Standing Transfers/Ambulation Related to ADLs: mod A for transfer    OT Diagnosis: Generalized weakness;Hemiplegia non-dominant side  OT Problem List: Decreased strength;Decreased range of motion;Decreased activity tolerance;Impaired balance (sitting and/or standing);Decreased coordination;Decreased cognition;Decreased safety awareness;Decreased knowledge of use of DME or AE;Cardiopulmonary status limiting activity;Impaired UE functional use;Increased edema OT Treatment Interventions: Self-care/ADL training;Neuromuscular education;DME and/or AE instruction;Splinting;Therapeutic activities;Patient/family education;Balance training   OT Goals(Current goals can be found in the care plan section) Acute Rehab OT Goals Patient Stated Goal: Pt nodded head in agrement to OT goals OT Goal Formulation: With patient Time For Goal Achievement: 08/16/13 Potential to Achieve Goals: Good ADL Goals Pt Will Perform Grooming: with supervision;sitting Pt Will Perform Upper Body Bathing: with min assist;sitting Pt Will Perform Lower Body Bathing: sit to/from stand;with mod assist Pt Will Perform Upper Body Dressing: sitting;with mod assist Pt Will Perform Lower Body Dressing: with max assist;sit to/from stand Pt Will Transfer to Toilet: with min assist;stand pivot transfer;bedside commode Pt Will Perform Toileting - Clothing Manipulation and hygiene: with mod assist;sit to/from  stand Additional ADL Goal #1: Pt will use Lt. UE as a stabilizer during BADLs Additional ADL Goal #  2: Pt will be able to manage LVAD equipment with mod A  Visit Information  Last OT Received On: 08/02/13 Assistance Needed: +2 History of Present Illness: [name] is 53 y/o male with a history of severe CHF due to NICM EF 10-15%, and severe MR s/p HM II LVAD placed at [facility] March 2013. His medical history also consists of CRI, NSVT s/p ICD and LV thrombus. Admitted 1/24 with NSTEMI felt secondary due embolic phenomenon due to pump thrombosis.  LDH high around 900.  Extubated 1/30. Reintubated 2/2 due to inability control secretions. Trach 2/4 and on trach collar this am. Smiling and denies any pain. MAPs 80-90s on levophed gtt 2 mcg. No further AF on amio. Pending PEG today - heparin on hold. Starting to move L side minimally.        Prior Functioning     Home Living Family/patient expects to be discharged to:: Unsure Prior Function Level of Independence: Independent with assistive device(s) Communication Communication: Expressive difficulties;Tracheostomy Dominant Hand: Right         Vision/Perception Vision - History Patient Visual Report: No change from baseline Vision - Assessment Eye Alignment: Within Functional Limits Vision Assessment: Vision tested Ocular Range of Motion: Within Functional Limits Tracking/Visual Pursuits: Able to track stimulus in all quads without difficulty Additional Comments: visual field grossly [facility] Perception Perception: Within Functional Limits (grossly) Praxis Praxis: Intact   Cognition  Cognition Arousal/Alertness: Awake/alert Behavior During Therapy: Flat affect Overall Cognitive Status: Difficult to assess Difficult to assess due to: Tracheostomy    Extremity/Trunk Assessment Upper Extremity Assessment Upper Extremity Assessment: LUE deficits/detail LUE Deficits / Details: Lt UE flaccid.  PROM WFL; Mod edema.  Instructe pt and nursing to  keep Lt. UE elevated Lower Extremity Assessment Lower Extremity Assessment: Defer to PT evaluation Cervical / Trunk Assessment Cervical / Trunk Assessment: Other exceptions Cervical / Trunk Exceptions: Pt loses balance to Rt.  Passive elongation of Lt trunk noted.       Mobility Bed Mobility Overal bed mobility: Needs Assistance;+2 for physical assistance Bed Mobility: Sit to Supine Sit to supine: Mod assist General bed mobility comments: Pt required assist to lower trunk onto bed and to lift Lt. LE onto bed.  Mod facilitation to bridge to scoot hips Transfers Overall transfer level: Needs assistance Transfers: Sit to/from Stand;Stand Pivot Transfers Sit to Stand: Mod assist;+2 safety/equipment Stand pivot transfers: Mod assist;+2 safety/equipment General transfer comment: Assist/facilitation Lt. trunk/hip and for Lt knee extension.  Pt maintained cotroller and assist from RN for lines     Exercise     Balance Balance Overall balance assessment: Needs assistance Sitting-balance support: No upper extremity supported;Feet supported Sitting balance-Leahy Scale: Good Sitting balance - Comments: While EOC. Worked on reaching in all planes and with rotation to Lt. with supervision.  No LOB. Pt migrates to Rt at times, but self corrects Standing balance support: Single extremity supported Standing balance-Leahy Scale: Fair   End of Session OT - End of Session Activity Tolerance: Patient tolerated treatment well Patient left: in bed;with call bell/phone within reach;with nursing/sitter in room Nurse Communication: Mobility status  GO     Jeani Hawking M 08/02/2013, 5:46 PM

## 2013-08-02 NOTE — Progress Notes (Signed)
Stroke Team Progress Note  HISTORY Tarvis Blossom is a 53 y.o. male left-handed male who presented with sudden onset left-sided weakness and aphasia. He was admitted 07/17/2013 with chest pain. He has a history of congestive heart failure and has a left ventricular assist device in place. His INR was low at home - his pump got clotted (likely causing MI), and he loaded himself and became supratherapeutic 4.97 in hospital. He was given vitamin K yesterday 07/18/2013, his INR came down to 1.9. He was then  started on heparin. Around 7:40 AM 07/19/2013, the nurse helped him to get dressed and stated that he seemed sleepy, he was moving his left side well. The patient endorsed that his symptoms started after he woke up this morning, but woke up 07/19/2013 in his normal state. He appeared to understand well. Patient was not administerd TPA secondary to being on systemic heparin and INR 1.9  SUBJECTIVE Patient up in chair at bedside.   OBJECTIVE Most recent Vital Signs: Filed Vitals:   08/02/13 0827 08/02/13 1000 08/02/13 1213 08/02/13 1230  BP: 101/85   99/80  Pulse: 108 107  109  Temp:   99.6 F (37.6 C)   TempSrc:   Oral   Resp: 25 25  21   Height:      Weight:      SpO2: 99% 96%  98%   CBG (last 3)   Recent Labs  08/02/13 0021 08/02/13 0430 08/02/13 0731  GLUCAP 111* 115* 121*    IV Fluid Intake:   . sodium chloride 20 mL/hr at 08/02/13 1100  . heparin 1,200 Units/hr (08/02/13 1100)  . nitroGLYCERIN Stopped (07/17/13 1800)    MEDICATIONS  . allopurinol  100 mg Oral Daily  . antiseptic oral rinse  15 mL Mouth Rinse QID  . aspirin  81 mg Oral Daily  . chlorhexidine  15 mL Mouth Rinse BID  . clopidogrel  75 mg Oral Q breakfast  . etomidate  40 mg Intravenous Once  . feeding supplement (PRO-STAT SUGAR FREE 64)  30 mL Per Tube TID  . feeding supplement (VITAL HIGH PROTEIN)  1,000 mL Per Tube Q24H  . glycopyrrolate  1 mg Per Tube BID  . insulin aspart  0-15 Units Subcutaneous Q4H  .  magnesium oxide  400 mg Oral Daily  . pantoprazole sodium  40 mg Per Tube Q1200  . sodium chloride  10-40 mL Intracatheter Q12H  . spironolactone  12.5 mg Oral Daily  . warfarin  10 mg Oral ONCE-1800  . Warfarin - Pharmacist Dosing Inpatient   Does not apply q1800   PRN:  acetaminophen, fentaNYL, hydrALAZINE, loperamide, nitroGLYCERIN, ondansetron (ZOFRAN) IV  Diet:  NPO, tube feeding Activity:  Up in chair DVT Prophylaxis:  IV heparin, warfarin  CLINICALLY SIGNIFICANT STUDIES Basic Metabolic Panel:   Recent Labs Lab 08/01/13 0415 08/02/13 0430  NA 143 142  K 3.4* 3.6*  CL 107 108  CO2 24 24  GLUCOSE 124* 127*  BUN 16 17  CREATININE 0.88 0.87  CALCIUM 8.6 8.5   Liver Function Tests:   Recent Labs Lab 07/28/13 0330  AST 27  ALT 16  ALKPHOS 63  BILITOT 0.9  PROT 6.8  ALBUMIN 2.6*   CBC:   Recent Labs Lab 08/01/13 0415 08/02/13 0430  WBC 10.7* 9.3  HGB 9.3* 8.9*  HCT 28.7* 27.9*  MCV 92.3 92.4  PLT 422* 412*   Coagulation:   Recent Labs Lab 07/30/13 0500 07/31/13 0500 08/01/13 0415 08/02/13 0430  LABPROT 16.4* 15.9* 15.9* 17.8*  INR 1.36 1.30 1.30 1.51*   Cardiac Enzymes:  No results found for this basename: CKTOTAL, CKMB, CKMBINDEX, TROPONINI,  in the last 168 hours Urinalysis: No results found for this basename: COLORURINE, APPERANCEUR, LABSPEC, PHURINE, GLUCOSEU, HGBUR, BILIRUBINUR, KETONESUR, PROTEINUR, UROBILINOGEN, NITRITE, LEUKOCYTESUR,  in the last 168 hours Lipid Panel    Component Value Date/Time   CHOL 166 07/20/2013 0020   TRIG 96 07/20/2013 0020   HDL 63 07/20/2013 0020   CHOLHDL 2.6 07/20/2013 0020   VLDL 19 07/20/2013 0020   LDLCALC 84 07/20/2013 0020   HgbA1C  Lab Results  Component Value Date   HGBA1C 5.7* 07/19/2013    Urine Drug Screen:     Component Value Date/Time   LABOPIA NONE DETECTED 06/21/2008 1300   COCAINSCRNUR NONE DETECTED 06/21/2008 1300   LABBENZ NONE DETECTED 06/21/2008 1300   AMPHETMU NONE DETECTED  06/21/2008 1300   THCU NONE DETECTED 06/21/2008 1300   LABBARB  Value: NONE DETECTED        DRUG SCREEN FOR MEDICAL PURPOSES ONLY.  IF CONFIRMATION IS NEEDED FOR ANY PURPOSE, NOTIFY LAB WITHIN 5 DAYS. 06/21/2008 1300    Alcohol Level: No results found for this basename: ETH,  in the last 168 hours   CT of the brain   07/20/2013 1. New hypodensities in the left basal ganglia and left thalamus,  concerning for evolving acute infarcts. Smaller foci of hypoattenuation in the right caudate and thalamus may also represent acute ischemia.  2. New paranasal sinus mucosal disease. 07/19/2013    1. No acute intracranial process. 2. Very minimal amount of geographic subcortical encephalomalacia within in the anterior lateral aspect of the left temporal lobe, at the site of the patient's prior hemorrhagic contusion.   Cerebral Angiogram 07/20/2013 Rt CFa approach. Distal small filling defects in the the orbitofrontal br of RT MCA sup division,and tertiary angular division of RT MCA  MRI of the brain    2D Echocardiogram  EF 10%. Diffuse kypokinesis  TEE 07/21/2013 no evidence of clot but with RV failure.   CXR   07/28/2013 Tracheostomy tube with the tip 3 cm above the carina.   EKG  sinus tachycardia. For complete results please see formal report.   Therapy Recommendations LTACH  Physical Exam   Young african [place] male with tracheostomy. Afebrile. Head is nontraumatic. Neck is supple without bruit. Cardiac exam no murmur or gallop. Lungs are clear to auscultation. Distal pulses are well felt. LVAD right side abdomen Neurological Exam :  Awake  and follows commands well on R side. Pupils 3 mm sluggishly reactive. Doll's eye movements are present. .Eyes partially open and will blink to threat and follow gaze. Facial grimacing to pain. Spontaneous movement of RUE L sided hemiplegia but can move  Leg and toes   slightly, following commands on R side with antigravity movements.   ASSESSMENT Mr. Arvey Lader is a 53 y.o. male presenting with left sided weakness.  Imaging supports left basal ganglia and thalamic infarcts larger than right basal ganglia subcortical  infarcts. Infarcts cardioembolic secondary to potential sources of atrial fibrillation, LVAD clot and/or acute MI. TEE performed 07/21/2013 without source. On aspirin 81 mg orally every day, clopidogrel 75 mg orally every day and warfarin prior to admission. Now on aspirin 81 mg, Plavix 75 mg, IV heparin and warfarin for secondary stroke prevention. Patient with resultant left arm hemiplegia, left leg hemiparesis (now able to lifting foot off the ground and kick  foot forward), dysphagia.  Respiratory failure, extubated 07/25/2013 with profuse oral bleeding, reintubated same day to protect airway - IV heparin, aspirin and plavix on hold. Trach placement delayed secondary to elevated INR 2.09. FFP given prior to trach placement. Trach placxed 07/28/2013 by ENT without difficulty. On trach collar now. Dysphagia secondary to stroke. PEG placement attempted but pt was believed to be high risk. Recommended to continue PANDA. Seen by speech therapist this morning with significant coughing.   Systolic HF, EF 10%, has LVAD. On transplant list prior to admission. Given new stroke, no longer a transplant candidate.  new onset atrial fibrillation with RVR. On amiodarone. Back in NSR  NSTEMI - Elevated troponins - will manage medically hypertension Chronic RI Hyperlipidemia, LDL 84, on no statin PTA, now on no statin, at goal LDL < 100  obstructive sleep apnea Hx migraines Bipolar affective disorder Ejection fraction 15-20%. No obvious clot was noted.  Hypokalemia, Potassium supplementation   Hospital day # 16  TREATMENT/PLAN  Given the patient's inability to swallow, we typically do not restart Coumadin or Plavix until swallow is expected to return. Should J-tube be needed, will have to wait 5 days after the last Plavix dose as well as wait for  Coumadin to reverse prior to being able to place.  LTACH placement anticipated  OOB, therapy evals ordered  Dr. Pearlean BrownieSethi to discuss with Dr. Brita RompMcLean  SHARON BIBY, MSN, RN, ANVP-BC, ANP-BC, GNP-BC [facility] Pager: [phone] 08/02/2013 12:38 PM  I have personally obtained a history, examined the patient, evaluated imaging results, and formulated the assessment and plan of care. I agree with the above. Delia HeadyPramod Sethi, MD

## 2013-08-02 NOTE — Progress Notes (Signed)
SLP Cancellation Note  Patient Details Name: [name] MRN: [id number] DOB: 1961-02-03   Cancelled treatment:       Reason Eval/Treat Not Completed: Medical issues which prohibited therapy. New orders received. Per RN, patient did not tolerate cuff deflation this am which will impact both PMSV tolerance and likely swallowing function based on severity of dysphagia noted on initial exam. Will f/u 2/10.   Ferdinand Lango MA, CCC-SLP [phone]  [name] [name] 08/02/2013, 4:06 PM

## 2013-08-02 NOTE — Progress Notes (Signed)
ANTICOAGULATION CONSULT NOTE - Follow Up Consult  Pharmacy Consult for Heparin, resume Coumadin Indication: LVAD thrombosis/CVA    Allergies  Allergen Reactions  . Ace Inhibitors Cough  . Lexapro [Escitalopram Oxalate] Other (See Comments)    somnolence    Patient Measurements: Height: 5\' 4"  (162.6 cm) Weight: 188 lb 4.8 oz (85.412 kg) IBW/kg (Calculated) : 59.2 Heparin Dosing Weight: 77kg  Vital Signs: Temp: 98.9 F (37.2 C) (02/09 0800) Temp src: Oral (02/09 0800) BP: 101/85 mmHg (02/09 0827) Pulse Rate: 108 (02/09 0827)  Labs:  Recent Labs  07/31/13 0500 08/01/13 0415 08/02/13 0430  HGB 9.7* 9.3* 8.9*  HCT 29.8* 28.7* 27.9*  PLT 386 422* 412*  LABPROT 15.9* 15.9* 17.8*  INR 1.30 1.30 1.51*  HEPARINUNFRC 0.42 0.36 0.41  CREATININE 0.77 0.88 0.87    Estimated Creatinine Clearance: 100.1 ml/min (by C-G formula based on Cr of 0.87).  . sodium chloride 20 mL/hr at 08/02/13 0800  . heparin 1,200 Units/hr (08/01/13 2300)  . nitroGLYCERIN Stopped (07/17/13 1800)    Assessment: 52yom with LVAD, suspected pump thrombosis and acute CVA continues on heparin bridge to warfarin. Heparin level remains therapeutic at 0.41 heaprin drip 1200 uts/hr. H/H drifting down 9.7/29.8>8.9/27.9, Plts stable. No bleeding reported, now with clear secretions. Patient at high risk for PEG or J-tube, Panda tube continued.  INR 1.5 -starting to bump up after Coumadin 10mg  last pm - will repeat.  Patient now off amio (1/27-2/6)  Goal of Therapy:  Heparin level 0.3-0.5 units/ml Monitor platelets by anticoagulation protocol: Yes INR 2.0-3.0   Plan:  Continue heparin drip 1200 units/hr (12 ml/hr) Will continue to monitor bleeding Coumadin 10mg  x 1 tonight Daily heparin level, CBC, and INR   Leota Sauers Pharm.D. CPP, BCPS Clinical Pharmacist [phone] 08/02/2013 8:46 AM

## 2013-08-02 NOTE — Progress Notes (Signed)
Physical Therapy Treatment Patient Details Name: Eric LovelyUndra [name] MRN: [id number] DOB: 07/11/1960 Today's Date: 08/02/2013 Time: [id number] PT Time Calculation (min): 39 min  PT Assessment / Plan / Recommendation  History of Present Illness [name] is 53 y/o male with a history of severe CHF due to NICM EF 10-15%, and severe MR s/p HM II [facility]. His medical history also consists of CRI, NSVT s/p ICD and LV thrombus. Admitted 1/24 with NSTEMI felt secondary due embolic phenomenon due to pump thrombosis.  LDH high around 900.  Extubated 1/30. Reintubated 2/2 due to inability control secretions. Trach 2/4 and on trach collar this am. Smiling and denies any pain. MAPs 80-90s on levophed gtt 2 mcg. No further AF on amio. Pending PEG today - heparin on hold. Starting to move L side minimally.    PT Comments   Making gains each session.  Today needed much less control at L knee and upper trunk.  Secretion still thick and bloody.  Pt unable to manage oral secretions.   Follow Up Recommendations  LTACH     Does the patient have the potential to tolerate intense rehabilitation     Barriers to Discharge        Equipment Recommendations       Recommendations for Other Services    Frequency Min 3X/week   Progress towards PT Goals Progress towards PT goals: Progressing toward goals  Plan Current plan remains appropriate    Precautions / Restrictions Precautions Precautions: Fall   Pertinent Vitals/Pain  HR in th 100's/110's max 119 bpm;  sats 94/95%.    Mobility  Bed Mobility Overal bed mobility: Needs Assistance;+2 for physical assistance Bed Mobility: Supine to Sit Supine to sit: Mod assist General bed mobility comments: cues for guidance/sequencing, truncal assist Transfers Overall transfer level: Needs assistance Transfers: Sit to/from Stand;Stand Pivot Transfers Sit to Stand: Mod assist;+2 physical assistance (times 3) Stand pivot transfers: Mod assist;+2 physical  assistance General transfer comment: assist to come forward and assist with lift, mild control of L knee, need for less truncal control. Modified Rankin (Stroke Patients Only) Pre-Morbid Rankin Score: No symptoms Modified Rankin: Severe disability    Exercises     PT Diagnosis:    PT Problem List:   PT Treatment Interventions:     PT Goals (current goals can now be found in the care plan section) Acute Rehab PT Goals PT Goal Formulation: With patient Time For Goal Achievement: 08/13/13 Potential to Achieve Goals: Good  Visit Information  Last PT Received On: 08/02/13 Assistance Needed: +2 History of Present Illness: [name] is 53 y/o male with a history of severe CHF due to NICM EF 10-15%, and severe MR s/p HM [facility]. His medical history also consists of CRI, NSVT s/p ICD and LV thrombus. Admitted 1/24 with NSTEMI felt secondary due embolic phenomenon due to pump thrombosis.  LDH high around 900.  Extubated 1/30. Reintubated 2/2 due to inability control secretions. Trach 2/4 and on trach collar this am. Smiling and denies any pain. MAPs 80-90s on levophed gtt 2 mcg. No further AF on amio. Pending PEG today - heparin on hold. Starting to move L side minimally.     Subjective Data  Subjective: pt nodding appropriately to questions   Cognition  Cognition Arousal/Alertness: Awake/alert Behavior During Therapy: WFL for tasks assessed/performed Overall Cognitive Status: Within Functional Limits for tasks assessed    Balance  Balance Overall balance assessment: Needs assistance Sitting-balance  support: No upper extremity supported Sitting balance-Leahy Scale: Good Sitting balance - Comments: handling challenge when picking up his feet to clean off Standing balance support: Bilateral upper extremity supported Standing balance-Leahy Scale: Fair Standing balance comment: standing times 3 from 25 secs to 1.5 minutes working on w/shift, upright stance, unweighting  each foot  End of Session PT - End of Session Equipment Utilized During Treatment: Oxygen;Other (comment) (via trach collar) Activity Tolerance: Patient tolerated treatment well;Patient limited by fatigue Patient left: with call bell/phone within reach;with nursing/sitter in room;in chair Nurse Communication: Mobility status   GP     [name], [name] 08/02/2013, 12:04 PM 08/02/2013  [name], PT [phone]  (pager)

## 2013-08-02 NOTE — Progress Notes (Signed)
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 He is s/p LVAD placement at [facility] March 2013, as bridge to transplant. Admitted for evaluation of new NSTEMI felt possibly to be related to pump thrombosis. Hosp course complicated by episodes of CP and hypotension on 1/24 pm hours. 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 PCCM asked to see post-op for vent weaning.    SIGNIFICANT EVENTS / STUDIES:  1/24: admitted w/ CP and trop rise. Did have sub-therapeutic INR at home so concern was for pump thrombosis.  1/24: evening had CP, rising Trop I, got mso4 and NTG>>>got hypotensive requiring pressors.  1/25 INR supra-therapeutic. Coumadin stopped, heparin started 1/26: CT head: negative for bleed.  1/26:cerebral arteriogram:  acute right MCA embolic CVA. Not amendable to extraction due to distal location.  1/30 Extubated but airway protection is questionable at best 2/01 Reintubated 2/03 ENT eval Jearld Fenton) clots suctioned from mouth - no active bleeding site 2/05 Off vent  LINES / TUBES: LVAD OETT 1/26>>>1/30, 2/1 >>2/4 Trach 2/4 >> Right PICC 1/25>>>  CULTURES: Resp 1/27 >> few GNR >> normal flora  ANTIBIOTICS: 1/27 zosyn (aspiration, GNR) >> 2/5  SUBJECTIVE:  Still has respiratory secretions, but no difficulty coughing.  VITAL SIGNS: Temp:  [98 F (36.7 C)-100.1 F (37.8 C)] 98.9 F (37.2 C) (02/09 0800) Pulse Rate:  [97-113] 108 (02/09 0827) Resp:  [12-28] 25 (02/09 0827) BP: (87-106)/(38-90) 101/85 mmHg (02/09 0827) SpO2:  [96 %-100 %] 99 % (02/09 0827) Arterial Line BP: (85-110)/(74-98) 106/86 mmHg (02/09 0800) FiO2 (%):  [28 %] 28 % (02/09 0827) Weight:  [188 lb 4.8  oz (85.412 kg)] 188 lb 4.8 oz (85.412 kg) (02/09 0500) HEMODYNAMICS: CVP:  [0 mmHg-15 mmHg] 15 mmHg VENTILATOR SETTINGS: Vent Mode:  [-]  FiO2 (%):  [28 %] 28 % INTAKE / OUTPUT: Intake/Output     02/08 0701 - 02/09 0700 02/09 0701 - 02/10 0700   I.V. (mL/kg) 778 (9.1) 32 (0.4)   NG/GT 1200 40   Total Intake(mL/kg) 1978 (23.2) 72 (0.8)   Urine (mL/kg/hr) 1720 (0.8) 125 (0.6)   Stool 1 (0)    Total Output 1721 125   Net +257 -53         PHYSICAL EXAMINATION: General:  No distress Neuro: interactive, LUE weakness 3/5 HEENT: trach in place, clear secretions with blood streaking Cardiovascular:  LVAD pump hum. No edema  Lungs:  Coarse BS, no wheeze Abdomen:  Soft, nontender  Musculoskeletal:  Intact  Skin:  Intact   LABS:  CBC  Recent Labs Lab 07/31/13 0500 08/01/13 0415 08/02/13 0430  WBC 7.6 10.7* 9.3  HGB 9.7* 9.3* 8.9*  HCT 29.8* 28.7* 27.9*  PLT 386 422* 412*   Coag's  Recent Labs Lab 07/27/13 2110  07/31/13 0500 08/01/13 0415 08/02/13 0430  APTT 39*  --   --   --   --   INR 1.52*  < > 1.30 1.30 1.51*  < > = values in this interval not displayed. BMET  Recent Labs Lab 07/31/13 0500 08/01/13 0415 08/02/13 0430  NA 144 143 142  K 3.5* 3.4* 3.6*  CL 109 107 108  CO2 24 24  24  BUN 13 16 17   CREATININE 0.77 0.88 0.87  GLUCOSE 126* 124* 127*   Electrolytes  Recent Labs Lab 07/31/13 0500 08/01/13 0415 08/02/13 0430  CALCIUM 8.5 8.6 8.5   Glucose  Recent Labs Lab 08/01/13 1139 08/01/13 1709 08/01/13 1946 08/02/13 0021 08/02/13 0430 08/02/13 0731  GLUCAP 94 96 105* 111* 115* 121*    Imaging No results found.  ASSESSMENT / PLAN:  PULMONARY A: Acute respiratory failure 2nd to Rt MCA-CVA and difficulty with airway secretions.  Re-intubated 2/01. Bloody oral secretions ?from traumatic intubation 2/01 -no active bleed on ENT exam/directlaryngoscopy.  S/p tracheostomy >> off vent since 2/05. P:   -ATC as tolerated -Speech therapy  to assess for PM valve -Robinul - good response, can increase to tid prn if needed  CARDIOVASCULAR A:  NSTEMI (felt to be Pump thrombosis) Acute on Chronic systolic HF (EF 10-15%) w/ [facility].  Cardiogenic shock >> resolved. P - LVAD per cards, co-ox improving -Asa, plavix per cards  -heparin resumed 2/4  RENAL A:   AKI in setting of hypoperfusion, s/p contrast load for cerebral arteriogram >> resolved. Hypokalemia. P:   -F/u chemistry  -replete K  GASTROINTESTINAL A:   Nutrition -high risk for PEG or J-tube. P:   -PPI for SUP -resumed tube feeds with panda -bowel regimen -speech therapy to assess swallowing  HEMATOLOGIC A:   Chronic anticoagulation (on coumadin for LVAD) >> Concern about acute pump related embolic events. Anemia of chronic disease. P:  -Heparin/coumadin per pharmacy  -f/u CBC  INFECTIOUS A:   ? aspiration pneumonia >> Sputum cx negative. P:   -observe off abx  ENDOCRINE A:   Mild hyperglycemia. P:   -SSI if glucose > 150.  NEUROLOGIC A:   Right MCA embolic CVA.  P:   -per neuro -aggressive PT/OT  Updated family at bedside.  Coralyn HellingVineet Tyrik Stetzer, MD [facility] Pulmonary/Critical Care 08/02/2013, 9:45 AM Pager:  [phone] After 3pm call: [phone]

## 2013-08-03 DIAGNOSIS — R509 Fever, unspecified: Secondary | ICD-10-CM

## 2013-08-03 DIAGNOSIS — I6350 Cerebral infarction due to unspecified occlusion or stenosis of unspecified cerebral artery: Secondary | ICD-10-CM

## 2013-08-03 DIAGNOSIS — I214 Non-ST elevation (NSTEMI) myocardial infarction: Secondary | ICD-10-CM

## 2013-08-03 DIAGNOSIS — I6340 Cerebral infarction due to embolism of unspecified cerebral artery: Secondary | ICD-10-CM

## 2013-08-03 DIAGNOSIS — R4182 Altered mental status, unspecified: Secondary | ICD-10-CM

## 2013-08-03 LAB — CARBOXYHEMOGLOBIN
Carboxyhemoglobin: 2 % — ABNORMAL HIGH (ref 0.5–1.5)
Methemoglobin: 0.6 % (ref 0.0–1.5)
O2 Saturation: 66.7 %
TOTAL HEMOGLOBIN: 9.1 g/dL — AB (ref 13.5–18.0)

## 2013-08-03 LAB — GLUCOSE, CAPILLARY
GLUCOSE-CAPILLARY: 107 mg/dL — AB (ref 70–99)
GLUCOSE-CAPILLARY: 118 mg/dL — AB (ref 70–99)
GLUCOSE-CAPILLARY: 98 mg/dL (ref 70–99)
Glucose-Capillary: 114 mg/dL — ABNORMAL HIGH (ref 70–99)
Glucose-Capillary: 128 mg/dL — ABNORMAL HIGH (ref 70–99)
Glucose-Capillary: 97 mg/dL (ref 70–99)
Glucose-Capillary: 104 mg/dL — ABNORMAL HIGH (ref 70–99)

## 2013-08-03 LAB — LACTATE DEHYDROGENASE: LDH: 753 U/L — ABNORMAL HIGH (ref 94–250)

## 2013-08-03 LAB — PROTIME-INR
INR: 1.96 — AB (ref 0.00–1.49)
Prothrombin Time: 21.7 seconds — ABNORMAL HIGH (ref 11.6–15.2)

## 2013-08-03 LAB — BASIC METABOLIC PANEL
BUN: 20 mg/dL (ref 6–23)
CHLORIDE: 107 meq/L (ref 96–112)
CO2: 26 meq/L (ref 19–32)
Calcium: 8.5 mg/dL (ref 8.4–10.5)
Creatinine, Ser: 0.82 mg/dL (ref 0.50–1.35)
GFR calc Af Amer: >90 mL/min (ref >90)
GFR calc non Af Amer: >90 mL/min (ref >90)
Glucose, Bld: 117 mg/dL — ABNORMAL HIGH (ref 70–99)
Potassium: 3.4 mEq/L — ABNORMAL LOW (ref 3.7–5.3)
Sodium: 145 mEq/L (ref 137–147)

## 2013-08-03 LAB — CBC
HCT: 27.2 % — ABNORMAL LOW (ref 39.0–52.0)
Hemoglobin: 8.7 g/dL — ABNORMAL LOW (ref 13.0–17.0)
MCH: 29.4 pg (ref 26.0–34.0)
MCHC: 32 g/dL (ref 30.0–36.0)
MCV: 91.9 fL (ref 78.0–100.0)
PLATELETS: 434 10*3/uL — AB (ref 150–400)
RBC: 2.96 MIL/uL — ABNORMAL LOW (ref 4.22–5.81)
RDW: 16.4 % — AB (ref 11.5–15.5)
WBC: 8.9 10*3/uL (ref 4.0–10.5)

## 2013-08-03 LAB — HEPARIN LEVEL (UNFRACTIONATED): HEPARIN UNFRACTIONATED: 0.39 [IU]/mL (ref 0.30–0.70)

## 2013-08-03 MED ORDER — PRO-STAT SUGAR FREE PO LIQD
30.0000 mL | Freq: Four times a day (QID) | ORAL | Status: DC
  Administered 2013-08-03 – 2013-08-11 (×32): 30 mL
  Filled 2013-08-03 (×35): qty 30

## 2013-08-03 MED ORDER — FUROSEMIDE 10 MG/ML IJ SOLN
40.0000 mg | Freq: Once | INTRAMUSCULAR | Status: AC
  Administered 2013-08-03: 40 mg via INTRAVENOUS
  Filled 2013-08-03: qty 4

## 2013-08-03 MED ORDER — SPIRONOLACTONE 25 MG PO TABS
25.0000 mg | ORAL_TABLET | Freq: Every day | ORAL | Status: DC
  Administered 2013-08-03 – 2013-08-11 (×9): 25 mg via ORAL
  Filled 2013-08-03 (×9): qty 1

## 2013-08-03 MED ORDER — OSMOLITE 1.5 CAL PO LIQD
1000.0000 mL | ORAL | Status: DC
  Administered 2013-08-04 – 2013-08-11 (×5): 1000 mL
  Filled 2013-08-03 (×12): qty 1000

## 2013-08-03 MED ORDER — POTASSIUM CHLORIDE 20 MEQ/15ML (10%) PO LIQD
40.0000 meq | Freq: Once | ORAL | Status: AC
  Administered 2013-08-03: 40 meq via ORAL
  Filled 2013-08-03: qty 30

## 2013-08-03 MED ORDER — WARFARIN SODIUM 5 MG PO TABS
5.0000 mg | ORAL_TABLET | Freq: Once | ORAL | Status: AC
  Administered 2013-08-03: 5 mg via ORAL
  Filled 2013-08-03: qty 1

## 2013-08-03 NOTE — Consult Note (Signed)
Physical Medicine and Rehabilitation Consult  Reason for Consult: Left hemiparesis, severe dysphagia, trach dependent, s/p LVAD Referring Physician: Dr. Gala Romney   HPI: [name] is a 53 y.o. male  w/ known h/o NICM EF 10-15% ,severe MR. He is s/p LVAD placement at [facility] March 2013, as bridge to transplant. He was admitted for evaluation of new NSTEMI felt possibly to be related to pump thrombosis. Hosp course complicated by episodes of CP and hypotension on 1/24 pm hours requiring pressors. He . 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  Aspiration PNA treated with zosyn. Extubated on 06/3013 but has difficulty handling secretions and reintubated on 07/25/13.  He developed oropharyngeal bleeding and ENT evaluations did not reveal source of acute bleeding.  Trach placed by Dr. Brooks Sailors on 07/28/13 and patient tolerating trach collar. He is NPO due to sever dysphagia but deemed too high risk for PEG placement by IR and CCS and tolerating tube feeds per NGT. Unable to tolerate PMSV trial yesterday. Is starting to have minimal movement on left. OT, MD recommending CIR.     Review of Systems  Unable to perform ROS: language  Eyes: Negative for blurred vision and double vision.  Respiratory: Positive for cough and sputum production.   Cardiovascular: Negative for chest pain.  Neurological: Negative for dizziness and headaches.    Past Medical History  Diagnosis Date  . CHF (congestive heart failure)     EF- 10-15  . Medically noncompliant   . Mitral regurgitation   . Tobacco user   . HTN (hypertension)   . AICD (automatic cardioverter/defibrillator) present   . GERD (gastroesophageal reflux disease)   . Substance abuse   . Chronic renal insufficiency   . Syncope   . Thrombus 08/06/2010  . SYSTOLIC HEART FAILURE, CHRONIC 09/22/2008    Qualifier: Diagnosis of  By: Gala Romney, MD, Trixie Dredge   . LV  (left ventricular) mural thrombus 01/28/2011  . ICD - IN SITU 09/16/2008    Qualifier: Diagnosis of  By: Wonda Amis    . MITRAL STENOSIS/ INSUFFICIENCY, NON-RHEUMATIC 09/22/2008    Qualifier: Diagnosis of  By: Gala Romney, MD, Trixie Dredge Hepatomegaly 09/16/2008    Qualifier: Diagnosis of  By: Wonda Amis    . High cholesterol 02/26/2012    "at one time"  . Sleep apnea   . Exertional dyspnea 02/26/2012  . History of blood transfusion 08/2011    "when I had heart pump"  . Migraines   . COMMON MIGRAINE 06/14/2009    Qualifier: Diagnosis of  By: Jonny Ruiz MD, Len Blalock   . History of gout 02/26/2012  . Depression   . Bipolar affective disorder 10/22/2011    pt denies this hx 02/26/2012   Past Surgical History  Procedure Laterality Date  . Cardiac defibrillator placement  ~ 2008  . Left ventricular assist device  08/2011  . Radiology with anesthesia N/A 07/19/2013    Procedure: RADIOLOGY WITH ANESTHESIA;  Surgeon: Oneal Grout, MD;  Location: [facility] OR;  Service: Radiology;  Laterality: N/A;  . Tracheostomy      feinstein    Family History  Problem Relation Age of Onset  . Coronary artery disease Neg Hx     Social History:  Single but has a supportive girlfriend and friends. Used to work as a Education administrator till 2 years ago. Per reports that he quit smoking about  2 years ago. His smoking use included Cigarettes. He has a 8.25 pack-year smoking history. He quit smokeless tobacco use about 2 years ago. Per  reports that he does not drink alcohol or use illicit drugs.   Allergies  Allergen Reactions  . Ace Inhibitors Cough  . Lexapro [Escitalopram Oxalate] Other (See Comments)    somnolence   Medications Prior to Admission  Medication Sig Dispense Refill  . amLODipine (NORVASC) 10 MG tablet Take 10 mg by mouth daily.      Marland Kitchen aspirin EC 81 MG tablet Take 81 mg by mouth daily.      . carvedilol (COREG) 25 MG tablet Take 50 mg by mouth 2 (two) times daily with a meal.      .  clopidogrel (PLAVIX) 75 MG tablet Take 1 tablet (75 mg total) by mouth daily with breakfast.  30 tablet  4  . gabapentin (NEURONTIN) 300 MG capsule Take 300 mg by mouth 3 (three) times daily.      . hydrALAZINE (APRESOLINE) 100 MG tablet Take 100 mg by mouth 3 (three) times daily.      Marland Kitchen losartan (COZAAR) 100 MG tablet Take 100 mg by mouth daily.      . magnesium oxide (MAG-OX) 400 MG tablet Take 400 mg by mouth daily.      . potassium chloride (K-DUR,KLOR-CON) 10 MEQ tablet Take 20 mEq by mouth daily.      . ranitidine (ZANTAC) 150 MG tablet Take 150 mg by mouth 2 (two) times daily.       Marland Kitchen torsemide (DEMADEX) 20 MG tablet Take 2 tablets (40 mg total) by mouth daily.  30 tablet  3  . warfarin (COUMADIN) 5 MG tablet Take 5 mg by mouth daily.       Marland Kitchen allopurinol (ZYLOPRIM) 100 MG tablet Take 100 mg by mouth as needed. For gout        Home: Home Living Family/patient expects to be discharged to:: Unsure Living Arrangements: Other relatives  Functional History:   Functional Status:  Mobility:          ADL: ADL Eating/Feeding: NPO Grooming: Wash/dry hands;Teeth care;Min guard Where Assessed - Grooming: Unsupported sitting (in chair) Upper Body Bathing: Maximal assistance Where Assessed - Upper Body Bathing: Supported sitting Lower Body Bathing: Maximal assistance Where Assessed - Lower Body Bathing: Supported sit to stand Upper Body Dressing: Maximal assistance Where Assessed - Upper Body Dressing: Unsupported sit to stand Lower Body Dressing: +1 Total assistance Where Assessed - Lower Body Dressing: Supported sit to Pharmacist, hospital: Moderate assistance Toilet Transfer Method: Sit to stand;Stand pivot Toilet Transfer Equipment: Bedside commode Transfers/Ambulation Related to ADLs: mod A for transfer  Cognition: Cognition Overall Cognitive Status: Difficult to assess Orientation Level: Intubated/Tracheostomy - Unable to assess Cognition Arousal/Alertness:  Awake/alert Behavior During Therapy: Flat affect Overall Cognitive Status: Difficult to assess Difficult to assess due to: Tracheostomy  Blood pressure 104/92, pulse 101, temperature 98.1 F (36.7 C), temperature source Oral, resp. rate 17, height 5\' 4"  (1.626 m), weight 85 kg (187 lb 6.3 oz), SpO2 99.00%. Physical Exam  Nursing note and vitals reviewed. Constitutional: He appears well-developed and well-nourished.  With cuffed trach #8 with gurgling sounds.   HENT:  Head: Atraumatic.  Left facial fullness noted.   Eyes: Conjunctivae are normal. Pupils are equal, round, and reactive to light.  Neck:  Trach in place with pinkish frothy secretions. XL trach in place..   Cardiovascular:  +Hum. Heart rate 113.  Respiratory: No  respiratory distress. He has no wheezes.  Congested productive cough--does not phonate.   GI: Soft. He exhibits no distension. There is no tenderness.  Musculoskeletal:  Edema left hand.   Neurological: He is alert.  Smiles and makes good eye contact. able to open mouth to command and protrudes tongue..   Able to follow basic commands. Moves right side without difficulty. Left UE (0 to trace) > LE weakness (1+ to 2- ) with mild sensory deficits (does sense pain). Left foot with clonus. Nods "yes" to most questions---not sure how much he really understands.  Skin: Skin is warm and dry.    Results for orders placed during the hospital encounter of 07/17/13 (from the past 24 hour(s))  GLUCOSE, CAPILLARY     Status: None   Collection Time    08/02/13  4:13 PM      Result Value Range   Glucose-Capillary 99  70 - 99 mg/dL  GLUCOSE, CAPILLARY     Status: None   Collection Time    08/02/13  8:15 PM      Result Value Range   Glucose-Capillary 99  70 - 99 mg/dL  GLUCOSE, CAPILLARY     Status: Abnormal   Collection Time    08/03/13 12:07 AM      Result Value Range   Glucose-Capillary 118 (*) 70 - 99 mg/dL  GLUCOSE, CAPILLARY     Status: Abnormal   Collection Time     08/03/13  4:25 AM      Result Value Range   Glucose-Capillary 114 (*) 70 - 99 mg/dL  CARBOXYHEMOGLOBIN     Status: Abnormal   Collection Time    08/03/13  4:30 AM      Result Value Range   Total hemoglobin 9.1 (*) 13.5 - 18.0 g/dL   O2 Saturation 16.1     Carboxyhemoglobin 2.0 (*) 0.5 - 1.5 %   Methemoglobin 0.6  0.0 - 1.5 %  CBC     Status: Abnormal   Collection Time    08/03/13  4:34 AM      Result Value Range   WBC 8.9  4.0 - 10.5 K/uL   RBC 2.96 (*) 4.22 - 5.81 MIL/uL   Hemoglobin 8.7 (*) 13.0 - 17.0 g/dL   HCT 09.6 (*) 04.5 - 40.9 %   MCV 91.9  78.0 - 100.0 fL   MCH 29.4  26.0 - 34.0 pg   MCHC 32.0  30.0 - 36.0 g/dL   RDW 81.1 (*) 91.4 - 78.2 %   Platelets 434 (*) 150 - 400 K/uL  LACTATE DEHYDROGENASE     Status: Abnormal   Collection Time    08/03/13  4:34 AM      Result Value Range   LDH 753 (*) 94 - 250 U/L  PROTIME-INR     Status: Abnormal   Collection Time    08/03/13  4:34 AM      Result Value Range   Prothrombin Time 21.7 (*) 11.6 - 15.2 seconds   INR 1.96 (*) 0.00 - 1.49  BASIC METABOLIC PANEL     Status: Abnormal   Collection Time    08/03/13  4:34 AM      Result Value Range   Sodium 145  137 - 147 mEq/L   Potassium 3.4 (*) 3.7 - 5.3 mEq/L   Chloride 107  96 - 112 mEq/L   CO2 26  19 - 32 mEq/L   Glucose, Bld 117 (*) 70 - 99 mg/dL  BUN 20  6 - 23 mg/dL   Creatinine, Ser 1.610.82  0.50 - 1.35 mg/dL   Calcium 8.5  8.4 - 09.610.5 mg/dL   GFR calc non Af Amer >90  >90 mL/min   GFR calc Af Amer >90  >90 mL/min  HEPARIN LEVEL (UNFRACTIONATED)     Status: None   Collection Time    08/03/13  4:34 AM      Result Value Range   Heparin Unfractionated 0.39  0.30 - 0.70 IU/mL  GLUCOSE, CAPILLARY     Status: Abnormal   Collection Time    08/03/13  7:14 AM      Result Value Range   Glucose-Capillary 128 (*) 70 - 99 mg/dL   Comment 1 Notify RN    GLUCOSE, CAPILLARY     Status: None   Collection Time    08/03/13 12:43 PM      Result Value Range    Glucose-Capillary 98  70 - 99 mg/dL   No results found.  Assessment/Plan: Diagnosis: Embolic bilateral BG CVA's, left thalamus CVA, CHF 1. Does the need for close, 24 hr/day medical supervision in concert with the patient's rehab needs make it unreasonable for this patient to be served in a less intensive setting? Potentially 2. Co-Morbidities requiring supervision/potential complications: LVAD, trach 3. Due to bladder management, bowel management, safety, skin/wound care, disease management, medication administration, pain management and patient education, does the patient require 24 hr/day rehab nursing? Potentially 4. Does the patient require coordinated care of a physician, rehab nurse, PT (1-2 hrs/day, 5 days/week), OT (1-2 hrs/day, 5 days/week) and SLP (1-2 hrs/day, 5 days/week) to address physical and functional deficits in the context of the above medical diagnosis(es)? Potentially Addressing deficits in the following areas: balance, endurance, locomotion, strength, transferring, bowel/bladder control, bathing, dressing, feeding, grooming, toileting, cognition, speech, language, swallowing and psychosocial support 5. Can the patient actively participate in an intensive therapy program of at least 3 hrs of therapy per day at least 5 days per week? No 6. The potential for patient to make measurable gains while on inpatient rehab is fair and poor 7. Anticipated functional outcomes upon discharge from inpatient rehab are n/a with PT, n/a with OT, n/a with SLP. 8. Estimated rehab length of stay to reach the above functional goals is: n/a 9. Does the patient have adequate social supports to accommodate these discharge functional goals? Potentially 10. Anticipated D/C setting: other 11. Anticipated post D/C treatments: other 12. Overall Rehab/Functional Prognosis: good and fair  RECOMMENDATIONS: This patient's condition is appropriate for continued rehabilitative care in the following setting:  LTACH Patient has agreed to participate in recommended program. Potentially Note that insurance prior authorization may be required for reimbursement for recommended care.  Comment: I just see too many medical obstacles which will interfere in therapy participation  (dense left HP, trach, foley, LVAD) to allow realistic participation in 3-4 hours of therapy per day. I think he would be best [facility] where some of these medical issues could be addressed in a lower intensity rehab environment. From there we could consider transition to CIR. Will follow along for now.   Ranelle OysterZachary T. Swartz, MD, [facility] Physical Medicine & Rehabilitation     08/03/2013

## 2013-08-03 NOTE — Progress Notes (Signed)
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 He is [facility], as bridge to transplant. Admitted for evaluation of new NSTEMI felt possibly to be related to pump thrombosis. Hosp course complicated by episodes of CP and hypotension on 1/24 pm hours. 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 PCCM asked to see post-op for vent weaning.    SIGNIFICANT EVENTS / STUDIES:  1/24: admitted w/ CP and trop rise. Did have sub-therapeutic INR at home so concern was for pump thrombosis.  1/24: evening had CP, rising Trop I, got mso4 and NTG>>>got hypotensive requiring pressors.  1/25 INR supra-therapeutic. Coumadin stopped, heparin started 1/26: CT head: negative for bleed.  1/26:cerebral arteriogram:  acute right MCA embolic CVA. Not amendable to extraction due to distal location.  1/30 Extubated but airway protection is questionable at best 2/01 Reintubated 2/03 ENT eval Jearld Fenton([place]ers) clots suctioned from mouth - no active bleeding site 2/05 Off vent  LINES / TUBES: LVAD OETT 1/26>>>1/30, 2/1 >>2/4 Trach (DF) 2/4 >> Right PICC 1/25>>>  CULTURES: Resp 1/27 >> few GNR >> normal flora  ANTIBIOTICS: 1/27 zosyn (aspiration, GNR) >> 2/5  SUBJECTIVE:  Fever x 1 yesterday.  Intermittent secretions still.  Had difficulty with cuff deflation yesterday.  VITAL SIGNS: Temp:  [98.6 F (37 C)-101.2 F (38.4 C)] 98.6 F (37 C) (02/10 0717) Pulse Rate:  [99-119] 108 (02/10 0800) Resp:  [15-29] 25 (02/10 0800) BP: (99-104)/(80-92) 104/92 mmHg (02/09 1915) SpO2:  [95 %-100 %] 95 % (02/10 0800) Arterial Line BP: (93-147)/(79-95) 93/87 mmHg (02/10 0800) FiO2 (%):  [28 %]  28 % (02/10 0800) Weight:  [187 lb 6.3 oz (85 kg)] 187 lb 6.3 oz (85 kg) (02/10 0500) HEMODYNAMICS: CVP:  [10 mmHg-16 mmHg] 15 mmHg TC 28% INTAKE / OUTPUT: Intake/Output     02/09 0701 - 02/10 0700 02/10 0701 - 02/11 0700   I.V. (mL/kg) 788 (9.3) 44 (0.5)   NG/GT 1460 40   Total Intake(mL/kg) 2248 (26.4) 84 (1)   Urine (mL/kg/hr) 1560 (0.8) 50 (0.3)   Stool     Total Output 1560 50   Net +688 +34        Urine Occurrence 1 x    Stool Occurrence 1 x     PHYSICAL EXAMINATION: General:  No distress Neuro: alert, follows commands HEENT: trach in place, clear secretions, tolerating trach cuff deflation Cardiovascular:  LVAD pump hum. No edema  Lungs:  Coarse BS, no wheeze Abdomen:  Soft, nontender  Musculoskeletal:  Intact  Skin:  Intact   LABS:  CBC  Recent Labs Lab 08/01/13 0415 08/02/13 0430 08/03/13 0434  WBC 10.7* 9.3 8.9  HGB 9.3* 8.9* 8.7*  HCT 28.7* 27.9* 27.2*  PLT 422* 412* 434*   Coag's  Recent Labs Lab 07/27/13 2110  08/01/13 0415 08/02/13 0430 08/03/13 0434  APTT 39*  --   --   --   --   INR 1.52*  < > 1.30 1.51* 1.96*  < > = values in this interval not displayed.  BMET  Recent Labs Lab 08/01/13 0415 08/02/13 0430 08/03/13 0434  NA 143 142 145  K 3.4* 3.6*  3.4*  CL 107 108 107  CO2 24 24 26   BUN 16 17 20   CREATININE 0.88 0.87 0.82  GLUCOSE 124* 127* 117*   Electrolytes  Recent Labs Lab 08/01/13 0415 08/02/13 0430 08/03/13 0434  CALCIUM 8.6 8.5 8.5   Glucose  Recent Labs Lab 08/02/13 1209 08/02/13 1613 08/02/13 2015 08/03/13 0007 08/03/13 0425 08/03/13 0714  GLUCAP 125* 99 99 118* 114* 128*    Imaging No results found.  ASSESSMENT / PLAN:  PULMONARY A: Acute respiratory failure 2nd to Rt MCA-CVA and difficulty with airway secretions.  Re-intubated 2/01. Bloody oral secretions ?from traumatic intubation 2/01 -no active bleed on ENT exam/directlaryngoscopy.  S/p tracheostomy >> off vent since 2/05. P:   -ATC as  tolerated -defer downsizing trach until later this week or early next week to ensure mature stoma -Speech therapy to assess for PM valve >> likely will do better once trach downsized. -Robinul - good response, can increase to tid prn if needed -f/u CXR 2/11  CARDIOVASCULAR A:  NSTEMI (felt to be Pump thrombosis) Acute on Chronic systolic HF (EF 10-15%) w/ LVAD placed in march 2013 at [facility].  Cardiogenic shock >> resolved. P - LVAD per cards, co-ox improving -Asa, plavix per cards  -heparin resumed 2/4  RENAL A:   AKI in setting of hypoperfusion, s/p contrast load for cerebral arteriogram >> resolved. Hypokalemia. P:   -F/u chemistry  -replete K  GASTROINTESTINAL A:   Nutrition -high risk for PEG or J-tube. P:   -PPI for SUP -resumed tube feeds with panda -bowel regimen -speech therapy to assess swallowing >> may need to defer until trach can be downsized.  HEMATOLOGIC A:   Chronic anticoagulation (on coumadin for LVAD) >> Concern about acute pump related embolic events. Anemia of chronic disease. P:  -Heparin/coumadin per pharmacy  -f/u CBC  INFECTIOUS A:   ? aspiration pneumonia >> Sputum cx negative. Fever 2/09 >> no obvious source of infection. P:   -if he has recurrent fever, then consider sending cultures and adding Abx  ENDOCRINE A:   Mild hyperglycemia. P:   -SSI if glucose > 150.  NEUROLOGIC A:   Right MCA embolic CVA.  P:   -per neuro -aggressive PT/OT  Coralyn Helling, MD [facility] Pulmonary/Critical Care 08/03/2013, 9:17 AM Pager:  [phone] After 3pm call: [phone]

## 2013-08-03 NOTE — Evaluation (Signed)
[facility] - Evaluation Patient Details  Name: [name] MRN: [id number] Date of Birth: 12/18/1960  Today's Date: 08/03/2013 Time: 1502-1520 SLP Time Calculation (min): 18 min  Past Medical History:  Past Medical History  Diagnosis Date  . CHF (congestive heart failure)     EF- 10-15  . Medically noncompliant   . Mitral regurgitation   . Tobacco user   . HTN (hypertension)   . AICD (automatic cardioverter/defibrillator) present   . GERD (gastroesophageal reflux disease)   . Substance abuse   . Chronic renal insufficiency   . Syncope   . Thrombus 08/06/2010  . SYSTOLIC HEART FAILURE, CHRONIC 09/22/2008    Qualifier: Diagnosis of  By: Gala Romney, MD, Trixie Dredge   . LV (left ventricular) mural thrombus 01/28/2011  . ICD - IN SITU 09/16/2008    Qualifier: Diagnosis of  By: Wonda Amis    . MITRAL STENOSIS/ INSUFFICIENCY, NON-RHEUMATIC 09/22/2008    Qualifier: Diagnosis of  By: Gala Romney, MD, Trixie Dredge Hepatomegaly 09/16/2008    Qualifier: Diagnosis of  By: Wonda Amis    . High cholesterol 02/26/2012    "at one time"  . Sleep apnea   . Exertional dyspnea 02/26/2012  . History of blood transfusion 08/2011    "when I had heart pump"  . Migraines   . COMMON MIGRAINE 06/14/2009    Qualifier: Diagnosis of  By: Jonny Ruiz MD, Len Blalock   . History of gout 02/26/2012  . Depression   . Bipolar affective disorder 10/22/2011    pt denies this hx 02/26/2012   Past Surgical History:  Past Surgical History  Procedure Laterality Date  . Cardiac defibrillator placement  ~ 2008  . Left ventricular assist device  08/2011  . Radiology with anesthesia N/A 07/19/2013    Procedure: RADIOLOGY WITH ANESTHESIA;  Surgeon: Oneal Grout, MD;  Location: [facility] OR;  Service: Radiology;  Laterality: N/A;  . 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 He is s/p LVAD placement at [facility] March 2013, as bridge to  transplant. Admitted for evaluation of new NSTEMI felt possibly to be related to pump thrombosis. Hosp course complicated by episodes of CP and hypotension on 1/24 pm hours. 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  Initial intubation 1/26-1/30; bedside swallow eval on 1/31 revealed severe motor based oropharyngeal dysphagia with cranial nerve involvement significantly limiting oral strength and ROM; recs were for NPO.   Pt re-intubated 2/1-2/4; trach 2/4.  Currently with #8 Shiley, cuff partially deflated.  Off vent since 2/5.     Assessment / Plan / Recommendation Clinical Impression  Initial PMSV assessment completed.  Cuff partially inflated upon arrival; 2 ml air removed with syringe.  PMSV placed for brief periods (1-2 resp cycles); pt unable to utilize upper airway for exhalation, phonation.  Size of trach (#8) and presence of deflated cuff likely prohibitive for upper airway access.  Will follow chart and reassess when pt is downsized to a #6.  Recommend waiting to assess swallow until that time as well. Educated pt re: progress and plan; nodded in agreement.     SLP Assessment  Patient needs continued Speech Language Pathology Services  Unable to safely use PMSV at this time.  Will follow for readiness.      Frequency and Duration min 2x/week  2  weeks       SLP Goals Potential to Achieve Goals: Fair   PMSV Trial  PMSV was placed for: 1-2 respiratory cycles at a time Able to redirect subglottic air through upper airway: No Able to Attain Phonation: No Voice Quality: Aphonic Able to Expectorate Secretions: No Level of Secretion Expectoration with PMSV: Tracheal Breath Support for Phonation:  (n/a) Intelligibility: Unable to assess (comment) SpO2 During Trial: 100 % Behavior: Alert;Controlled;Cooperative   Tracheostomy Tube    #8 Shiley with deflated cuff   Vent Dependency  FiO2 (%): 28 %    Cuff Deflation Trial Tolerated Cuff  Deflation: Yes Length of Time for Cuff Deflation Trial: 18 min Behavior: Alert;Controlled;Cooperative   Blenda MountsCouture, Seila Liston Laurice 08/03/2013, 3:33 PM

## 2013-08-03 NOTE — Anesthesia Postprocedure Evaluation (Signed)
  Anesthesia Post-op Note  Patient: [name]  Procedure(s) Performed: Procedure(s): CANCELLED PROCEDURE (N/A)

## 2013-08-03 NOTE — Progress Notes (Signed)
Rehab Admissions Coordinator Note:  Patient was screened by Trish Mage for appropriateness for an Inpatient Acute Rehab Consult.  At this time, we are recommending LTACH.  LTACH may allow for longer rehab time.  Lelon Frohlich M 08/03/2013, 9:31 AM  I can be reached at [phone].

## 2013-08-03 NOTE — Progress Notes (Signed)
Stroke Team Progress Note  HISTORY [name] is a 53 y.o. male left-handed male who presented with sudden onset left-sided weakness and aphasia. He was admitted 07/17/2013 with chest pain. He has a history of congestive heart failure and has a left ventricular assist device in place. His INR was low at home - his pump got clotted (likely causing MI), and he loaded himself and became supratherapeutic 4.97 in hospital. He was given vitamin K yesterday 07/18/2013, his INR came down to 1.9. He was then  started on heparin. Around 7:40 AM 07/19/2013, the nurse helped him to get dressed and stated that he seemed sleepy, he was moving his left side well. The patient endorsed that his symptoms started after he woke up this morning, but woke up 07/19/2013 in his normal state. He appeared to understand well. Patient was not administerd TPA secondary to being on systemic heparin and INR 1.9  SUBJECTIVE Pt lying in bed. Care team at bedside.  OBJECTIVE Most recent Vital Signs: Filed Vitals:   08/03/13 0700 08/03/13 0717 08/03/13 0722 08/03/13 0800  BP:      Pulse: 104  106 108  Temp:  98.6 F (37 C)    TempSrc:  Oral    Resp: 17  18 25   Height:      Weight:      SpO2: 98%  97% 95%   CBG (last 3)   Recent Labs  08/03/13 0007 08/03/13 0425 08/03/13 0714  GLUCAP 118* 114* 128*    IV Fluid Intake:   . sodium chloride 20 mL/hr at 08/03/13 0800  . heparin 1,200 Units/hr (08/03/13 0800)  . nitroGLYCERIN Stopped (07/17/13 1800)    MEDICATIONS  . allopurinol  100 mg Oral Daily  . antiseptic oral rinse  15 mL Mouth Rinse QID  . aspirin  81 mg Oral Daily  . chlorhexidine  15 mL Mouth Rinse BID  . clopidogrel  75 mg Oral Q breakfast  . etomidate  40 mg Intravenous Once  . feeding supplement (PRO-STAT SUGAR FREE 64)  30 mL Per Tube TID  . feeding supplement (VITAL HIGH PROTEIN)  1,000 mL Per Tube Q24H  . furosemide  40 mg Intravenous Once  . glycopyrrolate  1 mg Per Tube BID  . insulin aspart   0-15 Units Subcutaneous Q4H  . magnesium oxide  400 mg Oral Daily  . pantoprazole sodium  40 mg Per Tube Q1200  . potassium chloride  40 mEq Oral Once  . sodium chloride  10-40 mL Intracatheter Q12H  . spironolactone  25 mg Oral Daily  . warfarin  5 mg Oral ONCE-1800  . Warfarin - Pharmacist Dosing Inpatient   Does not apply q1800   PRN:  acetaminophen, fentaNYL, hydrALAZINE, loperamide, nitroGLYCERIN, ondansetron (ZOFRAN) IV  Diet:  NPO, tube feeding Activity:  Up in chair DVT Prophylaxis:  IV heparin, warfarin  CLINICALLY SIGNIFICANT STUDIES Basic Metabolic Panel:   Recent Labs Lab 08/02/13 0430 08/03/13 0434  NA 142 145  K 3.6* 3.4*  CL 108 107  CO2 24 26  GLUCOSE 127* 117*  BUN 17 20  CREATININE 0.87 0.82  CALCIUM 8.5 8.5   Liver Function Tests:   Recent Labs Lab 07/28/13 0330  AST 27  ALT 16  ALKPHOS 63  BILITOT 0.9  PROT 6.8  ALBUMIN 2.6*   CBC:   Recent Labs Lab 08/02/13 0430 08/03/13 0434  WBC 9.3 8.9  HGB 8.9* 8.7*  HCT 27.9* 27.2*  MCV 92.4 91.9  PLT 412*  434*   Coagulation:   Recent Labs Lab 07/31/13 0500 08/01/13 0415 08/02/13 0430 08/03/13 0434  LABPROT 15.9* 15.9* 17.8* 21.7*  INR 1.30 1.30 1.51* 1.96*   Cardiac Enzymes:  No results found for this basename: CKTOTAL, CKMB, CKMBINDEX, TROPONINI,  in the last 168 hours Urinalysis: No results found for this basename: COLORURINE, APPERANCEUR, LABSPEC, PHURINE, GLUCOSEU, HGBUR, BILIRUBINUR, KETONESUR, PROTEINUR, UROBILINOGEN, NITRITE, LEUKOCYTESUR,  in the last 168 hours Lipid Panel    Component Value Date/Time   CHOL 166 07/20/2013 0020   TRIG 96 07/20/2013 0020   HDL 63 07/20/2013 0020   CHOLHDL 2.6 07/20/2013 0020   VLDL 19 07/20/2013 0020   LDLCALC 84 07/20/2013 0020   HgbA1C  Lab Results  Component Value Date   HGBA1C 5.7* 07/19/2013    Urine Drug Screen:     Component Value Date/Time   LABOPIA NONE DETECTED 06/21/2008 1300   COCAINSCRNUR NONE DETECTED 06/21/2008 1300    LABBENZ NONE DETECTED 06/21/2008 1300   AMPHETMU NONE DETECTED 06/21/2008 1300   THCU NONE DETECTED 06/21/2008 1300   LABBARB  Value: NONE DETECTED        DRUG SCREEN FOR MEDICAL PURPOSES ONLY.  IF CONFIRMATION IS NEEDED FOR ANY PURPOSE, NOTIFY LAB WITHIN 5 DAYS. 06/21/2008 1300    Alcohol Level: No results found for this basename: ETH,  in the last 168 hours   CT of the brain   07/20/2013 1. New hypodensities in the left basal ganglia and left thalamus,  concerning for evolving acute infarcts. Smaller foci of hypoattenuation in the right caudate and thalamus may also represent acute ischemia.  2. New paranasal sinus mucosal disease. 07/19/2013    1. No acute intracranial process. 2. Very minimal amount of geographic subcortical encephalomalacia within in the anterior lateral aspect of the left temporal lobe, at the site of the patient's prior hemorrhagic contusion.   Cerebral Angiogram 07/20/2013 Rt CFa approach. Distal small filling defects in the the orbitofrontal br of RT MCA sup division,and tertiary angular division of RT MCA  MRI of the brain    2D Echocardiogram  EF 10%. Diffuse kypokinesis  TEE 07/21/2013 no evidence of clot but with RV failure.   CXR   07/28/2013 Tracheostomy tube with the tip 3 cm above the carina.   EKG  sinus tachycardia. For complete results please see formal report.   Therapy Recommendations LTACH, CIR  Physical Exam   Young african [place] male with tracheostomy. Afebrile. Head is nontraumatic. Neck is supple without bruit. Cardiac exam no murmur or gallop. Lungs are clear to auscultation. Distal pulses are well felt. LVAD right side abdomen Neurological Exam :  Awake  and follows commands well on R side. Pupils 3 mm sluggishly reactive. Doll's eye movements are present. .Eyes partially open and will blink to threat and follow gaze. Facial grimacing to pain. Spontaneous movement of RUE L sided hemiplegia but can move  Leg and toes and fingers now   slightly,  following commands on R side with antigravity movements.   ASSESSMENT [name] is a 53 y.o. male presenting with left sided weakness.  Imaging supports left basal ganglia and thalamic infarcts larger than right basal ganglia subcortical  infarcts. Infarcts cardioembolic secondary to potential sources of atrial fibrillation, LVAD clot and/or acute MI. TEE performed 07/21/2013 without source. On aspirin 81 mg orally every day, clopidogrel 75 mg orally every day and warfarin prior to admission. Now on aspirin 81 mg, Plavix 75 mg, IV heparin and warfarin for  secondary stroke prevention. Patient with resultant left arm hemiplegia, left leg hemiparesis (now able to lifting foot off the ground and kick foot forward), dysphagia.  Respiratory failure, extubated 07/25/2013 reintubated same day to protect airway, post profuse oral bleeding,  - IV heparin, aspirin and plavix on hold. Trach placement delayed secondary to elevated INR 2.09. FFP given prior to trach placement. Trach placxed 07/28/2013 by ENT without difficulty. On trach collar now. Dysphagia secondary to stroke. PEG placement attempted but pt was believed to be high risk. Recommended to continue PANDA. Seen by speech therapist this morning with significant coughing.   Systolic HF, EF 10%, has LVAD. On transplant list prior to admission. Given new stroke, no longer a transplant candidate.  new onset atrial fibrillation with RVR. On amiodarone. Back in NSR  NSTEMI - Elevated troponins - will manage medically hypertension Chronic RI Hyperlipidemia, LDL 84, on no statin PTA, now on no statin, at goal LDL < 100  obstructive sleep apnea Hx migraines Bipolar affective disorder Ejection fraction 15-20%. No obvious clot was noted.  Hypokalemia, Potassium supplementation   Hospital day # 17  TREATMENT/PLAN  Dr. Pearlean Brownie discussed anticoagulation with Dr. Gala Romney and Dr. Craige Cotta and care team - plans to continue Coumadin with IV heparin bridge,  aspirin and plavix at this time  May consider PEG in the future, depending on swallow return  LTACH placement anticipated. Given conditioning, doubt would be able to tolerated 3-6 hr of therapy a day that he would need to meet CIR criteria. Will touch base with them.  Annie Main, MSN, RN, ANVP-BC, ANP-BC, [facility] Pager: [phone] 08/03/2013 9:43 AM  I have personally obtained a history, examined the patient, evaluated imaging results, and formulated the assessment and plan of care. I agree with the above.  Delia Heady, MD

## 2013-08-03 NOTE — Progress Notes (Signed)
Patient ID: [name], male   DOB: 29-Aug-1960, 53 y.o.   MRN: [id number] Advanced Heart Failure Rounding Note  [name] is 53 y/o male with a history of severe CHF due to NICM EF 10-15%, and severe MR s/p HM II LVAD placed at [facility] March 2013. His medical history also consists of CRI, NSVT s/p ICD and LV thrombus.   Admitted 1/24 with NSTEMI felt secondary due embolic phenomenon due to pump thrombosis.  LDH high around 900.  On way to cath lab 1/26 developed acute onset of aphasia and left-sided hemiparesis. CT negative for bleed. Taken emergently to neuro-interventional suite but no amenable lesions. Remains intubated on levophed. Remains on propofol gtt, unable to assess neuro status.  S/P TEE 1/28. No evidence of clot but with RV failure.   Extubated 1/30. Reintubated 2/2 due to inability control secretions.   Trach placed due to difficulty with secretions and on trach collar this am.  Off norepinephrine. Map 80-90s . On heparin.  Yesterday he was given 40 mg IV lasix and 12.5 mg spironolactone was added. CVP 15 this morning.  Denies dyspnea.   Hgb 11.6> 9.8>9.0> 8.5>9.7>9.3>8.9>8.7  LDH:  976>1568>1165>1124>937>870>762>787>805>753 Co-ox: 62%>57%>63%>63%>70%>71% >67% INR 1.4>1.69>1.3>1.5 >1.96  VAD interrogated personally.  Flow 6.5, Speed 9200 PI 6.4,  Power 5.6    Events: 3 PI last night with suctioning.   Filed Vitals:   08/03/13 1531 08/03/13 1600 08/03/13 1609 08/03/13 1700  BP:      Pulse:  103    Temp:   99.8 F (37.7 C)   TempSrc:   Oral   Resp: 19 23  22   Height:      Weight:      SpO2: 98% 99%      Intake/Output Summary (Last 24 hours) at 08/03/13 1747 Last data filed at 08/03/13 1700  Gross per 24 hour  Intake   2038 ml  Output   2710 ml  Net   -672 ml   Scheduled Meds: . allopurinol  100 mg Oral Daily  . antiseptic oral rinse  15 mL Mouth Rinse QID  . aspirin  81 mg Oral Daily  . chlorhexidine  15 mL Mouth Rinse BID  . clopidogrel  75 mg Oral Q breakfast  .  etomidate  40 mg Intravenous Once  . feeding supplement (PRO-STAT SUGAR FREE 64)  30 mL Per Tube QID  . glycopyrrolate  1 mg Per Tube BID  . insulin aspart  0-15 Units Subcutaneous Q4H  . magnesium oxide  400 mg Oral Daily  . pantoprazole sodium  40 mg Per Tube Q1200  . sodium chloride  10-40 mL Intracatheter Q12H  . spironolactone  25 mg Oral Daily  . warfarin  5 mg Oral ONCE-1800  . Warfarin - Pharmacist Dosing Inpatient   Does not apply q1800   Continuous Infusions: . sodium chloride 20 mL/hr at 08/03/13 1600  . feeding supplement (OSMOLITE 1.5 CAL)    . heparin 1,200 Units/hr (08/03/13 1600)  . nitroGLYCERIN Stopped (07/17/13 1800)   PRN Meds:.acetaminophen, fentaNYL, hydrALAZINE, loperamide, nitroGLYCERIN, ondansetron (ZOFRAN) IV  LABS: Basic Metabolic Panel:  Recent Labs  12/87/86 0430 08/03/13 0434  NA 142 145  K 3.6* 3.4*  CL 108 107  CO2 24 26  GLUCOSE 127* 117*  BUN 17 20  CREATININE 0.87 0.82  CALCIUM 8.5 8.5   Liver Function Tests: No results found for this basename: AST, ALT, ALKPHOS, BILITOT, PROT, ALBUMIN,  in the last 72 hours No results found for this  basename: LIPASE, AMYLASE,  in the last 72 hours CBC:  Recent Labs  08/02/13 0430 08/03/13 0434  WBC 9.3 8.9  HGB 8.9* 8.7*  HCT 27.9* 27.2*  MCV 92.4 91.9  PLT 412* 434*   Cardiac Enzymes: No results found for this basename: CKTOTAL, CKMB, CKMBINDEX, TROPONINI,  in the last 72 hours RADIOLOGY: Dg Chest Portable 1 View  07/17/2013   CLINICAL DATA:  Left-sided chest pain for 3 days, weakness and dizziness.  EXAM: PORTABLE CHEST - 1 VIEW  COMPARISON:  08/03/2012  FINDINGS: Evidence of median sternotomy noted with LVAD in place. Left-sided defibrillator noted. Mild prominence of the cardiac silhouette persists. Tricuspid valvuloplasty reidentified. Trace left pleural fluid or thickening persists. No new pulmonary opacity.  IMPRESSION: No new acute abnormality. Stable appearance of trace left pleural  fluid or thickening.   Electronically Signed   By: Christiana Pellant M.D.   On: 07/17/2013 10:12    PHYSICAL EXAM- CVP ~15  General: NAD; on trach collar;  Neck: JVP 11; trach Lungs: Diffuse rhonchi CV: LVAD pump hum normal.   Abdomen: Soft, nontender, no hepatosplenomegaly, no distention. Hypoactive BS Neurologic: awake interactive. Expressive aphasia. Moving left foot, slight movement left hand.   Extremities: No clubbing or cyanosis. LUE 1+ edema RUE PICC  TELEMETRY: SR 90s    Assessment:   1. Acute embolic CVA 2. NSTEMI - Likely pump thrombosis with coronary embolus.  2. Chronic systolic HF s/p HM II VAD  3. NICM EF 15%  4. Hyperkalemia  5. HTN  6. Acute respiratory failure 7. Shock - suspect mixed picture RV failure/vasodilatory 8. Oral bleeding  Plan/Discussion:    He has had NSTEMI and acute CVA due to pump thrombosis/embolism. Now with expressive aphasia L hemiplegia. Neurology has suggested that speech and ability to control secretions will return in several months but L-sided plegia may not recover.   Hemoglobin drifting down. For now continue  ASA and Plavix.  He will continue heparin bridge to therapeutic INR.  LDH back down  [phone]  Continues on trach collar this am.  Secretions better with Robinul and no epistaxis now. Deemed too high risk currently for PEG or J-tube.  To keep panda tube for now.   Hemodynamically stable of inotropes. CVP ok. Maintaining SR off amiodarone. Agree with heparin/coumadin overlap for one more day. D/w Pharmacy.  From neuro perspective he is making rapid strides over past 24-48 hours. Now able to lift L leg off bed and wiggle L hand. Long talk with Stroke team about situation. They feel he is candidate for CIR. However he has been evaluated fro CIR and concern from CIR team is that there are too many medical issues currently to permit 3-4 hours of PT per day.  Given his complexity I would favor having him at CIR so we can follow  closely if at all possible. I think the best plan is to keep him in house for 3-4 more days and see how he progresses. At his current rate of improvement I am hopeful that he may progress to the point where he can be a CIR candidate. If not, will consider transfer to [facility] with Dr. Angelina Ok.   The patient is critically ill with multiple organ systems failure and requires high complexity decision making for assessment and support, frequent evaluation and titration of therapies, application of advanced monitoring technologies and extensive interpretation of multiple databases.   Critical Care Time devoted to patient care services described in this note is 45 Minutes.  I reviewed the LVAD parameters from today, and compared the results to the patient's prior recorded data. No programming changes were made. The LVAD is functioning within specified parameters. The patient performs LVAD self-test daily. LVAD interrogation was negative for any significant power changes, alarms or PI events/speed drops. LVAD equipment check completed and is in good working order. Back-up equipment present. LVAD education done on emergency procedures and precautions and reviewed exit site care. Truman Haywardaniel Bensimhon,MD 5:54 PM

## 2013-08-03 NOTE — Progress Notes (Addendum)
NUTRITION FOLLOW UP  Intervention:    Change TF regimen to Osmolite 1.5 formula -- initiate at 20 ml/hr and increase by 10 ml every 4 hours to goal rate of 40 ml/hr with Prostat liquid protein 30 ml QID via tube to provide 1840 total kcals, 120 gm protein, 732 ml of free water RD to follow for nutrition care plan  Nutrition Dx:   Inadequate oral intake related to inability to eat as evidenced by NPO status, ongoing  New Goal:   Pt to meet >/= 90% of their estimated nutrition needs, currently unmet  Monitor:   TF regimen & tolerance, respiratory status, weight, labs, I/O's  Assessment:   Patient with PMH of severe CHF, mitral regurgitation and HTN; [facility], as bridge to transplant; admitted for evaluation of new NSTEMI possibly related to pump thrombosis.   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   Patient s/p procedure 2/4: PERCUTANEOUS TRACHEOSTOMY   Patient currently on trach collar -- off ventilatory support.    PEG tube planned 2/6 to avoid LVAD wires that are across patient's abdomen, however, IR unable to place given high risk.  J-tube placement considered, however, again too high risk with recent MI and stroke.    Vital HP formula currently infusing at 40 ml/hr with Prostat liquid protein 30 ml TID providing 1260 kcals, 129 gm protein, 803 ml of water.  Plan is to continue TF via small-bore feeding tube (placed 2/6, tip near ligament of Treitz).  Disposition: [facility] for rehab.  RD to change TF regimen now that patient off vent support and [facility]  Height: Ht Readings from Last 1 Encounters:  07/17/13 5\' 4"  (1.626 m)    Weight Status  Wt Readings from Last 1 Encounters:  08/03/13 187 lb 6.3 oz (85 kg)    Body mass index is 32.15 kg/(m^2).  Re-estimated needs:  Kcal: 1700-1900 Protein: 120-130 gm Fluid: per MD  Skin: groin puncture  site  Diet Order: NPO   Intake/Output Summary (Last 24 hours) at 08/03/13 1116 Last data filed at 08/03/13 1012  Gross per 24 hour  Intake   2014 ml  Output   1310 ml  Net    704 ml    Labs:   Recent Labs Lab 08/01/13 0415 08/02/13 0430 08/03/13 0434  NA 143 142 145  K 3.4* 3.6* 3.4*  CL 107 108 107  CO2 24 24 26   BUN 16 17 20   CREATININE 0.88 0.87 0.82  CALCIUM 8.6 8.5 8.5  GLUCOSE 124* 127* 117*    CBG (last 3)   Recent Labs  08/03/13 0007 08/03/13 0425 08/03/13 0714  GLUCAP 118* 114* 128*    Scheduled Meds: . allopurinol  100 mg Oral Daily  . antiseptic oral rinse  15 mL Mouth Rinse QID  . aspirin  81 mg Oral Daily  . chlorhexidine  15 mL Mouth Rinse BID  . clopidogrel  75 mg Oral Q breakfast  . etomidate  40 mg Intravenous Once  . feeding supplement (PRO-STAT SUGAR FREE 64)  30 mL Per Tube TID  . feeding supplement (VITAL HIGH PROTEIN)  1,000 mL Per Tube Q24H  . glycopyrrolate  1 mg Per Tube BID  . insulin aspart  0-15 Units Subcutaneous Q4H  . magnesium oxide  400 mg Oral Daily  . pantoprazole sodium  40 mg Per Tube Q1200  . potassium chloride  40  mEq Oral Once  . sodium chloride  10-40 mL Intracatheter Q12H  . spironolactone  25 mg Oral Daily  . warfarin  5 mg Oral ONCE-1800  . Warfarin - Pharmacist Dosing Inpatient   Does not apply q1800    Continuous Infusions: . sodium chloride 20 mL/hr at 08/03/13 0800  . heparin 1,200 Units/hr (08/03/13 0800)  . nitroGLYCERIN Stopped (07/17/13 1800)    Maureen Chatters, RD, LDN Pager #: [phone] After-Hours Pager #: [phone]

## 2013-08-03 NOTE — Progress Notes (Signed)
Physical Therapy Treatment Patient Details Name: Leanne LovelyUndra Crapps MRN: [id number] DOB: 10/14/1960 Today's Date: 08/03/2013 Time: [id number] PT Time Calculation (min): 13 min  PT Assessment / Plan / Recommendation  History of Present Illness [name] is 53 y/o male with a history of severe CHF due to NICM EF 10-15%, and severe MR s/p HM II [facility]. His medical history also consists of CRI, NSVT s/p ICD and LV thrombus. Admitted 1/24 with NSTEMI felt secondary due embolic phenomenon due to pump thrombosis.  LDH high around 900.  Extubated 1/30. Reintubated 2/2 due to inability control secretions. Trach 2/4 and on trach collar this am. Smiling and denies any pain. MAPs 80-90s on levophed gtt 2 mcg. No further AF on amio. Pending PEG today - heparin on hold. Starting to move L side minimally.    PT Comments   Participated well; worked on standing balance, w/shift and pivoting to chair.  Follow Up Recommendations   LTAC      Does the patient have the potential to tolerate intense rehabilitation     Barriers to Discharge        Equipment Recommendations   (TBA)    Recommendations for Other Services    Frequency     Progress towards PT Goals Progress towards PT goals: Progressing toward goals  Plan Current plan remains appropriate    Precautions / Restrictions Precautions Precautions: Fall   Pertinent Vitals/Pain VSS    Mobility  Bed Mobility Overal bed mobility: Needs Assistance;+2 for physical assistance Bed Mobility: Supine to Sit Supine to sit: Mod assist Sit to supine: +2 for safety/equipment General bed mobility comments: bridged to EOB with mod,  mod truncal assist Transfers Overall transfer level: Needs assistance Transfers: Sit to/from Stand;Stand Pivot Transfers Sit to Stand: Min assist;Mod assist;+2 safety/equipment Stand pivot transfers: +2 physical assistance;Mod assist General transfer comment: cues for technique; assist to come forward and lift  assist.  W/shift assist during pivot with stability    Exercises     PT Diagnosis:    PT Problem List:   PT Treatment Interventions:     PT Goals (current goals can now be found in the care plan section) Acute Rehab PT Goals PT Goal Formulation: With patient Time For Goal Achievement: 08/13/13 Potential to Achieve Goals: Good  Visit Information  Last PT Received On: 08/03/13 Assistance Needed: +2 History of Present Illness: [name] is 53 y/o male with a history of severe CHF due to NICM EF 10-15%, and severe MR s/p HM II [facility]. His medical history also consists of CRI, NSVT s/p ICD and LV thrombus. Admitted 1/24 with NSTEMI felt secondary due embolic phenomenon due to pump thrombosis.  LDH high around 900.  Extubated 1/30. Reintubated 2/2 due to inability control secretions. Trach 2/4 and on trach collar this am. Smiling and denies any pain. MAPs 80-90s on levophed gtt 2 mcg. No further AF on amio. Pending PEG today - heparin on hold. Starting to move L side minimally.     Subjective Data      Cognition  Cognition Arousal/Alertness: Awake/alert Behavior During Therapy: Flat affect Overall Cognitive Status: Difficult to assess Difficult to assess due to: Tracheostomy    Balance  Balance Overall balance assessment: Needs assistance Standing balance support: Bilateral upper extremity supported Standing balance-Leahy Scale: Fair  End of Session PT - End of Session Equipment Utilized During Treatment: Oxygen;Other (comment) Activity Tolerance: Patient tolerated treatment well;Patient limited by fatigue Patient left: with  call bell/phone within reach;in chair;with nursing/sitter in room Nurse Communication: Mobility status   GP     Keldan Eplin, Eliseo Gum 08/03/2013, 4:36 PM 08/03/2013  [place], PT [phone]  (pager)

## 2013-08-03 NOTE — Transfer of Care (Signed)
Immediate Anesthesia Transfer of Care Note  Patient: [name]  Procedure(s) Performed: Procedure(s): CANCELLED PROCEDURE (N/A)

## 2013-08-03 NOTE — Progress Notes (Signed)
ANTICOAGULATION CONSULT NOTE - Follow Up Consult  Pharmacy Consult for Heparin, resume Coumadin Indication: LVAD thrombosis/CVA    Allergies  Allergen Reactions  . Ace Inhibitors Cough  . Lexapro [Escitalopram Oxalate] Other (See Comments)    somnolence    Patient Measurements: Height: 5\' 4"  (162.6 cm) Weight: 187 lb 6.3 oz (85 kg) IBW/kg (Calculated) : 59.2 Heparin Dosing Weight: 77kg  Vital Signs: Temp: 98.6 F (37 C) (02/10 0717) Temp src: Oral (02/10 0717) Pulse Rate: 108 (02/10 0800)  Labs:  Recent Labs  08/01/13 0415 08/02/13 0430 08/03/13 0434  HGB 9.3* 8.9* 8.7*  HCT 28.7* 27.9* 27.2*  PLT 422* 412* 434*  LABPROT 15.9* 17.8* 21.7*  INR 1.30 1.51* 1.96*  HEPARINUNFRC 0.36 0.41 0.39  CREATININE 0.88 0.87 0.82    Estimated Creatinine Clearance: 105.9 ml/min (by C-G formula based on Cr of 0.82).  . sodium chloride 20 mL/hr at 08/03/13 0800  . heparin 1,200 Units/hr (08/03/13 0800)  . nitroGLYCERIN Stopped (07/17/13 1800)    Assessment: 52yom with LVAD, suspected pump thrombosis and acute CVA continues on heparin bridge to warfarin. Heparin level remains therapeutic at 0.39 heaprin drip 1200 uts/hr. H/H drifting down 9.7/29.8>8.7/27.9, Plts stable. Slightly amber colored urine,  clear secretions. Patient at high risk for PEG or J-tube, Panda tube continued.  INR 1.96- started to bump after Coumadin 10mg  x 2.  Patient now off amio (1/27-2/6)  Goal of Therapy:  Heparin level 0.3-0.5 units/ml Monitor platelets by anticoagulation protocol: Yes INR 2.0-3.0   Plan:  Continue heparin drip 1200 units/hr (12 ml/hr) Will continue to monitor bleeding Coumadin 5mg  x 1 tonight Daily heparin level, CBC, and INR   Leota Sauers Pharm.D. CPP, BCPS Clinical Pharmacist [phone] 08/03/2013 10:10 AM

## 2013-08-04 ENCOUNTER — Inpatient Hospital Stay (HOSPITAL_COMMUNITY): Payer: Medicare Other

## 2013-08-04 LAB — URINALYSIS, ROUTINE W REFLEX MICROSCOPIC
BILIRUBIN URINE: NEGATIVE
GLUCOSE, UA: NEGATIVE mg/dL
KETONES UR: NEGATIVE mg/dL
Leukocytes, UA: NEGATIVE
Nitrite: NEGATIVE
PH: 7 (ref 5.0–8.0)
Protein, ur: 100 mg/dL — AB
Specific Gravity, Urine: 1.028 (ref 1.005–1.030)
Urobilinogen, UA: 4 mg/dL — ABNORMAL HIGH (ref 0.0–1.0)

## 2013-08-04 LAB — BASIC METABOLIC PANEL
BUN: 28 mg/dL — AB (ref 6–23)
CALCIUM: 8.8 mg/dL (ref 8.4–10.5)
CO2: 25 mEq/L (ref 19–32)
CREATININE: 0.99 mg/dL (ref 0.50–1.35)
Chloride: 106 mEq/L (ref 96–112)
GFR calc Af Amer: >90 mL/min (ref >90)
GLUCOSE: 122 mg/dL — AB (ref 70–99)
Potassium: 3.5 mEq/L — ABNORMAL LOW (ref 3.7–5.3)
Sodium: 145 mEq/L (ref 137–147)

## 2013-08-04 LAB — GLUCOSE, CAPILLARY
GLUCOSE-CAPILLARY: 106 mg/dL — AB (ref 70–99)
GLUCOSE-CAPILLARY: 110 mg/dL — AB (ref 70–99)
GLUCOSE-CAPILLARY: 110 mg/dL — AB (ref 70–99)
GLUCOSE-CAPILLARY: 107 mg/dL — AB (ref 70–99)
Glucose-Capillary: 112 mg/dL — ABNORMAL HIGH (ref 70–99)
Glucose-Capillary: 97 mg/dL (ref 70–99)

## 2013-08-04 LAB — URINE MICROSCOPIC-ADD ON

## 2013-08-04 LAB — CBC
HCT: 27.6 % — ABNORMAL LOW (ref 39.0–52.0)
HEMOGLOBIN: 8.9 g/dL — AB (ref 13.0–17.0)
MCH: 29.7 pg (ref 26.0–34.0)
MCHC: 32.2 g/dL (ref 30.0–36.0)
MCV: 92 fL (ref 78.0–100.0)
Platelets: 462 10*3/uL — ABNORMAL HIGH (ref 150–400)
RBC: 3 MIL/uL — AB (ref 4.22–5.81)
RDW: 16.2 % — ABNORMAL HIGH (ref 11.5–15.5)
WBC: 8.9 10*3/uL (ref 4.0–10.5)

## 2013-08-04 LAB — CARBOXYHEMOGLOBIN
CARBOXYHEMOGLOBIN: 2.1 % — AB (ref 0.5–1.5)
METHEMOGLOBIN: 0.7 % (ref 0.0–1.5)
O2 SAT: 59.1 %
Total hemoglobin: 10.7 g/dL — ABNORMAL LOW (ref 13.5–18.0)

## 2013-08-04 LAB — PROTIME-INR
INR: 1.98 — ABNORMAL HIGH (ref 0.00–1.49)
Prothrombin Time: 21.9 seconds — ABNORMAL HIGH (ref 11.6–15.2)

## 2013-08-04 LAB — HEPARIN LEVEL (UNFRACTIONATED): Heparin Unfractionated: 0.47 IU/mL (ref 0.30–0.70)

## 2013-08-04 LAB — LACTATE DEHYDROGENASE: LDH: 977 U/L — AB (ref 94–250)

## 2013-08-04 MED ORDER — WARFARIN SODIUM 10 MG PO TABS
10.0000 mg | ORAL_TABLET | Freq: Once | ORAL | Status: DC
  Filled 2013-08-04 (×2): qty 1

## 2013-08-04 NOTE — Progress Notes (Signed)
Pulmonary/Critical Care Progress Note   Name: [name] MRN: [id number] DOB: 07/18/1960    ADMISSION DATE:  07/17/2013 CONSULTATION DATE:  1/26  REFERRING MD :  Bensimhon  CHIEF COMPLAINT:  Vent weaning and hypotension  BRIEF PATIENT DESCRIPTION:  This is a [age] year old male w/ known h/o NICM EF 10-15% w/ severe MR. He is [facility], as bridge to transplant. Admitted for evaluation of new NSTEMI felt possibly to be related to pump thrombosis. Hosp course complicated by episodes of CP and hypotension on 1/24 pm hours. 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 PCCM asked to see post-op for vent weaning.    SIGNIFICANT EVENTS / STUDIES:  1/24: admitted w/ CP and trop rise. Did have sub-therapeutic INR at home so concern was for pump thrombosis.  1/24: evening had CP, rising Trop I, got mso4 and NTG>>>got hypotensive requiring pressors.  1/25 INR supra-therapeutic. Coumadin stopped, heparin started 1/26: CT head: negative for bleed.  1/26:cerebral arteriogram:  acute right MCA embolic CVA. Not amendable to extraction due to distal location.  1/30 Extubated but airway protection is questionable at best 2/01 Reintubated 2/03 ENT eval Jearld Fenton(Byers) clots suctioned from mouth - no active bleeding site 2/05 Off vent  LINES / TUBES: LVAD OETT 1/26>>>1/30, 2/1 >>2/4 Trach (DF) 2/4 >> Right PICC 1/25>>>  CULTURES: Resp 1/27 >> few GNR >> normal flora  ANTIBIOTICS: 1/27 zosyn (aspiration, GNR) >> 2/5  SUBJECTIVE:  No further fever.  Decreased secretions.  VITAL SIGNS: Temp:  [98.1 F (36.7 C)-100.8 F (38.2 C)] 98.3 F (36.8 C) (02/11 0758) Pulse Rate:  [71-124] 106 (02/11 0813) Resp:  [17-32] 24 (02/11 0813) BP: (98-132)/(58-75) 99/75 mmHg (02/11 0410) SpO2:  [92 %-100 %] 96 % (02/11 0813) Arterial Line BP: (82-122)/(63-118) 90/78 mmHg (02/11 0600) FiO2 (%):  [28 %] 28 % (02/11 0813) Weight:  [189 lb 2.5 oz (85.8 kg)]  189 lb 2.5 oz (85.8 kg) (02/11 0500) HEMODYNAMICS: CVP:  [11 mmHg-13 mmHg] 13 mmHg TC 28% INTAKE / OUTPUT: Intake/Output     02/10 0701 - 02/11 0700 02/11 0701 - 02/12 0700   I.V. (mL/kg) 736 (8.6)    NG/GT 940    Total Intake(mL/kg) 1676 (19.5)    Urine (mL/kg/hr) 2585 (1.3)    Total Output 2585     Net -909          Stool Occurrence 2 x     PHYSICAL EXAMINATION: General:  No distress Neuro: alert, follows commands HEENT: trach site clean Cardiovascular:  LVAD pump hum. No edema  Lungs: scattered rhonchi, no wheeze Abdomen:  Soft, nontender  Musculoskeletal:  Intact  Skin:  Intact   LABS:  CBC  Recent Labs Lab 08/02/13 0430 08/03/13 0434 08/04/13 0500  WBC 9.3 8.9 8.9  HGB 8.9* 8.7* 8.9*  HCT 27.9* 27.2* 27.6*  PLT 412* 434* 462*   Coag's  Recent Labs Lab 08/02/13 0430 08/03/13 0434 08/04/13 0500  INR 1.51* 1.96* 1.98*    BMET  Recent Labs Lab 08/02/13 0430 08/03/13 0434 08/04/13 0500  NA 142 145 145  K 3.6* 3.4* 3.5*  CL 108 107 106  CO2 24 26 25   BUN 17 20 28*  CREATININE 0.87 0.82 0.99  GLUCOSE 127* 117* 122*   Electrolytes  Recent Labs Lab 08/02/13 0430 08/03/13 0434 08/04/13 0500  CALCIUM 8.5 8.5 8.8   Glucose  Recent Labs Lab 08/03/13 1243 08/03/13 1604 08/03/13 1930 08/03/13 2326  08/04/13 0325 08/04/13 0755  GLUCAP 98 97 107* 104* 106* 112*    Imaging Dg Chest Port 1 View  08/04/2013   CLINICAL DATA:  Airspace opacity in the lung bases.  EXAM: PORTABLE CHEST - 1 VIEW  COMPARISON:  DG CHEST 1V PORT dated 07/28/2013; DG CHEST 1V PORT dated 07/28/2013; DG CHEST 1V PORT dated 07/27/2013; DG CHEST 1V PORT dated 07/26/2013; DG CHEST 1V PORT dated 07/25/2013  FINDINGS: Left ventricular assist device noted. There is been interval placement of a feeding tube which extends into the stomach. Tracheostomy tube in place. AICD noted.  Left basilar airspace opacity noted, similar to prior. Cannot exclude left pleural effusion. Indistinct  airspace opacity at the right lung base without complete obscuration of the right hemidiaphragm. Overall similar appearance to prior aside from the new feeding tube.  IMPRESSION: 1. New feeding tube in place. Otherwise stable appearance of the chest.   Electronically Signed   By: Herbie Baltimore M.D.   On: 08/04/2013 08:04    ASSESSMENT / PLAN:  PULMONARY A: Acute respiratory failure 2nd to Rt MCA-CVA and difficulty with airway secretions.  Re-intubated 2/01. Bloody oral secretions ?from traumatic intubation 2/01 -no active bleed on ENT exam/directlaryngoscopy.  S/p tracheostomy >> off vent since 2/05. P:   -ATC as tolerated -defer downsizing trach until later this week or early next week to ensure mature stoma -Speech therapy to assess for PM valve >> likely will do better once trach downsized. -Robinul - good response, can increase to tid prn if needed -f/u CXR intermittently  CARDIOVASCULAR A:  NSTEMI (felt to be Pump thrombosis) Acute on Chronic systolic HF (EF 10-15%) w/ LVAD placed in march 2013 at [facility].  Cardiogenic shock >> resolved. P - LVAD per cards, co-ox improving -Asa, plavix per cards  -heparin resumed 2/4  RENAL A:   AKI in setting of hypoperfusion, s/p contrast load for cerebral arteriogram >> resolved. Hypokalemia. P:   -F/u chemistry  -replete K  GASTROINTESTINAL A:   Nutrition -high risk for PEG or J-tube. P:   -PPI for SUP -resumed tube feeds with panda -bowel regimen -speech therapy to assess swallowing >> may need to defer until trach can be downsized.  HEMATOLOGIC A:   Chronic anticoagulation (on coumadin for LVAD) >> Concern about acute pump related embolic events. Anemia of chronic disease. P:  -Heparin/coumadin per pharmacy  -f/u CBC  INFECTIOUS A:   ? aspiration pneumonia >> Sputum cx negative. Fever 2/09 >> no obvious source of infection. P:   -if he has recurrent fever, then consider sending cultures and adding  Abx  ENDOCRINE A:   Mild hyperglycemia. P:   -SSI if glucose > 150.  NEUROLOGIC A:   Right MCA embolic CVA.  P:   -per neuro -aggressive PT/OT  PCCM will f/u on Friday 2/13 to assess when to downsize trach.  Call if help needed sooner.  Coralyn Helling, MD [facility] Pulmonary/Critical Care 08/04/2013, 9:20 AM Pager:  [phone] After 3pm call: [phone]

## 2013-08-04 NOTE — Progress Notes (Signed)
Rehab admissions - Evaluated for possible admission.  Please see rehab consult done by Dr. Riley Kill recommending LTACH.  Agree with need for LTACH.  Call me for questions.  #[phone]

## 2013-08-04 NOTE — Progress Notes (Signed)
Occupational Therapy Treatment Patient Details Name: Leanne LovelyUndra Conger MRN: [id number] DOB: 09/18/1960 Today's Date: 08/04/2013 Time: [id number] OT Time Calculation (min): 38 min  OT Assessment / Plan / Recommendation  History of present illness [name] is 53 y/o male with a history of severe CHF due to NICM EF 10-15%, and severe MR s/p HM II [facility]. His medical history also consists of CRI, NSVT s/p ICD and LV thrombus. Admitted 1/24 with NSTEMI felt secondary due embolic phenomenon due to pump thrombosis.  LDH high around 900.  Extubated 1/30. Reintubated 2/2 due to inability control secretions. Trach 2/4 and on trach collar this am. Smiling and denies any pain. MAPs 80-90s on levophed gtt 2 mcg. No further AF on amio. Pending PEG today - heparin on hold. Starting to move L side minimally.    OT comments  Pt making excellent progress.  Pt is very motivated.  Continue to feel he would be an excellent candidate for CIR.     Follow Up Recommendations  CIR;Supervision/Assistance - 24 hour    Barriers to Discharge       Equipment Recommendations  3 in 1 bedside comode    Recommendations for Other Services Rehab consult  Frequency Min 3X/week   Progress towards OT Goals Progress towards OT goals: Progressing toward goals  Plan Discharge plan remains appropriate    Precautions / Restrictions Precautions Precautions: Fall   Pertinent Vitals/Pain     ADL  Upper Body Bathing: Minimal assistance Where Assessed - Upper Body Bathing: Supported sitting Lower Body Bathing: Moderate assistance Where Assessed - Lower Body Bathing: Supported sit to stand Upper Body Dressing: Moderate assistance Where Assessed - Upper Body Dressing: Unsupported sitting;Supported sitting Transfers/Ambulation Related to ADLs: mod A - pt with motor planning deficits and difficulty advancing Rt LE ADL Comments: Worked on trunk control, facilititation of Lt. UE and functional transfers.  Sits  unsupported with supervision. Able to use Lt. UE as a stabilizer with min A.  Pt. able to retrieve item at 40* shoulder flexion with max facilitation.  Able to extend and flex fingers in small movements.   Worked on controlled sit to stand with facilitation at pelvis and rib cage - min - mod A    OT Diagnosis:    OT Problem List:   OT Treatment Interventions:     OT Goals(current goals can now be found in the care plan section) ADL Goals Pt Will Perform Grooming: with supervision;sitting Pt Will Perform Upper Body Bathing: with min assist;sitting Pt Will Perform Lower Body Bathing: sit to/from stand;with mod assist Pt Will Perform Upper Body Dressing: sitting;with mod assist Pt Will Perform Lower Body Dressing: with max assist;sit to/from stand Pt Will Transfer to Toilet: with min assist;stand pivot transfer;bedside commode Pt Will Perform Toileting - Clothing Manipulation and hygiene: with mod assist;sit to/from stand Additional ADL Goal #1: Pt will use Lt. UE as a stabilizer during BADLs Additional ADL Goal #2: Pt will be able to manage LVAD equipment with mod A  Visit Information  Last OT Received On: 08/04/13 Assistance Needed: +2 History of Present Illness: [name] is 53 y/o male with a history of severe CHF due to NICM EF 10-15%, and severe MR s/p HM [facility]. His medical history also consists of CRI, NSVT s/p ICD and LV thrombus. Admitted 1/24 with NSTEMI felt secondary due embolic phenomenon due to pump thrombosis.  LDH high around 900.  Extubated 1/30. Reintubated 2/2 due to  inability control secretions. Trach 2/4 and on trach collar this am. Smiling and denies any pain. MAPs 80-90s on levophed gtt 2 mcg. No further AF on amio. Pending PEG today - heparin on hold. Starting to move L side minimally.     Subjective Data      Prior Functioning       Cognition  Cognition Arousal/Alertness: Awake/alert Behavior During Therapy: Flat affect Overall Cognitive  Status: Within Functional Limits for tasks assessed (no overt cognitive deficits noted. ) Difficult to assess due to: Tracheostomy    Mobility  Bed Mobility Overal bed mobility: Needs Assistance Bed Mobility: Sit to Supine Sit to supine: Mod assist;+2 for safety/equipment General bed mobility comments: Assist for Lt. LE and to lower upper trunk  Transfers Overall transfer level: Needs assistance Transfers: Sit to/from Stand;Stand Pivot Transfers Sit to Stand: +2 safety/equipment;Mod assist Stand pivot transfers: Mod assist;+2 safety/equipment General transfer comment: facilitation through Lt. hip and rib cage.  Difficulty advancing Rt. LE - was able to advance Lt. LE - component of apraxia    Exercises      Balance Balance Overall balance assessment: Needs assistance Sitting-balance support: Feet supported Sitting balance-Leahy Scale: Good Standing balance support: Bilateral upper extremity supported Standing balance-Leahy Scale: Fair  End of Session OT - End of Session Activity Tolerance: Patient tolerated treatment well Patient left: in bed;with call bell/phone within reach;with nursing/sitter in room Nurse Communication: Mobility status  GO     Jeani Hawking M 08/04/2013, 4:28 PM

## 2013-08-04 NOTE — Progress Notes (Signed)
Stroke Team Progress Note  HISTORY [name] is a 53 y.o. male left-handed male who presented with sudden onset left-sided weakness and aphasia. He was admitted 07/17/2013 with chest pain. He has a history of congestive heart failure and has a left ventricular assist device in place. His INR was low at home - his pump got clotted (likely causing MI), and he loaded himself and became supratherapeutic 4.97 in hospital. He was given vitamin K yesterday 07/18/2013, his INR came down to 1.9. He was then  started on heparin. Around 7:40 AM 07/19/2013, the nurse helped him to get dressed and stated that he seemed sleepy, he was moving his left side well. The patient endorsed that his symptoms started after he woke up this morning, but woke up 07/19/2013 in his normal state. He appeared to understand well. Patient was not administerd TPA secondary to being on systemic heparin and INR 1.9  SUBJECTIVE Pt lying in bed. No new complaints.  OBJECTIVE Most recent Vital Signs: Filed Vitals:   08/04/13 0500 08/04/13 0600 08/04/13 0758 08/04/13 0813  BP:      Pulse: 112 109  106  Temp:   98.3 F (36.8 C)   TempSrc:   Oral   Resp: 28 26  24   Height:      Weight: 85.8 kg (189 lb 2.5 oz)     SpO2: 95% 96%  96%   CBG (last 3)   Recent Labs  08/03/13 2326 08/04/13 0325 08/04/13 0755  GLUCAP 104* 106* 112*    IV Fluid Intake:   . sodium chloride 20 mL/hr at 08/04/13 0600  . feeding supplement (OSMOLITE 1.5 CAL)    . heparin 1,200 Units/hr (08/04/13 0800)  . nitroGLYCERIN Stopped (07/17/13 1800)    MEDICATIONS  . allopurinol  100 mg Oral Daily  . antiseptic oral rinse  15 mL Mouth Rinse QID  . aspirin  81 mg Oral Daily  . chlorhexidine  15 mL Mouth Rinse BID  . clopidogrel  75 mg Oral Q breakfast  . etomidate  40 mg Intravenous Once  . feeding supplement (PRO-STAT SUGAR FREE 64)  30 mL Per Tube QID  . glycopyrrolate  1 mg Per Tube BID  . insulin aspart  0-15 Units Subcutaneous 6 times per day   . magnesium oxide  400 mg Oral Daily  . pantoprazole sodium  40 mg Per Tube Q1200  . sodium chloride  10-40 mL Intracatheter Q12H  . spironolactone  25 mg Oral Daily  . Warfarin - Pharmacist Dosing Inpatient   Does not apply q1800   PRN:  acetaminophen, fentaNYL, hydrALAZINE, loperamide, nitroGLYCERIN, ondansetron (ZOFRAN) IV  Diet:  NPO, tube feeding Activity:  Up in chair DVT Prophylaxis:  IV heparin, warfarin  CLINICALLY SIGNIFICANT STUDIES Basic Metabolic Panel:   Recent Labs Lab 08/03/13 0434 08/04/13 0500  NA 145 145  K 3.4* 3.5*  CL 107 106  CO2 26 25  GLUCOSE 117* 122*  BUN 20 28*  CREATININE 0.82 0.99  CALCIUM 8.5 8.8   Liver Function Tests:  No results found for this basename: AST, ALT, ALKPHOS, BILITOT, PROT, ALBUMIN,  in the last 168 hours CBC:   Recent Labs Lab 08/03/13 0434 08/04/13 0500  WBC 8.9 8.9  HGB 8.7* 8.9*  HCT 27.2* 27.6*  MCV 91.9 92.0  PLT 434* 462*   Coagulation:   Recent Labs Lab 08/01/13 0415 08/02/13 0430 08/03/13 0434 08/04/13 0500  LABPROT 15.9* 17.8* 21.7* 21.9*  INR 1.30 1.51* 1.96* 1.98*  Cardiac Enzymes:  No results found for this basename: CKTOTAL, CKMB, CKMBINDEX, TROPONINI,  in the last 168 hours Urinalysis: No results found for this basename: COLORURINE, APPERANCEUR, LABSPEC, PHURINE, GLUCOSEU, HGBUR, BILIRUBINUR, KETONESUR, PROTEINUR, UROBILINOGEN, NITRITE, LEUKOCYTESUR,  in the last 168 hours Lipid Panel    Component Value Date/Time   CHOL 166 07/20/2013 0020   TRIG 96 07/20/2013 0020   HDL 63 07/20/2013 0020   CHOLHDL 2.6 07/20/2013 0020   VLDL 19 07/20/2013 0020   LDLCALC 84 07/20/2013 0020   HgbA1C  Lab Results  Component Value Date   HGBA1C 5.7* 07/19/2013    Urine Drug Screen:     Component Value Date/Time   LABOPIA NONE DETECTED 06/21/2008 1300   COCAINSCRNUR NONE DETECTED 06/21/2008 1300   LABBENZ NONE DETECTED 06/21/2008 1300   AMPHETMU NONE DETECTED 06/21/2008 1300   THCU NONE DETECTED  06/21/2008 1300   LABBARB  Value: NONE DETECTED        DRUG SCREEN FOR MEDICAL PURPOSES ONLY.  IF CONFIRMATION IS NEEDED FOR ANY PURPOSE, NOTIFY LAB WITHIN 5 DAYS. 06/21/2008 1300    Alcohol Level: No results found for this basename: ETH,  in the last 168 hours   CT of the brain   07/20/2013 1. New hypodensities in the left basal ganglia and left thalamus,  concerning for evolving acute infarcts. Smaller foci of hypoattenuation in the right caudate and thalamus may also represent acute ischemia.  2. New paranasal sinus mucosal disease. 07/19/2013    1. No acute intracranial process. 2. Very minimal amount of geographic subcortical encephalomalacia within in the anterior lateral aspect of the left temporal lobe, at the site of the patient's prior hemorrhagic contusion.   Cerebral Angiogram 07/20/2013 Rt CFa approach. Distal small filling defects in the the orbitofrontal br of RT MCA sup division,and tertiary angular division of RT MCA  MRI of the brain    2D Echocardiogram  EF 10%. Diffuse kypokinesis  TEE 07/21/2013 no evidence of clot but with RV failure.   CXR   07/28/2013 Tracheostomy tube with the tip 3 cm above the carina.   EKG  sinus tachycardia. For complete results please see formal report.   Therapy Recommendations LTACH, CIR  Physical Exam   Young african [place] male with tracheostomy. Afebrile. Head is nontraumatic. Neck is supple without bruit. Cardiac exam no murmur or gallop. Lungs are clear to auscultation. Distal pulses are well felt. LVAD right side abdomen Neurological Exam :  Awake  and follows commands well on R side. Pupils 3 mm sluggishly reactive. Doll's eye movements are present. .Eyes partially open and will blink to threat and follow gaze. Facial grimacing to pain. Spontaneous movement of RUE L sided hemiplegia but can move  Leg and toes and fingers now   slightly, following commands on R side with antigravity movements.   ASSESSMENT [name] is a 53 y.o.  male presenting with left sided weakness.  Imaging supports left basal ganglia and thalamic infarcts larger than right basal ganglia subcortical  infarcts. Infarcts cardioembolic secondary to potential sources of atrial fibrillation, LVAD clot and/or acute MI. TEE performed 07/21/2013 without source. On aspirin 81 mg orally every day, clopidogrel 75 mg orally every day and warfarin prior to admission. Now on aspirin 81 mg, Plavix 75 mg, IV heparin and warfarin for secondary stroke prevention. Patient with resultant left arm hemiplegia, left leg hemiparesis (improving), dysphagia.  Respiratory failure, extubated 07/25/2013 reintubated same day to protect airway, post profuse oral bleeding,  -  IV heparin, aspirin and plavix on hold. Trach placement delayed secondary to elevated INR 2.09. FFP given prior to trach placement. Trach placxed 07/28/2013 by ENT without difficulty. On trach collar now. Dysphagia secondary to stroke. PEG placement attempted but pt was believed to be high risk. Recommended to continue PANDA. surgeon will not even re-consider PEG for at least 1 month. Seen by speech therapist this morning with significant coughing.   Systolic HF, EF 10%, has LVAD. On transplant list prior to admission. Given new stroke, no longer a transplant candidate.  new onset atrial fibrillation with RVR. On amiodarone. Back in NSR  NSTEMI - Elevated troponins - will manage medically hypertension Chronic RI Hyperlipidemia, LDL 84, on no statin PTA, now on no statin, at goal LDL < 100  obstructive sleep apnea Hx migraines Bipolar affective disorder Ejection fraction 15-20%. No obvious clot was noted.  Hypokalemia, Potassium supplementation   Hospital day # 18  TREATMENT/PLAN  Continue Coumadin with IV heparin bridge, aspirin and plavix   May consider PEG in the future, depending on swallow function  Not a rehab candate  [facility], care  No further stroke workup  indicated. Patient has a 10-15% risk of having another stroke over the next year, the highest risk is within 2 weeks of the most recent stroke/TIA (risk of having a stroke following a stroke or TIA is the same). Ongoing risk factor control by Primary Care Physician Stroke Service will sign off. Please call should any needs arise.  Follow up with Dr. Pearlean BrownieSethi, Stroke Clinic, in 2 months.  Annie MainSHARON BIBY, MSN, RN, ANVP-BC, ANP-BC, Lawernce IonGNP-BC [facility] Pager: [phone] 08/04/2013 9:13 AM  I have personally obtained a history, examined the patient, evaluated imaging results, and formulated the assessment and plan of care. I agree with the above.  Elspeth ChoPeter Nahima Ales, DO Stroke-Neurology

## 2013-08-04 NOTE — Progress Notes (Signed)
ANTICOAGULATION CONSULT NOTE - Follow Up Consult  Pharmacy Consult for Heparin, resume Coumadin Indication: LVAD thrombosis/CVA    Allergies  Allergen Reactions  . Ace Inhibitors Cough  . Lexapro [Escitalopram Oxalate] Other (See Comments)    somnolence    Patient Measurements: Height: 5\' 4"  (162.6 cm) Weight: 189 lb 2.5 oz (85.8 kg) IBW/kg (Calculated) : 59.2 Heparin Dosing Weight: 77kg  Vital Signs: Temp: 98.3 F (36.8 C) (02/11 0758) Temp src: Oral (02/11 0758) BP: 99/75 mmHg (02/11 0410) Pulse Rate: 107 (02/11 1000)  Labs:  Recent Labs  08/02/13 0430 08/03/13 0434 08/04/13 0500  HGB 8.9* 8.7* 8.9*  HCT 27.9* 27.2* 27.6*  PLT 412* 434* 462*  LABPROT 17.8* 21.7* 21.9*  INR 1.51* 1.96* 1.98*  HEPARINUNFRC 0.41 0.39 0.47  CREATININE 0.87 0.82 0.99    Estimated Creatinine Clearance: 88.1 ml/min (by C-G formula based on Cr of 0.99).  . sodium chloride 20 mL/hr at 08/04/13 0600  . feeding supplement (OSMOLITE 1.5 CAL)    . heparin 1,200 Units/hr (08/04/13 1000)  . nitroGLYCERIN Stopped (07/17/13 1800)    Assessment: 52yom with LVAD, suspected pump thrombosis and acute CVA continues on heparin bridge to warfarin. Heparin level remains therapeutic at 0.47 heaprin drip 1200 uts/hr. H/H low stable 8.9/27, Plts stable. Slightly amber colored urine,  clear secretions. Patient at high risk for PEG or J-tube, Panda tube continued.  INR 1.98- started to bump after Coumadin 10mg  x 2 but leveled off after 5mg  yesterday  Patient now off amio (1/27-2/6)  Goal of Therapy:  Heparin level 0.3-0.5 units/ml Monitor platelets by anticoagulation protocol: Yes INR 2.0-3.0   Plan:  Continue heparin drip 1200 units/hr (12 ml/hr) Will continue to monitor bleeding Coumadin 10 mg x 1 tonight Daily heparin level, CBC, and INR   Leota Sauers Pharm.D. CPP, BCPS Clinical Pharmacist [phone] 08/04/2013 11:48 AM

## 2013-08-04 NOTE — Progress Notes (Signed)
SPEECH-LANGUAGE PATHOLOGY  Per chart review, PCCM to see Friday to determine readiness for trach downsize.  Will likely have greater success with PMSV after trach change.  Will follow for appropriateness.  Javonna Balli L. Samson Frederic, [place] CCC/SLP Pager [phone]

## 2013-08-05 DIAGNOSIS — I214 Non-ST elevation (NSTEMI) myocardial infarction: Secondary | ICD-10-CM

## 2013-08-05 LAB — BASIC METABOLIC PANEL
BUN: 20 mg/dL (ref 6–23)
CHLORIDE: 106 meq/L (ref 96–112)
CO2: 25 meq/L (ref 19–32)
CREATININE: 0.82 mg/dL (ref 0.50–1.35)
Calcium: 8.7 mg/dL (ref 8.4–10.5)
GFR calc Af Amer: >90 mL/min (ref >90)
GFR calc non Af Amer: >90 mL/min (ref >90)
Glucose, Bld: 141 mg/dL — ABNORMAL HIGH (ref 70–99)
Potassium: 3.6 mEq/L — ABNORMAL LOW (ref 3.7–5.3)
Sodium: 145 mEq/L (ref 137–147)

## 2013-08-05 LAB — GLUCOSE, CAPILLARY
GLUCOSE-CAPILLARY: 128 mg/dL — AB (ref 70–99)
GLUCOSE-CAPILLARY: 114 mg/dL — AB (ref 70–99)
Glucose-Capillary: 106 mg/dL — ABNORMAL HIGH (ref 70–99)
Glucose-Capillary: 97 mg/dL (ref 70–99)
Glucose-Capillary: 82 mg/dL (ref 70–99)
Glucose-Capillary: 102 mg/dL — ABNORMAL HIGH (ref 70–99)

## 2013-08-05 LAB — CBC
HCT: 28.2 % — ABNORMAL LOW (ref 39.0–52.0)
Hemoglobin: 9.1 g/dL — ABNORMAL LOW (ref 13.0–17.0)
MCH: 29.8 pg (ref 26.0–34.0)
MCHC: 32.3 g/dL (ref 30.0–36.0)
MCV: 92.5 fL (ref 78.0–100.0)
Platelets: 428 10*3/uL — ABNORMAL HIGH (ref 150–400)
RBC: 3.05 MIL/uL — ABNORMAL LOW (ref 4.22–5.81)
RDW: 16.1 % — AB (ref 11.5–15.5)
WBC: 7.5 10*3/uL (ref 4.0–10.5)

## 2013-08-05 LAB — PROTIME-INR
INR: 2.02 — AB (ref 0.00–1.49)
Prothrombin Time: 22.2 seconds — ABNORMAL HIGH (ref 11.6–15.2)

## 2013-08-05 LAB — LACTATE DEHYDROGENASE: LDH: 982 U/L — ABNORMAL HIGH (ref 94–250)

## 2013-08-05 LAB — CARBOXYHEMOGLOBIN
Carboxyhemoglobin: 2.3 % — ABNORMAL HIGH (ref 0.5–1.5)
Methemoglobin: 1.6 % — ABNORMAL HIGH (ref 0.0–1.5)
O2 SAT: 59.7 %
TOTAL HEMOGLOBIN: 9.9 g/dL — AB (ref 13.5–18.0)

## 2013-08-05 LAB — HEPARIN LEVEL (UNFRACTIONATED): HEPARIN UNFRACTIONATED: 0.42 [IU]/mL (ref 0.30–0.70)

## 2013-08-05 MED ORDER — MAGNESIUM HYDROXIDE 400 MG/5ML PO SUSP: 30.0000 mL | ORAL | Status: AC

## 2013-08-05 MED ORDER — HEPARIN (PORCINE) IN NACL 100-0.45 UNIT/ML-% IJ SOLN
1200.0000 [IU]/h | INTRAMUSCULAR | Status: DC
  Administered 2013-08-05: 1200 [IU]/h via INTRAVENOUS
  Administered 2013-08-06: 1300 [IU]/h via INTRAVENOUS
  Administered 2013-08-07 – 2013-08-08 (×2): 1200 [IU]/h via INTRAVENOUS
  Filled 2013-08-05 (×7): qty 250

## 2013-08-05 MED ORDER — WARFARIN SODIUM 7.5 MG PO TABS
7.5000 mg | ORAL_TABLET | Freq: Once | ORAL | Status: AC
  Administered 2013-08-05: 7.5 mg via ORAL
  Filled 2013-08-05: qty 1

## 2013-08-05 NOTE — Progress Notes (Signed)
Occupational Therapy Treatment Patient Details Name: [name] MRN: [id number] DOB: 04/30/1961 Today's Date: 08/05/2013 Time: [id number] OT Time Calculation (min): 54 min  OT Assessment / Plan / Recommendation  History of present illness [name] is 53 y/o male with a history of severe CHF due to NICM EF 10-15%, and severe MR [facility]/p [facility]. His medical history also consists of CRI, NSVT s/p ICD and LV thrombus. Admitted 1/24 with NSTEMI felt secondary due embolic phenomenon due to pump thrombosis.  LDH high around 900.  Extubated 1/30. Reintubated 2/2 due to inability control secretions. Trach 2/4 and on trach collar this am. Smiling and denies any pain. MAPs 80-90s on levophed gtt 2 mcg. No further AF on amio. Pending PEG today - heparin on hold. Starting to move L side minimally.    OT comments  Pt continues to demonstrate improvements in trunk control, balance, ability to use Lt. UE as a stabilizer, functional transfers, and activity tolerance.  Pt. Worked with OT for greater than 45 mins without rest breaks, and then transferred to chair.  He requires +2 assistance only for line management, and currently is mod A for transfers.  Mod - max A with BADLs.  He is very motivated and making nice progress.  Continue to recommend CIR.   Follow Up Recommendations  CIR;Supervision/Assistance - 24 hour    Barriers to Discharge       Equipment Recommendations  3 in 1 bedside comode    Recommendations for Other Services Rehab consult  Frequency Min 3X/week   Progress towards OT Goals Progress towards OT goals: Progressing toward goals  Plan Discharge plan remains appropriate    Precautions / Restrictions Precautions Precautions: Fall   Pertinent Vitals/Pain     ADL  Eating/Feeding: NPO Toilet Transfer: Moderate assistance (+1 for lines and safety ) Transfers/Ambulation Related to ADLs: mod A - pt with motor planning deficits and difficulty advancing Rt LE.  Requires  +1 for safety to manage lines ADL Comments: Worked on facilitation of trunk control and balance while EOB.  Pt maintained Lt. UE as a stabilizer with min A while reaching with Rt. UE in all planes including rotation to Lt. and reaching >6" off BOS. Pt requires no assist for balance.  Worked on sit to partial stand with Lt. UE in weightbearing with mod facilitation.    OT Diagnosis:    OT Problem List:   OT Treatment Interventions:     OT Goals(current goals can now be found in the care plan section) ADL Goals Pt Will Perform Grooming: with supervision;sitting Pt Will Perform Upper Body Bathing: with min assist;sitting Pt Will Perform Lower Body Bathing: sit to/from stand;with mod assist Pt Will Perform Upper Body Dressing: sitting;with mod assist Pt Will Perform Lower Body Dressing: with max assist;sit to/from stand Pt Will Transfer to Toilet: with min assist;stand pivot transfer;bedside commode Pt Will Perform Toileting - Clothing Manipulation and hygiene: with mod assist;sit to/from stand Additional ADL Goal #1: Pt will use Lt. UE as a stabilizer during BADLs Additional ADL Goal #2: Pt will be able to manage LVAD equipment with mod A  Visit Information  Last OT Received On: 08/05/13 Assistance Needed: +2 History of Present Illness: [name] is 53 y/o male with a history of severe CHF due to NICM EF 10-15%, and severe MR s/p HM [facility]. His medical history also consists of CRI, NSVT s/p ICD and LV thrombus. Admitted 1/24 with NSTEMI  felt secondary due embolic phenomenon due to pump thrombosis.  LDH high around 900.  Extubated 1/30. Reintubated 2/2 due to inability control secretions. Trach 2/4 and on trach collar this am. Smiling and denies any pain. MAPs 80-90s on levophed gtt 2 mcg. No further AF on amio. Pending PEG today - heparin on hold. Starting to move L side minimally.     Subjective Data      Prior Functioning       Cognition   Cognition Arousal/Alertness: Awake/alert Behavior During Therapy: Flat affect Overall Cognitive Status: Within Functional Limits for tasks assessed (no obvious cognitive deficits with basic tasks)    Mobility  Bed Mobility Overal bed mobility: Needs Assistance Bed Mobility: Sit to Supine Supine to sit: Mod assist General bed mobility comments: Assist to move Lt. LE off EOB and min facilitation to lift trunk  Transfers Overall transfer level: Needs assistance Equipment used: None Transfers: Sit to/from [employer] Sit to Stand: Mod assist Stand pivot transfers: Mod assist;+2 safety/equipment General transfer comment: facilitation through Lt. hip and rib cage.  Difficulty advancing Rt. LE - was able to advance Lt. LE - component of apraxia    Exercises      Balance Balance Overall balance assessment: Needs assistance Sitting-balance support: Single extremity supported;Feet supported Sitting balance-Leahy Scale: Good Sitting balance - Comments: EOB reaching > 6" off BOS and with rotational reaching to Lt. (with Rt. UE Standing balance support: Bilateral upper extremity supported Standing balance-Leahy Scale: Fair  End of Session OT - End of Session Activity Tolerance: Patient tolerated treatment well Patient left: in chair;with call bell/phone within reach Nurse Communication: Mobility status  GO     Jeani Hawking M 08/05/2013, 3:42 PM

## 2013-08-05 NOTE — Progress Notes (Signed)
Patient ID: [name], male   DOB: [birth year], 53 y.o.   MRN: [id number] Advanced Heart Failure Rounding Note  [name] is 53 y/o male with a history of severe CHF due to NICM EF 10-15%, and severe MR s/p HM II LVAD placed at [facility] March 2013. His medical history also consists of CRI, NSVT s/p ICD and LV thrombus.   Admitted 1/24 with NSTEMI felt secondary due embolic phenomenon due to pump thrombosis.  LDH high around 900.  On way to cath lab 1/26 developed acute onset of aphasia and left-sided hemiparesis. CT negative for bleed. Taken emergently to neuro-interventional suite but no amenable lesions. Remains intubated on levophed. Remains on propofol gtt, unable to assess neuro status.  S/P TEE 1/28. No evidence of clot but with RV failure.   Extubated 1/30. Reintubated 2/2 due to inability control secretions. Trach placed due to difficulty with secretions and on trach collar this am.  Off norepinephrine. Map 80-90s .  Sitting in chair. Able to move L leg more. Denies dyspnea. No BM for many days. Urine remains darker. LDH climbing.   Hgb 11.6> 9.8>9.0> 8.5>9.7>9.3>8.9>8.7>8.9>9.1  LDH:  976>1568>1165>1124>937>870>762>787>805>753>977>982 Co-ox: 62%>57%>63%>63%>70%>71% >67%>59>60% INR 1.4>1.69>1.3>1.5 >1.96>2.02  VAD interrogated personally.  Flow 5.5, Speed 9200 PI 5.8,  Power 7.0   Events: occasioanl PI events  Filed Vitals:   08/05/13 1206 08/05/13 1208 08/05/13 1529 08/05/13 1609  BP:      Pulse: 110  107   Temp:  98.5 F (36.9 C)  98.6 F (37 C)  TempSrc:  Oral  Oral  Resp: 25  24   Height:      Weight:      SpO2: 98%  98%     Intake/Output Summary (Last 24 hours) at 08/05/13 1824 Last data filed at 08/05/13 1200  Gross per 24 hour  Intake   1422 ml  Output    350 ml  Net   1072 ml   Scheduled Meds: . allopurinol  100 mg Oral Daily  . antiseptic oral rinse  15 mL Mouth Rinse QID  . aspirin  81 mg Oral Daily  . chlorhexidine  15 mL Mouth Rinse BID  . clopidogrel  75 mg  Oral Q breakfast  . etomidate  40 mg Intravenous Once  . feeding supplement (PRO-STAT SUGAR FREE 64)  30 mL Per Tube QID  . glycopyrrolate  1 mg Per Tube BID  . insulin aspart  0-15 Units Subcutaneous 6 times per day  . magnesium oxide  400 mg Oral Daily  . pantoprazole sodium  40 mg Per Tube Q1200  . sodium chloride  10-40 mL Intracatheter Q12H  . spironolactone  25 mg Oral Daily  . Warfarin - Pharmacist Dosing Inpatient   Does not apply q1800   Continuous Infusions: . sodium chloride 20 mL/hr at 08/05/13 1200  . feeding supplement (OSMOLITE 1.5 CAL) 1,000 mL (08/05/13 1200)  . heparin 1,200 Units/hr (08/05/13 1630)  . nitroGLYCERIN Stopped (07/17/13 1800)   PRN Meds:.acetaminophen, fentaNYL, hydrALAZINE, loperamide, nitroGLYCERIN, ondansetron (ZOFRAN) IV  LABS: Basic Metabolic Panel:  Recent Labs  35/36/14 0500 08/05/13 0409  NA 145 145  K 3.5* 3.6*  CL 106 106  CO2 25 25  GLUCOSE 122* 141*  BUN 28* 20  CREATININE 0.99 0.82  CALCIUM 8.8 8.7   Liver Function Tests: No results found for this basename: AST, ALT, ALKPHOS, BILITOT, PROT, ALBUMIN,  in the last 72 hours No results found for this basename: LIPASE, AMYLASE,  in the last 72  hours CBC:  Recent Labs  08/04/13 0500 08/05/13 0409  WBC 8.9 7.5  HGB 8.9* 9.1*  HCT 27.6* 28.2*  MCV 92.0 92.5  PLT 462* 428*   Cardiac Enzymes: No results found for this basename: CKTOTAL, CKMB, CKMBINDEX, TROPONINI,  in the last 72 hours RADIOLOGY: Dg Chest Portable 1 View  07/17/2013   CLINICAL DATA:  Left-sided chest pain for 3 days, weakness and dizziness.  EXAM: PORTABLE CHEST - 1 VIEW  COMPARISON:  08/03/2012  FINDINGS: Evidence of median sternotomy noted with LVAD in place. Left-sided defibrillator noted. Mild prominence of the cardiac silhouette persists. Tricuspid valvuloplasty reidentified. Trace left pleural fluid or thickening persists. No new pulmonary opacity.  IMPRESSION: No new acute abnormality. Stable appearance  of trace left pleural fluid or thickening.   Electronically Signed   By: Christiana PellantGretchen  Green M.D.   On: 07/17/2013 10:12    PHYSICAL EXAM- CVP ~12 General: NAD; on trach collar;  Neck: JVP 11; trach Lungs: mild rhonchi CV: LVAD pump hum normal.   Abdomen: Soft, nontender, no hepatosplenomegaly, mild distention. Hypoactive BS Neurologic: awake interactive. Expressive aphasia. Moving left leg, slight movement left hand.   Extremities: No clubbing or cyanosis.  1+ edema RUE PICC  TELEMETRY: SR 90s    Assessment:   1. Acute embolic CVA 2. NSTEMI - Likely pump thrombosis with coronary embolus.  2. Chronic systolic HF s/p HM II VAD  3. NICM EF 15%  4. Hyperkalemia  5. HTN  6. Acute respiratory failure 7. Shock - suspect mixed picture RV failure/vasodilatory 8. Oral bleeding  Plan/Discussion:    He has had NSTEMI and acute CVA due to pump thrombosis/embolism. Now with expressive aphasia L hemiplegia. Making slow progress. Initially refused for CIR but I talked to Dr. Hermelinda MedicusSchwartz today to discuss his progress and they will stay on board and continue to re-evaluate his candidacy as he improves. Given his complexity I would favor having him at CIR so we can follow closely if at all possible. Continue PT.   INR therapeutic but LDH is trending up and UA with bili - concerning for persistent pump thrombosis. Will continue heparin, coumadin, plavix, and asa for now. Overlap heparin/coumadin at least another 24 hours. If LDH continues to climb will need to discuss possibility of pump exchange with [facility]. Obviously, this is complicated by recent CVA and NSTEMI.   Continues on trach collar this am.  Secretions better with Robinul and no epistaxis now. Deemed too high risk currently for PEG or J-tube.  To keep panda tube for now.   Hemodynamically stable of inotropes. Through RV still very tenuous. Needs CVP 12-15 due to RHF.  Needs laxative.   I reviewed the LVAD parameters from today, and compared the  results to the patient's prior recorded data. No programming changes were made. The LVAD is functioning within specified parameters. The patient performs LVAD self-test daily. LVAD interrogation was negative for any significant power changes, alarms or PI events/speed drops. LVAD equipment check completed and is in good working order. Back-up equipment present. LVAD education done on emergency procedures and precautions and reviewed exit site care.  Truman HaywardDaniel Kaneisha Ellenberger,MD 6:24 PM

## 2013-08-05 NOTE — Progress Notes (Signed)
Patient ID: [name], male   DOB: 22-Jul-1960, 53 y.o.   MRN: [id number] Advanced Heart Failure Rounding Note  [name] is 53 y/o male with a history of severe CHF due to NICM EF 10-15%, and severe MR s/p HM II LVAD placed at [facility] March 2013. His medical history also consists of CRI, NSVT s/p ICD and LV thrombus.   Admitted 1/24 with NSTEMI felt secondary due embolic phenomenon due to pump thrombosis.  LDH high around 900.  On way to cath lab 1/26 developed acute onset of aphasia and left-sided hemiparesis. CT negative for bleed. Taken emergently to neuro-interventional suite but no amenable lesions. Remains intubated on levophed. Remains on propofol gtt, unable to assess neuro status.  S/P TEE 1/28. No evidence of clot but with RV failure.   Extubated 1/30. Reintubated 2/2 due to inability control secretions.   Trach placed due to difficulty with secretions and on trach collar this am.  Off norepinephrine. Map 80-90s . On heparin.   Able to move L leg more. Hemodynamically stable. Urine darker today. LDH going up.   Hgb 11.6> 9.8>9.0> 8.5>9.7>9.3>8.9>8.7>8.9  LDH:  [id number]>[id number]>1124>937>870>762>787>805>753>977 Co-ox: 62%>57%>63%>63%>70%>71% >67%>59 INR 1.4>1.69>1.3>1.5 >1.96  VAD interrogated personally.  Flow 6.3, Speed 9200 PI 6.1,  Power 5.2    Events: occasioanl PI events  Filed Vitals:   08/05/13 1206 08/05/13 1208 08/05/13 1529 08/05/13 1609  BP:      Pulse: 110  107   Temp:  98.5 F (36.9 C)  98.6 F (37 C)  TempSrc:  Oral  Oral  Resp: 25  24   Height:      Weight:      SpO2: 98%  98%     Intake/Output Summary (Last 24 hours) at 08/05/13 1821 Last data filed at 08/05/13 1200  Gross per 24 hour  Intake   1422 ml  Output    350 ml  Net   1072 ml   Scheduled Meds: . allopurinol  100 mg Oral Daily  . antiseptic oral rinse  15 mL Mouth Rinse QID  . aspirin  81 mg Oral Daily  . chlorhexidine  15 mL Mouth Rinse BID  . clopidogrel  75 mg Oral Q breakfast  . etomidate   40 mg Intravenous Once  . feeding supplement (PRO-STAT SUGAR FREE 64)  30 mL Per Tube QID  . glycopyrrolate  1 mg Per Tube BID  . insulin aspart  0-15 Units Subcutaneous 6 times per day  . magnesium oxide  400 mg Oral Daily  . pantoprazole sodium  40 mg Per Tube Q1200  . sodium chloride  10-40 mL Intracatheter Q12H  . spironolactone  25 mg Oral Daily  . Warfarin - Pharmacist Dosing Inpatient   Does not apply q1800   Continuous Infusions: . sodium chloride 20 mL/hr at 08/05/13 1200  . feeding supplement (OSMOLITE 1.5 CAL) 1,000 mL (08/05/13 1200)  . heparin 1,200 Units/hr (08/05/13 1630)  . nitroGLYCERIN Stopped (07/17/13 1800)   PRN Meds:.acetaminophen, fentaNYL, hydrALAZINE, loperamide, nitroGLYCERIN, ondansetron (ZOFRAN) IV  LABS: Basic Metabolic Panel:  Recent Labs  76/22/63 0500 08/05/13 0409  NA 145 145  K 3.5* 3.6*  CL 106 106  CO2 25 25  GLUCOSE 122* 141*  BUN 28* 20  CREATININE 0.99 0.82  CALCIUM 8.8 8.7   Liver Function Tests: No results found for this basename: AST, ALT, ALKPHOS, BILITOT, PROT, ALBUMIN,  in the last 72 hours No results found for this basename: LIPASE, AMYLASE,  in the last 72 hours  CBC:  Recent Labs  08/04/13 0500 08/05/13 0409  WBC 8.9 7.5  HGB 8.9* 9.1*  HCT 27.6* 28.2*  MCV 92.0 92.5  PLT 462* 428*   Cardiac Enzymes: No results found for this basename: CKTOTAL, CKMB, CKMBINDEX, TROPONINI,  in the last 72 hours RADIOLOGY: Dg Chest Portable 1 View  07/17/2013   CLINICAL DATA:  Left-sided chest pain for 3 days, weakness and dizziness.  EXAM: PORTABLE CHEST - 1 VIEW  COMPARISON:  08/03/2012  FINDINGS: Evidence of median sternotomy noted with LVAD in place. Left-sided defibrillator noted. Mild prominence of the cardiac silhouette persists. Tricuspid valvuloplasty reidentified. Trace left pleural fluid or thickening persists. No new pulmonary opacity.  IMPRESSION: No new acute abnormality. Stable appearance of trace left pleural fluid or  thickening.   Electronically Signed   By: Christiana PellantGretchen  Green M.D.   On: 07/17/2013 10:12    PHYSICAL EXAM- CVP ~15  General: NAD; on trach collar;  Neck: JVP 11; trach Lungs: Diffuse rhonchi CV: LVAD pump hum normal.   Abdomen: Soft, nontender, no hepatosplenomegaly, no distention. Hypoactive BS Neurologic: awake interactive. Expressive aphasia. Moving left foot, slight movement left hand.   Extremities: No clubbing or cyanosis. LUE 1+ edema RUE PICC  TELEMETRY: SR 90s    Assessment:   1. Acute embolic CVA 2. NSTEMI - Likely pump thrombosis with coronary embolus.  2. Chronic systolic HF s/p HM II VAD  3. NICM EF 15%  4. Hyperkalemia  5. HTN  6. Acute respiratory failure 7. Shock - suspect mixed picture RV failure/vasodilatory 8. Oral bleeding  Plan/Discussion:    He has had NSTEMI and acute CVA due to pump thrombosis/embolism. Now with expressive aphasia L hemiplegia. Neurology has suggested that speech and ability to control secretions will return in several months but L-sided plegia may not recover.   Hemoglobin drifting down. For now continue  ASA and Plavix.  He will continue heparin bridge to therapeutic INR.  LDH trending back up and urine darker - concern for persistent pump thrombosis.   Continues on trach collar this am.  Secretions better with Robinul and no epistaxis now. Deemed too high risk currently for PEG or J-tube.  To keep panda tube for now.   Hemodynamically stable of inotropes. Needs CVP 12-15 due to RHF. Maintaining SR off amiodarone. Agree with heparin/coumadin overlap for at least one more day. D/w Pharmacy.  From neuro perspective he is making rapid strides over past 24-48 hours. Now able to lift L leg off bed and wiggle L hand. Long talk with Stroke team about situation. They feel he is candidate for CIR. However he has been evaluated fro CIR and concern from CIR team is that there are too many medical issues currently to permit 3-4 hours of PT per  day. Given his complexity I would favor having him at CIR so we can follow closely if at all possible. I think the best plan is to keep him in house for 3-4 more days and see how he progresses. At his current rate of improvement I am hopeful that he may progress to the point where he can be a CIR candidate. If not, will consider transfer to [facility] with Dr. Angelina Okrowley.    I reviewed the LVAD parameters from today, and compared the results to the patient's prior recorded data. No programming changes were made. The LVAD is functioning within specified parameters. The patient performs LVAD self-test daily. LVAD interrogation was negative for any significant power changes, alarms or PI events/speed  drops. LVAD equipment check completed and is in good working order. Back-up equipment present. LVAD education done on emergency procedures and precautions and reviewed exit site care. Truman Hayward 6:21 PM

## 2013-08-05 NOTE — Progress Notes (Signed)
ANTICOAGULATION CONSULT NOTE - Follow Up Consult  Pharmacy Consult for Heparin,Coumadin Indication: LVAD thrombosis/CVA    Allergies  Allergen Reactions  . Ace Inhibitors Cough  . Lexapro [Escitalopram Oxalate] Other (See Comments)    somnolence    Patient Measurements: Height: 5\' 4"  (162.6 cm) Weight: 191 lb 2.2 oz (86.7 kg) IBW/kg (Calculated) : 59.2 Heparin Dosing Weight: 77kg  Vital Signs: Temp: 98.6 F (37 C) (02/12 0724) Temp src: Oral (02/12 0724) Pulse Rate: 103 (02/12 1000)  Labs:  Recent Labs  08/03/13 0434 08/04/13 0500 08/05/13 0409  HGB 8.7* 8.9* 9.1*  HCT 27.2* 27.6* 28.2*  PLT 434* 462* 428*  LABPROT 21.7* 21.9* 22.2*  INR 1.96* 1.98* 2.02*  HEPARINUNFRC 0.39 0.47 0.42  CREATININE 0.82 0.99 0.82    Estimated Creatinine Clearance: 107 ml/min (by C-G formula based on Cr of 0.82).  . sodium chloride 20 mL/hr at 08/05/13 1000  . feeding supplement (OSMOLITE 1.5 CAL) 1,000 mL (08/05/13 1000)  . heparin 1,200 Units/hr (08/05/13 1000)  . nitroGLYCERIN Stopped (07/17/13 1800)    Assessment: 52yom with LVAD, suspected pump thrombosis and acute CVA continues on heparin bridge to warfarin. Heparin level remains therapeutic at 0.4 heparin drip 1200 uts/hr. H/H low stable 9.1/28, Plts stable. Significantly amber colored urine, clear secretions. Patient too high risk for PEG or J-tube, Panda tube continued.  INR 2.0- will stop heparin bridge today and continue warfarin. No warfarin dose charted last night so will assume that no dose was given and give a little extra tonight.  Patient now off amio (1/27-2/6)  Goal of Therapy:  Heparin level 0.3-0.5 units/ml Monitor platelets by anticoagulation protocol: Yes INR 2.0-3.0   Plan:  D/c heparin drip  Will continue to monitor bleeding Coumadin 7.5 mg x 1 tonight Daily CBC, and INR   Sheppard Coil PharmD., BCPS Clinical Pharmacist Pager [phone] 08/05/2013 12:15 PM

## 2013-08-06 LAB — BASIC METABOLIC PANEL
BUN: 20 mg/dL (ref 6–23)
CO2: 26 meq/L (ref 19–32)
CREATININE: 0.85 mg/dL (ref 0.50–1.35)
Calcium: 8.6 mg/dL (ref 8.4–10.5)
Chloride: 106 mEq/L (ref 96–112)
GFR calc Af Amer: >90 mL/min (ref >90)
GFR calc non Af Amer: >90 mL/min (ref >90)
GLUCOSE: 129 mg/dL — AB (ref 70–99)
Potassium: 3.7 mEq/L (ref 3.7–5.3)
Sodium: 142 mEq/L (ref 137–147)

## 2013-08-06 LAB — PROTIME-INR
INR: 1.98 — ABNORMAL HIGH (ref 0.00–1.49)
Prothrombin Time: 21.9 seconds — ABNORMAL HIGH (ref 11.6–15.2)

## 2013-08-06 LAB — CBC
HCT: 27.1 % — ABNORMAL LOW (ref 39.0–52.0)
Hemoglobin: 8.5 g/dL — ABNORMAL LOW (ref 13.0–17.0)
MCH: 29.1 pg (ref 26.0–34.0)
MCHC: 31.4 g/dL (ref 30.0–36.0)
MCV: 92.8 fL (ref 78.0–100.0)
PLATELETS: 408 10*3/uL — AB (ref 150–400)
RBC: 2.92 MIL/uL — AB (ref 4.22–5.81)
RDW: 16 % — ABNORMAL HIGH (ref 11.5–15.5)
WBC: 7.3 10*3/uL (ref 4.0–10.5)

## 2013-08-06 LAB — HEPARIN LEVEL (UNFRACTIONATED)
HEPARIN UNFRACTIONATED: 0.58 [IU]/mL (ref 0.30–0.70)
Heparin Unfractionated: 0.28 IU/mL — ABNORMAL LOW (ref 0.30–0.70)

## 2013-08-06 LAB — GLUCOSE, CAPILLARY
GLUCOSE-CAPILLARY: 107 mg/dL — AB (ref 70–99)
GLUCOSE-CAPILLARY: 113 mg/dL — AB (ref 70–99)
GLUCOSE-CAPILLARY: 117 mg/dL — AB (ref 70–99)
Glucose-Capillary: 131 mg/dL — ABNORMAL HIGH (ref 70–99)
Glucose-Capillary: 103 mg/dL — ABNORMAL HIGH (ref 70–99)
Glucose-Capillary: 95 mg/dL (ref 70–99)

## 2013-08-06 LAB — CARBOXYHEMOGLOBIN
CARBOXYHEMOGLOBIN: 2.2 % — AB (ref 0.5–1.5)
Methemoglobin: 0.7 % (ref 0.0–1.5)
O2 Saturation: 65.4 %
Total hemoglobin: 9 g/dL — ABNORMAL LOW (ref 13.5–18.0)

## 2013-08-06 LAB — LACTATE DEHYDROGENASE: LDH: 966 U/L — ABNORMAL HIGH (ref 94–250)

## 2013-08-06 MED ORDER — WARFARIN SODIUM 10 MG PO TABS
10.0000 mg | ORAL_TABLET | Freq: Once | ORAL | Status: AC
  Administered 2013-08-06: 10 mg via ORAL
  Filled 2013-08-06: qty 1

## 2013-08-06 MED ORDER — FUROSEMIDE 8 MG/ML PO SOLN
40.0000 mg | Freq: Two times a day (BID) | ORAL | Status: DC
  Filled 2013-08-06 (×3): qty 5

## 2013-08-06 NOTE — Progress Notes (Signed)
Pulmonary/Critical Care Progress Note   Name: Eric LovelyUndra [name] MRN: [id number] DOB: 06/03/1961    ADMISSION DATE:  07/17/2013 CONSULTATION DATE:  1/26  REFERRING MD :  Bensimhon  CHIEF COMPLAINT:  Vent weaning and hypotension  BRIEF PATIENT DESCRIPTION:  This is a 53 year old male w/ known h/o NICM EF 10-15% w/ severe MR. He [facility], as bridge to transplant. Admitted for evaluation of new NSTEMI felt possibly to be related to pump thrombosis. Hosp course complicated by episodes of CP and hypotension on 1/24 pm hours. 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 PCCM asked to see post-op for vent weaning.    SIGNIFICANT EVENTS / STUDIES:  1/24: admitted w/ CP and trop rise. Did have sub-therapeutic INR at home so concern was for pump thrombosis.  1/24: evening had CP, rising Trop I, got mso4 and NTG>>>got hypotensive requiring pressors.  1/25 INR supra-therapeutic. Coumadin stopped, heparin started 1/26: CT head: negative for bleed.  1/26:cerebral arteriogram:  acute right MCA embolic CVA. Not amendable to extraction due to distal location.  1/30 Extubated but airway protection is questionable at best 2/01 Reintubated 2/03 ENT eval Jearld Fenton(Byers) clots suctioned from mouth - no active bleeding site 2/05 Off vent 2/13 Change to #6 cuffless trach  LINES / TUBES: LVAD OETT 1/26>>>1/30, 2/1 >>2/4 Trach (DF) 2/4 >> Right PICC 1/25>>>  CULTURES: Resp 1/27 >> few GNR >> normal flora  ANTIBIOTICS: 1/27 zosyn (aspiration, GNR) >> 2/5  SUBJECTIVE:  Still has cough, but easier to exptectorate.  VITAL SIGNS: Temp:  [98.4 F (36.9 C)-100.8 F (38.2 C)] 98.4 F (36.9 C) (02/13 0722) Pulse Rate:  [102-117] 103 (02/13 0900) Resp:  [17-29] 19 (02/13 0900) SpO2:  [90 %-100 %] 97 % (02/13 0900) Arterial Line BP: (81-106)/(65-91) 91/77 mmHg (02/13 0700) FiO2 (%):  [28 %] 28 % (02/13 0900) Weight:  [189 lb 9.5 oz (86 kg)] 189 lb 9.5  oz (86 kg) (02/13 0300) HEMODYNAMICS: CVP:  [11 mmHg-13 mmHg] 13 mmHg TC 28% INTAKE / OUTPUT: Intake/Output     02/12 0701 - 02/13 0700 02/13 0701 - 02/14 0700   I.V. (mL/kg) 768 (8.9) 66 (0.8)   NG/GT 1050 80   Total Intake(mL/kg) 1818 (21.1) 146 (1.7)   Urine (mL/kg/hr) 320 (0.2)    Total Output 320     Net +1498 +146        Urine Occurrence 1 x     PHYSICAL EXAMINATION: General:  No distress, sitting in chair Neuro: alert, follows commands HEENT: trach site clean Cardiovascular:  regular Lungs: scattered rhonchi, no wheeze Abdomen:  Soft, nontender  Musculoskeletal:  Intact  Skin:  Intact   LABS:  CBC  Recent Labs Lab 08/04/13 0500 08/05/13 0409 08/06/13 0430  WBC 8.9 7.5 7.3  HGB 8.9* 9.1* 8.5*  HCT 27.6* 28.2* 27.1*  PLT 462* 428* 408*   Coag's  Recent Labs Lab 08/04/13 0500 08/05/13 0409 08/06/13 0430  INR 1.98* 2.02* 1.98*    BMET  Recent Labs Lab 08/04/13 0500 08/05/13 0409 08/06/13 0430  NA 145 145 142  K 3.5* 3.6* 3.7  CL 106 106 106  CO2 25 25 26   BUN 28* 20 20  CREATININE 0.99 0.82 0.85  GLUCOSE 122* 141* 129*   Electrolytes  Recent Labs Lab 08/04/13 0500 08/05/13 0409 08/06/13 0430  CALCIUM 8.8 8.7 8.6   Glucose  Recent Labs Lab 08/05/13 1207 08/05/13 1608 08/05/13 1926 08/05/13 2324  08/06/13 0332 08/06/13 0757  GLUCAP 97 82 102* 114* 113* 131*    Imaging No results found.  ASSESSMENT / PLAN:  PULMONARY A: Acute respiratory failure 2nd to Rt MCA-CVA and difficulty with airway secretions.  Re-intubated 2/01. Bloody oral secretions ?from traumatic intubation 2/01 -no active bleed on ENT exam/directlaryngoscopy.  S/p tracheostomy >> off vent since 2/05. P:   -ATC as tolerated -change to #6 cuffless trach 2/13 -Speech therapy to assess for PM valve >> likely will do better once trach downsized. -Robinul - good response, can increase to tid prn if needed -f/u CXR intermittently  CARDIOVASCULAR A:  NSTEMI  (felt to be Pump thrombosis) Acute on Chronic systolic HF (EF 10-15%) w/ LVAD placed in march 2013 at [facility].  Cardiogenic shock >> resolved. P - LVAD per cards, co-ox improving -Asa, plavix per cards  -heparin resumed 2/4  RENAL A:   AKI in setting of hypoperfusion, s/p contrast load for cerebral arteriogram >> resolved. Hypokalemia. P:   -F/u chemistry  -replete K  GASTROINTESTINAL A:   Nutrition -high risk for PEG or J-tube. P:   -PPI for SUP -resumed tube feeds with panda -bowel regimen -speech therapy to assess swallowing >> may need to defer until trach can be downsized.  HEMATOLOGIC A:   Chronic anticoagulation (on coumadin for LVAD) >> Concern about acute pump related embolic events. Anemia of chronic disease. P:  -Heparin/coumadin per pharmacy  -f/u CBC  INFECTIOUS A:   ? aspiration pneumonia >> Sputum cx negative. Fever 2/09 >> no obvious source of infection, and no further fever. P:   -if he has recurrent fever, then consider sending cultures and adding Abx  ENDOCRINE A:   Mild hyperglycemia. P:   -SSI if glucose > 150.  NEUROLOGIC A:   Right MCA embolic CVA.  P:   -per neuro -aggressive PT/OT  SUMMARY: Improving.  Down size trach 2/13, and f/u with speech therapy for PM valve and swallow assessment.  PCCM will f/u on Monday 2/16 to assess when to downsize trach.  Call if help needed sooner.  Coralyn Helling, MD [facility] Pulmonary/Critical Care 08/06/2013, 10:55 AM Pager:  [phone] After 3pm call: [phone]

## 2013-08-06 NOTE — Progress Notes (Signed)
Physical Therapy Treatment Patient Details Name: Eric LovelyUndra [name] MRN: [id number] DOB: 12/18/1960 Today's Date: 08/06/2013 Time: [id number] PT Time Calculation (min): 44 min  PT Assessment / Plan / Recommendation  History of Present Illness [name] is 53 y/o male with a history of severe CHF due to NICM EF 10-15% s/p HM [facility]. His medical history also consists of CRI, NSVT s/p ICD and LV thrombus. Admitted 1/24 with NSTEMI felt secondary due embolic phenomenon due to pump thrombosis. 1/26 developed acute onset of aphasia and left-sided hemiparesis due to acute right MCA embolic CVA. Extubated 1/30. Reintubated 2/2 due to inability control secretions. Trach 2/4    PT Comments   Pt continuing to increase his exercise tolerance and requiring less physical assist with functional mobility--he even walked 8 steps today! Motivated and follows instructions, however at times appears to have motor planning issues (especially with RLE). Discharge plan updated to reflect patient's ability to tolerate more intense post-acute venue.    Follow Up Recommendations  CIR     Does the patient have the potential to tolerate intense rehabilitation     Barriers to Discharge        Equipment Recommendations       Recommendations for Other Services Rehab consult  Frequency Min 4X/week   Progress towards PT Goals Progress towards PT goals: Progressing toward goals  Plan Discharge plan needs to be updated;Frequency needs to be updated    Precautions / Restrictions Precautions Precautions: Fall Precaution Comments: LVAD Restrictions Weight Bearing Restrictions: No   Pertinent Vitals/Pain SaO2 on trach collar >95% throughout; occasional RR upper 30s and responded well to cues to slow his breathing down.    Mobility  Transfers Overall transfer level: Needs assistance Equipment used: 2 person hand held assist Transfers: Sit to/from Stand Sit to Stand: Min assist;+2  safety/equipment General transfer comment: Pt required faciliation for adequate hip flexion/forward excursion of trunk and min facilitation at Lt. hip for extension.  Ambulation/Gait Ambulation/Gait assistance: Mod assist;+2 physical assistance;+2 safety/equipment Ambulation Distance (Feet): 4 Feet Assistive device: 2 person hand held assist Gait Pattern/deviations: Step-to pattern;Decreased weight shift to right;Decreased weight shift to left;Ataxic;Trunk flexed;Wide base of support General Gait Details: Pt requires physical assist for Lt hip extension/upright torso, weight-shifting L and R to advance LEs. Pt able to advance LLE with tactile cues, however at times needs assist for proper placement (tends to abduct and ER). Pt requires incr assist to advance RLE due to ataxia (pt denies fear causing difficulty coordinating advanceing RLE; feels his RLE is not doing what he wants it to do) Modified Rankin (Stroke Patients Only) Pre-Morbid Rankin Score: No symptoms Modified Rankin: Severe disability    Exercises     PT Diagnosis:    PT Problem List:   PT Treatment Interventions:     PT Goals (current goals can now be found in the care plan section)    Visit Information  Last PT Received On: 08/06/13 Assistance Needed: +2 Reason for Co-Treatment: Complexity of the patient's impairments (multi-system involvement);For patient/therapist safety OT goals addressed during session: Strengthening/ROM;Other (comment) (functional transfers) History of Present Illness: [name] is 53 y/o male with a history of severe CHF due to NICM EF 10-15% s/p HM II [facility]. His medical history also consists of CRI, NSVT s/p ICD and LV thrombus. Admitted 1/24 with NSTEMI felt secondary due embolic phenomenon due to pump thrombosis. 1/26 developed acute onset of aphasia and left-sided hemiparesis due to acute  right MCA embolic CVA. Extubated 1/30. Reintubated 2/2 due to inability control secretions.  Trach 2/4     Subjective Data      Cognition  Cognition Arousal/Alertness: Awake/alert Behavior During Therapy: Flat affect Overall Cognitive Status: Within Functional Limits for tasks assessed Difficult to assess due to: Tracheostomy    Balance  Balance Overall balance assessment: Needs assistance Sitting-balance support: Feet supported Sitting balance-Leahy Scale: Good Standing balance support: Single extremity supported Standing balance-Leahy Scale: Poor Standing balance comment: Pt stood 5 minutes with min A +2 while reaching with Rt. UE (and Lt UE in weight-bearing on table in front of him) to facilatate trunk extension and weight shifts. Pt required faciliation at Lt hip for extension and Lt. shoulder/scap for trunk extension. Lt. knee blocked, but pt maintained control throughout with one episode of hyperextension   End of Session PT - End of Session Equipment Utilized During Treatment: Oxygen Activity Tolerance: Patient tolerated treatment well;Patient limited by fatigue Patient left: in chair;with call bell/phone within reach;with nursing/sitter in room Nurse Communication: Mobility status   GP     [name] 08/06/2013, 1:27 PM Pager [phone]

## 2013-08-06 NOTE — Progress Notes (Signed)
Occupational Therapy Treatment Patient Details Name: [name] MRN: [id number] DOB: 08/08/1960 Today's Date: 08/06/2013 Time: [id number] OT Time Calculation (min): 44 min  OT Assessment / Plan / Recommendation  History of present illness [name] is 53 y/o male with a history of severe CHF due to NICM EF 10-15% s/p HM II [facility]. His medical history also consists of CRI, NSVT s/p ICD and LV thrombus. Admitted 1/24 with NSTEMI felt secondary due embolic phenomenon due to pump thrombosis. 1/26 developed acute onset of aphasia and left-sided hemiparesis due to acute right MCA embolic CVA. Extubated 1/30. Reintubated 2/2 due to inability control secretions. Trach 2/4    OT comments  Pt continues to demonstrate improved activity tolerance, improved trunk control.  Co-treat with PT - pt ambulated 4' with mod A +2 and worked on standing with min A +2 while reaching with Rt. UE and using Lt UE as a support. Pt is very motivated and making excellent progress.  Continue to feel CIR is best venue to meet pt needs.   Follow Up Recommendations  CIR;Supervision/Assistance - 24 hour    Barriers to Discharge       Equipment Recommendations  3 in 1 bedside comode    Recommendations for Other Services Rehab consult  Frequency Min 3X/week   Progress towards OT Goals Progress towards OT goals: Progressing toward goals  Plan Discharge plan remains appropriate    Precautions / Restrictions Precautions Precautions: Fall Precaution Comments: LVAD Restrictions Weight Bearing Restrictions: No   Pertinent Vitals/Pain     ADL  Eating/Feeding: NPO Toilet Transfer: Moderate assistance Toilet Transfer Method: Sit to stand;Stand pivot Toilet Transfer Equipment: Bedside commode Toileting - Clothing Manipulation and Hygiene: Maximal assistance Where Assessed - Toileting Clothing Manipulation and Hygiene: Standing Transfers/Ambulation Related to ADLs: Mod A +2 for short distance  ambulation ADL Comments: Co -treat with PT.  Worked on Standing with Lt. UE in WBing. Required min A +2 for standing.  Worked on reaching with Rt UE to facilitate trunk extension and weight shift.    Then worked on ambulation with mod A  +2.  Faciliatation at Lt pelvis/hip and Lt shoulder/scap and Lt. knee as well as assist Rt. LE to advance Rt leg due to apraxia     OT Diagnosis:    OT Problem List:   OT Treatment Interventions:     OT Goals(current goals can now be found in the care plan section) ADL Goals Pt Will Perform Grooming: with supervision;sitting Pt Will Perform Upper Body Bathing: with min assist;sitting Pt Will Perform Lower Body Bathing: sit to/from stand;with mod assist Pt Will Perform Upper Body Dressing: sitting;with mod assist Pt Will Perform Lower Body Dressing: with max assist;sit to/from stand Pt Will Transfer to Toilet: with min assist;stand pivot transfer;bedside commode Pt Will Perform Toileting - Clothing Manipulation and hygiene: with mod assist;sit to/from stand Additional ADL Goal #1: Pt will use Lt. UE as a stabilizer during BADLs Additional ADL Goal #2: Pt will be able to manage LVAD equipment with mod A  Visit Information  Last OT Received On: 08/06/13 Assistance Needed: +2 (for ambulation and for lines if transferring) PT/OT/SLP Co-Evaluation/Treatment: Yes Reason for Co-Treatment: Complexity of the patient's impairments (multi-system involvement) OT goals addressed during session: Strengthening/ROM;Other (comment) (functional transfers) History of Present Illness: [name] is 53 y/o male with a history of severe CHF due to NICM EF 10-15% s/p HM [facility]. His medical history also consists of CRI,  NSVT s/p ICD and LV thrombus. Admitted 1/24 with NSTEMI felt secondary due embolic phenomenon due to pump thrombosis. 1/26 developed acute onset of aphasia and left-sided hemiparesis due to acute right MCA embolic CVA. Extubated 1/30.  Reintubated 2/2 due to inability control secretions. Trach 2/4     Subjective Data      Prior Functioning       Cognition  Cognition Arousal/Alertness: Awake/alert Behavior During Therapy: Flat affect Overall Cognitive Status: Within Functional Limits for tasks assessed (no overt cognitive deficits noted) Difficult to assess due to: Tracheostomy    Mobility  Transfers Overall transfer level: Needs assistance Equipment used: 2 person hand held assist Transfers: Sit to/from Stand Sit to Stand: +2 safety/equipment;Min assist General transfer comment: Pt required faciliation for adequate hip flexion/forward excursion of trunk and min facilitation at Lt. hip for extension.  See PT notes for details of ambulation    Exercises      Balance Balance Overall balance assessment: Needs assistance Sitting-balance support: Feet supported Sitting balance-Leahy Scale: Good Standing balance support: Single extremity supported;Bilateral upper extremity supported Standing balance-Leahy Scale: Fair Standing balance comment: Pt stood with min A +2 while reaching with Lt. UE to facilatate trunk extension and weight shifts.  Pt required faciliation at Lt hip for extension and Lt. shoulder/scap for trunk extension.  Lt. knee blocked, but pt maintained control throughout with one episode of hyperextension   End of Session OT - End of Session Equipment Utilized During Treatment: Oxygen Activity Tolerance: Patient tolerated treatment well Patient left: in chair;with call bell/phone within reach Nurse Communication: Mobility status  GO     Jeani Hawking M 08/06/2013, 11:52 AM

## 2013-08-06 NOTE — Progress Notes (Addendum)
Patient ID: [name], male   DOB: [birth year], 53 y.o.   MRN: [id number] Advanced Heart Failure Rounding Note  [name] is 53 y/o male with a history of severe CHF due to NICM EF 10-15%, and severe MR s/p HM II LVAD placed at [facility] March 2013. His medical history also consists of CRI, NSVT s/p ICD and LV thrombus.   Admitted 1/24 with NSTEMI felt secondary due embolic phenomenon due to pump thrombosis.  LDH high around 900.  On way to cath lab 1/26 developed acute onset of aphasia and left-sided hemiparesis. CT negative for bleed. Taken emergently to neuro-interventional suite but no amenable lesions. Remains intubated on levophed. Remains on propofol gtt, unable to assess neuro status.  S/P TEE 1/28. No evidence of clot but with RV failure.   Extubated 1/30. Reintubated 2/2 due to inability control secretions. Trach placed due to difficulty with secretions and on trach collar this am.  Off norepinephrine. Map 80-90s .  Sitting in chair. Mod assist x1 to stand and transfer to chair.  Denies dyspnea. Moving L leg more.    Hgb 11.6> 9.8>9.0> 8.5>9.7>9.3>8.9>8.7>8.9>9.1> 8.5   LDH:  982>966 Co-ox: 65%  INR 1.9 CVP 13  VAD interrogated personally.  Flow 5.4, Speed 9200 PI 6.6,  Power 5.5   Events: 1l PI events  Filed Vitals:   08/06/13 0400 08/06/13 0700 08/06/13 0722 08/06/13 0800  BP:      Pulse: 105 105  103  Temp:   98.4 F (36.9 C)   TempSrc:   Oral   Resp: 18 19  21   Height:      Weight:      SpO2: 99% 98%  98%    Intake/Output Summary (Last 24 hours) at 08/06/13 0825 Last data filed at 08/06/13 0800  Gross per 24 hour  Intake   1819 ml  Output    320 ml  Net   1499 ml   Scheduled Meds: . allopurinol  100 mg Oral Daily  . antiseptic oral rinse  15 mL Mouth Rinse QID  . aspirin  81 mg Oral Daily  . chlorhexidine  15 mL Mouth Rinse BID  . clopidogrel  75 mg Oral Q breakfast  . etomidate  40 mg Intravenous Once  . feeding supplement (PRO-STAT SUGAR FREE 64)  30 mL Per  Tube QID  . glycopyrrolate  1 mg Per Tube BID  . insulin aspart  0-15 Units Subcutaneous 6 times per day  . magnesium hydroxide  30 mL Per Tube NOW  . magnesium oxide  400 mg Oral Daily  . pantoprazole sodium  40 mg Per Tube Q1200  . sodium chloride  10-40 mL Intracatheter Q12H  . spironolactone  25 mg Oral Daily  . Warfarin - Pharmacist Dosing Inpatient   Does not apply q1800   Continuous Infusions: . sodium chloride 20 mL/hr at 08/06/13 0600  . feeding supplement (OSMOLITE 1.5 CAL) 1,000 mL (08/06/13 0600)  . heparin 1,300 Units/hr (08/06/13 0800)  . nitroGLYCERIN Stopped (07/17/13 1800)   PRN Meds:.acetaminophen, fentaNYL, hydrALAZINE, loperamide, nitroGLYCERIN, ondansetron (ZOFRAN) IV  LABS: Basic Metabolic Panel:  Recent Labs  31/51/76 0409 08/06/13 0430  NA 145 142  K 3.6* 3.7  CL 106 106  CO2 25 26  GLUCOSE 141* 129*  BUN 20 20  CREATININE 0.82 0.85  CALCIUM 8.7 8.6   Liver Function Tests: No results found for this basename: AST, ALT, ALKPHOS, BILITOT, PROT, ALBUMIN,  in the last 72 hours No results found for  this basename: LIPASE, AMYLASE,  in the last 72 hours CBC:  Recent Labs  08/05/13 0409 08/06/13 0430  WBC 7.5 7.3  HGB 9.1* 8.5*  HCT 28.2* 27.1*  MCV 92.5 92.8  PLT 428* 408*   Cardiac Enzymes: No results found for this basename: CKTOTAL, CKMB, CKMBINDEX, TROPONINI,  in the last 72 hours RADIOLOGY: Dg Chest Portable 1 View  07/17/2013   CLINICAL DATA:  Left-sided chest pain for 3 days, weakness and dizziness.  EXAM: PORTABLE CHEST - 1 VIEW  COMPARISON:  08/03/2012  FINDINGS: Evidence of median sternotomy noted with LVAD in place. Left-sided defibrillator noted. Mild prominence of the cardiac silhouette persists. Tricuspid valvuloplasty reidentified. Trace left pleural fluid or thickening persists. No new pulmonary opacity.  IMPRESSION: No new acute abnormality. Stable appearance of trace left pleural fluid or thickening.   Electronically Signed   By:  Christiana PellantGretchen  Green M.D.   On: 07/17/2013 10:12    PHYSICAL EXAM- CVP ~13 General: NAD; on trach collar; Sitting in chair.  Neck: JVP 1+; trach Lungs: coarse throughout CV: LVAD pump hum normal.   Abdomen: Soft, nontender, no hepatosplenomegaly, mild distention. Hypoactive BS Neurologic: awake interactive. Expressive aphasia. Moving left leg, slight movement left hand.   Extremities: No clubbing or cyanosis.  1+ edema RUE PICC  TELEMETRY: SR 80-90s    Assessment:   1. Acute embolic CVA 2. NSTEMI - Likely pump thrombosis with coronary embolus.  2. Chronic systolic HF s/p HM II VAD  3. NICM EF 15%  4. Hyperkalemia  5. HTN  6. Acute respiratory failure 7. Shock - suspect mixed picture RV failure/vasodilatory 8. Oral bleeding  Plan/Discussion:    He has had NSTEMI and acute CVA due to pump thrombosis/embolism. Now with expressive aphasia L hemiplegia. Making slow progress. Initially refused for CIR but I talked to Dr. Hermelinda MedicusSchwartz today to discuss his progress and they will stay on board and continue to re-evaluate his candidacy as he improves. Given his complexity I would favor having him at CIR so we can follow closely if at all possible. Continue PT.   INR therapeutic. LDH down a little. UA with bili - concerning for persistent pump thrombosis. Will continue heparin, coumadin, plavix, and asa for now. Overlap heparin/coumadin at least another 24 hours. .   Continues on trach collar this am.  Secretions better with Robinul and no epistaxis now. Deemed too high risk currently for PEG or J-tube.  To keep panda tube for now.   Hemodynamically stable of inotropes. Through RV still very tenuous. Needs CVP 12-15 due to RHF.Add lasix 40 mg po bid. Continue spironolactone 25 mg daily . Renal function stable.    Tonye BecketAmy Clegg, NP  Patient seen and examined with Tonye BecketAmy Clegg, NP. We discussed all aspects of the encounter. I agree with the assessment and plan as stated above.   Continues to improve.  Hemodynamically stable. Improving from neuro standpoint. Trach to be downsized today and hopefully can try Passey-Muir valve.  I remained concerned about persistent pump thrombosis. Continue heparin/couamdin overlap. Will d/w [facility]. He will be re-evaluated by CIR over the next few days. Appreciate their help. Would hold off on lasix for now. Keep CVP 12-15 due to RHF.  I reviewed the LVAD parameters from today, and compared the results to the patient's prior recorded data. No programming changes were made. The LVAD is functioning within specified parameters. The patient performs LVAD self-test daily. LVAD interrogation was negative for any significant power changes, alarms or PI events/speed  drops. LVAD equipment check completed and is in good working order. Back-up equipment present. LVAD education done on emergency procedures and precautions and reviewed exit site care.    Chezney Huether,MD 10:21 AM

## 2013-08-06 NOTE — Progress Notes (Signed)
Rec'd order to change trach. Upon removing trach suture, pt was oozing small amount of blood from suture area and also noted packing around trach stoma (under trach). Pt still on blood thinners, RN called Dr. Darreld Mclean. Per MD, hold off on trach change for today d/t possibility of bleeding from trach stoma w/ trach change. Per MD, CCM to address trach change in morning rounds on pt.   CO-SIGNED by Herbert Deaner

## 2013-08-06 NOTE — Progress Notes (Signed)
NUTRITION FOLLOW UP  Intervention:    Continue current TF regimen RD to follow for nutrition care plan  Nutrition Dx:   Inadequate oral intake related to inability to eat as evidenced by NPO status, ongoing  New Goal:   Pt to meet >/= 90% of their estimated nutrition needs, met  Monitor:   TF regimen & tolerance, respiratory status, weight, labs, I/O's  Assessment:   Patient with PMH of severe CHF, mitral regurgitation and HTN; s/p LVAD placement at [facility] March 2013, as bridge to transplant; admitted for evaluation of new NSTEMI possibly related to pump thrombosis.   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   Patient s/p procedure 2/4: PERCUTANEOUS TRACHEOSTOMY   Patient currently on trach collar -- off ventilatory support.    PEG tube planned 2/6 to avoid LVAD wires that are across patient's abdomen, however, IR unable to place given high risk.  J-tube placement considered, however, too high risk with recent MI and stroke.    Plan is to continue TF via small-bore feeding tube (placed 2/6, tip near ligament of Treitz).  Disposition: LTACH vs IP Rehab.  Osmolite 1.5 formula currently infusing at 40 ml/hr with Prostat liquid protein 30 ml QID via Panda tube providing 1840 total kcals, 120 gm protein, 732 ml of free water.  Tolerating well.  Speech Path following for Passy-Muir Speaking Valve.  Height: Ht Readings from Last 1 Encounters:  07/17/13 $RemoveB'5\' 4"'eKxsYfqY$  (1.626 m)    Weight Status ---> stable Wt Readings from Last 1 Encounters:  08/06/13 189 lb 9.5 oz (86 kg)    2/12  191 lb 2/11  189 lb 2/10  187 lb 2/09  188 lb 2/08  187 lb 2/07  193 lb 2/06     192 lb 2/05  196 lb  Re-estimated needs:  Kcal: 1700-1900 Protein: 120-130 gm Fluid: per MD  Skin: groin puncture site  Diet Order: NPO   Intake/Output Summary (Last 24 hours) at 08/06/13 1454 Last data filed at 08/06/13 1400  Gross per 24 hour  Intake  1878.07 ml  Output    570 ml  Net 1308.07 ml    Labs:   Recent Labs Lab 08/04/13 0500 08/05/13 0409 08/06/13 0430  NA 145 145 142  K 3.5* 3.6* 3.7  CL 106 106 106  CO2 $Re'25 25 26  'PGO$ BUN 28* 20 20  CREATININE 0.99 0.82 0.85  CALCIUM 8.8 8.7 8.6  GLUCOSE 122* 141* 129*    CBG (last 3)   Recent Labs  08/06/13 0332 08/06/13 0757 08/06/13 1207  GLUCAP 113* 131* 103*    Scheduled Meds: . allopurinol  100 mg Oral Daily  . antiseptic oral rinse  15 mL Mouth Rinse QID  . aspirin  81 mg Oral Daily  . chlorhexidine  15 mL Mouth Rinse BID  . clopidogrel  75 mg Oral Q breakfast  . etomidate  40 mg Intravenous Once  . feeding supplement (PRO-STAT SUGAR FREE 64)  30 mL Per Tube QID  . glycopyrrolate  1 mg Per Tube BID  . insulin aspart  0-15 Units Subcutaneous 6 times per day  . magnesium hydroxide  30 mL Per Tube NOW  . magnesium oxide  400 mg Oral Daily  . pantoprazole sodium  40 mg Per Tube Q1200  . sodium chloride  10-40 mL Intracatheter Q12H  . spironolactone  25 mg Oral Daily  . warfarin  10 mg Oral ONCE-1800  . Warfarin -  Pharmacist Dosing Inpatient   Does not apply q1800    Continuous Infusions: . sodium chloride 20 mL/hr at 08/06/13 0600  . feeding supplement (OSMOLITE 1.5 CAL) 1,000 mL (08/06/13 0600)  . heparin 1,200 Units/hr (08/06/13 1400)  . nitroGLYCERIN Stopped (07/17/13 1800)    Arthur Holms, RD, LDN Pager #: [phone] After-Hours Pager #: [phone]

## 2013-08-06 NOTE — Progress Notes (Addendum)
ANTICOAGULATION CONSULT NOTE - Follow Up Consult  Pharmacy Consult for Heparin,Coumadin Indication: LVAD thrombosis/CVA   Allergies  Allergen Reactions  . Ace Inhibitors Cough  . Lexapro [Escitalopram Oxalate] Other (See Comments)    somnolence    Patient Measurements: Height: 5\' 4"  (162.6 cm) Weight: 189 lb 9.5 oz (86 kg) IBW/kg (Calculated) : 59.2 Heparin Dosing Weight: 77kg  Vital Signs: Temp: 98.7 F (37.1 C) (02/13 0336) Temp src: Oral (02/13 0336) Pulse Rate: 105 (02/13 0400)  Labs:  Recent Labs  08/04/13 0500 08/05/13 0409 08/06/13 0430  HGB 8.9* 9.1* 8.5*  HCT 27.6* 28.2* 27.1*  PLT 462* 428* 408*  LABPROT 21.9* 22.2* 21.9*  INR 1.98* 2.02* 1.98*  HEPARINUNFRC 0.47 0.42 0.28*  CREATININE 0.99 0.82 0.85    Estimated Creatinine Clearance: 102.8 ml/min (by C-G formula based on Cr of 0.85).  . sodium chloride 20 mL/hr at 08/06/13 0600  . feeding supplement (OSMOLITE 1.5 CAL) 1,000 mL (08/06/13 0600)  . heparin 1,200 Units/hr (08/06/13 0600)  . nitroGLYCERIN Stopped (07/17/13 1800)    Assessment: 52yom with LVAD, suspected pump thrombosis and acute CVA continues on heparin bridge to warfarin. Heparin level low this am after restart yesterday afternoon. No IV issues or bleeding noted. Urine still discolored. INR also down slightly to 1.9. Will give extra warfarin tonight and continue heparin bridge for now until more in therapeutic range with INR.  Patient now off amio (1/27-2/6)  Goal of Therapy:  Heparin level 0.3-0.5 units/ml Monitor platelets by anticoagulation protocol: Yes INR 2.0-3.0   Plan:  Increase heparin to 1300/hr - recheck HL this afternoon Will continue to monitor bleeding Coumadin 10 mg x 1 tonight Daily CBC, and INR   Sheppard Coil PharmD., BCPS Clinical Pharmacist Pager [phone] 08/06/2013 7:06 AM  Heparin level now 0.58 on 1300 units/hr - will reduce rate back down to 1200 units/hr. He has previously been very stable and  therapeutic on this dose. Continue daily HL/CBC.   08/06/2013 1:30 PM

## 2013-08-06 NOTE — Progress Notes (Signed)
Rec'd order to change trach.  Upon removing trach suture, pt was oozing small amount of blood from suture area and also noted packing around trach stoma (under trach).  Pt still on blood thinners, RN called Dr. Darreld Mclean.  Per MD, hold off on trach change for today d/t possibility of bleeding from trach stoma w/ trach change.  Per MD, CCM to address trach change in morning rounds on pt.

## 2013-08-07 LAB — CBC
HCT: 27.2 % — ABNORMAL LOW (ref 39.0–52.0)
Hemoglobin: 8.5 g/dL — ABNORMAL LOW (ref 13.0–17.0)
MCH: 28.9 pg (ref 26.0–34.0)
MCHC: 31.3 g/dL (ref 30.0–36.0)
MCV: 92.5 fL (ref 78.0–100.0)
PLATELETS: 378 10*3/uL (ref 150–400)
RBC: 2.94 MIL/uL — AB (ref 4.22–5.81)
RDW: 16.1 % — ABNORMAL HIGH (ref 11.5–15.5)
WBC: 7.4 10*3/uL (ref 4.0–10.5)

## 2013-08-07 LAB — BASIC METABOLIC PANEL
BUN: 17 mg/dL (ref 6–23)
CO2: 26 mEq/L (ref 19–32)
CREATININE: 0.82 mg/dL (ref 0.50–1.35)
Calcium: 8.7 mg/dL (ref 8.4–10.5)
Chloride: 107 mEq/L (ref 96–112)
GFR calc Af Amer: >90 mL/min (ref >90)
GFR calc non Af Amer: >90 mL/min (ref >90)
Glucose, Bld: 121 mg/dL — ABNORMAL HIGH (ref 70–99)
Potassium: 3.9 mEq/L (ref 3.7–5.3)
Sodium: 144 mEq/L (ref 137–147)

## 2013-08-07 LAB — GLUCOSE, CAPILLARY
GLUCOSE-CAPILLARY: 99 mg/dL (ref 70–99)
Glucose-Capillary: 102 mg/dL — ABNORMAL HIGH (ref 70–99)
Glucose-Capillary: 102 mg/dL — ABNORMAL HIGH (ref 70–99)
Glucose-Capillary: 107 mg/dL — ABNORMAL HIGH (ref 70–99)
Glucose-Capillary: 109 mg/dL — ABNORMAL HIGH (ref 70–99)
Glucose-Capillary: 116 mg/dL — ABNORMAL HIGH (ref 70–99)

## 2013-08-07 LAB — CARBOXYHEMOGLOBIN
Carboxyhemoglobin: 1.7 % — ABNORMAL HIGH (ref 0.5–1.5)
Methemoglobin: 0.6 % (ref 0.0–1.5)
O2 Saturation: 57.8 %
Total hemoglobin: 13.4 g/dL — ABNORMAL LOW (ref 13.5–18.0)

## 2013-08-07 LAB — PROTIME-INR
INR: 1.91 — ABNORMAL HIGH (ref 0.00–1.49)
Prothrombin Time: 21.3 seconds — ABNORMAL HIGH (ref 11.6–15.2)

## 2013-08-07 LAB — LACTATE DEHYDROGENASE: LDH: 912 U/L — ABNORMAL HIGH (ref 94–250)

## 2013-08-07 LAB — HEPARIN LEVEL (UNFRACTIONATED): Heparin Unfractionated: 0.52 IU/mL (ref 0.30–0.70)

## 2013-08-07 MED ORDER — WARFARIN SODIUM 10 MG PO TABS
10.0000 mg | ORAL_TABLET | Freq: Once | ORAL | Status: AC
  Administered 2013-08-07: 10 mg via ORAL
  Filled 2013-08-07: qty 1

## 2013-08-07 MED ORDER — FUROSEMIDE 40 MG PO TABS
40.0000 mg | ORAL_TABLET | Freq: Every day | ORAL | Status: DC
  Administered 2013-08-07 – 2013-08-11 (×5): 40 mg via ORAL
  Filled 2013-08-07 (×6): qty 1

## 2013-08-07 NOTE — Progress Notes (Signed)
ANTICOAGULATION CONSULT NOTE - Follow Up Consult  Pharmacy Consult for heparin, coumadin Indication: LVAD thrombosis/CVA  Allergies  Allergen Reactions  . Ace Inhibitors Cough  . Lexapro [Escitalopram Oxalate] Other (See Comments)    somnolence    Patient Measurements: Height: 5\' 4"  (162.6 cm) Weight: 189 lb 9.5 oz (86 kg) IBW/kg (Calculated) : 59.2 Heparin Dosing Weight:   Vital Signs: Temp: 98.3 F (36.8 C) (02/14 0815) Temp src: Oral (02/14 0815) BP: 106/90 mmHg (02/14 0827) Pulse Rate: 106 (02/14 0827)  Labs:  Recent Labs  08/05/13 0409 08/06/13 0430 08/06/13 1200 08/07/13 0340  HGB 9.1* 8.5*  --  8.5*  HCT 28.2* 27.1*  --  27.2*  PLT 428* 408*  --  378  LABPROT 22.2* 21.9*  --  21.3*  INR 2.02* 1.98*  --  1.91*  HEPARINUNFRC 0.42 0.28* 0.58 0.52  CREATININE 0.82 0.85  --  0.82    Estimated Creatinine Clearance: 106.6 ml/min (by C-G formula based on Cr of 0.82).   Medications:  Scheduled:  . allopurinol  100 mg Oral Daily  . antiseptic oral rinse  15 mL Mouth Rinse QID  . aspirin  81 mg Oral Daily  . chlorhexidine  15 mL Mouth Rinse BID  . clopidogrel  75 mg Oral Q breakfast  . etomidate  40 mg Intravenous Once  . feeding supplement (PRO-STAT SUGAR FREE 64)  30 mL Per Tube QID  . furosemide  40 mg Oral Daily  . glycopyrrolate  1 mg Per Tube BID  . insulin aspart  0-15 Units Subcutaneous 6 times per day  . magnesium oxide  400 mg Oral Daily  . pantoprazole sodium  40 mg Per Tube Q1200  . sodium chloride  10-40 mL Intracatheter Q12H  . spironolactone  25 mg Oral Daily  . warfarin  10 mg Oral ONCE-1800  . Warfarin - Pharmacist Dosing Inpatient   Does not apply q1800    Assessment: Heparin level is 0.52 today. No adjustments needed. INR is 1.91, slightly subtherapeutic. Will repeat 10 mg dose of coumadin. Pt having problems with urinary retention.  Goal of Therapy:  INR 2-3 Monitor platelets by anticoagulation protocol: Yes   Plan:  No change  to the heparin dose. Coumadin 10 mg X 1 today. F/U heparin level, CBC, INR in AM  Eugene Garnet 08/07/2013,11:05 AM

## 2013-08-07 NOTE — Progress Notes (Signed)
No urine output noted after replacing condom cath twice, during which moderate sized clots were noted in bed; inquiry to Pt they agreed their bladder felt full. Bladder scan done with > 300 cc x 2. Dr Donata Clay called and reviewed Pt's labs, clinical status and above information. Orders received to hold heparin drip x 1 hour and to replace foley cath with 1 irrigation of 60 cc NS for lack of output. After replacing foley, a return of pinkish to yellow urine was noted to the amount of 250 cc, leg strap applied. Will cont' to monitor and assess.

## 2013-08-07 NOTE — Progress Notes (Signed)
Patient ID: [name], male   DOB: 06/28/1960, 53 y.o.   MRN: [id number] Advanced Heart Failure Rounding Note  [name] is 53 y/o male with a history of severe CHF due to NICM EF 10-15%, and severe MR s/p HM II LVAD [facility]. His medical history also consists of CRI, NSVT s/p ICD and LV thrombus.   Admitted 1/24 with NSTEMI felt secondary due embolic phenomenon due to pump thrombosis.  LDH high around 900.  On way to cath lab 1/26 developed acute onset of aphasia and left-sided hemiparesis. CT negative for bleed. Taken emergently to neuro-interventional suite but no amenable lesions. Remains intubated on levophed. Remains on propofol gtt, unable to assess neuro status.  S/P TEE 1/28. No evidence of clot but with RV failure.   Extubated 1/30. Reintubated 2/2 due to inability control secretions. Trach placed due to difficulty with secretions and on trach collar this am.  Off norepinephrine. Map 80-90s .  Trach not downsized yesterday. Did well. Able to take a few steps with PT yesterday. Developed urinary retention with clots. Foley placed. 300cc obtained. Stable hemodynamically. CVPs 14-16.    Hgb 11.6>>>> 8.5   LDH:  [id number]>966>912 Co-ox: 65% , 58% INR 1.9 CVP 14-16  VAD interrogated personally.  Flow 5.2, Speed 9200 PI 6.6,  Power 5.7  Events: occasional PI events  Filed Vitals:   08/07/13 0428 08/07/13 0500 08/07/13 0600 08/07/13 0700  BP: 98/62     Pulse: 103 107 110 100  Temp:      TempSrc:      Resp: 23 26 34 16  Height:      Weight:      SpO2: 97% 97% 99% 98%    Intake/Output Summary (Last 24 hours) at 08/07/13 0748 Last data filed at 08/07/13 0700  Gross per 24 hour  Intake 2068.07 ml  Output    495 ml  Net 1573.07 ml   Scheduled Meds: . allopurinol  100 mg Oral Daily  . antiseptic oral rinse  15 mL Mouth Rinse QID  . aspirin  81 mg Oral Daily  . chlorhexidine  15 mL Mouth Rinse BID  . clopidogrel  75 mg Oral Q breakfast  . etomidate  40 mg  Intravenous Once  . feeding supplement (PRO-STAT SUGAR FREE 64)  30 mL Per Tube QID  . glycopyrrolate  1 mg Per Tube BID  . insulin aspart  0-15 Units Subcutaneous 6 times per day  . magnesium oxide  400 mg Oral Daily  . pantoprazole sodium  40 mg Per Tube Q1200  . sodium chloride  10-40 mL Intracatheter Q12H  . spironolactone  25 mg Oral Daily  . Warfarin - Pharmacist Dosing Inpatient   Does not apply q1800   Continuous Infusions: . sodium chloride 20 mL/hr at 08/06/13 0600  . feeding supplement (OSMOLITE 1.5 CAL) 1,000 mL (08/06/13 0600)  . heparin Stopped (08/07/13 0700)  . nitroGLYCERIN Stopped (07/17/13 1800)   PRN Meds:.acetaminophen, fentaNYL, hydrALAZINE, loperamide, nitroGLYCERIN, ondansetron (ZOFRAN) IV  LABS: Basic Metabolic Panel:  Recent Labs  72/53/6602/13/15 0430 08/07/13 0340  NA 142 144  K 3.7 3.9  CL 106 107  CO2 26 26  GLUCOSE 129* 121*  BUN 20 17  CREATININE 0.85 0.82  CALCIUM 8.6 8.7   Liver Function Tests: No results found for this basename: AST, ALT, ALKPHOS, BILITOT, PROT, ALBUMIN,  in the last 72 hours No results found for this basename: LIPASE, AMYLASE,  in the last 72 hours CBC:  Recent Labs  08/06/13 0430 08/07/13 0340  WBC 7.3 7.4  HGB 8.5* 8.5*  HCT 27.1* 27.2*  MCV 92.8 92.5  PLT 408* 378   Cardiac Enzymes: No results found for this basename: CKTOTAL, CKMB, CKMBINDEX, TROPONINI,  in the last 72 hours RADIOLOGY: Dg Chest Portable 1 View  07/17/2013   CLINICAL DATA:  Left-sided chest pain for 3 days, weakness and dizziness.  EXAM: PORTABLE CHEST - 1 VIEW  COMPARISON:  08/03/2012  FINDINGS: Evidence of median sternotomy noted with LVAD in place. Left-sided defibrillator noted. Mild prominence of the cardiac silhouette persists. Tricuspid valvuloplasty reidentified. Trace left pleural fluid or thickening persists. No new pulmonary opacity.  IMPRESSION: No new acute abnormality. Stable appearance of trace left pleural fluid or thickening.    Electronically Signed   By: Christiana Pellant M.D.   On: 07/17/2013 10:12    PHYSICAL EXAM- CVP ~13 General: NAD; on trach collar; Sitting in chair.  Neck: JVP 1+; trach Lungs: coarse throughout CV: LVAD pump hum normal.   Abdomen: Soft, nontender, no hepatosplenomegaly, mild distention. Hypoactive BS Neurologic: awake interactive. Expressive aphasia. Moving left leg, slight movement left hand.   Extremities: No clubbing or cyanosis.  1+ edema RUE PICC  TELEMETRY: SR 90-100s    Assessment:   1. Acute embolic CVA 2. 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  3. 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 NICM EF 15%  5. Anemia 6. Shock - mixed picture RV failure/vasodilatory 7. Respiratory failure s/p trach 8. Urinary retention - Foley placed 2/14   Plan/Discussion:    He has had NSTEMI and acute CVA due to pump thrombosis/embolism. Now with expressive aphasia L hemiplegia. Making progress. Initially refused for CIR but will  continue to re-evaluate his candidacy as he improves. Given his complexity I would favor having him at CIR so we can follow closely if at all possible. Continue PT.   INR mildly subtherapeutic. LDH down a little. UA with bili - concerning for persistent pump thrombosis. Will continue heparin, coumadin, plavix, and asa for now. Overlap heparin/coumadin at least another 24 hours. .   Continues on trach collar this am. Janina Mayo to be downsized today. Deemed too high risk currently for PEG or J-tube.  To keep panda tube for now.   Hemodynamically stable off inotropes. Through RV still very tenuous. Needs CVP 12-15 due to RHF.Add lasix 40 mg po daily. Continue spironolactone 25 mg daily . Renal function stable.   Urinary retention. Continue Foley. Recheck UA. Sputum culture pending.   I reviewed the LVAD parameters from today, and compared the results to the patient's prior recorded data. No programming changes were made. The LVAD is functioning  within specified parameters. The patient performs LVAD self-test daily. LVAD interrogation was negative for any significant power changes, alarms or PI events/speed drops. LVAD equipment check completed and is in good working order. Back-up equipment present. LVAD education done on emergency procedures and precautions and reviewed exit site care.  Vora Clover,MD 7:48 AM

## 2013-08-08 LAB — HEPARIN LEVEL (UNFRACTIONATED): Heparin Unfractionated: 0.3 IU/mL (ref 0.30–0.70)

## 2013-08-08 LAB — CBC
HCT: 26.3 % — ABNORMAL LOW (ref 39.0–52.0)
Hemoglobin: 8.3 g/dL — ABNORMAL LOW (ref 13.0–17.0)
MCH: 28.9 pg (ref 26.0–34.0)
MCHC: 31.6 g/dL (ref 30.0–36.0)
MCV: 91.6 fL (ref 78.0–100.0)
PLATELETS: 383 10*3/uL (ref 150–400)
RBC: 2.87 MIL/uL — AB (ref 4.22–5.81)
RDW: 15.8 % — ABNORMAL HIGH (ref 11.5–15.5)
WBC: 6.6 10*3/uL (ref 4.0–10.5)

## 2013-08-08 LAB — CARBOXYHEMOGLOBIN
CARBOXYHEMOGLOBIN: 2.1 % — AB (ref 0.5–1.5)
Methemoglobin: 1.5 % (ref 0.0–1.5)
O2 SAT: 58.7 %
TOTAL HEMOGLOBIN: 8.7 g/dL — AB (ref 13.5–18.0)

## 2013-08-08 LAB — GLUCOSE, CAPILLARY
GLUCOSE-CAPILLARY: 119 mg/dL — AB (ref 70–99)
Glucose-Capillary: 118 mg/dL — ABNORMAL HIGH (ref 70–99)
Glucose-Capillary: 95 mg/dL (ref 70–99)
Glucose-Capillary: 102 mg/dL — ABNORMAL HIGH (ref 70–99)

## 2013-08-08 LAB — BASIC METABOLIC PANEL
BUN: 18 mg/dL (ref 6–23)
CALCIUM: 8.8 mg/dL (ref 8.4–10.5)
CO2: 26 meq/L (ref 19–32)
CREATININE: 0.86 mg/dL (ref 0.50–1.35)
Chloride: 106 mEq/L (ref 96–112)
GFR calc Af Amer: >90 mL/min (ref >90)
GFR calc non Af Amer: >90 mL/min (ref >90)
GLUCOSE: 120 mg/dL — AB (ref 70–99)
Potassium: 3.8 mEq/L (ref 3.7–5.3)
Sodium: 143 mEq/L (ref 137–147)

## 2013-08-08 LAB — PROTIME-INR
INR: 2.12 — ABNORMAL HIGH (ref 0.00–1.49)
Prothrombin Time: 23.1 seconds — ABNORMAL HIGH (ref 11.6–15.2)

## 2013-08-08 LAB — LACTATE DEHYDROGENASE: LDH: 948 U/L — ABNORMAL HIGH (ref 94–250)

## 2013-08-08 MED ORDER — INSULIN ASPART 100 UNIT/ML ~~LOC~~ SOLN: 0.0000 [IU] | Freq: Three times a day (TID) | SUBCUTANEOUS | Status: DC

## 2013-08-08 MED ORDER — WARFARIN SODIUM 10 MG PO TABS
10.0000 mg | ORAL_TABLET | Freq: Once | ORAL | Status: AC
  Administered 2013-08-08: 10 mg via ORAL
  Filled 2013-08-08: qty 1

## 2013-08-08 NOTE — Progress Notes (Addendum)
Patient ID: Eric LovelyUndra [name], male   DOB: 07/21/1960, 53 y.o.   MRN: [id number] Advanced Heart Failure Rounding Note  [name] is 53 y/o male with a history of severe CHF due to NICM EF 10-15%, and severe MR s/p HM II LVAD [facility] March 2013. His medical history also consists of CRI, NSVT s/p ICD and LV thrombus.   Admitted 1/24 with NSTEMI felt secondary due embolic phenomenon due to pump thrombosis.  LDH high around 900.  On way to cath lab 1/26 developed acute onset of aphasia and left-sided hemiparesis. CT negative for bleed. Taken emergently to neuro-interventional suite but no amenable lesions. Remains intubated on levophed. Remains on propofol gtt, unable to assess neuro status.  S/P TEE 1/28. No evidence of clot but with RV failure.   Extubated 1/30. Reintubated 2/2 due to inability control secretions. Trach placed due to difficulty with secretions. Able to take a few steps with PT on 2/13. Developed urinary retention with clots. Foley placed 2/14 . 300cc obtained.    Sitting in chair. Trach not downsized yet. Stable hemodynamically. Back on po lasix. MAPS 80-90. No complaints. Still cant move L arm much.    Hgb 11.6>>>> 8.5 >8.3  LDH:  [id number]>966>912>948 Co-ox: 65% , 58%, 59% INR 2.1 CVP 14-16  VAD interrogated personally.  Flow 4.6, Speed 9200 PI 6.9  Power 5.3  Events: occasional PI events  Filed Vitals:   08/08/13 0416 08/08/13 0500 08/08/13 0600 08/08/13 0700  BP: 91/77     Pulse: 100 98 98 94  Temp:      TempSrc:      Resp: 17 16 17 14   Height:      Weight:   85.6 kg (188 lb 11.4 oz)   SpO2: 96% 98% 98% 97%    Intake/Output Summary (Last 24 hours) at 08/08/13 0751 Last data filed at 08/08/13 0600  Gross per 24 hour  Intake   1936 ml  Output   1400 ml  Net    536 ml   Scheduled Meds: . allopurinol  100 mg Oral Daily  . antiseptic oral rinse  15 mL Mouth Rinse QID  . aspirin  81 mg Oral Daily  . chlorhexidine  15 mL Mouth Rinse BID  . clopidogrel  75 mg Oral Q  breakfast  . etomidate  40 mg Intravenous Once  . feeding supplement (PRO-STAT SUGAR FREE 64)  30 mL Per Tube QID  . furosemide  40 mg Oral Daily  . glycopyrrolate  1 mg Per Tube BID  . insulin aspart  0-15 Units Subcutaneous 6 times per day  . magnesium oxide  400 mg Oral Daily  . pantoprazole sodium  40 mg Per Tube Q1200  . sodium chloride  10-40 mL Intracatheter Q12H  . spironolactone  25 mg Oral Daily  . Warfarin - Pharmacist Dosing Inpatient   Does not apply q1800   Continuous Infusions: . sodium chloride 20 mL/hr at 08/07/13 1800  . feeding supplement (OSMOLITE 1.5 CAL) 1,000 mL (08/07/13 2041)  . heparin 1,200 Units/hr (08/07/13 2000)  . nitroGLYCERIN Stopped (07/17/13 1800)   PRN Meds:.acetaminophen, fentaNYL, hydrALAZINE, loperamide, nitroGLYCERIN, ondansetron (ZOFRAN) IV  LABS: Basic Metabolic Panel:  Recent Labs  95/62/1302/14/15 0340 08/08/13 0338  NA 144 143  K 3.9 3.8  CL 107 106  CO2 26 26  GLUCOSE 121* 120*  BUN 17 18  CREATININE 0.82 0.86  CALCIUM 8.7 8.8   Liver Function Tests: No results found for this basename: AST, ALT,  ALKPHOS, BILITOT, PROT, ALBUMIN,  in the last 72 hours No results found for this basename: LIPASE, AMYLASE,  in the last 72 hours CBC:  Recent Labs  08/07/13 0340 08/08/13 0338  WBC 7.4 6.6  HGB 8.5* 8.3*  HCT 27.2* 26.3*  MCV 92.5 91.6  PLT 378 383   Cardiac Enzymes: No results found for this basename: CKTOTAL, CKMB, CKMBINDEX, TROPONINI,  in the last 72 hours RADIOLOGY: Dg Chest Portable 1 View  07/17/2013   CLINICAL DATA:  Left-sided chest pain for 3 days, weakness and dizziness.  EXAM: PORTABLE CHEST - 1 VIEW  COMPARISON:  08/03/2012  FINDINGS: Evidence of median sternotomy noted with LVAD in place. Left-sided defibrillator noted. Mild prominence of the cardiac silhouette persists. Tricuspid valvuloplasty reidentified. Trace left pleural fluid or thickening persists. No new pulmonary opacity.  IMPRESSION: No new acute  abnormality. Stable appearance of trace left pleural fluid or thickening.   Electronically Signed   By: Christiana Pellant M.D.   On: 07/17/2013 10:12    PHYSICAL EXAM- CVP ~13 General: NAD; on trach collar; Sitting in chair.  Neck: JVP 1+; trach Lungs: coarse throughout CV: LVAD pump hum normal.   Abdomen: Soft, nontender, no hepatosplenomegaly, mild distention. Hypoactive BS Neurologic: awake interactive. Expressive aphasia. Moving left leg, slight movement left hand.   Extremities: No clubbing or cyanosis.  1+ edema RUE PICC  TELEMETRY: SR 90-100s    Assessment:   1. Acute embolic CVA 2. 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  3. 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 NICM EF 15%  5. Anemia 6. Shock - mixed picture RV failure/vasodilatory 7. Respiratory failure s/p trach 8. Urinary retention - Foley placed 2/14   Plan/Discussion:    He has had NSTEMI and acute CVA due to pump thrombosis/embolism. Now with expressive aphasia L hemiplegia. Making progress. Initially refused for CIR but will  continue to re-evaluate his candidacy as he improves - we will touch base with them again Monday 2/16. Given his complexity I would favor having him at CIR so we can follow closely if at all possible. Continue PT.   INR now therapeutic. LDH has plateaued at 950 - concerning for persistent pump thrombosis. Will continue heparin, coumadin, plavix, and asa for now. Overlap heparin/coumadin for 24 more hours. .   Continues on trach collar this am. Waiting for trach to be downsized so he can get PM valve. Sputum Cx - GNR/GPC. Await speciation. Has completed abx.  Deemed too high risk currently for PEG or J-tube.  To keep panda tube for now.   Hemodynamically stable off inotropes. Through RV still very tenuous. Needs CVP 12-15 due to RHF. Stable on lasix.  Urinary retention. Continue Foley. Will need to wean soon. UA ok   D/c art line.   I reviewed the LVAD parameters  from today, and compared the results to the patient's prior recorded data. No programming changes were made. The LVAD is functioning within specified parameters. The patient performs LVAD self-test daily. LVAD interrogation was negative for any significant power changes, alarms or PI events/speed drops. LVAD equipment check completed and is in good working order. Back-up equipment present. LVAD education done on emergency procedures and precautions and reviewed exit site care.  Alison Kubicki,MD 7:51 AM

## 2013-08-08 NOTE — Progress Notes (Addendum)
ANTICOAGULATION CONSULT NOTE - Follow Up Consult  Pharmacy Consult for heparin, coumadin Indication: LVAD thrombosis/CVA  Allergies  Allergen Reactions  . Ace Inhibitors Cough  . Lexapro [Escitalopram Oxalate] Other (See Comments)    somnolence    Patient Measurements: Height: 5\' 4"  (162.6 cm) Weight: 188 lb 11.4 oz (85.6 kg) IBW/kg (Calculated) : 59.2 Heparin Dosing Weight:   Vital Signs: Temp: 99 F (37.2 C) (02/15 1640) Temp src: Oral (02/15 1640) Pulse Rate: 98 (02/15 1600)  Labs:  Recent Labs  08/06/13 0430 08/06/13 1200 08/07/13 0340 08/08/13 0338  HGB 8.5*  --  8.5* 8.3*  HCT 27.1*  --  27.2* 26.3*  PLT 408*  --  378 383  LABPROT 21.9*  --  21.3* 23.1*  INR 1.98*  --  1.91* 2.12*  HEPARINUNFRC 0.28* 0.58 0.52 0.30  CREATININE 0.85  --  0.82 0.86    Estimated Creatinine Clearance: 101.5 ml/min (by C-G formula based on Cr of 0.86).   Medications:  Scheduled:  . allopurinol  100 mg Oral Daily  . antiseptic oral rinse  15 mL Mouth Rinse QID  . aspirin  81 mg Oral Daily  . chlorhexidine  15 mL Mouth Rinse BID  . clopidogrel  75 mg Oral Q breakfast  . etomidate  40 mg Intravenous Once  . feeding supplement (PRO-STAT SUGAR FREE 64)  30 mL Per Tube QID  . furosemide  40 mg Oral Daily  . glycopyrrolate  1 mg Per Tube BID  . insulin aspart  0-15 Units Subcutaneous TID  . magnesium oxide  400 mg Oral Daily  . pantoprazole sodium  40 mg Per Tube Q1200  . sodium chloride  10-40 mL Intracatheter Q12H  . spironolactone  25 mg Oral Daily  . Warfarin - Pharmacist Dosing Inpatient   Does not apply q1800    Assessment: 53 yr old [name] was admitted with NSTEMI thought to be secondary to pump thrombosis. On the way to the cath lab, he exhibited symptoms of a CVA. He was extubated 1/30 but due to inability to control secretions, he was again intubated and then a trach was placed. He remains on heparin with a level of 0.3 and an INR of 2.12. Heparin should be d/c'c  in the near future.  Goal of Therapy:  INR 2-3 Heparin level 0.3-0.7 units/ml Monitor platelets by anticoagulation protocol: Yes   Plan:  Will give coumadin 10 mg today. No change in the heparin dose. F/U INR and heparin level in the AM   [name] 08/08/2013,4:59 PM

## 2013-08-09 LAB — LACTATE DEHYDROGENASE: LDH: 960 U/L — ABNORMAL HIGH (ref 94–250)

## 2013-08-09 LAB — CBC
HEMATOCRIT: 27.4 % — AB (ref 39.0–52.0)
Hemoglobin: 8.6 g/dL — ABNORMAL LOW (ref 13.0–17.0)
MCH: 28.9 pg (ref 26.0–34.0)
MCHC: 31.4 g/dL (ref 30.0–36.0)
MCV: 91.9 fL (ref 78.0–100.0)
PLATELETS: 368 10*3/uL (ref 150–400)
RBC: 2.98 MIL/uL — AB (ref 4.22–5.81)
RDW: 15.6 % — ABNORMAL HIGH (ref 11.5–15.5)
WBC: 6.5 10*3/uL (ref 4.0–10.5)

## 2013-08-09 LAB — PROTIME-INR
INR: 2.26 — AB (ref 0.00–1.49)
Prothrombin Time: 24.2 seconds — ABNORMAL HIGH (ref 11.6–15.2)

## 2013-08-09 LAB — CULTURE, RESPIRATORY

## 2013-08-09 LAB — CULTURE, RESPIRATORY W GRAM STAIN: Special Requests: NORMAL

## 2013-08-09 LAB — GLUCOSE, CAPILLARY
Glucose-Capillary: 106 mg/dL — ABNORMAL HIGH (ref 70–99)
Glucose-Capillary: 121 mg/dL — ABNORMAL HIGH (ref 70–99)
Glucose-Capillary: 105 mg/dL — ABNORMAL HIGH (ref 70–99)

## 2013-08-09 LAB — HEPARIN LEVEL (UNFRACTIONATED): HEPARIN UNFRACTIONATED: 0.48 [IU]/mL (ref 0.30–0.70)

## 2013-08-09 LAB — CARBOXYHEMOGLOBIN
Carboxyhemoglobin: 2 % — ABNORMAL HIGH (ref 0.5–1.5)
METHEMOGLOBIN: 0.5 % (ref 0.0–1.5)
O2 Saturation: 61.2 %
Total hemoglobin: 10.5 g/dL — ABNORMAL LOW (ref 13.5–18.0)

## 2013-08-09 MED ORDER — WARFARIN SODIUM 10 MG PO TABS
10.0000 mg | ORAL_TABLET | Freq: Once | ORAL | Status: AC
  Administered 2013-08-09: 10 mg via ORAL
  Filled 2013-08-09: qty 1

## 2013-08-09 NOTE — Progress Notes (Signed)
Patient ID: Eric LovelyUndra [name], male   DOB: 12/03/1960, 53 y.o.   MRN: [id number] Advanced Heart Failure Rounding Note  Eric AstUndra is 53 y/o male with a history of severe CHF due to NICM EF 10-15%, and severe MR s/p HM II LVAD [facility]. His medical history also consists of CRI, NSVT s/p ICD and LV thrombus.   Admitted 1/24 with NSTEMI felt secondary due embolic phenomenon due to pump thrombosis.  LDH high around 900.  On way to cath lab 1/26 developed acute onset of aphasia and left-sided hemiparesis. CT negative for bleed. Taken emergently to neuro-interventional suite but no amenable lesions. Remains intubated on levophed. Remains on propofol gtt, unable to assess neuro status.  S/P TEE 1/28. No evidence of clot but with RV failure.   Extubated 1/30. Reintubated 2/2 due to inability control secretions. Trach placed due to difficulty with secretions. Able to take a few steps with PT on 2/13. Developed urinary retention with clots. Foley placed 2/14 . 300cc obtained.   Stable overnight. MAPs 90s. Remains on heparin, plavix, ASA and Coumadin. INR 2.2. Denies SOB, orthopnea, or CP. Abdominal pain and having bowel movements.  Hgb 11.6>>>> 8.5 >8.3>8.3  LDH:  982>966>912>948>960 Co-ox: 65% , 58%, 59%, 61% INR 2.2 CVP 14-16  VAD interrogated personally.  Flow 5.9, Speed 9200 PI 6.6 Power 5.9  Events: occasional PI events  Filed Vitals:   08/09/13 0500 08/09/13 0600 08/09/13 0700 08/09/13 0748  BP:      Pulse: 103 97 109   Temp:    98.2 F (36.8 C)  TempSrc:    Oral  Resp: 22 16 24    Height:      Weight:      SpO2: 100% 99% 100%     Intake/Output Summary (Last 24 hours) at 08/09/13 0757 Last data filed at 08/09/13 0700  Gross per 24 hour  Intake   2016 ml  Output    835 ml  Net   1181 ml   Scheduled Meds: . allopurinol  100 mg Oral Daily  . antiseptic oral rinse  15 mL Mouth Rinse QID  . aspirin  81 mg Oral Daily  . chlorhexidine  15 mL Mouth Rinse BID  . clopidogrel  75  mg Oral Q breakfast  . etomidate  40 mg Intravenous Once  . feeding supplement (PRO-STAT SUGAR FREE 64)  30 mL Per Tube QID  . furosemide  40 mg Oral Daily  . glycopyrrolate  1 mg Per Tube BID  . insulin aspart  0-15 Units Subcutaneous 3 times per day  . magnesium oxide  400 mg Oral Daily  . pantoprazole sodium  40 mg Per Tube Q1200  . sodium chloride  10-40 mL Intracatheter Q12H  . spironolactone  25 mg Oral Daily  . Warfarin - Pharmacist Dosing Inpatient   Does not apply q1800   Continuous Infusions: . sodium chloride 20 mL/hr at 08/08/13 1800  . feeding supplement (OSMOLITE 1.5 CAL) 1,000 mL (08/09/13 0015)  . heparin 1,200 Units/hr (08/09/13 0700)  . nitroGLYCERIN Stopped (07/17/13 1800)   PRN Meds:.acetaminophen, fentaNYL, hydrALAZINE, loperamide, nitroGLYCERIN, ondansetron (ZOFRAN) IV  LABS: Basic Metabolic Panel:  Recent Labs  40/98/1102/14/15 0340 08/08/13 0338  NA 144 143  K 3.9 3.8  CL 107 106  CO2 26 26  GLUCOSE 121* 120*  BUN 17 18  CREATININE 0.82 0.86  CALCIUM 8.7 8.8   Liver Function Tests: No results found for this basename: [name], ALT, ALKPHOS, BILITOT, PROT, ALBUMIN,  in the last 72 hours No results found for this basename: LIPASE, AMYLASE,  in the last 72 hours CBC:  Recent Labs  08/08/13 0338 08/09/13 0400  WBC 6.6 6.5  HGB 8.3* 8.6*  HCT 26.3* 27.4*  MCV 91.6 91.9  PLT 383 368   Cardiac Enzymes: No results found for this basename: CKTOTAL, CKMB, CKMBINDEX, TROPONINI,  in the last 72 hours RADIOLOGY: Dg Chest Portable 1 View  07/17/2013   CLINICAL DATA:  Left-sided chest pain for 3 days, weakness and dizziness.  EXAM: PORTABLE CHEST - 1 VIEW  COMPARISON:  08/03/2012  FINDINGS: Evidence of median sternotomy noted with LVAD in place. Left-sided defibrillator noted. Mild prominence of the cardiac silhouette persists. Tricuspid valvuloplasty reidentified. Trace left pleural fluid or thickening persists. No new pulmonary opacity.  IMPRESSION: No new acute  abnormality. Stable appearance of trace left pleural fluid or thickening.   Electronically Signed   By: Christiana Pellant M.D.   On: 07/17/2013 10:12    PHYSICAL EXAM- CVP ~13 General: NAD; on trach collar; Sitting in bed Neck: JVP 13; trach collar Lungs: coarse throughout CV: LVAD pump hum normal.   Abdomen: Soft, tender RLQ, no hepatosplenomegaly, mild distention. + BS Neurologic: awake interactive. Expressive aphasia. Moving left leg, slight movement left hand.   Extremities: No clubbing or cyanosis.  1+ edema RUE PICC  TELEMETRY: SR 90-100s    Assessment:   1. Acute embolic CVA 2. 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  3. 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 NICM EF 15%  5. Anemia 6. Shock - mixed picture RV failure/vasodilatory 7. Respiratory failure s/p trach 8. Urinary retention - Foley placed 2/14   Plan/Discussion:    He has had NSTEMI and acute CVA due to pump thrombosis/embolism. Now with expressive aphasia L hemiplegia. Making progress. Initially refused for CIR but will  ontinue to re-evaluate his candidacy as he improves - we will touch base with them again today. Given his complexity I would favor having him at CIR so we can follow closely if at all possible. Continue PT. Walked a few steps yesterday with PT and now able to move L hand a little more.   INR now therapeutic for 48 hrs now and LDH still rising 960. Will stop heparin today and continue coumadin, ASA and Plavix. Grave concern with urobilrinogen 4.0 last week and LDH continuing to rise that he is at a high risk of having another stroke. He is stable hemodynamically now and if the goal is to push forward aggressively with care and go to SNF, wonder when we need to consider pump exchange.   Continues on trach collar this am. Waiting for trach to be downsized so he can get PM valve. Sputum Cx - GNR/GPC. Await speciation. Has completed abx.  Deemed too high risk currently for PEG or  J-tube.  To keep panda tube for now.   Hemodynamically stable off inotropes. Through RV still very tenuous. Needs CVP 12-15 due to RHF. With urinary retention will continue foley one more day. Having LLQ abdominal pain and bowel movements. Will give imodium and continue to monitor.   Will stop frequent blood draws, no longer needs daily co-oxs or CBC and BMETs. Will schedule LDH, CBC and BMET for QOD.    Eric Potash B,MD 7:57 AM  Patient seen and examined with Eric Potash, NP. We discussed all aspects of the encounter. I agree with the assessment and plan as stated above.  Continues to improve. Walked 15 feet with PT today. Have asked CIR to re-evaluate. Needs to have trach downsized.   LDH remains elevated. H/H stable. Suggestive of ongoing low-grade hemolysis due to persistent pump thrombosis. I discussed this with Drs. Milano and [place] at [place] and will defer consideration of pump exchange until his RV and stroke symptoms recover further.   Sputum cx pending.   I reviewed the LVAD parameters from today, and compared the results to the patient's prior recorded data. No programming changes were made. The LVAD is functioning within specified parameters. The patient performs LVAD self-test daily. LVAD interrogation was negative for any significant power changes, alarms or PI events/speed drops. LVAD equipment check completed and is in good working order. Back-up equipment present. LVAD education done on emergency procedures and precautions and reviewed exit site care.  Eric Soderman,MD 1:59 PM

## 2013-08-09 NOTE — Progress Notes (Signed)
ANTICOAGULATION CONSULT NOTE - Follow Up Consult  Pharmacy Consult for heparin, coumadin Indication: LVAD thrombosis/CVA  Allergies  Allergen Reactions  . Ace Inhibitors Cough  . Lexapro [Escitalopram Oxalate] Other (See Comments)    somnolence    Patient Measurements: Height: 5\' 4"  (162.6 cm) Weight: 188 lb 11.4 oz (85.6 kg) IBW/kg (Calculated) : 59.2 Heparin Dosing Weight:   Vital Signs: Temp: 98.2 F (36.8 C) (02/16 0748) Temp src: Oral (02/16 0748) Pulse Rate: 99 (02/16 0900)  Labs:  Recent Labs  08/07/13 0340 08/08/13 0338 08/09/13 0400  HGB 8.5* 8.3* 8.6*  HCT 27.2* 26.3* 27.4*  PLT 378 383 368  LABPROT 21.3* 23.1* 24.2*  INR 1.91* 2.12* 2.26*  HEPARINUNFRC 0.52 0.30 0.48  CREATININE 0.82 0.86  --     Estimated Creatinine Clearance: 101.5 ml/min (by C-G formula based on Cr of 0.86).   Medications:  Scheduled:  . allopurinol  100 mg Oral Daily  . antiseptic oral rinse  15 mL Mouth Rinse QID  . aspirin  81 mg Oral Daily  . chlorhexidine  15 mL Mouth Rinse BID  . clopidogrel  75 mg Oral Q breakfast  . etomidate  40 mg Intravenous Once  . feeding supplement (PRO-STAT SUGAR FREE 64)  30 mL Per Tube QID  . furosemide  40 mg Oral Daily  . glycopyrrolate  1 mg Per Tube BID  . insulin aspart  0-15 Units Subcutaneous 3 times per day  . magnesium oxide  400 mg Oral Daily  . pantoprazole sodium  40 mg Per Tube Q1200  . sodium chloride  10-40 mL Intracatheter Q12H  . spironolactone  25 mg Oral Daily  . warfarin  10 mg Oral ONCE-1800  . Warfarin - Pharmacist Dosing Inpatient   Does not apply q1800    Assessment:  53 yr old Mr Fillinger was admitted with NSTEMI thought to be secondary to pump thrombosis. On the way to the cath lab, he exhibited symptoms of a CVA. He was extubated 1/30 but due to inability to control secretions, he was reintubated and then a trach was placed. He remains on heparin drip 1200 uts/hr  with a level of 0.48 and an INR of 2.26.  CBC  stable.  Heparin d/c today as INR > 2 x48hr.  Goal of Therapy:  INR 2-3 Monitor platelets by anticoagulation protocol: Yes   Plan:  Will give coumadin 10 mg today. Daily INR   Leota Sauers Pharm.D. CPP, BCPS Clinical Pharmacist [phone] 08/09/2013 10:04 AM

## 2013-08-09 NOTE — Progress Notes (Signed)
Occupational Therapy Treatment Patient Details Name: [name] MRN: [id number] DOB: 11/30/1960 Today's Date: 08/09/2013 Time: [id number] OT Time Calculation (min): 54 min  OT Assessment / Plan / Recommendation  History of present illness [name] is 53 y/o male with a history of severe CHF due to NICM EF 10-15% s/p HM II LVAD placed at [facility] March 2013. His medical history also consists of CRI, NSVT s/p ICD and LV thrombus. Admitted 1/24 with NSTEMI felt secondary due embolic phenomenon due to pump thrombosis. 1/26 developed acute onset of aphasia and left-sided hemiparesis due to acute right MCA embolic CVA. Extubated 1/30. Reintubated 2/2 due to inability control secretions. Trach 2/4    OT comments  Pt continues to make excellent progress with functional transfers, balance, activity tolerance.  Co-treat with PT - pt ambulated ~18' forward and back x 2 with min A+2.   Worked with pt using a one handed clamp in prep for devising a method for managing VAD equipment with one hand.  Pt. With grasp/release Lt. Hand with max A - demonstrates ideomotor apraxia.  Continue to recommend CIR as pt demonstrates daily improvements and should be able to discharge home at min A - supervision after CIR.   Follow Up Recommendations  CIR;Supervision/Assistance - 24 hour    Barriers to Discharge       Equipment Recommendations  3 in 1 bedside comode    Recommendations for Other Services Rehab consult  Frequency Min 3X/week   Progress towards OT Goals Progress towards OT goals: Progressing toward goals  Plan Discharge plan remains appropriate    Precautions / Restrictions Precautions Precautions: Fall Precaution Comments: LVAD   Pertinent Vitals/Pain     ADL  Grooming: Teeth care;Supervision/safety;Set up;Wash/dry face Where Assessed - Grooming: Supported sitting Toilet Transfer: Minimal assistance (+2) Toilet Transfer Method: Sit to stand;Stand pivot Toilet Transfer Equipment: Bedside  commode Toileting - Clothing Manipulation and Hygiene: Maximal assistance Where Assessed - Toileting Clothing Manipulation and Hygiene: Standing Transfers/Ambulation Related to ADLs: min A +2 HHA ADL Comments: Pt able to grasp and release object with max assist.  Worked with pt with one handed clamp to secure power lines and unhook power cord - albe to perform with mod A.   Pt ambulated ~18' forward and backward x 2 with min A +2    OT Diagnosis:    OT Problem List:   OT Treatment Interventions:     OT Goals(current goals can now be found in the care plan section) Acute Rehab OT Goals Patient Stated Goal: Pt nodded head in agrement to OT goals OT Goal Formulation: With patient Time For Goal Achievement: 08/16/13 Potential to Achieve Goals: Good ADL Goals Pt Will Perform Grooming: with supervision;sitting Pt Will Perform Upper Body Bathing: with min assist;sitting Pt Will Perform Lower Body Bathing: sit to/from stand;with mod assist Pt Will Perform Upper Body Dressing: sitting;with mod assist Pt Will Perform Lower Body Dressing: with max assist;sit to/from stand Pt Will Transfer to Toilet: with min assist;stand pivot transfer;bedside commode Pt Will Perform Toileting - Clothing Manipulation and hygiene: with mod assist;sit to/from stand Additional ADL Goal #1: Pt will use Lt. UE as a stabilizer during BADLs Additional ADL Goal #2: Pt will be able to manage LVAD equipment with mod A  Visit Information  Last OT Received On: 08/09/13 Assistance Needed: +2 PT/OT/SLP Co-Evaluation/Treatment: Yes Reason for Co-Treatment: Complexity of the patient's impairments (multi-system involvement) OT goals addressed during session: ADL's and self-care;Other (comment) (functional transfers) History of Present Illness: [name] is  53 y/o male with a history of severe CHF due to NICM EF 10-15% s/p HM II LVAD placed at [facility] March 2013. His medical history also consists of CRI, NSVT s/p ICD and LV thrombus.  Admitted 1/24 with NSTEMI felt secondary due embolic phenomenon due to pump thrombosis. 1/26 developed acute onset of aphasia and left-sided hemiparesis due to acute right MCA embolic CVA. Extubated 1/30. Reintubated 2/2 due to inability control secretions. Trach 2/4     Subjective Data      Prior Functioning       Cognition  Cognition Arousal/Alertness: Awake/alert Behavior During Therapy: Flat affect Overall Cognitive Status: Within Functional Limits for tasks assessed Difficult to assess due to: Tracheostomy    Mobility  Bed Mobility Overal bed mobility: Needs Assistance Bed Mobility: Sit to Supine Supine to sit: Mod assist General bed mobility comments: Pt able to bridge to scoot hips across bed with min facilitation.  Assist to lift shoulders Transfers Overall transfer level: Needs assistance Equipment used: 2 person hand held assist Transfers: Sit to/from Stand Sit to Stand: Min assist Stand pivot transfers: Min assist;+2 physical assistance General transfer comment: Pt able to advance Rt. LE today and no buckling of Lt. LE    Exercises      Balance Balance Overall balance assessment: Needs assistance Sitting-balance support: No upper extremity supported;Feet supported;Feet unsupported Sitting balance-Leahy Scale: Good Standing balance support: Single extremity supported Standing balance-Leahy Scale: Poor Standing balance comment: Needs Rt. UE support for balance  End of Session OT - End of Session Activity Tolerance: Patient tolerated treatment well Patient left: in chair;with call bell/phone within reach;with nursing/sitter in room Nurse Communication: Mobility status  GO     [name], [name] 08/09/2013, 1:50 PM

## 2013-08-09 NOTE — Progress Notes (Signed)
Physical Therapy Treatment Patient Details Name: [name] MRN: [id number] DOB: [birth year] Today's Date: 08/09/2013 Time: 8366-2947 PT Time Calculation (min): 46 min  PT Assessment / Plan / Recommendation  History of Present Illness [name] is 53 y/o male with a history of severe CHF due to NICM EF 10-15% s/p HM II LVAD placed at [facility] March 2013. His medical history also consists of CRI, NSVT s/p ICD and LV thrombus. Admitted 1/24 with NSTEMI felt secondary due embolic phenomenon due to pump thrombosis. 1/26 developed acute onset of aphasia and left-sided hemiparesis due to acute right MCA embolic CVA. Extubated 1/30. Reintubated 2/2 due to inability control secretions. Trach 2/4    PT Comments   Pt moving in a much more coordinated manner.  Less apraxia with gait, but mildly ataxic; with improved heel toe gait pattern given 2 person control at trunk.   Managing secretions better.  Completely stable from an LVAD standpoint.  Great rehab candidate.   Follow Up Recommendations  CIR     Does the patient have the potential to tolerate intense rehabilitation     Barriers to Discharge        Equipment Recommendations  Other (comment)    Recommendations for Other Services    Frequency Min 4X/week   Progress towards PT Goals Progress towards PT goals: Progressing toward goals  Plan Current plan remains appropriate    Precautions / Restrictions Precautions Precautions: Fall Precaution Comments: LVAD   Pertinent Vitals/Pain HR 100's to EHR 110's,  Sats on RA 98%    Mobility  Bed Mobility Overal bed mobility: Needs Assistance Bed Mobility: Sit to Supine Supine to sit: Mod assist General bed mobility comments: Pt able to bridge to scoot hips across bed with min facilitation.  Assist to lift shoulders Transfers Overall transfer level: Needs assistance Equipment used: 2 person hand held assist Transfers: Sit to/from Stand Sit to Stand: Min assist Stand pivot transfers: Min assist;+2  physical assistance General transfer comment: Pt able to advance Rt. LE today and no buckling of Lt. LE Ambulation/Gait Ambulation/Gait assistance: Min assist;+2 safety/equipment (to occaisional mod assist normalizing backing up) Ambulation Distance (Feet): 12 Feet (Forward and back times 2) Assistive device: 2 person hand held assist Gait Pattern/deviations: Step-through pattern;Decreased step length - right;Step-to pattern;Decreased step length - left;Ataxic;Wide base of support (almost no s/s of apraxia, mild ataxia R Le) General Gait Details: Pt requires physical assist for control of w/shift and upper trunk, control/assist for LE ataxia.  However, pt showed much more coordination of normal heel/toe pattern bother forward and backward. Modified Rankin (Stroke Patients Only) Pre-Morbid Rankin Score: No symptoms    Exercises     PT Diagnosis:    PT Problem List:   PT Treatment Interventions:     PT Goals (current goals can now be found in the care plan section) Acute Rehab PT Goals Patient Stated Goal: Pt nodded head in agrement to OT goals PT Goal Formulation: With patient Time For Goal Achievement: 08/13/13 Potential to Achieve Goals: Good  Visit Information  Last PT Received On: 08/09/13 Assistance Needed: +2 PT/OT/SLP Co-Evaluation/Treatment: Yes Reason for Co-Treatment: Complexity of the patient's impairments (multi-system involvement) PT goals addressed during session: Mobility/safety with mobility OT goals addressed during session: ADL's and self-care;Other (comment) (functional transfers) History of Present Illness: [name] is 53 y/o male with a history of severe CHF due to NICM EF 10-15% s/p HM II LVAD placed at [facility] March 2013. His medical history also consists of CRI, NSVT s/p ICD  and LV thrombus. Admitted 1/24 with NSTEMI felt secondary due embolic phenomenon due to pump thrombosis. 1/26 developed acute onset of aphasia and left-sided hemiparesis due to acute right MCA  embolic CVA. Extubated 1/30. Reintubated 2/2 due to inability control secretions. Trach 2/4     Subjective Data  Subjective: pt nodding appropriately to questions Patient Stated Goal: Pt nodded head in agrement to OT goals   Cognition  Cognition Arousal/Alertness: Awake/alert Behavior During Therapy: Flat affect Overall Cognitive Status: Within Functional Limits for tasks assessed Difficult to assess due to: Tracheostomy    Balance  Balance Overall balance assessment: Needs assistance Sitting-balance support: No upper extremity supported;Feet unsupported Sitting balance-Leahy Scale: Good Standing balance support: Single extremity supported Standing balance-Leahy Scale: Poor Standing balance comment: Needs Rt. UE support for balance  End of Session PT - End of Session Equipment Utilized During Treatment: Oxygen (on/off TC as able to amb  now at 28%TC) Activity Tolerance: Patient tolerated treatment well Patient left: in chair;with call bell/phone within reach;with nursing/sitter in room Nurse Communication: Mobility status   GP     Clydia Nieves, Eliseo GumKenneth V 08/09/2013, 2:10 PM 08/09/2013  Lackawanna BingKen Abuk Selleck, PT [phone]  (pager)

## 2013-08-10 LAB — GLUCOSE, CAPILLARY
GLUCOSE-CAPILLARY: 90 mg/dL (ref 70–99)
Glucose-Capillary: 110 mg/dL — ABNORMAL HIGH (ref 70–99)
Glucose-Capillary: 92 mg/dL (ref 70–99)
Glucose-Capillary: 124 mg/dL — ABNORMAL HIGH (ref 70–99)

## 2013-08-10 LAB — PROTIME-INR
INR: 2.62 — ABNORMAL HIGH (ref 0.00–1.49)
Prothrombin Time: 27.1 seconds — ABNORMAL HIGH (ref 11.6–15.2)

## 2013-08-10 LAB — LACTATE DEHYDROGENASE: LDH: 958 U/L — ABNORMAL HIGH (ref 94–250)

## 2013-08-10 LAB — CBC
HEMATOCRIT: 27.8 % — AB (ref 39.0–52.0)
HEMOGLOBIN: 8.8 g/dL — AB (ref 13.0–17.0)
MCH: 29.1 pg (ref 26.0–34.0)
MCHC: 31.7 g/dL (ref 30.0–36.0)
MCV: 92.1 fL (ref 78.0–100.0)
Platelets: 343 10*3/uL (ref 150–400)
RBC: 3.02 MIL/uL — AB (ref 4.22–5.81)
RDW: 15.7 % — ABNORMAL HIGH (ref 11.5–15.5)
WBC: 6.1 10*3/uL (ref 4.0–10.5)

## 2013-08-10 LAB — BASIC METABOLIC PANEL
BUN: 19 mg/dL (ref 6–23)
CALCIUM: 9 mg/dL (ref 8.4–10.5)
CO2: 28 meq/L (ref 19–32)
CREATININE: 0.89 mg/dL (ref 0.50–1.35)
Chloride: 105 mEq/L (ref 96–112)
GFR calc Af Amer: >90 mL/min (ref >90)
GFR calc non Af Amer: >90 mL/min (ref >90)
GLUCOSE: 116 mg/dL — AB (ref 70–99)
Potassium: 3.7 mEq/L (ref 3.7–5.3)
Sodium: 143 mEq/L (ref 137–147)

## 2013-08-10 MED ORDER — CITALOPRAM HYDROBROMIDE 10 MG PO TABS
10.0000 mg | ORAL_TABLET | Freq: Every day | ORAL | Status: DC
  Administered 2013-08-10 – 2013-08-11 (×2): 10 mg via ORAL
  Filled 2013-08-10 (×2): qty 1

## 2013-08-10 MED ORDER — WARFARIN SODIUM 7.5 MG PO TABS
7.5000 mg | ORAL_TABLET | Freq: Every day | ORAL | Status: DC
  Administered 2013-08-10: 7.5 mg via ORAL
  Filled 2013-08-10 (×2): qty 1

## 2013-08-10 MED ORDER — POTASSIUM CHLORIDE CRYS ER 20 MEQ PO TBCR
20.0000 meq | EXTENDED_RELEASE_TABLET | Freq: Once | ORAL | Status: AC
  Administered 2013-08-10: 20 meq via ORAL
  Filled 2013-08-10: qty 1

## 2013-08-10 NOTE — Progress Notes (Signed)
Occupational Therapy Treatment Patient Details Name: [name] MRN: [id number] DOB: 06/20/1961 Today's Date: 08/10/2013 Time: 3491-7915 OT Time Calculation (min): 49 min  OT Assessment / Plan / Recommendation  History of present illness [name] is 53 y/o male with a history of severe CHF due to NICM EF 10-15% s/p HM II LVAD placed at [facility] March 2013. His medical history also consists of CRI, NSVT s/p ICD and LV thrombus. Admitted 1/24 with NSTEMI felt secondary due embolic phenomenon due to pump thrombosis. 1/26 developed acute onset of aphasia and left-sided hemiparesis due to acute right MCA embolic CVA. Extubated 1/30. Reintubated 2/2 due to inability control secretions. Trach 2/4    OT comments  Pt continues to make excellent progress.  Demonstrates improved use/movement/function of Lt. UE today!!  Transferred recliner to bed with +1 min A.  Recommend CIR  Follow Up Recommendations  CIR;Supervision/Assistance - 24 hour    Barriers to Discharge       Equipment Recommendations  3 in 1 bedside comode    Recommendations for Other Services Rehab consult  Frequency Min 3X/week   Progress towards OT Goals Progress towards OT goals: Progressing toward goals  Plan Discharge plan remains appropriate    Precautions / Restrictions Precautions Precautions: Fall Precaution Comments: LVAD   Pertinent Vitals/Pain     ADL  Eating/Feeding: NPO Toilet Transfer: Minimal assistance Toilet Transfer Method: Sit to stand;Stand pivot Toilet Transfer Equipment: Bedside commode Toileting - Clothing Manipulation and Hygiene: Maximal assistance Transfers/Ambulation Related to ADLs: Min A for transfers; min A +2 for ambulation  ADL Comments: Treatment focused on facilitation of reach with Lt. UE.  Pt now able to reach for retrieve and give therapist a lotion bottle at ~45* shoulder flexion with min - mod facilitation (variable performance).  Ideomotor apraxia remains.  Discussed with pt need to attempt  to use and move Lt. UE throughout the day and instructed him on movements and activities for him to perform.  he was able to return demonstration     OT Diagnosis:    OT Problem List:   OT Treatment Interventions:     OT Goals(current goals can now be found in the care plan section) Acute Rehab OT Goals Patient Stated Goal: Pt nodded head in agrement to OT goals OT Goal Formulation: With patient Time For Goal Achievement: 08/16/13 Potential to Achieve Goals: Good ADL Goals Pt Will Perform Grooming: with supervision;sitting Pt Will Perform Upper Body Bathing: with min assist;sitting Pt Will Perform Lower Body Bathing: sit to/from stand;with mod assist Pt Will Perform Upper Body Dressing: sitting;with mod assist Pt Will Perform Lower Body Dressing: with max assist;sit to/from stand Pt Will Transfer to Toilet: with min assist;stand pivot transfer;bedside commode Pt Will Perform Toileting - Clothing Manipulation and hygiene: with mod assist;sit to/from stand Additional ADL Goal #1: Pt will use Lt. UE as a stabilizer during BADLs Additional ADL Goal #2: Pt will be able to manage LVAD equipment with mod A  Visit Information  Last OT Received On: 08/10/13 Assistance Needed: +1 (for transfers; +2 for ambulation ) History of Present Illness: [name] is 53 y/o male with a history of severe CHF due to NICM EF 10-15% s/p HM II LVAD placed at [facility] March 2013. His medical history also consists of CRI, NSVT s/p ICD and LV thrombus. Admitted 1/24 with NSTEMI felt secondary due embolic phenomenon due to pump thrombosis. 1/26 developed acute onset of aphasia and left-sided hemiparesis due to acute right MCA embolic CVA. Extubated 1/30. Reintubated  2/2 due to inability control secretions. Trach 2/4     Subjective Data      Prior Functioning       Cognition  Cognition Arousal/Alertness: Awake/alert Behavior During Therapy: WFL for tasks assessed/performed Overall Cognitive Status: Within Functional  Limits for tasks assessed (No obvious cognitive deficits noted) Difficult to assess due to: Tracheostomy    Mobility  Bed Mobility Overal bed mobility: Needs Assistance Bed Mobility: Sit to Supine Sit to supine: Min assist General bed mobility comments: Pt maneuvered all cords/lines and was able to move to supine with assist only for Lt. LE Transfers Overall transfer level: Needs assistance Equipment used: 1 person hand held assist Transfers: Sit to/from [employer] CorporationStand;Stand Pivot Transfers Sit to Stand: Min assist Stand pivot transfers: Min assist General transfer comment: Requires increased time    Exercises      Balance Balance Overall balance assessment: Needs assistance Sitting balance-Leahy Scale: Good Standing balance support: Single extremity supported Standing balance-Leahy Scale: Fair Standing balance comment: could maintain midline steadily with 1 limb support.  Became mildly more unsteady during w/shift with only  1 UE support.  End of Session OT - End of Session Equipment Utilized During Treatment: Oxygen (28% FIO2 via trach collar) Activity Tolerance: Patient tolerated treatment well Patient left: in bed;with call bell/phone within reach Nurse Communication: Mobility status  GO     [name], [name] 08/10/2013, 4:54 PM

## 2013-08-10 NOTE — Progress Notes (Signed)
Patient ID: [name], male   DOB: 07/11/1960, 53 y.o.   MRN: [id number] Advanced Heart Failure Rounding Note  [name] is 53 y/o male with a history of severe CHF due to NICM EF 10-15%, and severe MR s/p HM II LVAD [facility] March [facility]. His medical history also consists of CRI, NSVT s/p ICD and LV thrombus.   Admitted 1/24 with NSTEMI felt secondary due embolic phenomenon due to pump thrombosis.  LDH high around 900.  On way to cath lab 1/26 developed acute onset of aphasia and left-sided hemiparesis. CT negative for bleed. Taken emergently to neuro-interventional suite but no amenable lesions. Remains intubated on levophed. Remains on propofol gtt, unable to assess neuro status.  S/P TEE 1/28. No evidence of clot but with RV failure.   Extubated 1/30. Reintubated 2/2 due to inability control secretions. Trach placed due to difficulty with secretions. Able to take a few steps with PT on 2/13. Developed urinary retention with clots. Foley placed 2/14 . 300cc obtained.   Stable overnight. MAPs 90s. Remains on heparin, plavix, ASA and Coumadin. INR 2.2. Denies SOB, orthopnea, or CP. Abdominal pain and having bowel movements.  Hgb 11.6>>>> 8.5 >8.3>8.3>8.8  LDH:  982>966>912>948>960>958 INR 2.6   VAD interrogated personally.  Flow 5.4, Speed 9200 PI 6.0 Power 6.0  Events: frequent PI events  Filed Vitals:   08/10/13 0800 08/10/13 0841 08/10/13 0851 08/10/13 0900  BP:      Pulse: 95 97  97  Temp:   98.2 F (36.8 C)   TempSrc:   Oral   Resp: 16 20  17   Height:      Weight:      SpO2: 99% 97%  100%    Intake/Output Summary (Last 24 hours) at 08/10/13 1048 Last data filed at 08/10/13 1000  Gross per 24 hour  Intake   1805 ml  Output    976 ml  Net    829 ml   Scheduled Meds: . allopurinol  100 mg Oral Daily  . antiseptic oral rinse  15 mL Mouth Rinse QID  . aspirin  81 mg Oral Daily  . chlorhexidine  15 mL Mouth Rinse BID  . clopidogrel  75 mg Oral Q breakfast  . etomidate   40 mg Intravenous Once  . feeding supplement (PRO-STAT SUGAR FREE 64)  30 mL Per Tube QID  . furosemide  40 mg Oral Daily  . glycopyrrolate  1 mg Per Tube BID  . insulin aspart  0-15 Units Subcutaneous 3 times per day  . magnesium oxide  400 mg Oral Daily  . pantoprazole sodium  40 mg Per Tube Q1200  . potassium chloride  20 mEq Oral Once  . sodium chloride  10-40 mL Intracatheter Q12H  . spironolactone  25 mg Oral Daily  . warfarin  7.5 mg Oral q1800  . Warfarin - Pharmacist Dosing Inpatient   Does not apply q1800   Continuous Infusions: . sodium chloride 20 mL/hr (08/09/13 1358)  . feeding supplement (OSMOLITE 1.5 CAL) 1,000 mL (08/09/13 0015)  . nitroGLYCERIN Stopped (07/17/13 1800)   PRN Meds:.acetaminophen, fentaNYL, hydrALAZINE, loperamide, nitroGLYCERIN, ondansetron (ZOFRAN) IV  LABS: Basic Metabolic Panel:  Recent Labs  40/98/1102/15/15 0338 08/10/13 0500  NA 143 143  K 3.8 3.7  CL 106 105  CO2 26 28  GLUCOSE 120* 116*  BUN 18 19  CREATININE 0.86 0.89  CALCIUM 8.8 9.0   Liver Function Tests: No results found for this basename: AST, ALT, ALKPHOS,  BILITOT, PROT, ALBUMIN,  in the last 72 hours No results found for this basename: LIPASE, AMYLASE,  in the last 72 hours CBC:  Recent Labs  08/09/13 0400 08/10/13 0500  WBC 6.5 6.1  HGB 8.6* 8.8*  HCT 27.4* 27.8*  MCV 91.9 92.1  PLT 368 343   Cardiac Enzymes: No results found for this basename: CKTOTAL, CKMB, CKMBINDEX, TROPONINI,  in the last 72 hours RADIOLOGY: Dg Chest Portable 1 View  07/17/2013   CLINICAL DATA:  Left-sided chest pain for 3 days, weakness and dizziness.  EXAM: PORTABLE CHEST - 1 VIEW  COMPARISON:  08/03/2012  FINDINGS: Evidence of median sternotomy noted with LVAD in place. Left-sided defibrillator noted. Mild prominence of the cardiac silhouette persists. Tricuspid valvuloplasty reidentified. Trace left pleural fluid or thickening persists. No new pulmonary opacity.  IMPRESSION: No new acute  abnormality. Stable appearance of trace left pleural fluid or thickening.   Electronically Signed   By: Christiana Pellant M.D.   On: 07/17/2013 10:12    PHYSICAL EXAM- CVP ~13 General: NAD; on trach collar; Sitting in chair Neck: JVP 13; trach collar Lungs: coarse throughout CV: LVAD pump hum normal.   Abdomen: Nontender no hepatosplenomegaly, mild distention. + BS Neurologic: awake interactive. Expressive aphasia. Moving left leg, slight movement left hand.   Extremities: No clubbing or cyanosis.  1+ edema RUE PICC  TELEMETRY: SR 90-100s    Assessment:   1. Acute embolic CVA 2. 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  3. 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 NICM EF 15%  5. Anemia 6. Shock - mixed picture RV failure/vasodilatory 7. Respiratory failure s/p trach 8. Urinary retention - Foley placed 2/14   Plan/Discussion:    He has had NSTEMI and acute CVA due to pump thrombosis/embolism. Now with expressive aphasia L hemiplegia. Making progress. Plan to transfer to 2W today then CIR probably Thursday when refresher training complete.  INR therapeutic. LDH remains stable in 950 range. Suggestive of ongoing low-grade hemolysis due to persistent pump thrombosis. I discussed this with Drs. Milano and Kensett at [place] and will defer consideration of pump exchange until his RV and stroke symptoms recover further. Continue coumadin, ASA and Plavix.  Continues on trach collar this am. Waiting for trach to be downsized so he can get PM valve. I d/w Dr. Sung Amabile this am.  Sputum Cx - normal flora. Deemed too high risk currently for PEG or J-tube.  To keep panda tube for now.   Hemodynamically stable off inotropes. Through RV still very tenuous. Needs CVP 12-15 due to RHF. With urinary retention will continue foley one more day.   VAD interrogated personally. Stable.    Daniel Bensimhon,MD 10:48 AM

## 2013-08-10 NOTE — Progress Notes (Addendum)
Tracheostomy Change Note  Shiley #8 cuffed changed out for Shiley #6 cuffless using tube changer. No complications.   Good color change on ETCO2. Minimal blood.  Rutherford Guys, PA - [facility] Pgr: [phone]  or [phone]  I was present and supervised the entire procedure.  Alyson Reedy, M.D. [facility] Pulmonary/Critical Care Medicine. Pager: [phone]. After hours pager: [phone].

## 2013-08-10 NOTE — Progress Notes (Signed)
ANTICOAGULATION CONSULT NOTE - Follow Up Consult  Pharmacy Consult for heparin, coumadin Indication: LVAD thrombosis/CVA  Allergies  Allergen Reactions  . Ace Inhibitors Cough  . Lexapro [Escitalopram Oxalate] Other (See Comments)    somnolence    Patient Measurements: Height: 5\' 4"  (162.6 cm) Weight: 186 lb 15.2 oz (84.8 kg) IBW/kg (Calculated) : 59.2 Heparin Dosing Weight:   Vital Signs: Temp: 98.2 F (36.8 C) (02/17 0851) Temp src: Oral (02/17 0851) Pulse Rate: 97 (02/17 0900)  Labs:  Recent Labs  08/08/13 0338 08/09/13 0400 08/10/13 0500  HGB 8.3* 8.6* 8.8*  HCT 26.3* 27.4* 27.8*  PLT 383 368 343  LABPROT 23.1* 24.2* 27.1*  INR 2.12* 2.26* 2.62*  HEPARINUNFRC 0.30 0.48  --   CREATININE 0.86  --  0.89    Estimated Creatinine Clearance: 97.5 ml/min (by C-G formula based on Cr of 0.89).   Medications:  Scheduled:  . allopurinol  100 mg Oral Daily  . antiseptic oral rinse  15 mL Mouth Rinse QID  . aspirin  81 mg Oral Daily  . chlorhexidine  15 mL Mouth Rinse BID  . clopidogrel  75 mg Oral Q breakfast  . etomidate  40 mg Intravenous Once  . feeding supplement (PRO-STAT SUGAR FREE 64)  30 mL Per Tube QID  . furosemide  40 mg Oral Daily  . glycopyrrolate  1 mg Per Tube BID  . insulin aspart  0-15 Units Subcutaneous 3 times per day  . magnesium oxide  400 mg Oral Daily  . pantoprazole sodium  40 mg Per Tube Q1200  . sodium chloride  10-40 mL Intracatheter Q12H  . spironolactone  25 mg Oral Daily  . warfarin  7.5 mg Oral q1800  . Warfarin - Pharmacist Dosing Inpatient   Does not apply q1800    Assessment:  53 yr old Mr [name] was admitted with NSTEMI thought to be secondary to pump thrombosis. On the way to the cath lab, he exhibited symptoms of a CVA. He was extubated 1/30 but due to inability to control secretions, he was reintubated and then a trach was placed. Heparin drip dtopped 2/16 after INR > 2 x48hr. CBC stable. INR 2.6   Goal of Therapy:  INR  2-3 Monitor platelets by anticoagulation protocol: Yes   Plan:  Coumadin 7.5 mg daily Daily INR   Leota Sauers Pharm.D. CPP, BCPS Clinical Pharmacist [phone] 08/10/2013 9:51 AM

## 2013-08-10 NOTE — Progress Notes (Signed)
Rehab admissions - I was asked to re evaluated for possible admission to acute inpatient rehab.  I met with patient this am and he would like to come to inpatient rehab.  I called his girl friend, Kandis Ban, at [phone].  She is willing to come stay with patient once he comes to inpatient rehab.  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  She goes to school, but she can take time off.  Noted trach to be downsized today.  Hopefully we can start passe muir so that patient can communicate with [place].  I am hopeful that we can offer patient a rehab bed in the next 1-2 days.  Call me for questions.  #[phone]

## 2013-08-10 NOTE — Progress Notes (Signed)
Pt seen for trach consult. Pt on 24/7 ATC. Spoke with RN- possible downsize today to #6. Trach team will continue to follow for pt progression.

## 2013-08-10 NOTE — Progress Notes (Signed)
Speech Language Pathology Treatment: [name] Speaking valve  Patient Details Name: [name] MRN: [id number] DOB: [birth year] Today's Date: 08/10/2013 Time: 1330-1350 SLP Time Calculation (min): 20 min  Assessment / Plan / Recommendation Clinical Impression  Pt still with cuffed #8 Shiley with cuff deflated upon SLP arrival. Pt followed cues to cough to tracheally clear mild audible congestion. Pt continues to tolerate PMSV for only 1-2 respiratory cycles with no evidence of upper airway patency observed. Pt still awaiting trach downsize - per RN will likely be today. Will plan to f/u after downsize when pt will likely be able to tolerate PMSV for increased length of time.   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 He is s/p LVAD placement at [facility] March 2013, as bridge to transplant. Admitted for evaluation of new NSTEMI felt possibly to be related to pump thrombosis. Hosp course complicated by episodes of CP and hypotension on 1/24 pm hours. 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  Initial intubation 1/26-1/30; bedside swallow eval on 1/31 revealed severe motor based oropharyngeal dysphagia with cranial nerve involvement significantly limiting oral strength and ROM; recs were for NPO.   Pt re-intubated 2/1-2/4; trach 2/4.  Currently with #8 Shiley, cuff partially deflated.  Off vent since 2/5.     Pertinent Vitals N/A  SLP Plan  Continue with current plan of care    Recommendations        Patient may use Passy-Muir Speech Valve: with SLP only MD: Please consider changing trach tube to : Smaller size;Cuffless       General recommendations: Rehab consult Oral Care Recommendations: Oral care Q4 per protocol Follow up Recommendations: Inpatient Rehab Plan: Continue with current plan of care    GO      Maxcine Ham, M.A. CCC-SLP [phone]  Maxcine Ham 08/10/2013, 1:59 PM

## 2013-08-10 NOTE — Discharge Summary (Signed)
Advanced Heart Failure Team  Discharge Summary   Patient ID: [name] MRN: [id number], DOB/AGE: 51/08/1960 51 y.o. 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 His medical history also consists of CRI, NSVT s/p ICD and LV thrombus.  He has been followed closely in the HF clinic as well as [facility]. He was in the process of being worked up for possible transplant and was doing well until 07/17/13 when he presented to the hospital with complaints of CP with diaphoresis and radiation down R arm. On presentation to the ER his CP resolved and he received ASA, ECG was unchanged from previous, initial troponin 12 and LDH 976. Prior to presentation he reported that his INR had been subtherapeutic (1.2) and increased his Coumadin. [facility] and he was admitted for cardiac catheterization Monday. His ASA and Plavix were continued along with initiation of heparin and his Coumadin was placed on hold. He had no evidence of HF or pump dysfunction.   He had a subsequent episode of CP and was given SL nitro, morphine and started on NTG gtt. Which relieved his pain. Unfortunately he became hypotensive with low flow alarm. He was started on norephinephrine and [facility] 07[facility]. Troponin increased to 17. PICC was placed. His INR continued to trend up and with his NSTEMI and intermittent CP. On 1/26 on the way to the cath lab the patient developed acute L sided  weakness and aphasia and a Code Stroke was called.He was taken to IR emergently for cerebral angiogram and possible clot retrieval which showed small filling defects in the orbitofrontal branch of RT MCA sup division,and tertiary angular division of RT MCA. There was no target for intervention.  He was unable to protect his airway and clear his secretions and therefore was intubated. He then underwent a TEE to further evaluate inflow cannula for clot and confirm adequate LV unloading. TEE showed no evidence of clot associated with inflow cannula, LV well unloaded and dilated severely hypokinetc RV. He also developed transient afib with RVR and was placed on amio gtt which was eventually stopped without recurrence of AF.  [name] was extubated on 1/30, however unfortunately could not protect his airway and clear his secretions and was emergently re-intubated on 07/25/13. Due to the emergent and traumatic re-intubation he developed bleeding in his nares and mouth. ENT was consulted and they were able to pack his nose and his Heparin was held. Lengthy discussions took place with Neurology and they felt that the pts speech and ability to control secretions would return in several months but L-sided plegia may not recover. Dr. [name] had discussion with patient and family multiple times discussing prognosis and trajectory and the patient and family both decided to be continue aggressive medical care and so it was decided to proceed with trach. He was trached on 2/4 which he tolerated and was able to maintain trach  collar after procedure. Discussions took place with surgery and radiology about PEG placement and there was concern that where the LVAD is placed that it was too high risk for infection. [facility] was called and they reported usually in these situations they place J-tube for these patients so the procedure was held at this time for the patient to get stronger and since he already had a Dop-hoff for tube feeds.    Despite aggressive therapy with heparin, coumadin, ASA and Plavix, his LDH remained persistently elevated in the 950 range (was about 1750 on admit). His hemoglobin remained stable and there was no evidence of pump dysfunction. This was discussed with Drs. Milano and [employer] at [facility] and the feeling was that he may need pump exchange at some point, however the risks of immediate pump replacement was felt to outweigh the benefit in the setting of severe RV failure and recent NSTEMI/CVA.    PT/OT and speech worked with the patient extensively and he made huge improvement in his ability to move his L side. He is currently able to walk and move L leg and L arm. It was suggested that the patient could benefit from CIR with extensive therapy and he was evaluated for qualification. His LVAD has been stable and is back on Coumadin, Plavix and ASA. His MAPs are stable and no longer is on any vasoactive gtts (weaned off ~7-10 days ago).     On the day before discharge developed urinary retention with a probably clot on his Foley. His catheter was removed and he was able to void spontaneously, He will be followed closely.    Discharge Weight Range: 186 Discharge Vitals: Blood pressure 91/77, pulse 98, temperature 98.2 F (36.8 C), temperature source Oral, resp. rate 18, height 5\' 4"  (1.626 m), weight 84.8 kg (186 lb 15.2 oz), SpO2 98.00%.  Labs: Lab Results  Component Value Date   WBC 6.1 08/10/2013   HGB 8.8* 08/10/2013   HCT 27.8* 08/10/2013   MCV 92.1 08/10/2013   PLT 343 08/10/2013     Recent Labs Lab 08/10/13 0500  NA 143  K 3.7  CL 105  CO2 28  BUN 19  CREATININE 0.89  CALCIUM 9.0  GLUCOSE 116*   Lab Results  Component Value Date   CHOL 166 07/20/2013   HDL 63 07/20/2013   LDLCALC 84 07/20/2013   TRIG 96 07/20/2013   BNP (last 3 results)  Recent Labs  09/14/12 1123 07/17/13 0923  PROBNP 485.0* 2538.0*    Diagnostic Studies/Procedures   No results found.  Discharge Medications      Medication List    STOP taking these medications       aspirin EC 81 MG tablet  Replaced by:  aspirin 81 MG chewable tablet     carvedilol 25 MG tablet  Commonly known as:  COREG     gabapentin 300 MG capsule  Commonly known as:  NEURONTIN     hydrALAZINE 100 MG tablet  Commonly known as:  APRESOLINE  Replaced by:  hydrALAZINE 20 MG/ML injection     losartan 100 MG tablet  Commonly known as:  COZAAR     magnesium oxide 400 MG tablet  Commonly known as:  MAG-OX     potassium chloride 10 MEQ tablet  Commonly known as:  K-DUR,KLOR-CON     ranitidine 150 MG tablet  Commonly known as:  ZANTAC     torsemide 20 MG tablet  Commonly known as:  DEMADEX  TAKE these medications       acetaminophen 325 MG tablet  Commonly known as:  TYLENOL  Take 2 tablets (650 mg total) by mouth every 4 (four) hours as needed for headache or mild pain.     allopurinol 100 MG tablet  Commonly known as:  ZYLOPRIM  Take 100 mg by mouth as needed. For gout     amLODipine 10 MG tablet  Commonly known as:  NORVASC  Take 10 mg by mouth daily.     antiseptic oral rinse Liqd  15 mLs by Mouth Rinse route QID.     aspirin 81 MG chewable tablet  Chew 1 tablet (81 mg total) by mouth daily.     chlorhexidine 0.12 % solution  Commonly known as:  PERIDEX  15 mLs by Mouth Rinse route 2 (two) times daily.     citalopram 10 MG tablet  Commonly known as:  CELEXA  Take 1 tablet (10 mg total) by mouth daily.     clopidogrel 75 MG tablet  Commonly known as:  PLAVIX  Take 1 tablet (75 mg total) by mouth daily with breakfast.     feeding supplement (OSMOLITE 1.5 CAL) Liqd  Place 1,000 mLs into feeding tube continuous.     feeding supplement (PRO-STAT SUGAR FREE 64) Liqd  Place 30 mLs into feeding tube 4 (four) times daily.     furosemide 40 MG tablet  Commonly known as:  LASIX  Take 1 tablet (40 mg total) by mouth daily.     glycopyrrolate 1 MG tablet  Commonly known as:  ROBINUL   Place 1 tablet (1 mg total) into feeding tube 2 (two) times daily.     hydrALAZINE 20 MG/ML injection  Commonly known as:  APRESOLINE  Inject 0.5 mLs (10 mg total) into the vein every 6 (six) hours as needed (HTN MAP >110).     magnesium oxide 400 (241.3 MG) MG tablet  Commonly known as:  MAG-OX  Take 1 tablet (400 mg total) by mouth daily.     ondansetron 4 MG/2ML Soln injection  Commonly known as:  ZOFRAN  Inject 2 mLs (4 mg total) into the vein every 6 (six) hours as needed for nausea.     pantoprazole sodium 40 mg/20 mL Pack  Commonly known as:  PROTONIX  Place 20 mLs (40 mg total) into feeding tube daily at 12 noon.     spironolactone 25 MG tablet  Commonly known as:  ALDACTONE  Take 1 tablet (25 mg total) by mouth daily.     tamsulosin 0.4 MG Caps capsule  Commonly known as:  FLOMAX  Take 1 capsule (0.4 mg total) by mouth daily.     warfarin 5 MG tablet  Commonly known as:  COUMADIN  Take 5 mg by mouth daily.     warfarin 7.5 MG tablet  Commonly known as:  COUMADIN  Take 1 tablet (7.5 mg total) by mouth daily at 6 PM.        Disposition   The patient will be discharged in stable condition to home. Discharge Orders   Future Orders Complete By Expires   Contraindication beta blocker-discharge  As directed    Contraindication to ARB at discharge  As directed    Diet - low sodium heart healthy  As directed    Heart Failure patients record your daily weight using the same scale at the same time of day  As directed    Increase activity slowly  As directed  STOP any activity that causes chest pain, shortness of breath, dizziness, sweating, or exessive weakness  As directed      Follow-up Information   Follow up with Gates Rigg, MD. Schedule an appointment as soon as possible for a visit in 2 months. (stroke clinic)    Specialties:  Neurology, Radiology   Contact information:   [address] [place] [address] [phone]          Duration of Discharge Encounter: Greater than 35 minutes   Migdalia Dk  08/11/2013, 8:58 AM   Patient seen and examined with [name], NP. We discussed all aspects of the encounter. I agree with the assessment and plan as stated above. Ready for d/c to CIR today. The HF/VAD team will follow closely.   Shivaun Bilello,MD 8:58 AM

## 2013-08-10 NOTE — Progress Notes (Signed)
Respiratory therapy note-Patient suctioned for large amount pale yellow secretions. PT working with patient while up in chair. MD paged for assistance with trach change, no call back at this time, RN will call again. Patient is a very difficult intubation assistance will be needed.

## 2013-08-10 NOTE — Progress Notes (Signed)
Physical Therapy Treatment Patient Details Name: [name] MRN: [id number] DOB: [birth year] Today's Date: 08/10/2013 Time: 1833-5825 PT Time Calculation (min): 26 min  PT Assessment / Plan / Recommendation  History of Present Illness [name] is 53 y/o male with a history of severe CHF due to NICM EF 10-15% s/p HM II LVAD placed at [facility] March 2013. His medical history also consists of CRI, NSVT s/p ICD and LV thrombus. Admitted 1/24 with NSTEMI felt secondary due embolic phenomenon due to pump thrombosis. 1/26 developed acute onset of aphasia and left-sided hemiparesis due to acute right MCA embolic CVA. Extubated 1/30. Reintubated 2/2 due to inability control secretions. Trach 2/4    PT Comments   Needed more encouragement today.  Mildly more unsteady in standing and during gait with more ataxia and need for more stability on the L side.   Follow Up Recommendations  CIR     Does the patient have the potential to tolerate intense rehabilitation     Barriers to Discharge        Equipment Recommendations  Other (comment) (TBA)    Recommendations for Other Services Rehab consult  Frequency Min 4X/week   Progress towards PT Goals Progress towards PT goals: Progressing toward goals  Plan Current plan remains appropriate    Precautions / Restrictions Precautions Precautions: Fall;Other (comment) (LVAD) Precaution Comments: LVAD   Pertinent Vitals/Pain 98-100% on RA during ambulation via trach    Mobility  Transfers Overall transfer level: Needs assistance Equipment used: 2 person hand held assist Transfers: Sit to/from Stand Sit to Stand: Min assist;+2 physical assistance General transfer comment: Pt able to advance Rt. LE today but with some buckling of Lt. LE and more ataxia Ambulation/Gait Ambulation/Gait assistance: Mod assist;+2 physical assistance Ambulation Distance (Feet): 12 Feet (forward and back times 1) Assistive device: 2 person hand held assist Gait  Pattern/deviations: Step-to pattern;Step-through pattern;Decreased step length - right;Ataxic General Gait Details: Pt requires physical assist for control of w/shift and upper trunk, control/assist for LE ataxia.  Pt was more unsteady today with less ability to contol knee stability on L LE.  However, decent heel/toe pattern bil. Modified Rankin (Stroke Patients Only) Pre-Morbid Rankin Score: No symptoms Modified Rankin: Severe disability    Exercises     PT Diagnosis:    PT Problem List:   PT Treatment Interventions:     PT Goals (current goals can now be found in the care plan section) Acute Rehab PT Goals PT Goal Formulation: With patient Time For Goal Achievement: 08/13/13 Potential to Achieve Goals: Good  Visit Information  Last PT Received On: 08/10/13 Assistance Needed: +2 History of Present Illness: [name] is 53 y/o male with a history of severe CHF due to NICM EF 10-15% s/p HM II LVAD placed at [facility] March 2013. His medical history also consists of CRI, NSVT s/p ICD and LV thrombus. Admitted 1/24 with NSTEMI felt secondary due embolic phenomenon due to pump thrombosis. 1/26 developed acute onset of aphasia and left-sided hemiparesis due to acute right MCA embolic CVA. Extubated 1/30. Reintubated 2/2 due to inability control secretions. Trach 2/4     Subjective Data  Subjective: pt not as energetic, pointing at his stomach   Cognition  Cognition Arousal/Alertness: Awake/alert Behavior During Therapy: Flat affect Overall Cognitive Status: Within Functional Limits for tasks assessed Difficult to assess due to: Tracheostomy    Balance  Balance Overall balance assessment: Needs assistance Sitting balance-Leahy Scale: Good Standing balance support: Single extremity supported Standing balance-Leahy Scale: Fair Standing  balance comment: could maintain midline steadily with 1 limb support.  Became mildly more unsteady during w/shift with only  1 UE support.  End of Session PT -  End of Session Equipment Utilized During Treatment: Oxygen (sats at 98-100% on RA during ambulation) Activity Tolerance: Patient tolerated treatment well Patient left: in chair;with call bell/phone within reach Nurse Communication: Mobility status   GP     [name], Eric GumKenneth [name] 08/10/2013, 1:37 PM 08/10/2013  [name], PT [phone]  (pager)

## 2013-08-11 ENCOUNTER — Inpatient Hospital Stay (HOSPITAL_COMMUNITY)
Admission: RE | Admit: 2013-08-11 | Discharge: 2013-08-23 | DRG: 945 | Disposition: A | Discharge status: Other Acute Inpatient Hospital | Payer: Medicare Other | Source: Intra-hospital | Attending: Physical Medicine & Rehabilitation | Admitting: Physical Medicine & Rehabilitation

## 2013-08-11 ENCOUNTER — Encounter (HOSPITAL_COMMUNITY): Payer: Self-pay | Admitting: *Deleted

## 2013-08-11 DIAGNOSIS — Z95811 Presence of heart assist device: Secondary | ICD-10-CM

## 2013-08-11 DIAGNOSIS — R4701 Aphasia: Secondary | ICD-10-CM | POA: Diagnosis present

## 2013-08-11 DIAGNOSIS — N189 Chronic kidney disease, unspecified: Secondary | ICD-10-CM | POA: Diagnosis present

## 2013-08-11 DIAGNOSIS — I69959 Hemiplegia and hemiparesis following unspecified cerebrovascular disease affecting unspecified side: Secondary | ICD-10-CM

## 2013-08-11 DIAGNOSIS — R05 Cough: Secondary | ICD-10-CM | POA: Diagnosis present

## 2013-08-11 DIAGNOSIS — I509 Heart failure, unspecified: Secondary | ICD-10-CM

## 2013-08-11 DIAGNOSIS — I69991 Dysphagia following unspecified cerebrovascular disease: Secondary | ICD-10-CM

## 2013-08-11 DIAGNOSIS — R1314 Dysphagia, pharyngoesophageal phase: Secondary | ICD-10-CM | POA: Diagnosis present

## 2013-08-11 DIAGNOSIS — B370 Candidal stomatitis: Secondary | ICD-10-CM | POA: Diagnosis present

## 2013-08-11 DIAGNOSIS — Z95810 Presence of automatic (implantable) cardiac defibrillator: Secondary | ICD-10-CM

## 2013-08-11 DIAGNOSIS — R482 Apraxia: Secondary | ICD-10-CM

## 2013-08-11 DIAGNOSIS — R339 Retention of urine, unspecified: Secondary | ICD-10-CM | POA: Diagnosis present

## 2013-08-11 DIAGNOSIS — Z930 Tracheostomy status: Secondary | ICD-10-CM

## 2013-08-11 DIAGNOSIS — I129 Hypertensive chronic kidney disease with stage 1 through stage 4 chronic kidney disease, or unspecified chronic kidney disease: Secondary | ICD-10-CM | POA: Diagnosis present

## 2013-08-11 DIAGNOSIS — I6340 Cerebral infarction due to embolism of unspecified cerebral artery: Secondary | ICD-10-CM

## 2013-08-11 DIAGNOSIS — R319 Hematuria, unspecified: Secondary | ICD-10-CM | POA: Diagnosis present

## 2013-08-11 DIAGNOSIS — R488 Other symbolic dysfunctions: Secondary | ICD-10-CM | POA: Diagnosis present

## 2013-08-11 DIAGNOSIS — Z5189 Encounter for other specified aftercare: Principal | ICD-10-CM

## 2013-08-11 DIAGNOSIS — J9600 Acute respiratory failure, unspecified whether with hypoxia or hypercapnia: Secondary | ICD-10-CM

## 2013-08-11 DIAGNOSIS — I5022 Chronic systolic (congestive) heart failure: Secondary | ICD-10-CM | POA: Diagnosis present

## 2013-08-11 DIAGNOSIS — K219 Gastro-esophageal reflux disease without esophagitis: Secondary | ICD-10-CM | POA: Diagnosis present

## 2013-08-11 DIAGNOSIS — IMO0002 Reserved for concepts with insufficient information to code with codable children: Secondary | ICD-10-CM

## 2013-08-11 DIAGNOSIS — I214 Non-ST elevation (NSTEMI) myocardial infarction: Secondary | ICD-10-CM

## 2013-08-11 DIAGNOSIS — I428 Other cardiomyopathies: Secondary | ICD-10-CM

## 2013-08-11 DIAGNOSIS — Z9119 Patient's noncompliance with other medical treatment and regimen: Secondary | ICD-10-CM

## 2013-08-11 DIAGNOSIS — G8190 Hemiplegia, unspecified affecting unspecified side: Secondary | ICD-10-CM | POA: Diagnosis present

## 2013-08-11 DIAGNOSIS — I472 Ventricular tachycardia: Secondary | ICD-10-CM

## 2013-08-11 DIAGNOSIS — Z91199 Patient's noncompliance with other medical treatment and regimen due to unspecified reason: Secondary | ICD-10-CM

## 2013-08-11 DIAGNOSIS — R7402 Elevation of levels of lactic acid dehydrogenase (LDH): Secondary | ICD-10-CM | POA: Diagnosis present

## 2013-08-11 DIAGNOSIS — Z87891 Personal history of nicotine dependence: Secondary | ICD-10-CM

## 2013-08-11 DIAGNOSIS — F32A Depression, unspecified: Secondary | ICD-10-CM

## 2013-08-11 DIAGNOSIS — I8290 Acute embolism and thrombosis of unspecified vein: Secondary | ICD-10-CM

## 2013-08-11 DIAGNOSIS — R059 Cough, unspecified: Secondary | ICD-10-CM | POA: Diagnosis present

## 2013-08-11 DIAGNOSIS — I513 Intracardiac thrombosis, not elsewhere classified: Secondary | ICD-10-CM | POA: Diagnosis present

## 2013-08-11 DIAGNOSIS — I5023 Acute on chronic systolic (congestive) heart failure: Secondary | ICD-10-CM

## 2013-08-11 DIAGNOSIS — I639 Cerebral infarction, unspecified: Secondary | ICD-10-CM | POA: Diagnosis present

## 2013-08-11 DIAGNOSIS — R740 Nonspecific elevation of levels of transaminase and lactic acid dehydrogenase [LDH]: Secondary | ICD-10-CM

## 2013-08-11 DIAGNOSIS — D649 Anemia, unspecified: Secondary | ICD-10-CM | POA: Diagnosis not present

## 2013-08-11 DIAGNOSIS — R1312 Dysphagia, oropharyngeal phase: Secondary | ICD-10-CM

## 2013-08-11 DIAGNOSIS — I4729 Other ventricular tachycardia: Secondary | ICD-10-CM

## 2013-08-11 DIAGNOSIS — I69920 Aphasia following unspecified cerebrovascular disease: Secondary | ICD-10-CM

## 2013-08-11 HISTORY — DX: Depression, unspecified: F32.A

## 2013-08-11 LAB — GLUCOSE, CAPILLARY: Glucose-Capillary: 106 mg/dL — ABNORMAL HIGH (ref 70–99)

## 2013-08-11 LAB — PROTIME-INR
INR: 2.74 — AB (ref 0.00–1.49)
PROTHROMBIN TIME: 28.1 s — AB (ref 11.6–15.2)

## 2013-08-11 MED ORDER — WARFARIN - PHYSICIAN DOSING INPATIENT: Freq: Every day | Status: DC

## 2013-08-11 MED ORDER — TAMSULOSIN HCL 0.4 MG PO CAPS
0.4000 mg | ORAL_CAPSULE | Freq: Every day | ORAL | Status: DC
  Administered 2013-08-11: 0.4 mg via ORAL
  Filled 2013-08-11: qty 1

## 2013-08-11 MED ORDER — BISACODYL 10 MG RE SUPP
10.0000 mg | Freq: Every day | RECTAL | Status: DC | PRN
  Filled 2013-08-11: qty 1

## 2013-08-11 MED ORDER — FUROSEMIDE 40 MG PO TABS
40.0000 mg | ORAL_TABLET | Freq: Every day | ORAL | Status: DC
  Filled 2013-08-11 (×2): qty 1

## 2013-08-11 MED ORDER — GLYCOPYRROLATE 1 MG PO TABS: 1.0000 mg | ORAL_TABLET | Freq: Two times a day (BID) | ORAL | Status: DC

## 2013-08-11 MED ORDER — SPIRONOLACTONE 25 MG PO TABS: 25.0000 mg | ORAL_TABLET | Freq: Every day | ORAL | Status: DC

## 2013-08-11 MED ORDER — CHLORHEXIDINE GLUCONATE 0.12 % MT SOLN
15.0000 mL | Freq: Two times a day (BID) | OROMUCOSAL | Status: DC
  Administered 2013-08-11 – 2013-08-22 (×22): 15 mL via OROMUCOSAL
  Filled 2013-08-11 (×26): qty 15

## 2013-08-11 MED ORDER — LOPERAMIDE HCL 1 MG/5ML PO LIQD
2.0000 mg | Freq: Two times a day (BID) | ORAL | Status: DC | PRN
  Filled 2013-08-11: qty 10

## 2013-08-11 MED ORDER — PANCRELIPASE (LIP-PROT-AMYL) 12000-38000 UNITS PO CPEP
2.0000 | ORAL_CAPSULE | Freq: Once | ORAL | Status: AC
  Administered 2013-08-11: 2 via ORAL
  Filled 2013-08-11: qty 2

## 2013-08-11 MED ORDER — LIDOCAINE HCL 2 % EX GEL
CUTANEOUS | Status: DC | PRN
  Filled 2013-08-11: qty 5

## 2013-08-11 MED ORDER — CLOPIDOGREL BISULFATE 75 MG PO TABS: 75.0000 mg | ORAL_TABLET | Freq: Every day | ORAL | Status: DC

## 2013-08-11 MED ORDER — BIOTENE DRY MOUTH MT LIQD
15.0000 mL | Freq: Four times a day (QID) | OROMUCOSAL | Status: DC
  Administered 2013-08-11 – 2013-08-23 (×38): 15 mL via OROMUCOSAL

## 2013-08-11 MED ORDER — HYDRALAZINE HCL 20 MG/ML IJ SOLN
10.0000 mg | Freq: Four times a day (QID) | INTRAMUSCULAR | Status: DC | PRN
  Filled 2013-08-11: qty 0.5

## 2013-08-11 MED ORDER — GUAIFENESIN-DM 100-10 MG/5ML PO SYRP
5.0000 mL | ORAL_SOLUTION | Freq: Four times a day (QID) | ORAL | Status: DC | PRN
  Administered 2013-08-15 – 2013-08-22 (×4): 10 mL
  Filled 2013-08-11 (×4): qty 10

## 2013-08-11 MED ORDER — ALLOPURINOL 100 MG PO TABS
100.0000 mg | ORAL_TABLET | Freq: Every day | ORAL | Status: DC
  Administered 2013-08-13 – 2013-08-23 (×11): 100 mg via ORAL
  Filled 2013-08-11 (×13): qty 1

## 2013-08-11 MED ORDER — CHLORHEXIDINE GLUCONATE 0.12 % MT SOLN: 15.0000 mL | Freq: Two times a day (BID) | OROMUCOSAL | Status: DC

## 2013-08-11 MED ORDER — TAMSULOSIN HCL 0.4 MG PO CAPS: 0.4000 mg | ORAL_CAPSULE | Freq: Every day | ORAL | Status: DC

## 2013-08-11 MED ORDER — ACETAMINOPHEN 325 MG PO TABS: 650.0000 mg | ORAL_TABLET | ORAL | Status: DC | PRN

## 2013-08-11 MED ORDER — NITROGLYCERIN 0.4 MG SL SUBL: 0.4000 mg | SUBLINGUAL_TABLET | SUBLINGUAL | Status: DC | PRN

## 2013-08-11 MED ORDER — TAMSULOSIN HCL 0.4 MG PO CAPS
0.4000 mg | ORAL_CAPSULE | Freq: Every day | ORAL | Status: DC
  Administered 2013-08-11: 0.4 mg via ORAL
  Filled 2013-08-11 (×3): qty 1

## 2013-08-11 MED ORDER — FLEET ENEMA 7-19 GM/118ML RE ENEM: 1.0000 | ENEMA | Freq: Once | RECTAL | Status: AC | PRN

## 2013-08-11 MED ORDER — ASPIRIN 81 MG PO CHEW
81.0000 mg | CHEWABLE_TABLET | Freq: Every day | ORAL | Status: DC
  Administered 2013-08-13 – 2013-08-23 (×11): 81 mg via NASOGASTRIC
  Filled 2013-08-11 (×12): qty 1

## 2013-08-11 MED ORDER — PANTOPRAZOLE SODIUM 40 MG PO PACK
40.0000 mg | PACK | Freq: Every day | ORAL | Status: DC
  Administered 2013-08-12 – 2013-08-22 (×11): 40 mg
  Filled 2013-08-11 (×12): qty 20

## 2013-08-11 MED ORDER — ONDANSETRON HCL 4 MG/2ML IJ SOLN: 4.0000 mg | Freq: Four times a day (QID) | INTRAMUSCULAR | Status: DC | PRN

## 2013-08-11 MED ORDER — SODIUM CHLORIDE 0.9 % IJ SOLN
10.0000 mL | Freq: Two times a day (BID) | INTRAMUSCULAR | Status: DC
  Administered 2013-08-12 – 2013-08-13 (×2): 30 mL
  Administered 2013-08-21 – 2013-08-22 (×2): 10 mL

## 2013-08-11 MED ORDER — CITALOPRAM HYDROBROMIDE 10 MG PO TABS
10.0000 mg | ORAL_TABLET | Freq: Every day | ORAL | Status: DC
  Administered 2013-08-12 – 2013-08-23 (×12): 10 mg
  Filled 2013-08-11 (×13): qty 1

## 2013-08-11 MED ORDER — HYDRALAZINE HCL 20 MG/ML IJ SOLN: 10.0000 mg | Freq: Four times a day (QID) | INTRAMUSCULAR | Status: DC | PRN

## 2013-08-11 MED ORDER — PRO-STAT SUGAR FREE PO LIQD
30.0000 mL | Freq: Four times a day (QID) | ORAL | Status: DC
  Administered 2013-08-11 – 2013-08-17 (×20): 30 mL
  Filled 2013-08-11 (×27): qty 30

## 2013-08-11 MED ORDER — MAGNESIUM OXIDE 400 (241.3 MG) MG PO TABS: 400.0000 mg | ORAL_TABLET | Freq: Every day | ORAL | Status: DC

## 2013-08-11 MED ORDER — WARFARIN - PHARMACIST DOSING INPATIENT
Freq: Every day | Status: DC
  Administered 2013-08-14 – 2013-08-15 (×2)

## 2013-08-11 MED ORDER — ASPIRIN 81 MG PO CHEW: 81.0000 mg | CHEWABLE_TABLET | Freq: Every day | ORAL | Status: DC

## 2013-08-11 MED ORDER — SODIUM BICARBONATE 650 MG PO TABS
650.0000 mg | ORAL_TABLET | Freq: Once | ORAL | Status: AC
  Administered 2013-08-11: 650 mg via ORAL
  Filled 2013-08-11: qty 1

## 2013-08-11 MED ORDER — OSMOLITE 1.5 CAL PO LIQD
1000.0000 mL | ORAL | Status: DC
  Administered 2013-08-12: 1000 mL
  Filled 2013-08-11 (×2): qty 1000

## 2013-08-11 MED ORDER — SPIRONOLACTONE 25 MG PO TABS
25.0000 mg | ORAL_TABLET | Freq: Every day | ORAL | Status: DC
  Administered 2013-08-13 – 2013-08-23 (×11): 25 mg
  Filled 2013-08-11 (×13): qty 1

## 2013-08-11 MED ORDER — BIOTENE DRY MOUTH MT LIQD: 15.0000 mL | Freq: Four times a day (QID) | OROMUCOSAL | Status: DC

## 2013-08-11 MED ORDER — PANTOPRAZOLE SODIUM 40 MG PO PACK: 40.0000 mg | PACK | Freq: Every day | ORAL | Status: DC

## 2013-08-11 MED ORDER — CITALOPRAM HYDROBROMIDE 10 MG PO TABS: 10.0000 mg | ORAL_TABLET | Freq: Every day | ORAL | Status: DC

## 2013-08-11 MED ORDER — WARFARIN SODIUM 7.5 MG PO TABS
7.5000 mg | ORAL_TABLET | Freq: Every day | ORAL | Status: DC
  Administered 2013-08-11: 7.5 mg
  Filled 2013-08-11 (×2): qty 1

## 2013-08-11 MED ORDER — WARFARIN SODIUM 7.5 MG PO TABS: 7.5000 mg | ORAL_TABLET | Freq: Every day | ORAL | Status: DC

## 2013-08-11 MED ORDER — FUROSEMIDE 40 MG PO TABS: 40.0000 mg | ORAL_TABLET | Freq: Every day | ORAL | Status: DC

## 2013-08-11 MED ORDER — ACETAMINOPHEN 325 MG PO TABS
650.0000 mg | ORAL_TABLET | ORAL | Status: DC | PRN
  Filled 2013-08-11: qty 2

## 2013-08-11 MED ORDER — SODIUM CHLORIDE 0.9 % IV SOLN
INTRAVENOUS | Status: DC
  Administered 2013-08-12: 20 mL/h via INTRAVENOUS

## 2013-08-11 MED ORDER — CLOPIDOGREL BISULFATE 75 MG PO TABS
75.0000 mg | ORAL_TABLET | Freq: Every day | ORAL | Status: DC
  Administered 2013-08-12 – 2013-08-19 (×8): 75 mg
  Filled 2013-08-11 (×9): qty 1

## 2013-08-11 MED ORDER — GLYCOPYRROLATE 1 MG PO TABS
1.0000 mg | ORAL_TABLET | Freq: Two times a day (BID) | ORAL | Status: DC
  Administered 2013-08-11 – 2013-08-16 (×9): 1 mg
  Filled 2013-08-11 (×16): qty 1

## 2013-08-11 MED ORDER — MAGNESIUM OXIDE 400 (241.3 MG) MG PO TABS
400.0000 mg | ORAL_TABLET | Freq: Every day | ORAL | Status: DC
  Administered 2013-08-12 – 2013-08-23 (×12): 400 mg
  Filled 2013-08-11 (×14): qty 1

## 2013-08-11 MED ORDER — OSMOLITE 1.5 CAL PO LIQD: 1000.0000 mL | ORAL | Status: DC

## 2013-08-11 MED ORDER — PRO-STAT SUGAR FREE PO LIQD: 30.0000 mL | Freq: Four times a day (QID) | ORAL | Status: DC

## 2013-08-11 NOTE — Progress Notes (Signed)
Rehab admissions - Evaluated for possible admission.  I spoke with patient and girlfriend, [name], at the bedside.  Both are agreeable to inpatient rehab admission.  Will plan to admit to acute inpatient rehab today.  Call me for questions.  #[phone]

## 2013-08-11 NOTE — H&P (View-Only) (Signed)
Physical Medicine and Rehabilitation Admission H&P    Chief Complaint  Patient presents with  . Left hemiparesis, aphasia, apraxia, dysphagia, trach dependent, LVAD thrombosis.   HPI:  [name] is a 53 y.o. 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 He is s/p LVAD placement at [facility] March 2013, as bridge to transplant. He was admitted for evaluation of new NSTEMI felt possibly to be related to pump thrombosis. Hosp course complicated by episodes of CP and hypotension on 1/24 pm hours requiring pressors. He . 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 Aspiration PNA treated with zosyn. TEE 1/28 without evidence of clot but with RV failure   Extubated on 07/23/13 but has difficulty handling secretions and reintubated on 07/25/13. He developed oropharyngeal bleeding and ENT evaluations did not reveal source of acute bleeding. Trach placed by Dr. Harland Dingwall on 07/28/13 and patient tolerating trach collar. He is NPO due to sever dysphagia but deemed too high risk for PEG placement by IR and CCS. Is tolerating panda tube feeds without difficulty. Trach downsized to CFS # 6 yesterday and PMSV trials attempted.  Patient able to cough tracheally.   He developed hematuria with urinary retention and foley placed on 08/07/13. Has had complaints of bladder pain and foley d/c today. 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 Therapies ongoing and patient showing improvement in left hemiparesis. He continues with ideomotor/ oromotor apraxia, severe oropharyngeal dysphagia as well as aphasia. Rehab team recommending CIR and patient admitted today.      Review of Systems  Unable to perform ROS: language   Past Medical History  Diagnosis Date  . CHF (congestive heart failure)     EF- 10-15  . Medically noncompliant   . Mitral  regurgitation   . Tobacco user   . HTN (hypertension)   . AICD (automatic cardioverter/defibrillator) present   . GERD (gastroesophageal reflux disease)   . Substance abuse   . Chronic renal insufficiency   . Syncope   . Thrombus 08/06/2010  . SYSTOLIC HEART FAILURE, CHRONIC 09/22/2008    Qualifier: Diagnosis of  By: Haroldine Laws, MD, Eileen Stanford   . LV (left ventricular) mural thrombus 01/28/2011  . ICD - IN SITU 09/16/2008    Qualifier: Diagnosis of  By: Peri Maris    . MITRAL STENOSIS/ INSUFFICIENCY, NON-RHEUMATIC 09/22/2008    Qualifier: Diagnosis of  By: Haroldine Laws, MD, Eileen Stanford Hepatomegaly 09/16/2008    Qualifier: Diagnosis of  By: Peri Maris    . High cholesterol 02/26/2012    "at one time"  . Sleep apnea   . Exertional dyspnea 02/26/2012  . History of blood transfusion 08/2011    "when I had heart pump"  . Migraines   . COMMON MIGRAINE 06/14/2009    Qualifier: Diagnosis of  By: Jenny Reichmann MD, Hunt Oris   . History of gout 02/26/2012  . Depression   . Bipolar affective disorder 10/22/2011    pt denies this hx 02/26/2012   Past Surgical History  Procedure Laterality Date  . Cardiac defibrillator placement  ~ 2008  . Left ventricular assist device  08/2011  . Radiology with anesthesia N/A 07/19/2013    Procedure: RADIOLOGY WITH ANESTHESIA;  Surgeon: Rob Hickman, MD;  Location: [name];  Service: Radiology;  Laterality: N/A;  . Tracheostomy  feinstein   Family History  Problem Relation Age of Onset  . Coronary artery disease Neg Hx    Social History: Single but has a supportive girlfriend and friends. Used to work as a Curator till 2 years ago. Per reports that he quit smoking about 2 years ago. His smoking use included Cigarettes. He has a 8.25 pack-year smoking history. He quit smokeless tobacco use about 2 years ago. Per reports that he does not drink alcohol or use illicit drugs.      Allergies  Allergen Reactions  . Ace Inhibitors Cough  .  Lexapro [Escitalopram Oxalate] Other (See Comments)    somnolence   Medications Prior to Admission  Medication Sig Dispense Refill  . amLODipine (NORVASC) 10 MG tablet Take 10 mg by mouth daily.      Marland Kitchen warfarin (COUMADIN) 5 MG tablet Take 5 mg by mouth daily.       . [DISCONTINUED] aspirin EC 81 MG tablet Take 81 mg by mouth daily.      . [DISCONTINUED] carvedilol (COREG) 25 MG tablet Take 50 mg by mouth 2 (two) times daily with a meal.      . [DISCONTINUED] clopidogrel (PLAVIX) 75 MG tablet Take 1 tablet (75 mg total) by mouth daily with breakfast.  30 tablet  4  . [DISCONTINUED] gabapentin (NEURONTIN) 300 MG capsule Take 300 mg by mouth 3 (three) times daily.      . [DISCONTINUED] hydrALAZINE (APRESOLINE) 100 MG tablet Take 100 mg by mouth 3 (three) times daily.      . [DISCONTINUED] losartan (COZAAR) 100 MG tablet Take 100 mg by mouth daily.      . [DISCONTINUED] magnesium oxide (MAG-OX) 400 MG tablet Take 400 mg by mouth daily.      . [DISCONTINUED] potassium chloride (K-DUR,KLOR-CON) 10 MEQ tablet Take 20 mEq by mouth daily.      . [DISCONTINUED] ranitidine (ZANTAC) 150 MG tablet Take 150 mg by mouth 2 (two) times daily.       . [DISCONTINUED] torsemide (DEMADEX) 20 MG tablet Take 2 tablets (40 mg total) by mouth daily.  30 tablet  3  . allopurinol (ZYLOPRIM) 100 MG tablet Take 100 mg by mouth as needed. For gout        Home: Home Living Family/patient expects to be discharged to:: Unsure Living Arrangements: Other relatives   Functional History:    Functional Status:  Mobility:     Ambulation/Gait Ambulation Distance (Feet): 12 Feet (forward and back times 1) General Gait Details: Pt requires physical assist for control of w/shift and upper trunk, control/assist for LE ataxia.  Pt was more unsteady today with less ability to contol knee stability on L LE.  However, decent heel/toe pattern bil.    ADL: ADL Eating/Feeding: NPO Grooming: Teeth care;Supervision/safety;Set  up;Wash/dry face Where Assessed - Grooming: Supported sitting Upper Body Bathing: Minimal assistance Where Assessed - Upper Body Bathing: Supported sitting Lower Body Bathing: Moderate assistance Where Assessed - Lower Body Bathing: Supported sit to stand Upper Body Dressing: Moderate assistance Where Assessed - Upper Body Dressing: Unsupported sitting;Supported sitting Lower Body Dressing: +1 Total assistance Where Assessed - Lower Body Dressing: Supported sit to stand Toilet Transfer: Minimal assistance Toilet Transfer Method: Sit to stand;Stand pivot Toilet Transfer Equipment: Bedside commode Transfers/Ambulation Related to ADLs: Min A for transfers; min A +2 for ambulation  ADL Comments: Treatment focused on facilitation of reach with Lt. UE.  Pt now able to reach for retrieve and give therapist a lotion bottle  at ~45* shoulder flexion with min - mod facilitation (variable performance).  Ideomotor apraxia remains.  Discussed with pt need to attempt to use and move Lt. UE throughout the day and instructed him on movements and activities for him to perform.  he was able to return demonstration   Cognition: Cognition Overall Cognitive Status: Within Functional Limits for tasks assessed (No obvious cognitive deficits noted) Orientation Level: Other (comment) (Tracheostomy-unable to assess) Cognition Arousal/Alertness: Awake/alert Behavior During Therapy: WFL for tasks assessed/performed Overall Cognitive Status: Within Functional Limits for tasks assessed (No obvious cognitive deficits noted) Difficult to assess due to: Tracheostomy  Physical Exam: Blood pressure 91/77, pulse 98, temperature 98.2 F (36.8 C), temperature source Oral, resp. rate 18, height 5' 4" (1.626 m), weight 84.8 kg (186 lb 15.2 oz), SpO2 98.00%. Physical Exam  Nursing note and vitals reviewed.  Constitutional: He appears well-developed and well-nourished.  No distress HENT: NGT in place Head:  Atraumatic. Eyes: Conjunctivae are normal. Pupils are equal, round, and reactive to light.  Neck:  #6 Trach in place, cuffless with slight blood tinged/dried secretions surrounding area.   Unable to phonate around Cardiovascular:  +Hum. Heart rate 100-110. LVAD insertion site clean and intact Respiratory: No respiratory distress. He has no wheezes.  GI: Soft. He exhibits mild distension. There is no tenderness. Bowel sounds present Musculoskeletal:  Edema left hand of tr to 1+ Neurological: He is alert.  Smiles and makes good eye contact. able to open mouth to command and protrudes tongue.. Able to follow basic commands. Moves right side without difficulty. Left UE  1/5 with deltoid, bicep, tricep, HI. LE: 1+ to 2- HF, KE and 1 to 1+ at ankle. Senses pain on left.  Left foot with sustained clonus. More alert. Follows simple commands Skin: Skin is warm and dry.    Results for orders placed during the hospital encounter of 07/17/13 (from the past 48 hour(s))  GLUCOSE, CAPILLARY     Status: Abnormal   Collection Time    08/09/13  2:28 PM      Result Value Ref Range   Glucose-Capillary 105 (*) 70 - 99 mg/dL  GLUCOSE, CAPILLARY     Status: Abnormal   Collection Time    08/09/13 10:25 PM      Result Value Ref Range   Glucose-Capillary 110 (*) 70 - 99 mg/dL   Comment 1 Documented in Chart     Comment 2 Notify RN    PROTIME-INR     Status: Abnormal   Collection Time    08/10/13  5:00 AM      Result Value Ref Range   Prothrombin Time 27.1 (*) 11.6 - 15.2 seconds   INR 2.62 (*) 0.00 - 1.49  LACTATE DEHYDROGENASE     Status: Abnormal   Collection Time    08/10/13  5:00 AM      Result Value Ref Range   LDH 958 (*) 94 - 250 U/L  CBC     Status: Abnormal   Collection Time    08/10/13  5:00 AM      Result Value Ref Range   WBC 6.1  4.0 - 10.5 K/uL   RBC 3.02 (*) 4.22 - 5.81 MIL/uL   Hemoglobin 8.8 (*) 13.0 - 17.0 g/dL   HCT 27.8 (*) 39.0 - 52.0 %   MCV 92.1  78.0 - 100.0 fL   MCH  29.1  26.0 - 34.0 pg   MCHC 31.7  30.0 - 36.0 g/dL   RDW 15.7 (*)   11.5 - 15.5 %   Platelets 343  150 - 400 K/uL  BASIC METABOLIC PANEL     Status: Abnormal   Collection Time    08/10/13  5:00 AM      Result Value Ref Range   Sodium 143  137 - 147 mEq/L   Potassium 3.7  3.7 - 5.3 mEq/L   Chloride 105  96 - 112 mEq/L   CO2 28  19 - 32 mEq/L   Glucose, Bld 116 (*) 70 - 99 mg/dL   BUN 19  6 - 23 mg/dL   Creatinine, Ser 0.89  0.50 - 1.35 mg/dL   Calcium 9.0  8.4 - 10.5 mg/dL   GFR calc non Af Amer >90  >90 mL/min   GFR calc Af Amer >90  >90 mL/min   Comment: (NOTE)     The eGFR has been calculated using the CKD EPI equation.     This calculation has not been validated in all clinical situations.     eGFR's persistently <90 mL/min signify possible Chronic Kidney     Disease.  GLUCOSE, CAPILLARY     Status: None   Collection Time    08/10/13  8:49 AM      Result Value Ref Range   Glucose-Capillary 92  70 - 99 mg/dL   Comment 1 Documented in Chart     Comment 2 Notify RN    GLUCOSE, CAPILLARY     Status: None   Collection Time    08/10/13  3:58 PM      Result Value Ref Range   Glucose-Capillary 90  70 - 99 mg/dL   Comment 1 Documented in Chart     Comment 2 Notify RN    GLUCOSE, CAPILLARY     Status: Abnormal   Collection Time    08/10/13 10:00 PM      Result Value Ref Range   Glucose-Capillary 124 (*) 70 - 99 mg/dL  PROTIME-INR     Status: Abnormal   Collection Time    08/11/13  5:35 AM      Result Value Ref Range   Prothrombin Time 28.1 (*) 11.6 - 15.2 seconds   INR 2.74 (*) 0.00 - 1.49  GLUCOSE, CAPILLARY     Status: Abnormal   Collection Time    08/11/13  6:18 AM      Result Value Ref Range   Glucose-Capillary 106 (*) 70 - 99 mg/dL   No results found.  Post Admission Physician Evaluation: 1. Functional deficits secondary  to Embolic bilateral BG CVA's, left thalamus CVA with dense left hemiparesis. 2. Patient is admitted to receive collaborative,  interdisciplinary care between the physiatrist, rehab nursing staff, and therapy team. 3. Patient's level of medical complexity and substantial therapy needs in context of that medical necessity cannot be provided at a lesser intensity of care such as a SNF. 4. Patient has experienced substantial functional loss from his/her baseline which was documented above under the "Functional History" and "Functional Status" headings.  Judging by the patient's diagnosis, physical exam, and functional history, the patient has potential for functional progress which will result in measurable gains while on inpatient rehab.  These gains will be of substantial and practical use upon discharge  in facilitating mobility and self-care at the household level. 5. Physiatrist will provide 24 hour management of medical needs as well as oversight of the therapy plan/treatment and provide guidance as appropriate regarding the interaction of the two. 6. 24 hour rehab nursing will   assist with bladder management, bowel management, safety, skin/wound care, disease management, medication administration, pain management and patient education  and help integrate therapy concepts, techniques,education, etc. 7. PT will assess and treat for/with: Lower extremity strength, range of motion, stamina, balance, functional mobility, safety, adaptive techniques and equipment, NMR, equipment management, education.   Goals are: supervision to min assist. 8. OT will assess and treat for/with: ADL's, functional mobility, safety, upper extremity strength, adaptive techniques and equipment, NMR, equipment mgt, education.   Goals are: min assist. 9. SLP will assess and treat for/with: speech, swallowing, communication.  Goals are: supervision to min assist. 10. Case Management and Social Worker will assess and treat for psychological issues and discharge planning. 11. Team conference will be held weekly to assess progress toward goals and to determine  barriers to discharge. 12. Patient will receive at least 3 hours of therapy per day at least 5 days per week. 13. ELOS: 24-30 days       14. Prognosis:  excellent   Medical Problem List and Plan: Embolic bilateral BG CVA's, left thalamus CVA, CHF  1. LVAD thrombosis/DVT prophylaxis/Anticoagulation: Pharmaceutical: Coumadin 2. Pain Management:  Will monitor for signs of distress. Tylenol prn.  3. Mood:  No signs of distress but question awareness/insight into deficits. Continue celexa. Will have LCSW follow along for evaluation/support as indicated.  4. Neuropsych: This patient is  capable of making decisions on her own behalf. 5. LVAD: Cardiology to follow for management/support. Pt has awareness of management and can help direct. Girlfriend available and learning about mgt as well. 6. Urinary retention/Hematuria: Resolved but now with green tinged urine--elevated LDH noted. Started on flomax. Will check PVRs and set parameters for cath.  7. Severe dysphagia/oral apraxia:  Continue panda tube. Will get dietician to evaluate for nutritional needs. Concerned that current rate of 40 cc/hr not meeting his calorie needs.  8. Trach dependent:  Tolerating CFS #6. Continue to suction frequently--blood tinged secretions continue.   -trials of PMV with SLP   Meredith Staggers, MD, [facility]   08/11/2013

## 2013-08-11 NOTE — Progress Notes (Addendum)
Emptied condom cath drainage bag 475 ml; bladder scanned pt and it showed 500; pt spontaneously voided in urinal 300; notified RN on rehab of this and to closely monitor output; pt still voids in condom cath however may retain some urine.  BARNETT, Geroge Baseman

## 2013-08-11 NOTE — PMR Pre-admission (Signed)
PMR Admission Coordinator Pre-Admission Assessment  Patient: [name] is an 53 y.o., male MRN: [id number] DOB: 07/03/1960 Height: 5\' 4"  (162.6 cm) Weight:  (could not weight pt./bed is malfunctioning)              Insurance Information HMO:  No    PPO:       PCP:       IPA:       80/20:       OTHER:   PRIMARY: Medicare A/B      Policy#: [id number]      Subscriber: [name] CM Name:        Phone#:       Fax#:   Pre-Cert#:        Employer: Not employed/disabled Benefits:  Phone #:       Name: [place] checked Eff. Date: 01/23/04     Deduct: $1260      Out of Pocket Max: none      Life Max: unlimited CIR: 100%      SNF: 100 days Outpatient: 80%     Co-Pay: 20% Home Health: 100%      Co-Pay: none DME: 80%     Co-Pay: 20% Providers: patient's choice  SECONDARY: Medicaid of [place]      Policy#: [id number]      Subscriber: [name] CM Name:        Phone#:       Fax#:   Pre-Cert#:        Employer: Not employed Benefits:  Phone #: [phone]     Name: Automated Eff. Date: 08/11/13    Deduct:        Out of Pocket Max:        Life Max:   CIR:        SNF:   Outpatient:       Co-Pay:   Home Health:        Co-Pay:   DME:       Co-Pay:     Emergency Contact Information Contact Information   Name Relation Home Work Mobile   Michaels,Lucille Mother [phone]  [phone]   Battle,Tamara Significant other   [phone]     Current Medical History  Patient Admitting Diagnosis: Embolic bilateral BG CVA's, left thalamus CVA, CHF     History of Present Illness: A 53 y.o. 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 He is [facility], as bridge to transplant. He was admitted for evaluation of new NSTEMI felt possibly to be related to pump thrombosis. Hosp course complicated by episodes of CP and hypotension on 1/24 pm hours requiring pressors. He . Developed acute onset of left sided weakness with aphasia due to acute right MCA embolic CVA per cerebral  arteriogram. Aspiration PNA treated with zosyn. Extubated on 06/3013 but has difficulty handling secretions and reintubated on 07/25/13. He developed oropharyngeal bleeding and ENT evaluations did not reveal source of acute bleeding. Trach placed by Dr. Brooks SailorsFeinstien on 07/28/13 and patient tolerating trach collar. He is NPO due to sever dysphagia but deemed too high risk for PEG placement by IR and CCS and tolerating tube feeds per NGT.  Is starting to have minimal movement on left.  Is tolerating panda tube feeds without difficulty. Trach downsized to CFS # 6 yesterday and PMSV attempted. Patient able to cough tracheally and clear congestion.  Tracheal secretions are blood tinged at times. He developed hematuria with urinary retention and foley placed on 08/07/13. Has  had complaints of bladder pain and foley d/c today.  Currently has a condom catheter.  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 PT/OT/ST ongoing and patient participating/tolerating therapies.  Able to sit up in chair during the day.  Rehab MD has accepted patient for acute inpatient rehab admission.   Total: 14=NIH  Past Medical History  Past Medical History  Diagnosis Date  . CHF (congestive heart failure)     EF- 10-15  . Medically noncompliant   . Mitral regurgitation   . Tobacco user   . HTN (hypertension)   . AICD (automatic cardioverter/defibrillator) present   . GERD (gastroesophageal reflux disease)   . Substance abuse   . Chronic renal insufficiency   . Syncope   . Thrombus 08/06/2010  . SYSTOLIC HEART FAILURE, CHRONIC 09/22/2008    Qualifier: Diagnosis of  By: Gala Romney, MD, Trixie Dredge   . LV (left ventricular) mural thrombus 01/28/2011  . ICD - IN SITU 09/16/2008    Qualifier: Diagnosis of  By: Wonda Amis    . MITRAL STENOSIS/ INSUFFICIENCY, NON-RHEUMATIC 09/22/2008    Qualifier: Diagnosis of  By: Gala Romney,  MD, Trixie Dredge Hepatomegaly 09/16/2008    Qualifier: Diagnosis of  By: Wonda Amis    . High cholesterol 02/26/2012    "at one time"  . Sleep apnea   . Exertional dyspnea 02/26/2012  . History of blood transfusion 08/2011    "when I had heart pump"  . Migraines   . COMMON MIGRAINE 06/14/2009    Qualifier: Diagnosis of  By: Jonny Ruiz MD, Len Blalock   . History of gout 02/26/2012  . Depression   . Bipolar affective disorder 10/22/2011    pt denies this hx 02/26/2012    Family History  family history is negative for Coronary artery disease.  Prior Rehab/Hospitalizations: None   Current Medications  Current facility-administered medications:0.9 %  sodium chloride infusion, , Intravenous, Continuous, Dolores Patty, MD, Last Rate: 10 mL/hr at 08/11/13 1137;  acetaminophen (TYLENOL) tablet 650 mg, 650 mg, Oral, Q4H PRN, Dolores Patty, MD, 650 mg at 07/21/13 1825;  allopurinol (ZYLOPRIM) tablet 100 mg, 100 mg, Oral, Daily, Dolores Patty, MD, 100 mg at 08/11/13 1027 antiseptic oral rinse (BIOTENE) solution 15 mL, 15 mL, Mouth Rinse, QID, Dolores Patty, MD, 15 mL at 08/11/13 1249;  aspirin chewable tablet 81 mg, 81 mg, Oral, Daily, Laurey Morale, MD, 81 mg at 08/11/13 1027;  chlorhexidine (PERIDEX) 0.12 % solution 15 mL, 15 mL, Mouth Rinse, BID, Dolores Patty, MD, 15 mL at 08/11/13 1026;  citalopram (CELEXA) tablet 10 mg, 10 mg, Oral, Daily, Dolores Patty, MD, 10 mg at 08/11/13 1026 clopidogrel (PLAVIX) tablet 75 mg, 75 mg, Oral, Q breakfast, Dolores Patty, MD, 75 mg at 08/11/13 1026;  feeding supplement (OSMOLITE 1.5 CAL) liquid 1,000 mL, 1,000 mL, Per Tube, Continuous, Ailene Ards, RD, Last Rate: 40 mL/hr at 08/11/13 1137, 1,000 mL at 08/11/13 1137;  feeding supplement (PRO-STAT SUGAR FREE 64) liquid 30 mL, 30 mL, Per Tube, QID, Ailene Ards, RD, 30 mL at 08/11/13 1027 fentaNYL (SUBLIMAZE) injection 25-100 mcg, 25-100 mcg, Intravenous, Q2H  PRN, Alyson Reedy, MD, 100 mcg at 08/05/13 1126;  furosemide (LASIX) tablet 40 mg, 40 mg, Oral, Daily, Dolores Patty, MD, 40 mg at 08/11/13 1027;  glycopyrrolate (ROBINUL) tablet 1 mg, 1 mg, Per Tube, BID,  Oretha Milch, MD, 1 mg at 08/11/13 1026;  hydrALAZINE (APRESOLINE) injection 10 mg, 10 mg, Intravenous, Q6H PRN, [name], NP lipase/protease/amylase (CREON-12/PANCREASE) capsule 2 capsule, 2 capsule, Oral, Once, Dolores Patty, MD;  loperamide (IMODIUM) 1 MG/5ML solution 2 mg, 2 mg, Per Tube, Q12H PRN, Oretha Milch, MD, 2 mg at 08/11/13 1027;  magnesium oxide (MAG-OX) tablet 400 mg, 400 mg, Oral, Daily, Dolores Patty, MD, 400 mg at 08/11/13 1026 nitroGLYCERIN (NITROSTAT) SL tablet 0.4 mg, 0.4 mg, Sublingual, Q5 Min x 3 PRN, Dolores Patty, MD, 0.4 mg at 07/17/13 1725;  nitroGLYCERIN 0.2 mg/mL in dextrose 5 % infusion, 2-200 mcg/min, Intravenous, Titrated, Dolores Patty, MD;  ondansetron (ZOFRAN) injection 4 mg, 4 mg, Intravenous, Q6H PRN, Dolores Patty, MD pantoprazole sodium (PROTONIX) 40 mg/20 mL oral suspension 40 mg, 40 mg, Per Tube, Q1200, Oretha Milch, MD, 40 mg at 08/10/13 1147;  sodium bicarbonate tablet 650 mg, 650 mg, Oral, Once, Dolores Patty, MD;  sodium chloride 0.9 % injection 10-40 mL, 10-40 mL, Intracatheter, Q12H, Dolores Patty, MD, 20 mL at 08/11/13 0535;  spironolactone (ALDACTONE) tablet 25 mg, 25 mg, Oral, Daily, Dolores Patty, MD, 25 mg at 08/11/13 1027 tamsulosin (FLOMAX) capsule 0.4 mg, 0.4 mg, Oral, Daily, Dolores Patty, MD, 0.4 mg at 08/11/13 1027;  warfarin (COUMADIN) tablet 7.5 mg, 7.5 mg, Oral, q1800, Dolores Patty, MD, 7.5 mg at 08/10/13 1802;  Warfarin - Pharmacist Dosing Inpatient, , Does not apply, q1800, Dolores Patty, MD  Patients Current Diet: NPO  Precautions / Restrictions Precautions Precautions: Fall Precaution Comments: LVAD Restrictions Weight Bearing Restrictions: No   Prior Activity  Level Community (5-7x/wk): Went to [employer] M-F 5 days a week.  Hermina Barters is his car or rode the bus to school.  Home Assistive Devices / Equipment Home Assistive Devices/Equipment: Contact lenses;Other (Comment) (LVAD)  Prior Functional Level Prior Function Level of Independence: Independent with assistive device(s)  Current Functional Level Cognition  Overall Cognitive Status: Within Functional Limits for tasks assessed (No obvious cognitive deficits noted) Difficult to assess due to: Tracheostomy Orientation Level: Intubated/Tracheostomy - Unable to assess    Extremity Assessment (includes Sensation/Coordination)          ADLs  Eating/Feeding: NPO Grooming: Teeth care;Supervision/safety;Set up;Wash/dry face Where Assessed - Grooming: Supported sitting Upper Body Bathing: Minimal assistance Where Assessed - Upper Body Bathing: Supported sitting Lower Body Bathing: Moderate assistance Where Assessed - Lower Body Bathing: Supported sit to stand Upper Body Dressing: Moderate assistance Where Assessed - Upper Body Dressing: Unsupported sitting;Supported sitting Lower Body Dressing: +1 Total assistance Where Assessed - Lower Body Dressing: Supported sit to stand Toilet Transfer: Minimal assistance Toilet Transfer Method: Sit to stand;Stand pivot Acupuncturist: Bedside commode Toileting - Clothing Manipulation and Hygiene: Maximal assistance Where Assessed - Engineer, mining and Hygiene: Standing Transfers/Ambulation Related to ADLs: Min A for transfers; min A +2 for ambulation  ADL Comments: Treatment focused on facilitation of reach with Lt. UE.  Pt now able to reach for retrieve and give therapist a lotion bottle at ~45* shoulder flexion with min - mod facilitation (variable performance).  Ideomotor apraxia remains.  Discussed with pt need to attempt to use and move Lt. UE throughout the day and instructed him on movements and activities for him to perform.  he  was able to return demonstration     Mobility  Overal bed mobility: Needs Assistance Bed Mobility: Sit to Supine Supine to  sit: Mod assist Sit to supine: Min assist General bed mobility comments: Pt maneuvered all cords/lines and was able to move to supine with assist only for Lt. LE    Transfers  Overall transfer level: Needs assistance Equipment used: 1 person hand held assist Transfers: Sit to/from [employer] Sit to Stand: Min assist Stand pivot transfers: Min assist General transfer comment: Requires increased time    Ambulation / Gait / Stairs / Wheelchair Mobility  Ambulation/Gait Ambulation Distance (Feet): 12 Feet (forward and back times 1) General Gait Details: Pt requires physical assist for control of w/shift and upper trunk, control/assist for LE ataxia.  Pt was more unsteady today with less ability to contol knee stability on L LE.  However, decent heel/toe pattern bil.    Posture / Balance Dynamic Sitting Balance Sitting balance - Comments: EOB reaching > 6" off BOS and with rotational reaching to Lt. (with Rt. UE    Special needs/care consideration BiPAP/CPAP No CPM No Continuous Drip IV No Dialysis No        Life Vest No Oxygen Trach collar Special Bed No Trach Size Yes, downsized to #6 trach 08/10/13 Wound Vac (area) No      Skin Dry skin                              Bowel mgmt: Had loose stool 08/11/13, is incontinent at times Bladder mgmt: Condom cath in place Diabetic mgmt No LVAD - Yes, no chest compressions    Previous Home Environment Living Arrangements: Other relatives Home Care Services: Yes Type of Home Care Services: Homehealth aide  Discharge Living Setting Plans for Discharge Living Setting: Patient's home;House;Lives with (comment) (Lives with girlfriend.) Type of Home at Discharge: House Discharge Home Layout: One level Discharge Home Access: Stairs to enter Entrance Stairs-Number of Steps: 1 small step up at  entrance. Does the patient have any problems obtaining your medications?: No  Social/Family/Support Systems Patient Roles: Parent;Other (Comment) (Has a girlfriend, 1 son and 2 daughters.) Contact Information: Joellyn Rued - girlfriend and Renato Gails - mother Anticipated Caregiver: Delaney Meigs - girlfriend Anticipated Caregiver's Contact Information: Delaney Meigs [phone] Ability/Limitations of Caregiver: Girlfriend lives with patient and can stay with him in the hospital.  She does go to school, but can take time off to stay with patient. Caregiver Availability: 24/7 Discharge Plan Discussed with Primary Caregiver: Yes Is Caregiver In Agreement with Plan?: Yes Does Caregiver/Family have Issues with Lodging/Transportation while Pt is in Rehab?: No  Goals/Additional Needs Patient/Family Goal for Rehab: PT S, OT S/Min assist, ST S goals Expected length of stay: 14 days Cultural Considerations: None Dietary Needs: NPO with nasal tube feeds Equipment Needs: TBD Special Service Needs: Has a LVAD since 2013.  Has a trach. Pt/Family Agrees to Admission and willing to participate: Yes (Spoke with girlfriend by phone and at bedside.) Program Orientation Provided & Reviewed with Pt/Caregiver Including Roles  & Responsibilities: Yes  Decrease burden of Care through IP rehab admission: N/A  Possible need for SNF placement upon discharge: Yes, if patient does not progress to where patient and girlfriend can manage at home.  Patient Condition: This patient's medical and functional status has changed since the consult dated: 08/03/13 in which the Rehabilitation Physician determined and documented that the patient's condition is appropriate for intensive rehabilitative care in an inpatient rehabilitation facility. See "History of Present Illness" (above) for medical update. Functional changes are: Currently requiring mod assist +  2 to ambulate 12 feet with +2 HHA. Patient's medical and functional status  update has been discussed with the Rehabilitation physician and patient remains appropriate for inpatient rehabilitation. Will admit to inpatient rehab today.  Preadmission Screen Completed By:  Trish Mage, 08/11/2013 1:02 PM ______________________________________________________________________   Discussed status with Dr. Riley Kill on 08/11/13 at 1302 and received telephone approval for admission today.  Admission Coordinator:  Trish Mage, time1320/Date02/18/15

## 2013-08-11 NOTE — Progress Notes (Signed)
Patient ID: [name], male   DOB: 1961-03-25, 53 y.o.   MRN: [id number] Advanced Heart Failure Rounding Note  [name] is 53 y/o male with a history of severe CHF due to NICM EF 10-15%, and severe MR s/p HM II LVAD placed at [facility] March 2013. His medical history also consists of CRI, NSVT s/p ICD and LV thrombus.   Admitted 1/24 with NSTEMI felt secondary due embolic phenomenon due to pump thrombosis.  LDH high around 900.  On way to cath lab 1/26 developed acute onset of aphasia and left-sided hemiparesis. CT negative for bleed. Taken emergently to neuro-interventional suite but no amenable lesions. Remains intubated on levophed. Remains on propofol gtt, unable to assess neuro status.  S/P TEE 1/28. No evidence of clot but with RV failure.   Extubated 1/30. Reintubated 2/2 due to inability control secretions. Trach placed due to difficulty with secretions. Able to take a few steps with PT on 2/13. Developed urinary retention with clots. Foley placed 2/14 . 300cc obtained.   Transferred to 2W yesterday. Stable overnight. MAPs 90-100s. On plavix, ASA and Coumadin. Complaints of bladder pain. Denies SOB, orthopnea, or CP. Down sized 6 Shiley yesterday.   Hgb 11.6>>>> 8.5 >8.3>8.3>8.8  LDH:  982>966>912>948>960>958 INR 2.6>2.74   VAD interrogated personally.  Flow 5.4, Speed 9200 PI 6.0 Power 6.0  Events: frequent PI events  Filed Vitals:   08/11/13 0305 08/11/13 0400 08/11/13 0438 08/11/13 0804  BP:      Pulse: 94 91  98  Temp:   98.2 F (36.8 C)   TempSrc:   Oral   Resp: 17 18  18   Height:      Weight:      SpO2: 99% 99%  98%    Intake/Output Summary (Last 24 hours) at 08/11/13 0807 Last data filed at 08/11/13 0645  Gross per 24 hour  Intake   1450 ml  Output    700 ml  Net    750 ml   Scheduled Meds: . allopurinol  100 mg Oral Daily  . antiseptic oral rinse  15 mL Mouth Rinse QID  . aspirin  81 mg Oral Daily  . chlorhexidine  15 mL Mouth Rinse BID  . citalopram  10 mg Oral  Daily  . clopidogrel  75 mg Oral Q breakfast  . feeding supplement (PRO-STAT SUGAR FREE 64)  30 mL Per Tube QID  . furosemide  40 mg Oral Daily  . glycopyrrolate  1 mg Per Tube BID  . magnesium oxide  400 mg Oral Daily  . pantoprazole sodium  40 mg Per Tube Q1200  . sodium chloride  10-40 mL Intracatheter Q12H  . spironolactone  25 mg Oral Daily  . warfarin  7.5 mg Oral q1800  . Warfarin - Pharmacist Dosing Inpatient   Does not apply q1800   Continuous Infusions: . sodium chloride 20 mL/hr at 08/10/13 2006  . feeding supplement (OSMOLITE 1.5 CAL) 1,000 mL (08/11/13 0324)  . nitroGLYCERIN Stopped (07/17/13 1800)   PRN Meds:.acetaminophen, fentaNYL, hydrALAZINE, loperamide, nitroGLYCERIN, ondansetron (ZOFRAN) IV  LABS: Basic Metabolic Panel:  Recent Labs  06/25/70 0500  NA 143  K 3.7  CL 105  CO2 28  GLUCOSE 116*  BUN 19  CREATININE 0.89  CALCIUM 9.0   Liver Function Tests: No results found for this basename: AST, ALT, ALKPHOS, BILITOT, PROT, ALBUMIN,  in the last 72 hours No results found for this basename: LIPASE, AMYLASE,  in the last 72 hours CBC:  Recent  Labs  08/09/13 0400 08/10/13 0500  WBC 6.5 6.1  HGB 8.6* 8.8*  HCT 27.4* 27.8*  MCV 91.9 92.1  PLT 368 343   Cardiac Enzymes: No results found for this basename: CKTOTAL, CKMB, CKMBINDEX, TROPONINI,  in the last 72 hours RADIOLOGY: Dg Chest Portable 1 View  07/17/2013   CLINICAL DATA:  Left-sided chest pain for 3 days, weakness and dizziness.  EXAM: PORTABLE CHEST - 1 VIEW  COMPARISON:  08/03/2012  FINDINGS: Evidence of median sternotomy noted with LVAD in place. Left-sided defibrillator noted. Mild prominence of the cardiac silhouette persists. Tricuspid valvuloplasty reidentified. Trace left pleural fluid or thickening persists. No new pulmonary opacity.  IMPRESSION: No new acute abnormality. Stable appearance of trace left pleural fluid or thickening.   Electronically Signed   By: Christiana PellantGretchen  Green M.D.   On:  07/17/2013 10:12    PHYSICAL EXAM- CVP ~13 General: NAD; on trach collar; Sitting in chair Neck: JVP 13; trach collar Lungs: coarse throughout CV: LVAD pump hum normal.   Abdomen: Nontender no hepatosplenomegaly, mild distention. + BS Neurologic: awake interactive. Expressive aphasia. Moving left leg, slight movement left hand.   Extremities: No clubbing or cyanosis.  1+ edema RUE PICC  TELEMETRY: SR 90-100s    Assessment:   1. Acute embolic CVA 2. 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  3. 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 NICM EF 15%  5. Anemia 6. Shock - mixed picture RV failure/vasodilatory 7. Respiratory failure s/p trach 8. Urinary retention - Foley placed 2/14   Plan/Discussion:    He has had NSTEMI and acute CVA due to pump thrombosis/embolism. Now with expressive aphasia L hemiplegia. Making progress. Plan to transfer to CIR today!!! Will change vitals to q shift.   INR therapeutic. LDH remains stable in 950 range. Suggestive of ongoing low-grade hemolysis due to persistent pump thrombosis. I discussed this with D[facility]. [place] [facility] [place] [facility] and will defer consideration of pump exchange until his RV and stroke symptoms recover further. Continue coumadin, ASA and Plavix.  Trach down sized to 6 shiley yesterday. Has tried to use the [employer] Speaking valve, however still not able to speak.   Deemed too high risk currently for PEG or J-tube.  To keep panda tube for now and assess in the future.   Hemodynamically stable off inotropes. Through RV still very tenuous. Will continue to monitor.  Complaints of bladder pain. Had RN bladder scan this am and he had 640 cc urine. Concerned that clot on foley, will remove and place condom cath. Will also start flomax for urinary retention. Will get urology consult and may need in and out caths.   VAD interrogated personally. Stable.    Aundria Rudosgrove, Ali B, NP-C 8:07 AM   Patient seen and  examined with Ulla PotashAli Cosgrove, NP. We discussed all aspects of the encounter. I agree with the assessment and plan as stated above.   Continues to improve. Agree with transfer to CIR today. Need to watch urinary retention carefully. Was able to void this am with PVR 150cc. Follow closely. May need to get Urology on board.  Appreciate CIR's care. Hopefully he will improve significantly with Rehab. We will continue to follow on a daily basis.   VAD interrogated personally. Parameters stable.   Truman Haywardaniel Kendrah Lovern,MD 8:37 AM

## 2013-08-11 NOTE — Progress Notes (Addendum)
Patient admitted from 2W.  Patient and girlfriend oriented to room and unit.  LVAD connected to wall unit.  Trach and panda intact.  Vitals stable; denies pain.  Will continue to monitor.

## 2013-08-11 NOTE — Progress Notes (Signed)
ANTICOAGULATION CONSULT NOTE - Follow Up Consult  Pharmacy Consult for coumadin Indication: LVAD thrombosis  Allergies  Allergen Reactions  . Ace Inhibitors Cough  . Lexapro [Escitalopram Oxalate] Other (See Comments)    somnolence    Patient Measurements: Height: 5\' 4"  (162.6 cm) Weight:  (could not weight pt./bed is malfunctioning) IBW/kg (Calculated) : 59.2   Vital Signs: Temp: 98.2 F (36.8 C) (02/18 0438) Temp src: Oral (02/18 0438) Pulse Rate: 98 (02/18 0804)  Labs:  Recent Labs  08/09/13 0400 08/10/13 0500 08/11/13 0535  HGB 8.6* 8.8*  --   HCT 27.4* 27.8*  --   PLT 368 343  --   LABPROT 24.2* 27.1* 28.1*  INR 2.26* 2.62* 2.74*  HEPARINUNFRC 0.48  --   --   CREATININE  --  0.89  --     Estimated Creatinine Clearance: 97.5 ml/min (by C-G formula based on Cr of 0.89).   Medications:  Scheduled:  . allopurinol  100 mg Oral Daily  . antiseptic oral rinse  15 mL Mouth Rinse QID  . aspirin  81 mg Oral Daily  . chlorhexidine  15 mL Mouth Rinse BID  . citalopram  10 mg Oral Daily  . clopidogrel  75 mg Oral Q breakfast  . feeding supplement (PRO-STAT SUGAR FREE 64)  30 mL Per Tube QID  . furosemide  40 mg Oral Daily  . glycopyrrolate  1 mg Per Tube BID  . magnesium oxide  400 mg Oral Daily  . pantoprazole sodium  40 mg Per Tube Q1200  . sodium chloride  10-40 mL Intracatheter Q12H  . spironolactone  25 mg Oral Daily  . tamsulosin  0.4 mg Oral Daily  . warfarin  7.5 mg Oral q1800  . Warfarin - Pharmacist Dosing Inpatient   Does not apply q1800    Assessment:  53 yr old Mr Merkin was admitted with NSTEMI thought to be secondary to pump thrombosis. On the way to the cath lab, he exhibited symptoms of a CVA. He was extubated 1/30 but due to inability to control secretions, he was reintubated and now a trach was placed. Heparin drip stopped 2/16 after INR > 2 x48hr. CBC stable. INR 2.74 - hematuria from foley trauma - now out   Goal of Therapy:  INR  2-3 Monitor platelets by anticoagulation protocol: Yes   Plan:  Coumadin 7.5 mg daily Daily INR   Leota Sauers Pharm.D. CPP, BCPS Clinical Pharmacist [phone] 08/11/2013 8:34 AM

## 2013-08-11 NOTE — H&P (Signed)
Physical Medicine and Rehabilitation Admission H&P    Chief Complaint  Patient presents with  . Left hemiparesis, aphasia, apraxia, dysphagia, trach dependent, LVAD thrombosis.   HPI:  [name] is a 53 y.o. 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 He is s/p LVAD placement at [facility] March 2013, as bridge to transplant. He was admitted for evaluation of new NSTEMI felt possibly to be related to pump thrombosis. Hosp course complicated by episodes of CP and hypotension on 1/24 pm hours requiring pressors. He . 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 Aspiration PNA treated with zosyn. TEE 1/28 without evidence of clot but with RV failure   Extubated on 07/23/13 but has difficulty handling secretions and reintubated on 07/25/13. He developed oropharyngeal bleeding and ENT evaluations did not reveal source of acute bleeding. Trach placed by Dr. Harland Dingwall on 07/28/13 and patient tolerating trach collar. He is NPO due to sever dysphagia but deemed too high risk for PEG placement by IR and CCS. Is tolerating panda tube feeds without difficulty. Trach downsized to CFS # 6 yesterday and PMSV trials attempted.  Patient able to cough tracheally.   He developed hematuria with urinary retention and foley placed on 08/07/13. Has had complaints of bladder pain and foley d/c today. 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 Therapies ongoing and patient showing improvement in left hemiparesis. He continues with ideomotor/ oromotor apraxia, severe oropharyngeal dysphagia as well as aphasia. Rehab team recommending CIR and patient admitted today.      Review of Systems  Unable to perform ROS: language   Past Medical History  Diagnosis Date  . CHF (congestive heart failure)     EF- 10-15  . Medically noncompliant   . Mitral  regurgitation   . Tobacco user   . HTN (hypertension)   . AICD (automatic cardioverter/defibrillator) present   . GERD (gastroesophageal reflux disease)   . Substance abuse   . Chronic renal insufficiency   . Syncope   . Thrombus 08/06/2010  . SYSTOLIC HEART FAILURE, CHRONIC 09/22/2008    Qualifier: Diagnosis of  By: Haroldine Laws, MD, Eileen Stanford   . LV (left ventricular) mural thrombus 01/28/2011  . ICD - IN SITU 09/16/2008    Qualifier: Diagnosis of  By: Peri Maris    . MITRAL STENOSIS/ INSUFFICIENCY, NON-RHEUMATIC 09/22/2008    Qualifier: Diagnosis of  By: Haroldine Laws, MD, Eileen Stanford Hepatomegaly 09/16/2008    Qualifier: Diagnosis of  By: Peri Maris    . High cholesterol 02/26/2012    "at one time"  . Sleep apnea   . Exertional dyspnea 02/26/2012  . History of blood transfusion 08/2011    "when I had heart pump"  . Migraines   . COMMON MIGRAINE 06/14/2009    Qualifier: Diagnosis of  By: Jenny Reichmann MD, Hunt Oris   . History of gout 02/26/2012  . Depression   . Bipolar affective disorder 10/22/2011    pt denies this hx 02/26/2012   Past Surgical History  Procedure Laterality Date  . Cardiac defibrillator placement  ~ 2008  . Left ventricular assist device  08/2011  . Radiology with anesthesia N/A 07/19/2013    Procedure: RADIOLOGY WITH ANESTHESIA;  Surgeon: Rob Hickman, MD;  Location: [name];  Service: Radiology;  Laterality: N/A;  . Tracheostomy  feinstein   Family History  Problem Relation Age of Onset  . Coronary artery disease Neg Hx    Social History: Single but has a supportive girlfriend and friends. Used to work as a Curator till 2 years ago. Per reports that he quit smoking about 2 years ago. His smoking use included Cigarettes. He has a 8.25 pack-year smoking history. He quit smokeless tobacco use about 2 years ago. Per reports that he does not drink alcohol or use illicit drugs.      Allergies  Allergen Reactions  . Ace Inhibitors Cough  .  Lexapro [Escitalopram Oxalate] Other (See Comments)    somnolence   Medications Prior to Admission  Medication Sig Dispense Refill  . amLODipine (NORVASC) 10 MG tablet Take 10 mg by mouth daily.      Marland Kitchen warfarin (COUMADIN) 5 MG tablet Take 5 mg by mouth daily.       . [DISCONTINUED] aspirin EC 81 MG tablet Take 81 mg by mouth daily.      . [DISCONTINUED] carvedilol (COREG) 25 MG tablet Take 50 mg by mouth 2 (two) times daily with a meal.      . [DISCONTINUED] clopidogrel (PLAVIX) 75 MG tablet Take 1 tablet (75 mg total) by mouth daily with breakfast.  30 tablet  4  . [DISCONTINUED] gabapentin (NEURONTIN) 300 MG capsule Take 300 mg by mouth 3 (three) times daily.      . [DISCONTINUED] hydrALAZINE (APRESOLINE) 100 MG tablet Take 100 mg by mouth 3 (three) times daily.      . [DISCONTINUED] losartan (COZAAR) 100 MG tablet Take 100 mg by mouth daily.      . [DISCONTINUED] magnesium oxide (MAG-OX) 400 MG tablet Take 400 mg by mouth daily.      . [DISCONTINUED] potassium chloride (K-DUR,KLOR-CON) 10 MEQ tablet Take 20 mEq by mouth daily.      . [DISCONTINUED] ranitidine (ZANTAC) 150 MG tablet Take 150 mg by mouth 2 (two) times daily.       . [DISCONTINUED] torsemide (DEMADEX) 20 MG tablet Take 2 tablets (40 mg total) by mouth daily.  30 tablet  3  . allopurinol (ZYLOPRIM) 100 MG tablet Take 100 mg by mouth as needed. For gout        Home: Home Living Family/patient expects to be discharged to:: Unsure Living Arrangements: Other relatives   Functional History:    Functional Status:  Mobility:     Ambulation/Gait Ambulation Distance (Feet): 12 Feet (forward and back times 1) General Gait Details: Pt requires physical assist for control of w/shift and upper trunk, control/assist for LE ataxia.  Pt was more unsteady today with less ability to contol knee stability on L LE.  However, decent heel/toe pattern bil.    ADL: ADL Eating/Feeding: NPO Grooming: Teeth care;Supervision/safety;Set  up;Wash/dry face Where Assessed - Grooming: Supported sitting Upper Body Bathing: Minimal assistance Where Assessed - Upper Body Bathing: Supported sitting Lower Body Bathing: Moderate assistance Where Assessed - Lower Body Bathing: Supported sit to stand Upper Body Dressing: Moderate assistance Where Assessed - Upper Body Dressing: Unsupported sitting;Supported sitting Lower Body Dressing: +1 Total assistance Where Assessed - Lower Body Dressing: Supported sit to stand Toilet Transfer: Minimal assistance Toilet Transfer Method: Sit to stand;Stand pivot Toilet Transfer Equipment: Bedside commode Transfers/Ambulation Related to ADLs: Min A for transfers; min A +2 for ambulation  ADL Comments: Treatment focused on facilitation of reach with Lt. UE.  Pt now able to reach for retrieve and give therapist a lotion bottle  at ~45* shoulder flexion with min - mod facilitation (variable performance).  Ideomotor apraxia remains.  Discussed with pt need to attempt to use and move Lt. UE throughout the day and instructed him on movements and activities for him to perform.  he was able to return demonstration   Cognition: Cognition Overall Cognitive Status: Within Functional Limits for tasks assessed (No obvious cognitive deficits noted) Orientation Level: Other (comment) (Tracheostomy-unable to assess) Cognition Arousal/Alertness: Awake/alert Behavior During Therapy: WFL for tasks assessed/performed Overall Cognitive Status: Within Functional Limits for tasks assessed (No obvious cognitive deficits noted) Difficult to assess due to: Tracheostomy  Physical Exam: Blood pressure 91/77, pulse 98, temperature 98.2 F (36.8 C), temperature source Oral, resp. rate 18, height _0  (1.626 m), weight 84.8 kg (186 lb 15.2 oz), SpO2 98.00%. Physical Exam  Nursing note and vitals reviewed.  Constitutional: He appears well-developed and well-nourished.  No distress HENT: NGT in place Head:  Atraumatic. Eyes: Conjunctivae are normal. Pupils are equal, round, and reactive to light.  Neck:  #6 Trach in place, cuffless with slight blood tinged/dried secretions surrounding area.   Unable to phonate around Cardiovascular:  +Hum. Heart rate 100-110. LVAD insertion site clean and intact Respiratory: No respiratory distress. He has no wheezes.  GI: Soft. He exhibits mild distension. There is no tenderness. Bowel sounds present Musculoskeletal:  Edema left hand of tr to 1+ Neurological: He is alert.  Smiles and makes good eye contact. able to open mouth to command and protrudes tongue.. Able to follow basic commands. Moves right side without difficulty. Left UE  1/5 with deltoid, bicep, tricep, HI. LE: 1+ to 2- HF, KE and 1 to 1+ at ankle. Senses pain on left.  Left foot with sustained clonus. More alert. Follows simple commands Skin: Skin is warm and dry.    Results for orders placed during the hospital encounter of 07/17/13 (from the past 48 hour(s))  GLUCOSE, CAPILLARY     Status: Abnormal   Collection Time    08/09/13  2:28 PM      Result Value Ref Range   Glucose-Capillary 105 (*) 70 - 99 mg/dL  GLUCOSE, CAPILLARY     Status: Abnormal   Collection Time    08/09/13 10:25 PM      Result Value Ref Range   Glucose-Capillary 110 (*) 70 - 99 mg/dL   Comment 1 Documented in Chart     Comment 2 Notify RN    PROTIME-INR     Status: Abnormal   Collection Time    08/10/13  5:00 AM      Result Value Ref Range   Prothrombin Time 27.1 (*) 11.6 - 15.2 seconds   INR 2.62 (*) 0.00 - 1.49  LACTATE DEHYDROGENASE     Status: Abnormal   Collection Time    08/10/13  5:00 AM      Result Value Ref Range   LDH 958 (*) 94 - 250 U/L  CBC     Status: Abnormal   Collection Time    08/10/13  5:00 AM      Result Value Ref Range   WBC 6.1  4.0 - 10.5 K/uL   RBC 3.02 (*) 4.22 - 5.81 MIL/uL   Hemoglobin 8.8 (*) 13.0 - 17.0 g/dL   HCT 27.8 (*) 39.0 - 52.0 %   MCV 92.1  78.0 - 100.0 fL   MCH  29.1  26.0 - 34.0 pg   MCHC 31.7  30.0 - 36.0 g/dL   RDW 15.7 (*)  11.5 - 15.5 %   Platelets 343  150 - 400 K/uL  BASIC METABOLIC PANEL     Status: Abnormal   Collection Time    08/10/13  5:00 AM      Result Value Ref Range   Sodium 143  137 - 147 mEq/L   Potassium 3.7  3.7 - 5.3 mEq/L   Chloride 105  96 - 112 mEq/L   CO2 28  19 - 32 mEq/L   Glucose, Bld 116 (*) 70 - 99 mg/dL   BUN 19  6 - 23 mg/dL   Creatinine, Ser 0.89  0.50 - 1.35 mg/dL   Calcium 9.0  8.4 - 10.5 mg/dL   GFR calc non Af Amer >90  >90 mL/min   GFR calc Af Amer >90  >90 mL/min   Comment: (NOTE)     The eGFR has been calculated using the CKD EPI equation.     This calculation has not been validated in all clinical situations.     eGFR's persistently <90 mL/min signify possible Chronic Kidney     Disease.  GLUCOSE, CAPILLARY     Status: None   Collection Time    08/10/13  8:49 AM      Result Value Ref Range   Glucose-Capillary 92  70 - 99 mg/dL   Comment 1 Documented in Chart     Comment 2 Notify RN    GLUCOSE, CAPILLARY     Status: None   Collection Time    08/10/13  3:58 PM      Result Value Ref Range   Glucose-Capillary 90  70 - 99 mg/dL   Comment 1 Documented in Chart     Comment 2 Notify RN    GLUCOSE, CAPILLARY     Status: Abnormal   Collection Time    08/10/13 10:00 PM      Result Value Ref Range   Glucose-Capillary 124 (*) 70 - 99 mg/dL  PROTIME-INR     Status: Abnormal   Collection Time    08/11/13  5:35 AM      Result Value Ref Range   Prothrombin Time 28.1 (*) 11.6 - 15.2 seconds   INR 2.74 (*) 0.00 - 1.49  GLUCOSE, CAPILLARY     Status: Abnormal   Collection Time    08/11/13  6:18 AM      Result Value Ref Range   Glucose-Capillary 106 (*) 70 - 99 mg/dL   No results found.  Post Admission Physician Evaluation: 1. Functional deficits secondary  to Embolic bilateral BG CVA's, left thalamus CVA with dense left hemiparesis. 2. Patient is admitted to receive collaborative,  interdisciplinary care between the physiatrist, rehab nursing staff, and therapy team. 3. Patient's level of medical complexity and substantial therapy needs in context of that medical necessity cannot be provided at a lesser intensity of care such as a SNF. 4. Patient has experienced substantial functional loss from his/her baseline which was documented above under the "Functional History" and "Functional Status" headings.  Judging by the patient's diagnosis, physical exam, and functional history, the patient has potential for functional progress which will result in measurable gains while on inpatient rehab.  These gains will be of substantial and practical use upon discharge  in facilitating mobility and self-care at the household level. 5. Physiatrist will provide 24 hour management of medical needs as well as oversight of the therapy plan/treatment and provide guidance as appropriate regarding the interaction of the two. 6. 24 hour rehab nursing will  assist with bladder management, bowel management, safety, skin/wound care, disease management, medication administration, pain management and patient education  and help integrate therapy concepts, techniques,education, etc. 7. PT will assess and treat for/with: Lower extremity strength, range of motion, stamina, balance, functional mobility, safety, adaptive techniques and equipment, NMR, equipment management, education.   Goals are: supervision to min assist. 8. OT will assess and treat for/with: ADL's, functional mobility, safety, upper extremity strength, adaptive techniques and equipment, NMR, equipment mgt, education.   Goals are: min assist. 9. SLP will assess and treat for/with: speech, swallowing, communication.  Goals are: supervision to min assist. 10. Case Management and Social Worker will assess and treat for psychological issues and discharge planning. 11. Team conference will be held weekly to assess progress toward goals and to determine  barriers to discharge. 12. Patient will receive at least 3 hours of therapy per day at least 5 days per week. 13. ELOS: 24-30 days       14. Prognosis:  excellent   Medical Problem List and Plan: Embolic bilateral BG CVA's, left thalamus CVA, CHF  1. LVAD thrombosis/DVT prophylaxis/Anticoagulation: Pharmaceutical: Coumadin 2. Pain Management:  Will monitor for signs of distress. Tylenol prn.  3. Mood:  No signs of distress but question awareness/insight into deficits. Continue celexa. Will have LCSW follow along for evaluation/support as indicated.  4. Neuropsych: This patient is  capable of making decisions on her own behalf. 5. LVAD: Cardiology to follow for management/support. Pt has awareness of management and can help direct. Girlfriend available and learning about mgt as well. 6. Urinary retention/Hematuria: Resolved but now with green tinged urine--elevated LDH noted. Started on flomax. Will check PVRs and set parameters for cath.  7. Severe dysphagia/oral apraxia:  Continue panda tube. Will get dietician to evaluate for nutritional needs. Concerned that current rate of 40 cc/hr not meeting his calorie needs.  8. Trach dependent:  Tolerating CFS #6. Continue to suction frequently--blood tinged secretions continue.   -trials of PMV with SLP   Meredith Staggers, MD, [facility]   08/11/2013

## 2013-08-11 NOTE — Progress Notes (Signed)
Pt had facial grimacing this morning and pointed to foley catheter; bladder scan showed 700; foley removed per MD orders; bladder scan repeated showing 500; pt spontaneously voided; bladder scan showed 150; pt hygiene performed by staff; condom cath placed; will continue to monitor output; bladder scan again in a few hrs to check for residual; pt resting comfortably in bed; call bell in reach.  BARNETT, Geroge Baseman

## 2013-08-11 NOTE — Interval H&P Note (Signed)
Kaydence Kester was admitted today to Inpatient Rehabilitation with the diagnosis of embolic CVA.  The patient's history has been reviewed, patient examined, and there is no change in status.  Patient continues to be appropriate for intensive inpatient rehabilitation.  I have reviewed the patient's chart and labs.  Questions were answered to the patient's satisfaction.  SWARTZ,ZACHARY T 08/11/2013, 8:18 PM

## 2013-08-12 ENCOUNTER — Inpatient Hospital Stay (HOSPITAL_COMMUNITY): Payer: Medicare Other

## 2013-08-12 ENCOUNTER — Inpatient Hospital Stay (HOSPITAL_COMMUNITY): Payer: Medicare Other | Admitting: Speech Pathology

## 2013-08-12 ENCOUNTER — Inpatient Hospital Stay (HOSPITAL_COMMUNITY): Payer: Medicare Other | Admitting: Occupational Therapy

## 2013-08-12 DIAGNOSIS — I69920 Aphasia following unspecified cerebrovascular disease: Secondary | ICD-10-CM

## 2013-08-12 DIAGNOSIS — I69959 Hemiplegia and hemiparesis following unspecified cerebrovascular disease affecting unspecified side: Secondary | ICD-10-CM

## 2013-08-12 DIAGNOSIS — I509 Heart failure, unspecified: Secondary | ICD-10-CM

## 2013-08-12 DIAGNOSIS — I69991 Dysphagia following unspecified cerebrovascular disease: Secondary | ICD-10-CM

## 2013-08-12 DIAGNOSIS — I6350 Cerebral infarction due to unspecified occlusion or stenosis of unspecified cerebral artery: Secondary | ICD-10-CM

## 2013-08-12 DIAGNOSIS — I6340 Cerebral infarction due to embolism of unspecified cerebral artery: Secondary | ICD-10-CM

## 2013-08-12 DIAGNOSIS — Z95811 Presence of heart assist device: Secondary | ICD-10-CM

## 2013-08-12 DIAGNOSIS — R1312 Dysphagia, oropharyngeal phase: Secondary | ICD-10-CM

## 2013-08-12 DIAGNOSIS — I5023 Acute on chronic systolic (congestive) heart failure: Secondary | ICD-10-CM

## 2013-08-12 DIAGNOSIS — R482 Apraxia: Secondary | ICD-10-CM

## 2013-08-12 LAB — GLUCOSE, CAPILLARY
GLUCOSE-CAPILLARY: 90 mg/dL (ref 70–99)
GLUCOSE-CAPILLARY: 110 mg/dL — AB (ref 70–99)
GLUCOSE-CAPILLARY: 97 mg/dL (ref 70–99)
GLUCOSE-CAPILLARY: 79 mg/dL (ref 70–99)
Glucose-Capillary: 149 mg/dL — ABNORMAL HIGH (ref 70–99)

## 2013-08-12 LAB — BASIC METABOLIC PANEL
BUN: 19 mg/dL (ref 6–23)
CALCIUM: 8.7 mg/dL (ref 8.4–10.5)
CO2: 29 meq/L (ref 19–32)
CREATININE: 0.95 mg/dL (ref 0.50–1.35)
Chloride: 100 mEq/L (ref 96–112)
GFR calc non Af Amer: >90 mL/min (ref >90)
Glucose, Bld: 148 mg/dL — ABNORMAL HIGH (ref 70–99)
Potassium: 3.7 mEq/L (ref 3.7–5.3)
SODIUM: 140 meq/L (ref 137–147)

## 2013-08-12 LAB — CBC
HCT: 27.2 % — ABNORMAL LOW (ref 39.0–52.0)
Hemoglobin: 8.4 g/dL — ABNORMAL LOW (ref 13.0–17.0)
MCH: 28.2 pg (ref 26.0–34.0)
MCHC: 30.9 g/dL (ref 30.0–36.0)
MCV: 91.3 fL (ref 78.0–100.0)
PLATELETS: 359 10*3/uL (ref 150–400)
RBC: 2.98 MIL/uL — AB (ref 4.22–5.81)
RDW: 15.7 % — ABNORMAL HIGH (ref 11.5–15.5)
WBC: 5.1 10*3/uL (ref 4.0–10.5)

## 2013-08-12 LAB — PROTIME-INR
INR: 3.35 — AB (ref 0.00–1.49)
Prothrombin Time: 32.7 seconds — ABNORMAL HIGH (ref 11.6–15.2)

## 2013-08-12 LAB — LACTATE DEHYDROGENASE: LDH: 1048 U/L — ABNORMAL HIGH (ref 94–250)

## 2013-08-12 MED ORDER — SODIUM BICARBONATE 650 MG PO TABS
650.0000 mg | ORAL_TABLET | Freq: Once | ORAL | Status: AC
  Administered 2013-08-12: 650 mg via ORAL
  Filled 2013-08-12 (×3): qty 1

## 2013-08-12 MED ORDER — PANCRELIPASE (LIP-PROT-AMYL) 12000-38000 UNITS PO CPEP
2.0000 | ORAL_CAPSULE | Freq: Once | ORAL | Status: AC
  Administered 2013-08-12: 2 via ORAL
  Filled 2013-08-12 (×2): qty 2

## 2013-08-12 MED ORDER — FUROSEMIDE 40 MG PO TABS
40.0000 mg | ORAL_TABLET | Freq: Two times a day (BID) | ORAL | Status: DC
  Administered 2013-08-12 – 2013-08-17 (×10): 40 mg
  Filled 2013-08-12 (×12): qty 1

## 2013-08-12 MED ORDER — FREE WATER
200.0000 mL | Freq: Four times a day (QID) | Status: DC
  Administered 2013-08-12 – 2013-08-17 (×18): 200 mL

## 2013-08-12 MED ORDER — OSMOLITE 1.5 CAL PO LIQD
1000.0000 mL | ORAL | Status: DC
  Administered 2013-08-12 – 2013-08-17 (×5): 1000 mL
  Filled 2013-08-12 (×8): qty 1000

## 2013-08-12 MED ORDER — SODIUM CHLORIDE 0.9 % IJ SOLN: 10.0000 mL | INTRAMUSCULAR | Status: AC | PRN

## 2013-08-12 NOTE — Progress Notes (Signed)
[name], NP at patient's bedside and is going to discuss replacing the [facility] with another physician prior to writing order to place [facility] in IR.  Awaiting orders; will continue to monitor.

## 2013-08-12 NOTE — Progress Notes (Signed)
RN unable to flush panda tube and unable to give patient meds through [facility] tube; second RN assessed and is unable.  Glennie Hawk, PA notified and ordered ordered Protocol for Unclogging Feeding Tubes with Pancreatic Enzymes.  Will continue to monitor.

## 2013-08-12 NOTE — Progress Notes (Signed)
Deatra Ina, PA notified that Karie Soda has been repositioned and is working properly.  Per Deatra Ina, PA, medications to be given starting with the current hour.  Daily medications that were missed in the morning will be given tomorrow.  Deatra Ina, PA also notified that per pharmacy, flomax is not to be opened and put down the [facility].  Patient also concerned that flomax may clog the tube; patient currently refusing.  Will continue to monitor.

## 2013-08-12 NOTE — Progress Notes (Signed)
VAD coordinator paged to help transfer patient to IP Rehab.  Pt transferred to 4W12 via bed. Reviewed the following with bedside nurse upon patient arrival: HM II controller, perc lock, perc lead, drive line exit site/dressing and stabilization device. Nurse completed changing power source from batteries to power module, system controller self test, power module self test, obtained BP using manual cuff and doppler. Reviewed VAD parameters and correct documentation using Epic flowsheet.  All questions answered; VAD resource notebook available on unit; contact information available including VAD pager available to unit staff.

## 2013-08-12 NOTE — Progress Notes (Signed)
ANTICOAGULATION CONSULT NOTE - Follow Up Consult  Pharmacy Consult for Coumadin Indication: LVAD thrombosis  Allergies  Allergen Reactions  . Ace Inhibitors Cough  . Lexapro [Escitalopram Oxalate] Other (See Comments)    somnolence    Patient Measurements: Weight: 202 lb 2.6 oz (91.7 kg) (pt had six pillows on his bed while his weight was taken.)  Vital Signs: Temp: 99.4 F (37.4 C) (02/19 0544) Temp src: Oral (02/19 0544) Pulse Rate: 105 (02/19 1326)  Labs:  Recent Labs  08/10/13 0500 08/11/13 0535 08/12/13 0510  HGB 8.8*  --  8.4*  HCT 27.8*  --  27.2*  PLT 343  --  359  LABPROT 27.1* 28.1* 32.7*  INR 2.62* 2.74* 3.35*  CREATININE 0.89  --  0.95    The CrCl is unknown because both a height and weight (above a minimum accepted value) are required for this calculation.  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 INR has jumped and is now above goal. Patient continues to have blood tinged secretions, however, no further hematuria since condom cath replaced foley. Clotted panda tube to be exchanged today. Hgb low but stable.  Goal of Therapy:  INR 2-3 Monitor platelets by anticoagulation protocol: Yes   Plan:  1) Hold coumadin tonight 2) Follow up INR in AM  Fredrik Rigger 08/12/2013,2:28 PM

## 2013-08-12 NOTE — Progress Notes (Signed)
Physical Therapy Note  Patient Details  Name: Mandrell Prechtel MRN: [id number] Date of Birth: [birth year] Today's Date: 08/12/2013  2:45 - 3:15 30 minutes Individual session. Patient denies pain. Patient reports tolerating 3 hours of therapy schedule well today.  Patient resting in bed. Patient reports leakage from condom catheter and requesting to change pants. Assisted nursing with changing pants and putting on brief. Patient supine to sit with mod assist. Patient sit to stand and stand turn to wheelchair with mod assist. Patient ambulated +2 HHA 120 feet on level tile in controlled environment. Third person followed with wheelchair and oxygen. Patient with decreased stance time on right; right knee "wobbles" at times but no buckling or genu recurvatum.  Patient ambulated up and down 5 steps with 1 railing and mod assist. Patient performed stand pivot transfer wheelchair to bed with mod assist. Patient sit to supine with min assist for left LE. Patient with +2 assist to scoot up to head of bed. Patient changed to wall oxygen and left resting in bed with items in reach and girlfriend present in room.   Arelia Longest M 08/12/2013, 3:30 PM

## 2013-08-12 NOTE — Evaluation (Signed)
Physical Therapy Assessment and Plan  Patient Details  Name: [name] MRN: [id number] Date of Birth: 11-15-1960  PT Diagnosis: Difficulty walking and Hemiparesis non-dominant Rehab Potential: Good ELOS: 18 - 21 days   Today's Date: 08/12/2013 Time: 0800-0900 Time Calculation (min): 60 min  Problem List:  Patient Active Problem List   Diagnosis Date Noted  . CVA (cerebral infarction) 07/19/2013  . Acute respiratory failure 07/19/2013  . Altered mental status 07/19/2013  . NSTEMI, initial episode of care 07/17/2013  . Supratherapeutic INR 08/18/2012  . Acute hemolysis 07/28/2012  . Hypertension 03/21/2012  . Cough 03/03/2012  . Nausea and vomiting 02/26/2012  . SVT (supraventricular tachycardia) 11/27/2011  . LVAD (left ventricular assist device) present 11/27/2011  . Ventricular tachycardia 11/22/2011  . Bipolar affective disorder 10/22/2011  . Migraine 10/22/2011  . Preventative health care 10/21/2011  . Gastritis, acute 09/04/2011  . Hypotension 08/10/2011  . Cardiogenic shock 07/10/2011  . Acute on chronic systolic heart failure 56/38/7564  . LV (left ventricular) mural thrombus 01/28/2011  . Thrombus 08/06/2010  . TOBACCO ABUSE 07/03/2010  . BACK PAIN 03/10/2009  . MITRAL STENOSIS/ INSUFFICIENCY, NON-RHEUMATIC 09/22/2008  . CONGESTIVE HEART FAILURE 09/22/2008  . SYSTOLIC HEART FAILURE, CHRONIC 09/22/2008  . Other malaise and fatigue 09/16/2008  . ABNORMAL HEART SOUNDS 09/16/2008  . DYSPNEA 09/16/2008  . Hepatomegaly 09/16/2008  . ABNORMAL ECHOCARDIOGRAM 09/16/2008  . ICD-[employer] 09/16/2008    Past Medical History:  Past Medical History  Diagnosis Date  . CHF (congestive heart failure)     EF- 10-15  . Medically noncompliant   . Mitral regurgitation   . Tobacco user   . HTN (hypertension)   . AICD (automatic cardioverter/defibrillator) present   . GERD (gastroesophageal reflux disease)   . Substance abuse   . Chronic renal insufficiency   .  Syncope   . Thrombus 08/06/2010  . SYSTOLIC HEART FAILURE, CHRONIC 09/22/2008    Qualifier: Diagnosis of  By: Eric Laws, [name], [name]   . LV (left ventricular) mural thrombus 01/28/2011  . ICD - IN SITU 09/16/2008    Qualifier: Diagnosis of  By: [name]    . MITRAL STENOSIS/ INSUFFICIENCY, NON-RHEUMATIC 09/22/2008    Qualifier: Diagnosis of  By: Eric Laws, [name], [name] Hepatomegaly 09/16/2008    Qualifier: Diagnosis of  By: [name]    . High cholesterol 02/26/2012    "at one time"  . Sleep apnea   . Exertional dyspnea 02/26/2012  . History of blood transfusion 08/2011    "when I had heart pump"  . Migraines   . COMMON MIGRAINE 06/14/2009    Qualifier: Diagnosis of  By: [name], [name]   . History of gout 02/26/2012  . Depression 08/11/2013    Pt denies  . Bipolar affective disorder 10/22/2011    pt denies this hx 02/26/2012   Past Surgical History:  Past Surgical History  Procedure Laterality Date  . Cardiac defibrillator placement  ~ 2008  . Left ventricular assist device  08/2011  . Radiology with anesthesia N/A 07/19/2013    Procedure: RADIOLOGY WITH ANESTHESIA;  Surgeon: Eric Hickman, [name];  Location: [place];  Service: Radiology;  Laterality: N/A;  . Tracheostomy      feinstein    Assessment & Plan Clinical Impression: [name] is a 53 y.o. 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 He is s/p LVAD placement at [facility] March 2013, as bridge to transplant. He was  admitted for evaluation of new NSTEMI felt possibly to be related to pump thrombosis. Hosp course complicated by episodes of CP and hypotension on 1/24 pm hours requiring pressors. He developed acute onset of left sided weakness with aphasia due to acute right MCA embolic CVA per cerebral arteriogram. Aspiration PNA treated with zosyn. TEE 1/28 without evidence of clot but with RV failure  Extubated on 07/23/13 but has difficulty handling secretions and reintubated on 07/25/13. He  developed oropharyngeal bleeding and ENT evaluations did not reveal source of acute bleeding. Trach placed by [name] on 07/28/13 and patient tolerating trach collar. He is NPO due to sever dysphagia but deemed too high risk for PEG placement by IR and CCS. Is tolerating panda tube feeds without difficulty. Trach downsized to CFS # 6 and PMSV trials attempted. Patient able to cough tracheally. He developed hematuria with urinary retention and foley placed on 08/07/13. Has had complaints of bladder pain. 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 Patient showing improvement in left hemiparesis. He continues with ideomotor/ oromotor apraxia, severe oropharyngeal dysphagia as well as aphasia. Rehab team recommending CIR and patient admitted to CIR on 08/11/2013 .   Patient currently requires max with mobility secondary to decreased sitting balance, decreased standing balance and hemiplegia.  Prior to hospitalization, patient was independent  with mobility and lived with Other (Comment) (girlfriend) in a House home.  Home access is 1Stairs to enter.  Patient will benefit from skilled PT intervention to maximize safe functional mobility, minimize fall risk and decrease caregiver burden for planned discharge home with 24 hour supervision.  Anticipate patient will benefit from follow up [place] at discharge.  PT - End of Session Activity Tolerance: Tolerates 30+ min activity with multiple rests Endurance Deficit: Yes PT Assessment Rehab Potential: Good Barriers to Discharge: Decreased caregiver support PT Patient demonstrates impairments in the following area(s): Balance;Motor PT Transfers Functional Problem(s): Bed Mobility;Bed to Chair;Car PT Locomotion Functional Problem(s): Ambulation;Wheelchair Mobility;Stairs PT Plan PT Intensity: Minimum of 1-2 x/day ,45 to 90 minutes PT Frequency: 5 out of 7 days PT  Duration Estimated Length of Stay: 18 - 21 days PT Treatment/Interventions: Ambulation/gait training;Balance/vestibular training;Discharge planning;Functional mobility training;Neuromuscular re-education;Patient/family education;Stair training;Therapeutic Activities;Therapeutic Exercise;UE/LE Strength taining/ROM;Wheelchair propulsion/positioning PT Transfers Anticipated Outcome(s): supervision basic; min assist car transfers PT Locomotion Anticipated Outcome(s): supervision ambulation with least restrictive device household; min assist with stairs PT Recommendation Follow Up Recommendations: Home health PT Patient destination: Home Equipment Recommended: To be determined  Skilled Therapeutic Intervention Patient resting in bed upon entering room. Patient with multiple lines and tubes: foley catheter, Pic line IV, Panda tube, continuous pulse ox, and LVAD. O2 sats remained in mid to upper 90's throughout session. Patient with no verbalizations, but did answer yes/no questions appropriately. Girlfriend, Jonelle Sidle, present to verify accuracy of answers. Patient participated in initial evaluation. Patient supine to sit with mod to max assist due to lines/tubes. Patient mod assist to scoot to edge of bed. Patient sat edge of bed without support and able to maintain sitting with minor pertubations. Patient with some loss of balance to left with reaching to left. Patient sit to stand with max assist (lifting and lowering) but maintains standing with min assist. Patient performed stand pivot transfer to wheelchair with max assist. Patient stood and marched in place 10 steps with mod hand hold assist. LVAD connected to battery power by therapist. Patient assisted with directing placement of power source and  batteries in holsters. Patient left in wheelchair with all items in reach and girlfriend present.  PT Evaluation Precautions/Restrictions Precautions Precautions: Fall Precaution Comments:  LVAD Restrictions Weight Bearing Restrictions: No General Chart Reviewed: Yes Response to Previous Treatment: Patient with no complaints from previous session. Family/Caregiver Present: Yes (girlfriend, Jonelle Sidle) Vital SignsTherapy Vitals Pulse Rate: 97 Resp: 16 Patient Position, if appropriate: Sitting Oxygen Therapy SpO2: 95 % O2 Device: Trach collar O2 Flow Rate (L/min): 5 L/min FiO2 (%): 28 % Pulse Oximetry Type: Continuous Pain Pain Assessment Pain Assessment: No/denies pain Home Living/Prior Functioning Home Living Available Help at Discharge: Friend(s);Available 24 hours/day Type of Home: House Home Access: Stairs to enter [employer] of Steps: 1 Entrance Stairs-Rails: None Home Layout: One level  Lives With: Other (Comment) (girlfriend) Prior Function Level of Independence: Independent with gait  Able to Take Stairs?: Yes Driving: Yes Vocation: Student Vision/Perception  Vision - History Baseline Vision: No visual deficits Patient Visual Report: No change from baseline  Cognition Overall Cognitive Status: Within Functional Limits for tasks assessed Orientation Level: Intubated/Tracheostomy - Unable to assess Sensation Sensation Light Touch: Appears Intact Stereognosis: Not tested Hot/Cold: Not tested Proprioception: Not tested Coordination Gross Motor Movements are Fluid and Coordinated: No Fine Motor Movements are Fluid and Coordinated: No Coordination and Movement Description: unable to oppose left thumb to each finger  Finger Nose Finger Test: able to perform slowly with right UE; unable to perform with L Heel Shin Test: able to perform slowly with right LE; unable to perform with L Motor  Motor Motor: Hemiplegia Motor - Skilled Clinical Observations: weakness on left - UE greater than LE  Mobility Bed Mobility Bed Mobility: Supine to Sit;Sitting - Scoot to Edge of Bed Supine to Sit: 3: Mod assist Sitting - Scoot to Edge of Bed: 3: Mod  assist Transfers Transfers: Yes Sit to Stand: 2: Max assist Sit to Stand Details (indicate cue type and reason): lift and lower assist; assistance also required for safety due to lines and tubes Stand Pivot Transfers: 3: Mod assist Locomotion  Ambulation Ambulation:  (unable to assess ambulation due to lines/tubes; pt able to march in place 10 steps)  Trunk/Postural Assessment  Postural Control Postural Control: Deficits on evaluation (pt sits with flexed trunk and head)  Balance Dynamic Sitting Balance Sitting balance - Comments: static sitting with supervision edge of bed; patient tends to lose balance to left with dynamic reaching requiring min assist Extremity Assessment  RUE Assessment RUE Assessment: Within Functional Limits LUE Assessment LUE Assessment: Exceptions to [employer] LUE Strength LUE Overall Strength: Deficits LUE Overall Strength Comments: patient has active elbow flexion against gravity; gross grasp; trace extension of 1st finger only; unable to oppose first finger to thumb RLE Assessment RLE Assessment: Within Functional Limits (grossly 4/5 strength throughout) LLE Assessment LLE Assessment: Exceptions to [facility] LLE Strength LLE Overall Strength: Deficits LLE Overall Strength Comments: 3-/5 hip flexion; 2+ hip abduction/adduction; 3+ knee extension and flexion; 3+ ankle dorsiflexion; 3-/5 plantarflexion  FIM:  FIM - Bed/Chair Transfer Bed/Chair Transfer: 3: Supine > Sit: Mod A (lifting assist/Pt. 50-74%/lift 2 legs;2: Bed > Chair or W/C: Max A (lift and lower assist)   Refer to Care Plan for Long Term Goals  Recommendations for other services: None  Discharge Criteria: Patient will be discharged from PT if patient refuses treatment 3 consecutive times without medical reason, if treatment goals not met, if there is a change in medical status, if patient makes no progress towards goals or if patient is  discharged from hospital.  The above assessment, treatment plan,  treatment alternatives and goals were discussed and mutually agreed upon: by patient  Sanjuana Letters 08/12/2013, 10:00 AM

## 2013-08-12 NOTE — Progress Notes (Signed)
Patient ID: [name], male   DOB: 07/19/1960, 53 y.o.   MRN: [id number] Advanced Heart Failure Rounding Note  [name] is 53 y/o male with a history of severe CHF due to NICM EF 10-15%, and severe MR s/p HM II [facility]. His medical history also consists of CRI, NSVT s/p ICD and LV thrombus.   Admitted 1/24 with NSTEMI felt secondary due embolic phenomenon due to pump thrombosis.  LDH high around 900.  On way to cath lab 1/26 developed acute onset of aphasia and left-sided hemiparesis. CT negative for bleed. Taken emergently to neuro-interventional suite but no amenable lesions. Remains intubated on levophed. Remains on propofol gtt, unable to assess neuro status.  S/P TEE 1/28. No evidence of clot but with RV failure.   Extubated 1/30. Reintubated 2/2 due to inability control secretions. Trach placed due to difficulty with secretions. Able to take a few steps with PT on 2/13. Developed urinary retention with clots. Foley placed 2/14 . 300cc obtained.   Transferred to CIR yesterday. Weight up 6 lbs. MAPs 94-100. On plavix, ASA and Coumadin. Condom cath placed and no longer having bladder pain. Denies SOB, orthopnea, or CP.   Hgb 11.6>>>> 8.5 >8.3>8.3>8.8>8.4  LDH:  982>966>912>948>960>958>1048  INR 2.6>2.74>3.35   VAD interrogated personally. Flow 5.2, Speed 9200 PI 6.0 Power 5.6  Events: 10 PI events    Filed Vitals:   08/12/13 0345 08/12/13 0544 08/12/13 0907 08/12/13 0916  Pulse:  100 97   Temp:  99.4 F (37.4 C)    TempSrc:  Oral    Resp:   16   Weight:  91.7 kg (202 lb 2.6 oz)    SpO2: 95% 96% 96% 95%    Intake/Output Summary (Last 24 hours) at 08/12/13 0933 Last data filed at 08/12/13 [id number]  Gross per 24 hour  Intake    580 ml  Output    900 ml  Net   -320 ml   Scheduled Meds: . allopurinol  100 mg Oral Daily  . antiseptic oral rinse  15 mL Mouth Rinse QID  . aspirin  81 mg Per NG tube Daily  . chlorhexidine  15 mL Mouth Rinse BID  . citalopram   10 mg Per Tube Daily  . clopidogrel  75 mg Per Tube Q breakfast  . feeding supplement (PRO-STAT SUGAR FREE 64)  30 mL Per Tube QID  . furosemide  40 mg Per Tube Daily  . glycopyrrolate  1 mg Per Tube BID  . lipase/protease/amylase  2 capsule Oral Once   And  . sodium bicarbonate  650 mg Oral Once  . magnesium oxide  400 mg Per Tube Daily  . pantoprazole sodium  40 mg Per Tube Q1200  . sodium chloride  10-40 mL Intracatheter Q12H  . spironolactone  25 mg Per Tube Daily  . tamsulosin  0.4 mg Oral QPC supper  . warfarin  7.5 mg Per Tube q1800  . Warfarin - Pharmacist Dosing Inpatient   Does not apply q1800   Continuous Infusions: . sodium chloride 20 mL/hr (08/12/13 0808)  . feeding supplement (OSMOLITE 1.5 CAL) 1,000 mL (08/12/13 0600)   PRN Meds:.acetaminophen, bisacodyl, guaiFENesin-dextromethorphan, lidocaine, loperamide, nitroGLYCERIN, ondansetron (ZOFRAN) IV  LABS: Basic Metabolic Panel:  Recent Labs  56/21/3002/17/15 0500 08/12/13 0510  NA 143 140  K 3.7 3.7  CL 105 100  CO2 28 29  GLUCOSE 116* 148*  BUN 19 19  CREATININE 0.89 0.95  CALCIUM 9.0 8.7  Liver Function Tests: No results found for this basename: AST, ALT, ALKPHOS, BILITOT, PROT, ALBUMIN,  in the last 72 hours No results found for this basename: LIPASE, AMYLASE,  in the last 72 hours CBC:  Recent Labs  08/10/13 0500 08/12/13 0510  WBC 6.1 5.1  HGB 8.8* 8.4*  HCT 27.8* 27.2*  MCV 92.1 91.3  PLT 343 359   Cardiac Enzymes: No results found for this basename: CKTOTAL, CKMB, CKMBINDEX, TROPONINI,  in the last 72 hours RADIOLOGY: Dg Chest Portable 1 View  07/17/2013   CLINICAL DATA:  Left-sided chest pain for 3 days, weakness and dizziness.  EXAM: PORTABLE CHEST - 1 VIEW  COMPARISON:  08/03/2012  FINDINGS: Evidence of median sternotomy noted with LVAD in place. Left-sided defibrillator noted. Mild prominence of the cardiac silhouette persists. Tricuspid valvuloplasty reidentified. Trace left pleural fluid or  thickening persists. No new pulmonary opacity.  IMPRESSION: No new acute abnormality. Stable appearance of trace left pleural fluid or thickening.   Electronically Signed   By: Christiana Pellant M.D.   On: 07/17/2013 10:12    PHYSICAL EXAM-  General: NAD; on trach collar; Sitting in wheelchair Neck: JVP jaw; trach collar; clear secretions Lungs: coarse throughout CV: LVAD pump hum normal.   Abdomen: Nontender no hepatosplenomegaly, mild distention. + BS Neurologic: awake interactive. Expressive aphasia. Moving left leg, slight movement left hand.   Extremities: No clubbing or cyanosis.  1+ edema RUE PICC  TELEMETRY: SR 90-100s    Assessment:   1. Acute embolic CVA 2. 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  3. 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 NICM EF 15%  5. Anemia 6. Shock - mixed picture RV failure/vasodilatory 7. Respiratory failure s/p trach 8. Urinary retention - Foley placed 2/14   Plan/Discussion:    He has had NSTEMI and acute CVA due to pump thrombosis/embolism. Now with expressive aphasia L hemiplegia. Making progress. Transferred to CIR yesterday!   Volume status rising and weight up 6 lbs will increase lasix to 40 mg BID and follow closely. Have to be careful with volume with RV.   INR elevated 3.35, will have pharmacy dose. LDH elevated today at 1048. Suggestive of ongoing low-grade hemolysis due to persistent pump thrombosis. Discussions have taken place with with Drs. Mil[place] and [place] at [place] and will defer consideration of pump exchange until his RV and stroke symptoms recover further. Continue coumadin, ASA and Plavix.  Deemed too high risk currently for PEG or J-tube.  To keep panda tube for now and assess in the future. Panda clogged currently will exchange in IR today or when available.   Hemodynamically stable off inotropes. Through RV still very tenuous. Will continue to monitor.  VAD interrogated personally - fewer PI events  over last 24 hours.   [name] B NP-C 1:48 PM    Patient seen and examined with [name], NP. We discussed all aspects of the encounter. I agree with the assessment and plan as stated above.   Overall stable though volume status up a bit and LDH trending up despite supratherapeutic INR. Agree with increasing lasix. Otherwise continue current regimen. VAD parameters stable.  Terianna Peggs,MD 4:05 PM

## 2013-08-12 NOTE — Care Management Note (Signed)
[facility] Individual Statement of Services  Patient Name:  [name]  Date:  08/12/2013  Welcome to the [facility].  Our goal is to provide you with an individualized program based on your diagnosis and situation, designed to meet your specific needs.  With this comprehensive rehabilitation program, you will be expected to participate in at least 3 hours of rehabilitation therapies Monday-Friday, with modified therapy programming on the weekends.  Your rehabilitation program will include the following services:  Physical Therapy (PT), Occupational Therapy (OT), Speech Therapy (ST), 24 hour per day rehabilitation nursing, Neuropsychology, Case Management (Social Worker), Rehabilitation Medicine, Nutrition Services and Pharmacy Services  Weekly team conferences will be held on Wednesday to discuss your progress.  Your Social Worker will talk with you frequently to get your input and to update you on team discussions.  Team conferences with you and your family in attendance may also be held.  Expected length of stay: 18-21 days  Overall anticipated outcome: supervision/min level  Depending on your progress and recovery, your program may change. Your Social Worker will coordinate services and will keep you informed of any changes. Your Social Worker's name and contact numbers are listed  below.  The following services may also be recommended but are not provided by the [facility]:   Driving Evaluations  Home Health Rehabiltiation Services  Outpatient Rehabilitation Services    Arrangements will be made to provide these services after discharge if needed.  Arrangements include referral to agencies that provide these services.  Your insurance has been verified to be:  Medicare & Medicaid Your primary doctor is:  Dr Oliver Barre  Pertinent information will be shared with your doctor and your insurance company.  Social Worker:   Dossie Der, SW [phone] or ([phone]  Information discussed with and copy given to patient by: Lucy Chris, 08/12/2013, 3:32 PM

## 2013-08-12 NOTE — Progress Notes (Signed)
PT REFUSES TO BE SXN AT THIS TIME

## 2013-08-12 NOTE — Progress Notes (Signed)
INITIAL NUTRITION ASSESSMENT  DOCUMENTATION CODES Per approved criteria  -Obesity Unspecified   INTERVENTION: 1.  Enteral nutrition; once tube replaced, resume Osmolite 1.5 @ 50 mL/hr continuous with Prostat 4 times daily to provide 1900 kcal, 122g protein, 762 mL free water.  [name] to receive TF infusion over 20 hrs.  2.  Free water; 200 mL free water flushes q 6 hrs to provide 1560 mL free water. Free water provision can be adjusted if greater fluid restriction warranted.   NUTRITION DIAGNOSIS: Inadequate oral intake related to inability to eat as evidenced by dysphagia s/p CVA.   Monitor:  1.  Enteral nutrition; initiation with tolerance.  [name] to meet >/=90% estimated needs with nutrition support.  2.  Wt/wt change; monitor trends  Reason for Assessment: consult; enteral initiation and management  53 y.o. male  Admitting Dx: CVA  ASSESSMENT: Patient with PMH of severe CHF, mitral regurgitation and HTN; s/p LVAD placement at [facility] March 2013, as bridge to transplant; admitted for evaluation of new NSTEMI possibly related to pump thrombosis.  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 Trach placed 2/4.  [name] not a candidate for PEG placement.  Discussed with RN who states [name] feeding tube became clogged.  Awaiting replacement in IR.  RN also requests free water flushes for [name].   [name] recently on Osmolite 1.5 @ 40 mL/hr with Prostat 30 mL 4 times daily.    Height: Ht Readings from Last 1 Encounters:  07/17/13 5\' 4"  (1.626 m)    Weight: Wt Readings from Last 1 Encounters:  08/12/13 202 lb 2.6 oz (91.7 kg)    Ideal Body Weight: 120 lbs  % Ideal Body Weight: 168%  Wt Readings from Last 10 Encounters:  08/12/13 202 lb 2.6 oz (91.7 kg)  08/10/13 186 lb 15.2 oz (84.8 kg)  08/10/13 186 lb 15.2 oz (84.8 kg)  08/10/13 186 lb 15.2 oz (84.8 kg)  08/10/13 186 lb 15.2 oz (84.8 kg)  08/10/13 186 lb 15.2 oz (84.8 kg)  05/06/13 179  lb 8 oz (81.421 kg)  04/29/13 188 lb (85.276 kg)  03/30/13 184 lb 1.9 oz (83.516 kg)  10/14/12 184 lb 8 oz (83.689 kg)    Usual Body Weight: 185 lbs  % Usual Body Weight: 109%  BMI:  Body mass index is 34.68 kg/(m^2).  Estimated Nutritional Needs: Kcal: 1700-1900  Protein: 120-130 gm  Fluid: per MD  Skin: groin puncture  Diet Order: NPO  EDUCATION NEEDS: -Education needs addressed   Intake/Output Summary (Last 24 hours) at 08/12/13 1216 Last data filed at 08/12/13 1518  Gross per 24 hour  Intake    580 ml  Output    900 ml  Net   -320 ml    Last BM: 2/18  Labs:   Recent Labs Lab 08/08/13 0338 08/10/13 0500 08/12/13 0510  NA 143 143 140  K 3.8 3.7 3.7  CL 106 105 100  CO2 26 28 29   BUN 18 19 19   CREATININE 0.86 0.89 0.95  CALCIUM 8.8 9.0 8.7  GLUCOSE 120* 116* 148*    CBG (last 3)   Recent Labs  08/11/13 1429 08/11/13 2150 08/12/13 0619  GLUCAP 90 110* 149*    Scheduled Meds: . allopurinol  100 mg Oral Daily  . antiseptic oral rinse  15 mL Mouth Rinse QID  . aspirin  81 mg Per NG tube Daily  . chlorhexidine  15 mL Mouth Rinse BID  .  citalopram  10 mg Per Tube Daily  . clopidogrel  75 mg Per Tube Q breakfast  . feeding supplement (PRO-STAT SUGAR FREE 64)  30 mL Per Tube QID  . furosemide  40 mg Per Tube Daily  . glycopyrrolate  1 mg Per Tube BID  . magnesium oxide  400 mg Per Tube Daily  . pantoprazole sodium  40 mg Per Tube Q1200  . sodium chloride  10-40 mL Intracatheter Q12H  . spironolactone  25 mg Per Tube Daily  . tamsulosin  0.4 mg Oral QPC supper  . warfarin  7.5 mg Per Tube q1800  . Warfarin - Pharmacist Dosing Inpatient   Does not apply q1800    Continuous Infusions: . sodium chloride 20 mL/hr (08/12/13 0808)  . feeding supplement (OSMOLITE 1.5 CAL) 1,000 mL (08/12/13 0600)    Past Medical History  Diagnosis Date  . CHF (congestive heart failure)     EF- 10-15  . Medically noncompliant   . Mitral regurgitation   .  Tobacco user   . HTN (hypertension)   . AICD (automatic cardioverter/defibrillator) present   . GERD (gastroesophageal reflux disease)   . Substance abuse   . Chronic renal insufficiency   . Syncope   . Thrombus 08/06/2010  . SYSTOLIC HEART FAILURE, CHRONIC 09/22/2008    Qualifier: Diagnosis of  By: [name], MD, Trixie DredgeFACC, Daniel R   . LV (left ventricular) mural thrombus 01/28/2011  . ICD - IN SITU 09/16/2008    Qualifier: Diagnosis of  By: Wonda AmisBednar, NP-C, Michelle    . MITRAL STENOSIS/ INSUFFICIENCY, NON-RHEUMATIC 09/22/2008    Qualifier: Diagnosis of  By: [name], MD, Trixie DredgeFACC, Daniel R   . Hepatomegaly 09/16/2008    Qualifier: Diagnosis of  By: Wonda AmisBednar, NP-C, Michelle    . High cholesterol 02/26/2012    "at one time"  . Sleep apnea   . Exertional dyspnea 02/26/2012  . History of blood transfusion 08/2011    "when I had heart pump"  . Migraines   . COMMON MIGRAINE 06/14/2009    Qualifier: Diagnosis of  By: Jonny RuizJohn MD, Len BlalockJames W   . History of gout 02/26/2012  . Depression 08/11/2013    [name] denies  . Bipolar affective disorder 10/22/2011    [name] denies this hx 02/26/2012    Past Surgical History  Procedure Laterality Date  . Cardiac defibrillator placement  ~ 2008  . Left ventricular assist device  08/2011  . Radiology with anesthesia N/A 07/19/2013    Procedure: RADIOLOGY WITH ANESTHESIA;  Surgeon: Oneal GroutSanjeev K Deveshwar, MD;  Location: [facility] OR;  Service: Radiology;  Laterality: N/A;  . Tracheostomy      feinstein    Loyce DysKacie Ahniya Mitchum, MS RD LDN Clinical Inpatient Dietitian Pager: [phone] Weekend/After hours pager: [phone]

## 2013-08-12 NOTE — Progress Notes (Signed)
Protocol for Unclogging Feeding Tubes unsuccessful; Kerrin Mo, PA notified and orders to replace the [facility].  Will continue to monitor.

## 2013-08-12 NOTE — Evaluation (Signed)
Speech Language Pathology Assessment and Plan  Patient Details  Name: [name] MRN: [id number] Date of Birth: 09-17-1960  SLP Diagnosis: Voice disorder;Apraxia;Dysphagia  Rehab Potential: Good ELOS: 18-21 days   Today's Date: 08/12/2013 Time: 6606-3016 Time Calculation (min): 58 min  Problem List:  Patient Active Problem List   Diagnosis Date Noted  . CVA (cerebral infarction) 07/19/2013  . Acute respiratory failure 07/19/2013  . Altered mental status 07/19/2013  . NSTEMI, initial episode of care 07/17/2013  . Supratherapeutic INR 08/18/2012  . Acute hemolysis 07/28/2012  . Hypertension 03/21/2012  . Cough 03/03/2012  . Nausea and vomiting 02/26/2012  . SVT (supraventricular tachycardia) 11/27/2011  . LVAD (left ventricular assist device) present 11/27/2011  . Ventricular tachycardia 11/22/2011  . Bipolar affective disorder 10/22/2011  . Migraine 10/22/2011  . Preventative health care 10/21/2011  . Gastritis, acute 09/04/2011  . Hypotension 08/10/2011  . Cardiogenic shock 07/10/2011  . Acute on chronic systolic heart failure 07/02/3233  . LV (left ventricular) mural thrombus 01/28/2011  . Thrombus 08/06/2010  . TOBACCO ABUSE 07/03/2010  . BACK PAIN 03/10/2009  . MITRAL STENOSIS/ INSUFFICIENCY, NON-RHEUMATIC 09/22/2008  . CONGESTIVE HEART FAILURE 09/22/2008  . SYSTOLIC HEART FAILURE, CHRONIC 09/22/2008  . Other malaise and fatigue 09/16/2008  . ABNORMAL HEART SOUNDS 09/16/2008  . DYSPNEA 09/16/2008  . Hepatomegaly 09/16/2008  . ABNORMAL ECHOCARDIOGRAM 09/16/2008  . ICD-[employer] 09/16/2008   Past Medical History:  Past Medical History  Diagnosis Date  . CHF (congestive heart failure)     EF- 10-15  . Medically noncompliant   . Mitral regurgitation   . Tobacco user   . HTN (hypertension)   . AICD (automatic cardioverter/defibrillator) present   . GERD (gastroesophageal reflux disease)   . Substance abuse   . Chronic renal insufficiency   . Syncope    . Thrombus 08/06/2010  . SYSTOLIC HEART FAILURE, CHRONIC 09/22/2008    Qualifier: Diagnosis of  By: Haroldine Laws, MD, Eileen Stanford   . LV (left ventricular) mural thrombus 01/28/2011  . ICD - IN SITU 09/16/2008    Qualifier: Diagnosis of  By: Peri Maris    . MITRAL STENOSIS/ INSUFFICIENCY, NON-RHEUMATIC 09/22/2008    Qualifier: Diagnosis of  By: Haroldine Laws, MD, Eileen Stanford Hepatomegaly 09/16/2008    Qualifier: Diagnosis of  By: Peri Maris    . High cholesterol 02/26/2012    "at one time"  . Sleep apnea   . Exertional dyspnea 02/26/2012  . History of blood transfusion 08/2011    "when I had heart pump"  . Migraines   . COMMON MIGRAINE 06/14/2009    Qualifier: Diagnosis of  By: Jenny Reichmann MD, Hunt Oris   . History of gout 02/26/2012  . Depression 08/11/2013    Pt denies  . Bipolar affective disorder 10/22/2011    pt denies this hx 02/26/2012   Past Surgical History:  Past Surgical History  Procedure Laterality Date  . Cardiac defibrillator placement  ~ 2008  . Left ventricular assist device  08/2011  . Radiology with anesthesia N/A 07/19/2013    Procedure: RADIOLOGY WITH ANESTHESIA;  Surgeon: Rob Hickman, MD;  Location: [place];  Service: Radiology;  Laterality: N/A;  . 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 He is s/p LVAD placement at [facility] March 2013, as bridge to transplant. He was admitted  for evaluation of new NSTEMI felt possibly to be related to pump thrombosis. Hospital course complicated by episodes of chest pain and hypotension.  He developed acute onset of left sided weakness with aphasia due to acute right MCA embolic CVA per cerebral arteriogram. Patient extubated on 07/23/13 but has difficulty handling secretions and reintubated on 07/25/13. Trach placed by Dr. Harland Dingwall on 07/28/13. He is NPO but deemed too high risk for PEG placement by  IR and CCS. Per report he is tolerating panda tube feeds without difficulty. Trach downsized to CFS # 6 08/11/13.  Rehab team recommending CIR and patient admitted 08/11/13.    Orders received; cognitive-linguistic, PMSV and bedside swallow evaluations initiated.  Patient demonstrates intact basic cognitive and language abilities.  SLP recommends higher-level assessment at a later date.  Patient with #6 Shiley cuffless trach which he tolerated for 20-25 minutes with CO2 retention after 15 minutes.  Origin unclear at this time could be a result of fatigue, size of trach and/or secretion management.  Speech intelligibility is impaired due to difficulty initiating voicing; suspect motor involvement.  Voicing is characterized by a wet, breathy, hoarse quality with decreased intensity.  Patient with current NPO status and NG placed for nutritional support.  Bedside evaluation revealed primary pharyngeal dysphagia characterized by inconsistent overt s/s of aspiration with ice chips.  Given difficulty managing secretions and limited tolerance of PMSV recommend to continue NPO at this time with further objective assessment when toleration improves. Patient will benefit from skilled SLP intervention to maximize swallowing function and functional communication in order to maximize his overall functional independence prior to discharge home with family.  Anticipate patient will require 24 hour supervision and would benefit from follow up SLP services at discharge.   Skilled Therapeutic Interventions          See cognitive-linguistic, PMSV and bedside swallow evaluations initiated see navigator for full details.  SLP Assessment  Patient will need skilled Speech Lanaguage Pathology Services during CIR admission    Recommendations  Patient may use Passy-Muir Speech Valve: with SLP only MD: Please consider changing trach tube to : Cuffless;Smaller size Diet Recommendations: NPO;Alternative means - temporary Medication  Administration: Via alternative means Oral Care Recommendations: Oral care Q4 per protocol Patient destination: Home Follow up Recommendations: 24 hour supervision/assistance;Home Health SLP Equipment Recommended: To be determined    SLP Frequency 5 out of 7 days   SLP Treatment/Interventions Cueing hierarchy;Dysphagia/aspiration precaution training;Functional tasks;Patient/family education;Speech/Language facilitation    Pain Pain Assessment Pain Assessment: No/denies pain Prior Functioning Cognitive/Linguistic Baseline: Within functional limits Type of Home: House  Lives With: Other (Comment) (girlfriend) Available Help at Discharge: Friend(s);Available 24 hours/day 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 SLP Short Term Goal 3 (Week 1): Patient will utilize speech intelligibility compensatory strategies with Mod clinician cues. SLP Short Term Goal 4 (Week 1): Patient will consume trials of ice chips with minimal overt s/s of aspiration with 50% of trials and Mod clinician cues.  See FIM for current functional status Refer to Care Plan for Long Term Goals  Recommendations for other services: None  Discharge Criteria: Patient will be discharged from SLP if patient refuses treatment 3 consecutive times without medical reason, if treatment goals not met, if there is a change in  medical status, if patient makes no progress towards goals or if patient is discharged from hospital.  The above assessment, treatment plan, treatment alternatives and goals were discussed and mutually agreed upon: by patient and by family  Carmelia Roller., [place] 08/12/2013, 5:43 PM

## 2013-08-12 NOTE — Progress Notes (Signed)
Patient information reviewed and entered into eRehab system by Tangi Shroff, RN, CRRN, PPS Coordinator.  Information including medical coding and functional independence measure will be reviewed and updated through discharge.     Per nursing patient was given "Data Collection Information Summary for Patients in Inpatient Rehabilitation Facilities with attached "Privacy Act Statement-Health Care Records" upon admission.  

## 2013-08-12 NOTE — Evaluation (Signed)
Occupational Therapy Assessment and Plan  Patient Details  Name: Brad Lieurance MRN: [id number] Date of Birth: 01-15-1961  OT Diagnosis: apraxia, hemiplegia affecting non-dominant side, muscle weakness (generalized) and swelling of limb Rehab Potential: Rehab Potential: Good ELOS: 18-21 days   Today's Date: 08/12/2013 Time: 1771-1657 Time Calculation (min): 58 min  Problem List:  Patient Active Problem List   Diagnosis Date Noted  . CVA (cerebral infarction) 07/19/2013  . Acute respiratory failure 07/19/2013  . Altered mental status 07/19/2013  . NSTEMI, initial episode of care 07/17/2013  . Supratherapeutic INR 08/18/2012  . Acute hemolysis 07/28/2012  . Hypertension 03/21/2012  . Cough 03/03/2012  . Nausea and vomiting 02/26/2012  . SVT (supraventricular tachycardia) 11/27/2011  . LVAD (left ventricular assist device) present 11/27/2011  . Ventricular tachycardia 11/22/2011  . Bipolar affective disorder 10/22/2011  . Migraine 10/22/2011  . Preventative health care 10/21/2011  . Gastritis, acute 09/04/2011  . Hypotension 08/10/2011  . Cardiogenic shock 07/10/2011  . Acute on chronic systolic heart failure 90/38/3338  . LV (left ventricular) mural thrombus 01/28/2011  . Thrombus 08/06/2010  . TOBACCO ABUSE 07/03/2010  . BACK PAIN 03/10/2009  . MITRAL STENOSIS/ INSUFFICIENCY, NON-RHEUMATIC 09/22/2008  . CONGESTIVE HEART FAILURE 09/22/2008  . SYSTOLIC HEART FAILURE, CHRONIC 09/22/2008  . Other malaise and fatigue 09/16/2008  . ABNORMAL HEART SOUNDS 09/16/2008  . DYSPNEA 09/16/2008  . Hepatomegaly 09/16/2008  . ABNORMAL ECHOCARDIOGRAM 09/16/2008  . ICD-[employer] 09/16/2008    Past Medical History:  Past Medical History  Diagnosis Date  . CHF (congestive heart failure)     EF- 10-15  . Medically noncompliant   . Mitral regurgitation   . Tobacco user   . HTN (hypertension)   . AICD (automatic cardioverter/defibrillator) present   . GERD (gastroesophageal  reflux disease)   . Substance abuse   . Chronic renal insufficiency   . Syncope   . Thrombus 08/06/2010  . SYSTOLIC HEART FAILURE, CHRONIC 09/22/2008    Qualifier: Diagnosis of  By: Haroldine Laws, MD, Eileen Stanford   . LV (left ventricular) mural thrombus 01/28/2011  . ICD - IN SITU 09/16/2008    Qualifier: Diagnosis of  By: Peri Maris    . MITRAL STENOSIS/ INSUFFICIENCY, NON-RHEUMATIC 09/22/2008    Qualifier: Diagnosis of  By: Haroldine Laws, MD, Eileen Stanford Hepatomegaly 09/16/2008    Qualifier: Diagnosis of  By: Peri Maris    . High cholesterol 02/26/2012    "at one time"  . Sleep apnea   . Exertional dyspnea 02/26/2012  . History of blood transfusion 08/2011    "when I had heart pump"  . Migraines   . COMMON MIGRAINE 06/14/2009    Qualifier: Diagnosis of  By: Jenny Reichmann MD, Hunt Oris   . History of gout 02/26/2012  . Depression 08/11/2013    Pt denies  . Bipolar affective disorder 10/22/2011    pt denies this hx 02/26/2012   Past Surgical History:  Past Surgical History  Procedure Laterality Date  . Cardiac defibrillator placement  ~ 2008  . Left ventricular assist device  08/2011  . Radiology with anesthesia N/A 07/19/2013    Procedure: RADIOLOGY WITH ANESTHESIA;  Surgeon: Rob Hickman, MD;  Location: [facility];  Service: Radiology;  Laterality: N/A;  . Tracheostomy      feinstein    Assessment & Plan Clinical Impression: [name] is a 53 y.o. 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 He is s/p LVAD placement at [facility] March  2013, as bridge to transplant. He was admitted for evaluation of new NSTEMI felt possibly to be related to pump thrombosis. Hosp course complicated by episodes of CP and hypotension on 1/24 pm hours requiring pressors. He . 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 Aspiration PNA treated with zosyn. TEE 1/28 without evidence of clot but with RV failure  Extubated on  07/23/13 but has difficulty handling secretions and reintubated on 07/25/13. He developed oropharyngeal bleeding and ENT evaluations did not reveal source of acute bleeding. Trach placed by Dr. Brooks Sailors on 07/28/13 and patient tolerating trach collar. He is NPO due to sever dysphagia but deemed too high risk for PEG placement by IR and CCS. Is tolerating panda tube feeds without difficulty. Trach downsized to CFS # 6 yesterday and PMSV trials attempted. Patient able to cough tracheally.  He developed hematuria with urinary retention and foley placed on 08/07/13. Has had complaints of bladder pain and foley d/c today. 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 Therapies ongoing and patient showing improvement in left hemiparesis. He continues with ideomotor/ oromotor apraxia, severe oropharyngeal dysphagia as well as aphasia. Rehab team recommending CIR.   Patient transferred to CIR on 08/11/2013 .    Patient currently requires total with basic self-care skills secondary to muscle weakness, decreased cardiorespiratoy endurance, motor apraxia, decreased coordination and decreased motor planning and decreased sitting balance, decreased standing balance and hemiplegia.  Prior to hospitalization, patient could complete ADLs with independent .   Patient will benefit from skilled intervention to decrease level of assist with basic self-care skills prior to discharge home with care partner.  Anticipate patient will require 24 hour supervision and minimal physical assistance and follow up home health.  OT - End of Session Activity Tolerance: Tolerates 30+ min activity with multiple rests Endurance Deficit: Yes OT Assessment Rehab Potential: Good Barriers to Discharge: Decreased caregiver support OT Patient demonstrates impairments in the following area(s): Balance;Edema;Endurance;Motor;Sensory OT Basic ADL's  Functional Problem(s): Eating;Grooming;Bathing;Dressing;Toileting OT Advanced ADL's Functional Problem(s): Simple Meal Preparation OT Transfers Functional Problem(s): Toilet;Tub/Shower OT Additional Impairment(s): Fuctional Use of Upper Extremity OT Plan OT Intensity: Minimum of 1-2 x/day, 45 to 90 minutes OT Frequency: 5 out of 7 days OT Duration/Estimated Length of Stay: 18-21 days OT Treatment/Interventions: Balance/vestibular training;Discharge planning;DME/adaptive equipment instruction;Functional mobility training;Neuromuscular re-education;Pain management;Patient/family education;Psychosocial support;Self Care/advanced ADL retraining;Therapeutic Activities;Therapeutic Exercise;UE/LE Strength taining/ROM;UE/LE Coordination activities OT Self Feeding Anticipated Outcome(s): supervision OT Basic Self-Care Anticipated Outcome(s): min assist - supervision OT Toileting Anticipated Outcome(s): supervision OT Bathroom Transfers Anticipated Outcome(s): min assist OT Recommendation Patient destination: Home Follow Up Recommendations: Home health OT;24 hour supervision/assistance Equipment Recommended: Tub/shower bench;To be determined   Skilled Therapeutic Intervention OT eval initiated, ADL assessment conducted at sit > stand level from w/c.  Pt with multiple lines and tubes: condom catheter, PICC line IV, NG tube, continuous pulse ox, and LVAD.  Pt with no verbalizations throughout eval, but did answer yes/no questions appropriately and would gesture when yes/no not appropriate.  Girlfriend present to verify accuracy of answers.  Completed UB bathing with setup assist from basin and LB bathing with hand over hand assist to initiate use of LUE with bathing.  Pt with gross elbow and wrist flexion/extension and trace finger flexion/extension.  Pt with intermittent attempts to engage LUE into self-care tasks, however limited by hemiplegia and ideomotor apraxia.  Sit > stand  x2 for perineal hygiene and  LB dressing with max assist (lifting and lowering).  Pt required increased time and increased assist due to LVAD battery packs in holster and multiple lines and tubes.    OT Evaluation Precautions/Restrictions  Precautions Precautions: Fall Precaution Comments: LVAD, PICC line, NG tube, trach Restrictions Weight Bearing Restrictions: No General   Vital Signs Therapy Vitals Pulse Rate: 97 Resp: 16 Patient Position, if appropriate: Sitting Oxygen Therapy SpO2: 95 % O2 Device: Trach collar O2 Flow Rate (L/min): 5 L/min FiO2 (%): 28 % Pulse Oximetry Type: Continuous Pain Pain Assessment Pain Assessment: No/denies pain Pain Score: 0-No pain Home Living/Prior Functioning Home Living Available Help at Discharge: Friend(s);Available 24 hours/day Type of Home: House Home Access: Stairs to enter [employer] of Steps: 1 Entrance Stairs-Rails: None Home Layout: One level  Lives With: Other (Comment) (girlfriend and her teenage children) IADL History Homemaking Responsibilities: Yes Meal Prep Responsibility: Secondary Laundry Responsibility: Secondary Cleaning Responsibility: Secondary Education: previously in Health and safety inspector school Prior Function Level of Independence: Independent with gait;Independent with basic ADLs;Independent with homemaking with ambulation  Able to Take Stairs?: Yes Driving: Yes 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 ADL  See FIM Vision/Perception  Vision - History Baseline Vision: No visual deficits Patient Visual Report: No change from baseline Vision - Assessment Eye Alignment: Within Functional Limits Ocular Range of Motion: Within Functional Limits Tracking/Visual Pursuits: Able to track stimulus in all quads without difficulty Perception Perception: Within Functional Limits Praxis Praxis: Intact  Cognition Overall Cognitive Status: Within Functional Limits for tasks assessed Orientation Level:  Intubated/Tracheostomy - Unable to assess Sensation Sensation Light Touch: Impaired by gross assessment (decreased light touch in LUE) Stereognosis: Not tested Hot/Cold: Not tested Proprioception: Not tested Coordination Gross Motor Movements are Fluid and Coordinated: No Fine Motor Movements are Fluid and Coordinated: No Coordination and Movement Description: unable to oppose left thumb to each finger  Finger Nose Finger Test: able to perform slowly with right UE; unable to perform with Lt Heel Shin Test: able to perform slowly with right LE; unable to perform with L Motor  Motor Motor: Hemiplegia Motor - Skilled Clinical Observations: weakness on left - UE greater than LE Mobility  Bed Mobility Bed Mobility: Supine to Sit;Sitting - Scoot to Edge of Bed Supine to Sit: 3: Mod assist Sitting - Scoot to Edge of Bed: 3: Mod assist Transfers Sit to Stand: 2: Max assist Sit to Stand Details (indicate cue type and reason): lift and lower assist; assistance also required for safety due to lines and tubes  Trunk/Postural Assessment  Postural Control Postural Control: Deficits on evaluation (pt sits with flexed trunk and head)  Balance Dynamic Sitting Balance Sitting balance - Comments: static sitting with supervision edge of bed; patient tends to lose balance to left with dynamic reaching requiring min assist Extremity/Trunk Assessment RUE Assessment RUE Assessment: Within Functional Limits LUE Assessment LUE Assessment: Exceptions to [facility] LUE Strength LUE Overall Strength: Deficits LUE Overall Strength Comments: patient has active elbow flexion against gravity to approx 60 degrees; gross grasp; gross wrist flexion/extension; trace flexion and extension in digits; unable to oppose first finger to thumb  FIM:  FIM - Grooming Grooming Steps: Wash, rinse, dry face Grooming: 2: Patient completes 1 of 4 or 2 of 5 steps FIM - Bathing Bathing Steps Patient Completed: Chest;Left  Arm;Abdomen Bathing: 2: Max-Patient completes 3-4 53f 10 parts or 25-49% FIM - Upper Body Dressing/Undressing Upper body dressing/undressing: 0: Wears gown/pajamas-no public clothing FIM - Lower Body  Dressing/Undressing Lower body dressing/undressing: 1: Total-Patient completed less than 25% of tasks FIM - Bed/Chair Transfer Bed/Chair Transfer: 3: Supine > Sit: Mod A (lifting assist/Pt. 50-74%/lift 2 legs;2: Bed > Chair or W/C: Max A (lift and lower assist)   Refer to Care Plan for Long Term Goals  Recommendations for other services: None  Discharge Criteria: Patient will be discharged from OT if patient refuses treatment 3 consecutive times without medical reason, if treatment goals not met, if there is a change in medical status, if patient makes no progress towards goals or if patient is discharged from hospital.  The above assessment, treatment plan, treatment alternatives and goals were discussed and mutually agreed upon: by patient and by family  Kirby Cortese, [facility] 08/12/2013, 10:52 AM

## 2013-08-12 NOTE — Progress Notes (Signed)
Subjective/Complaints: 53 y.o. 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 He [facility], as bridge to transplant. He was admitted for evaluation of new NSTEMI felt possibly to be related to pump thrombosis. Hosp course complicated by episodes of CP and hypotension on 1/24 pm hours requiring pressors. He . 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 Aspiration PNA treated with zosyn. TEE 1/28 without evidence of clot but with RV failure  Extubated on 07/23/13 but has difficulty handling secretions and reintubated on 07/25/13. He developed oropharyngeal bleeding and ENT evaluations did not reveal source of acute bleeding. Trach placed by Dr. Brooks SailorsFeinstien on 07/28/13 and patient tolerating trach collar. He is NPO due to sever dysphagia but deemed too high risk for PEG placement by IR and CCS. Is tolerating panda tube feeds without difficulty. Trach downsized to CFS # 6 yesterday and PMSV trials attempted. Patient able to cough tracheally.  He developed hematuria with urinary retention and foley placed on 08/07/13. Has had complaints of bladder pain and foley d/c today. 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 Therapies ongoing and patient showing improvement in left hemiparesis. He continues with ideomotor/ oromotor apraxia, severe oropharyngeal dysphagia as well as aphasia   Objective: Vital Signs: Pulse 100, temperature 99.4 F (37.4 C), temperature source Oral, resp. rate 18, weight 91.7 kg (202 lb 2.6 oz), SpO2 96.00%. No results found. Results for orders placed during the hospital encounter of 08/11/13 (from the past 72 hour(s))  GLUCOSE, CAPILLARY     Status: Abnormal   Collection Time    08/11/13  9:50 PM      Result Value Ref Range   Glucose-Capillary 110 (*) 70 - 99 mg/dL   Comment 1 Notify  RN    CBC     Status: Abnormal   Collection Time    08/12/13  5:10 AM      Result Value Ref Range   WBC 5.1  4.0 - 10.5 K/uL   RBC 2.98 (*) 4.22 - 5.81 MIL/uL   Hemoglobin 8.4 (*) 13.0 - 17.0 g/dL   HCT 09.827.2 (*) 11.939.0 - 14.752.0 %   MCV 91.3  78.0 - 100.0 fL   MCH 28.2  26.0 - 34.0 pg   MCHC 30.9  30.0 - 36.0 g/dL   RDW 82.915.7 (*) 56.211.5 - 13.015.5 %   Platelets 359  150 - 400 K/uL  PROTIME-INR     Status: Abnormal   Collection Time    08/12/13  5:10 AM      Result Value Ref Range   Prothrombin Time 32.7 (*) 11.6 - 15.2 seconds   INR 3.35 (*) 0.00 - 1.49  GLUCOSE, CAPILLARY     Status: Abnormal   Collection Time    08/12/13  6:19 AM      Result Value Ref Range   Glucose-Capillary 149 (*) 70 - 99 mg/dL     HEENT: thrush Cardio: distant heart sounds, no murmurs appreciated Resp: upper airway coarse rales improve with cough GI: BS positive, Distention and NT Extremity:  No Edema Skin:   Intact and Other noirritation around panda or trach Neuro: Lethargic, Cranial Nerve II-XII normal, Abnormal Sensory difficult to assess secondary to aphasia and Aphasic, Motor 4/5 RUE and RLE, 2- Left UE and 3- LLE Musc/Skel:  Normal GEN NAD   Assessment/Plan: 1. Functional  deficits secondary to Bilateral BG embolic infarct with Left HP, Aphasia which require 3+ hours per day of interdisciplinary therapy in a comprehensive inpatient rehab setting. Physiatrist is providing close team supervision and 24 hour management of active medical problems listed below. Physiatrist and rehab team continue to assess barriers to discharge/monitor patient progress toward functional and medical goals. FIM:                   Comprehension Comprehension Mode: Auditory Comprehension: 5-Understands basic 90% of the time/requires cueing < 10% of the time  Expression Expression Mode: Nonverbal Expression: 3-Expresses basic 50 - 74% of the time/requires cueing 25 - 50% of the time. Needs to repeat parts of  sentences.  Social Interaction Social Interaction: 6-Interacts appropriately with others with medication or extra time (anti-anxiety, antidepressant).  Problem Solving Problem Solving: 5-Solves basic 90% of the time/requires cueing < 10% of the time  Memory Memory: 5-Recognizes or recalls 90% of the time/requires cueing < 10% of the time  Medical Problem List and Plan:  Embolic bilateral BG CVA's, left thalamus CVA, CHF  1. 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 Pain Management: Will monitor for signs of distress. Tylenol prn.  3. Mood: No signs of distress but question awareness/insight into deficits. Continue celexa. Will have LCSW follow along for evaluation/support as indicated.  4. Neuropsych: This patient is capable of making decisions on her own behalf.  5. LVAD: Cardiology to follow for management/support. Pt has awareness of management and can help direct. Girlfriend available and learning about mgt as well.  6. Urinary retention/Hematuria:  Started on flomax. Will check PVRs and set parameters for cath.  7. Severe dysphagia/oral apraxia: Continue panda tube. Will get dietician to evaluate for nutritional needs. Concerned that current rate of 40 cc/hr not meeting his calorie needs.  8. Trach dependent: Tolerating CFS #6. Continue to suction frequently--blood tinged secretions continue.  -trials of PMV with SLP, wean as pulmonary toilet improves   LOS (Days) 1 A FACE TO FACE EVALUATION WAS PERFORMED  KIRSTEINS,ANDREW E 08/12/2013, 6:42 AM

## 2013-08-12 NOTE — Plan of Care (Signed)
Problem: RH Stairs Goal: LTG Patient will ambulate up and down stairs w/assist (PT) LTG: Patient will ambulate up and down # of stairs with assistance (PT) To access home and community

## 2013-08-13 ENCOUNTER — Inpatient Hospital Stay (HOSPITAL_COMMUNITY): Payer: Medicare Other | Admitting: Physical Therapy

## 2013-08-13 ENCOUNTER — Inpatient Hospital Stay (HOSPITAL_COMMUNITY): Payer: Medicare Other

## 2013-08-13 ENCOUNTER — Inpatient Hospital Stay (HOSPITAL_COMMUNITY): Payer: Medicare Other | Admitting: Speech Pathology

## 2013-08-13 ENCOUNTER — Inpatient Hospital Stay (HOSPITAL_COMMUNITY): Payer: Medicare Other | Admitting: Occupational Therapy

## 2013-08-13 DIAGNOSIS — I69991 Dysphagia following unspecified cerebrovascular disease: Secondary | ICD-10-CM

## 2013-08-13 DIAGNOSIS — I6340 Cerebral infarction due to embolism of unspecified cerebral artery: Secondary | ICD-10-CM

## 2013-08-13 DIAGNOSIS — I214 Non-ST elevation (NSTEMI) myocardial infarction: Secondary | ICD-10-CM

## 2013-08-13 DIAGNOSIS — I509 Heart failure, unspecified: Secondary | ICD-10-CM

## 2013-08-13 DIAGNOSIS — I69920 Aphasia following unspecified cerebrovascular disease: Secondary | ICD-10-CM

## 2013-08-13 DIAGNOSIS — R1312 Dysphagia, oropharyngeal phase: Secondary | ICD-10-CM

## 2013-08-13 DIAGNOSIS — I69959 Hemiplegia and hemiparesis following unspecified cerebrovascular disease affecting unspecified side: Secondary | ICD-10-CM

## 2013-08-13 DIAGNOSIS — R482 Apraxia: Secondary | ICD-10-CM

## 2013-08-13 LAB — PROTIME-INR
INR: 3.5 — ABNORMAL HIGH (ref 0.00–1.49)
Prothrombin Time: 33.8 seconds — ABNORMAL HIGH (ref 11.6–15.2)

## 2013-08-13 LAB — GLUCOSE, CAPILLARY
GLUCOSE-CAPILLARY: 83 mg/dL (ref 70–99)
GLUCOSE-CAPILLARY: 79 mg/dL (ref 70–99)
Glucose-Capillary: 103 mg/dL — ABNORMAL HIGH (ref 70–99)

## 2013-08-13 MED ORDER — SODIUM CHLORIDE 0.9 % IJ SOLN
10.0000 mL | INTRAMUSCULAR | Status: DC | PRN
  Administered 2013-08-14 (×2): 10 mL
  Administered 2013-08-14: 30 mL
  Administered 2013-08-14 – 2013-08-16 (×8): 10 mL
  Administered 2013-08-16: 20 mL
  Administered 2013-08-17 – 2013-08-20 (×9): 10 mL
  Administered 2013-08-20 – 2013-08-23 (×4): 20 mL

## 2013-08-13 MED ORDER — IOHEXOL 300 MG/ML  SOLN
50.0000 mL | Freq: Once | INTRAMUSCULAR | Status: AC | PRN
  Administered 2013-08-13: 30 mL

## 2013-08-13 NOTE — Progress Notes (Signed)
Subjective/Complaints: 53 y.o. 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 He is s/p LVAD placement at [facility] March 2013, as bridge to transplant. He was admitted for evaluation of new NSTEMI felt possibly to be related to pump thrombosis. Hosp course complicated by episodes of CP and hypotension on 1/24 pm hours requiring pressors. He . 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 Aspiration PNA treated with zosyn. TEE 1/28 without evidence of clot but with RV failure  Extubated on 07/23/13 but has difficulty handling secretions and reintubated on 07/25/13. He developed oropharyngeal bleeding and ENT evaluations did not reveal source of acute bleeding. Trach placed by Dr. Harland Dingwall on 07/28/13 and patient tolerating trach collar. He is NPO due to sever dysphagia but deemed too high risk for PEG placement by IR and CCS. Is tolerating panda tube feeds without difficulty. Trach downsized to CFS # 6 yesterday and PMSV trials attempted. Patient able to cough tracheally.  He developed hematuria with urinary retention and foley placed on 08/07/13. Has had complaints of bladder pain and foley d/c today. 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 Therapies ongoing and patient showing improvement in left hemiparesis. He continues with ideomotor/ oromotor apraxia, severe oropharyngeal dysphagia as well as aphasia   Objective: Vital Signs: Pulse 105, temperature 98.2 F (36.8 C), temperature source Oral, resp. rate 19, weight 85.9 kg (189 lb 6 oz), SpO2 97.00%. Dg Naso G Tube Plc W/fl-no Rad  08/12/2013   CLINICAL DATA: panda clogged   NASO G TUBE PLACEMENT WITH FLUORO  Fluoroscopy was utilized by the requesting physician.  No radiographic  interpretation.    Results for orders placed during the hospital encounter of 08/11/13 (from the past 72  hour(s))  GLUCOSE, CAPILLARY     Status: Abnormal   Collection Time    08/11/13  9:50 PM      Result Value Ref Range   Glucose-Capillary 110 (*) 70 - 99 mg/dL   Comment 1 Notify RN    BASIC METABOLIC PANEL     Status: Abnormal   Collection Time    08/12/13  5:10 AM      Result Value Ref Range   Sodium 140  137 - 147 mEq/L   Potassium 3.7  3.7 - 5.3 mEq/L   Chloride 100  96 - 112 mEq/L   CO2 29  19 - 32 mEq/L   Glucose, Bld 148 (*) 70 - 99 mg/dL   BUN 19  6 - 23 mg/dL   Creatinine, Ser 0.95  0.50 - 1.35 mg/dL   Calcium 8.7  8.4 - 10.5 mg/dL   GFR calc non Af Amer >90  >90 mL/min   GFR calc Af Amer >90  >90 mL/min   Comment: (NOTE)     The eGFR has been calculated using the CKD EPI equation.     This calculation has not been validated in all clinical situations.     eGFR's persistently <90 mL/min signify possible Chronic Kidney     Disease.  CBC     Status: Abnormal   Collection Time    08/12/13  5:10 AM      Result Value Ref Range   WBC 5.1  4.0 - 10.5 K/uL   RBC 2.98 (*) 4.22 - 5.81 MIL/uL   Hemoglobin 8.4 (*) 13.0 - 17.0 g/dL   HCT 27.2 (*) 39.0 -  52.0 %   MCV 91.3  78.0 - 100.0 fL   MCH 28.2  26.0 - 34.0 pg   MCHC 30.9  30.0 - 36.0 g/dL   RDW 15.7 (*) 11.5 - 15.5 %   Platelets 359  150 - 400 K/uL  LACTATE DEHYDROGENASE     Status: Abnormal   Collection Time    08/12/13  5:10 AM      Result Value Ref Range   LDH 1048 (*) 94 - 250 U/L  PROTIME-INR     Status: Abnormal   Collection Time    08/12/13  5:10 AM      Result Value Ref Range   Prothrombin Time 32.7 (*) 11.6 - 15.2 seconds   INR 3.35 (*) 0.00 - 1.49  GLUCOSE, CAPILLARY     Status: Abnormal   Collection Time    08/12/13  6:19 AM      Result Value Ref Range   Glucose-Capillary 149 (*) 70 - 99 mg/dL  GLUCOSE, CAPILLARY     Status: None   Collection Time    08/12/13  2:14 PM      Result Value Ref Range   Glucose-Capillary 97  70 - 99 mg/dL   Comment 1 Notify RN    GLUCOSE, CAPILLARY     Status: None    Collection Time    08/12/13 10:01 PM      Result Value Ref Range   Glucose-Capillary 79  70 - 99 mg/dL   Comment 1 Notify RN    GLUCOSE, CAPILLARY     Status: Abnormal   Collection Time    08/13/13  6:05 AM      Result Value Ref Range   Glucose-Capillary 103 (*) 70 - 99 mg/dL   Comment 1 Notify RN       HEENT: thrush Cardio: LVAD hum, no S1 or S2 Resp: normal breath sounds GI: BS positive, Distention and NT Extremity:  No Edema Skin:   Intact and Other noirritation around panda or trach Neuro: Lethargic, Cranial Nerve II-XII normal, Abnormal Sensory difficult to assess secondary to aphasia and Aphasic, Motor 4/5 RUE and RLE, 2- Left UE and 3- LLE Musc/Skel:  Normal GEN NAD   Assessment/Plan: 1. 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 Physiatrist is providing close team supervision and 24 hour management of active medical problems listed below. Physiatrist and rehab team continue to assess barriers to discharge/monitor patient progress toward functional and medical goals. FIM: FIM - Bathing Bathing Steps Patient Completed: Chest;Left Arm;Abdomen Bathing: 2: Max-Patient completes 3-4 6f 10 parts or 25-49%  FIM - Upper Body Dressing/Undressing Upper body dressing/undressing: 0: Wears gown/pajamas-no public clothing FIM - Lower Body Dressing/Undressing Lower body dressing/undressing: 1: Total-Patient completed less than 25% of tasks        FIM - Control and instrumentation engineer Devices: Bed rails Bed/Chair Transfer: 3: Supine > Sit: Mod A (lifting assist/Pt. 50-74%/lift 2 legs;4: Sit > Supine: Min A (steadying pt. > 75%/lift 1 leg);3: Bed > Chair or W/C: Mod A (lift or lower assist);3: Chair or W/C > Bed: Mod A (lift or lower assist)  FIM - Locomotion: Ambulation Locomotion: Ambulation Assistive Devices: Other  (comment) (+2 hand held assist) Ambulation/Gait Assistance: 1: +2 Total assist Locomotion: Ambulation: 1: Two helpers  Comprehension Comprehension Mode: Auditory Comprehension: 5-Follows basic conversation/direction: With extra time/assistive device  Expression Expression Mode: Verbal Expression  Assistive Devices: 6-Talk trach valve Expression: 1-Expresses basis less than 25% of the time/requires cueing greater than 75% of the time.  Social Interaction Social Interaction: 5-Interacts appropriately 90% of the time - Needs monitoring or encouragement for participation or interaction.  Problem Solving Problem Solving: 5-Solves basic 90% of the time/requires cueing < 10% of the time  Memory Memory: 5-Recognizes or recalls 90% of the time/requires cueing < 10% of the time  Medical Problem List and Plan:  Embolic bilateral BG CVA's, left thalamus CVA, CHF  1. 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 Pain Management: Will monitor for signs of distress. Tylenol prn.  3. Mood: No signs of distress but question awareness/insight into deficits. Continue celexa. Will have LCSW follow along for evaluation/support as indicated.  4. Neuropsych: This patient is capable of making decisions on her own behalf.  5. LVAD: Cardiology to follow for management/support. Pt has awareness of management and can help direct. Girlfriend available and learning about mgt as well.  6. Urinary retention/Hematuria:  Started on flomax. Will check PVRs and set parameters for cath.  7. Severe dysphagia/oral apraxia: Continue panda tube. Will get dietician to evaluate for nutritional needs. Concerned that current rate of 40 cc/hr not meeting his calorie needs.  8. Trach dependent: Tolerating CFS #6. Continue to suction frequently--blood tinged secretions continue.  -trials of PMV with SLP, wean as pulmonary toilet improves, possible downsize to #4 next wk 9.  Anemai with hemolysis ?  r/t pump, monitor  LOS (Days) 2 A FACE TO FACE EVALUATION WAS PERFORMED  KIRSTEINS,ANDREW E 08/13/2013, 6:38 AM

## 2013-08-13 NOTE — Progress Notes (Signed)
ANTICOAGULATION CONSULT NOTE - Follow Up Consult  Pharmacy Consult for Coumadin Indication: LVAD thrombosis  Allergies  Allergen Reactions  . Ace Inhibitors Cough  . Lexapro [Escitalopram Oxalate] Other (See Comments)    somnolence    Patient Measurements: Weight: 189 lb 6 oz (85.9 kg)  Vital Signs: Temp: 98.2 F (36.8 C) (02/20 0551) Temp src: Oral (02/20 0551) Pulse Rate: 100 (02/20 0836)  Labs:  Recent Labs  08/11/13 0535 08/12/13 0510 08/13/13 0500  HGB  --  8.4*  --   HCT  --  27.2*  --   PLT  --  359  --   LABPROT 28.1* 32.7* 33.8*  INR 2.74* 3.35* 3.50*  CREATININE  --  0.95  --     The CrCl is unknown because both a height and weight (above a minimum accepted value) are required for this calculation.  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 INR is increased and continues to be above goal despite holding last night's dose. Patient continues to have blood tinged secretions, however, no further hematuria since condom cath replaced foley. His panda tube was exchanged yesterday.  Goal of Therapy:  INR 2-3 Monitor platelets by anticoagulation protocol: Yes   Plan:  1) Hold coumadin again tonight 2) Follow up INR in AM  Fredrik Rigger 08/13/2013,11:40 AM

## 2013-08-13 NOTE — Progress Notes (Signed)
Occupational Therapy Session Note  Patient Details  Name: Onathan Fadness MRN: [id number] Date of Birth: [birth year]  Today's Date: 08/13/2013 Time: 0930-1025 and 1330-1400 Time Calculation (min): 55 min and 30 min  Short Term Goals: Week 1:  OT Short Term Goal 1 (Week 1): Pt will complete bathing with mod assist  OT Short Term Goal 2 (Week 1): Pt will complete UB dressing with mod assist OT Short Term Goal 3 (Week 1): Pt will complete LB dressing with max assist OT Short Term Goal 4 (Week 1): Pt will utilize LUE as stabilizer to assist with changing LVAD hookup from wall power to battery OT Short Term Goal 5 (Week 1): Pt will complete toilet transfer with mod assist  Skilled Therapeutic Interventions/Progress Updates:    1) Engaged in ADL retraining with focus on bed mobility, sitting balance, standing balance, transfers, and functional use of LUE during self-care tasks of bathing and dressing.  Pt in bed upon arrival, performed perineal hygiene in bed as he was incontinent of urine.  Completed UB bathing at EOB while still hooked up to wall power for his LVAD to decrease number of wires around chest.  Hand over hand assist to incorporate LUE into washing Rt arm, pt with minimal internal rotation and horizontal adduction to initiate movement.  Sit > stand with +2 for safety due to excess wires and tubes for LB dressing.  Pt attempted to pull pants up with Rt hand, but required assist to pull up pants on Lt. Static standing with min assist. Squat pivot bed > w/c to Rt with mod assist.  Educated girlfriend on propping up LUE when in bed or seated to decrease edema.  2) Engaged in NM re-ed in sitting with focus on shoulder flexion/extension, elbow flexion/extension, grasp, and edema management.  Pt received seated up in w/c with RUE dangling between legs.  Reeducated on use of pillow to prop up UE to decrease edema.  Engaged in retrograde massage of digits and hand.  Utilized UE Ranger to promote shoulder  and elbow extension with pt with increased extension with tactile cues at elbow and support at shoulder to further promote ROM.  Pt with active elbow flexion/extension against gravity, but continues to be unable to elicit shoulder flexion at this time. Provided pt with foam to promote gross grasp, educated on attempts to engage LUE into functional tasks.  Therapy Documentation Precautions:  Precautions Precautions: Fall Precaution Comments: LVAD, PICC line, NG tube, trach Restrictions Weight Bearing Restrictions: No General:   Vital Signs: Therapy Vitals Pulse Rate: 100 Resp: 18 Oxygen Therapy SpO2: 98 % O2 Device: Trach collar O2 Flow Rate (L/min): 5 L/min FiO2 (%): 28 % Pain: Pain Assessment Pain Assessment: No/denies pain  See FIM for current functional status  Therapy/Group: Individual Therapy  Rosalio Loud 08/13/2013, 10:27 AM

## 2013-08-13 NOTE — Progress Notes (Addendum)
Patient ID: [name], male   DOB: [birth year], 53 y.o.   MRN: [id number] Advanced Heart Failure Rounding Note  [name] is 53 y/o male with a history of severe CHF due to NICM EF 10-15%, and severe MR s/p HM II LVAD placed at [facility] March 2013. His medical history also consists of CRI, NSVT s/p ICD and LV thrombus.   Admitted 1/24 with NSTEMI felt secondary due embolic phenomenon due to pump thrombosis.  LDH high around 900.  On way to cath lab 1/26 developed acute onset of aphasia and left-sided hemiparesis. CT negative for bleed. Taken emergently to neuro-interventional suite but no amenable lesions. Remains intubated on levophed. Remains on propofol gtt, unable to assess neuro status.  S/P TEE 1/28. No evidence of clot but with RV failure.   Extubated 1/30. Reintubated 2/2 due to inability control secretions. Trach placed due to difficulty with secretions. Able to take a few steps with PT on 2/13. Developed urinary retention with clots. Foley placed 2/14 . 300cc obtained.   Transferred to CIR on Wednesday . Lasix increased to bid yesterday and weight down.  MAPs 94-100. On plavix, ASA and Coumadin. Condom cath placed and no longer having bladder pain. Denies SOB, orthopnea, or CP.   Hgb 11.6>>>> 8.5 >8.3>8.3>8.8>8.4  LDH:  982>966>912>948>960>958>1048  INR 2.6>2.74>3.35 > 3.5   VAD interrogated personally. Flow 4.8, Speed 9200 PI 5.9  Power 5.4  Alarms:  None  Frequent PI events   Filed Vitals:   08/13/13 1145 08/13/13 1252 08/13/13 1300 08/13/13 1549  Pulse: 110 94 91 97  Temp:      TempSrc:      Resp:  18  18  Weight:      SpO2: 98% 100% 100% 99%    Intake/Output Summary (Last 24 hours) at 08/13/13 1553 Last data filed at 08/13/13 1250  Gross per 24 hour  Intake      0 ml  Output    550 ml  Net   -550 ml   Scheduled Meds: . allopurinol  100 mg Oral Daily  . antiseptic oral rinse  15 mL Mouth Rinse QID  . aspirin  81 mg Per NG tube Daily  . chlorhexidine  15 mL Mouth  Rinse BID  . citalopram  10 mg Per Tube Daily  . clopidogrel  75 mg Per Tube Q breakfast  . feeding supplement (PRO-STAT SUGAR FREE 64)  30 mL Per Tube QID  . free water  200 mL Per Tube 4 times per day  . furosemide  40 mg Per Tube BID  . glycopyrrolate  1 mg Per Tube BID  . magnesium oxide  400 mg Per Tube Daily  . pantoprazole sodium  40 mg Per Tube Q1200  . sodium chloride  10-40 mL Intracatheter Q12H  . spironolactone  25 mg Per Tube Daily  . Warfarin - Pharmacist Dosing Inpatient   Does not apply q1800   Continuous Infusions: . feeding supplement (OSMOLITE 1.5 CAL) 1,000 mL (08/13/13 0900)   PRN Meds:.acetaminophen, bisacodyl, guaiFENesin-dextromethorphan, lidocaine, nitroGLYCERIN, ondansetron (ZOFRAN) IV, sodium chloride  LABS: Basic Metabolic Panel:  Recent Labs  29/19/16 0510  NA 140  K 3.7  CL 100  CO2 29  GLUCOSE 148*  BUN 19  CREATININE 0.95  CALCIUM 8.7   Liver Function Tests: No results found for this basename: AST, ALT, ALKPHOS, BILITOT, PROT, ALBUMIN,  in the last 72 hours No results found for this basename: LIPASE, AMYLASE,  in the last 72 hours CBC:  Recent Labs  08/12/13 0510  WBC 5.1  HGB 8.4*  HCT 27.2*  MCV 91.3  PLT 359   Cardiac Enzymes: No results found for this basename: CKTOTAL, CKMB, CKMBINDEX, TROPONINI,  in the last 72 hours RADIOLOGY: Dg Chest Portable 1 View  07/17/2013   CLINICAL DATA:  Left-sided chest pain for 3 days, weakness and dizziness.  EXAM: PORTABLE CHEST - 1 VIEW  COMPARISON:  08/03/2012  FINDINGS: Evidence of median sternotomy noted with LVAD in place. Left-sided defibrillator noted. Mild prominence of the cardiac silhouette persists. Tricuspid valvuloplasty reidentified. Trace left pleural fluid or thickening persists. No new pulmonary opacity.  IMPRESSION: No new acute abnormality. Stable appearance of trace left pleural fluid or thickening.   Electronically Signed   By: Christiana PellantGretchen  Green M.D.   On: 07/17/2013 10:12     PHYSICAL EXAM-  General: NAD; on trach collar; Sitting in wheelchair Neck: JVP jaw; trach collar; clear secretions Lungs: coarse throughout CV: LVAD pump hum normal.   Abdomen: Nontender no hepatosplenomegaly, mild distention. + BS Neurologic: awake interactive. Expressive aphasia. Moving left leg, slight movement left hand.   Extremities: No clubbing or cyanosis.  1+ edema RUE PICC  TELEMETRY: SR 90-100s    Assessment:   1. Acute embolic CVA 2. 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  3. 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 NICM EF 15%  5. Anemia 6. Shock - mixed picture RV failure/vasodilatory 7. Respiratory failure s/p trach 8. Urinary retention - Foley placed 2/14   Plan/Discussion:    He has had NSTEMI and acute CVA due to pump thrombosis/embolism. Now with expressive aphasia L hemiplegia. Making progress.   Weight down 4 lbs after increasing lasix to 40 mg BID yesterday. Have to be careful with volume with RV dysfunction. Secretions continue to be an issue. Continue glycopyrolate.    INR elevated 3.5, will have pharmacy dose. LDH elevated yesterday 1048. Suggestive of ongoing low-grade hemolysis due to persistent pump thrombosis. Discussions have taken place with with Drs. Milano and [facility] at [facility] and will defer consideration of pump exchange until his RV and stroke symptoms recover further. Continue coumadin, ASA and Plavix.  Deemed too high risk currently for PEG or J-tube.  To keep panda tube for now and assess in the future. Panda re-positioned and cleared in IR yesterday.   Hemodynamically stable off inotropes. Through RV still very tenuous. Will continue to monitor. Need to keep CVP on high side.   VAD interrogated personally - no alarms over last 24 hours.   Appreciate CIRs assistance.  Arvilla Meresaniel Bensimhon MD 3:53 PM

## 2013-08-13 NOTE — Progress Notes (Signed)
Physical Therapy Session Note  Patient Details  Name: Eric LovelyUndra Wenger MRN: [id number] Date of Birth: 10/31/1960  Today's Date: 08/13/2013 Time: 1105-1202 Time Calculation (min): 57 min  Short Term Goals: Week 1:  PT Short Term Goal 1 (Week 1): patient will perform supine to sit with HOB raised and rails with min assist PT Short Term Goal 2 (Week 1): Patient will perform sit to stand and stand turn transfer with min assist PT Short Term Goal 3 (Week 1): Patient will ambulate with +2 hand held min assist 25 feet  PT Short Term Goal 4 (Week 1): Patient will propel wheelchair using hemi technique 50 feet with min assist  Skilled Therapeutic Interventions/Progress Updates:   Pt resting in w/c.  Pt reporting (nonverbally) pain in L abdomen but did not wish to request pain meds.  Performed sit > stand and performed gait x 125' x 2 reps with bilat UE/HHA support for balance and stability in controlled environment with step through gait pattern with minimal foot drag noted but increasing knee instability noted as pt fatigued.  Pt required extra steps to make L and R turns.  In gym educated pt on and performed Berg balance assessment with multiple sitting rest breaks to assess 02.  See below for details.  Performed stair negotiation up/down 3 short stairs with R rail with verbal cues for step to sequence with mod A with L genu recurvatum noted during stance.  Continued training with standing L lateral weight shifts and activation of L hip stabilization muscles during RLE foot taps to 4" step.  Returned to room and RN notified of abdominal pain; RN noted that NG tube was completely displaced.    Therapy Documentation Precautions:  Precautions Precautions: Fall Precaution Comments: LVAD, PICC line, NG tube, trach Restrictions Weight Bearing Restrictions: No Vital Signs: Therapy Vitals Pulse Rate: 110 Resp: 18 Oxygen Therapy SpO2: 98 % O2 Device: Trach collar O2 Flow Rate (L/min): 5 L/min FiO2 (%): 28  % Pulse Oximetry Type: Continuous Pain: Pain Assessment Pain Assessment: 0-10 Pain Score: 5  Pain Type: Acute pain Pain Location: Abdomen Pain Orientation: Left Pain Descriptors / Indicators: Discomfort Pain Onset: Unable to tell Pain Intervention(s): Ambulation/increased activity Locomotion : Ambulation Ambulation/Gait Assistance: 1: +2 Total assist  Balance: Standardized Balance Assessment Standardized Balance Assessment: Berg Balance Test Berg Balance Test Sit to Stand: Needs minimal aid to stand or to stabilize Standing Unsupported: Able to stand 2 minutes with supervision Sitting with Back Unsupported but Feet Supported on Floor or Stool: Able to sit safely and securely 2 minutes Stand to Sit: Sits independently, has uncontrolled descent Transfers: Needs one person to assist Standing Unsupported with Eyes Closed: Able to stand 10 seconds with supervision Standing Ubsupported with Feet Together: Needs help to attain position but able to stand for 30 seconds with feet together From Standing, Reach Forward with Outstretched Arm: Can reach forward >12 cm safely (5") From Standing Position, Pick up Object from Floor: Able to pick up shoe, needs supervision From Standing Position, Turn to Look Behind Over each Shoulder: Needs supervision when turning Turn 360 Degrees: Needs assistance while turning Standing Unsupported, Alternately Place Feet on Step/Stool: Able to complete >2 steps/needs minimal assist Standing Unsupported, One Foot in Front: Needs help to step but can hold 15 seconds Standing on One Leg: Tries to lift leg/unable to hold 3 seconds but remains standing independently Total Score: 24 Patient demonstrates increased fall risk as noted by score of 24/56 on Berg Balance Scale.  (<36=  high risk for falls, close to 100%; 37-45 significant >80%; 46-51 moderate >50%; 52-55 lower >25%).  Pt educated on falls risk and areas to focus on with therapy.    See FIM for current  functional status  Therapy/Group: Individual Therapy  Edman Circle [facility] 08/13/2013, 12:24 PM

## 2013-08-13 NOTE — IPOC Note (Addendum)
Overall Plan of Care [facility](IPOC) Patient Details Name: Leanne LovelyUndra Ziska MRN: [id number] DOB: 05/10/1961  Admitting Diagnosis: B BG CVA LVAD  Hospital Problems: Active Problems:   CVA (cerebral infarction)     Functional Problem List: Nursing Bladder;Bowel;Edema;Endurance;Medication Management;Motor;Nutrition;Safety  PT Balance;Motor  OT Balance;Edema;Endurance;Motor;Sensory  SLP Motor;Nutrition;Linguistic  TR  activity tolerance, balance, safety       Basic ADL's: OT Eating;Grooming;Bathing;Dressing;Toileting     Advanced  ADL's: OT Simple Meal Preparation     Transfers: PT Bed Mobility;Bed to Chair;Car  OT Toilet;Tub/Shower     Locomotion: PT Ambulation;Wheelchair Mobility;Stairs     Additional Impairments: OT Fuctional Use of Upper Extremity  SLP Swallowing;Communication expression    TR      Anticipated Outcomes Item Anticipated Outcome  Self Feeding supervision  Swallowing  Supervision    Basic self-care  min assist - supervision  Toileting  supervision   Bathroom Transfers min assist  Bowel/Bladder  Mod assist  Transfers  supervision basic; min assist car transfers  Locomotion  supervision ambulation with least restrictive device household; min assist with stairs  Communication  Min assist   Cognition     Pain  </=3  Safety/Judgment  No falls with injury   Therapy Plan: PT Intensity: Minimum of 1-2 x/day ,45 to 90 minutes PT Frequency: 5 out of 7 days PT Duration Estimated Length of Stay: 18 - 21 days OT Intensity: Minimum of 1-2 x/day, 45 to 90 minutes OT Frequency: 5 out of 7 days OT Duration/Estimated Length of Stay: 18-21 days SLP Intensity: Minumum of 1-2 x/day, 30 to 90 minutes SLP Frequency: 5 out of 7 days SLP Duration/Estimated Length of Stay: 18-21 days       Team Interventions: Nursing Interventions Patient/Family Education;Bladder Management;Bowel Management;Pain Management;Medication Management;Dysphagia/Aspiration Precaution  Training;Psychosocial Support  PT interventions Ambulation/gait training;Balance/vestibular training;Discharge planning;Functional mobility training;Neuromuscular re-education;Patient/family education;Stair training;Therapeutic Activities;Therapeutic Exercise;UE/LE Strength taining/ROM;Wheelchair propulsion/positioning  OT Interventions Financial controllerBalance/vestibular training;Discharge planning;DME/adaptive equipment instruction;Functional mobility training;Neuromuscular re-education;Pain management;Patient/family education;Psychosocial support;Self Care/advanced ADL retraining;Therapeutic Activities;Therapeutic Exercise;UE/LE Strength taining/ROM;UE/LE Coordination activities  SLP Interventions Cueing hierarchy;Dysphagia/aspiration precaution training;Functional tasks;Patient/family education;Speech/Language facilitation  TR Interventions  recreation/leisure participation, therapeutic activities, balance training, cognitive training/compensation, functional mobility, adaptive equipment use, UE/LE strengthening/coordiantion, community reintegration education, pt/family education, discharge planning, psychosocial support  SW/CM Interventions      Team Discharge Planning: Destination: PT-Home ,OT- Home , SLP-Home Projected Follow-up: PT-Home health PT, OT-  Home health OT;24 hour supervision/assistance, SLP-24 hour supervision/assistance;Home Health SLP Projected Equipment Needs: PT-To be determined, OT- Tub/shower bench;To be determined, SLP-To be determined Equipment Details: PT- , OT-  Patient/family involved in discharge planning: PT- Patient,  OT-Family member/caregiver, SLP-Patient;Family member/caregiver  MD ELOS: 21-25 day Medical Rehab Prognosis:  Fair Assessment: 53 y.o. 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 [facility], as [facility] to transplant. He was admitted for evaluation of new NSTEMI felt possibly to be related to pump thrombosis. Hosp course complicated by  episodes of CP and hypotension on 1/24 pm hours requiring pressors. He . 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 Aspiration PNA treated with zosyn. TEE 1/28 without evidence of clot but with RV failure  Extubated on 07/23/13 but has difficulty handling secretions and reintubated on 07/25/13. He developed oropharyngeal bleeding and ENT evaluations did not reveal source of acute bleeding. Trach placed by Dr. Brooks SailorsFeinstien on 07/28/13 and patient tolerating trach collar. He is NPO due to sever dysphagia but  deemed too high risk for PEG placement by IR and CCS. Is tolerating panda tube feeds without difficulty. Trach downsized to CFS # 6   Now requiring 24/7 Rehab RN,MD, as well as CIR level PT, OT and SLP.  Treatment team will focus on ADLs and mobility with goals set at [facility] a   See Team Conference Notes for weekly updates to the plan of care

## 2013-08-13 NOTE — Progress Notes (Signed)
Social Work Assessment and Plan Social Work Assessment and Plan  Patient Details  Name: [name] MRN: [id number] Date of Birth: 1961-02-27  Today's Date: 08/13/2013  Problem List:  Patient Active Problem List   Diagnosis Date Noted  . CVA (cerebral infarction) 07/19/2013  . Acute respiratory failure 07/19/2013  . Altered mental status 07/19/2013  . NSTEMI, initial episode of care 07/17/2013  . Supratherapeutic INR 08/18/2012  . Acute hemolysis 07/28/2012  . Hypertension 03/21/2012  . Cough 03/03/2012  . Nausea and vomiting 02/26/2012  . SVT (supraventricular tachycardia) 11/27/2011  . LVAD (left ventricular assist device) present 11/27/2011  . Ventricular tachycardia 11/22/2011  . Bipolar affective disorder 10/22/2011  . Migraine 10/22/2011  . Preventative health care 10/21/2011  . Gastritis, acute 09/04/2011  . Hypotension 08/10/2011  . Cardiogenic shock 07/10/2011  . Acute on chronic systolic heart failure 07/07/2011  . LV (left ventricular) mural thrombus 01/28/2011  . Thrombus 08/06/2010  . TOBACCO ABUSE 07/03/2010  . BACK PAIN 03/10/2009  . MITRAL STENOSIS/ INSUFFICIENCY, NON-RHEUMATIC 09/22/2008  . CONGESTIVE HEART FAILURE 09/22/2008  . SYSTOLIC HEART FAILURE, CHRONIC 09/22/2008  . Other malaise and fatigue 09/16/2008  . ABNORMAL HEART SOUNDS 09/16/2008  . DYSPNEA 09/16/2008  . Hepatomegaly 09/16/2008  . ABNORMAL ECHOCARDIOGRAM 09/16/2008  . ICD-[employer] 09/16/2008   Past Medical History:  Past Medical History  Diagnosis Date  . CHF (congestive heart failure)     EF- 10-15  . Medically noncompliant   . Mitral regurgitation   . Tobacco user   . HTN (hypertension)   . AICD (automatic cardioverter/defibrillator) present   . GERD (gastroesophageal reflux disease)   . Substance abuse   . Chronic renal insufficiency   . Syncope   . Thrombus 08/06/2010  . SYSTOLIC HEART FAILURE, CHRONIC 09/22/2008    Qualifier: Diagnosis of  By: Gala Romney, MD, Trixie Dredge   . LV (left ventricular) mural thrombus 01/28/2011  . ICD - IN SITU 09/16/2008    Qualifier: Diagnosis of  By: Wonda Amis    . MITRAL STENOSIS/ INSUFFICIENCY, NON-RHEUMATIC 09/22/2008    Qualifier: Diagnosis of  By: Gala Romney, MD, Trixie Dredge Hepatomegaly 09/16/2008    Qualifier: Diagnosis of  By: Wonda Amis    . High cholesterol 02/26/2012    "at one time"  . Sleep apnea   . Exertional dyspnea 02/26/2012  . History of blood transfusion 08/2011    "when I had heart pump"  . Migraines   . COMMON MIGRAINE 06/14/2009    Qualifier: Diagnosis of  By: Jonny Ruiz MD, Len Blalock   . History of gout 02/26/2012  . Depression 08/11/2013    Pt denies  . Bipolar affective disorder 10/22/2011    pt denies this hx 02/26/2012   Past Surgical History:  Past Surgical History  Procedure Laterality Date  . Cardiac defibrillator placement  ~ 2008  . Left ventricular assist device  08/2011  . Radiology with anesthesia N/A 07/19/2013    Procedure: RADIOLOGY WITH ANESTHESIA;  Surgeon: Oneal Grout, MD;  Location: [facility] OR;  Service: Radiology;  Laterality: N/A;  . Tracheostomy      feinstein   Social History:  reports that he quit smoking about 2 years ago. His smoking use included Cigarettes. He has a 8.25 pack-year smoking history. He quit smokeless tobacco use about 2 years ago. He reports that he does not drink alcohol or use illicit drugs.  Family / Support Systems Marital Status: Divorced Patient Roles: Partner;Parent  Spouse/Significant Other: Delaney Meigsamara Battle-girlfriend  [phone] Children: has son and two daughter's who visit Other Supports: Irish EldersLucille Michaels-Mom  [phone]  [phone]  [phone] Anticipated Caregiver: Delaney Meigsamara will make arrangements to provide care- may take time off of school-[employer] Ability/Limitations of Caregiver: Was taking classes at [employer] but will take a leave from Caregiver Availability: 24/7 Family Dynamics: Close knit family who are supportive and  involved with pt.  Delaney Meigsamara is his main person and caregiver.  They have been together on and off for ten years.  She is staying here with him to assist with his LVAD care.  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  Delaney Meigsamara is here also.     Abuse/Neglect Physical Abuse: Denies Verbal Abuse: Denies Sexual Abuse: Denies Exploitation of patient/patient's resources: Denies Self-Neglect: Denies  Emotional Status Pt's affect, behavior adn adjustment status: Pt is motivated to improve and regain his independence, his main short term goal at this point is to eat and be placed on a diet.  He hopes some of his tubes can be removed since htey limit his movement and therapy.  He is taking each day at a time, just trying to get through it. Delaney Meigsamara is a strong support of pt and very encouraging. Recent Psychosocial Issues: Other medical issues-LVAD and now trach, NG and B-CVA's Pyschiatric History: Pt denies chart diagnosis of Bipolar-Tamara states: " Not sure where they go that from."  She reports he has no history, may have had depression tendencies but valid with his multiple medical issues.  Will monitor and ask team to observe pt.  Will have Neuro-psych see when appropriate.  Will provide support while here. Substance Abuse History: No issues  Patient / Family Perceptions, Expectations & Goals Pt/Family understanding of illness & functional limitations: Pt appears to have a basic understanding of his condition.  It is difficult due to his language issues but he does yes/no accurately and gestures to communicate.  Delaney Meigsamara has a good understanding of his condition and speaks with  MD's daily.   Premorbid pt/family roles/activities: Father, Son, Boyfriend, Consulting civil engineertudent, [place]ephew, Friend, etc Anticipated changes in roles/activities/participation: Plans to resume at discharge Pt/family expectations/goals: Pt nods and gestures to communicate, he is learning to tolerate the passir muir valve.  Delaney Meigsamara states: " I hope he does well and can begin eating soon along with getting the trach out before he goes home."  [employer] Community Agencies: Other (Comment) ([facility] Premorbid Home Care/DME Agencies: Other (Comment) (Had in past) Transportation available at discharge: Family and [employer] Resource referrals recommended: Support group (specify) (CVA Support group)  Discharge Planning Living Arrangements: Spouse/significant other Support Systems: Spouse/significant other;Children;Parent;Other relatives;Friends/neighbors;Church/faith community Type of Residence: Private residence Insurance Resources: Medicare;Medicaid (specify county) ([place] Surveyor, quantityinancial Resources: SSD Financial Screen Referred: No Living Expenses: Psychologist, sport and exerciseent Money Management: Patient;Significant Other Does the patient have any problems obtaining your medications?: No Home Management: Delaney Meigsamara and Tour managerCS worker Patient/Family Preliminary Plans: Return home with Delaney Meigsamara providing care, other family members are supportive and involved, but she will be the one assisting with his care.  She is staying here to care of his LVAD and observing in therapies.  Will work on a safe discharge plan. Social Work Anticipated Follow Up Needs: HH/OP;Support Group  Clinical Impression Pleasant couple who are supportive  of one another.  Delaney Meigs is staying here and assisting with his care.  Pt is motivated to improve and regain as much of his independence as he can before he is discharged from here. Will work on discharge plans with both and provide support throughout his stay.   Lucy Chris 08/13/2013, 11:07 AM

## 2013-08-13 NOTE — Progress Notes (Signed)
SLP Cancellation Note  Patient Details Name: [name] MRN: [id number] DOB: 02-Mar-1961   Cancelled treatment:     Pt missed 45 minutes of skilled SLP treatment due to off unit for procedure.                                                     Feliberto Gottron, MA, CCC-SLP [phone]      Feliberto Gottron 08/13/2013, 3:23 PM

## 2013-08-14 ENCOUNTER — Inpatient Hospital Stay (HOSPITAL_COMMUNITY): Payer: Medicare Other | Admitting: Physical Therapy

## 2013-08-14 ENCOUNTER — Inpatient Hospital Stay (HOSPITAL_COMMUNITY): Payer: Medicare Other | Admitting: Speech Pathology

## 2013-08-14 DIAGNOSIS — I69920 Aphasia following unspecified cerebrovascular disease: Secondary | ICD-10-CM

## 2013-08-14 DIAGNOSIS — I509 Heart failure, unspecified: Secondary | ICD-10-CM

## 2013-08-14 DIAGNOSIS — I69959 Hemiplegia and hemiparesis following unspecified cerebrovascular disease affecting unspecified side: Secondary | ICD-10-CM

## 2013-08-14 DIAGNOSIS — R1312 Dysphagia, oropharyngeal phase: Secondary | ICD-10-CM

## 2013-08-14 DIAGNOSIS — I69991 Dysphagia following unspecified cerebrovascular disease: Secondary | ICD-10-CM

## 2013-08-14 DIAGNOSIS — D596 Hemoglobinuria due to hemolysis from other external causes: Secondary | ICD-10-CM

## 2013-08-14 DIAGNOSIS — R482 Apraxia: Secondary | ICD-10-CM

## 2013-08-14 DIAGNOSIS — I6340 Cerebral infarction due to embolism of unspecified cerebral artery: Secondary | ICD-10-CM

## 2013-08-14 LAB — CBC
HCT: 29.5 % — ABNORMAL LOW (ref 39.0–52.0)
Hemoglobin: 9.2 g/dL — ABNORMAL LOW (ref 13.0–17.0)
MCH: 28.3 pg (ref 26.0–34.0)
MCHC: 31.2 g/dL (ref 30.0–36.0)
MCV: 90.8 fL (ref 78.0–100.0)
PLATELETS: 377 10*3/uL (ref 150–400)
RBC: 3.25 MIL/uL — AB (ref 4.22–5.81)
RDW: 15.7 % — ABNORMAL HIGH (ref 11.5–15.5)
WBC: 4.9 10*3/uL (ref 4.0–10.5)

## 2013-08-14 LAB — PROTIME-INR
INR: 3 — AB (ref 0.00–1.49)
PROTHROMBIN TIME: 30.1 s — AB (ref 11.6–15.2)

## 2013-08-14 LAB — GLUCOSE, CAPILLARY
GLUCOSE-CAPILLARY: 127 mg/dL — AB (ref 70–99)
Glucose-Capillary: 108 mg/dL — ABNORMAL HIGH (ref 70–99)
Glucose-Capillary: 118 mg/dL — ABNORMAL HIGH (ref 70–99)

## 2013-08-14 LAB — LACTATE DEHYDROGENASE: LDH: 1110 U/L — ABNORMAL HIGH (ref 94–250)

## 2013-08-14 LAB — BASIC METABOLIC PANEL
BUN: 21 mg/dL (ref 6–23)
CALCIUM: 9 mg/dL (ref 8.4–10.5)
CO2: 29 mEq/L (ref 19–32)
Chloride: 98 mEq/L (ref 96–112)
Creatinine, Ser: 1.09 mg/dL (ref 0.50–1.35)
GFR calc non Af Amer: 77 mL/min — ABNORMAL LOW (ref >90)
GFR, EST AFRICAN AMERICAN: 90 mL/min — AB (ref >90)
Glucose, Bld: 139 mg/dL — ABNORMAL HIGH (ref 70–99)
Potassium: 4 mEq/L (ref 3.7–5.3)
SODIUM: 139 meq/L (ref 137–147)

## 2013-08-14 MED ORDER — WARFARIN SODIUM 3 MG PO TABS
3.0000 mg | ORAL_TABLET | Freq: Once | ORAL | Status: AC
  Administered 2013-08-14: 3 mg via ORAL
  Filled 2013-08-14: qty 1

## 2013-08-14 NOTE — Progress Notes (Signed)
Speech Language Pathology Daily Session Note  Patient Details  Name: [name] MRN: [id number] Date of Birth: [birth year]  Today's Date: 08/14/2013 Time: 1950-9326 Time Calculation (min): 43 min  Short Term Goals: Week 1: SLP Short Term Goal 1 (Week 1): Patient will tolerate PMSV for 1 hour with clinician supervision  SLP Short Term Goal 2 (Week 1): Patient will self-monitor and manage secreations with Mod clinician cues. SLP Short Term Goal 3 (Week 1): Patient will utilize speech intelligibility compensatory strategies with Mod clinician cues. SLP Short Term Goal 4 (Week 1): Patient will consume trials of ice chips with minimal overt s/s of aspiration with 50% of trials and Mod clinician cues.  Skilled Therapeutic Interventions: Skilled ST intervention provided with focus on dysphagia and communication goals. Slp repositioned by in bed to upright 90 degrees. Slp performed oral care and suctioning of trach prior to PMV trial. Pt tolerated PMV for 20 minutes with O2 levels ranging from 93-99. Slp provided verbal and visual cues to take occasional deep breaths when O2 stats dropped. Pt followed all directions appropriately. Pt attempted to verbalize at word level with PMV in place. Pt was able to count to 2 when cued to take deep breaths and used gestures to make needs known to caregivers. Slp trialed ice chips with PMV in place. Pt noted with no overt s/s of aspiration with ice chips, however O2 stats dropped from 98 to 94 x1, following 2 ice chips.    FIM:  Comprehension Comprehension Mode: Auditory Comprehension: 5-Understands basic 90% of the time/requires cueing < 10% of the time Expression Expression Mode: Nonverbal;Verbal Expression Assistive Devices: 6-Talk trach valve Expression: 1-Expresses basis less than 25% of the time/requires cueing greater than 75% of the time. Social Interaction Social Interaction: 5-Interacts appropriately 90% of the time - Needs monitoring or encouragement  for participation or interaction. Problem Solving Problem Solving: 3-Solves basic 50 - 74% of the time/requires cueing 25 - 49% of the time Memory Memory: 2-Recognizes or recalls 25 - 49% of the time/requires cueing 51 - 75% of the time  Pain Pain Assessment Pain Assessment: No/denies pain  Therapy/Group: Individual Therapy  Fortunata Betty, Kara Pacer 08/14/2013, 11:01 AM

## 2013-08-14 NOTE — Progress Notes (Signed)
Patient ID: [name], male   DOB: 1960-12-29, 53 y.o.   MRN: [id number] Advanced Heart Failure Rounding Note  Looks great. Watching cartoons. Smiling. Beginning to use PM valve and talking a little. Walking with rehab. Moving L arm slightly.   No more urinary retention. Weight down another 2 pounds.   LDH 1110  INR 3.0 Hg 9.2  VAD interrogated personally. Flow 5.1, Speed 9200 PI 5.7 Power 5.2  Alarms:  None  Occasional PI events. No power spikes   Filed Vitals:   08/13/13 2139 08/14/13 0057 08/14/13 0444 08/14/13 0619  Pulse: 98 97 99   Temp:      TempSrc:      Resp: 18 16 18    Weight:    85.1 kg (187 lb 9.8 oz)  SpO2: 100% 99% 100%     Intake/Output Summary (Last 24 hours) at 08/14/13 7915 Last data filed at 08/13/13 1250  Gross per 24 hour  Intake      0 ml  Output    350 ml  Net   -350 ml   Scheduled Meds: . allopurinol  100 mg Oral Daily  . antiseptic oral rinse  15 mL Mouth Rinse QID  . aspirin  81 mg Per NG tube Daily  . chlorhexidine  15 mL Mouth Rinse BID  . citalopram  10 mg Per Tube Daily  . clopidogrel  75 mg Per Tube Q breakfast  . feeding supplement (PRO-STAT SUGAR FREE 64)  30 mL Per Tube QID  . free water  200 mL Per Tube 4 times per day  . furosemide  40 mg Per Tube BID  . glycopyrrolate  1 mg Per Tube BID  . magnesium oxide  400 mg Per Tube Daily  . pantoprazole sodium  40 mg Per Tube Q1200  . sodium chloride  10-40 mL Intracatheter Q12H  . spironolactone  25 mg Per Tube Daily  . Warfarin - Pharmacist Dosing Inpatient   Does not apply q1800   Continuous Infusions: . feeding supplement (OSMOLITE 1.5 CAL) 1,000 mL (08/13/13 1600)   PRN Meds:.acetaminophen, bisacodyl, guaiFENesin-dextromethorphan, lidocaine, nitroGLYCERIN, ondansetron (ZOFRAN) IV, sodium chloride  LABS: Basic Metabolic Panel:  Recent Labs  05/69/79 0510 08/14/13 0505  NA 140 139  K 3.7 4.0  CL 100 98  CO2 29 29  GLUCOSE 148* 139*  BUN 19 21  CREATININE 0.95 1.09   CALCIUM 8.7 9.0   Liver Function Tests: No results found for this basename: AST, ALT, ALKPHOS, BILITOT, PROT, ALBUMIN,  in the last 72 hours No results found for this basename: LIPASE, AMYLASE,  in the last 72 hours CBC:  Recent Labs  08/12/13 0510 08/14/13 0505  WBC 5.1 4.9  HGB 8.4* 9.2*  HCT 27.2* 29.5*  MCV 91.3 90.8  PLT 359 377   Cardiac Enzymes: No results found for this basename: CKTOTAL, CKMB, CKMBINDEX, TROPONINI,  in the last 72 hours RADIOLOGY: Dg Chest Portable 1 View  07/17/2013   CLINICAL DATA:  Left-sided chest pain for 3 days, weakness and dizziness.  EXAM: PORTABLE CHEST - 1 VIEW  COMPARISON:  08/03/2012  FINDINGS: Evidence of median sternotomy noted with LVAD in place. Left-sided defibrillator noted. Mild prominence of the cardiac silhouette persists. Tricuspid valvuloplasty reidentified. Trace left pleural fluid or thickening persists. No new pulmonary opacity.  IMPRESSION: No new acute abnormality. Stable appearance of trace left pleural fluid or thickening.   Electronically Signed   By: Christiana Pellant M.D.   On: 07/17/2013 10:12  PHYSICAL EXAM-  General: NAD; on trach collar; Sitting in bed Neck: JVP 10; trach collar; clear secretions Lungs: coarse throughout CV: LVAD pump hum normal.   Abdomen: Nontender no hepatosplenomegaly, no distention. + BS Neurologic: awake interactive.  Moving left leg, slight movement left hand.   Extremities: No clubbing or cyanosis.  1+ edema RUE PICC    Assessment:   1. Acute embolic CVA 2. 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  3. 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 NICM EF 15%  5. Anemia 6. Respiratory failure s/p trach    Plan/Discussion:    Doing very well with rehab.  Volume status looks good. May need to decrease lasix to 40 daily soon.   Secretions remain a problem.  LDH continue to climb despite therapeutic INR (3.0), Plavix and asa 81. If going up again tomorrow will  restart heparin. Hgb stable. Will need pump exchange at some point. Consider echo Monday.  VAD interrogated personally.   Arvilla Meresaniel Tiffony Kite MD 9:25 AM

## 2013-08-14 NOTE — Progress Notes (Signed)
Physical Therapy Note  Patient Details  Name: [name] MRN: [id number] Date of Birth: [birth year] Today's Date: 08/14/2013  1110-1150 (40 minutes) individual Pain: no reported pain Other: Oxygen sats > 90% on 28% at trach collar Focus of treatment: bed mobility , transfer training, OOB tolerance Treatment: Pt completing nursing care, toileting in bed; pt rolls side to side with min assist; max assist for hygiene in supine; pt connected to batteries/ batteries placed in slings ; supine to side to sit min assist; stand turn transfer min assist bed to wc; pt up in wc with all needs within reach and family present.   Delvina Mizzell,JIM 08/14/2013, 11:49 AM

## 2013-08-14 NOTE — Progress Notes (Signed)
Subjective/Complaints: 53 y.o. 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 He is s/p LVAD placement at [facility] March 2013, as bridge to transplant. He was admitted for evaluation of new NSTEMI felt possibly to be related to pump thrombosis. Hosp course complicated by episodes of CP and hypotension on 1/24 pm hours requiring pressors. He . 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 Aspiration PNA treated with zosyn. TEE 1/28 without evidence of clot but with RV failure  Extubated on 07/23/13 but has difficulty handling secretions and reintubated on 07/25/13. He developed oropharyngeal bleeding and ENT evaluations did not reveal source of acute bleeding. Trach placed by Dr. Harland Dingwall on 07/28/13 and patient tolerating trach collar. He is NPO due to sever dysphagia but deemed too high risk for PEG placement by IR and CCS. Is tolerating panda tube feeds without difficulty. Trach downsized to CFS # 6 yesterday and PMSV trials attempted. Patient able to cough tracheally.  He developed hematuria with urinary retention and foley placed on 08/07/13. Has had complaints of bladder pain and foley d/c today. 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 Therapies ongoing and patient showing improvement in left hemiparesis. He continues with ideomotor/ oromotor apraxia, severe oropharyngeal dysphagia as well as aphasia   Objective: Rested well. No problems over night. In no distress  Vital Signs: Pulse 99, temperature 98.2 F (36.8 C), temperature source Oral, resp. rate 18, weight 85.1 kg (187 lb 9.8 oz), SpO2 100.00%. Dg Abd 1 View  08/13/2013   CLINICAL DATA:  Feeding tube placement  EXAM: ABDOMEN - 1 VIEW  COMPARISON:  DG NASO G TUBE PLC W/FL-NO RAD dated 08/12/2013  FINDINGS: A feeding tube has been advanced to the level of the distal duodenum  near the ligament of Treitz.  IMPRESSION: Feeding tube tip lies in the distal duodenum.   Electronically Signed   By: Aletta Edouard M.D.   On: 08/13/2013 16:03   Dg Addison Bailey G Tube Plc W/fl-no Rad  08/13/2013   CLINICAL DATA: severe dysphagia   NASO G TUBE PLACEMENT WITH FLUORO  Fluoroscopy was utilized by the requesting physician.  No radiographic  interpretation.    Dg Naso G Tube Plc W/fl-no Rad  08/12/2013   CLINICAL DATA: panda clogged   NASO G TUBE PLACEMENT WITH FLUORO  Fluoroscopy was utilized by the requesting physician.  No radiographic  interpretation.    Results for orders placed during the hospital encounter of 08/11/13 (from the past 72 hour(s))  GLUCOSE, CAPILLARY     Status: Abnormal   Collection Time    08/11/13  9:50 PM      Result Value Ref Range   Glucose-Capillary 110 (*) 70 - 99 mg/dL   Comment 1 Notify RN    BASIC METABOLIC PANEL     Status: Abnormal   Collection Time    08/12/13  5:10 AM      Result Value Ref Range   Sodium 140  137 - 147 mEq/L   Potassium 3.7  3.7 - 5.3 mEq/L   Chloride 100  96 - 112 mEq/L   CO2 29  19 - 32 mEq/L   Glucose, Bld 148 (*) 70 - 99 mg/dL   BUN 19  6 - 23 mg/dL   Creatinine, Ser 0.95  0.50 - 1.35 mg/dL   Calcium 8.7  8.4 - 10.5 mg/dL   GFR  calc non Af Amer >90  >90 mL/min   GFR calc Af Amer >90  >90 mL/min   Comment: (NOTE)     The eGFR has been calculated using the CKD EPI equation.     This calculation has not been validated in all clinical situations.     eGFR's persistently <90 mL/min signify possible Chronic Kidney     Disease.  CBC     Status: Abnormal   Collection Time    08/12/13  5:10 AM      Result Value Ref Range   WBC 5.1  4.0 - 10.5 K/uL   RBC 2.98 (*) 4.22 - 5.81 MIL/uL   Hemoglobin 8.4 (*) 13.0 - 17.0 g/dL   HCT 27.2 (*) 39.0 - 52.0 %   MCV 91.3  78.0 - 100.0 fL   MCH 28.2  26.0 - 34.0 pg   MCHC 30.9  30.0 - 36.0 g/dL   RDW 15.7 (*) 11.5 - 15.5 %   Platelets 359  150 - 400 K/uL  LACTATE DEHYDROGENASE      Status: Abnormal   Collection Time    08/12/13  5:10 AM      Result Value Ref Range   LDH 1048 (*) 94 - 250 U/L  PROTIME-INR     Status: Abnormal   Collection Time    08/12/13  5:10 AM      Result Value Ref Range   Prothrombin Time 32.7 (*) 11.6 - 15.2 seconds   INR 3.35 (*) 0.00 - 1.49  GLUCOSE, CAPILLARY     Status: Abnormal   Collection Time    08/12/13  6:19 AM      Result Value Ref Range   Glucose-Capillary 149 (*) 70 - 99 mg/dL  GLUCOSE, CAPILLARY     Status: None   Collection Time    08/12/13  2:14 PM      Result Value Ref Range   Glucose-Capillary 97  70 - 99 mg/dL   Comment 1 Notify RN    GLUCOSE, CAPILLARY     Status: None   Collection Time    08/12/13 10:01 PM      Result Value Ref Range   Glucose-Capillary 79  70 - 99 mg/dL   Comment 1 Notify RN    PROTIME-INR     Status: Abnormal   Collection Time    08/13/13  5:00 AM      Result Value Ref Range   Prothrombin Time 33.8 (*) 11.6 - 15.2 seconds   INR 3.50 (*) 0.00 - 1.49  GLUCOSE, CAPILLARY     Status: Abnormal   Collection Time    08/13/13  6:05 AM      Result Value Ref Range   Glucose-Capillary 103 (*) 70 - 99 mg/dL   Comment 1 Notify RN    GLUCOSE, CAPILLARY     Status: None   Collection Time    08/13/13  3:51 PM      Result Value Ref Range   Glucose-Capillary 83  70 - 99 mg/dL  GLUCOSE, CAPILLARY     Status: None   Collection Time    08/13/13  9:58 PM      Result Value Ref Range   Glucose-Capillary 79  70 - 99 mg/dL   Comment 1 Notify RN    BASIC METABOLIC PANEL     Status: Abnormal   Collection Time    08/14/13  5:05 AM      Result Value Ref Range   Sodium 139  137 - 147 mEq/L   Potassium 4.0  3.7 - 5.3 mEq/L   Chloride 98  96 - 112 mEq/L   CO2 29  19 - 32 mEq/L   Glucose, Bld 139 (*) 70 - 99 mg/dL   BUN 21  6 - 23 mg/dL   Creatinine, Ser 1.09  0.50 - 1.35 mg/dL   Calcium 9.0  8.4 - 10.5 mg/dL   GFR calc non Af Amer 77 (*) >90 mL/min   GFR calc Af Amer 90 (*) >90 mL/min   Comment:  (NOTE)     The eGFR has been calculated using the CKD EPI equation.     This calculation has not been validated in all clinical situations.     eGFR's persistently <90 mL/min signify possible Chronic Kidney     Disease.  CBC     Status: Abnormal   Collection Time    08/14/13  5:05 AM      Result Value Ref Range   WBC 4.9  4.0 - 10.5 K/uL   RBC 3.25 (*) 4.22 - 5.81 MIL/uL   Hemoglobin 9.2 (*) 13.0 - 17.0 g/dL   HCT 29.5 (*) 39.0 - 52.0 %   MCV 90.8  78.0 - 100.0 fL   MCH 28.3  26.0 - 34.0 pg   MCHC 31.2  30.0 - 36.0 g/dL   RDW 15.7 (*) 11.5 - 15.5 %   Platelets 377  150 - 400 K/uL  LACTATE DEHYDROGENASE     Status: Abnormal   Collection Time    08/14/13  5:05 AM      Result Value Ref Range   LDH 1110 (*) 94 - 250 U/L  PROTIME-INR     Status: Abnormal   Collection Time    08/14/13  5:05 AM      Result Value Ref Range   Prothrombin Time 30.1 (*) 11.6 - 15.2 seconds   INR 3.00 (*) 0.00 - 1.49  GLUCOSE, CAPILLARY     Status: Abnormal   Collection Time    08/14/13  6:14 AM      Result Value Ref Range   Glucose-Capillary 108 (*) 70 - 99 mg/dL   Comment 1 Notify RN       HEENT: thrush Cardio: LVAD hum, no S1 or S2 Resp: normal breath sounds GI: BS positive, Distention and NT Extremity:  No Edema Skin:   Intact and Other noirritation around panda or trach Neuro: Lethargic, Cranial Nerve II-XII normal, Abnormal Sensory difficult to assess secondary to aphasia and Aphasic, Motor 4/5 RUE and RLE, 2- Left UE and 3- LLE Musc/Skel:  Normal GEN NAD   Assessment/Plan: 1. 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 Physiatrist is providing close team supervision and 24 hour management of active medical problems listed below. Physiatrist and rehab team continue to assess barriers to discharge/monitor patient progress toward functional and  medical goals. FIM: FIM - Bathing Bathing Steps Patient Completed: Chest;Left Arm;Abdomen;Front perineal area Bathing: 2: Max-Patient completes 3-4 21f 10 parts or 25-49%  FIM - Upper Body Dressing/Undressing Upper body dressing/undressing steps patient completed: Thread/unthread right sleeve of pullover shirt/dresss;Put head through opening of pull over shirt/dress Upper body dressing/undressing: 2: Max-Patient completed 25-49% of tasks FIM - Lower Body Dressing/Undressing Lower body dressing/undressing: 1: Total-Patient completed less than 25% of tasks        FIM - Control and instrumentation engineer  Devices: Arm rests Bed/Chair Transfer: 3: Chair or W/C > Bed: Mod A (lift or lower assist);3: Bed > Chair or W/C: Mod A (lift or lower assist)  FIM - Locomotion: Wheelchair Locomotion: Wheelchair: 0: Activity did not occur FIM - Locomotion: Ambulation Locomotion: Ambulation Assistive Devices: Other (comment) (bilat HHA) Ambulation/Gait Assistance: 1: +2 Total assist Locomotion: Ambulation: 1: Two helpers  Comprehension Comprehension Mode: Auditory Comprehension: 3-Understands basic 50 - 74% of the time/requires cueing 25 - 50%  of the time  Expression Expression Mode: Nonverbal Expression Assistive Devices: 6-Talk trach valve Expression: 1-Expresses basis less than 25% of the time/requires cueing greater than 75% of the time.  Social Interaction Social Interaction: 4-Interacts appropriately 75 - 89% of the time - Needs redirection for appropriate language or to initiate interaction.  Problem Solving Problem Solving: 2-Solves basic 25 - 49% of the time - needs direction more than half the time to initiate, plan or complete simple activities  Memory Memory: 2-Recognizes or recalls 25 - 49% of the time/requires cueing 51 - 75% of the time  Medical Problem List and Plan:  Embolic bilateral BG CVA's, left thalamus CVA, CHF  1. 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 Pain Management: Will monitor for signs of distress. Tylenol prn.  3. Mood: No signs of distress but question awareness/insight into deficits. Continue celexa. Will have LCSW follow along for evaluation/support as indicated.  4. Neuropsych: This patient is capable of making decisions on her own behalf.  5. LVAD: Cardiology to follow for management/support.   -weights, I's and O's being followed closely 6. Urinary retention/Hematuria:  Started on flomax. PVR/cath prn.  7. Severe dysphagia/oral apraxia: Continue panda tube.   50 cc/hr with fluid bolus.  8. Trach dependent: Tolerating CFS #6. Continue to suction frequently, pulmonary toilet -trials of PMV with SLP, wean as pulmonary secretions/status allow, possible downsize to #4 next wk 9.  Anemia with hemolysis ? r/t pump, monitor  LOS (Days) 3 A FACE TO FACE EVALUATION WAS PERFORMED  Nicholai Willette T 08/14/2013, 8:45 AM

## 2013-08-14 NOTE — Progress Notes (Signed)
ANTICOAGULATION CONSULT NOTE - Follow Up Consult  Pharmacy Consult for Coumadin Indication: LVAD thrombosis  Allergies  Allergen Reactions  . Ace Inhibitors Cough  . Lexapro [Escitalopram Oxalate] Other (See Comments)    somnolence    Patient Measurements: Weight: 187 lb 9.8 oz (85.1 kg)  Vital Signs: Pulse Rate: 90 (02/21 0932)  Labs:  Recent Labs  08/12/13 0510 08/13/13 0500 08/14/13 0505  HGB 8.4*  --  9.2*  HCT 27.2*  --  29.5*  PLT 359  --  377  LABPROT 32.7* 33.8* 30.1*  INR 3.35* 3.50* 3.00*  CREATININE 0.95  --  1.09    The CrCl is unknown because both a height and weight (above a minimum accepted value) are required for this calculation.  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 INR now 3.0  Per MD, LDH cont to increase and may need change to heparin  Goal of Therapy:  INR 2-3 Monitor platelets by anticoagulation protocol: Yes   Plan:  1) Coumadin 3 mg tonight 2) Follow up INR in AM  Amariz Flamenco Poteet 08/14/2013,12:17 PM

## 2013-08-15 ENCOUNTER — Inpatient Hospital Stay (HOSPITAL_COMMUNITY): Payer: Medicare Other | Admitting: Physical Therapy

## 2013-08-15 ENCOUNTER — Inpatient Hospital Stay (HOSPITAL_COMMUNITY): Payer: Medicare Other | Admitting: Occupational Therapy

## 2013-08-15 LAB — LACTATE DEHYDROGENASE: LDH: 1174 U/L — ABNORMAL HIGH (ref 94–250)

## 2013-08-15 LAB — GLUCOSE, CAPILLARY
GLUCOSE-CAPILLARY: 112 mg/dL — AB (ref 70–99)
GLUCOSE-CAPILLARY: 112 mg/dL — AB (ref 70–99)
Glucose-Capillary: 126 mg/dL — ABNORMAL HIGH (ref 70–99)

## 2013-08-15 LAB — PROTIME-INR
INR: 2.32 — ABNORMAL HIGH (ref 0.00–1.49)
Prothrombin Time: 24.7 seconds — ABNORMAL HIGH (ref 11.6–15.2)

## 2013-08-15 MED ORDER — WARFARIN SODIUM 6 MG PO TABS
6.0000 mg | ORAL_TABLET | Freq: Once | ORAL | Status: AC
  Administered 2013-08-15: 6 mg via ORAL
  Filled 2013-08-15: qty 1

## 2013-08-15 MED ORDER — HEPARIN (PORCINE) IN NACL 100-0.45 UNIT/ML-% IJ SOLN
1200.0000 [IU]/h | INTRAMUSCULAR | Status: DC
  Administered 2013-08-15 – 2013-08-16 (×2): 1300 [IU]/h via INTRAVENOUS
  Administered 2013-08-17 – 2013-08-23 (×6): 1200 [IU]/h via INTRAVENOUS
  Filled 2013-08-15 (×12): qty 250

## 2013-08-15 NOTE — Progress Notes (Signed)
Called by lab to draw LDH@1715  that was ordered @09 :30 AM.

## 2013-08-15 NOTE — Progress Notes (Addendum)
Subjective/Complaints: 53 y.o. 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 He is s/p LVAD placement at [facility] March 2013, as bridge to transplant. He was admitted for evaluation of new NSTEMI felt possibly to be related to pump thrombosis. Hosp course complicated by episodes of CP and hypotension on 1/24 pm hours requiring pressors. He . 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 Aspiration PNA treated with zosyn. TEE 1/28 without evidence of clot but with RV failure  Extubated on 07/23/13 but has difficulty handling secretions and reintubated on 07/25/13. He developed oropharyngeal bleeding and ENT evaluations did not reveal source of acute bleeding. Trach placed by Dr. Harland Dingwall on 07/28/13 and patient tolerating trach collar. He is NPO due to sever dysphagia but deemed too high risk for PEG placement by IR and CCS. Is tolerating panda tube feeds without difficulty. Trach downsized to CFS # 6 yesterday and PMSV trials attempted. Patient able to cough tracheally.  He developed hematuria with urinary retention and foley placed on 08/07/13. Has had complaints of bladder pain and foley d/c today. 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 Therapies ongoing and patient showing improvement in left hemiparesis. He continues with ideomotor/ oromotor apraxia, severe oropharyngeal dysphagia as well as aphasia   Objective: Awake and alert. No complaints. Denies pain, depression. Tolerated activities well yesterday   Vital Signs: Pulse 90, temperature 98.5 F (36.9 C), temperature source Oral, resp. rate 17, weight 86.2 kg (190 lb 0.6 oz), SpO2 97.00%. Dg Abd 1 View  08/13/2013   CLINICAL DATA:  Feeding tube placement  EXAM: ABDOMEN - 1 VIEW  COMPARISON:  DG NASO G TUBE PLC W/FL-NO RAD dated 08/12/2013  FINDINGS: A feeding tube has been  advanced to the level of the distal duodenum near the ligament of Treitz.  IMPRESSION: Feeding tube tip lies in the distal duodenum.   Electronically Signed   By: Aletta Edouard M.D.   On: 08/13/2013 16:03   Dg Addison Bailey G Tube Plc W/fl-no Rad  08/13/2013   CLINICAL DATA: severe dysphagia   NASO G TUBE PLACEMENT WITH FLUORO  Fluoroscopy was utilized by the requesting physician.  No radiographic  interpretation.    Results for orders placed during the hospital encounter of 08/11/13 (from the past 72 hour(s))  GLUCOSE, CAPILLARY     Status: None   Collection Time    08/12/13  2:14 PM      Result Value Ref Range   Glucose-Capillary 97  70 - 99 mg/dL   Comment 1 Notify RN    GLUCOSE, CAPILLARY     Status: None   Collection Time    08/12/13 10:01 PM      Result Value Ref Range   Glucose-Capillary 79  70 - 99 mg/dL   Comment 1 Notify RN    PROTIME-INR     Status: Abnormal   Collection Time    08/13/13  5:00 AM      Result Value Ref Range   Prothrombin Time 33.8 (*) 11.6 - 15.2 seconds   INR 3.50 (*) 0.00 - 1.49  GLUCOSE, CAPILLARY     Status: Abnormal   Collection Time    08/13/13  6:05 AM      Result Value Ref Range   Glucose-Capillary 103 (*) 70 - 99 mg/dL   Comment 1 Notify RN    GLUCOSE, CAPILLARY  Status: None   Collection Time    08/13/13  3:51 PM      Result Value Ref Range   Glucose-Capillary 83  70 - 99 mg/dL  GLUCOSE, CAPILLARY     Status: None   Collection Time    08/13/13  9:58 PM      Result Value Ref Range   Glucose-Capillary 79  70 - 99 mg/dL   Comment 1 Notify RN    BASIC METABOLIC PANEL     Status: Abnormal   Collection Time    08/14/13  5:05 AM      Result Value Ref Range   Sodium 139  137 - 147 mEq/L   Potassium 4.0  3.7 - 5.3 mEq/L   Chloride 98  96 - 112 mEq/L   CO2 29  19 - 32 mEq/L   Glucose, Bld 139 (*) 70 - 99 mg/dL   BUN 21  6 - 23 mg/dL   Creatinine, Ser 1.09  0.50 - 1.35 mg/dL   Calcium 9.0  8.4 - 10.5 mg/dL   GFR calc non Af Amer 77 (*) >90  mL/min   GFR calc Af Amer 90 (*) >90 mL/min   Comment: (NOTE)     The eGFR has been calculated using the CKD EPI equation.     This calculation has not been validated in all clinical situations.     eGFR's persistently <90 mL/min signify possible Chronic Kidney     Disease.  CBC     Status: Abnormal   Collection Time    08/14/13  5:05 AM      Result Value Ref Range   WBC 4.9  4.0 - 10.5 K/uL   RBC 3.25 (*) 4.22 - 5.81 MIL/uL   Hemoglobin 9.2 (*) 13.0 - 17.0 g/dL   HCT 29.5 (*) 39.0 - 52.0 %   MCV 90.8  78.0 - 100.0 fL   MCH 28.3  26.0 - 34.0 pg   MCHC 31.2  30.0 - 36.0 g/dL   RDW 15.7 (*) 11.5 - 15.5 %   Platelets 377  150 - 400 K/uL  LACTATE DEHYDROGENASE     Status: Abnormal   Collection Time    08/14/13  5:05 AM      Result Value Ref Range   LDH 1110 (*) 94 - 250 U/L  PROTIME-INR     Status: Abnormal   Collection Time    08/14/13  5:05 AM      Result Value Ref Range   Prothrombin Time 30.1 (*) 11.6 - 15.2 seconds   INR 3.00 (*) 0.00 - 1.49  GLUCOSE, CAPILLARY     Status: Abnormal   Collection Time    08/14/13  6:14 AM      Result Value Ref Range   Glucose-Capillary 108 (*) 70 - 99 mg/dL   Comment 1 Notify RN    GLUCOSE, CAPILLARY     Status: Abnormal   Collection Time    08/14/13  7:37 AM      Result Value Ref Range   Glucose-Capillary 127 (*) 70 - 99 mg/dL  GLUCOSE, CAPILLARY     Status: Abnormal   Collection Time    08/14/13  4:15 PM      Result Value Ref Range   Glucose-Capillary 118 (*) 70 - 99 mg/dL  GLUCOSE, CAPILLARY     Status: Abnormal   Collection Time    08/15/13 12:03 AM      Result Value Ref Range   Glucose-Capillary 112 (*)  70 - 99 mg/dL  PROTIME-INR     Status: Abnormal   Collection Time    08/15/13  6:09 AM      Result Value Ref Range   Prothrombin Time 24.7 (*) 11.6 - 15.2 seconds   INR 2.32 (*) 0.00 - 1.49  GLUCOSE, CAPILLARY     Status: Abnormal   Collection Time    08/15/13  7:24 AM      Result Value Ref Range   Glucose-Capillary  126 (*) 70 - 99 mg/dL   Comment 1 Notify RN       HEENT: thrush Cardio: LVAD hum, no S1 or S2 Resp: normal breath sounds GI: BS positive, Distention and NT Extremity:  No Edema Skin:   Intact and Other noirritation around panda or trach Neuro: Lethargic, Cranial Nerve II-XII normal, Abnormal Sensory difficult to assess secondary to aphasia and Aphasic, Motor 4/5 RUE and RLE, 2- Left UE and 3- LLE Musc/Skel:  Normal GEN NAD   Assessment/Plan: 1. 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 Physiatrist is providing close team supervision and 24 hour management of active medical problems listed below. Physiatrist and rehab team continue to assess barriers to discharge/monitor patient progress toward functional and medical goals. FIM: FIM - Bathing Bathing Steps Patient Completed: Chest;Left Arm;Abdomen;Front perineal area Bathing: 2: Max-Patient completes 3-4 35f10 parts or 25-49%  FIM - Upper Body Dressing/Undressing Upper body dressing/undressing steps patient completed: Thread/unthread right sleeve of pullover shirt/dresss;Put head through opening of pull over shirt/dress Upper body dressing/undressing: 2: Max-Patient completed 25-49% of tasks FIM - Lower Body Dressing/Undressing Lower body dressing/undressing: 1: Total-Patient completed less than 25% of tasks  FIM - Toileting Toileting: 1: Two helpers  FIM - TRadio producerDevices: BRecruitment consultantTransfers: 3-To toilet/BSC: Mod A (lift or lower assist);3-From toilet/BSC: Mod A (lift or lower assist)  FIM - Bed/Chair Transfer Bed/Chair Transfer Assistive Devices: Arm rests Bed/Chair Transfer: 3: Sit > Supine: Mod A (lifting assist/Pt. 50-74%/lift 2 legs);3: Bed > Chair or W/C: Mod A (lift or lower assist);3: Chair or W/C > Bed: Mod A (lift or lower  assist);3: Supine > Sit: Mod A (lifting assist/Pt. 50-74%/lift 2 legs  FIM - Locomotion: Wheelchair Locomotion: Wheelchair: 0: Activity did not occur FIM - Locomotion: Ambulation Locomotion: Ambulation Assistive Devices: Other (comment) (bilat HHA) Ambulation/Gait Assistance: 1: +2 Total assist Locomotion: Ambulation: 1: Two helpers  Comprehension Comprehension Mode: Auditory Comprehension: 4-Understands basic 75 - 89% of the time/requires cueing 10 - 24% of the time  Expression Expression Mode: Nonverbal Expression Assistive Devices: 6-Talk trach valve Expression: 1-Expresses basis less than 25% of the time/requires cueing greater than 75% of the time.  Social Interaction Social Interaction: 5-Interacts appropriately 90% of the time - Needs monitoring or encouragement for participation or interaction.  Problem Solving Problem Solving: 3-Solves basic 50 - 74% of the time/requires cueing 25 - 49% of the time  Memory Memory: 2-Recognizes or recalls 25 - 49% of the time/requires cueing 51 - 75% of the time  Medical Problem List and Plan:  Embolic bilateral BG CVA's, left thalamus CVA, CHF  1. 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 Pain Management: Will monitor for signs of distress. Tylenol prn.  3. Mood: No signs of distress but question awareness/insight into deficits. Continue celexa. Will have LCSW follow along for evaluation/support as indicated.  4.  Neuropsych: This patient is capable of making decisions on her own behalf.  5. LVAD: Cardiology to follow for management/support.   -weights, I's and O's being followed closely 6. Urinary retention/Hematuria:  Started on flomax. PVR/cath prn.  7. Severe dysphagia/oral apraxia: Continue panda tube.   50 cc/hr with fluid bolus.  8. Trach dependent: Tolerating CFS #6. Continue to suction as needed pulmonary toilet- looks better today -trials of PMV with SLP, wean as  pulmonary secretions/status allow, possible downsize to #4 this week 9.  Anemia with hemolysis ? r/t pump, monitor  LOS (Days) 4 A FACE TO FACE EVALUATION WAS PERFORMED  Aldo Sondgeroth T 08/15/2013, 10:29 AM

## 2013-08-15 NOTE — Progress Notes (Signed)
ANTICOAGULATION CONSULT NOTE - Follow Up Consult  Pharmacy Consult for Coumadin/heparin Indication: LVAD thrombosis  Allergies  Allergen Reactions  . Ace Inhibitors Cough  . Lexapro [Escitalopram Oxalate] Other (See Comments)    somnolence    Patient Measurements: Weight: 190 lb 0.6 oz (86.2 kg)  Vital Signs: Pulse Rate: 92 (02/22 1534)  Labs:  Recent Labs  08/13/13 0500 08/14/13 0505 08/15/13 0609  HGB  --  9.2*  --   HCT  --  29.5*  --   PLT  --  377  --   LABPROT 33.8* 30.1* 24.7*  INR 3.50* 3.00* 2.32*  CREATININE  --  1.09  --     The CrCl is unknown because both a height and weight (above a minimum accepted value) are required for this calculation.  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 INR is increased and continues to be above goal despite holding last night's dose. Apparently his LDH cont to rise, Dr. Jearld Pies gave order to start heparin on the less aggressive side for clot.   Goal of Therapy:  INR 2-3 Monitor platelets by anticoagulation protocol: Yes Heparin level = 0.3-0.7  Plan:   Heparin at 1250 units/hr 6hr heparin level Daily level and CBC

## 2013-08-15 NOTE — Progress Notes (Signed)
Physical Therapy Note  Patient Details  Name: Arshaan Hostler MRN: [id number] Date of Birth: 12/01/1960 Today's Date: 08/15/2013  1000-1055 (55 minutes) individual Pain: no reported pain Other: Oxygen sats > 92% during therapy session / on 4.5 L (28%) trach collar ; no complaint of dizziness Focus of treatment: therapeutic exercise focused on activity tolerance / RT LE strengthening and control; gait training (monitoring oxygen sats) Treatment: Pt up in wc upon arrival; pt transported to gym; gait 60 feet X 3 RW + hand splint on right min/mod assist to assist to guide AD; Nustep level 2 X 10 minutes (see oxygen sats above); returned to room (pt continues on batteries); all needs within reach.    1330- 1410 (40 minutes) individual Pain: no reported pain Focus of treatment: gait training with /without AD; therapeutic exercise focused on strengthening/control LT LE / activity tolerance Treatment: Pt in gym finishing with OT ; gait 80 feet with RW + hand splint on left min /mod assist to guide AD; pt with increased forward trunk flexion in stance (decreased hip extension); gait 100 feet HHA on left min assist with therapists hand at left shoulder to facilitate trunk/hip extension in stance;up/down 4 inch step LT LE X 10 with RW support for knee/hip extension strengthening;  Nustep Level 3 (increased from level 2 in AM) X 10 minutes; Oxygen sats > 92% during PT Session on 4 L/min trach collar; returned to room ; reconnected to power source; all needs within reach.   Irais Mottram,JIM 08/15/2013, 10:55 AM

## 2013-08-15 NOTE — Progress Notes (Addendum)
Patient ID: [name], male   DOB: 06-Jul-1960, 53 y.o.   MRN: [id number] Advanced Heart Failure Rounding Note  Looks great. Walking with rehab. Moving L arm slightly.   No more urinary retention. Weight up a bit today.    INR 2.3, no LDH this morning.  VAD interrogated personally. Flow 4.6, Speed 9200 PI 6.6 Power 5.3  Alarms:  None.  2 PI events this am, 3 yesterday afternoon (consistent with past).    Filed Vitals:   08/15/13 0000 08/15/13 0455 08/15/13 0507 08/15/13 0746  Pulse: 96 90 94 90  Temp:   98.5 F (36.9 C)   TempSrc:   Oral   Resp: 18  18 17   Weight:   86.2 kg (190 lb 0.6 oz)   SpO2: 98% 99% 99% 97%    Intake/Output Summary (Last 24 hours) at 08/15/13 0856 Last data filed at 08/15/13 0700  Gross per 24 hour  Intake    400 ml  Output    500 ml  Net   -100 ml   Scheduled Meds: . allopurinol  100 mg Oral Daily  . antiseptic oral rinse  15 mL Mouth Rinse QID  . aspirin  81 mg Per NG tube Daily  . chlorhexidine  15 mL Mouth Rinse BID  . citalopram  10 mg Per Tube Daily  . clopidogrel  75 mg Per Tube Q breakfast  . feeding supplement (PRO-STAT SUGAR FREE 64)  30 mL Per Tube QID  . free water  200 mL Per Tube 4 times per day  . furosemide  40 mg Per Tube BID  . glycopyrrolate  1 mg Per Tube BID  . magnesium oxide  400 mg Per Tube Daily  . pantoprazole sodium  40 mg Per Tube Q1200  . sodium chloride  10-40 mL Intracatheter Q12H  . spironolactone  25 mg Per Tube Daily  . Warfarin - Pharmacist Dosing Inpatient   Does not apply q1800   Continuous Infusions: . feeding supplement (OSMOLITE 1.5 CAL) 1,000 mL (08/15/13 0406)   PRN Meds:.acetaminophen, bisacodyl, guaiFENesin-dextromethorphan, lidocaine, nitroGLYCERIN, ondansetron (ZOFRAN) IV, sodium chloride  LABS: Basic Metabolic Panel:  Recent Labs  67/67/20 0505  NA 139  K 4.0  CL 98  CO2 29  GLUCOSE 139*  BUN 21  CREATININE 1.09  CALCIUM 9.0   Liver Function Tests: No results found for this  basename: AST, ALT, ALKPHOS, BILITOT, PROT, ALBUMIN,  in the last 72 hours No results found for this basename: LIPASE, AMYLASE,  in the last 72 hours CBC:  Recent Labs  08/14/13 0505  WBC 4.9  HGB 9.2*  HCT 29.5*  MCV 90.8  PLT 377   Cardiac Enzymes: No results found for this basename: CKTOTAL, CKMB, CKMBINDEX, TROPONINI,  in the last 72 hours RADIOLOGY: Dg Chest Portable 1 View  07/17/2013   CLINICAL DATA:  Left-sided chest pain for 3 days, weakness and dizziness.  EXAM: PORTABLE CHEST - 1 VIEW  COMPARISON:  08/03/2012  FINDINGS: Evidence of median sternotomy noted with LVAD in place. Left-sided defibrillator noted. Mild prominence of the cardiac silhouette persists. Tricuspid valvuloplasty reidentified. Trace left pleural fluid or thickening persists. No new pulmonary opacity.  IMPRESSION: No new acute abnormality. Stable appearance of trace left pleural fluid or thickening.   Electronically Signed   By: Christiana Pellant M.D.   On: 07/17/2013 10:12    PHYSICAL EXAM-  General: NAD; on trach collar; Sitting in bed Neck: JVP 10; trach collar; clear secretions Lungs: coarse throughout  CV: LVAD pump hum normal.   Abdomen: Nontender no hepatosplenomegaly, no distention. + BS Neurologic: awake interactive.  Moving left leg, slight movement left hand.   Extremities: No clubbing or cyanosis.  1+ edema RUE PICC    Assessment:   1. Acute embolic CVA 2. 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  3. 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 NICM EF 15%  5. Anemia 6. Respiratory failure s/p trach    Plan/Discussion:    Doing very well with rehab.  Volume status looks good. Weight up a bit today, continue current Lasix.   Secretions remain a problem.  LDH has been climbing despite therapeutic INR, Plavix and asa 81. Need to get LDH this morning.  If rising, will restart heparin. Will need pump exchange at some point. Consider echo Monday.  VAD interrogated  personally.   Marca Anconaalton Cambridge Deleo MD 8:56 AM   LDH today is higher than yesterday.  Will initiate heparin without bolus, running it at the low end of therapeutic as INR is 2.3.  Marca AnconaDalton Neeta Storey 08/15/2013

## 2013-08-15 NOTE — Progress Notes (Signed)
Dr. Shirlee Latch notified of patient's LDH level at 1174 now and previous level on 08/14/13 was 1110.  Orders received:  Heparin protocol, no bolus; low end of therapeutic Heparin because patient is on Coumadin.

## 2013-08-15 NOTE — Progress Notes (Signed)
ANTICOAGULATION CONSULT NOTE - Follow Up Consult  Pharmacy Consult for Coumadin Indication: LVAD thrombosis  Allergies  Allergen Reactions  . Ace Inhibitors Cough  . Lexapro [Escitalopram Oxalate] Other (See Comments)    somnolence    Patient Measurements: Weight: 190 lb 0.6 oz (86.2 kg)  Vital Signs: Temp: 98.5 F (36.9 C) (02/22 0507) Temp src: Oral (02/22 0507) Pulse Rate: 88 (02/22 1230)  Labs:  Recent Labs  08/13/13 0500 08/14/13 0505 08/15/13 0609  HGB  --  9.2*  --   HCT  --  29.5*  --   PLT  --  377  --   LABPROT 33.8* 30.1* 24.7*  INR 3.50* 3.00* 2.32*  CREATININE  --  1.09  --     The CrCl is unknown because both a height and weight (above a minimum accepted value) are required for this calculation.  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 INR now 2.32  Per MD, LDH cont to increase and may need change to heparin  Goal of Therapy:  INR 2-3 Monitor platelets by anticoagulation protocol: Yes   Plan:  1) Coumadin 6 mg tonight 2) Follow up INR in AM  [name] 08/15/2013,1:24 PM

## 2013-08-15 NOTE — Progress Notes (Signed)
Occupational Therapy Session Note  Patient Details  Name: [name] MRN: [id number] Date of Birth: 1960/08/05  Today's Date: 08/15/2013 Time: 0930-1000 and 1300-1330 Time Calculation (min): 30 min and 30 min  Short Term Goals: Week 1:  OT Short Term Goal 1 (Week 1): Pt will complete bathing with mod assist  OT Short Term Goal 2 (Week 1): Pt will complete UB dressing with mod assist OT Short Term Goal 3 (Week 1): Pt will complete LB dressing with max assist OT Short Term Goal 4 (Week 1): Pt will utilize LUE as stabilizer to assist with changing LVAD hookup from wall power to battery OT Short Term Goal 5 (Week 1): Pt will complete toilet transfer with mod assist  Skilled Therapeutic Interventions/Progress Updates:    1)Pt missed 30 mins skilled OT treatment session secondary to respiratory attending to pt and then pt refusing treatment session secondary to reports of poor nights sleep due to coughing.  Encouraged pt to participate to which he again shook his head no.  Returned at later time to attempt.  Pt willing to get OOB and ready for PT session.  While completing perineal hygiene pt reports need to have BM, stand pivot transfer to [facility] with mod assist.  Min assist sit > stand and steady assist in standing while therapist completed hygiene and donned brief.  Pt stand pivot to w/c with mod assist. Pt passed off to PT.  2) Engaged in NM re-ed of LUE in sitting and standing with WB through LUE followed by towel glides.  Pt performed stand pivot transfer to therapy mat with min-mod assist.  Engaged in WB through LUE in standing while reaching across midline to place clothespins on rack.  Following WB engaged in towel glides on board in lap with focus on shoulder flexion and elbow extension.  Pt with increased elbow flexion and extension today however continues to require assist to elicit shoulder movement.  Therapy Documentation Precautions:  Precautions Precautions: Fall Precaution Comments:  LVAD, PICC line, NG tube, trach Restrictions Weight Bearing Restrictions: No General: General Amount of Missed OT Time (min): 30 Minutes Pain:  Pt with no c/o pain  See FIM for current functional status  Therapy/Group: Individual Therapy  Rosalio Loud 08/15/2013, 12:00 PM

## 2013-08-16 ENCOUNTER — Encounter (HOSPITAL_COMMUNITY): Payer: Medicare Other

## 2013-08-16 ENCOUNTER — Inpatient Hospital Stay (HOSPITAL_COMMUNITY): Payer: Medicare Other

## 2013-08-16 ENCOUNTER — Inpatient Hospital Stay (HOSPITAL_COMMUNITY): Payer: Medicare Other | Admitting: Physical Therapy

## 2013-08-16 ENCOUNTER — Inpatient Hospital Stay (HOSPITAL_COMMUNITY): Payer: Medicare Other | Admitting: Occupational Therapy

## 2013-08-16 DIAGNOSIS — I6340 Cerebral infarction due to embolism of unspecified cerebral artery: Secondary | ICD-10-CM

## 2013-08-16 DIAGNOSIS — I509 Heart failure, unspecified: Secondary | ICD-10-CM

## 2013-08-16 DIAGNOSIS — I69959 Hemiplegia and hemiparesis following unspecified cerebrovascular disease affecting unspecified side: Secondary | ICD-10-CM

## 2013-08-16 DIAGNOSIS — R482 Apraxia: Secondary | ICD-10-CM

## 2013-08-16 DIAGNOSIS — Z930 Tracheostomy status: Secondary | ICD-10-CM

## 2013-08-16 DIAGNOSIS — R1312 Dysphagia, oropharyngeal phase: Secondary | ICD-10-CM

## 2013-08-16 DIAGNOSIS — J9600 Acute respiratory failure, unspecified whether with hypoxia or hypercapnia: Secondary | ICD-10-CM

## 2013-08-16 DIAGNOSIS — I69920 Aphasia following unspecified cerebrovascular disease: Secondary | ICD-10-CM

## 2013-08-16 DIAGNOSIS — I5022 Chronic systolic (congestive) heart failure: Secondary | ICD-10-CM

## 2013-08-16 DIAGNOSIS — I69991 Dysphagia following unspecified cerebrovascular disease: Secondary | ICD-10-CM

## 2013-08-16 LAB — GLUCOSE, CAPILLARY
GLUCOSE-CAPILLARY: 130 mg/dL — AB (ref 70–99)
GLUCOSE-CAPILLARY: 98 mg/dL (ref 70–99)
Glucose-Capillary: 111 mg/dL — ABNORMAL HIGH (ref 70–99)
Glucose-Capillary: 98 mg/dL (ref 70–99)

## 2013-08-16 LAB — CBC
HEMATOCRIT: 29.2 % — AB (ref 39.0–52.0)
HEMOGLOBIN: 9.3 g/dL — AB (ref 13.0–17.0)
MCH: 28.1 pg (ref 26.0–34.0)
MCHC: 31.8 g/dL (ref 30.0–36.0)
MCV: 88.2 fL (ref 78.0–100.0)
Platelets: 357 10*3/uL (ref 150–400)
RBC: 3.31 MIL/uL — ABNORMAL LOW (ref 4.22–5.81)
RDW: 15.4 % (ref 11.5–15.5)
WBC: 5.3 10*3/uL (ref 4.0–10.5)

## 2013-08-16 LAB — HEPARIN LEVEL (UNFRACTIONATED)
HEPARIN UNFRACTIONATED: 0.43 [IU]/mL (ref 0.30–0.70)
Heparin Unfractionated: 0.61 IU/mL (ref 0.30–0.70)

## 2013-08-16 LAB — BASIC METABOLIC PANEL
BUN: 20 mg/dL (ref 6–23)
CALCIUM: 8.7 mg/dL (ref 8.4–10.5)
CHLORIDE: 98 meq/L (ref 96–112)
CO2: 27 meq/L (ref 19–32)
Creatinine, Ser: 0.91 mg/dL (ref 0.50–1.35)
GFR calc Af Amer: >90 mL/min (ref >90)
GFR calc non Af Amer: >90 mL/min (ref >90)
Glucose, Bld: 101 mg/dL — ABNORMAL HIGH (ref 70–99)
Potassium: 4.1 mEq/L (ref 3.7–5.3)
Sodium: 137 mEq/L (ref 137–147)

## 2013-08-16 LAB — PROTIME-INR
INR: 2.23 — ABNORMAL HIGH (ref 0.00–1.49)
PROTHROMBIN TIME: 24 s — AB (ref 11.6–15.2)

## 2013-08-16 LAB — LACTATE DEHYDROGENASE: LDH: 1084 U/L — AB (ref 94–250)

## 2013-08-16 MED ORDER — WARFARIN SODIUM 6 MG PO TABS
6.0000 mg | ORAL_TABLET | Freq: Once | ORAL | Status: AC
  Administered 2013-08-16: 6 mg via ORAL
  Filled 2013-08-16: qty 1

## 2013-08-16 MED ORDER — GLYCOPYRROLATE 1 MG PO TABS
1.0000 mg | ORAL_TABLET | Freq: Every day | ORAL | Status: DC
  Administered 2013-08-17 – 2013-08-23 (×7): 1 mg
  Filled 2013-08-16 (×9): qty 1

## 2013-08-16 NOTE — Progress Notes (Signed)
ANTICOAGULATION CONSULT NOTE - Follow Up Consult  Pharmacy Consult for Heparin Indication: LVAD thrombosis  Allergies  Allergen Reactions  . Ace Inhibitors Cough  . Lexapro [Escitalopram Oxalate] Other (See Comments)    somnolence    Patient Measurements: Weight: 190 lb 11.2 oz (86.5 kg)  Vital Signs: Pulse Rate: 89 (02/23 1701)  Labs:  Recent Labs  08/14/13 0505 08/15/13 0609 08/16/13 0300 08/16/13 0605 08/16/13 1630  HGB 9.2*  --  9.3*  --   --   HCT 29.5*  --  29.2*  --   --   PLT 377  --  357  --   --   LABPROT 30.1* 24.7*  --  24.0*  --   INR 3.00* 2.32*  --  2.23*  --   HEPARINUNFRC  --   --   --  0.43 0.61  CREATININE 1.09  --   --  0.91  --     The CrCl is unknown because both a height and weight (above a minimum accepted value) are required for this calculation.   Medications:  Heparin 1300 units/hr  Assessment: 52yom with LVAD and recent CVA continues on heparin for possible pump thrombosis. Heparin level (0.61) trended up above reduced goal range - will decrease rate and check follow-up level.  - H/H and Plts stable - Minimal blood tinged secretions reported - RN has notified PA  Goal of Therapy:  Heparin level 0.3-0.5 units/ml Monitor platelets by anticoagulation protocol: Yes   Plan:  1. Decrease heparin drip to 1200 units/hr (12 ml/hr) 2. Check heparin level 6 hours after rate decrease 3. Monitor for s/sx of bleeding  Cleon Dew [phone] 08/16/2013,5:37 PM

## 2013-08-16 NOTE — Progress Notes (Signed)
PULMONARY / CRITICAL CARE MEDICINE  Name: [name] MRN: [id number] DOB: 1960/08/13    ADMISSION DATE:  08/11/2013 CONSULTATION DATE:  07/19/2013  REFERRING MD :  Heart failure PRIMARY SERVICE:  Rehab   CHIEF COMPLAINT:  Tracheostomy care  BRIEF PATIENT DESCRIPTION: 53 yo with non-ischemic cardiomyopathy / LVAD admitted to [facility] 1/24 with NSTEMI / pump thrombosis.  Course was complicated by CVA and respiratory failure requiring tracheostomy.  Tube was changed to cuffless # 6 on 2/13. Transferred to rehab on 2/18.  SIGNIFICANT EVENTS / STUDIES:   LINE / TUBES:  CULTURES:  ANTIBIOTICS:  SUBJECTIVE: Secretions improved.  SLP evaluation on progress.  Strong cough.  VITAL SIGNS: Temp:  [98.4 F (36.9 C)] 98.4 F (36.9 C) (02/23 0334) Pulse Rate:  [91-98] 92 (02/23 1224) Resp:  [16-18] 16 (02/23 1224) SpO2:  [99 %-100 %] 100 % (02/23 1224) FiO2 (%):  [28 %] 28 % (02/23 1224) Weight:  [86.5 kg (190 lb 11.2 oz)] 86.5 kg (190 lb 11.2 oz) (02/23 4098)  PHYSICAL EXAMINATION: General:  Resting comfortably, no distress Neuro:  Awake, alert, cooperative with examination HEENT:  NCAT Neck:  Tracheostomy site intact Cardiovascular:  LVAD noise Lungs:  Scattered rhonchi Abdomen:  Soft, nontender Musculoskeletal:  Intact Skin:  No rash  Recent Labs Lab 08/12/13 0510 08/14/13 0505 08/16/13 0605  NA 140 139 137  K 3.7 4.0 4.1  CL 100 98 98  CO2 29 29 27   BUN 19 21 20   CREATININE 0.95 1.09 0.91  GLUCOSE 148* 139* 101*    Recent Labs Lab 08/12/13 0510 08/14/13 0505 08/16/13 0300  HGB 8.4* 9.2* 9.3*  HCT 27.2* 29.5* 29.2*  WBC 5.1 4.9 5.3  PLT 359 377 357   Imaging: No results found.  ASSESSMENT / PLAN:  Resolving respiratory failure in setting of CVA Tracheostomy status  -->  Continue present trach size, would not decannulate / downsize until secretions improve -->  Continue pulmonary hygiene -->  PMV -->  Continue SLP evaluation -->  Will follow   I have  personally obtained history, examined patient, evaluated and interpreted laboratory and imaging results, reviewed medical records, formulated assessment / plan and placed orders.  Lonia Farber, MD Pulmonary and Critical Care Medicine [facility] Pager: [phone]  08/16/2013, 3:37 PM

## 2013-08-16 NOTE — Progress Notes (Signed)
Subjective/Complaints: 53 y.o. 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 He is s/p LVAD placement at [facility] March 2013, as bridge to transplant. He was admitted for evaluation of new NSTEMI felt possibly to be related to pump thrombosis. Hosp course complicated by episodes of CP and hypotension on 1/24 pm hours requiring pressors. He . 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 Aspiration PNA treated with zosyn. TEE 1/28 without evidence of clot but with RV failure  Extubated on 07/23/13 but has difficulty handling secretions and reintubated on 07/25/13. He developed oropharyngeal bleeding and ENT evaluations did not reveal source of acute bleeding. Trach placed by Dr. Harland Dingwall on 07/28/13 and patient tolerating trach collar. He is NPO due to sever dysphagia but deemed too high risk for PEG placement by IR and CCS. Is tolerating panda tube feeds without difficulty. Trach downsized to CFS # 6 yesterday and PMSV trials attempted. Patient able to cough tracheally.  He developed hematuria with urinary retention and foley placed on 08/07/13. Has had complaints of bladder pain and foley d/c today. 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 Therapies ongoing and patient showing improvement in left hemiparesis. He continues with ideomotor/ oromotor apraxia, severe oropharyngeal dysphagia as well as aphasia   Objective: Awake Denies SOB Appreciate cardiology notes, pt on Heparin Review of Systems - cannot obtain secondary to reduced speech   Vital Signs: Pulse 91, temperature 98.4 F (36.9 C), temperature source Oral, resp. rate 16, weight 86.5 kg (190 lb 11.2 oz), SpO2 100.00%. No results found. Results for orders placed during the hospital encounter of 08/11/13 (from the past 72 hour(s))  GLUCOSE, CAPILLARY     Status: None   Collection Time    08/13/13  3:51 PM      Result Value Ref Range   Glucose-Capillary 83  70 - 99 mg/dL  GLUCOSE, CAPILLARY     Status: None   Collection Time    08/13/13  9:58 PM      Result Value Ref Range   Glucose-Capillary 79  70 - 99 mg/dL   Comment 1 Notify RN    BASIC METABOLIC PANEL     Status: Abnormal   Collection Time    08/14/13  5:05 AM      Result Value Ref Range   Sodium 139  137 - 147 mEq/L   Potassium 4.0  3.7 - 5.3 mEq/L   Chloride 98  96 - 112 mEq/L   CO2 29  19 - 32 mEq/L   Glucose, Bld 139 (*) 70 - 99 mg/dL   BUN 21  6 - 23 mg/dL   Creatinine, Ser 1.09  0.50 - 1.35 mg/dL   Calcium 9.0  8.4 - 10.5 mg/dL   GFR calc non Af Amer 77 (*) >90 mL/min   GFR calc Af Amer 90 (*) >90 mL/min   Comment: (NOTE)     The eGFR has been calculated using the CKD EPI equation.     This calculation has not been validated in all clinical situations.     eGFR's persistently <90 mL/min signify possible Chronic Kidney     Disease.  CBC     Status: Abnormal   Collection Time    08/14/13  5:05 AM      Result Value Ref Range   WBC 4.9  4.0 - 10.5 K/uL   RBC  3.25 (*) 4.22 - 5.81 MIL/uL   Hemoglobin 9.2 (*) 13.0 - 17.0 g/dL   HCT 21.3 (*) 08.6 - 57.8 %   MCV 90.8  78.0 - 100.0 fL   MCH 28.3  26.0 - 34.0 pg   MCHC 31.2  30.0 - 36.0 g/dL   RDW 46.9 (*) 62.9 - 52.8 %   Platelets 377  150 - 400 K/uL  LACTATE DEHYDROGENASE     Status: Abnormal   Collection Time    08/14/13  5:05 AM      Result Value Ref Range   LDH 1110 (*) 94 - 250 U/L  PROTIME-INR     Status: Abnormal   Collection Time    08/14/13  5:05 AM      Result Value Ref Range   Prothrombin Time 30.1 (*) 11.6 - 15.2 seconds   INR 3.00 (*) 0.00 - 1.49  GLUCOSE, CAPILLARY     Status: Abnormal   Collection Time    08/14/13  6:14 AM      Result Value Ref Range   Glucose-Capillary 108 (*) 70 - 99 mg/dL   Comment 1 Notify RN    GLUCOSE, CAPILLARY     Status: Abnormal   Collection Time    08/14/13  7:37 AM      Result  Value Ref Range   Glucose-Capillary 127 (*) 70 - 99 mg/dL  GLUCOSE, CAPILLARY     Status: Abnormal   Collection Time    08/14/13  4:15 PM      Result Value Ref Range   Glucose-Capillary 118 (*) 70 - 99 mg/dL  GLUCOSE, CAPILLARY     Status: Abnormal   Collection Time    08/15/13 12:03 AM      Result Value Ref Range   Glucose-Capillary 112 (*) 70 - 99 mg/dL  PROTIME-INR     Status: Abnormal   Collection Time    08/15/13  6:09 AM      Result Value Ref Range   Prothrombin Time 24.7 (*) 11.6 - 15.2 seconds   INR 2.32 (*) 0.00 - 1.49  GLUCOSE, CAPILLARY     Status: Abnormal   Collection Time    08/15/13  7:24 AM      Result Value Ref Range   Glucose-Capillary 126 (*) 70 - 99 mg/dL   Comment 1 Notify RN    GLUCOSE, CAPILLARY     Status: Abnormal   Collection Time    08/15/13  4:40 PM      Result Value Ref Range   Glucose-Capillary 112 (*) 70 - 99 mg/dL  LACTATE DEHYDROGENASE     Status: Abnormal   Collection Time    08/15/13  6:20 PM      Result Value Ref Range   LDH 1174 (*) 94 - 250 U/L  GLUCOSE, CAPILLARY     Status: Abnormal   Collection Time    08/16/13 12:07 AM      Result Value Ref Range   Glucose-Capillary 111 (*) 70 - 99 mg/dL     HEENT: thrush Cardio: LVAD hum, no S1 or S2 Resp: normal breath sounds GI: BS positive, Distention and NT Extremity:  No Edema Skin:   Intact and Other noirritation around panda or trach Neuro: Lethargic, Cranial Nerve II-XII normal, Abnormal Sensory difficult to assess secondary to aphasia and Aphasic, Motor 4/5 RUE and RLE, 2- Left UE and 3- LLE Musc/Skel:  Normal GEN NAD   Assessment/Plan: 1. Functional deficits secondary to Bilateral BG embolic infarct  with Left HP, Aphasia which require 3+ hours per day of interdisciplinary therapy in a comprehensive inpatient rehab setting. Physiatrist is providing close team supervision and 24 hour management of active medical problems listed below. Physiatrist and rehab team continue to  assess barriers to discharge/monitor patient progress toward functional and medical goals. FIM: FIM - Bathing Bathing Steps Patient Completed: Chest;Left Arm;Abdomen;Front perineal area Bathing: 2: Max-Patient completes 3-4 50f 10 parts or 25-49%  FIM - Upper Body Dressing/Undressing Upper body dressing/undressing steps patient completed: Thread/unthread right sleeve of pullover shirt/dresss;Put head through opening of pull over shirt/dress Upper body dressing/undressing: 2: Max-Patient completed 25-49% of tasks FIM - Lower Body Dressing/Undressing Lower body dressing/undressing: 1: Total-Patient completed less than 25% of tasks  FIM - Toileting Toileting: 1: Total-Patient completed zero steps, helper did all 3  FIM - Radio producer Devices: Bedside commode Toilet Transfers: 3-To toilet/BSC: Mod A (lift or lower assist);3-From toilet/BSC: Mod A (lift or lower assist)  FIM - Bed/Chair Transfer Bed/Chair Transfer Assistive Devices: Arm rests Bed/Chair Transfer: 3: Supine > Sit: Mod A (lifting assist/Pt. 50-74%/lift 2 legs;3: Bed > Chair or W/C: Mod A (lift or lower assist)  FIM - Locomotion: Wheelchair Locomotion: Wheelchair: 0: Activity did not occur FIM - Locomotion: Ambulation Locomotion: Ambulation Assistive Devices: Other (comment) (bilat HHA) Ambulation/Gait Assistance: 1: +2 Total assist Locomotion: Ambulation: 1: Two helpers  Comprehension Comprehension Mode: Auditory Comprehension: 4-Understands basic 75 - 89% of the time/requires cueing 10 - 24% of the time  Expression Expression Mode: Nonverbal Expression Assistive Devices: 6-Other (Comment) (offer of two answers/and pt chooses) Expression: 1-Expresses basis less than 25% of the time/requires cueing greater than 75% of the time.  Social Interaction Social Interaction: 5-Interacts appropriately 90% of the time - Needs monitoring or encouragement for participation or interaction.  Problem  Solving Problem Solving: 3-Solves basic 50 - 74% of the time/requires cueing 25 - 49% of the time  Memory Memory: 2-Recognizes or recalls 25 - 49% of the time/requires cueing 51 - 75% of the time  Medical Problem List and Plan:  Embolic bilateral BG CVA's, left thalamus CVA, CHF  1. 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 Pain Management: Will monitor for signs of distress. Tylenol prn.  3. Mood: No signs of distress but question awareness/insight into deficits. Continue celexa. Will have LCSW follow along for evaluation/support as indicated.  4. Neuropsych: This patient is capable of making decisions on her own behalf.  5. LVAD: Cardiology to follow for management/support.   -weights, I's and O's being followed closely 6. Urinary retention/Hematuria:  Started on flomax. PVR/cath prn.  7. Severe dysphagia/oral apraxia: Continue panda tube.   50 cc/hr with fluid bolus.  8. Trach dependent: Tolerating CFS #6. Continue to suction as needed pulmonary toilet- looks better today -trials of PMV with SLP, wean as pulmonary secretions/status allow, possible downsize to #4 this week, discuss with pulm 9.  Anemia with hemolysis ? r/t pump, monitor, elevated LDH  LOS (Days) 5 A FACE TO FACE EVALUATION WAS PERFORMED  Kaithlyn Teagle E 08/16/2013, 6:44 AM

## 2013-08-16 NOTE — Progress Notes (Signed)
Occupational Therapy Session Note  Patient Details  Name: [name] MRN: [id number] Date of Birth: Mar 29, 1961  Today's Date: 08/16/2013 Time: 4193-7902 Time Calculation (min): 57 min  Short Term Goals: Week 1:  OT Short Term Goal 1 (Week 1): Pt will complete bathing with mod assist  OT Short Term Goal 2 (Week 1): Pt will complete UB dressing with mod assist OT Short Term Goal 3 (Week 1): Pt will complete LB dressing with max assist OT Short Term Goal 4 (Week 1): Pt will utilize LUE as stabilizer to assist with changing LVAD hookup from wall power to battery OT Short Term Goal 5 (Week 1): Pt will complete toilet transfer with mod assist  Skilled Therapeutic Interventions/Progress Updates:    Engaged in ADL retraining with focus on bed mobility, sitting balance, standing balance, transfers, and functional use of LUE during self-care tasks of bathing and dressing. Pt in bed upon arrival, initially refusing therapy session but able to encourage pt participate. Completed UB bathing at EOB while still hooked up to wall power for his LVAD to decrease number of wires around chest. Pt able to wash RUE with Lt hand this session with ability to reach to biceps, required hand over hand assist to wash armpit. Bathed LB with pt utilizing Lt hand to wash Lt thigh and able to cross leg over opposite knee to engage in washing feet.  Sit > stand with +2 for safety due to excess wires and tubes for LB dressing. Pt attempted to pull pants up with Rt hand, but required assist to pull up pants on Lt. Static standing with min assist. Squat pivot bed > w/c to Rt with mod assist. Pt's girlfriend and mother arrived at end of session.    Therapy Documentation Precautions:  Precautions Precautions: Fall Precaution Comments: LVAD, PICC line, NG tube, trach on supplemental 02; Temp greater than or equal to 100.0 degrees fahrenheit PO. Doppler MAP less than 60 or greater than 90; Heart Rate less than 50 or greater than  120; Urine Output less than 240cc in 8 hours or less than 30 cc/hr; O2 saturation less than 90%; CVP greater than 18 or less than 6.; NO CHEST COMPRESSIONS Restrictions Weight Bearing Restrictions: No General:   Vital Signs: Therapy Vitals Pulse Rate: 93 Resp: 16 Patient Position, if appropriate: Sitting Oxygen Therapy SpO2: 100 % O2 Device: Trach collar O2 Flow Rate (L/min): 5 L/min FiO2 (%): 28 % Pain:  Pt with no c/o pain  See FIM for current functional status  Therapy/Group: Individual Therapy  Tylon Kemmerling 08/16/2013, 10:30 AM

## 2013-08-16 NOTE — Progress Notes (Addendum)
ANTICOAGULATION CONSULT NOTE  Pharmacy Consult for heparin Indication: LVAD thrombosis  Allergies  Allergen Reactions  . Ace Inhibitors Cough  . Lexapro [Escitalopram Oxalate] Other (See Comments)    somnolence    Patient Measurements: Weight: 190 lb 11.2 oz (86.5 kg)  Vital Signs: Temp: 98.4 F (36.9 C) (02/23 0334) Temp src: Oral (02/23 0334) Pulse Rate: 91 (02/23 0500)  Labs:  Recent Labs  08/14/13 0505 08/15/13 0609 08/16/13 0300 08/16/13 0605  HGB 9.2*  --  9.3*  --   HCT 29.5*  --  29.2*  --   PLT 377  --  357  --   LABPROT 30.1* 24.7*  --  24.0*  INR 3.00* 2.32*  --  2.23*  HEPARINUNFRC  --   --   --  0.43  CREATININE 1.09  --   --   --     The CrCl is unknown because both a height and weight (above a minimum accepted value) are required for this calculation.  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

## 2013-08-16 NOTE — Progress Notes (Signed)
Patient ID: [name], male   DOB: 1960-07-01, 53 y.o.   MRN: [id number] Advanced Heart Failure Rounding Note  Working with rehab. Using PM valve. Writing questions. Moving L arm slightly. Yesterday Heparin was restarted due to increasing LDH.  Denies SOB. No HF.   INR 2.3  LDH 4401>0272 > 1084  VAD interrogated personally. Flow 5.5, Speed 9200 PI 5.9 Power 5.7  Alarms:  None.  4 PI events this am  Filed Vitals:   08/15/13 2330 08/16/13 0334 08/16/13 0500 08/16/13 0635  Pulse: 93 94 91   Temp:  98.4 F (36.9 C)    TempSrc:  Oral    Resp: 18 16 16    Weight:    190 lb 11.2 oz (86.5 kg)  SpO2: 100% 100% 100%     Intake/Output Summary (Last 24 hours) at 08/16/13 0728 Last data filed at 08/16/13 5366  Gross per 24 hour  Intake 3819.67 ml  Output   1050 ml  Net 2769.67 ml   Scheduled Meds: . allopurinol  100 mg Oral Daily  . antiseptic oral rinse  15 mL Mouth Rinse QID  . aspirin  81 mg Per NG tube Daily  . chlorhexidine  15 mL Mouth Rinse BID  . citalopram  10 mg Per Tube Daily  . clopidogrel  75 mg Per Tube Q breakfast  . feeding supplement (PRO-STAT SUGAR FREE 64)  30 mL Per Tube QID  . free water  200 mL Per Tube 4 times per day  . furosemide  40 mg Per Tube BID  . glycopyrrolate  1 mg Per Tube BID  . magnesium oxide  400 mg Per Tube Daily  . pantoprazole sodium  40 mg Per Tube Q1200  . sodium chloride  10-40 mL Intracatheter Q12H  . spironolactone  25 mg Per Tube Daily  . Warfarin - Pharmacist Dosing Inpatient   Does not apply q1800   Continuous Infusions: . feeding supplement (OSMOLITE 1.5 CAL) 1,000 mL (08/16/13 0319)  . heparin 1,300 Units/hr (08/15/13 2051)   PRN Meds:.acetaminophen, bisacodyl, guaiFENesin-dextromethorphan, lidocaine, nitroGLYCERIN, ondansetron (ZOFRAN) IV, sodium chloride  LABS: Basic Metabolic Panel:  Recent Labs  44/03/47 0505 08/16/13 0605  NA 139 137  K 4.0 4.1  CL 98 98  CO2 29 27  GLUCOSE 139* 101*  BUN 21 20  CREATININE 1.09  0.91  CALCIUM 9.0 8.7   Liver Function Tests: No results found for this basename: AST, ALT, ALKPHOS, BILITOT, PROT, ALBUMIN,  in the last 72 hours No results found for this basename: LIPASE, AMYLASE,  in the last 72 hours CBC:  Recent Labs  08/14/13 0505 08/16/13 0300  WBC 4.9 5.3  HGB 9.2* 9.3*  HCT 29.5* 29.2*  MCV 90.8 88.2  PLT 377 357   Cardiac Enzymes: No results found for this basename: CKTOTAL, CKMB, CKMBINDEX, TROPONINI,  in the last 72 hours RADIOLOGY: Dg Chest Portable 1 View  07/17/2013   CLINICAL DATA:  Left-sided chest pain for 3 days, weakness and dizziness.  EXAM: PORTABLE CHEST - 1 VIEW  COMPARISON:  08/03/2012  FINDINGS: Evidence of median sternotomy noted with LVAD in place. Left-sided defibrillator noted. Mild prominence of the cardiac silhouette persists. Tricuspid valvuloplasty reidentified. Trace left pleural fluid or thickening persists. No new pulmonary opacity.  IMPRESSION: No new acute abnormality. Stable appearance of trace left pleural fluid or thickening.   Electronically Signed   By: Christiana Pellant M.D.   On: 07/17/2013 10:12    PHYSICAL EXAM-  General: NAD; on  trach collar; sitting up in bed working with speech Neck: JVP 7-8; trach collar; clear secretions Lungs: coarse throughout CV: LVAD pump hum normal.   Abdomen: Nontender no hepatosplenomegaly, no distention. + BS Neurologic: awake interactive.  Moving left leg, slight movement left hand.   Extremities: No clubbing or cyanosis.  1+ edema RUE PICC    Assessment:   1. Acute embolic CVA 2. 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  3. 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 NICM EF 15%  5. Anemia 6. Respiratory failure s/p trach    Plan/Discussion:    Volume status looks good. Continue lasix 40 mg bid.   LDH now trending down on IV heparin. INR therapeutic INR, Plavix and asa 81. Continue Heparin.   Would eventually need pump exchange.   VAD interrogated  personally.   CLEGG,AMY NP-C  7:28 AM  Advanced Heart Failure Team Pager [phone] (M-F; 7a - 4p)  Please contact [facility] for night-coverage after hours (4p -7a ) and weekends on [email or web address]  Patient seen and examined with Tonye BecketAmy Clegg, NP. We discussed all aspects of the encounter. I agree with the assessment and plan as stated above.   Continues to improve with Rehab. Now much more functional.   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 LDH coming back down on heparin. Will continue for now. Hemolysis is indolent and no signs/sx of HF currently. That said, will likely need to consider pump exchange in the near future once he progresses a bit more with rehab. Will d/w Drs Donata ClayVan Trigt and [place].   VAD interrogate personally.  Daniel Bensimhon,MD 3:16 PM

## 2013-08-16 NOTE — Progress Notes (Signed)
Patient continues to have blood tinged secretions but now thick and adherent. Will decrease Robinul to daily to help with mobilization and prevent occlusion of trach.

## 2013-08-16 NOTE — Progress Notes (Signed)
Physical Therapy Session Note  Patient Details  Name: [name] MRN: [id number] Date of Birth: [birth year]  Today's Date: 08/16/2013 Time: 4585-9292 and 1400-1445 Time Calculation (min): 59 min and 45 min  Short Term Goals: Week 1:  PT Short Term Goal 1 (Week 1): patient will perform supine to sit with HOB raised and rails with min assist PT Short Term Goal 2 (Week 1): Patient will perform sit to stand and stand turn transfer with min assist PT Short Term Goal 3 (Week 1): Patient will ambulate with +2 hand held min assist 25 feet  PT Short Term Goal 4 (Week 1): Patient will propel wheelchair using hemi technique 50 feet with min assist  Skilled Therapeutic Interventions/Progress Updates:   Pt mother, fiance and caregiver present but did not stay for therapy session.  Pt set up to be transported to gym in w/c.  Once in gym performed gait assessment with RW with hand orthosis on L.  Performed gait x 150' with RW and +2 A to manage all leads/lines/tubes but pt performing 80% with step through gait sequence but noted to have shortened L step length and decreased weight shift to R; pt also noted to be easily distracted and maintained gaze down and to the R or on R wall causing RW to steer to the R.  Verbal cues needed for forward gaze and attention to RW and gait.  Continued gait training without RW to focus on weight shifting, pivoting/changes in direction with figure 8 walking around cones beginning with cones farther apart >> closer together for tighter turns and lateral stepping to L and R x 30' each direction with min-mod HHA and verbal cues for sequencing; second person to manage IV pole and 02 tank.  Pt required multiple rest breaks to assess vitals and because of productive cough.  Returned to room with RW with min-mod A (second person following with w/c) again with mod-max verbal cues for forward gaze and attention to gait and for full step length LLE.  Returned to w/c in room; pt left with all  items within reach.    PM session:  Pt set up with portable 02 and transported to gym in w/c total A.  Performed gait training x 50' with mod R HHA while kicking bean bag down floor with LLE to facilitate full R lateral weight shifting, LLE advancement during swing phase.  Performed balance and pre-gait training with alternating LE soccer ball kicks to wall with HHA for balance to focus on lateral weight shifting, foot clearance/advancement and stance phase of gait.  Performed gait with mod HHA back to w/c x 50' with improved upright gaze, bilat step and stride length, LLE foot clearance and velocity overall.  Back in room pt assisted and directed change of LVAD from battery packs to wall power with use of hand gestures.    Therapy Documentation Precautions:  Precautions Precautions: Fall Precaution Comments: LVAD, PICC line, NG tube, trach on supplemental 02; Temp greater than or equal to 100.0 degrees fahrenheit PO. Doppler MAP less than 60 or greater than 90; Heart Rate less than 50 or greater than 120; Urine Output less than 240cc in 8 hours or less than 30 cc/hr; O2 saturation less than 90%; CVP greater than 18 or less than 6.; NO CHEST COMPRESSIONS Restrictions Weight Bearing Restrictions: No Vital Signs: Therapy Vitals Pulse Rate: 98 Resp: 16 Patient Position, if appropriate: Sitting Oxygen Therapy SpO2: 99 % O2 Device: Trach collar O2 Flow Rate (L/min): 5 L/min  FiO2 (%): 28 % Pulse Oximetry Type: Continuous Pain: Pain Assessment Pain Assessment: No/denies pain Locomotion : Ambulation Ambulation/Gait Assistance: 1: +2 Total assist Wheelchair Mobility Distance: 150   See FIM for current functional status  Therapy/Group: Individual Therapy  Edman CircleHall, Lasha Echeverria [facility] 08/16/2013, 12:18 PM

## 2013-08-16 NOTE — Progress Notes (Signed)
Speech Language Pathology Daily Session Note  Patient Details  Name: [name] MRN: [id number] Date of Birth: 19-Sep-1960  Today's Date: 08/16/2013 Time: 1696-7893 Time Calculation (min): 41 min  Short Term Goals: Week 1: SLP Short Term Goal 1 (Week 1): Patient will tolerate PMSV for 1 hour with clinician supervision  SLP Short Term Goal 2 (Week 1): Patient will self-monitor and manage secreations with Mod clinician cues. SLP Short Term Goal 3 (Week 1): Patient will utilize speech intelligibility compensatory strategies with Mod clinician cues. SLP Short Term Goal 4 (Week 1): Patient will consume trials of ice chips with minimal overt s/s of aspiration with 50% of trials and Mod clinician cues.  Skilled Therapeutic Interventions: Skilled treatment focused on swallowing and communication goals. SLP facilitated session with placement of PMSV for ~30 minutes. During that time, pt achieved brief (<1 second) phonation x3 with Max multimodal cues to facilitate. He required Max cues to cough and clear his secretions, which resulted in PMSV being coughed off x3 and tracheal expectoration of secretions. Pt unable to clear through his mouth despite cueing. Pt performed oral care with suction with supervision level verbal cues to keep finger in place to keep suction continuous. During oral care, pt's SpO2 dropped quickly to 74%. SLP removed valve and suction from mouth, and provided Min cues for deep breaths. Pt was able to recover to 100% quickly. After oral care, pt consumed single ice chip trials with Max cues for throat clearing due to wet vocal quality noted consistently throughout trials. Pt exhibited a reflexive cough x1, which was followed by a drop in SpO2 to the low 90s, which returned to 100% quickly as the valve was coughed off of the trach hub. Continue plan of care.   FIM:  Comprehension Comprehension Mode: Auditory Comprehension: 5-Follows basic conversation/direction: With extra  time/assistive device Expression Expression Mode: Verbal Expression Assistive Devices: 6-Talk trach valve Expression: 2-Expresses basic 25 - 49% of the time/requires cueing 50 - 75% of the time. Uses single words/gestures. Social Interaction Social Interaction: 5-Interacts appropriately 90% of the time - Needs monitoring or encouragement for participation or interaction. Problem Solving Problem Solving: 4-Solves basic 75 - 89% of the time/requires cueing 10 - 24% of the time Memory Memory: 4-Recognizes or recalls 75 - 89% of the time/requires cueing 10 - 24% of the time FIM - Eating Eating Activity: 5: Needs verbal cues/supervision (with ice chip trials)  Pain Pain Assessment Pain Assessment: No/denies pain  Therapy/Group: Individual Therapy   Maxcine Ham, M.A. CCC-SLP [phone]   Maxcine Ham 08/16/2013, 12:50 PM

## 2013-08-16 NOTE — Progress Notes (Signed)
ANTICOAGULATION CONSULT NOTE  Pharmacy Consult for heparin, warfarin Indication: LVAD thrombosis  Allergies  Allergen Reactions  . Ace Inhibitors Cough  . Lexapro [Escitalopram Oxalate] Other (See Comments)    somnolence    Patient Measurements: Weight: 190 lb 11.2 oz (86.5 kg)  Vital Signs: Temp: 98.4 F (36.9 C) (02/23 0334) Temp src: Oral (02/23 0334) Pulse Rate: 93 (02/23 0913)  Labs:  Recent Labs  08/14/13 0505 08/15/13 0609 08/16/13 0300 08/16/13 0605  HGB 9.2*  --  9.3*  --   HCT 29.5*  --  29.2*  --   PLT 377  --  357  --   LABPROT 30.1* 24.7*  --  24.0*  INR 3.00* 2.32*  --  2.23*  HEPARINUNFRC  --   --   --  0.43  CREATININE 1.09  --   --  0.91    The CrCl is unknown because both a height and weight (above a minimum accepted value) are required for this calculation.  Assessment: 53 yo M with LVAD admitted 07/17/2013 with elevated trop/NSTEMI.  Pharmacy consulted to dose heparin for possible pump thrombosis  Events: LDH down slightly on heparin,   PMH: CHF, mitral regurg, tobacco, HTN, AICD, GERD, CKD, mitral stenosis, hepatomegaly, HLD, OSA, gout, bipolar, depression, LVAD in 2012  Coag: LVAD, ACS, pump thrombosis - heparin started on admit> Heparin held for bleeding from mouth after intubation/nose improved -heparin stopped 2/16 after INR > 2 x48hr - surgery feels pt too high risk for PEG - panda clotted - exchanged 2/19 > heparin restarted 2/22,   PTA Warfarin: 5 qd 6 Fri but took 10mg  for 2-3 days pta for INR 1.2 week pta (took extra Coum Thu/Fri pta due to low INR) CV: MAP 100s -RV failure. LDH up 1000s - discussion with [facility] about pump exchange in near future- ASA81 and plavix, add spiro, continue lasix 40 po bid  Wt: 190.11 (admit 183 lb)  PTA Med Issues: - slowly restart BP meds  Disposition: to CIR for rehab  Goal of Therapy:  INR 2-3 Monitor platelets by anticoagulation protocol: Yes Heparin level = 0.3-0.5  Plan:  Warfarin 6 mg  today Continue Heparin at current rate Recheck level this afternoon to verify   Thank you for allowing pharmacy to be a part of this patients care team.  Lovenia Kim Pharm.D., BCPS Clinical Pharmacist 08/16/2013 11:32 AM Pager: [phone] Phone: [phone]

## 2013-08-17 ENCOUNTER — Inpatient Hospital Stay (HOSPITAL_COMMUNITY): Payer: Medicare Other | Admitting: Speech Pathology

## 2013-08-17 ENCOUNTER — Inpatient Hospital Stay (HOSPITAL_COMMUNITY): Payer: Medicare Other

## 2013-08-17 ENCOUNTER — Encounter (HOSPITAL_COMMUNITY): Payer: Medicare Other | Admitting: Occupational Therapy

## 2013-08-17 ENCOUNTER — Inpatient Hospital Stay (HOSPITAL_COMMUNITY): Payer: Medicare Other | Admitting: Physical Therapy

## 2013-08-17 LAB — CBC
HCT: 29.8 % — ABNORMAL LOW (ref 39.0–52.0)
Hemoglobin: 9.4 g/dL — ABNORMAL LOW (ref 13.0–17.0)
MCH: 27.8 pg (ref 26.0–34.0)
MCHC: 31.5 g/dL (ref 30.0–36.0)
MCV: 88.2 fL (ref 78.0–100.0)
PLATELETS: 374 10*3/uL (ref 150–400)
RBC: 3.38 MIL/uL — ABNORMAL LOW (ref 4.22–5.81)
RDW: 15.5 % (ref 11.5–15.5)
WBC: 5.8 10*3/uL (ref 4.0–10.5)

## 2013-08-17 LAB — HEPARIN LEVEL (UNFRACTIONATED): HEPARIN UNFRACTIONATED: 0.5 [IU]/mL (ref 0.30–0.70)

## 2013-08-17 LAB — GLUCOSE, CAPILLARY: GLUCOSE-CAPILLARY: 109 mg/dL — AB (ref 70–99)

## 2013-08-17 LAB — PROTIME-INR
INR: 2.49 — ABNORMAL HIGH (ref 0.00–1.49)
PROTHROMBIN TIME: 26.1 s — AB (ref 11.6–15.2)

## 2013-08-17 MED ORDER — PRO-STAT SUGAR FREE PO LIQD
30.0000 mL | Freq: Two times a day (BID) | ORAL | Status: DC
  Administered 2013-08-17 – 2013-08-23 (×12): 30 mL
  Filled 2013-08-17 (×14): qty 30

## 2013-08-17 MED ORDER — FUROSEMIDE 40 MG PO TABS
40.0000 mg | ORAL_TABLET | Freq: Every day | ORAL | Status: DC
  Administered 2013-08-18 – 2013-08-23 (×6): 40 mg
  Filled 2013-08-17 (×7): qty 1

## 2013-08-17 MED ORDER — SODIUM CHLORIDE 0.9 % IV SOLN
INTRAVENOUS | Status: DC
  Administered 2013-08-17 – 2013-08-19 (×2): via INTRAVENOUS
  Administered 2013-08-23: 10 mL/h via INTRAVENOUS

## 2013-08-17 MED ORDER — WARFARIN SODIUM 5 MG PO TABS
5.0000 mg | ORAL_TABLET | Freq: Once | ORAL | Status: AC
  Administered 2013-08-17: 5 mg via ORAL
  Filled 2013-08-17: qty 1

## 2013-08-17 MED ORDER — JEVITY 1.2 CAL PO LIQD
1000.0000 mL | ORAL | Status: DC
  Administered 2013-08-17 – 2013-08-19 (×3): 1000 mL
  Administered 2013-08-21 – 2013-08-22 (×2)
  Administered 2013-08-23: 1000 mL
  Filled 2013-08-17 (×13): qty 1000

## 2013-08-17 MED ORDER — FREE WATER
150.0000 mL | Freq: Four times a day (QID) | Status: DC
  Administered 2013-08-17 – 2013-08-23 (×23): 150 mL

## 2013-08-17 NOTE — Progress Notes (Signed)
Occupational Therapy Session Note  Patient Details  Name: [name] MRN: [id number] Date of Birth: [birth year]  Today's Date: 08/17/2013 Time: 6945-0388 Time Calculation (min): 30 min  Short Term Goals: Week 1:  OT Short Term Goal 1 (Week 1): Pt will complete bathing with mod assist  OT Short Term Goal 2 (Week 1): Pt will complete UB dressing with mod assist OT Short Term Goal 3 (Week 1): Pt will complete LB dressing with max assist OT Short Term Goal 4 (Week 1): Pt will utilize LUE as stabilizer to assist with changing LVAD hookup from wall power to battery OT Short Term Goal 5 (Week 1): Pt will complete toilet transfer with mod assist  Skilled Therapeutic Interventions/Progress Updates:  Therapeutic activity with focus on L-UE edema control and gross strengthening of left.  Patient received in his w/c near bedside, L-UE in dependent position, mildly edatemous, resting below waistline.   Patient was educated on benefit of edema control for improved L-UE function and was fitted with a L-UE mild compression sleeve, his w/c was fitted with left UE half-lap tray, and patient was instructed on AAROM, retrograde massage, and elevation of extremity to reduce edema (literature provided).  Patient reported intact light touch and demo'd AROM of fingers, wrist, elbow and shoulder, with tolerance of mild resistance with shoulder and elbow.   Patient was then educated on finger AROM exercises (six-pack), completing 1 of 6 exercises this session with moderate assist to block MCP flexion while performing "hook" fist.    Therapy Documentation Precautions:  Precautions Precautions: Fall Precaution Comments: LVAD, PICC line, NG tube, trach on supplemental 02; Temp greater than or equal to 100.0 degrees fahrenheit PO. Doppler MAP less than 60 or greater than 90; Heart Rate less than 50 or greater than 120; Urine Output less than 240cc in 8 hours or less than 30 cc/hr; O2 saturation less than 90%; CVP greater  than 18 or less than 6.; NO CHEST COMPRESSIONS Restrictions Weight Bearing Restrictions: No  Vital Signs: Therapy Vitals Pulse Rate: 92 Resp: 16 Patient Position, if appropriate: Lying Oxygen Therapy SpO2: 100 % O2 Device: Trach collar O2 Flow Rate (L/min): 5 L/min FiO2 (%): 28 %  Pain: No report of pain    See FIM for current functional status  Therapy/Group: Individual Therapy  Deveion Denz 08/17/2013, 3:29 PM

## 2013-08-17 NOTE — Consult Note (Signed)
INITIAL DIAGNOSTIC EVALUATION - CONFIDENTIAL [name] Inpatient Rehabilitation   MEDICAL NECESSITY:  [name] was seen on the [facility] Unit for an initial diagnostic evaluation owing to the patient's diagnosis of stroke.   According to medical records, [name] was admitted to the rehab unit owing to "Functional deficits secondary to Embolic bilateral BG CVA's, left thalamus CVA with dense left hemiparesis." He has a known "h/o NICM EF 10-15%; severe MR. He is s/p LVAD placement at [facility] March 2013, as bridge to transplant." He was admitted for evaluation of new NSTEMI felt possibly to be related to pump thrombosis. He developed acute onset of left sided weakness with aphasia due to acute right MCA embolic CVA per cerebral arteriogram. 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 As such, questions were posed in such a way that he could nonverbally respond yes or no.   During today's visit, [name] denied experiencing any cognitive deficits. He is reportedly in good spirits and has no history of mental health treatment. He feels that he is making strides in therapy and has a good social support system. His only concern is his inability to speak.   PROCEDURES ADMINISTERED: [1 unit [id number] on 08/16/13] Diagnostic clinical interview  Review of available records  Emotional & Behavioral Evaluation: [name] was appropriately dressed for season and situation, and he appeared tidy and well-groomed. Normal posture was noted. He was completely unable to express himself verbally. He seemed able to comprehend what I was saying. His affect was flat. Attention and motivation were adequate.    From an emotional standpoint, [name] denied experiencing any signs of clinical  psychopathology. He denied having any issues adjusting to this admission. However, he did not seem very insightful about the nature and severity of his present medical situation. Suicidal/homicidal ideation, plan or intent was denied. No manic or hypomanic episodes were reported. The patient denied ever experiencing any auditory/visual hallucinations. No major behavioral or personality changes were endorsed.    Overall, [name] denied experiencing any cognitive or emotional symptoms despite his present medical situation. It seems unlikely that he is not suffering from any cognitive sequelae and his lack of report likely evidences poor insight. At this time I recommend giving [name] some time to recover and we will attempt to see him again prior to discharge for brief testing to better assess whether he is having any cognitive impairments.    PLAN: Complete (at least) a mental status exam prior to discharge.    Debbe Mounts, Psy.D.  Clinical Neuropsychologist

## 2013-08-17 NOTE — Progress Notes (Addendum)
ANTICOAGULATION CONSULT NOTE  Pharmacy Consult for heparin, warfarin Indication: LVAD thrombosis  Allergies  Allergen Reactions  . Ace Inhibitors Cough  . Lexapro [Escitalopram Oxalate] Other (See Comments)    somnolence    Patient Measurements: Weight: 186 lb 8.2 oz (84.6 kg)  Vital Signs: Pulse Rate: 90 (02/24 0333)  Labs:  Recent Labs  08/15/13 0609 08/16/13 0300 08/16/13 0605 08/16/13 1630 08/17/13 0612  HGB  --  9.3*  --   --  9.4*  HCT  --  29.2*  --   --  29.8*  PLT  --  357  --   --  374  LABPROT 24.7*  --  24.0*  --  26.1*  INR 2.32*  --  2.23*  --  2.49*  HEPARINUNFRC  --   --  0.43 0.61 0.50  CREATININE  --   --  0.91  --   --     The CrCl is unknown because both a height and weight (above a minimum accepted value) are required for this calculation.  Assessment: 53 yo M with LVAD admitted 07/17/2013 with elevated trop/NSTEMI.  Pharmacy consulted to dose heparin for possible pump thrombosis  Events: stable, no LDH today, INR stable  PMH: CHF, mitral regurg, tobacco, HTN, AICD, GERD, CKD, mitral stenosis, hepatomegaly, HLD, OSA, gout, bipolar, depression, LVAD in 2012  Coag: LVAD, ACS, pump thrombosis - heparin started on admit> Heparin held for bleeding from mouth after intubation/nose improved -heparin stopped 2/16 after INR > 2 x48hr - surgery feels pt too high risk for PEG - panda clotted - exchanged 2/19 > heparin restarted 2/22,   PTA Warfarin: 5 qd 6 Fri but took 10mg  for 2-3 days pta for INR 1.2 week pta (took extra Coum Thu/Fri pta due to low INR) CV: MAP 100s -RV failure. LDH up 1000s - discussion with [facility] about pump exchange in near future- ASA81 and plavix, add spiro, continue lasix 40 po bid  Wt: 190.11 (admit 183 lb)  PTA Med Issues: - slowly restart BP meds  Disposition: to CIR for rehab  Goal of Therapy:  INR 2-3 Monitor platelets by anticoagulation protocol: Yes Heparin level = 0.3-0.5  Plan:  Warfarin 5 mg Continue Heparin at  current rate   Thank you for allowing pharmacy to be a part of this patients care team.  Lovenia Kim Pharm.D., BCPS Clinical Pharmacist 08/17/2013 8:31 AM Pager: [phone] Phone: [phone]

## 2013-08-17 NOTE — Progress Notes (Signed)
Patient ID: [name], male   DOB: 05/04/1961, 53 y.o.   MRN: [id number] Advanced Heart Failure Rounding Note  Working with rehab. Using PM valve. Writing questions. Able to lift R arm up to shoulder level. Remains on Heparin due to increasing LDH.  Denies SOB. No HF. Weight down to 189.   INR 2.3  LDH 4098>[id number]>1174 > 1084  VAD interrogated personally. Flow 4.8, Speed 9200 PI 5.8 Power 5.3  Alarms:  None.  3 PI events this am  Filed Vitals:   08/16/13 2041 08/17/13 0101 08/17/13 0333 08/17/13 0630  Pulse: 87 88 90   Temp:      TempSrc:      Resp: 18 19 18    Weight:    186 lb 8.2 oz (84.6 kg)  SpO2: 100% 100% 98%     Intake/Output Summary (Last 24 hours) at 08/17/13 0819 Last data filed at 08/17/13 0600  Gross per 24 hour  Intake  832.7 ml  Output    400 ml  Net  432.7 ml   Scheduled Meds: . allopurinol  100 mg Oral Daily  . antiseptic oral rinse  15 mL Mouth Rinse QID  . aspirin  81 mg Per NG tube Daily  . chlorhexidine  15 mL Mouth Rinse BID  . citalopram  10 mg Per Tube Daily  . clopidogrel  75 mg Per Tube Q breakfast  . feeding supplement (PRO-STAT SUGAR FREE 64)  30 mL Per Tube QID  . free water  200 mL Per Tube 4 times per day  . furosemide  40 mg Per Tube BID  . glycopyrrolate  1 mg Per Tube QPC breakfast  . magnesium oxide  400 mg Per Tube Daily  . pantoprazole sodium  40 mg Per Tube Q1200  . sodium chloride  10-40 mL Intracatheter Q12H  . spironolactone  25 mg Per Tube Daily  . Warfarin - Pharmacist Dosing Inpatient   Does not apply q1800   Continuous Infusions: . feeding supplement (OSMOLITE 1.5 CAL) 1,000 mL (08/17/13 0129)  . heparin 1,200 Units/hr (08/16/13 2330)   PRN Meds:.acetaminophen, bisacodyl, guaiFENesin-dextromethorphan, lidocaine, nitroGLYCERIN, ondansetron (ZOFRAN) IV, sodium chloride  LABS: Basic Metabolic Panel:  Recent Labs  47/82/9502/23/15 0605  NA 137  K 4.1  CL 98  CO2 27  GLUCOSE 101*  BUN 20  CREATININE 0.91  CALCIUM 8.7   Liver  Function Tests: No results found for this basename: AST, ALT, ALKPHOS, BILITOT, PROT, ALBUMIN,  in the last 72 hours No results found for this basename: LIPASE, AMYLASE,  in the last 72 hours CBC:  Recent Labs  08/16/13 0300 08/17/13 0612  WBC 5.3 5.8  HGB 9.3* 9.4*  HCT 29.2* 29.8*  MCV 88.2 88.2  PLT 357 374   Cardiac Enzymes: No results found for this basename: CKTOTAL, CKMB, CKMBINDEX, TROPONINI,  in the last 72 hours RADIOLOGY: Dg Chest Portable 1 View  07/17/2013   CLINICAL DATA:  Left-sided chest pain for 3 days, weakness and dizziness.  EXAM: PORTABLE CHEST - 1 VIEW  COMPARISON:  08/03/2012  FINDINGS: Evidence of median sternotomy noted with LVAD in place. Left-sided defibrillator noted. Mild prominence of the cardiac silhouette persists. Tricuspid valvuloplasty reidentified. Trace left pleural fluid or thickening persists. No new pulmonary opacity.  IMPRESSION: No new acute abnormality. Stable appearance of trace left pleural fluid or thickening.   Electronically Signed   By: Christiana PellantGretchen  Green M.D.   On: 07/17/2013 10:12    PHYSICAL EXAM-  General: NAD; on trach  collar; sitting up in bed working with speech Neck: JVP 6-7; trach collar; clear secretions Lungs: coarse throughout CV: LVAD pump hum normal.   Abdomen: Nontender no hepatosplenomegaly, no distention. + BS Neurologic: awake interactive.  Moving left leg, slight movement left hand.   Extremities: No clubbing or cyanosis.  1+ edema RUE PICC    Assessment:   1. Acute embolic CVA 2. 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  3. 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 NICM EF 15%  5. Anemia 6. Respiratory failure s/p trach    Plan/Discussion:    Volume status looks good. Weight down 4 pounds. Cut back lasix to 40 mg daily.    Check LDH tomorrow. Continue IV heparin. INR therapeutic INR, Plavix and asa 81.  Would eventually need pump exchange.    CLEGG,AMY NP-C  8:19 AM  Advanced  Heart Failure Team Pager [phone] (M-F; 7a - 4p)  Please contact [facility] for night-coverage after hours (4p -7a ) and weekends on [email or web address]  Patient seen and examined with Tonye Becket, NP. We discussed all aspects of the encounter. I agree with the assessment and plan as stated above.   Doing very well with rehab. Continue aggressive therapy. Will cut lasix back to 40 daily. Keep I/Os even. Can hold if needed.  Continue heparin. Watch LDH. Pump exchange discussed with Dr. Donata Clay at [facility] meeting yesterday. Will try to have him do at least 1-2 more weeks of rehab and also give RV time to heal.   VAD interrogated personally.   Quantisha Marsicano,MD 12:10 PM

## 2013-08-17 NOTE — Progress Notes (Addendum)
NUTRITION FOLLOW UP  Intervention:   1.  Enteral nutrition; discontinue Osmolite 1.5 and initiate Jevity 1.2 @ 50 mL/hr continuous.  Advance by 10 mL q 4 hrs to 60 mL/hr goal with Prostat BID to provide 1928 kcal, 109g protein, 1180 mL free water. 2.  Free water; decrease free water to 150 mL q 6 hrs to remains consistent with free water provision.  TF + flushes will provide 1780 mL free water.   Nutrition Dx:   Inadequate oral intake, ongoing  Monitor:   1. Enteral nutrition; initiation with tolerance. Pt to meet >/=90% estimated needs with nutrition support.  2. Wt/wt change; monitor trends  Assessment:   Patient with PMH of severe CHF, mitral regurgitation and HTN; [facility], as bridge to transplant; admitted for evaluation of new NSTEMI possibly related to pump thrombosis.  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 Trach placed 2/4.  Pt is not a candidate for PEG placement. Continues with NGT feeds of Osmolite 1.5 @ 50 mL/hr which provides 1800 kcal, 112g protein, 1714 mL free water with current free water boluses of 200 mL 4 times daily.   Wt now 186 lbs.  Discussed with RN who states pt is having very small watery bowel movements.  RN concerned re: constipation.  Pt initially on concentrated formula for fluid restriction, however also getting free water flushes.  Pt may now be able to tolerate a less concentrated, standard formula with fiber such as Jevity 1.2.     Height: Ht Readings from Last 1 Encounters:  07/17/13 5\' 4"  (1.626 m)    Weight Status:   Wt Readings from Last 1 Encounters:  08/17/13 186 lb 8.2 oz (84.6 kg)    Re-estimated needs:  Kcal: 1700-1900  Protein: 100-115 gm  Fluid: per MD  Skin: intact  Diet Order: NPO   Intake/Output Summary (Last 24 hours) at 08/17/13 1543 Last data filed at 08/17/13 1400  Gross per 24 hour  Intake    600 ml  Output   1000 ml  Net    -400 ml    Last BM: 2/23  Labs:   Recent Labs Lab 08/12/13 0510 08/14/13 0505 08/16/13 0605  NA 140 139 137  K 3.7 4.0 4.1  CL 100 98 98  CO2 29 29 27   BUN 19 21 20   CREATININE 0.95 1.09 0.91  CALCIUM 8.7 9.0 8.7  GLUCOSE 148* 139* 101*    CBG (last 3)   Recent Labs  08/16/13 0806 08/16/13 1530 08/16/13 2315  GLUCAP 130* 98 98    Scheduled Meds: . allopurinol  100 mg Oral Daily  . antiseptic oral rinse  15 mL Mouth Rinse QID  . aspirin  81 mg Per NG tube Daily  . chlorhexidine  15 mL Mouth Rinse BID  . citalopram  10 mg Per Tube Daily  . clopidogrel  75 mg Per Tube Q breakfast  . feeding supplement (PRO-STAT SUGAR FREE 64)  30 mL Per Tube QID  . free water  200 mL Per Tube 4 times per day  . [START ON 08/18/2013] furosemide  40 mg Per Tube Daily  . glycopyrrolate  1 mg Per Tube QPC breakfast  . magnesium oxide  400 mg Per Tube Daily  . pantoprazole sodium  40 mg Per Tube Q1200  . sodium chloride  10-40 mL Intracatheter Q12H  . spironolactone  25 mg Per Tube Daily  . warfarin  5 mg Oral ONCE-1800  . Warfarin - Pharmacist Dosing Inpatient   Does not apply q1800    Continuous Infusions: . sodium chloride 20 mL/hr at 08/17/13 1010  . feeding supplement (OSMOLITE 1.5 CAL) 1,000 mL (08/17/13 0129)  . heparin 1,200 Units/hr (08/17/13 1225)    Loyce Dys, MS RD LDN Clinical Inpatient Dietitian Pager: [phone] Weekend/After hours pager: [phone]

## 2013-08-17 NOTE — Progress Notes (Signed)
Subjective/Complaints: 53 y.o. 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 He is s/p LVAD placement at [facility] March 2013, as bridge to transplant. He was admitted for evaluation of new NSTEMI felt possibly to be related to pump thrombosis. Hosp course complicated by episodes of CP and hypotension on 1/24 pm hours requiring pressors. He . 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 Aspiration PNA treated with zosyn. TEE 1/28 without evidence of clot but with RV failure  Extubated on 07/23/13 but has difficulty handling secretions and reintubated on 07/25/13. He developed oropharyngeal bleeding and ENT evaluations did not reveal source of acute bleeding. Trach placed by Dr. Harland Dingwall on 07/28/13 and patient tolerating trach collar. He is NPO due to sever dysphagia but deemed too high risk for PEG placement by IR and CCS. Is tolerating panda tube feeds without difficulty. Trach downsized to CFS # 6 yesterday and PMSV trials attempted. Patient able to cough tracheally.  He developed hematuria with urinary retention and foley placed on 08/07/13. Has had complaints of bladder pain and foley d/c today. 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 Therapies ongoing and patient showing improvement in left hemiparesis. He continues with ideomotor/ oromotor apraxia, severe oropharyngeal dysphagia as well as aphasia   Objective: Awake Denies SOB Coughing up moderate clear secretions Review of Systems - cannot obtain secondary to reduced speech   Vital Signs: Pulse 90, temperature 98.4 F (36.9 C), temperature source Oral, resp. rate 18, weight 84.6 kg (186 lb 8.2 oz), SpO2 98.00%. No results found. Results for orders placed during the hospital encounter of 08/11/13 (from the past 72 hour(s))  GLUCOSE, CAPILLARY     Status: Abnormal   Collection Time    08/14/13  4:15 PM      Result Value Ref Range   Glucose-Capillary 118 (*) 70 - 99 mg/dL  GLUCOSE, CAPILLARY     Status: Abnormal   Collection Time    08/15/13 12:03 AM      Result Value Ref Range   Glucose-Capillary 112 (*) 70 - 99 mg/dL  PROTIME-INR     Status: Abnormal   Collection Time    08/15/13  6:09 AM      Result Value Ref Range   Prothrombin Time 24.7 (*) 11.6 - 15.2 seconds   INR 2.32 (*) 0.00 - 1.49  GLUCOSE, CAPILLARY     Status: Abnormal   Collection Time    08/15/13  7:24 AM      Result Value Ref Range   Glucose-Capillary 126 (*) 70 - 99 mg/dL   Comment 1 Notify RN    GLUCOSE, CAPILLARY     Status: Abnormal   Collection Time    08/15/13  4:40 PM      Result Value Ref Range   Glucose-Capillary 112 (*) 70 - 99 mg/dL  LACTATE DEHYDROGENASE     Status: Abnormal   Collection Time    08/15/13  6:20 PM      Result Value Ref Range   LDH 1174 (*) 94 - 250 U/L  GLUCOSE, CAPILLARY     Status: Abnormal   Collection Time    08/16/13 12:07 AM      Result Value Ref Range   Glucose-Capillary 111 (*) 70 - 99 mg/dL  CBC     Status: Abnormal   Collection Time    08/16/13  3:00 AM  Result Value Ref Range   WBC 5.3  4.0 - 10.5 K/uL   RBC 3.31 (*) 4.22 - 5.81 MIL/uL   Hemoglobin 9.3 (*) 13.0 - 17.0 g/dL   HCT 29.2 (*) 39.0 - 52.0 %   MCV 88.2  78.0 - 100.0 fL   MCH 28.1  26.0 - 34.0 pg   MCHC 31.8  30.0 - 36.0 g/dL   RDW 15.4  11.5 - 15.5 %   Platelets 357  150 - 400 K/uL  BASIC METABOLIC PANEL     Status: Abnormal   Collection Time    08/16/13  6:05 AM      Result Value Ref Range   Sodium 137  137 - 147 mEq/L   Potassium 4.1  3.7 - 5.3 mEq/L   Chloride 98  96 - 112 mEq/L   CO2 27  19 - 32 mEq/L   Glucose, Bld 101 (*) 70 - 99 mg/dL   BUN 20  6 - 23 mg/dL   Creatinine, Ser 0.91  0.50 - 1.35 mg/dL   Calcium 8.7  8.4 - 10.5 mg/dL   GFR calc non Af Amer >90  >90 mL/min   GFR calc Af Amer >90  >90 mL/min   Comment: (NOTE)     The eGFR has been  calculated using the CKD EPI equation.     This calculation has not been validated in all clinical situations.     eGFR's persistently <90 mL/min signify possible Chronic Kidney     Disease.  LACTATE DEHYDROGENASE     Status: Abnormal   Collection Time    08/16/13  6:05 AM      Result Value Ref Range   LDH 1084 (*) 94 - 250 U/L  PROTIME-INR     Status: Abnormal   Collection Time    08/16/13  6:05 AM      Result Value Ref Range   Prothrombin Time 24.0 (*) 11.6 - 15.2 seconds   INR 2.23 (*) 0.00 - 1.49  HEPARIN LEVEL (UNFRACTIONATED)     Status: None   Collection Time    08/16/13  6:05 AM      Result Value Ref Range   Heparin Unfractionated 0.43  0.30 - 0.70 IU/mL   Comment:            IF HEPARIN RESULTS ARE BELOW     EXPECTED VALUES, AND PATIENT     DOSAGE HAS BEEN CONFIRMED,     SUGGEST FOLLOW UP TESTING     OF ANTITHROMBIN III LEVELS.  GLUCOSE, CAPILLARY     Status: Abnormal   Collection Time    08/16/13  8:06 AM      Result Value Ref Range   Glucose-Capillary 130 (*) 70 - 99 mg/dL  GLUCOSE, CAPILLARY     Status: None   Collection Time    08/16/13  3:30 PM      Result Value Ref Range   Glucose-Capillary 98  70 - 99 mg/dL  HEPARIN LEVEL (UNFRACTIONATED)     Status: None   Collection Time    08/16/13  4:30 PM      Result Value Ref Range   Heparin Unfractionated 0.61  0.30 - 0.70 IU/mL   Comment:            IF HEPARIN RESULTS ARE BELOW     EXPECTED VALUES, AND PATIENT     DOSAGE HAS BEEN CONFIRMED,     SUGGEST FOLLOW UP TESTING     OF ANTITHROMBIN  III LEVELS.  GLUCOSE, CAPILLARY     Status: None   Collection Time    08/16/13 11:15 PM      Result Value Ref Range   Glucose-Capillary 98  70 - 99 mg/dL  PROTIME-INR     Status: Abnormal   Collection Time    08/17/13  6:12 AM      Result Value Ref Range   Prothrombin Time 26.1 (*) 11.6 - 15.2 seconds   INR 2.49 (*) 0.00 - 1.49  CBC     Status: Abnormal   Collection Time    08/17/13  6:12 AM      Result Value Ref  Range   WBC 5.8  4.0 - 10.5 K/uL   RBC 3.38 (*) 4.22 - 5.81 MIL/uL   Hemoglobin 9.4 (*) 13.0 - 17.0 g/dL   HCT 15.1 (*) 90.4 - 14.3 %   MCV 88.2  78.0 - 100.0 fL   MCH 27.8  26.0 - 34.0 pg   MCHC 31.5  30.0 - 36.0 g/dL   RDW 15.1  12.8 - 49.9 %   Platelets 374  150 - 400 K/uL  HEPARIN LEVEL (UNFRACTIONATED)     Status: None   Collection Time    08/17/13  6:12 AM      Result Value Ref Range   Heparin Unfractionated 0.50  0.30 - 0.70 IU/mL   Comment:            IF HEPARIN RESULTS ARE BELOW     EXPECTED VALUES, AND PATIENT     DOSAGE HAS BEEN CONFIRMED,     SUGGEST FOLLOW UP TESTING     OF ANTITHROMBIN III LEVELS.     HEENT: thrush Cardio: LVAD hum, no S1 or S2 Resp: normal breath sounds GI: BS positive, Distention and NT Extremity:  No Edema Skin:   Intact and Other noirritation around panda or trach Neuro: Lethargic, Cranial Nerve II-XII normal, Abnormal Sensory difficult to assess secondary to aphasia and Aphasic, Motor 4/5 RUE and RLE, 3- Left UE and 3- LLE Musc/Skel:  Normal GEN NAD   Assessment/Plan: 1. Functional deficits secondary to Bilateral BG embolic infarct with Left HP, Aphasia vs dysarthria which require 3+ hours per day of interdisciplinary therapy in a comprehensive inpatient rehab setting. Physiatrist is providing close team supervision and 24 hour management of active medical problems listed below. Physiatrist and rehab team continue to assess barriers to discharge/monitor patient progress toward functional and medical goals. FIM: FIM - Bathing Bathing Steps Patient Completed: Chest;Left Arm;Abdomen;Right upper leg;Left upper leg;Right lower leg (including foot);Left lower leg (including foot) Bathing: 3: Mod-Patient completes 5-7 62f 10 parts or 50-74%  FIM - Upper Body Dressing/Undressing Upper body dressing/undressing steps patient completed: Thread/unthread right sleeve of pullover shirt/dresss;Put head through opening of pull over shirt/dress Upper  body dressing/undressing: 0: Wears gown/pajamas-no public clothing FIM - Lower Body Dressing/Undressing Lower body dressing/undressing: 1: Total-Patient completed less than 25% of tasks  FIM - Toileting Toileting: 1: Total-Patient completed zero steps, helper did all 3  FIM - Diplomatic Services operational officer Devices: Psychiatrist Transfers: 4-To toilet/BSC: Min A (steadying Pt. > 75%);4-From toilet/BSC: Min A (steadying Pt. > 75%)  FIM - Bed/Chair Transfer Bed/Chair Transfer Assistive Devices: Arm rests Bed/Chair Transfer: 3: Sit > Supine: Mod A (lifting assist/Pt. 50-74%/lift 2 legs);4: Bed > Chair or W/C: Min A (steadying Pt. > 75%);4: Chair or W/C > Bed: Min A (steadying Pt. > 75%)  FIM - Locomotion: Wheelchair Distance: 150 Locomotion:  Wheelchair: 1: Total Assistance/staff pushes wheelchair (Pt<25%) FIM - Locomotion: Ambulation Locomotion: Ambulation Assistive Devices: Walker - Rolling;Orthosis;Other (comment) (HHA) Ambulation/Gait Assistance: 1: +2 Total assist Locomotion: Ambulation: 1: Two helpers  Comprehension Comprehension Mode: Auditory Comprehension: 4-Understands basic 75 - 89% of the time/requires cueing 10 - 24% of the time  Expression Expression Mode: Nonverbal Expression Assistive Devices: 6-Talk trach valve Expression: 1-Expresses basis less than 25% of the time/requires cueing greater than 75% of the time.  Social Interaction Social Interaction: 5-Interacts appropriately 90% of the time - Needs monitoring or encouragement for participation or interaction.  Problem Solving Problem Solving: 3-Solves basic 50 - 74% of the time/requires cueing 25 - 49% of the time  Memory Memory: 2-Recognizes or recalls 25 - 49% of the time/requires cueing 51 - 75% of the time  Medical Problem List and Plan:  Embolic bilateral BG CVA's, left thalamus CVA, CHF  1. LVAD thrombosis/DVT prophylaxis/Anticoagulation: Pharmaceutical: Coumadin, ?heparin pending  LDH  2. Pain Management: Will monitor for signs of distress. Tylenol prn.  3. Mood: No signs of distress but question awareness/insight into deficits. Continue celexa. Will have LCSW follow along for evaluation/support as indicated.  4. Neuropsych: This patient is capable of making decisions on her own behalf.  5. LVAD: Cardiology to follow for management/support.   -weights, I's and O's being followed closely 6. Urinary retention/Hematuria:  Started on flomax. PVR/cath prn.  7. Severe dysphagia/oral apraxia: Continue panda tube.   50 cc/hr with fluid bolus.  8. Trach dependent: Tolerating CFS #6. Continue to suction as needed pulmonary toilet- not ready for #4 per CCM -trials of PMV with SLP, wean as pulmonary secretions/status allow, possible downsize to #4 next week, discuss with CCM 9.  Anemia with hemolysis ? r/t pump, monitor, elevated LDH  LOS (Days) 6 A FACE TO FACE EVALUATION WAS PERFORMED  Valentine Kuechle E 08/17/2013, 7:52 AM

## 2013-08-17 NOTE — Progress Notes (Signed)
Physical Therapy Session Note  Patient Details  Name: [name] MRN: [id number] Date of Birth: 15-Sep-1960  Today's Date: 08/17/2013 Time: 1315-1430 Time Calculation (min): 75 min  Short Term Goals: Week 1:  PT Short Term Goal 1 (Week 1): patient will perform supine to sit with HOB raised and rails with min assist PT Short Term Goal 2 (Week 1): Patient will perform sit to stand and stand turn transfer with min assist PT Short Term Goal 2 - Progress (Week 1): Partly met PT Short Term Goal 3 (Week 1): Patient will ambulate with +2 hand held min assist 25 feet  PT Short Term Goal 3 - Progress (Week 1): Met PT Short Term Goal 4 (Week 1): Patient will propel wheelchair using hemi technique 50 feet with min assist PT Short Term Goal 4 - Progress (Week 1): Discontinued (comment) (pt ambulatory )  Skilled Therapeutic Interventions/Progress Updates:   Pt seated in w/c.  Pt transported to gym in w/c with total A secondary to lack of second person to manage IV pole and 02 tank.  In gym performed stand pivot transfer w/c <> Kinetron min HHA.  Performed bilat LE extensor training and strengthening on Kinetron in sitting x 2 minutes at 40 cm/sec and in standing x 2 minutes x 2 reps with bilat UE support at 30 cm/sec with min A for full L lateral weight shift and full LLE extension.  Continued gait training x 39' with HHA with focus on LLE advancement and foot clearance while stepping over low >> taller obstacles, up/down curb and up/down 5 stairs with one UE support with mod A.  Returned to sitting on mat and performed leisure evaluation with recreation therapist.  Returned to w/c and to room with total A in w/c secondary to fatigue.    Therapy Documentation Precautions:  Precautions Precautions: Fall Precaution Comments: LVAD, PICC line, NG tube, trach on supplemental 02; Temp greater than or equal to 100.0 degrees fahrenheit PO. Doppler MAP less than 60 or greater than 90; Heart Rate less than 50 or  greater than 120; Urine Output less than 240cc in 8 hours or less than 30 cc/hr; O2 saturation less than 90%; CVP greater than 18 or less than 6.; NO CHEST COMPRESSIONS Restrictions Weight Bearing Restrictions: No Vital Signs: Therapy Vitals Pulse Rate: 92 Resp: 16 Patient Position, if appropriate: Lying Oxygen Therapy SpO2: 99 % O2 Device: Trach collar O2 Flow Rate (L/min): 5 L/min FiO2 (%): 28 % Pulse Oximetry Type: Continuous Pain: Pain Assessment Pain Assessment: No/denies pain Locomotion : Ambulation Ambulation/Gait Assistance: 1: +2 Total assist   See FIM for current functional status  Therapy/Group: Individual Therapy  Raylene Everts [facility] 08/17/2013, 4:01 PM

## 2013-08-17 NOTE — Progress Notes (Signed)
Occupational Therapy Session Note  Patient Details  Name: [name] MRN: [id number] Date of Birth: 1961-03-20  Today's Date: 08/17/2013 Time: 1105-1200 Time Calculation (min): 55 min  Short Term Goals: Week 1:  OT Short Term Goal 1 (Week 1): Pt will complete bathing with mod assist  OT Short Term Goal 2 (Week 1): Pt will complete UB dressing with mod assist OT Short Term Goal 3 (Week 1): Pt will complete LB dressing with max assist OT Short Term Goal 4 (Week 1): Pt will utilize LUE as stabilizer to assist with changing LVAD hookup from wall power to battery OT Short Term Goal 5 (Week 1): Pt will complete toilet transfer with mod assist  Skilled Therapeutic Interventions/Progress Updates:  Upon entering room, patient found supine in bed. Patient engaged in bed mobility and sat edge of bed for UB/LB bathing & dressing tasks, patient focusing on functionally using left hand/UE as much as possible. Patient able to cross bilateral legs for washing of feet, but required min assist to cross legs and maintain secondary to stiffness. Patient directed care regarding LVAD hook-up to batteries and donning of vest after bathing & dressing. Patient able to stand with steady assist and maintain dynamic standing balance with steady assist using RW during session. Patient transferred > w/c with RW with min assist. Therapist then engaged patient in left shoulder, elbow, forearm, and wrist AAROM exercises (10 each). At end of session, left patient seated in w/c with all needed items within reach.    Precautions:  Precautions Precautions: Fall Precaution Comments: LVAD, PICC line, NG tube, trach on supplemental 02; Temp greater than or equal to 100.0 degrees fahrenheit PO. Doppler MAP less than 60 or greater than 90; Heart Rate less than 50 or greater than 120; Urine Output less than 240cc in 8 hours or less than 30 cc/hr; O2 saturation less than 90%; CVP greater than 18 or less than 6.; NO CHEST  COMPRESSIONS Restrictions Weight Bearing Restrictions: No  See FIM for current functional status  Therapy/Group: Individual Therapy  Elah Avellino 08/17/2013, 12:03 PM

## 2013-08-17 NOTE — Progress Notes (Signed)
Speech Language Pathology Daily Session Notes  Patient Details  Name: [name] MRN: [id number] Date of Birth: Dec 07, 1960  Today's Date: 08/17/2013 Session 1 Time: 1000-1030 Time Calculation (min): 30 min  Short Term Goals: Week 1: SLP Short Term Goal 1 (Week 1): Patient will tolerate PMSV for 1 hour with clinician supervision  SLP Short Term Goal 2 (Week 1): Patient will self-monitor and manage secreations with Mod clinician cues. SLP Short Term Goal 3 (Week 1): Patient will utilize speech intelligibility compensatory strategies with Mod clinician cues. SLP Short Term Goal 4 (Week 1): Patient will consume trials of ice chips with minimal overt s/s of aspiration with 50% of trials and Mod clinician cues.  Skilled Therapeutic Interventions: Session 1 Skilled treatment session focused on addressing dysphagia and communication goals. SLP facilitated session with placement of PMSV for ~30 minutes; placement ended up being intermittent due to pressure from cough displacing valve from trach hub every 3-5 minutes with tracheal expectoration of secretions.  Patient performed oral care via suctioning with Supervision level verbal cues.  Patient consumed 1-2 ice chips for a total of 5 trials with intermittent delayed cough response; however, given intermittent cough at base difficult to truly assess at bedside recommend object assessment.     Session 2 Skilled treatment session focused on addressing communication goals. SLP facilitated session with placement of PMSV for ~25 minutes with cough x2 during session and no evidence of CO2 retention.  Patient required Max encouragement to volitionally initiate vocal cord adduction with throat clears/coughs and required Max verbal cues to for increased voicing with automatic speech. Speech intelligibility is remains impaired due to difficulty initiating voicing; suspect motor involvement as well as due to breathy, horse voicing due to suspected weakness.  Vital  fluctuated throughout session, but remained WFL.  As a result, it is recommend that he initiate PMSV use with full staff supervision during therapies tomorrow, 08/17/13.  FIM:  Comprehension Comprehension Mode: Auditory Comprehension: 5-Follows basic conversation/direction: With extra time/assistive device Expression Expression Mode: Verbal Expression Assistive Devices: 6-Talk trach valve Expression: 2-Expresses basic 25 - 49% of the time/requires cueing 50 - 75% of the time. Uses single words/gestures. Social Interaction Social Interaction: 5-Interacts appropriately 90% of the time - Needs monitoring or encouragement for participation or interaction. Problem Solving Problem Solving: 4-Solves basic 75 - 89% of the time/requires cueing 10 - 24% of the time Memory Memory: 4-Recognizes or recalls 75 - 89% of the time/requires cueing 10 - 24% of the time FIM - Eating Eating Activity: 5: Needs verbal cues/supervision (with ice chip trials)  Pain Session 1 Pain Assessment Pain Assessment: No/denies pain Session 2 Pain Assessment Pain Assessment: No/denies pain  Therapy/Group: Individual Therapy  Charlane Ferretti., CCC-SLP [phone]  Shirleen Mcfaul 08/17/2013, 4:40 PM

## 2013-08-18 ENCOUNTER — Inpatient Hospital Stay (HOSPITAL_COMMUNITY): Payer: Medicare Other | Admitting: Physical Therapy

## 2013-08-18 ENCOUNTER — Inpatient Hospital Stay (HOSPITAL_COMMUNITY): Payer: Medicare Other | Admitting: Speech Pathology

## 2013-08-18 ENCOUNTER — Inpatient Hospital Stay (HOSPITAL_COMMUNITY): Payer: Medicare Other | Admitting: Occupational Therapy

## 2013-08-18 LAB — CBC
HEMATOCRIT: 28.8 % — AB (ref 39.0–52.0)
Hemoglobin: 9 g/dL — ABNORMAL LOW (ref 13.0–17.0)
MCH: 27.6 pg (ref 26.0–34.0)
MCHC: 31.3 g/dL (ref 30.0–36.0)
MCV: 88.3 fL (ref 78.0–100.0)
Platelets: 360 10*3/uL (ref 150–400)
RBC: 3.26 MIL/uL — ABNORMAL LOW (ref 4.22–5.81)
RDW: 15.5 % (ref 11.5–15.5)
WBC: 4.8 10*3/uL (ref 4.0–10.5)

## 2013-08-18 LAB — BASIC METABOLIC PANEL
BUN: 21 mg/dL (ref 6–23)
CALCIUM: 9 mg/dL (ref 8.4–10.5)
CHLORIDE: 97 meq/L (ref 96–112)
CO2: 29 mEq/L (ref 19–32)
Creatinine, Ser: 0.95 mg/dL (ref 0.50–1.35)
Glucose, Bld: 115 mg/dL — ABNORMAL HIGH (ref 70–99)
Potassium: 4 mEq/L (ref 3.7–5.3)
Sodium: 138 mEq/L (ref 137–147)

## 2013-08-18 LAB — GLUCOSE, CAPILLARY
GLUCOSE-CAPILLARY: 111 mg/dL — AB (ref 70–99)
GLUCOSE-CAPILLARY: 105 mg/dL — AB (ref 70–99)
Glucose-Capillary: 107 mg/dL — ABNORMAL HIGH (ref 70–99)

## 2013-08-18 LAB — HEPARIN LEVEL (UNFRACTIONATED): Heparin Unfractionated: 0.48 IU/mL (ref 0.30–0.70)

## 2013-08-18 LAB — PROTIME-INR
INR: 2.72 — ABNORMAL HIGH (ref 0.00–1.49)
Prothrombin Time: 27.9 seconds — ABNORMAL HIGH (ref 11.6–15.2)

## 2013-08-18 LAB — LACTATE DEHYDROGENASE: LDH: 1234 U/L — ABNORMAL HIGH (ref 94–250)

## 2013-08-18 MED ORDER — WARFARIN SODIUM 2.5 MG PO TABS
2.5000 mg | ORAL_TABLET | Freq: Once | ORAL | Status: AC
  Administered 2013-08-18: 2.5 mg via ORAL
  Filled 2013-08-18: qty 1

## 2013-08-18 NOTE — Patient Care Conference (Signed)
Inpatient RehabilitationTeam Conference and Plan of Care Update Date: 08/18/2013   Time: 11;15 Am    Patient Name: [name]      Medical Record Number: [id number]  Date of Birth: 10/14/1960 Sex: Male         Room/Bed: 4W12C/4W12C-01 Payor Info: Payor: MEDICARE / Plan: MEDICARE PART A AND B / Product Type: *No Product type* /    Admitting Diagnosis: B BG CVA LVAD  Admit Date/Time:  08/11/2013  3:26 PM Admission Comments: No comment available   Primary Diagnosis:  <principal problem not specified> Principal Problem: <principal problem not specified>  Patient Active Problem List   Diagnosis Date Noted  . Tracheostomy status 08/16/2013  . CVA (cerebral infarction) 07/19/2013  . Acute respiratory failure 07/19/2013  . Altered mental status 07/19/2013  . NSTEMI, initial episode of care 07/17/2013  . Supratherapeutic INR 08/18/2012  . Acute hemolysis 07/28/2012  . Hypertension 03/21/2012  . Cough 03/03/2012  . Nausea and vomiting 02/26/2012  . SVT (supraventricular tachycardia) 11/27/2011  . LVAD (left ventricular assist device) present 11/27/2011  . Ventricular tachycardia 11/22/2011  . Bipolar affective disorder 10/22/2011  . Migraine 10/22/2011  . Preventative health care 10/21/2011  . Gastritis, acute 09/04/2011  . Hypotension 08/10/2011  . Cardiogenic shock 07/10/2011  . Acute on chronic systolic heart failure 07/07/2011  . LV (left ventricular) mural thrombus 01/28/2011  . Thrombus 08/06/2010  . TOBACCO ABUSE 07/03/2010  . BACK PAIN 03/10/2009  . MITRAL STENOSIS/ INSUFFICIENCY, NON-RHEUMATIC 09/22/2008  . CONGESTIVE HEART FAILURE 09/22/2008  . SYSTOLIC HEART FAILURE, CHRONIC 09/22/2008  . Other malaise and fatigue 09/16/2008  . ABNORMAL HEART SOUNDS 09/16/2008  . DYSPNEA 09/16/2008  . Hepatomegaly 09/16/2008  . ABNORMAL ECHOCARDIOGRAM 09/16/2008  . ICD-[employer] 09/16/2008    Expected Discharge Date:    Team Members Present: Physician leading  conference: Dr. Claudette LawsAndrew Kirsteins Social Worker Present: Staci AcostaJenny Prevatt, LCSW;Becky Kimbria Camposano, LCSW Nurse Present: Carlean PurlMaryann Barbour, RN PT Present: Edman CircleAudra Hall, PT;Emily Parcell, PT OT Present: Rosalio LoudSarah Hoxie, OT;Kris Gellert, Heath LarkT;James McGuire, OT SLP Present: Fae PippinMelissa Bowie, SLP PPS Coordinator present : Edson SnowballBecky Windsor, Chapman FitchPT;Marie Noel, RN, CRRN     Current Status/Progress Goal Weekly Team Focus  Medical   back on heparin for elevated LDH, planning pump exchange  Maintain medical stability for Rehab  coordinate with cardiology on upcoming medical /surgical plan   Bowel/Bladder   cont/incont of bowel, currently having loose watery stools, tube feeding changed to Jevity 1.2; cont/incont of bladder, uses condom cath at night - incont may be attributed to aphasia/nonverbal  cont of bowel and bladder with min assist  monitor bowel movement, KUB ordered 2/24; offer urinal for toileting q2-3 hrs while awake   Swallow/Nutrition/ Hydration   NPO   PO readiness  object assessment    ADL's   mod assist overall with bathing and transfers,requires increased time and occasional +2 due to multiple leads/lines/tubes  supervision overall, min assist dressing  activity tolerance/endurance, LUE NM re-ed   Mobility   Mod A overall but +2 for safety with multiple leads/lines/tubes  Supervision overall except min A stairs  Activity tolerance/endurance, balance (BERG 24/56), gait, LLE strengthening   Communication   Max assit   Mod assist   increase vocalizations and vocal intensity   Safety/Cognition/ Behavioral Observations  WFL with basic   pain controlled < 3/10  assess pain qshift and prn  Pain     pain controlled < 3/10   assess pain qshift and prn     Skin  skin tear L elbow with allevyn dsg; trach stoma clean  no infection or skin breakdown with mod assist  asses skin qshift and prn; clean trach qshift      *See Care Plan and progress notes for long and short-term goals.  Barriers to Discharge: see  above     Possible Resolutions to Barriers:  see above    Discharge Planning/Teaching Needs:  Home wiht girlfriend, who has been here and attends therapies with pt.  She reports she will provide 24 hr care, leave from school.  Ques how realistic she is      Team Discussion:  Medical issues being managed by MD and Dr Magdalene River tomorrow.  Making progress with arm.  Building strength for future surgery 1-2 weeks.  Pt wants to eat and drink  Revisions to Treatment Plan:  Await MD's decision regarding readiness for surgery   Continued Need for Acute Rehabilitation Level of Care: The patient requires daily medical management by a physician with specialized training in physical medicine and rehabilitation for the following conditions: Daily direction of a multidisciplinary physical rehabilitation program to ensure safe treatment while eliciting the highest outcome that is of practical value to the patient.: Yes Daily medical management of patient stability for increased activity during participation in an intensive rehabilitation regime.: Yes Daily analysis of laboratory values and/or radiology reports with any subsequent need for medication adjustment of medical intervention for : Neurological problems;Other;Cardiac problems  Lucy Chris 08/18/2013, 1:44 PM

## 2013-08-18 NOTE — Progress Notes (Signed)
Physical Therapy Weekly Progress Note  Patient Details  Name: [name] MRN: [id number] Date of Birth: [birth year]  Today's Date: 08/18/2013 Time: 1203-2252 Time Calculation (min): 53 min  Patient has made steady progress and has met 2 of 4 short term goals with w/c goals being discontinued secondary to pt ambulatory on unit when assistance available to manage leads/lines/tubes.  Pt is currently min-mod A for bed mobility with HOB elevated secondary to PANDA, bed <> w/c transfers stand pivot, gait with HHA or RW, and stair negotiation.  Pt continues to require +2 for safe mobility (as reflected in FIM scores) secondary to presence of multiple lines/tubes.    Patient continues to demonstrate the following deficits: L hemiparesis with impaired motor control, timing, sequencing, impaired activity tolerance and cardiovascular endurance, impaired postural control, balance, gait and therefore will continue to benefit from skilled PT intervention to enhance overall performance with activity tolerance, balance, postural control, ability to compensate for deficits, functional use of  left upper extremity and left lower extremity and coordination.  Patient progressing toward long term goals..  Plan of care revisions: Secondary to clot in LVAD pt now scheduled to D/C to [facility] vs. [facility] acute care for LVAD replacement instead of home.  Home LTG D/C.  PT Short Term Goals Week 1:  PT Short Term Goal 1 (Week 1): patient will perform supine to sit with HOB raised and rails with min assist PT Short Term Goal 1 - Progress (Week 1): Met PT Short Term Goal 2 (Week 1): Patient will perform sit to stand and stand turn transfer with min assist PT Short Term Goal 2 - Progress (Week 1): Partly met PT Short Term Goal 3 (Week 1): Patient will ambulate with +2 hand held min assist 25 feet  PT Short Term Goal 3 - Progress (Week 1): Met PT Short Term Goal 4 (Week 1): Patient will propel wheelchair using hemi technique 50 feet  with min assist PT Short Term Goal 4 - Progress (Week 1): Discontinued (comment) (pt ambulatory) Week 2:  PT Short Term Goal 1 (Week 2): Pt will perform bed mobility with supervision  to L and R with HOB elevated  PT Short Term Goal 2 (Week 2): Pt will perform bed <> w/c transfers stand pivot min A to L and R  PT Short Term Goal 3 (Week 2): Pt will perform gait x 150' in controlled environment with mod A of one person PT Short Term Goal 4 (Week 2): Pt will perform up/down 2 stairs with one rail and min A  Skilled Therapeutic Interventions/Progress Updates:   Pt seated in w/c with family present.  Pt now with speaking valve over trach during therapy only.  Pt set up for gait; performed gait x 100' with RW and L hand orthosis with therapist providing min-mod A in controlled environment with second person managing IV pole and 02 tank with good step through gait pattern and one episode of L knee instability; pt does demonstrate LOB laterally during head turns.  While pt rested in arm chair pt began to cough strongly with Sp02 dropping to 70% with secretions expectorating from mouth, not trach.  Speaking valve removed secondary to low Sp02 and pt allowed to rest without valve until Sp02 returned to >90%.  Valve replaced and pt performed rest of session in standing while participating in standing balance Wii bowling activity; as pt fatigued LLE tremors noted.  Returned to sitting in w/c secondary to another strong coughing spell displacing valve with  tracheal expectorations.  Returned to room in w/c total A to suction secretions.  Pt assisted with changing LVAD to wall power prior to stand pivot w/c > bed with min A.  Pt transferred sit > supine min A.      Therapy Documentation Precautions:  Precautions Precautions: Fall Precaution Comments: LVAD, PICC line, NG tube, trach on supplemental 02; Temp greater than or equal to 100.0 degrees fahrenheit PO. Doppler MAP less than 60 or greater than 90; Heart Rate  less than 50 or greater than 120; Urine Output less than 240cc in 8 hours or less than 30 cc/hr; O2 saturation less than 90%; CVP greater than 18 or less than 6.; NO CHEST COMPRESSIONS Restrictions Weight Bearing Restrictions: No Vital Signs: Therapy Vitals Temp: 98.6 F (37 C) Temp src: Oral Pulse Rate: 98 Resp: 19 Patient Position, if appropriate: Standing Oxygen Therapy SpO2: 98 % O2 Device: Trach collar;Other (Comment) (Speaking Valve) O2 Flow Rate (L/min): 5 L/min FiO2 (%): 28 % Pulse Oximetry Type: Continuous Pain: Pain Assessment Pain Assessment: No/denies pain Locomotion : Ambulation Ambulation/Gait Assistance: 1: +2 Total assist   See FIM for current functional status  Therapy/Group: Individual Therapy and Co-Treatment  Raylene Everts [facility] 08/18/2013, 4:23 PM

## 2013-08-18 NOTE — Progress Notes (Signed)
ANTICOAGULATION CONSULT NOTE  Pharmacy Consult for heparin, warfarin Indication: LVAD thrombosis  Allergies  Allergen Reactions  . Ace Inhibitors Cough  . Lexapro [Escitalopram Oxalate] Other (See Comments)    somnolence    Patient Measurements: Weight: 204 lb 2.3 oz (92.6 kg)  Vital Signs: Temp: 98.3 F (36.8 C) (02/25 0519) Temp src: Oral (02/25 0519) Pulse Rate: 88 (02/25 0741)  Labs:  Recent Labs  08/16/13 0300  08/16/13 0605 08/16/13 1630 08/17/13 0612 08/18/13 0500  HGB 9.3*  --   --   --  9.4* 9.0*  HCT 29.2*  --   --   --  29.8* 28.8*  PLT 357  --   --   --  374 360  LABPROT  --   --  24.0*  --  26.1* 27.9*  INR  --   --  2.23*  --  2.49* 2.72*  HEPARINUNFRC  --   < > 0.43 0.61 0.50 0.48  CREATININE  --   --  0.91  --   --  0.95  < > = values in this interval not displayed.  The CrCl is unknown because both a height and weight (above a minimum accepted value) are required for this calculation.  Assessment: 53 yo M with LVAD admitted 07/17/2013 with elevated trop/NSTEMI.  Pharmacy consulted to dose heparin for possible pump thrombosis  Events: INR trending up, note H/H stable down  PMH: CHF, mitral regurg, tobacco, HTN, AICD, GERD, CKD, mitral stenosis, hepatomegaly, HLD, OSA, gout, bipolar, depression, LVAD in 2012  Coag: LVAD, ACS, pump thrombosis - heparin started on admit> Heparin held for bleeding from mouth after intubation/nose improved -heparin stopped 2/16 after INR > 2 x48hr - surgery feels pt too high risk for PEG - panda clotted - exchanged 2/19 > heparin restarted 2/22,   PTA Warfarin: 5 qd 6 Fri but took 10mg  for 2-3 days pta for INR 1.2 week pta (took extra Coum Thu/Fri pta due to low INR) CV: MAP 100s -RV failure. LDH up 1000s - discussion with [facility] about pump exchange in near future- ASA81 and plavix, add spiro, continue lasix 40 po bid  Wt: 204 lb up from 186lb (admit 183 lb)  PTA Med Issues: - slowly restart BP meds  Disposition: to CIR  for rehab  Goal of Therapy:  INR 2-3 Monitor platelets by anticoagulation protocol: Yes Heparin level = 0.3-0.5  Plan:  Warfarin 2.5 mg Continue Heparin at current rate   Thank you for allowing pharmacy to be a part of this patients care team.  Lovenia Kim Pharm.D., BCPS Clinical Pharmacist 08/18/2013 9:53 AM Pager: [phone] Phone: [phone]

## 2013-08-18 NOTE — Progress Notes (Signed)
Recreational Therapy Assessment and Plan  Patient Details  Name: [name] MRN: [id number] Date of Birth: 02-11-1961 Today's Date: 08/18/2013  Rehab Potential: Good ELOS: 2-3 weeks  Assessment Clinical Impression: Problem List:  Patient Active Problem List    Diagnosis  Date Noted   .  CVA (cerebral infarction)  07/19/2013   .  Acute respiratory failure  07/19/2013   .  Altered mental status  07/19/2013   .  NSTEMI, initial episode of care  07/17/2013   .  Supratherapeutic INR  08/18/2012   .  Acute hemolysis  07/28/2012   .  Hypertension  03/21/2012   .  Cough  03/03/2012   .  Nausea and vomiting  02/26/2012   .  SVT (supraventricular tachycardia)  11/27/2011   .  LVAD (left ventricular assist device) present  11/27/2011   .  Ventricular tachycardia  11/22/2011   .  Bipolar affective disorder  10/22/2011   .  Migraine  10/22/2011   .  Preventative health care  10/21/2011   .  Gastritis, acute  09/04/2011   .  Hypotension  08/10/2011   .  Cardiogenic shock  07/10/2011   .  Acute on chronic systolic heart failure  40/98/1191   .  LV (left ventricular) mural thrombus  01/28/2011   .  Thrombus  08/06/2010   .  TOBACCO ABUSE  07/03/2010   .  BACK PAIN  03/10/2009   .  MITRAL STENOSIS/ INSUFFICIENCY, NON-RHEUMATIC  09/22/2008   .  CONGESTIVE HEART FAILURE  09/22/2008   .  SYSTOLIC HEART FAILURE, CHRONIC  09/22/2008   .  Other malaise and fatigue  09/16/2008   .  ABNORMAL HEART SOUNDS  09/16/2008   .  DYSPNEA  09/16/2008   .  Hepatomegaly  09/16/2008   .  ABNORMAL ECHOCARDIOGRAM  09/16/2008   .  ICD-[employer]  09/16/2008    Past Medical History:  Past Medical History   Diagnosis  Date   .  CHF (congestive heart failure)      EF- 10-15   .  Medically noncompliant    .  Mitral regurgitation    .  Tobacco user    .  HTN (hypertension)    .  AICD (automatic cardioverter/defibrillator) present    .  GERD (gastroesophageal reflux disease)    .  Substance abuse     .  Chronic renal insufficiency    .  Syncope    .  Thrombus  08/06/2010   .  SYSTOLIC HEART FAILURE, CHRONIC  09/22/2008     Qualifier: Diagnosis of By: Eric Laws, MD, [name]   .  LV (left ventricular) mural thrombus  01/28/2011   .  ICD - IN SITU  09/16/2008     Qualifier: Diagnosis of By: [name]   .  MITRAL STENOSIS/ INSUFFICIENCY, NON-RHEUMATIC  09/22/2008     Qualifier: Diagnosis of By: Eric Laws, MD, [name]  Hepatomegaly  09/16/2008     Qualifier: Diagnosis of By: [name]   .  High cholesterol  02/26/2012     "at one time"   .  Sleep apnea    .  Exertional dyspnea  02/26/2012   .  History of blood transfusion  08/2011     "when I had heart pump"   .  Migraines    .  COMMON MIGRAINE  06/14/2009     Qualifier: Diagnosis of By: Eric Reichmann MD, [name]   .  History of gout  02/26/2012   .  Depression  08/11/2013     Pt denies   .  Bipolar affective disorder  10/22/2011     pt denies this hx 02/26/2012    Past Surgical History:  Past Surgical History   Procedure  Laterality  Date   .  Cardiac defibrillator placement   ~ 2008   .  Left ventricular assist device   08/2011   .  Radiology with anesthesia  N/A  07/19/2013     Procedure: RADIOLOGY WITH ANESTHESIA; Surgeon: Eric Hickman, MD; Location: [place]; Service: Radiology; Laterality: N/A;   .  Tracheostomy       feinstein    Assessment & Plan  Clinical Impression: [name] is a 53 y.o. 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 He is s/p LVAD placement at [facility] March 2013, as bridge to transplant. He was admitted for evaluation of new NSTEMI felt possibly to be related to pump thrombosis. Hosp course complicated by episodes of CP and hypotension on 1/24 pm hours requiring pressors. He developed acute onset of left sided weakness with aphasia due to acute right MCA embolic CVA per cerebral arteriogram. Aspiration PNA treated with zosyn. TEE 1/28 without evidence of clot but with RV failure   Extubated on 07/23/13 but has difficulty handling secretions and reintubated on 07/25/13. He developed oropharyngeal bleeding and ENT evaluations did not reveal source of acute bleeding. Trach placed by Dr. Harland [name] on 07/28/13 and patient tolerating trach collar. He is NPO due to sever dysphagia but deemed too high risk for PEG placement by IR and CCS. Is tolerating panda tube feeds without difficulty. Trach downsized to CFS # 6 and PMSV trials attempted. Patient able to cough tracheally. He developed hematuria with urinary retention and foley placed on 08/07/13. Has had complaints of bladder pain. 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 Patient showing improvement in left hemiparesis. He continues with ideomotor/ oromotor apraxia, severe oropharyngeal dysphagia as well as aphasia. Rehab team recommending CIR and patient admitted to CIR on 08/11/2013.   Pt presents with decreased activity tolerance, decreased functional mobility, decreased balance, left sided weakness, severe dysphagia Limiting pt's independence with leisure/community pursuits.  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) Leisure Participation Style: With Family/Friends Awareness of Community Resources: Good-identify 3 post discharge leisure resources Psychosocial / Spiritual Social interaction - Mood/Behavior: Cooperative Academic librarian Appropriate for Education?: Yes Recreational Therapy Orientation Orientation -Reviewed with patient: Available activity resources Strengths/Weaknesses Patient Strengths/Abilities:  Willingness to participate;Active premorbidly Patient weaknesses: Physical limitations TR Patient demonstrates impairments in the following area(s): Endurance;Motor;Safety  Plan Rec Therapy Plan Is patient appropriate for Therapeutic Recreation?: Yes Rehab Potential: Good Treatment times per week: Min 1 time per week >20 minutes TR Treatment/Interventions: Adaptive equipment instruction;1:1 session;Balance/vestibular training;Functional mobility training;Community reintegration;Patient/family education;Recreation/leisure participation;Therapeutic activities;UE/LE Coordination activities;Therapeutic exercise  Recommendations for other services: None  Discharge Criteria: Patient will be discharged from TR if patient refuses treatment 3 consecutive times without medical reason.  If treatment goals not met, if there is a change in medical status, if patient makes no progress towards goals or if patient is discharged from hospital.  The above assessment, treatment plan, treatment alternatives and goals were  discussed and mutually agreed upon: by patient  Lyons 08/18/2013, 9:17 AM

## 2013-08-18 NOTE — Progress Notes (Signed)
Patient ID: [name], male   DOB: 07/07/1960, 53 y.o.   MRN: [id number] Advanced Heart Failure Rounding Note  Working with rehab. Using PM valve. Writing questions. Able to lift R arm up to shoulder level. Remains on Heparin due to increasing LDH.   Weight not accurate. Yesterday lasix was cut back to 40 mg daily.   INR 2.7  LDH 8413>[id number]>1174 > 1027>[id number]>1234  VAD interrogated personally. Flow 5.2.8, Speed 9200 PI 6.7 Power 5.6  Alarms:  None.  1 PI events this am  Filed Vitals:   08/17/13 2331 08/18/13 0421 08/18/13 0519 08/18/13 0741  Pulse: 90 94 84 88  Temp:   98.3 F (36.8 C)   TempSrc:   Oral   Resp: 16 18 18 17   Weight:   204 lb 2.3 oz (92.6 kg)   SpO2: 98% 97% 95% 97%    Intake/Output Summary (Last 24 hours) at 08/18/13 1009 Last data filed at 08/18/13 0523  Gross per 24 hour  Intake      0 ml  Output   1900 ml  Net  -1900 ml   Scheduled Meds: . allopurinol  100 mg Oral Daily  . antiseptic oral rinse  15 mL Mouth Rinse QID  . aspirin  81 mg Per NG tube Daily  . chlorhexidine  15 mL Mouth Rinse BID  . citalopram  10 mg Per Tube Daily  . clopidogrel  75 mg Per Tube Q breakfast  . feeding supplement (PRO-STAT SUGAR FREE 64)  30 mL Per Tube BID  . free water  150 mL Per Tube 4 times per day  . furosemide  40 mg Per Tube Daily  . glycopyrrolate  1 mg Per Tube QPC breakfast  . magnesium oxide  400 mg Per Tube Daily  . pantoprazole sodium  40 mg Per Tube Q1200  . sodium chloride  10-40 mL Intracatheter Q12H  . spironolactone  25 mg Per Tube Daily  . warfarin  2.5 mg Oral ONCE-1800  . Warfarin - Pharmacist Dosing Inpatient   Does not apply q1800   Continuous Infusions: . sodium chloride 20 mL/hr at 08/17/13 1010  . feeding supplement (JEVITY 1.2 CAL) 1,000 mL (08/18/13 0113)  . heparin 1,200 Units/hr (08/17/13 1225)   PRN Meds:.acetaminophen, bisacodyl, guaiFENesin-dextromethorphan, lidocaine, nitroGLYCERIN, ondansetron (ZOFRAN) IV, sodium chloride  LABS: Basic  Metabolic Panel:  Recent Labs  64/40/3402/23/15 0605 08/18/13 0500  NA 137 138  K 4.1 4.0  CL 98 97  CO2 27 29  GLUCOSE 101* 115*  BUN 20 21  CREATININE 0.91 0.95  CALCIUM 8.7 9.0   Liver Function Tests: No results found for this basename: AST, ALT, ALKPHOS, BILITOT, PROT, ALBUMIN,  in the last 72 hours No results found for this basename: LIPASE, AMYLASE,  in the last 72 hours CBC:  Recent Labs  08/17/13 0612 08/18/13 0500  WBC 5.8 4.8  HGB 9.4* 9.0*  HCT 29.8* 28.8*  MCV 88.2 88.3  PLT 374 360   Cardiac Enzymes: No results found for this basename: CKTOTAL, CKMB, CKMBINDEX, TROPONINI,  in the last 72 hours RADIOLOGY: Dg Chest Portable 1 View  07/17/2013   CLINICAL DATA:  Left-sided chest pain for 3 days, weakness and dizziness.  EXAM: PORTABLE CHEST - 1 VIEW  COMPARISON:  08/03/2012  FINDINGS: Evidence of median sternotomy noted with LVAD in place. Left-sided defibrillator noted. Mild prominence of the cardiac silhouette persists. Tricuspid valvuloplasty reidentified. Trace left pleural fluid or thickening persists. No new pulmonary opacity.  IMPRESSION: No  new acute abnormality. Stable appearance of trace left pleural fluid or thickening.   Electronically Signed   By: Christiana Pellant M.D.   On: 07/17/2013 10:12    PHYSICAL EXAM-  General: NAD; on trach collar; standing beside the bed with OT.  Neck: JVP 6-7; trach collar; clear secretions Lungs: coarse throughout CV: LVAD pump hum normal.   Abdomen: Nontender no hepatosplenomegaly, no distention. + BS Neurologic: awake interactive.  Moving left leg, slight movement left hand.   Extremities: No clubbing or cyanosis.  1+ edema RUE PICC    Assessment:   1. Acute embolic CVA 2. 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  3. 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 NICM EF 15%  5. Anemia 6. Respiratory failure s/p trach    Plan/Discussion:    Volume status looks good. Weight are not accurate today.  Reweigh.  Continue lasix 40 mg daily for now.  Renal function stable.   LDH continues to trend up. Continue IV heparin. INR therapeutic INR, Plavix and asa 81.  Would eventually need pump exchange.    CLEGG,AMY NP-C  10:09 AM  Advanced Heart Failure Team Pager [phone] (M-F; 7a - 4p)  Please contact [facility] for night-coverage after hours (4p -7a ) and weekends on [email or web address]  Patient seen and examined with Tonye Becket, NP. We discussed all aspects of the encounter. I agree with the assessment and plan as stated above.   Doing very well with rehab. Continue aggressive therapy. Will cut lasix back to 40 daily. Keep I/Os even. Can hold if needed.  Continue heparin. Watch LDH. Pump exchange discussed with Dr. Donata Clay at [facility] meeting yesterday. Will try to have him do at least 1-2 more weeks of rehab and also give RV time to heal.   VAD interrogated personally.   CLEGG,AMY,NP-C  10:09 AM  Patient seen and examined with Tonye Becket, NP. We discussed all aspects of the encounter. I agree with the assessment and plan as stated above. Doing well with rehab. LDH continues to climb despite IV heparin and therapeutic INR. No signs pump dysfunction.   Will need pump exchange in near future. Consider echo to make sure LV still unloading well. I will d/w Dr. Donata Clay again.   Daniel Bensimhon,MD 11:40 AM

## 2013-08-18 NOTE — Progress Notes (Signed)
Subjective/Complaints: 53 y.o. 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 He is s/p LVAD placement at [facility] March 2013, as bridge to transplant. He was admitted for evaluation of new NSTEMI felt possibly to be related to pump thrombosis. Hosp course complicated by episodes of CP and hypotension on 1/24 pm hours requiring pressors. He . 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 Aspiration PNA treated with zosyn. TEE 1/28 without evidence of clot but with RV failure  Extubated on 07/23/13 but has difficulty handling secretions and reintubated on 07/25/13. He developed oropharyngeal bleeding and ENT evaluations did not reveal source of acute bleeding. Trach placed by Dr. Harland Dingwall on 07/28/13 and patient tolerating trach collar. He is NPO due to sever dysphagia but deemed too high risk for PEG placement by IR and CCS. Is tolerating panda tube feeds without difficulty. Trach downsized to CFS # 6 yesterday and PMSV trials attempted. Patient able to cough tracheally.  He developed hematuria with urinary retention and foley placed on 08/07/13. Has had complaints of bladder pain and foley d/c today. 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 Therapies ongoing and patient showing improvement in left hemiparesis. He continues with ideomotor/ oromotor apraxia, severe oropharyngeal dysphagia as well as aphasia   Objective: Awake Denies SOB Coughing up moderate clear secretions Appreciate cardiology note Review of Systems - cannot obtain secondary to reduced speech   Vital Signs: Pulse 88, temperature 98.3 F (36.8 C), temperature source Oral, resp. rate 17, weight 92.6 kg (204 lb 2.3 oz), SpO2 97.00%. Dg Abd Portable 1v  08/17/2013   CLINICAL DATA:  Abdominal distention and discomfort.  EXAM: PORTABLE ABDOMEN - 1 VIEW  COMPARISON:   07/30/2013 and prior studies  FINDINGS: The bowel gas pattern is normal.  No dilated bowel loops are present.  The small bore feeding tube is present with tip overlying the proximal duodenum.  Left ventricular assist device again noted.  IMPRESSION: Unremarkable bowel gas pattern.  No acute abnormalities.   Electronically Signed   By: Hassan Rowan M.D.   On: 08/17/2013 23:52   Results for orders placed during the hospital encounter of 08/11/13 (from the past 72 hour(s))  GLUCOSE, CAPILLARY     Status: Abnormal   Collection Time    08/15/13  4:40 PM      Result Value Ref Range   Glucose-Capillary 112 (*) 70 - 99 mg/dL  LACTATE DEHYDROGENASE     Status: Abnormal   Collection Time    08/15/13  6:20 PM      Result Value Ref Range   LDH 1174 (*) 94 - 250 U/L  GLUCOSE, CAPILLARY     Status: Abnormal   Collection Time    08/16/13 12:07 AM      Result Value Ref Range   Glucose-Capillary 111 (*) 70 - 99 mg/dL  CBC     Status: Abnormal   Collection Time    08/16/13  3:00 AM      Result Value Ref Range   WBC 5.3  4.0 - 10.5 K/uL   RBC 3.31 (*) 4.22 - 5.81 MIL/uL   Hemoglobin 9.3 (*) 13.0 - 17.0 g/dL   HCT 29.2 (*) 39.0 - 52.0 %   MCV 88.2  78.0 - 100.0 fL   MCH 28.1  26.0 - 34.0 pg   MCHC 31.8  30.0 - 36.0 g/dL  RDW 15.4  11.5 - 15.5 %   Platelets 357  150 - 400 K/uL  BASIC METABOLIC PANEL     Status: Abnormal   Collection Time    08/16/13  6:05 AM      Result Value Ref Range   Sodium 137  137 - 147 mEq/L   Potassium 4.1  3.7 - 5.3 mEq/L   Chloride 98  96 - 112 mEq/L   CO2 27  19 - 32 mEq/L   Glucose, Bld 101 (*) 70 - 99 mg/dL   BUN 20  6 - 23 mg/dL   Creatinine, Ser 0.91  0.50 - 1.35 mg/dL   Calcium 8.7  8.4 - 10.5 mg/dL   GFR calc non Af Amer >90  >90 mL/min   GFR calc Af Amer >90  >90 mL/min   Comment: (NOTE)     The eGFR has been calculated using the CKD EPI equation.     This calculation has not been validated in all clinical situations.     eGFR's persistently <90 mL/min  signify possible Chronic Kidney     Disease.  LACTATE DEHYDROGENASE     Status: Abnormal   Collection Time    08/16/13  6:05 AM      Result Value Ref Range   LDH 1084 (*) 94 - 250 U/L  PROTIME-INR     Status: Abnormal   Collection Time    08/16/13  6:05 AM      Result Value Ref Range   Prothrombin Time 24.0 (*) 11.6 - 15.2 seconds   INR 2.23 (*) 0.00 - 1.49  HEPARIN LEVEL (UNFRACTIONATED)     Status: None   Collection Time    08/16/13  6:05 AM      Result Value Ref Range   Heparin Unfractionated 0.43  0.30 - 0.70 IU/mL   Comment:            IF HEPARIN RESULTS ARE BELOW     EXPECTED VALUES, AND PATIENT     DOSAGE HAS BEEN CONFIRMED,     SUGGEST FOLLOW UP TESTING     OF ANTITHROMBIN III LEVELS.  GLUCOSE, CAPILLARY     Status: Abnormal   Collection Time    08/16/13  8:06 AM      Result Value Ref Range   Glucose-Capillary 130 (*) 70 - 99 mg/dL  GLUCOSE, CAPILLARY     Status: None   Collection Time    08/16/13  3:30 PM      Result Value Ref Range   Glucose-Capillary 98  70 - 99 mg/dL  HEPARIN LEVEL (UNFRACTIONATED)     Status: None   Collection Time    08/16/13  4:30 PM      Result Value Ref Range   Heparin Unfractionated 0.61  0.30 - 0.70 IU/mL   Comment:            IF HEPARIN RESULTS ARE BELOW     EXPECTED VALUES, AND PATIENT     DOSAGE HAS BEEN CONFIRMED,     SUGGEST FOLLOW UP TESTING     OF ANTITHROMBIN III LEVELS.  GLUCOSE, CAPILLARY     Status: None   Collection Time    08/16/13 11:15 PM      Result Value Ref Range   Glucose-Capillary 98  70 - 99 mg/dL  PROTIME-INR     Status: Abnormal   Collection Time    08/17/13  6:12 AM      Result Value Ref Range  Prothrombin Time 26.1 (*) 11.6 - 15.2 seconds   INR 2.49 (*) 0.00 - 1.49  CBC     Status: Abnormal   Collection Time    08/17/13  6:12 AM      Result Value Ref Range   WBC 5.8  4.0 - 10.5 K/uL   RBC 3.38 (*) 4.22 - 5.81 MIL/uL   Hemoglobin 9.4 (*) 13.0 - 17.0 g/dL   HCT 29.8 (*) 39.0 - 52.0 %   MCV 88.2   78.0 - 100.0 fL   MCH 27.8  26.0 - 34.0 pg   MCHC 31.5  30.0 - 36.0 g/dL   RDW 15.5  11.5 - 15.5 %   Platelets 374  150 - 400 K/uL  HEPARIN LEVEL (UNFRACTIONATED)     Status: None   Collection Time    08/17/13  6:12 AM      Result Value Ref Range   Heparin Unfractionated 0.50  0.30 - 0.70 IU/mL   Comment:            IF HEPARIN RESULTS ARE BELOW     EXPECTED VALUES, AND PATIENT     DOSAGE HAS BEEN CONFIRMED,     SUGGEST FOLLOW UP TESTING     OF ANTITHROMBIN III LEVELS.  GLUCOSE, CAPILLARY     Status: Abnormal   Collection Time    08/17/13  9:17 PM      Result Value Ref Range   Glucose-Capillary 109 (*) 70 - 99 mg/dL  BASIC METABOLIC PANEL     Status: Abnormal   Collection Time    08/18/13  5:00 AM      Result Value Ref Range   Sodium 138  137 - 147 mEq/L   Potassium 4.0  3.7 - 5.3 mEq/L   Chloride 97  96 - 112 mEq/L   CO2 29  19 - 32 mEq/L   Glucose, Bld 115 (*) 70 - 99 mg/dL   BUN 21  6 - 23 mg/dL   Creatinine, Ser 0.95  0.50 - 1.35 mg/dL   Calcium 9.0  8.4 - 10.5 mg/dL   GFR calc non Af Amer >90  >90 mL/min   GFR calc Af Amer >90  >90 mL/min   Comment: (NOTE)     The eGFR has been calculated using the CKD EPI equation.     This calculation has not been validated in all clinical situations.     eGFR's persistently <90 mL/min signify possible Chronic Kidney     Disease.  LACTATE DEHYDROGENASE     Status: Abnormal   Collection Time    08/18/13  5:00 AM      Result Value Ref Range   LDH 1234 (*) 94 - 250 U/L  PROTIME-INR     Status: Abnormal   Collection Time    08/18/13  5:00 AM      Result Value Ref Range   Prothrombin Time 27.9 (*) 11.6 - 15.2 seconds   INR 2.72 (*) 0.00 - 1.49  CBC     Status: Abnormal   Collection Time    08/18/13  5:00 AM      Result Value Ref Range   WBC 4.8  4.0 - 10.5 K/uL   RBC 3.26 (*) 4.22 - 5.81 MIL/uL   Hemoglobin 9.0 (*) 13.0 - 17.0 g/dL   HCT 28.8 (*) 39.0 - 52.0 %   MCV 88.3  78.0 - 100.0 fL   MCH 27.6  26.0 - 34.0 pg   MCHC  31.3  30.0 - 36.0 g/dL   RDW 51.8  84.1 - 66.0 %   Platelets 360  150 - 400 K/uL  HEPARIN LEVEL (UNFRACTIONATED)     Status: None   Collection Time    08/18/13  5:00 AM      Result Value Ref Range   Heparin Unfractionated 0.48  0.30 - 0.70 IU/mL   Comment:            IF HEPARIN RESULTS ARE BELOW     EXPECTED VALUES, AND PATIENT     DOSAGE HAS BEEN CONFIRMED,     SUGGEST FOLLOW UP TESTING     OF ANTITHROMBIN III LEVELS.  GLUCOSE, CAPILLARY     Status: Abnormal   Collection Time    08/18/13  5:41 AM      Result Value Ref Range   Glucose-Capillary 107 (*) 70 - 99 mg/dL     HEENT: thrush Cardio: LVAD hum, no S1 or S2 Resp: normal breath sounds GI: BS positive, Distention and NT Extremity:  No Edema Skin:   Intact and Other noirritation around panda or trach Neuro: Lethargic, Cranial Nerve II-XII normal, Abnormal Sensory difficult to assess secondary to aphasia and Aphasic, Motor 4/5 RUE and RLE, 3- Left UE and 3- LLE Musc/Skel:  Normal GEN NAD   Assessment/Plan: 1. Functional deficits secondary to Bilateral BG embolic infarct with Left HP, Aphasia vs dysarthria which require 3+ hours per day of interdisciplinary therapy in a comprehensive inpatient rehab setting. Physiatrist is providing close team supervision and 24 hour management of active medical problems listed below. Physiatrist and rehab team continue to assess barriers to discharge/monitor patient progress toward functional and medical goals. FIM: FIM - Bathing Bathing Steps Patient Completed: Chest;Left Arm;Abdomen;Right upper leg;Left upper leg;Right lower leg (including foot);Left lower leg (including foot) Bathing: 3: Mod-Patient completes 5-7 59f 10 parts or 50-74%  FIM - Upper Body Dressing/Undressing Upper body dressing/undressing steps patient completed: Thread/unthread right sleeve of pullover shirt/dresss;Put head through opening of pull over shirt/dress Upper body dressing/undressing: 0: Wears gown/pajamas-no  public clothing FIM - Lower Body Dressing/Undressing Lower body dressing/undressing: 1: Total-Patient completed less than 25% of tasks  FIM - Toileting Toileting: 1: Total-Patient completed zero steps, helper did all 3  FIM - Diplomatic Services operational officer Devices: Psychiatrist Transfers: 4-To toilet/BSC: Min A (steadying Pt. > 75%);4-From toilet/BSC: Min A (steadying Pt. > 75%)  FIM - Bed/Chair Transfer Bed/Chair Transfer Assistive Devices: Arm rests Bed/Chair Transfer: 4: Chair or W/C > Bed: Min A (steadying Pt. > 75%);4: Bed > Chair or W/C: Min A (steadying Pt. > 75%)  FIM - Locomotion: Wheelchair Distance: 150 Locomotion: Wheelchair: 1: Total Assistance/staff pushes wheelchair (Pt<25%) FIM - Locomotion: Ambulation Locomotion: Ambulation Assistive Devices: Other (comment) (HHA) Ambulation/Gait Assistance: 1: +2 Total assist Locomotion: Ambulation: 1: Two helpers (to manage 02 tank and IV pole)  Comprehension Comprehension Mode: Auditory Comprehension: 5-Follows basic conversation/direction: With extra time/assistive device  Expression Expression Mode: Nonverbal Expression Assistive Devices: 6-Talk trach valve Expression: 2-Expresses basic 25 - 49% of the time/requires cueing 50 - 75% of the time. Uses single words/gestures.  Social Interaction Social Interaction: 6-Interacts appropriately with others with medication or extra time (anti-anxiety, antidepressant).  Problem Solving Problem Solving: 4-Solves basic 75 - 89% of the time/requires cueing 10 - 24% of the time  Memory Memory: 5-Recognizes or recalls 90% of the time/requires cueing < 10% of the time  Medical Problem List and Plan:  Embolic bilateral BG CVA's, left thalamus CVA,  CHF  1. 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 Pain Management: Will monitor for signs of distress. Tylenol prn.  3. Mood: No signs of  distress but question awareness/insight into deficits. Continue celexa. Will have LCSW follow along for evaluation/support as indicated.  4. Neuropsych: This patient is capable of making decisions on her own behalf.  5. LVAD: Cardiology to follow for management/support.   -weights, I's and O's being followed closely 6. Urinary retention/Hematuria:  Started on flomax. PVR/cath prn.  7. Severe dysphagia/oral apraxia: Continue panda tube.   50 cc/hr with fluid bolus.  8. Trach dependent: Tolerating CFS #6. Continue to suction as needed pulmonary toilet- not ready for #4 per CCM -trials of PMV with SLP, since pump exchange is planned would hold off on downsize  9.  Anemia with hemolysis ? r/t pump, monitor, elevated LDH  LOS (Days) 7 A FACE TO FACE EVALUATION WAS PERFORMED  Bob Daversa E 08/18/2013, 8:59 AM

## 2013-08-18 NOTE — Progress Notes (Signed)
Recreational Therapy Session Note  Patient Details  Name: [name] MRN: [id number] Date of Birth: 02/08/1961 Today's Date: 08/18/2013  Pain: no c/o Skilled Therapeutic Interventions/Progress Updates: session focused on activity tolerance, ambulation with RW, dynamic standing balance during Wii bowling with min assist and mod instructional cues for learning new task.  Therapy/Group: Co-Treatment  Neesa Knapik 08/18/2013, 4:23 PM

## 2013-08-18 NOTE — Progress Notes (Signed)
Social Work Patient ID: [name], male   DOB: January 31, 1961, 53 y.o.   MRN: [id number] Met with pt and family to inform of team conference progression in therapies and MD working on medical issues. Pt pleased he will have a FEEs tomorrow, main goal is to eat and drink.  He has spoken with MD and has a basic understanding of his condition. Jonelle Sidle continues to be here and assists in pt's care when she can.   Continue to provide support and work on plans.

## 2013-08-18 NOTE — Progress Notes (Signed)
Social Work Lucy Chrisebecca G Denise Bramblett, LCSW Social Worker Signed  Patient Care Conference Service date: 08/18/2013 1:44 PM  Inpatient RehabilitationTeam Conference and Plan of Care Update Date: 08/18/2013   Time: 11;15 Am     Patient Name: [name]       Medical Record Number: [id number]   Date of Birth: 12/09/1960 Sex: Male         Room/Bed: 4W12C/4W12C-01 Payor Info: Payor: MEDICARE / Plan: MEDICARE PART A AND B / Product Type: *No Product type* /   Admitting Diagnosis: B BG CVA LVAD   Admit Date/Time:  08/11/2013  3:26 PM Admission Comments: No comment available   Primary Diagnosis:  <principal problem not specified> Principal Problem: <principal problem not specified>    Patient Active Problem List     Diagnosis  Date Noted   .  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  CVA (cerebral infarction)  07/19/2013   .  Acute respiratory failure  07/19/2013   .  Altered mental status  07/19/2013   .  NSTEMI, initial episode of care  07/17/2013   .  Supratherapeutic INR  08/18/2012   .  Acute hemolysis  07/28/2012   .  Hypertension  03/21/2012   .  Cough  03/03/2012   .  Nausea and vomiting  02/26/2012   .  SVT (supraventricular tachycardia)  11/27/2011   .  LVAD (left ventricular assist device) present  11/27/2011   .  Ventricular tachycardia  11/22/2011   .  Bipolar affective disorder  10/22/2011   .  Migraine  10/22/2011   .  Preventative health care  10/21/2011   .  Gastritis, acute  09/04/2011   .  Hypotension  08/10/2011   .  Cardiogenic shock  07/10/2011   .  Acute on chronic systolic heart failure  07/07/2011   .  LV (left ventricular) mural thrombus  01/28/2011   .  Thrombus  08/06/2010   .  TOBACCO ABUSE  07/03/2010   .  BACK PAIN  03/10/2009   .  MITRAL STENOSIS/ INSUFFICIENCY, NON-RHEUMATIC  09/22/2008   .  CONGESTIVE HEART FAILURE  09/22/2008   .  SYSTOLIC HEART FAILURE, CHRONIC  09/22/2008   .  Other malaise and fatigue  09/16/2008   .  ABNORMAL HEART SOUNDS  09/16/2008    .  DYSPNEA  09/16/2008   .  Hepatomegaly  09/16/2008   .  ABNORMAL ECHOCARDIOGRAM  09/16/2008   .  ICD-[employer]  09/16/2008     Expected Discharge Date:   Team Members Present: Physician leading conference: Dr. Claudette LawsAndrew Kirsteins Social Worker Present: Staci AcostaJenny Prevatt, LCSW;Becky Janese Radabaugh, LCSW Nurse Present: Carlean PurlMaryann Barbour, RN PT Present: Edman CircleAudra Hall, PT;Emily Marya AmslerParcell, PT OT Present: Rosalio LoudSarah Hoxie, OT;Kris Gellert, Heath LarkT;James McGuire, OT SLP Present: Fae PippinMelissa Bowie, SLP PPS Coordinator present : Edson SnowballBecky Windsor, Chapman FitchPT;Marie Noel, RN, CRRN        Current Status/Progress  Goal  Weekly Team Focus   Medical     back on heparin for elevated LDH, planning pump exchange  Maintain medical stability for Rehab  coordinate with cardiology on upcoming medical /surgical plan   Bowel/Bladder     cont/incont of bowel, currently having loose watery stools, tube feeding changed to Jevity 1.2; cont/incont of bladder, uses condom cath at night - incont may be attributed to aphasia/nonverbal  cont of bowel and bladder with min assist  monitor bowel movement, KUB ordered 2/24; offer urinal for toileting q2-3 hrs while awake   Swallow/Nutrition/ Hydration  NPO   PO readiness  object assessment    ADL's     mod assist overall with bathing and transfers,requires increased time and occasional +2 due to multiple leads/lines/tubes  supervision overall, min assist dressing  activity tolerance/endurance, LUE NM re-ed   Mobility     Mod A overall but +2 for safety with multiple leads/lines/tubes  Supervision overall except min A stairs  Activity tolerance/endurance, balance (BERG 24/56), gait, LLE strengthening   Communication     Max assit   Mod assist   increase vocalizations and vocal intensity   Safety/Cognition/ Behavioral Observations    WFL with basic   pain controlled < 3/10  assess pain qshift and prn   Pain     pain controlled < 3/10  assess pain qshift and prn     Skin     skin tear L elbow  with allevyn dsg; trach stoma clean  no infection or skin breakdown with mod assist  asses skin qshift and prn; clean trach qshift     *See Care Plan and progress notes for long and short-term goals.    Barriers to Discharge:  see above      Possible Resolutions to Barriers:    see above      Discharge Planning/Teaching Needs:    Home wiht girlfriend, who has been here and attends therapies with pt.  She reports she will provide 24 hr care, leave from school.  Ques how realistic she is      Team Discussion:    Medical issues being managed by MD and Dr Magdalene River tomorrow.  Making progress with arm.  Building strength for future surgery 1-2 weeks.  Pt wants to eat and drink   Revisions to Treatment Plan:    Await MD's decision regarding readiness for surgery    Continued Need for Acute Rehabilitation Level of Care: The patient requires daily medical management by a physician with specialized training in physical medicine and rehabilitation for the following conditions: Daily direction of a multidisciplinary physical rehabilitation program to ensure safe treatment while eliciting the highest outcome that is of practical value to the patient.: Yes Daily medical management of patient stability for increased activity during participation in an intensive rehabilitation regime.: Yes Daily analysis of laboratory values and/or radiology reports with any subsequent need for medication adjustment of medical intervention for : Neurological problems;Other;Cardiac problems  Lucy Chris 08/18/2013, 1:44 PM          Patient ID: [name], male   DOB: [birth year], 53 y.o.   MRN: [id number]

## 2013-08-18 NOTE — Progress Notes (Signed)
Speech Language Pathology Daily Session Note  Patient Details  Name: [name] MRN: [id number] Date of Birth: Jan 19, 1961  Today's Date: 08/18/2013 Time: 1400-1415 Time Calculation (min): 15 min  Short Term Goals: Week 1: SLP Short Term Goal 1 (Week 1): Patient will tolerate PMSV for 1 hour with clinician supervision  SLP Short Term Goal 2 (Week 1): Patient will self-monitor and manage secreations with Mod clinician cues. SLP Short Term Goal 3 (Week 1): Patient will utilize speech intelligibility compensatory strategies with Mod clinician cues. SLP Short Term Goal 4 (Week 1): Patient will consume trials of ice chips with minimal overt s/s of aspiration with 50% of trials and Mod clinician cues.  Skilled Therapeutic Interventions: Skilled treatment session focused on addressing PMSV goals. SLP facilitated session with placement of PMSV to prepare for FEES assessment.  Patient with intermittent cough displacing valve from trach hub with tracheal expectoration of secretions.  Patient appeared comfortable and vitals WFL.  Patient required Max encouragement for initiation of and intensity during voicing.     FIM:  Comprehension Comprehension Mode: Auditory Comprehension: 5-Understands basic 90% of the time/requires cueing < 10% of the time Expression Expression Mode: Verbal Expression Assistive Devices: 6-Talk trach valve Expression: 2-Expresses basic 25 - 49% of the time/requires cueing 50 - 75% of the time. Uses single words/gestures. Social Interaction Social Interaction: 5-Interacts appropriately 90% of the time - Needs monitoring or encouragement for participation or interaction. Problem Solving Problem Solving: 4-Solves basic 75 - 89% of the time/requires cueing 10 - 24% of the time Memory Memory: 5-Recognizes or recalls 90% of the time/requires cueing < 10% of the time  Pain Pain Assessment Pain Assessment: No/denies pain  Therapy/Group: Individual Therapy  Charlane Ferretti., CCC-SLP [phone]  [name] 08/18/2013, 4:47 PM

## 2013-08-18 NOTE — Progress Notes (Signed)
Occupational Therapy Session Note  Patient Details  Name: Shaunte Erdman MRN: [id number] Date of Birth: 01-21-1961  Today's Date: 08/18/2013 Time: 5462-7035 and 1330-1400 Time Calculation (min): 60 min and 30 min  Short Term Goals: Week 1:  OT Short Term Goal 1 (Week 1): Pt will complete bathing with mod assist  OT Short Term Goal 2 (Week 1): Pt will complete UB dressing with mod assist OT Short Term Goal 3 (Week 1): Pt will complete LB dressing with max assist OT Short Term Goal 4 (Week 1): Pt will utilize LUE as stabilizer to assist with changing LVAD hookup from wall power to battery OT Short Term Goal 5 (Week 1): Pt will complete toilet transfer with mod assist  Skilled Therapeutic Interventions/Progress Updates:    1) Engaged in ADL retraining with focus on sitting balance, standing balance, and functional use of LUE with bathing and dressing. Engaged in bathing and dressing tasks while seated EOB.  Pt able to utilize LUE with washing Rt arm this session, requiring assist at elbow to fully reach Rt underarm and wash shoulder.  Washed BLE with crossing leg over opposite knee, requiring min assist to stabilize LLE due to stiffness and decreased coordination.  Pt donned Rt pant leg and sock this session with increased time. Sit > stand with steady assist to pull up pants. Pt requested to return to bed at end of session due to long break until next session.  Pt able to grasp deodorant in Lt hand to remove cap and apply to Rt underarm.    2) Focus of session on functional use of LUE with sustained grasp to unscrew LVAD from wall source and hook into battery packs.  Hand over hand to facilitate increased grasp to maintain grasp on battery pack while screwing in power source.  Pt reports slight pain in Lt shoulder with AAROM.  Pt able to lock and unlock Lt brake with Lt hand, encouraged pt to utilize LUE during any functional task to continue to increase functional use.  Therapy  Documentation Precautions:  Precautions Precautions: Fall Precaution Comments: LVAD, PICC line, NG tube, trach on supplemental 02; Temp greater than or equal to 100.0 degrees fahrenheit PO. Doppler MAP less than 60 or greater than 90; Heart Rate less than 50 or greater than 120; Urine Output less than 240cc in 8 hours or less than 30 cc/hr; O2 saturation less than 90%; CVP greater than 18 or less than 6.; NO CHEST COMPRESSIONS Restrictions Weight Bearing Restrictions: No General:   Vital Signs: Therapy Vitals Pulse Rate: 84 Resp: 17 Oxygen Therapy SpO2: 99 % O2 Device: Trach collar O2 Flow Rate (L/min): 5 L/min FiO2 (%): 28 % Pain:  Pt with no c/o pain  See FIM for current functional status  Therapy/Group: Individual Therapy  Rosalio Loud 08/18/2013, 12:19 PM

## 2013-08-18 NOTE — Procedures (Signed)
Objective Swallowing Evaluation: Fiberoptic Endoscopic Evaluation of Swallowing  Patient Details  Name: Tiberius Romualdo MRN: [id number] Date of Birth: 1960-07-05  Today's Date: 08/18/2013 Time: 4383-8184 Time Calculation (min): 15 min  Past Medical History:  Past Medical History  Diagnosis Date  . CHF (congestive heart failure)     EF- 10-15  . Medically noncompliant   . Mitral regurgitation   . Tobacco user   . HTN (hypertension)   . AICD (automatic cardioverter/defibrillator) present   . GERD (gastroesophageal reflux disease)   . Substance abuse   . Chronic renal insufficiency   . Syncope   . Thrombus 08/06/2010  . SYSTOLIC HEART FAILURE, CHRONIC 09/22/2008    Qualifier: Diagnosis of  By: Gala Romney, MD, Trixie Dredge   . LV (left ventricular) mural thrombus 01/28/2011  . ICD - IN SITU 09/16/2008    Qualifier: Diagnosis of  By: Wonda Amis    . MITRAL STENOSIS/ INSUFFICIENCY, NON-RHEUMATIC 09/22/2008    Qualifier: Diagnosis of  By: Gala Romney, MD, Trixie Dredge Hepatomegaly 09/16/2008    Qualifier: Diagnosis of  By: Wonda Amis    . High cholesterol 02/26/2012    "at one time"  . Sleep apnea   . Exertional dyspnea 02/26/2012  . History of blood transfusion 08/2011    "when I had heart pump"  . Migraines   . COMMON MIGRAINE 06/14/2009    Qualifier: Diagnosis of  By: Jonny Ruiz MD, Len Blalock   . History of gout 02/26/2012  . Depression 08/11/2013    Pt denies  . Bipolar affective disorder 10/22/2011    pt denies this hx 02/26/2012   Past Surgical History:  Past Surgical History  Procedure Laterality Date  . Cardiac defibrillator placement  ~ 2008  . Left ventricular assist device  08/2011  . Radiology with anesthesia N/A 07/19/2013    Procedure: RADIOLOGY WITH ANESTHESIA;  Surgeon: Oneal Grout, MD;  Location: [facility] OR;  Service: Radiology;  Laterality: N/A;  . 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 He is s/p LVAD placement at [facility] March 2013, as bridge to transplant. Admitted for evaluation of new NSTEMI felt possibly to be related to pump thrombosis. Hosp course complicated by episodes of CP and hypotension on 1/24 pm hours. 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  Initial intubation 1/26-1/30; bedside swallow eval on 1/31 revealed severe motor based oropharyngeal dysphagia with cranial nerve involvement significantly limiting oral strength and ROM; recs were for NPO.   Pt re-intubated 2/1-2/4; trach 2/4. Weaned from vent 2/5.  D/Cd to [facility] 08/11/13 and has been followed for communication, PMSV, and swallowing.  Currently with #6 cuffless Shiley.      Recommendation/Prognosis  Clinical Impression:   Dysphagia Diagnosis: Moderate pharyngeal phase dysphagia  Clinical impression: Pt presents with a moderate pharyngeal dysphagia marked by delayed swallow initiation, reduced airway closure with trace aspiration of all tested consistencies.  There were copious amounts of thin, frothy secretions standing in the larynx and also noted to move from the oral cavity into the pharynx.  These secretions were intermittently aspirated throughout the study, either between delivered food boluses or mixing with POs.   A cued cough helped to eject material from larynx; otherwise, no spontaneous cough was observed when penetrate reached the vocal cords.  Pt has excellent pharyngeal contraction -  no residue noted post-swallow.  Notably, presence of PMSV during swallows made negligible difference in function.  Recommend continuing trials of ice chips after oral care with PMV in place to facilitate cued cough for laryngeal clearance.     Swallow Evaluation Recommendations:  Diet Recommendations: NPO;Alternative means - temporary (ice chips with PMSV during SLP sessions) Medication Administration: Via alternative means Oral Care Recommendations: Oral care Q4 per protocol     Prognosis:  Prognosis for Safe Diet Advancement: Guarded   Individuals Consulted: Consulted and Agree with Results and Recommendations: Patient;Family member/caregiver;RN                    General: Type of Study: Fiberoptic Endoscopic Evaluation of Swallowing Reason for Referral: Objectively evaluate swallowing function Previous Swallow Assessment: 08/12/13 bedside swallow evaluation Diet Prior to this Study: NPO;Panda Temperature Spikes Noted: No Respiratory Status: Trach collar Trach Size and Type: #6;Uncuffed History of Recent Intubation: Yes Behavior/Cognition: Alert;Cooperative;Pleasant mood Oral Cavity - Dentition: Adequate natural dentition Oral Motor / Sensory Function: Within functional limits Patient Positioning: Upright in chair Baseline Vocal Quality: Breathy Volitional Cough: Weak Volitional Swallow: Able to elicit Anatomy: Other (Comment) (mild edema pyriforms, ventricular space; presence of NG imposing on epiglottis laterally) Pharyngeal Secretions: Standing secretions in larynx   Reason for Referral:   Objectively evaluate swallowing function    Oral Phase: Oral Preparation/Oral Phase Oral Phase: WFL   Pharyngeal Phase:  Pharyngeal Phase Pharyngeal Phase: Impaired Pharyngeal - Thin Pharyngeal - Ice Chips: Delayed swallow initiation;Reduced airway/laryngeal closure;Penetration/Aspiration after swallow;Penetration/Aspiration during swallow;Trace aspiration Penetration/Aspiration details (ice chips): Material enters airway, passes BELOW cords without attempt by patient to eject out (silent aspiration) Pharyngeal - Solids Pharyngeal - Puree: Delayed swallow initiation;Reduced airway/laryngeal closure;Penetration/Aspiration after swallow;Trace aspiration Penetration/Aspiration details (puree): Material enters airway, passes BELOW cords without attempt by patient to eject out (silent aspiration)   Cervical Esophageal Phase         Blenda MountsCouture, Omarion Minnehan  Laurice 08/18/2013, 5:11 PM

## 2013-08-19 ENCOUNTER — Inpatient Hospital Stay (HOSPITAL_COMMUNITY): Payer: Medicare Other

## 2013-08-19 ENCOUNTER — Inpatient Hospital Stay (HOSPITAL_COMMUNITY): Payer: Medicare Other | Admitting: Speech Pathology

## 2013-08-19 ENCOUNTER — Inpatient Hospital Stay (HOSPITAL_COMMUNITY): Payer: Medicare Other | Admitting: Occupational Therapy

## 2013-08-19 LAB — CBC
HCT: 27.2 % — ABNORMAL LOW (ref 39.0–52.0)
HEMOGLOBIN: 8.8 g/dL — AB (ref 13.0–17.0)
MCH: 28.4 pg (ref 26.0–34.0)
MCHC: 32.4 g/dL (ref 30.0–36.0)
MCV: 87.7 fL (ref 78.0–100.0)
PLATELETS: 320 10*3/uL (ref 150–400)
RBC: 3.1 MIL/uL — ABNORMAL LOW (ref 4.22–5.81)
RDW: 15.7 % — ABNORMAL HIGH (ref 11.5–15.5)
WBC: 4.6 10*3/uL (ref 4.0–10.5)

## 2013-08-19 LAB — BLOOD GAS, ARTERIAL
Acid-Base Excess: 3.2 mmol/L — ABNORMAL HIGH (ref 0.0–2.0)
Bicarbonate: 26.6 mEq/L — ABNORMAL HIGH (ref 20.0–24.0)
Drawn by: 24486
FIO2: 0.28 %
O2 Content: 5 L/min
O2 Saturation: 98.6 %
Patient temperature: 98.6
TCO2: 27.7 mmol/L (ref 0–100)
pCO2 arterial: 36.1 mmHg (ref 35.0–45.0)
pH, Arterial: 7.48 — ABNORMAL HIGH (ref 7.350–7.450)
pO2, Arterial: 95.8 mmHg (ref 80.0–100.0)

## 2013-08-19 LAB — GLUCOSE, CAPILLARY
Glucose-Capillary: 117 mg/dL — ABNORMAL HIGH (ref 70–99)
Glucose-Capillary: 107 mg/dL — ABNORMAL HIGH (ref 70–99)
Glucose-Capillary: 103 mg/dL — ABNORMAL HIGH (ref 70–99)

## 2013-08-19 LAB — HEPARIN LEVEL (UNFRACTIONATED): HEPARIN UNFRACTIONATED: 0.46 [IU]/mL (ref 0.30–0.70)

## 2013-08-19 LAB — SURGICAL PCR SCREEN
MRSA, PCR: NEGATIVE
Staphylococcus aureus: NEGATIVE

## 2013-08-19 LAB — PROTIME-INR
INR: 2.44 — AB (ref 0.00–1.49)
PROTHROMBIN TIME: 25.7 s — AB (ref 11.6–15.2)

## 2013-08-19 MED ORDER — WARFARIN SODIUM 5 MG PO TABS
5.0000 mg | ORAL_TABLET | Freq: Once | ORAL | Status: AC
  Administered 2013-08-19: 5 mg via ORAL
  Filled 2013-08-19: qty 1

## 2013-08-19 NOTE — Progress Notes (Signed)
ANTICOAGULATION CONSULT NOTE  Pharmacy Consult for heparin, warfarin Indication: LVAD thrombosis  Allergies  Allergen Reactions  . Ace Inhibitors Cough  . Lexapro [Escitalopram Oxalate] Other (See Comments)    somnolence    Patient Measurements: Weight: 181 lb 6.4 oz (82.283 kg) (RN notified )  Vital Signs: Temp: 98 F (36.7 C) (02/26 0456) Temp src: Oral (02/26 0456) Pulse Rate: 88 (02/26 1230)  Labs:  Recent Labs  08/17/13 0612 08/18/13 0500 08/19/13 0710  HGB 9.4* 9.0* 8.8*  HCT 29.8* 28.8* 27.2*  PLT 374 360 320  LABPROT 26.1* 27.9* 25.7*  INR 2.49* 2.72* 2.44*  HEPARINUNFRC 0.50 0.48 0.46  CREATININE  --  0.95  --     The CrCl is unknown because both a height and weight (above a minimum accepted value) are required for this calculation.  Assessment: 53 yo M with LVAD admitted 07/17/2013 with elevated trop/NSTEMI.  Pharmacy consulted to dose heparin for possible pump thrombosis  Events: INR stable, therapeutic, heparin level at goal.  No bleeding noted.  PMH: CHF, mitral regurg, tobacco, HTN, AICD, GERD, CKD, mitral stenosis, hepatomegaly, HLD, OSA, gout, bipolar, depression, LVAD in 2012  Coag: LVAD, ACS, pump thrombosis - heparin started on admit> Heparin held for bleeding from mouth after intubation/nose improved -heparin stopped 2/16 after INR > 2 x48hr - surgery feels pt too high risk for PEG - panda clotted - exchanged 2/19 > heparin restarted 2/22.  LDH remains elevated.  PTA Warfarin: 5 qd 6 Fri but took 10mg  for 2-3 days pta for INR 1.2 week pta (took extra Coum Thu/Fri pta due to low INR) CV: MAP 100s -RV failure. LDH up 1000s - discussion with [facility] about pump exchange in near future- ASA81 and plavix, add spiro, continue lasix 40 po bid   Goal of Therapy:  INR 2-3 Monitor platelets by anticoagulation protocol: Yes Heparin level = 0.3-0.5  Plan:  Warfarin 5 mg po x 1. Continue Heparin at current rate Continue daily heparin level and  CBC.  Tad Moore, BCPS  Clinical Pharmacist Pager [phone]  08/19/2013 1:18 PM '

## 2013-08-19 NOTE — Progress Notes (Signed)
Physical Therapy Note  Patient Details  Name: [name] MRN: [id number] Date of Birth: 02-12-1961 Today's Date: 08/19/2013  11:15 - 12:00 45 minutes Individual session. Patient denies pain.  Patient sitting in wheelchair upon entering room. Patient changed to batteries for LVAD. Patient also changed to portable O2 trach collar. Patient pushed in wheelchair to gym. Patient sit to stand with min assist and ambulated 230 feet with RW and +1 min steady assist for balance and +1 assist for IV and O2. Patient ambulated 230 feet x 2 with RW and close supervision of 1 and +1 for IV and O2 on tile and carpet to simulate home setting. Patient worked on dynamic standing balance with min assist for balance while kicking ball with alternating feet and volleying ball with right UE x 4 minutes. Pulse ox monitored intermittently throughout session with sats staying 97 to 100%. Patient changed to wall O2 and left sitting in wheelchair with all items in reach.  Arelia Longest M 08/19/2013, 1:30 PM

## 2013-08-19 NOTE — Progress Notes (Signed)
Occupational Therapy Weekly Progress Note  Patient Details  Name: [name] MRN: [id number] Date of Birth: [birth year]  Today's Date: 08/19/2013 Time: 5597-4163 and 1345-1430 Time Calculation (min): 57 min and 45 min  Patient has met 4 of 5 short term goals.  Pt is making steady progress towards goals.  He is mod assist with transfers and min/steady assist with static standing.  Continues to require physical assistance with mobility and self-care tasks mostly due to multiple leads, lines, and tubes.  Pt is developing increased LUE function with full elbow ROM, gross grasp, and shoulder flexion to approx 75 degrees.    Patient continues to demonstrate the following deficits: Lt hemiparesis with impaired motor control, timing, sequencing, impaired activity tolerance and cardiovascular endurance, impaired postural control, balance and therefore will continue to benefit from skilled OT intervention to enhance overall performance with BADL and Reduce care partner burden.  Patient progressing toward long term goals..  Continue plan of care. Discontinued simple meal prep goal due to pt now scheduled to d/c to [facility] vs. [facility] acute care for LVAD replacement secondary to clot in LVAD.  OT Short Term Goals Week 1:  OT Short Term Goal 1 (Week 1): Pt will complete bathing with mod assist  OT Short Term Goal 1 - Progress (Week 1): Met OT Short Term Goal 2 (Week 1): Pt will complete UB dressing with mod assist OT Short Term Goal 2 - Progress (Week 1): Discontinued (comment) (unable to complete UB dressing due to continues IV) OT Short Term Goal 3 (Week 1): Pt will complete LB dressing with max assist OT Short Term Goal 3 - Progress (Week 1): Met OT Short Term Goal 4 (Week 1): Pt will utilize LUE as stabilizer to assist with changing LVAD hookup from wall power to battery OT Short Term Goal 4 - Progress (Week 1): Met OT Short Term Goal 5 (Week 1): Pt will complete toilet transfer with mod assist OT Short  Term Goal 5 - Progress (Week 1): Met Week 2:  OT Short Term Goal 1 (Week 2): Pt will complete bathing with min assist OT Short Term Goal 2 (Week 2): Pt will complete LB dressing with min assist OT Short Term Goal 3 (Week 2): Pt will complete toilet transfer with min assist OT Short Term Goal 4 (Week 2): Pt will utilize LUE as gross assist to change LVAD hookup from wall power to battery  Skilled Therapeutic Interventions/Progress Updates:    1) Engaged in ADL retraining with focus on sitting balance, standing balance, and functional use of LUE with bathing and dressing. Engaged in bathing and dressing tasks while seated EOB. Pt able to utilize LUE with washing Rt arm this session, requiring assist at elbow to fully reach Rt underarm and wash shoulder. Washed buttocks and perineal area in standing with min/steady assist and assist for thoroughness. Pt donned Rt pant leg and required backward chaining with Lt pant leg. Sit > stand with steady assist to pull up pants. Pt completed stand pivot to w/c with min assist for stability and to manage lines.  Pt left in w/c still connected to wall power (LVAD) while RN attending to pt and x-ray tech present.  Utilized PMSV intermittently during session as pt reports discomfort due to increase in secretions early in AM with initial mobility.  2) Engaged in NM re-ed and functional transfers.  Upon arrival pt reports need to toilet.  Performed stand pivot transfer from w/c > toilet with min assist with 2nd person  available to manage leads and tubes.  Pt continent of bowel, performing hygiene with lateral lean and was able to pull pants over hips this session but required assist with donning brief.  Completed LUE NM re-ed in sitting with focus on increased use, shoulder flexion, horizontal adduction, and grasp/release when reaching across midline for placement of pegs.  Pt unable to pick pegs up from horizontal on table but could when placed vertically.  Therapy  Documentation Precautions:  Precautions Precautions: Fall Precaution Comments: LVAD, PICC line, NG tube, trach on supplemental 02; Temp greater than or equal to 100.0 degrees fahrenheit PO. Doppler MAP less than 60 or greater than 90; Heart Rate less than 50 or greater than 120; Urine Output less than 240cc in 8 hours or less than 30 cc/hr; O2 saturation less than 90%; CVP greater than 18 or less than 6.; NO CHEST COMPRESSIONS Restrictions Weight Bearing Restrictions: No Pain:  Pt with no c/o pain  See FIM for current functional status  Therapy/Group: Individual Therapy  Baily Hovanec, [place] 08/19/2013, 9:30 AM

## 2013-08-19 NOTE — Progress Notes (Signed)
Subjective/Complaints: 53 y.o. 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 He is s/p LVAD placement at [facility] March 2013, as bridge to transplant. He was admitted for evaluation of new NSTEMI felt possibly to be related to pump thrombosis. Hosp course complicated by episodes of CP and hypotension on 1/24 pm hours requiring pressors. He . 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 Aspiration PNA treated with zosyn. TEE 1/28 without evidence of clot but with RV failure  Extubated on 07/23/13 but has difficulty handling secretions and reintubated on 07/25/13. He developed oropharyngeal bleeding and ENT evaluations did not reveal source of acute bleeding. Trach placed by Dr. Harland Dingwall on 07/28/13 and patient tolerating trach collar. He is NPO due to sever dysphagia but deemed too high risk for PEG placement by IR and CCS. Is tolerating panda tube feeds without difficulty. Trach downsized to CFS # 6 yesterday and PMSV trials attempted. Patient able to cough tracheally.  He developed hematuria with urinary retention and foley placed on 08/07/13. Has had complaints of bladder pain and foley d/c today. 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 Therapies ongoing and patient showing improvement in left hemiparesis. He continues with ideomotor/ oromotor apraxia, severe oropharyngeal dysphagia as well as aphasia   Objective: Awake, comfortable sitting in chair Discussed possible pump exchange Need for cont trach for now  Denies SOB Coughing up moderate clear secretions Appreciate cardiology note Review of Systems - cannot obtain secondary to reduced speech   Vital Signs: Pulse 93, temperature 98 F (36.7 C), temperature source Oral, resp. rate 18, weight 82.283 kg (181 lb 6.4 oz), SpO2 100.00%. Dg Chest Port 1 View  08/19/2013    CLINICAL DATA:  Stroke, cough, recent tracheostomy  EXAM: PORTABLE CHEST - 1 VIEW  COMPARISON:  08/04/2013  FINDINGS: Stable tracheostomy tube position. Feeding tube is unchanged in position. One lead cardiac pacemaker again noted. Stable left ventricular assist device position. No pulmonary edema. Again noted left basilar atelectasis or infiltrate. Right PICC line is unchanged in position. Right lung is clear.  IMPRESSION: Stable support apparatus. No pulmonary edema. Again noted left basilar atelectasis or infiltrate.   Electronically Signed   By: Lahoma Crocker M.D.   On: 08/19/2013 09:36   Dg Abd Portable 1v  08/17/2013   CLINICAL DATA:  Abdominal distention and discomfort.  EXAM: PORTABLE ABDOMEN - 1 VIEW  COMPARISON:  07/30/2013 and prior studies  FINDINGS: The bowel gas pattern is normal.  No dilated bowel loops are present.  The small bore feeding tube is present with tip overlying the proximal duodenum.  Left ventricular assist device again noted.  IMPRESSION: Unremarkable bowel gas pattern.  No acute abnormalities.   Electronically Signed   By: Hassan Rowan M.D.   On: 08/17/2013 23:52   Results for orders placed during the hospital encounter of 08/11/13 (from the past 72 hour(s))  GLUCOSE, CAPILLARY     Status: None   Collection Time    08/16/13  3:30 PM      Result Value Ref Range   Glucose-Capillary 98  70 - 99 mg/dL  HEPARIN LEVEL (UNFRACTIONATED)     Status: None   Collection Time    08/16/13  4:30 PM      Result Value Ref Range   Heparin Unfractionated 0.61  0.30 - 0.70 IU/mL   Comment:  IF HEPARIN RESULTS ARE BELOW     EXPECTED VALUES, AND PATIENT     DOSAGE HAS BEEN CONFIRMED,     SUGGEST FOLLOW UP TESTING     OF ANTITHROMBIN III LEVELS.  GLUCOSE, CAPILLARY     Status: None   Collection Time    08/16/13 11:15 PM      Result Value Ref Range   Glucose-Capillary 98  70 - 99 mg/dL  PROTIME-INR     Status: Abnormal   Collection Time    08/17/13  6:12 AM      Result Value  Ref Range   Prothrombin Time 26.1 (*) 11.6 - 15.2 seconds   INR 2.49 (*) 0.00 - 1.49  CBC     Status: Abnormal   Collection Time    08/17/13  6:12 AM      Result Value Ref Range   WBC 5.8  4.0 - 10.5 K/uL   RBC 3.38 (*) 4.22 - 5.81 MIL/uL   Hemoglobin 9.4 (*) 13.0 - 17.0 g/dL   HCT 29.8 (*) 39.0 - 52.0 %   MCV 88.2  78.0 - 100.0 fL   MCH 27.8  26.0 - 34.0 pg   MCHC 31.5  30.0 - 36.0 g/dL   RDW 15.5  11.5 - 15.5 %   Platelets 374  150 - 400 K/uL  HEPARIN LEVEL (UNFRACTIONATED)     Status: None   Collection Time    08/17/13  6:12 AM      Result Value Ref Range   Heparin Unfractionated 0.50  0.30 - 0.70 IU/mL   Comment:            IF HEPARIN RESULTS ARE BELOW     EXPECTED VALUES, AND PATIENT     DOSAGE HAS BEEN CONFIRMED,     SUGGEST FOLLOW UP TESTING     OF ANTITHROMBIN III LEVELS.  GLUCOSE, CAPILLARY     Status: Abnormal   Collection Time    08/17/13  9:17 PM      Result Value Ref Range   Glucose-Capillary 109 (*) 70 - 99 mg/dL  BASIC METABOLIC PANEL     Status: Abnormal   Collection Time    08/18/13  5:00 AM      Result Value Ref Range   Sodium 138  137 - 147 mEq/L   Potassium 4.0  3.7 - 5.3 mEq/L   Chloride 97  96 - 112 mEq/L   CO2 29  19 - 32 mEq/L   Glucose, Bld 115 (*) 70 - 99 mg/dL   BUN 21  6 - 23 mg/dL   Creatinine, Ser 0.95  0.50 - 1.35 mg/dL   Calcium 9.0  8.4 - 10.5 mg/dL   GFR calc non Af Amer >90  >90 mL/min   GFR calc Af Amer >90  >90 mL/min   Comment: (NOTE)     The eGFR has been calculated using the CKD EPI equation.     This calculation has not been validated in all clinical situations.     eGFR's persistently <90 mL/min signify possible Chronic Kidney     Disease.  LACTATE DEHYDROGENASE     Status: Abnormal   Collection Time    08/18/13  5:00 AM      Result Value Ref Range   LDH 1234 (*) 94 - 250 U/L  PROTIME-INR     Status: Abnormal   Collection Time    08/18/13  5:00 AM      Result Value Ref Range  Prothrombin Time 27.9 (*) 11.6 - 15.2  seconds   INR 2.72 (*) 0.00 - 1.49  CBC     Status: Abnormal   Collection Time    08/18/13  5:00 AM      Result Value Ref Range   WBC 4.8  4.0 - 10.5 K/uL   RBC 3.26 (*) 4.22 - 5.81 MIL/uL   Hemoglobin 9.0 (*) 13.0 - 17.0 g/dL   HCT 28.8 (*) 39.0 - 52.0 %   MCV 88.3  78.0 - 100.0 fL   MCH 27.6  26.0 - 34.0 pg   MCHC 31.3  30.0 - 36.0 g/dL   RDW 15.5  11.5 - 15.5 %   Platelets 360  150 - 400 K/uL  HEPARIN LEVEL (UNFRACTIONATED)     Status: None   Collection Time    08/18/13  5:00 AM      Result Value Ref Range   Heparin Unfractionated 0.48  0.30 - 0.70 IU/mL   Comment:            IF HEPARIN RESULTS ARE BELOW     EXPECTED VALUES, AND PATIENT     DOSAGE HAS BEEN CONFIRMED,     SUGGEST FOLLOW UP TESTING     OF ANTITHROMBIN III LEVELS.  GLUCOSE, CAPILLARY     Status: Abnormal   Collection Time    08/18/13  5:41 AM      Result Value Ref Range   Glucose-Capillary 107 (*) 70 - 99 mg/dL  GLUCOSE, CAPILLARY     Status: Abnormal   Collection Time    08/18/13  3:28 PM      Result Value Ref Range   Glucose-Capillary 111 (*) 70 - 99 mg/dL  GLUCOSE, CAPILLARY     Status: Abnormal   Collection Time    08/18/13  9:26 PM      Result Value Ref Range   Glucose-Capillary 105 (*) 70 - 99 mg/dL  GLUCOSE, CAPILLARY     Status: Abnormal   Collection Time    08/19/13  5:07 AM      Result Value Ref Range   Glucose-Capillary 117 (*) 70 - 99 mg/dL  PROTIME-INR     Status: Abnormal   Collection Time    08/19/13  7:10 AM      Result Value Ref Range   Prothrombin Time 25.7 (*) 11.6 - 15.2 seconds   INR 2.44 (*) 0.00 - 1.49  CBC     Status: Abnormal   Collection Time    08/19/13  7:10 AM      Result Value Ref Range   WBC 4.6  4.0 - 10.5 K/uL   RBC 3.10 (*) 4.22 - 5.81 MIL/uL   Hemoglobin 8.8 (*) 13.0 - 17.0 g/dL   HCT 27.2 (*) 39.0 - 52.0 %   MCV 87.7  78.0 - 100.0 fL   MCH 28.4  26.0 - 34.0 pg   MCHC 32.4  30.0 - 36.0 g/dL   RDW 15.7 (*) 11.5 - 15.5 %   Platelets 320  150 - 400 K/uL   HEPARIN LEVEL (UNFRACTIONATED)     Status: None   Collection Time    08/19/13  7:10 AM      Result Value Ref Range   Heparin Unfractionated 0.46  0.30 - 0.70 IU/mL   Comment:            IF HEPARIN RESULTS ARE BELOW     EXPECTED VALUES, AND PATIENT     DOSAGE HAS BEEN CONFIRMED,  SUGGEST FOLLOW UP TESTING     OF ANTITHROMBIN III LEVELS.     HEENT: thrush Cardio: LVAD hum, no S1 or S2 Resp: normal breath sounds GI: BS positive, Distention and NT Extremity:  No Edema Skin:   Intact and Other noirritation around panda or trach Neuro: Lethargic, Cranial Nerve II-XII normal, Abnormal Sensory difficult to assess secondary to aphasia and Aphasic, Motor 4/5 RUE and RLE, 3- Left UE and 3- LLE Musc/Skel:  Normal GEN NAD   Assessment/Plan: 1. Functional deficits secondary to Bilateral BG embolic infarct with Left HP, Aphasia vs dysarthria which require 3+ hours per day of interdisciplinary therapy in a comprehensive inpatient rehab setting. Physiatrist is providing close team supervision and 24 hour management of active medical problems listed below. Physiatrist and rehab team continue to assess barriers to discharge/monitor patient progress toward functional and medical goals. FIM: FIM - Bathing Bathing Steps Patient Completed: Chest;Right Arm;Left Arm;Abdomen;Front perineal area;Right upper leg;Left upper leg Bathing: 3: Mod-Patient completes 5-7 66f 10 parts or 50-74%  FIM - Upper Body Dressing/Undressing Upper body dressing/undressing steps patient completed: Thread/unthread right sleeve of pullover shirt/dresss;Put head through opening of pull over shirt/dress Upper body dressing/undressing: 0: Wears gown/pajamas-no public clothing FIM - Lower Body Dressing/Undressing Lower body dressing/undressing steps patient completed: Thread/unthread right pants leg;Pull pants up/down Lower body dressing/undressing: 2: Max-Patient completed 25-49% of tasks  FIM - Toileting Toileting: 1:  Total-Patient completed zero steps, helper did all 3  FIM - Radio producer Devices: Recruitment consultant Transfers: 4-To toilet/BSC: Min A (steadying Pt. > 75%);4-From toilet/BSC: Min A (steadying Pt. > 75%)  FIM - Bed/Chair Transfer Bed/Chair Transfer Assistive Devices: Arm rests;HOB elevated Bed/Chair Transfer: 5: Supine > Sit: Supervision (verbal cues/safety issues);3: Bed > Chair or W/C: Mod A (lift or lower assist)  FIM - Locomotion: Wheelchair Distance: 150 Locomotion: Wheelchair: 1: Total Assistance/staff pushes wheelchair (Pt<25%) FIM - Locomotion: Ambulation Locomotion: Ambulation Assistive Devices: Walker - Rolling;Orthosis Ambulation/Gait Assistance: 1: +2 Total assist Locomotion: Ambulation: 1: Two helpers  Comprehension Comprehension Mode: Auditory Comprehension: 5-Follows basic conversation/direction: With extra time/assistive device  Expression Expression Mode: Nonverbal Expression Assistive Devices: 6-Talk trach valve Expression: 2-Expresses basic 25 - 49% of the time/requires cueing 50 - 75% of the time. Uses single words/gestures.  Social Interaction Social Interaction: 6-Interacts appropriately with others with medication or extra time (anti-anxiety, antidepressant).  Problem Solving Problem Solving: 4-Solves basic 75 - 89% of the time/requires cueing 10 - 24% of the time  Memory Memory: 5-Recognizes or recalls 90% of the time/requires cueing < 10% of the time  Medical Problem List and Plan:  Embolic bilateral BG CVA's, left thalamus CVA, CHF  1. 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 Pain Management: Will monitor for signs of distress. Tylenol prn.  3. Mood: No signs of distress but question awareness/insight into deficits. Continue celexa. Will have LCSW follow along for evaluation/support as indicated.  4. Neuropsych: This patient is capable  of making decisions on her own behalf.  5. LVAD: Cardiology to follow for management/support.   -weights, I's and O's being followed closely 6. Urinary retention/Hematuria:  Started on flomax. PVR/cath prn.  7. Severe dysphagia/oral apraxia: Continue panda tube.   50 cc/hr with fluid bolus.  8. Trach dependent: Tolerating CFS #6. Continue to suction as needed pulmonary toilet- not ready for #4 per CCM -trials of PMV with SLP, since pump exchange is planned would hold off on downsize  9.  Anemia  with hemolysis ? r/t pump, monitor, elevated LDH  LOS (Days) 8 A FACE TO FACE EVALUATION WAS PERFORMED  Aishah Teffeteller E 08/19/2013, 9:48 AM

## 2013-08-19 NOTE — Progress Notes (Signed)
Speech Language Pathology Weekly Progress and Session Note  Patient Details  Name: [name] MRN: [id number] Date of Birth: June 06, 1961  Today's Date: 08/19/2013 Time: 1000-1045 Time Calculation (min): 45 min  Short Term Goals: Week 1: SLP Short Term Goal 1 (Week 1): Patient will tolerate PMSV for 1 hour with clinician supervision  SLP Short Term Goal 1 - Progress (Week 1): Progressing toward goal SLP Short Term Goal 2 (Week 1): Patient will self-monitor and manage secreations with Mod clinician cues. SLP Short Term Goal 2 - Progress (Week 1): Met SLP Short Term Goal 3 (Week 1): Patient will utilize speech intelligibility compensatory strategies with Mod clinician cues. SLP Short Term Goal 3 - Progress (Week 1): Progressing toward goal SLP Short Term Goal 4 (Week 1): Patient will consume trials of ice chips with minimal overt s/s of aspiration with 50% of trials and Mod clinician cues. SLP Short Term Goal 4 - Progress (Week 1): Met    New Short Term Goals: Week 2: SLP Short Term Goal 1 (Week 2): Patient will tolerate PMSV for 15-20 minutes with clinician supervision  SLP Short Term Goal 2 (Week 2): Patient will self-monitor and manage secreations with Min clinician cues. SLP Short Term Goal 3 (Week 2): Patient will utilize speech intelligibility compensatory strategies with Mod clinician cues. SLP Short Term Goal 4 (Week 2): Patient will consume trials of ice chips with minimal overt s/s of aspiration with 50% of trials and Min clinician cues.  Weekly Progress Updates: Patient met 2 out of 4 short term objectives this reporting period due to gains in ability to manage secretions and ability to utilize safe swallow strategies during ice chip trials with Mod clinician cues.  Patient has made progress with toleration of PMSV; however, SLP cannot truly determine toleration for a 1 hour period of time due to the frequency of coughs that remove PMSV from trach hub.  Additionally, patient has  made gains with speech intelligibility however he continues to require Max encouragement to initiate verbal communication.  Patient continues to require skilled SLP services to address PMSV toleration, verbal expression of wants and needs as well as dysphagia goals prior to discharge.     Intensity: Minumum of 1-2 x/day, 30 to 90 minutes Frequency: 5 out of 7 days Duration/Length of Stay: 1-2 weeks per MD Treatment/Interventions: Cueing hierarchy;Dysphagia/aspiration precaution training;Functional tasks;Patient/family education;Speech/Language facilitation   Daily Session  Skilled Therapeutic Interventions: Skilled treatment session focused on addressing PMSV toleration, speech production and dysphagia goals. SLP facilitated session with Mod encouragement to cough to expectorate secretions from tracheostomy prior to placement of PMSV due to audible congestion and patient declining deep suctioning.  SLP placed PMSV; PMSV in place for 5-10 minute increments due to patient's intermittent cough displacing valve from trach hub.  Patient did not demonstrate breath stacking at 5 -10 minute increments; this is the greatest length of time SLP could assess.  Patient's SpO2 readings fluctuated between 88-100 during session; SLP observed that reading would drop as audible wheezes increased, which would be followed by a strong cough and tracheal expectoration of secretions and in turn oxygen reading would return to high 90s-100.  Patient's pulse remained 94-97 throughout session and breathing remained easy.  Patient required Max encouragement for initiation of and intensity of voicing during automatic speech tasks.  SLP also facilitated session with set-up and Supervision level verbal cues to perform oral care via suctioning.  Patient consumed trials of ice chips with Mod verbal cues to utilize 2 swallows  per trial and intermittent throat clears.  Patient demonstrated intermittent s/s of aspiration with a delayed  strong reflexive cough.  Continue with current plan of care.    FIM:  Comprehension Comprehension Mode: Auditory Comprehension: 5-Understands basic 90% of the time/requires cueing < 10% of the time Expression Expression Mode: Nonverbal Expression Assistive Devices: 6-Talk trach valve Expression: 2-Expresses basic 25 - 49% of the time/requires cueing 50 - 75% of the time. Uses single words/gestures. Social Interaction Social Interaction: 5-Interacts appropriately 90% of the time - Needs monitoring or encouragement for participation or interaction. Problem Solving Problem Solving: 4-Solves basic 75 - 89% of the time/requires cueing 10 - 24% of the time Memory Memory: 4-Recognizes or recalls 75 - 89% of the time/requires cueing 10 - 24% of the time FIM - Eating Eating Activity: 5: Needs verbal cues/supervision;4: Help with managing cup/glass (with trials) General    Pain Pain Assessment Pain Assessment: No/denies pain  Therapy/Group: Individual Therapy  Carmelia Roller., [place] [phone]  [place] 08/19/2013, 2:05 PM

## 2013-08-19 NOTE — Progress Notes (Addendum)
Patient ID: [name], male   DOB: [birth year], 53 y.o.   MRN: [id number] Advanced Heart Failure Rounding Note  Working with rehab. Using PM valve. Failed swallow study again yesterday. He is frustrated. No HF symptoms.   Weights all over the place.    INR 2.4  LDH 4665>9935 > 7017>7939  VAD interrogated personally. Flow 4.7, Speed 9200 PI 6.1 Power 5.3  Alarms:  None.  occasional PI event  Filed Vitals:   08/19/13 0043 08/19/13 0456 08/19/13 0526 08/19/13 0933  Pulse: 93 89 87 93  Temp:  98 F (36.7 C)    TempSrc:  Oral    Resp: 18 18 18 18   Weight:  99 kg (218 lb 4.1 oz) 82.283 kg (181 lb 6.4 oz)   SpO2: 96% 97% 100% 100%    Intake/Output Summary (Last 24 hours) at 08/19/13 1115 Last data filed at 08/19/13 0500  Gross per 24 hour  Intake      0 ml  Output    400 ml  Net   -400 ml   Scheduled Meds: . allopurinol  100 mg Oral Daily  . antiseptic oral rinse  15 mL Mouth Rinse QID  . aspirin  81 mg Per NG tube Daily  . chlorhexidine  15 mL Mouth Rinse BID  . citalopram  10 mg Per Tube Daily  . clopidogrel  75 mg Per Tube Q breakfast  . feeding supplement (PRO-STAT SUGAR FREE 64)  30 mL Per Tube BID  . free water  150 mL Per Tube 4 times per day  . furosemide  40 mg Per Tube Daily  . glycopyrrolate  1 mg Per Tube QPC breakfast  . magnesium oxide  400 mg Per Tube Daily  . pantoprazole sodium  40 mg Per Tube Q1200  . sodium chloride  10-40 mL Intracatheter Q12H  . spironolactone  25 mg Per Tube Daily  . Warfarin - Pharmacist Dosing Inpatient   Does not apply q1800   Continuous Infusions: . sodium chloride 20 mL/hr at 08/19/13 0506  . feeding supplement (JEVITY 1.2 CAL) 1,000 mL (08/18/13 1900)  . heparin 1,200 Units/hr (08/18/13 1400)   PRN Meds:.acetaminophen, bisacodyl, guaiFENesin-dextromethorphan, lidocaine, nitroGLYCERIN, ondansetron (ZOFRAN) IV, sodium chloride  LABS: Basic Metabolic Panel:  Recent Labs  03/00/92 0500  NA 138  K 4.0  CL 97  CO2 29   GLUCOSE 115*  BUN 21  CREATININE 0.95  CALCIUM 9.0   Liver Function Tests: No results found for this basename: AST, ALT, ALKPHOS, BILITOT, PROT, ALBUMIN,  in the last 72 hours No results found for this basename: LIPASE, AMYLASE,  in the last 72 hours CBC:  Recent Labs  08/18/13 0500 08/19/13 0710  WBC 4.8 4.6  HGB 9.0* 8.8*  HCT 28.8* 27.2*  MCV 88.3 87.7  PLT 360 320   Cardiac Enzymes: No results found for this basename: CKTOTAL, CKMB, CKMBINDEX, TROPONINI,  in the last 72 hours RADIOLOGY: Dg Chest Portable 1 View  07/17/2013   CLINICAL DATA:  Left-sided chest pain for 3 days, weakness and dizziness.  EXAM: PORTABLE CHEST - 1 VIEW  COMPARISON:  08/03/2012  FINDINGS: Evidence of median sternotomy noted with LVAD in place. Left-sided defibrillator noted. Mild prominence of the cardiac silhouette persists. Tricuspid valvuloplasty reidentified. Trace left pleural fluid or thickening persists. No new pulmonary opacity.  IMPRESSION: No new acute abnormality. Stable appearance of trace left pleural fluid or thickening.   Electronically Signed   By: Christiana Pellant M.D.   On:  07/17/2013 10:12    PHYSICAL EXAM-  General: NAD; on trach collar; sitting in WC watching TV Neck: JVP 6-7; trach collar; clear secretions Lungs: coarse throughout CV: LVAD pump hum normal.   Abdomen: Nontender no hepatosplenomegaly, no distention. + BS Neurologic: awake interactive.  Moving left leg,and L arm Extremities: No clubbing or cyanosis.  RUE PICC. Has arm sleeve    Assessment:   1. Acute embolic CVA 2. 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  3. 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 NICM EF 15%  5. Anemia 6. Respiratory failure s/p trach    Plan/Discussion:    Doing very well with rehab. But failed repeat swallow study. Continue aggressive therapy. Continue current lasix dose.   INR therapeutic. Continue heparin. Hgb drifting down.  No evidence pump dysfunction  or power surges. Recheck LDH. Will likely need pump exchange ideally would like to have him do at least 1-2 more weeks of rehab and also give RV time to heal. May need transfusion soon. Limit blood draws.   VAD interrogated personally.    Arvilla Meresaniel Tiffanni Scarfo MD 11:15 AM  Advanced Heart Failure Team Pager [phone] (M-F; 7a - 4p)  Please contact [facility] for night-coverage after hours (4p -7a ) and weekends on [email or web address]

## 2013-08-20 ENCOUNTER — Inpatient Hospital Stay (HOSPITAL_COMMUNITY): Payer: Medicare Other | Admitting: Occupational Therapy

## 2013-08-20 ENCOUNTER — Inpatient Hospital Stay (HOSPITAL_COMMUNITY): Payer: Medicare Other | Admitting: Physical Therapy

## 2013-08-20 ENCOUNTER — Inpatient Hospital Stay (HOSPITAL_COMMUNITY): Payer: Medicare Other | Admitting: Speech Pathology

## 2013-08-20 DIAGNOSIS — I059 Rheumatic mitral valve disease, unspecified: Secondary | ICD-10-CM

## 2013-08-20 LAB — COMPREHENSIVE METABOLIC PANEL
ALT: 18 U/L (ref 0–53)
AST: 46 U/L — ABNORMAL HIGH (ref 0–37)
Albumin: 2.9 g/dL — ABNORMAL LOW (ref 3.5–5.2)
Alkaline Phosphatase: 85 U/L (ref 39–117)
BUN: 15 mg/dL (ref 6–23)
CO2: 29 mEq/L (ref 19–32)
Calcium: 9.3 mg/dL (ref 8.4–10.5)
Chloride: 99 mEq/L (ref 96–112)
Creatinine, Ser: 1.03 mg/dL (ref 0.50–1.35)
GFR calc Af Amer: >90 mL/min (ref >90)
GFR calc non Af Amer: 83 mL/min — ABNORMAL LOW (ref >90)
Glucose, Bld: 114 mg/dL — ABNORMAL HIGH (ref 70–99)
Potassium: 4.6 mEq/L (ref 3.7–5.3)
Sodium: 138 mEq/L (ref 137–147)
Total Bilirubin: 0.7 mg/dL (ref 0.3–1.2)
Total Protein: 7.4 g/dL (ref 6.0–8.3)

## 2013-08-20 LAB — CBC
HEMATOCRIT: 29.4 % — AB (ref 39.0–52.0)
HEMOGLOBIN: 9.2 g/dL — AB (ref 13.0–17.0)
MCH: 27.5 pg (ref 26.0–34.0)
MCHC: 31.3 g/dL (ref 30.0–36.0)
MCV: 88 fL (ref 78.0–100.0)
Platelets: 322 10*3/uL (ref 150–400)
RBC: 3.34 MIL/uL — ABNORMAL LOW (ref 4.22–5.81)
RDW: 15.7 % — AB (ref 11.5–15.5)
WBC: 5 10*3/uL (ref 4.0–10.5)

## 2013-08-20 LAB — GLUCOSE, CAPILLARY
GLUCOSE-CAPILLARY: 116 mg/dL — AB (ref 70–99)
Glucose-Capillary: 128 mg/dL — ABNORMAL HIGH (ref 70–99)
Glucose-Capillary: 109 mg/dL — ABNORMAL HIGH (ref 70–99)

## 2013-08-20 LAB — URINE CULTURE
Colony Count: NO GROWTH
Culture: NO GROWTH
Special Requests: NORMAL

## 2013-08-20 LAB — PROTIME-INR
INR: 4.09 — ABNORMAL HIGH (ref 0.00–1.49)
INR: 2.48 — AB (ref 0.00–1.49)
Prothrombin Time: 38.1 seconds — ABNORMAL HIGH (ref 11.6–15.2)
Prothrombin Time: 26 seconds — ABNORMAL HIGH (ref 11.6–15.2)

## 2013-08-20 LAB — HEMOGLOBIN AND HEMATOCRIT, BLOOD
HCT: 28.7 % — ABNORMAL LOW (ref 39.0–52.0)
Hemoglobin: 9 g/dL — ABNORMAL LOW (ref 13.0–17.0)

## 2013-08-20 LAB — LACTATE DEHYDROGENASE: LDH: 1144 U/L — ABNORMAL HIGH (ref 94–250)

## 2013-08-20 LAB — AMYLASE: Amylase: 58 U/L (ref 0–105)

## 2013-08-20 LAB — HEPARIN LEVEL (UNFRACTIONATED): Heparin Unfractionated: 0.48 IU/mL (ref 0.30–0.70)

## 2013-08-20 MED ORDER — WARFARIN SODIUM 5 MG PO TABS
5.0000 mg | ORAL_TABLET | Freq: Once | ORAL | Status: AC
  Administered 2013-08-20: 5 mg via ORAL
  Filled 2013-08-20: qty 1

## 2013-08-20 NOTE — Progress Notes (Signed)
ANTICOAGULATION CONSULT NOTE  Pharmacy Consult for heparin, warfarin Indication: LVAD thrombosis  Allergies  Allergen Reactions  . Ace Inhibitors Cough  . Lexapro [Escitalopram Oxalate] Other (See Comments)    somnolence    Patient Measurements: Weight: 181 lb 6.4 oz (82.283 kg) (RN notified )  Vital Signs: Temp: 98.6 F (37 C) (02/27 0529) Temp src: Oral (02/27 0529) Pulse Rate: 91 (02/27 0922)  Labs:  Recent Labs  08/18/13 0500 08/19/13 0710 08/20/13 0458 08/20/13 0830  HGB 9.0* 8.8* 9.2*  --   HCT 28.8* 27.2* 29.4*  --   PLT 360 320 322  --   LABPROT 27.9* 25.7* 38.1* 26.0*  INR 2.72* 2.44* 4.09* 2.48*  HEPARINUNFRC 0.48 0.46 0.48  --   CREATININE 0.95  --  1.03  --     The CrCl is unknown because both a height and weight (above a minimum accepted value) are required for this calculation.  Assessment: 53 yo M with LVAD admitted 07/17/2013 with elevated trop/NSTEMI.  Pharmacy consulted to dose heparin for possible pump thrombosis  Events: Initally INR reported as 4 this AM, upon recheck it is 2.48 (more consistent with expected value. INR stable, therapeutic, heparin level at goal.  Noted bleeding after blood draw this AM, resolved after holding pressure x 15 min per RN report.  Likely to [facility] on Monday for pump exchange preparation.  Coag: LVAD, ACS, pump thrombosis - heparin started on admit> Heparin held for bleeding from mouth after intubation/nose improved -heparin stopped 2/16 after INR > 2 x48hr - surgery feels pt too high risk for PEG - panda clotted - exchanged 2/19 > heparin restarted 2/22.  LDH remains elevated, but slight downward trend.  PTA Warfarin: 5 qd 6 Fri but took 10mg  for 2-3 days pta for INR 1.2 week pta (took extra Coum Thu/Fri pta due to low INR)  Goal of Therapy:  INR 2-3 Monitor platelets by anticoagulation protocol: Yes Heparin level = 0.3-0.5  Plan:  Warfarin 5 mg po x 1. Continue Heparin at current rate Continue daily heparin  level and CBC.  Tad Moore, BCPS  Clinical Pharmacist Pager [phone]  08/20/2013 9:45 AM '

## 2013-08-20 NOTE — Progress Notes (Signed)
NUTRITION FOLLOW UP  Intervention:   1.  Enteral nutrition; Continue Jevity 1.2 @ 60 mL/hr goal with Prostat BID to provide 1928 kcal, 109g protein, 1180 mL free water. 2.  Free water; Continue free water at 150 mL q 6 hrs to remains consistent with free water provision.  TF + flushes will provide 1780 mL free water.   Nutrition Dx:   Inadequate oral intake, ongoing  Monitor:   1. Enteral nutrition; initiation with tolerance. Pt to meet >/=90% estimated needs with nutrition support.  2. Wt/wt change; monitor trends  Assessment:   Patient with PMH of severe CHF, mitral regurgitation and HTN; s/p LVAD placement at [facility] March 2013, as bridge to transplant; admitted for evaluation of new NSTEMI possibly related to pump thrombosis.  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 Trach placed 2/4.  Pt is not a candidate for PEG placement. Now on Jevity 1.2 @ 60 mL/hr with protein supplements.   Wt now 181 lbs. Admission wt: 202 lbs.   To [facility] next week.    Height: Ht Readings from Last 1 Encounters:  07/17/13 5\' 4"  (1.626 m)    Weight Status:   Wt Readings from Last 1 Encounters:  08/19/13 181 lb 6.4 oz (82.283 kg)    Re-estimated needs:  Kcal: 1700-1900  Protein: 100-115 gm  Fluid: per MD  Skin: intact  Diet Order: NPO   Intake/Output Summary (Last 24 hours) at 08/20/13 0933 Last data filed at 08/20/13 0804  Gross per 24 hour  Intake      0 ml  Output    650 ml  Net   -650 ml    Last BM: 2/26, distention  Labs:   Recent Labs Lab 08/16/13 0605 08/18/13 0500 08/20/13 0458  NA 137 138 138  K 4.1 4.0 4.6  CL 98 97 99  CO2 27 29 29   BUN 20 21 15   CREATININE 0.91 0.95 1.03  CALCIUM 8.7 9.0 9.3  GLUCOSE 101* 115* 114*    CBG (last 3)   Recent Labs  08/19/13 1454 08/19/13 2148 08/20/13 0650  GLUCAP 107* 103* 128*    Scheduled Meds: . allopurinol  100 mg Oral Daily  . antiseptic oral rinse  15  mL Mouth Rinse QID  . aspirin  81 mg Per NG tube Daily  . chlorhexidine  15 mL Mouth Rinse BID  . citalopram  10 mg Per Tube Daily  . feeding supplement (PRO-STAT SUGAR FREE 64)  30 mL Per Tube BID  . free water  150 mL Per Tube 4 times per day  . furosemide  40 mg Per Tube Daily  . glycopyrrolate  1 mg Per Tube QPC breakfast  . magnesium oxide  400 mg Per Tube Daily  . pantoprazole sodium  40 mg Per Tube Q1200  . sodium chloride  10-40 mL Intracatheter Q12H  . spironolactone  25 mg Per Tube Daily  . Warfarin - Pharmacist Dosing Inpatient   Does not apply q1800    Continuous Infusions: . sodium chloride 20 mL/hr at 08/19/13 0506  . feeding supplement (JEVITY 1.2 CAL) 1,000 mL (08/19/13 1509)  . heparin 1,200 Units/hr (08/20/13 0347)    Loyce Dys, MS RD LDN Clinical Inpatient Dietitian Pager: [phone] Weekend/After hours pager: [phone]

## 2013-08-20 NOTE — Progress Notes (Signed)
Occupational Therapy Session Note  Patient Details  Name: [name] MRN: [id number] Date of Birth: [birth year]  Today's Date: 08/20/2013 Time: 9470-9628 Time Calculation (min): 55 min  Short Term Goals: Week 2:  OT Short Term Goal 1 (Week 2): Pt will complete bathing with min assist OT Short Term Goal 2 (Week 2): Pt will complete LB dressing with min assist OT Short Term Goal 3 (Week 2): Pt will complete toilet transfer with min assist OT Short Term Goal 4 (Week 2): Pt will utilize LUE as gross assist to change LVAD hookup from wall power to battery  Skilled Therapeutic Interventions/Progress Updates:    Engaged in ADL retraining with focus on functional use of LUE during bathing, dressing, and setup of LVAD battery and holster. Pt in bed upon arrival reporting feeling "so so" and weaker than normal.  Pt with loss of blood during AM lab draw with RN and MD aware, could be attributing to weak status.  Engaged in bathing and dressing at sit > stand level at EOB with pt demonstrating functional use of LUE with washing Rt arm, intermittent support at Lt elbow to when washing Rt underarm and applying deodorant.  Unable to don shirt due to constant PICC lines.  Pt required backward chaining with threading LLE and min/steady assist when pulling pants over hips in standing.  Stand pivot transfer to w/c at end of session.  Hand over hand assist at Lt hand when unscrewing LVAD plugs to hook into battery pack due to decreased grasp.  Therapy Documentation Precautions:  Precautions Precautions: Fall Precaution Comments: LVAD, PICC line, NG tube, trach on supplemental 02; Temp greater than or equal to 100.0 degrees fahrenheit PO. Doppler MAP less than 60 or greater than 90; Heart Rate less than 50 or greater than 120; Urine Output less than 240cc in 8 hours or less than 30 cc/hr; O2 saturation less than 90%; CVP greater than 18 or less than 6.; NO CHEST COMPRESSIONS Restrictions Weight Bearing  Restrictions: No General:   Vital Signs: Therapy Vitals Pulse Rate: 91 Resp: 19 Patient Position, if appropriate: Sitting Oxygen Therapy SpO2: 99 % O2 Device: Trach collar O2 Flow Rate (L/min): 5 L/min FiO2 (%): 28 % Pain:  Pt with no c/o pain   See FIM for current functional status  Therapy/Group: Individual Therapy  Rosalio Loud 08/20/2013, 12:11 PM

## 2013-08-20 NOTE — Progress Notes (Signed)
Physical Therapy Session Note  Patient Details  Name: [name] MRN: [id number] Date of Birth: 19-Feb-1961  Today's Date: 08/20/2013 Time: 6468-0321 Time Calculation (min): 56 min  Short Term Goals: Week 2:  PT Short Term Goal 1 (Week 2): Pt will perform bed mobility with supervision  to L and R with HOB elevated  PT Short Term Goal 2 (Week 2): Pt will perform bed <> w/c transfers stand pivot min A to L and R  PT Short Term Goal 3 (Week 2): Pt will perform gait x 150' in controlled environment with mod A of one person PT Short Term Goal 4 (Week 2): Pt will perform up/down 2 stairs with one rail and min A  Skilled Therapeutic Interventions/Progress Updates:   Pt in w/c; pt aware that he will be transferred to [facility] on Monday for LVAD replacement.  Pt writing questions on pad about will he be able to continue rehab because he still wants to go home?  Attempted to answer pt questions and discussed with pt that the main priority right now is to replace the LVAD to prevent future infarcts and then based on his functional level after surgery he may be able to D/C from acute or return to rehab prior to D/C home but that will be up to his MD and care team.  Pt set up and transported to gym in w/c total A.  Performed transfers stand pivot w/c <> mat min A.  Performed NMR from mat and with stairs; see below for details.  Performed stair negotiation training up/down 5 stairs with bilat UE support on rails with focus on use of alternating sequence for LE strengthening and weight shift training.  Returned to room in w/c total A.  Pt requesting to stay up in w/c for a little longer.    Therapy Documentation Precautions:  Precautions Precautions: Fall Precaution Comments: LVAD, PICC line, NG tube, trach on supplemental 02; Temp greater than or equal to 100.0 degrees fahrenheit PO. Doppler MAP less than 60 or greater than 90; Heart Rate less than 50 or greater than 120; Urine Output less than 240cc in 8 hours  or less than 30 cc/hr; O2 saturation less than 90%; CVP greater than 18 or less than 6.; NO CHEST COMPRESSIONS Restrictions Weight Bearing Restrictions: No Vital Signs: Therapy Vitals Temp: 98.7 F (37.1 C) Pulse Rate: 82 Resp: 16 Patient Position, if appropriate: Lying Oxygen Therapy SpO2: 99 % O2 Device: Trach collar O2 Flow Rate (L/min): 5 L/min FiO2 (%): 28 % Pain: Pain Assessment Pain Assessment: No/denies pain Other Treatments: Treatments Neuromuscular Facilitation: Left;Lower Extremity;Activity to increase motor control;Activity to increase timing and sequencing;Activity to increase sustained activation;Activity to increase lateral weight shifting;Activity to increase anterior-posterior weight shifting during sit <> stand from mat at elevated position > low position without use of UE and focus on increased weight bearing and activation through LLE during sit <> stand.  Also performed NMR for LLE extension activation on stairs with 2 sets x 10 reps forwards and L lateral step ups with and without UE support with min-mod A and tactile cues for full LLE hip and knee extension.  Required sitting rest breaks for breathing and to assess Sp02.     See FIM for current functional status  Therapy/Group: Individual Therapy  Edman Circle [facility] 08/20/2013, 5:08 PM

## 2013-08-20 NOTE — Progress Notes (Addendum)
Subjective/Complaints: 53 y.o. 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 He is s/p LVAD placement at [facility] March 2013, as bridge to transplant. He was admitted for evaluation of new NSTEMI felt possibly to be related to pump thrombosis. Hosp course complicated by episodes of CP and hypotension on 1/24 pm hours requiring pressors. He . 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 Aspiration PNA treated with zosyn. TEE 1/28 without evidence of clot but with RV failure  Extubated on 07/23/13 but has difficulty handling secretions and reintubated on 07/25/13. He developed oropharyngeal bleeding and ENT evaluations did not reveal source of acute bleeding. Trach placed by Dr. Harland Dingwall on 07/28/13 and patient tolerating trach collar. He is NPO due to sever dysphagia but deemed too high risk for PEG placement by IR and CCS. Is tolerating panda tube feeds without difficulty. Trach downsized to CFS # 6 yesterday and PMSV trials attempted. Patient able to cough tracheally.  He developed hematuria with urinary retention and foley placed on 08/07/13. Has had complaints of bladder pain and foley d/c today. 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 Therapies ongoing and patient showing improvement in left hemiparesis. He continues with ideomotor/ oromotor apraxia, severe oropharyngeal dysphagia as well as aphasia   Objective: RN reported significant amt bleeding from Left forearm phlebotomy site site, removed and pressure drsg applied with resolution Need for cont trach for now  Denies SOB Coughing up moderate clear secretions Appreciate cardiology note Review of Systems - cannot obtain secondary to reduced speech   Vital Signs: Pulse 90, temperature 98.6 F (37 C), temperature source Oral, resp. rate 18, weight 82.283 kg (181 lb 6.4 oz),  SpO2 98.00%. Dg Chest Port 1 View  08/19/2013   CLINICAL DATA:  Stroke, cough, recent tracheostomy  EXAM: PORTABLE CHEST - 1 VIEW  COMPARISON:  08/04/2013  FINDINGS: Stable tracheostomy tube position. Feeding tube is unchanged in position. One lead cardiac pacemaker again noted. Stable left ventricular assist device position. No pulmonary edema. Again noted left basilar atelectasis or infiltrate. Right PICC line is unchanged in position. Right lung is clear.  IMPRESSION: Stable support apparatus. No pulmonary edema. Again noted left basilar atelectasis or infiltrate.   Electronically Signed   By: Lahoma Crocker M.D.   On: 08/19/2013 09:36   Results for orders placed during the hospital encounter of 08/11/13 (from the past 72 hour(s))  GLUCOSE, CAPILLARY     Status: Abnormal   Collection Time    08/17/13  9:17 PM      Result Value Ref Range   Glucose-Capillary 109 (*) 70 - 99 mg/dL  BASIC METABOLIC PANEL     Status: Abnormal   Collection Time    08/18/13  5:00 AM      Result Value Ref Range   Sodium 138  137 - 147 mEq/L   Potassium 4.0  3.7 - 5.3 mEq/L   Chloride 97  96 - 112 mEq/L   CO2 29  19 - 32 mEq/L   Glucose, Bld 115 (*) 70 - 99 mg/dL   BUN 21  6 - 23 mg/dL   Creatinine, Ser 0.95  0.50 - 1.35 mg/dL   Calcium 9.0  8.4 - 10.5 mg/dL   GFR calc non Af Amer >90  >90 mL/min   GFR calc Af Amer >90  >90 mL/min   Comment: (NOTE)  The eGFR has been calculated using the CKD EPI equation.     This calculation has not been validated in all clinical situations.     eGFR's persistently <90 mL/min signify possible Chronic Kidney     Disease.  LACTATE DEHYDROGENASE     Status: Abnormal   Collection Time    08/18/13  5:00 AM      Result Value Ref Range   LDH 1234 (*) 94 - 250 U/L  PROTIME-INR     Status: Abnormal   Collection Time    08/18/13  5:00 AM      Result Value Ref Range   Prothrombin Time 27.9 (*) 11.6 - 15.2 seconds   INR 2.72 (*) 0.00 - 1.49  CBC     Status: Abnormal    Collection Time    08/18/13  5:00 AM      Result Value Ref Range   WBC 4.8  4.0 - 10.5 K/uL   RBC 3.26 (*) 4.22 - 5.81 MIL/uL   Hemoglobin 9.0 (*) 13.0 - 17.0 g/dL   HCT 28.8 (*) 39.0 - 52.0 %   MCV 88.3  78.0 - 100.0 fL   MCH 27.6  26.0 - 34.0 pg   MCHC 31.3  30.0 - 36.0 g/dL   RDW 15.5  11.5 - 15.5 %   Platelets 360  150 - 400 K/uL  HEPARIN LEVEL (UNFRACTIONATED)     Status: None   Collection Time    08/18/13  5:00 AM      Result Value Ref Range   Heparin Unfractionated 0.48  0.30 - 0.70 IU/mL   Comment:            IF HEPARIN RESULTS ARE BELOW     EXPECTED VALUES, AND PATIENT     DOSAGE HAS BEEN CONFIRMED,     SUGGEST FOLLOW UP TESTING     OF ANTITHROMBIN III LEVELS.  GLUCOSE, CAPILLARY     Status: Abnormal   Collection Time    08/18/13  5:41 AM      Result Value Ref Range   Glucose-Capillary 107 (*) 70 - 99 mg/dL  GLUCOSE, CAPILLARY     Status: Abnormal   Collection Time    08/18/13  3:28 PM      Result Value Ref Range   Glucose-Capillary 111 (*) 70 - 99 mg/dL  GLUCOSE, CAPILLARY     Status: Abnormal   Collection Time    08/18/13  9:26 PM      Result Value Ref Range   Glucose-Capillary 105 (*) 70 - 99 mg/dL  GLUCOSE, CAPILLARY     Status: Abnormal   Collection Time    08/19/13  5:07 AM      Result Value Ref Range   Glucose-Capillary 117 (*) 70 - 99 mg/dL  PROTIME-INR     Status: Abnormal   Collection Time    08/19/13  7:10 AM      Result Value Ref Range   Prothrombin Time 25.7 (*) 11.6 - 15.2 seconds   INR 2.44 (*) 0.00 - 1.49  CBC     Status: Abnormal   Collection Time    08/19/13  7:10 AM      Result Value Ref Range   WBC 4.6  4.0 - 10.5 K/uL   RBC 3.10 (*) 4.22 - 5.81 MIL/uL   Hemoglobin 8.8 (*) 13.0 - 17.0 g/dL   HCT 27.2 (*) 39.0 - 52.0 %   MCV 87.7  78.0 - 100.0 fL   MCH  28.4  26.0 - 34.0 pg   MCHC 32.4  30.0 - 36.0 g/dL   RDW 15.7 (*) 11.5 - 15.5 %   Platelets 320  150 - 400 K/uL  HEPARIN LEVEL (UNFRACTIONATED)     Status: None   Collection  Time    08/19/13  7:10 AM      Result Value Ref Range   Heparin Unfractionated 0.46  0.30 - 0.70 IU/mL   Comment:            IF HEPARIN RESULTS ARE BELOW     EXPECTED VALUES, AND PATIENT     DOSAGE HAS BEEN CONFIRMED,     SUGGEST FOLLOW UP TESTING     OF ANTITHROMBIN III LEVELS.  SURGICAL PCR SCREEN     Status: None   Collection Time    08/19/13 11:10 AM      Result Value Ref Range   MRSA, PCR NEGATIVE  NEGATIVE   Staphylococcus aureus NEGATIVE  NEGATIVE   Comment:            The Xpert SA Assay (FDA     approved for NASAL specimens     in patients over 57 years of age),     is one component of     a comprehensive surveillance     program.  Test performance has     been validated by [employer] for patients greater     than or equal to 73 year old.     It is not intended     to diagnose infection nor to     guide or monitor treatment.  BLOOD GAS, ARTERIAL     Status: Abnormal   Collection Time    08/19/13 12:26 PM      Result Value Ref Range   FIO2 0.28     O2 Content 5.0     Delivery systems TRACH COLLAR/TRACH TUBE     pH, Arterial 7.480 (*) 7.350 - 7.450   pCO2 arterial 36.1  35.0 - 45.0 mmHg   pO2, Arterial 95.8  80.0 - 100.0 mmHg   Bicarbonate 26.6 (*) 20.0 - 24.0 mEq/L   TCO2 27.7  0 - 100 mmol/L   Acid-Base Excess 3.2 (*) 0.0 - 2.0 mmol/L   O2 Saturation 98.6     Patient temperature 98.6     Collection site LEFT RADIAL     Drawn by [phone]     Sample type ARTERIAL     Allens test (pass/fail) PASS  PASS  GLUCOSE, CAPILLARY     Status: Abnormal   Collection Time    08/19/13  2:54 PM      Result Value Ref Range   Glucose-Capillary 107 (*) 70 - 99 mg/dL   Comment 1 Notify RN    GLUCOSE, CAPILLARY     Status: Abnormal   Collection Time    08/19/13  9:48 PM      Result Value Ref Range   Glucose-Capillary 103 (*) 70 - 99 mg/dL  LACTATE DEHYDROGENASE     Status: Abnormal   Collection Time    08/20/13  4:58 AM      Result Value Ref Range   LDH 1144 (*) 94  - 250 U/L  PROTIME-INR     Status: Abnormal   Collection Time    08/20/13  4:58 AM      Result Value Ref Range   Prothrombin Time 38.1 (*) 11.6 - 15.2 seconds   INR 4.09 (*)  0.00 - 1.49  CBC     Status: Abnormal   Collection Time    08/20/13  4:58 AM      Result Value Ref Range   WBC 5.0  4.0 - 10.5 K/uL   RBC 3.34 (*) 4.22 - 5.81 MIL/uL   Hemoglobin 9.2 (*) 13.0 - 17.0 g/dL   HCT 29.4 (*) 39.0 - 52.0 %   MCV 88.0  78.0 - 100.0 fL   MCH 27.5  26.0 - 34.0 pg   MCHC 31.3  30.0 - 36.0 g/dL   RDW 15.7 (*) 11.5 - 15.5 %   Platelets 322  150 - 400 K/uL  HEPARIN LEVEL (UNFRACTIONATED)     Status: None   Collection Time    08/20/13  4:58 AM      Result Value Ref Range   Heparin Unfractionated 0.48  0.30 - 0.70 IU/mL   Comment:            IF HEPARIN RESULTS ARE BELOW     EXPECTED VALUES, AND PATIENT     DOSAGE HAS BEEN CONFIRMED,     SUGGEST FOLLOW UP TESTING     OF ANTITHROMBIN III LEVELS.  COMPREHENSIVE METABOLIC PANEL     Status: Abnormal   Collection Time    08/20/13  4:58 AM      Result Value Ref Range   Sodium 138  137 - 147 mEq/L   Potassium 4.6  3.7 - 5.3 mEq/L   Chloride 99  96 - 112 mEq/L   CO2 29  19 - 32 mEq/L   Glucose, Bld 114 (*) 70 - 99 mg/dL   BUN 15  6 - 23 mg/dL   Creatinine, Ser 1.03  0.50 - 1.35 mg/dL   Calcium 9.3  8.4 - 10.5 mg/dL   Total Protein 7.4  6.0 - 8.3 g/dL   Albumin 2.9 (*) 3.5 - 5.2 g/dL   AST 46 (*) 0 - 37 U/L   ALT 18  0 - 53 U/L   Alkaline Phosphatase 85  39 - 117 U/L   Total Bilirubin 0.7  0.3 - 1.2 mg/dL   GFR calc non Af Amer 83 (*) >90 mL/min   GFR calc Af Amer >90  >90 mL/min   Comment: (NOTE)     The eGFR has been calculated using the CKD EPI equation.     This calculation has not been validated in all clinical situations.     eGFR's persistently <90 mL/min signify possible Chronic Kidney     Disease.  AMYLASE     Status: None   Collection Time    08/20/13  4:58 AM      Result Value Ref Range   Amylase 58  0 - 105 U/L   GLUCOSE, CAPILLARY     Status: Abnormal   Collection Time    08/20/13  6:50 AM      Result Value Ref Range   Glucose-Capillary 128 (*) 70 - 99 mg/dL     HEENT: thrush Cardio: LVAD hum, no S1 or S2 Resp: normal breath sounds GI: BS positive, Distention and NT Extremity:  No Edema Skin:   Intact and  no irritation around panda or trach Neuro: Lethargic, Cranial Nerve II-XII normal, Abnormal Sensory difficult to assess secondary to aphasia and Aphasic, Motor 4/5 RUE and RLE, 3- Left UE and 3- LLE Musc/Skel:  Normal GEN NAD   Assessment/Plan: 1. Functional deficits secondary to Bilateral BG embolic infarct with Left HP, Aphasia vs dysarthria which  require 3+ hours per day of interdisciplinary therapy in a comprehensive inpatient rehab setting. Physiatrist is providing close team supervision and 24 hour management of active medical problems listed below. Physiatrist and rehab team continue to assess barriers to discharge/monitor patient progress toward functional and medical goals. FIM: FIM - Bathing Bathing Steps Patient Completed: Chest;Right Arm;Left Arm;Abdomen;Front perineal area;Right upper leg;Left upper leg Bathing: 3: Mod-Patient completes 5-7 37f10 parts or 50-74%  FIM - Upper Body Dressing/Undressing Upper body dressing/undressing steps patient completed: Thread/unthread right sleeve of pullover shirt/dresss;Put head through opening of pull over shirt/dress Upper body dressing/undressing: 0: Wears gown/pajamas-no public clothing FIM - Lower Body Dressing/Undressing Lower body dressing/undressing steps patient completed: Thread/unthread right pants leg;Pull pants up/down Lower body dressing/undressing: 2: Max-Patient completed 25-49% of tasks  FIM - Toileting Toileting steps completed by patient: Performs perineal hygiene;Adjust clothing after toileting Toileting Assistive Devices: Grab bar or rail for support Toileting: 3: Mod-Patient completed 2 of 3 steps  FIM -  TRadio producerDevices: Elevated toilet seat;Grab bars Toilet Transfers: 4-To toilet/BSC: Min A (steadying Pt. > 75%);4-From toilet/BSC: Min A (steadying Pt. > 75%)  FIM - Bed/Chair Transfer Bed/Chair Transfer Assistive Devices: Arm rests;HOB elevated Bed/Chair Transfer: 5: Supine > Sit: Supervision (verbal cues/safety issues);3: Bed > Chair or W/C: Mod A (lift or lower assist)  FIM - Locomotion: Wheelchair Distance: 150 Locomotion: Wheelchair: 1: Total Assistance/staff pushes wheelchair (Pt<25%) FIM - Locomotion: Ambulation Locomotion: Ambulation Assistive Devices: Walker - Rolling (hand orthosis) Ambulation/Gait Assistance: 1: +2 Total assist Locomotion: Ambulation: 1: Two helpers  Comprehension Comprehension Mode: Auditory Comprehension: 5-Understands complex 90% of the time/Cues < 10% of the time  Expression Expression Mode: Verbal Expression Assistive Devices: 6-Talk trach valve Expression: 2-Expresses basic 25 - 49% of the time/requires cueing 50 - 75% of the time. Uses single words/gestures.  Social Interaction Social Interaction: 5-Interacts appropriately 90% of the time - Needs monitoring or encouragement for participation or interaction.  Problem Solving Problem Solving: 4-Solves basic 75 - 89% of the time/requires cueing 10 - 24% of the time  Memory Memory: 4-Recognizes or recalls 75 - 89% of the time/requires cueing 10 - 24% of the time  Medical Problem List and Plan:  Embolic bilateral BG CVA's, left thalamus CVA, CHF  1. 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 Pain Management: Will monitor for signs of distress. Tylenol prn.  3. Mood: No signs of distress but question awareness/insight into deficits. Continue celexa. Will have LCSW follow along for evaluation/support as indicated.  4. Neuropsych: This patient is capable of making decisions on her own  behalf.  5. LVAD: Cardiology to follow for management/support.   -weights, I's and O's being followed closely 6. Urinary retention/Hematuria:  Started on flomax. PVR/cath prn.  7. Severe dysphagia/oral apraxia: Continue panda tube.   50 cc/hr with fluid bolus.  8. Trach dependent: Tolerating CFS #6. Continue to suction as needed pulmonary toilet- not ready for #4 per CCM -trials of PMV with SLP, since pump exchange is planned would hold off on downsize  9.  Anemia with hemolysis ? r/t pump, monitor, elevated LDH 9.  Anemia will ask cardiology if they want another H and H elevated INR with post phlebotomy bleed LOS (Days) 9 A FACE TO FACE EVALUATION WAS PERFORMED  Kennadi Albany E 08/20/2013, 7:44 AM

## 2013-08-20 NOTE — Progress Notes (Signed)
Patient ID: [name], male   DOB: 04/27/1961, 53 y.o.   MRN: [id number] Advanced Heart Failure Rounding Note  Continues to progress nicely with Rehab. Walked yesterday in gym.   INR 2.7 LDH [id number] > [phone]>1234>1144  VAD interrogated personally. Flow 4.8, Speed 9200 PI 6.7 Power 5.0  Alarms:  None.  occasional PI event. No power surges  Filed Vitals:   08/20/13 0518 08/20/13 0529 08/20/13 0640 08/20/13 0922  Pulse: 89 86 90 91  Temp:  98.6 F (37 C)    TempSrc:  Oral    Resp: 17 18 18 19   Weight:      SpO2: 99% 100% 98% 99%    Intake/Output Summary (Last 24 hours) at 08/20/13 0943 Last data filed at 08/20/13 0804  Gross per 24 hour  Intake      0 ml  Output    650 ml  Net   -650 ml   Scheduled Meds: . allopurinol  100 mg Oral Daily  . antiseptic oral rinse  15 mL Mouth Rinse QID  . aspirin  81 mg Per NG tube Daily  . chlorhexidine  15 mL Mouth Rinse BID  . citalopram  10 mg Per Tube Daily  . feeding supplement (PRO-STAT SUGAR FREE 64)  30 mL Per Tube BID  . free water  150 mL Per Tube 4 times per day  . furosemide  40 mg Per Tube Daily  . glycopyrrolate  1 mg Per Tube QPC breakfast  . magnesium oxide  400 mg Per Tube Daily  . pantoprazole sodium  40 mg Per Tube Q1200  . sodium chloride  10-40 mL Intracatheter Q12H  . spironolactone  25 mg Per Tube Daily  . Warfarin - Pharmacist Dosing Inpatient   Does not apply q1800   Continuous Infusions: . sodium chloride 20 mL/hr at 08/19/13 0506  . feeding supplement (JEVITY 1.2 CAL) 1,000 mL (08/19/13 1509)  . heparin 1,200 Units/hr (08/20/13 0347)   PRN Meds:.acetaminophen, bisacodyl, guaiFENesin-dextromethorphan, lidocaine, nitroGLYCERIN, ondansetron (ZOFRAN) IV, sodium chloride  LABS: Basic Metabolic Panel:  Recent Labs  65/78/4602/25/15 0500 08/20/13 0458  NA 138 138  K 4.0 4.6  CL 97 99  CO2 29 29  GLUCOSE 115* 114*  BUN 21 15  CREATININE 0.95 1.03  CALCIUM 9.0 9.3   Liver Function Tests:  Recent Labs  08/20/13 0458  AST 46*  ALT 18  ALKPHOS 85  BILITOT 0.7  PROT 7.4  ALBUMIN 2.9*    Recent Labs  08/20/13 0458  AMYLASE 58   CBC:  Recent Labs  08/19/13 0710 08/20/13 0458  WBC 4.6 5.0  HGB 8.8* 9.2*  HCT 27.2* 29.4*  MCV 87.7 88.0  PLT 320 322   Cardiac Enzymes: No results found for this basename: CKTOTAL, CKMB, CKMBINDEX, TROPONINI,  in the last 72 hours RADIOLOGY: Dg Chest Portable 1 View  07/17/2013   CLINICAL DATA:  Left-sided chest pain for 3 days, weakness and dizziness.  EXAM: PORTABLE CHEST - 1 VIEW  COMPARISON:  08/03/2012  FINDINGS: Evidence of median sternotomy noted with LVAD in place. Left-sided defibrillator noted. Mild prominence of the cardiac silhouette persists. Tricuspid valvuloplasty reidentified. Trace left pleural fluid or thickening persists. No new pulmonary opacity.  IMPRESSION: No new acute abnormality. Stable appearance of trace left pleural fluid or thickening.   Electronically Signed   By: Christiana PellantGretchen  Green M.D.   On: 07/17/2013 10:12    PHYSICAL EXAM-  General: NAD; on trach collar; sitting in WC watching TV Neck:  JVP 6-7; trach collar; clear secretions Lungs: coarse throughout CV: LVAD pump hum normal.   Abdomen: Nontender no hepatosplenomegaly, no distention. + BS Neurologic: awake interactive.  Moving left leg,and L arm Extremities: No clubbing or cyanosis.  RUE PICC. Has arm sleeve    Assessment:   1. Acute embolic CVA 2. 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  3. 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 NICM EF 15%  5. Anemia 6. Respiratory failure s/p trach    Plan/Discussion:    Doing very well with rehab. But failed repeat swallow study. Continue aggressive therapy. Continue current lasix dose.   INR therapeutic. Continue heparin. Hgb stable. LDH down a bit. No evidence pump dysfunction or power surges.  I spoke with Drs. ONEOK and it is felt that we should proceed with pump  exchange next week at [facility]. I discussed this with Mr. Itkin and he is willing to proceed. Wants to stay here over the weekend which I think is optimum so that he will get more rehab. Will continue heparin and coumadin for now. Plavix stopped.   VAD interrogated personally.    Arvilla Meres MD 9:43 AM  Advanced Heart Failure Team Pager [phone] (M-F; 7a - 4p)  Please contact [facility] for night-coverage after hours (4p -7a ) and weekends on [email or web address]

## 2013-08-20 NOTE — Plan of Care (Signed)
Problem: Inadequate Airway Clearance Goal: Patient's airway will be maintained Outcome: Progressing # 6 trach,uanble to tolerate PMV except very short periods.

## 2013-08-20 NOTE — Progress Notes (Signed)
Nursing Note: Dr. Wynn Banker on unit and made aware of event and soiled linen left for him to assess.wbb

## 2013-08-20 NOTE — Progress Notes (Addendum)
Nursing Note: Entered room to check BP for the am and when I pulled the covers back,there was a pool of bright red blood on pt's left side.I searched for a source of bleede and asked the pt but he was asleep and was unaware he was  bleeding.I noted a small dsg to his left inner arm at the antecubital area where pt had obviously had labs drawn.I asked the pt if lab had drawn labs on him and he said yes.I held pressure for 15 minutes .Checked BP for 100 as pt has LVAD.Pt and  bed changed ,cleaned and Dr.Kirsteins made aware of events  and H& H ordered for this am.wbbPt was actiivly bleeding when I removed the piece of gauge.I held pressure till bleeding stopped and placed a pressure dressing and educated pt to call his nurse right away if he noted any more bleeding.I called his mom and made her aware of all events.wbb

## 2013-08-20 NOTE — Progress Notes (Signed)
Speech Language Pathology Daily Session Note  Patient Details  Name: [name] MRN: [id number] Date of Birth: 01-24-1961  Today's Date: 08/20/2013 Time: 1115-1200 Time: 1300-1330 Time Calculation: 75 minutes total (45 min session, 30 minute session)  Short Term Goals: Week 2: SLP Short Term Goal 1 (Week 2): Patient will tolerate PMSV for 15-20 minutes with clinician supervision  SLP Short Term Goal 1 - Progress (Week 2): Progressing toward goal SLP Short Term Goal 2 (Week 2): Patient will self-monitor and manage secreations with Min clinician cues. SLP Short Term Goal 3 (Week 2): Patient will utilize speech intelligibility compensatory strategies with Mod clinician cues. SLP Short Term Goal 4 (Week 2): Patient will consume trials of ice chips with minimal overt s/s of aspiration with 50% of trials and Min clinician cues. SLP Short Term Goal 4 - Progress (Week 2): Progressing toward goal  Skilled Therapeutic Interventions: Skilled treatment session focused on addressing patient's tolerationg of PMSV, initiating voicing during speech production, and dysphagia goals. SLP assisted patient and provided oral care and cues for patient to perform hard cough/throat clear . Patient able to don his PMV but required some assistance in adjusting his oxygen . Patient initially did not even attempt to mouth words, however after maximal cues and SLP asking him open-ended questions, he did respond in a whisper with single words and short (2-4 word phrases). Patient also pointed and gestured to augment some of his responses. SLP demonstrated and cued patient to perform throat clear and attempt to maintain the voicing that he achieved when clearing throat. Patient was able to acheive voicing when clearing throat and maintain that for 1-2 seconds with maximal cues. SLP provided patient with trials of ice chips with cues for him to perform hard swallow and perform throat clear. Oxygen saturation remained at 97-99%  while PMV was in place, and it dropped to 94% two times when patient was coughing and expectorating a mild amount of clear secretions from trach. Oxygen saturation returned to 97-99% quickly after recovery from coughing. Continue with current plan of care.    FIM:  Expression Expression Mode: Verbal Expression: 2-Expresses basic 25 - 49% of the time/requires cueing 50 - 75% of the time. Uses single words/gestures. Social Interaction Social Interaction: 5-Interacts appropriately 90% of the time - Needs monitoring or encouragement for participation or interaction. Problem Solving Problem Solving: 4-Solves basic 75 - 89% of the time/requires cueing 10 - 24% of the time Memory Memory: 4-Recognizes or recalls 75 - 89% of the time/requires cueing 10 - 24% of the time  Pain    Therapy/Group: Individual Therapy  Pablo Lawrence 08/20/2013, 3:22 PM  Angela Nevin, MA, CCC-SLP [facility] Speech-Language Pathologist

## 2013-08-20 NOTE — Progress Notes (Signed)
Echocardiogram 2D Echocardiogram has been performed.  [name] 08/20/2013, 3:53 PM

## 2013-08-21 ENCOUNTER — Inpatient Hospital Stay (HOSPITAL_COMMUNITY): Payer: Medicare Other | Admitting: *Deleted

## 2013-08-21 DIAGNOSIS — I749 Embolism and thrombosis of unspecified artery: Secondary | ICD-10-CM

## 2013-08-21 LAB — CBC
HCT: 30.6 % — ABNORMAL LOW (ref 39.0–52.0)
Hemoglobin: 9.7 g/dL — ABNORMAL LOW (ref 13.0–17.0)
MCH: 27.9 pg (ref 26.0–34.0)
MCHC: 31.7 g/dL (ref 30.0–36.0)
MCV: 87.9 fL (ref 78.0–100.0)
Platelets: 377 10*3/uL (ref 150–400)
RBC: 3.48 MIL/uL — AB (ref 4.22–5.81)
RDW: 15.9 % — ABNORMAL HIGH (ref 11.5–15.5)
WBC: 6.2 10*3/uL (ref 4.0–10.5)

## 2013-08-21 LAB — HEPARIN LEVEL (UNFRACTIONATED): HEPARIN UNFRACTIONATED: 0.5 [IU]/mL (ref 0.30–0.70)

## 2013-08-21 LAB — PROTIME-INR
INR: 2.32 — AB (ref 0.00–1.49)
PROTHROMBIN TIME: 24.7 s — AB (ref 11.6–15.2)

## 2013-08-21 LAB — GLUCOSE, CAPILLARY
GLUCOSE-CAPILLARY: 127 mg/dL — AB (ref 70–99)
GLUCOSE-CAPILLARY: 105 mg/dL — AB (ref 70–99)
Glucose-Capillary: 110 mg/dL — ABNORMAL HIGH (ref 70–99)

## 2013-08-21 LAB — PLATELET INHIBITION P2Y12: Platelet Function  P2Y12: 96 [PRU] — ABNORMAL LOW (ref 194–418)

## 2013-08-21 MED ORDER — WARFARIN SODIUM 6 MG PO TABS
6.0000 mg | ORAL_TABLET | Freq: Once | ORAL | Status: AC
  Administered 2013-08-21: 6 mg via ORAL
  Filled 2013-08-21: qty 1

## 2013-08-21 NOTE — Progress Notes (Signed)
Subjective/Complaints: 53 y.o. 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 He is s/p LVAD placement at [facility] March 2013, as bridge to transplant. He was admitted for evaluation of new NSTEMI felt possibly to be related to pump thrombosis. Hosp course complicated by episodes of CP and hypotension on 1/24 pm hours requiring pressors. He . 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 Aspiration PNA treated with zosyn. TEE 1/28 without evidence of clot but with RV failure  Extubated on 07/23/13 but has difficulty handling secretions and reintubated on 07/25/13. He developed oropharyngeal bleeding and ENT evaluations did not reveal source of acute bleeding. Trach placed by Dr. Brooks Sailors on 07/28/13 and patient tolerating trach collar. He is NPO due to sever dysphagia but deemed too high risk for PEG placement by IR and CCS. Is tolerating panda tube feeds without difficulty. Trach downsized to CFS # 6 , PMSV trials attempted. Patient able to cough tracheally.  He developed hematuria with urinary retention and foley placed on 08/07/13. Has had complaints of bladder pain and foley d/c today. 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 Therapies ongoing and patient showing improvement in left hemiparesis. He continues with ideomotor/ oromotor apraxia, severe oropharyngeal dysphagia as well as aphasia   Objective:  patient is able to answer questions by shaking his head. He does not speak. He denies pain.   Vital Signs: Pulse 90, temperature 98.3 F (36.8 C), temperature source Oral, resp. rate 18, weight 222 lb 0.1 oz (100.7 kg), SpO2 98.00%.  Chronically ill-appearing. [name] is in place. Chest with few rhonchi that clear with coughing. Cardiac exam: Unable to hear heart sounds. Machine sounds at the that are identifiable. Extremities no  significant edema.  Assessment/Plan: 1. Functional deficits secondary to Bilateral BG embolic infarct with Left HP, Aphasia vs dysarthria   Medical Problem List and Plan:  Embolic bilateral BG CVA's, left thalamus CVA, CHF  1. 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  2. Pain Management: Tylenol prn.  3. Mood: No signs of distress  4. Neuropsych: This patient is capable of making decisions on her own behalf.  5. LVAD: Cardiology to follow for management/support.   -weights, I's and O's being followed closely- discussed with cardiology yesterday. 6. Urinary retention/Hematuria:  Started on flomax. PVR/cath prn.  7. Severe dysphagia/oral apraxia: Continue panda tube.   50 cc/hr with fluid bolus.  8. Trach dependent: Tolerating CFS #6. Continue to suction as needed pulmonary toilet- not ready for #4 per CCM -trials of PMV with SLP, since pump exchange is planned would hold off on downsize   9.  Anemia with hemolysis ? r/t pump, monitor, elevated LDH 9.  Anemia will ask cardiology if they want another H and H elevated INR with post phlebotomy bleed  Lab Results  Component Value Date   HGB 9.0* 08/20/2013    LOS (Days) 10 A FACE TO FACE EVALUATION WAS PERFORMED  Renda Pohlman HENRY 08/21/2013, 10:19 AM

## 2013-08-21 NOTE — Progress Notes (Addendum)
Physical Therapy Note  Patient Details  Name: Emeka Rouch MRN: [id number] Date of Birth: [birth year] Today's Date: 08/21/2013  1530-1620 (50 minutes) individual Pain: no reported pain Other: Oxygen sats >92% during treatment session on 5L trach collar (28%) Focus of treatment: gait training/ activity tolerance Treatment: Pt in bed upon arrival; pt switched to battery power; gait 60 feet X 3 RW min assist with hand splint left + second person to handle IV pole/ oxygen tank  ; see oxygen sats above; returned to bed and switched to power module; all needs within reach.    Jaeda Bruso,JIM 08/21/2013, 4:22 PM

## 2013-08-21 NOTE — Progress Notes (Signed)
Patient ID: [name], male   DOB: 02/23/1961, 53 y.o.   MRN: [id number] Advanced Heart Failure Rounding Note  Continues to progress nicely with Rehab. Getting ready to walk today.    INR 2.3 LDH [id number]>[id number]>1174 > 262 087 [id number]>1234>1144  VAD interrogated personally. Flow 5.2, Speed 9200 PI 6.6 Power 5.6  Alarms:  None.  7 PI events today, similar to prior days. No power surges  Filed Vitals:   08/21/13 0642 08/21/13 0747 08/21/13 1201 08/21/13 1506  Pulse: 90     Temp:      TempSrc:      Resp:      Weight:      SpO2:  98% 100% 100%    Intake/Output Summary (Last 24 hours) at 08/21/13 1553 Last data filed at 08/21/13 0800  Gross per 24 hour  Intake     10 ml  Output    950 ml  Net   -940 ml   Scheduled Meds: . allopurinol  100 mg Oral Daily  . antiseptic oral rinse  15 mL Mouth Rinse QID  . aspirin  81 mg Per NG tube Daily  . chlorhexidine  15 mL Mouth Rinse BID  . citalopram  10 mg Per Tube Daily  . feeding supplement (PRO-STAT SUGAR FREE 64)  30 mL Per Tube BID  . free water  150 mL Per Tube 4 times per day  . furosemide  40 mg Per Tube Daily  . glycopyrrolate  1 mg Per Tube QPC breakfast  . magnesium oxide  400 mg Per Tube Daily  . pantoprazole sodium  40 mg Per Tube Q1200  . sodium chloride  10-40 mL Intracatheter Q12H  . spironolactone  25 mg Per Tube Daily  . warfarin  6 mg Oral ONCE-1800  . Warfarin - Pharmacist Dosing Inpatient   Does not apply q1800   Continuous Infusions: . sodium chloride 20 mL/hr at 08/19/13 0506  . feeding supplement (JEVITY 1.2 CAL) 50 mL/hr at 08/21/13 0642  . heparin 1,200 Units/hr (08/21/13 0909)   PRN Meds:.acetaminophen, bisacodyl, guaiFENesin-dextromethorphan, lidocaine, nitroGLYCERIN, ondansetron (ZOFRAN) IV, sodium chloride  LABS: Basic Metabolic Panel:  Recent Labs  65/78/4602/27/15 0458  NA 138  K 4.6  CL 99  CO2 29  GLUCOSE 114*  BUN 15  CREATININE 1.03  CALCIUM 9.3   Liver Function Tests:  Recent Labs  08/20/13 0458  AST 46*   ALT 18  ALKPHOS 85  BILITOT 0.7  PROT 7.4  ALBUMIN 2.9*    Recent Labs  08/20/13 0458  AMYLASE 58   CBC:  Recent Labs  08/20/13 0458 08/20/13 0830 08/21/13 1255  WBC 5.0  --  6.2  HGB 9.2* 9.0* 9.7*  HCT 29.4* 28.7* 30.6*  MCV 88.0  --  87.9  PLT 322  --  377   Cardiac Enzymes: No results found for this basename: CKTOTAL, CKMB, CKMBINDEX, TROPONINI,  in the last 72 hours RADIOLOGY: Dg Chest Portable 1 View  07/17/2013   CLINICAL DATA:  Left-sided chest pain for 3 days, weakness and dizziness.  EXAM: PORTABLE CHEST - 1 VIEW  COMPARISON:  08/03/2012  FINDINGS: Evidence of median sternotomy noted with LVAD in place. Left-sided defibrillator noted. Mild prominence of the cardiac silhouette persists. Tricuspid valvuloplasty reidentified. Trace left pleural fluid or thickening persists. No new pulmonary opacity.  IMPRESSION: No new acute abnormality. Stable appearance of trace left pleural fluid or thickening.   Electronically Signed   By: Christiana PellantGretchen  Green M.D.   On: 07/17/2013 10:12  PHYSICAL EXAM-  General: NAD; on trach collar; sitting in WC watching TV Neck: JVP 6-7; trach collar; clear secretions Lungs: coarse throughout CV: LVAD pump hum normal.   Abdomen: Nontender no hepatosplenomegaly, no distention. + BS Neurologic: awake interactive.  Moving left leg,and L arm Extremities: No clubbing or cyanosis.  RUE PICC. Has arm sleeve    Assessment:   1. Acute embolic CVA 2. 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  3. 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 NICM EF 15%  5. Anemia 6. Respiratory failure s/p trach    Plan/Discussion:    Doing very well with rehab. But failed repeat swallow study. Continue aggressive therapy. Continue current lasix dose.   INR therapeutic. Continue heparin. Hgb stable. No LDH today, will reorder for tomorrow. No evidence pump dysfunction or power surges.  Dr Gala Romney spoke with Drs. ONEOK  and it is felt that we should proceed with pump exchange next week at [facility]. To [facility] on Monday. Will continue heparin and coumadin for now. Plavix stopped.   VAD interrogated personally.    Marca Ancona MD 3:53 PM  Advanced Heart Failure Team Pager [phone] (M-F; 7a - 4p)  Please contact [facility] for night-coverage after hours (4p -7a ) and weekends on [email or web address]

## 2013-08-21 NOTE — Progress Notes (Signed)
ANTICOAGULATION CONSULT NOTE  Pharmacy Consult for heparin, warfarin Indication: LVAD thrombosis  Allergies  Allergen Reactions  . Ace Inhibitors Cough  . Lexapro [Escitalopram Oxalate] Other (See Comments)    somnolence    Patient Measurements: Weight: 222 lb 0.1 oz (100.7 kg)  Vital Signs: Temp: 98.3 F (36.8 C) (02/28 0553) Temp src: Oral (02/28 0553) Pulse Rate: 90 (02/28 0642)  Labs:  Recent Labs  08/19/13 0710 08/20/13 0458 08/20/13 0830 08/21/13 1255  HGB 8.8* 9.2* 9.0*  --   HCT 27.2* 29.4* 28.7*  --   PLT 320 322  --   --   LABPROT 25.7* 38.1* 26.0* 24.7*  INR 2.44* 4.09* 2.48* 2.32*  HEPARINUNFRC 0.46 0.48  --  0.50  CREATININE  --  1.03  --   --     The CrCl is unknown because both a height and weight (above a minimum accepted value) are required for this calculation.  Assessment: 53 yo M with LVAD admitted 07/17/2013 with elevated trop/NSTEMI.  Pharmacy consulted to dose heparin and coumadin for possible pump thrombosis  INR 2.32  therapeutic, heparin level at goal. Plan to transfer to [facility] on Monday for pump exchange.  Yesterday pt had event with arm bleeding from lab draw site.  Pressure held x 15 minutes;  No more bleeding reported.  Coag: LVAD, ACS, pump thrombosis - heparin started on admit> Heparin held for bleeding from mouth after intubation/nose improved -heparin stopped 2/16 after INR > 2 x48hr - surgery feels pt too high risk for PEG - panda clotted - exchanged 2/19 > heparin restarted 2/22.  LDH remains elevated, but slight downward trend. Plavix stopped.  PTA Warfarin: 5 qd 6 Fri but took 10mg  for 2-3 days pta for INR 1.2 week pta (took extra Coum Thu/Fri pta due to low INR)  Goal of Therapy:  INR 2-3 Monitor platelets by anticoagulation protocol: Yes Heparin level = 0.3-0.5  Plan:  Warfarin 6 mg po x 1. Continue Heparin at current rate Continue daily heparin level and CBC.  Herby Abraham, Pharm.D. [phone] 08/21/2013 2:44 PM

## 2013-08-21 NOTE — Progress Notes (Addendum)
Nursing Note: Noted difference in weight today and Dr.Swords in and made aware.wbb Pt is not a big man and previous weight more likely accurate.wbb

## 2013-08-22 ENCOUNTER — Inpatient Hospital Stay (HOSPITAL_COMMUNITY): Payer: Medicare Other | Admitting: *Deleted

## 2013-08-22 ENCOUNTER — Inpatient Hospital Stay (HOSPITAL_COMMUNITY): Payer: Medicare Other

## 2013-08-22 LAB — COMPREHENSIVE METABOLIC PANEL
ALT: 24 U/L (ref 0–53)
AST: 68 U/L — ABNORMAL HIGH (ref 0–37)
Albumin: 3.3 g/dL — ABNORMAL LOW (ref 3.5–5.2)
Alkaline Phosphatase: 92 U/L (ref 39–117)
BILIRUBIN TOTAL: 1 mg/dL (ref 0.3–1.2)
BUN: 18 mg/dL (ref 6–23)
CHLORIDE: 93 meq/L — AB (ref 96–112)
CO2: 27 mEq/L (ref 19–32)
CREATININE: 1.09 mg/dL (ref 0.50–1.35)
Calcium: 9.4 mg/dL (ref 8.4–10.5)
GFR calc Af Amer: 90 mL/min — ABNORMAL LOW (ref >90)
GFR calc non Af Amer: 77 mL/min — ABNORMAL LOW (ref >90)
GLUCOSE: 107 mg/dL — AB (ref 70–99)
POTASSIUM: 4 meq/L (ref 3.7–5.3)
Sodium: 134 mEq/L — ABNORMAL LOW (ref 137–147)
Total Protein: 8.3 g/dL (ref 6.0–8.3)

## 2013-08-22 LAB — GLUCOSE, CAPILLARY
GLUCOSE-CAPILLARY: 115 mg/dL — AB (ref 70–99)
Glucose-Capillary: 107 mg/dL — ABNORMAL HIGH (ref 70–99)
Glucose-Capillary: 114 mg/dL — ABNORMAL HIGH (ref 70–99)

## 2013-08-22 LAB — HEPARIN LEVEL (UNFRACTIONATED): Heparin Unfractionated: 0.59 IU/mL (ref 0.30–0.70)

## 2013-08-22 LAB — PROTIME-INR
INR: 2.32 — ABNORMAL HIGH (ref 0.00–1.49)
Prothrombin Time: 24.7 seconds — ABNORMAL HIGH (ref 11.6–15.2)

## 2013-08-22 LAB — CBC
HEMATOCRIT: 30.9 % — AB (ref 39.0–52.0)
Hemoglobin: 9.8 g/dL — ABNORMAL LOW (ref 13.0–17.0)
MCH: 27.7 pg (ref 26.0–34.0)
MCHC: 31.7 g/dL (ref 30.0–36.0)
MCV: 87.3 fL (ref 78.0–100.0)
Platelets: 368 10*3/uL (ref 150–400)
RBC: 3.54 MIL/uL — ABNORMAL LOW (ref 4.22–5.81)
RDW: 15.9 % — AB (ref 11.5–15.5)
WBC: 6.2 10*3/uL (ref 4.0–10.5)

## 2013-08-22 LAB — LACTATE DEHYDROGENASE: LDH: 1500 U/L — ABNORMAL HIGH (ref 94–250)

## 2013-08-22 MED ORDER — WARFARIN SODIUM 6 MG PO TABS
6.0000 mg | ORAL_TABLET | Freq: Once | ORAL | Status: AC
  Administered 2013-08-22: 6 mg via ORAL
  Filled 2013-08-22: qty 1

## 2013-08-22 NOTE — Progress Notes (Signed)
Subjective/Complaints: 53 y.o. 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 He was admitted for evaluation of new NSTEMI felt possibly to be related to pump thrombosis. Hosp course complicated by episodes of CP and hypotension on 1/24 pm hours requiring pressors. He . 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 Aspiration PNA treated with zosyn. TEE 1/28 without evidence of clot but with RV failure  Extubated on 07/23/13 but has difficulty handling secretions and reintubated on 07/25/13. He developed oropharyngeal bleeding and ENT evaluations did not reveal source of acute bleeding. Trach placed by Dr. Brooks Sailors on 07/28/13 and patient tolerating trach collar. He is NPO due to sever dysphagia but deemed too high risk for PEG placement by IR and CCS. Is tolerating panda tube feeds without difficulty. Trach downsized to CFS # 6 , PMSV trials attempted. Patient able to cough tracheally.  He developed hematuria with urinary retention and foley placed on 08/07/13. Has had complaints of bladder pain and foley d/c today. 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 Therapies ongoing and patient showing improvement in left hemiparesis. He continues with ideomotor/ oromotor apraxia, severe oropharyngeal dysphagia as well as aphasia   Objective:  no complaints. Answers questions by shaking head  Vital Signs: Pulse 83, temperature 98.5 F (36.9 C), temperature source Oral, resp. rate 18, weight 223 lb 8.7 oz (101.4 kg), SpO2 100.00%.  Chronically ill-appearing. Janina Mayo is in place. Chest with few rhonchi that clear with coughing. Cardiac exam: Unable to hear heart sounds. Machine sounds at the that are identifiable. Extremities no significant edema.  Assessment/Plan: 1.  Functional deficits secondary to Bilateral BG embolic infarct with Left HP, Aphasia vs dysarthria   Medical Problem List and Plan:  Embolic bilateral BG CVA's, left thalamus CVA, CHF  1. 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  2. Pain Management: Tylenol prn.  3. Mood: No signs of distress  4. Neuropsych: This patient is capable of making decisions on her own behalf.  5. LVAD: Cardiology to follow for management/support.   -weights, I's and O's being followed closely- discussed with cardiology yesterday. 6. Urinary retention/Hematuria:   on flomax. PVR/cath prn.  7. Severe dysphagia/oral apraxia: Continue panda tube.   50 cc/hr with fluid bolus.  8. Trach dependent: Tolerating CFS #6. Continue to suction as needed pulmonary toilet- not ready for #4 per CCM -trials of PMV with SLP, since pump exchange is planned would hold off on downsize   9.  Anemia with hemolysis ? r/t pump, monitor, elevated LDH 9.  Anemia will ask cardiology if they want another H and H elevated INR with post phlebotomy bleed  Lab Results  Component Value Date   HGB 9.7* 08/21/2013    LOS (Days) 11 A FACE TO FACE EVALUATION WAS PERFORMED  Yassmine Tamm HENRY 08/22/2013, 9:19 AM

## 2013-08-22 NOTE — Progress Notes (Signed)
ANTICOAGULATION CONSULT NOTE  Pharmacy Consult for heparin, warfarin Indication: LVAD thrombosis  Allergies  Allergen Reactions  . Ace Inhibitors Cough  . Lexapro [Escitalopram Oxalate] Other (See Comments)    somnolence    Patient Measurements: Weight: 223 lb 8.7 oz (101.4 kg)  Vital Signs: Temp: 98.5 F (36.9 C) (03/01 0459) Temp src: Oral (03/01 0459) Pulse Rate: 83 (03/01 0459)  Labs:  Recent Labs  08/20/13 0458 08/20/13 0830 08/21/13 1255 08/22/13 1321  HGB 9.2* 9.0* 9.7* 9.8*  HCT 29.4* 28.7* 30.6* 30.9*  PLT 322  --  377 368  LABPROT 38.1* 26.0* 24.7* 24.7*  INR 4.09* 2.48* 2.32* 2.32*  HEPARINUNFRC 0.48  --  0.50 0.59  CREATININE 1.03  --   --   --     The CrCl is unknown because both a height and weight (above a minimum accepted value) are required for this calculation.  Assessment: 53 yo M with LVAD admitted 07/17/2013 with elevated trop/NSTEMI.  Pharmacy consulted to dose heparin and coumadin for possible pump thrombosis  INR 2.32 (same as yesterday) HL 0.59,  therapeutic, heparin level at goal. Plan to transfer to [facility] on Monday for pump exchange.  Friday pt had event with arm bleeding from lab draw site.  Pressure held x 15 minutes;  No more bleeding reported.  Coag: LVAD, ACS, pump thrombosis - heparin started on admit> Heparin held for bleeding from mouth after intubation/nose improved -heparin stopped 2/16 after INR > 2 x48hr - surgery feels pt too high risk for PEG - panda clotted - exchanged 2/19 > heparin restarted 2/22.  LDH remains elevated, but slight downward trend. Plavix stopped.  PTA Warfarin: 5 qd 6 Fri but took 10mg  for 2-3 days pta for INR 1.2 week pta (took extra Coum Thu/Fri pta due to low INR)  Goal of Therapy:  INR 2-3 Monitor platelets by anticoagulation protocol: Yes Heparin level = 0.3-0.5  Plan:  Warfarin 6 mg po x 1. Continue Heparin at current rate Continue daily heparin level and CBC.  Herby Abraham,  Pharm.D. [phone] 08/22/2013 2:10 PM

## 2013-08-22 NOTE — Plan of Care (Signed)
Problem: RH Stairs Goal: LTG Patient will ambulate up and down stairs w/assist (PT) LTG: Patient will ambulate up and down # of stairs with assistance (PT)  Outcome: Not Met (add Reason) Patient ambulated up and down 5 steps. Patient refused to do steps on grad day.

## 2013-08-22 NOTE — Progress Notes (Signed)
Patient ID: [name], male   DOB: 03/22/1961, 53 y.o.   MRN: [id number] Advanced Heart Failure Rounding Note  Continues to progress nicely with Rehab. Labs just being drawn today.    INR 2.3 LDH [id number] > 252 619 [id number]>1144  VAD interrogated personally. Flow 5.5, Speed 9200 PI 5.7 Power 5.8  2 PI events today. No power surges  Filed Vitals:   08/22/13 0420 08/22/13 0459 08/22/13 0811 08/22/13 1211  Pulse: 89 83    Temp:  98.5 F (36.9 C)    TempSrc:  Oral    Resp: 18     Weight:  101.4 kg (223 lb 8.7 oz)    SpO2: 99% 100% 100% 99%    Intake/Output Summary (Last 24 hours) at 08/22/13 1318 Last data filed at 08/22/13 1100  Gross per 24 hour  Intake    300 ml  Output   2475 ml  Net  -2175 ml   Scheduled Meds: . allopurinol  100 mg Oral Daily  . antiseptic oral rinse  15 mL Mouth Rinse QID  . aspirin  81 mg Per NG tube Daily  . chlorhexidine  15 mL Mouth Rinse BID  . citalopram  10 mg Per Tube Daily  . feeding supplement (PRO-STAT SUGAR FREE 64)  30 mL Per Tube BID  . free water  150 mL Per Tube 4 times per day  . furosemide  40 mg Per Tube Daily  . glycopyrrolate  1 mg Per Tube QPC breakfast  . magnesium oxide  400 mg Per Tube Daily  . pantoprazole sodium  40 mg Per Tube Q1200  . sodium chloride  10-40 mL Intracatheter Q12H  . spironolactone  25 mg Per Tube Daily  . Warfarin - Pharmacist Dosing Inpatient   Does not apply q1800   Continuous Infusions: . sodium chloride 20 mL/hr at 08/19/13 0506  . feeding supplement (JEVITY 1.2 CAL) 50 mL/hr at 08/22/13 0300  . heparin 1,200 Units/hr (08/22/13 0005)   PRN Meds:.acetaminophen, bisacodyl, guaiFENesin-dextromethorphan, lidocaine, nitroGLYCERIN, ondansetron (ZOFRAN) IV, sodium chloride  LABS: Basic Metabolic Panel:  Recent Labs  65/78/4602/27/15 0458  NA 138  K 4.6  CL 99  CO2 29  GLUCOSE 114*  BUN 15  CREATININE 1.03  CALCIUM 9.3   Liver Function Tests:  Recent Labs  08/20/13 0458  AST 46*  ALT 18  ALKPHOS 85   BILITOT 0.7  PROT 7.4  ALBUMIN 2.9*    Recent Labs  08/20/13 0458  AMYLASE 58   CBC:  Recent Labs  08/20/13 0458 08/20/13 0830 08/21/13 1255  WBC 5.0  --  6.2  HGB 9.2* 9.0* 9.7*  HCT 29.4* 28.7* 30.6*  MCV 88.0  --  87.9  PLT 322  --  377   Cardiac Enzymes: No results found for this basename: CKTOTAL, CKMB, CKMBINDEX, TROPONINI,  in the last 72 hours RADIOLOGY: Dg Chest Portable 1 View  07/17/2013   CLINICAL DATA:  Left-sided chest pain for 3 days, weakness and dizziness.  EXAM: PORTABLE CHEST - 1 VIEW  COMPARISON:  08/03/2012  FINDINGS: Evidence of median sternotomy noted with LVAD in place. Left-sided defibrillator noted. Mild prominence of the cardiac silhouette persists. Tricuspid valvuloplasty reidentified. Trace left pleural fluid or thickening persists. No new pulmonary opacity.  IMPRESSION: No new acute abnormality. Stable appearance of trace left pleural fluid or thickening.   Electronically Signed   By: Christiana PellantGretchen  Green M.D.   On: 07/17/2013 10:12    PHYSICAL EXAM-  General: NAD; on trach collar; sitting  in [facility] watching TV Neck: JVP 6-7; trach collar; clear secretions Lungs: coarse throughout CV: LVAD pump hum normal.   Abdomen: Nontender no hepatosplenomegaly, no distention. + BS Neurologic: awake interactive.  Moving left leg,and L arm Extremities: No clubbing or cyanosis.  RUE PICC. Has arm sleeve    Assessment:   1. Acute embolic CVA 2. 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  3. 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 NICM EF 15%  5. Anemia 6. Respiratory failure s/p trach    Plan/Discussion:    Doing very well with rehab. But failed repeat swallow study. Continue aggressive therapy. Continue current lasix dose.   Await INR today. Continue heparin. Await hemoglobin and LDH today. No evidence pump dysfunction or power surges.  Dr Gala Romney spoke with Drs. [facility] and it is felt that we should proceed with  pump exchange next week at [facility]. To [facility] on Monday. Will continue heparin and coumadin for now. Plavix stopped.   VAD interrogated personally.    Marca Ancona MD 1:18 PM  Advanced Heart Failure Team Pager [phone] (M-F; 7a - 4p)  Please contact [facility] for night-coverage after hours (4p -7a ) and weekends on [email or web address]

## 2013-08-22 NOTE — Progress Notes (Signed)
Noted patient's secretions from his trach pinkish in color late afternoon. MD notified. No orders received at this time. Will monitor.

## 2013-08-22 NOTE — Progress Notes (Signed)
Occupational Therapy Note  Patient Details  Name: Carless Fabris MRN: [id number] Date of Birth: [birth year] Today's Date: 08/22/2013  Time:  1530-1630  (60 min) Pain:LUE pain 7/10 with AROM; 1/10 with rest Individual session    Engaged in BUE strength and  AROM.  Pt did UE AROM inj all ranges with no shoulder ranges greater than 45 degrees.  Did donning/doffing pants with min assist for balance in standing  Needed assist holding LLE in crossed leg position while donning pants.  . Addressed standing balance activities with knee bends, kicks, and lunges.  Pt had unsteadiness in more dynamic movements.  Oxygen dropped to 80 during short periods of time during session, but regained after  1 minute of rest.   Left pt in wc with  call bell,phone within reach.      Humberto Seals 08/22/2013, 4:01 PM

## 2013-08-22 NOTE — Progress Notes (Signed)
Physical Therapy Discharge Summary  Patient Details  Name: [name] MRN: [id number] Date of Birth: 1961/04/12  Today's Date: 08/22/2013 Time: 9371-6967 Time Calculation (min): 40 min  Skilled Intervention: Patient sitting on [facility] upon entering room. Patient able to perform hygiene and assist with clothes after toileting with steady assist. Patient transferred [facility] to bed with min assist. Patient changed to battery power for LVAD.  Patient transferred bed to wheelchair with close supervision. Patient ambulated 150 feet x 2 with RW and hand orthosis and +1 min assist for IV pole/oxygen. Patient performed transfer into and out of car with supervision. Patient returned to room and left sitting in wheelchair with call bell in reach.  Patient has met 3 of 4 long term goals due to improved activity tolerance, improved balance, increased strength and functional use of  left upper extremity and left lower extremity.  Patient to discharge at an ambulatory level [place].   Patient's care partner has not participated in caregiver education as patient is being discharged to [facility] for surgery.   Reasons goals not met: Early discharge to [facility] for LVAD surgery.  Recommendation:  Patient will benefit from ongoing skilled PT services in acute care setting to continue to advance safe functional mobility, address ongoing impairments in endurance, balance, and minimize fall risk.  Equipment: No equipment provided  Reasons for discharge: discharge from hospital to [facility] for LVAD surgery   Patient/family agrees with progress made and goals achieved: Yes  PT Discharge Precautions/Restrictions Precautions Precautions: Fall Precaution Comments: LVAD, PICC line, NG tube, trach on supplemental 02; Temp greater than or equal to 100.0 degrees fahrenheit PO. Doppler MAP less than 60 or greater than 90; Heart Rate less than 50 or greater than 120; Urine Output less than 240cc in 8 hours or less than 30 cc/hr; O2  saturation less than 90%; CVP greater than 18 or less than 6.; NO CHEST COMPRESSIONS Restrictions Weight Bearing Restrictions: No Vital Signs Oxygen Therapy SpO2: 100 % O2 Device: Trach collar O2 Flow Rate (L/min): 5 L/min FiO2 (%): 28 % Pain Pain Assessment Pain Assessment: No/denies pain Vision/Perception  Vision - History Baseline Vision: No visual deficits Vision - Assessment Eye Alignment: Within Functional Limits  Cognition Overall Cognitive Status: Within Functional Limits for tasks assessed Arousal/Alertness: Awake/alert Orientation Level: Oriented X4 Sensation Sensation Light Touch: Appears Intact Stereognosis: Not tested Hot/Cold: Not tested Proprioception: Not tested Coordination Gross Motor Movements are Fluid and Coordinated: Yes Fine Motor Movements are Fluid and Coordinated: No Coordination and Movement Description: patient able to oppose 3 fingers on left hand to thumb Motor  Motor Motor - Skilled Clinical Observations: weakness on left - UE greater than LE Motor - Discharge Observations: weakness on left - UE greater than LE  Mobility Bed Mobility Supine to Sit: 4: Min assist Sitting - Scoot to Edge of Bed: 5: Supervision Transfers Sit to Stand: 5: Supervision Stand Pivot Transfers: 5: Supervision Locomotion  Ambulation Ambulation/Gait Assistance: 4: Min assist Ambulation Distance (Feet): 150 Feet Gait Gait: Yes Gait Pattern: Impaired Gait Pattern: Trunk flexed;Wide base of support;Decreased trunk rotation;Step-through pattern;Left foot flat  Extremity Assessment  RUE Assessment RUE Assessment: Within Functional Limits LUE Strength LUE Overall Strength: Deficits LUE Overall Strength Comments: patient has active shoulder flexion about 45 degrees; elbow flexion against gravity; gross finger flexion and extension; gross wrist flexion/extension; able to oppose first 3 fingers to thumb RLE Assessment RLE Assessment: Within Functional Limits LLE  Strength LLE Overall Strength: Deficits LLE Overall Strength Comments: generalized  weakness; grossly 4-/5 throughtout on left  See FIM for current functional status  Sanjuana Letters 08/22/2013, 12:09 PM

## 2013-08-23 DIAGNOSIS — R740 Nonspecific elevation of levels of transaminase and lactic acid dehydrogenase [LDH]: Secondary | ICD-10-CM

## 2013-08-23 DIAGNOSIS — R7402 Elevation of levels of lactic acid dehydrogenase (LDH): Secondary | ICD-10-CM | POA: Diagnosis present

## 2013-08-23 DIAGNOSIS — R1314 Dysphagia, pharyngoesophageal phase: Secondary | ICD-10-CM | POA: Diagnosis present

## 2013-08-23 LAB — GLUCOSE, CAPILLARY: Glucose-Capillary: 101 mg/dL — ABNORMAL HIGH (ref 70–99)

## 2013-08-23 LAB — CBC
HEMATOCRIT: 30 % — AB (ref 39.0–52.0)
HEMOGLOBIN: 9.5 g/dL — AB (ref 13.0–17.0)
MCH: 27.5 pg (ref 26.0–34.0)
MCHC: 31.7 g/dL (ref 30.0–36.0)
MCV: 86.7 fL (ref 78.0–100.0)
Platelets: 333 10*3/uL (ref 150–400)
RBC: 3.46 MIL/uL — ABNORMAL LOW (ref 4.22–5.81)
RDW: 15.9 % — AB (ref 11.5–15.5)
WBC: 5.4 10*3/uL (ref 4.0–10.5)

## 2013-08-23 LAB — HEPARIN LEVEL (UNFRACTIONATED): Heparin Unfractionated: 0.65 IU/mL (ref 0.30–0.70)

## 2013-08-23 LAB — PROTIME-INR
INR: 2.62 — ABNORMAL HIGH (ref 0.00–1.49)
Prothrombin Time: 27.1 seconds — ABNORMAL HIGH (ref 11.6–15.2)

## 2013-08-23 LAB — LACTATE DEHYDROGENASE: LDH: 1362 U/L — ABNORMAL HIGH (ref 94–250)

## 2013-08-23 MED ORDER — SPIRONOLACTONE 25 MG PO TABS: 25.0000 mg | ORAL_TABLET | Freq: Every day | ORAL | Status: DC

## 2013-08-23 MED ORDER — JEVITY 1.2 CAL PO LIQD: ORAL | Status: DC

## 2013-08-23 MED ORDER — FUROSEMIDE 40 MG PO TABS: 40.0000 mg | ORAL_TABLET | Freq: Every day | ORAL | Status: DC

## 2013-08-23 MED ORDER — ACETAMINOPHEN 325 MG PO TABS: 650.0000 mg | ORAL_TABLET | ORAL | Status: DC | PRN

## 2013-08-23 MED ORDER — CITALOPRAM HYDROBROMIDE 10 MG PO TABS: 10.0000 mg | ORAL_TABLET | Freq: Every day | ORAL | Status: DC

## 2013-08-23 MED ORDER — GUAIFENESIN-DM 100-10 MG/5ML PO SYRP: 5.0000 mL | ORAL_SOLUTION | Freq: Four times a day (QID) | ORAL | Status: DC | PRN

## 2013-08-23 MED ORDER — WARFARIN SODIUM 5 MG PO TABS: 5.0000 mg | ORAL_TABLET | Freq: Every day | ORAL | Status: DC

## 2013-08-23 MED ORDER — PRO-STAT SUGAR FREE PO LIQD: 30.0000 mL | Freq: Two times a day (BID) | ORAL | Status: DC

## 2013-08-23 MED ORDER — MAGNESIUM OXIDE 400 (241.3 MG) MG PO TABS: 400.0000 mg | ORAL_TABLET | Freq: Every day | ORAL | Status: DC

## 2013-08-23 MED ORDER — SODIUM CHLORIDE 0.9 % IJ SOLN: 10.0000 mL | Freq: Two times a day (BID) | INTRAMUSCULAR | Status: DC

## 2013-08-23 MED ORDER — GLYCOPYRROLATE 1 MG PO TABS: 1.0000 mg | ORAL_TABLET | Freq: Every day | ORAL | Status: DC

## 2013-08-23 MED ORDER — HEPARIN (PORCINE) IN NACL 100-0.45 UNIT/ML-% IJ SOLN: 1200.0000 [IU]/h | INTRAMUSCULAR | Status: DC

## 2013-08-23 MED ORDER — FREE WATER: 150.0000 mL | Freq: Four times a day (QID) | Status: DC

## 2013-08-23 MED ORDER — ASPIRIN 81 MG PO CHEW: 81.0000 mg | CHEWABLE_TABLET | Freq: Every day | ORAL | Status: DC

## 2013-08-23 MED ORDER — ALLOPURINOL 100 MG PO TABS: 100.0000 mg | ORAL_TABLET | Freq: Every day | ORAL | Status: DC

## 2013-08-23 NOTE — Progress Notes (Signed)
Social Work Patient ID: [name], male   DOB: 03/14/1961, 53 y.o.   MRN: [id number] Met with pt to inform gathering information for transfer today to [facility].  Spoke with Dr Haroldine Laws who has given worker number to contact regarding bed control and Person to contact Roselind Rily. Will contact regarding bed and work on transfer today to [facility].

## 2013-08-23 NOTE — Progress Notes (Signed)
Social Work Discharge Note Discharge Note  The overall goal for the admission was met for:   Discharge location: [facility]  Length of Stay: Yes-12 DAYS  Discharge activity level: Yes-MIN/MOD LEVEL  Home/community participation: Yes  Services provided included: MD, RD, PT, OT, SLP, RN, CM, Pharmacy, Neuropsych and SW  Financial Services: Medicare and Medicaid  Follow-up services arranged: Other: TRANSFER TO [facility] FOR SURGICAL PROCEDURE  Comments (or additional information):  Patient/Family verbalized understanding of follow-up arrangements: Yes  Individual responsible for coordination of the follow-up plan: TAMARA-GIRLFRIEND  Confirmed correct DME delivered: Elease Hashimoto 08/23/2013    Elease Hashimoto

## 2013-08-23 NOTE — Plan of Care (Signed)
Problem: Consults Goal: RH STROKE PATIENT EDUCATION See Patient Education module for education specifics  Outcome: Completed/Met Date Met:  08/23/13 Pt and family reported understanding of high cholesterol, stroke signs/symptons, antihypertensives, diabetes.

## 2013-08-23 NOTE — Progress Notes (Signed)
PCCM Follow Up Note  BRIEF PATIENT DESCRIPTION:  53 yo with non-ischemic cardiomyopathy / LVAD admitted to [facility] 1/24 with NSTEMI / pump thrombosis. Course was complicated by CVA and respiratory failure requiring tracheostomy. Tube was changed to cuffless # 6 on 2/13. Transferred to rehab on 2/18.  SUBJECTIVE: Still has respiratory secretions and makes difficult to use speech valve.  No difficulty with breathing otherwise.  OBJECTIVE: Pulse 84  Temp(Src) 98.4 F (36.9 C) (Oral)  Resp 18  Wt 220 lb 10.9 oz (100.1 kg)  SpO2 100%  General: No distress HEENT: Trach site clean, clear secretions, NG tube in place Cardiac: regular Chest: scattered rhonchi Abd: soft, non tender Ext: no edema Neuro: Follows commands  CBC Recent Labs     08/21/13  1255  08/22/13  1321  08/23/13  0635  WBC  6.2  6.2  5.4  HGB  9.7*  9.8*  9.5*  HCT  30.6*  30.9*  30.0*  PLT  377  368  333    Coag's Recent Labs     08/21/13  1255  08/22/13  1321  08/23/13  0635  INR  2.32*  2.32*  2.62*    BMET Recent Labs     08/22/13  1321  NA  134*  K  4.0  CL  93*  CO2  27  BUN  18  CREATININE  1.09  GLUCOSE  107*    Electrolytes Recent Labs     08/22/13  1321  CALCIUM  9.4   Liver Enzymes Recent Labs     08/22/13  1321  AST  68*  ALT  24  ALKPHOS  92  BILITOT  1.0  ALBUMIN  3.3*   Glucose Recent Labs     08/21/13  1618  08/21/13  2147  08/22/13  0601  08/22/13  1441  08/22/13  2154  08/23/13  0601  GLUCAP  110*  105*  115*  107*  114*  101*    Imaging No results found.  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  Respiratory secretions remain an issue. Non ischemic heart failure. Dysphagia  PLAN:  Continue trach collar F/u with speech therapy Continue robinul Patient transferring to [facility] for further management of LVAD  Coralyn Helling, MD [facility] Pulmonary/Critical Care 08/23/2013, 11:11 AM Pager:  [phone] After 3pm call: [phone]

## 2013-08-23 NOTE — Discharge Summary (Signed)
Physician Discharge Summary  Patient ID: [name] Bisher MRN: [id number] DOB/AGE: 51/08/1960 51 y.o.  Admit date: 08/11/2013 Discharge date: 08/23/2013  Discharge Diagnoses:  Principal Problem:   CVA (cerebral infarction) Active Problems:   SYSTOLIC HEART FAILURE, CHRONIC   LV (left ventricular) mural thrombus   LVAD (left ventricular assist device) present   Cough   Tracheostomy status   Dysphagia, pharyngoesophageal phase   Elevated LDH   Discharged Condition: Stable.   Dg Chest Port 1 View  08/19/2013   CLINICAL DATA:  Stroke, cough, recent tracheostomy  EXAM: PORTABLE CHEST - 1 VIEW  COMPARISON:  08/04/2013  FINDINGS: Stable tracheostomy tube position. Feeding tube is unchanged in position. One lead cardiac pacemaker again noted. Stable left ventricular assist device position. No pulmonary edema. Again noted left basilar atelectasis or infiltrate. Right PICC line is unchanged in position. Right lung is clear.  IMPRESSION: Stable support apparatus. No pulmonary edema. Again noted left basilar atelectasis or infiltrate.   Electronically Signed   By: Natasha MeadLiviu  Pop M.D.   On: 08/19/2013 09:36   Dg Abd Portable 1v  08/17/2013   CLINICAL DATA:  Abdominal distention and discomfort.  EXAM: PORTABLE ABDOMEN - 1 VIEW  COMPARISON:  07/30/2013 and prior studies  FINDINGS: The bowel gas pattern is normal.  No dilated bowel loops are present.  The small bore feeding tube is present with tip overlying the proximal duodenum.  Left ventricular assist device again noted.  IMPRESSION: Unremarkable bowel gas pattern.  No acute abnormalities.   Electronically Signed   By: Laveda AbbeJeff  Hu M.D.   On: 08/17/2013 23:52    Labs:  Basic Metabolic Panel:  Recent Labs Lab 08/18/13 0500 08/20/13 0458 08/22/13 1321  NA 138 138 134*  K 4.0 4.6 4.0  CL 97 99 93*  CO2 29 29 27   GLUCOSE 115* 114* 107*  BUN 21 15 18   CREATININE 0.95 1.03 1.09  CALCIUM 9.0 9.3 9.4    CBC:  Recent Labs Lab 08/21/13 1255  08/22/13 1321 08/23/13 0635  WBC 6.2 6.2 5.4  HGB 9.7* 9.8* 9.5*  HCT 30.6* 30.9* 30.0*  MCV 87.9 87.3 86.7  PLT 377 368 333    CBG:  Recent Labs Lab 08/21/13 2147 08/22/13 0601 08/22/13 1441 08/22/13 2154 08/23/13 0601  GLUCAP 105* 115* 107* 114* 101*   Results for Eric LovelyBUTLER, Ekin (MRN [id number]) as of 08/23/2013 10:50  Ref. Range 08/16/2013 06:05 08/18/2013 05:00 08/20/2013 04:58 08/22/2013 13:21 08/23/2013 06:35  LDH Latest Range: 94-250 U/L 1084 (H) 1234 (H) 1144 (H) 1500 (H) 1362 (H)   Results for Eric LovelyBUTLER, Kharee (MRN [id number]) as of 08/23/2013 10:50  Ref. Range 08/20/2013 04:58 08/20/2013 08:30 08/21/2013 12:55 08/22/2013 13:21 08/23/2013 06:35  Heparin Unfractionated Latest Range: 0.30-0.70 IU/mL 0.48  0.50 0.59 0.65  Prothrombin Time Latest Range: 11.6-15.2 seconds 38.1 (H) 26.0 (H) 24.7 (H) 24.7 (H) 27.1 (H)  INR Latest Range: 0.00-1.49  4.09 (H) 2.48 (H) 2.32 (H) 2.32 (H) 2.62 (H)  Platelet Function  P2Y12 Latest Range: 194-418 PRU   96 (L)       Brief HPI:   [name] Gregory is a 53 y.o. 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 He is s/p [facility], as bridge to transplant. He was admitted for evaluation of new NSTEMI felt possibly to be related to pump thrombosis. Hospital course complicated by episodes of CP and hypotension on 07/17/13 in evening hours requiring pressors. He  developed acute onset of left sided weakness  with aphasia due to acute right MCA embolic CVA per cerebral arteriogram. TEE on 07/21/13 without evidence of clot but with RV failure. He was extubated on 07/23/13 but has difficulty handling secretions and reintubated on 07/25/13.  He developed oropharyngeal bleeding and ENT evaluations did not reveal source of acute bleeding. Trach placed by Dr. Brooks Sailors on 07/28/13 and patient tolerating  downsize to CTS #6. Marland Kitchen He is NPO due to sever dysphagia but deemed too high risk for PEG placement by IR and CCS. Is tolerating panda tube feeds without  difficulty. He developed hematuria with urinary retention which was treated with foley. This was d/c on 08/11/13 prior to admission. His INR is therapeutic and LDH elevated (Suggestive of ongoing low-grade hemolysis due to persistent pump thrombosis) being monitored by Cardiology. Patient with improvement in left hemiparesis but continues with  ideomotor/ oromotor apraxia, severe oropharyngeal dysphagia as well as aphasia. CIR was recommended by Rehab team and patient admitted on 08/11/13.    Hospital Course: Matthe Sloane was admitted to rehab 08/11/2013 for inpatient therapies to consist of PT, ST and OT at least three hours five days a week. Past admission physiatrist, therapy team and rehab RN have worked together to provide customized collaborative inpatient rehab. He was maintained on coumadin and routine labs were ordered by LVAD protocol. Heparin was added on 08/15/13 due to continued elevation in LDH.  He has tolerated CFS #6 but continues to productive cough with secretions via trach. These are intermittently blood tinged. As secretions were getting tenacious and difficult to mobilize, robinul was decreased to once a day. Pulmonary was contacted for input on downsizing trach but recommended keeping current size till secretions improve. He is able to cough orally now but continues to requires deep suctioning occasionally. He remains NPO with panda tube feeds ongoing. He has required  panda replacement under fluroscopy twice due to displacement.   Voiding was monitored with post void checks and no retention noted. Urine culture done on 08/19/13 showed no growth. Dr. Bensimhon/Heart failure team has been following daily for input on cardiac issues. Daily weights were monitored and has increased to 100 kg over the past 2- 3 days.  No respiratory symptoms or signs of overload noted. He continues on lasix 40 mg daily. His LDH has continued to be elevated despite addition of Heparin.  Repeat  2D echo with EF 15  % with normal wall thickness, diffuse hypokinesis. There is akinesis of the anteroseptal, apical myocardium and mid-distal inferior myocardium. Moderate MVR and TVR noted.  Patient  has had improvement in left hemiparesis with LUE/LLE motor strength at 3-/5. Edema LUE has resolved.  He is tolerating PMSV and able to speak single one to three word sentences in a whisper. He is able to maintain adequate oxygenation with PMSV and is to continue use this with supervision of ST. Repeat FEES on 08/18/13 showed moderate pharyngeal phase dysphagia with delay in swallow and trace aspiration with all tested consistencies. Patient noted to intermittently aspirate secretions. Patient to remain NPO for now.  Due to concerns of ongoing hemolysis and need for LVAD exchange Plavix was discontinued on  08/20/13.  H/H has been stable.  Patient is to be discharged to [facility] today for pump exchange this week.     Rehab course: During patient's stay in rehab weekly team conferences were held to monitor patient's progress, set goals and discuss barriers to discharge. Patient has had improvement in activity tolerance, balance, postural control, as well as ability to  compensate for deficits. He is has had improvement in functional use LUE  and LLE as well as improved awareness. He requires min assist for ADL tasks. He requires min assist for bed mobility, supervision for stand pivot transfers and is able to ambulate 150 feet X 2 with use of RW with hand orthosis and min assist. Speech therapy has been working with patient on PMSV tolerance as well as phonation and dysphagia. Trial of ice chips are ongoing past oral care.    Disposition: 62-Rehab Facility   Diet: NPO  Special Instructions: 1. Continue oral care qid. Suction frequently. Routine trach care.  2. HOB > 30 degrees at all time.  3.  Routine LVAD care.     Medication List    STOP taking these medications       amLODipine 10 MG tablet  Commonly known as:   NORVASC     clopidogrel 75 MG tablet  Commonly known as:  PLAVIX     hydrALAZINE 20 MG/ML injection  Commonly known as:  APRESOLINE      TAKE these medications       acetaminophen 325 MG tablet  Commonly known as:  TYLENOL  Place 2 tablets (650 mg total) into feeding tube every 4 (four) hours as needed for headache or mild pain.     allopurinol 100 MG tablet  Commonly known as:  ZYLOPRIM  Place 1 tablet (100 mg total) into feeding tube daily. For gout     antiseptic oral rinse Liqd  15 mLs by Mouth Rinse route QID.     aspirin 81 MG chewable tablet  Place 1 tablet (81 mg total) into feeding tube daily.     chlorhexidine 0.12 % solution  Commonly known as:  PERIDEX  15 mLs by Mouth Rinse route 2 (two) times daily.     citalopram 10 MG tablet  Commonly known as:  CELEXA  Place 1 tablet (10 mg total) into feeding tube daily.     feeding supplement (JEVITY 1.2 CAL) Liqd  At 50 cc/hr     feeding supplement (PRO-STAT SUGAR FREE 64) Liqd  Place 30 mLs into feeding tube 2 (two) times daily.     free water Soln  Place 150 mLs into feeding tube every 6 (six) hours.     furosemide 40 MG tablet  Commonly known as:  LASIX  Place 1 tablet (40 mg total) into feeding tube daily.     glycopyrrolate 1 MG tablet  Commonly known as:  ROBINUL  Place 1 tablet (1 mg total) into feeding tube daily.     guaiFENesin-dextromethorphan 100-10 MG/5ML syrup  Commonly known as:  ROBITUSSIN DM  Place 5-10 mLs into feeding tube every 6 (six) hours as needed for cough.     heparin 100-0.45 UNIT/ML-% infusion  Inject 1,200 Units/hr into the vein continuous.     magnesium oxide 400 (241.3 MG) MG tablet  Commonly known as:  MAG-OX  Place 1 tablet (400 mg total) into feeding tube daily.     ondansetron 4 MG/2ML Soln injection  Commonly known as:  ZOFRAN  Inject 2 mLs (4 mg total) into the vein every 6 (six) hours as needed for nausea.     pantoprazole sodium 40 mg/20 mL Pack  Commonly  known as:  PROTONIX  Place 20 mLs (40 mg total) into feeding tube daily at 12 noon.     sodium chloride 0.9 % injection  10-40 mLs by Intracatheter route every 12 (twelve) hours.  spironolactone 25 MG tablet  Commonly known as:  ALDACTONE  Place 1 tablet (25 mg total) into feeding tube daily.     tamsulosin 0.4 MG Caps capsule  Commonly known as:  FLOMAX  Take 1 capsule (0.4 mg total) by mouth daily.     warfarin 5 MG tablet  Commonly known as:  COUMADIN  Place 1 tablet (5 mg total) into feeding tube daily.         SignedJacquelynn Cree 08/23/2013, 11:53 AM

## 2013-08-23 NOTE — Progress Notes (Signed)
Patient ID: [name], male   DOB: 1961-06-24, 53 y.o.   MRN: [id number] Advanced Heart Failure Rounding Note  Continues to progress nicely with Rehab. Able to walk. Moving left arm better. Handling secretions better. Still struggling with PM valve.   Heparin restarted last week due to rising LDH. Plavix was stopped 2/27 and LDH bumped up now coming down again.   Stable on lasix 40 daily. Denies SOB.  INR 2.3 LDH 4562>5638 > 1084>1234>1144> 1500> 1362  VAD interrogated personally. Flow 4.4, Speed 9200 PI 6.3 Power 5.2  Occasional PI events today. No power surges  Filed Vitals:   08/22/13 2045 08/22/13 2300 08/23/13 0300 08/23/13 0455  Pulse: 88 93 91 90  Temp:    98.4 F (36.9 C)  TempSrc:    Oral  Resp: 20 19 19 19   Weight:    100.1 kg (220 lb 10.9 oz)  SpO2: 100% 97% 98% 99%    Intake/Output Summary (Last 24 hours) at 08/23/13 0901 Last data filed at 08/23/13 9373  Gross per 24 hour  Intake    300 ml  Output   1150 ml  Net   -850 ml   Scheduled Meds: . allopurinol  100 mg Oral Daily  . antiseptic oral rinse  15 mL Mouth Rinse QID  . aspirin  81 mg Per NG tube Daily  . chlorhexidine  15 mL Mouth Rinse BID  . citalopram  10 mg Per Tube Daily  . feeding supplement (PRO-STAT SUGAR FREE 64)  30 mL Per Tube BID  . free water  150 mL Per Tube 4 times per day  . furosemide  40 mg Per Tube Daily  . glycopyrrolate  1 mg Per Tube QPC breakfast  . magnesium oxide  400 mg Per Tube Daily  . pantoprazole sodium  40 mg Per Tube Q1200  . sodium chloride  10-40 mL Intracatheter Q12H  . spironolactone  25 mg Per Tube Daily  . Warfarin - Pharmacist Dosing Inpatient   Does not apply q1800   Continuous Infusions: . sodium chloride 10 mL/hr (08/23/13 0025)  . feeding supplement (JEVITY 1.2 CAL) 1,000 mL (08/23/13 0020)  . heparin 1,200 Units/hr (08/23/13 0015)   PRN Meds:.acetaminophen, bisacodyl, guaiFENesin-dextromethorphan, lidocaine, nitroGLYCERIN, ondansetron (ZOFRAN) IV, sodium  chloride  LABS: Basic Metabolic Panel:  Recent Labs  42/87/68 1321  NA 134*  K 4.0  CL 93*  CO2 27  GLUCOSE 107*  BUN 18  CREATININE 1.09  CALCIUM 9.4   Liver Function Tests:  Recent Labs  08/22/13 1321  AST 68*  ALT 24  ALKPHOS 92  BILITOT 1.0  PROT 8.3  ALBUMIN 3.3*   No results found for this basename: LIPASE, AMYLASE,  in the last 72 hours CBC:  Recent Labs  08/22/13 1321 08/23/13 0635  WBC 6.2 5.4  HGB 9.8* 9.5*  HCT 30.9* 30.0*  MCV 87.3 86.7  PLT 368 333   Cardiac Enzymes: No results found for this basename: CKTOTAL, CKMB, CKMBINDEX, TROPONINI,  in the last 72 hours RADIOLOGY: Dg Chest Portable 1 View  07/17/2013   CLINICAL DATA:  Left-sided chest pain for 3 days, weakness and dizziness.  EXAM: PORTABLE CHEST - 1 VIEW  COMPARISON:  08/03/2012  FINDINGS: Evidence of median sternotomy noted with LVAD in place. Left-sided defibrillator noted. Mild prominence of the cardiac silhouette persists. Tricuspid valvuloplasty reidentified. Trace left pleural fluid or thickening persists. No new pulmonary opacity.  IMPRESSION: No new acute abnormality. Stable appearance of trace left pleural fluid  or thickening.   Electronically Signed   By: Christiana PellantGretchen  Green M.D.   On: 07/17/2013 10:12    PHYSICAL EXAM-  General: NAD; on trach collar; sitting in WC watching TV Neck: JVP 6-7; trach collar; clear secretions Lungs: coarse throughout CV: LVAD pump hum normal.   Abdomen: Nontender no hepatosplenomegaly, no distention. + BS Neurologic: awake interactive.  Moving left leg,and L arm Extremities: No clubbing or cyanosis.  RUE PICC. Has arm sleeve    Assessment:    1. Acute embolic CVA 2. 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 (Had CVA on way to cath lab so no coronary angiography) 3. 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 NICM EF 15%  5. Anemia 6. Respiratory failure s/p trach    Plan/Discussion:    Doing very well with rehab. But  failed repeat swallow study. Continue aggressive therapy.  LDH remains high. Continue heparin/coumadin. (will likely need to stop coumadin soon for pump exchange). Transfer to [facility] today for pump exchange evaluation. No evidence pump dysfunction or power surges. LV decompressing well by echo 2/27  VAD interrogated personally. No issues.   Arvilla Meresaniel Jarron Curley MD 9:01 AM  Advanced Heart Failure Team Pager [phone] (M-F; 7a - 4p)  Please contact [facility] for night-coverage after hours (4p -7a ) and weekends on [email or web address]

## 2013-08-23 NOTE — Progress Notes (Signed)
Occupational Therapy Discharge Summary  Patient Details  Name: [name] MRN: [id number] Date of Birth: Apr 10, 1961  Today's Date: 08/23/2013  Patient has met 8 of 8 long term goals due to improved activity tolerance, improved balance, ability to compensate for deficits and functional use of  LEFT upper and LEFT lower extremity.  Patient to discharge at [facility] Assist level.  Patient's care partner has not participated in caregiver education as patient is being discharged to [facility] for surgery.    Reasons goals not met: N/A  Recommendation:  Patient will benefit from ongoing skilled OT services in acute care setting to continue to advance functional skills in the area of BADL, Reduce care partner burden and LUE functional use.  Equipment: No equipment provided  Reasons for discharge: discharge to [facility] for LVAD surgery  Patient/family agrees with progress made and goals achieved: Yes  OT Discharge Precautions/Restrictions  Precautions  Precautions: Fall  Precaution Comments: LVAD, PICC line, NG tube, trach on supplemental 02; Temp greater than or equal to 100.0 degrees fahrenheit PO. Doppler MAP less than 60 or greater than 90; Heart Rate less than 50 or greater than 120; Urine Output less than 240cc in 8 hours or less than 30 cc/hr; O2 saturation less than 90%; CVP greater than 18 or less than 6.; NO CHEST COMPRESSIONS  Restrictions  Weight Bearing Restrictions: No  Vital Signs Therapy Vitals Pulse Rate: 84 Resp: 18 Patient Position, if appropriate: Lying Oxygen Therapy SpO2: 100 % O2 Device: Trach collar O2 Flow Rate (L/min): 5 L/min FiO2 (%): 28 % Pain  Pain Assessment  Pain Assessment: No/denies pain  Vision/Perception  Vision - History  Baseline Vision: No visual deficits  Vision - Assessment  Eye Alignment: Within Functional Limits  Cognition  Overall Cognitive Status: Within Functional Limits for tasks assessed  Arousal/Alertness: Awake/alert  Orientation  Level: Oriented X4  Sensation  Sensation  Light Touch: Appears Intact  Stereognosis: Not tested  Hot/Cold: Not tested  Proprioception: Not tested  Coordination  Gross Motor Movements are Fluid and Coordinated: Yes  Fine Motor Movements are Fluid and Coordinated: No  Coordination and Movement Description: patient able to oppose 3 fingers on left hand to thumb  Motor  Motor  Motor - Skilled Clinical Observations: weakness on left - UE greater than LE  Motor - Discharge Observations: weakness on left - UE greater than LE  Mobility  Bed Mobility  Supine to Sit: 4: Min assist  Sitting - Scoot to Edge of Bed: 5: Supervision  Transfers  Sit to Stand: 5: Supervision  Stand Pivot Transfers: 5: Supervision  Locomotion  Ambulation  Ambulation/Gait Assistance: 4: Min assist  Ambulation Distance (Feet): 150 Feet  Gait  Gait: Yes  Gait Pattern: Impaired  Gait Pattern: Trunk flexed;Wide base of support;Decreased trunk rotation;Step-through pattern;Left foot flat  Extremity Assessment  RUE Assessment  RUE Assessment: Within Functional Limits  LUE Strength  LUE Overall Strength: Deficits  LUE Overall Strength Comments: patient has active shoulder flexion about 45 degrees; elbow flexion against gravity; gross finger flexion and extension; gross wrist flexion/extension; able to oppose first 3 fingers to thumb  RLE Assessment  RLE Assessment: Within Functional Limits  LLE Strength  LLE Overall Strength: Deficits  LLE Overall Strength Comments: generalized weakness; grossly 4-/5 throughout on left  See FIM for current functional status  Daylani Deblois, [facility] 08/23/2013, 10:10 AM

## 2013-08-23 NOTE — Progress Notes (Signed)
Subjective/Complaints: 53 y.o. 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 He is s/p LVAD placement at [facility] March 2013, as bridge to transplant. He was admitted for evaluation of new NSTEMI felt possibly to be related to pump thrombosis. Hosp course complicated by episodes of CP and hypotension on 1/24 pm hours requiring pressors. He . 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 Aspiration PNA treated with zosyn. TEE 1/28 without evidence of clot but with RV failure  Extubated on 07/23/13 but has difficulty handling secretions and reintubated on 07/25/13. He developed oropharyngeal bleeding and ENT evaluations did not reveal source of acute bleeding. Trach placed by Dr. Harland Dingwall on 07/28/13 and patient tolerating trach collar. He is NPO due to sever dysphagia but deemed too high risk for PEG placement by IR and CCS. Is tolerating panda tube feeds without difficulty. Trach downsized to CFS # 6 yesterday and PMSV trials attempted. Patient able to cough tracheally.  He developed hematuria with urinary retention and foley placed on 08/07/13. Has had complaints of bladder pain and foley d/c today. 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 Therapies ongoing and patient showing improvement in left hemiparesis. He continues with ideomotor/ oromotor apraxia, severe oropharyngeal dysphagia as well as aphasia   Objective: RN reported significant amt bleeding from Left forearm phlebotomy site site, removed and pressure drsg applied with resolution Need for cont trach for now  Denies SOB Coughing up moderate clear secretions Appreciate cardiology note Review of Systems - cannot obtain secondary to reduced speech   Vital Signs: Pulse 90, temperature 98.4 F (36.9 C), temperature source Oral, resp. rate 19, weight 100.1 kg (220 lb 10.9  oz), SpO2 99.00%. No results found. Results for orders placed during the hospital encounter of 08/11/13 (from the past 72 hour(s))  PROTIME-INR     Status: Abnormal   Collection Time    08/20/13  8:30 AM      Result Value Ref Range   Prothrombin Time 26.0 (*) 11.6 - 15.2 seconds   INR 2.48 (*) 0.00 - 1.49  HEMOGLOBIN AND HEMATOCRIT, BLOOD     Status: Abnormal   Collection Time    08/20/13  8:30 AM      Result Value Ref Range   Hemoglobin 9.0 (*) 13.0 - 17.0 g/dL   HCT 28.7 (*) 39.0 - 52.0 %  GLUCOSE, CAPILLARY     Status: Abnormal   Collection Time    08/20/13  2:27 PM      Result Value Ref Range   Glucose-Capillary 109 (*) 70 - 99 mg/dL   Comment 1 Notify RN    GLUCOSE, CAPILLARY     Status: Abnormal   Collection Time    08/20/13  9:58 PM      Result Value Ref Range   Glucose-Capillary 116 (*) 70 - 99 mg/dL  GLUCOSE, CAPILLARY     Status: Abnormal   Collection Time    08/21/13  5:51 AM      Result Value Ref Range   Glucose-Capillary 127 (*) 70 - 99 mg/dL  PLATELET INHIBITION P2Y12     Status: Abnormal   Collection Time    08/21/13 12:55 PM      Result Value Ref Range   Platelet Function  P2Y12 96 (*) 194 - 418 PRU   Comment:  The literature has shown a direct     correlation of PRU values over     230 with higher risks of     thrombotic events.  Lower PRU     values are associated with     platelet inhibition.  PROTIME-INR     Status: Abnormal   Collection Time    08/21/13 12:55 PM      Result Value Ref Range   Prothrombin Time 24.7 (*) 11.6 - 15.2 seconds   INR 2.32 (*) 0.00 - 1.49  CBC     Status: Abnormal   Collection Time    08/21/13 12:55 PM      Result Value Ref Range   WBC 6.2  4.0 - 10.5 K/uL   RBC 3.48 (*) 4.22 - 5.81 MIL/uL   Hemoglobin 9.7 (*) 13.0 - 17.0 g/dL   HCT 16.6 (*) 41.8 - 30.4 %   MCV 87.9  78.0 - 100.0 fL   MCH 27.9  26.0 - 34.0 pg   MCHC 31.7  30.0 - 36.0 g/dL   RDW 30.6 (*) 31.7 - 92.1 %   Platelets 377  150 - 400 K/uL   HEPARIN LEVEL (UNFRACTIONATED)     Status: None   Collection Time    08/21/13 12:55 PM      Result Value Ref Range   Heparin Unfractionated 0.50  0.30 - 0.70 IU/mL   Comment:            IF HEPARIN RESULTS ARE BELOW     EXPECTED VALUES, AND PATIENT     DOSAGE HAS BEEN CONFIRMED,     SUGGEST FOLLOW UP TESTING     OF ANTITHROMBIN III LEVELS.  GLUCOSE, CAPILLARY     Status: Abnormal   Collection Time    08/21/13  4:18 PM      Result Value Ref Range   Glucose-Capillary 110 (*) 70 - 99 mg/dL  GLUCOSE, CAPILLARY     Status: Abnormal   Collection Time    08/21/13  9:47 PM      Result Value Ref Range   Glucose-Capillary 105 (*) 70 - 99 mg/dL  GLUCOSE, CAPILLARY     Status: Abnormal   Collection Time    08/22/13  6:01 AM      Result Value Ref Range   Glucose-Capillary 115 (*) 70 - 99 mg/dL  LACTATE DEHYDROGENASE     Status: Abnormal   Collection Time    08/22/13  1:21 PM      Result Value Ref Range   LDH 1500 (*) 94 - 250 U/L  PROTIME-INR     Status: Abnormal   Collection Time    08/22/13  1:21 PM      Result Value Ref Range   Prothrombin Time 24.7 (*) 11.6 - 15.2 seconds   INR 2.32 (*) 0.00 - 1.49  CBC     Status: Abnormal   Collection Time    08/22/13  1:21 PM      Result Value Ref Range   WBC 6.2  4.0 - 10.5 K/uL   RBC 3.54 (*) 4.22 - 5.81 MIL/uL   Hemoglobin 9.8 (*) 13.0 - 17.0 g/dL   HCT 30.3 (*) 97.4 - 91.5 %   MCV 87.3  78.0 - 100.0 fL   MCH 27.7  26.0 - 34.0 pg   MCHC 31.7  30.0 - 36.0 g/dL   RDW 19.0 (*) 46.7 - 93.8 %   Platelets 368  150 - 400 K/uL  HEPARIN  LEVEL (UNFRACTIONATED)     Status: None   Collection Time    08/22/13  1:21 PM      Result Value Ref Range   Heparin Unfractionated 0.59  0.30 - 0.70 IU/mL   Comment:            IF HEPARIN RESULTS ARE BELOW     EXPECTED VALUES, AND PATIENT     DOSAGE HAS BEEN CONFIRMED,     SUGGEST FOLLOW UP TESTING     OF ANTITHROMBIN III LEVELS.  COMPREHENSIVE METABOLIC PANEL     Status: Abnormal   Collection Time     08/22/13  1:21 PM      Result Value Ref Range   Sodium 134 (*) 137 - 147 mEq/L   Potassium 4.0  3.7 - 5.3 mEq/L   Chloride 93 (*) 96 - 112 mEq/L   CO2 27  19 - 32 mEq/L   Glucose, Bld 107 (*) 70 - 99 mg/dL   BUN 18  6 - 23 mg/dL   Creatinine, Ser 1.09  0.50 - 1.35 mg/dL   Calcium 9.4  8.4 - 10.5 mg/dL   Total Protein 8.3  6.0 - 8.3 g/dL   Albumin 3.3 (*) 3.5 - 5.2 g/dL   AST 68 (*) 0 - 37 U/L   ALT 24  0 - 53 U/L   Alkaline Phosphatase 92  39 - 117 U/L   Total Bilirubin 1.0  0.3 - 1.2 mg/dL   GFR calc non Af Amer 77 (*) >90 mL/min   GFR calc Af Amer 90 (*) >90 mL/min   Comment: (NOTE)     The eGFR has been calculated using the CKD EPI equation.     This calculation has not been validated in all clinical situations.     eGFR's persistently <90 mL/min signify possible Chronic Kidney     Disease.  GLUCOSE, CAPILLARY     Status: Abnormal   Collection Time    08/22/13  2:41 PM      Result Value Ref Range   Glucose-Capillary 107 (*) 70 - 99 mg/dL  GLUCOSE, CAPILLARY     Status: Abnormal   Collection Time    08/22/13  9:54 PM      Result Value Ref Range   Glucose-Capillary 114 (*) 70 - 99 mg/dL  GLUCOSE, CAPILLARY     Status: Abnormal   Collection Time    08/23/13  6:01 AM      Result Value Ref Range   Glucose-Capillary 101 (*) 70 - 99 mg/dL     HEENT: thrush Cardio: LVAD hum, no S1 or S2 Resp: normal breath sounds GI: BS positive, Distention and NT Extremity:  No Edema Skin:   Intact and  no irritation around panda or trach Neuro: Lethargic, Cranial Nerve II-XII normal, Abnormal Sensory difficult to assess secondary to aphasia and Aphasic, Motor 4/5 RUE and RLE, 3- Left UE and 3- LLE Musc/Skel:  Normal GEN NAD   Assessment/Plan: 1. Functional deficits secondary to Bilateral BG embolic infarct with Left HP, Aphasia vs dysarthria pt to be transferred to [facility] for pump exchange  FIM: FIM - Bathing Bathing Steps Patient Completed:  (refused) Bathing: 3: Mod-Patient  completes 5-7 79f 10 parts or 50-74%  FIM - Upper Body Dressing/Undressing Upper body dressing/undressing steps patient completed: Thread/unthread right sleeve of pullover shirt/dresss;Put head through opening of pull over shirt/dress Upper body dressing/undressing: 0: Wears gown/pajamas-no public clothing FIM - Lower Body Dressing/Undressing Lower body dressing/undressing steps patient completed: Thread/unthread  right pants leg;Pull pants up/down Lower body dressing/undressing: 0: Wears gown/pajamas-no public clothing  FIM - Toileting Toileting steps completed by patient: Adjust clothing prior to toileting;Performs perineal hygiene;Adjust clothing after toileting Toileting Assistive Devices: Grab bar or rail for support Toileting: 4: Steadying assist  FIM - Radio producer Devices: Elevated toilet seat;Grab bars Toilet Transfers: 4-To toilet/BSC: Min A (steadying Pt. > 75%);4-From toilet/BSC: Min A (steadying Pt. > 75%)  FIM - Bed/Chair Transfer Bed/Chair Transfer Assistive Devices: Arm rests Bed/Chair Transfer: 5: Bed > Chair or W/C: Supervision (verbal cues/safety issues)  FIM - Locomotion: Wheelchair Distance: 150 Locomotion: Wheelchair: 1: Total Assistance/staff pushes wheelchair (Pt<25%) FIM - Locomotion: Ambulation Locomotion: Ambulation Assistive Devices: Walker - Rolling (hand orthosis) Ambulation/Gait Assistance: 4: Min assist Locomotion: Ambulation: 4: Travels 150 ft or more with minimal assistance (Pt.>75%)  Comprehension Comprehension Mode: Auditory Comprehension: 5-Understands complex 90% of the time/Cues < 10% of the time  Expression Expression Mode: Nonverbal Expression Assistive Devices: 6-Talk trach valve Expression: 2-Expresses basic 25 - 49% of the time/requires cueing 50 - 75% of the time. Uses single words/gestures.  Social Interaction Social Interaction: 3-Interacts appropriately 50 - 74% of the time - May be physically or  verbally inappropriate.  Problem Solving Problem Solving: 4-Solves basic 75 - 89% of the time/requires cueing 10 - 24% of the time  Memory Memory: 4-Recognizes or recalls 75 - 89% of the time/requires cueing 10 - 24% of the time  Medical Problem List and Plan:  Embolic bilateral BG CVA's, left thalamus CVA, CHF  1. 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 Pain Management: Will monitor for signs of distress. Tylenol prn.  3. Mood: No signs of distress but question awareness/insight into deficits. Continue celexa. Will have LCSW follow along for evaluation/support as indicated.  4. Neuropsych: This patient is capable of making decisions on her own behalf.  5. LVAD: Cardiology to follow for management/support.   -weights, I's and O's being followed closely 6. Urinary retention/Hematuria:  Started on flomax. PVR/cath prn.  7. Severe dysphagia/oral apraxia: Continue panda tube.   50 cc/hr with fluid bolus.  8. Trach dependent: Tolerating CFS #6. Continue to suction as needed pulmonary toilet- not ready for #4 per CCM -trials of PMV with SLP, since pump exchange is planned would hold off on downsize                          9.  Anemia with hemolysis ? r/t pump, monitor, elevated LDH  LOS (Days) 12 A FACE TO FACE EVALUATION WAS PERFORMED  KIRSTEINS,ANDREW E 08/23/2013, 7:08 AM

## 2013-08-23 NOTE — Progress Notes (Signed)
Pt being transferred to [facility] today for possible pump exchange. Pt has primary and back up controller with fixed speed at 9200 RPM and low speed limit 8600 RPM.  Pt has his travel bag with back up controller, extra set of battery clips, and 4 back up batteries (has total of 6 batteries).  His drive line dressing is clean, dry, and intact; his drive line is secured by foley attachment device. He is currently using weekly dressing kit with sorbaview dressing with biopatch on exit site; last changed 08/21/13.  Reassurance and teaching provided for possible upcoming HM II pump exchange.

## 2013-08-23 NOTE — Progress Notes (Signed)
Recreational Therapy Discharge Summary Patient Details  Name: [name] MRN: [id number] Date of Birth: 23-May-1961 Today's Date: 08/23/2013  Long term goals set: 1  Long term goals met: 0  Comments on progress toward goals: Pt is discharging earlier than expected to [facility] for LVAD.surgery.  Pt with limited participation in TR services but is currently min assist for standing tasks.   Reasons for discharge: discharge from hospital  Patient/family agrees with progress made and goals achieved: Yes  Soma Bachand 08/23/2013, 1:17 PM

## 2013-08-23 NOTE — Discharge Summary (Addendum)
Discharged @ around 1300 via [facility] to [facility], report given to [employer], RN @ [phone]. Prior to d/c feeding stopped for transport. Pt belongings packed family given clothing, patient care equipment placed in bag and sent with pt. Kirt Boys, VAD nurse changed dressing and placed pt on battery for VAD. Pt in stable condition for transport.   4:00 PM Late Entry- at discharge O2 sat 100% on 28% Trach Collar @ 5L/min. Panda intact to R nare, tube feeding stopped for transfer. Hepatin infusing @ 12cc/hr via RUE PICC line. Pt verbalized understanding of d/c to [facility], family at bedside. Samanthia Howland, RN

## 2013-08-23 NOTE — Progress Notes (Signed)
Social Work Patient ID: [name], male   DOB: [birth year], 53 y.o.   MRN: [id number] Awaiting call from [facility] regarding bed assignment, so can proceed with [facility].  Pt and family aware and will inform once hear from them.

## 2013-08-24 ENCOUNTER — Telehealth: Payer: Self-pay

## 2013-08-24 NOTE — Telephone Encounter (Signed)
[facility] called to verify sodium chloride injection that was escribed.  Is this out patient use?  [facility] cannot fill.  Please advise.

## 2013-08-26 ENCOUNTER — Encounter: Payer: Self-pay | Admitting: Physical Medicine and Rehabilitation

## 2013-08-26 NOTE — Telephone Encounter (Signed)
Disregard Rx--patient was transferred to another hospital

## 2013-09-13 ENCOUNTER — Encounter: Payer: Self-pay | Admitting: *Deleted

## 2013-10-06 ENCOUNTER — Encounter: Payer: Self-pay | Admitting: Internal Medicine

## 2013-10-06 ENCOUNTER — Telehealth: Payer: Self-pay | Admitting: Internal Medicine

## 2013-10-06 NOTE — Telephone Encounter (Signed)
10-06-13 SENT PT PAST DUE LETTER, WAS DUE TO DO A REMOTE DEFIB CHECK IN [place] 2015/MT

## 2013-11-03 ENCOUNTER — Encounter (HOSPITAL_COMMUNITY): Payer: Self-pay

## 2013-11-03 ENCOUNTER — Ambulatory Visit (HOSPITAL_COMMUNITY)
Admission: RE | Admit: 2013-11-03 | Discharge: 2013-11-03 | Disposition: A | Discharge status: Home / Self Care | Payer: Medicare Other | Source: Ambulatory Visit | Attending: Internal Medicine | Admitting: Internal Medicine

## 2013-11-03 ENCOUNTER — Inpatient Hospital Stay (HOSPITAL_COMMUNITY)
Admission: AD | Admit: 2013-11-03 | Discharge: 2013-11-08 | DRG: 314 | Disposition: A | Discharge status: Home / Self Care | Payer: Medicare Other | Source: Ambulatory Visit | Attending: Internal Medicine | Admitting: Internal Medicine

## 2013-11-03 VITALS — BP 120/0 | HR 112 | Wt 190.0 lb

## 2013-11-03 DIAGNOSIS — Z95811 Presence of heart assist device: Secondary | ICD-10-CM

## 2013-11-03 DIAGNOSIS — R7402 Elevation of levels of lactic acid dehydrogenase (LDH): Secondary | ICD-10-CM | POA: Diagnosis present

## 2013-11-03 DIAGNOSIS — Z91199 Patient's noncompliance with other medical treatment and regimen due to unspecified reason: Secondary | ICD-10-CM

## 2013-11-03 DIAGNOSIS — E876 Hypokalemia: Secondary | ICD-10-CM | POA: Diagnosis present

## 2013-11-03 DIAGNOSIS — I5022 Chronic systolic (congestive) heart failure: Secondary | ICD-10-CM

## 2013-11-03 DIAGNOSIS — R7401 Elevation of levels of liver transaminase levels: Secondary | ICD-10-CM | POA: Diagnosis present

## 2013-11-03 DIAGNOSIS — I4729 Other ventricular tachycardia: Secondary | ICD-10-CM | POA: Diagnosis present

## 2013-11-03 DIAGNOSIS — I428 Other cardiomyopathies: Secondary | ICD-10-CM | POA: Diagnosis present

## 2013-11-03 DIAGNOSIS — I472 Ventricular tachycardia, unspecified: Secondary | ICD-10-CM | POA: Diagnosis present

## 2013-11-03 DIAGNOSIS — IMO0002 Reserved for concepts with insufficient information to code with codable children: Secondary | ICD-10-CM | POA: Diagnosis present

## 2013-11-03 DIAGNOSIS — Z7901 Long term (current) use of anticoagulants: Secondary | ICD-10-CM

## 2013-11-03 DIAGNOSIS — Z931 Gastrostomy status: Secondary | ICD-10-CM

## 2013-11-03 DIAGNOSIS — I639 Cerebral infarction, unspecified: Secondary | ICD-10-CM

## 2013-11-03 DIAGNOSIS — I509 Heart failure, unspecified: Secondary | ICD-10-CM | POA: Diagnosis present

## 2013-11-03 DIAGNOSIS — I129 Hypertensive chronic kidney disease with stage 1 through stage 4 chronic kidney disease, or unspecified chronic kidney disease: Secondary | ICD-10-CM | POA: Diagnosis present

## 2013-11-03 DIAGNOSIS — Z87891 Personal history of nicotine dependence: Secondary | ICD-10-CM

## 2013-11-03 DIAGNOSIS — T82897A Other specified complication of cardiac prosthetic devices, implants and grafts, initial encounter: Principal | ICD-10-CM | POA: Diagnosis present

## 2013-11-03 DIAGNOSIS — N189 Chronic kidney disease, unspecified: Secondary | ICD-10-CM | POA: Diagnosis present

## 2013-11-03 DIAGNOSIS — I4892 Unspecified atrial flutter: Secondary | ICD-10-CM | POA: Diagnosis not present

## 2013-11-03 DIAGNOSIS — R740 Nonspecific elevation of levels of transaminase and lactic acid dehydrogenase [LDH]: Secondary | ICD-10-CM

## 2013-11-03 DIAGNOSIS — D65 Disseminated intravascular coagulation [defibrination syndrome]: Secondary | ICD-10-CM | POA: Diagnosis present

## 2013-11-03 DIAGNOSIS — Z9119 Patient's noncompliance with other medical treatment and regimen: Secondary | ICD-10-CM

## 2013-11-03 DIAGNOSIS — I252 Old myocardial infarction: Secondary | ICD-10-CM

## 2013-11-03 DIAGNOSIS — Z8673 Personal history of transient ischemic attack (TIA), and cerebral infarction without residual deficits: Secondary | ICD-10-CM

## 2013-11-03 DIAGNOSIS — Y831 Surgical operation with implant of artificial internal device as the cause of abnormal reaction of the patient, or of later complication, without mention of misadventure at the time of the procedure: Secondary | ICD-10-CM | POA: Diagnosis present

## 2013-11-03 DIAGNOSIS — I5023 Acute on chronic systolic (congestive) heart failure: Secondary | ICD-10-CM | POA: Diagnosis present

## 2013-11-03 DIAGNOSIS — Z7982 Long term (current) use of aspirin: Secondary | ICD-10-CM

## 2013-11-03 LAB — BASIC METABOLIC PANEL
BUN: 6 mg/dL (ref 6–23)
CHLORIDE: 101 meq/L (ref 96–112)
CO2: 25 meq/L (ref 19–32)
Calcium: 8.9 mg/dL (ref 8.4–10.5)
Creatinine, Ser: 1.08 mg/dL (ref 0.50–1.35)
GFR calc Af Amer: >90 mL/min (ref >90)
GFR calc non Af Amer: 78 mL/min — ABNORMAL LOW (ref >90)
Glucose, Bld: 97 mg/dL (ref 70–99)
POTASSIUM: 3 meq/L — AB (ref 3.7–5.3)
SODIUM: 140 meq/L (ref 137–147)

## 2013-11-03 LAB — CBC
HCT: 34.2 % — ABNORMAL LOW (ref 39.0–52.0)
Hemoglobin: 10.7 g/dL — ABNORMAL LOW (ref 13.0–17.0)
MCH: 25.2 pg — ABNORMAL LOW (ref 26.0–34.0)
MCHC: 31.3 g/dL (ref 30.0–36.0)
MCV: 80.5 fL (ref 78.0–100.0)
PLATELETS: 270 10*3/uL (ref 150–400)
RBC: 4.25 MIL/uL (ref 4.22–5.81)
RDW: 15.7 % — AB (ref 11.5–15.5)
WBC: 3.9 10*3/uL — ABNORMAL LOW (ref 4.0–10.5)

## 2013-11-03 LAB — LACTATE DEHYDROGENASE: LDH: 647 U/L — AB (ref 94–250)

## 2013-11-03 LAB — MRSA PCR SCREENING: MRSA by PCR: NEGATIVE

## 2013-11-03 LAB — MAGNESIUM: MAGNESIUM: 1.6 mg/dL (ref 1.5–2.5)

## 2013-11-03 LAB — PROTIME-INR
INR: 1.69 — ABNORMAL HIGH (ref 0.00–1.49)
PROTHROMBIN TIME: 19.4 s — AB (ref 11.6–15.2)

## 2013-11-03 LAB — PRO B NATRIURETIC PEPTIDE: PRO B NATRI PEPTIDE: 3033 pg/mL — AB (ref 0–125)

## 2013-11-03 MED ORDER — ASPIRIN EC 325 MG PO TBEC
325.0000 mg | DELAYED_RELEASE_TABLET | Freq: Every day | ORAL | Status: DC
  Administered 2013-11-04 – 2013-11-08 (×5): 325 mg via ORAL
  Filled 2013-11-03 (×5): qty 1

## 2013-11-03 MED ORDER — FUROSEMIDE 20 MG PO TABS: 20.0000 mg | ORAL_TABLET | Freq: Every day | ORAL | Status: DC

## 2013-11-03 MED ORDER — MAGNESIUM SULFATE 40 MG/ML IJ SOLN
2.0000 g | Freq: Once | INTRAMUSCULAR | Status: AC
  Administered 2013-11-03: 2 g via INTRAVENOUS
  Filled 2013-11-03: qty 50

## 2013-11-03 MED ORDER — POTASSIUM CHLORIDE CRYS ER 20 MEQ PO TBCR
20.0000 meq | EXTENDED_RELEASE_TABLET | Freq: Two times a day (BID) | ORAL | Status: DC
  Filled 2013-11-03 (×2): qty 1

## 2013-11-03 MED ORDER — FUROSEMIDE 10 MG/ML IJ SOLN
40.0000 mg | Freq: Two times a day (BID) | INTRAMUSCULAR | Status: DC
  Administered 2013-11-03: 40 mg via INTRAVENOUS
  Filled 2013-11-03 (×3): qty 4

## 2013-11-03 MED ORDER — PANTOPRAZOLE SODIUM 40 MG PO TBEC
40.0000 mg | DELAYED_RELEASE_TABLET | Freq: Every day | ORAL | Status: DC
  Administered 2013-11-03 – 2013-11-08 (×6): 40 mg via ORAL
  Filled 2013-11-03 (×6): qty 1

## 2013-11-03 MED ORDER — HYDRALAZINE HCL 50 MG PO TABS
50.0000 mg | ORAL_TABLET | Freq: Three times a day (TID) | ORAL | Status: DC
  Administered 2013-11-03 – 2013-11-04 (×2): 50 mg via ORAL
  Filled 2013-11-03 (×5): qty 1

## 2013-11-03 MED ORDER — WARFARIN - PHARMACIST DOSING INPATIENT
Freq: Every day | Status: DC
  Administered 2013-11-05: 18:00:00

## 2013-11-03 MED ORDER — ACETAMINOPHEN 325 MG PO TABS: 650.0000 mg | ORAL_TABLET | ORAL | Status: DC | PRN

## 2013-11-03 MED ORDER — POTASSIUM CHLORIDE CRYS ER 20 MEQ PO TBCR
60.0000 meq | EXTENDED_RELEASE_TABLET | Freq: Once | ORAL | Status: AC
  Administered 2013-11-03: 60 meq via ORAL
  Filled 2013-11-03: qty 3

## 2013-11-03 MED ORDER — HYDRALAZINE HCL 20 MG/ML IJ SOLN
10.0000 mg | INTRAMUSCULAR | Status: DC | PRN
  Administered 2013-11-03 – 2013-11-04 (×3): 10 mg via INTRAVENOUS
  Filled 2013-11-03 (×3): qty 1

## 2013-11-03 MED ORDER — ASPIRIN EC 81 MG PO TBEC: 81.0000 mg | DELAYED_RELEASE_TABLET | Freq: Every day | ORAL | Status: DC

## 2013-11-03 MED ORDER — ACETAMINOPHEN 650 MG RE SUPP
650.0000 mg | RECTAL | Status: DC | PRN
  Filled 2013-11-03: qty 1

## 2013-11-03 MED ORDER — AMIODARONE HCL 200 MG PO TABS
200.0000 mg | ORAL_TABLET | Freq: Every day | ORAL | Status: DC
  Administered 2013-11-03 – 2013-11-05 (×3): 200 mg via ORAL
  Filled 2013-11-03 (×3): qty 1

## 2013-11-03 MED ORDER — SERTRALINE HCL 100 MG PO TABS
100.0000 mg | ORAL_TABLET | Freq: Every day | ORAL | Status: DC
  Administered 2013-11-04 – 2013-11-08 (×5): 100 mg via ORAL
  Filled 2013-11-03 (×5): qty 1

## 2013-11-03 MED ORDER — HEPARIN (PORCINE) IN NACL 100-0.45 UNIT/ML-% IJ SOLN
1400.0000 [IU]/h | INTRAMUSCULAR | Status: DC
  Administered 2013-11-03: 1250 [IU]/h via INTRAVENOUS
  Administered 2013-11-04 – 2013-11-05 (×2): 1400 [IU]/h via INTRAVENOUS
  Filled 2013-11-03 (×5): qty 250

## 2013-11-03 MED ORDER — ISOSORBIDE MONONITRATE ER 60 MG PO TB24
60.0000 mg | ORAL_TABLET | Freq: Every day | ORAL | Status: DC
  Administered 2013-11-04 – 2013-11-07 (×3): 60 mg via ORAL
  Filled 2013-11-03 (×5): qty 1

## 2013-11-03 MED ORDER — WARFARIN SODIUM 1 MG PO TABS
1.0000 mg | ORAL_TABLET | Freq: Once | ORAL | Status: AC
  Administered 2013-11-03: 1 mg via ORAL
  Filled 2013-11-03: qty 1

## 2013-11-03 MED ORDER — MIRTAZAPINE 30 MG PO TABS
30.0000 mg | ORAL_TABLET | Freq: Every day | ORAL | Status: DC
  Administered 2013-11-03 – 2013-11-07 (×5): 30 mg via ORAL
  Filled 2013-11-03 (×7): qty 1

## 2013-11-03 MED ORDER — TAMSULOSIN HCL 0.4 MG PO CAPS
0.4000 mg | ORAL_CAPSULE | Freq: Every day | ORAL | Status: DC
  Administered 2013-11-04 – 2013-11-08 (×5): 0.4 mg via ORAL
  Filled 2013-11-03 (×5): qty 1

## 2013-11-03 NOTE — Patient Instructions (Signed)
Start taking lasix 20 mg daily.  Follow up with [facility] next week. Make sure you have an appointment.  Bring all your medications every time you go to a visit. Do not miss any visits.  Will bring a walker to your house.  Will refer to Neuro Rehab.  Call any issues  [phone]

## 2013-11-03 NOTE — Progress Notes (Signed)
ANTICOAGULATION CONSULT NOTE - Initial Consult  Pharmacy Consult for Heparin + warfarin Indication: LVAD with sub-therapeutic INR  Allergies  Allergen Reactions  . Ace Inhibitors Cough  . Lexapro [Escitalopram Oxalate] Other (See Comments)    somnolence    Patient Measurements: Weight 86.2 kg on 11/03/13 Height: 5\' 4"  (162.6 cm) IBW/kg (Calculated) : 59.2 Heparin Dosing Weight: 86.2 kg  Vital Signs: BP: 120/0 mmHg (05/13 1245) Pulse Rate: 112 (05/13 1245)  Labs:  Recent Labs  11/03/13 1229 11/03/13 1242  HGB  --  10.7*  HCT  --  34.2*  PLT  --  270  LABPROT 19.4*  --   INR 1.69*  --   CREATININE 1.08  --     The CrCl is unknown because both a height and weight (above a minimum accepted value) are required for this calculation.   Medical History: Past Medical History  Diagnosis Date  . CHF (congestive heart failure)     EF- 10-15  . Medically noncompliant   . Mitral regurgitation   . Tobacco user   . HTN (hypertension)   . AICD (automatic cardioverter/defibrillator) present   . GERD (gastroesophageal reflux disease)   . Substance abuse   . Chronic renal insufficiency   . Syncope   . Thrombus 08/06/2010  . SYSTOLIC HEART FAILURE, CHRONIC 09/22/2008    Qualifier: Diagnosis of  By: Gala Romney, MD, Trixie Dredge   . LV (left ventricular) mural thrombus 01/28/2011  . ICD - IN SITU 09/16/2008    Qualifier: Diagnosis of  By: Wonda Amis    . MITRAL STENOSIS/ INSUFFICIENCY, NON-RHEUMATIC 09/22/2008    Qualifier: Diagnosis of  By: Gala Romney, MD, Trixie Dredge Hepatomegaly 09/16/2008    Qualifier: Diagnosis of  By: Wonda Amis    . High cholesterol 02/26/2012    "at one time"  . Sleep apnea   . Exertional dyspnea 02/26/2012  . History of blood transfusion 08/2011    "when I had heart pump"  . Migraines   . COMMON MIGRAINE 06/14/2009    Qualifier: Diagnosis of  By: Jonny Ruiz MD, Len Blalock   . History of gout 02/26/2012  . Depression 08/11/2013    Pt  denies  . Bipolar affective disorder 10/22/2011    pt denies this hx 02/26/2012    Medications:  Warfarin PTA 4 mg on T-Th-Sat and 5mg  all other days. Last dose 11/03/13 (today).   Assessment: 52 YOM on chronic warfarin prior to admission for LVAD thrombosis s/p LVAD pump exchange 08/31/13 at [facility] ( post op complications included bleeding with transfusions and AKI requiring HD, last 09/24/13) now admitted for increased weight, SOB, and LE edema and sub-therapeutic INR to start heparin + warfarin. INR today is 1.69 and concern for early thrombosis/hemolysis with elevated LDH (600s). Patient states took warfarin 5mg  today. Note patient was here in February and was therapeutic on 1250 units/hr of heparin. No bleeding noted.   Goal of Therapy:  INR 2-3 Heparin level 0.3-0.7 units/ml Monitor platelets by anticoagulation protocol: Yes   Plan:  1. Heparin drip at 1250 units/hr (no bolus 2/2 elevated INR). 2. Extra 1mg  warfarin x1 tonight due to sub-therapeutic INR.  3. Heparin level in 6 hours. 4. Daily PT/INR, heparin level and CBC while on therapy.   Link Snuffer, PharmD, BCPS Clinical Pharmacist [phone] 11/03/2013,7:18 PM

## 2013-11-03 NOTE — H&P (Signed)
Advanced Heart Failure Team History and Physical Note   Primary Physician: Dr. Oliver Barre Primary Cardiologist:  Dr. Gala Romney  Reason for Admission: Hemolysis  HPI:    [name] is a 53 yo male with a history of severe CHF, NICM s/p LVAD and TVR (09/2011), VT, NSTEMI, LV thrombus, CKD, GERD, stroke and LVAD hemolysis with LVAD exchange (08/2013).  He was admitted to [facility] January 2015 with NSTEMI and RV failure. He had a difficult course that was complicated by embolic right CVA. He was intubated and eventually had a tracheostomy placed 07/28/13. He was transferred to inpatient rehab for extensive rehab and was making progression, however despite aggressive therapy with heparin, coumadin, ASA and Plavix, his LDH remained persistently elevated in the 950 range (was about 1750 on admit). He did not experience any pump dysfunction, however the case was discussed with Dr Allena Katz and [name] at [facility] and it was felt to transfer him down there for possible pump exchange. He underwent a pump exchange 08/31/13. Post op was complicated by bleeding and he received multiple transfusions. He had AKI and had intermittent HD and underwent last HD on 09/24/13. Last Cr on chart 1.9 (10/02/13)  Patient presented to clinic today without scheduled visit because he reported his weight was trending up and needed lasix which he did not have. Labs were drawn and discussed with patient in depth about the need to take all medications to every visit with him. Started on PO lasix and told to call next week after appointment with [facility], however after receiving labs we decided to direct admit for hemolysis. Pertinent labs today were Cr 1.08, K+ 3.0, INR 1.69, LDH 647 and pro-BNP 3033. Called EMS to transport patient to hospital. Complaints of LE edema, SOB and weight gain.   VAD interrogated personally.  Flow: 3.9 Speed: 9600  PI: 5.8 Power: 5.3 Very rare PI events, few low voltage alarms. Few low flow alarms, multiple low flow alarms around  4/20-4/24  Review of Systems: [y] = yes, [ ]  = no   General: Weight gain [Y]; Weight loss [ ] ; Anorexia [ ] ; Fatigue [ ] ; Fever [ ] ; Chills [ ] ; Weakness [ ]   Cardiac: Chest pain/pressure [N]; Resting SOB [ N; Exertional SOB [Y]; Orthopnea [ Y; Pedal Edema [ Y; Palpitations [ ] ; Syncope [ ] ; Presyncope [ ] ; Paroxysmal nocturnal dyspnea[ ]   Pulmonary: Cough [ ] ; Wheezing[ ] ; Hemoptysis[ ] ; Sputum [ ] ; Snoring [ ]   GI: Vomiting[ ] ; Dysphagia[ ] ; Melena[ ] ; Hematochezia [ ] ; Heartburn[ ] ; Abdominal pain [ ] ; Constipation [ ] ; Diarrhea [ ] ; BRBPR [ ]   GU: Hematuria[ ] ; Dysuria [ ] ; Nocturia[ ]   Vascular: Pain in legs with walking [ ] ; Pain in feet with lying flat [ ] ; Non-healing sores [ ] ; Stroke [ ] ; TIA [ ] ; Slurred speech [ ] ;  Neuro: Headaches[ ] ; Vertigo[ ] ; Seizures[ ] ; Paresthesias[ ] ;Blurred vision [ ] ; Diplopia [ ] ; Vision changes [ ]   Ortho/Skin: Arthritis [ ] ; Joint pain [ ] ; Muscle pain [ ] ; Joint swelling [ ] ; Back Pain [ ] ; Rash [ ]   Psych: Depression[Y; Anxiety[ ]   Heme: Bleeding problems [ ] ; Clotting disorders [ ] ; Anemia [ ]   Endocrine: Diabetes [ ] ; Thyroid dysfunction[ ]   Home Medications Prior to Admission medications   Medication Sig Start Date End Date Taking? Authorizing Provider  acetaminophen (TYLENOL) 325 MG tablet Place 2 tablets (650 mg total) into feeding tube every 4 (four) hours as needed for headache or mild pain.  08/23/13   Jacquelynn Cree, PA-C  allopurinol (ZYLOPRIM) 100 MG tablet Place 1 tablet (100 mg total) into feeding tube daily. For gout 08/23/13   Evlyn Kanner Love, PA-C  Amino Acids-Protein Hydrolys (FEEDING SUPPLEMENT, PRO-STAT SUGAR FREE 64,) LIQD Place 30 mLs into feeding tube 2 (two) times daily. 08/23/13   Jacquelynn Cree, PA-C  antiseptic oral rinse (BIOTENE) LIQD 15 mLs by Mouth Rinse route QID. 08/11/13   Dolores Patty, MD  aspirin 81 MG chewable tablet Place 1 tablet (81 mg total) into feeding tube daily. 08/23/13   Jacquelynn Cree, PA-C  chlorhexidine  (PERIDEX) 0.12 % solution 15 mLs by Mouth Rinse route 2 (two) times daily. 08/11/13   Dolores Patty, MD  citalopram (CELEXA) 10 MG tablet Place 1 tablet (10 mg total) into feeding tube daily. 08/23/13   Jacquelynn Cree, PA-C  furosemide (LASIX) 20 MG tablet Take 1 tablet (20 mg total) by mouth daily. 11/03/13   [name], NP  furosemide (LASIX) 40 MG tablet Place 1 tablet (40 mg total) into feeding tube daily. 08/23/13   Jacquelynn Cree, PA-C  glycopyrrolate (ROBINUL) 1 MG tablet Place 1 tablet (1 mg total) into feeding tube daily. 08/23/13   Evlyn Kanner Love, PA-C  guaiFENesin-dextromethorphan (ROBITUSSIN DM) 100-10 MG/5ML syrup Place 5-10 mLs into feeding tube every 6 (six) hours as needed for cough. 08/23/13   Jacquelynn Cree, PA-C  heparin 100-0.45 UNIT/ML-% infusion Inject 1,200 Units/hr into the vein continuous. 08/23/13   Evlyn Kanner Love, PA-C  magnesium oxide (MAG-OX) 400 (241.3 MG) MG tablet Place 1 tablet (400 mg total) into feeding tube daily. 08/23/13   Jacquelynn Cree, PA-C  Nutritional Supplements (FEEDING SUPPLEMENT, [name],) LIQD At 50 cc/hr 08/23/13   Evlyn Kanner Love, PA-C  ondansetron [facility]) 4 MG/2ML SOLN injection Inject 2 mLs (4 mg total) into the vein every 6 (six) hours as needed for nausea. 08/11/13   Dolores Patty, MD  pantoprazole sodium (PROTONIX) 40 mg/20 mL PACK Place 20 mLs (40 mg total) into feeding tube daily at 12 noon. 08/11/13   Eric Buckles Takyah Ciaramitaro, MD  sodium chloride 0.9 % injection 10-40 mLs by Intracatheter route every 12 (twelve) hours. 08/23/13   Jacquelynn Cree, PA-C  spironolactone (ALDACTONE) 25 MG tablet Place 1 tablet (25 mg total) into feeding tube daily. 08/23/13   Jacquelynn Cree, PA-C  tamsulosin (FLOMAX) 0.4 MG CAPS capsule Take 1 capsule (0.4 mg total) by mouth daily. 08/11/13   Dolores Patty, MD  warfarin (COUMADIN) 5 MG tablet Place 1 tablet (5 mg total) into feeding tube daily. 08/23/13   Jacquelynn Cree, PA-C  Water For Irrigation, Sterile (FREE WATER) SOLN Place  150 mLs into feeding tube every 6 (six) hours. 08/23/13   Jacquelynn Cree, PA-C    Past Medical History: Past Medical History  Diagnosis Date  . CHF (congestive heart failure)     EF- 10-15  . Medically noncompliant   . Mitral regurgitation   . Tobacco user   . HTN (hypertension)   . AICD (automatic cardioverter/defibrillator) present   . GERD (gastroesophageal reflux disease)   . Substance abuse   . Chronic renal insufficiency   . Syncope   . Thrombus 08/06/2010  . SYSTOLIC HEART FAILURE, CHRONIC 09/22/2008    Qualifier: Diagnosis of  By: Gala Romney, MD, Trixie Dredge   . LV (left ventricular) mural thrombus 01/28/2011  . ICD - IN SITU 09/16/2008  Qualifier: Diagnosis of  By: Wonda AmisBednar, NP-C, Michelle    . MITRAL STENOSIS/ INSUFFICIENCY, NON-RHEUMATIC 09/22/2008    Qualifier: Diagnosis of  By: [name], MD, Trixie DredgeFACC, Trinh Sanjose R   . Hepatomegaly 09/16/2008    Qualifier: Diagnosis of  By: Wonda AmisBednar, NP-C, Michelle    . High cholesterol 02/26/2012    "at one time"  . Sleep apnea   . Exertional dyspnea 02/26/2012  . History of blood transfusion 08/2011    "when I had heart pump"  . Migraines   . COMMON MIGRAINE 06/14/2009    Qualifier: Diagnosis of  By: Jonny RuizJohn MD, Len BlalockJames W   . History of gout 02/26/2012  . Depression 08/11/2013    Pt denies  . Bipolar affective disorder 10/22/2011    pt denies this hx 02/26/2012    Past Surgical History: Past Surgical History  Procedure Laterality Date  . Cardiac defibrillator placement  ~ 2008  . Left ventricular assist device  08/2011  . Radiology with anesthesia N/A 07/19/2013    Procedure: RADIOLOGY WITH ANESTHESIA;  Surgeon: Oneal GroutSanjeev K Deveshwar, MD;  Location: [facility] OR;  Service: Radiology;  Laterality: N/A;  . Tracheostomy      feinstein    Family History: Family History  Problem Relation Age of Onset  . Coronary artery disease Neg Hx     Social History: History   Social History  . Marital Status: Divorced    Spouse Name: N/A    Number of Children: N/A   . Years of Education: N/A   Social History Main Topics  . Smoking status: Former Smoker -- 0.25 packs/day for 33 years    Types: Cigarettes    Quit date: 06/29/2011  . Smokeless tobacco: Former NeurosurgeonUser    Quit date: 06/29/2011  . Alcohol Use: No  . Drug Use: No     Comment: denies  . Sexual Activity: Not Currently   Other Topics Concern  . Not on file   Social History Narrative  . No narrative on file    Allergies:  Allergies  Allergen Reactions  . Ace Inhibitors Cough  . Lexapro [Escitalopram Oxalate] Other (See Comments)    somnolence    Objective:    Vital Signs:   Pulse Rate:  [112] 112 (05/13 1245) BP: (120)/(0) 120/0 mmHg (05/13 1245) SpO2:  [98 %] 98 % (05/13 1245) Weight:  [190 lb (86.183 kg)] 190 lb (86.183 kg) (05/13 1245)   There were no vitals filed for this visit.  Physical Exam: General:  Well appearing. No resp difficulty HEENT: normal, scar from trach Neck: supple. JVP 9 Carotids 2+ bilat; no bruits. No lymphadenopathy or thryomegaly appreciated. Cor: LVAD hum Lungs: clear Abdomen: soft, nontender, +istended. No hepatosplenomegaly. No bruits or masses. Good bowel sounds. Extremities: no cyanosis, clubbing, rash, 2+ bilateral LE edema Neuro: alert & orientedx3, cranial nerves grossly intact. moves all 4 extremities w/o difficulty. Affect pleasant  Telemetry: SR 90s  Labs: Basic Metabolic Panel:  Recent Labs Lab 11/03/13 1229  NA 140  K 3.0*  CL 101  CO2 25  GLUCOSE 97  BUN 6  CREATININE 1.08  CALCIUM 8.9    Liver Function Tests: No results found for this basename: AST, ALT, ALKPHOS, BILITOT, PROT, ALBUMIN,  in the last 168 hours No results found for this basename: LIPASE, AMYLASE,  in the last 168 hours No results found for this basename: AMMONIA,  in the last 168 hours  CBC:  Recent Labs Lab 11/03/13 1242  WBC 3.9*  HGB 10.7*  HCT 34.2*  MCV 80.5  PLT 270    Cardiac Enzymes: No results found for this basename: CKTOTAL,  CKMB, CKMBINDEX, TROPONINI,  in the last 168 hours  BNP: BNP (last 3 results)  Recent Labs  07/17/13 0923 11/03/13 1229  PROBNP 2538.0* 3033.0*    CBG: No results found for this basename: GLUCAP,  in the last 168 hours  Coagulation Studies:  Recent Labs  11/03/13 1229  LABPROT 19.4*  INR 1.69*     Imaging:  No results found.      Assessment:   1) Hemolysis 2) A/C systolic HF 3) NICM - s/p LVAD (08/2011 and 08/2013) 4) CVA 5) NSVT 6) HTN 7) Hypokalemia  Plan/Discussion:    [name] is well known to the HF/LVAD Team. He recently was discharged from [facility] after a LVAD pump exchange for hemolysis. He reports he was discharged not on any diuretics and that over the past 2 weeks he has noticed increased weight, bilateral LE edema and SOB.   Presented to clinic today for prescription for diuretics and labs were drawn. Labs resulted and LDH elevated in the 600s, last was 298 (09/29/2013). Concern for early pump thrombosis and hemolysis. INR today sub therapeutic 1.69. Will admit for IV heparin and diuresis with lasix 40 mg BID. Will follow INR closely and will have pharmacy dose heparin and coumadin.   K+ 3.0 will supplement.   Very concerned that patient is not able to manage his medications or take care of himself completely at home currently. He is not sure what medications he currently is on and will need to make sure we get [facility] involved and discuss with mother about patient needing assistance with medications. Could benefit from intensive rehab program and will discuss with Dr. Gala Romney about placing IR consult once patient is stable.  Not on BB with RV failure in the past. No ACE-I/ARB or Cleda Daub with recent CKD.   LVAD parameters stable currently. He has had multiple low flow alarms starting around 4/20-4/24. Patient reports he did not know he had any alarms, however on 4/21 had numerous alarms. These low flow alarms could have contributed to possible thrombosis  d/t stagnant blood. Will need ramp ECHO to assess RV and pump speed.   I reviewed the LVAD parameters from today, and compared the results to the patient's prior recorded data. No programming changes were made. The LVAD is functioning within specified parameters. The patient performs LVAD self-test daily. LVAD interrogation was negative for any significant power changes, alarms or PI events/speed drops. LVAD equipment check completed and is in good working order. Back-up equipment present. LVAD education done on emergency procedures and precautions and reviewed exit site care.    Length of Stay:  [name] NP-C 11/03/2013, 5:53 PM  Advanced Heart Failure Team Pager [phone] (M-F; 7a - 4p)   For LVAD issues please page:  VAD Team Pager [phone] (7am - 7am)  Patient seen and examined with [name], NP. We discussed all aspects of the encounter. I agree with the assessment and plan as stated above.   Patient admitted for subtherapeutic INR and rising LDH concerning for possible early pump thrombosis in setting of recent pump exchange due to pump thromobsis associate with NSTEMI and embolic CVA. VAD interrogated personally. Agree with heparin and restarting home med regimen. Need for compliance stressed.  Eric Buckles Purity Irmen,MD 11:58 PM

## 2013-11-04 ENCOUNTER — Encounter (HOSPITAL_COMMUNITY): Payer: Self-pay | Admitting: Cardiology

## 2013-11-04 LAB — BASIC METABOLIC PANEL
BUN: 6 mg/dL (ref 6–23)
CALCIUM: 9.1 mg/dL (ref 8.4–10.5)
CO2: 24 mEq/L (ref 19–32)
Chloride: 99 mEq/L (ref 96–112)
Creatinine, Ser: 1.03 mg/dL (ref 0.50–1.35)
GFR, EST NON AFRICAN AMERICAN: 83 mL/min — AB (ref >90)
Glucose, Bld: 120 mg/dL — ABNORMAL HIGH (ref 70–99)
Potassium: 3.8 mEq/L (ref 3.7–5.3)
Sodium: 140 mEq/L (ref 137–147)

## 2013-11-04 LAB — CBC
HEMATOCRIT: 36 % — AB (ref 39.0–52.0)
Hemoglobin: 11.7 g/dL — ABNORMAL LOW (ref 13.0–17.0)
MCH: 25.8 pg — ABNORMAL LOW (ref 26.0–34.0)
MCHC: 32.5 g/dL (ref 30.0–36.0)
MCV: 79.5 fL (ref 78.0–100.0)
Platelets: 293 10*3/uL (ref 150–400)
RBC: 4.53 MIL/uL (ref 4.22–5.81)
RDW: 15.9 % — ABNORMAL HIGH (ref 11.5–15.5)
WBC: 6.2 10*3/uL (ref 4.0–10.5)

## 2013-11-04 LAB — LACTATE DEHYDROGENASE: LDH: 868 U/L — ABNORMAL HIGH (ref 94–250)

## 2013-11-04 LAB — PROTIME-INR
INR: 1.84 — ABNORMAL HIGH (ref 0.00–1.49)
PROTHROMBIN TIME: 20.7 s — AB (ref 11.6–15.2)

## 2013-11-04 LAB — HEPARIN LEVEL (UNFRACTIONATED)
Heparin Unfractionated: 0.17 IU/mL — ABNORMAL LOW (ref 0.30–0.70)
Heparin Unfractionated: 0.48 IU/mL (ref 0.30–0.70)
Heparin Unfractionated: 0.37 IU/mL (ref 0.30–0.70)

## 2013-11-04 MED ORDER — WARFARIN SODIUM 5 MG PO TABS
5.0000 mg | ORAL_TABLET | Freq: Once | ORAL | Status: AC
  Administered 2013-11-04: 5 mg via ORAL
  Filled 2013-11-04: qty 1

## 2013-11-04 MED ORDER — POTASSIUM CHLORIDE CRYS ER 20 MEQ PO TBCR
20.0000 meq | EXTENDED_RELEASE_TABLET | Freq: Every day | ORAL | Status: DC
  Administered 2013-11-04 – 2013-11-08 (×5): 20 meq via ORAL
  Filled 2013-11-04 (×5): qty 1

## 2013-11-04 MED ORDER — FUROSEMIDE 40 MG PO TABS
40.0000 mg | ORAL_TABLET | Freq: Every day | ORAL | Status: DC
  Administered 2013-11-04: 40 mg via ORAL
  Filled 2013-11-04 (×2): qty 1

## 2013-11-04 MED ORDER — METOPROLOL TARTRATE 1 MG/ML IV SOLN
5.0000 mg | Freq: Once | INTRAVENOUS | Status: AC
  Administered 2013-11-04: 5 mg via INTRAVENOUS
  Filled 2013-11-04: qty 5

## 2013-11-04 MED ORDER — METOPROLOL TARTRATE 1 MG/ML IV SOLN
2.5000 mg | Freq: Once | INTRAVENOUS | Status: AC | PRN
  Administered 2013-11-04: 2.5 mg via INTRAVENOUS
  Filled 2013-11-04: qty 5

## 2013-11-04 MED ORDER — LOSARTAN POTASSIUM 25 MG PO TABS
25.0000 mg | ORAL_TABLET | Freq: Every day | ORAL | Status: DC
  Administered 2013-11-04 – 2013-11-07 (×4): 25 mg via ORAL
  Filled 2013-11-04 (×5): qty 1

## 2013-11-04 MED ORDER — METOPROLOL TARTRATE 1 MG/ML IV SOLN: 2.5000 mg | INTRAVENOUS | Status: DC | PRN

## 2013-11-04 MED ORDER — HYDRALAZINE HCL 50 MG PO TABS
75.0000 mg | ORAL_TABLET | Freq: Three times a day (TID) | ORAL | Status: DC
  Administered 2013-11-04 – 2013-11-06 (×5): 75 mg via ORAL
  Filled 2013-11-04 (×13): qty 1

## 2013-11-04 NOTE — Progress Notes (Signed)
Utilization Review Completed.Duel Conrad T Dowell5/14/2015  

## 2013-11-04 NOTE — Progress Notes (Signed)
ANTICOAGULATION CONSULT NOTE - Follow up Consult  Pharmacy Consult for Heparin + Coumadin Indication: LVAD with subtherapeutic INR  Allergies  Allergen Reactions  . Ace Inhibitors Cough  . Lexapro [Escitalopram Oxalate] Other (See Comments)    somnolence    Patient Measurements: Weight 86.2 kg on 11/03/13 Height: 5\' 4"  (162.6 cm) Weight: 178 lb 5.6 oz (80.9 kg) IBW/kg (Calculated) : 59.2 Heparin Dosing Weight: 76kg  Vital Signs: Temp: 98.3 F (36.8 C) (05/14 0735) Temp src: Oral (05/14 0735) Pulse Rate: 122 (05/14 1000)  Labs:  Recent Labs  11/03/13 1229 11/03/13 1242 11/04/13 0235 11/04/13 0236 11/04/13 0910  HGB  --  10.7*  --  11.7*  --   HCT  --  34.2*  --  36.0*  --   PLT  --  270  --  293  --   LABPROT 19.4*  --  20.7*  --   --   INR 1.69*  --  1.84*  --   --   HEPARINUNFRC  --   --  0.17*  --  0.48  CREATININE 1.08  --  1.03  --   --     Estimated Creatinine Clearance: 82.4 ml/min (by C-G formula based on Cr of 1.03).   Medical History: Past Medical History  Diagnosis Date  . CHF (congestive heart failure)     EF- 10-15  . Medically noncompliant   . Mitral regurgitation   . Tobacco user   . HTN (hypertension)   . AICD (automatic cardioverter/defibrillator) present   . GERD (gastroesophageal reflux disease)   . Substance abuse   . Chronic renal insufficiency   . Syncope   . Thrombus 08/06/2010  . SYSTOLIC HEART FAILURE, CHRONIC 09/22/2008    Qualifier: Diagnosis of  By: Gala Romney, MD, Trixie Dredge   . LV (left ventricular) mural thrombus 01/28/2011  . ICD - IN SITU 09/16/2008    Qualifier: Diagnosis of  By: Wonda Amis    . MITRAL STENOSIS/ INSUFFICIENCY, NON-RHEUMATIC 09/22/2008    Qualifier: Diagnosis of  By: Gala Romney, MD, Trixie Dredge Hepatomegaly 09/16/2008    Qualifier: Diagnosis of  By: Wonda Amis    . High cholesterol 02/26/2012    "at one time"  . Sleep apnea   . Exertional dyspnea 02/26/2012  . History of blood  transfusion 08/2011    "when I had heart pump"  . Migraines   . COMMON MIGRAINE 06/14/2009    Qualifier: Diagnosis of  By: Jonny Ruiz MD, Len Blalock   . History of gout 02/26/2012  . Depression 08/11/2013    Pt denies  . Bipolar affective disorder 10/22/2011    pt denies this hx 02/26/2012    Medications:  Heparin @ 1400 units/hr  Assessment: 52yom on chronic coumadin prior to admission for LVAD (initially implanted 09/2011 then s/p pump exchange 08/2013 at [facility]). He was admitted for increased weight, SOB, and LE edema.  INR subtherapeutic on admission and concern for hemolysis as LDH elevated and trending up. Heparin bridge started.  Heparin level is therapeutic after rate increase this morning. INR remains below goal but is trending up. CBC stable. No bleeding reported.  Goal of Therapy:  INR 2-3 Heparin level 0.3-0.7 units/ml Monitor platelets by anticoagulation protocol: Yes   Plan:  1) Continue heparin at 1400 units/hr 2) Check confirmatory heparin level in 6 hours 3) Coumadin 5mg  x 1 4) INR in AM  Louie Casa, PharmD, BCPS 11/04/2013,11:05 AM

## 2013-11-04 NOTE — Progress Notes (Signed)
Dressing changed using sterile technique; drive line anchor not available; Ali taped drive line in place; Karie Mainland will bring anchors in AM.  Park Breed, RN

## 2013-11-04 NOTE — Evaluation (Signed)
Physical Therapy Evaluation Patient Details Name: [name] MRN: [id number] DOB: 02/19/1961 Today's Date: 11/04/2013   History of Present Illness  Arkan is 53 y/o male with a history of severe CHF due to NICM EF 10-15% s/p HM II LVAD placed at [facility] March 2013. His medical history also consists of CRI, NSVT s/p ICD and LV thrombus. Pt adm Jan 2015 with NSTEMI due to pump thrombosis and then suffered Rt CVA. Pt went to CIR and then was transferred to [facility] for exchange of LVAD. Pt returned home earlier this month before returning 5/13 with hemolysis.  Clinical Impression  Pt admitted with above. Pt currently with functional limitations due to the deficits listed below (see PT Problem List).  Pt will benefit from skilled PT to increase their independence and safety with mobility to allow discharge to the venue listed below. Pt doing well with mobility and should reach modified independent level quickly.      Follow Up Recommendations Home health PT (possibly)    Equipment Recommendations  None recommended by PT    Recommendations for Other Services       Precautions / Restrictions Precautions Precautions: Fall Precaution Comments: LVAD      Mobility  Bed Mobility Overal bed mobility: Needs Assistance Bed Mobility: Supine to Sit     Supine to sit: Supervision     General bed mobility comments: For lines/tubes only  Transfers Overall transfer level: Needs assistance Equipment used: None Transfers: Sit to/from Stand Sit to Stand: Supervision         General transfer comment: for lines/tubes only  Ambulation/Gait Ambulation/Gait assistance: Min guard Ambulation Distance (Feet): 350 Feet Assistive device:  (pushing IV pole) Gait Pattern/deviations: Step-through pattern;Decreased stride length   Gait velocity interpretation: Below normal speed for age/gender General Gait Details: Assist due to lines/tubes and for pt occasionally kicking IV pole.  Stairs             Wheelchair Mobility    Modified Rankin (Stroke Patients Only)       Balance Overall balance assessment: Needs assistance         Standing balance support: No upper extremity supported Standing balance-Leahy Scale: Good                               Pertinent Vitals/Pain HR to 130's with activity.    Home Living Family/patient expects to be discharged to:: Private residence   Available Help at Discharge: Friend(s);Available 24 hours/day Type of Home: House Home Access: Stairs to enter Entrance Stairs-Rails: None Entrance Stairs-Number of Steps: 1 Home Layout: One level        Prior Function Level of Independence: Independent         Comments: Pt reports amb at home without assistive device.     Hand Dominance   Dominant Hand: Right    Extremity/Trunk Assessment   Upper Extremity Assessment: Overall WFL for tasks assessed           Lower Extremity Assessment: Overall WFL for tasks assessed         Communication      Cognition Arousal/Alertness: Awake/alert Behavior During Therapy: Flat affect Overall Cognitive Status: No family/caregiver present to determine baseline cognitive functioning                      General Comments  Pt able to place himself on batteries.    Exercises  Assessment/Plan    PT Assessment Patient needs continued PT services  PT Diagnosis Difficulty walking   PT Problem List Decreased mobility  PT Treatment Interventions Gait training;Functional mobility training;Balance training;Therapeutic activities;Patient/family education;DME instruction   PT Goals (Current goals can be found in the Care Plan section) Acute Rehab PT Goals Patient Stated Goal: return home PT Goal Formulation: With patient Time For Goal Achievement: 11/11/13 Potential to Achieve Goals: Good    Frequency Min 3X/week   Barriers to discharge        Co-evaluation               End of Session  Equipment Utilized During Treatment: Other (comment) (no gait belt due to lines/tubes) Activity Tolerance: Patient tolerated treatment well Patient left: in chair;with call bell/phone within reach;with nursing/sitter in room Nurse Communication: Mobility status         Time: [id number] PT Time Calculation (min): 26 min   Charges:   PT Evaluation $Initial PT Evaluation Tier I: 1 Procedure PT Treatments $Gait Training: 8-22 mins   PT G CodesAngelina Ok:          Arvo Ealy W Neiva Maenza 11/04/2013, 1:49 PM  [employer] CorporationCary Izadora Roehr PT [phone]

## 2013-11-04 NOTE — Progress Notes (Signed)
ANTICOAGULATION CONSULT NOTE - Follow-up Consult  Pharmacy Consult for Heparin Indication: LVAD with sub-therapeutic INR  Allergies  Allergen Reactions  . Ace Inhibitors Cough  . Lexapro [Escitalopram Oxalate] Other (See Comments)    somnolence    Patient Measurements: Weight 86.2 kg on 11/03/13 Height: 5\' 4"  (162.6 cm) IBW/kg (Calculated) : 59.2 Heparin Dosing Weight: 86.2 kg  Vital Signs: Temp: 98.1 F (36.7 C) (05/14 0000) Temp src: Oral (05/14 0000) Pulse Rate: 111 (05/14 0300)  Labs:  Recent Labs  11/03/13 1229 11/03/13 1242 11/04/13 0235 11/04/13 0236  HGB  --  10.7*  --  11.7*  HCT  --  34.2*  --  36.0*  PLT  --  270  --  293  LABPROT 19.4*  --  20.7*  --   INR 1.69*  --  1.84*  --   HEPARINUNFRC  --   --  0.17*  --   CREATININE 1.08  --   --   --     The CrCl is unknown because both a height and weight (above a minimum accepted value) are required for this calculation.  Assessment: 52 YOM on chronic warfarin prior to admission for LVAD thrombosis s/p LVAD pump exchange 08/31/13 at [facility]. On heparin bridge for subtherapeutic INR. Heparin level 0.17 on gtt at 1250 units/hr. No bleeding noted.   Goal of Therapy:  INR 2-3 Heparin level 0.3-0.7 units/ml Monitor platelets by anticoagulation protocol: Yes   Plan:  1. Increase Heparin drip to 1400 units/hr 2. Heparin level in 6 hours.  Christoper Fabian, PharmD, BCPS Clinical pharmacist, pager [phone] 11/04/2013,3:11 AM

## 2013-11-04 NOTE — Progress Notes (Signed)
Heart Rate 127-143 and PI decreasing to 2.7. Patient asymptomatic.   Pump flow 5.7 Pump speed 9400 Pump Power 6.0  MD notified. Lopressor ordered and given. HR decreased 100-115 and PI maintaining 3.0-3.4.  Will continue to monitor. Call bell within reach.  Valinda Hoar RN

## 2013-11-04 NOTE — Progress Notes (Signed)
ANTICOAGULATION CONSULT NOTE - Follow up Consult  Pharmacy Consult for Heparin + Coumadin Indication: LVAD with subtherapeutic INR  Allergies  Allergen Reactions  . Ace Inhibitors Cough  . Lexapro [Escitalopram Oxalate] Other (See Comments)    somnolence    Patient Measurements: Weight 86.2 kg on 11/03/13 Height: 5\' 4"  (162.6 cm) Weight: 178 lb 5.6 oz (80.9 kg) IBW/kg (Calculated) : 59.2 Heparin Dosing Weight: 76kg  Vital Signs: Temp: 98.4 F (36.9 C) (05/14 1228) Temp src: Axillary (05/14 1228) Pulse Rate: 105 (05/14 1542)  Labs:  Recent Labs  11/03/13 1229 11/03/13 1242 11/04/13 0235 11/04/13 0236 11/04/13 0910 11/04/13 1456  HGB  --  10.7*  --  11.7*  --   --   HCT  --  34.2*  --  36.0*  --   --   PLT  --  270  --  293  --   --   LABPROT 19.4*  --  20.7*  --   --   --   INR 1.69*  --  1.84*  --   --   --   HEPARINUNFRC  --   --  0.17*  --  0.48 0.37  CREATININE 1.08  --  1.03  --   --   --     Estimated Creatinine Clearance: 82.4 ml/min (by C-G formula based on Cr of 1.03).   Medical History: Past Medical History  Diagnosis Date  . CHF (congestive heart failure)     EF- 10-15  . Medically noncompliant   . Mitral regurgitation   . Tobacco user   . HTN (hypertension)   . AICD (automatic cardioverter/defibrillator) present   . GERD (gastroesophageal reflux disease)   . Substance abuse   . Chronic renal insufficiency   . Syncope   . Thrombus 08/06/2010  . SYSTOLIC HEART FAILURE, CHRONIC 09/22/2008    Qualifier: Diagnosis of  By: Gala Romney, MD, Trixie Dredge   . LV (left ventricular) mural thrombus 01/28/2011  . ICD - IN SITU 09/16/2008    Qualifier: Diagnosis of  By: Wonda Amis    . MITRAL STENOSIS/ INSUFFICIENCY, NON-RHEUMATIC 09/22/2008    Qualifier: Diagnosis of  By: Gala Romney, MD, Trixie Dredge Hepatomegaly 09/16/2008    Qualifier: Diagnosis of  By: Wonda Amis    . High cholesterol 02/26/2012    "at one time"  . Sleep apnea    . Exertional dyspnea 02/26/2012  . History of blood transfusion 08/2011    "when I had heart pump"  . Migraines   . COMMON MIGRAINE 06/14/2009    Qualifier: Diagnosis of  By: Jonny Ruiz MD, Len Blalock   . History of gout 02/26/2012  . Depression 08/11/2013    Pt denies  . Bipolar affective disorder 10/22/2011    pt denies this hx 02/26/2012    Medications:  Heparin @ 1400 units/hr  Assessment: 52yom on chronic coumadin prior to admission for LVAD (initially implanted 09/2011 then s/p pump exchange 08/2013 at [facility]). He was admitted for increased weight, SOB, and LE edema.  INR subtherapeutic on admission and concern for hemolysis as LDH elevated and trending up. Heparin bridge started.  Heparin is therapeutic now x 2  Goal of Therapy:  INR 2-3 Heparin level 0.3-0.7 units/ml Monitor platelets by anticoagulation protocol: Yes   Plan:   1) Continue heparin at 1400 units/hr

## 2013-11-04 NOTE — Progress Notes (Signed)
HeartMate 2 Rounding Note  Primary Physician: Dr. Oliver BarreJames [name]  Primary Cardiologist: Dr. 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   He was admitted to [facility] January 2015 with NSTEMI and RV failure. He had a difficult course that was complicated by embolic right CVA. He was intubated and eventually had a tracheostomy placed 07/28/13. He was transferred to inpatient rehab for extensive rehab and was making progression, however despite aggressive therapy with heparin, coumadin, ASA and Plavix, his LDH remained persistently elevated in the 950 range (was about 1750 on admit). He did not experience any pump dysfunction, however the case was discussed with Dr Allena KatzPatel and Romona CurlsMilano at [facility] and it was felt to transfer him down there for possible pump exchange. He underwent a pump exchange 08/31/13. Post op was complicated by bleeding and he received multiple transfusions. He had AKI and had intermittent HD and underwent last HD on 09/24/13. Last Cr on chart 1.9 (10/02/13)  Patient presented to clinic today without scheduled visit because he reported his weight was trending up and needed lasix which he did not have. Pertinent labs today were Cr 1.08, K+ 3.0, INR 1.69, LDH 647 and pro-BNP 3033. Called EMS to transport patient to hospital.  Started on IV heparin and received IV lasix last night. 24 hr I/O -3.1 liters. Denies SOB, orthopnea, or CP. ST last night and received lopressor. MAPs remain elevated 98-110. Multiple doses of IV hydralazine. 2gm Magnesium last night.  LDH: 647>868 Cr 1.03 INR 1.84 Mag 1.6  LVAD INTERROGATION:  HeartMate II LVAD:  Flow 5.2 liters/min, speed 9600, power 5.9, PI 6.0.  No events  Objective:    Vital Signs:   Temp:  [97.4 F (36.3 C)-98.1 F (36.7 C)] 98.1 F (36.7 C) (05/14 0000) Pulse Rate:  [94-131] 110 (05/14  0600) Resp:  [12-31] 13 (05/14 0600) BP: (120)/(0) 120/0 mmHg (05/13 1245) SpO2:  [96 %-100 %] 99 % (05/14 0600) Weight:  [178 lb 5.6 oz (80.9 kg)-190 lb (86.183 kg)] 178 lb 5.6 oz (80.9 kg) (05/14 0500) Last BM Date: 11/02/13 Mean arterial Pressure 100-110  Intake/Output:   Intake/Output Summary (Last 24 hours) at 11/04/13 0655 Last data filed at 11/04/13 0600  Gross per 24 hour  Intake 424.29 ml  Output   3600 ml  Net -3175.71 ml     Physical Exam: General:  Well appearing. No resp difficulty HEENT: normal Neck: supple. JVP 7 . Carotids 2+ bilat; no bruits. No lymphadenopathy or thryomegaly appreciated. Cor: Mechanical heart sounds with LVAD hum present. Lungs: clear Abdomen: soft, nontender, nondistended. No hepatosplenomegaly. No bruits or masses. Good bowel sounds. Driveline: C/D/I; securement device intact and driveline incorporated Extremities: no cyanosis, clubbing, rash, 1+ edema, TED hose Neuro: alert & orientedx3, cranial nerves grossly intact. moves all 4 extremities w/o difficulty. Affect pleasant  Telemetry: SR/ST 100-110  Labs: Basic Metabolic Panel:  Recent Labs Lab 11/03/13 1229 11/03/13 1915 11/04/13 0235  NA 140  --  140  K 3.0*  --  3.8  CL 101  --  99  CO2 25  --  24  GLUCOSE 97  --  120*  BUN 6  --  6  CREATININE 1.08  --  1.03  CALCIUM 8.9  --  9.1  MG  --  1.6  --  Liver Function Tests: No results found for this basename: AST, ALT, ALKPHOS, BILITOT, PROT, ALBUMIN,  in the last 168 hours No results found for this basename: LIPASE, AMYLASE,  in the last 168 hours No results found for this basename: AMMONIA,  in the last 168 hours  CBC:  Recent Labs Lab 11/03/13 1242 11/04/13 0236  WBC 3.9* 6.2  HGB 10.7* 11.7*  HCT 34.2* 36.0*  MCV 80.5 79.5  PLT 270 293    INR:  Recent Labs Lab 11/03/13 1229 11/04/13 0235  INR 1.69* 1.84*    Imaging:  No results found.   Medications:     Scheduled Medications: . amiodarone   200 mg Oral Daily  . aspirin EC  325 mg Oral Daily  . furosemide  40 mg Intravenous BID  . hydrALAZINE  50 mg Oral 3 times per day  . isosorbide mononitrate  60 mg Oral Daily  . mirtazapine  30 mg Oral QHS  . pantoprazole  40 mg Oral Daily  . potassium chloride  20 mEq Oral BID  . sertraline  100 mg Oral Daily  . tamsulosin  0.4 mg Oral Daily  . Warfarin - Pharmacist Dosing Inpatient   Does not apply q1800     Infusions: . heparin 1,400 Units/hr (11/04/13 0317)     PRN Medications:  acetaminophen, acetaminophen, hydrALAZINE   Assessment:   1) Hemolysis  2) A/C systolic HF  3) NICM  - s/p LVAD (08/2011 and 08/2013)  4) CVA  5) NSVT  6) HTN  7) Hypokalemia   Plan/Discussion:    Stable overnight. Brisk diuresis with lasix 40 mg IV, 24 hr I/O -3.1 liters. Volume status much improved, will switch to PO lasix 40 mg daily today. Cr stable and will continue to follow.  LDH trending up concerning for pump thrombosis, however maybe he maybe dry. He is also having tachycardia. Will continue IV heparin and coumadin per pharmacy. Low threshold to add Plavix. Will need RAMP ECHO to assess for thrombosis and RV.  MAPs remain elevated will increase hydralazine to 75 mg TID and low dose ARB, losartan 25 mg daily. Cough with ACE-I.   Place consult for IR.  No BB with RV failure in the past.   Can transfer to step down.  I reviewed the LVAD parameters from today, and compared the results to the patient's prior recorded data.  No programming changes were made.  The LVAD is functioning within specified parameters.  The patient performs LVAD self-test daily.  LVAD interrogation was negative for any significant power changes, alarms or PI events/speed drops.  LVAD equipment check completed and is in good working order.  Back-up equipment present.   LVAD education done on emergency procedures and precautions and reviewed exit site care.  Length of Stay: 1  [name] 11/04/2013, 6:55  AM  VAD Team Pager [phone] (7am - 7am)  Patient seen and examined with [name], NP. We discussed all aspects of the encounter. I agree with the assessment and plan as stated above.   Diuresed well overnight. Volume status looks better. LDH continues to climb concerning for recurrent pump thrombosis. Continue IV heparin and warfarin with 2 day overlap. I am concerned that rhythm may be AFL. Will interrogate ICD. Switch diuretics to po.   Bevelyn Buckles Bensimhon,MD

## 2013-11-04 NOTE — Progress Notes (Signed)
Evening Rounds:  Nurse called about PI dropping from 6.2 to 2.6. Patient's blood pressure more controlled and now is 70.   Flow 5.3 Speed 9600 PI 2.6 Power 6.0  Patinen't MAPs were 115 this am and now more controlled. His HR is also increased 115-120s. He maybe a little dry. Encouraged him to drink fluids. Will hold PM hydralazine and told nurse to hold 6 am dose if MAP <80. He is having some dizziness. Turned VAD speed down to 9400 and back up speed to 8800. VAD interrogated and 1 PI event.   Flow 4.9 Speed 9400 PI 3.9 Power 5.7  Will continue to follow.   [name]  NP-C  6:07 PM

## 2013-11-05 LAB — BASIC METABOLIC PANEL
BUN: 6 mg/dL (ref 6–23)
CHLORIDE: 99 meq/L (ref 96–112)
CO2: 26 mEq/L (ref 19–32)
Calcium: 8.3 mg/dL — ABNORMAL LOW (ref 8.4–10.5)
Creatinine, Ser: 1.07 mg/dL (ref 0.50–1.35)
GFR, EST NON AFRICAN AMERICAN: 79 mL/min — AB (ref >90)
Glucose, Bld: 90 mg/dL (ref 70–99)
Potassium: 3 mEq/L — ABNORMAL LOW (ref 3.7–5.3)
SODIUM: 138 meq/L (ref 137–147)

## 2013-11-05 LAB — CBC
HCT: 29 % — ABNORMAL LOW (ref 39.0–52.0)
Hemoglobin: 9.2 g/dL — ABNORMAL LOW (ref 13.0–17.0)
MCH: 25.6 pg — ABNORMAL LOW (ref 26.0–34.0)
MCHC: 31.7 g/dL (ref 30.0–36.0)
MCV: 80.8 fL (ref 78.0–100.0)
PLATELETS: 250 10*3/uL (ref 150–400)
RBC: 3.59 MIL/uL — ABNORMAL LOW (ref 4.22–5.81)
RDW: 16.1 % — ABNORMAL HIGH (ref 11.5–15.5)
WBC: 5.5 10*3/uL (ref 4.0–10.5)

## 2013-11-05 LAB — LACTATE DEHYDROGENASE: LDH: 695 U/L — AB (ref 94–250)

## 2013-11-05 LAB — HEPARIN LEVEL (UNFRACTIONATED): Heparin Unfractionated: 0.39 IU/mL (ref 0.30–0.70)

## 2013-11-05 LAB — PROTIME-INR
INR: 2.04 — AB (ref 0.00–1.49)
PROTHROMBIN TIME: 22.4 s — AB (ref 11.6–15.2)

## 2013-11-05 MED ORDER — CARVEDILOL 3.125 MG PO TABS
3.1250 mg | ORAL_TABLET | Freq: Two times a day (BID) | ORAL | Status: DC
  Administered 2013-11-05 – 2013-11-08 (×5): 3.125 mg via ORAL
  Filled 2013-11-05 (×10): qty 1

## 2013-11-05 MED ORDER — TORSEMIDE 20 MG PO TABS
40.0000 mg | ORAL_TABLET | Freq: Every day | ORAL | Status: DC
  Administered 2013-11-05 – 2013-11-06 (×2): 40 mg via ORAL
  Filled 2013-11-05 (×2): qty 2

## 2013-11-05 MED ORDER — AMIODARONE IV BOLUS ONLY 150 MG/100ML
150.0000 mg | Freq: Once | INTRAVENOUS | Status: AC
  Administered 2013-11-05: 150 mg via INTRAVENOUS
  Filled 2013-11-05: qty 100

## 2013-11-05 MED ORDER — AMIODARONE HCL IN DEXTROSE 360-4.14 MG/200ML-% IV SOLN
60.0000 mg/h | INTRAVENOUS | Status: AC
  Administered 2013-11-05: 60 mg/h via INTRAVENOUS
  Filled 2013-11-05: qty 200

## 2013-11-05 MED ORDER — WARFARIN SODIUM 4 MG PO TABS
4.0000 mg | ORAL_TABLET | Freq: Once | ORAL | Status: AC
  Administered 2013-11-05: 4 mg via ORAL
  Filled 2013-11-05: qty 1

## 2013-11-05 MED ORDER — AMIODARONE HCL IN DEXTROSE 360-4.14 MG/200ML-% IV SOLN
30.0000 mg/h | INTRAVENOUS | Status: DC
  Administered 2013-11-06 (×3): 30 mg/h via INTRAVENOUS
  Filled 2013-11-05 (×7): qty 200

## 2013-11-05 MED ORDER — POTASSIUM CHLORIDE CRYS ER 20 MEQ PO TBCR
40.0000 meq | EXTENDED_RELEASE_TABLET | ORAL | Status: AC
  Administered 2013-11-05 (×2): 40 meq via ORAL
  Filled 2013-11-05 (×2): qty 2

## 2013-11-05 NOTE — Progress Notes (Signed)
LVAD alarmed that it was unplugged and power on wall unit was out. Back up charger kicked in. Patient asymptomatic. Cord to wall unit placed in different socket. Wall unit powered back on. Will continue to monitor patient.   Valinda Hoar RN

## 2013-11-05 NOTE — Progress Notes (Signed)
ANTICOAGULATION CONSULT NOTE - Follow up Consult  Pharmacy Consult for Heparin + Coumadin Indication: LVAD with subtherapeutic INR  Allergies  Allergen Reactions  . Ace Inhibitors Cough  . Lexapro [Escitalopram Oxalate] Other (See Comments)    somnolence    Patient Measurements: Weight 86.2 kg on 11/03/13 Height: 5\' 4"  (162.6 cm) Weight: 179 lb 10.8 oz (81.5 kg) IBW/kg (Calculated) : 59.2 Heparin Dosing Weight: 76kg  Vital Signs: Temp: 98.3 F (36.8 C) (05/15 0700) Pulse Rate: 112 (05/15 1043)  Labs:  Recent Labs  11/03/13 1229  11/03/13 1242  11/04/13 0235 11/04/13 0236 11/04/13 0910 11/04/13 1456 11/05/13 0319  HGB  --   < > 10.7*  --   --  11.7*  --   --  9.2*  HCT  --   --  34.2*  --   --  36.0*  --   --  29.0*  PLT  --   --  270  --   --  293  --   --  250  LABPROT 19.4*  --   --   --  20.7*  --   --   --  22.4*  INR 1.69*  --   --   --  1.84*  --   --   --  2.04*  HEPARINUNFRC  --   --   --   < > 0.17*  --  0.48 0.37 0.39  CREATININE 1.08  --   --   --  1.03  --   --   --  1.07  < > = values in this interval not displayed.  Estimated Creatinine Clearance: 79.6 ml/min (by C-G formula based on Cr of 1.07).  Medications:  Heparin @ 1400 units/hr  Assessment: 52yom on chronic coumadin prior to admission for LVAD (initially implanted 09/2011 then s/p pump exchange 08/2013 at [facility] [place] He was admitted for increased weight, SOB, and LE edema.  INR subtherapeutic on admission and concern for hemolysis as LDH elevated and trending up. Heparin bridge started.  Heparin level is therapeutic. INR is therapeutic. He is on amiodarone as PTA but may be switched to IV amiodarone today since he is in aflutter. May require a decrease in coumadin dose if this happens.  Home coumadin dose: 4mg  T/Th/Sat and 5mg  M/W/F/Sun  Dr. Gala Romney would like a 48 hour overlap with heparin and a therapeutic INR.  Goal of Therapy:  INR 2-3 Heparin level 0.3-0.7 units/ml Monitor platelets by  anticoagulation protocol: Yes   Plan:  1) Continue heparin at 1400 units/hr 2) Coumadin 4mg  x 1 3) Heparin level, INR, CBC in AM  Louie Casa, PharmD, BCPS 11/05/2013,11:32 AM

## 2013-11-05 NOTE — Progress Notes (Signed)
  Device interrogation suggests AFL. Will change po amio to IV.  Bevelyn Buckles Jimy Gates,MD 5:27 PM

## 2013-11-05 NOTE — Progress Notes (Signed)
HeartMate 2 Rounding Note  Primary Physician: Dr. Oliver BarreJames [name]  Primary Cardiologist: Dr. 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   He was admitted to [facility] January 2015 with NSTEMI and RV failure. He had a difficult course that was complicated by embolic right CVA. He was intubated and eventually had a tracheostomy placed 07/28/13. He was transferred to inpatient rehab for extensive rehab and was making progression, however despite aggressive therapy with heparin, coumadin, ASA and Plavix, his LDH remained persistently elevated in the 950 range (was about 1750 on admit). He did not experience any pump dysfunction, however the case was discussed with Dr Allena KatzPatel and Romona CurlsMilano [facility] and it was felt to transfer him down there for possible pump exchange. He underwent a pump exchange 08/31/13. Post op was complicated by bleeding and he received multiple transfusions. He had AKI and had intermittent HD and underwent last HD on 09/24/13. Last Cr on chart 1.9 (10/02/13)  Patient presented to clinic today without scheduled visit because he reported his weight was trending up and needed lasix which he did not have. Pertinent labs today were Cr 1.08, K+ 3.0, INR 1.69, LDH 647 and pro-BNP 3033. Called EMS to transport patient to hospital.  Started on IV heparin. Denies SOB, orthopnea, or CP. ST last night and received lopressor. MAPs much improved 80-98. Speed turned down to 9400 last night with drop in PI.    LDH: [id number]>695 Cr 1.03>1.07 INR 1.84>2.01   LVAD INTERROGATION:  HeartMate II LVAD:  Flow 5.1liters/min, speed 9400, power 5.7, PI 4.7. 1 PI event  Objective:    Vital Signs:   Temp:  [97.7 F (36.5 C)-98.4 F (36.9 C)] 98.3 F (36.8 C) (05/15 0700) Pulse Rate:  [95-127] 100 (05/15 0825) Resp:  [18-27] 18 (05/14 1300) SpO2:  [91 %-100 %] 95  % (05/15 0700) Weight:  [178 lb 5.6 oz (80.9 kg)-179 lb 10.8 oz (81.5 kg)] 179 lb 10.8 oz (81.5 kg) (05/15 0614) Last BM Date: 11/04/13 Mean arterial Pressure 100-110  Intake/Output:   Intake/Output Summary (Last 24 hours) at 11/05/13 0929 Last data filed at 11/05/13 0000  Gross per 24 hour  Intake    204 ml  Output      0 ml  Net    204 ml     Physical Exam: General:  Well appearing. No resp difficulty, lying in bed HEENT: normal Neck: supple. JVP 7-8 . Carotids 2+ bilat; no bruits. No lymphadenopathy or thryomegaly appreciated. Cor: Mechanical heart sounds with LVAD hum present. Lungs: clear Abdomen: soft, nontender, mildly distended. No hepatosplenomegaly. No bruits or masses. Good bowel sounds. Driveline: C/D/I; securement device intact and driveline incorporated Extremities: no cyanosis, clubbing, rash, 2+ edema,  Neuro: alert & orientedx3, cranial nerves grossly intact. moves all 4 extremities w/o difficulty. Affect pleasant  Telemetry: Atrial flutter ? 120s Labs: Basic Metabolic Panel:  Recent Labs Lab 11/03/13 1229 11/03/13 1915 11/04/13 0235 11/05/13 0319  NA 140  --  140 138  K 3.0*  --  3.8 3.0*  CL 101  --  99 99  CO2 25  --  24 26  GLUCOSE 97  --  120* 90  BUN 6  --  6 6  CREATININE 1.08  --  1.03 1.07  CALCIUM 8.9  --  9.1 8.3*  MG  --  1.6  --   --     Liver Function Tests: No results found for this basename: AST, ALT, ALKPHOS, BILITOT, PROT, ALBUMIN,  in the last 168 hours No results found for this basename: LIPASE, AMYLASE,  in the last 168 hours No results found for this basename: AMMONIA,  in the last 168 hours  CBC:  Recent Labs Lab 11/03/13 1242 11/04/13 0236 11/05/13 0319  WBC 3.9* 6.2 5.5  HGB 10.7* 11.7* 9.2*  HCT 34.2* 36.0* 29.0*  MCV 80.5 79.5 80.8  PLT 270 293 250    INR:  Recent Labs Lab 11/03/13 1229 11/04/13 0235 11/05/13 0319  INR 1.69* 1.84* 2.04*    Imaging: No results found.   Medications:      Scheduled Medications: . amiodarone  200 mg Oral Daily  . aspirin EC  325 mg Oral Daily  . hydrALAZINE  75 mg Oral 3 times per day  . isosorbide mononitrate  60 mg Oral Daily  . losartan  25 mg Oral Daily  . mirtazapine  30 mg Oral QHS  . pantoprazole  40 mg Oral Daily  . potassium chloride  20 mEq Oral Daily  . sertraline  100 mg Oral Daily  . tamsulosin  0.4 mg Oral Daily  . torsemide  40 mg Oral Daily  . Warfarin - Pharmacist Dosing Inpatient   Does not apply q1800    Infusions: . heparin 1,400 Units/hr (11/05/13 0643)    PRN Medications: acetaminophen, acetaminophen, hydrALAZINE   Assessment:   1) Hemolysis  2) A/C systolic HF  3) NICM  - s/p LVAD (08/2011 and 08/2013)  4) CVA  5) NSVT  6) HTN  7) Hypokalemia   Plan/Discussion:    Transferred to stepdown yesterday. MAPs much improved will continue hydralazine 75 mg TID, Imdur 60 mg daily and losartan 25 mg daily. Volume status still elevated will switch to 40 mg demadex daily.  LDH trending down and INR therapeutic. Will bridge with heparin for 48 hrs. Pharmacy dosing coumadin. Hgb trending down will continue to follow. No BRBPR.  His rhythm has been fast and I think maybe be atrial flutter, will have ICD interrogated. If atrial flutter will load with IV amiodarone. Start low dose coreg 3.125 mg BID, have to be careful with recent RV failure.  Cardiac rehab consult.   I reviewed the LVAD parameters from today, and compared the results to the patient's prior recorded data.  No programming changes were made.  The LVAD is functioning within specified parameters.  The patient performs LVAD self-test daily.  LVAD interrogation was negative for any significant power changes, alarms or PI events/speed drops.  LVAD equipment check completed and is in good working order.  Back-up equipment present.   LVAD education done on emergency procedures and precautions and reviewed exit site care.  Length of Stay: 2  [name] NP-C 11/05/2013, 9:29 AM  VAD Team Pager [phone] (7am - 7am)  Patient seen and examined with [name], NP. We discussed all aspects of the encounter. I agree with the assessment and plan as stated above.   LDH starting to come down. Continue heparin and coumadin with 2-day overlap once INR 2 or greater. Rhythm looks like AFL have asked [employer] to interrogate device. Start coreg. Will change lasix to demadex due to ongoing volume overload. VAD interrogated personally. Parameters look better after speed turned down.   Daniel R Bensimhon,MD 1:25 PM

## 2013-11-05 NOTE — Progress Notes (Signed)
Worked up pts chart however pt declined walking. Sts he is sleeping. Could not encourage pt to walk. PI is noted as 2.3, RN in room. Ethelda Chick CES, ACSM 2:37 PM 11/05/2013

## 2013-11-06 DIAGNOSIS — I5022 Chronic systolic (congestive) heart failure: Secondary | ICD-10-CM

## 2013-11-06 DIAGNOSIS — I4892 Unspecified atrial flutter: Secondary | ICD-10-CM

## 2013-11-06 DIAGNOSIS — Z95811 Presence of heart assist device: Secondary | ICD-10-CM

## 2013-11-06 DIAGNOSIS — D596 Hemoglobinuria due to hemolysis from other external causes: Secondary | ICD-10-CM

## 2013-11-06 LAB — CBC
HCT: 31.5 % — ABNORMAL LOW (ref 39.0–52.0)
Hemoglobin: 9.9 g/dL — ABNORMAL LOW (ref 13.0–17.0)
MCH: 25.2 pg — AB (ref 26.0–34.0)
MCHC: 31.4 g/dL (ref 30.0–36.0)
MCV: 80.2 fL (ref 78.0–100.0)
PLATELETS: 305 10*3/uL (ref 150–400)
RBC: 3.93 MIL/uL — ABNORMAL LOW (ref 4.22–5.81)
RDW: 16.2 % — AB (ref 11.5–15.5)
WBC: 4.8 10*3/uL (ref 4.0–10.5)

## 2013-11-06 LAB — BASIC METABOLIC PANEL
BUN: 7 mg/dL (ref 6–23)
CALCIUM: 8.6 mg/dL (ref 8.4–10.5)
CO2: 28 mEq/L (ref 19–32)
Chloride: 102 mEq/L (ref 96–112)
Creatinine, Ser: 1.22 mg/dL (ref 0.50–1.35)
GFR, EST AFRICAN AMERICAN: 78 mL/min — AB (ref >90)
GFR, EST NON AFRICAN AMERICAN: 68 mL/min — AB (ref >90)
GLUCOSE: 99 mg/dL (ref 70–99)
POTASSIUM: 3.6 meq/L — AB (ref 3.7–5.3)
SODIUM: 142 meq/L (ref 137–147)

## 2013-11-06 LAB — HEPARIN LEVEL (UNFRACTIONATED)
HEPARIN UNFRACTIONATED: 0.96 [IU]/mL — AB (ref 0.30–0.70)
Heparin Unfractionated: 0.81 IU/mL — ABNORMAL HIGH (ref 0.30–0.70)

## 2013-11-06 LAB — PROTIME-INR
INR: 2.21 — AB (ref 0.00–1.49)
PROTHROMBIN TIME: 23.8 s — AB (ref 11.6–15.2)

## 2013-11-06 LAB — LACTATE DEHYDROGENASE: LDH: 580 U/L — AB (ref 94–250)

## 2013-11-06 MED ORDER — HEPARIN (PORCINE) IN NACL 100-0.45 UNIT/ML-% IJ SOLN
1300.0000 [IU]/h | INTRAMUSCULAR | Status: DC
  Administered 2013-11-06: 1300 [IU]/h via INTRAVENOUS
  Filled 2013-11-06 (×3): qty 250

## 2013-11-06 MED ORDER — WARFARIN SODIUM 4 MG PO TABS
4.0000 mg | ORAL_TABLET | Freq: Once | ORAL | Status: AC
  Administered 2013-11-06: 4 mg via ORAL
  Filled 2013-11-06: qty 1

## 2013-11-06 MED ORDER — SODIUM CHLORIDE 0.9 % IV BOLUS (SEPSIS)
500.0000 mL | Freq: Once | INTRAVENOUS | Status: AC
  Administered 2013-11-06: 500 mL via INTRAVENOUS

## 2013-11-06 MED ORDER — HEPARIN (PORCINE) IN NACL 100-0.45 UNIT/ML-% IJ SOLN
1100.0000 [IU]/h | INTRAMUSCULAR | Status: DC
  Administered 2013-11-06: 1100 [IU]/h via INTRAVENOUS
  Filled 2013-11-06 (×2): qty 250

## 2013-11-06 NOTE — Progress Notes (Signed)
ANTICOAGULATION CONSULT NOTE - Follow up Consult  Pharmacy Consult for Heparin  Indication: LVAD with subtherapeutic INR  Allergies  Allergen Reactions  . Ace Inhibitors Cough  . Lexapro [Escitalopram Oxalate] Other (See Comments)    somnolence    Patient Measurements: Weight 86.2 kg on 11/03/13 Height: 5\' 4"  (162.6 cm) Weight: 178 lb (80.74 kg) IBW/kg (Calculated) : 59.2 Heparin Dosing Weight: 76kg  Vital Signs: Temp: 98.7 F (37.1 C) (05/16 1404) Pulse Rate: 97 (05/16 1121)  Labs:  Recent Labs  11/04/13 0235  11/04/13 0236  11/05/13 0319 11/06/13 0700 11/06/13 1819  HGB  --   < > 11.7*  --  9.2* 9.9*  --   HCT  --   --  36.0*  --  29.0* 31.5*  --   PLT  --   --  293  --  250 305  --   LABPROT 20.7*  --   --   --  22.4* 23.8*  --   INR 1.84*  --   --   --  2.04* 2.21*  --   HEPARINUNFRC 0.17*  --   --   < > 0.39 0.96* 0.81*  CREATININE 1.03  --   --   --  1.07 1.22  --   < > = values in this interval not displayed.  Estimated Creatinine Clearance: 69.5 ml/min (by C-G formula based on Cr of 1.22).  Medications:  Heparin @ 1300 units/hr  Assessment: 52yom on chronic coumadin prior to admission for LVAD (initially implanted 09/2011 then s/p pump exchange 08/2013 at [facility]). He was admitted for increased weight, SOB, and LE edema.  INR subtherapeutic on admission and concern for hemolysis as LDH elevated and trending up. Heparin bridge started. Heparin level remains supratherapeutic despite holding heparin and reducing dose.   Goal of Therapy:  INR 2-3 Heparin level 0.3-0.7 units/ml Monitor platelets by anticoagulation protocol: Yes   Plan:  1. Hold heparin gtt for 1 hours then restart at reduced rate of 1100 units/hr 2. Check an 8 hour level (AM labs) 3. If INR is therapeutic in AM, consider DC heparin  Lysle Pearl, PharmD, BCPS Pager # [phone] 11/06/2013 7:11 PM

## 2013-11-06 NOTE — Progress Notes (Signed)
Patient refused ambulation.  Encouraged and stressed importance in ambulation, but he still refused!  Instructed to walk with staff today and tomorrow.

## 2013-11-06 NOTE — Progress Notes (Addendum)
HeartMate 2 Rounding Note  Primary Physician: Dr. Oliver BarreJames [name]  Primary Cardiologist: Dr. 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   [facility] was [facility] with NSTEMI and RV failure. He had a difficult course that was complicated by embolic right CVA. He was intubated and eventually had a tracheostomy placed 07/28/13. He was transferred to inpatient rehab for extensive rehab and was making progression, however despite aggressive therapy with heparin, coumadin, ASA and Plavix, his LDH remained persistently elevated in the 950 range (was about 1750 on admit). He did not experience any pump dysfunction, however the case was discussed with Dr Allena KatzPatel and Romona CurlsMilano at [facility] and it was felt to transfer him down there for possible pump exchange. He underwent a pump exchange 08/31/13. Post op was complicated by bleeding and he received multiple transfusions. He had AKI and had intermittent HD and underwent last HD on 09/24/13. Last Cr on chart 1.9 (10/02/13)  Patient presented to clinic on 5/14 without scheduled visit because he reported his weight was trending up and needed lasix which he did not have. Pertinent labs were Cr 1.08, K+ 3.0, INR 1.69, LDH 647 and pro-BNP 3033. Called EMS to transport patient to hospital.  Feels good. 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 Po amio changed to IV. Now back in SR. HRs  90-105. Lasix changed to demadex and u/o improved. Weight down 1 pound.  LDH: 940 790 [id number]>868>695>580 Cr 1.03>1.07> 1.22 INR 1.84>2.01>2.21   LVAD INTERROGATION:  HeartMate II LVAD:  Flow 3.8 liters/min, speed 9400, power 5.0, PI 4.7. 6 PI events this am  Objective:    Vital Signs:   Temp:  [97.9 F (36.6 C)-98.6 F (37 C)] 97.9 F (36.6 C) (05/16 0500) Pulse Rate:  [95-103] 97 (05/16 1121) Weight:  [80.74 kg (178 lb)] 80.74  kg (178 lb) (05/16 0700) Last BM Date: 11/04/13 Mean arterial Pressure 100-110  Intake/Output:   Intake/Output Summary (Last 24 hours) at 11/06/13 1239 Last data filed at 11/06/13 0900  Gross per 24 hour  Intake 923.17 ml  Output   2350 ml  Net -1426.83 ml     Physical Exam: General:  Well appearing. No resp difficulty, lying in bed HEENT: normal Neck: supple. JVP 5 . Carotids 2+ bilat; no bruits. No lymphadenopathy or thryomegaly appreciated. Cor: Mechanical heart sounds with LVAD hum present. Lungs: clear Abdomen: soft, nontender, mildly distended. No hepatosplenomegaly. No bruits or masses. Good bowel sounds. Driveline: C/D/I; securement device intact and driveline incorporated Extremities: no cyanosis, clubbing, rash, no edema,  Neuro: alert & orientedx3, cranial nerves grossly intact. moves all 4 extremities w/o difficulty. Affect pleasant  Telemetry: SR 90s  Labs: Basic Metabolic Panel:  Recent Labs Lab 11/03/13 1229 11/03/13 1915 11/04/13 0235 11/05/13 0319 11/06/13 0700  NA 140  --  140 138 142  K 3.0*  --  3.8 3.0* 3.6*  CL 101  --  99 99 102  CO2 25  --  24 26 28   GLUCOSE 97  --  120* 90 99  BUN 6  --  6 6 7   CREATININE 1.08  --  1.03 1.07 1.22  CALCIUM 8.9  --  9.1 8.3* 8.6  MG  --  1.6  --   --   --     Liver Function Tests: No results  found for this basename: AST, ALT, ALKPHOS, BILITOT, PROT, ALBUMIN,  in the last 168 hours No results found for this basename: LIPASE, AMYLASE,  in the last 168 hours No results found for this basename: AMMONIA,  in the last 168 hours  CBC:  Recent Labs Lab 11/03/13 1242 11/04/13 0236 11/05/13 0319 11/06/13 0700  WBC 3.9* 6.2 5.5 4.8  HGB 10.7* 11.7* 9.2* 9.9*  HCT 34.2* 36.0* 29.0* 31.5*  MCV 80.5 79.5 80.8 80.2  PLT 270 293 250 305    INR:  Recent Labs Lab 11/03/13 1229 11/04/13 0235 11/05/13 0319 11/06/13 0700  INR 1.69* 1.84* 2.04* 2.21*    Imaging: No results found.   Medications:      Scheduled Medications: . aspirin EC  325 mg Oral Daily  . carvedilol  3.125 mg Oral BID WC  . hydrALAZINE  75 mg Oral 3 times per day  . isosorbide mononitrate  60 mg Oral Daily  . losartan  25 mg Oral Daily  . mirtazapine  30 mg Oral QHS  . pantoprazole  40 mg Oral Daily  . potassium chloride  20 mEq Oral Daily  . sertraline  100 mg Oral Daily  . tamsulosin  0.4 mg Oral Daily  . torsemide  40 mg Oral Daily  . warfarin  4 mg Oral ONCE-1800  . Warfarin - Pharmacist Dosing Inpatient   Does not apply q1800    Infusions: . amiodarone 30 mg/hr (11/06/13 0359)  . heparin 1,300 Units/hr (11/06/13 1208)    PRN Medications: acetaminophen, acetaminophen, hydrALAZINE   Assessment:   1) Hemolysis  2) A/C systolic HF  3) NICM  - s/p LVAD (08/2011 and 08/2013)  4) CVA  5) NSVT  6) HTN  7) Hypokalemia 8) AFL   Plan/Discussion:     LDH continues to come down. Continue heparin and coumadin with overlap for 1 more day. ICD interrogation yesterday reviewed rhythm looks like AFL. Amio changed to IV. Now back in SR. Will continue IV amio one more day. Continue coreg.  VAD interrogated personally. Multiple PI events and PI down. Likely dry. Will stop demadex.   Possibly home in am   Eric Buckles Kymberlee Viger,MD 12:39 PM

## 2013-11-06 NOTE — Progress Notes (Addendum)
Nursing note Patient MAP 64 patient asymptomatic, pump flow 3.3-3.8 (however with minuses on screen this evening) PI now down to 2.4 (had been 4.9 earlier today). Dr. Gala Romney made aware orders received. 500 ML NS bolus given as ordered Will continue to closely monitor patient. Randall An Elmire Amrein RN

## 2013-11-06 NOTE — Progress Notes (Signed)
ANTICOAGULATION CONSULT NOTE - Follow up Consult  Pharmacy Consult for Heparin + Coumadin Indication: LVAD with subtherapeutic INR  Allergies  Allergen Reactions  . Ace Inhibitors Cough  . Lexapro [Escitalopram Oxalate] Other (See Comments)    somnolence    Patient Measurements: Weight 86.2 kg on 11/03/13 Height: 5\' 4"  (162.6 cm) Weight: 178 lb (80.74 kg) IBW/kg (Calculated) : 59.2 Heparin Dosing Weight: 76kg  Vital Signs: Temp: 97.9 F (36.6 C) (05/16 0500) Temp src: Oral (05/16 0500) Pulse Rate: 97 (05/16 1121)  Labs:  Recent Labs  11/04/13 0235 11/04/13 0236  11/04/13 1456 11/05/13 0319 11/06/13 0700  HGB  --  11.7*  --   --  9.2* 9.9*  HCT  --  36.0*  --   --  29.0* 31.5*  PLT  --  293  --   --  250 305  LABPROT 20.7*  --   --   --  22.4* 23.8*  INR 1.84*  --   --   --  2.04* 2.21*  HEPARINUNFRC 0.17*  --   < > 0.37 0.39 0.96*  CREATININE 1.03  --   --   --  1.07 1.22  < > = values in this interval not displayed.  Estimated Creatinine Clearance: 69.5 ml/min (by C-G formula based on Cr of 1.22).  Medications:  Heparin @ 1400 units/hr  Assessment: 52yom on chronic coumadin prior to admission for LVAD (initially implanted 09/2011 then s/p pump exchange 08/2013 at [facility]). He was admitted for increased weight, SOB, and LE edema.  INR subtherapeutic on admission and concern for hemolysis as LDH elevated and trending up. Heparin bridge started.  Heparin level is supratherapeutic. INR is therapeutic at 2.2. He is on amiodarone as PTA but may be switched to IV amiodarone today since he is in aflutter. May require a decrease in coumadin dose if this happens. LDH is trending down to 580.D/w Bensimhon and will continue bridge for another 24h  Home coumadin dose: 4mg  T/Th/Sat and 5mg  M/W/F/Sun  Goal of Therapy:  INR 2-3 Heparin level 0.3-0.7 units/ml Monitor platelets by anticoagulation protocol: Yes   Plan:  1) Hold heparin for 87m then decrease to 1300 units/hr 2)  Coumadin 4mg  x 1 3) Heparin level, INR, CBC in AM  Sheppard Coil PharmD., BCPS Clinical Pharmacist Pager [phone] 11/06/2013 11:37 AM

## 2013-11-07 DIAGNOSIS — I4892 Unspecified atrial flutter: Secondary | ICD-10-CM

## 2013-11-07 LAB — CBC
HEMATOCRIT: 28.3 % — AB (ref 39.0–52.0)
Hemoglobin: 8.9 g/dL — ABNORMAL LOW (ref 13.0–17.0)
MCH: 25.4 pg — AB (ref 26.0–34.0)
MCHC: 31.4 g/dL (ref 30.0–36.0)
MCV: 80.9 fL (ref 78.0–100.0)
Platelets: 269 10*3/uL (ref 150–400)
RBC: 3.5 MIL/uL — ABNORMAL LOW (ref 4.22–5.81)
RDW: 16.2 % — AB (ref 11.5–15.5)
WBC: 4.7 10*3/uL (ref 4.0–10.5)

## 2013-11-07 LAB — BASIC METABOLIC PANEL
BUN: 9 mg/dL (ref 6–23)
CALCIUM: 8.1 mg/dL — AB (ref 8.4–10.5)
CHLORIDE: 102 meq/L (ref 96–112)
CO2: 27 mEq/L (ref 19–32)
Creatinine, Ser: 1.28 mg/dL (ref 0.50–1.35)
GFR calc Af Amer: 74 mL/min — ABNORMAL LOW (ref >90)
GFR calc non Af Amer: 64 mL/min — ABNORMAL LOW (ref >90)
Glucose, Bld: 99 mg/dL (ref 70–99)
Potassium: 3.5 mEq/L — ABNORMAL LOW (ref 3.7–5.3)
Sodium: 142 mEq/L (ref 137–147)

## 2013-11-07 LAB — PROTIME-INR
INR: 2.37 — ABNORMAL HIGH (ref 0.00–1.49)
Prothrombin Time: 25.1 seconds — ABNORMAL HIGH (ref 11.6–15.2)

## 2013-11-07 LAB — LACTATE DEHYDROGENASE: LDH: 572 U/L — ABNORMAL HIGH (ref 94–250)

## 2013-11-07 LAB — HEPARIN LEVEL (UNFRACTIONATED): Heparin Unfractionated: 0.45 IU/mL (ref 0.30–0.70)

## 2013-11-07 MED ORDER — SODIUM CHLORIDE 0.9 % IV BOLUS (SEPSIS)
500.0000 mL | Freq: Once | INTRAVENOUS | Status: AC
  Administered 2013-11-07: 500 mL via INTRAVENOUS

## 2013-11-07 MED ORDER — WARFARIN SODIUM 4 MG PO TABS
4.0000 mg | ORAL_TABLET | Freq: Once | ORAL | Status: AC
  Administered 2013-11-07: 4 mg via ORAL
  Filled 2013-11-07: qty 1

## 2013-11-07 MED ORDER — AMIODARONE HCL 200 MG PO TABS
400.0000 mg | ORAL_TABLET | Freq: Two times a day (BID) | ORAL | Status: DC
  Administered 2013-11-07 (×2): 400 mg via ORAL
  Filled 2013-11-07 (×5): qty 2

## 2013-11-07 NOTE — Plan of Care (Signed)
Problem: Phase III Progression Outcomes Goal: Activity at appropriate level-compared to baseline (UP IN CHAIR FOR HEMODIALYSIS)  Outcome: Not Progressing Patient refusing to ambulate. Randall An Jonia Oakey RN

## 2013-11-07 NOTE — Progress Notes (Signed)
ANTICOAGULATION CONSULT NOTE - Follow up Consult  Pharmacy Consult for Heparin  Indication: LVAD with subtherapeutic INR  Allergies  Allergen Reactions  . Ace Inhibitors Cough  . Lexapro [Escitalopram Oxalate] Other (See Comments)    somnolence    Patient Measurements: Weight 86.2 kg on 11/03/13 Height: 5\' 4"  (162.6 cm) Weight: 177 lb 6.4 oz (80.468 kg) IBW/kg (Calculated) : 59.2 Heparin Dosing Weight: 76kg  Vital Signs: Temp: 98.3 F (36.8 C) (05/17 0404) Temp src: Oral (05/17 0404) Pulse Rate: 93 (05/17 0404)  Labs:  Recent Labs  11/05/13 0319 11/06/13 0700 11/06/13 1819 11/07/13 0723  HGB 9.2* 9.9*  --  8.9*  HCT 29.0* 31.5*  --  28.3*  PLT 250 305  --  269  LABPROT 22.4* 23.8*  --  25.1*  INR 2.04* 2.21*  --  2.37*  HEPARINUNFRC 0.39 0.96* 0.81* 0.45  CREATININE 1.07 1.22  --  1.28    Estimated Creatinine Clearance: 66.1 ml/min (by C-G formula based on Cr of 1.28).  Medications:  Heparin @ 1300 units/hr  Assessment: 52yom on chronic coumadin prior to admission for LVAD (initially implanted 09/2011 then s/p pump exchange 08/2013 at [facility]). He was admitted for increased weight, SOB, and LE edema.  INR subtherapeutic on admission and concern for hemolysis as LDH elevated and trending up. Heparin bridge started.  Heparin levels now at goal after adjustments yesterday. INR continues to be within range at 2.3. LDH trending down to 572. Will d/w Bensimhon and likely be able to d/c heparin today.  Goal of Therapy:  INR 2-3 Heparin level 0.3-0.7 units/ml Monitor platelets by anticoagulation protocol: Yes   Plan:  1. Continue heparin at 1100 units/hr 2. Warfarin 4mg  again tonight  Sheppard Coil PharmD., BCPS Clinical Pharmacist Pager [phone] 11/07/2013 10:39 AM

## 2013-11-07 NOTE — Progress Notes (Signed)
HeartMate 2 Rounding Note  Primary Physician: Dr. Oliver BarreJames [name]  Primary Cardiologist: Dr. 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   [facility] with NSTEMI and RV failure. He had a difficult course that was complicated by embolic right CVA. He was intubated and eventually had a tracheostomy placed 07/28/13. He was transferred to inpatient rehab for extensive rehab and was making progression, however despite aggressive therapy with heparin, coumadin, ASA and Plavix, his LDH remained persistently elevated in the 950 range (was about 1750 on admit). He did not experience any pump dysfunction, however the case was discussed with Dr Allena KatzPatel and [facility]Milano [facility] and it was felt to transfer him down there for possible pump exchange. He underwent a pump exchange 08/31/13. Post op was complicated by bleeding and he received multiple transfusions. He had AKI and had intermittent HD and underwent last HD on 09/24/13. Last Cr on chart 1.9 (10/02/13)  Patient presented to clinic on 5/14 without scheduled visit because he reported his weight was trending up and needed lasix which he did not have. Pertinent labs were Cr 1.08, K+ 3.0, INR 1.69, LDH 647 and pro-BNP 3033. Called EMS to transport patient to hospital.  IV amio started on 5/15 and converted to NSR. Yesterday PI was low and given 500cc saline. Diuretics held. Still feels weak. Occasionally lightheaded.   LDH: 647>868>695>580> pending Cr 1.03>1.07> 1.22 INR 1.84>2.01>2.21>pending   LVAD INTERROGATION:  HeartMate II LVAD:  Flow 3.6 liters/min, speed 9400, power 5.0, PI 4.2. Multiple PI events this am  Objective:    Vital Signs:   Temp:  [98.3 F (36.8 C)-98.7 F (37.1 C)] 98.3 F (36.8 C) (05/17 0404) Pulse Rate:  [93-97] 93 (05/17 0404) SpO2:  [91 %-93 %] 91 % (05/17  0404) Weight:  [80.468 kg (177 lb 6.4 oz)-80.74 kg (178 lb)] 80.468 kg (177 lb 6.4 oz) (05/17 0519) Last BM Date: 11/04/13 Mean arterial Pressure 80s  Intake/Output:   Intake/Output Summary (Last 24 hours) at 11/07/13 0525 Last data filed at 11/06/13 1800  Gross per 24 hour  Intake 843.53 ml  Output   1850 ml  Net -1006.47 ml     Physical Exam: General:  Weak appearing. No resp difficulty, lying in bed HEENT: normal Neck: supple. JVP 5 . Carotids 2+ bilat; no bruits. No lymphadenopathy or thryomegaly appreciated. Cor: Mechanical heart sounds with LVAD hum present. Lungs: clear Abdomen: soft, nontender, mildly distended. No hepatosplenomegaly. No bruits or masses. Good bowel sounds. Driveline: C/D/I; securement device intact and driveline incorporated Extremities: no cyanosis, clubbing, rash, tr edema,  Neuro: alert & orientedx3, cranial nerves grossly intact. moves all 4 extremities w/o difficulty. Affect pleasant  Telemetry: SR 90s with occasional PVCs.  Labs: Basic Metabolic Panel:  Recent Labs Lab 11/03/13 1229 11/03/13 1915 11/04/13 0235 11/05/13 0319 11/06/13 0700  NA 140  --  140 138 142  K 3.0*  --  3.8 3.0* 3.6*  CL 101  --  99 99 102  CO2 25  --  24 26 28   GLUCOSE 97  --  120* 90 99  BUN 6  --  6 6 7   CREATININE 1.08  --  1.03 1.07 1.22  CALCIUM 8.9  --  9.1 8.3* 8.6  MG  --  1.6  --   --   --  Liver Function Tests: No results found for this basename: AST, ALT, ALKPHOS, BILITOT, PROT, ALBUMIN,  in the last 168 hours No results found for this basename: LIPASE, AMYLASE,  in the last 168 hours No results found for this basename: AMMONIA,  in the last 168 hours  CBC:  Recent Labs Lab 11/03/13 1242 11/04/13 0236 11/05/13 0319 11/06/13 0700  WBC 3.9* 6.2 5.5 4.8  HGB 10.7* 11.7* 9.2* 9.9*  HCT 34.2* 36.0* 29.0* 31.5*  MCV 80.5 79.5 80.8 80.2  PLT 270 293 250 305    INR:  Recent Labs Lab 11/03/13 1229 11/04/13 0235 11/05/13 0319  11/06/13 0700  INR 1.69* 1.84* 2.04* 2.21*    Imaging: No results found.   Medications:     Scheduled Medications: . aspirin EC  325 mg Oral Daily  . carvedilol  3.125 mg Oral BID WC  . hydrALAZINE  75 mg Oral 3 times per day  . isosorbide mononitrate  60 mg Oral Daily  . losartan  25 mg Oral Daily  . mirtazapine  30 mg Oral QHS  . pantoprazole  40 mg Oral Daily  . potassium chloride  20 mEq Oral Daily  . sertraline  100 mg Oral Daily  . tamsulosin  0.4 mg Oral Daily  . Warfarin - Pharmacist Dosing Inpatient   Does not apply q1800    Infusions: . amiodarone 30 mg/hr (11/06/13 2347)  . heparin 1,100 Units/hr (11/06/13 2106)    PRN Medications: acetaminophen, acetaminophen, hydrALAZINE   Assessment:   1) Hemolysis  2) A/C systolic HF  3) NICM  - s/p LVAD (08/2011 and 08/2013)  4) CVA  5) NSVT  6) HTN  7) Hypokalemia 8) AFL   Plan/Discussion:    Feels weak. Multiple PI events and PI down. Likely dry. Give another 500cc. May need to turn VAD speed down again.   Await labs this am. If LDH down and INR > 2.0. will stop heparin.   Switch IV amio to po.   Hopefully home in am   Bevelyn Buckles Kijana Cromie,MD 5:25 AM

## 2013-11-07 NOTE — Progress Notes (Signed)
Nursing note Patient encouraged to ambulate, declines at this time will continue to encourage ambulation.Randall An Taytem Ghattas RN

## 2013-11-07 NOTE — Plan of Care (Signed)
Problem: Phase II Progression Outcomes Goal: Walk in hall or up in chair TID Outcome: Not Progressing Variance: Patient refused Comments: Patient doesn't wish to ambulate. Patient understands the reasons for getting up and walking.

## 2013-11-07 NOTE — Progress Notes (Signed)
Patients IV disconnected from hub. IV reconnected , IV dressings changed, patient bathed and began complaining of lightheadedness while sitting up after 10 - 15 minutes. Patient unable to keep still long enough to get map while sitting. MAP 72 once patient laying down. LVAD flow 4.0,  Index 3.0, speed  9399, and power 5.0  while sitting charted on VAD pathway.

## 2013-11-08 LAB — CBC
HEMATOCRIT: 29.9 % — AB (ref 39.0–52.0)
Hemoglobin: 9.4 g/dL — ABNORMAL LOW (ref 13.0–17.0)
MCH: 25 pg — AB (ref 26.0–34.0)
MCHC: 31.4 g/dL (ref 30.0–36.0)
MCV: 79.5 fL (ref 78.0–100.0)
Platelets: 310 10*3/uL (ref 150–400)
RBC: 3.76 MIL/uL — AB (ref 4.22–5.81)
RDW: 16.5 % — ABNORMAL HIGH (ref 11.5–15.5)
WBC: 4.3 10*3/uL (ref 4.0–10.5)

## 2013-11-08 LAB — LACTATE DEHYDROGENASE: LDH: 530 U/L — AB (ref 94–250)

## 2013-11-08 LAB — BASIC METABOLIC PANEL
BUN: 9 mg/dL (ref 6–23)
CHLORIDE: 100 meq/L (ref 96–112)
CO2: 26 meq/L (ref 19–32)
CREATININE: 1.22 mg/dL (ref 0.50–1.35)
Calcium: 8.4 mg/dL (ref 8.4–10.5)
GFR calc Af Amer: 78 mL/min — ABNORMAL LOW (ref >90)
GFR calc non Af Amer: 68 mL/min — ABNORMAL LOW (ref >90)
Glucose, Bld: 87 mg/dL (ref 70–99)
Potassium: 3.5 mEq/L — ABNORMAL LOW (ref 3.7–5.3)
Sodium: 140 mEq/L (ref 137–147)

## 2013-11-08 LAB — PROTIME-INR
INR: 2.43 — ABNORMAL HIGH (ref 0.00–1.49)
Prothrombin Time: 25.6 seconds — ABNORMAL HIGH (ref 11.6–15.2)

## 2013-11-08 MED ORDER — LOSARTAN POTASSIUM 50 MG PO TABS: 50.0000 mg | ORAL_TABLET | Freq: Every day | ORAL | Status: DC

## 2013-11-08 MED ORDER — POTASSIUM CHLORIDE CRYS ER 20 MEQ PO TBCR: 20.0000 meq | EXTENDED_RELEASE_TABLET | Freq: Every day | ORAL | Status: DC

## 2013-11-08 MED ORDER — WARFARIN SODIUM 5 MG PO TABS: 5.0000 mg | ORAL_TABLET | Freq: Every day | ORAL | Status: DC

## 2013-11-08 MED ORDER — AMIODARONE HCL 200 MG PO TABS: 200.0000 mg | ORAL_TABLET | Freq: Two times a day (BID) | ORAL | Status: DC

## 2013-11-08 MED ORDER — AMIODARONE HCL 200 MG PO TABS
200.0000 mg | ORAL_TABLET | Freq: Two times a day (BID) | ORAL | Status: DC
  Administered 2013-11-08: 200 mg via ORAL
  Filled 2013-11-08 (×2): qty 1

## 2013-11-08 MED ORDER — CARVEDILOL 3.125 MG PO TABS: 3.1250 mg | ORAL_TABLET | Freq: Two times a day (BID) | ORAL | Status: DC

## 2013-11-08 MED ORDER — LOSARTAN POTASSIUM 50 MG PO TABS
50.0000 mg | ORAL_TABLET | Freq: Every day | ORAL | Status: DC
  Filled 2013-11-08: qty 1

## 2013-11-08 MED ORDER — ASPIRIN 325 MG PO TBEC: 325.0000 mg | DELAYED_RELEASE_TABLET | Freq: Every day | ORAL | Status: DC

## 2013-11-08 MED ORDER — WARFARIN SODIUM 4 MG PO TABS
4.0000 mg | ORAL_TABLET | Freq: Once | ORAL | Status: DC
  Filled 2013-11-08: qty 1

## 2013-11-08 NOTE — Discharge Instructions (Signed)
Heart Failure Heart failure means your heart has trouble pumping blood. This makes it hard for your body to work well. Heart failure is usually a long-term (chronic) condition. You must take good care of yourself and follow your doctor's treatment plan. HOME CARE  Take your heart medicine as told by your doctor.  Do not stop taking medicine unless your doctor tells you to.  Do not skip any dose of medicine.  Refill your medicines before they run out.  Take other medicines only as told by your doctor or pharmacist.  Stay active if told by your doctor. The elderly and people with severe heart failure should talk with a doctor about physical activity.  Eat heart healthy foods. Choose foods that are without trans fat and are low in saturated fat, cholesterol, and salt (sodium). This includes fresh or frozen fruits and vegetables, fish, lean meats, fat-free or low-fat dairy foods, whole grains, and high-fiber foods. Lentils and dried peas and beans (legumes) are also good choices.  Limit salt if told by your doctor.  Cook in a healthy way. Roast, grill, broil, bake, poach, steam, or stir-fry foods.  Limit fluids as told by your doctor.  Weigh yourself every morning. Do this after you pee (urinate) and before you eat breakfast. Write down your weight to give to your doctor.  Take your blood pressure and write it down if your doctor tell you to.  Ask your doctor how to check your pulse. Check your pulse as told.  Lose weight if told by your doctor.  Stop smoking or chewing tobacco. Do not use gum or patches that help you quit without your doctor's approval.  Schedule and go to doctor visits as told.  Nonpregnant women should have no more than 1 drink a day. Men should have no more than 2 drinks a day. Talk to your doctor about drinking alcohol.  Stop illegal drug use.  Stay current with shots (immunizations).  Manage your health conditions as told by your doctor.  Learn to manage  your stress.  Rest when you are tired.  If it is really hot outside:  Avoid intense activities.  Use air conditioning or fans, or get in a cooler place.  Avoid caffeine and alcohol.  Wear loose-fitting, lightweight, and light-colored clothing.  If it is really cold outside:  Avoid intense activities.  Layer your clothing.  Wear mittens or gloves, a hat, and a scarf when going outside.  Avoid alcohol.  Learn about heart failure and get support as needed.  Get help to maintain or improve your quality of life and your ability to care for yourself as needed. GET HELP IF:   You gain 03 lb/1.4 kg or more in 1 day or 05 lb/2.3 kg in a week.  You are more short of breath than usual.  You cannot do your normal activities.  You tire easily.  You cough more than normal, especially with activity.  You have any or more puffiness (swelling) in areas such as your hands, feet, ankles, or belly (abdomen).  You cannot sleep because it is hard to breathe.  You feel like your heart is beating fast (palpitations).  You get dizzy or lightheaded when you stand up. GET HELP RIGHT AWAY IF:   You have trouble breathing.  There is a change in mental status, such as becoming less alert or not being able to focus.  You have chest pain or discomfort.  You faint. MAKE SURE YOU:   Understand these   instructions.  Will watch your condition.  Will get help right away if you are not doing well or get worse. Document Released: 03/19/2008 Document Revised: 10/05/2012 Document Reviewed: 01/09/2012 Exit[employer] Patient Information 2014 [employer].  

## 2013-11-08 NOTE — Discharge Summary (Signed)
Advanced Heart Failure Team  Discharge Summary   Patient ID: [name] MRN: [id number], DOB/AGE: Dec 31, 1960 53 y.o. 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 He was last admitted to [facility] January 2015 with NSTEMI and RV failure. His hospital course was complicated by an embolic right CVA. He was intubated and eventually had a tracheostomy placed 07/28/13. He was transferred to inpatient rehab for extensive rehab and was making progression, however despite aggressive therapy with heparin, coumadin, ASA and Plavix, his LDH remained persistently elevated in the 950 range (was about 1750 on admit). He did not experience any pump dysfunction, however the case was discussed with Dr Allena Katz and  Dr [name] at [facility] and he was transferred there for pump exchange. He underwent a pump exchange 08/31/13. His post operative course was complicated by  bleeding and he received multiple transfusions. He had renal failure and requiredintermittent HD and underwent last HD on 09/24/13. Last Cr on chart 1.9 (10/02/13)   He presented to the HF clinic May 14 for an unscheduled visit because he reported his weight was trending up and needed lasix because he was out of lasix. He had lab work and went home but based on elevated LDH 647 and low  INR 1.8, EMS was contacted for transport to hospital.   On admit, he was tachycardic and ICD interrogating confirmed A-Flutter. He was placed on IV amiodarone for rate control and chemically converted to NSR.  He was transitioned to  amiodarone 200 mg twice a day. Diuretics were reduced due to increased PI events which resulted in his LVAD speed being cut back to 9400. He was later discharged on lasix 20 mg daily. LDH continued to trend down and on the day of discharge LDH was down to 530. He was on Heparin and coumadin until INR was therapeutic. INR was followed closely and at the time discharge INR was 2.4. Due to ongoing difficulty with medications, coumadin was changed to 5 mg daily.    Lengthy discussions occurred regarding medication compliance. [facility] will continue to follow closely and verify medications at every visit. Plan for [facility] to check INR Friday.  He continue to be followed closely in the HF clinic with follow up next week.   LVAD INTERROGATION:  HeartMate II LVAD: Flow 3.6 2 liters/min, speed 9400, power 4.8, PI 6.2 . Multiple PI events this am    Discharge Weight Range: Discharge weight 176 pounds.  Discharge Vitals: Blood pressure 78/0, pulse 95, temperature 98.9 F (37.2 C), temperature source Oral, resp. rate 18, height 5\' 4"  (1.626 m), weight 176 lb 9.6 oz (80.105 kg), SpO2 93.00%.  Labs: Lab Results  Component Value Date   WBC 4.3 11/08/2013   HGB 9.4* 11/08/2013   HCT 29.9* 11/08/2013   MCV 79.5 11/08/2013   PLT 310 11/08/2013    Recent Labs Lab 11/08/13 0340  NA 140  K 3.5*  CL 100  CO2 26  BUN 9  CREATININE 1.22  CALCIUM 8.4  GLUCOSE 87   Lab Results  Component Value Date   CHOL 166 07/20/2013   HDL 63 07/20/2013   LDLCALC 84 07/20/2013   TRIG 96 07/20/2013   BNP (last 3 results)  Recent Labs  07/17/13 0923 11/03/13 1229  PROBNP 2538.0* 3033.0*    Diagnostic Studies/Procedures   No results found.  Discharge Medications     Medication List    STOP taking these medications       aspirin 81 MG chewable tablet  Replaced by:  aspirin 325 MG EC tablet     hydrALAZINE 50 MG tablet  Commonly known as:  APRESOLINE      TAKE these medications       amiodarone  200 MG tablet  Commonly known as:  PACERONE  Take 1 tablet (200 mg total) by mouth 2 (two) times daily.     aspirin 325 MG EC tablet  Take 1 tablet (325 mg total) by mouth daily.     carvedilol 3.125 MG tablet  Commonly known as:  COREG  Take 1 tablet (3.125 mg total) by mouth 2 (two) times daily with a meal.     furosemide 20 MG tablet  Commonly known as:  LASIX  Take 1 tablet (20 mg total) by mouth daily.     losartan 50 MG tablet  Commonly known as:  COZAAR  Take 1 tablet (50 mg total) by mouth daily.     mirtazapine 30 MG tablet  Commonly known as:  REMERON  Take 30 mg by mouth at bedtime.     pantoprazole 40 MG tablet  Commonly known as:  PROTONIX  Take 40 mg by mouth daily.     potassium chloride SA 20 MEQ tablet  Commonly known as:  K-DUR,KLOR-CON  Take 1 tablet (20 mEq total) by mouth daily.     sertraline 100 MG tablet  Commonly known as:  ZOLOFT  Take 100 mg by mouth daily.     tamsulosin 0.4 MG Caps capsule  Commonly known as:  FLOMAX  Take 1 capsule (0.4 mg total) by mouth daily.     warfarin 5 MG tablet  Commonly known as:  COUMADIN  Take 1 tablet (5 mg total) by mouth daily at 6 PM. Take 5mg  daily        Disposition   The patient will be discharged in stable condition to home.     Discharge Instructions   ACE Inhibitor / ARB already ordered    Complete by:  As directed      Diet - low sodium heart healthy    Complete by:  As directed      Heart Failure patients record your daily weight using the same scale at the same time of day    Complete by:  As directed      Increase activity slowly    Complete by:  As directed           Follow-up Information   Follow up with Aundria Rudosgrove, Ali B, NP On 11/12/2013. (at 10:00 Heart Failure Clinic )    Specialty:  Nurse Practitioner   Contact information:   [address] [place] [address] [phone]         Duration of Discharge Encounter: Greater than 35 minutes   Signed, Sherald Hessmy D Clegg  NP-C  11/08/2013, 3:45 PM  Patient seen and examined with Tonye BecketAmy Clegg, NP. We discussed all aspects of the encounter. I agree with the assessment and plan as  stated above.  I have edited the note to reflect my changes.   Mallory Enriques R Niamh Rada,MD 1:24 PM

## 2013-11-08 NOTE — Progress Notes (Addendum)
Nursing note  Patient anxious to be discharged home, had removed own IVs stating could not wait, discharge instructions, AVS and medication list reviewed with patient and family, prescriptions had been sent to pt personal pharmacy. Will discharge home as ordered. Pt on Batteries with back up equipment. Randall An Kitiara Hintze RN

## 2013-11-08 NOTE — Progress Notes (Signed)
HeartMate 2 Rounding Note  Primary Physician: Dr. Oliver BarreJames [name]  Primary Cardiologist: Dr. Gala [name]   Subjective:    [name] is a 53 yo male with a history of severe CHF, NICM s/p LVAD and TVR (09/2011), VT, NSTEMI, LV thrombus, CKD, GERD, stroke and LVAD hemolysis with LVAD exchange (08/2013).   [facility] with NSTEMI and RV failure. He had a difficult course that was complicated by embolic right CVA. He was intubated and eventually had a tracheostomy placed 07/28/13. He was transferred to inpatient rehab for extensive rehab and was making progression, however despite aggressive therapy with heparin, coumadin, ASA and Plavix, his LDH remained persistently elevated in the 950 range (was about 1750 on admit). He did not experience any pump dysfunction, however the case was discussed with Dr Eric KatzPatel and [name] [facility] and it was felt to transfer him down there for possible pump exchange. He underwent a pump exchange 08/31/13. Post op was complicated by bleeding and he received multiple transfusions. He had AKI and had intermittent HD and underwent last HD on 09/24/13. Last Cr on chart 1.9 (10/02/13)  Patient presented to clinic on 5/14 without scheduled visit because he reported his weight was trending up and needed lasix which he did not have. Pertinent labs were Cr 1.08, K+ 3.0, INR 1.69, LDH 647 and pro-BNP 3033. Called EMS to transport patient to hospital.  IV amio started on 5/15 and converted to NSR. Yesterday received 500 cc NS bolus. Diuretics held. Denies SOB/dizziness.    LDH: 647>868>695>580> 572 > 530  Cr 1.03>1.07> 1.22>1.28>1.22 INR 1.84>2.01>2.21>2.3>2.4   LVAD INTERROGATION:  HeartMate II LVAD:  Flow 3.6 2 liters/min, speed 9400, power 4.8, PI 6.2 .  Multiple PI events this am  Objective:    Vital Signs:   Temp:  [97.8 F (36.6 C)-98.9 F (37.2 C)] 98.9 F (37.2 C) (05/18 0400) Pulse Rate:  [84-109] 95 (05/18 0400) SpO2:  [93 %-95 %] 93 % (05/18  0300) Weight:  [176 lb 9.6 oz (80.105 kg)] 176 lb 9.6 oz (80.105 kg) (05/18 0502) Last BM Date: 11/07/13 Mean arterial Pressure 80s  Intake/Output:   Intake/Output Summary (Last 24 hours) at 11/08/13 0828 Last data filed at 11/07/13 1300  Gross per 24 hour  Intake    480 ml  Output    300 ml  Net    180 ml     Physical Exam: General:  Weak appearing. No resp difficulty, lying in bed HEENT: normal Neck: supple. JVP 5 . Carotids 2+ bilat; no bruits. No lymphadenopathy or thryomegaly appreciated. Cor: Mechanical heart sounds with LVAD hum present. Lungs: clear Abdomen: soft, nontender, mildly distended. No hepatosplenomegaly. No bruits or masses. Good bowel sounds. Driveline: C/D/I; securement device intact and driveline incorporated Extremities: no cyanosis, clubbing, rash, tr edema,  Neuro: alert & orientedx3, cranial nerves grossly intact. moves all 4 extremities w/o difficulty. Affect pleasant  Telemetry: SR 80s with occasional PVCs.  Labs: Basic Metabolic Panel:  Recent Labs Lab 11/03/13 1915 11/04/13 0235 11/05/13 0319 11/06/13 0700 11/07/13 0723 11/08/13 0340  NA  --  140 138 142 142 140  K  --  3.8 3.0* 3.6* 3.5* 3.5*  CL  --  99 99 102 102 100  CO2  --  24 26 28 27 26   GLUCOSE  --  120* 90 99 99 87  BUN  --  6 6 7 9 9   CREATININE  --  1.03 1.611.07 1.22 1.28 1.22  CALCIUM  --  9.1 8.3* 8.6 8.1* 8.4  MG 1.6  --   --   --   --   --     Liver Function Tests: No results found for this basename: AST, ALT, ALKPHOS, BILITOT, PROT, ALBUMIN,  in the last 168 hours No results found for this basename: LIPASE, AMYLASE,  in the last 168 hours No results found for this basename: AMMONIA,  in the last 168 hours  CBC:  Recent Labs Lab 11/04/13 0236 11/05/13 0319 11/06/13 0700 11/07/13 0723 11/08/13 0340  WBC 6.2 5.5 4.8 4.7 4.3  HGB 11.7* 9.2* 9.9* 8.9* 9.4*  HCT 36.0* 29.0* 31.5* 28.3* 29.9*  MCV 79.5 80.8 80.2 80.9 79.5  PLT 293 250 305 269 310     INR:  Recent Labs Lab 11/04/13 0235 11/05/13 0319 11/06/13 0700 11/07/13 0723 11/08/13 0340  INR 1.84* 2.04* 2.21* 2.37* 2.43*    Imaging: No results found.   Medications:     Scheduled Medications: . amiodarone  400 mg Oral BID  . aspirin EC  325 mg Oral Daily  . carvedilol  3.125 mg Oral BID WC  . isosorbide mononitrate  60 mg Oral Daily  . losartan  25 mg Oral Daily  . mirtazapine  30 mg Oral QHS  . pantoprazole  40 mg Oral Daily  . potassium chloride  20 mEq Oral Daily  . sertraline  100 mg Oral Daily  . tamsulosin  0.4 mg Oral Daily  . Warfarin - Pharmacist Dosing Inpatient   Does not apply q1800    Infusions:    PRN Medications: acetaminophen, acetaminophen, hydrALAZINE   Assessment:   1) Hemolysis  2) A/C systolic HF  3) NICM  - s/p LVAD (08/2011 and 08/2013)  4) CVA  5) NSVT  6) HTN  7) Hypokalemia 8) AFL   Plan/Discussion:    Stable. Ready for d/c with HH.    If LDH continues to come down and INR > 2.0.    Lasix 20 mg daily Losartan 50 mg daily Coumadin 4 mg daily Kdur 40 mg daily Carvedilol 3.125 mg twice a day  Amiodarone 200 mg twice a day    Eric D Clegg,NP-C  8:28 AM   Patient seen and examined with Eric Becket, NP. We discussed all aspects of the encounter. I agree with the assessment and plan as stated above.   Did well overnight after getting IVF. MAPs elevated. LDH still coming down. INR therapeutic x 3 days. Will send home on above regimen. F/u on Clinic on Friday. Ned for compliance stressed. Remains in NSR. Will send home on amio 200 bid.   VAD interrogated personally and back-up controller reprogrammed.  Eric Buckles Mohogany Toppins,MD 8:39 AM

## 2013-11-08 NOTE — Progress Notes (Signed)
Paged Dr Gala Romney for patient's map of 110. No new orders. Will continue to monitor.

## 2013-11-08 NOTE — Progress Notes (Signed)
ANTICOAGULATION CONSULT NOTE - Follow up Consult  Pharmacy Consult for warfarin Indication: LVAD   Allergies  Allergen Reactions  . Ace Inhibitors Cough  . Lexapro [Escitalopram Oxalate] Other (See Comments)    somnolence    Patient Measurements: Weight 86.2 kg on 11/03/13 Height: 5\' 4"  (162.6 cm) Weight: 176 lb 9.6 oz (80.105 kg) IBW/kg (Calculated) : 59.2 Heparin Dosing Weight: 76kg  Vital Signs: Temp: 98.9 F (37.2 C) (05/18 0400) Temp src: Oral (05/18 0400) Pulse Rate: 95 (05/18 0400)  Labs:  Recent Labs  11/06/13 0700 11/06/13 1819 11/07/13 0723 11/08/13 0340  HGB 9.9*  --  8.9* 9.4*  HCT 31.5*  --  28.3* 29.9*  PLT 305  --  269 310  LABPROT 23.8*  --  25.1* 25.6*  INR 2.21*  --  2.37* 2.43*  HEPARINUNFRC 0.96* 0.81* 0.45  --   CREATININE 1.22  --  1.28 1.22    Estimated Creatinine Clearance: 69.3 ml/min (by C-G formula based on Cr of 1.22).  Medications:  Heparin @ 1300 units/hr  Assessment: 52yom on chronic coumadin prior to admission for LVAD (initially implanted 09/2011 then s/p pump exchange 08/2013 at [facility]). He was admitted for increased weight, SOB, and LE edema.   Coag: LVAD INR subtherapeutic on admission and concern for hemolysis as LDH elevated and trending up. Heparin bridge stopped 5/17.   INR at goal, will transition back to home regimen.  (appears patient was not taking him medications PTA).   Goal of Therapy:  INR 2-3 Monitor platelets by anticoagulation protocol: Yes   Plan:  1.Warfarin 4 mg today 2. Plan to resume home regimen on dc  Thank you for allowing pharmacy to be a part of this patients care team.  Lovenia Kim Pharm.D., BCPS, AQ-Cardiology Clinical Pharmacist 11/08/2013 8:36 AM Pager: [phone] Phone: [phone]

## 2013-11-12 ENCOUNTER — Other Ambulatory Visit (HOSPITAL_COMMUNITY): Payer: Self-pay | Admitting: *Deleted

## 2013-11-12 ENCOUNTER — Ambulatory Visit (HOSPITAL_COMMUNITY)
Admission: RE | Admit: 2013-11-12 | Discharge: 2013-11-12 | Disposition: A | Discharge status: Home / Self Care | Payer: Medicare Other | Source: Ambulatory Visit | Attending: Internal Medicine | Admitting: Internal Medicine

## 2013-11-12 ENCOUNTER — Ambulatory Visit (HOSPITAL_COMMUNITY): Payer: Self-pay | Admitting: *Deleted

## 2013-11-12 VITALS — BP 132/0 | HR 90 | Ht 64.0 in | Wt 185.2 lb

## 2013-11-12 DIAGNOSIS — F319 Bipolar disorder, unspecified: Secondary | ICD-10-CM

## 2013-11-12 DIAGNOSIS — I6350 Cerebral infarction due to unspecified occlusion or stenosis of unspecified cerebral artery: Secondary | ICD-10-CM

## 2013-11-12 DIAGNOSIS — Z7901 Long term (current) use of anticoagulants: Secondary | ICD-10-CM

## 2013-11-12 DIAGNOSIS — R0602 Shortness of breath: Secondary | ICD-10-CM

## 2013-11-12 DIAGNOSIS — Z95811 Presence of heart assist device: Secondary | ICD-10-CM

## 2013-11-12 DIAGNOSIS — I4892 Unspecified atrial flutter: Secondary | ICD-10-CM

## 2013-11-12 DIAGNOSIS — R0989 Other specified symptoms and signs involving the circulatory and respiratory systems: Secondary | ICD-10-CM | POA: Insufficient documentation

## 2013-11-12 DIAGNOSIS — R740 Nonspecific elevation of levels of transaminase and lactic acid dehydrogenase [LDH]: Secondary | ICD-10-CM

## 2013-11-12 DIAGNOSIS — I5022 Chronic systolic (congestive) heart failure: Secondary | ICD-10-CM

## 2013-11-12 DIAGNOSIS — R7402 Elevation of levels of lactic acid dehydrogenase (LDH): Secondary | ICD-10-CM

## 2013-11-12 DIAGNOSIS — R0609 Other forms of dyspnea: Secondary | ICD-10-CM | POA: Insufficient documentation

## 2013-11-12 DIAGNOSIS — I639 Cerebral infarction, unspecified: Secondary | ICD-10-CM

## 2013-11-12 LAB — BASIC METABOLIC PANEL
BUN: 7 mg/dL (ref 6–23)
CALCIUM: 9 mg/dL (ref 8.4–10.5)
CO2: 26 meq/L (ref 19–32)
Chloride: 104 mEq/L (ref 96–112)
Creatinine, Ser: 1.23 mg/dL (ref 0.50–1.35)
GFR calc Af Amer: 78 mL/min — ABNORMAL LOW (ref >90)
GFR calc non Af Amer: 67 mL/min — ABNORMAL LOW (ref >90)
GLUCOSE: 102 mg/dL — AB (ref 70–99)
Potassium: 3.5 mEq/L — ABNORMAL LOW (ref 3.7–5.3)
SODIUM: 143 meq/L (ref 137–147)

## 2013-11-12 LAB — CBC
HCT: 32.8 % — ABNORMAL LOW (ref 39.0–52.0)
HEMOGLOBIN: 10.1 g/dL — AB (ref 13.0–17.0)
MCH: 24.8 pg — ABNORMAL LOW (ref 26.0–34.0)
MCHC: 30.8 g/dL (ref 30.0–36.0)
MCV: 80.6 fL (ref 78.0–100.0)
Platelets: 358 10*3/uL (ref 150–400)
RBC: 4.07 MIL/uL — ABNORMAL LOW (ref 4.22–5.81)
RDW: 16.2 % — ABNORMAL HIGH (ref 11.5–15.5)
WBC: 3.8 10*3/uL — AB (ref 4.0–10.5)

## 2013-11-12 LAB — PRO B NATRIURETIC PEPTIDE: PRO B NATRI PEPTIDE: 4135 pg/mL — AB (ref 0–125)

## 2013-11-12 LAB — LACTATE DEHYDROGENASE: LDH: 652 U/L — ABNORMAL HIGH (ref 94–250)

## 2013-11-12 LAB — PROTIME-INR
INR: 2.22 — AB (ref 0.00–1.49)
Prothrombin Time: 23.9 seconds — ABNORMAL HIGH (ref 11.6–15.2)

## 2013-11-12 MED ORDER — CLOPIDOGREL BISULFATE 75 MG PO TABS: 75.0000 mg | ORAL_TABLET | Freq: Every day | ORAL | Status: DC

## 2013-11-12 NOTE — Progress Notes (Addendum)
Patient ID: [name], male   DOB: 09-26-1960, 53 y.o.   MRN: [id number]  Primary Physician: Dr. Oliver Barre  Primary Cardiologist: Dr. Gala Romney  HPI: Gaurav is a 53 yo male with a history of severe CHF, NICM s/p LVAD and TVR (09/2011), VT, NSTEMI, LV thrombus, CKD, GERD, stroke and LVAD hemolysis with LVAD exchange (08/2013).   He was admitted to [facility] January 2015 with NSTEMI and RV failure. He had a difficult course that was complicated by embolic right CVA. He was intubated and eventually had a tracheostomy placed 07/28/13. He was transferred to inpatient rehab for extensive rehab and was making progression, however despite aggressive therapy with heparin, coumadin, ASA and Plavix, his LDH remained persistently elevated in the 950 range (was about 1750 on admit). He did not experience any pump dysfunction, however the case was discussed with Dr Allena Katz and [name] at [facility] and it was felt to transfer him down there for possible pump exchange. He underwent a pump exchange 08/31/13. Post op was complicated by bleeding and he received multiple transfusions. He had AKI and had intermittent HD and underwent last HD on 09/24/13. Last Cr on chart 1.9 (10/02/13)  Admitted 5/13-5/18/15 for volume overload and possible hemolysis. LDH 647 and INR 1.69 on admission. Treated with IV lasix and IV heparin. While in the hospital developed some PI events and speed was decreased to 9400. ICD interrogated and showed A flutter and he was placed on IV amiodarone and then transitioned to 200 mg PO BID. He cardioverted to NSR on 5/15. LDH trended down and on discharge was 530 and INR 2.4.  Post Hospital F/U: Doing ok. Reports constant SOB even with conversation. Has not been taking losartan, protonix, sertraline or flomax since Monday because he did not have refills and did not call. SOB with walking to his driveway. Denies CP or PND. + orthopnea. Denies palpitations. Very confused about medications and not taking them correctly. No  dressing on driveline exit site. Not weighing daily needs batteries in scale. Did not take medications this am.   Denies LVAD alarms.  Denies driveline trauma, erythema or drainage.  Denies ICD shocks.   Reports taking Coumadin as prescribed and adherence to anticoagulation based dietary restrictions.  Denies bright red blood per rectum or melena, no dark urine or hematuria.     Past Medical History  Diagnosis Date  . CHF (congestive heart failure)     EF- 10-15  . Medically noncompliant   . Mitral regurgitation   . Tobacco user   . HTN (hypertension)   . AICD (automatic cardioverter/defibrillator) present   . GERD (gastroesophageal reflux disease)   . Substance abuse   . Chronic renal insufficiency   . Syncope   . Thrombus 08/06/2010  . SYSTOLIC HEART FAILURE, CHRONIC 09/22/2008    Qualifier: Diagnosis of  By: Gala Romney, MD, Trixie Dredge   . LV (left ventricular) mural thrombus 01/28/2011  . ICD - IN SITU 09/16/2008    Qualifier: Diagnosis of  By: Wonda Amis    . MITRAL STENOSIS/ INSUFFICIENCY, NON-RHEUMATIC 09/22/2008    Qualifier: Diagnosis of  By: Gala Romney, MD, Trixie Dredge Hepatomegaly 09/16/2008    Qualifier: Diagnosis of  By: Wonda Amis    . High cholesterol 02/26/2012    "at one time"  . Sleep apnea   . Exertional dyspnea 02/26/2012  . History of blood transfusion 08/2011    "when I had heart pump"  . Migraines   .  COMMON MIGRAINE 06/14/2009    Qualifier: Diagnosis of  By: Jonny RuizJohn MD, Len BlalockJames W   . History of gout 02/26/2012  . Depression 08/11/2013    Pt denies  . Bipolar affective disorder 10/22/2011    pt denies this hx 02/26/2012    Current Outpatient Prescriptions  Medication Sig Dispense Refill  . amiodarone (PACERONE) 200 MG tablet Take 1 tablet (200 mg total) by mouth 2 (two) times daily.  60 tablet  6  . aspirin EC 325 MG EC tablet Take 1 tablet (325 mg total) by mouth daily.  30 tablet  6  . carvedilol (COREG) 3.125 MG tablet Take 1 tablet  (3.125 mg total) by mouth 2 (two) times daily with a meal.  60 tablet  6  . furosemide (LASIX) 20 MG tablet Take 1 tablet (20 mg total) by mouth daily.  30 tablet  3  . mirtazapine (REMERON) 30 MG tablet Take 30 mg by mouth at bedtime.      . potassium chloride SA (K-DUR,KLOR-CON) 20 MEQ tablet Take 1 tablet (20 mEq total) by mouth daily.  30 tablet  6  . warfarin (COUMADIN) 5 MG tablet Take 1 tablet (5 mg total) by mouth daily at 6 PM. Take 5mg  daily  45 tablet  6  . losartan (COZAAR) 50 MG tablet Take 1 tablet (50 mg total) by mouth daily.  30 tablet  6  . pantoprazole (PROTONIX) 40 MG tablet Take 40 mg by mouth daily.      . sertraline (ZOLOFT) 100 MG tablet Take 100 mg by mouth daily.      . tamsulosin (FLOMAX) 0.4 MG CAPS capsule Take 1 capsule (0.4 mg total) by mouth daily.  30 capsule    . [DISCONTINUED] isosorbide mononitrate (IMDUR) 30 MG 24 hr tablet Take 1 tablet (30 mg total) by mouth daily.  60 tablet  6   No current facility-administered medications for this encounter.   Facility-Administered Medications Ordered in Other Encounters  Medication Dose Route Frequency Provider Last Rate Last Dose  . sodium chloride 0.9 % injection 10-40 mL  10-40 mL Intracatheter PRN Ranelle OysterZachary T Swartz, MD        Ace inhibitors and Lexapro  REVIEW OF SYSTEMS: All systems negative except as listed in HPI, PMH and Problem list.   LVAD INTERROGATION:   HeartMate II LVAD: Flow 3.6 liters/min Speed 9400 Power 4.8 PI 6.2 Alarms: Low flowon 5/20 PI events: Multiple PI 5 a day   I reviewed the LVAD parameters from today, and compared the results to the patient's prior recorded data. Increased the LVAD speed to 9600. The LVAD is functioning within specified parameters.  The patient performs LVAD self-test daily.  LVAD interrogation was negative for any significant power changes, alarms or PI events/speed drops.  LVAD equipment check completed and is in good working order.  Back-up equipment present.    LVAD education done on emergency procedures and precautions and reviewed exit site care.    Filed Vitals:   11/12/13 1031  BP: 132/0  Pulse: 90  Height: 5\' 4"  (1.626 m)  Weight: 185 lb 3.2 oz (84.006 kg)  SpO2: 98%    Physical Exam: GENERAL: Weak appearing, male who presents to clinic today, SOB after walking into clinic, caregiver present HEENT: normal  NECK: Supple, JVP 8 .2+ bilaterally, no bruits.  No lymphadenopathy or thyromegaly appreciated.   CARDIAC:  Mechanical heart sounds with LVAD hum present.  LUNGS:  Clear to auscultation bilaterally.  ABDOMEN:  Soft, round,  nontender, positive bowel sounds x4.     LVAD exit site: well-healed and incorporated.  No dressing.  No erythema or drainage.  Stabilization device present and accurately applied. Changed dressing in clinic using weekly dressing EXTREMITIES:  Warm and dry, no cyanosis, clubbing, rash, 1+ bilateral edema  NEUROLOGIC:  Alert and oriented x 4.  Gait steady.  No aphasia.  No dysarthria.  Affect pleasant.     EKG: NSR 81 BPM, RBBB Reviewed with Dr. Gala Romney  ASSESSMENT AND PLAN:   1) Chronic systolic HF: NICM, s/p LVAD implant (08/2011) and subsequent (08/2013) - Reviewed recent discharge summary. Patient was admitted for hemolysis and volume overload. - NYHA IIIb symptoms and volume status mildly elevated. Have asked the patient to please make sure he is taking lasix 20 mg daily at home and to take an extra 20 mg today. - Will not adjust any medications because not sure what meds the patient is taking. Have told him he needs to come back Tuesday with all his bottles and needs to allow caregiver to help give him his meds. Provided him with medication bag from clinic. - K+ 3.5 take extra 20 meq of potassium today - Would like him to be on losartan 50 mg daily and coreg 3.125 mg BID. Need to try and make medications as simple as possible. May need norvasc. - Reinforced the need and importance of daily weights, a low  sodium diet, and fluid restriction (less than 2 L a day). Instructed to call the HF clinic if weight increases more than 3 lbs overnight or 5 lbs in a week.  2) LVAD, s/p (08/2011) and then (08/2013) d/t pump thrombosis - As above MAP elevated 132. Told to go home and take medications and follow up next week.  - With increased pro-BNP and SOB as above will have him take extra lasix. Increased LVAD speed back to 9600 and changed back up speed to 9000. - Lengthy stern discussion with patient about my concerns. We discussed that currently he would not be tx candidate since he can't seem to get his medications right and that the risks of another stroke or pump failure with not taking meds correctly.  - LDH trending up, start Plavix 75 mg daily. 3) HTN - MAP elevated. As above will not change any medications but have asked him to go home and take all meds and to bring them all back to clinic on Tuesday to help him sort them out.  4) Hemolysis - LDH trending back up and is 652. Will start plavix 75 mg daily along with coumadin and ASA. Discussed with Dr. Gala Romney. 5) CVA - Patient clearly has deficits since stroke. He is not able to use L arm fully and cognitively seems to have declined. He can't remember anything and I dont think can comprehend simple tasks. Discussed with patient that he has to allow caregiver to help him and give him his medications. May need to consider SNF if he continues to struggle.  6) Atrial Flutter: - EKG today NSR. Will continue amiodarone 200 mg BID. 7) Anticoagulation management - INR therapeutic today. Will continue 5 mg daily and have discussed with [facility] about taking over anticaogulation. Do not think INR machine is accurate and will have patient get weekly blood draws.  F/U Tuesday.  [name] 1:52 PM

## 2013-11-12 NOTE — Patient Instructions (Addendum)
1. Take extra 20 meq K-Dur (potassium pill) today (2 pills today then back to 1 pill daily) 2. Take coumadin 5 mg daily 3. Take extra 20 mg Lasix today (2 pills today  4.  Start Plavix 75 mg daily 5.  Weekly dressing changes 6.  Return to VAD clinic 11/16/13  BRING ALL MEDICATIONS TO NEXT CLINIC VISIT  **Pick up Plavix, Protonic, Zoloft, Flomax, and Remeron at drug store. Call if any questions**

## 2013-11-12 NOTE — Progress Notes (Signed)
EKG CRITICAL VALUE     12 lead EKG performed.  Critical value noted.  Hessie Diener, RN notified.   Simon Rhein, CCT 11/12/2013 11:16 AM

## 2013-11-12 NOTE — Progress Notes (Signed)
Symptom  Yes  No  Details   Angina         x Activity:   Claudication         x How far:   Syncope         x When: occasional dizziness with orthostatic changes  Stroke        x    Orthopnea         x How many pillows: one  PND         x How often:  CPAP      N/A How many hrs:   Pedal edema         x   Abd fullness         x   N&V         x  good appetite; eats full meals  Diaphoresis         x When:  Bleeding        x   Urine color     yellow  SOB         x  Activity:  Room to room; conversational   Palpitations         x When:  ICD shock         x   Hospitlizaitons         x  When/where/why:  5/13 - 11/08/13 hemolysis  ED visit         x When/where/why:  Other MD         x When/who/why:  Activity     ? Starts PT today or next week per pt  Fluid           No limitations  Diet     No limitations   Vital signs: HR:  90 MAP BP:  132/0 O2 Sat: 98 Wt: 185.2 lbs   Last wt: discharge wt 176.9 lbs Ht: 5'4"  LVAD interrogation reveals:  Speed:  9400 Flow:  3.8 Power:  4.9 PI:  6.5 Alarms: low flow x 2 on 11/10/13 Events:  0 - 5 PI events Fixed speed:  9400 Low speed limit: 8800  LVAD exit site:  Well healed and incorporated. The velour is fully implanted at exit site. Dressing absent; pt states "didn't have time this morning".  Exit site cleaned using sterile technique, no erythema, tenderness, drainage, or foul odor noted. Stabilization device present and accurately applied.  Demonstrated sterile dressing change using weekly dressing kit to pt and friend [place]) using [employer] dressing with biopatch on exit site. Informed them dressing change needs to be done weekly or as needed to keep site dry. Dressing supplies provided. Will plan on return clinic visit next Tuesday and ask Tenisha to perform dressing change under VAD coordinator supervision.  Drive line twisted and kinked. Explained importance of keeping drive line free of bending, twisting, or kinks. Demonstrated proper  technique of wearing and caring for controller and drive line with verbalized understanding from pt and friend.   Primary and back up controllers re-programmed to fixed speed 9600 RPM and low speed limit 9000 RPM.   I reviewed the LVAD parameters from today, and compared the results to the patient's prior recorded data. The LVAD is functioning within specified parameters.  LVAD interrogation was negative for any significant power changes; two brief low flow alarms noted 11/10/13; pt and caregiver deny hearing any alarms. A few PI events/speed drops present which is consistent with the patient's history.    Reviewed  daily, weekly, and monthly maintenance of VAD equipment. Provided Daily log sheets and asked pt to complete each day and bring to clinic visits. Provided patient with thermometer for daily temps.   Stressed importance of bringing meds to each clinic visit; provided medication bag and medication list/notebook. Caregiver is supervising pt meds.   LVAD equipment check completed and is in good working order. Back-up equipment present. LVAD education done on emergency procedures and precautions and reviewed exit site care.

## 2013-11-15 NOTE — Addendum Note (Signed)
Encounter addended by: Simon Rhein, CCT on: 11/15/2013 12:43 PM<BR>     Documentation filed: Charges VN

## 2013-11-16 ENCOUNTER — Ambulatory Visit (HOSPITAL_COMMUNITY)
Admission: RE | Admit: 2013-11-16 | Discharge: 2013-11-16 | Disposition: A | Discharge status: Home / Self Care | Payer: Medicare Other | Source: Ambulatory Visit | Attending: Internal Medicine | Admitting: Internal Medicine

## 2013-11-16 ENCOUNTER — Other Ambulatory Visit (HOSPITAL_COMMUNITY): Payer: Self-pay | Admitting: *Deleted

## 2013-11-16 ENCOUNTER — Ambulatory Visit (HOSPITAL_COMMUNITY): Payer: Self-pay | Admitting: *Deleted

## 2013-11-16 VITALS — BP 124/0 | HR 84 | Ht 64.0 in | Wt 184.8 lb

## 2013-11-16 DIAGNOSIS — I639 Cerebral infarction, unspecified: Secondary | ICD-10-CM

## 2013-11-16 DIAGNOSIS — I4892 Unspecified atrial flutter: Secondary | ICD-10-CM | POA: Insufficient documentation

## 2013-11-16 DIAGNOSIS — I513 Intracardiac thrombosis, not elsewhere classified: Secondary | ICD-10-CM

## 2013-11-16 DIAGNOSIS — I10 Essential (primary) hypertension: Secondary | ICD-10-CM

## 2013-11-16 DIAGNOSIS — Z7901 Long term (current) use of anticoagulants: Secondary | ICD-10-CM

## 2013-11-16 DIAGNOSIS — I509 Heart failure, unspecified: Secondary | ICD-10-CM | POA: Insufficient documentation

## 2013-11-16 DIAGNOSIS — D7589 Other specified diseases of blood and blood-forming organs: Secondary | ICD-10-CM

## 2013-11-16 DIAGNOSIS — I219 Acute myocardial infarction, unspecified: Secondary | ICD-10-CM

## 2013-11-16 DIAGNOSIS — Z95811 Presence of heart assist device: Secondary | ICD-10-CM

## 2013-11-16 DIAGNOSIS — I6350 Cerebral infarction due to unspecified occlusion or stenosis of unspecified cerebral artery: Secondary | ICD-10-CM

## 2013-11-16 DIAGNOSIS — IMO0002 Reserved for concepts with insufficient information to code with codable children: Secondary | ICD-10-CM

## 2013-11-16 LAB — CBC WITH DIFFERENTIAL/PLATELET
BASOS PCT: 1 % (ref 0–1)
Basophils Absolute: 0 10*3/uL (ref 0.0–0.1)
Eosinophils Absolute: 0.4 10*3/uL (ref 0.0–0.7)
Eosinophils Relative: 9 % — ABNORMAL HIGH (ref 0–5)
HEMATOCRIT: 30.7 % — AB (ref 39.0–52.0)
HEMOGLOBIN: 9.6 g/dL — AB (ref 13.0–17.0)
LYMPHS ABS: 1 10*3/uL (ref 0.7–4.0)
LYMPHS PCT: 22 % (ref 12–46)
MCH: 24.4 pg — ABNORMAL LOW (ref 26.0–34.0)
MCHC: 31.3 g/dL (ref 30.0–36.0)
MCV: 78.1 fL (ref 78.0–100.0)
MONO ABS: 0.5 10*3/uL (ref 0.1–1.0)
MONOS PCT: 12 % (ref 3–12)
NEUTROS ABS: 2.5 10*3/uL (ref 1.7–7.7)
NEUTROS PCT: 56 % (ref 43–77)
Platelets: 391 10*3/uL (ref 150–400)
RBC: 3.93 MIL/uL — ABNORMAL LOW (ref 4.22–5.81)
RDW: 16 % — ABNORMAL HIGH (ref 11.5–15.5)
WBC: 4.4 10*3/uL (ref 4.0–10.5)

## 2013-11-16 LAB — BASIC METABOLIC PANEL
BUN: 11 mg/dL (ref 6–23)
CO2: 23 mEq/L (ref 19–32)
CREATININE: 1.22 mg/dL (ref 0.50–1.35)
Calcium: 9.1 mg/dL (ref 8.4–10.5)
Chloride: 102 mEq/L (ref 96–112)
GFR calc non Af Amer: 68 mL/min — ABNORMAL LOW (ref >90)
GFR, EST AFRICAN AMERICAN: 78 mL/min — AB (ref >90)
Glucose, Bld: 81 mg/dL (ref 70–99)
POTASSIUM: 4 meq/L (ref 3.7–5.3)
Sodium: 140 mEq/L (ref 137–147)

## 2013-11-16 LAB — PRO B NATRIURETIC PEPTIDE: Pro B Natriuretic peptide (BNP): 2378 pg/mL — ABNORMAL HIGH (ref 0–125)

## 2013-11-16 LAB — PROTIME-INR
INR: 4.3 — AB (ref 0.00–1.49)
Prothrombin Time: 39.6 seconds — ABNORMAL HIGH (ref 11.6–15.2)

## 2013-11-16 LAB — LACTATE DEHYDROGENASE: LDH: 652 U/L — AB (ref 94–250)

## 2013-11-16 MED ORDER — ISOSORBIDE MONONITRATE ER 60 MG PO TB24: 60.0000 mg | ORAL_TABLET | Freq: Every day | ORAL | Status: DC

## 2013-11-16 MED ORDER — HYDRALAZINE HCL 50 MG PO TABS: 75.0000 mg | ORAL_TABLET | Freq: Three times a day (TID) | ORAL | Status: DC

## 2013-11-16 NOTE — Progress Notes (Signed)
Symptom  Yes  No  Details   Angina         x Activity:   Claudication         x How far:   Syncope         x When: occasional dizziness with orthostatic changes  Stroke        x    Orthopnea         x How many pillows: one  PND         x How often:  CPAP      N/A How many hrs:   Pedal edema         x   Abd fullness         x   N&V         x  good appetite; eats full meals  Diaphoresis         x When:  Bleeding        x   Urine color     yellow  SOB         x  Activity:  Walking level ground 1/4 block  Palpitations         x When:  ICD shock         x   Hospitlizaitons                x When/where/why:    ED visit         x When/where/why:  Other MD         x When/who/why:  Activity           Limited; playing video games  Fluid           No limitations "drinking 24 oz day"  Diet     No limitations   Vital signs: HR:  84 MAP BP:  124/0 O2 Sat: 100 Wt: 184.8 lbs   Last wt: discharge wt 185.2 lbs Ht: 5'4"  LVAD interrogation reveals:  Speed:  9600 Flow:  3.3 Power:  5.0 PI:  5.5 Alarms:  5/26 11 low flow  5/25 24 low flow  5/24 47 low flow  5/23 25 low flow Events:  0 - 5 PI events Fixed speed:  9600 Low speed limit: 9000   LVAD exit site:  Well healed and incorporated. The velour is fully implanted at exit site. Guaze dressing present; pt states SorbaView dressing caused "itching". Pt changing dressing himself twice weekly. Stabilization device present and accurately applied.    Drive line twisted and kinked. Explained importance of keeping drive line free of bending, twisting, or kinks. Demonstrated proper technique of wearing and caring for controller and drive line with verbalized understanding from pt and friend.   Primary and back up controllers re-programmed to fixed speed 9400 RPM and low speed limit 8800 RPM.  I reviewed the LVAD parameters from today, and compared the results to the patient's prior recorded data. The LVAD is functioning within specified  parameters.  LVAD interrogation was negative for any significant power changes; multiple low flow alarms noted with reduction in pump speed per Ulla PotashAli Cosgrove, NP.  Pt and caregiver heard brief alarms on 5/24 and 5/25. A few PI events/speed drops present which is consistent with the patient's history.    Reviewed daily, weekly, and monthly maintenance of VAD equipment. Caregiver completing Daily log sheets as instructed. Marland Kitchen.    Pt/caregiver brought meds to clinic visit today as instructed. Caregiver is supervising pt meds.  LVAD equipment check completed and is in good working order. Back-up equipment present. LVAD education done on emergency procedures and precautions and reviewed exit site care.

## 2013-11-16 NOTE — Patient Instructions (Addendum)
1.  Start Imdur 60 mg daily 2.  Start Hydralazine 75 mg three times daily 3.  Call if any low flow alarms. 4.  Call if any dizziness or lightheadedness. 5.  Hold Coumadin today and tomorrow, then take 4 mg daily. 6.  Return to VAD clinic with ramp echo next Monday 11/22/13. 7.  Decrease Lasix to 20 mg every other day   **Reminder - VAD celebration 11/17/13 6:00 - 8:00 pm in [employer] ground floor (around corner from [place])

## 2013-11-16 NOTE — Progress Notes (Signed)
Patient ID: [name], male   DOB: 06-Oct-1960, 53 y.o.   MRN: [id number]  Primary Physician: Dr. Oliver [name]  Primary Cardiologist: Dr. 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   He was admitted to [facility] January 2015 with NSTEMI and RV failure. He had a difficult course that was complicated by embolic right CVA. He was intubated and eventually had a tracheostomy placed 07/28/13. He was transferred to inpatient rehab for extensive rehab and was making progression, however despite aggressive therapy with heparin, coumadin, ASA and Plavix, his LDH remained persistently elevated in the 950 range (was about 1750 on admit). He did not experience any pump dysfunction, however the case was discussed with Dr [name] and [name] at [facility] and it was felt to transfer him down there for possible pump exchange. He underwent a pump exchange 08/31/13. Post op was complicated by bleeding and he received multiple transfusions. He had AKI and had intermittent HD and underwent last HD on 09/24/13. Last Cr on chart 1.9 (10/02/13)  Admitted 5/13-5/18/15 for volume overload and possible hemolysis. LDH 647 and INR 1.69 on admission. Treated with IV lasix and IV heparin. While in the hospital developed some PI events and speed was decreased to 9400. ICD interrogated and showed A flutter and he was placed on IV amiodarone and then transitioned to 200 mg PO BID. He cardioverted to NSR on 5/15. LDH trended down and on discharge was 530 and INR 2.4.  Follow up: Last visit MAP 135 but no changes d/t the fact not sure what medications the patient was taking and he had not taken any medications for the day. His speed was increased from 9400 to 9600. Still having SOB with minimal activity, he can walk to the mailbox and back before stopping. Denies CP, orthopnea or palpitations. Taking all medications  now except protonix and brought them all to visit. Denies any bleeding issues. Weight at home 185-187 lbs. Following a low salt diet and drinking only about 24 oz a day. Sleeping a lot and reports being fatigued.  SH: Disabled, lives at home with caregiver. No ETOH or smoking FH: Mother living; HTN        Father living; no health issues   Multiple LVAD low flow alarms.  Denies driveline trauma, erythema or drainage.  Denies ICD shocks.   Reports taking Coumadin as prescribed and adherence to anticoagulation based dietary restrictions.  Denies bright red blood per rectum or melena, no dark urine or hematuria.     Past Medical History  Diagnosis Date  . CHF (congestive heart failure)     EF- 10-15  . Medically noncompliant   . Mitral regurgitation   . Tobacco user   . HTN (hypertension)   . AICD (automatic cardioverter/defibrillator) present   . GERD (gastroesophageal reflux disease)   . Substance abuse   . Chronic renal insufficiency   . Syncope   . Thrombus 08/06/2010  . SYSTOLIC HEART FAILURE, CHRONIC 09/22/2008    Qualifier: Diagnosis of  By: Eric Romney, MD, Trixie Dredge   . LV (left ventricular) mural thrombus 01/28/2011  . ICD - IN SITU 09/16/2008    Qualifier: Diagnosis of  By: Wonda Amis    . MITRAL STENOSIS/ INSUFFICIENCY, NON-RHEUMATIC 09/22/2008    Qualifier: Diagnosis of  By: Eric Romney, MD, Trixie Dredge Hepatomegaly  09/16/2008    Qualifier: Diagnosis of  By: Wonda Amis    . High cholesterol 02/26/2012    "at one time"  . Sleep apnea   . Exertional dyspnea 02/26/2012  . History of blood transfusion 08/2011    "when I had heart pump"  . Migraines   . COMMON MIGRAINE 06/14/2009    Qualifier: Diagnosis of  By: Jonny Ruiz MD, Len Blalock   . History of gout 02/26/2012  . Depression 08/11/2013    Pt denies  . Bipolar affective disorder 10/22/2011    pt denies this hx 02/26/2012    Current Outpatient Prescriptions  Medication Sig Dispense Refill  . amiodarone  (PACERONE) 200 MG tablet Take 1 tablet (200 mg total) by mouth 2 (two) times daily.  60 tablet  6  . aspirin EC 325 MG EC tablet Take 1 tablet (325 mg total) by mouth daily.  30 tablet  6  . carvedilol (COREG) 3.125 MG tablet Take 1 tablet (3.125 mg total) by mouth 2 (two) times daily with a meal.  60 tablet  6  . clopidogrel (PLAVIX) 75 MG tablet Take 1 tablet (75 mg total) by mouth daily with breakfast.  30 tablet  6  . furosemide (LASIX) 20 MG tablet Take 1 tablet (20 mg total) by mouth daily.  30 tablet  3  . losartan (COZAAR) 50 MG tablet Take 1 tablet (50 mg total) by mouth daily.  30 tablet  6  . mirtazapine (REMERON) 30 MG tablet Take 30 mg by mouth at bedtime.      . potassium chloride SA (K-DUR,KLOR-CON) 20 MEQ tablet Take 1 tablet (20 mEq total) by mouth daily.  30 tablet  6  . sertraline (ZOLOFT) 100 MG tablet Take 100 mg by mouth daily.      . tamsulosin (FLOMAX) 0.4 MG CAPS capsule Take 1 capsule (0.4 mg total) by mouth daily.  30 capsule    . pantoprazole (PROTONIX) 40 MG tablet Take 40 mg by mouth daily.      Marland Kitchen warfarin (COUMADIN) 5 MG tablet Take 1 tablet (5 mg total) by mouth daily at 6 PM. Take 5mg  daily  45 tablet  6  . [DISCONTINUED] isosorbide mononitrate (IMDUR) 30 MG 24 hr tablet Take 1 tablet (30 mg total) by mouth daily.  60 tablet  6   No current facility-administered medications for this encounter.   Facility-Administered Medications Ordered in Other Encounters  Medication Dose Route Frequency Provider Last Rate Last Dose  . sodium chloride 0.9 % injection 10-40 mL  10-40 mL Intracatheter PRN [name], MD        Ace inhibitors and Lexapro  REVIEW OF SYSTEMS: All systems negative except as listed in HPI, PMH and Problem list.   LVAD INTERROGATION:   HeartMate II LVAD: Flow 3.3 liters/min Speed 9600 Power 5.0 PI 5.5 Alarms: Low flow 5/23 x 25, 5/24 x 47, 5/25 x24, 5/26 x 11 PI events: 0-5  I reviewed the LVAD parameters from today, and compared  the results to the patient's prior recorded data. Increased the LVAD speed to 9600. The LVAD is functioning within specified parameters.  The patient performs LVAD self-test daily.  LVAD interrogation was negative for any significant power changes, alarms or PI events/speed drops.  LVAD equipment check completed and is in good working order.  Back-up equipment present.   LVAD education done on emergency procedures and precautions and reviewed exit site care.    Filed Vitals:   11/16/13 1409  BP: 124/0  Pulse: 84  Height: 5\' 4"  (1.626 m)  Weight: 184 lb 12.8 oz (83.825 kg)  SpO2: 100%    Physical Exam: GENERAL: Well appearing, male who presents to clinic today, caregiver present HEENT: normal  NECK: Supple, JVP 8 .2+ bilaterally, no bruits.  No lymphadenopathy or thyromegaly appreciated.   CARDIAC:  Mechanical heart sounds with LVAD hum present.  LUNGS:  Clear to auscultation bilaterally.  ABDOMEN:  Soft, round, nontender, positive bowel sounds x4.     LVAD exit site: well-healed and incorporated.  No dressing.  No erythema or drainage.  Stabilization device present and accurately applied. Changed dressing in clinic using weekly dressing EXTREMITIES:  Warm and dry, no cyanosis, clubbing, rash, 1+ bilateral edema  NEUROLOGIC:  Alert and oriented x 4.  Gait steady.  No aphasia.  No dysarthria.  Affect pleasant.     ASSESSMENT AND PLAN:   1) Chronic systolic HF: NICM, s/p LVAD implant (08/2011) and subsequent (08/2013) - NYHA III symptoms and volume status stable. Will continue lasix 20 mg daily.  - MAP remains elevated. Will start hydralazine 75 mg TID and start Imdur 60 mg daily. - Patient brought all medications to his visit and praised him for doing this. Pro-BNP down, flows down and multiple low flow alarms will change lasix to 20 mg QOD.  - Reinforced the need and importance of daily weights, a low sodium diet, and fluid restriction (less than 2 L a day). Instructed to call the HF clinic  if weight increases more than 3 lbs overnight or 5 lbs in a week.  2) LVAD, s/p (08/2011) and then (08/2013) d/t pump thrombosis - As above MAP remains elevated and we will start hydralazine 75 mg TID and Imdur 60 mg daily. - Multiple low flow alarms and flows running in the 3.1 range. He maybe a little dry and turned speed back down to 9400 and change lasix to QOD. Hoping that with better control of MAPs that flows will increase since the pump is not able to pump against high afterload. - Check INR, LDH, CBC, BMET and pro-BNP today - RAMP ECHO next visit to assess LV unloading and RV 3) HTN - as above 4) Hemolysis - LDH remains elevated 652, will continue plavix, coumadin and ASA.   5) CVA - Patient clearly has deficits since stroke. He is not able to use L arm fully and cognitively seems to have declined. He can't remember anything and I dont think can comprehend simple tasks. Last visit lengthy discussion with patient about allowing caregiver to assist with care and he is now letting her prepare medications.  6) Atrial Flutter: - Will continue amiodarone 200 mg BID. 7) Anticoagulation management - INR 4.3 today.  Will hold for two days and decrease daily dose from 5 mg daily to 4 mg daily which appears to be what he was on in the hopsital. Told the patient to call if he notices any bleeding from urine or stool. Will manage INR now.   F/U next week Aundria RudAli B Jaselyn Nahm 2:26 PM

## 2013-11-17 ENCOUNTER — Telehealth (HOSPITAL_COMMUNITY): Payer: Self-pay

## 2013-11-17 ENCOUNTER — Encounter (HOSPITAL_COMMUNITY): Payer: Self-pay

## 2013-11-17 ENCOUNTER — Other Ambulatory Visit (HOSPITAL_COMMUNITY): Payer: Self-pay | Admitting: *Deleted

## 2013-11-17 DIAGNOSIS — Z95811 Presence of heart assist device: Secondary | ICD-10-CM

## 2013-11-19 DIAGNOSIS — N189 Chronic kidney disease, unspecified: Secondary | ICD-10-CM

## 2013-11-19 DIAGNOSIS — I129 Hypertensive chronic kidney disease with stage 1 through stage 4 chronic kidney disease, or unspecified chronic kidney disease: Secondary | ICD-10-CM

## 2013-11-19 DIAGNOSIS — I509 Heart failure, unspecified: Secondary | ICD-10-CM

## 2013-11-19 DIAGNOSIS — I5022 Chronic systolic (congestive) heart failure: Secondary | ICD-10-CM

## 2013-11-21 ENCOUNTER — Telehealth (HOSPITAL_COMMUNITY): Payer: Self-pay | Admitting: *Deleted

## 2013-11-22 ENCOUNTER — Ambulatory Visit: Payer: Self-pay | Admitting: *Deleted

## 2013-11-22 ENCOUNTER — Encounter (HOSPITAL_COMMUNITY): Payer: Medicare Other

## 2013-11-22 ENCOUNTER — Telehealth (HOSPITAL_COMMUNITY): Payer: Self-pay | Admitting: *Deleted

## 2013-11-22 LAB — PROTIME-INR: INR: 4.2 — AB (ref <=1.1)

## 2013-11-22 NOTE — Telephone Encounter (Signed)
Called [name], brother answered phone, she is asleep. Asked to speak with her, she will call me back.  Called pt and asked if he planned on clinic visit today for low flow alarms and swelling. Pt denies low flow alarms, states he is swollen on left side only. Pt states he did take extra Lasix yesterday and denies any heart failure symptoms. He refuses clinic visit today stating he will keep appt for Wednesday clinic appt and echo.  [name] called back, she is not staying with pt, he wants to live alone. She goes by and checks on him daily, and says she cannot force him to come to clinic. She agrees to planned clinic visit Wednesday with echo.  VAD coordinator will contact Home Health and ask for home visit to evaluate patient.

## 2013-11-22 NOTE — Telephone Encounter (Signed)
Pt's friend, Irish Lack, called VAD pager to report pt having intermittent low flow alarms and is "swollen all over". Pt has been taking Lasix 20 mg every other day and took his dose today. Pt not performing daily weights due to no funds to buy 9V battey for home scales. Denies any other heart failure symptoms. Advised friend pt needs to come to ED for evaluation. Pt refused ED visit.   Per [name], NP instructed friend to give extra 20 mg lasix today and have pt come to clinic tomorrow for labs, VAD parameter check, and evaluation; she agreed to same.

## 2013-11-23 ENCOUNTER — Encounter: Payer: Self-pay | Admitting: Cardiology

## 2013-11-24 ENCOUNTER — Ambulatory Visit (HOSPITAL_COMMUNITY)
Admission: RE | Admit: 2013-11-24 | Discharge: 2013-11-24 | Disposition: A | Discharge status: Home / Self Care | Payer: Medicare Other | Source: Ambulatory Visit | Attending: Internal Medicine | Admitting: Internal Medicine

## 2013-11-24 ENCOUNTER — Ambulatory Visit: Payer: Self-pay | Admitting: *Deleted

## 2013-11-24 DIAGNOSIS — Z95811 Presence of heart assist device: Secondary | ICD-10-CM

## 2013-11-24 DIAGNOSIS — Z79899 Other long term (current) drug therapy: Secondary | ICD-10-CM | POA: Insufficient documentation

## 2013-11-24 DIAGNOSIS — Z7901 Long term (current) use of anticoagulants: Secondary | ICD-10-CM

## 2013-11-24 DIAGNOSIS — I059 Rheumatic mitral valve disease, unspecified: Secondary | ICD-10-CM

## 2013-11-24 LAB — BASIC METABOLIC PANEL
BUN: 10 mg/dL (ref 6–23)
CO2: 25 mEq/L (ref 19–32)
Calcium: 8.8 mg/dL (ref 8.4–10.5)
Chloride: 105 mEq/L (ref 96–112)
Creatinine, Ser: 1.21 mg/dL (ref 0.50–1.35)
GFR calc Af Amer: 79 mL/min — ABNORMAL LOW (ref >90)
GFR calc non Af Amer: 68 mL/min — ABNORMAL LOW (ref >90)
GLUCOSE: 81 mg/dL (ref 70–99)
POTASSIUM: 3.8 meq/L (ref 3.7–5.3)
Sodium: 142 mEq/L (ref 137–147)

## 2013-11-24 LAB — PROTIME-INR
INR: 3.85 — ABNORMAL HIGH (ref 0.00–1.49)
PROTHROMBIN TIME: 36.4 s — AB (ref 11.6–15.2)

## 2013-11-24 LAB — CBC
HCT: 30.7 % — ABNORMAL LOW (ref 39.0–52.0)
HEMOGLOBIN: 9.8 g/dL — AB (ref 13.0–17.0)
MCH: 24.5 pg — ABNORMAL LOW (ref 26.0–34.0)
MCHC: 31.9 g/dL (ref 30.0–36.0)
MCV: 76.8 fL — ABNORMAL LOW (ref 78.0–100.0)
Platelets: 294 10*3/uL (ref 150–400)
RBC: 4 MIL/uL — AB (ref 4.22–5.81)
RDW: 16.4 % — ABNORMAL HIGH (ref 11.5–15.5)
WBC: 6.8 10*3/uL (ref 4.0–10.5)

## 2013-11-24 LAB — LACTATE DEHYDROGENASE: LDH: 567 U/L — ABNORMAL HIGH (ref 94–250)

## 2013-11-24 NOTE — Progress Notes (Signed)
Symptom  Yes  No  Details   Angina         x Activity:   Claudication         x How far:   Syncope         x When:   Stroke        x    Orthopnea         x How many pillows: one  PND         x How often:  CPAP      N/A How many hrs:   Pedal edema         x   Abd fullness         x   N&V         x  good appetite; eats full meals  Diaphoresis         x When:  Bleeding        x   Urine color     yellow  SOB         x  Activity:  Walking level ground 1/4 block  Palpitations         x When:  ICD shock         x   Hospitlizaitons                x When/where/why:    ED visit         x When/where/why:  Other MD         x When/who/why:  Activity           Limited; playing video games  Fluid           No limitations "drinking 24 oz day"  Diet     No limitations   Vital signs: HR:  93 MAP BP: 110 O2 Sat: 98 Wt: 189 lbs   Last wt: 184.8 lbs Ht: 5'4"  LVAD interrogation reveals:  Speed:  9400 Flow:  3.7 Power:  5.0 PI:  6.2 Alarms: 2 brief low flow alarms Events:  Rare PI Fixed speed:  9400 Low speed limit: 8800   LVAD exit site:  Well healed and incorporated. The velour is fully implanted at exit site. SorbaView dressing present, but not applied correctly. Pt doing his own dressing changes.Stabilization device present.     I reviewed the LVAD parameters from today, and compared the results to the patient's prior recorded data. The LVAD is functioning within specified parameters.  LVAD interrogation was negative for any significant power changes; or significant low flow alarms. A few PI events/speed drops present which is consistent with the patient's history.     Pt/caregiver brought meds to clinic visit today as instructed, but unsure if patient is taking correctly. Having some problems with opening bottles. Picked up Rx for Imdur and Hydralazine today. States he took one 50 mg hydralazine (instead of 75 mg) and did not take imdur. Hydralazine not scored, pt has pill cutter,  unsure he can use correctly. He is no longer allowing his caregiver Irish Lack(Tenisha) to assist with medication or dressing management.  [facility] to assist with medication management or to fill his pill box at her visit earlier this week. Advised on importance of accepting help for correct medication and proper care of his equipment and site care.   [facility] him and let Tenisha assist as well.  VAD Coordinator called [facility] nurse  and Irish Lack to update them on current meds and his acceptance to let them assist him.  LVAD equipment check completed and is in good working order. Back-up equipment present. LVAD education done on emergency procedures and precautions and reviewed exit site care.    Speed  Flow  PI  Power  LVIDD  AI  Aortic openings  MR  TR  Septum  RV   9400 3.7 6.2 5.0 5.56 mild 0/5 Mild- mod Mild-mod  midline mod HK   9600  4.1 5.9 5.4 5.11 Mod 0/5 Mod Mild- mod Bowing toward left Mod HK  9200  3.8 6.6 4.8 5.14 mild 0/5 Mod mild midline                                           Doppler MAP: 100   Ramp ECHO performed in Echo lab per Dr. Gala Romney. At completion of ramp study, patients primary and back up controller programmed:  Fixed speed:  9400 Low speed limit:  8800

## 2013-11-24 NOTE — Progress Notes (Signed)
*  PRELIMINARY RESULTS* Echocardiogram 2D Echocardiogram has been performed.  [name] 11/24/2013, 3:48 PM

## 2013-11-24 NOTE — Patient Instructions (Signed)
1.  Take Lasix 40 mg daily for three days then take 40 mg every other day. 2.  Hold coumadin today. Will call with your dosing tomorrow.  3.  Start wearing support stockings. 4.  Return to VAD clinic one week. 5.  Let HH nurse fill pill box.

## 2013-11-25 ENCOUNTER — Telehealth (HOSPITAL_COMMUNITY): Payer: Self-pay | Admitting: *Deleted

## 2013-11-26 NOTE — Telephone Encounter (Signed)
[name] from [facility] called to report she was unable to reach patient by phone so she went to his house.   Nurse states she knocked on door repeatedly with no response.  VAD coordinator called caregiver [name]) who reports [name] will not answer door unless she is there. She agreed to go and check on pt and attempt to assist him with his medications. Strongly encouraged her to talk with [name] about allowing Home Health nurse to help per Dr. Dr. Prescott Gum instructions.  She verbalized agreement to do the same.

## 2013-11-29 ENCOUNTER — Telehealth: Payer: Self-pay | Admitting: Licensed Clinical Social Worker

## 2013-11-29 NOTE — Telephone Encounter (Signed)
CSW contacted patient to make appointment for 12/01/13 at 1:30 to meet with CSW in the clinic prior to clinic appointment. Patient verbalized confirmation. Lasandra Beech, LCSW [phone]

## 2013-12-01 ENCOUNTER — Ambulatory Visit (HOSPITAL_COMMUNITY): Payer: Self-pay | Admitting: *Deleted

## 2013-12-01 ENCOUNTER — Ambulatory Visit (HOSPITAL_COMMUNITY)
Admission: RE | Admit: 2013-12-01 | Discharge: 2013-12-01 | Disposition: A | Discharge status: Home / Self Care | Payer: Medicare Other | Source: Ambulatory Visit | Attending: Internal Medicine | Admitting: Internal Medicine

## 2013-12-01 ENCOUNTER — Other Ambulatory Visit (HOSPITAL_COMMUNITY): Payer: Self-pay | Admitting: *Deleted

## 2013-12-01 VITALS — HR 84 | Wt 189.0 lb

## 2013-12-01 DIAGNOSIS — R0989 Other specified symptoms and signs involving the circulatory and respiratory systems: Secondary | ICD-10-CM | POA: Insufficient documentation

## 2013-12-01 DIAGNOSIS — N189 Chronic kidney disease, unspecified: Secondary | ICD-10-CM | POA: Insufficient documentation

## 2013-12-01 DIAGNOSIS — I509 Heart failure, unspecified: Secondary | ICD-10-CM | POA: Insufficient documentation

## 2013-12-01 DIAGNOSIS — Z8659 Personal history of other mental and behavioral disorders: Secondary | ICD-10-CM | POA: Diagnosis not present

## 2013-12-01 DIAGNOSIS — I69919 Unspecified symptoms and signs involving cognitive functions following unspecified cerebrovascular disease: Secondary | ICD-10-CM | POA: Insufficient documentation

## 2013-12-01 DIAGNOSIS — Z862 Personal history of diseases of the blood and blood-forming organs and certain disorders involving the immune mechanism: Secondary | ICD-10-CM | POA: Diagnosis not present

## 2013-12-01 DIAGNOSIS — G4730 Sleep apnea, unspecified: Secondary | ICD-10-CM | POA: Diagnosis not present

## 2013-12-01 DIAGNOSIS — R160 Hepatomegaly, not elsewhere classified: Secondary | ICD-10-CM | POA: Insufficient documentation

## 2013-12-01 DIAGNOSIS — K219 Gastro-esophageal reflux disease without esophagitis: Secondary | ICD-10-CM | POA: Insufficient documentation

## 2013-12-01 DIAGNOSIS — F17200 Nicotine dependence, unspecified, uncomplicated: Secondary | ICD-10-CM | POA: Diagnosis not present

## 2013-12-01 DIAGNOSIS — I5022 Chronic systolic (congestive) heart failure: Secondary | ICD-10-CM | POA: Diagnosis present

## 2013-12-01 DIAGNOSIS — R29898 Other symptoms and signs involving the musculoskeletal system: Secondary | ICD-10-CM | POA: Diagnosis not present

## 2013-12-01 DIAGNOSIS — R740 Nonspecific elevation of levels of transaminase and lactic acid dehydrogenase [LDH]: Secondary | ICD-10-CM

## 2013-12-01 DIAGNOSIS — I059 Rheumatic mitral valve disease, unspecified: Secondary | ICD-10-CM | POA: Insufficient documentation

## 2013-12-01 DIAGNOSIS — Z09 Encounter for follow-up examination after completed treatment for conditions other than malignant neoplasm: Secondary | ICD-10-CM | POA: Diagnosis not present

## 2013-12-01 DIAGNOSIS — R0609 Other forms of dyspnea: Secondary | ICD-10-CM | POA: Diagnosis not present

## 2013-12-01 DIAGNOSIS — I129 Hypertensive chronic kidney disease with stage 1 through stage 4 chronic kidney disease, or unspecified chronic kidney disease: Secondary | ICD-10-CM | POA: Diagnosis not present

## 2013-12-01 DIAGNOSIS — I6990 Unspecified sequelae of unspecified cerebrovascular disease: Secondary | ICD-10-CM | POA: Insufficient documentation

## 2013-12-01 DIAGNOSIS — I4892 Unspecified atrial flutter: Secondary | ICD-10-CM | POA: Diagnosis not present

## 2013-12-01 DIAGNOSIS — I69998 Other sequelae following unspecified cerebrovascular disease: Secondary | ICD-10-CM | POA: Diagnosis not present

## 2013-12-01 DIAGNOSIS — R7401 Elevation of levels of liver transaminase levels: Secondary | ICD-10-CM

## 2013-12-01 DIAGNOSIS — Z95811 Presence of heart assist device: Secondary | ICD-10-CM

## 2013-12-01 DIAGNOSIS — R7402 Elevation of levels of lactic acid dehydrogenase (LDH): Secondary | ICD-10-CM

## 2013-12-01 DIAGNOSIS — D65 Disseminated intravascular coagulation [defibrination syndrome]: Secondary | ICD-10-CM | POA: Insufficient documentation

## 2013-12-01 DIAGNOSIS — Z7901 Long term (current) use of anticoagulants: Secondary | ICD-10-CM

## 2013-12-01 DIAGNOSIS — Z95810 Presence of automatic (implantable) cardiac defibrillator: Secondary | ICD-10-CM | POA: Diagnosis not present

## 2013-12-01 DIAGNOSIS — Z8639 Personal history of other endocrine, nutritional and metabolic disease: Secondary | ICD-10-CM | POA: Insufficient documentation

## 2013-12-01 DIAGNOSIS — I428 Other cardiomyopathies: Secondary | ICD-10-CM | POA: Insufficient documentation

## 2013-12-01 DIAGNOSIS — I10 Essential (primary) hypertension: Secondary | ICD-10-CM

## 2013-12-01 DIAGNOSIS — I214 Non-ST elevation (NSTEMI) myocardial infarction: Secondary | ICD-10-CM

## 2013-12-01 LAB — PRO B NATRIURETIC PEPTIDE: Pro B Natriuretic peptide (BNP): 2172 pg/mL — ABNORMAL HIGH (ref 0–125)

## 2013-12-01 LAB — CBC
HCT: 30 % — ABNORMAL LOW (ref 39.0–52.0)
HEMOGLOBIN: 9.1 g/dL — AB (ref 13.0–17.0)
MCH: 22.9 pg — ABNORMAL LOW (ref 26.0–34.0)
MCHC: 30.3 g/dL (ref 30.0–36.0)
MCV: 75.4 fL — ABNORMAL LOW (ref 78.0–100.0)
Platelets: 305 10*3/uL (ref 150–400)
RBC: 3.98 MIL/uL — ABNORMAL LOW (ref 4.22–5.81)
RDW: 16.9 % — AB (ref 11.5–15.5)
WBC: 4 10*3/uL (ref 4.0–10.5)

## 2013-12-01 LAB — BASIC METABOLIC PANEL
BUN: 9 mg/dL (ref 6–23)
CO2: 19 mEq/L (ref 19–32)
Calcium: 8.9 mg/dL (ref 8.4–10.5)
Chloride: 101 mEq/L (ref 96–112)
Creatinine, Ser: 1.21 mg/dL (ref 0.50–1.35)
GFR, EST AFRICAN AMERICAN: 79 mL/min — AB (ref >90)
GFR, EST NON AFRICAN AMERICAN: 68 mL/min — AB (ref >90)
GLUCOSE: 102 mg/dL — AB (ref 70–99)
POTASSIUM: 4 meq/L (ref 3.7–5.3)
Sodium: 138 mEq/L (ref 137–147)

## 2013-12-01 LAB — PROTIME-INR
INR: 2.5 — AB (ref 0.00–1.49)
Prothrombin Time: 26.2 seconds — ABNORMAL HIGH (ref 11.6–15.2)

## 2013-12-01 LAB — LACTATE DEHYDROGENASE: LDH: 534 U/L — AB (ref 94–250)

## 2013-12-01 MED ORDER — POTASSIUM CHLORIDE CRYS ER 20 MEQ PO TBCR: 20.0000 meq | EXTENDED_RELEASE_TABLET | Freq: Every day | ORAL | Status: DC

## 2013-12-01 MED ORDER — FUROSEMIDE 40 MG PO TABS: 40.0000 mg | ORAL_TABLET | Freq: Every day | ORAL | Status: DC

## 2013-12-01 NOTE — Progress Notes (Signed)
Patient ID: [name], male   DOB: Apr 08, 1961, 53 y.o.   MRN: [id number]  Primary Physician: Dr. Oliver [name]  Primary Cardiologist: Dr. 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   He was admitted to [facility] January 2015 with NSTEMI and RV failure. He had a difficult course that was complicated by embolic right CVA. He was intubated and eventually had a tracheostomy placed 07/28/13. He was transferred to inpatient rehab for extensive rehab and was making progression, however despite aggressive therapy with heparin, coumadin, ASA and Plavix, his LDH remained persistently elevated in the 950 range (was about 1750 on admit). He did not experience any pump dysfunction, however the case was discussed with Dr [name] and [name] at [facility] and it was felt to transfer him down there for possible pump exchange. He underwent a pump exchange 08/31/13. Post op was complicated by bleeding and he received multiple transfusions. He had AKI and had intermittent HD and underwent last HD on 09/24/13. Last Cr on chart 1.9 (10/02/13)  Admitted 5/13-5/18/15 for volume overload and possible hemolysis. LDH 647 and INR 1.69 on admission. Treated with IV lasix and IV heparin. While in the hospital developed some PI events and speed was decreased to 9400. ICD interrogated and showed A flutter and he was placed on IV amiodarone and then transitioned to 200 mg PO BID. He cardioverted to NSR on 5/15. LDH trended down and on discharge was 530 and INR 2.4.  Follow up for Heart Failure and LVAD: Last visit he was started on hydralazine 75 mg tid and imdur 60 mg daily. He was also volume overloaded and lasix was increased to 40 mg x 3 days and then go to 40 mg QOD.  Able to walk about 50 ft before getting SOB. Denies orthopnea, CP or palpitations. Weight at home 181 lbs. [name]  with his meds now. [name] and following low salt diet and drinking less than 2L a day.     SH: Disabled, lives at home with caregiver. No ETOH or smoking FH: Mother living; HTN        Father living; no health issues   No alarms since 6/2 last 2 low flow alarms.  Denies driveline trauma, erythema or drainage.  Denies ICD shocks.   Reports taking Coumadin as prescribed and adherence to anticoagulation based dietary restrictions.  Denies bright red blood per rectum or melena, no dark urine or hematuria.     Past Medical History  Diagnosis Date  . CHF (congestive heart failure)     EF- 10-15  . Medically noncompliant   . Mitral regurgitation   . Tobacco user   . HTN (hypertension)   . AICD (automatic cardioverter/defibrillator) present   . GERD (gastroesophageal reflux disease)   . Substance abuse   . Chronic renal insufficiency   . Syncope   . Thrombus 08/06/2010  . SYSTOLIC HEART FAILURE, CHRONIC 09/22/2008    Qualifier: Diagnosis of  By: Eric Romney, MD, Trixie Dredge   . LV (left ventricular) mural thrombus 01/28/2011  . ICD - IN SITU 09/16/2008    Qualifier: Diagnosis of  By: Wonda Amis    . MITRAL STENOSIS/ INSUFFICIENCY, NON-RHEUMATIC 09/22/2008    Qualifier: Diagnosis of  By: Eric Romney, MD, Trixie Dredge Hepatomegaly 09/16/2008  Qualifier: Diagnosis of  By: Wonda Amis    . High cholesterol 02/26/2012    "at one time"  . Sleep apnea   . Exertional dyspnea 02/26/2012  . History of blood transfusion 08/2011    "when I had heart pump"  . Migraines   . COMMON MIGRAINE 06/14/2009    Qualifier: Diagnosis of  By: Jonny Ruiz MD, Len Blalock   . History of gout 02/26/2012  . Depression 08/11/2013    Pt denies  . Bipolar affective disorder 10/22/2011    pt denies this hx 02/26/2012    Current Outpatient Prescriptions  Medication Sig Dispense Refill  . amiodarone (PACERONE) 200 MG tablet Take 1 tablet (200 mg total) by mouth 2 (two) times  daily.  60 tablet  6  . aspirin EC 325 MG EC tablet Take 1 tablet (325 mg total) by mouth daily.  30 tablet  6  . carvedilol (COREG) 3.125 MG tablet Take 1 tablet (3.125 mg total) by mouth 2 (two) times daily with a meal.  60 tablet  6  . clopidogrel (PLAVIX) 75 MG tablet Take 1 tablet (75 mg total) by mouth daily with breakfast.  30 tablet  6  . furosemide (LASIX) 40 MG tablet Take 1 tablet (40 mg total) by mouth daily.  30 tablet  3  . hydrALAZINE (APRESOLINE) 50 MG tablet Take 1.5 tablets (75 mg total) by mouth 3 (three) times daily.  135 tablet  6  . isosorbide mononitrate (IMDUR) 60 MG 24 hr tablet Take 1 tablet (60 mg total) by mouth daily.  60 tablet  6  . losartan (COZAAR) 50 MG tablet Take 1 tablet (50 mg total) by mouth daily.  30 tablet  6  . mirtazapine (REMERON) 30 MG tablet Take 30 mg by mouth at bedtime.      . potassium chloride SA (K-DUR,KLOR-CON) 20 MEQ tablet Take 1 tablet (20 mEq total) by mouth daily.  30 tablet  3  . sertraline (ZOLOFT) 100 MG tablet Take 100 mg by mouth daily.      . tamsulosin (FLOMAX) 0.4 MG CAPS capsule Take 1 capsule (0.4 mg total) by mouth daily.  30 capsule    . warfarin (COUMADIN) 3 MG tablet Take 3 mg by mouth daily.      . pantoprazole (PROTONIX) 40 MG tablet Take 40 mg by mouth daily.       No current facility-administered medications for this encounter.   Facility-Administered Medications Ordered in Other Encounters  Medication Dose Route Frequency Provider Last Rate Last Dose  . sodium chloride 0.9 % injection 10-40 mL  10-40 mL Intracatheter PRN [name], MD        Ace inhibitors and Lexapro  REVIEW OF SYSTEMS: All systems negative except as listed in HPI, PMH and Problem list.   LVAD INTERROGATION:   HeartMate II LVAD: Flow 3.7 liters/min Speed 9400 Power 4.8 PI 4.6 PI events: 0-2 a day  I reviewed the LVAD parameters from today, and compared the results to the patient's prior recorded data. No changes to LVAD speed.  The LVAD is functioning within specified parameters.  The patient performs LVAD self-test daily.  LVAD interrogation was negative for any significant power changes, alarms or PI events/speed drops.  LVAD equipment check completed and is in good working order.  Back-up equipment present.   LVAD education done on emergency procedures and precautions and reviewed exit site care.    Filed Vitals:   12/01/13 1357  Pulse: 84  Weight: 189 lb (85.73 kg)  SpO2: 94%  MAP: 92  Physical Exam: GENERAL: Fatigued appearing, male who presents to clinic today HEENT: normal  NECK: Supple, JVP 8-9 .2+ bilaterally, no bruits.  No lymphadenopathy or thyromegaly appreciated.   CARDIAC:  Mechanical heart sounds with LVAD hum present.  LUNGS:  Clear to auscultation bilaterally.  ABDOMEN:  Soft, round, nontender, positive bowel sounds x4.     LVAD exit site: well-healed and incorporated.  No dressing.  No erythema or drainage.  Stabilization device present and accurately applied. Dressing being changed weekly. EXTREMITIES:  Warm and dry, no cyanosis, clubbing, rash, 2+ bilateral edema L>R NEUROLOGIC:  Alert and oriented x 4.  Gait steady.  No aphasia.  No dysarthria.  Affect pleasant.     ASSESSMENT AND PLAN:   1) Chronic systolic HF: NICM, s/p LVAD implant (08/2011) and subsequent (08/2013) - NYHA IIIb symptoms and volume status elevated. Will increase lasix to 40 mg daily along with potassium to 20 meq daily. May need RHC to assess hemodynamics  - MAP stable. Patient reports taking medications as prescribed now and [facility] and caregiver to help. -  Reinforced the need and importance of daily weights, a low sodium diet, and fluid restriction (less than 2 L a day). Instructed to call the HF clinic if weight increases more than 3 lbs overnight or 5 lbs in a week.  2) LVAD, s/p (08/2011) and then (08/2013) d/t pump thrombosis - MAP much improved, 92. Will continue current meds. With more controlled MAP patient  has not had any low flow alarms.  - Check CBC, BMET, LDH, INR and pro-BNP today. - RAMP ECHO last visit and RV moderately HK 3) HTN - as above 4) Hemolysis - LDH stable. Will continue plavix, coumadin and ASA. No s/s of bleeding 5) CVA - Patient clearly has deficits since stroke. He is not able to use L arm fully and cognitively seems to have declined. Doing better now that [facility] is [facility] and caregiver to help with meds. Patient had LVAD battery cable very tangled today. Patient talked about moving in with his mother and recommended that he proceed with this move.  6) Atrial Flutter: - NSR 80s 7) Anticoagulation management - INR 2.5. No s/s of bleeding. Patient reports he decreased his Coumadin dose on his own to 3 mg daily. Discussed again the importance about allowing us to dose his Coumadin and not him. Will continue 3 mg daily and repeat next week 8) RV failure - Concerned with continued NYHA IIIb symptoms and volume status elevated that his RV is not doing well. He may need RHC to assess RV pressures.   F/U next week Ulla Potashosgrove, Ramah Langhans B NP-C 3:24 PM

## 2013-12-01 NOTE — Patient Instructions (Signed)
Increase your lasix to 40 mg daily. You currently have 20 mg tablets so you will need to take 2 tablets a day until you run out. When you run out your new prescription will be 40 mg tablets and then you will take 1 tablet daily (40 mg).  Increase your potassium to 20 meq daily.  No changes in Coumadin continue 3 mg daily.  Follow up in 1 week

## 2013-12-02 ENCOUNTER — Encounter: Payer: Self-pay | Admitting: Licensed Clinical Social Worker

## 2013-12-02 NOTE — Progress Notes (Signed)
CSW met with patient to discuss resources. Patient at first asked for assistance with obtaining employment. 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 CSW explored reasons for seeking employment and patient revealed that he is struggling to pay his household expenses and needs more monthly income. CSW discussed monthly expenses and patient is paying $750.00 monthly rent and only receives $850.00 monthly income. CSW discussed moving to low income/subsidized housing and patient agreeable to any options to reduce his monthly expenses admitting that employment would be very difficult given his current health status. CSW provided patient with some resources for debt assistance as well as [employer] and [employer]. Patient verbalizes understanding of resources provided and potential long waiting lists but is adamant about follow through and seeking assistance for housing. CSW offered supportive intervention and will continue to be available as needed. Raquel Sarna, Roy

## 2013-12-02 NOTE — Addendum Note (Signed)
Encounter addended by: Hershey Knauer M Roberts, CCT on: 12/02/2013  9:43 AM<BR>     Documentation filed: Charges VN

## 2013-12-07 ENCOUNTER — Other Ambulatory Visit (HOSPITAL_COMMUNITY): Payer: Self-pay | Admitting: *Deleted

## 2013-12-07 DIAGNOSIS — Z7901 Long term (current) use of anticoagulants: Secondary | ICD-10-CM

## 2013-12-07 DIAGNOSIS — Z95811 Presence of heart assist device: Secondary | ICD-10-CM

## 2013-12-08 ENCOUNTER — Ambulatory Visit (HOSPITAL_COMMUNITY): Payer: Self-pay | Admitting: *Deleted

## 2013-12-08 ENCOUNTER — Encounter (HOSPITAL_COMMUNITY): Payer: Medicare Other

## 2013-12-08 ENCOUNTER — Ambulatory Visit (HOSPITAL_COMMUNITY)
Admission: RE | Admit: 2013-12-08 | Discharge: 2013-12-08 | Disposition: A | Discharge status: Home / Self Care | Payer: Medicare Other | Source: Ambulatory Visit | Attending: Internal Medicine | Admitting: Internal Medicine

## 2013-12-08 VITALS — BP 100/0 | HR 98 | Ht 64.0 in | Wt 180.2 lb

## 2013-12-08 DIAGNOSIS — Z95811 Presence of heart assist device: Secondary | ICD-10-CM

## 2013-12-08 DIAGNOSIS — I10 Essential (primary) hypertension: Secondary | ICD-10-CM

## 2013-12-08 DIAGNOSIS — Z79899 Other long term (current) drug therapy: Secondary | ICD-10-CM | POA: Diagnosis not present

## 2013-12-08 DIAGNOSIS — I059 Rheumatic mitral valve disease, unspecified: Secondary | ICD-10-CM | POA: Diagnosis not present

## 2013-12-08 DIAGNOSIS — I129 Hypertensive chronic kidney disease with stage 1 through stage 4 chronic kidney disease, or unspecified chronic kidney disease: Secondary | ICD-10-CM | POA: Insufficient documentation

## 2013-12-08 DIAGNOSIS — Z95810 Presence of automatic (implantable) cardiac defibrillator: Secondary | ICD-10-CM | POA: Diagnosis not present

## 2013-12-08 DIAGNOSIS — D7589 Other specified diseases of blood and blood-forming organs: Secondary | ICD-10-CM

## 2013-12-08 DIAGNOSIS — I513 Intracardiac thrombosis, not elsewhere classified: Secondary | ICD-10-CM

## 2013-12-08 DIAGNOSIS — N189 Chronic kidney disease, unspecified: Secondary | ICD-10-CM | POA: Insufficient documentation

## 2013-12-08 DIAGNOSIS — I6350 Cerebral infarction due to unspecified occlusion or stenosis of unspecified cerebral artery: Secondary | ICD-10-CM

## 2013-12-08 DIAGNOSIS — Z8673 Personal history of transient ischemic attack (TIA), and cerebral infarction without residual deficits: Secondary | ICD-10-CM | POA: Insufficient documentation

## 2013-12-08 DIAGNOSIS — I5022 Chronic systolic (congestive) heart failure: Secondary | ICD-10-CM | POA: Insufficient documentation

## 2013-12-08 DIAGNOSIS — I252 Old myocardial infarction: Secondary | ICD-10-CM | POA: Insufficient documentation

## 2013-12-08 DIAGNOSIS — I509 Heart failure, unspecified: Secondary | ICD-10-CM | POA: Insufficient documentation

## 2013-12-08 DIAGNOSIS — K219 Gastro-esophageal reflux disease without esophagitis: Secondary | ICD-10-CM | POA: Insufficient documentation

## 2013-12-08 DIAGNOSIS — Z7901 Long term (current) use of anticoagulants: Secondary | ICD-10-CM

## 2013-12-08 DIAGNOSIS — I4892 Unspecified atrial flutter: Secondary | ICD-10-CM | POA: Insufficient documentation

## 2013-12-08 DIAGNOSIS — IMO0002 Reserved for concepts with insufficient information to code with codable children: Secondary | ICD-10-CM

## 2013-12-08 DIAGNOSIS — I639 Cerebral infarction, unspecified: Secondary | ICD-10-CM

## 2013-12-08 LAB — BASIC METABOLIC PANEL
BUN: 8 mg/dL (ref 6–23)
CALCIUM: 9 mg/dL (ref 8.4–10.5)
CO2: 19 mEq/L (ref 19–32)
CREATININE: 1.26 mg/dL (ref 0.50–1.35)
Chloride: 103 mEq/L (ref 96–112)
GFR calc Af Amer: 75 mL/min — ABNORMAL LOW (ref >90)
GFR calc non Af Amer: 65 mL/min — ABNORMAL LOW (ref >90)
Glucose, Bld: 103 mg/dL — ABNORMAL HIGH (ref 70–99)
Potassium: 4.1 mEq/L (ref 3.7–5.3)
SODIUM: 141 meq/L (ref 137–147)

## 2013-12-08 LAB — CBC
HCT: 31.4 % — ABNORMAL LOW (ref 39.0–52.0)
Hemoglobin: 9.6 g/dL — ABNORMAL LOW (ref 13.0–17.0)
MCH: 22.8 pg — AB (ref 26.0–34.0)
MCHC: 30.6 g/dL (ref 30.0–36.0)
MCV: 74.6 fL — AB (ref 78.0–100.0)
PLATELETS: 328 10*3/uL (ref 150–400)
RBC: 4.21 MIL/uL — ABNORMAL LOW (ref 4.22–5.81)
RDW: 17.4 % — AB (ref 11.5–15.5)
WBC: 3.8 10*3/uL — ABNORMAL LOW (ref 4.0–10.5)

## 2013-12-08 LAB — PROTIME-INR
INR: 3.76 — ABNORMAL HIGH (ref 0.00–1.49)
Prothrombin Time: 35.7 seconds — ABNORMAL HIGH (ref 11.6–15.2)

## 2013-12-08 LAB — PRO B NATRIURETIC PEPTIDE: Pro B Natriuretic peptide (BNP): 2142 pg/mL — ABNORMAL HIGH (ref 0–125)

## 2013-12-08 LAB — LACTATE DEHYDROGENASE: LDH: 536 U/L — AB (ref 94–250)

## 2013-12-08 MED ORDER — WARFARIN SODIUM 3 MG PO TABS: 3.0000 mg | ORAL_TABLET | Freq: Every day | ORAL | Status: DC

## 2013-12-08 MED ORDER — HYDRALAZINE HCL 100 MG PO TABS: 100.0000 mg | ORAL_TABLET | Freq: Three times a day (TID) | ORAL | Status: DC

## 2013-12-08 NOTE — Patient Instructions (Signed)
1.  Increase Hydralazine to 100 mg three times daily (take two of the 50 mg tabs three times daily until gone). Will send new Rx for 100 mg tabs - take one tab three times daily when you run out of the old Rx. 2.  Return to VAD clinic 2 weeks.

## 2013-12-08 NOTE — Progress Notes (Signed)
Patient ID: [name], male   DOB: 1961/02/12, 53 y.o.   MRN: [id number]  Primary Physician: Dr. Oliver [name]  Primary Cardiologist: Dr. 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   He was admitted to [facility] January 2015 with NSTEMI and RV failure. He had a difficult course that was complicated by embolic right CVA. He was intubated and eventually had a tracheostomy placed 07/28/13. He was transferred to inpatient rehab for extensive rehab and was making progression, however despite aggressive therapy with heparin, coumadin, ASA and Plavix, his LDH remained persistently elevated in the 950 range (was about 1750 on admit). He did not experience any pump dysfunction, however the case was discussed with Dr [name] and [name] at [facility] and it was felt to transfer him down there for possible pump exchange. He underwent a pump exchange 08/31/13. Post op was complicated by bleeding and he received multiple transfusions. He had AKI and had intermittent HD and underwent last HD on 09/24/13. Last Cr on chart 1.9 (10/02/13)  Admitted 5/13-5/18/15 for volume overload and possible hemolysis. LDH 647 and INR 1.69 on admission. Treated with IV lasix and IV heparin. While in the hospital developed some PI events and speed was decreased to 9400. ICD interrogated and showed A flutter and he was placed on IV amiodarone and then transitioned to 200 mg PO BID. He cardioverted to NSR on 5/15. LDH trended down and on discharge was 530 and INR 2.4.  Follow up for Heart Failure and LVAD: Last visit increased lasix to 40 mg daily and potassium to 20 meq daily. Overall feeling good. Weight is down 8 lbs. Denies SOB, PND, orthopnea or CP. Letting [name] help him with his meds now. [name] and following low salt diet and drinking less than 2L a day. Still SOB with  walking aobut 1/4 mile.  SH: Disabled, lives at home with caregiver. No ETOH or smoking FH: Mother living; HTN        Father living; no health issues   No alarms.  Denies driveline trauma, erythema or drainage.  Denies ICD shocks.   Reports taking Coumadin as prescribed and adherence to anticoagulation based dietary restrictions.  Denies bright red blood per rectum or melena, no dark urine or hematuria.     Past Medical History  Diagnosis Date  . CHF (congestive heart failure)     EF- 10-15  . Medically noncompliant   . Mitral regurgitation   . Tobacco user   . HTN (hypertension)   . AICD (automatic cardioverter/defibrillator) present   . GERD (gastroesophageal reflux disease)   . Substance abuse   . Chronic renal insufficiency   . Syncope   . Thrombus 08/06/2010  . SYSTOLIC HEART FAILURE, CHRONIC 09/22/2008    Qualifier: Diagnosis of  By: Eric Romney, MD, [name]   . LV (left ventricular) mural thrombus 01/28/2011  . ICD - IN SITU 09/16/2008    Qualifier: Diagnosis of  By: [name]    . MITRAL STENOSIS/ INSUFFICIENCY, NON-RHEUMATIC 09/22/2008    Qualifier: Diagnosis of  By: Eric Romney, MD, [name] Hepatomegaly 09/16/2008    Qualifier: Diagnosis of  By: [name]    . High cholesterol 02/26/2012    "at one time"  . Sleep apnea   . Exertional dyspnea 02/26/2012  .  History of blood transfusion 08/2011    "when I had heart pump"  . Migraines   . COMMON MIGRAINE 06/14/2009    Qualifier: Diagnosis of  By: Eric Ruiz MD, [name]   . History of gout 02/26/2012  . Depression 08/11/2013    Pt denies  . Bipolar affective disorder 10/22/2011    pt denies this hx 02/26/2012    Current Outpatient Prescriptions  Medication Sig Dispense Refill  . amiodarone (PACERONE) 200 MG tablet Take 1 tablet (200 mg total) by mouth 2 (two) times daily.  60 tablet  6  . aspirin EC 325 MG EC tablet Take 1 tablet (325 mg total) by mouth daily.  30 tablet  6  . carvedilol (COREG)  3.125 MG tablet Take 1 tablet (3.125 mg total) by mouth 2 (two) times daily with a meal.  60 tablet  6  . clopidogrel (PLAVIX) 75 MG tablet Take 1 tablet (75 mg total) by mouth daily with breakfast.  30 tablet  6  . furosemide (LASIX) 40 MG tablet Take 1 tablet (40 mg total) by mouth daily.  30 tablet  3  . hydrALAZINE (APRESOLINE) 50 MG tablet Take 1.5 tablets (75 mg total) by mouth 3 (three) times daily.  135 tablet  6  . isosorbide mononitrate (IMDUR) 60 MG 24 hr tablet Take 1 tablet (60 mg total) by mouth daily.  60 tablet  6  . losartan (COZAAR) 50 MG tablet Take 1 tablet (50 mg total) by mouth daily.  30 tablet  6  . mirtazapine (REMERON) 30 MG tablet Take 30 mg by mouth at bedtime.      . potassium chloride SA (K-DUR,KLOR-CON) 20 MEQ tablet Take 1 tablet (20 mEq total) by mouth daily.  30 tablet  3  . sertraline (ZOLOFT) 100 MG tablet Take 100 mg by mouth daily.      . tamsulosin (FLOMAX) 0.4 MG CAPS capsule Take 1 capsule (0.4 mg total) by mouth daily.  30 capsule    . warfarin (COUMADIN) 3 MG tablet Take 3 mg by mouth daily.      . pantoprazole (PROTONIX) 40 MG tablet Take 40 mg by mouth daily.       No current facility-administered medications for this encounter.   Facility-Administered Medications Ordered in Other Encounters  Medication Dose Route Frequency [name] Last Rate Last Dose  . sodium chloride 0.9 % injection 10-40 mL  10-40 mL Intracatheter PRN Eric Oyster, MD        Ace inhibitors and Lexapro  REVIEW OF SYSTEMS: All systems negative except as listed in HPI, PMH and Problem list.   LVAD INTERROGATION:   HeartMate II LVAD: Flow 4.0 liters/min Speed 9400 Power 5.1 PI 6.5 PI events: 0-2 a day  I reviewed the LVAD parameters from today, and compared the results to the patient's prior recorded data. No changes to LVAD speed. The LVAD is functioning within specified parameters.  The patient performs LVAD self-test daily.  LVAD interrogation was negative for any  significant power changes, alarms or PI events/speed drops.  LVAD equipment check completed and is in good working order.  Back-up equipment present.   LVAD education done on emergency procedures and precautions and reviewed exit site care.    Filed Vitals:   12/08/13 1052  BP: 100/0  Pulse: 98  Height: 5\' 4"  (1.626 m)  Weight: 180 lb 3.2 oz (81.738 kg)  SpO2: 97%  MAP: 92  Physical Exam: GENERAL: Fatigued appearing, male who presents to clinic  today HEENT: normal  NECK: Supple, JVP 7-8.2+ bilaterally, no bruits.  No lymphadenopathy or thyromegaly appreciated.   CARDIAC:  Mechanical heart sounds with LVAD hum present.  LUNGS:  Clear to auscultation bilaterally.  ABDOMEN:  Soft, round, nontender, positive bowel sounds x4.     LVAD exit site: well-healed and incorporated.  No dressing.  No erythema or drainage.  Stabilization device present and accurately applied. Dressing being changed weekly. EXTREMITIES:  Warm and dry, no cyanosis, clubbing, rash, 1+ bilateral edema L>R NEUROLOGIC:  Alert and oriented x 4.  Gait steady.  No aphasia.  No dysarthria.  Affect pleasant.     ASSESSMENT AND PLAN:   1) Chronic systolic HF: NICM, s/p LVAD implant (08/2011) and subsequent (08/2013) - NYHA II-III symptoms and volume status improved. Will continue lasix 40 mg daily. - MAP continues to improve however still slightly elevated 98. Will increase hydralazine to 100 mg TID. - No BB with hx of RV failure -  Reinforced the need and importance of daily weights, a low sodium diet, and fluid restriction (less than 2 L a day). Instructed to call the HF clinic if weight increases more than 3 lbs overnight or 5 lbs in a week.  2) LVAD, s/p (08/2011) and then (08/2013) d/t pump thrombosis - Doing much better. As above MAP slightly elevated and will increase hydralazine to 100 mg TID. No longer having PI events or low flows with more controlled MAP.  - Check CBC, BMET, LDH, INR and pro-BNP today. - RAMP ECHO  (10/2013) and RV moderately HK 3) HTN - as above 4) Hemolysis - Check LDH. Will continue plavix, coumadin and ASA. No s/s of bleeding 5) CVA - Patient clearly has deficits since stroke. He is not able to use L arm fully and cognitively seems to have declined. Continues to do better and is allowing others to help with his meds which has helped with his management. Still encouraged patient may ben beneficial to move in with mother at least temporarily.  6) Atrial Flutter: - Last visit NSR. Will continue amio 200 mg BID and can consider decreasing next visit.  7) Anticoagulation management - INR 3.76 No s/s of bleeding. Hold coumadin today and tomorrow take 1.5 mg then go back to 3 mg daily. INR management has been difficult in the past with patient.  8) RV failure - Fluid status much improved. As above will not start BB.   F/U 2 weeks.  Ulla Potashosgrove, Ali B NP-C 11:23 AM

## 2013-12-08 NOTE — Progress Notes (Signed)
Symptom  Yes  No  Details   Angina         x Activity:   Claudication         x How far:   Syncope         x When:   Stroke        x    Orthopnea         x How many pillows: one  PND         x How often:  CPAP      N/A How many hrs:   Pedal edema         x   Abd fullness         x   N&V         x  good appetite; eats full meals  Diaphoresis         x When:  Bleeding        x   Urine color     yellow  SOB         x  Activity:  Walking level ground 1/4 block  Palpitations         x When:  ICD shock         x   Hospitlizaitons                x When/where/why:    ED visit         x When/where/why:  Other MD         x When/who/why:  Activity           Limited; playing video games  Fluid           No limitations "drinking 24 oz day"  Diet     No limitations   Vital signs: HR:  98 MAP BP: 100 O2 Sat: 97 Wt: 180.2 lbs   Last wt: 189 lbs Ht: 5'4"  LVAD interrogation reveals:  Speed:  9400 Flow:  4.1 Power:  5.2 PI:  6.7 Alarms: none Events:  0 - 5 PI events Fixed speed:  9400 Low speed limit: 8800   LVAD exit site:  Well healed and incorporated. The velour is fully implanted at exit site. SorbaView dressing present, and applied correctly. Pt doing his own dressing changes.Stabilization device present.    I reviewed the LVAD parameters from today, and compared the results to the patient's prior recorded data. The LVAD is functioning within specified parameters.  LVAD interrogation was negative for any significant power changes; or significant low flow alarms. A few PI events/speed drops present which is consistent with the patient's history.    Pt/caregiver brought meds to clinic visit today as instructed and is allowing [facility] to help fill pill box. Refills called for meds. Home Health has discharged patient; but patient states he is doing well with minimal assistance form [place].   LVAD equipment check completed and is in good working order. Back-up equipment present. LVAD  education done on emergency procedures and precautions and reviewed exit site care.

## 2013-12-15 ENCOUNTER — Emergency Department (HOSPITAL_COMMUNITY): Payer: Medicare Other

## 2013-12-15 ENCOUNTER — Encounter (HOSPITAL_COMMUNITY): Payer: Self-pay | Admitting: *Deleted

## 2013-12-15 ENCOUNTER — Emergency Department (HOSPITAL_COMMUNITY)
Admission: EM | Admit: 2013-12-15 | Discharge: 2013-12-15 | Disposition: A | Discharge status: Home / Self Care | Payer: Medicare Other | Attending: Emergency Medicine | Admitting: Emergency Medicine

## 2013-12-15 ENCOUNTER — Encounter (HOSPITAL_COMMUNITY): Payer: Self-pay | Admitting: Emergency Medicine

## 2013-12-15 DIAGNOSIS — R42 Dizziness and giddiness: Secondary | ICD-10-CM | POA: Diagnosis present

## 2013-12-15 DIAGNOSIS — Z79899 Other long term (current) drug therapy: Secondary | ICD-10-CM | POA: Diagnosis not present

## 2013-12-15 DIAGNOSIS — Z7982 Long term (current) use of aspirin: Secondary | ICD-10-CM | POA: Insufficient documentation

## 2013-12-15 DIAGNOSIS — Z7902 Long term (current) use of antithrombotics/antiplatelets: Secondary | ICD-10-CM | POA: Insufficient documentation

## 2013-12-15 DIAGNOSIS — Z95811 Presence of heart assist device: Secondary | ICD-10-CM

## 2013-12-15 DIAGNOSIS — Z87448 Personal history of other diseases of urinary system: Secondary | ICD-10-CM | POA: Insufficient documentation

## 2013-12-15 DIAGNOSIS — F319 Bipolar disorder, unspecified: Secondary | ICD-10-CM | POA: Diagnosis not present

## 2013-12-15 DIAGNOSIS — I129 Hypertensive chronic kidney disease with stage 1 through stage 4 chronic kidney disease, or unspecified chronic kidney disease: Secondary | ICD-10-CM | POA: Insufficient documentation

## 2013-12-15 DIAGNOSIS — Z7901 Long term (current) use of anticoagulants: Secondary | ICD-10-CM | POA: Insufficient documentation

## 2013-12-15 DIAGNOSIS — K219 Gastro-esophageal reflux disease without esophagitis: Secondary | ICD-10-CM | POA: Diagnosis not present

## 2013-12-15 DIAGNOSIS — R079 Chest pain, unspecified: Secondary | ICD-10-CM | POA: Insufficient documentation

## 2013-12-15 DIAGNOSIS — E7800 Pure hypercholesterolemia, unspecified: Secondary | ICD-10-CM | POA: Insufficient documentation

## 2013-12-15 DIAGNOSIS — I951 Orthostatic hypotension: Secondary | ICD-10-CM

## 2013-12-15 DIAGNOSIS — N189 Chronic kidney disease, unspecified: Secondary | ICD-10-CM | POA: Insufficient documentation

## 2013-12-15 DIAGNOSIS — I4729 Other ventricular tachycardia: Secondary | ICD-10-CM

## 2013-12-15 DIAGNOSIS — I472 Ventricular tachycardia: Secondary | ICD-10-CM

## 2013-12-15 DIAGNOSIS — I5022 Chronic systolic (congestive) heart failure: Secondary | ICD-10-CM | POA: Insufficient documentation

## 2013-12-15 DIAGNOSIS — Z95810 Presence of automatic (implantable) cardiac defibrillator: Secondary | ICD-10-CM | POA: Diagnosis not present

## 2013-12-15 DIAGNOSIS — I059 Rheumatic mitral valve disease, unspecified: Secondary | ICD-10-CM | POA: Insufficient documentation

## 2013-12-15 DIAGNOSIS — Z87891 Personal history of nicotine dependence: Secondary | ICD-10-CM | POA: Insufficient documentation

## 2013-12-15 LAB — CBC WITH DIFFERENTIAL/PLATELET
BASOS ABS: 0 10*3/uL (ref 0.0–0.1)
Basophils Relative: 1 % (ref 0–1)
EOS PCT: 7 % — AB (ref 0–5)
Eosinophils Absolute: 0.3 10*3/uL (ref 0.0–0.7)
HCT: 33.8 % — ABNORMAL LOW (ref 39.0–52.0)
Hemoglobin: 10.5 g/dL — ABNORMAL LOW (ref 13.0–17.0)
Lymphocytes Relative: 19 % (ref 12–46)
Lymphs Abs: 0.7 10*3/uL (ref 0.7–4.0)
MCH: 23 pg — ABNORMAL LOW (ref 26.0–34.0)
MCHC: 31.1 g/dL (ref 30.0–36.0)
MCV: 74.1 fL — ABNORMAL LOW (ref 78.0–100.0)
Monocytes Absolute: 0.4 10*3/uL (ref 0.1–1.0)
Monocytes Relative: 11 % (ref 3–12)
Neutro Abs: 2.4 10*3/uL (ref 1.7–7.7)
Neutrophils Relative %: 62 % (ref 43–77)
Platelets: 380 10*3/uL (ref 150–400)
RBC: 4.56 MIL/uL (ref 4.22–5.81)
RDW: 17.7 % — AB (ref 11.5–15.5)
WBC: 3.8 10*3/uL — ABNORMAL LOW (ref 4.0–10.5)

## 2013-12-15 LAB — BASIC METABOLIC PANEL
BUN: 17 mg/dL (ref 6–23)
CALCIUM: 9.3 mg/dL (ref 8.4–10.5)
CO2: 23 meq/L (ref 19–32)
CREATININE: 1.92 mg/dL — AB (ref 0.50–1.35)
Chloride: 100 mEq/L (ref 96–112)
GFR calc Af Amer: 45 mL/min — ABNORMAL LOW (ref >90)
GFR calc non Af Amer: 39 mL/min — ABNORMAL LOW (ref >90)
Glucose, Bld: 120 mg/dL — ABNORMAL HIGH (ref 70–99)
Potassium: 5.2 mEq/L (ref 3.7–5.3)
Sodium: 138 mEq/L (ref 137–147)

## 2013-12-15 LAB — PRO B NATRIURETIC PEPTIDE: PRO B NATRI PEPTIDE: 2019 pg/mL — AB (ref 0–125)

## 2013-12-15 LAB — TROPONIN I: Troponin I: <0.3 ng/mL (ref <0.30)

## 2013-12-15 MED ORDER — SODIUM CHLORIDE 0.9 % IV BOLUS (SEPSIS)
1000.0000 mL | Freq: Once | INTRAVENOUS | Status: AC
  Administered 2013-12-15: 1000 mL via INTRAVENOUS

## 2013-12-15 MED ORDER — SODIUM CHLORIDE 0.9 % IV BOLUS (SEPSIS)
1000.0000 mL | Freq: Once | INTRAVENOUS | Status: AC
Start: 2013-12-15 — End: 2013-12-15
  Administered 2013-12-15: 1000 mL via INTRAVENOUS

## 2013-12-15 NOTE — ED Notes (Addendum)
Pt states that his normal doppler BP is 70-90. Today pt is 55 upon arrival

## 2013-12-15 NOTE — ED Provider Notes (Signed)
4:53 PM Cards has seen the pt, recommends d/c home after IVF.   Clinical Impression 1. Chronic systolic heart failure   2. Orthostatic hypotension   3. LVAD (left ventricular assist device) present   4. Dizziness   5. Chest pain, unspecified chest pain type      Junius Argyle, MD 12/16/13 1719

## 2013-12-15 NOTE — ED Notes (Signed)
Cardiology MD at bedside.

## 2013-12-15 NOTE — ED Notes (Signed)
Connye Burkitt, NP at bedside

## 2013-12-15 NOTE — Consult Note (Signed)
Primary Physician: Dr. Oliver Barre Primary Cardiologist:  Dr. Arvilla Meres  Reason for Consult: Dizziness  HPI:    Brentlee is a 53 yo male with a history of severe CHF, NICM s/p LVAD and TVR (09/2011), VT, NSTEMI, LV thrombus, CKD, GERD, stroke and LVAD hemolysis with LVAD exchange (08/2013).   He was admitted to [facility] January 2015 with NSTEMI and RV failure. He had a difficult course that was complicated by embolic right CVA. He was intubated and eventually had a tracheostomy placed 07/28/13. He was transferred to inpatient rehab for extensive rehab and was making progression, however despite aggressive therapy with heparin, coumadin, ASA and Plavix, his LDH remained persistently elevated in the 950 range (was about 1750 on admit). He did not experience any pump dysfunction, however the case was discussed with Dr Allena Katz and [name] at [facility] and it was felt to transfer him down there for possible pump exchange. He underwent a pump exchange 08/31/13. Post op was complicated by bleeding and he received multiple transfusions. He had AKI and had intermittent HD and underwent last HD on 09/24/13. Last Cr on chart 1.9 (10/02/13)   Admitted 5/13-5/18/15 for volume overload and possible hemolysis. LDH 647 and INR 1.69 on admission. Treated with IV lasix and IV heparin. While in the hospital developed some PI events and speed was decreased to 9400. ICD interrogated and showed A flutter and he was placed on IV amiodarone and then transitioned to 200 mg PO BID. He cardioverted to NSR on 5/15. LDH trended down and on discharge was 530 and INR 2.4.  We have been following Acie closely on the OP side and we have been titrating medications d/t volume overload and high MAP. Last visit was 6/17 and he was doing well. Weight was starting to trend down and was 180 lbs. MAP was elevated 100 and increased hydralazine to 100 mg TID. Reported on Monday he started feeling bad and having dizziness. He reports his weight at home is 166.  Felt like today his heart was "beating hard." Reports he has had increased UOP. Finally taking medications as prescribed. Urine clear and denies missing any coumadin doses, but does report eating some greens this week. His MAP when he presented was 50.   SH: Disabled, lives at home with caregiver. No ETOH or smoking  FH: Mother living; HTN  Father living; no health issues   LVAD INTERROGATION:  HeartMate II LVAD:  Flow 4.6  liters/min, speed 9400, power 5.2, PI 5.6.   Multiple PI events  Review of Systems: [y] = yes, [ ]  = no   General: Weight gain [ ] ; Weight loss [ Y]; Anorexia [ ] ; Fatigue [Y ]; Fever [ ] ; Chills [ ] ; Weakness [ ]   Cardiac: Chest pain/pressure [Y ]; Resting SOB [ N]; Exertional SOB [Y ]; Orthopnea [ N]; Pedal Edema Klaus.Mock ]; Palpitations Gilian.Kraft ]; Syncope [ N]; Presyncope Klaus.Mock ]; Paroxysmal nocturnal dyspnea[ ]   Pulmonary: Cough Klaus.Mock ]; Wheezing[ ] ; Hemoptysis[ ] ; Sputum [ ] ; Snoring [ ]   GI: Vomiting[ ] ; Dysphagia[ ] ; Melena[ ] ; Hematochezia [ ] ; Heartburn[ ] ; Abdominal pain [ ] ; Constipation [ ] ; Diarrhea [ ] ; BRBPR [ ]   GU: Hematuria[ N]; Dysuria [ ] ; Nocturia[ ]   Vascular: Pain in legs with walking [ ] ; Pain in feet with lying flat [ ] ; Non-healing sores [ ] ; Stroke [ ] ; TIA [ ] ; Slurred speech [ ] ;  Neuro: Headaches[ ] ; Vertigo[ ] ; Seizures[ ] ; Paresthesias[ ] ;Blurred vision [ ] ; Diplopia [ ] ;  Vision changes [ ]   Ortho/Skin: Arthritis [ ] ; Joint pain [N ]; Muscle pain [ ] ; Joint swelling [ ] ; Back Pain [ ] ; Rash [ ]   Psych: Depression[ ] ; Anxiety[ ]   Heme: Bleeding problems Klaus.Mock[N ]; Clotting disorders [ ] ; Anemia [ ]   Endocrine: Diabetes [ ] ; Thyroid dysfunction[ ]   Home Medications Prior to Admission medications   Medication Sig Start Date End Date Taking? Authorizing Provider  amiodarone (PACERONE) 200 MG tablet Take 1 tablet (200 mg total) by mouth 2 (two) times daily. 11/08/13  Yes Amy D Clegg, NP  aspirin EC 325 MG EC tablet Take 1 tablet (325 mg total) by mouth daily.  11/08/13  Yes Amy D Clegg, NP  carvedilol (COREG) 3.125 MG tablet Take 1 tablet (3.125 mg total) by mouth 2 (two) times daily with a meal. 11/08/13  Yes Amy D Clegg, NP  clopidogrel (PLAVIX) 75 MG tablet Take 1 tablet (75 mg total) by mouth daily with breakfast. 11/12/13  Yes Aundria RudAli B Cosgrove, NP  furosemide (LASIX) 40 MG tablet Take 1 tablet (40 mg total) by mouth daily. 12/01/13  Yes Aundria RudAli B Cosgrove, NP  hydrALAZINE (APRESOLINE) 100 MG tablet Take 1 tablet (100 mg total) by mouth 3 (three) times daily. 12/08/13  Yes Dolores Pattyaniel R Per Beagley, MD  isosorbide mononitrate (IMDUR) 60 MG 24 hr tablet Take 1 tablet (60 mg total) by mouth daily. 11/16/13  Yes Aundria RudAli B Cosgrove, NP  losartan (COZAAR) 50 MG tablet Take 1 tablet (50 mg total) by mouth daily. 11/08/13  Yes Amy D Clegg, NP  mirtazapine (REMERON) 30 MG tablet Take 30 mg by mouth at bedtime. 10/11/13  Yes Historical Provider, MD  pantoprazole (PROTONIX) 40 MG tablet Take 40 mg by mouth daily.   Yes Historical Provider, MD  potassium chloride SA (K-DUR,KLOR-CON) 20 MEQ tablet Take 20 mEq by mouth 2 (two) times daily.   Yes Historical Provider, MD  sertraline (ZOLOFT) 100 MG tablet Take 100 mg by mouth daily. 10/02/13 10/02/14 Yes Historical Provider, MD  tamsulosin (FLOMAX) 0.4 MG CAPS capsule Take 1 capsule (0.4 mg total) by mouth daily. 08/11/13  Yes Dolores Pattyaniel R Toshio Slusher, MD  warfarin (COUMADIN) 3 MG tablet Take 1 tablet (3 mg total) by mouth daily. 12/08/13  Yes Dolores Pattyaniel R Dequavious Harshberger, MD    Past Medical History: Past Medical History  Diagnosis Date  . CHF (congestive heart failure)     EF- 10-15  . Medically noncompliant   . Mitral regurgitation   . Tobacco user   . HTN (hypertension)   . AICD (automatic cardioverter/defibrillator) present   . GERD (gastroesophageal reflux disease)   . Substance abuse   . Chronic renal insufficiency   . Syncope   . Thrombus 08/06/2010  . SYSTOLIC HEART FAILURE, CHRONIC 09/22/2008    Qualifier: Diagnosis of  By: [name], MD,  Trixie DredgeFACC, Eagle Pitta R   . LV (left ventricular) mural thrombus 01/28/2011  . ICD - IN SITU 09/16/2008    Qualifier: Diagnosis of  By: Wonda AmisBednar, NP-C, Michelle    . MITRAL STENOSIS/ INSUFFICIENCY, NON-RHEUMATIC 09/22/2008    Qualifier: Diagnosis of  By: [name], MD, Trixie DredgeFACC, Haralambos Yeatts R   . Hepatomegaly 09/16/2008    Qualifier: Diagnosis of  By: Wonda AmisBednar, NP-C, Michelle    . High cholesterol 02/26/2012    "at one time"  . Sleep apnea   . Exertional dyspnea 02/26/2012  . History of blood transfusion 08/2011    "when I had heart pump"  . Migraines   . COMMON MIGRAINE  06/14/2009    Qualifier: Diagnosis of  By: Jonny Ruiz MD, Len Blalock   . History of gout 02/26/2012  . Depression 08/11/2013    Pt denies  . Bipolar affective disorder 10/22/2011    pt denies this hx 02/26/2012    Past Surgical History: Past Surgical History  Procedure Laterality Date  . Cardiac defibrillator placement  ~ 2008  . Left ventricular assist device  08/2011  . Radiology with anesthesia N/A 07/19/2013    Procedure: RADIOLOGY WITH ANESTHESIA;  Surgeon: Oneal Grout, MD;  Location: [facility] OR;  Service: Radiology;  Laterality: N/A;  . Tracheostomy      feinstein    Family History: Family History  Problem Relation Age of Onset  . Coronary artery disease Neg Hx     Social History: History   Social History  . Marital Status: Divorced    Spouse Name: N/A    Number of Children: N/A  . Years of Education: N/A   Social History Main Topics  . Smoking status: Former Smoker -- 0.25 packs/day for 33 years    Types: Cigarettes    Quit date: 06/29/2011  . Smokeless tobacco: Former Neurosurgeon    Quit date: 06/29/2011  . Alcohol Use: No  . Drug Use: No     Comment: denies  . Sexual Activity: Not Currently   Other Topics Concern  . None   Social History Narrative  . None    Allergies:  Allergies  Allergen Reactions  . Ace Inhibitors Cough  . Lexapro [Escitalopram Oxalate] Other (See Comments)    somnolence    Objective:    Vital  Signs:   Temp:  [97.9 F (36.6 C)] 97.9 F (36.6 C) (06/24 1517) Pulse Rate:  [86] 86 (06/24 1517) Resp:  [16] 16 (06/24 1517) SpO2:  [97 %] 97 % (06/24 1517)   There were no vitals filed for this visit.  Mean arterial Pressure 50  Physical Exam: General:  Well appearing. No resp difficulty; fatigue HEENT: normal Neck: supple. JVP flat; Carotids 2+ bilat; no bruits. No lymphadenopathy or thryomegaly appreciated. Cor: Mechanical heart sounds with LVAD hum present. Lungs: clear Abdomen: soft, nontender, nondistended. No hepatosplenomegaly. No bruits or masses. Good bowel sounds. Driveline: C/D/I; securement device intact and driveline incorporated Extremities: no cyanosis, clubbing, rash, no edema Neuro: alert & orientedx3, cranial nerves grossly intact. moves all 4 extremities w/o difficulty. Affect pleasant  Telemetry: SR 80s  Labs: Basic Metabolic Panel: No results found for this basename: NA, K, CL, CO2, GLUCOSE, BUN, CREATININE, CALCIUM, MG, PHOS,  in the last 168 hours  Liver Function Tests: No results found for this basename: AST, ALT, ALKPHOS, BILITOT, PROT, ALBUMIN,  in the last 168 hours No results found for this basename: LIPASE, AMYLASE,  in the last 168 hours No results found for this basename: AMMONIA,  in the last 168 hours  CBC:  Recent Labs Lab 12/15/13 1503  WBC 3.8*  NEUTROABS 2.4  HGB 10.5*  HCT 33.8*  MCV 74.1*  PLT 380    Cardiac Enzymes: No results found for this basename: CKTOTAL, CKMB, CKMBINDEX, TROPONINI,  in the last 168 hours  BNP: BNP (last 3 results)  Recent Labs  11/16/13 1417 12/01/13 1339 12/08/13 1039  PROBNP 2378.0* 2172.0* 2142.0*    CBG: No results found for this basename: GLUCAP,  in the last 168 hours  Coagulation Studies: No results found for this basename: LABPROT, INR,  in the last 72 hours  Other results: EKG: SR 86  Imaging: Dg Chest Port 1 View  12/15/2013   CLINICAL DATA:  Chest pain shortness of  Breath  EXAM: PORTABLE CHEST - 1 VIEW  COMPARISON:  08/19/2013  FINDINGS: A defibrillator is again noted as is a left ventricular assist device. The cardiac shadow remains mildly enlarged. Postsurgical changes are seen. The lungs are well aerated with the exception of some minimal scarring in the left base. No bony abnormality is noted.  IMPRESSION: No acute abnormality seen.   Electronically Signed   By: Alcide Clever M.D.   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 Mcaffee is a 53 yo male well known to the HF team who presents to the ED for dizziness and heart palpitations.   He was just seen in the HF clinic on 6/17 and his hydralazine was increased to 100 mg TID for MAP of 100. Of note he has been struggling with taking his medications correctly and over the past few visits his diuretics and anti-hypertensive's have been increased for volume overload and HTN. Today he presents with a MAP in the 50s and his weight is down 17 lbs. He is dehydrated and hypotensive. Will give 1L NS and will reassess.   He has had multiple PI events ~ 15 in the past 12 hours and he usually has about 0-2 a day. He is also having some NSVT likely related to his volume status.   Will need to cut hydralazine back to 50 mg TID and hold diuretics for a few days.   Check BMET, LDH, CBC, pro-BNP and INR.  I reviewed the LVAD parameters from today, and compared the results to the patient's prior recorded data.  No programming changes were made.  The LVAD is functioning within specified parameters.  The patient performs LVAD self-test daily.  LVAD interrogation was negative for any significant power changes, alarms or PI events/speed drops.  LVAD equipment check completed and is  in good working order.  Back-up equipment present.   LVAD education done on emergency procedures and precautions and reviewed exit site care.  Length of Stay: 0  [name] NP-C 12/15/2013, 3:27 PM  VAD Team Pager [phone] (7am - 7am) +++VAD ISSUES ONLY+++  Advanced Heart Failure Team Pager [phone] (M-F; 7a - 4p)  Please contact [facility] for night-coverage after hours (4p -7a ) and weekends on [email or web address] for all non- LVAD Issues   Patient seen and examined with [name], NP. We discussed all aspects of the encounter. I agree with the assessment and plan as stated above.   He is volume depleted and hypotensive in setting of resuming his medications. Will give 2L NS. Then adjust his meds. Cut lasix to 20 daily (resume on Friday). Cut hydralazine to 50mg  tid. Stop KCL. See back in Clinic next week.   VAD parameters reviewed personally.   Reuel Boom Sheniece Ruggles,MD 4:47 PM

## 2013-12-15 NOTE — ED Notes (Signed)
Pt states that he has had dizziness for 3 days. Pt states that he also has chest pain. Pt states that it is more chest pressure. Symptoms have worsened today. Pt alert and oriented. LVAD in place. Insertion site clean and dry. Pt states that he did eat collards 2 days ago and takes coumadin. Pt states that dizziness is worse with standing.

## 2013-12-15 NOTE — ED Notes (Signed)
Pt states that he gets "tired and dizzy real fast".

## 2013-12-15 NOTE — ED Provider Notes (Addendum)
CSN: [id number]     Arrival date & time 12/15/13  1431 History   First MD Initiated Contact with Patient 12/15/13 1434     Chief Complaint  Patient presents with  . Dizziness  . Chest Pain     (Consider location/radiation/quality/duration/timing/severity/associated sxs/prior Treatment) HPI Comments: Patient presents to the ER by EMS for evaluation of chest pain. Patient reports that he has not been feeling well for the last 3 days. Patient reports weakness and dizziness over this time period. Today he developed chest pain. Patient reports moderate to severe pressure over the chest. He is not short of breath.  Patient is a 53 y.o. male presenting with dizziness and chest pain.  Dizziness Associated symptoms: chest pain   Chest Pain Associated symptoms: dizziness     Past Medical History  Diagnosis Date  . CHF (congestive heart failure)     EF- 10-15  . Medically noncompliant   . Mitral regurgitation   . Tobacco user   . HTN (hypertension)   . AICD (automatic cardioverter/defibrillator) present   . GERD (gastroesophageal reflux disease)   . Substance abuse   . Chronic renal insufficiency   . Syncope   . Thrombus 08/06/2010  . SYSTOLIC HEART FAILURE, CHRONIC 09/22/2008    Qualifier: Diagnosis of  By: Gala Romney, MD, Trixie Dredge   . LV (left ventricular) mural thrombus 01/28/2011  . ICD - IN SITU 09/16/2008    Qualifier: Diagnosis of  By: Wonda Amis    . MITRAL STENOSIS/ INSUFFICIENCY, NON-RHEUMATIC 09/22/2008    Qualifier: Diagnosis of  By: Gala Romney, MD, Trixie Dredge Hepatomegaly 09/16/2008    Qualifier: Diagnosis of  By: Wonda Amis    . High cholesterol 02/26/2012    "at one time"  . Sleep apnea   . Exertional dyspnea 02/26/2012  . History of blood transfusion 08/2011    "when I had heart pump"  . Migraines   . COMMON MIGRAINE 06/14/2009    Qualifier: Diagnosis of  By: Jonny Ruiz MD, Len Blalock   . History of gout 02/26/2012  . Depression 08/11/2013    Pt  denies  . Bipolar affective disorder 10/22/2011    pt denies this hx 02/26/2012   Past Surgical History  Procedure Laterality Date  . Cardiac defibrillator placement  ~ 2008  . Left ventricular assist device  08/2011  . Radiology with anesthesia N/A 07/19/2013    Procedure: RADIOLOGY WITH ANESTHESIA;  Surgeon: Oneal Grout, MD;  Location: [facility] OR;  Service: Radiology;  Laterality: N/A;  . Tracheostomy      feinstein   Family History  Problem Relation Age of Onset  . Coronary artery disease Neg Hx    History  Substance Use Topics  . Smoking status: Former Smoker -- 0.25 packs/day for 33 years    Types: Cigarettes    Quit date: 06/29/2011  . Smokeless tobacco: Former Neurosurgeon    Quit date: 06/29/2011  . Alcohol Use: No    Review of Systems  Cardiovascular: Positive for chest pain.  Neurological: Positive for dizziness.  All other systems reviewed and are negative.     Allergies  Ace inhibitors and Lexapro  Home Medications   Prior to Admission medications   Medication Sig Start Date End Date Taking? Authorizing Provider  amiodarone (PACERONE) 200 MG tablet Take 1 tablet (200 mg total) by mouth 2 (two) times daily. 11/08/13  Yes Amy D Clegg, NP  aspirin EC 325 MG EC tablet Take 1  tablet (325 mg total) by mouth daily. 11/08/13  Yes Amy D Clegg, NP  carvedilol (COREG) 3.125 MG tablet Take 1 tablet (3.125 mg total) by mouth 2 (two) times daily with a meal. 11/08/13  Yes Amy D Clegg, NP  clopidogrel (PLAVIX) 75 MG tablet Take 1 tablet (75 mg total) by mouth daily with breakfast. 11/12/13  Yes [name], NP  furosemide (LASIX) 40 MG tablet Take 1 tablet (40 mg total) by mouth daily. 12/01/13  Yes [name], NP  isosorbide mononitrate (IMDUR) 60 MG 24 hr tablet Take 1 tablet (60 mg total) by mouth daily. 11/16/13  Yes [name], NP  mirtazapine (REMERON) 30 MG tablet Take 30 mg by mouth at bedtime. 10/11/13  Yes Historical Provider, MD  sertraline (ZOLOFT) 100 MG tablet  Take 100 mg by mouth daily. 10/02/13 10/02/14 Yes Historical Provider, MD  tamsulosin (FLOMAX) 0.4 MG CAPS capsule Take 1 capsule (0.4 mg total) by mouth daily. 08/11/13  Yes Dolores Patty, MD  warfarin (COUMADIN) 3 MG tablet Take 1 tablet (3 mg total) by mouth daily. 12/08/13  Yes Dolores Patty, MD  hydrALAZINE (APRESOLINE) 100 MG tablet Take 50 mg by mouth 3 (three) times daily. 12/08/13   Dolores Patty, MD  losartan (COZAAR) 50 MG tablet Take 50 mg by mouth 2 (two) times daily. 11/08/13   Amy D Clegg, NP   BP   Pulse 87  Temp(Src) 98 F (36.7 C) (Oral)  Resp 16  Wt 163 lb 9.6 oz (74.208 kg)  SpO2 98% Physical Exam  Constitutional: He is oriented to person, place, and time. He appears well-developed and well-nourished. No distress.  HENT:  Head: Normocephalic and atraumatic.  Right Ear: Hearing normal.  Left Ear: Hearing normal.  Nose: Nose normal.  Mouth/Throat: Oropharynx is clear and moist and mucous membranes are normal.  Eyes: Conjunctivae and EOM are normal. Pupils are equal, round, and reactive to light.  Neck: Normal range of motion. Neck supple.  Cardiovascular: Regular rhythm, S1 normal and S2 normal.  Exam reveals no gallop and no friction rub.   No murmur heard. Pulmonary/Chest: Effort normal and breath sounds normal. No respiratory distress. He exhibits no tenderness.  Abdominal: Soft. Normal appearance and bowel sounds are normal. There is no hepatosplenomegaly. There is no tenderness. There is no rebound, no guarding, no tenderness at McBurney's point and negative Murphy's sign. No hernia.  Musculoskeletal: Normal range of motion.  Neurological: He is alert and oriented to person, place, and time. He has normal strength. No cranial nerve deficit or sensory deficit. Coordination normal. GCS eye subscore is 4. GCS verbal subscore is 5. GCS motor subscore is 6.  Skin: Skin is warm, dry and intact. No rash noted. No cyanosis.  Psychiatric: He has a normal mood and  affect. His speech is normal and behavior is normal. Thought content normal.    ED Course  Procedures (including critical care time) Labs Review Labs Reviewed  CBC WITH DIFFERENTIAL - Abnormal; Notable for the following:    WBC 3.8 (*)    Hemoglobin 10.5 (*)    HCT 33.8 (*)    MCV 74.1 (*)    MCH 23.0 (*)    RDW 17.7 (*)    Eosinophils Relative 7 (*)    All other components within normal limits  BASIC METABOLIC PANEL - Abnormal; Notable for the following:    Glucose, Bld 120 (*)    Creatinine, Ser 1.92 (*)    GFR  calc non Af Amer 39 (*)    GFR calc Af Amer 45 (*)    All other components within normal limits  PRO B NATRIURETIC PEPTIDE - Abnormal; Notable for the following:    Pro B Natriuretic peptide (BNP) 2019.0 (*)    All other components within normal limits  TROPONIN I    Imaging Review No results found.   EKG Interpretation None      MDM   Final diagnoses:  Dizziness  Chest pain, unspecified chest pain type   Patient presents to the ER for evaluation of chest pain. Patient is currently an LVAD patient. His vital signs are stable and he is in no distress. Initial cardiac workup negative, cardiology to see and determine management.    Gilda Creasehristopher J. Pollina, MD 12/16/13 [id number]  Gilda Creasehristopher J. Pollina, MD 12/29/13 [phone]

## 2013-12-17 NOTE — Progress Notes (Signed)
VAD coordinator paged to ED for arrival of VAD patient. VAD equipment delivered, VAD interrogation revealed: Flow:  4.5 Speed:  9400 Power:  5.1 PI: 5.4 Alarms:  None Events:  Multiple PI events  Doppler MAP 50.  Pt states his blood may be "too thick", took extra coumadin dose this am. Unsure of dosage. [name], NP updated - will check labs today and repeat INR at next clinic visit on 12/22/13.

## 2013-12-21 ENCOUNTER — Other Ambulatory Visit (HOSPITAL_COMMUNITY): Payer: Self-pay | Admitting: *Deleted

## 2013-12-21 DIAGNOSIS — Z7901 Long term (current) use of anticoagulants: Secondary | ICD-10-CM

## 2013-12-21 DIAGNOSIS — Z95811 Presence of heart assist device: Secondary | ICD-10-CM

## 2013-12-22 ENCOUNTER — Ambulatory Visit (HOSPITAL_COMMUNITY): Payer: Self-pay | Admitting: *Deleted

## 2013-12-22 ENCOUNTER — Ambulatory Visit (HOSPITAL_COMMUNITY)
Admission: RE | Admit: 2013-12-22 | Discharge: 2013-12-22 | Disposition: A | Discharge status: Home / Self Care | Payer: Medicare Other | Source: Ambulatory Visit | Attending: Internal Medicine | Admitting: Internal Medicine

## 2013-12-22 VITALS — BP 110/0 | HR 79 | Ht 64.0 in | Wt 177.4 lb

## 2013-12-22 DIAGNOSIS — I10 Essential (primary) hypertension: Secondary | ICD-10-CM

## 2013-12-22 DIAGNOSIS — I5022 Chronic systolic (congestive) heart failure: Secondary | ICD-10-CM | POA: Diagnosis not present

## 2013-12-22 DIAGNOSIS — Z95811 Presence of heart assist device: Secondary | ICD-10-CM

## 2013-12-22 DIAGNOSIS — R7402 Elevation of levels of lactic acid dehydrogenase (LDH): Secondary | ICD-10-CM | POA: Diagnosis not present

## 2013-12-22 DIAGNOSIS — I509 Heart failure, unspecified: Secondary | ICD-10-CM

## 2013-12-22 DIAGNOSIS — I6322 Cerebral infarction due to unspecified occlusion or stenosis of basilar arteries: Secondary | ICD-10-CM

## 2013-12-22 DIAGNOSIS — R7401 Elevation of levels of liver transaminase levels: Secondary | ICD-10-CM

## 2013-12-22 DIAGNOSIS — Z7901 Long term (current) use of anticoagulants: Secondary | ICD-10-CM | POA: Diagnosis not present

## 2013-12-22 DIAGNOSIS — R740 Nonspecific elevation of levels of transaminase and lactic acid dehydrogenase [LDH]: Secondary | ICD-10-CM

## 2013-12-22 DIAGNOSIS — Z79899 Other long term (current) drug therapy: Secondary | ICD-10-CM

## 2013-12-22 LAB — BASIC METABOLIC PANEL
Anion gap: 18 — ABNORMAL HIGH (ref 5–15)
BUN: 13 mg/dL (ref 6–23)
CALCIUM: 9.3 mg/dL (ref 8.4–10.5)
CO2: 18 mEq/L — ABNORMAL LOW (ref 19–32)
CREATININE: 1.11 mg/dL (ref 0.50–1.35)
Chloride: 102 mEq/L (ref 96–112)
GFR calc Af Amer: 88 mL/min — ABNORMAL LOW (ref >90)
GFR, EST NON AFRICAN AMERICAN: 76 mL/min — AB (ref >90)
GLUCOSE: 86 mg/dL (ref 70–99)
Potassium: 4.3 mEq/L (ref 3.7–5.3)
Sodium: 138 mEq/L (ref 137–147)

## 2013-12-22 LAB — CBC
HCT: 34.9 % — ABNORMAL LOW (ref 39.0–52.0)
HEMOGLOBIN: 10.7 g/dL — AB (ref 13.0–17.0)
MCH: 22.9 pg — AB (ref 26.0–34.0)
MCHC: 30.7 g/dL (ref 30.0–36.0)
MCV: 74.7 fL — AB (ref 78.0–100.0)
Platelets: 301 10*3/uL (ref 150–400)
RBC: 4.67 MIL/uL (ref 4.22–5.81)
RDW: 18.2 % — ABNORMAL HIGH (ref 11.5–15.5)
WBC: 3.4 10*3/uL — ABNORMAL LOW (ref 4.0–10.5)

## 2013-12-22 LAB — PRO B NATRIURETIC PEPTIDE: Pro B Natriuretic peptide (BNP): 1492 pg/mL — ABNORMAL HIGH (ref 0–125)

## 2013-12-22 LAB — PROTIME-INR
INR: 2.1 — ABNORMAL HIGH (ref 0.00–1.49)
Prothrombin Time: 23.6 seconds — ABNORMAL HIGH (ref 11.6–15.2)

## 2013-12-22 LAB — LACTATE DEHYDROGENASE: LDH: 628 U/L — AB (ref 94–250)

## 2013-12-22 NOTE — Progress Notes (Signed)
Symptom  Yes  No  Details   Angina         x Activity:   Claudication         x How far:   Syncope         x When:   Stroke        x    Orthopnea         x How many pillows: one  PND         x How often:  CPAP      N/A How many hrs:   Pedal edema         x   Abd fullness         x   N&V         x  good appetite; eats full meals  Diaphoresis         x When:  Bleeding        x   Urine color     yellow  SOB         x  Activity:  Walking level ground 1/4 block  Palpitations         x When:  ICD shock         x   Hospitlizaitons                x When/where/why:    ED visit         x When/where/why:  Other MD         x When/who/why:  Activity           Limited; playing video games  Fluid           No limitations "drinking 24 oz day"  Diet     No limitations   Vital signs: HR:  79 MAP BP:  110 O2 Sat: 99 Wt: 177.4 lbs   Last wt: 180.2 lbs Ht: 5'4"  LVAD interrogation reveals:  Speed:  9400 Flow:  3.5 Power:  4.8 PI:  5.7 Alarms: none Events:  0 - 5 PI events Fixed speed:  9400 Low speed limit: 8800   LVAD exit site:  Well healed and incorporated. The velour is fully implanted at exit site. SorbaView dressing present, and applied correctly. Pt doing his own dressing changes.Stabilization device present.    I reviewed the LVAD parameters from today, and compared the results to the patient's prior recorded data. The LVAD is functioning within specified parameters.  LVAD interrogation was negative for any significant power changes; or significant low flow alarms. A few PI events/speed drops present which is consistent with the patient's history.    Pt/caregiver brought meds to clinic visit today as instructed and is allowing Tenisha to help fill pill box. Refills called for meds.    LVAD equipment check completed and is in good working order. Back-up equipment present. LVAD education done on emergency procedures and precautions and reviewed exit site care.

## 2013-12-22 NOTE — Addendum Note (Signed)
Encounter addended by: Hessie Diener, RN on: 12/22/2013  2:43 PM<BR>     Documentation filed: Notes Section

## 2013-12-22 NOTE — Addendum Note (Signed)
Encounter addended by: [name], NP on: 12/22/2013  5:20 PM<BR>     Documentation filed: Charges VN

## 2013-12-22 NOTE — Patient Instructions (Addendum)
1. Stop potassium  2. Increase Losartan to 50 mg twice daily 3. Will call with INR results and coumadin dosing 4. Return to VAD clinic in two weeks

## 2013-12-22 NOTE — Progress Notes (Signed)
Patient ID: Mateus Mcfarling, male   DOB: [birth year], 53 y.o.   MRN: [id number]  Primary Physician: Dr. Oliver Barre  Primary Cardiologist: Dr. 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   He was admitted to [facility] January 2015 with NSTEMI and RV failure. He had a difficult course that was complicated by embolic right CVA. He was intubated and eventually had a tracheostomy placed 07/28/13. He was transferred to inpatient rehab for extensive rehab and was making progression, however despite aggressive therapy with heparin, coumadin, ASA and Plavix, his LDH remained persistently elevated in the 950 range (was about 1750 on admit). He did not experience any pump dysfunction, however the case was discussed with Dr Allena Katz and [name] at [facility] and it was felt to transfer him down there for possible pump exchange. He underwent a pump exchange 08/31/13. Post op was complicated by bleeding and he received multiple transfusions. He had AKI and had intermittent HD and underwent last HD on 09/24/13. Last Cr on chart 1.9 (10/02/13)  Admitted 5/13-5/18/15 for volume overload and possible hemolysis. LDH 647 and INR 1.69 on admission. Treated with IV lasix and IV heparin. While in the hospital developed some PI events and speed was decreased to 9400. ICD interrogated and showed A flutter and he was placed on IV amiodarone and then transitioned to 200 mg PO BID. He cardioverted to NSR on 5/15. LDH trended down and on discharge was 530 and INR 2.4.  Follow up for Heart Failure and LVAD: Last visit increased hydralazine to 100 mg TID. He presented to the Ed for hypotension with 17 lbs weight loss in 1 week likely related to finally taking medications correctly. He received 2L NS and cut lasix back to 20 mg daily and hydralazine to 50 gm TID. Stopped potassium. Reports feeling good. Weight back up  from ED to 177 lbs. Denies SOB, orthopnea, CP or edema. Taking medications as prescribed I believe.   SH: Disabled, lives at home with caregiver. No ETOH or smoking FH: Mother living; HTN        Father living; no health issues   No alarms.  Denies driveline trauma, erythema or drainage.  Denies ICD shocks.   Reports taking Coumadin as prescribed and adherence to anticoagulation based dietary restrictions.  Denies bright red blood per rectum or melena, no dark urine or hematuria.     Past Medical History  Diagnosis Date  . CHF (congestive heart failure)     EF- 10-15  . Medically noncompliant   . Mitral regurgitation   . Tobacco user   . HTN (hypertension)   . AICD (automatic cardioverter/defibrillator) present   . GERD (gastroesophageal reflux disease)   . Substance abuse   . Chronic renal insufficiency   . Syncope   . Thrombus 08/06/2010  . SYSTOLIC HEART FAILURE, CHRONIC 09/22/2008    Qualifier: Diagnosis of  By: Gala Romney, MD, Trixie Dredge   . LV (left ventricular) mural thrombus 01/28/2011  . ICD - IN SITU 09/16/2008    Qualifier: Diagnosis of  By: Wonda Amis    . MITRAL STENOSIS/ INSUFFICIENCY, NON-RHEUMATIC 09/22/2008    Qualifier: Diagnosis of  By: Gala Romney, MD, Trixie Dredge Hepatomegaly 09/16/2008    Qualifier: Diagnosis of  By: Wonda Amis    . High cholesterol 02/26/2012    "  at one time"  . Sleep apnea   . Exertional dyspnea 02/26/2012  . History of blood transfusion 08/2011    "when I had heart pump"  . Migraines   . COMMON MIGRAINE 06/14/2009    Qualifier: Diagnosis of  By: Jonny Ruiz MD, Len Blalock   . History of gout 02/26/2012  . Depression 08/11/2013    Pt denies  . Bipolar affective disorder 10/22/2011    pt denies this hx 02/26/2012    Current Outpatient Prescriptions  Medication Sig Dispense Refill  . amiodarone (PACERONE) 200 MG tablet Take 1 tablet (200 mg total) by mouth 2 (two) times daily.  60 tablet  6  . carvedilol (COREG) 3.125 MG  tablet Take 1 tablet (3.125 mg total) by mouth 2 (two) times daily with a meal.  60 tablet  6  . clopidogrel (PLAVIX) 75 MG tablet Take 1 tablet (75 mg total) by mouth daily with breakfast.  30 tablet  6  . furosemide (LASIX) 40 MG tablet Take 1 tablet (40 mg total) by mouth daily.  30 tablet  3  . hydrALAZINE (APRESOLINE) 100 MG tablet Take 50 mg by mouth 3 (three) times daily.      . isosorbide mononitrate (IMDUR) 60 MG 24 hr tablet Take 1 tablet (60 mg total) by mouth daily.  60 tablet  6  . losartan (COZAAR) 50 MG tablet Take 1 tablet (50 mg total) by mouth daily.  30 tablet  6  . mirtazapine (REMERON) 30 MG tablet Take 30 mg by mouth at bedtime.      Marland Kitchen warfarin (COUMADIN) 3 MG tablet Take 1 tablet (3 mg total) by mouth daily.  60 tablet  6  . aspirin EC 325 MG EC tablet Take 1 tablet (325 mg total) by mouth daily.  30 tablet  6  . sertraline (ZOLOFT) 100 MG tablet Take 100 mg by mouth daily.      . tamsulosin (FLOMAX) 0.4 MG CAPS capsule Take 1 capsule (0.4 mg total) by mouth daily.  30 capsule     No current facility-administered medications for this encounter.   Facility-Administered Medications Ordered in Other Encounters  Medication Dose Route Frequency Provider Last Rate Last Dose  . sodium chloride 0.9 % injection 10-40 mL  10-40 mL Intracatheter PRN [name], MD        Ace inhibitors and Lexapro  REVIEW OF SYSTEMS: All systems negative except as listed in HPI, PMH and Problem list.   LVAD INTERROGATION:   HeartMate II LVAD: Flow 3.3 liters/min Speed 9400 Power 5.0 PI 6.2 PI events: 0-5 a day, on 6/26-6/28 10-20 events and one low flow  I reviewed the LVAD parameters from today, and compared the results to the patient's prior recorded data. No changes to LVAD speed. The LVAD is functioning within specified parameters.  The patient performs LVAD self-test daily.  LVAD interrogation was negative for any significant power changes, alarms or PI events/speed drops.   LVAD equipment check completed and is in good working order.  Back-up equipment present.   LVAD education done on emergency procedures and precautions and reviewed exit site care.    Filed Vitals:   12/22/13 1212  BP: 110/0  Pulse: 79  Height: 5\' 4"  (1.626 m)  Weight: 177 lb 6.4 oz (80.468 kg)  SpO2: 99%    Physical Exam: GENERAL: Fatigued appearing, male who presents to clinic today HEENT: normal  NECK: Supple, JVP flat, .2+ bilaterally, no bruits.  No lymphadenopathy or thyromegaly appreciated.  CARDIAC:  Mechanical heart sounds with LVAD hum present.  LUNGS:  Clear to auscultation bilaterally.  ABDOMEN:  Soft, round, nontender, positive bowel sounds x4.     LVAD exit site: well-healed and incorporated.  No dressing.  No erythema or drainage.  Stabilization device present and accurately applied. Dressing being changed weekly. EXTREMITIES:  Warm and dry, no cyanosis, clubbing, rash, trace edema NEUROLOGIC:  Alert and oriented x 4.  Gait steady.  No aphasia.  No dysarthria.  Affect pleasant.     ASSESSMENT AND PLAN:   1) Chronic systolic HF: NICM, s/p LVAD implant (08/2011) and subsequent (08/2013) - Recently seen in the ED for hypotension and volume depletion weight was down 17 lbs. Hydralazine was cut back to 50 mg TID, potassium stopped and lasix held for 3 days and then restarted 40 mg daily. - NYHA II symptoms and volume status improved. Will continue lasix 40 mg daily. Instructed to call if his weight starts trending up or down.  - MAP elevated however did not take his medications today. Will increased losartan to 50 mg BID. Repeat BMET next Thursday.  - On low dose coreg 3.125 mg BID. Will not titrate with RV failure. - Continue hydralazine 50 gm TID and Imdur 60 mg daily.  -  Reinforced the need and importance of daily weights, a low sodium diet, and fluid restriction (less than 2 L a day). Instructed to call the HF clinic if weight increases more than 3 lbs overnight or 5 lbs  in a week.  2) LVAD, s/p (08/2011) and then (08/2013) d/t pump thrombosis - Doing better.  - As above MAP slightly elevated will increase losartan to 50 mg BID and come by next week for repeat BMET.  - Check CBC, BMET, LDH, INR and pro-BNP today. - RAMP ECHO (10/2013) and RV moderately HK - LVAD parameters within normal limits.  3) HTN - as above 4) Hemolysis - Check LDH. Will continue plavix, coumadin and ASA. No s/s of bleeding 5) CVA - Continues to improve. Still has L arm deficits. Encouraged to continue to walk and be more active.  - Will need to start statin next visit. Continue ASA and Plavix.  6) Atrial Flutter: - NSR in the 80s in the ED. Will continue amio 200 mg BID and can consider decreasing next visit.  7) Anticoagulation management - Check INR. Goal 2.0-3.0. Will adjust coumadin accordingly.  8) RV failure - Fluid status much improved.   F/U 2 weeks. BMEt next week.  Ulla Potashosgrove, Ellie Bryand B NP-C 12:28 PM

## 2013-12-29 ENCOUNTER — Other Ambulatory Visit (HOSPITAL_COMMUNITY): Payer: Self-pay | Admitting: *Deleted

## 2013-12-29 DIAGNOSIS — Z7901 Long term (current) use of anticoagulants: Secondary | ICD-10-CM

## 2013-12-29 DIAGNOSIS — Z95811 Presence of heart assist device: Secondary | ICD-10-CM

## 2013-12-30 ENCOUNTER — Telehealth (HOSPITAL_COMMUNITY): Payer: Self-pay | Admitting: *Deleted

## 2013-12-30 ENCOUNTER — Ambulatory Visit (HOSPITAL_COMMUNITY): Payer: Self-pay | Admitting: *Deleted

## 2013-12-30 ENCOUNTER — Ambulatory Visit (HOSPITAL_COMMUNITY)
Admission: RE | Admit: 2013-12-30 | Discharge: 2013-12-30 | Disposition: A | Discharge status: Home / Self Care | Payer: Medicare Other | Source: Ambulatory Visit | Attending: Internal Medicine | Admitting: Internal Medicine

## 2013-12-30 DIAGNOSIS — I5022 Chronic systolic (congestive) heart failure: Secondary | ICD-10-CM

## 2013-12-30 DIAGNOSIS — Z95811 Presence of heart assist device: Secondary | ICD-10-CM

## 2013-12-30 DIAGNOSIS — Z7901 Long term (current) use of anticoagulants: Secondary | ICD-10-CM

## 2013-12-30 LAB — PROTIME-INR
INR: 1.33 (ref 0.00–1.49)
Prothrombin Time: 16.5 seconds — ABNORMAL HIGH (ref 11.6–15.2)

## 2013-12-30 LAB — BASIC METABOLIC PANEL
Anion gap: 15 (ref 5–15)
BUN: 10 mg/dL (ref 6–23)
CO2: 24 meq/L (ref 19–32)
CREATININE: 1.04 mg/dL (ref 0.50–1.35)
Calcium: 8.6 mg/dL (ref 8.4–10.5)
Chloride: 103 mEq/L (ref 96–112)
GFR calc non Af Amer: 82 mL/min — ABNORMAL LOW (ref >90)
Glucose, Bld: 148 mg/dL — ABNORMAL HIGH (ref 70–99)
Potassium: 2.9 mEq/L — CL (ref 3.7–5.3)
Sodium: 142 mEq/L (ref 137–147)

## 2013-12-30 MED ORDER — ENOXAPARIN SODIUM 80 MG/0.8ML ~~LOC~~ SOLN: 80.0000 mg | Freq: Two times a day (BID) | SUBCUTANEOUS | Status: DC

## 2013-12-30 NOTE — Telephone Encounter (Signed)
Called pt per [name], NP and instructed him to take K-Dur 80 meq today then resume 20 meq daily based on K+ of 2.9 today. Pt verbalized understanding of same.  Asked pt to call me back from friend's (she was present) phone and confirm potassium, coumadin, and lovenox instructions (pt concerned about his available minutes on his phone). Pt verbalized understanding of above and agreement to call back.

## 2014-01-04 ENCOUNTER — Inpatient Hospital Stay (HOSPITAL_COMMUNITY): Admission: RE | Admit: 2014-01-04 | Payer: Medicare Other | Source: Ambulatory Visit

## 2014-01-05 ENCOUNTER — Ambulatory Visit (HOSPITAL_COMMUNITY): Payer: Self-pay | Admitting: *Deleted

## 2014-01-05 ENCOUNTER — Other Ambulatory Visit (HOSPITAL_COMMUNITY): Payer: Self-pay | Admitting: *Deleted

## 2014-01-05 ENCOUNTER — Ambulatory Visit (HOSPITAL_COMMUNITY)
Admission: RE | Admit: 2014-01-05 | Discharge: 2014-01-05 | Disposition: A | Discharge status: Home / Self Care | Payer: Medicare Other | Source: Ambulatory Visit | Attending: Cardiology | Admitting: Cardiology

## 2014-01-05 VITALS — BP 84/0 | HR 95 | Ht 64.0 in | Wt 179.4 lb

## 2014-01-05 DIAGNOSIS — I5022 Chronic systolic (congestive) heart failure: Secondary | ICD-10-CM | POA: Diagnosis not present

## 2014-01-05 DIAGNOSIS — N189 Chronic kidney disease, unspecified: Secondary | ICD-10-CM | POA: Insufficient documentation

## 2014-01-05 DIAGNOSIS — Z95811 Presence of heart assist device: Secondary | ICD-10-CM

## 2014-01-05 DIAGNOSIS — R05 Cough: Secondary | ICD-10-CM

## 2014-01-05 DIAGNOSIS — D65 Disseminated intravascular coagulation [defibrination syndrome]: Secondary | ICD-10-CM | POA: Diagnosis not present

## 2014-01-05 DIAGNOSIS — Z7901 Long term (current) use of anticoagulants: Secondary | ICD-10-CM | POA: Diagnosis not present

## 2014-01-05 DIAGNOSIS — F3289 Other specified depressive episodes: Secondary | ICD-10-CM | POA: Insufficient documentation

## 2014-01-05 DIAGNOSIS — I6359 Cerebral infarction due to unspecified occlusion or stenosis of other cerebral artery: Secondary | ICD-10-CM

## 2014-01-05 DIAGNOSIS — R059 Cough, unspecified: Secondary | ICD-10-CM

## 2014-01-05 DIAGNOSIS — IMO0002 Reserved for concepts with insufficient information to code with codable children: Secondary | ICD-10-CM

## 2014-01-05 DIAGNOSIS — I4892 Unspecified atrial flutter: Secondary | ICD-10-CM | POA: Diagnosis not present

## 2014-01-05 DIAGNOSIS — I129 Hypertensive chronic kidney disease with stage 1 through stage 4 chronic kidney disease, or unspecified chronic kidney disease: Secondary | ICD-10-CM | POA: Insufficient documentation

## 2014-01-05 DIAGNOSIS — I6350 Cerebral infarction due to unspecified occlusion or stenosis of unspecified cerebral artery: Secondary | ICD-10-CM

## 2014-01-05 DIAGNOSIS — Z7982 Long term (current) use of aspirin: Secondary | ICD-10-CM | POA: Insufficient documentation

## 2014-01-05 DIAGNOSIS — I69998 Other sequelae following unspecified cerebrovascular disease: Secondary | ICD-10-CM | POA: Insufficient documentation

## 2014-01-05 DIAGNOSIS — Z79899 Other long term (current) drug therapy: Secondary | ICD-10-CM | POA: Insufficient documentation

## 2014-01-05 DIAGNOSIS — D596 Hemoglobinuria due to hemolysis from other external causes: Secondary | ICD-10-CM

## 2014-01-05 DIAGNOSIS — I10 Essential (primary) hypertension: Secondary | ICD-10-CM

## 2014-01-05 DIAGNOSIS — F329 Major depressive disorder, single episode, unspecified: Secondary | ICD-10-CM | POA: Insufficient documentation

## 2014-01-05 DIAGNOSIS — I252 Old myocardial infarction: Secondary | ICD-10-CM | POA: Insufficient documentation

## 2014-01-05 LAB — CBC
HCT: 32.5 % — ABNORMAL LOW (ref 39.0–52.0)
Hemoglobin: 9.9 g/dL — ABNORMAL LOW (ref 13.0–17.0)
MCH: 22.7 pg — ABNORMAL LOW (ref 26.0–34.0)
MCHC: 30.5 g/dL (ref 30.0–36.0)
MCV: 74.5 fL — AB (ref 78.0–100.0)
Platelets: 326 10*3/uL (ref 150–400)
RBC: 4.36 MIL/uL (ref 4.22–5.81)
RDW: 20 % — AB (ref 11.5–15.5)
WBC: 3.6 10*3/uL — ABNORMAL LOW (ref 4.0–10.5)

## 2014-01-05 LAB — PRO B NATRIURETIC PEPTIDE: Pro B Natriuretic peptide (BNP): 1400 pg/mL — ABNORMAL HIGH (ref 0–125)

## 2014-01-05 LAB — BASIC METABOLIC PANEL
Anion gap: 18 — ABNORMAL HIGH (ref 5–15)
BUN: 9 mg/dL (ref 6–23)
CALCIUM: 8.7 mg/dL (ref 8.4–10.5)
CO2: 20 mEq/L (ref 19–32)
CREATININE: 0.94 mg/dL (ref 0.50–1.35)
Chloride: 99 mEq/L (ref 96–112)
Glucose, Bld: 88 mg/dL (ref 70–99)
Potassium: 4 mEq/L (ref 3.7–5.3)
Sodium: 137 mEq/L (ref 137–147)

## 2014-01-05 LAB — LACTATE DEHYDROGENASE: LDH: 956 U/L — ABNORMAL HIGH (ref 94–250)

## 2014-01-05 LAB — PROTIME-INR
INR: 1.58 — ABNORMAL HIGH (ref 0.00–1.49)
Prothrombin Time: 18.9 seconds — ABNORMAL HIGH (ref 11.6–15.2)

## 2014-01-05 NOTE — Patient Instructions (Addendum)
1.  Take coumadin 6 mg today and 6 mg tomorrow. Then take 4 mg daily. Continue Loveonx injections TWICE daily until wer re- check your INR next Monday 01/10/14. 2.  Lab visit here next Monday for INR check. 3.  Return to VAD clinic in one month.

## 2014-01-05 NOTE — Progress Notes (Signed)
Symptom  Yes  No  Details   Angina         x Activity:   Claudication         x How far:   Syncope         x When:   Stroke        x    Orthopnea         x How many pillows: one  PND         x How often:  CPAP      N/A How many hrs:   Pedal edema          x         left leg only  Abd fullness         x   N&V         x  good appetite; eats full meals  Diaphoresis         x When:  Bleeding        x   Urine color     yellow  SOB         x  Activity:  incline  Palpitations         x When:  ICD shock         x   Hospitlizaitons                x When/where/why:    ED visit         x When/where/why:  Other MD         x When/who/why:  Activity           Limited; playing video games  Fluid           No limitations "drinking 24 oz day"  Diet     No limitations   Vital signs: HR:  95 MAP BP:  86 O2 Sat: 97 Wt: 179.4  lbs   Last wt: 177.4  lbs Ht: 5'4"  LVAD interrogation reveals:  Speed:  9400 Flow:  3.8 Power:  5.0 PI:  3.8 Alarms: few low voltage advisories Events:  3 - 5 PI events Fixed speed:  9400 Low speed limit: 8800   LVAD exit site:  Well healed and incorporated. The velour is fully implanted at exit site. SorbaView dressing present, and applied correctly. Pt doing his own dressing changes.Stabilization device present.    I reviewed the LVAD parameters from today, and compared the results to the patient's prior recorded data. The LVAD is functioning within specified parameters.  LVAD interrogation was negative for any significant power changes; or significant low flow alarms. A few PI events/speed drops present which is consistent with the patient's history.    Pt/caregiver did not bring meds to clinic visit today as instructed. Tanisha accompanied patient and verified meds. Pt has not been taking Lovenox as instructed due to bleeding issues at injections sites. Has taken @ 4 doses since 12/30/13.    LVAD equipment check completed and is in good working order. Back-up  equipment present. LVAD education done on emergency procedures and precautions and reviewed exit site care.

## 2014-01-05 NOTE — Progress Notes (Signed)
Patient ID: [name], male   DOB: 01/14/1961, 53 y.o.   MRN: [id number]  Primary Physician: Dr. Oliver BarreJames [name]  Primary Cardiologist: Dr. 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   [facility] with NSTEMI and RV failure. He had a difficult course that was complicated by embolic right CVA. He was intubated and eventually had a tracheostomy placed 07/28/13. He was transferred to inpatient rehab for extensive rehab and was making progression, however despite aggressive therapy with heparin, coumadin, ASA and Plavix, his LDH remained persistently elevated in the 950 range (was about 1750 on admit). He did not experience any pump dysfunction, however the case was discussed with Dr Allena KatzPatel and Romona CurlsMilano at [facility] and it was felt to transfer him down there for possible pump exchange. He underwent a pump exchange 08/31/13. Post op was complicated by bleeding and he received multiple transfusions. He had AKI and had intermittent HD and underwent last HD on 09/24/13. Last Cr on chart 1.9 (10/02/13)  Admitted 5/13-5/18/15 for volume overload and possible hemolysis. LDH 647 and INR 1.69 on admission. Treated with IV lasix and IV heparin. While in the hospital developed some PI events and speed was decreased to 9400. ICD interrogated and showed A flutter and he was placed on IV amiodarone and then transitioned to 200 mg PO BID. He cardioverted to NSR on 5/15. LDH trended down and on discharge was 530 and INR 2.4.  Follow up for Heart Failure and LVAD: Last visit increased losartan to 50 mg BID. Overall feeling good. Denies SOB, orthopnea, PND or CP. Not exercising. Not sure what weight is at home. Had low INR and has not been taking lovenox as prescribed.    SH: Disabled, lives at home with caregiver. No ETOH or smoking FH: Mother living; HTN        Father  living; no health issues   No alarms.  Denies driveline trauma, erythema or drainage.  Denies ICD shocks.   Reports taking Coumadin as prescribed and adherence to anticoagulation based dietary restrictions.  Denies bright red blood per rectum or melena, no dark urine or hematuria.     Past Medical History  Diagnosis Date  . CHF (congestive heart failure)     EF- 10-15  . Medically noncompliant   . Mitral regurgitation   . Tobacco user   . HTN (hypertension)   . AICD (automatic cardioverter/defibrillator) present   . GERD (gastroesophageal reflux disease)   . Substance abuse   . Chronic renal insufficiency   . Syncope   . Thrombus 08/06/2010  . SYSTOLIC HEART FAILURE, CHRONIC 09/22/2008    Qualifier: Diagnosis of  By: [name], MD, Trixie DredgeFACC, Daniel R   . LV (left ventricular) mural thrombus 01/28/2011  . ICD - IN SITU 09/16/2008    Qualifier: Diagnosis of  By: Wonda AmisBednar, NP-C, Michelle    . MITRAL STENOSIS/ INSUFFICIENCY, NON-RHEUMATIC 09/22/2008    Qualifier: Diagnosis of  By: [name], MD, Trixie DredgeFACC, Daniel R   . Hepatomegaly 09/16/2008    Qualifier: Diagnosis of  By: Wonda AmisBednar, NP-C, Michelle    . High cholesterol 02/26/2012    "at one time"  . Sleep apnea   . Exertional dyspnea 02/26/2012  . History of blood transfusion 08/2011    "when I had heart pump"  . Migraines   . COMMON  MIGRAINE 06/14/2009    Qualifier: Diagnosis of  By: Jonny Ruiz MD, Len Blalock   . History of gout 02/26/2012  . Depression 08/11/2013    Pt denies  . Bipolar affective disorder 10/22/2011    pt denies this hx 02/26/2012    Current Outpatient Prescriptions  Medication Sig Dispense Refill  . furosemide (LASIX) 40 MG tablet Take 1 tablet (40 mg total) by mouth daily.  30 tablet  3  . amiodarone (PACERONE) 200 MG tablet Take 1 tablet (200 mg total) by mouth 2 (two) times daily.  60 tablet  6  . aspirin EC 325 MG EC tablet Take 1 tablet (325 mg total) by mouth daily.  30 tablet  6  . carvedilol (COREG) 3.125 MG tablet Take 1 tablet  (3.125 mg total) by mouth 2 (two) times daily with a meal.  60 tablet  6  . clopidogrel (PLAVIX) 75 MG tablet Take 1 tablet (75 mg total) by mouth daily with breakfast.  30 tablet  6  . enoxaparin (LOVENOX) 80 MG/0.8ML injection Inject 0.8 mLs (80 mg total) into the skin every 12 (twelve) hours.  10 Syringe  2  . hydrALAZINE (APRESOLINE) 100 MG tablet Take 50 mg by mouth 3 (three) times daily.      . isosorbide mononitrate (IMDUR) 60 MG 24 hr tablet Take 1 tablet (60 mg total) by mouth daily.  60 tablet  6  . losartan (COZAAR) 50 MG tablet Take 50 mg by mouth 2 (two) times daily.      . mirtazapine (REMERON) 30 MG tablet Take 30 mg by mouth at bedtime.      . sertraline (ZOLOFT) 100 MG tablet Take 100 mg by mouth daily.      . tamsulosin (FLOMAX) 0.4 MG CAPS capsule Take 1 capsule (0.4 mg total) by mouth daily.  30 capsule    . warfarin (COUMADIN) 3 MG tablet Take 1 tablet (3 mg total) by mouth daily.  60 tablet  6   No current facility-administered medications for this encounter.   Facility-Administered Medications Ordered in Other Encounters  Medication Dose Route Frequency Provider Last Rate Last Dose  . sodium chloride 0.9 % injection 10-40 mL  10-40 mL Intracatheter PRN [name], MD        Ace inhibitors and Lexapro  REVIEW OF SYSTEMS: All systems negative except as listed in HPI, PMH and Problem list.   LVAD INTERROGATION:   HeartMate II LVAD: Flow 3.6 liters/min Speed 9400 Power 5.0 PI 3.6 PI events: 3-5 a day.  Alarms: No alarms Low speed: 8800 Set speed 9400  I reviewed the LVAD parameters from today, and compared the results to the patient's prior recorded data. No changes to LVAD speed. The LVAD is functioning within specified parameters.  The patient performs LVAD self-test daily.  LVAD interrogation was negative for any significant power changes, alarms or PI events/speed drops.  LVAD equipment check completed and is in good working order.  Back-up equipment  present.   LVAD education done on emergency procedures and precautions and reviewed exit site care.    Filed Vitals:   01/05/14 1322  BP: 84/0  Pulse: 95  Height: 5\' 4"  (1.626 m)  Weight: 179 lb 6.4 oz (81.375 kg)  SpO2: 97%    Physical Exam: GENERAL: Fatigued appearing, male who presents to clinic today HEENT: normal  NECK: Supple, JVP flat, .2+ bilaterally, no bruits.  No lymphadenopathy or thyromegaly appreciated.   CARDIAC:  Mechanical heart sounds with LVAD  hum present.  LUNGS:  Clear to auscultation bilaterally.  ABDOMEN:  Soft, round, nontender, positive bowel sounds x4.     LVAD exit site: well-healed and incorporated.  No dressing.  No erythema or drainage.  Stabilization device present and accurately applied. Dressing being changed weekly. EXTREMITIES:  Warm and dry, no cyanosis, clubbing, rash, trace edema NEUROLOGIC:  Alert and oriented x 4.  Gait steady.  No aphasia.  No dysarthria.  Affect pleasant.     ASSESSMENT AND PLAN:   1) Chronic systolic HF: NICM, s/p LVAD implant (08/2011) and subsequent (08/2013) - NYHA II symptoms and volume status stable. Will continue lasix 40 mg daily. Instructed to call if his weight starts trending up or down.  - MAP stable.  - On low dose coreg 3.125 mg BID. Will not titrate with RV failure. - Continue hydralazine 50 gm TID, Imdur 60 mg daily and losartan 50 mg BID. -  Reinforced the need and importance of daily weights, a low sodium diet, and fluid restriction (less than 2 L a day). Instructed to call the HF clinic if weight increases more than 3 lbs overnight or 5 lbs in a week.  2) LVAD, s/p (08/2011) and then (08/2013) d/t pump thrombosis - LVAD parameters stable.  - Check CBC, BMET, INR, LDH and pro-BNP 3) HTN - stable.  4) Hemolysis - Check LDH. Will continue plavix, coumadin and ASA. No s/s of bleeding 5) CVA - Stable. Still sided deficits and now is having some strength issues in R arm. Have encouraged to continue to walk and  be more active. Will check to see if we can get any PT/OT other than HH since patient does not like people coming to his house. - Will need to start statin next visit. Continue ASA and Plavix.  6) Atrial Flutter: - Will continue amio 200 mg BID and can consider decreasing next visit after getting EKG.  7) Anticoagulation management - Check INR. Goal 2.0-3.0. Will adjust coumadin accordingly.  8) RV failure - Fluid status much improved.   F/U 1 month [name] B NP-C 1:37 PM   Addendum:  Labs resulted and INR subtherapeutic and LDH up from 628 to 956. He had no signs of HF and knows to call if any HF symptoms, dark colored urine or power spikes. Will start lovenox 80 mg SQ q 12 hrs and increase coumadin dose. Repeat on Monday INR and in two weeks INR and LDH.

## 2014-01-07 ENCOUNTER — Other Ambulatory Visit (HOSPITAL_COMMUNITY): Payer: Self-pay | Admitting: *Deleted

## 2014-01-07 ENCOUNTER — Telehealth (HOSPITAL_COMMUNITY): Payer: Self-pay | Admitting: *Deleted

## 2014-01-07 DIAGNOSIS — I639 Cerebral infarction, unspecified: Secondary | ICD-10-CM

## 2014-01-07 NOTE — Telephone Encounter (Signed)
Called [name] and informed her referral for OP physical, occupational, and speech therapy has been ordered. Contact info [phone] for [facility] given to her. Asked her to f/u on scheduling his OP appts. She agreed to same.

## 2014-01-10 ENCOUNTER — Encounter (HOSPITAL_COMMUNITY): Payer: Self-pay | Admitting: *Deleted

## 2014-01-10 ENCOUNTER — Ambulatory Visit (HOSPITAL_COMMUNITY)
Admission: RE | Admit: 2014-01-10 | Discharge: 2014-01-10 | Disposition: A | Discharge status: Home / Self Care | Payer: Medicare Other | Source: Ambulatory Visit | Attending: Cardiology | Admitting: Cardiology

## 2014-01-10 ENCOUNTER — Ambulatory Visit (HOSPITAL_COMMUNITY): Payer: Self-pay | Admitting: *Deleted

## 2014-01-10 DIAGNOSIS — I509 Heart failure, unspecified: Secondary | ICD-10-CM

## 2014-01-10 DIAGNOSIS — Z7901 Long term (current) use of anticoagulants: Secondary | ICD-10-CM

## 2014-01-10 DIAGNOSIS — Z95811 Presence of heart assist device: Secondary | ICD-10-CM

## 2014-01-10 LAB — PROTIME-INR
INR: 2.48 — AB (ref 0.00–1.49)
Prothrombin Time: 26.8 seconds — ABNORMAL HIGH (ref 11.6–15.2)

## 2014-01-10 LAB — LACTATE DEHYDROGENASE: LDH: 736 U/L — AB (ref 94–250)

## 2014-01-10 NOTE — Progress Notes (Signed)
  Pt presented to VAD clinic for lab draw this morning and reported he had yellow wrench alarm this morning advising him to "change controller".  History reveals at 10:12 am he did experience "replace controller" advisory.  He exchanged the controller at 10:14 am with the help of Gala Murdoch (primary caregiver) without difficulty.  They did not remember how to place the controller in "sleep mode" and brought to clinic alarming.  He reported no prior alarms, denied any adverse symptoms.  Reminded patient to call VAD pager or 911 prior to attempting to change controller in future. Both verbalized understanding of same.  His INR last week was sub therapeutic with elevated LDH, but has subsequently been on Lovenox.  History today revealed no elevated powers. His INR is therapeutic today at 2.48 with improving LDH 736. Pt denies any return of heart failure symptoms or tea colored urine. Denies changes in VAD parameters or VAD alarms until yellow wrench alarm this morning.   Controller that was replaced:  [id number] (v7.23). Event downloaded and sent to [employer] for analysis.  Both primary and backup controllers programmed at fixed speed 9400 RPM and low speed limit 8800 RPM:  Primary controller:  [id number]  Secondary controller:  [phone]

## 2014-01-10 NOTE — Addendum Note (Signed)
Encounter addended by: Ave Filter, RN on: 01/10/2014 11:59 AM<BR>     Documentation filed: Orders

## 2014-01-13 ENCOUNTER — Ambulatory Visit: Payer: Medicare Other

## 2014-01-13 ENCOUNTER — Ambulatory Visit: Payer: Medicare Other | Admitting: Physical Therapy

## 2014-01-13 ENCOUNTER — Ambulatory Visit: Payer: Medicare Other | Attending: Internal Medicine | Admitting: Occupational Therapy

## 2014-01-13 DIAGNOSIS — R1319 Other dysphagia: Secondary | ICD-10-CM | POA: Insufficient documentation

## 2014-01-13 DIAGNOSIS — M6281 Muscle weakness (generalized): Secondary | ICD-10-CM | POA: Diagnosis not present

## 2014-01-13 DIAGNOSIS — M629 Disorder of muscle, unspecified: Secondary | ICD-10-CM | POA: Diagnosis not present

## 2014-01-13 DIAGNOSIS — Z8673 Personal history of transient ischemic attack (TIA), and cerebral infarction without residual deficits: Secondary | ICD-10-CM | POA: Insufficient documentation

## 2014-01-13 DIAGNOSIS — M2420 Disorder of ligament, unspecified site: Secondary | ICD-10-CM | POA: Insufficient documentation

## 2014-01-13 DIAGNOSIS — R279 Unspecified lack of coordination: Secondary | ICD-10-CM | POA: Diagnosis not present

## 2014-01-13 DIAGNOSIS — Z5189 Encounter for other specified aftercare: Secondary | ICD-10-CM | POA: Insufficient documentation

## 2014-01-13 DIAGNOSIS — I509 Heart failure, unspecified: Secondary | ICD-10-CM | POA: Insufficient documentation

## 2014-01-17 ENCOUNTER — Ambulatory Visit (HOSPITAL_COMMUNITY)
Admission: RE | Admit: 2014-01-17 | Discharge: 2014-01-17 | Disposition: A | Discharge status: Home / Self Care | Payer: Medicare Other | Source: Ambulatory Visit | Attending: Internal Medicine | Admitting: Internal Medicine

## 2014-01-17 ENCOUNTER — Ambulatory Visit (HOSPITAL_COMMUNITY): Payer: Self-pay | Admitting: *Deleted

## 2014-01-17 DIAGNOSIS — Z7901 Long term (current) use of anticoagulants: Secondary | ICD-10-CM | POA: Insufficient documentation

## 2014-01-17 DIAGNOSIS — Z95811 Presence of heart assist device: Secondary | ICD-10-CM

## 2014-01-17 DIAGNOSIS — I5022 Chronic systolic (congestive) heart failure: Secondary | ICD-10-CM

## 2014-01-17 LAB — LACTATE DEHYDROGENASE: LDH: 668 U/L — ABNORMAL HIGH (ref 94–250)

## 2014-01-17 LAB — PROTIME-INR
INR: 1.9 — AB (ref 0.00–1.49)
Prothrombin Time: 21.8 seconds — ABNORMAL HIGH (ref 11.6–15.2)

## 2014-01-20 ENCOUNTER — Ambulatory Visit: Payer: Medicare Other | Admitting: Occupational Therapy

## 2014-01-20 ENCOUNTER — Ambulatory Visit: Payer: Medicare Other

## 2014-01-20 DIAGNOSIS — Z5189 Encounter for other specified aftercare: Secondary | ICD-10-CM | POA: Diagnosis not present

## 2014-01-24 ENCOUNTER — Ambulatory Visit (HOSPITAL_COMMUNITY): Payer: Self-pay | Admitting: *Deleted

## 2014-01-24 ENCOUNTER — Other Ambulatory Visit (HOSPITAL_COMMUNITY): Payer: Self-pay | Admitting: *Deleted

## 2014-01-24 ENCOUNTER — Ambulatory Visit (HOSPITAL_COMMUNITY)
Admission: RE | Admit: 2014-01-24 | Discharge: 2014-01-24 | Disposition: A | Discharge status: Home / Self Care | Payer: Medicare Other | Source: Ambulatory Visit | Attending: Internal Medicine | Admitting: Internal Medicine

## 2014-01-24 DIAGNOSIS — Z7901 Long term (current) use of anticoagulants: Secondary | ICD-10-CM | POA: Diagnosis present

## 2014-01-24 DIAGNOSIS — I5022 Chronic systolic (congestive) heart failure: Secondary | ICD-10-CM

## 2014-01-24 DIAGNOSIS — Z95811 Presence of heart assist device: Secondary | ICD-10-CM

## 2014-01-24 LAB — PROTIME-INR
INR: 2.93 — ABNORMAL HIGH (ref 0.00–1.49)
PROTHROMBIN TIME: 30.6 s — AB (ref 11.6–15.2)

## 2014-01-24 LAB — LACTATE DEHYDROGENASE: LDH: 552 U/L — ABNORMAL HIGH (ref 94–250)

## 2014-01-27 ENCOUNTER — Ambulatory Visit: Payer: Medicare Other | Admitting: Physical Therapy

## 2014-01-27 ENCOUNTER — Ambulatory Visit: Payer: Medicare Other | Attending: Internal Medicine | Admitting: Occupational Therapy

## 2014-01-27 DIAGNOSIS — R279 Unspecified lack of coordination: Secondary | ICD-10-CM | POA: Diagnosis not present

## 2014-01-27 DIAGNOSIS — Z8673 Personal history of transient ischemic attack (TIA), and cerebral infarction without residual deficits: Secondary | ICD-10-CM | POA: Diagnosis not present

## 2014-01-27 DIAGNOSIS — Z5189 Encounter for other specified aftercare: Secondary | ICD-10-CM | POA: Diagnosis not present

## 2014-01-27 DIAGNOSIS — M629 Disorder of muscle, unspecified: Secondary | ICD-10-CM | POA: Diagnosis not present

## 2014-01-27 DIAGNOSIS — R1319 Other dysphagia: Secondary | ICD-10-CM | POA: Insufficient documentation

## 2014-01-27 DIAGNOSIS — I509 Heart failure, unspecified: Secondary | ICD-10-CM | POA: Insufficient documentation

## 2014-01-27 DIAGNOSIS — M6281 Muscle weakness (generalized): Secondary | ICD-10-CM | POA: Diagnosis not present

## 2014-01-27 DIAGNOSIS — M2420 Disorder of ligament, unspecified site: Secondary | ICD-10-CM | POA: Insufficient documentation

## 2014-01-31 ENCOUNTER — Other Ambulatory Visit (HOSPITAL_COMMUNITY): Payer: Self-pay | Admitting: Internal Medicine

## 2014-01-31 ENCOUNTER — Ambulatory Visit: Payer: Medicare Other | Admitting: Occupational Therapy

## 2014-01-31 ENCOUNTER — Ambulatory Visit: Payer: Medicare Other | Admitting: Physical Therapy

## 2014-01-31 DIAGNOSIS — R1310 Dysphagia, unspecified: Secondary | ICD-10-CM

## 2014-01-31 DIAGNOSIS — Z5189 Encounter for other specified aftercare: Secondary | ICD-10-CM | POA: Diagnosis not present

## 2014-02-02 ENCOUNTER — Inpatient Hospital Stay (HOSPITAL_COMMUNITY): Admission: RE | Admit: 2014-02-02 | Payer: Medicare Other | Source: Ambulatory Visit

## 2014-02-02 ENCOUNTER — Other Ambulatory Visit (HOSPITAL_COMMUNITY): Payer: Self-pay | Admitting: *Deleted

## 2014-02-02 ENCOUNTER — Encounter: Payer: Medicare Other | Admitting: Occupational Therapy

## 2014-02-02 ENCOUNTER — Ambulatory Visit: Payer: Medicare Other | Admitting: Physical Therapy

## 2014-02-02 ENCOUNTER — Ambulatory Visit (HOSPITAL_COMMUNITY)
Admission: RE | Admit: 2014-02-02 | Payer: Medicare Other | Source: Ambulatory Visit | Attending: Internal Medicine | Admitting: Internal Medicine

## 2014-02-02 DIAGNOSIS — I10 Essential (primary) hypertension: Secondary | ICD-10-CM

## 2014-02-02 DIAGNOSIS — I513 Intracardiac thrombosis, not elsewhere classified: Secondary | ICD-10-CM

## 2014-02-02 DIAGNOSIS — I509 Heart failure, unspecified: Secondary | ICD-10-CM

## 2014-02-02 DIAGNOSIS — I6312 Cerebral infarction due to embolism of basilar artery: Secondary | ICD-10-CM

## 2014-02-02 DIAGNOSIS — Z95811 Presence of heart assist device: Secondary | ICD-10-CM

## 2014-02-02 MED ORDER — ISOSORBIDE MONONITRATE ER 60 MG PO TB24: 60.0000 mg | ORAL_TABLET | Freq: Every day | ORAL | Status: DC

## 2014-02-02 MED ORDER — HYDRALAZINE HCL 100 MG PO TABS: 50.0000 mg | ORAL_TABLET | Freq: Three times a day (TID) | ORAL | Status: DC

## 2014-02-02 MED ORDER — LOSARTAN POTASSIUM 50 MG PO TABS: 50.0000 mg | ORAL_TABLET | Freq: Two times a day (BID) | ORAL | Status: DC

## 2014-02-02 MED ORDER — FUROSEMIDE 40 MG PO TABS: 40.0000 mg | ORAL_TABLET | Freq: Every day | ORAL | Status: DC

## 2014-02-02 MED ORDER — CARVEDILOL 3.125 MG PO TABS: 3.1250 mg | ORAL_TABLET | Freq: Two times a day (BID) | ORAL | Status: DC

## 2014-02-02 MED ORDER — AMIODARONE HCL 200 MG PO TABS: 200.0000 mg | ORAL_TABLET | Freq: Two times a day (BID) | ORAL | Status: DC

## 2014-02-02 MED ORDER — CLOPIDOGREL BISULFATE 75 MG PO TABS: 75.0000 mg | ORAL_TABLET | Freq: Every day | ORAL | Status: DC

## 2014-02-03 ENCOUNTER — Encounter (HOSPITAL_COMMUNITY): Payer: Self-pay

## 2014-02-03 ENCOUNTER — Other Ambulatory Visit (HOSPITAL_COMMUNITY): Payer: Self-pay | Admitting: *Deleted

## 2014-02-03 ENCOUNTER — Ambulatory Visit (HOSPITAL_COMMUNITY)
Admission: RE | Admit: 2014-02-03 | Discharge: 2014-02-03 | Disposition: A | Discharge status: Home / Self Care | Payer: Medicare Other | Source: Ambulatory Visit | Attending: Internal Medicine | Admitting: Internal Medicine

## 2014-02-03 ENCOUNTER — Ambulatory Visit (HOSPITAL_COMMUNITY): Payer: Self-pay | Admitting: *Deleted

## 2014-02-03 ENCOUNTER — Telehealth (HOSPITAL_COMMUNITY): Payer: Self-pay | Admitting: *Deleted

## 2014-02-03 VITALS — BP 98/0 | HR 103 | Ht 64.0 in | Wt 183.0 lb

## 2014-02-03 DIAGNOSIS — I10 Essential (primary) hypertension: Secondary | ICD-10-CM | POA: Insufficient documentation

## 2014-02-03 DIAGNOSIS — R5383 Other fatigue: Secondary | ICD-10-CM

## 2014-02-03 DIAGNOSIS — Z95811 Presence of heart assist device: Secondary | ICD-10-CM | POA: Diagnosis present

## 2014-02-03 DIAGNOSIS — I69998 Other sequelae following unspecified cerebrovascular disease: Secondary | ICD-10-CM | POA: Diagnosis not present

## 2014-02-03 DIAGNOSIS — I4892 Unspecified atrial flutter: Secondary | ICD-10-CM | POA: Insufficient documentation

## 2014-02-03 DIAGNOSIS — Z79899 Other long term (current) drug therapy: Secondary | ICD-10-CM

## 2014-02-03 DIAGNOSIS — F17200 Nicotine dependence, unspecified, uncomplicated: Secondary | ICD-10-CM | POA: Insufficient documentation

## 2014-02-03 DIAGNOSIS — Z7901 Long term (current) use of anticoagulants: Secondary | ICD-10-CM | POA: Insufficient documentation

## 2014-02-03 DIAGNOSIS — I639 Cerebral infarction, unspecified: Secondary | ICD-10-CM

## 2014-02-03 DIAGNOSIS — K219 Gastro-esophageal reflux disease without esophagitis: Secondary | ICD-10-CM | POA: Diagnosis not present

## 2014-02-03 DIAGNOSIS — R5381 Other malaise: Secondary | ICD-10-CM | POA: Diagnosis not present

## 2014-02-03 DIAGNOSIS — I509 Heart failure, unspecified: Secondary | ICD-10-CM | POA: Diagnosis not present

## 2014-02-03 DIAGNOSIS — I5022 Chronic systolic (congestive) heart failure: Secondary | ICD-10-CM | POA: Diagnosis present

## 2014-02-03 DIAGNOSIS — I159 Secondary hypertension, unspecified: Secondary | ICD-10-CM

## 2014-02-03 DIAGNOSIS — I483 Typical atrial flutter: Secondary | ICD-10-CM

## 2014-02-03 DIAGNOSIS — Z7982 Long term (current) use of aspirin: Secondary | ICD-10-CM | POA: Diagnosis not present

## 2014-02-03 DIAGNOSIS — I6350 Cerebral infarction due to unspecified occlusion or stenosis of unspecified cerebral artery: Secondary | ICD-10-CM

## 2014-02-03 DIAGNOSIS — I158 Other secondary hypertension: Secondary | ICD-10-CM

## 2014-02-03 LAB — LACTATE DEHYDROGENASE: LDH: 533 U/L — ABNORMAL HIGH (ref 94–250)

## 2014-02-03 LAB — PROTIME-INR
INR: 2.38 — AB (ref 0.00–1.49)
Prothrombin Time: 26 seconds — ABNORMAL HIGH (ref 11.6–15.2)

## 2014-02-03 LAB — BASIC METABOLIC PANEL
Anion gap: 17 — ABNORMAL HIGH (ref 5–15)
BUN: 10 mg/dL (ref 6–23)
CALCIUM: 9 mg/dL (ref 8.4–10.5)
CHLORIDE: 98 meq/L (ref 96–112)
CO2: 24 mEq/L (ref 19–32)
CREATININE: 1.02 mg/dL (ref 0.50–1.35)
GFR calc non Af Amer: 84 mL/min — ABNORMAL LOW (ref >90)
Glucose, Bld: 137 mg/dL — ABNORMAL HIGH (ref 70–99)
Potassium: 2.9 mEq/L — CL (ref 3.7–5.3)
Sodium: 139 mEq/L (ref 137–147)

## 2014-02-03 LAB — PRO B NATRIURETIC PEPTIDE: Pro B Natriuretic peptide (BNP): 1553 pg/mL — ABNORMAL HIGH (ref 0–125)

## 2014-02-03 LAB — CBC
HEMATOCRIT: 34.2 % — AB (ref 39.0–52.0)
Hemoglobin: 10.6 g/dL — ABNORMAL LOW (ref 13.0–17.0)
MCH: 22.8 pg — ABNORMAL LOW (ref 26.0–34.0)
MCHC: 31 g/dL (ref 30.0–36.0)
MCV: 73.5 fL — AB (ref 78.0–100.0)
Platelets: 368 10*3/uL (ref 150–400)
RBC: 4.65 MIL/uL (ref 4.22–5.81)
RDW: 19.6 % — ABNORMAL HIGH (ref 11.5–15.5)
WBC: 3.7 10*3/uL — ABNORMAL LOW (ref 4.0–10.5)

## 2014-02-03 MED ORDER — WARFARIN SODIUM 4 MG PO TABS: ORAL_TABLET | ORAL | Status: DC

## 2014-02-03 MED ORDER — HYDRALAZINE HCL 50 MG PO TABS: 50.0000 mg | ORAL_TABLET | Freq: Three times a day (TID) | ORAL | Status: DC

## 2014-02-03 MED ORDER — TAMSULOSIN HCL 0.4 MG PO CAPS: 0.4000 mg | ORAL_CAPSULE | Freq: Every day | ORAL | Status: DC

## 2014-02-03 MED ORDER — FUROSEMIDE 40 MG PO TABS: ORAL_TABLET | ORAL | Status: DC

## 2014-02-03 MED ORDER — POTASSIUM CHLORIDE CRYS ER 20 MEQ PO TBCR: EXTENDED_RELEASE_TABLET | ORAL | Status: DC

## 2014-02-03 MED ORDER — AMIODARONE HCL 200 MG PO TABS: 200.0000 mg | ORAL_TABLET | Freq: Every day | ORAL | Status: DC

## 2014-02-03 NOTE — Progress Notes (Addendum)
Symptom  Yes  No  Details   Angina         x Activity:   Claudication         x How far:   Syncope         x When:   Stroke        x    Orthopnea         x How many pillows: one  PND         x How often:  CPAP      N/A How many hrs:   Pedal edema          x         left leg only  Abd fullness         x   N&V         x  good appetite; eats full meals  Diaphoresis         x When:  Bleeding        x   Urine color     yellow  SOB         x  Activity:  incline  Palpitations         x When:  ICD shock         x   Hospitlizaitons                x When/where/why:    ED visit         x When/where/why:  Other MD         x When/who/why:  Activity          [facility]; missed appt yesterday.  Fluid           No limitations < 2 liter/daily  Diet     No limitations   Vital signs: HR:  103 MAP BP:  98 O2 Sat: 98 Wt: 183.6  lbs   Last wt: 177.4  lbs Ht: 5'4"  LVAD interrogation reveals:  Speed:  9400 Flow:  3.6 Power:  5.3 PI:  6.6 Alarms: 01/20/14 - brief replace backup battery (pt denies hearing alarm) Events:  4 - 6  PI events Fixed speed:  9400 Low speed limit: 8800    LVAD exit site:  Well healed and incorporated. The velour is fully implanted at exit site. SorbaView dressing present, and applied correctly. Pt doing his own dressing changes.Stabilization device present.    I reviewed the LVAD parameters from today, and compared the results to the patient's prior recorded data. The LVAD is functioning within specified parameters.  LVAD interrogation was negative for any significant power changes; or low flow alarms. A few PI events/speed drops present which is consistent with the patient's history.    Pt did not bring meds to clinic visit today as instructed; states his Medicaid lapsed and he is running out of meds. Claims he took Lasix 40 mg and K-Dur 40 meq twice this week ("to make it last"); thinks he has been taking his coumadin and  other meds as ordered.    LVAD equipment check completed and is in good working order. Back-up equipment present. LVAD education done on emergency procedures and precautions and reviewed exit site care.   Pt's gain unsteady today; offered pt walker. Pt refused, stating "I have one at home"; he is asking for a cane.  [facility] and spoke with Olegario MessierKathy (physical therapist). They have  recommended an AFO and knee extension brace for unsteady gain (found using walker increased fall risk), but patient missed appt yesterday and wore improper shoes at last visit. Will encourage patient to f/u with plan of care at [facility].

## 2014-02-03 NOTE — Progress Notes (Addendum)
Patient ID: [name], male   DOB: 05-04-1961, 53 y.o.   MRN: [id number]  Primary Physician: Dr. Oliver Barre  Primary Cardiologist: Dr. 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   He was admitted to [facility] January 2015 with NSTEMI and RV failure. He had a difficult course that was complicated by embolic right CVA. He was intubated and eventually had a tracheostomy placed 07/28/13. He was transferred to inpatient rehab for extensive rehab and was making progression, however despite aggressive therapy with heparin, coumadin, ASA and Plavix, his LDH remained persistently elevated in the 950 range (was about 1750 on admit). He did not experience any pump dysfunction, however the case was discussed with Dr Allena Katz and [name] at [facility] and it was felt to transfer him down there for possible pump exchange. He underwent a pump exchange 08/31/13. Post op was complicated by bleeding and he received multiple transfusions. He had AKI and had intermittent HD and underwent last HD on 09/24/13. Last Cr on chart 1.9 (10/02/13)  Admitted 5/13-5/18/15 for volume overload and possible hemolysis. LDH 647 and INR 1.69 on admission. Treated with IV lasix and IV heparin. While in the hospital developed some PI events and speed was decreased to 9400. ICD interrogated and showed A flutter and he was placed on IV amiodarone and then transitioned to 200 mg PO BID. He cardioverted to NSR on 5/15. LDH trended down and on discharge was 530 and INR 2.4.  Follow up for Heart Failure and LVAD: Since last visit he showed up at clinic after changing his controller out d/t controller saying that it needed to be changed, controller checked and no alarms. Last visit INR sub therapeutic and was started on lovenox. Enrolled in OP PT/OT and he is going about twice a week. Having a lot of difficulty with  finances, has lost Medicaid. Ran out of medications because he was taking lasix extra. Denies SOB, orthopnea, PND or CP. Reports getting weaker on L side. Weight at home has been 180 lbs. Taking medications as prescribed. Able to walk from the clinic to parking garage without stopping.   SH: Disabled, lives at home with caregiver. No ETOH or smoking FH: Mother living; HTN        Father living; no health issues   No alarms.  Denies driveline trauma, erythema or drainage.  Denies ICD shocks.   Reports taking Coumadin as prescribed and adherence to anticoagulation based dietary restrictions.  Denies bright red blood per rectum or melena, no dark urine or hematuria.     Past Medical History  Diagnosis Date  . CHF (congestive heart failure)     EF- 10-15  . Medically noncompliant   . Mitral regurgitation   . Tobacco user   . HTN (hypertension)   . AICD (automatic cardioverter/defibrillator) present   . GERD (gastroesophageal reflux disease)   . Substance abuse   . Chronic renal insufficiency   . Syncope   . Thrombus 08/06/2010  . SYSTOLIC HEART FAILURE, CHRONIC 09/22/2008    Qualifier: Diagnosis of  By: Gala Romney, MD, Trixie Dredge   . LV (left ventricular) mural thrombus 01/28/2011  . ICD - IN SITU 09/16/2008    Qualifier: Diagnosis of  By: Wonda Amis    . MITRAL STENOSIS/ INSUFFICIENCY, NON-RHEUMATIC 09/22/2008    Qualifier: Diagnosis of  By: Bensimhon,  MD, Trixie DredgeFACC, Daniel R   . Hepatomegaly 09/16/2008    Qualifier: Diagnosis of  By: Wonda AmisBednar, NP-C, Michelle    . High cholesterol 02/26/2012    "at one time"  . Sleep apnea   . Exertional dyspnea 02/26/2012  . History of blood transfusion 08/2011    "when I had heart pump"  . Migraines   . COMMON MIGRAINE 06/14/2009    Qualifier: Diagnosis of  By: Jonny RuizJohn MD, Len BlalockJames W   . History of gout 02/26/2012  . Depression 08/11/2013    Pt denies  . Bipolar affective disorder 10/22/2011    pt denies this hx 02/26/2012    Current Outpatient  Prescriptions  Medication Sig Dispense Refill  . amiodarone (PACERONE) 200 MG tablet Take 1 tablet (200 mg total) by mouth 2 (two) times daily.  60 tablet  6  . aspirin EC 325 MG EC tablet Take 1 tablet (325 mg total) by mouth daily.  30 tablet  6  . carvedilol (COREG) 3.125 MG tablet Take 1 tablet (3.125 mg total) by mouth 2 (two) times daily with a meal.  60 tablet  6  . clopidogrel (PLAVIX) 75 MG tablet Take 1 tablet (75 mg total) by mouth daily with breakfast.  30 tablet  6  . enoxaparin (LOVENOX) 80 MG/0.8ML injection Inject 0.8 mLs (80 mg total) into the skin every 12 (twelve) hours.  10 Syringe  2  . furosemide (LASIX) 40 MG tablet Take 1 tablet (40 mg total) by mouth daily.  30 tablet  6  . hydrALAZINE (APRESOLINE) 100 MG tablet Take 0.5 tablets (50 mg total) by mouth 3 (three) times daily.  45 tablet  6  . isosorbide mononitrate (IMDUR) 60 MG 24 hr tablet Take 1 tablet (60 mg total) by mouth daily.  30 tablet  6  . losartan (COZAAR) 50 MG tablet Take 1 tablet (50 mg total) by mouth 2 (two) times daily.  60 tablet  6  . mirtazapine (REMERON) 30 MG tablet Take 30 mg by mouth at bedtime.      . sertraline (ZOLOFT) 100 MG tablet Take 100 mg by mouth daily.      . tamsulosin (FLOMAX) 0.4 MG CAPS capsule Take 1 capsule (0.4 mg total) by mouth daily.  30 capsule    . warfarin (COUMADIN) 3 MG tablet Take 3 mg by mouth daily. For INR 2.0 - 3.0       No current facility-administered medications for this encounter.   Facility-Administered Medications Ordered in Other Encounters  Medication Dose Route Frequency Provider Last Rate Last Dose  . sodium chloride 0.9 % injection 10-40 mL  10-40 mL Intracatheter PRN Ranelle OysterZachary T Swartz, MD        Ace inhibitors and Lexapro  REVIEW OF SYSTEMS: All systems negative except as listed in HPI, PMH and Problem list.   LVAD INTERROGATION:   HeartMate II LVAD: Flow 4.4 liters/min Speed 9400 Power 5.1 PI 5.8 PI events: 3-5 a day.  Alarms: a few low  voltage advisories Low speed: 8800 Set speed 9400  I reviewed the LVAD parameters from today, and compared the results to the patient's prior recorded data. No changes to LVAD speed. The LVAD is functioning within specified parameters.  The patient performs LVAD self-test daily.  LVAD interrogation was negative for any significant power changes, alarms or PI events/speed drops.  LVAD equipment check completed and is in good working order.  Back-up equipment present.   LVAD education done on emergency procedures and precautions  and reviewed exit site care.    Filed Vitals:   02/03/14 1217  BP: 98/0  Pulse: 103  Height: 5\' 4"  (1.626 m)  Weight: 183 lb (83.008 kg)  SpO2: 98%    Physical Exam: GENERAL: Well appearing, male who presents to clinic today HEENT: normal  NECK: Supple, JVP 8, .2+ bilaterally, no bruits.  No lymphadenopathy or thyromegaly appreciated.   CARDIAC:  Mechanical heart sounds with LVAD hum present, prominent heart sounds LUNGS:  Clear to auscultation bilaterally.  ABDOMEN:  Soft, round, nontender, positive bowel sounds x4.     LVAD exit site: well-healed and incorporated.  No dressing.  No erythema or drainage.  Stabilization device present and accurately applied. Dressing being changed weekly. EXTREMITIES:  Warm and dry, no cyanosis, clubbing, rash, 2+ edema L>R NEUROLOGIC:  Alert and oriented x 4.  Gait steady.  No aphasia.  No dysarthria.  Affect pleasant.     EKG: NSR 99 bpm, RBBB  ASSESSMENT AND PLAN:   1) Chronic systolic HF: NICM, s/p LVAD implant (08/2011) and subsequent (08/2013) - NYHA II-III symptoms and volume status slightly elevated. Will continue lasix 40 mg daily and told the patient to take an extra 40 mg of lasix if weight goes up more than 3 lbs in a day or 5 lbs in a week. - MAP slightly elevated will not change any medications currently, continue to follow and may need to increase hydralazine next visit.  - On low dose coreg 3.125 mg BID. Will not  titrate with RV failure. - Continue hydralazine 50 gm TID, Imdur 60 mg daily and losartan 50 mg BID. -  Reinforced the need and importance of daily weights, a low sodium diet, and fluid restriction (less than 2 L a day). Instructed to call the HF clinic if weight increases more than 3 lbs overnight or 5 lbs in a week.  2) LVAD, s/p (08/2011) and then (08/2013) d/t pump thrombosis - LVAD parameters stable, minimal PI events and multiple low voltage advisories. One alarm saying replace back up battery.  - Check CBC, BMET, INR, LDH and pro-BNP 3) HTN - slightly elevated. As above will not change any medications currently and continue to follow.  4) Hemolysis - Check LDH. Will continue plavix, coumadin and ASA. No s/s of bleeding. 5) CVA - Stable. Having more issues with L sided weakness. He is now enrolled in OP PT/OT and told him to not miss appointments. Need to start statin but will assess next visit once he has Medicaid back hopefully to help pay for medications. 6) Atrial Flutter: - EKG NSR today. Will decrease amiodarone 200 mg daily. Next visit check liver function, TSH and free T4. 7) Anticoagulation management - Check INR. Goal 2.0-3.0. Will adjust coumadin accordingly.  8) RV failure - Fluid status much improved. Continue low dose BB.   Over 40 minutes spent with greater than 1/2 of that time dedicated to the above and coordinating care.   Patient out of medications, has lost Medicaid benefits and can't afford medications. Assisted patient with money for medications at OP pharmacy. Asked to please bring all medications to next visit and every visit.   F/U 2 weeks [name] B NP-C 12:25 PM   Addendum: OP PT therapy called by VAD Coordinator and patient has been missing PT appointments and they took walker away because he was tripping over it. They have recommended AFO and knee extension brace for unsteady gait but patient missed appointment yesterday to get fitted. Will  call  patient and encourage him to go to appointments and to get fitted for AFO and knee extension brace.

## 2014-02-03 NOTE — Telephone Encounter (Signed)
Called pt, left message reminding pt he has 12 noon appt today in VAD clinic.

## 2014-02-04 ENCOUNTER — Other Ambulatory Visit (HOSPITAL_COMMUNITY): Payer: Self-pay | Admitting: Internal Medicine

## 2014-02-04 DIAGNOSIS — R1310 Dysphagia, unspecified: Secondary | ICD-10-CM

## 2014-02-04 NOTE — Addendum Note (Signed)
Encounter addended by: Ernestina Penna, CCT on: 02/04/2014  8:37 AM<BR>     Documentation filed: Charges VN

## 2014-02-07 ENCOUNTER — Other Ambulatory Visit (HOSPITAL_COMMUNITY): Payer: Self-pay

## 2014-02-07 ENCOUNTER — Ambulatory Visit (HOSPITAL_COMMUNITY)
Admission: RE | Admit: 2014-02-07 | Discharge: 2014-02-07 | Disposition: A | Discharge status: Home / Self Care | Payer: Medicare Other | Source: Ambulatory Visit | Attending: Internal Medicine | Admitting: Internal Medicine

## 2014-02-07 DIAGNOSIS — R1310 Dysphagia, unspecified: Secondary | ICD-10-CM | POA: Insufficient documentation

## 2014-02-07 DIAGNOSIS — Z95810 Presence of automatic (implantable) cardiac defibrillator: Secondary | ICD-10-CM | POA: Diagnosis not present

## 2014-02-07 DIAGNOSIS — G43909 Migraine, unspecified, not intractable, without status migrainosus: Secondary | ICD-10-CM | POA: Insufficient documentation

## 2014-02-07 DIAGNOSIS — I5022 Chronic systolic (congestive) heart failure: Secondary | ICD-10-CM | POA: Diagnosis not present

## 2014-02-07 DIAGNOSIS — I10 Essential (primary) hypertension: Secondary | ICD-10-CM | POA: Insufficient documentation

## 2014-02-07 DIAGNOSIS — R1312 Dysphagia, oropharyngeal phase: Secondary | ICD-10-CM | POA: Diagnosis present

## 2014-02-07 DIAGNOSIS — K219 Gastro-esophageal reflux disease without esophagitis: Secondary | ICD-10-CM | POA: Diagnosis not present

## 2014-02-07 DIAGNOSIS — I509 Heart failure, unspecified: Secondary | ICD-10-CM | POA: Insufficient documentation

## 2014-02-07 DIAGNOSIS — Z8673 Personal history of transient ischemic attack (TIA), and cerebral infarction without residual deficits: Secondary | ICD-10-CM | POA: Insufficient documentation

## 2014-02-07 DIAGNOSIS — I6312 Cerebral infarction due to embolism of basilar artery: Secondary | ICD-10-CM

## 2014-02-07 DIAGNOSIS — N289 Disorder of kidney and ureter, unspecified: Secondary | ICD-10-CM | POA: Insufficient documentation

## 2014-02-07 DIAGNOSIS — Z95811 Presence of heart assist device: Secondary | ICD-10-CM

## 2014-02-07 DIAGNOSIS — F17200 Nicotine dependence, unspecified, uncomplicated: Secondary | ICD-10-CM | POA: Diagnosis not present

## 2014-02-07 MED ORDER — CLOPIDOGREL BISULFATE 75 MG PO TABS: 75.0000 mg | ORAL_TABLET | Freq: Every day | ORAL | Status: DC

## 2014-02-07 NOTE — Procedures (Signed)
Objective Swallowing Evaluation: Modified Barium Swallowing Study  Patient Details  Name: [name] MRN: [id number] Date of Birth: 06/22/1961  Today's Date: 02/07/2014 Time: 1120-1138 SLP Time Calculation (min): 18 min  Past Medical History:  Past Medical History  Diagnosis Date  . CHF (congestive heart failure)     EF- 10-15  . Medically noncompliant   . Mitral regurgitation   . Tobacco user   . HTN (hypertension)   . AICD (automatic cardioverter/defibrillator) present   . GERD (gastroesophageal reflux disease)   . Substance abuse   . Chronic renal insufficiency   . Syncope   . Thrombus 08/06/2010  . SYSTOLIC HEART FAILURE, CHRONIC 09/22/2008    Qualifier: Diagnosis of  By: [name], MD, Trixie DredgeFACC, Daniel R   . LV (left ventricular) mural thrombus 01/28/2011  . ICD - IN SITU 09/16/2008    Qualifier: Diagnosis of  By: Wonda AmisBednar, NP-C, Michelle    . MITRAL STENOSIS/ INSUFFICIENCY, NON-RHEUMATIC 09/22/2008    Qualifier: Diagnosis of  By: [name], MD, Trixie DredgeFACC, Daniel R   . Hepatomegaly 09/16/2008    Qualifier: Diagnosis of  By: Wonda AmisBednar, NP-C, Michelle    . High cholesterol 02/26/2012    "at one time"  . Sleep apnea   . Exertional dyspnea 02/26/2012  . History of blood transfusion 08/2011    "when I had heart pump"  . Migraines   . COMMON MIGRAINE 06/14/2009    Qualifier: Diagnosis of  By: Jonny RuizJohn MD, Len BlalockJames W   . History of gout 02/26/2012  . Depression 08/11/2013    Pt denies  . Bipolar affective disorder 10/22/2011    pt denies this hx 02/26/2012   Past Surgical History:  Past Surgical History  Procedure Laterality Date  . Cardiac defibrillator placement  ~ 2008  . Left ventricular assist device  08/2011  . Radiology with anesthesia N/A 07/19/2013    Procedure: RADIOLOGY WITH ANESTHESIA;  Surgeon: Oneal GroutSanjeev K Deveshwar, MD;  Location: [facility] OR;  Service: Radiology;  Laterality: N/A;  . Tracheostomy      feinstein   HPI:  53 year old male with h/o AICD, GERD, HTN, renal insufficiency, migraines,  CVA, seen for OP MBS. OP SLP reporting intermittent c/o coughing with liquids however patient denying when questioned.      Assessment / Plan / Recommendation Clinical Impression  Dysphagia Diagnosis: Mild oral phase dysphagia;Mild pharyngeal phase dysphagia Clinical impression: Patient presents with a very mild sensory-motor based oropharyngeal dysphagia marked by a delayed swallow initiation, occassionally to the level of the pyriform sinuses, which results in intermittent flash penetration of thin liquids. No deep penetration or aspiration noted. Education complete with patient on general safe swallowing precautions to decrease aspiration risk which is overall minimal at this time.     Treatment Recommendation  Defer treatment plan to SLP at (Comment) (OP)    Diet Recommendation Regular;Thin liquid   Liquid Administration via: Cup;Straw Medication Administration: Whole meds with liquid Supervision: Patient able to self feed Compensations: Slow rate;Small sips/bites Postural Changes and/or Swallow Maneuvers: Seated upright 90 degrees    Other  Recommendations Oral Care Recommendations: Oral care BID              General HPI: 53 year old male with h/o AICD, GERD, HTN, renal insufficiency, migraines, CVA, seen for OP MBS. OP SLP reporting intermittent c/o coughing with liquids however patient denying when questioned.  Type of Study: Modified Barium Swallowing Study Reason for Referral: Objectively evaluate swallowing function Previous Swallow Assessment: per notes, MBS at  [facility] in March or April of 2015 Diet Prior to this Study: Regular;Thin liquids Temperature Spikes Noted: No Respiratory Status: Room air History of Recent Intubation: No Behavior/Cognition: Alert;Cooperative;Pleasant mood Oral Cavity - Dentition: Adequate natural dentition Oral Motor / Sensory Function:  (mild left sided weakness (labial, facial)) Self-Feeding Abilities: Able to feed self Patient Positioning:  Upright in bed Baseline Vocal Quality: Clear Volitional Cough: Strong (with cueing) Volitional Swallow: Able to elicit Anatomy: Within functional limits Pharyngeal Secretions: Not observed secondary MBS    Reason for Referral Objectively evaluate swallowing function   Oral Phase Oral Preparation/Oral Phase Oral Phase: Impaired Oral - Thin Oral - Thin Cup: Delayed oral transit (mild) Oral - Thin Straw: Delayed oral transit Oral - Solids Oral - Puree: Delayed oral transit Oral - Mechanical Soft: Delayed oral transit Oral - Pill: Delayed oral transit   Pharyngeal Phase Pharyngeal Phase Pharyngeal Phase: Impaired Pharyngeal - Thin Pharyngeal - Thin Cup: Delayed swallow initiation;Premature spillage to valleculae;Premature spillage to pyriform sinuses;Penetration/Aspiration before swallow Penetration/Aspiration details (thin cup): Material enters airway, remains ABOVE vocal cords then ejected out Pharyngeal - Thin Straw: Delayed swallow initiation;Premature spillage to valleculae;Premature spillage to pyriform sinuses;Penetration/Aspiration before swallow Penetration/Aspiration details (thin straw): Material enters airway, remains ABOVE vocal cords then ejected out Pharyngeal - Solids Pharyngeal - Puree: Delayed swallow initiation;Premature spillage to valleculae Pharyngeal - Mechanical Soft: Delayed swallow initiation;Premature spillage to valleculae Pharyngeal - Pill:  (mild premature spillage over base of tongue)  Cervical Esophageal Phase    GO    Cervical Esophageal Phase Cervical Esophageal Phase: [facility]    Functional Assessment Tool Used: skilled clinical judgement Functional Limitations: Swallowing Swallow Current Status ([id number]): At least 1 percent but less than 20 percent impaired, limited or restricted Swallow Goal Status [phone]): At least 1 percent but less than 20 percent impaired, limited or restricted Swallow Discharge Status [phone]): At least 1 percent but less  than 20 percent impaired, limited or restricted   [facility] MA, CCC-SLP [phone]  Ferdinand Lango [name] 02/07/2014, 11:56 AM

## 2014-02-09 ENCOUNTER — Ambulatory Visit: Payer: Medicare Other

## 2014-02-09 ENCOUNTER — Ambulatory Visit: Payer: Medicare Other | Admitting: Physical Therapy

## 2014-02-09 ENCOUNTER — Ambulatory Visit: Payer: Medicare Other | Admitting: Occupational Therapy

## 2014-02-09 DIAGNOSIS — Z5189 Encounter for other specified aftercare: Secondary | ICD-10-CM | POA: Diagnosis not present

## 2014-02-11 ENCOUNTER — Ambulatory Visit: Payer: Medicare Other

## 2014-02-11 ENCOUNTER — Ambulatory Visit: Payer: Medicare Other | Admitting: Occupational Therapy

## 2014-02-11 ENCOUNTER — Ambulatory Visit: Payer: Medicare Other | Admitting: Physical Therapy

## 2014-02-11 DIAGNOSIS — Z5189 Encounter for other specified aftercare: Secondary | ICD-10-CM | POA: Diagnosis not present

## 2014-02-16 ENCOUNTER — Ambulatory Visit: Payer: Medicare Other

## 2014-02-16 ENCOUNTER — Encounter: Payer: Medicare Other | Admitting: Occupational Therapy

## 2014-02-17 ENCOUNTER — Encounter (HOSPITAL_COMMUNITY): Payer: Medicare Other

## 2014-02-18 ENCOUNTER — Ambulatory Visit: Payer: Medicare Other | Admitting: Physical Therapy

## 2014-02-18 ENCOUNTER — Ambulatory Visit: Payer: Medicare Other | Admitting: Occupational Therapy

## 2014-02-18 ENCOUNTER — Ambulatory Visit: Payer: Medicare Other

## 2014-02-18 DIAGNOSIS — Z5189 Encounter for other specified aftercare: Secondary | ICD-10-CM | POA: Diagnosis not present

## 2014-02-21 ENCOUNTER — Ambulatory Visit: Payer: Medicare Other

## 2014-02-21 ENCOUNTER — Other Ambulatory Visit (HOSPITAL_COMMUNITY): Payer: Self-pay | Admitting: *Deleted

## 2014-02-21 ENCOUNTER — Telehealth (HOSPITAL_COMMUNITY): Payer: Self-pay | Admitting: *Deleted

## 2014-02-21 DIAGNOSIS — Z5189 Encounter for other specified aftercare: Secondary | ICD-10-CM | POA: Diagnosis not present

## 2014-02-21 DIAGNOSIS — Z95811 Presence of heart assist device: Secondary | ICD-10-CM

## 2014-02-21 DIAGNOSIS — Z7901 Long term (current) use of anticoagulants: Secondary | ICD-10-CM

## 2014-02-21 NOTE — Telephone Encounter (Signed)
Called pt re: missed clinic appt last week (02/17/14). Asked pt to get INR drawn at [facility] tomorrow; pt agreed to same. Asked him to call and re-schedule clinic appt.

## 2014-02-22 ENCOUNTER — Other Ambulatory Visit: Payer: Medicare Other

## 2014-02-23 ENCOUNTER — Ambulatory Visit: Payer: Medicare Other

## 2014-02-23 ENCOUNTER — Telehealth (HOSPITAL_COMMUNITY): Payer: Self-pay | Admitting: *Deleted

## 2014-02-23 ENCOUNTER — Encounter (HOSPITAL_COMMUNITY): Payer: Self-pay | Admitting: *Deleted

## 2014-02-23 NOTE — Telephone Encounter (Signed)
Called pt re: missed INR check yesterday. Pt "forgot", but will go tomorrow to [facility] for INR check.  He is also requesting a "doctor's note" for [employer]. He says they are going to turn his power off; informed him we can provide duplicate letter re: his need for electrical power to run his LVAD, but that is no guarantee of power per [employer]. He is asking for an extension and wants duplicate letter; will leave letter at desk for pt to pick up tomorrow. Asked pt to re-schedule his missed clinic appt as well.

## 2014-02-24 ENCOUNTER — Ambulatory Visit (INDEPENDENT_AMBULATORY_CARE_PROVIDER_SITE_OTHER): Payer: Medicare Other | Admitting: *Deleted

## 2014-02-24 ENCOUNTER — Ambulatory Visit (HOSPITAL_COMMUNITY): Payer: Self-pay | Admitting: *Deleted

## 2014-02-24 DIAGNOSIS — Z95811 Presence of heart assist device: Secondary | ICD-10-CM

## 2014-02-24 DIAGNOSIS — Z7901 Long term (current) use of anticoagulants: Secondary | ICD-10-CM

## 2014-02-24 LAB — PROTIME-INR
INR: 4.1 ratio — ABNORMAL HIGH (ref 0.8–1.0)
Prothrombin Time: 44.1 s — ABNORMAL HIGH (ref 9.6–13.1)

## 2014-03-02 ENCOUNTER — Encounter: Payer: Medicare Other | Admitting: Occupational Therapy

## 2014-03-02 ENCOUNTER — Other Ambulatory Visit: Payer: Medicare Other

## 2014-03-02 ENCOUNTER — Ambulatory Visit: Payer: Medicare Other

## 2014-03-03 ENCOUNTER — Telehealth (HOSPITAL_COMMUNITY): Payer: Self-pay | Admitting: *Deleted

## 2014-03-03 ENCOUNTER — Telehealth (HOSPITAL_COMMUNITY): Payer: Self-pay | Admitting: Vascular Surgery

## 2014-03-03 NOTE — Telephone Encounter (Signed)
Called pt left message re: missed INR yesterday; asked that he get INR today.

## 2014-03-04 ENCOUNTER — Ambulatory Visit: Payer: Medicare Other | Attending: Internal Medicine | Admitting: Physical Therapy

## 2014-03-04 ENCOUNTER — Ambulatory Visit: Payer: Medicare Other | Admitting: Occupational Therapy

## 2014-03-04 ENCOUNTER — Telehealth: Payer: Self-pay | Admitting: *Deleted

## 2014-03-04 DIAGNOSIS — Z8673 Personal history of transient ischemic attack (TIA), and cerebral infarction without residual deficits: Secondary | ICD-10-CM | POA: Insufficient documentation

## 2014-03-04 DIAGNOSIS — R279 Unspecified lack of coordination: Secondary | ICD-10-CM | POA: Insufficient documentation

## 2014-03-04 DIAGNOSIS — R1319 Other dysphagia: Secondary | ICD-10-CM | POA: Insufficient documentation

## 2014-03-04 DIAGNOSIS — I509 Heart failure, unspecified: Secondary | ICD-10-CM | POA: Insufficient documentation

## 2014-03-04 DIAGNOSIS — M6281 Muscle weakness (generalized): Secondary | ICD-10-CM | POA: Insufficient documentation

## 2014-03-04 DIAGNOSIS — M2420 Disorder of ligament, unspecified site: Secondary | ICD-10-CM | POA: Insufficient documentation

## 2014-03-04 DIAGNOSIS — Z5189 Encounter for other specified aftercare: Secondary | ICD-10-CM | POA: Diagnosis present

## 2014-03-04 DIAGNOSIS — M629 Disorder of muscle, unspecified: Secondary | ICD-10-CM | POA: Diagnosis not present

## 2014-03-04 NOTE — Telephone Encounter (Signed)
Called pt again and left message re: missed INR as well as missed clinic appt. Asked that he go to [facility] for INR check asap and call [phone] to schedule VAD clinic appt.

## 2014-03-08 ENCOUNTER — Other Ambulatory Visit (HOSPITAL_COMMUNITY): Payer: Self-pay | Admitting: Adult Health

## 2014-03-09 ENCOUNTER — Ambulatory Visit: Payer: Medicare Other | Admitting: Physical Therapy

## 2014-03-09 ENCOUNTER — Ambulatory Visit: Payer: Medicare Other | Admitting: Occupational Therapy

## 2014-03-09 ENCOUNTER — Telehealth (HOSPITAL_COMMUNITY): Payer: Self-pay | Admitting: Vascular Surgery

## 2014-03-09 ENCOUNTER — Ambulatory Visit: Payer: Medicare Other

## 2014-03-09 DIAGNOSIS — Z5189 Encounter for other specified aftercare: Secondary | ICD-10-CM | POA: Diagnosis not present

## 2014-03-11 ENCOUNTER — Ambulatory Visit: Payer: Medicare Other | Admitting: Occupational Therapy

## 2014-03-11 ENCOUNTER — Ambulatory Visit: Payer: Medicare Other | Admitting: Physical Therapy

## 2014-03-11 DIAGNOSIS — Z5189 Encounter for other specified aftercare: Secondary | ICD-10-CM | POA: Diagnosis not present

## 2014-03-14 ENCOUNTER — Other Ambulatory Visit (HOSPITAL_COMMUNITY): Payer: Self-pay | Admitting: *Deleted

## 2014-03-14 ENCOUNTER — Encounter (HOSPITAL_COMMUNITY): Payer: Self-pay

## 2014-03-14 ENCOUNTER — Ambulatory Visit (HOSPITAL_COMMUNITY): Payer: Self-pay | Admitting: *Deleted

## 2014-03-14 ENCOUNTER — Ambulatory Visit (HOSPITAL_COMMUNITY)
Admission: RE | Admit: 2014-03-14 | Discharge: 2014-03-14 | Disposition: A | Discharge status: Home / Self Care | Payer: Medicare Other | Source: Ambulatory Visit | Attending: Cardiology | Admitting: Cardiology

## 2014-03-14 VITALS — BP 86/0 | HR 92 | Ht 64.0 in | Wt 169.2 lb

## 2014-03-14 DIAGNOSIS — R55 Syncope and collapse: Secondary | ICD-10-CM | POA: Diagnosis not present

## 2014-03-14 DIAGNOSIS — I059 Rheumatic mitral valve disease, unspecified: Secondary | ICD-10-CM | POA: Insufficient documentation

## 2014-03-14 DIAGNOSIS — I4892 Unspecified atrial flutter: Secondary | ICD-10-CM | POA: Diagnosis not present

## 2014-03-14 DIAGNOSIS — I509 Heart failure, unspecified: Secondary | ICD-10-CM | POA: Diagnosis not present

## 2014-03-14 DIAGNOSIS — I5022 Chronic systolic (congestive) heart failure: Secondary | ICD-10-CM | POA: Insufficient documentation

## 2014-03-14 DIAGNOSIS — N189 Chronic kidney disease, unspecified: Secondary | ICD-10-CM | POA: Diagnosis not present

## 2014-03-14 DIAGNOSIS — R0609 Other forms of dyspnea: Secondary | ICD-10-CM | POA: Insufficient documentation

## 2014-03-14 DIAGNOSIS — I69998 Other sequelae following unspecified cerebrovascular disease: Secondary | ICD-10-CM | POA: Diagnosis not present

## 2014-03-14 DIAGNOSIS — Z7901 Long term (current) use of anticoagulants: Secondary | ICD-10-CM

## 2014-03-14 DIAGNOSIS — Z91199 Patient's noncompliance with other medical treatment and regimen due to unspecified reason: Secondary | ICD-10-CM | POA: Diagnosis not present

## 2014-03-14 DIAGNOSIS — Z86718 Personal history of other venous thrombosis and embolism: Secondary | ICD-10-CM | POA: Diagnosis not present

## 2014-03-14 DIAGNOSIS — Z79899 Other long term (current) drug therapy: Secondary | ICD-10-CM

## 2014-03-14 DIAGNOSIS — G43909 Migraine, unspecified, not intractable, without status migrainosus: Secondary | ICD-10-CM | POA: Diagnosis not present

## 2014-03-14 DIAGNOSIS — M109 Gout, unspecified: Secondary | ICD-10-CM | POA: Diagnosis not present

## 2014-03-14 DIAGNOSIS — Z7902 Long term (current) use of antithrombotics/antiplatelets: Secondary | ICD-10-CM | POA: Diagnosis not present

## 2014-03-14 DIAGNOSIS — R29898 Other symptoms and signs involving the musculoskeletal system: Secondary | ICD-10-CM | POA: Diagnosis not present

## 2014-03-14 DIAGNOSIS — I129 Hypertensive chronic kidney disease with stage 1 through stage 4 chronic kidney disease, or unspecified chronic kidney disease: Secondary | ICD-10-CM | POA: Insufficient documentation

## 2014-03-14 DIAGNOSIS — D65 Disseminated intravascular coagulation [defibrination syndrome]: Secondary | ICD-10-CM | POA: Insufficient documentation

## 2014-03-14 DIAGNOSIS — G4730 Sleep apnea, unspecified: Secondary | ICD-10-CM | POA: Diagnosis not present

## 2014-03-14 DIAGNOSIS — Z7982 Long term (current) use of aspirin: Secondary | ICD-10-CM | POA: Diagnosis not present

## 2014-03-14 DIAGNOSIS — Z95811 Presence of heart assist device: Secondary | ICD-10-CM | POA: Diagnosis not present

## 2014-03-14 DIAGNOSIS — F17200 Nicotine dependence, unspecified, uncomplicated: Secondary | ICD-10-CM | POA: Insufficient documentation

## 2014-03-14 DIAGNOSIS — R160 Hepatomegaly, not elsewhere classified: Secondary | ICD-10-CM | POA: Insufficient documentation

## 2014-03-14 DIAGNOSIS — F319 Bipolar disorder, unspecified: Secondary | ICD-10-CM | POA: Diagnosis not present

## 2014-03-14 DIAGNOSIS — R0989 Other specified symptoms and signs involving the circulatory and respiratory systems: Secondary | ICD-10-CM | POA: Insufficient documentation

## 2014-03-14 DIAGNOSIS — Z9119 Patient's noncompliance with other medical treatment and regimen: Secondary | ICD-10-CM | POA: Insufficient documentation

## 2014-03-14 DIAGNOSIS — I484 Atypical atrial flutter: Secondary | ICD-10-CM

## 2014-03-14 DIAGNOSIS — E7800 Pure hypercholesterolemia, unspecified: Secondary | ICD-10-CM | POA: Diagnosis not present

## 2014-03-14 DIAGNOSIS — K219 Gastro-esophageal reflux disease without esophagitis: Secondary | ICD-10-CM | POA: Insufficient documentation

## 2014-03-14 LAB — COMPREHENSIVE METABOLIC PANEL
ALK PHOS: 181 U/L — AB (ref 39–117)
ALT: 57 U/L — ABNORMAL HIGH (ref 0–53)
ANION GAP: 15 (ref 5–15)
AST: 55 U/L — ABNORMAL HIGH (ref 0–37)
Albumin: 3.8 g/dL (ref 3.5–5.2)
BILIRUBIN TOTAL: 0.9 mg/dL (ref 0.3–1.2)
BUN: 19 mg/dL (ref 6–23)
CHLORIDE: 98 meq/L (ref 96–112)
CO2: 23 mEq/L (ref 19–32)
Calcium: 9.3 mg/dL (ref 8.4–10.5)
Creatinine, Ser: 1.23 mg/dL (ref 0.50–1.35)
GFR calc non Af Amer: 66 mL/min — ABNORMAL LOW (ref >90)
GFR, EST AFRICAN AMERICAN: 77 mL/min — AB (ref >90)
Glucose, Bld: 109 mg/dL — ABNORMAL HIGH (ref 70–99)
POTASSIUM: 4.5 meq/L (ref 3.7–5.3)
Sodium: 136 mEq/L — ABNORMAL LOW (ref 137–147)
Total Protein: 8.2 g/dL (ref 6.0–8.3)

## 2014-03-14 LAB — CBC
HEMATOCRIT: 37.9 % — AB (ref 39.0–52.0)
Hemoglobin: 12.1 g/dL — ABNORMAL LOW (ref 13.0–17.0)
MCH: 22.9 pg — ABNORMAL LOW (ref 26.0–34.0)
MCHC: 31.9 g/dL (ref 30.0–36.0)
MCV: 71.8 fL — ABNORMAL LOW (ref 78.0–100.0)
Platelets: 342 10*3/uL (ref 150–400)
RBC: 5.28 MIL/uL (ref 4.22–5.81)
RDW: 20 % — AB (ref 11.5–15.5)
WBC: 3.5 10*3/uL — ABNORMAL LOW (ref 4.0–10.5)

## 2014-03-14 LAB — PROTIME-INR
INR: 3.79 — ABNORMAL HIGH (ref 0.00–1.49)
Prothrombin Time: 37.4 seconds — ABNORMAL HIGH (ref 11.6–15.2)

## 2014-03-14 LAB — LACTATE DEHYDROGENASE: LDH: 556 U/L — ABNORMAL HIGH (ref 94–250)

## 2014-03-14 LAB — TSH: TSH: 5.69 u[IU]/mL — ABNORMAL HIGH (ref 0.350–4.500)

## 2014-03-14 LAB — PRO B NATRIURETIC PEPTIDE: Pro B Natriuretic peptide (BNP): 605.4 pg/mL — ABNORMAL HIGH (ref 0–125)

## 2014-03-14 LAB — T4, FREE: Free T4: 1.39 ng/dL (ref 0.80–1.80)

## 2014-03-14 MED ORDER — ATORVASTATIN CALCIUM 40 MG PO TABS: 40.0000 mg | ORAL_TABLET | Freq: Every day | ORAL | Status: DC

## 2014-03-14 MED ORDER — WARFARIN SODIUM 4 MG PO TABS: ORAL_TABLET | ORAL | Status: DC

## 2014-03-14 NOTE — Progress Notes (Addendum)
Patient ID: [name], male   DOB: 1961-02-09, 53 y.o.   MRN: [id number]  Primary Physician: Dr. Oliver Barre  Primary Cardiologist: Dr. 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   He was admitted to [facility] January 2015 with NSTEMI and RV failure. He had a difficult course that was complicated by embolic right CVA. He was intubated and eventually had a tracheostomy placed 07/28/13. He was transferred to inpatient rehab for extensive rehab and was making progression, however despite aggressive therapy with heparin, coumadin, ASA and Plavix, his LDH remained persistently elevated in the 950 range (was about 1750 on admit). He did not experience any pump dysfunction, however the case was discussed with Dr Allena Katz and [name] at [facility] and it was felt to transfer him down there for possible pump exchange. He underwent a pump exchange 08/31/13. Post op was complicated by bleeding and he received multiple transfusions. He had AKI and had intermittent HD and underwent last HD on 09/24/13. Last Cr on chart 1.9 (10/02/13)  Admitted 5/13-5/18/15 for volume overload and possible hemolysis. LDH 647 and INR 1.69 on admission. Treated with IV lasix and IV heparin. While in the hospital developed some PI events and speed was decreased to 9400. ICD interrogated and showed A flutter and he was placed on IV amiodarone and then transitioned to 200 mg PO BID. He cardioverted to NSR on 5/15. LDH trended down and on discharge was 530 and INR 2.4.  Follow up for Heart Failure and LVAD: Last visit we decreased amiodarone to 200 mg daily. Missed appointment last time. Doing well. Denies SOB, PND, orthopnea or CP. Lost Medicaid and is having trouble getting it back, not sure patient is sure what he needs to do to obtain. Still thinks his L side is getting weaker or no change. Taking medications as  prescribed. No bleeding issues. Able to walk around the whole grocery store with no issues. Weight at home 161 lbs. Fallen 4x since last visit, d/t lost his balance.   SH: Disabled, lives at home with caregiver. No ETOH or smoking FH: Mother living; HTN        Father living; no health issues   No alarms.  Denies driveline trauma, erythema or drainage.  Denies ICD shocks.   Reports taking Coumadin as prescribed and adherence to anticoagulation based dietary restrictions.  Denies bright red blood per rectum or melena, no dark urine or hematuria.     Past Medical History  Diagnosis Date  . CHF (congestive heart failure)     EF- 10-15  . Medically noncompliant   . Mitral regurgitation   . Tobacco user   . HTN (hypertension)   . AICD (automatic cardioverter/defibrillator) present   . GERD (gastroesophageal reflux disease)   . Substance abuse   . Chronic renal insufficiency   . Syncope   . Thrombus 08/06/2010  . SYSTOLIC HEART FAILURE, CHRONIC 09/22/2008    Qualifier: Diagnosis of  By: Gala Romney, MD, Trixie Dredge   . LV (left ventricular) mural thrombus 01/28/2011  . ICD - IN SITU 09/16/2008    Qualifier: Diagnosis of  By: Wonda Amis    . MITRAL STENOSIS/ INSUFFICIENCY, NON-RHEUMATIC 09/22/2008    Qualifier: Diagnosis of  By: Gala Romney, MD, Trixie Dredge Hepatomegaly 09/16/2008    Qualifier: Diagnosis of  By: Fonnie Jarvis  NP-C, Marcelino Duster    . High cholesterol 02/26/2012    "at one time"  . Sleep apnea   . Exertional dyspnea 02/26/2012  . History of blood transfusion 08/2011    "when I had heart pump"  . Migraines   . COMMON MIGRAINE 06/14/2009    Qualifier: Diagnosis of  By: Jonny Ruiz MD, Len Blalock   . History of gout 02/26/2012  . Depression 08/11/2013    Pt denies  . Bipolar affective disorder 10/22/2011    pt denies this hx 02/26/2012    Current Outpatient Prescriptions  Medication Sig Dispense Refill  . amiodarone (PACERONE) 200 MG tablet Take 1 tablet (200 mg total) by mouth  daily.  60 tablet  6  . aspirin EC 325 MG EC tablet Take 1 tablet (325 mg total) by mouth daily.  30 tablet  6  . carvedilol (COREG) 3.125 MG tablet Take 1 tablet (3.125 mg total) by mouth 2 (two) times daily with a meal.  60 tablet  6  . clopidogrel (PLAVIX) 75 MG tablet Take 1 tablet (75 mg total) by mouth daily with breakfast.  30 tablet  6  . furosemide (LASIX) 40 MG tablet Take 40 mg daily; may take extra 40 mg for wt gain of 3 lbs/one day or 5 lbs/one week  60 tablet  6  . hydrALAZINE (APRESOLINE) 50 MG tablet Take 1 tablet (50 mg total) by mouth 3 (three) times daily.  90 tablet  6  . isosorbide mononitrate (IMDUR) 60 MG 24 hr tablet Take 1 tablet (60 mg total) by mouth daily.  30 tablet  6  . losartan (COZAAR) 50 MG tablet Take 1 tablet (50 mg total) by mouth 2 (two) times daily.  60 tablet  6  . mirtazapine (REMERON) 30 MG tablet TAKE 1 TABLET AT BEDTIME  30 tablet  2  . potassium chloride SA (K-DUR,KLOR-CON) 20 MEQ tablet Take two tabs daily. May take one extra tab on days you take extra lasix  80 tablet  6  . tamsulosin (FLOMAX) 0.4 MG CAPS capsule Take 1 capsule (0.4 mg total) by mouth daily.  30 capsule  6  . warfarin (COUMADIN) 3 MG tablet Take 3 mg by mouth daily. For INR 2.0 - 3.0      . warfarin (COUMADIN) 4 MG tablet Take one tab daily or as instructed for INR 2.0 - 3.0  60 tablet  6   No current facility-administered medications for this encounter.   Facility-Administered Medications Ordered in Other Encounters  Medication Dose Route Frequency Provider Last Rate Last Dose  . sodium chloride 0.9 % injection 10-40 mL  10-40 mL Intracatheter PRN [name], MD        Ace inhibitors and Lexapro  REVIEW OF SYSTEMS: All systems negative except as listed in HPI, PMH and Problem list.   LVAD INTERROGATION:   HeartMate II LVAD: Flow --- liters/min Speed 9390 Power 5.1 PI 5.7 PI events: 3-5 a day; some days 8-10; 9/12 2 low flow alarms; replace back up battery  alarm Alarms: a few low voltage advisories Low speed: 8800 Set speed 9400  I reviewed the LVAD parameters from today, and compared the results to the patient's prior recorded data. No changes to LVAD speed. The LVAD is functioning within specified parameters.  The patient performs LVAD self-test daily.  LVAD interrogation was negative for any significant power changes, alarms or PI events/speed drops.  LVAD equipment check completed and is in good working order.  Back-up  equipment present.   LVAD education done on emergency procedures and precautions and reviewed exit site care.    Filed Vitals:   03/14/14 1151  BP: 86/0  Pulse: 112  Height:  (1.626 m)  Weight: 169 lb 3.2 oz (76.749 kg)  SpO2: 98%    Physical Exam: GENERAL: Well appearing, male who presents to clinic today HEENT: normal  NECK: Supple, JVP flat, .2+ bilaterally, no bruits.  No lymphadenopathy or thyromegaly appreciated.   CARDIAC:  Mechanical heart sounds with LVAD hum present, prominent heart sounds LUNGS:  Clear to auscultation bilaterally.  ABDOMEN:  Soft, round, nontender, positive bowel sounds x4.     LVAD exit site: well-healed and incorporated. Black dried skin;  No erythema or drainage.  Stabilization device present and accurately applied. Dressing being changed weekly. EXTREMITIES:  Warm and dry, no cyanosis, clubbing, rash, trace edema L>R NEUROLOGIC:  Alert and oriented x 4.  Gait steady.  No aphasia.  No dysarthria.  Affect pleasant.     EKG: NSR 92 bpm, RBBB, QTc 519 msec  ASSESSMENT AND PLAN:   1) Chronic systolic HF: NICM, s/p LVAD implant (08/2011) and subsequent (08/2013) - NYHA II symptoms and volume status stable. He maybe a little dry he is having --- and more PI events. Will have him hold lasix for 2 days and then go back to lasix 40 mg daily.  - MAP stable will continue current medications. Patient really is not sure what doses and what medications he is taking. From last visit we had coreg  3.125 mg BID, hydralazine 50 mg TID, Imdur 60 mg daily and losartan 50 mg BID.  -  Reinforced the need and importance of daily weights, a low sodium diet, and fluid restriction (less than 2 L a day). Instructed to call the HF clinic if weight increases more than 3 lbs overnight or 5 lbs in a week.  2) LVAD, s/p (08/2011) and then (08/2013) d/t pump thrombosis - LVAD parameters for the most part are stable. He is having --- and has had 2 low flow alarms. As above concerned he maybe a little dry. - Has had a few alarms reporting change back-up battery. The back-up battery is not expired, will change in clinic. - Check CBC, BMET, INR, LDH and pro-BNP 3) HTN - stable. As above continue current medications and stressed to patient to bring all medications to all visits.  4) Hemolysis - Check LDH. Will continue plavix, coumadin and ASA. No s/s of bleeding. He has been out of Plavix and provided patient with funds from clinic for plavix.  5) CVA - Stable.Continues to have left sided weakness. He is enrolled in OP PT/OT and told him to not miss appointments. Will start atorvastatin 40 mg daily.  6) Atrial Flutter: - EKG NSR today. Check TSH and free T4. Need to get back enrolled with ICD clinic.  7) Anticoagulation management - Check INR. Goal 2.0-3.0. Will adjust coumadin accordingly.  8) RV failure - Fluid status much improved. Continue low dose BB.  9) Social issues - patient has lost Medicaid d/t he was in the hospital when a piece of paperwork was sent to him. He supposedly has a CSW and have asked him to get her number and have her call us to try and help get his Medicaid back.   F/U 2 weeks [name] B NP-C 12:03 PM

## 2014-03-14 NOTE — Patient Instructions (Addendum)
Hold your lasix pill for 2 days.  Go to the outpatient pharmacy and pick up your atorvastatin and plavix.  Start taking atorvastatin 40 mg daily.   Go to [facility] and pick up your Coumadin 4 mg tablets. Hold your coumadin for today and take 2 mg (1/2 tablet) tomorrow. Then go back to 4 mg daily. Recheck next week.  Come tonight at 5:30 to the dinner and bring the Medicaid workers number.  Follow up in 2 weeks.  Do the following things EVERYDAY: 1) Weigh yourself in the morning before breakfast. Write it down and keep it in a log. 2) Take your medicines as prescribed 3) Eat low salt foods-Limit salt (sodium) to 2000 mg per day.  4) Stay as active as you can everyday 5) Limit all fluids for the day to less than 2 liters 6)

## 2014-03-14 NOTE — Progress Notes (Signed)
Symptom  Yes  No  Details   Angina         x Activity:   Claudication         x How far:   Syncope         x When:   Stroke        x    Orthopnea         x How many pillows: one  PND         x How often:  CPAP      N/A How many hrs:   Pedal edema          x         left leg only  Abd fullness         x   N&V         x  good appetite; eats full meals  Diaphoresis         x When:  Bleeding        x   Urine color     yellow  SOB         x  Activity:  incline  Palpitations         x When:  ICD shock         x   Hospitlizaitons                x When/where/why:    ED visit         x When/where/why:  Other MD         x When/who/why:  Activity          [facility] 2 - 3 x weekly  Fluid           No limitations   Diet     No limitations   Vital signs: HR:  112 MAP BP: 86 O2 Sat: 98 Wt: 169.2 lbs   Last wt: 183.6  lbs Ht: 5'4"  LVAD interrogation reveals:  Speed:  9390 Flow:  4.2 Power:  5.3 PI:  5.0 Alarms: 9/121/5 Low flow x 2; replace back up battery x 2    03/06/14 Low flow x 1 Events:  0 - 10  PI events Fixed speed:  9400 Low speed limit: 8800    LVAD exit site:  Well healed and incorporated. The velour is fully implanted at exit site. Pt doing his own dressing changes using Sorbaview dressing; stabilization device is not present. Dressing removed with old bloody drainage noted at exit site; no redness, tenderness, or foul odor noted. Pt has guaze over exit site instead of Biopatch.  Site cleansed; dressing replaced with Biopatch on exit site. Stressed importance of using this along with Centurion foley attachment device to prevent driveline trauma. Pt verbalized understanding of same.   I reviewed the LVAD parameters from today, and compared the results to the patient's prior recorded data. The LVAD is functioning within specified parameters.  LVAD interrogation was negative for any significant power changes; but had several low flow alarms on 9/12 and  03/06/14.  He also had "replace backup battery" advisories x 2 on 03/05/14.   A few PI events/speed drops present which is consistent with the patient's history.    Pt did not bring meds to clinic visit today as instructed; states his Medicaid has not been reinstated. Lasandra Beech, LSW in to speak with patient and asked if he could get Medicaid specialist contact information for her to assist  with Medicaid. Pt has "no minutes" on his phone. Pt has recently moved; obtained new home address and updated contact information in EMR  He says best way to get in touch with him is through mail.     LVAD equipment check completed and is in good working order. Back-up equipment not present; states he "left it in car".  Stressed importance of always keeping black bag with back up batteries, controller, and battery clips with him at all times; pt verbalized understanding of same.  Multiple "replace backup battery" advisories; event log downloaded; back up battery [id number] inserted properly; expires in 13 month. Replaced with [id number] date 09/29/13.  Will send [id number] along with event log to [employer] for evaluation.  Pt's gait remains unsteady. States he is going to [employer] 2 - 3 times weekly.  Pt states he needs paperwork completed by [place] to receive his knee brace and this has been faxed several times. VAD coordinator contacted [facility] and left message with our contact information asking for call back. Encouraged patient to f/u with plan of care at [facility].

## 2014-03-15 ENCOUNTER — Encounter: Payer: Self-pay | Admitting: Licensed Clinical Social Worker

## 2014-03-15 NOTE — Progress Notes (Signed)
CSW referred to assist with medicaid follow up. CSW assisted with completion of paperwork and contacted medicaid worker for follow up. CSW will continue to follow for any further needs related to [facility] application. Lasandra Beech, LCSW [phone]

## 2014-03-16 ENCOUNTER — Ambulatory Visit: Payer: Medicare Other | Admitting: Occupational Therapy

## 2014-03-16 ENCOUNTER — Ambulatory Visit: Payer: Medicare Other

## 2014-03-16 DIAGNOSIS — Z5189 Encounter for other specified aftercare: Secondary | ICD-10-CM | POA: Diagnosis not present

## 2014-03-18 ENCOUNTER — Encounter: Payer: Medicare Other | Admitting: Occupational Therapy

## 2014-03-18 ENCOUNTER — Ambulatory Visit: Payer: Medicare Other

## 2014-03-22 ENCOUNTER — Ambulatory Visit: Payer: Medicare Other | Admitting: Physical Therapy

## 2014-03-22 ENCOUNTER — Encounter (HOSPITAL_COMMUNITY): Payer: Self-pay | Admitting: *Deleted

## 2014-03-22 ENCOUNTER — Ambulatory Visit: Payer: Medicare Other | Admitting: Occupational Therapy

## 2014-03-22 DIAGNOSIS — Z5189 Encounter for other specified aftercare: Secondary | ICD-10-CM | POA: Diagnosis not present

## 2014-03-24 ENCOUNTER — Ambulatory Visit: Payer: Medicare Other

## 2014-03-24 ENCOUNTER — Encounter: Payer: Medicare Other | Admitting: Occupational Therapy

## 2014-03-25 NOTE — Telephone Encounter (Signed)
Open in error

## 2014-03-28 ENCOUNTER — Encounter (HOSPITAL_COMMUNITY): Payer: Medicare Other

## 2014-03-29 ENCOUNTER — Ambulatory Visit: Payer: Medicare Other

## 2014-03-29 ENCOUNTER — Encounter: Payer: Medicare Other | Admitting: Occupational Therapy

## 2014-03-31 ENCOUNTER — Ambulatory Visit (HOSPITAL_COMMUNITY): Payer: Self-pay | Admitting: *Deleted

## 2014-03-31 ENCOUNTER — Other Ambulatory Visit (HOSPITAL_COMMUNITY): Payer: Self-pay

## 2014-03-31 ENCOUNTER — Encounter (HOSPITAL_COMMUNITY): Payer: Self-pay | Admitting: *Deleted

## 2014-03-31 ENCOUNTER — Other Ambulatory Visit (HOSPITAL_COMMUNITY): Payer: Self-pay | Admitting: *Deleted

## 2014-03-31 ENCOUNTER — Ambulatory Visit (HOSPITAL_COMMUNITY)
Admission: RE | Admit: 2014-03-31 | Discharge: 2014-03-31 | Disposition: A | Discharge status: Home / Self Care | Payer: Medicare Other | Source: Ambulatory Visit | Attending: Internal Medicine | Admitting: Internal Medicine

## 2014-03-31 ENCOUNTER — Encounter: Payer: Self-pay | Admitting: Licensed Clinical Social Worker

## 2014-03-31 VITALS — BP 94/0 | HR 81 | Ht 64.0 in | Wt 169.0 lb

## 2014-03-31 DIAGNOSIS — I4892 Unspecified atrial flutter: Secondary | ICD-10-CM | POA: Insufficient documentation

## 2014-03-31 DIAGNOSIS — I509 Heart failure, unspecified: Secondary | ICD-10-CM | POA: Diagnosis present

## 2014-03-31 DIAGNOSIS — I69854 Hemiplegia and hemiparesis following other cerebrovascular disease affecting left non-dominant side: Secondary | ICD-10-CM | POA: Insufficient documentation

## 2014-03-31 DIAGNOSIS — Z95811 Presence of heart assist device: Secondary | ICD-10-CM

## 2014-03-31 DIAGNOSIS — Z7901 Long term (current) use of anticoagulants: Secondary | ICD-10-CM

## 2014-03-31 DIAGNOSIS — R791 Abnormal coagulation profile: Secondary | ICD-10-CM | POA: Diagnosis not present

## 2014-03-31 DIAGNOSIS — I159 Secondary hypertension, unspecified: Secondary | ICD-10-CM

## 2014-03-31 DIAGNOSIS — N189 Chronic kidney disease, unspecified: Secondary | ICD-10-CM | POA: Insufficient documentation

## 2014-03-31 DIAGNOSIS — I130 Hypertensive heart and chronic kidney disease with heart failure and stage 1 through stage 4 chronic kidney disease, or unspecified chronic kidney disease: Secondary | ICD-10-CM | POA: Diagnosis not present

## 2014-03-31 DIAGNOSIS — I5022 Chronic systolic (congestive) heart failure: Secondary | ICD-10-CM

## 2014-03-31 DIAGNOSIS — Z79899 Other long term (current) drug therapy: Secondary | ICD-10-CM | POA: Diagnosis not present

## 2014-03-31 DIAGNOSIS — Z8249 Family history of ischemic heart disease and other diseases of the circulatory system: Secondary | ICD-10-CM | POA: Diagnosis not present

## 2014-03-31 DIAGNOSIS — Z95810 Presence of automatic (implantable) cardiac defibrillator: Secondary | ICD-10-CM | POA: Insufficient documentation

## 2014-03-31 DIAGNOSIS — Z87891 Personal history of nicotine dependence: Secondary | ICD-10-CM | POA: Insufficient documentation

## 2014-03-31 DIAGNOSIS — I483 Typical atrial flutter: Secondary | ICD-10-CM

## 2014-03-31 DIAGNOSIS — Z7902 Long term (current) use of antithrombotics/antiplatelets: Secondary | ICD-10-CM | POA: Insufficient documentation

## 2014-03-31 DIAGNOSIS — K219 Gastro-esophageal reflux disease without esophagitis: Secondary | ICD-10-CM | POA: Diagnosis not present

## 2014-03-31 DIAGNOSIS — I5023 Acute on chronic systolic (congestive) heart failure: Secondary | ICD-10-CM | POA: Diagnosis not present

## 2014-03-31 LAB — CBC
HEMATOCRIT: 36 % — AB (ref 39.0–52.0)
Hemoglobin: 11.9 g/dL — ABNORMAL LOW (ref 13.0–17.0)
MCH: 23.4 pg — AB (ref 26.0–34.0)
MCHC: 33.1 g/dL (ref 30.0–36.0)
MCV: 70.7 fL — AB (ref 78.0–100.0)
Platelets: 313 10*3/uL (ref 150–400)
RBC: 5.09 MIL/uL (ref 4.22–5.81)
RDW: 20.3 % — AB (ref 11.5–15.5)
WBC: 2.4 10*3/uL — ABNORMAL LOW (ref 4.0–10.5)

## 2014-03-31 LAB — BASIC METABOLIC PANEL
Anion gap: 14 (ref 5–15)
BUN: 10 mg/dL (ref 6–23)
CALCIUM: 9.1 mg/dL (ref 8.4–10.5)
CO2: 24 mEq/L (ref 19–32)
CREATININE: 1.07 mg/dL (ref 0.50–1.35)
Chloride: 100 mEq/L (ref 96–112)
GFR, EST NON AFRICAN AMERICAN: 79 mL/min — AB (ref >90)
Glucose, Bld: 92 mg/dL (ref 70–99)
Potassium: 2.9 mEq/L — CL (ref 3.7–5.3)
Sodium: 138 mEq/L (ref 137–147)

## 2014-03-31 LAB — PROTIME-INR
INR: 9.46 (ref 0.00–1.49)
Prothrombin Time: 76.4 seconds — ABNORMAL HIGH (ref 11.6–15.2)

## 2014-03-31 LAB — PRO B NATRIURETIC PEPTIDE: Pro B Natriuretic peptide (BNP): 634.7 pg/mL — ABNORMAL HIGH (ref 0–125)

## 2014-03-31 LAB — LACTATE DEHYDROGENASE: LDH: 491 U/L — ABNORMAL HIGH (ref 94–250)

## 2014-03-31 MED ORDER — FUROSEMIDE 20 MG PO TABS: 20.0000 mg | ORAL_TABLET | Freq: Every day | ORAL | Status: DC

## 2014-03-31 NOTE — Progress Notes (Signed)
CSW referred to assist with medicaid issues and finances. Patient reports that his water is going to be shut off. Patient reports that the water bill is currently in the landlord's name and patient needs bill transferred into his name although is unable to pay the $150 deposit. Patient does not have a bill at this time but will return on Monday with copy of bill for further assistance with obtaining funding for deposit. Patient also reports $200 was deducted from his SSD direct deposit this month and he is unsure why. CSW recommended that patient return to Social Security to inquire about reduced monthly check. Patient mentioned that he is still awaiting return on pending medicaid recertification. CSW contacted medicaid worker Tammy Sours @ [phone] and left message for return call regarding status of medicaid application. CSW will continue to follow for assistance with above mentioned issues. Patient appears to verbalize understanding of needed follow up and will return to CSW next week. Lasandra Beech, LCSW [phone]

## 2014-03-31 NOTE — Progress Notes (Signed)
Patient ID: [name], male   DOB: 04/23/1961, 53 y.o.   MRN: [id number]  Primary Physician: Dr. Oliver BarreJames [name]  Primary Cardiologist: Dr. 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   He was admitted to [facility] January 2015 with NSTEMI and RV failure. He had a difficult course that was complicated by embolic right CVA. He was intubated and eventually had a tracheostomy placed 07/28/13. He was transferred to inpatient rehab for extensive rehab and was making progression, however despite aggressive therapy with heparin, coumadin, ASA and Plavix, his LDH remained persistently elevated in the 950 range (was about 1750 on admit). He did not experience any pump dysfunction, however the case was discussed with Dr Allena KatzPatel and Romona CurlsMilano at [facility] and it was felt to transfer him down there for possible pump exchange. He underwent a pump exchange 08/31/13. Post op was complicated by bleeding and he received multiple transfusions. He had AKI and had intermittent HD and underwent last HD on 09/24/13. Last Cr on chart 1.9 (10/02/13)  Admitted 5/13-5/18/15 for volume overload and possible hemolysis. LDH 647 and INR 1.69 on admission. Treated with IV lasix and IV heparin. While in the hospital developed some PI events and speed was decreased to 9400. ICD interrogated and showed A flutter and he was placed on IV amiodarone and then transitioned to 200 mg PO BID. He cardioverted to NSR on 5/15. LDH trended down and on discharge was 530 and INR 2.4.  Follow up for Heart Failure and LVAD: Last visit held lasix for 2 days and then go back to 40 mg daily. Doing well. Brought all medications to visit. Denies SOB, orthopnea, PND or CP. Has missed multiple appointments for INR and ate greens yesterday. No bleeding issues. Still does not have Medicaid. Able to walk around the whole grocery store with no  issues. Sometimes going to therapy and is going today to get foot brace. Weight at home 165 lbs. Has not fallen since last visit. Dizziness when straining and having a bowel movement.   SH: Disabled, lives at home with caregiver. No ETOH or smoking FH: Mother living; HTN        Father living; no health issues   No alarms.  Denies driveline trauma, erythema or drainage.  Denies ICD shocks.   Reports taking Coumadin as prescribed and adherence to anticoagulation based dietary restrictions.  Denies bright red blood per rectum or melena, no dark urine or hematuria.     Past Medical History  Diagnosis Date  . CHF (congestive heart failure)     EF- 10-15  . Medically noncompliant   . Mitral regurgitation   . Tobacco user   . HTN (hypertension)   . AICD (automatic cardioverter/defibrillator) present   . GERD (gastroesophageal reflux disease)   . Substance abuse   . Chronic renal insufficiency   . Syncope   . Thrombus 08/06/2010  . SYSTOLIC HEART FAILURE, CHRONIC 09/22/2008    Qualifier: Diagnosis of  By: [name], MD, Trixie DredgeFACC, Daniel R   . LV (left ventricular) mural thrombus 01/28/2011  . ICD - IN SITU 09/16/2008    Qualifier: Diagnosis of  By: Wonda AmisBednar, NP-C, Michelle    . MITRAL STENOSIS/ INSUFFICIENCY, NON-RHEUMATIC 09/22/2008    Qualifier: Diagnosis of  By: [name], MD, Trixie DredgeFACC, Daniel R   . Hepatomegaly 09/16/2008    Qualifier: Diagnosis of  By: Wonda Amis    . High cholesterol 02/26/2012    "at one time"  . Sleep apnea   . Exertional dyspnea 02/26/2012  . History of blood transfusion 08/2011    "when I had heart pump"  . Migraines   . COMMON MIGRAINE 06/14/2009    Qualifier: Diagnosis of  By: Jonny Ruiz MD, Len Blalock   . History of gout 02/26/2012  . Depression 08/11/2013    Pt denies  . Bipolar affective disorder 10/22/2011    pt denies this hx 02/26/2012    Current Outpatient Prescriptions  Medication Sig Dispense Refill  . aspirin EC 325 MG EC tablet Take 1 tablet (325 mg total) by  mouth daily.  30 tablet  6  . atorvastatin (LIPITOR) 40 MG tablet Take 1 tablet (40 mg total) by mouth daily.  30 tablet  3  . carvedilol (COREG) 3.125 MG tablet Take 1 tablet (3.125 mg total) by mouth 2 (two) times daily with a meal.  60 tablet  6  . clopidogrel (PLAVIX) 75 MG tablet Take 1 tablet (75 mg total) by mouth daily with breakfast.  30 tablet  6  . furosemide (LASIX) 40 MG tablet Take 40 mg daily; may take extra 40 mg for wt gain of 3 lbs/one day or 5 lbs/one week  60 tablet  6  . hydrALAZINE (APRESOLINE) 50 MG tablet Take 1 tablet (50 mg total) by mouth 3 (three) times daily.  90 tablet  6  . isosorbide mononitrate (IMDUR) 60 MG 24 hr tablet Take 1 tablet (60 mg total) by mouth daily.  30 tablet  6  . losartan (COZAAR) 50 MG tablet Take 1 tablet (50 mg total) by mouth 2 (two) times daily.  60 tablet  6  . potassium chloride SA (K-DUR,KLOR-CON) 20 MEQ tablet Take two tabs daily. May take one extra tab on days you take extra lasix  80 tablet  6  . tamsulosin (FLOMAX) 0.4 MG CAPS capsule Take 1 capsule (0.4 mg total) by mouth daily.  30 capsule  6  . warfarin (COUMADIN) 4 MG tablet Take one tab daily or as instructed for INR 2.0 - 3.0  60 tablet  6  . amiodarone (PACERONE) 200 MG tablet Take 1 tablet (200 mg total) by mouth daily.  60 tablet  6  . warfarin (COUMADIN) 3 MG tablet Take 3 mg by mouth daily. For INR 2.0 - 3.0       No current facility-administered medications for this encounter.   Facility-Administered Medications Ordered in Other Encounters  Medication Dose Route Frequency Provider Last Rate Last Dose  . sodium chloride 0.9 % injection 10-40 mL  10-40 mL Intracatheter PRN [name], MD        Ace inhibitors and Lexapro  REVIEW OF SYSTEMS: All systems negative except as listed in HPI, PMH and Problem list.   LVAD INTERROGATION:   HeartMate II LVAD: Flow 3.6 liters/min Speed 9390 Power 4.8 PI 5.1 PI events: 5 - 10 PI events; 10/4 - 20 PI events; 10/3 - 25  PI events Alarms: none Fixed speed: 9400 Low Speed: 8800 Primary Controller: Replace back up battery in 30 months (02/2016) [id number]; version V7.19  Back up controller: Replace back up battery in 22 months (08/2015) [id number]; version V7.23  I reviewed the LVAD parameters from today, and compared the results to the patient's prior recorded data. No changes to LVAD speed. The LVAD is functioning within specified parameters.  The patient performs LVAD  self-test daily.  LVAD interrogation was negative for any significant power changes, alarms or PI events/speed drops.  LVAD equipment check completed and is in good working order.  Back-up equipment present.   LVAD education done on emergency procedures and precautions and reviewed exit site care.    Filed Vitals:   03/31/14 1014  BP: 94/0  Pulse: 81  Height: 5\' 4"  (1.626 m)  Weight: 169 lb (76.658 kg)  SpO2: 100%    Physical Exam: GENERAL: Well appearing, male who presents to clinic today, friend present HEENT: normal  NECK: Supple, JVP flat, .2+ bilaterally, no bruits.  No lymphadenopathy or thyromegaly appreciated.   CARDIAC:  Mechanical heart sounds with LVAD hum present, prominent heart sounds LUNGS:  Clear to auscultation bilaterally.  ABDOMEN:  Soft, round, nontender, positive bowel sounds x4.     LVAD exit site: well-healed and incorporated.  No erythema or drainage.  Stabilization device present and accurately applied. Dressing being changed weekly. EXTREMITIES:  Warm and dry, no cyanosis, clubbing, rash, trace edema L>R NEUROLOGIC:  Alert and oriented x 4.  Gait steady.  No aphasia.  No dysarthria.  Affect pleasant.      ASSESSMENT AND PLAN:   1) Chronic systolic HF: NICM, s/p LVAD implant (08/2011) and subsequent (08/2013) - NYHA II symptoms and volume status stable. He maybe a little dry will change lasix to 20 mg daily and instructed if weight starts trending up to take 40 mg.  - MAP slightly elevated. Will continue current  medications and may titrate next time if still elevated.  -  Reinforced the need and importance of daily weights, a low sodium diet, and fluid restriction (less than 2 L a day). Instructed to call the HF clinic if weight increases more than 3 lbs overnight or 5 lbs in a week.  2) LVAD, s/p (08/2011) and then (08/2013) d/t pump thrombosis - LVAD parameters stable. - Labs reviewed and K+ critical 2.9. Patient reports he has not been taking his potassium. Will have him take 60 meq today and go back to 40 meq daily.  3) HTN - stable. As above continue current medications and stressed to patient to bring all medications to all visits.  4) Hemolysis - LDH stable. Will continue plavix, coumadin and ASA. No s/s of bleeding.  5) CVA - Stable.Continues to have left sided weakness. He is enrolled in OP PT/OT and told him to not miss appointments. Continue atorvastatin 40 mg daily.  6) Atrial Flutter: - Last EKG NSR. Patient has been out of Amiodarone. Will not restart currently and will continue to follow. Need to get him enrolled back in ICD clinic.  7) Anticoagulation management - INR Goal 2.0-3.0. INR SUPRA THERAPEUTIC 9.46. Patient has been dosing his own Coumadin. Have had another lengthy discussion with patient about the need to take his coumadin as directed and to not miss INR appointments. Have stressed the risks of stroke or bleeding with patient. Will hold coumadin until Sunday and then on Sunday take 2 mg. Recheck INR Monday. 8) RV failure - Fluid status stable. Continue low dose BB.  9) Social issues - Patient has lost Medicaid and CSW is working on helping him get reinstated. Patient has lots of difficulties with getting medications and taking correctly. Have asked Susie with EMS to try and help patient and she will start following him and helping him with medication reconciliation.   F/U 1 month [name] B NP-C 10:28 AM

## 2014-03-31 NOTE — Progress Notes (Signed)
Symptom  Yes  No  Details   Angina         x Activity:   Claudication         x How far:   Syncope         x When:  Dizziness with straining during BMs  Stroke        x    Orthopnea         x How many pillows: one  PND         x How often:  CPAP      N/A How many hrs:   Pedal edema                 x  left leg only  Abd fullness         x   N&V         x good appetite; eats full meals  Diaphoresis         x When:  Bleeding        x   Urine color    light yellow; foul odor  SOB         x  Activity:  incline  Palpitations         x When:  ICD shock         x   Hospitlizaitons                x When/where/why:    ED visit         x When/where/why:  Other MD         x When/who/why:  Activity          [facility] 2 x weekly  Fluid           No limitations   Diet     No limitations   Vital signs: HR:  81 MAP BP: 94 O2 Sat: 100 Wt: 169 lbs   Last wt: 169.2  lbs Ht: 5'4"  LVAD interrogation reveals:  Speed:  9390 Flow:  3.6  Power:  4.8 PI:  5.1 Alarms: none   Events:  5 - 10  PI events; 10/4 - 20 PI events; 10/3 - 25 PI events Fixed speed:  9400 Low speed limit: 8800  Primary Controller:  Replace back up battery in 30 months (02/2016)  [id number]; version V7.19 Back up controller:   Replace back up battery in  22 months (08/2015)  [id number]; version V7.23    LVAD exit site:  Well healed and incorporated. The velour is fully implanted at exit site. Pt doing his own dressing changes using Sorbaview dressing; stabilization device is not present.  Dressing supplies provided.  I reviewed the LVAD parameters from today, and compared the results to the patient's prior recorded data. The LVAD is functioning within specified parameters.  LVAD interrogation was negative for any significant power changes;  few PI events/speed drops present which is consistent with the patient's history.    Pt brought meds to clinic visit today as instructed; has not been taking  Amiodarone or K-Dur.   Lasandra BeechJackie Brennan, LSW in to speak with patient and friend Gabriel Rainwater(Tenesha) re: Medicaid, power bill, and overall financial status. Pt has "no minutes" on his phone; confirmed he received letter about last missed INR appt; he "threw it in trash".  Irish Lackenisha asked that she be contacted re: INR results, lab and clinic appts; pt verbalized permission for same.      LVAD  equipment check completed and is in good working order. Back-up equipment present.   Pt's gait remains unsteady. States he is going to [employer] 2 times weekly.  Pt states he is going for fitting today for his knee brace.

## 2014-03-31 NOTE — Patient Instructions (Addendum)
1.  Decrease Lasix to 20 mg daily 2.  Will call you with INR results and coumadin dose. 3.  Return to VAD clinic in one month. 4.  Take three 20 meq K-dur tabs today (your potassium level is low); then take two potassium tabs daily.

## 2014-03-31 NOTE — Addendum Note (Signed)
Addended by: Chyrl Civatte on: 03/31/2014 01:21 PM   Modules accepted: Orders

## 2014-04-01 ENCOUNTER — Ambulatory Visit: Payer: Medicare Other | Admitting: Occupational Therapy

## 2014-04-01 ENCOUNTER — Ambulatory Visit: Payer: Medicare Other | Attending: Internal Medicine

## 2014-04-01 DIAGNOSIS — Z9181 History of falling: Secondary | ICD-10-CM | POA: Insufficient documentation

## 2014-04-01 DIAGNOSIS — R262 Difficulty in walking, not elsewhere classified: Secondary | ICD-10-CM | POA: Diagnosis present

## 2014-04-01 DIAGNOSIS — R279 Unspecified lack of coordination: Secondary | ICD-10-CM | POA: Diagnosis not present

## 2014-04-01 DIAGNOSIS — I69398 Other sequelae of cerebral infarction: Secondary | ICD-10-CM | POA: Diagnosis present

## 2014-04-01 DIAGNOSIS — I5022 Chronic systolic (congestive) heart failure: Secondary | ICD-10-CM | POA: Diagnosis not present

## 2014-04-01 DIAGNOSIS — Z95810 Presence of automatic (implantable) cardiac defibrillator: Secondary | ICD-10-CM | POA: Diagnosis not present

## 2014-04-04 ENCOUNTER — Ambulatory Visit (HOSPITAL_COMMUNITY): Payer: Self-pay | Admitting: *Deleted

## 2014-04-04 ENCOUNTER — Other Ambulatory Visit (INDEPENDENT_AMBULATORY_CARE_PROVIDER_SITE_OTHER): Payer: Medicare Other | Admitting: *Deleted

## 2014-04-04 DIAGNOSIS — Z7901 Long term (current) use of anticoagulants: Secondary | ICD-10-CM

## 2014-04-04 DIAGNOSIS — Z95811 Presence of heart assist device: Secondary | ICD-10-CM

## 2014-04-04 LAB — BASIC METABOLIC PANEL
BUN: 10 mg/dL (ref 6–23)
CO2: 24 mEq/L (ref 19–32)
Calcium: 9.1 mg/dL (ref 8.4–10.5)
Chloride: 104 mEq/L (ref 96–112)
Creatinine, Ser: 1.1 mg/dL (ref 0.4–1.5)
GFR: 87.06 mL/min (ref >60.00)
Glucose, Bld: 80 mg/dL (ref 70–99)
POTASSIUM: 3.8 meq/L (ref 3.5–5.1)
Sodium: 138 mEq/L (ref 135–145)

## 2014-04-04 LAB — PROTIME-INR
INR: 4.5 ratio — ABNORMAL HIGH (ref 0.8–1.0)
Prothrombin Time: 48.3 s — ABNORMAL HIGH (ref 9.6–13.1)

## 2014-04-06 ENCOUNTER — Ambulatory Visit: Payer: Medicare Other | Admitting: Physical Therapy

## 2014-04-06 ENCOUNTER — Encounter: Payer: Medicare Other | Admitting: Occupational Therapy

## 2014-04-07 ENCOUNTER — Encounter: Payer: Self-pay | Admitting: Licensed Clinical Social Worker

## 2014-04-07 NOTE — Progress Notes (Signed)
CSW referred to assist with financial needs for payment of water bill. Patient moved into current housing on March 03, 2014 and has been unable to have the water bill placed in his name due to lack of finances. Patient informed by landlord that water will be shut off if ownership of bill is taken by patient. CSW assisted with resources for deposit/payment and confirmation received of bill placed in patient's name effective 04/08/2014. Patient very relieved and appreciative of assistance. CSW will continue to follow for support and encouragement around issues of maintaining independent living. Lasandra Beech, LCSW [phone]

## 2014-04-08 ENCOUNTER — Ambulatory Visit: Payer: Medicare Other | Admitting: Physical Therapy

## 2014-04-08 ENCOUNTER — Encounter: Payer: Medicare Other | Admitting: Occupational Therapy

## 2014-04-11 ENCOUNTER — Encounter: Payer: Medicare Other | Admitting: Occupational Therapy

## 2014-04-11 ENCOUNTER — Ambulatory Visit: Payer: Medicare Other | Admitting: Physical Therapy

## 2014-04-11 ENCOUNTER — Other Ambulatory Visit: Payer: Medicare Other

## 2014-04-11 NOTE — Telephone Encounter (Signed)
Open in error

## 2014-04-12 ENCOUNTER — Telehealth (HOSPITAL_COMMUNITY): Payer: Self-pay | Admitting: *Deleted

## 2014-04-12 ENCOUNTER — Telehealth (HOSPITAL_COMMUNITY): Payer: Self-pay | Admitting: Vascular Surgery

## 2014-04-12 NOTE — Telephone Encounter (Signed)
Called pt re: missed INR appointment yesterday; states he will "go tomorrow".

## 2014-04-12 NOTE — Telephone Encounter (Signed)
Pt called he would like to speak to [place] about him seeking employment.. Please advise

## 2014-04-13 ENCOUNTER — Other Ambulatory Visit (INDEPENDENT_AMBULATORY_CARE_PROVIDER_SITE_OTHER): Payer: Medicare Other | Admitting: *Deleted

## 2014-04-13 ENCOUNTER — Ambulatory Visit (HOSPITAL_COMMUNITY): Payer: Self-pay | Admitting: *Deleted

## 2014-04-13 DIAGNOSIS — Z95811 Presence of heart assist device: Secondary | ICD-10-CM

## 2014-04-13 DIAGNOSIS — Z7901 Long term (current) use of anticoagulants: Secondary | ICD-10-CM

## 2014-04-13 LAB — PROTIME-INR
INR: 4.8 ratio — ABNORMAL HIGH (ref 0.8–1.0)
Prothrombin Time: 51 s — ABNORMAL HIGH (ref 9.6–13.1)

## 2014-04-15 ENCOUNTER — Ambulatory Visit: Payer: Medicare Other

## 2014-04-15 ENCOUNTER — Encounter: Payer: Medicare Other | Admitting: Occupational Therapy

## 2014-04-20 ENCOUNTER — Other Ambulatory Visit: Payer: Medicare Other

## 2014-04-22 ENCOUNTER — Telehealth (HOSPITAL_COMMUNITY): Payer: Self-pay | Admitting: *Deleted

## 2014-04-22 NOTE — Telephone Encounter (Signed)
Called pt re: missed INR appt Wednesday; pt states car "isn't running"; will try to get INR checked Monday 04/25/14.

## 2014-04-25 NOTE — Addendum Note (Signed)
Encounter addended by: [name], NP on: 04/25/2014  4:57 PM<BR>     Documentation filed: Charges VN

## 2014-04-25 NOTE — Addendum Note (Signed)
Encounter addended by: [name], NP on: 04/25/2014  4:55 PM<BR>     Documentation filed: Follow-up Section, LOS Section, Visit Diagnoses

## 2014-04-28 ENCOUNTER — Ambulatory Visit (HOSPITAL_COMMUNITY): Payer: Self-pay | Admitting: *Deleted

## 2014-04-28 ENCOUNTER — Other Ambulatory Visit (INDEPENDENT_AMBULATORY_CARE_PROVIDER_SITE_OTHER): Payer: Medicare Other | Admitting: *Deleted

## 2014-04-28 DIAGNOSIS — Z7901 Long term (current) use of anticoagulants: Secondary | ICD-10-CM

## 2014-04-28 DIAGNOSIS — Z95811 Presence of heart assist device: Secondary | ICD-10-CM

## 2014-04-28 LAB — PROTIME-INR
INR: 4.2 ratio — ABNORMAL HIGH (ref 0.8–1.0)
PROTHROMBIN TIME: 44.9 s — AB (ref 9.6–13.1)

## 2014-05-02 ENCOUNTER — Encounter (HOSPITAL_COMMUNITY): Payer: Medicare Other

## 2014-05-02 ENCOUNTER — Other Ambulatory Visit (HOSPITAL_COMMUNITY): Payer: Self-pay | Admitting: *Deleted

## 2014-05-02 DIAGNOSIS — Z7901 Long term (current) use of anticoagulants: Secondary | ICD-10-CM

## 2014-05-02 DIAGNOSIS — I498 Other specified cardiac arrhythmias: Secondary | ICD-10-CM

## 2014-05-02 DIAGNOSIS — Z95811 Presence of heart assist device: Secondary | ICD-10-CM

## 2014-05-04 ENCOUNTER — Ambulatory Visit (HOSPITAL_COMMUNITY): Payer: Self-pay | Admitting: *Deleted

## 2014-05-04 ENCOUNTER — Other Ambulatory Visit (INDEPENDENT_AMBULATORY_CARE_PROVIDER_SITE_OTHER): Payer: Medicare Other | Admitting: *Deleted

## 2014-05-04 ENCOUNTER — Other Ambulatory Visit (HOSPITAL_COMMUNITY): Payer: Self-pay | Admitting: Cardiology

## 2014-05-04 DIAGNOSIS — I5022 Chronic systolic (congestive) heart failure: Secondary | ICD-10-CM

## 2014-05-04 DIAGNOSIS — I513 Intracardiac thrombosis, not elsewhere classified: Secondary | ICD-10-CM

## 2014-05-04 DIAGNOSIS — I10 Essential (primary) hypertension: Secondary | ICD-10-CM

## 2014-05-04 DIAGNOSIS — I213 ST elevation (STEMI) myocardial infarction of unspecified site: Secondary | ICD-10-CM

## 2014-05-04 DIAGNOSIS — R570 Cardiogenic shock: Secondary | ICD-10-CM

## 2014-05-04 DIAGNOSIS — Z5181 Encounter for therapeutic drug level monitoring: Secondary | ICD-10-CM

## 2014-05-04 DIAGNOSIS — I5023 Acute on chronic systolic (congestive) heart failure: Secondary | ICD-10-CM

## 2014-05-04 DIAGNOSIS — R0602 Shortness of breath: Secondary | ICD-10-CM

## 2014-05-04 LAB — BASIC METABOLIC PANEL
BUN: 12 mg/dL (ref 6–23)
CALCIUM: 9 mg/dL (ref 8.4–10.5)
CO2: 25 meq/L (ref 19–32)
CREATININE: 1.1 mg/dL (ref 0.4–1.5)
Chloride: 105 mEq/L (ref 96–112)
GFR: 93.63 mL/min (ref >60.00)
GLUCOSE: 95 mg/dL (ref 70–99)
Potassium: 3.3 mEq/L — ABNORMAL LOW (ref 3.5–5.1)
Sodium: 139 mEq/L (ref 135–145)

## 2014-05-04 LAB — PROTIME-INR
INR: 2.2 ratio — ABNORMAL HIGH (ref 0.8–1.0)
PROTHROMBIN TIME: 24.4 s — AB (ref 9.6–13.1)

## 2014-05-04 LAB — BRAIN NATRIURETIC PEPTIDE: Pro B Natriuretic peptide (BNP): 312 pg/mL — ABNORMAL HIGH (ref 0.0–100.0)

## 2014-05-04 LAB — LACTATE DEHYDROGENASE: LDH: 365 U/L — ABNORMAL HIGH (ref 94–250)

## 2014-05-09 ENCOUNTER — Ambulatory Visit (HOSPITAL_COMMUNITY): Payer: Self-pay | Admitting: Anesthesiology

## 2014-05-09 ENCOUNTER — Other Ambulatory Visit (HOSPITAL_COMMUNITY): Payer: Self-pay | Admitting: *Deleted

## 2014-05-09 ENCOUNTER — Ambulatory Visit (HOSPITAL_COMMUNITY)
Admission: RE | Admit: 2014-05-09 | Discharge: 2014-05-09 | Disposition: A | Discharge status: Home / Self Care | Payer: Medicare Other | Source: Ambulatory Visit | Attending: Cardiology | Admitting: Cardiology

## 2014-05-09 VITALS — BP 110/0 | HR 80 | Ht 64.0 in | Wt 171.4 lb

## 2014-05-09 DIAGNOSIS — Z79899 Other long term (current) drug therapy: Secondary | ICD-10-CM | POA: Insufficient documentation

## 2014-05-09 DIAGNOSIS — I4892 Unspecified atrial flutter: Secondary | ICD-10-CM | POA: Insufficient documentation

## 2014-05-09 DIAGNOSIS — Z7901 Long term (current) use of anticoagulants: Secondary | ICD-10-CM

## 2014-05-09 DIAGNOSIS — Z95810 Presence of automatic (implantable) cardiac defibrillator: Secondary | ICD-10-CM | POA: Diagnosis not present

## 2014-05-09 DIAGNOSIS — I10 Essential (primary) hypertension: Secondary | ICD-10-CM | POA: Diagnosis not present

## 2014-05-09 DIAGNOSIS — I340 Nonrheumatic mitral (valve) insufficiency: Secondary | ICD-10-CM | POA: Insufficient documentation

## 2014-05-09 DIAGNOSIS — R7402 Elevation of levels of lactic acid dehydrogenase (LDH): Secondary | ICD-10-CM

## 2014-05-09 DIAGNOSIS — Z9114 Patient's other noncompliance with medication regimen: Secondary | ICD-10-CM | POA: Insufficient documentation

## 2014-05-09 DIAGNOSIS — I429 Cardiomyopathy, unspecified: Secondary | ICD-10-CM | POA: Diagnosis not present

## 2014-05-09 DIAGNOSIS — R740 Nonspecific elevation of levels of transaminase and lactic acid dehydrogenase [LDH]: Secondary | ICD-10-CM

## 2014-05-09 DIAGNOSIS — Z7982 Long term (current) use of aspirin: Secondary | ICD-10-CM | POA: Diagnosis not present

## 2014-05-09 DIAGNOSIS — K219 Gastro-esophageal reflux disease without esophagitis: Secondary | ICD-10-CM | POA: Diagnosis not present

## 2014-05-09 DIAGNOSIS — I5023 Acute on chronic systolic (congestive) heart failure: Secondary | ICD-10-CM

## 2014-05-09 DIAGNOSIS — Z95811 Presence of heart assist device: Secondary | ICD-10-CM

## 2014-05-09 DIAGNOSIS — T82897D Other specified complication of cardiac prosthetic devices, implants and grafts, subsequent encounter: Secondary | ICD-10-CM | POA: Insufficient documentation

## 2014-05-09 DIAGNOSIS — I483 Typical atrial flutter: Secondary | ICD-10-CM

## 2014-05-09 DIAGNOSIS — I159 Secondary hypertension, unspecified: Secondary | ICD-10-CM

## 2014-05-09 DIAGNOSIS — N189 Chronic kidney disease, unspecified: Secondary | ICD-10-CM | POA: Insufficient documentation

## 2014-05-09 DIAGNOSIS — I5022 Chronic systolic (congestive) heart failure: Secondary | ICD-10-CM

## 2014-05-09 DIAGNOSIS — I69954 Hemiplegia and hemiparesis following unspecified cerebrovascular disease affecting left non-dominant side: Secondary | ICD-10-CM | POA: Insufficient documentation

## 2014-05-09 DIAGNOSIS — I6312 Cerebral infarction due to embolism of basilar artery: Secondary | ICD-10-CM

## 2014-05-09 DIAGNOSIS — I513 Intracardiac thrombosis, not elsewhere classified: Secondary | ICD-10-CM

## 2014-05-09 DIAGNOSIS — I498 Other specified cardiac arrhythmias: Secondary | ICD-10-CM

## 2014-05-09 DIAGNOSIS — E876 Hypokalemia: Secondary | ICD-10-CM

## 2014-05-09 LAB — CBC
HEMATOCRIT: 38.6 % — AB (ref 39.0–52.0)
HEMOGLOBIN: 12.3 g/dL — AB (ref 13.0–17.0)
MCH: 23.8 pg — AB (ref 26.0–34.0)
MCHC: 31.9 g/dL (ref 30.0–36.0)
MCV: 74.8 fL — AB (ref 78.0–100.0)
Platelets: 261 10*3/uL (ref 150–400)
RBC: 5.16 MIL/uL (ref 4.22–5.81)
RDW: 20.4 % — ABNORMAL HIGH (ref 11.5–15.5)
WBC: 2.3 10*3/uL — ABNORMAL LOW (ref 4.0–10.5)

## 2014-05-09 LAB — BASIC METABOLIC PANEL
Anion gap: 13 (ref 5–15)
BUN: 9 mg/dL (ref 6–23)
CO2: 25 meq/L (ref 19–32)
CREATININE: 1.07 mg/dL (ref 0.50–1.35)
Calcium: 9.4 mg/dL (ref 8.4–10.5)
Chloride: 105 mEq/L (ref 96–112)
GFR calc Af Amer: >90 mL/min (ref >90)
GFR calc non Af Amer: 79 mL/min — ABNORMAL LOW (ref >90)
GLUCOSE: 92 mg/dL (ref 70–99)
Potassium: 3.5 mEq/L — ABNORMAL LOW (ref 3.7–5.3)
Sodium: 143 mEq/L (ref 137–147)

## 2014-05-09 LAB — PROTIME-INR
INR: 2.28 — ABNORMAL HIGH (ref 0.00–1.49)
Prothrombin Time: 25.3 seconds — ABNORMAL HIGH (ref 11.6–15.2)

## 2014-05-09 LAB — LACTATE DEHYDROGENASE: LDH: 520 U/L — ABNORMAL HIGH (ref 94–250)

## 2014-05-09 LAB — PRO B NATRIURETIC PEPTIDE: PRO B NATRI PEPTIDE: 1914 pg/mL — AB (ref 0–125)

## 2014-05-09 MED ORDER — CLOPIDOGREL BISULFATE 75 MG PO TABS: 75.0000 mg | ORAL_TABLET | Freq: Every day | ORAL | Status: DC

## 2014-05-09 MED ORDER — WARFARIN SODIUM 4 MG PO TABS: ORAL_TABLET | ORAL | Status: DC

## 2014-05-09 MED ORDER — TAMSULOSIN HCL 0.4 MG PO CAPS: 0.4000 mg | ORAL_CAPSULE | Freq: Every day | ORAL | Status: DC

## 2014-05-09 MED ORDER — ASPIRIN 325 MG PO TBEC: 325.0000 mg | DELAYED_RELEASE_TABLET | Freq: Every day | ORAL | Status: DC

## 2014-05-09 MED ORDER — CARVEDILOL 3.125 MG PO TABS: 3.1250 mg | ORAL_TABLET | Freq: Two times a day (BID) | ORAL | Status: DC

## 2014-05-09 MED ORDER — ATORVASTATIN CALCIUM 40 MG PO TABS: 40.0000 mg | ORAL_TABLET | Freq: Every day | ORAL | Status: DC

## 2014-05-09 MED ORDER — HYDRALAZINE HCL 50 MG PO TABS: 50.0000 mg | ORAL_TABLET | Freq: Three times a day (TID) | ORAL | Status: DC

## 2014-05-09 MED ORDER — FUROSEMIDE 20 MG PO TABS: 20.0000 mg | ORAL_TABLET | Freq: Every day | ORAL | Status: DC

## 2014-05-09 MED ORDER — ISOSORBIDE MONONITRATE ER 60 MG PO TB24: 60.0000 mg | ORAL_TABLET | Freq: Every day | ORAL | Status: DC

## 2014-05-09 MED ORDER — POTASSIUM CHLORIDE CRYS ER 20 MEQ PO TBCR: EXTENDED_RELEASE_TABLET | ORAL | Status: DC

## 2014-05-09 MED ORDER — LOSARTAN POTASSIUM 50 MG PO TABS: 50.0000 mg | ORAL_TABLET | Freq: Two times a day (BID) | ORAL | Status: DC

## 2014-05-09 NOTE — Patient Instructions (Signed)
Take 2 mg daily and repeat in 2 weeks.

## 2014-05-09 NOTE — Progress Notes (Signed)
Symptom  Yes  No  Details   Angina         x Activity:   Claudication         x How far:   Syncope         x When:   Stroke        x    Orthopnea         x How many pillows: one  PND         x How often:  CPAP      N/A How many hrs:   Pedal edema                 x  left leg only  Abd fullness         x   N&V         x good appetite; eats full meals  Diaphoresis         x When:  Bleeding        x   Urine color    light yellow  SOB         x Activity:  incline  Palpitations         x When:  ICD shock         x   Hospitlizaitons                x When/where/why:    ED visit         x When/where/why:  Other MD         x When/who/why:  Activity           Watching TV  Fluid           No limitations   Diet     No limitations   Vital signs: HR:  80 MAP BP: 110 O2 Sat: 99 Wt: 171.4  lbs   Last wt: 169  lbs Ht: 5'4"  LVAD interrogation reveals:  Speed:  9380 Flow:  --- Power:  4.7 PI:  6.0 Alarms: low voltage advisories   Events:  0 - 10 PI events Fixed speed:  9400 Low speed limit: 8800  Primary Controller:  Replace back up battery in 30 months (03/2016)  [id number]; version V7.23 Back up controller:   Replace back up battery in  22 months (08/2015)  [id number]; version V7.23    LVAD exit site:  Well healed and incorporated. The velour is fully implanted at exit site. Pt doing his own dressing changes using Sorbaview dressing; stabilization device is not present.  Dressing supplies provided.  I reviewed the LVAD parameters from today, and compared the results to the patient's prior recorded data. The LVAD is functioning within specified parameters.  LVAD interrogation was negative for any significant power changes;  few PI events/speed drops present which is consistent with the patient's history.        LVAD equipment check completed and is in good working order. Back-up equipment present.   Changed primary controller from V7.19 to V7.23 [id number] per [employer]  recommendation. His current back up controller is V7.23 [id number]. Both controllers programmed to fixed speed 9400 with low speed limit 8800.

## 2014-05-09 NOTE — Progress Notes (Signed)
Patient ID: [name], male   DOB: 1960/10/13, 53 y.o.   MRN: [id number]  Primary Physician: Dr. Oliver Barre  Primary Cardiologist: Dr. 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   He was admitted to [facility] January 2015 with NSTEMI and RV failure. He had a difficult course that was complicated by embolic right CVA. He was intubated and eventually had a tracheostomy placed 07/28/13. He was transferred to inpatient rehab for extensive rehab and was making progression, however despite aggressive therapy with heparin, coumadin, ASA and Plavix, his LDH remained persistently elevated in the 950 range (was about 1750 on admit). He did not experience any pump dysfunction, however the case was discussed with Dr Allena Katz and [name] at [facility] and it was felt to transfer him down there for possible pump exchange. He underwent a pump exchange 08/31/13. Post op was complicated by bleeding and he received multiple transfusions. He had AKI and had intermittent HD and underwent last HD on 09/24/13. Last Cr on chart 1.9 (10/02/13)  Admitted 5/13-5/18/15 for volume overload and possible hemolysis. LDH 647 and INR 1.69 on admission. Treated with IV lasix and IV heparin. While in the hospital developed some PI events and speed was decreased to 9400. ICD interrogated and showed A flutter and he was placed on IV amiodarone and then transitioned to 200 mg PO BID. He cardioverted to NSR on 5/15. LDH trended down and on discharge was 530 and INR 2.4.  Follow up for Heart Failure and LVAD: Last visit patient was a little dry and changed lasix to 20 mg daily, however has been taking 40 mg daily. Patient thinks he has been out of potassium. Last visit INR supratherapeutic and told to hold coumadin for a couple days and cut back. Denies SOB, orthopnea or CP. Having L LE edema. No dizziness. Weight at  home 165-170 lbs. No falls since last visit. Following a low salt diet and drinking less than 2L a day.   SH: Disabled, lives at home with caregiver. No ETOH or smoking FH: Mother living; HTN        Father living; no health issues   No alarms.  Denies driveline trauma, erythema or drainage.  Denies ICD shocks.   Reports taking Coumadin as prescribed and adherence to anticoagulation based dietary restrictions.  Denies bright red blood per rectum or melena, no dark urine or hematuria.     Past Medical History  Diagnosis Date  . CHF (congestive heart failure)     EF- 10-15  . Medically noncompliant   . Mitral regurgitation   . Tobacco user   . HTN (hypertension)   . AICD (automatic cardioverter/defibrillator) present   . GERD (gastroesophageal reflux disease)   . Substance abuse   . Chronic renal insufficiency   . Syncope   . Thrombus 08/06/2010  . SYSTOLIC HEART FAILURE, CHRONIC 09/22/2008    Qualifier: Diagnosis of  By: Gala Romney, MD, Trixie Dredge   . LV (left ventricular) mural thrombus 01/28/2011  . ICD - IN SITU 09/16/2008    Qualifier: Diagnosis of  By: Wonda Amis    . MITRAL STENOSIS/ INSUFFICIENCY, NON-RHEUMATIC 09/22/2008    Qualifier: Diagnosis of  By: Gala Romney, MD, Trixie Dredge Hepatomegaly 09/16/2008    Qualifier: Diagnosis of  By: Wonda Amis    . High  cholesterol 02/26/2012    "at one time"  . Sleep apnea   . Exertional dyspnea 02/26/2012  . History of blood transfusion 08/2011    "when I had heart pump"  . Migraines   . COMMON MIGRAINE 06/14/2009    Qualifier: Diagnosis of  By: Jonny Ruiz MD, Len Blalock   . History of gout 02/26/2012  . Depression 08/11/2013    Pt denies  . Bipolar affective disorder 10/22/2011    pt denies this hx 02/26/2012    Current Outpatient Prescriptions  Medication Sig Dispense Refill  . aspirin EC 325 MG EC tablet Take 1 tablet (325 mg total) by mouth daily. 30 tablet 6  . atorvastatin (LIPITOR) 40 MG tablet Take 1 tablet  (40 mg total) by mouth daily. 30 tablet 3  . carvedilol (COREG) 3.125 MG tablet Take 1 tablet (3.125 mg total) by mouth 2 (two) times daily with a meal. 60 tablet 6  . clopidogrel (PLAVIX) 75 MG tablet Take 1 tablet (75 mg total) by mouth daily with breakfast. 30 tablet 6  . furosemide (LASIX) 20 MG tablet Take 1 tablet (20 mg total) by mouth daily. Take 40 mg daily; may take extra 40 mg for wt gain of 3 lbs/one day or 5 lbs/one week 60 tablet 6  . hydrALAZINE (APRESOLINE) 50 MG tablet Take 1 tablet (50 mg total) by mouth 3 (three) times daily. 90 tablet 6  . isosorbide mononitrate (IMDUR) 60 MG 24 hr tablet Take 1 tablet (60 mg total) by mouth daily. 30 tablet 6  . losartan (COZAAR) 50 MG tablet Take 1 tablet (50 mg total) by mouth 2 (two) times daily. 60 tablet 6  . tamsulosin (FLOMAX) 0.4 MG CAPS capsule Take 1 capsule (0.4 mg total) by mouth daily. 30 capsule 6  . warfarin (COUMADIN) 4 MG tablet Take one tab daily or as instructed for INR 2.0 - 3.0 60 tablet 6  . potassium chloride SA (K-DUR,KLOR-CON) 20 MEQ tablet Take two tabs daily. May take one extra tab on days you take extra lasix 80 tablet 6  . warfarin (COUMADIN) 3 MG tablet Take 3 mg by mouth daily. For INR 2.0 - 3.0     No current facility-administered medications for this encounter.   Facility-Administered Medications Ordered in Other Encounters  Medication Dose Route Frequency Provider Last Rate Last Dose  . sodium chloride 0.9 % injection 10-40 mL  10-40 mL Intracatheter PRN [name], MD        Ace inhibitors and Lexapro  REVIEW OF SYSTEMS: All systems negative except as listed in HPI, PMH and Problem list.   LVAD INTERROGATION:   HeartMate II LVAD: Flow --- liters/min Speed 9390 Power 4.7 PI 6.0 PI events: 0-10 PI events Alarms: low voltage advisories  Fixed speed: 9400 Low Speed: 8800 Primary Controller: Replace back up battery in 22 months (08/2015) PC [id number]; version V7.23 Back up controller: Replace  back up battery in 22 months (08/2015) PC [id number]; version V7.23  I reviewed the LVAD parameters from today, and compared the results to the patient's prior recorded data. No changes to LVAD speed. The LVAD is functioning within specified parameters.  The patient performs LVAD self-test daily.  LVAD interrogation was negative for any significant power changes, alarms or PI events/speed drops.  LVAD equipment check completed and is in good working order.  Back-up equipment present.   LVAD education done on emergency procedures and precautions and reviewed exit site care.    Filed Vitals:  05/09/14 1122  BP: 110/0  Pulse: 80  Height: 5\' 4"  (1.626 m)  Weight: 171 lb 6.4 oz (77.747 kg)  SpO2: 99%    Physical Exam: GENERAL: Well appearing, male who presents to clinic today, friend present and mom present HEENT: normal  NECK: Supple, JVP flat, .2+ bilaterally, no bruits.  No lymphadenopathy or thyromegaly appreciated.   CARDIAC:  Mechanical heart sounds with LVAD hum present, prominent heart sounds LUNGS:  Clear to auscultation bilaterally.  ABDOMEN:  Soft, round, nontender, positive bowel sounds x4.     LVAD exit site: well-healed and incorporated.  No erythema or drainage.  Stabilization device present and accurately applied. Dressing being changed weekly. EXTREMITIES:  Warm and dry, no cyanosis, clubbing, rash, trace edema L>R NEUROLOGIC:  Alert and oriented x 4.  Gait steady.  No aphasia.  No dysarthria.  Affect pleasant.      ASSESSMENT AND PLAN:   1) Chronic systolic HF: NICM, s/p LVAD implant (08/2011) and subsequent (08/2013) - NYHA II symptoms and volume status stable. Have asked him to please cut lasix back to 20 mg daily.  - MAP elevated. Patient has not taken medications today so will not titrate anything and have asked for him to please take before next visit.  - Continue coreg 3.125 mg BID, hydralazine 50 mg TID, Imdur 60 mg daily and losartan 50 mg BID.  -  Reinforced the need  and importance of daily weights, a low sodium diet, and fluid restriction (less than 2 L a day). Instructed to call the HF clinic if weight increases more than 3 lbs overnight or 5 lbs in a week.  2) LVAD, s/p (08/2011) and then (08/2013) d/t pump thrombosis - LVAD parameters stable for patient. Pump flow running in the low 3s and --- - Check LDH, CBC, BMET, pro-BNP and INR today. - Not a tx candidate d.t compliance issues.  3) HTN - elevated. As above have asked him to please take medications before next visit and bring all medications.  4) Hemolysis - Check LDH. Continue Plavix, coumadin and ASA. No s/s of bleeding.  5) CVA - Stable.Continues to have left sided weakness. No longer in therapy. Continue atorvastatin 40 mg daily.  6) Atrial Flutter: - history; Last EKG NSR  Need to get him enrolled back in ICD clinic.  7) Anticoagulation management - INR Goal 2.5-3.0. He has had multiple supra theraeputic INRs and has missed multiple INR appointments. Will check INR today and adjust accordingly. Have discussed again the risks of too high or low INR.  8) RV failure - Fluid status stable. Continue low dose BB.    F/U 2 months Ulla Potashosgrove, Hensley Treat B NP-C 11:31 AM

## 2014-05-09 NOTE — Patient Instructions (Addendum)
Doing well.  Cut back lasix to 20 mg daily.  Pick up the phone later today about INR.  Call any issues.  F/U 2 months  Do the following things EVERYDAY: 1) Weigh yourself in the morning before breakfast. Write it down and keep it in a log. 2) Take your medicines as prescribed 3) Eat low salt foods-Limit salt (sodium) to 2000 mg per day.  4) Stay as active as you can everyday 5) Limit all fluids for the day to less than 2 liters 6)

## 2014-05-23 ENCOUNTER — Telehealth (HOSPITAL_COMMUNITY): Payer: Self-pay | Admitting: *Deleted

## 2014-05-23 ENCOUNTER — Other Ambulatory Visit: Payer: Medicare Other

## 2014-05-23 NOTE — Telephone Encounter (Signed)
Pt called VAD coordinator and left message he was having "red heart alarms" and he didn't know what to do. Called his cell and left message; called Irish Lack (friend) and she was with patient. Irish Lack reports he is having red heart low flow alarms; subsided after drinking some fluid, but then started again. Instructed her and patient to come to ED for evaluations; patient states he "can't come". Asked him to call 911 for transportation, but patient states he "feels fine now". States the VAD alarms occurred over the weekend, and he isn't having any now; states he feels fine with current VAD parameters:  Flow 4.0, PI 5.6, Power 5.1, speed 9400.  Pt missed INR and BMP check today; states he re-scheduled for Thursday. Asked him to come to clinic tomorrow, "can't come if it is raining".  Instructed him to call VAD pager if any further red heart alarms; pt verbalized agreement to do so.

## 2014-05-24 NOTE — Telephone Encounter (Signed)
Opened in error

## 2014-05-26 ENCOUNTER — Other Ambulatory Visit (INDEPENDENT_AMBULATORY_CARE_PROVIDER_SITE_OTHER): Payer: Medicare Other | Admitting: *Deleted

## 2014-05-26 ENCOUNTER — Telehealth (HOSPITAL_COMMUNITY): Payer: Self-pay | Admitting: *Deleted

## 2014-05-26 ENCOUNTER — Ambulatory Visit (HOSPITAL_COMMUNITY): Payer: Self-pay | Admitting: *Deleted

## 2014-05-26 DIAGNOSIS — Z95811 Presence of heart assist device: Secondary | ICD-10-CM

## 2014-05-26 DIAGNOSIS — E876 Hypokalemia: Secondary | ICD-10-CM

## 2014-05-26 DIAGNOSIS — Z7901 Long term (current) use of anticoagulants: Secondary | ICD-10-CM

## 2014-05-26 LAB — BASIC METABOLIC PANEL
BUN: 10 mg/dL (ref 6–23)
CHLORIDE: 103 meq/L (ref 96–112)
CO2: 26 mEq/L (ref 19–32)
Calcium: 8.4 mg/dL (ref 8.4–10.5)
Creatinine, Ser: 1 mg/dL (ref 0.4–1.5)
GFR: 107.38 mL/min (ref >60.00)
Glucose, Bld: 91 mg/dL (ref 70–99)
POTASSIUM: 2.8 meq/L — AB (ref 3.5–5.1)
SODIUM: 139 meq/L (ref 135–145)

## 2014-05-26 LAB — PROTIME-INR
INR: 1.8 ratio — AB (ref 0.8–1.0)
PROTHROMBIN TIME: 20.2 s — AB (ref 9.6–13.1)

## 2014-05-26 MED ORDER — POTASSIUM CHLORIDE CRYS ER 20 MEQ PO TBCR: EXTENDED_RELEASE_TABLET | ORAL | Status: DC

## 2014-05-26 NOTE — Telephone Encounter (Signed)
Received call from lab K 2.8, pt has been out of K and has not been taking, per [name], NP have pt take 60 meq (3 tabs) today and then 40 meq (2 tabs) daily, pt aware and agreeable, he states he will pick up medication today

## 2014-05-30 ENCOUNTER — Other Ambulatory Visit: Payer: Medicare Other

## 2014-05-30 ENCOUNTER — Other Ambulatory Visit (HOSPITAL_COMMUNITY): Payer: Self-pay | Admitting: Cardiology

## 2014-05-30 DIAGNOSIS — I5022 Chronic systolic (congestive) heart failure: Secondary | ICD-10-CM

## 2014-06-01 ENCOUNTER — Telehealth (HOSPITAL_COMMUNITY): Payer: Self-pay | Admitting: *Deleted

## 2014-06-01 NOTE — Telephone Encounter (Signed)
Called patient re: missed INR appt 05/30/14; both patient and [name] said he went to lab Monday and was told "it wasn't time" for lab check.  Confirmed lab request and appt for 05/30/14; states he will go back tomorrow, "too busy today".  Asked him to call VAD coordinator or come by VAD clinic if any problems in getting labs drawn in the future; pt verbalized understanding of same.

## 2014-06-02 ENCOUNTER — Encounter (HOSPITAL_COMMUNITY): Payer: Self-pay | Admitting: Internal Medicine

## 2014-06-02 ENCOUNTER — Other Ambulatory Visit (INDEPENDENT_AMBULATORY_CARE_PROVIDER_SITE_OTHER): Payer: Medicare Other | Admitting: *Deleted

## 2014-06-02 ENCOUNTER — Ambulatory Visit (HOSPITAL_COMMUNITY): Payer: Self-pay | Admitting: *Deleted

## 2014-06-02 DIAGNOSIS — I5022 Chronic systolic (congestive) heart failure: Secondary | ICD-10-CM

## 2014-06-02 DIAGNOSIS — Z5181 Encounter for therapeutic drug level monitoring: Secondary | ICD-10-CM

## 2014-06-02 DIAGNOSIS — Z95811 Presence of heart assist device: Secondary | ICD-10-CM

## 2014-06-02 DIAGNOSIS — Z7901 Long term (current) use of anticoagulants: Secondary | ICD-10-CM

## 2014-06-02 LAB — PROTIME-INR
INR: 1.6 ratio — ABNORMAL HIGH (ref 0.8–1.0)
PROTHROMBIN TIME: 17.8 s — AB (ref 9.6–13.1)

## 2014-06-02 LAB — BASIC METABOLIC PANEL
BUN: 11 mg/dL (ref 6–23)
CHLORIDE: 105 meq/L (ref 96–112)
CO2: 24 meq/L (ref 19–32)
Calcium: 8.7 mg/dL (ref 8.4–10.5)
Creatinine, Ser: 1 mg/dL (ref 0.4–1.5)
GFR: 96.73 mL/min (ref >60.00)
Glucose, Bld: 73 mg/dL (ref 70–99)
POTASSIUM: 3.6 meq/L (ref 3.5–5.1)
SODIUM: 136 meq/L (ref 135–145)

## 2014-06-02 MED ORDER — ENOXAPARIN SODIUM 80 MG/0.8ML ~~LOC~~ SOLN: 80.0000 mg | Freq: Two times a day (BID) | SUBCUTANEOUS | Status: DC

## 2014-06-06 ENCOUNTER — Ambulatory Visit (HOSPITAL_COMMUNITY): Payer: Self-pay | Admitting: *Deleted

## 2014-06-06 ENCOUNTER — Other Ambulatory Visit (INDEPENDENT_AMBULATORY_CARE_PROVIDER_SITE_OTHER): Payer: Medicare Other | Admitting: *Deleted

## 2014-06-06 DIAGNOSIS — Z95811 Presence of heart assist device: Secondary | ICD-10-CM

## 2014-06-06 DIAGNOSIS — Z5181 Encounter for therapeutic drug level monitoring: Secondary | ICD-10-CM

## 2014-06-06 DIAGNOSIS — Z7901 Long term (current) use of anticoagulants: Secondary | ICD-10-CM

## 2014-06-06 LAB — PROTIME-INR
INR: 1.5 ratio — AB (ref 0.8–1.0)
PROTHROMBIN TIME: 16.4 s — AB (ref 9.6–13.1)

## 2014-06-08 ENCOUNTER — Other Ambulatory Visit (INDEPENDENT_AMBULATORY_CARE_PROVIDER_SITE_OTHER): Payer: Medicare Other

## 2014-06-08 DIAGNOSIS — Z7901 Long term (current) use of anticoagulants: Secondary | ICD-10-CM

## 2014-06-08 DIAGNOSIS — Z95811 Presence of heart assist device: Secondary | ICD-10-CM

## 2014-06-08 LAB — PROTIME-INR
INR: 1.5 ratio — ABNORMAL HIGH (ref 0.8–1.0)
Prothrombin Time: 16.8 s — ABNORMAL HIGH (ref 9.6–13.1)

## 2014-06-09 ENCOUNTER — Other Ambulatory Visit: Payer: Medicare Other

## 2014-06-09 ENCOUNTER — Ambulatory Visit (HOSPITAL_COMMUNITY): Payer: Self-pay | Admitting: *Deleted

## 2014-06-09 DIAGNOSIS — Z5181 Encounter for therapeutic drug level monitoring: Secondary | ICD-10-CM

## 2014-06-09 DIAGNOSIS — Z7901 Long term (current) use of anticoagulants: Secondary | ICD-10-CM

## 2014-06-09 DIAGNOSIS — Z95811 Presence of heart assist device: Secondary | ICD-10-CM

## 2014-06-09 MED ORDER — ENOXAPARIN SODIUM 80 MG/0.8ML ~~LOC~~ SOLN: 80.0000 mg | Freq: Two times a day (BID) | SUBCUTANEOUS | Status: DC

## 2014-06-12 ENCOUNTER — Other Ambulatory Visit (HOSPITAL_COMMUNITY): Payer: Self-pay | Admitting: *Deleted

## 2014-06-12 ENCOUNTER — Telehealth (HOSPITAL_COMMUNITY): Payer: Self-pay | Admitting: *Deleted

## 2014-06-12 DIAGNOSIS — I5023 Acute on chronic systolic (congestive) heart failure: Secondary | ICD-10-CM

## 2014-06-12 DIAGNOSIS — Z95811 Presence of heart assist device: Secondary | ICD-10-CM

## 2014-06-12 DIAGNOSIS — I159 Secondary hypertension, unspecified: Secondary | ICD-10-CM

## 2014-06-12 DIAGNOSIS — Z7901 Long term (current) use of anticoagulants: Secondary | ICD-10-CM

## 2014-06-12 MED ORDER — FUROSEMIDE 20 MG PO TABS: 20.0000 mg | ORAL_TABLET | Freq: Every day | ORAL | Status: DC

## 2014-06-12 NOTE — Telephone Encounter (Signed)
Irish Lack called VAD office and left message asking for refills to be called in for K-Dur and Lasix. Called Sylvia and explained patient has refills on both; he needs to call pharmacy and asked for refills. She verbalized understanding of same.

## 2014-06-13 ENCOUNTER — Ambulatory Visit: Payer: Self-pay | Admitting: *Deleted

## 2014-06-13 ENCOUNTER — Other Ambulatory Visit (INDEPENDENT_AMBULATORY_CARE_PROVIDER_SITE_OTHER): Payer: Medicare Other | Admitting: *Deleted

## 2014-06-13 DIAGNOSIS — Z7901 Long term (current) use of anticoagulants: Secondary | ICD-10-CM

## 2014-06-13 DIAGNOSIS — I5022 Chronic systolic (congestive) heart failure: Secondary | ICD-10-CM

## 2014-06-13 DIAGNOSIS — Z95811 Presence of heart assist device: Secondary | ICD-10-CM

## 2014-06-13 LAB — PROTIME-INR
INR: 2.8 ratio — AB (ref 0.8–1.0)
Prothrombin Time: 30.5 s — ABNORMAL HIGH (ref 9.6–13.1)

## 2014-06-20 ENCOUNTER — Other Ambulatory Visit: Payer: Medicare Other

## 2014-06-22 ENCOUNTER — Telehealth (HOSPITAL_COMMUNITY): Payer: Self-pay | Admitting: Infectious Diseases

## 2014-06-22 NOTE — Telephone Encounter (Signed)
INR scheduled 06/20/14; patient did not show. Called him to remind him of the appointment of which he responded that it was "raining outside and he will do it after the New Year." Rescheduled for Monday 06/27/13.

## 2014-06-27 ENCOUNTER — Other Ambulatory Visit: Payer: Medicare Other

## 2014-07-05 ENCOUNTER — Inpatient Hospital Stay (HOSPITAL_COMMUNITY)
Admission: EM | Admit: 2014-07-05 | Discharge: 2014-07-06 | DRG: 641 | Disposition: A | Discharge status: Home / Self Care | Payer: Medicare Other | Attending: Internal Medicine | Admitting: Internal Medicine

## 2014-07-05 ENCOUNTER — Encounter (HOSPITAL_COMMUNITY): Payer: Self-pay | Admitting: Emergency Medicine

## 2014-07-05 ENCOUNTER — Telehealth (HOSPITAL_COMMUNITY): Payer: Self-pay | Admitting: *Deleted

## 2014-07-05 ENCOUNTER — Emergency Department (HOSPITAL_COMMUNITY): Payer: Medicare Other

## 2014-07-05 DIAGNOSIS — Z79899 Other long term (current) drug therapy: Secondary | ICD-10-CM | POA: Diagnosis not present

## 2014-07-05 DIAGNOSIS — E780 Pure hypercholesterolemia: Secondary | ICD-10-CM | POA: Diagnosis present

## 2014-07-05 DIAGNOSIS — I4892 Unspecified atrial flutter: Secondary | ICD-10-CM | POA: Diagnosis present

## 2014-07-05 DIAGNOSIS — Z8673 Personal history of transient ischemic attack (TIA), and cerebral infarction without residual deficits: Secondary | ICD-10-CM | POA: Diagnosis not present

## 2014-07-05 DIAGNOSIS — I129 Hypertensive chronic kidney disease with stage 1 through stage 4 chronic kidney disease, or unspecified chronic kidney disease: Secondary | ICD-10-CM | POA: Diagnosis present

## 2014-07-05 DIAGNOSIS — Z95810 Presence of automatic (implantable) cardiac defibrillator: Secondary | ICD-10-CM | POA: Diagnosis not present

## 2014-07-05 DIAGNOSIS — I059 Rheumatic mitral valve disease, unspecified: Secondary | ICD-10-CM

## 2014-07-05 DIAGNOSIS — E861 Hypovolemia: Secondary | ICD-10-CM | POA: Diagnosis present

## 2014-07-05 DIAGNOSIS — G4730 Sleep apnea, unspecified: Secondary | ICD-10-CM | POA: Diagnosis present

## 2014-07-05 DIAGNOSIS — Z7901 Long term (current) use of anticoagulants: Secondary | ICD-10-CM | POA: Diagnosis not present

## 2014-07-05 DIAGNOSIS — Z7982 Long term (current) use of aspirin: Secondary | ICD-10-CM

## 2014-07-05 DIAGNOSIS — I252 Old myocardial infarction: Secondary | ICD-10-CM

## 2014-07-05 DIAGNOSIS — Z95811 Presence of heart assist device: Secondary | ICD-10-CM

## 2014-07-05 DIAGNOSIS — E869 Volume depletion, unspecified: Secondary | ICD-10-CM | POA: Diagnosis present

## 2014-07-05 DIAGNOSIS — I159 Secondary hypertension, unspecified: Secondary | ICD-10-CM

## 2014-07-05 DIAGNOSIS — I5022 Chronic systolic (congestive) heart failure: Secondary | ICD-10-CM | POA: Diagnosis present

## 2014-07-05 DIAGNOSIS — Z789 Other specified health status: Secondary | ICD-10-CM | POA: Diagnosis present

## 2014-07-05 DIAGNOSIS — Z7902 Long term (current) use of antithrombotics/antiplatelets: Secondary | ICD-10-CM | POA: Diagnosis not present

## 2014-07-05 DIAGNOSIS — I5023 Acute on chronic systolic (congestive) heart failure: Secondary | ICD-10-CM

## 2014-07-05 DIAGNOSIS — T829XXA Unspecified complication of cardiac and vascular prosthetic device, implant and graft, initial encounter: Secondary | ICD-10-CM

## 2014-07-05 DIAGNOSIS — N183 Chronic kidney disease, stage 3 (moderate): Secondary | ICD-10-CM | POA: Diagnosis present

## 2014-07-05 DIAGNOSIS — Z87891 Personal history of nicotine dependence: Secondary | ICD-10-CM | POA: Diagnosis not present

## 2014-07-05 LAB — CBC WITH DIFFERENTIAL/PLATELET
BASOS ABS: 0 10*3/uL (ref 0.0–0.1)
Basophils Relative: 1 % (ref 0–1)
EOS PCT: 7 % — AB (ref 0–5)
Eosinophils Absolute: 0.2 10*3/uL (ref 0.0–0.7)
HEMATOCRIT: 33.3 % — AB (ref 39.0–52.0)
Hemoglobin: 10.6 g/dL — ABNORMAL LOW (ref 13.0–17.0)
LYMPHS PCT: 18 % (ref 12–46)
Lymphs Abs: 0.5 10*3/uL — ABNORMAL LOW (ref 0.7–4.0)
MCH: 24.9 pg — AB (ref 26.0–34.0)
MCHC: 31.8 g/dL (ref 30.0–36.0)
MCV: 78.2 fL (ref 78.0–100.0)
Monocytes Absolute: 0.3 10*3/uL (ref 0.1–1.0)
Monocytes Relative: 12 % (ref 3–12)
Neutro Abs: 1.5 10*3/uL — ABNORMAL LOW (ref 1.7–7.7)
Neutrophils Relative %: 62 % (ref 43–77)
PLATELETS: 235 10*3/uL (ref 150–400)
RBC: 4.26 MIL/uL (ref 4.22–5.81)
RDW: 19 % — ABNORMAL HIGH (ref 11.5–15.5)
WBC: 2.5 10*3/uL — ABNORMAL LOW (ref 4.0–10.5)

## 2014-07-05 LAB — COMPREHENSIVE METABOLIC PANEL
ALT: 18 U/L (ref 0–53)
AST: 25 U/L (ref 0–37)
Albumin: 3.7 g/dL (ref 3.5–5.2)
Alkaline Phosphatase: 142 U/L — ABNORMAL HIGH (ref 39–117)
Anion gap: 8 (ref 5–15)
BUN: 13 mg/dL (ref 6–23)
CALCIUM: 8.7 mg/dL (ref 8.4–10.5)
CO2: 24 mmol/L (ref 19–32)
Chloride: 107 mEq/L (ref 96–112)
Creatinine, Ser: 1.06 mg/dL (ref 0.50–1.35)
GFR, EST NON AFRICAN AMERICAN: 80 mL/min — AB (ref >90)
Glucose, Bld: 101 mg/dL — ABNORMAL HIGH (ref 70–99)
Potassium: 3.7 mmol/L (ref 3.5–5.1)
Sodium: 139 mmol/L (ref 135–145)
Total Bilirubin: 1.9 mg/dL — ABNORMAL HIGH (ref 0.3–1.2)
Total Protein: 7.1 g/dL (ref 6.0–8.3)

## 2014-07-05 LAB — PROTIME-INR
INR: 3.49 — AB (ref 0.00–1.49)
INR: 3.79 — AB (ref 0.00–1.49)
PROTHROMBIN TIME: 37.6 s — AB (ref 11.6–15.2)
Prothrombin Time: 35.3 seconds — ABNORMAL HIGH (ref 11.6–15.2)

## 2014-07-05 LAB — LACTATE DEHYDROGENASE
LDH: 362 U/L — ABNORMAL HIGH (ref 94–250)
LDH: 328 U/L — ABNORMAL HIGH (ref 94–250)

## 2014-07-05 LAB — BRAIN NATRIURETIC PEPTIDE: B Natriuretic Peptide: 489.6 pg/mL — ABNORMAL HIGH (ref 0.0–100.0)

## 2014-07-05 LAB — MRSA PCR SCREENING: MRSA by PCR: NEGATIVE

## 2014-07-05 MED ORDER — WARFARIN - PHARMACIST DOSING INPATIENT: Freq: Every day | Status: DC

## 2014-07-05 MED ORDER — SODIUM CHLORIDE 0.9 % IV BOLUS (SEPSIS)
500.0000 mL | Freq: Once | INTRAVENOUS | Status: DC
  Administered 2014-07-05: 500 mL via INTRAVENOUS

## 2014-07-05 MED ORDER — ATORVASTATIN CALCIUM 40 MG PO TABS
40.0000 mg | ORAL_TABLET | Freq: Every day | ORAL | Status: DC
  Administered 2014-07-05: 40 mg via ORAL
  Filled 2014-07-05 (×2): qty 1

## 2014-07-05 MED ORDER — TAMSULOSIN HCL 0.4 MG PO CAPS
0.4000 mg | ORAL_CAPSULE | Freq: Every day | ORAL | Status: DC
  Administered 2014-07-05: 0.4 mg via ORAL
  Filled 2014-07-05 (×2): qty 1

## 2014-07-05 MED ORDER — SODIUM CHLORIDE 0.9 % IV BOLUS (SEPSIS)
500.0000 mL | Freq: Once | INTRAVENOUS | Status: AC
  Administered 2014-07-05: 500 mL via INTRAVENOUS

## 2014-07-05 MED ORDER — ASPIRIN EC 325 MG PO TBEC
325.0000 mg | DELAYED_RELEASE_TABLET | Freq: Every day | ORAL | Status: DC
  Administered 2014-07-05 – 2014-07-06 (×2): 325 mg via ORAL
  Filled 2014-07-05 (×2): qty 1

## 2014-07-05 MED ORDER — CLOPIDOGREL BISULFATE 75 MG PO TABS
75.0000 mg | ORAL_TABLET | Freq: Every day | ORAL | Status: DC
  Administered 2014-07-06: 75 mg via ORAL
  Filled 2014-07-05 (×2): qty 1

## 2014-07-05 MED ORDER — CARVEDILOL 3.125 MG PO TABS
3.1250 mg | ORAL_TABLET | Freq: Two times a day (BID) | ORAL | Status: DC
  Administered 2014-07-05 – 2014-07-06 (×2): 3.125 mg via ORAL
  Filled 2014-07-05 (×4): qty 1

## 2014-07-05 MED ORDER — LOSARTAN POTASSIUM 50 MG PO TABS
50.0000 mg | ORAL_TABLET | Freq: Two times a day (BID) | ORAL | Status: DC
  Administered 2014-07-05 – 2014-07-06 (×2): 50 mg via ORAL
  Filled 2014-07-05 (×3): qty 1

## 2014-07-05 MED ORDER — HYDRALAZINE HCL 50 MG PO TABS
50.0000 mg | ORAL_TABLET | Freq: Three times a day (TID) | ORAL | Status: DC
  Administered 2014-07-05 – 2014-07-06 (×3): 50 mg via ORAL
  Filled 2014-07-05 (×5): qty 1

## 2014-07-05 MED ORDER — ISOSORBIDE MONONITRATE ER 60 MG PO TB24
60.0000 mg | ORAL_TABLET | Freq: Every day | ORAL | Status: DC
  Administered 2014-07-05 – 2014-07-06 (×2): 60 mg via ORAL
  Filled 2014-07-05 (×2): qty 1

## 2014-07-05 MED ORDER — SODIUM CHLORIDE 0.9 % IV BOLUS (SEPSIS)
250.0000 mL | Freq: Once | INTRAVENOUS | Status: AC
Start: 2014-07-05 — End: 2014-07-05
  Administered 2014-07-05: 250 mL via INTRAVENOUS

## 2014-07-05 NOTE — Telephone Encounter (Signed)
Called pt's mother and friend Irish Lack) per pt request to update. Informed them of planned admission per Dr. Gala Romney. Will ask pt to call them when he is admitted with room #.

## 2014-07-05 NOTE — Telephone Encounter (Signed)
Pt's friend called this am to report intermittent VAD "low flow" alarms that started Saturday and have gone off at "least 6 - 7 times" with prompt on controller to "call hospital contact".  Pt denies symptoms, but is worried about continual alarms. Advised patient per Dr. Gala Romney to come to ED, pt would rather come to clinic. Instructed to come at 9 am for full set of labs and clinic visit. Pt has no car at this time, but will "try to find a way". Instructed to call 911 and come via ambulance to ED if unable to arrange transportation.  Irish Lack called VAD pager again at 8:00 am to report pt is coming via ambulance to ED. ED charge nurse aware. States he feels "Okay" but alarms are continuing. Friend ask that pt's mother be called with update; her car is also "down" and she is unable to come to hospital with patient.

## 2014-07-05 NOTE — Progress Notes (Signed)
Echocardiogram 2D Echocardiogram has been performed.  [name] 07/05/2014, 4:32 PM

## 2014-07-05 NOTE — ED Provider Notes (Signed)
CSN: [id number]     Arrival date & time 07/05/14  4098 History   None    Chief Complaint  Patient presents with  . LVAD pt      (Consider location/radiation/quality/duration/timing/severity/associated sxs/prior Treatment) Patient is a 54 y.o. male presenting with general illness. The history is provided by the patient.  Illness Location:  LVAD Quality:  Low flow alarm sounding Onset quality:  Sudden Duration:  1 day Timing:  Constant Progression:  Unchanged Chronicity:  New Context:  Patient with LVAD machine, is currently asymptomatic but reports low flow alarm from LVAD has gone off 6-7 times today Relieved by:  Nothing tried Worsened by:  Nothing tried Ineffective treatments:  Nothing tried Associated symptoms: no abdominal pain, no chest pain, no congestion, no diarrhea, no fever, no headaches, no myalgias, no nausea, no rash, no shortness of breath, no sore throat, no vomiting and no wheezing     Past Medical History  Diagnosis Date  . CHF (congestive heart failure)     EF- 10-15  . Medically noncompliant   . Mitral regurgitation   . Tobacco user   . HTN (hypertension)   . AICD (automatic cardioverter/defibrillator) present   . GERD (gastroesophageal reflux disease)   . Substance abuse   . Chronic renal insufficiency   . Syncope   . Thrombus 08/06/2010  . SYSTOLIC HEART FAILURE, CHRONIC 09/22/2008    Qualifier: Diagnosis of  By: Gala Romney, MD, Trixie Dredge   . LV (left ventricular) mural thrombus 01/28/2011  . ICD - IN SITU 09/16/2008    Qualifier: Diagnosis of  By: Wonda Amis    . MITRAL STENOSIS/ INSUFFICIENCY, NON-RHEUMATIC 09/22/2008    Qualifier: Diagnosis of  By: Gala Romney, MD, Trixie Dredge Hepatomegaly 09/16/2008    Qualifier: Diagnosis of  By: Wonda Amis    . High cholesterol 02/26/2012    "at one time"  . Sleep apnea   . Exertional dyspnea 02/26/2012  . History of blood transfusion 08/2011    "when I had heart pump"  . Migraines    . COMMON MIGRAINE 06/14/2009    Qualifier: Diagnosis of  By: Jonny Ruiz MD, Len Blalock   . History of gout 02/26/2012  . Depression 08/11/2013    Pt denies  . Bipolar affective disorder 10/22/2011    pt denies this hx 02/26/2012   Past Surgical History  Procedure Laterality Date  . Cardiac defibrillator placement  ~ 2008  . Left ventricular assist device  08/2011  . Radiology with anesthesia N/A 07/19/2013    Procedure: RADIOLOGY WITH ANESTHESIA;  Surgeon: Oneal Grout, MD;  Location: [facility] OR;  Service: Radiology;  Laterality: N/A;  . Tracheostomy      feinstein  . Right heart catheterization N/A 07/08/2011    Procedure: RIGHT HEART CATH;  Surgeon: Dolores Patty, MD;  Location: [facility] CATH LAB;  Service: Cardiovascular;  Laterality: N/A;  . Right heart catheterization N/A 09/09/2011    Procedure: RIGHT HEART CATH;  Surgeon: Dolores Patty, MD;  Location: [facility] CATH LAB;  Service: Cardiovascular;  Laterality: N/A;  . Intra-aortic balloon pump insertion  09/09/2011    Procedure: INTRA-AORTIC BALLOON PUMP INSERTION;  Surgeon: Dolores Patty, MD;  Location: [facility] CATH LAB;  Service: Cardiovascular;;  . Left and right heart catheterization with coronary angiogram N/A 07/19/2013    Procedure: LEFT AND RIGHT HEART CATHETERIZATION WITH CORONARY ANGIOGRAM;  Surgeon: Dolores Patty, MD;  Location: [facility] CATH LAB;  Service: Cardiovascular;  Laterality: N/A;   Family History  Problem Relation Age of Onset  . Coronary artery disease Neg Hx    History  Substance Use Topics  . Smoking status: Former Smoker -- 0.25 packs/day for 33 years    Types: Cigarettes    Quit date: 06/29/2011  . Smokeless tobacco: Former Neurosurgeon    Quit date: 06/29/2011  . Alcohol Use: No    Review of Systems  Constitutional: Negative for fever, diaphoresis, activity change and appetite change.  HENT: Negative for congestion, facial swelling, sore throat, tinnitus, trouble swallowing and voice change.   Eyes: Negative for pain,  redness and visual disturbance.  Respiratory: Negative for chest tightness, shortness of breath and wheezing.   Cardiovascular: Negative for chest pain, palpitations and leg swelling.  Gastrointestinal: Negative for nausea, vomiting, abdominal pain, diarrhea, constipation and abdominal distention.  Endocrine: Negative.   Genitourinary: Negative.  Negative for dysuria, decreased urine volume, scrotal swelling and testicular pain.  Musculoskeletal: Negative for myalgias, back pain and gait problem.  Skin: Negative.  Negative for rash.  Neurological: Negative.  Negative for dizziness, tremors, weakness and headaches.  Psychiatric/Behavioral: Negative for suicidal ideas, hallucinations and self-injury. The patient is not nervous/anxious.       Allergies  Ace inhibitors and Lexapro  Home Medications   Prior to Admission medications   Medication Sig Start Date End Date Taking? Authorizing Provider  aspirin 325 MG EC tablet Take 1 tablet (325 mg total) by mouth daily. 05/09/14   [name], NP  atorvastatin (LIPITOR) 40 MG tablet Take 1 tablet (40 mg total) by mouth daily. 05/09/14   [name], NP  carvedilol (COREG) 3.125 MG tablet Take 1 tablet (3.125 mg total) by mouth 2 (two) times daily with a meal. 05/09/14   [name], NP  clopidogrel (PLAVIX) 75 MG tablet Take 1 tablet (75 mg total) by mouth daily with breakfast. 05/09/14   [name], NP  enoxaparin (LOVENOX) 80 MG/0.8ML injection Inject 0.8 mLs (80 mg total) into the skin every 12 (twelve) hours. 06/09/14   [name], NP  furosemide (LASIX) 20 MG tablet Take 1 tablet (20 mg total) by mouth daily. Or as directed 06/12/14   Dolores Patty, MD  hydrALAZINE (APRESOLINE) 50 MG tablet Take 1 tablet (50 mg total) by mouth 3 (three) times daily. 05/09/14   [name], NP  isosorbide mononitrate (IMDUR) 60 MG 24 hr tablet Take 1 tablet (60 mg total) by mouth daily. 05/09/14   [name], NP  losartan (COZAAR)  50 MG tablet Take 1 tablet (50 mg total) by mouth 2 (two) times daily. 05/09/14   [name], NP  potassium chloride SA (K-DUR,KLOR-CON) 20 MEQ tablet Take two tabs daily. May take one extra tab on days you take extra lasix 05/26/14   Dolores Patty, MD  tamsulosin (FLOMAX) 0.4 MG CAPS capsule Take 1 capsule (0.4 mg total) by mouth daily. 05/09/14   [name], NP   BP   Pulse 80  Temp(Src) 99.1 F (37.3 C) (Oral)  Resp 14  Wt 188 lb (85.276 kg)  SpO2 98% Physical Exam  Constitutional: He is oriented to person, place, and time. He appears well-developed and well-nourished. No distress.  HENT:  Head: Normocephalic and atraumatic.  Right Ear: External ear normal.  Left Ear: External ear normal.  Nose: Nose normal.  Mouth/Throat: Oropharynx is clear and moist.  Eyes: Conjunctivae and EOM are normal. Pupils are equal, round,  and reactive to light. No scleral icterus.  Neck: Normal range of motion. Neck supple. No JVD present. No tracheal deviation present. No thyromegaly present.  Cardiovascular: Normal rate.   Mechanical cardiac sounds consistent with functioning LVAD.   Pulmonary/Chest: Effort normal and breath sounds normal. No stridor. No respiratory distress. He has no wheezes. He has no rales.  Abdominal: Soft. He exhibits no distension. There is no tenderness. There is no rebound and no guarding.  Musculoskeletal: Normal range of motion. He exhibits edema (2+ pitting edema equal in bilateral lower extremities). He exhibits no tenderness.  Neurological: He is alert and oriented to person, place, and time. No cranial nerve deficit. He exhibits normal muscle tone. Coordination normal.  Skin: Skin is warm and dry. No rash noted. He is not diaphoretic.  Psychiatric: He has a normal mood and affect. His behavior is normal.  Nursing note and vitals reviewed.   ED Course  Procedures (including critical care time) Labs Review Labs Reviewed  CBC WITH DIFFERENTIAL - Abnormal;  Notable for the following:    WBC 2.5 (*)    Hemoglobin 10.6 (*)    HCT 33.3 (*)    MCH 24.9 (*)    RDW 19.0 (*)    Neutro Abs 1.5 (*)    Lymphs Abs 0.5 (*)    Eosinophils Relative 7 (*)    All other components within normal limits  COMPREHENSIVE METABOLIC PANEL - Abnormal; Notable for the following:    Glucose, Bld 101 (*)    Alkaline Phosphatase 142 (*)    Total Bilirubin 1.9 (*)    GFR calc non Af Amer 80 (*)    All other components within normal limits  PROTIME-INR - Abnormal; Notable for the following:    Prothrombin Time 35.3 (*)    INR 3.49 (*)    All other components within normal limits  LACTATE DEHYDROGENASE - Abnormal; Notable for the following:    LDH 362 (*)    All other components within normal limits  BRAIN NATRIURETIC PEPTIDE - Abnormal; Notable for the following:    B Natriuretic Peptide 489.6 (*)    All other components within normal limits    Imaging Review Dg Chest 2 View  07/05/2014   CLINICAL DATA:  Left ventricular assist device with all arms seen running. Evaluate position of the device.  EXAM: CHEST  2 VIEW  COMPARISON:  PA and lateral chest 11/27/2011. Single view of the chest 12/15/2013.  FINDINGS: Left ventricular assist device is in place and unchanged in position. The appearance is stable. Also again seen is an AICD with the lead unchanged in position and intact. The generator is also unremarkable. There is cardiomegaly without edema. The lungs are clear. No pneumothorax or pleural effusion.  IMPRESSION: Stable appearance of left ventricular assist device and AICD back to 11/27/2011.  Cardiomegaly without acute disease.   Electronically Signed   By: Drusilla Kanner M.D.   On: 07/05/2014 09:18     EKG Interpretation   Date/Time:  Tuesday July 05 2014 08:30:05 EST Ventricular Rate:  79 PR Interval:  72 QRS Duration: 143 QT Interval:  451 QTC Calculation: 517 R Axis:   -85 Text Interpretation:  Sinus rhythm Short PR interval IVCD, consider   atypical RBBB Probable anterolateral infarct, age indeterm Baseline wander  in lead(s) III aVF Abnormal ekg Since last tracing RBBB is not seen  Confirmed by MILLER  MD, BRIAN ([id number]) on 07/05/2014 8:55:21 AM      MDM  Final diagnoses:  LVAD (left ventricular assist device) present  Left ventricular assist device (LVAD) complication, initial encounter    The patient is a 54 year old male with significant past medical history including heart failure with left ventricular assist device placement who presents due to LVAD low-flow alarm going off multiple times this morning. On arrival to the ED the patient is completely asymptomatic and reports his only concern is the alarm going off. Patient specifically denies any chest pain shortness of breath and lightheadedness dizziness or syncope. Patient is mentating appropriately and appears in no distress. Cardiology present at bedside upon patient arrival and helped evaluate patient. Chest x-ray and baseline labs are ordered which show supratherapeutic INR. Patient is admitted to the cardiology service for further management.  Patient seen with attending, Dr. Hyacinth Meeker, who oversaw clinical decision making.     Lula Olszewski, MD 07/05/14 1021  Vida Roller, MD 07/06/14 [id number]

## 2014-07-05 NOTE — Progress Notes (Signed)
VAD coordinator met patient in ED upon his arrival with VAD cart.  Pt awake, alert, controller not alarming at this time. Dr. Haroldine Laws in room to evaluate patient. MAP 94 - 96; auto cuff 110/86 (92).  VAD parameters: Speed:  9390 Flow:  2.8 PI:  5.1 Power:  4.8 Fixed speed:  9400 Low speed limit:  8800 Alarms:  142 low flow alarms on 07/05/14 (from 5:07am - 7:11am);  29 low flow alarms on 07/04/14 from 8:21 am - 8:42 am Events:  64 PI events on 07/04/14 Primary Controller:  Replace back up battery in  28 months (10/17). Back up controller:   Replace back up battery in  20 months  93/17).  Pt does confirm alarms have been occuring primarily when he is in bed, sometimes while lying on right side. Repositioned pt to try and reproduce alarms - none occurred.   Accompanied patient to radiology for PA and Lat CXR (for check of cannula placement).  Event log downloaded; will send to [facility] for evaluation.  [place] Corene Cornea at [phone]) and asked for ICD interrogation to r/o ventricular arrhythmias (pt has single lead ICD).   Patient remains awake alert; planned admission for observation per Dr. Haroldine Laws; awaiting bed.

## 2014-07-05 NOTE — ED Notes (Signed)
To ED via [facility] from home with c/o LVAD machine having low flow alarms since Saturday/Sunday, Dr. Gala Romney and Kirt Boys, LVAD nurses at bedside on arrival. Pt is alert/oriented x 4.

## 2014-07-05 NOTE — H&P (Signed)
Marland Kitchen  ADVANCED HF TEAM H&P  HPI:    [name] is a 54 yo male with a history of severe CHF, NICM s/p LVAD and TVR (09/2011), VT, NSTEMI, LV thrombus, CKD, GERD, stroke and LVAD hemolysis with LVAD exchange (08/2013).   He was admitted to [facility] January 2015 with NSTEMI and RV failure. He had a difficult course that was complicated by embolic right CVA. He was intubated and eventually had a tracheostomy placed 07/28/13. He was transferred to inpatient rehab for extensive rehab and was making progression, however despite aggressive therapy with heparin, coumadin, ASA and Plavix, his LDH remained persistently elevated in the 950 range (was about 1750 on admit). He did not experience any pump dysfunction, however the case was discussed with Dr Allena Katz and [name] at [facility] and it was felt to transfer him down there for possible pump exchange. He underwent a pump exchange 08/31/13. Post op was complicated by bleeding and he received multiple transfusions. He had AKI and had intermittent HD and underwent last HD on 09/24/13. Last Cr on chart 1.9 (10/02/13)  Admitted 5/13-5/18/15 for volume overload and possible hemolysis. LDH 647 and INR 1.69 on admission. Treated with IV lasix and IV heparin. While in the hospital developed some PI events and speed was decreased to 9400. ICD interrogated and showed A flutter and he was placed on IV amiodarone and then transitioned to 200 mg PO BID. He cardioverted to NSR on 5/15. LDH trended down and on discharge was 530 and INR 2.4.  Called this am to report low flow alarms since Sunday. These have been asymptomatic. No dizziness, palpitations, CP, SOB, edema, bleeding or diarrhea. In ER, MAP 94. In NSR. Multiple low flow alarms on VAD. Denies any changes in meds. Not taking extra diuretic. No fevers or chills.     LVAD INTERROGATION:  HeartMate II LVAD:  Flow 3.3 liters/min, speed 9400, power 6.6, PI 4.8. Multiple low flow alarms     Review of Systems: [y] = yes, [ ]  = no   General:  Weight gain [ ] ; Weight loss [ ] ; Anorexia [ ] ; Fatigue [ ] ; Fever [ ] ; Chills [ ] ; Weakness [ ]   Cardiac: Chest pain/pressure [ ] ; Resting SOB [ ] ; Exertional SOB [ ] ; Orthopnea [ ] ; Pedal Edema [ ] ; Palpitations [ ] ; Syncope [ ] ; Presyncope [ ] ; Paroxysmal nocturnal dyspnea[ ]   Pulmonary: Cough [ ] ; Wheezing[ ] ; Hemoptysis[ ] ; Sputum [ ] ; Snoring [ ]   GI: Vomiting[ ] ; Dysphagia[ ] ; Melena[ ] ; Hematochezia [ ] ; Heartburn[ ] ; Abdominal pain [ ] ; Constipation [ ] ; Diarrhea [ ] ; BRBPR [ ]   GU: Hematuria[ ] ; Dysuria [ ] ; Nocturia[ ]   Vascular: Pain in legs with walking [ ] ; Pain in feet with lying flat [ ] ; Non-healing sores [ ] ; Stroke [ ] ; TIA [ ] ; Slurred speech [ ] ;  Neuro: Headaches[ ] ; Vertigo[ ] ; Seizures[ ] ; Paresthesias[ ] ;Blurred vision [ ] ; Diplopia [ ] ; Vision changes [ ]   Ortho/Skin: Arthritis [ ] ; Joint pain [ ] ; Muscle pain [ ] ; Joint swelling [ ] ; Back Pain [ ] ; Rash [ ]   Psych: Depression[ ] ; Anxiety[ ]   Heme: Bleeding problems [ ] ; Clotting disorders [ ] ; Anemia [ ]   Endocrine: Diabetes [ ] ; Thyroid dysfunction[ ]   Home Medications Prior to Admission medications   Medication Sig Start Date End Date Taking? Authorizing Provider  aspirin 325 MG EC tablet Take 1 tablet (325 mg total) by mouth daily. 05/09/14   [name], NP  atorvastatin (LIPITOR) 40 MG tablet Take 1 tablet (40 mg total) by mouth daily. 05/09/14   [name], NP  carvedilol (COREG) 3.125 MG tablet Take 1 tablet (3.125 mg total) by mouth 2 (two) times daily with a meal. 05/09/14   [name], NP  clopidogrel (PLAVIX) 75 MG tablet Take 1 tablet (75 mg total) by mouth daily with breakfast. 05/09/14   [name], NP  enoxaparin (LOVENOX) 80 MG/0.8ML injection Inject 0.8 mLs (80 mg total) into the skin every 12 (twelve) hours. 06/09/14   [name], NP  furosemide (LASIX) 20 MG tablet Take 1 tablet (20 mg total) by mouth daily. Or as directed 06/12/14   Dolores Patty, MD  hydrALAZINE (APRESOLINE)  50 MG tablet Take 1 tablet (50 mg total) by mouth 3 (three) times daily. 05/09/14   [name], NP  isosorbide mononitrate (IMDUR) 60 MG 24 hr tablet Take 1 tablet (60 mg total) by mouth daily. 05/09/14   [name], NP  losartan (COZAAR) 50 MG tablet Take 1 tablet (50 mg total) by mouth 2 (two) times daily. 05/09/14   [name], NP  potassium chloride SA (K-DUR,KLOR-CON) 20 MEQ tablet Take two tabs daily. May take one extra tab on days you take extra lasix 05/26/14   Dolores Patty, MD  tamsulosin (FLOMAX) 0.4 MG CAPS capsule Take 1 capsule (0.4 mg total) by mouth daily. 05/09/14   [name], NP  warfarin (COUMADIN) 3 MG tablet Take 3 mg by mouth daily. For INR 2.0 - 3.0 12/08/13   Dolores Patty, MD  warfarin (COUMADIN) 4 MG tablet Take one tab daily or as instructed for INR 2.0 - 3.0 05/09/14   [name], NP    Past Medical History: Past Medical History  Diagnosis Date  . CHF (congestive heart failure)     EF- 10-15  . Medically noncompliant   . Mitral regurgitation   . Tobacco user   . HTN (hypertension)   . AICD (automatic cardioverter/defibrillator) present   . GERD (gastroesophageal reflux disease)   . Substance abuse   . Chronic renal insufficiency   . Syncope   . Thrombus 08/06/2010  . SYSTOLIC HEART FAILURE, CHRONIC 09/22/2008    Qualifier: Diagnosis of  By: Gala Romney, MD, Trixie Dredge   . LV (left ventricular) mural thrombus 01/28/2011  . ICD - IN SITU 09/16/2008    Qualifier: Diagnosis of  By: Wonda Amis    . MITRAL STENOSIS/ INSUFFICIENCY, NON-RHEUMATIC 09/22/2008    Qualifier: Diagnosis of  By: Gala Romney, MD, Trixie Dredge Hepatomegaly 09/16/2008    Qualifier: Diagnosis of  By: Wonda Amis    . High cholesterol 02/26/2012    "at one time"  . Sleep apnea   . Exertional dyspnea 02/26/2012  . History of blood transfusion 08/2011    "when I had heart pump"  . Migraines   . COMMON MIGRAINE 06/14/2009    Qualifier:  Diagnosis of  By: Jonny Ruiz MD, Len Blalock   . History of gout 02/26/2012  . Depression 08/11/2013    Pt denies  . Bipolar affective disorder 10/22/2011    pt denies this hx 02/26/2012    Past Surgical History: Past Surgical History  Procedure Laterality Date  . Cardiac defibrillator placement  ~ 2008  . Left ventricular assist device  08/2011  . Radiology with anesthesia N/A 07/19/2013    Procedure: RADIOLOGY WITH ANESTHESIA;  Surgeon: Simonne Maffucci  Kerman Passey, MD;  Location: [facility] OR;  Service: Radiology;  Laterality: N/A;  . Tracheostomy      feinstein  . Right heart catheterization N/A 07/08/2011    Procedure: RIGHT HEART CATH;  Surgeon: Dolores Patty, MD;  Location: [facility] CATH LAB;  Service: Cardiovascular;  Laterality: N/A;  . Right heart catheterization N/A 09/09/2011    Procedure: RIGHT HEART CATH;  Surgeon: Dolores Patty, MD;  Location: [facility] CATH LAB;  Service: Cardiovascular;  Laterality: N/A;  . Intra-aortic balloon pump insertion  09/09/2011    Procedure: INTRA-AORTIC BALLOON PUMP INSERTION;  Surgeon: Dolores Patty, MD;  Location: [facility] CATH LAB;  Service: Cardiovascular;;  . Left and right heart catheterization with coronary angiogram N/A 07/19/2013    Procedure: LEFT AND RIGHT HEART CATHETERIZATION WITH CORONARY ANGIOGRAM;  Surgeon: Dolores Patty, MD;  Location: [facility] CATH LAB;  Service: Cardiovascular;  Laterality: N/A;    Family History: Family History  Problem Relation Age of Onset  . Coronary artery disease Neg Hx     Social History: History   Social History  . Marital Status: Divorced    Spouse Name: N/A    Number of Children: N/A  . Years of Education: N/A   Social History Main Topics  . Smoking status: Former Smoker -- 0.25 packs/day for 33 years    Types: Cigarettes    Quit date: 06/29/2011  . Smokeless tobacco: Former Neurosurgeon    Quit date: 06/29/2011  . Alcohol Use: No  . Drug Use: No     Comment: denies  . Sexual Activity: Not Currently   Other Topics Concern    . Not on file   Social History Narrative    Allergies:  Allergies  Allergen Reactions  . Ace Inhibitors Cough  . Lexapro [Escitalopram Oxalate] Other (See Comments)    somnolence    Objective:    Vital Signs:   SpO2:  [100 %] 100 % (01/12 0822)   Filed Weights   07/05/14 0839  Weight: 85.276 kg (188 lb)    Mean arterial Pressure 94  Physical Exam: General:  Well appearing. No resp difficulty HEENT: normal Neck: supple. JVP looks flat. Carotids 2+ bilat; no bruits. No lymphadenopathy or thryomegaly appreciated. Cor: Mechanical heart sounds with LVAD hum present. Lungs: clear Abdomen: soft, nontender, nondistended. No hepatosplenomegaly. No bruits or masses. Good bowel sounds. Driveline: C/D/I; securement device intact and driveline incorporated Extremities: no cyanosis, clubbing, rash, edema Neuro: alert & orientedx3, cranial nerves grossly intact. moves all 4 extremities w/o difficulty. Affect pleasant  Telemetry: SR 80  Labs: Basic Metabolic Panel: No results for input(s): NA, K, CL, CO2, GLUCOSE, BUN, CREATININE, CALCIUM, MG, PHOS in the last 168 hours.  Liver Function Tests: No results for input(s): AST, ALT, ALKPHOS, BILITOT, PROT, ALBUMIN in the last 168 hours. No results for input(s): LIPASE, AMYLASE in the last 168 hours. No results for input(s): AMMONIA in the last 168 hours.  CBC: No results for input(s): WBC, NEUTROABS, HGB, HCT, MCV, PLT in the last 168 hours.  Cardiac Enzymes: No results for input(s): CKTOTAL, CKMB, CKMBINDEX, TROPONINI in the last 168 hours.  BNP: BNP (last 3 results)  Recent Labs  03/31/14 0950 05/04/14 1030 05/09/14 1052  PROBNP 634.7* 312.0* 1914.0*    CBG: No results for input(s): GLUCAP in the last 168 hours.  Coagulation Studies: No results for input(s): LABPROT, INR in the last 72 hours.  Other results: EKG: NSR 80 Imaging:  No results found.  Assessment:   1. Low flow alarms on VAD -  asymptomatic 2. Chronic systolic HF  S/p HM II VAD with previous pump exchange 3. H/o pump thrombosis with CVA and NSTEMI 4. CKD, stage III  Plan/Discussion:    Suspect low flow alarms related to hypovolemia. Will give 500cc IVF and monitor. Check labs. Interrogate ICD to look for arrhythmias and check CXR to look at pump positioning. Will observe at least overnight. Hold diuretics for now.   I reviewed the LVAD parameters from today, and compared the results to the patient's prior recorded data.  No programming changes were made.  The LVAD is functioning within specified parameters.  The patient performs LVAD self-test daily.  LVAD interrogation was negative for any significant power changes, alarms or PI events/speed drops.  LVAD equipment check completed and is in good working order.  Back-up equipment present.   LVAD education done on emergency procedures and precautions and reviewed exit site care.  Length of Stay: 0  Arvilla Meres MD 07/05/2014, 8:38 AM VAD Team Pager [phone] (7am - 7am) +++VAD ISSUES ONLY+++ Advanced Heart Failure Team Pager [phone] (M-F; 7a - 4p)  Please contact [facility] for night-coverage after hours (4p -7a ) and weekends on [email or web address] for all non- LVAD Issues

## 2014-07-05 NOTE — Progress Notes (Signed)
VAD coordinator paged to ED to transport patient to 2H22. Transported on batteries via stretcher. ED nurse accompanied patient. Placed patient on power module with no red heart/low flow alarms during transport.   VAD dressing removed and site care performed using sterile technique. Dressing applied inaccurately by patient.  Drive line exit site cleaned with Chlora prep applicators x 2, allowed to dry, and Sorbaview dressing with biopatch re-applied. Exit site well healed and incorporated. Their is @ 2 cm velour showing at exit site. No redness or tenderness noted; old bloody drainage with no foul odor present. Pt admits to "dropping controller" several times; over last few days;  is not wearing attachment device - "makes me itch".  Drive line anchor re-applied; omitted skin prep to r/o skin prep intolerance. Pt denies fever or chills. Driveline dressing is being changed weekly per patient.                 Marland Kitchen

## 2014-07-05 NOTE — Progress Notes (Signed)
ANTICOAGULATION CONSULT NOTE - Initial Consult  Pharmacy Consult for Warfarin Indication: LVAD  Allergies  Allergen Reactions  . Ace Inhibitors Cough  . Lexapro [Escitalopram Oxalate] Other (See Comments)    somnolence    Patient Measurements: Height: 5\' 4"  (162.6 cm) Weight: 184 lb 8.4 oz (83.7 kg) IBW/kg (Calculated) : 59.2 Heparin Dosing Weight: n/a  Vital Signs: Temp: 98.7 F (37.1 C) (01/12 1455) Temp Source: Oral (01/12 1455) BP: 119/97 mmHg (01/12 0930) Pulse Rate: 79 (01/12 0930)  Labs:  Recent Labs  07/05/14 0855  HGB 10.6*  HCT 33.3*  PLT 235  LABPROT 35.3*  INR 3.49*  CREATININE 1.06    Estimated Creatinine Clearance: 80.5 mL/min (by C-G formula based on Cr of 1.06).   Medical History: Past Medical History  Diagnosis Date  . CHF (congestive heart failure)     EF- 10-15  . Medically noncompliant   . Mitral regurgitation   . Tobacco user   . HTN (hypertension)   . AICD (automatic cardioverter/defibrillator) present   . GERD (gastroesophageal reflux disease)   . Substance abuse   . Chronic renal insufficiency   . Syncope   . Thrombus 08/06/2010  . SYSTOLIC HEART FAILURE, CHRONIC 09/22/2008    Qualifier: Diagnosis of  By: Gala Romney, MD, Trixie Dredge   . LV (left ventricular) mural thrombus 01/28/2011  . ICD - IN SITU 09/16/2008    Qualifier: Diagnosis of  By: Wonda Amis    . MITRAL STENOSIS/ INSUFFICIENCY, NON-RHEUMATIC 09/22/2008    Qualifier: Diagnosis of  By: Gala Romney, MD, Trixie Dredge Hepatomegaly 09/16/2008    Qualifier: Diagnosis of  By: Wonda Amis    . High cholesterol 02/26/2012    "at one time"  . Sleep apnea   . Exertional dyspnea 02/26/2012  . History of blood transfusion 08/2011    "when I had heart pump"  . Migraines   . COMMON MIGRAINE 06/14/2009    Qualifier: Diagnosis of  By: Jonny Ruiz MD, Len Blalock   . History of gout 02/26/2012  . Depression 08/11/2013    Pt denies  . Bipolar affective disorder 10/22/2011    pt denies this hx 02/26/2012    Medications:  PTA Coumadin dose 4 mg daily.  Pt reports last dose taken last night.    Assessment: 54 yo male with a hx of severe CHF, s/p LVAD placement, and LVAD exchange 3/15.  Admitted today for low-flow alarms since Sunday.  Pharmacy asked to dose Coumadin while he is here.  Today's INR supratherapeutic at 3.49.  CBC okay, no bleeding noted.  Goal of Therapy:  INR 2-3 Monitor platelets by anticoagulation protocol: Yes   Plan:  1. No Coumadin tonight. 2. Daily PT/INR.  Tad Moore, BCPS  Clinical Pharmacist Pager [phone]  07/05/2014 3:12 PM

## 2014-07-05 NOTE — ED Provider Notes (Signed)
54 year old male, history of left ventricular assist device which has been having local arms, otherwise he has no symptoms, on exam he has clear lung sounds, his heart is consistent with an LVAD machine, he otherwise has unremarkable vital signs, the cardiology team is at the bedside to assist with evaluation. He does not appear in distress.  Cardiology to admit   EKG Interpretation  Date/Time:  Tuesday July 05 2014 08:30:05 EST Ventricular Rate:  79 PR Interval:  72 QRS Duration: 143 QT Interval:  451 QTC Calculation: 517 R Axis:   -85 Text Interpretation:  Sinus rhythm Short PR interval IVCD, consider atypical RBBB Probable anterolateral infarct, age indeterm Baseline wander in lead(s) III aVF Abnormal ekg Since last tracing RBBB is not seen Confirmed by Rustyn Conery  MD, Chue Berkovich ([id number]) on 07/05/2014 8:55:21 AM      I saw and evaluated the patient, reviewed the resident's note and I agree with the findings and plan.   I personally interpreted the EKG as well as the resident and agree with the interpretation on the resident's chart.  Final diagnoses:  LVAD (left ventricular assist device) present  Left ventricular assist device (LVAD) complication, initial encounter      Vida Roller, MD 07/05/14 1127

## 2014-07-06 DIAGNOSIS — E869 Volume depletion, unspecified: Principal | ICD-10-CM

## 2014-07-06 LAB — COMPREHENSIVE METABOLIC PANEL
ALBUMIN: 3.5 g/dL (ref 3.5–5.2)
ALK PHOS: 126 U/L — AB (ref 39–117)
ALT: 15 U/L (ref 0–53)
ANION GAP: 6 (ref 5–15)
AST: 20 U/L (ref 0–37)
BUN: 10 mg/dL (ref 6–23)
CO2: 22 mmol/L (ref 19–32)
Calcium: 8.3 mg/dL — ABNORMAL LOW (ref 8.4–10.5)
Chloride: 106 mEq/L (ref 96–112)
Creatinine, Ser: 1.03 mg/dL (ref 0.50–1.35)
GFR calc Af Amer: >90 mL/min (ref >90)
GFR calc non Af Amer: 82 mL/min — ABNORMAL LOW (ref >90)
Glucose, Bld: 100 mg/dL — ABNORMAL HIGH (ref 70–99)
POTASSIUM: 3.5 mmol/L (ref 3.5–5.1)
Sodium: 134 mmol/L — ABNORMAL LOW (ref 135–145)
TOTAL PROTEIN: 6.4 g/dL (ref 6.0–8.3)
Total Bilirubin: 1.7 mg/dL — ABNORMAL HIGH (ref 0.3–1.2)

## 2014-07-06 LAB — PROTIME-INR
INR: 3.87 — AB (ref 0.00–1.49)
Prothrombin Time: 38.3 seconds — ABNORMAL HIGH (ref 11.6–15.2)

## 2014-07-06 MED ORDER — ACETAMINOPHEN 325 MG PO TABS
650.0000 mg | ORAL_TABLET | Freq: Once | ORAL | Status: AC
  Administered 2014-07-06: 650 mg via ORAL
  Filled 2014-07-06: qty 2

## 2014-07-06 MED ORDER — WARFARIN SODIUM 4 MG PO TABS: 4.0000 mg | ORAL_TABLET | Freq: Every day | ORAL | Status: DC

## 2014-07-06 MED ORDER — FUROSEMIDE 20 MG PO TABS: 20.0000 mg | ORAL_TABLET | ORAL | Status: DC

## 2014-07-06 NOTE — Progress Notes (Signed)
07/06/2014 1:00 PM Discharge instructions given. Verified understanding. Connected to batteries and dressed. All belongings with pt. Cab called and charge nurse to stay with pt until the cab comes. Rusti Arizmendi, Linnell Fulling

## 2014-07-06 NOTE — Discharge Summary (Signed)
Advanced Heart Failure Team  Discharge Summary   Patient ID: [name] MRN: [id number], DOB/AGE: Feb 09, 1961 54 y.o. Admit date: 07/05/2014 D/C date:     07/06/2014   Primary Discharge Diagnoses:  1. Low flow alarms on VAD (due to #2) - asymptomatic  2. Volume depletion 3. Chronic systolic HF S/p HM II VAD with previous pump exchange 4. H/o pump thrombosis with CVA and NSTEMI 5. CKD, stage III  Hospital Course:  [name] is a 54 yo male with a history of severe CHF, NICM s/p LVAD and TVR (09/2011), VT, NSTEMI, LV thrombus, CKD, GERD, stroke and LVAD hemolysis with LVAD exchange (08/2013).   He was admitted to [facility] January 2015 with NSTEMI and RV failure. He had a difficult course that was complicated by embolic right CVA. He was intubated and eventually had a tracheostomy placed 07/28/13. He was transferred to inpatient rehab for extensive rehab and was making progression, however despite aggressive therapy with heparin, coumadin, ASA and Plavix, his LDH remained persistently elevated in the 950 range (was about 1750 on admit). He did not experience any pump dysfunction, however the case was discussed with Dr Allena Katz and [name] at [facility] and it was felt to transfer him down there for possible pump exchange. He underwent a pump exchange 08/31/13. Post op was complicated by bleeding and he received multiple transfusions. He had AKI and had intermittent HD and underwent last HD on 09/24/13. Last Cr on chart 1.9 (10/02/13)  Admitted 5/13-5/18/15 for volume overload and possible hemolysis. LDH 647 and INR 1.69 on admission. Treated with IV lasix and IV heparin. While in the hospital developed some PI events and speed was decreased to 9400. ICD interrogated and showed A flutter and he was placed on IV amiodarone and then transitioned to 200 mg PO BID. He cardioverted to NSR on 5/15. LDH trended down and on discharge was 530 and INR 2.4.  Admitted yesterday with multiple low flow alarms noted on LVAD. Lab work was  obtained with therapeutic INR, stable renal/LDH and hemoglobin. No evidence of bleeding was noted. On exam he appeared dry and received a total of 750 cc normal saline. Diuretics were held. VAD interrogation today howed no additional low flow alarms.  Plan to cut lasix back to every other day. An ECHO was performed and showed EF 30% with mod to severe RV dysfunction. Lasix cut back from 20 mg daily to 20 mg every other day.  Pharmacy followed for INR and provided coumadin recommendations. He will continue to be followed closely in the VAD clinic and has follow up next Tuesday. Plan to check LDH, CBC, BMET, INR.   LVAD Interrogation HM II:   Speed:  9400  Flow: 3.3      PI:   6.9   Power:  5     Back-up speed: 8800    Mean arterial Pressure 92  Physical Exam: General: Well appearing. No resp difficulty HEENT: normal Neck: supple. JVP looks flat. Carotids 2+ bilat; no bruits. No lymphadenopathy or thryomegaly appreciated. Cor: Mechanical heart sounds with LVAD hum present. Lungs: clear Abdomen: soft, nontender, nondistended. No hepatosplenomegaly. No bruits or masses. Good bowel sounds. Driveline: C/D/I; securement device intact and driveline incorporated Extremities: no cyanosis, clubbing, rash, edema Neuro: alert & orientedx3, cranial nerves grossly intact. moves all 4 extremities w/o difficulty. Affect pleasant  Telemetry: SR 80  Discharge Weight Range: 179 pounds  Discharge Vitals: Blood pressure 119/97, pulse 100, temperature 98.6 F (37 C), temperature source Oral, resp. rate  20, height  (1.626 m), weight 179 lb 10.8 oz (81.5 kg), SpO2 99 %.  Labs: Lab Results  Component Value Date   WBC 2.5* 07/05/2014   HGB 10.6* 07/05/2014   HCT 33.3* 07/05/2014   MCV 78.2 07/05/2014   PLT 235 07/05/2014    Recent Labs Lab 07/06/14 0824  NA 134*  K 3.5  CL 106  CO2 22  BUN 10  CREATININE 1.03  CALCIUM 8.3*  PROT 6.4  BILITOT 1.7*  ALKPHOS 126*  ALT 15  AST 20  GLUCOSE  100*   Lab Results  Component Value Date   CHOL 166 07/20/2013   HDL 63 07/20/2013   LDLCALC 84 07/20/2013   TRIG 96 07/20/2013   BNP (last 3 results)  Recent Labs  03/31/14 0950 05/04/14 1030 05/09/14 1052  PROBNP 634.7* 312.0* 1914.0*    Diagnostic Studies/Procedures   Dg Chest 2 View  07/05/2014   CLINICAL DATA:  Left ventricular assist device with all arms seen running. Evaluate position of the device.  EXAM: CHEST  2 VIEW  COMPARISON:  PA and lateral chest 11/27/2011. Single view of the chest 12/15/2013.  FINDINGS: Left ventricular assist device is in place and unchanged in position. The appearance is stable. Also again seen is an AICD with the lead unchanged in position and intact. The generator is also unremarkable. There is cardiomegaly without edema. The lungs are clear. No pneumothorax or pleural effusion.  IMPRESSION: Stable appearance of left ventricular assist device and AICD back to 11/27/2011.  Cardiomegaly without acute disease.   Electronically Signed   By: Drusilla Kanner M.D.   On: 07/05/2014 09:18    Discharge Medications     Medication List    TAKE these medications        aspirin 325 MG EC tablet  Take 1 tablet (325 mg total) by mouth daily.     atorvastatin 40 MG tablet  Commonly known as:  LIPITOR  Take 1 tablet (40 mg total) by mouth daily.     carvedilol 3.125 MG tablet  Commonly known as:  COREG  Take 1 tablet (3.125 mg total) by mouth 2 (two) times daily with a meal.     clopidogrel 75 MG tablet  Commonly known as:  PLAVIX  Take 1 tablet (75 mg total) by mouth daily with breakfast.     furosemide 20 MG tablet  Commonly known as:  LASIX  Take 1 tablet (20 mg total) by mouth every other day. Or as directed  Start taking on:  07/07/2014     hydrALAZINE 50 MG tablet  Commonly known as:  APRESOLINE  Take 1 tablet (50 mg total) by mouth 3 (three) times daily.     isosorbide mononitrate 60 MG 24 hr tablet  Commonly known as:  IMDUR  Take  1 tablet (60 mg total) by mouth daily.     losartan 50 MG tablet  Commonly known as:  COZAAR  Take 1 tablet (50 mg total) by mouth 2 (two) times daily.     potassium chloride SA 20 MEQ tablet  Commonly known as:  K-DUR,KLOR-CON  Take two tabs daily. May take one extra tab on days you take extra lasix     tamsulosin 0.4 MG Caps capsule  Commonly known as:  FLOMAX  Take 1 capsule (0.4 mg total) by mouth daily.     warfarin 4 MG tablet  Commonly known as:  COUMADIN  Take 1 tablet (4 mg total) by mouth daily.  Hold lasix 1/13 and 1/14 then start 4 mg daily on 07/08/14  Start taking on:  07/08/2014        Disposition   The patient will be discharged in stable condition to home.     Discharge Instructions    ACE Inhibitor / ARB already ordered    Complete by:  As directed      Diet - low sodium heart healthy    Complete by:  As directed      Heart Failure patients record your daily weight using the same scale at the same time of day    Complete by:  As directed      Increase activity slowly    Complete by:  As directed           Follow-up Information    Follow up with Arvilla Meres, MD On 07/12/2014.   Specialty:  Cardiology   Why:  at 11:00 Garage Code [id number]    Contact information:   [address] [place] [address] [phone]         Duration of Discharge Encounter: Greater than 35 minutes   Signed, CLEGG,AMY NP-C  07/06/2014, 12:23 PM

## 2014-07-06 NOTE — Progress Notes (Signed)
ANTICOAGULATION CONSULT NOTE - Follow-Up Consult  Pharmacy Consult for Warfarin Indication: LVAD  Allergies  Allergen Reactions  . Ace Inhibitors Cough  . Lexapro [Escitalopram Oxalate] Other (See Comments)    somnolence    Patient Measurements: Height: 5\' 4"  (162.6 cm) Weight: 179 lb 10.8 oz (81.5 kg) IBW/kg (Calculated) : 59.2 Heparin Dosing Weight: n/a  Vital Signs: Temp: 98.7 F (37.1 C) (01/13 0800) Temp Source: Oral (01/13 0800) Pulse Rate: 100 (01/13 0800)  Labs:  Recent Labs  07/05/14 0855 07/05/14 1530 07/06/14 0244 07/06/14 0824  HGB 10.6*  --   --   --   HCT 33.3*  --   --   --   PLT 235  --   --   --   LABPROT 35.3* 37.6* 38.3*  --   INR 3.49* 3.79* 3.87*  --   CREATININE 1.06  --   --  1.03    Estimated Creatinine Clearance: 81.7 mL/min (by C-G formula based on Cr of 1.03).   Medical History: Past Medical History  Diagnosis Date  . CHF (congestive heart failure)     EF- 10-15  . Medically noncompliant   . Mitral regurgitation   . Tobacco user   . HTN (hypertension)   . AICD (automatic cardioverter/defibrillator) present   . GERD (gastroesophageal reflux disease)   . Substance abuse   . Chronic renal insufficiency   . Syncope   . Thrombus 08/06/2010  . SYSTOLIC HEART FAILURE, CHRONIC 09/22/2008    Qualifier: Diagnosis of  By: Gala Romney, MD, Trixie Dredge   . LV (left ventricular) mural thrombus 01/28/2011  . ICD - IN SITU 09/16/2008    Qualifier: Diagnosis of  By: Wonda Amis    . MITRAL STENOSIS/ INSUFFICIENCY, NON-RHEUMATIC 09/22/2008    Qualifier: Diagnosis of  By: Gala Romney, MD, Trixie Dredge Hepatomegaly 09/16/2008    Qualifier: Diagnosis of  By: Wonda Amis    . High cholesterol 02/26/2012    "at one time"  . Sleep apnea   . Exertional dyspnea 02/26/2012  . History of blood transfusion 08/2011    "when I had heart pump"  . Migraines   . COMMON MIGRAINE 06/14/2009    Qualifier: Diagnosis of  By: Jonny Ruiz MD, Len Blalock    . History of gout 02/26/2012  . Depression 08/11/2013    Pt denies  . Bipolar affective disorder 10/22/2011    pt denies this hx 02/26/2012    Medications:  PTA Coumadin dose 4 mg daily.  Pt reports last dose taken 1/11.    Assessment: 54 yo male with a hx of severe CHF, s/p LVAD placement, and LVAD exchange 3/15.  Admitted today for low-flow alarms since Sunday.  Pharmacy asked to dose Coumadin while he is here.  Today's INR supratherapeutic at 3.87.  CBC okay, no bleeding noted.  Goal of Therapy:  INR 2-3 Monitor platelets by anticoagulation protocol: Yes   Plan:  1. No Coumadin tonight.  If discharged today, recommend no Coumadin tonight or tomorrow night, then resume at previous dose of 4 mg daily.  Would recheck INR Monday. 2. Daily PT/INR.  Tad Moore, BCPS  Clinical Pharmacist Pager [phone]  07/06/2014 10:09 AM

## 2014-07-08 ENCOUNTER — Other Ambulatory Visit (HOSPITAL_COMMUNITY): Payer: Self-pay | Admitting: Infectious Diseases

## 2014-07-08 DIAGNOSIS — Z7901 Long term (current) use of anticoagulants: Secondary | ICD-10-CM

## 2014-07-08 DIAGNOSIS — Z95811 Presence of heart assist device: Secondary | ICD-10-CM

## 2014-07-11 ENCOUNTER — Telehealth (HOSPITAL_COMMUNITY): Payer: Self-pay | Admitting: Infectious Diseases

## 2014-07-11 NOTE — Telephone Encounter (Signed)
Called patient re: clinic visit tomorrow. Reminded him to please bring his power module, Magazine features editor and his patient cable to have Midwife perform the required annual maintenance that is overdue. Verbalized that he would bring the equipment.

## 2014-07-12 ENCOUNTER — Ambulatory Visit (HOSPITAL_COMMUNITY)
Admission: RE | Admit: 2014-07-12 | Discharge: 2014-07-12 | Disposition: A | Discharge status: Home / Self Care | Payer: Medicare Other | Source: Ambulatory Visit | Attending: Internal Medicine | Admitting: Internal Medicine

## 2014-07-12 ENCOUNTER — Ambulatory Visit (HOSPITAL_COMMUNITY): Payer: Self-pay | Admitting: *Deleted

## 2014-07-12 VITALS — BP 116/0 | HR 96 | Ht 64.0 in | Wt 193.6 lb

## 2014-07-12 DIAGNOSIS — I509 Heart failure, unspecified: Secondary | ICD-10-CM

## 2014-07-12 DIAGNOSIS — Z7901 Long term (current) use of anticoagulants: Secondary | ICD-10-CM | POA: Diagnosis not present

## 2014-07-12 DIAGNOSIS — Z5181 Encounter for therapeutic drug level monitoring: Secondary | ICD-10-CM

## 2014-07-12 DIAGNOSIS — I10 Essential (primary) hypertension: Secondary | ICD-10-CM | POA: Diagnosis not present

## 2014-07-12 DIAGNOSIS — Z95811 Presence of heart assist device: Secondary | ICD-10-CM

## 2014-07-12 DIAGNOSIS — I159 Secondary hypertension, unspecified: Secondary | ICD-10-CM

## 2014-07-12 DIAGNOSIS — T829XXD Unspecified complication of cardiac and vascular prosthetic device, implant and graft, subsequent encounter: Secondary | ICD-10-CM

## 2014-07-12 DIAGNOSIS — I5022 Chronic systolic (congestive) heart failure: Secondary | ICD-10-CM

## 2014-07-12 DIAGNOSIS — I5023 Acute on chronic systolic (congestive) heart failure: Secondary | ICD-10-CM

## 2014-07-12 LAB — CBC
HCT: 34.7 % — ABNORMAL LOW (ref 39.0–52.0)
HEMOGLOBIN: 10.9 g/dL — AB (ref 13.0–17.0)
MCH: 24.9 pg — AB (ref 26.0–34.0)
MCHC: 31.4 g/dL (ref 30.0–36.0)
MCV: 79.2 fL (ref 78.0–100.0)
Platelets: 226 10*3/uL (ref 150–400)
RBC: 4.38 MIL/uL (ref 4.22–5.81)
RDW: 18.7 % — AB (ref 11.5–15.5)
WBC: 2.2 10*3/uL — ABNORMAL LOW (ref 4.0–10.5)

## 2014-07-12 LAB — BASIC METABOLIC PANEL
Anion gap: 5 (ref 5–15)
BUN: 9 mg/dL (ref 6–23)
CALCIUM: 9 mg/dL (ref 8.4–10.5)
CO2: 27 mmol/L (ref 19–32)
CREATININE: 1.1 mg/dL (ref 0.50–1.35)
Chloride: 110 mEq/L (ref 96–112)
GFR calc non Af Amer: 76 mL/min — ABNORMAL LOW (ref >90)
GFR, EST AFRICAN AMERICAN: 88 mL/min — AB (ref >90)
GLUCOSE: 108 mg/dL — AB (ref 70–99)
POTASSIUM: 3.6 mmol/L (ref 3.5–5.1)
Sodium: 142 mmol/L (ref 135–145)

## 2014-07-12 LAB — PROTIME-INR
INR: 1.56 — ABNORMAL HIGH (ref 0.00–1.49)
Prothrombin Time: 18.8 seconds — ABNORMAL HIGH (ref 11.6–15.2)

## 2014-07-12 LAB — LACTATE DEHYDROGENASE: LDH: 337 U/L — ABNORMAL HIGH (ref 94–250)

## 2014-07-12 MED ORDER — LOSARTAN POTASSIUM 100 MG PO TABS: 100.0000 mg | ORAL_TABLET | Freq: Every day | ORAL | Status: DC

## 2014-07-12 MED ORDER — ENOXAPARIN SODIUM 80 MG/0.8ML ~~LOC~~ SOLN: 80.0000 mg | Freq: Two times a day (BID) | SUBCUTANEOUS | Status: DC

## 2014-07-12 NOTE — Patient Instructions (Addendum)
1.  Increase Lasix to 20 mg daily. Take 40 mg today (one time only). 2.  Increase Potassium to 2 tablets daily. 3.  Start Lovenox shots 80 mg twice daily. Take Warfarin 8 mg today then 4 mg daily. Re-check INR Friday across the street.  4.  Return to VAD clinic in 3 weeks.

## 2014-07-12 NOTE — Progress Notes (Signed)
Symptom  Yes  No  Details   Angina         x Activity:   Claudication         x How far:   Syncope         x When:   Stroke        x    Orthopnea         x How many pillows: one  PND         x How often:  CPAP      N/A How many hrs:   Pedal edema          x         clears overnight  Abd fullness         x   N&V         x good appetite; eats full meals  Diaphoresis         x When:  Bleeding        x   Urine color    light yellow  SOB         x Activity:  incline  Palpitations         x When:  ICD shock         x   Hospitlizaitons                x When/where/why:    ED visit         x When/where/why:  Other MD         x When/who/why:  Activity           Watching TV  Fluid           No limitations   Diet     No limitations   Vital signs: HR:  80 MAP BP: 110 O2 Sat: 99 Wt: 171.4  lbs   Last wt: 169  lbs Ht: 5'4"  LVAD interrogation reveals:  Speed:  9400 Flow:  4.6 Power:  5.4 PI:  7.0 Alarms: mult low voltage advisories; three "replace back up battery"; and 107 "low flow" alarms on 07/10/14 (from 4 am - 9am);   30 "low flow" alarms on 06/29/14 (am while in bed) Events:  07/12/14 - 17 PI events;  07/11/14 - 15 PI events;  1/17 - 10 PI events;   1/16 - 18 PI events  Fixed speed:  9400 Low speed limit: 8800  Primary Controller:  Replace back up battery in 28 months (03/2016)  [id number]; version V7.23 Back up controller:   Replace back up battery in  18onths (08/2015)  [id number]; version V7.23  Event log downloaded and sent to [employer] for review; contacted our clinical rep in re: to multiple "replace back up battery" advisories. Informed her battery is seated properly. Upon her advisement, 11V battery replaced:  Current 11V battery:  MP [id number] - will return to [employer] for examination  New 11V battery:  SP [id number];  Exp 10/04/15    [id number]  Dr. Gala Romney assessed patient and multiple low flow alarms.  Pt is taking lasix 20 mg every other day as instructed, both he and his  friend Irish Lack (called her to clarify current meds and patient status at home) report weight gain and increased pedal edema daily. Dr. Gala Romney decreased pump speed to 9200. Both primary and backup controller reprogrammed to fixed speed 9200 RPM with low speed limit 8600 RPM.  Past reviews of patient log files by [employer] found system voltages were  lower than expected while patient was connected to his Patient Cable and [employer] has recommended replacing the cable. We have asked patient to bring home equipment to clinic for annual maintenance (not sure when [facility] performed last PM). Pt brought PM today (unable to carry both PM and UBC) for annual PM by our biomed department. Pt will bring UBC next clinic visit.   LVAD exit site:  Well healed and incorporated. The velour is fully implanted at exit site. Pt doing his own dressing changes using Sorbaview dressing; stabilization device is not present.  Pt states he has adequate dressing supplies at home.  I reviewed the LVAD parameters from today, and compared the results to the patient's prior recorded data. The LVAD is functioning within specified parameters.  LVAD interrogation was positive for multiple low voltage advisories, replace back up battery and low flow alarms. LVAD equipment check completed and is in good working order. Back-up equipment present.   Called [facility] to confirm med changes and f/u plan. She verbalized understanding of same.

## 2014-07-14 ENCOUNTER — Telehealth (HOSPITAL_COMMUNITY): Payer: Self-pay | Admitting: *Deleted

## 2014-07-14 NOTE — Telephone Encounter (Signed)
Called pt re: getting INR today instead of tomorrow. Lab will probably be closed due to inclement weather. Pt has no transportation today; informed pt he will need to stay on Lovenox until next INR is 2.0 or greater. Pt states he has supply of lovenox and will continue using until INR check tomorrow (or Monday if lab closed).

## 2014-07-15 ENCOUNTER — Other Ambulatory Visit: Payer: Medicare Other

## 2014-07-18 ENCOUNTER — Other Ambulatory Visit: Payer: Medicare Other

## 2014-07-18 NOTE — Addendum Note (Signed)
Encounter addended by: Dolores Patty, MD on: 07/18/2014 10:56 AM<BR>     Documentation filed: Charges VN, Follow-up Section, LOS Section, Visit Diagnoses, Clinical Notes

## 2014-07-18 NOTE — Progress Notes (Addendum)
Patient ID: [name], male   DOB: 1961-05-12, 54 y.o.   MRN: [id number]  Primary Physician: Dr. Oliver Barre  Primary Cardiologist: Dr. Gala Romney  HPI: [name] is a 54 yo male with a history of severe CHF, NICM s/p LVAD and TVR (09/2011), VT, NSTEMI, LV thrombus, CKD, GERD, stroke and LVAD hemolysis with LVAD exchange (08/2013).   He was admitted to [facility] January 2015 with NSTEMI and RV failure. He had a difficult course that was complicated by embolic right CVA. He was intubated and eventually had a tracheostomy placed 07/28/13. He was transferred to inpatient rehab for extensive rehab and was making progression, however despite aggressive therapy with heparin, coumadin, ASA and Plavix, his LDH remained persistently elevated in the 950 range (was about 1750 on admit). He did not experience any pump dysfunction, however the case was discussed with Dr Allena Katz and [name] at [facility] and it was felt to transfer him down there for possible pump exchange. He underwent a pump exchange 08/31/13. Post op was complicated by bleeding and he received multiple transfusions. He had AKI and had intermittent HD and underwent last HD on 09/24/13. Last Cr on chart 1.9 (10/02/13)  Admitted 5/13-5/18/15 for volume overload and possible hemolysis. LDH 647 and INR 1.69 on admission. Treated with IV lasix and IV heparin. While in the hospital developed some PI events and speed was decreased to 9400. ICD interrogated and showed A flutter and he was placed on IV amiodarone and then transitioned to 200 mg PO BID. He cardioverted to NSR on 5/15. LDH trended down and on discharge was 530 and INR 2.4.  Follow up for Heart Failure and LVAD: Recently admitted with multiple low flow alarms. Diuretics held and given IVF with improvement. Echo showed severe RV dysfunction. Says he feels ok. Does note some swelling. Taking lasix  every other day. Denies SOB, orthopnea or CP.  No dizziness. Weight at home 1stable. . No falls since last visit. Following  a low salt diet and drinking less than 2L a day.   No problems with bleeding or driveline site. No hematuria or dark urine.   SH: Disabled, lives at home with caregiver. No ETOH or smoking FH: Mother living; HTN        Father living; no health issues   No alarms.  Denies driveline trauma, erythema or drainage.  Denies ICD shocks.   Reports taking Coumadin as prescribed and adherence to anticoagulation based dietary restrictions.  Denies bright red blood per rectum or melena, no dark urine or hematuria.     Past Medical History  Diagnosis Date  . CHF (congestive heart failure)     EF- 10-15  . Medically noncompliant   . Mitral regurgitation   . Tobacco user   . HTN (hypertension)   . AICD (automatic cardioverter/defibrillator) present   . GERD (gastroesophageal reflux disease)   . Substance abuse   . Chronic renal insufficiency   . Syncope   . Thrombus 08/06/2010  . SYSTOLIC HEART FAILURE, CHRONIC 09/22/2008    Qualifier: Diagnosis of  By: Gala Romney, MD, Trixie Dredge   . LV (left ventricular) mural thrombus 01/28/2011  . ICD - IN SITU 09/16/2008    Qualifier: Diagnosis of  By: Wonda Amis    . MITRAL STENOSIS/ INSUFFICIENCY, NON-RHEUMATIC 09/22/2008    Qualifier: Diagnosis of  By: Gala Romney, MD, Trixie Dredge Hepatomegaly 09/16/2008    Qualifier: Diagnosis of  By: Wonda Amis    . High cholesterol  02/26/2012    "at one time"  . Sleep apnea   . Exertional dyspnea 02/26/2012  . History of blood transfusion 08/2011    "when I had heart pump"  . Migraines   . COMMON MIGRAINE 06/14/2009    Qualifier: Diagnosis of  By: Jonny Ruiz MD, Len Blalock   . History of gout 02/26/2012  . Depression 08/11/2013    Pt denies  . Bipolar affective disorder 10/22/2011    pt denies this hx 02/26/2012    Current Outpatient Prescriptions  Medication Sig Dispense Refill  . aspirin 325 MG EC tablet Take 1 tablet (325 mg total) by mouth daily. 30 tablet 6  . carvedilol (COREG) 3.125 MG  tablet Take 1 tablet (3.125 mg total) by mouth 2 (two) times daily with a meal. 60 tablet 6  . clopidogrel (PLAVIX) 75 MG tablet Take 1 tablet (75 mg total) by mouth daily with breakfast. 30 tablet 6  . furosemide (LASIX) 20 MG tablet Take 1 tablet (20 mg total) by mouth every other day. Or as directed 60 tablet 6  . hydrALAZINE (APRESOLINE) 50 MG tablet Take 1 tablet (50 mg total) by mouth 3 (three) times daily. 90 tablet 6  . isosorbide mononitrate (IMDUR) 60 MG 24 hr tablet Take 1 tablet (60 mg total) by mouth daily. 30 tablet 6  . losartan (COZAAR) 100 MG tablet Take 1 tablet (100 mg total) by mouth daily. 30 tablet 6  . potassium chloride SA (K-DUR,KLOR-CON) 20 MEQ tablet Take two tabs daily. May take one extra tab on days you take extra lasix (Patient taking differently: Take 20 mEq by mouth once. ) 60 tablet 6  . tamsulosin (FLOMAX) 0.4 MG CAPS capsule Take 1 capsule (0.4 mg total) by mouth daily. 30 capsule 6  . warfarin (COUMADIN) 4 MG tablet Take 1 tablet (4 mg total) by mouth daily. Hold lasix 1/13 and 1/14 then start 4 mg daily on 07/08/14 45 tablet 6  . atorvastatin (LIPITOR) 40 MG tablet Take 1 tablet (40 mg total) by mouth daily. (Patient not taking: Reported on 07/12/2014) 30 tablet 3  . enoxaparin (LOVENOX) 80 MG/0.8ML injection Inject 0.8 mLs (80 mg total) into the skin every 12 (twelve) hours. 10 Syringe 4   No current facility-administered medications for this encounter.   Facility-Administered Medications Ordered in Other Encounters  Medication Dose Route Frequency Provider Last Rate Last Dose  . sodium chloride 0.9 % injection 10-40 mL  10-40 mL Intracatheter PRN [name], MD        Ace inhibitors and Lexapro  REVIEW OF SYSTEMS: All systems negative except as listed in HPI, PMH and Problem list.   LVAD INTERROGATION:   Speed: 9400 Flow: 4.6 Power: 5.4 PI: 7.0 Alarms: mult low voltage advisories; three "replace back up battery"; and 107 "low flow" alarms  on 07/10/14 (from 4 am - 9am); 30 "low flow" alarms on 06/29/14 (am while in bed) Events: 07/12/14 - 17 PI events; 07/11/14 - 15 PI events; 1/17 - 10 PI events; 1/16 - 18 PI events  Fixed speed: 9400 Low speed limit: 8800  Primary Controller: Replace back up battery in 28 months (03/2016) [id number]; version V7.23 Back up controller: Replace back up battery in 18onths (08/2015) [id number]; version V7.23  Event log downloaded and sent to [employer] for review; contacted our clinical rep in re: to multiple "replace back up battery" advisories. Informed her battery is seated properly. Upon her advisement, 11V battery replaced: Current 11V battery: MP [id number] -  will return to [employer] for examination New 11V battery: SP [id number]; Exp 10/04/15  [id number]  I reviewed the LVAD parameters from today, and compared the results to the patient's prior recorded data. No changes to LVAD speed. The LVAD is functioning within specified parameters.  The patient performs LVAD self-test daily.  LVAD interrogation was negative for any significant power changes, alarms or PI events/speed drops.  LVAD equipment check completed and is in good working order.  Back-up equipment present.   LVAD education done on emergency procedures and precautions and reviewed exit site care.    Filed Vitals:   07/12/14 1156  BP: 116/0  Pulse: 96  Height: 5\' 4"  (1.626 m)  Weight: 193 lb 9.6 oz (87.816 kg)  SpO2: 95%    Physical Exam: GENERAL: Well appearing, male who presents to clinic today, friend present and mom present HEENT: normal  NECK: Supple, JVP flat, .2+ bilaterally, no bruits.  No lymphadenopathy or thyromegaly appreciated.   CARDIAC:  Mechanical heart sounds with LVAD hum present, prominent heart sounds LUNGS:  Clear to auscultation bilaterally.  ABDOMEN:  Soft, round, nontender, positive bowel sounds x4.     LVAD exit site: well-healed and incorporated.  No erythema or  drainage.  Stabilization device present and accurately applied. Dressing being changed weekly. EXTREMITIES:  Warm and dry, no cyanosis, clubbing, rash, trace edema L>R NEUROLOGIC:  Alert and oriented x 4.  Gait steady.  No aphasia.  No dysarthria.  Affect pleasant.      ASSESSMENT AND PLAN:   1) Chronic systolic HF: NICM, s/p LVAD implant (08/2011) and subsequent (08/2013) - Has multiple PI events on VAD. Recent echo with severe RV dysfunction. Suspect this may be contributing. Will need to be careful not to overdiurese. Keep lasix to 20mg  every other day. We turned VAD speed down to 9200 - NYHA II symptoms - MAP improved. Continue current meds. -  Reinforced the need and importance of daily weights, a low sodium diet, and fluid restriction (less than 2 L a day). Instructed to call the HF clinic if weight increases more than 3 lbs overnight or 5 lbs in a week.  2) LVAD, s/p (08/2011) and then (08/2013) d/t pump thrombosis - Decrease speed to 9200. Back-up battery changed in Clinic as well.  - Check LDH, CBC, BMET, pro-BNP and INR today. - Not a tx candidate d.t compliance issues.  3) HTN - control improving on current regimen. Stressed need for compliance 4) CVA - Stable.Continues to have left sided weakness. No longer in therapy. Continue atorvastatin 40 mg daily.  6) h/o Atrial Flutter: - now in NSR 7) Anticoagulation management - INR Goal 2.5-3.0. He has had multiple supra theraeputic INRs and has missed multiple INR appointments. Will check INR today and adjust accordingly. Have discussed again the risks of too high or low INR.  8) RV failure - As above. Continue low dose BB.   Reviewed closely with VAD coordinators in Clinic.   F/U 1 month Arvilla Meres MD  10:45 AM

## 2014-07-19 ENCOUNTER — Telehealth (HOSPITAL_COMMUNITY): Payer: Self-pay | Admitting: *Deleted

## 2014-07-19 NOTE — Telephone Encounter (Signed)
Called pt - no anwer; left message. Called [name] and reminded of missed INR appt last Friday and yesterday; friend states parking lot will be "sheet of ice" in the morning. She says patient has been "bleeding" from lovenox shots and stopped them. She thinks he stopped Lovenox yesterday (pt on another phone call and she cannot confirm at this time). Stressed importance of getting INR checked tomorrow if possible.  [name] states she has "asked and asked" for Lasix Rx of 40 mg daily to be sent in.  Reviewed last clinic instructions and reminded her Dr. Gala Romney asked that pt increase his lasix from 20 mg every other day to 20 mg daily with a one time dose of 40 mg.  She says patient had been taking Lasix 20 mg daily for last three weeks (informed her pt did not report daily lasix at last clinic visit). She says patient doesn't know his day to day meds, she fills the pillbox. Asked if she could come with patient to next clinic visit to verify correct meds.  She states patient is not responding well to Lasix 20 mg daily and that patient is gaining weight, swelling, and is SOB. Will update Dr. Gala Romney and call pt/Tenisha back with instructions.

## 2014-07-20 ENCOUNTER — Telehealth (HOSPITAL_COMMUNITY): Payer: Self-pay | Admitting: *Deleted

## 2014-07-20 NOTE — Telephone Encounter (Addendum)
Called and spoke with patient and [name] re: missed INR appt today. Pt "afraid of slipping on ice" and "couldn't get out".  Asked him to get INR tomorrow.  Instructed pt and [name] per Dr. Gala Romney to have pt take Lasix 40 mg daily x 3 days for c/o increased SOB and swelling, then return to 20 mg daily. Asked them to monitor for low flows or PI trending down and call VAD coordinator. Pt and [name] verbalized understanding of same.

## 2014-07-21 ENCOUNTER — Other Ambulatory Visit (INDEPENDENT_AMBULATORY_CARE_PROVIDER_SITE_OTHER): Payer: Medicare Other | Admitting: *Deleted

## 2014-07-21 ENCOUNTER — Telehealth (HOSPITAL_COMMUNITY): Payer: Self-pay | Admitting: *Deleted

## 2014-07-21 DIAGNOSIS — Z7901 Long term (current) use of anticoagulants: Secondary | ICD-10-CM

## 2014-07-21 DIAGNOSIS — Z5181 Encounter for therapeutic drug level monitoring: Secondary | ICD-10-CM

## 2014-07-21 DIAGNOSIS — Z95811 Presence of heart assist device: Secondary | ICD-10-CM

## 2014-07-21 LAB — PROTIME-INR
INR: 3.7 ratio — AB (ref 0.8–1.0)
Prothrombin Time: 39.1 s — ABNORMAL HIGH (ref 9.6–13.1)

## 2014-07-21 NOTE — Addendum Note (Signed)
Addended by: Tonita Phoenix on: 07/21/2014 10:12 AM   Modules accepted: Orders

## 2014-07-21 NOTE — Telephone Encounter (Signed)
Called pt - no answer. Called [facility] and asked that pt hold coumadin dose this evening.  His INR is 3.7. Pt confirmed he is taking 4 mg daily; will consult with PharmD tomorrow and call him back with further warfarin dosing instructions. Asked that he answer the phone for instructions. Both Tenisha and patient verbalized understanding of above.

## 2014-07-22 ENCOUNTER — Other Ambulatory Visit (HOSPITAL_COMMUNITY): Payer: Self-pay | Admitting: Infectious Diseases

## 2014-07-22 ENCOUNTER — Ambulatory Visit (HOSPITAL_COMMUNITY): Payer: Self-pay | Admitting: Infectious Diseases

## 2014-07-22 ENCOUNTER — Encounter: Payer: Self-pay | Admitting: *Deleted

## 2014-07-22 DIAGNOSIS — Z7901 Long term (current) use of anticoagulants: Secondary | ICD-10-CM

## 2014-07-26 ENCOUNTER — Other Ambulatory Visit: Payer: Medicare Other

## 2014-08-02 ENCOUNTER — Telehealth (HOSPITAL_COMMUNITY): Payer: Self-pay | Admitting: Infectious Diseases

## 2014-08-02 ENCOUNTER — Encounter (HOSPITAL_COMMUNITY): Payer: Medicare Other

## 2014-08-02 NOTE — Telephone Encounter (Signed)
Called re: missed INR appointments last week. Message left instructing him to please call back

## 2014-08-04 ENCOUNTER — Encounter (HOSPITAL_COMMUNITY): Payer: Medicare Other

## 2014-08-05 ENCOUNTER — Telehealth (HOSPITAL_COMMUNITY): Payer: Self-pay | Admitting: Infectious Diseases

## 2014-08-05 NOTE — Telephone Encounter (Signed)
Called [place] and left message 08/04/14 @ 0900 reminding him of his appointment at 11:00 and to bring his battery charger for his over due PM. No answer and patient was a no show in clinic yesterday. Called his caregiver Irish Lack today to get in touch with [place]. He still does not have transportation and is trying to set himself up with [employer]. Unable to come to clinic until sometime next week due to care trouble and it being too cold out. Rescheduled for Thursday 08/11/14 @ 11:00 for F/U visit in VAD Clinic.

## 2014-08-10 ENCOUNTER — Telehealth: Payer: Self-pay | Admitting: Licensed Clinical Social Worker

## 2014-08-10 ENCOUNTER — Other Ambulatory Visit (HOSPITAL_COMMUNITY): Payer: Self-pay | Admitting: Infectious Diseases

## 2014-08-10 DIAGNOSIS — Z7901 Long term (current) use of anticoagulants: Secondary | ICD-10-CM

## 2014-08-10 DIAGNOSIS — Z95811 Presence of heart assist device: Secondary | ICD-10-CM

## 2014-08-10 NOTE — Telephone Encounter (Signed)
CSW informed that patient is having difficulties with transportation to clinic appointment tomorrow. CSW arranged cab to assist and will follow up with patient tomorrow with transport options ([employer] application). Patient verbalizes understanding and will look out for cab. Lasandra Beech, LCSW [phone]

## 2014-08-11 ENCOUNTER — Ambulatory Visit (HOSPITAL_COMMUNITY): Payer: Self-pay | Admitting: *Deleted

## 2014-08-11 ENCOUNTER — Ambulatory Visit (HOSPITAL_COMMUNITY)
Admission: RE | Admit: 2014-08-11 | Discharge: 2014-08-11 | Disposition: A | Discharge status: Home / Self Care | Payer: Medicare Other | Source: Ambulatory Visit | Attending: Cardiology | Admitting: Cardiology

## 2014-08-11 ENCOUNTER — Encounter: Payer: Self-pay | Admitting: Licensed Clinical Social Worker

## 2014-08-11 VITALS — BP 120/0 | HR 70 | Ht 64.0 in | Wt 184.2 lb

## 2014-08-11 DIAGNOSIS — I4892 Unspecified atrial flutter: Secondary | ICD-10-CM | POA: Insufficient documentation

## 2014-08-11 DIAGNOSIS — K219 Gastro-esophageal reflux disease without esophagitis: Secondary | ICD-10-CM | POA: Insufficient documentation

## 2014-08-11 DIAGNOSIS — N189 Chronic kidney disease, unspecified: Secondary | ICD-10-CM | POA: Insufficient documentation

## 2014-08-11 DIAGNOSIS — F319 Bipolar disorder, unspecified: Secondary | ICD-10-CM | POA: Diagnosis not present

## 2014-08-11 DIAGNOSIS — Z95810 Presence of automatic (implantable) cardiac defibrillator: Secondary | ICD-10-CM | POA: Insufficient documentation

## 2014-08-11 DIAGNOSIS — I639 Cerebral infarction, unspecified: Secondary | ICD-10-CM

## 2014-08-11 DIAGNOSIS — Z79899 Other long term (current) drug therapy: Secondary | ICD-10-CM | POA: Diagnosis not present

## 2014-08-11 DIAGNOSIS — Z7982 Long term (current) use of aspirin: Secondary | ICD-10-CM | POA: Diagnosis not present

## 2014-08-11 DIAGNOSIS — G4730 Sleep apnea, unspecified: Secondary | ICD-10-CM | POA: Diagnosis not present

## 2014-08-11 DIAGNOSIS — Z7902 Long term (current) use of antithrombotics/antiplatelets: Secondary | ICD-10-CM | POA: Insufficient documentation

## 2014-08-11 DIAGNOSIS — I5022 Chronic systolic (congestive) heart failure: Secondary | ICD-10-CM | POA: Diagnosis not present

## 2014-08-11 DIAGNOSIS — I129 Hypertensive chronic kidney disease with stage 1 through stage 4 chronic kidney disease, or unspecified chronic kidney disease: Secondary | ICD-10-CM | POA: Diagnosis not present

## 2014-08-11 DIAGNOSIS — Z7901 Long term (current) use of anticoagulants: Secondary | ICD-10-CM

## 2014-08-11 DIAGNOSIS — I10 Essential (primary) hypertension: Secondary | ICD-10-CM

## 2014-08-11 DIAGNOSIS — I159 Secondary hypertension, unspecified: Secondary | ICD-10-CM

## 2014-08-11 DIAGNOSIS — Z8673 Personal history of transient ischemic attack (TIA), and cerebral infarction without residual deficits: Secondary | ICD-10-CM | POA: Insufficient documentation

## 2014-08-11 DIAGNOSIS — N529 Male erectile dysfunction, unspecified: Secondary | ICD-10-CM | POA: Insufficient documentation

## 2014-08-11 DIAGNOSIS — Z95811 Presence of heart assist device: Secondary | ICD-10-CM

## 2014-08-11 DIAGNOSIS — I252 Old myocardial infarction: Secondary | ICD-10-CM | POA: Insufficient documentation

## 2014-08-11 DIAGNOSIS — I5023 Acute on chronic systolic (congestive) heart failure: Secondary | ICD-10-CM

## 2014-08-11 LAB — CBC
HCT: 34.6 % — ABNORMAL LOW (ref 39.0–52.0)
HEMOGLOBIN: 11 g/dL — AB (ref 13.0–17.0)
MCH: 24.4 pg — AB (ref 26.0–34.0)
MCHC: 31.8 g/dL (ref 30.0–36.0)
MCV: 76.9 fL — AB (ref 78.0–100.0)
PLATELETS: 247 10*3/uL (ref 150–400)
RBC: 4.5 MIL/uL (ref 4.22–5.81)
RDW: 17.5 % — ABNORMAL HIGH (ref 11.5–15.5)
WBC: 2.5 10*3/uL — ABNORMAL LOW (ref 4.0–10.5)

## 2014-08-11 LAB — BASIC METABOLIC PANEL
Anion gap: 6 (ref 5–15)
BUN: 13 mg/dL (ref 6–23)
CO2: 24 mmol/L (ref 19–32)
CREATININE: 1.12 mg/dL (ref 0.50–1.35)
Calcium: 9.3 mg/dL (ref 8.4–10.5)
Chloride: 108 mmol/L (ref 96–112)
GFR calc non Af Amer: 74 mL/min — ABNORMAL LOW (ref >90)
GFR, EST AFRICAN AMERICAN: 86 mL/min — AB (ref >90)
Glucose, Bld: 100 mg/dL — ABNORMAL HIGH (ref 70–99)
Potassium: 4 mmol/L (ref 3.5–5.1)
Sodium: 138 mmol/L (ref 135–145)

## 2014-08-11 LAB — PROTIME-INR
INR: 2.46 — ABNORMAL HIGH (ref 0.00–1.49)
PROTHROMBIN TIME: 26.8 s — AB (ref 11.6–15.2)

## 2014-08-11 LAB — LACTATE DEHYDROGENASE: LDH: 292 U/L — ABNORMAL HIGH (ref 94–250)

## 2014-08-11 MED ORDER — HYDRALAZINE HCL 100 MG PO TABS: 100.0000 mg | ORAL_TABLET | Freq: Three times a day (TID) | ORAL | Status: DC

## 2014-08-11 MED ORDER — FUROSEMIDE 20 MG PO TABS: 20.0000 mg | ORAL_TABLET | ORAL | Status: DC

## 2014-08-11 NOTE — Progress Notes (Signed)
Symptom  Yes  No  Details   Angina         x Activity:   Claudication         x How far:   Syncope         x When:   Stroke        x    Orthopnea         x How many pillows: one  PND         x How often:  CPAP      N/A How many hrs:   Pedal edema                 x    Abd fullness         x   N&V         x good appetite; eats full meals  Diaphoresis         x When:  Bleeding        x   Urine color    light yellow  SOB        x        Activity:  incline  Palpitations         x When:  ICD shock         x   Hospitlizaitons                x When/where/why:    ED visit         x When/where/why:  Other MD         x When/who/why:  Activity           Watching TV  Fluid           No limitations   Diet     No limitations   Vital signs: HR:  70 MAP BP: doppler 120; auto cuff 138/85 (121) O2 Sat: 97 Wt: 184.2   lbs   Last wt: 193.6   lbs Ht: 5'4"  LVAD interrogation reveals:  Speed:  9200 Flow:  3.0 Power:  4.4 PI:  6.8 Alarms:  08/04/14  16 low flow alarms   Events:  0 - 10 PI events; 07/01/14 24 PI events Fixed speed:  9400 Low speed limit: 8800  Primary Controller:  Replace back up battery in 27 months (03/2016)  [id number]; version V7.23 Back up controller:   Replace back up battery in  19 months (08/2015)  [id number]; version V7.23    LVAD exit site:  Well healed and incorporated. The velour is fully implanted at exit site. Pt doing his own dressing changes using Sorbaview dressing; stabilization device is not present.  Dressing supplies provided.  I reviewed the LVAD parameters from today, and compared the results to the patient's prior recorded data. The LVAD is functioning within specified parameters.  LVAD interrogation was negative for any significant power changes;  few PI events/speed drops present which is consistent with the patient's history.  Pt did have 16 low flow alarms on 08/04/14 during hours of 8:40 am - 11:30 am; pt was awake, but denies hearing any alarms at that  time.  LVAD equipment check completed and is in good working order. Back-up equipment present.

## 2014-08-11 NOTE — Progress Notes (Signed)
Patient ID: [name], male   DOB: 1960/08/10, 54 y.o.   MRN: [id number]  Primary Physician: Dr. Oliver Barre  Primary Cardiologist: Dr. 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   He was admitted to [facility] January 2015 with NSTEMI and RV failure. He had a difficult course that was complicated by embolic right CVA. He was intubated and eventually had a tracheostomy placed 07/28/13. He was transferred to inpatient rehab for extensive rehab and was making progression, however despite aggressive therapy with heparin, coumadin, ASA and Plavix, his LDH remained persistently elevated in the 950 range (was about 1750 on admit). He did not experience any pump dysfunction, however the case was discussed with Dr Allena Katz and [name] at [facility] and it was felt to transfer him down there for possible pump exchange. He underwent a pump exchange 08/31/13. Post op was complicated by bleeding and he received multiple transfusions. He had AKI and had intermittent HD and underwent last HD on 09/24/13. Last Cr on chart 1.9 (10/02/13)  Admitted 5/13-5/18/15 for volume overload and possible hemolysis. LDH 647 and INR 1.69 on admission. Treated with IV lasix and IV heparin. While in the hospital developed some PI events and speed was decreased to 9400. ICD interrogated and showed A flutter and he was placed on IV amiodarone and then transitioned to 200 mg PO BID. He cardioverted to NSR on 5/15. LDH trended down and on discharge was 530 and INR 2.4.  Recently admitted with multiple low flow alarms. Diuretics held and given IVF with improvement. Echo showed severe RV dysfunction  Follow up for Heart Failure and LVAD: Doing ok. Lasix back up to daily (was taking every day) Says he feels ok. Denies swelling. Breathing ok. No orthopnea or CP.  No dizziness. Weight at home stable. .No falls.  INR has  been therapeutic. Asking for viagra for ED.   No problems with bleeding or driveline site. No hematuria or dark urine.   SH: Disabled, lives at home with caregiver. No ETOH or smoking FH: Mother living; HTN        Father living; no health issues   No alarms.  Denies driveline trauma, erythema or drainage.  Denies ICD shocks.   Reports taking Coumadin as prescribed and adherence to anticoagulation based dietary restrictions.  Denies bright red blood per rectum or melena, no dark urine or hematuria.     Past Medical History  Diagnosis Date  . CHF (congestive heart failure)     EF- 10-15  . Medically noncompliant   . Mitral regurgitation   . Tobacco user   . HTN (hypertension)   . AICD (automatic cardioverter/defibrillator) present   . GERD (gastroesophageal reflux disease)   . Substance abuse   . Chronic renal insufficiency   . Syncope   . Thrombus 08/06/2010  . SYSTOLIC HEART FAILURE, CHRONIC 09/22/2008    Qualifier: Diagnosis of  By: Gala Romney, MD, Trixie Dredge   . LV (left ventricular) mural thrombus 01/28/2011  . ICD - IN SITU 09/16/2008    Qualifier: Diagnosis of  By: Wonda Amis    . MITRAL STENOSIS/ INSUFFICIENCY, NON-RHEUMATIC 09/22/2008    Qualifier: Diagnosis of  By: Gala Romney, MD, Trixie Dredge Hepatomegaly 09/16/2008    Qualifier: Diagnosis of  By: Wonda Amis    . High cholesterol 02/26/2012    "  at one time"  . Sleep apnea   . Exertional dyspnea 02/26/2012  . History of blood transfusion 08/2011    "when I had heart pump"  . Migraines   . COMMON MIGRAINE 06/14/2009    Qualifier: Diagnosis of  By: Jonny Ruiz MD, Len Blalock   . History of gout 02/26/2012  . Depression 08/11/2013    Pt denies  . Bipolar affective disorder 10/22/2011    pt denies this hx 02/26/2012    Current Outpatient Prescriptions  Medication Sig Dispense Refill  . aspirin 325 MG EC tablet Take 1 tablet (325 mg total) by mouth daily. 30 tablet 6  . carvedilol (COREG) 3.125 MG tablet  Take 1 tablet (3.125 mg total) by mouth 2 (two) times daily with a meal. 60 tablet 6  . clopidogrel (PLAVIX) 75 MG tablet Take 1 tablet (75 mg total) by mouth daily with breakfast. 30 tablet 6  . furosemide (LASIX) 20 MG tablet Take 1 tablet (20 mg total) by mouth every other day. Or as directed (Patient taking differently: Take 20 mg by mouth every other day. Pt taking daily) 60 tablet 6  . hydrALAZINE (APRESOLINE) 50 MG tablet Take 1 tablet (50 mg total) by mouth 3 (three) times daily. 90 tablet 6  . isosorbide mononitrate (IMDUR) 60 MG 24 hr tablet Take 1 tablet (60 mg total) by mouth daily. 30 tablet 6  . losartan (COZAAR) 100 MG tablet Take 1 tablet (100 mg total) by mouth daily. 30 tablet 6  . potassium chloride SA (K-DUR,KLOR-CON) 20 MEQ tablet Take two tabs daily. May take one extra tab on days you take extra lasix (Patient taking differently: 20 mEq daily. Take two tabs daily. May take one extra tab on days you take extra lasix) 60 tablet 6  . tamsulosin (FLOMAX) 0.4 MG CAPS capsule Take 1 capsule (0.4 mg total) by mouth daily. 30 capsule 6  . warfarin (COUMADIN) 4 MG tablet Take 1 tablet (4 mg total) by mouth daily. Hold lasix 1/13 and 1/14 then start 4 mg daily on 07/08/14 45 tablet 6  . atorvastatin (LIPITOR) 40 MG tablet Take 1 tablet (40 mg total) by mouth daily. (Patient not taking: Reported on 08/11/2014) 30 tablet 3  . enoxaparin (LOVENOX) 80 MG/0.8ML injection Inject 0.8 mLs (80 mg total) into the skin every 12 (twelve) hours. (Patient not taking: Reported on 08/11/2014) 10 Syringe 4   No current facility-administered medications for this encounter.   Facility-Administered Medications Ordered in Other Encounters  Medication Dose Route Frequency Provider Last Rate Last Dose  . sodium chloride 0.9 % injection 10-40 mL  10-40 mL Intracatheter PRN [name], MD        Ace inhibitors and Lexapro  REVIEW OF SYSTEMS: All systems negative except as listed in HPI, PMH and Problem  list.   LVAD INTERROGATION:   Speed: 9200 Flow: 3.0 Power: 4.4 PI: 6.8 Alarms: 08/04/14 16 low flow alarms  Events: 0 - 10 PI events; 07/01/14 24 PI events Fixed speed: 9400 Low speed limit: 8800  Primary Controller: Replace back up battery in 27 months (03/2016) [id number]; version V7.23 Back up controller: Replace back up battery in 19 months (08/2015) PC [id number]; version V7.23  I reviewed the LVAD parameters from today, and compared the results to the patient's prior recorded data. No changes to LVAD speed. The LVAD is functioning within specified parameters.  The patient performs LVAD self-test daily.  LVAD interrogation was negative for any significant power changes, alarms or PI  events/speed drops.  LVAD equipment check completed and is in good working order.  Back-up equipment present.   LVAD education done on emergency procedures and precautions and reviewed exit site care.    Filed Vitals:   08/11/14 1131  BP: 120/0  Pulse: 70  Height:  (1.626 m)  Weight: 184 lb 3.2 oz (83.553 kg)  SpO2: 97%    Physical Exam: GENERAL: Well appearing, male who presents to clinic today, friend present and mom present HEENT: normal  NECK: Supple, JVP flat, .2+ bilaterally, no bruits.  No lymphadenopathy or thyromegaly appreciated.   CARDIAC:  Mechanical heart sounds with LVAD hum present, prominent heart sounds LUNGS:  Clear to auscultation bilaterally.  ABDOMEN:  Soft, round, nontender, positive bowel sounds x4.     LVAD exit site: well-healed and incorporated.  No erythema or drainage.  Stabilization device present and accurately applied. Dressing being changed weekly. EXTREMITIES:  Warm and dry, no cyanosis, clubbing, rash, trace edema L>R NEUROLOGIC:  Alert and oriented x 4.  Gait steady.  No aphasia.  No dysarthria.  Affect pleasant.      ASSESSMENT AND PLAN:   1) Chronic systolic HF: NICM, s/p LVAD implant (08/2011) and subsequent (08/2013) - Doing well. Volume  status looks good. Ok to continue current lasix doing. If PI dropping can cut back to qod.  - NYHA II symptoms - MAP up will increase hydralazine to 100 tid  -  Reinforced the need and importance of daily weights, a low sodium diet, and fluid restriction (less than 2 L a day). Instructed to call the HF clinic if weight increases more than 3 lbs overnight or 5 lbs in a week.  2) LVAD, s/p (08/2011) and then (08/2013) d/t pump thrombosis - Keep speed at 9200.  - Check LDH, CBC, BMET, pro-BNP and INR today. - Not a tx candidate d.t compliance issues.  3) HTN - increasing hydralazine 4) CVA - Stable.Continues to have left sided weakness. No longer in therapy. Continue atorvastatin 40 mg daily.  6) h/o Atrial Flutter: - now in NSR 7) Anticoagulation management - INR Goal 2.5-3.0. He has had multiple supra theraeputic INRs and has missed multiple INR appointments. Will check INR today and adjust accordingly. Have discussed again the risks of too high or low INR.  8) RV failure - As above. Continue low dose BB.  9) Erectile dysfunction - No PDE-5 due to Imdur therapy  F/U 1 month  Arvilla Meres MD  12:07 PM

## 2014-08-11 NOTE — Patient Instructions (Addendum)
1.  Increase Hydralazine to 100 mg three times daily. 2.  Increase Lasix to 20 mg daily. 2.  Will call you with INR results and coumadin dosing and next INR f/u. 3.  Return to VAD clinic in 4 weeks.

## 2014-08-11 NOTE — Progress Notes (Signed)
CSW referred to assist with transportation needs. Patient reports that his car is often in need of repair and he has limited options. CSW discussed [employer] and completed an application with patient. CSW also discussed follow up with paramedicine program and assistance provided to patient to be successful with his healthcare. Patient open to paramedicine and will follow up with [employer] application. CSW will continue to be available to patient as needed. Lasandra Beech, LCSW [phone]

## 2014-08-12 ENCOUNTER — Other Ambulatory Visit (HOSPITAL_COMMUNITY): Payer: Self-pay | Admitting: Infectious Diseases

## 2014-08-12 DIAGNOSIS — Z95811 Presence of heart assist device: Secondary | ICD-10-CM

## 2014-08-12 DIAGNOSIS — I6312 Cerebral infarction due to embolism of basilar artery: Secondary | ICD-10-CM

## 2014-08-12 MED ORDER — CLOPIDOGREL BISULFATE 75 MG PO TABS: 75.0000 mg | ORAL_TABLET | Freq: Every day | ORAL | Status: DC

## 2014-08-12 MED ORDER — LOSARTAN POTASSIUM 100 MG PO TABS: 100.0000 mg | ORAL_TABLET | Freq: Every day | ORAL | Status: DC

## 2014-08-26 ENCOUNTER — Other Ambulatory Visit: Payer: Medicare Other

## 2014-08-31 ENCOUNTER — Telehealth (HOSPITAL_COMMUNITY): Payer: Self-pay | Admitting: *Deleted

## 2014-08-31 NOTE — Telephone Encounter (Signed)
Pt called re:  Missed INR message.  Informed pt he was due for INR 08/26/14 and he missed appt; he will "try" to get to lab tomorrow. Stressed importance of close f/u of INR. Pt verbalized understanding of same.

## 2014-09-01 ENCOUNTER — Other Ambulatory Visit: Payer: Medicare Other

## 2014-09-01 ENCOUNTER — Telehealth (HOSPITAL_COMMUNITY): Payer: Self-pay | Admitting: Infectious Diseases

## 2014-09-01 NOTE — Telephone Encounter (Signed)
Called re: second missed INR appointment (was supposed to come on 3/4). He is unable to make it until Monday. I asked him what the progress was with [employer] and he reports that they "won't call him back." I told him that we will see if we can provide some assistance to him regarding setting that up so he can get reliable transportation to the lab and clinic as he needs. Reinforced the importance of monitoring his INR at least every 2 weeks and he is now past 3 weeks.

## 2014-09-02 ENCOUNTER — Telehealth: Payer: Self-pay

## 2014-09-05 ENCOUNTER — Other Ambulatory Visit: Payer: Medicare Other

## 2014-09-05 NOTE — Telephone Encounter (Signed)
Late entry of call placed 07/04/2014 and patient declined flu vaccine. 

## 2014-09-06 ENCOUNTER — Telehealth: Payer: Self-pay | Admitting: Licensed Clinical Social Worker

## 2014-09-06 NOTE — Telephone Encounter (Signed)
CSW received referral to follow up with patient regarding [employer] application. CSW contacted patient's home and spoke with caregiver Irish Lack. CSW informed of clinic appointment on 09/08/14 @11am  and CSW will meet with patient at that time. CSW encouraged patient to bring any follow up mail received from [employer]. CSW encouraged acceptance of paramedicine program to assist patient with compliance with medical regimen at home. Caregiver will discuss further with patient. CSW will continue to follow and meet with patient on next clinic appointment. Lasandra Beech, LCSW [phone]

## 2014-09-08 ENCOUNTER — Ambulatory Visit (HOSPITAL_COMMUNITY): Payer: Self-pay | Admitting: *Deleted

## 2014-09-08 ENCOUNTER — Ambulatory Visit (HOSPITAL_COMMUNITY)
Admission: RE | Admit: 2014-09-08 | Discharge: 2014-09-08 | Disposition: A | Discharge status: Home / Self Care | Payer: Medicare Other | Source: Ambulatory Visit | Attending: Internal Medicine | Admitting: Internal Medicine

## 2014-09-08 ENCOUNTER — Other Ambulatory Visit (HOSPITAL_COMMUNITY): Payer: Self-pay | Admitting: *Deleted

## 2014-09-08 ENCOUNTER — Encounter (HOSPITAL_COMMUNITY): Payer: Self-pay | Admitting: Vascular Surgery

## 2014-09-08 VITALS — BP 85/0 | HR 77 | Ht 64.0 in | Wt 182.8 lb

## 2014-09-08 DIAGNOSIS — Z95811 Presence of heart assist device: Secondary | ICD-10-CM

## 2014-09-08 DIAGNOSIS — Z7901 Long term (current) use of anticoagulants: Secondary | ICD-10-CM | POA: Diagnosis not present

## 2014-09-08 DIAGNOSIS — I5022 Chronic systolic (congestive) heart failure: Secondary | ICD-10-CM

## 2014-09-08 DIAGNOSIS — R0600 Dyspnea, unspecified: Secondary | ICD-10-CM | POA: Diagnosis not present

## 2014-09-08 DIAGNOSIS — I471 Supraventricular tachycardia: Secondary | ICD-10-CM | POA: Diagnosis not present

## 2014-09-08 DIAGNOSIS — I159 Secondary hypertension, unspecified: Secondary | ICD-10-CM | POA: Diagnosis not present

## 2014-09-08 DIAGNOSIS — N529 Male erectile dysfunction, unspecified: Secondary | ICD-10-CM

## 2014-09-08 DIAGNOSIS — Z48812 Encounter for surgical aftercare following surgery on the circulatory system: Secondary | ICD-10-CM | POA: Insufficient documentation

## 2014-09-08 LAB — BASIC METABOLIC PANEL
Anion gap: 7 (ref 5–15)
BUN: 15 mg/dL (ref 6–23)
CHLORIDE: 106 mmol/L (ref 96–112)
CO2: 25 mmol/L (ref 19–32)
CREATININE: 1.09 mg/dL (ref 0.50–1.35)
Calcium: 9.2 mg/dL (ref 8.4–10.5)
GFR calc Af Amer: 89 mL/min — ABNORMAL LOW (ref >90)
GFR calc non Af Amer: 77 mL/min — ABNORMAL LOW (ref >90)
Glucose, Bld: 87 mg/dL (ref 70–99)
Potassium: 4.4 mmol/L (ref 3.5–5.1)
Sodium: 138 mmol/L (ref 135–145)

## 2014-09-08 LAB — PROTIME-INR
INR: 2.76 — ABNORMAL HIGH (ref 0.00–1.49)
Prothrombin Time: 29.4 seconds — ABNORMAL HIGH (ref 11.6–15.2)

## 2014-09-08 LAB — CBC
HCT: 36.3 % — ABNORMAL LOW (ref 39.0–52.0)
HEMOGLOBIN: 11.5 g/dL — AB (ref 13.0–17.0)
MCH: 25.1 pg — AB (ref 26.0–34.0)
MCHC: 31.7 g/dL (ref 30.0–36.0)
MCV: 79.3 fL (ref 78.0–100.0)
PLATELETS: 219 10*3/uL (ref 150–400)
RBC: 4.58 MIL/uL (ref 4.22–5.81)
RDW: 18.8 % — ABNORMAL HIGH (ref 11.5–15.5)
WBC: 2.7 10*3/uL — ABNORMAL LOW (ref 4.0–10.5)

## 2014-09-08 LAB — BRAIN NATRIURETIC PEPTIDE: B Natriuretic Peptide: 264.9 pg/mL — ABNORMAL HIGH (ref 0.0–100.0)

## 2014-09-08 LAB — LACTATE DEHYDROGENASE: LDH: 286 U/L — ABNORMAL HIGH (ref 94–250)

## 2014-09-08 MED ORDER — HYDRALAZINE HCL 50 MG PO TABS: 50.0000 mg | ORAL_TABLET | Freq: Three times a day (TID) | ORAL | Status: DC

## 2014-09-08 MED ORDER — TADALAFIL 10 MG PO TABS: 10.0000 mg | ORAL_TABLET | Freq: Every day | ORAL | Status: DC | PRN

## 2014-09-08 NOTE — Patient Instructions (Signed)
1.  Stop Imdur 60 mg daily. 2.  May start Cialis 10 mg as needed (BUT DO NOT TAKE WITH IMDUR) 3.  Will call you with INR results and coumadin dosing. 4.  Return to VAD clinic in two months.

## 2014-09-08 NOTE — Progress Notes (Signed)
Symptom  Yes  No  Details   Angina         x Activity:   Claudication         x How far:  Right leg with incline  Syncope         x When:  With Hyedralazine 100 tid; stopped when decreased to 50 mg tid  Stroke        x    Orthopnea         x How many pillows: one  PND        x        How often:  Once week  CPAP      N/A How many hrs:   Pedal edema                 x    Abd fullness        x         "always tight"  N&V         x Fair appetite; skips meals - "not hungry" every other day  Diaphoresis         x When:  Bleeding        x   Urine color    light yellow  SOB        x        Activity:  Incline; more fatigue in legs than SOB  Palpitations         x When:  ICD shock         x   Hospitlizaitons                x When/where/why:    ED visit         x When/where/why:  Other MD         x When/who/why:  Activity           Watching TV; 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)  Fluid           No limitations   Diet     No limitations   Vital signs: HR:  77 Auto cuff:  100/77 (85) O2 Sat: 98 Wt: 182.8   lbs   Last wt: 184.2   lbs Ht: 5'4"  LVAD interrogation reveals:  Speed:  9200 Flow:  3.5 Power:  4.6 PI:  5.4 Alarms:  none Events:  0 - 10 PI events Fixed speed:  9200 Low speed limit: 8600 Primary Controller:  Replace back up battery in 26 months (03/2016)  [id number]; version V7.23 Back up controller:   Replace back up battery in  18 months (08/2015)  PC [id number]; version V7.23    LVAD exit site:   VAD dressing removed and site care performed using aseptic technique per patient.  Patient cannot don sterile gloves (limited functional capacity s/p CVA). Pt will not let caregiver Lajuana Matte) perform dressing changes; advised patient to wash hands thoroughly prior to procedure and keep items as clean as possible.   Drive line exit site cleaned with Chlora prep applicators x 2, allowed to dry, and Sorbaview dressing with Biopatch on exit site re-applied.   The velour is fully implanted at exit site, with complete tissue ingrowth noted. Site with old bloody drainage noted; no tenderness, drainage, or foul odor noted. Drive line anchor re-applied. Pt denies fever or chills. Driveline dressing is being changed weekly by patient. Pt admits to his belt attachment device broke, has had  to carry controller around neck and admits to more "pulling at site".  New controller belt holder obtained and fitted on patient.  Dressing supplies for home use provided. Caregiver denies any alarms or VAD equipment issues. Back up equipment present.   Pt did not bring meds to clinic and is unaware of what he takes; called [place] (caregiver) and confirmed current meds.   Offered to perform weekly dressing changes for patient - he has transportation issues (no longer has a car) and is unable to make it to clinic or to make most of his INR appts. Admits to difficulty in using public transportation (has to walk 3.5 blocks from home to bus stop), but is working 3.5 hrs/day for 5 days week at [employer].  Dr. Haroldine Laws spoke with patient re: home INR monitor and importance of adherence to weekly INR checks, calling results, and responding to VAD coordinators calling him with coumadin dose changes.  He has been unavailable by phone in the past and does not return phone calls.  Will investigate home INR monitor per Dr. Clayborne Dana approval.  Raquel Sarna, LSW met with patient re: transportation issues.    Called taxi for patient and assisted to wait area via wheelchair.  Pt BP controlled on Hydralazine 50 mg tid (pt tried higher dose of 100 mg tid and experienced dizziness).

## 2014-09-15 IMAGING — CR DG CHEST 1V PORT
1 series · 1 of 1 positions shown · non-contrast
Comparison: DG CHEST 1V PORT dated 07/28/2013;

CLINICAL DATA: Airspace opacity in the lung bases.

EXAM:
PORTABLE CHEST - 1 VIEW

[AP]
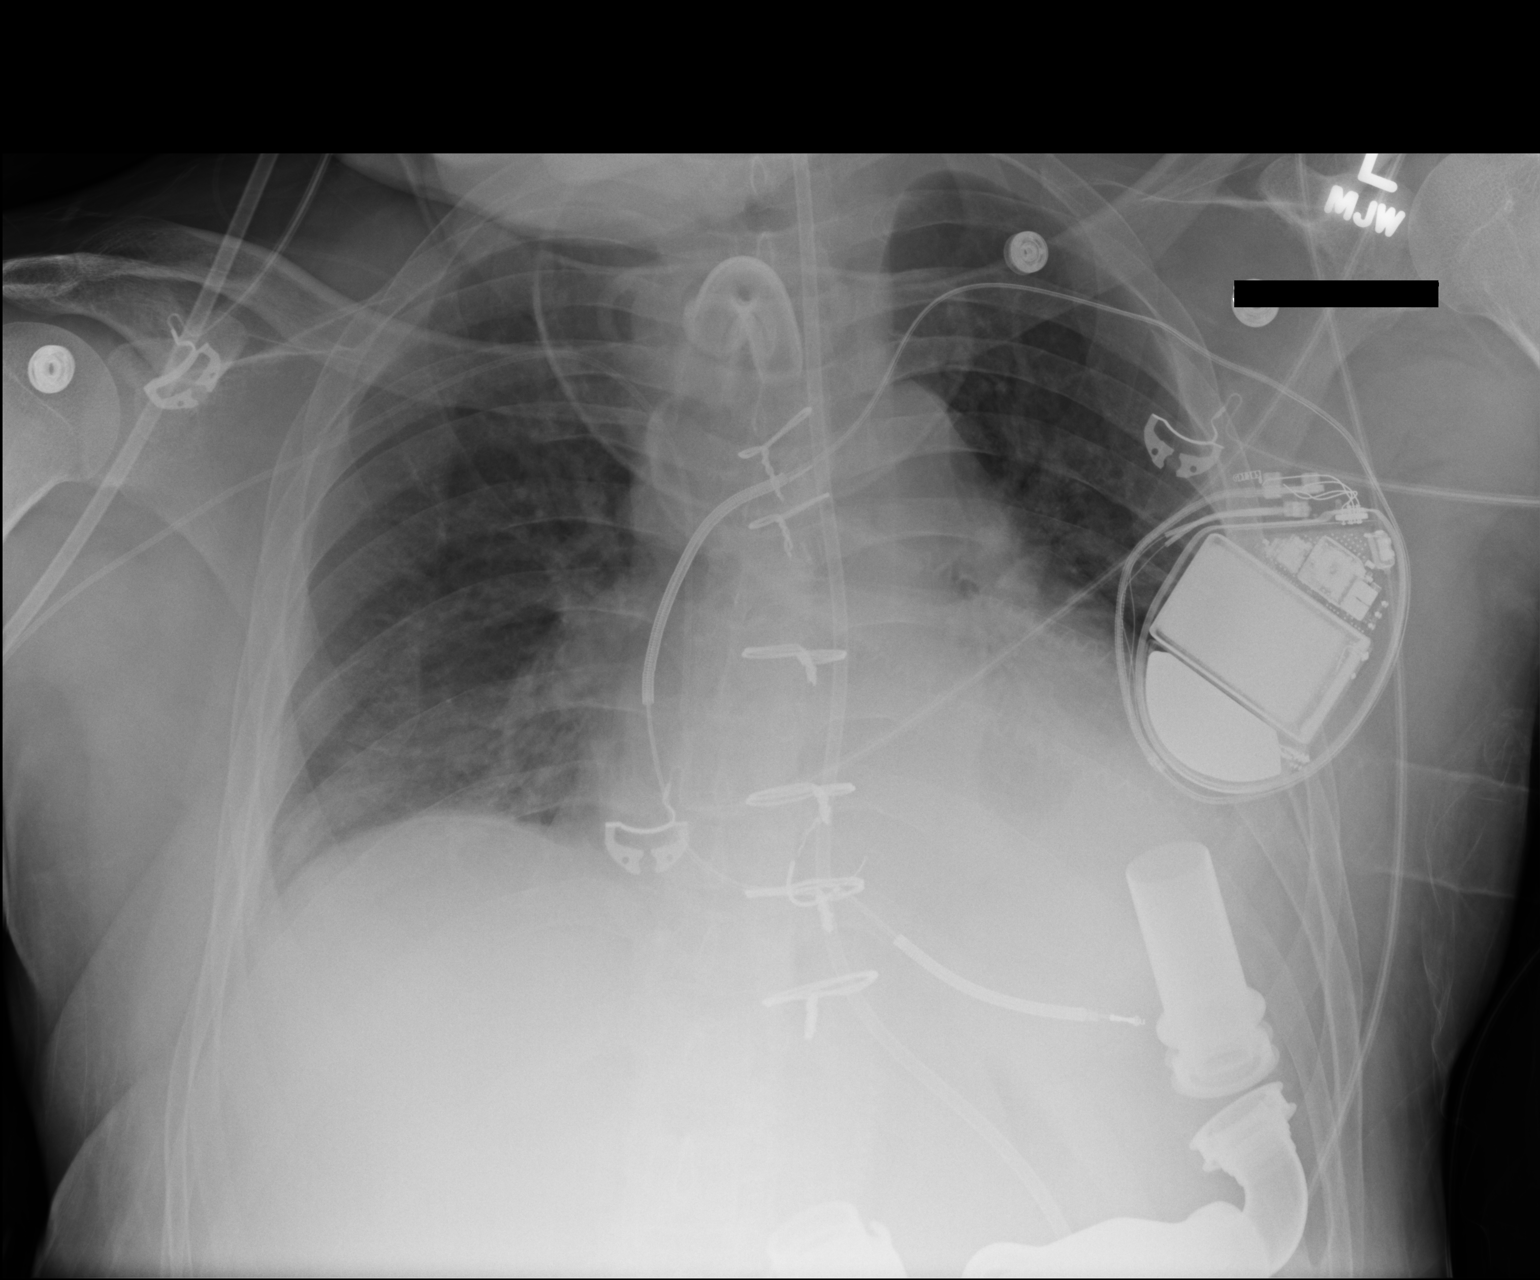

[1 of 1 positions shown; findings below may reference images not displayed]

DG CHEST 1V PORT dated
07/28/2013; DG CHEST 1V PORT dated 07/27/2013; DG CHEST 1V PORT dated
07/26/2013; DG CHEST 1V PORT dated 07/25/2013
FINDINGS: Left ventricular assist device noted. There is been interval
placement of a feeding tube which extends into the stomach.
Tracheostomy tube in place. AICD noted.

Left basilar airspace opacity noted, similar to prior. Cannot
exclude left pleural effusion. Indistinct airspace opacity at the
right lung base without complete obscuration of the right
hemidiaphragm. Overall similar appearance to prior aside from the
new feeding tube.
IMPRESSION: 1. New feeding tube in place. Otherwise stable appearance of the
chest.

## 2014-09-19 NOTE — Progress Notes (Signed)
Patient ID: [name], male   DOB: 08-Aug-1960, 54 y.o.   MRN: [id number]  Primary Physician: Dr. Oliver Barre  Primary Cardiologist: Dr. Gala Romney  HPI: [name] is a 54 yo male with a history of severe CHF, NICM s/p LVAD and TVR (09/2011), VT, NSTEMI, LV thrombus, CKD, GERD, stroke and LVAD hemolysis with LVAD exchange (08/2013).   He was admitted to [facility] January 2015 with NSTEMI and RV failure. He had a difficult course that was complicated by embolic right CVA. He was intubated and eventually had a tracheostomy placed 07/28/13. He was transferred to inpatient rehab for extensive rehab and was making progression, however despite aggressive therapy with heparin, coumadin, ASA and Plavix, his LDH remained persistently elevated in the 950 range (was about 1750 on admit). He did not experience any pump dysfunction, however the case was discussed with Dr Allena Katz and [name] at [facility] and it was felt to transfer him down there for possible pump exchange. He underwent a pump exchange 08/31/13. Post op was complicated by bleeding and he received multiple transfusions. He had AKI and had intermittent HD and underwent last HD on 09/24/13. Last Cr on chart 1.9 (10/02/13)  Admitted 5/13-5/18/15 for volume overload and possible hemolysis. LDH 647 and INR 1.69 on admission. Treated with IV lasix and IV heparin. While in the hospital developed some PI events and speed was decreased to 9400. ICD interrogated and showed A flutter and he was placed on IV amiodarone and then transitioned to 200 mg PO BID. He cardioverted to NSR on 5/15. LDH trended down and on discharge was 530 and INR 2.4.  In January 2016 admitted with multiple low flow alarms. Diuretics held and given IVF with improvement. Echo showed severe RV dysfunction  Follow up for Heart Failure and LVAD: Doing pretty well. Working at [employer]. Taking his meds. Denies swelling. Breathing ok. No orthopnea or CP.  Was unable to tolerate hydraalzine 100 tid due to dizziness now on  50 tid. No dizziness. Weight at home stable. .No falls.  INR has been therapeutic. Asking for viagra for ED.   No problems with bleeding or driveline site. No hematuria or dark urine.   SH: Disabled, lives at home with caregiver. No ETOH or smoking FH: Mother living; HTN        Father living; no health issues   No alarms.  Denies driveline trauma, erythema or drainage.  Denies ICD shocks.   Reports taking Coumadin as prescribed and adherence to anticoagulation based dietary restrictions.  Denies bright red blood per rectum or melena, no dark urine or hematuria.     Past Medical History  Diagnosis Date  . CHF (congestive heart failure)     EF- 10-15  . Medically noncompliant   . Mitral regurgitation   . Tobacco user   . HTN (hypertension)   . AICD (automatic cardioverter/defibrillator) present   . GERD (gastroesophageal reflux disease)   . Substance abuse   . Chronic renal insufficiency   . Syncope   . Thrombus 08/06/2010  . SYSTOLIC HEART FAILURE, CHRONIC 09/22/2008    Qualifier: Diagnosis of  By: Gala Romney, MD, Trixie Dredge   . LV (left ventricular) mural thrombus 01/28/2011  . ICD - IN SITU 09/16/2008    Qualifier: Diagnosis of  By: Wonda Amis    . MITRAL STENOSIS/ INSUFFICIENCY, NON-RHEUMATIC 09/22/2008    Qualifier: Diagnosis of  By: Gala Romney, MD, Trixie Dredge Hepatomegaly 09/16/2008    Qualifier: Diagnosis of  By:  Bednar, NP-C, Marcelino Duster    . High cholesterol 02/26/2012    "at one time"  . Sleep apnea   . Exertional dyspnea 02/26/2012  . History of blood transfusion 08/2011    "when I had heart pump"  . Migraines   . COMMON MIGRAINE 06/14/2009    Qualifier: Diagnosis of  By: Jonny Ruiz MD, Len Blalock   . History of gout 02/26/2012  . Depression 08/11/2013    Pt denies  . Bipolar affective disorder 10/22/2011    pt denies this hx 02/26/2012    Current Outpatient Prescriptions  Medication Sig Dispense Refill  . aspirin 325 MG EC tablet Take 1 tablet (325 mg total) by  mouth daily. 30 tablet 6  . atorvastatin (LIPITOR) 40 MG tablet Take 1 tablet (40 mg total) by mouth daily. 30 tablet 3  . carvedilol (COREG) 3.125 MG tablet Take 1 tablet (3.125 mg total) by mouth 2 (two) times daily with a meal. 60 tablet 6  . clopidogrel (PLAVIX) 75 MG tablet Take 1 tablet (75 mg total) by mouth daily with breakfast. 30 tablet 6  . furosemide (LASIX) 20 MG tablet Take 1 tablet (20 mg total) by mouth every other day. Or as directed (Patient taking differently: Take 20 mg by mouth daily. Or as directed) 60 tablet 6  . losartan (COZAAR) 100 MG tablet Take 1 tablet (100 mg total) by mouth daily. 30 tablet 6  . potassium chloride SA (K-DUR,KLOR-CON) 20 MEQ tablet Take two tabs daily. May take one extra tab on days you take extra lasix (Patient taking differently: 20 mEq daily. Take two tabs daily. May take one extra tab on days you take extra lasix) 60 tablet 6  . tamsulosin (FLOMAX) 0.4 MG CAPS capsule Take 1 capsule (0.4 mg total) by mouth daily. 30 capsule 6  . warfarin (COUMADIN) 4 MG tablet Take 1 tablet (4 mg total) by mouth daily. Hold lasix 1/13 and 1/14 then start 4 mg daily on 07/08/14 45 tablet 6  . hydrALAZINE (APRESOLINE) 50 MG tablet Take 1 tablet (50 mg total) by mouth 3 (three) times daily. 90 tablet 6  . tadalafil (CIALIS) 10 MG tablet Take 1 tablet (10 mg total) by mouth daily as needed for erectile dysfunction. 10 tablet 3   No current facility-administered medications for this encounter.   Facility-Administered Medications Ordered in Other Encounters  Medication Dose Route Frequency Provider Last Rate Last Dose  . sodium chloride 0.9 % injection 10-40 mL  10-40 mL Intracatheter PRN [name], MD        Ace inhibitors and Lexapro  REVIEW OF SYSTEMS: All systems negative except as listed in HPI, PMH and Problem list.   LVAD INTERROGATION:   Speed: 9200  Flow: 3.5  Power: 4.6  PI: 5.4  Alarms: none  Events: 0 - 10 PI events  Fixed speed: 9200   Low speed limit: 8600  Primary Controller: Replace back up battery in 26 months (03/2016) [id number]; version V7.23  Back up controller: Replace back up battery in 18 months (08/2015) PC [id number]; version V7.23    I reviewed the LVAD parameters from today, and compared the results to the patient's prior recorded data. No changes to LVAD speed. The LVAD is functioning within specified parameters.  The patient performs LVAD self-test daily.  LVAD interrogation was negative for any significant power changes, alarms or PI events/speed drops.  LVAD equipment check completed and is in good working order.  Back-up equipment present.   LVAD education  done on emergency procedures and precautions and reviewed exit site care.    Filed Vitals:   09/08/14 1106  BP: 85/0  Pulse: 77  Height:  (1.626 m)  Weight: 182 lb 12.8 oz (82.918 kg)  SpO2: 98%    Physical Exam: GENERAL: Well appearing, male who presents to clinic today HEENT: normal  NECK: Supple, JVP 7, .2+ bilaterally, no bruits.  No lymphadenopathy or thyromegaly appreciated.   CARDIAC:  Mechanical heart sounds with LVAD hum present, prominent heart sounds LUNGS:  Clear to auscultation bilaterally.  ABDOMEN:  Soft, round, nontender, positive bowel sounds x4.     LVAD exit site: well-healed and incorporated.  No erythema or drainage.  Stabilization device present and accurately applied. Dressing being changed weekly. EXTREMITIES:  Warm and dry, no cyanosis, clubbing, rash, trace edema L>R NEUROLOGIC:  Alert and oriented x 4.  Gait steady.  No aphasia.  No dysarthria.  Affect pleasant.      ASSESSMENT AND PLAN:   1) Chronic systolic HF: NICM, s/p LVAD implant (08/2011) and subsequent (08/2013) - Doing well. Volume status looks good. Ok to continue current lasix doing. - NYHA II symptoms. Working at [employer] - MAP better. Did not tolerate hydralazine 100 tid. Now doing well on 50 tid -  Reinforced the need and importance of daily weights,  a low sodium diet, and fluid restriction (less than 2 L a day). Instructed to call the HF clinic if weight increases more than 3 lbs overnight or 5 lbs in a week.  2) LVAD, s/p (08/2011) and then (08/2013) d/t pump thrombosis - VAD interrogated personally. Keep speed at 9200..  - Check LDH, CBC, BMET, pro-BNP and INR today. - Not a tx candidate d.t compliance issues.  3) HTN - As above. Controlled on hydralazine 50 tid 4) CVA - Stable.Continues to have left sided weakness. No longer in therapy. Continue atorvastatin 40 mg daily.  6) h/o Atrial Flutter: - now in NSR 7) Anticoagulation management - INR Goal 2.5-3.0. He has had multiple supratheraeputic INRs and has missed multiple INR appointments. Will check INR today and adjust accordingly. Long talk about home INR monitoring 8) RV failure - Stable. Volume status ok.  9) Erectile dysfunction - No PDE-5 due to Imdur therapy  F/U 1 month  Arvilla Meres MD  12:11 AM

## 2014-09-22 ENCOUNTER — Other Ambulatory Visit: Payer: Medicare Other

## 2014-09-24 IMAGING — RF DG ABDOMEN 1V
1 series · 1 of 1 positions shown · non-contrast
Comparison: DG NASO G TUBE DOMAGOY MATEU/NICKY POPO dated 08/12/2013

CLINICAL DATA: Feeding tube placement

EXAM:
ABDOMEN - 1 VIEW

[Series 1: run · 1 of 1 slices shown]
[im 1/1]
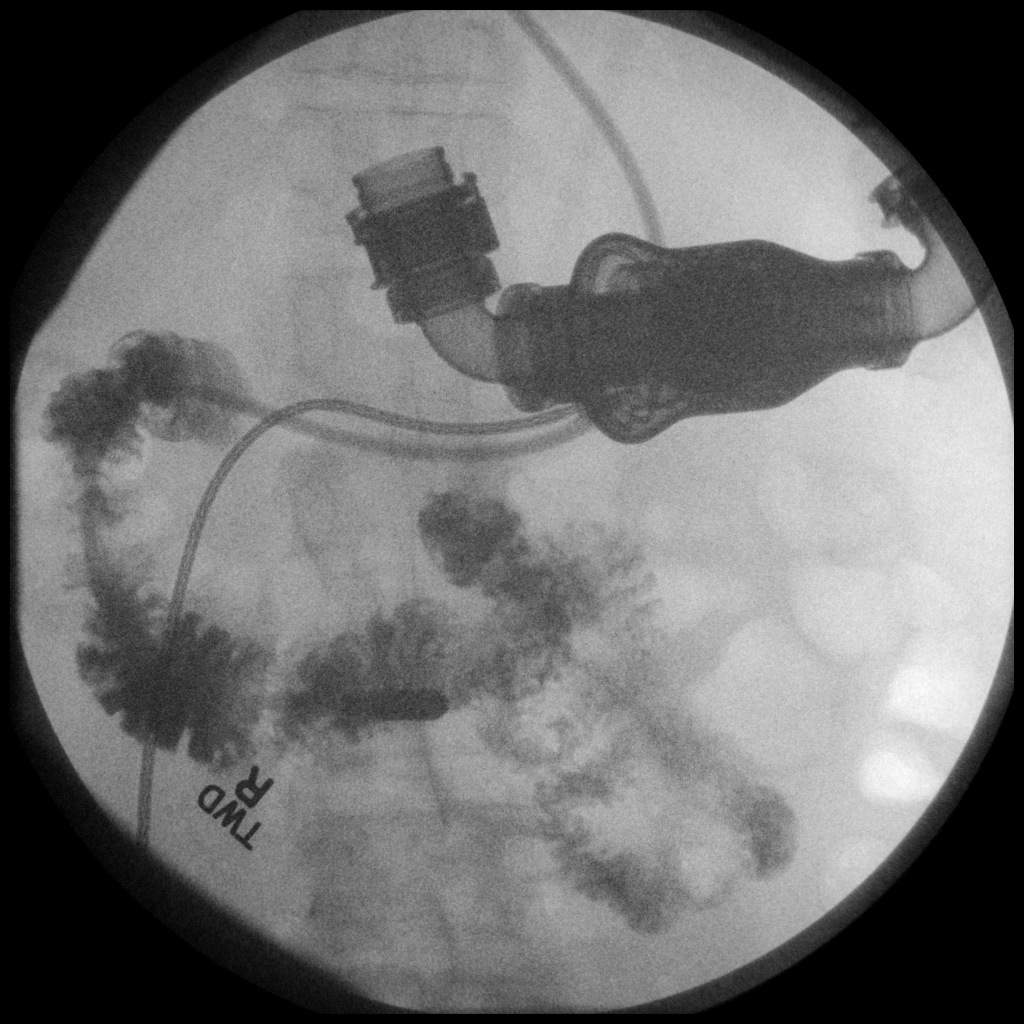

[1 of 1 positions shown; findings below may reference images not displayed]

FINDINGS: A feeding tube has been advanced to the level of the distal duodenum
near the ligament of Treitz.
IMPRESSION: Feeding tube tip lies in the distal duodenum.

## 2014-09-26 ENCOUNTER — Telehealth (HOSPITAL_COMMUNITY): Payer: Self-pay | Admitting: Infectious Diseases

## 2014-09-26 NOTE — Telephone Encounter (Signed)
Called re: missed INR appointment last week. She assures me he will get in there sometime this week for the INR to be drawn.

## 2014-09-28 ENCOUNTER — Ambulatory Visit (HOSPITAL_COMMUNITY)
Admission: RE | Admit: 2014-09-28 | Discharge: 2014-09-28 | Disposition: A | Discharge status: Home / Self Care | Payer: Medicare Other | Source: Ambulatory Visit | Attending: Cardiology | Admitting: Cardiology

## 2014-09-28 ENCOUNTER — Ambulatory Visit (HOSPITAL_COMMUNITY): Payer: Self-pay | Admitting: Infectious Diseases

## 2014-09-28 ENCOUNTER — Other Ambulatory Visit (HOSPITAL_COMMUNITY): Payer: Self-pay | Admitting: *Deleted

## 2014-09-28 DIAGNOSIS — I483 Typical atrial flutter: Secondary | ICD-10-CM | POA: Insufficient documentation

## 2014-09-28 DIAGNOSIS — Z95811 Presence of heart assist device: Secondary | ICD-10-CM

## 2014-09-28 DIAGNOSIS — Z7901 Long term (current) use of anticoagulants: Secondary | ICD-10-CM

## 2014-09-28 DIAGNOSIS — I5022 Chronic systolic (congestive) heart failure: Secondary | ICD-10-CM

## 2014-09-28 LAB — PROTIME-INR
INR: 1.85 — AB (ref 0.00–1.49)
Prothrombin Time: 21.5 seconds — ABNORMAL HIGH (ref 11.6–15.2)

## 2014-09-28 IMAGING — DX DG ABD PORTABLE 1V
1 series · 1 of 1 positions shown · non-contrast
Comparison: 07/30/2013 and prior studies

CLINICAL DATA: Abdominal distention and discomfort.

EXAM:
PORTABLE ABDOMEN - 1 VIEW

[supine ap]
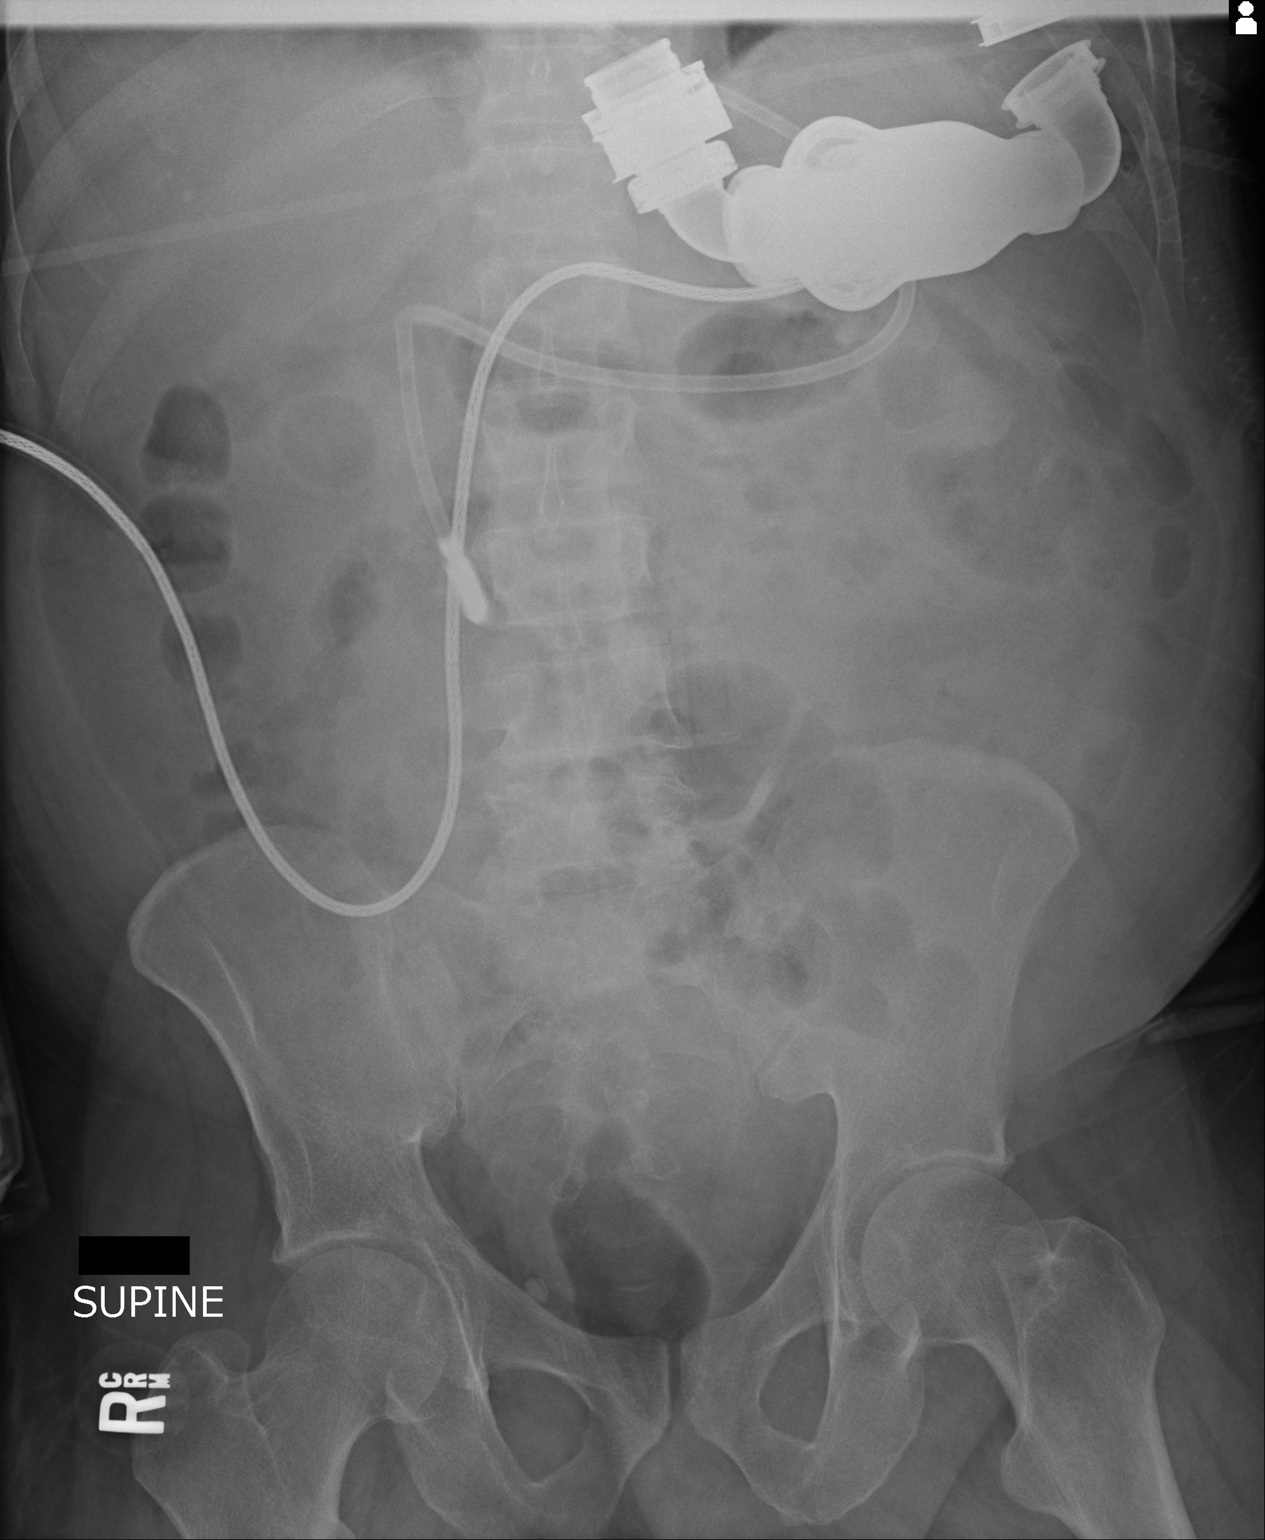

[1 of 1 positions shown; findings below may reference images not displayed]

FINDINGS: The bowel gas pattern is normal.

No dilated bowel loops are present.

The small bore feeding tube is present with tip overlying the
proximal duodenum.

Left ventricular assist device again noted.
IMPRESSION: Unremarkable bowel gas pattern.  No acute abnormalities.

## 2014-10-03 ENCOUNTER — Other Ambulatory Visit: Payer: Medicare Other

## 2014-10-04 ENCOUNTER — Other Ambulatory Visit: Payer: Medicare Other

## 2014-10-06 ENCOUNTER — Ambulatory Visit (HOSPITAL_COMMUNITY): Payer: Self-pay | Admitting: *Deleted

## 2014-10-06 ENCOUNTER — Other Ambulatory Visit (INDEPENDENT_AMBULATORY_CARE_PROVIDER_SITE_OTHER): Payer: Medicare Other | Admitting: *Deleted

## 2014-10-06 DIAGNOSIS — Z7901 Long term (current) use of anticoagulants: Secondary | ICD-10-CM

## 2014-10-06 DIAGNOSIS — Z95811 Presence of heart assist device: Secondary | ICD-10-CM

## 2014-10-06 LAB — PROTIME-INR
INR: 2.7 ratio — ABNORMAL HIGH (ref 0.8–1.0)
PROTHROMBIN TIME: 29.4 s — AB (ref 9.6–13.1)

## 2014-10-24 ENCOUNTER — Other Ambulatory Visit: Payer: Medicare Other

## 2014-10-27 ENCOUNTER — Telehealth: Payer: Self-pay | Admitting: Infectious Diseases

## 2014-10-27 ENCOUNTER — Other Ambulatory Visit: Payer: Medicare Other

## 2014-10-27 NOTE — Telephone Encounter (Signed)
Called re: missed INR appointment Monday 10/24/14. Message left and requested to come today to have labs drawn at [facility] lab

## 2014-10-31 ENCOUNTER — Telehealth (HOSPITAL_COMMUNITY): Payer: Self-pay | Admitting: Infectious Diseases

## 2014-10-31 NOTE — Telephone Encounter (Signed)
Called Irish Lack to see if she could help get [name] to [facility] lab for an INR since he missed is appointment last week and still has not gotten it done. She verbalized she would get him there sometime this week depending on his transportation availability.

## 2014-11-03 ENCOUNTER — Ambulatory Visit (HOSPITAL_COMMUNITY): Payer: Self-pay | Admitting: Infectious Diseases

## 2014-11-03 ENCOUNTER — Other Ambulatory Visit (HOSPITAL_COMMUNITY): Payer: Self-pay | Admitting: Internal Medicine

## 2014-11-03 ENCOUNTER — Other Ambulatory Visit (INDEPENDENT_AMBULATORY_CARE_PROVIDER_SITE_OTHER): Payer: Medicare Other

## 2014-11-03 DIAGNOSIS — Z95811 Presence of heart assist device: Secondary | ICD-10-CM | POA: Diagnosis not present

## 2014-11-03 DIAGNOSIS — Z7901 Long term (current) use of anticoagulants: Secondary | ICD-10-CM

## 2014-11-03 LAB — PROTIME-INR
INR: 2.6 — AB (ref <1.50)
PROTHROMBIN TIME: 28 s — AB (ref 11.6–15.2)

## 2014-11-08 ENCOUNTER — Ambulatory Visit (HOSPITAL_BASED_OUTPATIENT_CLINIC_OR_DEPARTMENT_OTHER)
Admission: RE | Admit: 2014-11-08 | Discharge: 2014-11-08 | Disposition: A | Discharge status: Home / Self Care | Payer: Medicare Other | Source: Ambulatory Visit | Attending: Cardiology | Admitting: Cardiology

## 2014-11-08 ENCOUNTER — Inpatient Hospital Stay (HOSPITAL_COMMUNITY): Payer: Medicare Other

## 2014-11-08 ENCOUNTER — Ambulatory Visit (HOSPITAL_COMMUNITY): Payer: Self-pay | Admitting: *Deleted

## 2014-11-08 ENCOUNTER — Inpatient Hospital Stay (HOSPITAL_COMMUNITY)
Admission: AD | Admit: 2014-11-08 | Discharge: 2014-11-10 | DRG: 292 | Disposition: A | Discharge status: Home / Self Care | Payer: Medicare Other | Source: Ambulatory Visit | Attending: Internal Medicine | Admitting: Internal Medicine

## 2014-11-08 ENCOUNTER — Other Ambulatory Visit (HOSPITAL_COMMUNITY): Payer: Self-pay | Admitting: *Deleted

## 2014-11-08 VITALS — BP 105/81 | HR 84 | Ht 64.0 in | Wt 199.8 lb

## 2014-11-08 DIAGNOSIS — R0600 Dyspnea, unspecified: Secondary | ICD-10-CM

## 2014-11-08 DIAGNOSIS — I509 Heart failure, unspecified: Secondary | ICD-10-CM

## 2014-11-08 DIAGNOSIS — I5023 Acute on chronic systolic (congestive) heart failure: Secondary | ICD-10-CM

## 2014-11-08 DIAGNOSIS — G4730 Sleep apnea, unspecified: Secondary | ICD-10-CM | POA: Diagnosis present

## 2014-11-08 DIAGNOSIS — N529 Male erectile dysfunction, unspecified: Secondary | ICD-10-CM | POA: Diagnosis present

## 2014-11-08 DIAGNOSIS — Z87891 Personal history of nicotine dependence: Secondary | ICD-10-CM | POA: Diagnosis not present

## 2014-11-08 DIAGNOSIS — Z8673 Personal history of transient ischemic attack (TIA), and cerebral infarction without residual deficits: Secondary | ICD-10-CM | POA: Diagnosis not present

## 2014-11-08 DIAGNOSIS — Z95811 Presence of heart assist device: Secondary | ICD-10-CM

## 2014-11-08 DIAGNOSIS — I4892 Unspecified atrial flutter: Secondary | ICD-10-CM | POA: Diagnosis present

## 2014-11-08 DIAGNOSIS — Z79899 Other long term (current) drug therapy: Secondary | ICD-10-CM

## 2014-11-08 DIAGNOSIS — Z7982 Long term (current) use of aspirin: Secondary | ICD-10-CM | POA: Diagnosis not present

## 2014-11-08 DIAGNOSIS — Z9119 Patient's noncompliance with other medical treatment and regimen: Secondary | ICD-10-CM | POA: Diagnosis present

## 2014-11-08 DIAGNOSIS — I129 Hypertensive chronic kidney disease with stage 1 through stage 4 chronic kidney disease, or unspecified chronic kidney disease: Secondary | ICD-10-CM | POA: Diagnosis present

## 2014-11-08 DIAGNOSIS — I159 Secondary hypertension, unspecified: Secondary | ICD-10-CM

## 2014-11-08 DIAGNOSIS — K219 Gastro-esophageal reflux disease without esophagitis: Secondary | ICD-10-CM | POA: Diagnosis present

## 2014-11-08 DIAGNOSIS — Z95810 Presence of automatic (implantable) cardiac defibrillator: Secondary | ICD-10-CM | POA: Diagnosis not present

## 2014-11-08 DIAGNOSIS — I428 Other cardiomyopathies: Secondary | ICD-10-CM | POA: Diagnosis present

## 2014-11-08 DIAGNOSIS — Z8249 Family history of ischemic heart disease and other diseases of the circulatory system: Secondary | ICD-10-CM | POA: Diagnosis not present

## 2014-11-08 DIAGNOSIS — E780 Pure hypercholesterolemia: Secondary | ICD-10-CM | POA: Diagnosis present

## 2014-11-08 DIAGNOSIS — Z7901 Long term (current) use of anticoagulants: Secondary | ICD-10-CM | POA: Diagnosis not present

## 2014-11-08 DIAGNOSIS — I252 Old myocardial infarction: Secondary | ICD-10-CM

## 2014-11-08 DIAGNOSIS — I10 Essential (primary) hypertension: Secondary | ICD-10-CM

## 2014-11-08 DIAGNOSIS — Z7902 Long term (current) use of antithrombotics/antiplatelets: Secondary | ICD-10-CM | POA: Diagnosis not present

## 2014-11-08 DIAGNOSIS — E8770 Fluid overload, unspecified: Secondary | ICD-10-CM | POA: Diagnosis present

## 2014-11-08 DIAGNOSIS — T829XXD Unspecified complication of cardiac and vascular prosthetic device, implant and graft, subsequent encounter: Secondary | ICD-10-CM

## 2014-11-08 LAB — BASIC METABOLIC PANEL
Anion gap: 10 (ref 5–15)
BUN: 10 mg/dL (ref 6–20)
CHLORIDE: 104 mmol/L (ref 101–111)
CO2: 27 mmol/L (ref 22–32)
Calcium: 9.2 mg/dL (ref 8.9–10.3)
Creatinine, Ser: 1.21 mg/dL (ref 0.61–1.24)
GFR calc Af Amer: >60 mL/min (ref >60)
GFR calc non Af Amer: >60 mL/min (ref >60)
Glucose, Bld: 96 mg/dL (ref 65–99)
Potassium: 3.8 mmol/L (ref 3.5–5.1)
Sodium: 141 mmol/L (ref 135–145)

## 2014-11-08 LAB — CBC
HCT: 40.7 % (ref 39.0–52.0)
HEMOGLOBIN: 13 g/dL (ref 13.0–17.0)
MCH: 26.6 pg (ref 26.0–34.0)
MCHC: 31.9 g/dL (ref 30.0–36.0)
MCV: 83.2 fL (ref 78.0–100.0)
Platelets: 216 10*3/uL (ref 150–400)
RBC: 4.89 MIL/uL (ref 4.22–5.81)
RDW: 19.5 % — ABNORMAL HIGH (ref 11.5–15.5)
WBC: 3 10*3/uL — ABNORMAL LOW (ref 4.0–10.5)

## 2014-11-08 LAB — MRSA PCR SCREENING: MRSA by PCR: NEGATIVE

## 2014-11-08 LAB — BRAIN NATRIURETIC PEPTIDE: B NATRIURETIC PEPTIDE 5: 618.2 pg/mL — AB (ref 0.0–100.0)

## 2014-11-08 LAB — PROTIME-INR
INR: 2.06 — AB (ref 0.00–1.49)
PROTHROMBIN TIME: 23.4 s — AB (ref 11.6–15.2)

## 2014-11-08 LAB — LACTATE DEHYDROGENASE: LDH: 367 U/L — ABNORMAL HIGH (ref 98–192)

## 2014-11-08 MED ORDER — FUROSEMIDE 10 MG/ML IJ SOLN
80.0000 mg | Freq: Two times a day (BID) | INTRAMUSCULAR | Status: DC
  Administered 2014-11-08 – 2014-11-10 (×4): 80 mg via INTRAVENOUS
  Filled 2014-11-08 (×6): qty 8

## 2014-11-08 MED ORDER — WARFARIN - PHYSICIAN DOSING INPATIENT: Freq: Every day | Status: DC

## 2014-11-08 MED ORDER — ATORVASTATIN CALCIUM 40 MG PO TABS
40.0000 mg | ORAL_TABLET | Freq: Every day | ORAL | Status: DC
  Administered 2014-11-09 – 2014-11-10 (×2): 40 mg via ORAL
  Filled 2014-11-08 (×2): qty 1

## 2014-11-08 MED ORDER — LOSARTAN POTASSIUM 50 MG PO TABS
100.0000 mg | ORAL_TABLET | Freq: Every day | ORAL | Status: DC
  Administered 2014-11-09 – 2014-11-10 (×2): 100 mg via ORAL
  Filled 2014-11-08 (×2): qty 2

## 2014-11-08 MED ORDER — WARFARIN - PHARMACIST DOSING INPATIENT
Freq: Every day | Status: DC
  Administered 2014-11-09: 17:00:00

## 2014-11-08 MED ORDER — SODIUM CHLORIDE 0.9 % IJ SOLN
10.0000 mL | Freq: Two times a day (BID) | INTRAMUSCULAR | Status: DC
  Administered 2014-11-08 – 2014-11-10 (×4): 10 mL

## 2014-11-08 MED ORDER — HYDRALAZINE HCL 50 MG PO TABS
50.0000 mg | ORAL_TABLET | Freq: Three times a day (TID) | ORAL | Status: DC
  Administered 2014-11-08 – 2014-11-10 (×4): 50 mg via ORAL
  Filled 2014-11-08 (×7): qty 1

## 2014-11-08 MED ORDER — TAMSULOSIN HCL 0.4 MG PO CAPS
0.4000 mg | ORAL_CAPSULE | Freq: Every day | ORAL | Status: DC
  Administered 2014-11-09 – 2014-11-10 (×2): 0.4 mg via ORAL
  Filled 2014-11-08 (×2): qty 1

## 2014-11-08 MED ORDER — SODIUM CHLORIDE 0.9 % IJ SOLN: 10.0000 mL | INTRAMUSCULAR | Status: DC | PRN

## 2014-11-08 MED ORDER — CARVEDILOL 3.125 MG PO TABS
3.1250 mg | ORAL_TABLET | Freq: Two times a day (BID) | ORAL | Status: DC
  Administered 2014-11-08 – 2014-11-10 (×4): 3.125 mg via ORAL
  Filled 2014-11-08 (×6): qty 1

## 2014-11-08 MED ORDER — WARFARIN SODIUM 4 MG PO TABS
4.0000 mg | ORAL_TABLET | Freq: Every day | ORAL | Status: DC
  Administered 2014-11-08: 4 mg via ORAL
  Filled 2014-11-08 (×2): qty 1

## 2014-11-08 MED ORDER — ASPIRIN EC 325 MG PO TBEC
325.0000 mg | DELAYED_RELEASE_TABLET | Freq: Every day | ORAL | Status: DC
  Administered 2014-11-09 – 2014-11-10 (×2): 325 mg via ORAL
  Filled 2014-11-08 (×2): qty 1

## 2014-11-08 MED ORDER — CLOPIDOGREL BISULFATE 75 MG PO TABS
75.0000 mg | ORAL_TABLET | Freq: Every day | ORAL | Status: DC
  Administered 2014-11-09 – 2014-11-10 (×2): 75 mg via ORAL
  Filled 2014-11-08 (×3): qty 1

## 2014-11-08 NOTE — Addendum Note (Signed)
Encounter addended by: Levonne Spiller, RN on: 11/08/2014  4:24 PM<BR>     Documentation filed: Notes Section, Patient Instructions Section

## 2014-11-08 NOTE — Progress Notes (Signed)
ANTICOAGULATION CONSULT NOTE - Initial Consult  Pharmacy Consult for Warfarin Indication: LVAD  Allergies  Allergen Reactions  . Ace Inhibitors Cough  . Lexapro [Escitalopram Oxalate] Other (See Comments)    somnolence    Patient Measurements: Height: 5\' 4"  (162.6 cm) Weight: 186 lb 15.2 oz (84.8 kg) IBW/kg (Calculated) : 59.2 Heparin Dosing Weight: n/a  Vital Signs: Temp: 98.1 F (36.7 C) (05/17 1600) Temp Source: Oral (05/17 1600) BP: 105/81 mmHg (05/17 1438) Pulse Rate: 80 (05/17 1600)  Labs:  Recent Labs  11/08/14 1355  HGB 13.0  HCT 40.7  PLT 216  LABPROT 23.4*  INR 2.06*  CREATININE 1.21    Estimated Creatinine Clearance: 70.9 mL/min (by C-G formula based on Cr of 1.21).   Medical History: Past Medical History  Diagnosis Date  . CHF (congestive heart failure)     EF- 10-15  . Medically noncompliant   . Mitral regurgitation   . Tobacco user   . HTN (hypertension)   . AICD (automatic cardioverter/defibrillator) present   . GERD (gastroesophageal reflux disease)   . Substance abuse   . Chronic renal insufficiency   . Syncope   . Thrombus 08/06/2010  . SYSTOLIC HEART FAILURE, CHRONIC 09/22/2008    Qualifier: Diagnosis of  By: Gala Romney, MD, Trixie Dredge   . LV (left ventricular) mural thrombus 01/28/2011  . ICD - IN SITU 09/16/2008    Qualifier: Diagnosis of  By: Wonda Amis    . MITRAL STENOSIS/ INSUFFICIENCY, NON-RHEUMATIC 09/22/2008    Qualifier: Diagnosis of  By: Gala Romney, MD, Trixie Dredge Hepatomegaly 09/16/2008    Qualifier: Diagnosis of  By: Wonda Amis    . High cholesterol 02/26/2012    "at one time"  . Sleep apnea   . Exertional dyspnea 02/26/2012  . History of blood transfusion 08/2011    "when I had heart pump"  . Migraines   . COMMON MIGRAINE 06/14/2009    Qualifier: Diagnosis of  By: Jonny Ruiz MD, Len Blalock   . History of gout 02/26/2012  . Depression 08/11/2013    Pt denies  . Bipolar affective disorder 10/22/2011   pt denies this hx 02/26/2012    Assessment: 54 yo male with LVAD, hx pump thrombosis and LVAD exchange in the past.  INR currently below targeted goal of 2.5-3.  Patient unclear on whether he's taken Coumadin today or not.  PTA dose 4 mg daily, but has missed several INR checks as an outpatient.  Goal of Therapy:  INR 2.5-3 Monitor platelets by anticoagulation protocol: Yes   Plan:  1. Since patient's INR goal is below goal, will give another dose of 4 mg today. 2. If INR remains low tomorrow, will need to increase dose.  Thanks!  Tad Moore, BCPS  Clinical Pharmacist Pager [phone]  11/08/2014 5:33 PM

## 2014-11-08 NOTE — H&P (Signed)
VAD TEAM History & Physical Note   Reason for Admission: Volume Overload    HPI:    Montez is a 54 yo male with a history of severe CHF, NICM s/p LVAD and TVR (09/2011), VT, NSTEMI, LV thrombus, CKD, GERD, stroke and LVAD hemolysis with LVAD exchange (08/2013).   He was admitted to [facility] January 2015 with NSTEMI and RV failure. He had a difficult course that was complicated by embolic right CVA. He was intubated and eventually had a tracheostomy placed 07/28/13. He was transferred to inpatient rehab for extensive rehab and was making progression, however despite aggressive therapy with heparin, coumadin, ASA and Plavix, his LDH remained persistently elevated in the 950 range (was about 1750 on admit). He did not experience any pump dysfunction, however the case was discussed with Dr Allena Katz and [name] at [facility] and it was felt to transfer him down there for possible pump exchange. He underwent a pump exchange 08/31/13. Post op was complicated by bleeding and he received multiple transfusions. He had AKI and had intermittent HD and underwent last HD on 09/24/13. Last Cr on chart 1.9 (10/02/13)  Admitted 5/13-5/18/15 for volume overload and possible hemolysis. LDH 647 and INR 1.69 on admission. Treated with IV lasix and IV heparin. While in the hospital developed some PI events and speed was decreased to 9400. ICD interrogated and showed A flutter and he was placed on IV amiodarone and then transitioned to 200 mg PO BID. He cardioverted to NSR on 5/15. LDH trended down and on discharge was 530 and INR 2.4.  In January 2016 admitted with multiple low flow alarms. Diuretics held and given IVF with improvement. Echo showed severe RV dysfunction  Today he was seen the HF/LVAD clinic.  Overall feeling fair. Taking lasix 20 mg daily. Says he has had multiple low alarms. Denies SOB/PND/ alarms. No dizziness. Not weighing daily. INR has been therapeutic but he is missing appointments. . Working 3 hours at  [employer]. He is changing his own dressing weekly. The last 2 times he changed his dressing and the drive line was pulled out to velor. No fevers or chills. Ongoing alcohol intake.  No problems with bleeding or driveline site. No hematuria or dark urine.     LVAD INTERROGATION:  HeartMate II LVAD:  Flow 3.8 liters/min, speed 9000, power 4.8  PI 6.3 .    SH: Disabled, lives at home with caregiver. No ETOH or smoking FH: Mother living; HTN  Father living; no health issues   Review of Systems: [y] = yes,  = no   General: Weight gain [Y ]; Weight loss ; Anorexia ; Fatigue ; Fever ; Chills ; Weakness   Cardiac: Chest pain/pressure ; Resting SOB ; Exertional SOB [Y ]; Orthopnea ; Pedal Edema ; Palpitations ; Syncope ; Presyncope ; Paroxysmal nocturnal dyspnea[ ]   Pulmonary: Cough ; Wheezing[ ] ; Hemoptysis[ ] ; Sputum ; Snoring   GI: Vomiting[ ] ; Dysphagia[ ] ; Melena[ ] ; Hematochezia ; Heartburn[ ] ; Abdominal pain ; Constipation ; Diarrhea ; BRBPR   GU: Hematuria[ ] ; Dysuria ; Nocturia[ ]   Vascular: Pain in legs with walking ; Pain in feet with lying flat ; Non-healing sores ; Stroke ; TIA ; Slurred speech ;  Neuro: Headaches[ ] ; Vertigo[ ] ; Seizures[ ] ;  Paresthesias[ ] ;Blurred vision [ ] ; Diplopia [ ] ; Vision changes [ ]   Ortho/Skin: Arthritis [ ] ; Joint pain [Y]; Muscle pain [ ] ; Joint swelling [ ] ; Back Pain [ ] ; Rash [ ]   Psych: Depression[ ] ; Anxiety[ ]   Heme: Bleeding problems [ ] ; Clotting disorders [ ] ; Anemia [ ]   Endocrine: Diabetes [ ] ; Thyroid dysfunction[ ]   Home Medications Prior to Admission medications   Medication Sig Start Date End Date Taking? Authorizing Provider  aspirin 325 MG EC tablet Take 1 tablet (325 mg total) by mouth daily. 05/09/14   [name], NP  atorvastatin (LIPITOR) 40 MG tablet Take 1 tablet (40 mg total) by mouth daily. 05/09/14   [name], NP  carvedilol  (COREG) 3.125 MG tablet Take 1 tablet (3.125 mg total) by mouth 2 (two) times daily with a meal. 05/09/14   [name], NP  clopidogrel (PLAVIX) 75 MG tablet Take 1 tablet (75 mg total) by mouth daily with breakfast. 08/12/14   Dolores Patty, MD  furosemide (LASIX) 20 MG tablet Take 1 tablet (20 mg total) by mouth every other day. Or as directed Patient taking differently: Take 20 mg by mouth daily. Or as directed 08/11/14   Dolores Patty, MD  hydrALAZINE (APRESOLINE) 50 MG tablet Take 1 tablet (50 mg total) by mouth 3 (three) times daily. 09/08/14   Dolores Patty, MD  losartan (COZAAR) 100 MG tablet Take 1 tablet (100 mg total) by mouth daily. 08/12/14   Dolores Patty, MD  potassium chloride SA (K-DUR,KLOR-CON) 20 MEQ tablet Take two tabs daily. May take one extra tab on days you take extra lasix 05/26/14   Dolores Patty, MD  tamsulosin (FLOMAX) 0.4 MG CAPS capsule Take 1 capsule (0.4 mg total) by mouth daily. 05/09/14   [name], NP  warfarin (COUMADIN) 4 MG tablet Take 1 tablet (4 mg total) by mouth daily. Hold lasix 1/13 and 1/14 then start 4 mg daily on 07/08/14 07/08/14   Amy D Filbert Schilder, NP    Past Medical History: Past Medical History  Diagnosis Date  . CHF (congestive heart failure)     EF- 10-15  . Medically noncompliant   . Mitral regurgitation   . Tobacco user   . HTN (hypertension)   . AICD (automatic cardioverter/defibrillator) present   . GERD (gastroesophageal reflux disease)   . Substance abuse   . Chronic renal insufficiency   . Syncope   . Thrombus 08/06/2010  . SYSTOLIC HEART FAILURE, CHRONIC 09/22/2008    Qualifier: Diagnosis of  By: Gala Romney, MD, Trixie Dredge   . LV (left ventricular) mural thrombus 01/28/2011  . ICD - IN SITU 09/16/2008    Qualifier: Diagnosis of  By: Wonda Amis    . MITRAL STENOSIS/ INSUFFICIENCY, NON-RHEUMATIC 09/22/2008    Qualifier: Diagnosis of  By: Gala Romney, MD, Trixie Dredge Hepatomegaly 09/16/2008     Qualifier: Diagnosis of  By: Wonda Amis    . High cholesterol 02/26/2012    "at one time"  . Sleep apnea   . Exertional dyspnea 02/26/2012  . History of blood transfusion 08/2011    "when I had heart pump"  . Migraines   . COMMON MIGRAINE 06/14/2009    Qualifier: Diagnosis of  By: Jonny Ruiz MD, Len Blalock   . History of gout 02/26/2012  . Depression 08/11/2013    Pt denies  . Bipolar affective disorder 10/22/2011    pt denies this  hx 02/26/2012    Past Surgical History: Past Surgical History  Procedure Laterality Date  . Cardiac defibrillator placement  ~ 2008  . Left ventricular assist device  08/2011  . Radiology with anesthesia N/A 07/19/2013    Procedure: RADIOLOGY WITH ANESTHESIA;  Surgeon: Oneal Grout, MD;  Location: [facility] OR;  Service: Radiology;  Laterality: N/A;  . Tracheostomy      feinstein  . Right heart catheterization N/A 07/08/2011    Procedure: RIGHT HEART CATH;  Surgeon: Dolores Patty, MD;  Location: [facility] CATH LAB;  Service: Cardiovascular;  Laterality: N/A;  . Right heart catheterization N/A 09/09/2011    Procedure: RIGHT HEART CATH;  Surgeon: Dolores Patty, MD;  Location[facility] CATH LAB;  Service: Cardiovascular;  Laterality: N/A;  . Intra-aortic balloon pump insertion  09/09/2011    Procedure: INTRA-AORTIC BALLOON PUMP INSERTION;  Surgeon: Dolores Patty, MD;  Location: [facility] CATH LAB;  Service: Cardiovascular;;  . Left and right heart catheterization with coronary angiogram N/A 07/19/2013    Procedure: LEFT AND RIGHT HEART CATHETERIZATION WITH CORONARY ANGIOGRAM;  Surgeon: Dolores Patty, MD;  Location: [facility] CATH LAB;  Service: Cardiovascular;  Laterality: N/A;    Family History: Family History  Problem Relation Age of Onset  . Coronary artery disease Neg Hx     Social History: History   Social History  . Marital Status: Divorced    Spouse Name: N/A  . Number of Children: N/A  . Years of Education: N/A   Social History Main Topics  . Smoking  status: Former Smoker -- 0.25 packs/day for 33 years    Types: Cigarettes    Quit date: 06/29/2011  . Smokeless tobacco: Former Neurosurgeon    Quit date: 06/29/2011  . Alcohol Use: No  . Drug Use: No     Comment: denies  . Sexual Activity: Not Currently   Other Topics Concern  . Not on file   Social History Narrative    Allergies:  Allergies  Allergen Reactions  . Ace Inhibitors Cough  . Lexapro [Escitalopram Oxalate] Other (See Comments)    somnolence    Objective:    Vital Signs:       There were no vitals filed for this visit.  Mean arterial Pressure 96   Physical Exam: GENERAL: Well appearing, male who presents to clinic today HEENT: normal  NECK: Supple, JVP difficult to assess due to body habitus, .2+ bilaterally, no bruits. No lymphadenopathy or thyromegaly appreciated.  CARDIAC: Mechanical heart sounds with LVAD hum present, prominent heart sounds LUNGS: Clear to auscultation bilaterally.  ABDOMEN: Soft, round, distended, positive bowel sounds x4.  LVAD exit site: Drive line out 1 cm with expose velour. No erythema or drainage. Stabilization device present and accurately applied. Dressing being changed weekly. EXTREMITIES: Warm and dry, no cyanosis, clubbing, rash, R and LLE 3+ Cool extremities.  NEUROLOGIC: Alert and oriented x 4. Gait steady. No aphasia. No dysarthria. Affect pleasant.   Labs: Basic Metabolic Panel:  Recent Labs Lab 11/08/14 1355  NA 141  K 3.8  CL 104  CO2 27  GLUCOSE 96  BUN 10  CREATININE 1.21  CALCIUM 9.2    Liver Function Tests: No results for input(s): AST, ALT, ALKPHOS, BILITOT, PROT, ALBUMIN in the last 168 hours. No results for input(s): LIPASE, AMYLASE in the last 168 hours. No results for input(s): AMMONIA in the last 168 hours.  CBC:  Recent Labs Lab 11/08/14 1355  WBC 3.0*  HGB 13.0  HCT  40.7  MCV 83.2  PLT 216    Cardiac Enzymes: No results for input(s): CKTOTAL, CKMB, CKMBINDEX,  TROPONINI in the last 168 hours.  BNP: BNP (last 3 results)  Recent Labs  07/05/14 0855 09/08/14 1200 11/08/14 1355  BNP 489.6* 264.9* 618.2*    ProBNP (last 3 results)  Recent Labs  03/31/14 0950 05/04/14 1030 05/09/14 1052  PROBNP 634.7* 312.0* 1914.0*     CBG: No results for input(s): GLUCAP in the last 168 hours.  Coagulation Studies:  Recent Labs  11/08/14 1355  LABPROT 23.4*  INR 2.06*    Other results: EKG:   Imaging:  No results found.      Assessment/Plan    1)Acute/ Chronic systolic HF/RV failure: NICM, s/p LVAD implant (08/2011) and subsequent (08/2013) NYHAIII - Volume status elevated. Will admit for IV diuresis.  - Continue hydralazine 50 tid - Reinforced the need and importance of daily weights, a low sodium diet, and fluid restriction (less than 2 L a day). Instructed to call the HF clinic if weight increases more than 3 lbs overnight or 5 lbs in a week.  2) LVAD, s/p (08/2011) and then (08/2013) d/t pump thrombosis - VAD interrogated personally. Cut back speed to 9000 due to multiple low flows.  - Check LDH, CBC, BMET, pro-BNP and INR today. - Not a tx candidate due to compliance issues.  3) HTN - As above. Controlled on hydralazine 50 tid 4) CVA - Stable.Continues to have left sided weakness. No longer in therapy. Continue atorvastatin 40 mg daily.  6) h/o Atrial Flutter: - now in NSR 7) Anticoagulation management - INR Goal 2.5-3.0.. Will check INR today and adjust accordingly. Long talk about home INR monitoring. Has missed multiple INR appointments  8) RV failure 9) Erectile dysfunction   Admit to 2H for CVP and to diurese. Discussed with Dr Gala Romney. Plan to admit.    I reviewed the LVAD parameters from today, and compared the results to the patient's prior recorded data.  No programming changes were made.  The LVAD is functioning within specified parameters.  The patient performs LVAD self-test daily.  LVAD  interrogation was negative for any significant power changes, alarms or PI events/speed drops.  LVAD equipment check completed and is in good working order.  Back-up equipment present.   LVAD education done on emergency procedures and precautions and reviewed exit site care.  Length of Stay:   CLEGG,AMY 11/08/2014, 4:19 PM  VAD Team Pager [phone] (7am - 7am) +++VAD ISSUES ONLY+++   Advanced Heart Failure Team Pager [phone] (M-F; 7a - 4p)  Please contact [facility] for night-coverage after hours (4p -7a ) and weekends on [email or web address] for all non- LVAD Issues  Patient seen and examined with Tonye Becket, NP. We discussed all aspects of the encounter. I agree with the assessment and plan as stated above.   He has significant volume overload/RV failure with multiple low flow alarms on VAD. Needs to be admitted and diuresed. VAD speed turned down to 9000. Repeat echo. Will place PICC to check CVPs and co-ox.   Bensimhon, Daniel,MD 5:06 PM

## 2014-11-08 NOTE — Patient Instructions (Addendum)
Admit to 2H step down unit for IV diuresis per Dr. Gala Romney and Tonye Becket, NP.

## 2014-11-08 NOTE — Progress Notes (Addendum)
Symptom  Yes  No  Details   Angina         x Activity:   Claudication         x How far:  Right leg with incline  Syncope         x When:    Stroke        x    Orthopnea         x How many pillows: one  PND               x How often:  Once week  CPAP      N/A How many hrs:   Pedal edema        x           Abd fullness        x         "tight"  N&V         x Good appetite;  Diaphoresis         x When:  Bleeding        x   Urine color    light yellow  SOB        x        Activity:  Incline; more fatigue in legs than SOB  Palpitations         x When:  ICD shock         x   Hospitlizaitons                x When/where/why:    ED visit         x When/where/why:  Other MD         x When/who/why:  Activity           Watching TV; 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)  Fluid           No limitations   Diet     No limitations   Vital signs: HR:  84 Doppler MAP:  96 Auto cuff:  105/81 (92) O2 Sat: 98 Wt: 199.8  lbs   Last wt:  182.8  lbs Ht: 5'4"  LVAD interrogation reveals:  Speed:  9200 Flow:  3.7 Power:  4.7 PI:  4.7 Alarms:  5/16 - 2 low flow; 5/14 - 2 low flow; 5/11 7 low flow; 5/12 - 18 low flow; 5/10 6 low flow; 5/8 65 low flow Events:  Rare PI  Fixed speed:  9200 Low speed limit: 8600 Primary Controller:  Replace back up battery in 24 months (03/2016)  [id number]; version V7.23 Back up controller:   Replace back up battery in  24 months (08/2015)  PC [id number]; version V7.23  Primary and back up controller reprogrammed to fixed speed 9000 and low speed limit 8400.    LVAD Exit Site:  VAD dressing removed and site care performed using sterile technique. Drive line exit site cleaned with Chlora prep applicators x 2, allowed to dry, and Sorbaview dressing with biopatch re-applied. Pt reports the driveline "caught on stove" @ 2 - 4 weeks ago and the driveline "came out"; asking if he should "push back in".  Approximately 1 - 2 cm of velour is now  outside of exit site with old bloody drainage noted.  Exit site without redness, tenderness, drainage, or foul odor. Drive line anchor re-applied. Pt denies fever or chills. Driveline dressing is being changed weekly per patient, he has adequate dressing supplies  at home.      Called [facility] for current meds (pt unaware of what he takes and didn't bring meds to clinic visit). She confirms pt increased lasix to 20 mg daily @ 1 week ago.  Also asking for referral to podiatrist; reports pt "toenails are growing into his skin"; Tonye Becket, NP updated.    Pt states he does hear controller alarms "every morning", but when he turns to check controller "the alarm stops".   LVAD equipment check completed and is in good working order. Back-up equipment present. LVAD education done on emergency procedures and precautions and reviewed exit site care.   Transported pt via w/c to 2H17; report given to nurse. VAD cart with system monitor delivered and set up in room. Pt has back up black bag with back up controller, extra battery clips, and extra charged batteries.

## 2014-11-08 NOTE — Progress Notes (Signed)
Patient ID: [name], male   DOB: 1961-03-16, 54 y.o.   MRN: [id number]  Primary Physician: Dr. Oliver Barre  Primary Cardiologist: Dr. Gala Romney  HPI: [name] is a 54 yo male with a history of severe CHF, NICM s/p LVAD and TVR (09/2011), VT, NSTEMI, LV thrombus, CKD, GERD, stroke and LVAD hemolysis with LVAD exchange (08/2013).   He was admitted to [facility] January 2015 with NSTEMI and RV failure. He had a difficult course that was complicated by embolic right CVA. He was intubated and eventually had a tracheostomy placed 07/28/13. He was transferred to inpatient rehab for extensive rehab and was making progression, however despite aggressive therapy with heparin, coumadin, ASA and Plavix, his LDH remained persistently elevated in the 950 range (was about 1750 on admit). He did not experience any pump dysfunction, however the case was discussed with Dr Allena Katz and [name] at [facility] and it was felt to transfer him down there for possible pump exchange. He underwent a pump exchange 08/31/13. Post op was complicated by bleeding and he received multiple transfusions. He had AKI and had intermittent HD and underwent last HD on 09/24/13. Last Cr on chart 1.9 (10/02/13)  Admitted 5/13-5/18/15 for volume overload and possible hemolysis. LDH 647 and INR 1.69 on admission. Treated with IV lasix and IV heparin. While in the hospital developed some PI events and speed was decreased to 9400. ICD interrogated and showed A flutter and he was placed on IV amiodarone and then transitioned to 200 mg PO BID. He cardioverted to NSR on 5/15. LDH trended down and on discharge was 530 and INR 2.4.  In January 2016 admitted with multiple low flow alarms. Diuretics held and given IVF with improvement. Echo showed severe RV dysfunction  Follow up for Heart Failure and LVAD: Overall feeling fair. Taking lasix 20 mg daily. Says he has had multiple low alarms. Denies SOB/PND/ alarms.  No dizziness. Not weighing daily.  INR has been therapeutic but  he is missing appointments. . Working 3 hours at [employer]. He is changing his own dressing weekly. The last 2 times he changed his dressing and the drive line was pulled out to velor. No fevers or chills. Ongoing alcohol intake.   No problems with bleeding or driveline site. No hematuria or dark urine.   SH: Disabled, lives at home with caregiver. No ETOH or smoking FH: Mother living; HTN        Father living; no health issues   No alarms.  Denies driveline trauma, erythema or drainage.  Denies ICD shocks.   Reports taking Coumadin as prescribed and adherence to anticoagulation based dietary restrictions.  Denies bright red blood per rectum or melena, no dark urine or hematuria.     Past Medical History  Diagnosis Date  . CHF (congestive heart failure)     EF- 10-15  . Medically noncompliant   . Mitral regurgitation   . Tobacco user   . HTN (hypertension)   . AICD (automatic cardioverter/defibrillator) present   . GERD (gastroesophageal reflux disease)   . Substance abuse   . Chronic renal insufficiency   . Syncope   . Thrombus 08/06/2010  . SYSTOLIC HEART FAILURE, CHRONIC 09/22/2008    Qualifier: Diagnosis of  By: Gala Romney, MD, Trixie Dredge   . LV (left ventricular) mural thrombus 01/28/2011  . ICD - IN SITU 09/16/2008    Qualifier: Diagnosis of  By: Wonda Amis    . MITRAL STENOSIS/ INSUFFICIENCY, NON-RHEUMATIC 09/22/2008    Qualifier: Diagnosis  of  By: Bensimhon, MD, Trixie Dredge Hepatomegaly 09/16/2008    Qualifier: Diagnosis of  By: Wonda Amis    . High cholesterol 02/26/2012    "at one time"  . Sleep apnea   . Exertional dyspnea 02/26/2012  . History of blood transfusion 08/2011    "when I had heart pump"  . Migraines   . COMMON MIGRAINE 06/14/2009    Qualifier: Diagnosis of  By: Jonny Ruiz MD, Len Blalock   . History of gout 02/26/2012  . Depression 08/11/2013    Pt denies  . Bipolar affective disorder 10/22/2011    pt denies this hx 02/26/2012    Current  Outpatient Prescriptions  Medication Sig Dispense Refill  . aspirin 325 MG EC tablet Take 1 tablet (325 mg total) by mouth daily. 30 tablet 6  . atorvastatin (LIPITOR) 40 MG tablet Take 1 tablet (40 mg total) by mouth daily. 30 tablet 3  . carvedilol (COREG) 3.125 MG tablet Take 1 tablet (3.125 mg total) by mouth 2 (two) times daily with a meal. 60 tablet 6  . clopidogrel (PLAVIX) 75 MG tablet Take 1 tablet (75 mg total) by mouth daily with breakfast. 30 tablet 6  . furosemide (LASIX) 20 MG tablet Take 1 tablet (20 mg total) by mouth every other day. Or as directed (Patient taking differently: Take 20 mg by mouth daily. Or as directed) 60 tablet 6  . hydrALAZINE (APRESOLINE) 50 MG tablet Take 1 tablet (50 mg total) by mouth 3 (three) times daily. 90 tablet 6  . losartan (COZAAR) 100 MG tablet Take 1 tablet (100 mg total) by mouth daily. 30 tablet 6  . potassium chloride SA (K-DUR,KLOR-CON) 20 MEQ tablet Take two tabs daily. May take one extra tab on days you take extra lasix (Patient taking differently: 20 mEq daily. Take two tabs daily. May take one extra tab on days you take extra lasix) 60 tablet 6  . tadalafil (CIALIS) 10 MG tablet Take 1 tablet (10 mg total) by mouth daily as needed for erectile dysfunction. 10 tablet 3  . tamsulosin (FLOMAX) 0.4 MG CAPS capsule Take 1 capsule (0.4 mg total) by mouth daily. 30 capsule 6  . warfarin (COUMADIN) 4 MG tablet Take 1 tablet (4 mg total) by mouth daily. Hold lasix 1/13 and 1/14 then start 4 mg daily on 07/08/14 45 tablet 6   No current facility-administered medications for this encounter.   Facility-Administered Medications Ordered in Other Encounters  Medication Dose Route Frequency Provider Last Rate Last Dose  . sodium chloride 0.9 % injection 10-40 mL  10-40 mL Intracatheter PRN [name], MD        Ace inhibitors and Lexapro  REVIEW OF SYSTEMS: All systems negative except as listed in HPI, PMH and Problem list.   LVAD  INTERROGATION:   Speed: 9200  Flow: 3.8 Power: 4.8   PI: 6.3  Alarms: none  Events: - 2 Low Flow 11/07/14, 11/05/14 2 PI events, 11/03/2014 18 low flow, 511/2016 7 low flows, 5/10/216 6 low flows, 10/30/2014 65 low flow. a  Fixed speed: 9200  Low speed limit: 8600  Primary Controller: Replace back up battery in 26 months (03/2016) [id number]; version V7.23  Back up controller: Replace back up battery in 18 months (08/2015) [id number]; version V7.23   I reviewed the LVAD parameters from today, and compared the results to the patient's prior recorded data. No changes to LVAD speed. The LVAD is functioning within specified parameters.  The patient performs LVAD self-test daily.  LVAD interrogation was negative for any significant power changes, alarms or PI events/speed drops.  LVAD equipment check completed and is in good working order.  Back-up equipment present.   LVAD education done on emergency procedures and precautions and reviewed exit site care.    Filed Vitals:   11/08/14 1438  BP: 105/81  Pulse: 84  Height: 5\' 4"  (1.626 m)  Weight: 199 lb 12.8 oz (90.629 kg)  SpO2: 96%    Physical Exam: GENERAL: Well appearing, male who presents to clinic today HEENT: normal  NECK: Supple, JVP difficult to assess due to body habitus, .2+ bilaterally, no bruits.  No lymphadenopathy or thyromegaly appreciated.   CARDIAC:  Mechanical heart sounds with LVAD hum present, prominent heart sounds LUNGS:  Clear to auscultation bilaterally.  ABDOMEN:  Soft, round, distended, positive bowel sounds x4.     LVAD exit site: Drive line out 1 cm with expose velour. No erythema or drainage.  Stabilization device present and accurately applied. Dressing being changed weekly. EXTREMITIES:  Warm and dry, no cyanosis, clubbing, rash, R and LLE 3+ Cool extremities.  NEUROLOGIC:  Alert and oriented x 4.  Gait steady.  No aphasia.  No dysarthria.  Affect pleasant.      ASSESSMENT AND PLAN:   1) Chronic systolic HF:  NICM, s/p LVAD implant (08/2011) and subsequent (08/2013) NYHAIII - Volume status elevated. Will admit for IV diuresis.  - Continue hydralazine 50 tid -  Reinforced the need and importance of daily weights, a low sodium diet, and fluid restriction (less than 2 L a day). Instructed to call the HF clinic if weight increases more than 3 lbs overnight or 5 lbs in a week.  2) LVAD, s/p (08/2011) and then (08/2013) d/t pump thrombosis - VAD interrogated personally. Cut back speed to 9000 due to multiple low flows.  - Check LDH, CBC, BMET, pro-BNP and INR today. - Not a tx candidate due to compliance issues.  3) HTN - As above. Controlled on hydralazine 50 tid 4) CVA - Stable.Continues to have left sided weakness. No longer in therapy. Continue atorvastatin 40 mg daily.  6) h/o Atrial Flutter: - now in NSR 7) Anticoagulation management - INR Goal 2.5-3.0.. Will check INR today and adjust accordingly. Long talk about home INR monitoring. Has missed multiple INR appointments  8) RV failure  9) Erectile dysfunction   Admit to 2H for CVP and to diurese. Discussed with Dr Gala Romney. Plan to admit.    CLEGG,AMY NP-C  2:30 PM

## 2014-11-08 NOTE — Progress Notes (Signed)
IV team notified that patient has order for PICC line. Informed that PICC may be place on the next shift.

## 2014-11-08 NOTE — Progress Notes (Signed)
Peripherally Inserted Central Catheter/Midline Placement  The IV Nurse has discussed with the patient and/or persons authorized to consent for the patient, the purpose of this procedure and the potential benefits and risks involved with this procedure.  The benefits include less needle sticks, lab draws from the catheter and patient may be discharged home with the catheter.  Risks include, but not limited to, infection, bleeding, blood clot (thrombus formation), and puncture of an artery; nerve damage and irregular heat beat.  Alternatives to this procedure were also discussed.  PICC/Midline Placement Documentation        [name], Lajean Manes 11/08/2014, 9:55 PM

## 2014-11-09 ENCOUNTER — Encounter (HOSPITAL_COMMUNITY): Payer: Self-pay

## 2014-11-09 LAB — BASIC METABOLIC PANEL
Anion gap: 8 (ref 5–15)
BUN: 13 mg/dL (ref 6–20)
CALCIUM: 8.8 mg/dL — AB (ref 8.9–10.3)
CO2: 28 mmol/L (ref 22–32)
CREATININE: 1.18 mg/dL (ref 0.61–1.24)
Chloride: 103 mmol/L (ref 101–111)
GFR calc non Af Amer: >60 mL/min (ref >60)
Glucose, Bld: 107 mg/dL — ABNORMAL HIGH (ref 65–99)
Potassium: 3.5 mmol/L (ref 3.5–5.1)
Sodium: 139 mmol/L (ref 135–145)

## 2014-11-09 LAB — CBC WITH DIFFERENTIAL/PLATELET
BASOS ABS: 0 10*3/uL (ref 0.0–0.1)
BASOS PCT: 1 % (ref 0–1)
EOS ABS: 0.1 10*3/uL (ref 0.0–0.7)
EOS PCT: 5 % (ref 0–5)
HCT: 35.6 % — ABNORMAL LOW (ref 39.0–52.0)
HEMOGLOBIN: 11.5 g/dL — AB (ref 13.0–17.0)
LYMPHS PCT: 24 % (ref 12–46)
Lymphs Abs: 0.7 10*3/uL (ref 0.7–4.0)
MCH: 26.5 pg (ref 26.0–34.0)
MCHC: 32.3 g/dL (ref 30.0–36.0)
MCV: 82 fL (ref 78.0–100.0)
Monocytes Absolute: 0.4 10*3/uL (ref 0.1–1.0)
Monocytes Relative: 16 % — ABNORMAL HIGH (ref 3–12)
NEUTROS ABS: 1.5 10*3/uL — AB (ref 1.7–7.7)
Neutrophils Relative %: 54 % (ref 43–77)
Platelets: 209 10*3/uL (ref 150–400)
RBC: 4.34 MIL/uL (ref 4.22–5.81)
RDW: 19.5 % — AB (ref 11.5–15.5)
WBC: 2.7 10*3/uL — AB (ref 4.0–10.5)

## 2014-11-09 LAB — CARBOXYHEMOGLOBIN
Carboxyhemoglobin: 1.2 % (ref 0.5–1.5)
METHEMOGLOBIN: 0.9 % (ref 0.0–1.5)
O2 Saturation: 73.2 %
Total hemoglobin: 11 g/dL — ABNORMAL LOW (ref 13.5–18.0)

## 2014-11-09 LAB — LACTATE DEHYDROGENASE: LDH: 322 U/L — AB (ref 98–192)

## 2014-11-09 LAB — PROTIME-INR
INR: 2.21 — AB (ref 0.00–1.49)
PROTHROMBIN TIME: 24.7 s — AB (ref 11.6–15.2)

## 2014-11-09 LAB — BRAIN NATRIURETIC PEPTIDE: B Natriuretic Peptide: 595.1 pg/mL — ABNORMAL HIGH (ref 0.0–100.0)

## 2014-11-09 MED ORDER — WARFARIN SODIUM 6 MG PO TABS
6.0000 mg | ORAL_TABLET | Freq: Every day | ORAL | Status: DC
  Filled 2014-11-09: qty 1

## 2014-11-09 MED ORDER — WARFARIN SODIUM 6 MG PO TABS
6.0000 mg | ORAL_TABLET | Freq: Once | ORAL | Status: AC
  Administered 2014-11-09: 6 mg via ORAL
  Filled 2014-11-09: qty 1

## 2014-11-09 MED ORDER — POTASSIUM CHLORIDE CRYS ER 20 MEQ PO TBCR
20.0000 meq | EXTENDED_RELEASE_TABLET | ORAL | Status: AC
  Administered 2014-11-09 (×2): 20 meq via ORAL
  Filled 2014-11-09 (×2): qty 1

## 2014-11-09 MED ORDER — POTASSIUM CHLORIDE CRYS ER 20 MEQ PO TBCR
40.0000 meq | EXTENDED_RELEASE_TABLET | Freq: Once | ORAL | Status: AC
  Administered 2014-11-09: 40 meq via ORAL
  Filled 2014-11-09: qty 2

## 2014-11-09 NOTE — Progress Notes (Signed)
ANTICOAGULATION CONSULT NOTE - Initial Consult  Pharmacy Consult for Warfarin Indication: LVAD  Allergies  Allergen Reactions  . Ace Inhibitors Cough  . Lexapro [Escitalopram Oxalate] Other (See Comments)    somnolence    Patient Measurements: Height: 5\' 4"  (162.6 cm) Weight: 171 lb 11.8 oz (77.9 kg) IBW/kg (Calculated) : 59.2 Heparin Dosing Weight: n/a  Vital Signs: Temp: 98.1 F (36.7 C) (05/18 0400) Temp Source: Oral (05/18 0400)  Labs:  Recent Labs  11/08/14 1355 11/09/14 0327 11/09/14 0328  HGB 13.0 11.5*  --   HCT 40.7 35.6*  --   PLT 216 209  --   LABPROT 23.4* 24.7*  --   INR 2.06* 2.21*  --   CREATININE 1.21  --  1.18    Estimated Creatinine Clearance: 69.9 mL/min (by C-G formula based on Cr of 1.18).   Medical History: Past Medical History  Diagnosis Date  . CHF (congestive heart failure)     EF- 10-15  . Medically noncompliant   . Mitral regurgitation   . Tobacco user   . HTN (hypertension)   . AICD (automatic cardioverter/defibrillator) present   . GERD (gastroesophageal reflux disease)   . Substance abuse   . Chronic renal insufficiency   . Syncope   . Thrombus 08/06/2010  . SYSTOLIC HEART FAILURE, CHRONIC 09/22/2008    Qualifier: Diagnosis of  By: Gala Romney, MD, Trixie Dredge   . LV (left ventricular) mural thrombus 01/28/2011  . ICD - IN SITU 09/16/2008    Qualifier: Diagnosis of  By: Wonda Amis    . MITRAL STENOSIS/ INSUFFICIENCY, NON-RHEUMATIC 09/22/2008    Qualifier: Diagnosis of  By: Gala Romney, MD, Trixie Dredge Hepatomegaly 09/16/2008    Qualifier: Diagnosis of  By: Wonda Amis    . High cholesterol 02/26/2012    "at one time"  . Sleep apnea   . Exertional dyspnea 02/26/2012  . History of blood transfusion 08/2011    "when I had heart pump"  . Migraines   . COMMON MIGRAINE 06/14/2009    Qualifier: Diagnosis of  By: Jonny Ruiz MD, Len Blalock   . History of gout 02/26/2012  . Depression 08/11/2013    Pt denies  .  Bipolar affective disorder 10/22/2011    pt denies this hx 02/26/2012    Assessment: 54 yo male with LVAD, hx pump thrombosis and LVAD exchange in the past. INR on admit below targeted goal of 2.5-3.   PTA dose 4 mg daily, but has missed several INR checks as an outpatient.  INR this am is trending up to 2.2, still below desired goal. No bleeding noted, hgb down from admit 13>>11.5.   Goal of Therapy:  INR 2.5-3 Monitor platelets by anticoagulation protocol: Yes   Plan:  1.Warfarin 6mg  tonight 2. Daily INR  Thanks!  Sheppard Coil PharmD., BCPS Clinical Pharmacist Pager [phone] 11/09/2014 1:48 PM

## 2014-11-09 NOTE — Progress Notes (Signed)
Dr. Shirlee Latch notified of MAP reading tonight of 68. Advised to hold Hydralazine 50mg  tonight. Will monitor closely.

## 2014-11-09 NOTE — Progress Notes (Signed)
[facility] ICU Electrolyte Replacement Protocol  Patient Name: [name] DOB: 29-Dec-1960 MRN: [id number]  Date of Service  11/09/2014   HPI/Events of Note    Recent Labs Lab 11/08/14 1355 11/09/14 0328  NA 141 139  K 3.8 3.5  CL 104 103  CO2 27 28  GLUCOSE 96 107*  BUN 10 13  CREATININE 1.21 1.18  CALCIUM 9.2 8.8*    Estimated Creatinine Clearance: 72.7 mL/min (by C-G formula based on Cr of 1.18).  Intake/Output      05/17 0701 - 05/18 0700   Urine (mL/kg/hr) 3000   Total Output 3000   Net -3000        - I/O DETAILED x24h    Total I/O In: -  Out: 3000 [Urine:3000] - I/O THIS SHIFT    ASSESSMENT   eICURN Interventions   ICU Electrolyte Replacement Protocol criteria met. Labs Replaced per protocol. MD notified   ASSESSMENT: MAJOR ELECTROLYTE    Lorene Dy 11/09/2014, 4:47 AM

## 2014-11-09 NOTE — Progress Notes (Signed)
[id number] Offered to walk with pt who politely declined. [place] Spaniel he was on vacation and resting. Asked pt about watching sodium and he stated that it why I am here. Eating hot dogs and luncheon meat.  Asked pt if he wanted info on sodium restriction and he said no. Stated knows what to eat but less dishes to eat microwaveable things. Encouraged vegetables and fruits. Re enforced 2000 mg restriction and fluid restriction. Encouraged daily weights. Pt stated to notify 5 or 10 pounds. Re enforced 3 overnight and 5 in week. Pt was pleasant but not very concerned re ed. Luetta Nutting RN BSN 11/09/2014 2:26 PM

## 2014-11-09 NOTE — Care Management Note (Signed)
Case Management Note  Patient Details  Name: [name] MRN: [id number] Date of Birth: 02-May-1961  Subjective/Objective:     Adm w heart failure, has vad              Action/Plan: lives alone, pcp dr Oliver Barre   Expected Discharge Date:  11/10/14               Expected Discharge Plan:  Home w Home Health Services  In-House Referral:     Discharge planning Services     Post Acute Care Choice:    Choice offered to:     DME Arranged:    DME Agency:     HH Arranged:    HH Agency:     Status of Service:     Medicare Important Message Given:    Date Medicare IM Given:    Medicare IM give by:    Date Additional Medicare IM Given:    Additional Medicare Important Message give by:     If discussed at Long Length of Stay Meetings, dates discussed:    Additional Comments:ur review done  Hanley Hays, RN 11/09/2014, 8:35 AM

## 2014-11-09 NOTE — Progress Notes (Signed)
HeartMate 2 Rounding Note  Subjective:   Yesterday he was admitted from HF clinic with volume overload and multiple low flows noted on interrogation. Diuresed with IV lasix. Brisk diuresis noted.  Had PICC placed.   Hungry. Denies SOB    LVAD INTERROGATION:  HeartMate II LVAD:  Flow 3.5 liters/min, speed 9000, power 4.4 , PI 7.5  1 PI over night   Objective:    Vital Signs:   Temp:  [98.1 F (36.7 C)-98.3 F (36.8 C)] 98.1 F (36.7 C) (05/18 0400) Pulse Rate:  [80] 80 (05/17 1600) Resp:  [13-29] 13 (05/18 0400) SpO2:  [100 %] 100 % (05/18 0400) Weight:  [171 lb 11.8 oz (77.9 kg)-186 lb 15.2 oz (84.8 kg)] 171 lb 11.8 oz (77.9 kg) (05/18 0500) Last BM Date: 11/07/14 Mean arterial Pressure 92   Intake/Output:   Intake/Output Summary (Last 24 hours) at 11/09/14 0815 Last data filed at 11/09/14 0600  Gross per 24 hour  Intake      0 ml  Output   3875 ml  Net  -3875 ml     Physical Exam: General:  Well appearing. No resp difficulty HEENT: normal Neck: supple. JVP to jaw. C rotids 2+ bilat; no bruits. No lymphadenopathy or thryomegaly appreciated. Cor: Mechanical heart sounds with LVAD hum present. Lungs: clear Abdomen: soft, nontender, distended. No hepatosplenomegaly. No bruits or masses. Good bowel sounds. Driveline: C/D/I; securement device intact and driveline incorporated Extremities: no cyanosis, clubbing, rash,  R and LLE 2+ edema Neuro: alert & orientedx3, cranial nerves grossly intact. moves all 4 extremities w/o difficulty. Affect pleasant  Telemetry: NSR 90s   Labs: Basic Metabolic Panel:  Recent Labs Lab 11/08/14 1355 11/09/14 0328  NA 141 139  K 3.8 3.5  CL 104 103  CO2 27 28  GLUCOSE 96 107*  BUN 10 13  CREATININE 1.21 1.18  CALCIUM 9.2 8.8*    Liver Function Tests: No results for input(s): AST, ALT, ALKPHOS, BILITOT, PROT, ALBUMIN in the last 168 hours. No results for input(s): LIPASE, AMYLASE in the last 168 hours. No results for input(s):  AMMONIA in the last 168 hours.  CBC:  Recent Labs Lab 11/08/14 1355 11/09/14 0327  WBC 3.0* 2.7*  NEUTROABS  --  1.5*  HGB 13.0 11.5*  HCT 40.7 35.6*  MCV 83.2 82.0  PLT 216 209    INR:  Recent Labs Lab 11/03/14 1335 11/08/14 1355 11/09/14 0327  INR 2.60* 2.06* 2.21*    Other results:  EKG:   Imaging: Dg Chest Port 1 View  11/08/2014   CLINICAL DATA:  Respiratory distress. Dyspnea. Left ventricular assist device implanted in 2013.  EXAM: PORTABLE CHEST - 1 VIEW  COMPARISON:  07/05/2014  FINDINGS: There is chronic borderline cardiomegaly. AICD in place. Left ventricular assist device in place.  Pulmonary vascularity is normal. The lungs are clear. Slight chronic pleural thickening at the left lung base laterally. No osseous abnormality.  IMPRESSION: No acute abnormalities.   Electronically Signed   By: Francene Boyers M.D.   On: 11/08/2014 17:31      Medications:     Scheduled Medications: . aspirin  325 mg Oral Daily  . atorvastatin  40 mg Oral Daily  . carvedilol  3.125 mg Oral BID WC  . clopidogrel  75 mg Oral Q breakfast  . furosemide  80 mg Intravenous BID  . hydrALAZINE  50 mg Oral TID  . losartan  100 mg Oral Daily  . potassium chloride  20 mEq  Oral Q4H  . sodium chloride  10-40 mL Intracatheter Q12H  . tamsulosin  0.4 mg Oral Daily  . warfarin  4 mg Oral q1800  . Warfarin - Pharmacist Dosing Inpatient   Does not apply q1800     Infusions:     PRN Medications:  sodium chloride   Assessment/Plan   1)Acute/ Chronic systolic HF/RV failure: NICM, s/p LVAD implant (08/2011) and subsequent (08/2013) NYHAIII. - Volume status elevated. Continue IV lasix. CVP 17 . CO-OX 73%.  - Continue hydralazine 50 tid - Consult cardiac rehab.  2) LVAD, s/p (08/2011) and then (08/2013) d/t pump thrombosis - VAD interrogated personally. Continue speed at 9000. No low flows.   - Check LDH, CBC, BMET, pro-BNP and INR today. - Not a tx candidate due to compliance  issues.  3) HTN - As above. Controlled on hydralazine 50 tid 4) CVA - Stable.. Continue atorvastatin 40 mg daily.  6) h/o Atrial Flutter: - now in NSR 7) Anticoagulation management - INR Goal 2.5-3.0.. INR today 2.2. Pharmacy dosing coumadin  8) RV failure   I reviewed the LVAD parameters from today, and compared the results to the patient's prior recorded data.  No programming changes were made.  The LVAD is functioning within specified parameters.  The patient performs LVAD self-test daily.  LVAD interrogation was negative for any significant power changes, alarms or PI events/speed drops.  LVAD equipment check completed and is in good working order.  Back-up equipment present.   LVAD education done on emergency procedures and precautions and reviewed exit site care.  Length of Stay: 1  CLEGG,AMY NP-C  11/09/2014, 8:15 AM  VAD Team --- VAD ISSUES ONLY--- Pager [phone] (7am - 7am)  Advanced Heart Failure Team  Pager [phone] (M-F; 7a - 4p)  Please contact [facility] for night-coverage after hours (4p -7a ) and weekends on [email or web address]  Patient seen and examined with Tonye Becket, NP. We discussed all aspects of the encounter. I agree with the assessment and plan as stated above.   Brisk diuresis with IV lasix. Co-ox good despite RV failure. Continue diuresis. Supp K+. VAD parameters stable.   Continue diuresis.   Carlisle Enke,MD 1:28 PM

## 2014-11-10 LAB — BASIC METABOLIC PANEL
ANION GAP: 10 (ref 5–15)
BUN: 15 mg/dL (ref 6–20)
CALCIUM: 8.5 mg/dL — AB (ref 8.9–10.3)
CO2: 28 mmol/L (ref 22–32)
Chloride: 99 mmol/L — ABNORMAL LOW (ref 101–111)
Creatinine, Ser: 1.34 mg/dL — ABNORMAL HIGH (ref 0.61–1.24)
GFR calc Af Amer: >60 mL/min (ref >60)
GFR calc non Af Amer: 60 mL/min — ABNORMAL LOW (ref >60)
Glucose, Bld: 107 mg/dL — ABNORMAL HIGH (ref 65–99)
POTASSIUM: 4 mmol/L (ref 3.5–5.1)
SODIUM: 137 mmol/L (ref 135–145)

## 2014-11-10 LAB — PROTIME-INR
INR: 2.01 — AB (ref 0.00–1.49)
Prothrombin Time: 23 seconds — ABNORMAL HIGH (ref 11.6–15.2)

## 2014-11-10 LAB — CBC
HCT: 40.3 % (ref 39.0–52.0)
Hemoglobin: 13.2 g/dL (ref 13.0–17.0)
MCH: 26.8 pg (ref 26.0–34.0)
MCHC: 32.8 g/dL (ref 30.0–36.0)
MCV: 81.7 fL (ref 78.0–100.0)
Platelets: 242 10*3/uL (ref 150–400)
RBC: 4.93 MIL/uL (ref 4.22–5.81)
RDW: 19.2 % — ABNORMAL HIGH (ref 11.5–15.5)
WBC: 2.9 10*3/uL — AB (ref 4.0–10.5)

## 2014-11-10 LAB — LACTATE DEHYDROGENASE: LDH: 313 U/L — ABNORMAL HIGH (ref 98–192)

## 2014-11-10 MED ORDER — FUROSEMIDE 20 MG PO TABS: 20.0000 mg | ORAL_TABLET | Freq: Every day | ORAL | Status: DC

## 2014-11-10 MED ORDER — POTASSIUM CHLORIDE CRYS ER 20 MEQ PO TBCR: 40.0000 meq | EXTENDED_RELEASE_TABLET | Freq: Two times a day (BID) | ORAL | Status: DC

## 2014-11-10 MED ORDER — WARFARIN SODIUM 7.5 MG PO TABS
7.5000 mg | ORAL_TABLET | Freq: Once | ORAL | Status: DC
  Filled 2014-11-10: qty 1

## 2014-11-10 MED ORDER — WARFARIN SODIUM 7.5 MG PO TABS
7.5000 mg | ORAL_TABLET | ORAL | Status: AC
  Administered 2014-11-10: 7.5 mg via ORAL
  Filled 2014-11-10: qty 1

## 2014-11-10 NOTE — Progress Notes (Signed)
Admitted 11/08/14 due to Low Flow Alarms/Volume Overload.   HeartMate II LVAD implated by [facility] for DT therapy 02/16/12 with pump exchange 08/31/13 for thrombosis.    Up in his bed reading the newspaper this morning. Reports that he is feeling better with less fluid on him. Discussed low flow alarms and how we are thinking they are positional since about all of them occur early in the morning during sleep and position changes.   Vital signs: Afebrile HR: 80 - 90s NSR w/ PVCs Doppler MAP: 68 - 96 O2 Sat: 98 - 100%    Wt: 175 (Admit weight 186, up 4 lb from yesterday)        LVAD interrogation reveals:  Speed: 8990 Flow: 3.7 Power: 4.5 PI: 3.7 Alarms: 2 low flow alarms (2.4 lpm) at 0530 & 0630 this morning with PI events >2h prior to that.  Events: 5 PI events throughout night  Fixed speed: 9000 Low speed limit: 8400  Drive Line: Unremarkable. Does not need dressing change until next week.    Labs:  LDH trend: 322 > 313  INR trend: 2.21 > 2.01  Plan/Recommendations:  Possible discharge home later today with follow up in the clinic per Dr. Prescott Gum note. Not nearly as many low flow alarms on current speed setting of 9000. Based on timing of alarms, suspect that position is contributing heavily.   Flows are consistently pretty low baseline hovering 3.2 - 4 so threshold to low flow alarm trigger is low. PIs are variable at his baseline ranging from 4.3 - 6.8 without other symptoms; do not correlate with higher MAPs either.   Creatinine bumped a little today 1.34 despite Co-ox of 73% yesterday. Follow in clinic if D/C'd today with medical team.   INR therapeutic--difficult to manage him outpatient due to transportation issues and frequently misses appointments. Unable to have home monitor d/t Medicaid.

## 2014-11-10 NOTE — Progress Notes (Signed)
HeartMate 2 Rounding Note  Subjective:   Admitted from HF clinic with volume overload and multiple low flows noted on interrogation. Diuresed with IV lasix. Brisk diuresis noted.  Had PICC placed.   Last night MAP 68. Hydralazine held.   Denies SOB    LVAD INTERROGATION:  HeartMate II LVAD:  Flow 3.4 liters/min, speed 9000, power 5 , PI  4.4  2 low flows this am 3 PI over night   Objective:    Vital Signs:   Temp:  [97.4 F (36.3 C)-98.5 F (36.9 C)] 98.5 F (36.9 C) (05/19 0830) Pulse Rate:  [90-100] 90 (05/19 0830) Resp:  [12-24] 19 (05/19 0830) SpO2:  [97 %-100 %] 97 % (05/19 0830) Weight:  [175 lb 4.3 oz (79.5 kg)] 175 lb 4.3 oz (79.5 kg) (05/19 0324) Last BM Date: 11/09/14 Mean arterial Pressure 96  Intake/Output:   Intake/Output Summary (Last 24 hours) at 11/10/14 0845 Last data filed at 11/10/14 0800  Gross per 24 hour  Intake    520 ml  Output   3360 ml  Net  -2840 ml     Physical Exam:CVP 11-12 General:  Well appearing. No resp difficulty HEENT: normal Neck: supple. JVP ~10 . Carotids 2+ bilat; no bruits. No lymphadenopathy or thryomegaly appreciated. Cor: Mechanical heart sounds with LVAD hum present. Lungs: clear Abdomen: soft, nontender, distended. No hepatosplenomegaly. No bruits or masses. Good bowel sounds. Driveline: C/D/I; securement device intact and driveline incorporated Extremities: no cyanosis, clubbing, rash,  R and LLE  Trace-1+ edema Neuro: alert & orientedx3, cranial nerves grossly intact. moves all 4 extremities w/o difficulty. Affect pleasant  Telemetry: NSR 90s   Labs: Basic Metabolic Panel:  Recent Labs Lab 11/08/14 1355 11/09/14 0328 11/10/14 0410  NA 141 139 137  K 3.8 3.5 4.0  CL 104 103 99*  CO2 27 28 28   GLUCOSE 96 107* 107*  BUN 10 13 15   CREATININE 1.21 1.18 1.34*  CALCIUM 9.2 8.8* 8.5*    Liver Function Tests: No results for input(s): AST, ALT, ALKPHOS, BILITOT, PROT, ALBUMIN in the last 168 hours. No results  for input(s): LIPASE, AMYLASE in the last 168 hours. No results for input(s): AMMONIA in the last 168 hours.  CBC:  Recent Labs Lab 11/08/14 1355 11/09/14 0327  WBC 3.0* 2.7*  NEUTROABS  --  1.5*  HGB 13.0 11.5*  HCT 40.7 35.6*  MCV 83.2 82.0  PLT 216 209    INR:  Recent Labs Lab 11/03/14 1335 11/08/14 1355 11/09/14 0327 11/10/14 0410  INR 2.60* 2.06* 2.21* 2.01*    Other results:  EKG:   Imaging: Dg Chest Port 1 View  11/08/2014   CLINICAL DATA:  Respiratory distress. Dyspnea. Left ventricular assist device implanted in 2013.  EXAM: PORTABLE CHEST - 1 VIEW  COMPARISON:  07/05/2014  FINDINGS: There is chronic borderline cardiomegaly. AICD in place. Left ventricular assist device in place.  Pulmonary vascularity is normal. The lungs are clear. Slight chronic pleural thickening at the left lung base laterally. No osseous abnormality.  IMPRESSION: No acute abnormalities.   Electronically Signed   By: Francene Boyers M.D.   On: 11/08/2014 17:31     Medications:     Scheduled Medications: . aspirin  325 mg Oral Daily  . atorvastatin  40 mg Oral Daily  . carvedilol  3.125 mg Oral BID WC  . clopidogrel  75 mg Oral Q breakfast  . furosemide  80 mg Intravenous BID  . hydrALAZINE  50 mg Oral  TID  . losartan  100 mg Oral Daily  . sodium chloride  10-40 mL Intracatheter Q12H  . tamsulosin  0.4 mg Oral Daily  . Warfarin - Pharmacist Dosing Inpatient   Does not apply q1800    Infusions:    PRN Medications: sodium chloride   Assessment/Plan   1)Acute/ Chronic systolic HF/RV failure: NICM, s/p LVAD implant (08/2011) and subsequent (08/2013) NYHAIII. - Volume status improving. Give another dose of IV lasix. CVP 11-12.  CO-OX 73%.  - Continue hydralazine 50 tid - Consult cardiac rehab.  2) LVAD, s/p (08/2011) and then (08/2013) d/t pump thrombosis - VAD interrogated personally. Continue speed at 9000. 2 low flows.   - Not a tx candidate due to compliance issues.  3)  HTN - As above. Controlled on hydralazine 50 tid 4) CVA - Stable. Continue atorvastatin 40 mg daily.  6) h/o Atrial Flutter: - now in NSR 7) Anticoagulation management - INR Goal 2.5-3.0.. INR today 2.0. Pharmacy dosing coumadin  8) RV failure   I reviewed the LVAD parameters from today, and compared the results to the patient's prior recorded data.  No programming changes were made.  The LVAD is functioning within specified parameters.  The patient performs LVAD self-test daily.  LVAD interrogation was negative for any significant power changes, alarms or PI events/speed drops.  LVAD equipment check completed and is in good working order.  Back-up equipment present.   LVAD education done on emergency procedures and precautions and reviewed exit site care.  Length of Stay: 2  CLEGG,AMY NP-C  11/10/2014, 8:45 AM  VAD Team --- VAD ISSUES ONLY--- Pager [phone] (7am - 7am)  Advanced Heart Failure Team  Pager [phone] (M-F; 7a - 4p)  Please contact [facility] for night-coverage after hours (4p -7a ) and weekends on [email or web address]   Patient seen and examined with Tonye Becket, NP. We discussed all aspects of the encounter. I agree with the assessment and plan as stated above.   Volume status much improved. CVP 11 which is about as good as we can get with his RV failure. PI levels are fine.Continues to have intermittent low flow alarms in the am not associated with PI events. I suspect these are mechanical in nature and occur when he is rolling over. Will not change speed now.   Can go home later today.  Victory Strollo,MD 9:43 AM

## 2014-11-10 NOTE — Discharge Summary (Signed)
Advanced Heart Failure Team  Discharge Summary   Patient ID: [name] MRN: [id number], DOB/AGE: [birth year] 52 y.o. Admit date: 11/08/2014 D/C date:     11/10/2014   Primary Discharge Diagnoses:  1)Acute/ Chronic systolic HF/RV failure: NICM, s/p LVAD implant (08/2011) and subsequent (08/2013) 2) LVAD, s/p (08/2011) and then (08/2013) d/t pump thrombosis 3) HTN 4) CVA  Continue atorvastatin 40 mg daily.  6) h/o Atrial Flutter: 7) Anticoagulation management - INR Goal 2.5.0.Marland Kitchen INR today 2.0. Pharmacy dosing coumadin  8) RV failure   Hospital Course:  [name] is a 54 yo male with a history of severe CHF, NICM s/p LVAD and TVR (09/2011), VT, NSTEMI, LV thrombus, CKD, GERD, stroke and LVAD hemolysis with LVAD exchange (08/2013). In June 2015 had NSTEM/RV failure that was complicated by embolic right CVA.   Admitted from LVAD/HF clinic with NYHA IIII/IV symptoms and volume overload. Also had multiple low flow alarms. His weigh was up 17 pounds. PICC line was placed for CVPs to guide diuresis. He was diuresed with IV lasix and transitioned to lasix 20 mg daily. Overall he diuresed 11 pounds. Renal function followed closely and remained stable.  LVAD interrogated with fewer low flows as he diuresed. Some low flows may related to postioning because the majority occur early in the morning. Will follow up next week to reassess. For now we will leave speed at 9000.   INR followed closely and with adjustments made. Difficulty to manage INR in the community because of missed appointments.  He will follow up next week in the LVAD/HF clinic. Plan to check LDH, CBC, BMET, INR at that time.   LVAD INTERROGATION:  HeartMate II LVAD: Flow 3.4 liters/min, speed 9000, power 5 , PI 4.4 2 low flows this am 3 PI over night  Discharge Weight Range: 175 pounds  Discharge Vitals: Pulse 91, temperature 96.9 F (36.1 C), temperature source Axillary, resp. rate 18, height 5\' 4"  (1.626 m), weight 175 lb 4.3 oz  (79.5 kg), SpO2 98 %.  Labs: Lab Results  Component Value Date   WBC 2.9* 11/10/2014   HGB 13.2 11/10/2014   HCT 40.3 11/10/2014   MCV 81.7 11/10/2014   PLT 242 11/10/2014    Recent Labs Lab 11/10/14 0410  NA 137  K 4.0  CL 99*  CO2 28  BUN 15  CREATININE 1.34*  CALCIUM 8.5*  GLUCOSE 107*   Lab Results  Component Value Date   CHOL 166 07/20/2013   HDL 63 07/20/2013   LDLCALC 84 07/20/2013   TRIG 96 07/20/2013   BNP (last 3 results)  Recent Labs  09/08/14 1200 11/08/14 1355 11/09/14 0329  BNP 264.9* 618.2* 595.1*    ProBNP (last 3 results)  Recent Labs  03/31/14 0950 05/04/14 1030 05/09/14 1052  PROBNP 634.7* 312.0* 1914.0*     Diagnostic Studies/Procedures   Dg Chest Port 1 View  11/08/2014   CLINICAL DATA:  Respiratory distress. Dyspnea. Left ventricular assist device implanted in 2013.  EXAM: PORTABLE CHEST - 1 VIEW  COMPARISON:  07/05/2014  FINDINGS: There is chronic borderline cardiomegaly. AICD in place. Left ventricular assist device in place.  Pulmonary vascularity is normal. The lungs are clear. Slight chronic pleural thickening at the left lung base laterally. No osseous abnormality.  IMPRESSION: No acute abnormalities.   Electronically Signed   By: Francene Boyers M.D.   On: 11/08/2014 17:31    Discharge Medications     Medication List    TAKE these medications  aspirin 325 MG EC tablet  Take 1 tablet (325 mg total) by mouth daily.     atorvastatin 40 MG tablet  Commonly known as:  LIPITOR  Take 1 tablet (40 mg total) by mouth daily.     carvedilol 3.125 MG tablet  Commonly known as:  COREG  Take 1 tablet (3.125 mg total) by mouth 2 (two) times daily with a meal.     clopidogrel 75 MG tablet  Commonly known as:  PLAVIX  Take 1 tablet (75 mg total) by mouth daily with breakfast.     furosemide 20 MG tablet  Commonly known as:  LASIX  Take 1 tablet (20 mg total) by mouth daily. Or as directed     hydrALAZINE 50 MG tablet   Commonly known as:  APRESOLINE  Take 1 tablet (50 mg total) by mouth 3 (three) times daily.     losartan 100 MG tablet  Commonly known as:  COZAAR  Take 1 tablet (100 mg total) by mouth daily.     potassium chloride SA 20 MEQ tablet  Commonly known as:  K-DUR,KLOR-CON  Take 2 tablets (40 mEq total) by mouth 2 (two) times daily.     tamsulosin 0.4 MG Caps capsule  Commonly known as:  FLOMAX  Take 1 capsule (0.4 mg total) by mouth daily.     tetrahydrozoline-zinc 0.05-0.25 % ophthalmic solution  Commonly known as:  VISINE-AC  Place 2 drops into both eyes daily as needed (red eyes).     warfarin 4 MG tablet  Commonly known as:  COUMADIN  Take 1 tablet (4 mg total) by mouth daily. Hold lasix 1/13 and 1/14 then start 4 mg daily on 07/08/14        Disposition   The patient will be discharged in stable condition to home.     Discharge Instructions    ACE Inhibitor / ARB already ordered    Complete by:  As directed      Diet - low sodium heart healthy    Complete by:  As directed      Heart Failure patients record your daily weight using the same scale at the same time of day    Complete by:  As directed      Increase activity slowly    Complete by:  As directed           Follow-up Information    Follow up with Arvilla Meres, MD On 11/16/2014.   Specialty:  Cardiology   Why:  at 1:00   Contact information:   [address] [place] [address] [phone]         Duration of Discharge Encounter: Greater than 35 minutes   Signed, CLEGG,AMY NP-C  11/10/2014, 1:28 PM   Patient seen and examined with Tonye Becket, NP. We discussed all aspects of the encounter. I agree with the assessment and plan as stated above. He is ready for d/c. Stop ASA. Follow INR closely.   Jaylani Mcguinn,MD 11:41 PM

## 2014-11-10 NOTE — Progress Notes (Signed)
ANTICOAGULATION CONSULT NOTE  Pharmacy Consult for Warfarin Indication: LVAD  Allergies  Allergen Reactions  . Ace Inhibitors Cough  . Lexapro [Escitalopram Oxalate] Other (See Comments)    somnolence    Patient Measurements: Height: 5\' 4"  (162.6 cm) Weight: 175 lb 4.3 oz (79.5 kg) IBW/kg (Calculated) : 59.2 Heparin Dosing Weight: n/a  Vital Signs: Temp: 98.5 F (36.9 C) (05/19 0830) Temp Source: Oral (05/19 0830) Pulse Rate: 90 (05/19 0830)  Labs:  Recent Labs  11/08/14 1355 11/09/14 0327 11/09/14 0328 11/10/14 0410  HGB 13.0 11.5*  --   --   HCT 40.7 35.6*  --   --   PLT 216 209  --   --   LABPROT 23.4* 24.7*  --  23.0*  INR 2.06* 2.21*  --  2.01*  CREATININE 1.21  --  1.18 1.34*    Estimated Creatinine Clearance: 62.1 mL/min (by C-G formula based on Cr of 1.34).   Medical History: Past Medical History  Diagnosis Date  . CHF (congestive heart failure)     EF- 10-15  . Medically noncompliant   . Mitral regurgitation   . Tobacco user   . HTN (hypertension)   . AICD (automatic cardioverter/defibrillator) present   . GERD (gastroesophageal reflux disease)   . Substance abuse   . Chronic renal insufficiency   . Syncope   . Thrombus 08/06/2010  . SYSTOLIC HEART FAILURE, CHRONIC 09/22/2008    Qualifier: Diagnosis of  By: Gala Romney, MD, Trixie Dredge   . LV (left ventricular) mural thrombus 01/28/2011  . ICD - IN SITU 09/16/2008    Qualifier: Diagnosis of  By: Wonda Amis    . MITRAL STENOSIS/ INSUFFICIENCY, NON-RHEUMATIC 09/22/2008    Qualifier: Diagnosis of  By: Gala Romney, MD, Trixie Dredge Hepatomegaly 09/16/2008    Qualifier: Diagnosis of  By: Wonda Amis    . High cholesterol 02/26/2012    "at one time"  . Sleep apnea   . Exertional dyspnea 02/26/2012  . History of blood transfusion 08/2011    "when I had heart pump"  . Migraines   . COMMON MIGRAINE 06/14/2009    Qualifier: Diagnosis of  By: Jonny Ruiz MD, Len Blalock   . History of gout  02/26/2012  . Depression 08/11/2013    Pt denies  . Bipolar affective disorder 10/22/2011    pt denies this hx 02/26/2012    Assessment: 54 yo male with LVAD, hx pump thrombosis and LVAD exchange in the past. INR on admit below targeted goal of 2.5-3.   PTA dose 4 mg daily, but has missed several INR checks as an outpatient.  INR this am is trending down this am after extra given last night (2.0), still below desired goal. No bleeding noted, hgb down from admit 13>>11.5, no cbc this am will check now. Will give extra coumadin tonight.  Goal of Therapy:  INR 2.5-3 Monitor platelets by anticoagulation protocol: Yes   Plan:  1.Warfarin 7.5 mg tonight - if sent home would give 7.5mg  or 8mg  tonight (if he has 4mg  tabs at home then 6mg  on 5/20 then back to home dose of 4mg  daily with recheck next week at follow up). 2. Daily INR  Thanks!  Sheppard Coil PharmD., BCPS Clinical Pharmacist Pager [phone] 11/10/2014 11:25 AM

## 2014-11-16 ENCOUNTER — Ambulatory Visit (HOSPITAL_COMMUNITY)
Admit: 2014-11-16 | Discharge: 2014-11-16 | Disposition: A | Discharge status: Home / Self Care | Payer: Medicare Other | Source: Ambulatory Visit | Attending: Internal Medicine | Admitting: Internal Medicine

## 2014-11-16 ENCOUNTER — Other Ambulatory Visit (HOSPITAL_COMMUNITY): Payer: Self-pay | Admitting: *Deleted

## 2014-11-16 ENCOUNTER — Ambulatory Visit (HOSPITAL_COMMUNITY): Payer: Self-pay | Admitting: *Deleted

## 2014-11-16 VITALS — BP 84/0 | HR 80 | Ht 64.0 in | Wt 185.2 lb

## 2014-11-16 DIAGNOSIS — Z7901 Long term (current) use of anticoagulants: Secondary | ICD-10-CM | POA: Diagnosis not present

## 2014-11-16 DIAGNOSIS — I429 Cardiomyopathy, unspecified: Secondary | ICD-10-CM | POA: Diagnosis not present

## 2014-11-16 DIAGNOSIS — Z8639 Personal history of other endocrine, nutritional and metabolic disease: Secondary | ICD-10-CM | POA: Insufficient documentation

## 2014-11-16 DIAGNOSIS — E780 Pure hypercholesterolemia: Secondary | ICD-10-CM | POA: Insufficient documentation

## 2014-11-16 DIAGNOSIS — K219 Gastro-esophageal reflux disease without esophagitis: Secondary | ICD-10-CM | POA: Insufficient documentation

## 2014-11-16 DIAGNOSIS — I69354 Hemiplegia and hemiparesis following cerebral infarction affecting left non-dominant side: Secondary | ICD-10-CM | POA: Insufficient documentation

## 2014-11-16 DIAGNOSIS — N529 Male erectile dysfunction, unspecified: Secondary | ICD-10-CM | POA: Diagnosis not present

## 2014-11-16 DIAGNOSIS — Z95811 Presence of heart assist device: Secondary | ICD-10-CM

## 2014-11-16 DIAGNOSIS — I129 Hypertensive chronic kidney disease with stage 1 through stage 4 chronic kidney disease, or unspecified chronic kidney disease: Secondary | ICD-10-CM | POA: Insufficient documentation

## 2014-11-16 DIAGNOSIS — I10 Essential (primary) hypertension: Secondary | ICD-10-CM

## 2014-11-16 DIAGNOSIS — Z8249 Family history of ischemic heart disease and other diseases of the circulatory system: Secondary | ICD-10-CM | POA: Diagnosis not present

## 2014-11-16 DIAGNOSIS — I5023 Acute on chronic systolic (congestive) heart failure: Secondary | ICD-10-CM

## 2014-11-16 DIAGNOSIS — Z7982 Long term (current) use of aspirin: Secondary | ICD-10-CM | POA: Diagnosis not present

## 2014-11-16 DIAGNOSIS — R0600 Dyspnea, unspecified: Secondary | ICD-10-CM | POA: Diagnosis not present

## 2014-11-16 DIAGNOSIS — I159 Secondary hypertension, unspecified: Secondary | ICD-10-CM

## 2014-11-16 DIAGNOSIS — I4892 Unspecified atrial flutter: Secondary | ICD-10-CM | POA: Insufficient documentation

## 2014-11-16 DIAGNOSIS — I5022 Chronic systolic (congestive) heart failure: Secondary | ICD-10-CM | POA: Insufficient documentation

## 2014-11-16 DIAGNOSIS — Z7902 Long term (current) use of antithrombotics/antiplatelets: Secondary | ICD-10-CM | POA: Diagnosis not present

## 2014-11-16 DIAGNOSIS — N189 Chronic kidney disease, unspecified: Secondary | ICD-10-CM | POA: Diagnosis not present

## 2014-11-16 DIAGNOSIS — I471 Supraventricular tachycardia: Secondary | ICD-10-CM

## 2014-11-16 DIAGNOSIS — Z79899 Other long term (current) drug therapy: Secondary | ICD-10-CM | POA: Diagnosis not present

## 2014-11-16 LAB — PROTIME-INR
INR: 2.44 — ABNORMAL HIGH (ref 0.00–1.49)
Prothrombin Time: 26.2 seconds — ABNORMAL HIGH (ref 11.6–15.2)

## 2014-11-16 LAB — CBC
HCT: 42.5 % (ref 39.0–52.0)
HEMOGLOBIN: 13.5 g/dL (ref 13.0–17.0)
MCH: 26.6 pg (ref 26.0–34.0)
MCHC: 31.8 g/dL (ref 30.0–36.0)
MCV: 83.7 fL (ref 78.0–100.0)
PLATELETS: 243 10*3/uL (ref 150–400)
RBC: 5.08 MIL/uL (ref 4.22–5.81)
RDW: 18.4 % — ABNORMAL HIGH (ref 11.5–15.5)
WBC: 2.8 10*3/uL — ABNORMAL LOW (ref 4.0–10.5)

## 2014-11-16 LAB — BASIC METABOLIC PANEL
ANION GAP: 10 (ref 5–15)
BUN: 19 mg/dL (ref 6–20)
CO2: 24 mmol/L (ref 22–32)
CREATININE: 1.11 mg/dL (ref 0.61–1.24)
Calcium: 9.3 mg/dL (ref 8.9–10.3)
Chloride: 103 mmol/L (ref 101–111)
GLUCOSE: 88 mg/dL (ref 65–99)
POTASSIUM: 4.3 mmol/L (ref 3.5–5.1)
SODIUM: 137 mmol/L (ref 135–145)

## 2014-11-16 LAB — LACTATE DEHYDROGENASE: LDH: 343 U/L — ABNORMAL HIGH (ref 98–192)

## 2014-11-16 LAB — BRAIN NATRIURETIC PEPTIDE: B Natriuretic Peptide: 221.2 pg/mL — ABNORMAL HIGH (ref 0.0–100.0)

## 2014-11-16 MED ORDER — HYDRALAZINE HCL 50 MG PO TABS: 50.0000 mg | ORAL_TABLET | Freq: Three times a day (TID) | ORAL | Status: DC

## 2014-11-16 MED ORDER — FUROSEMIDE 20 MG PO TABS: 40.0000 mg | ORAL_TABLET | Freq: Every day | ORAL | Status: DC

## 2014-11-16 NOTE — Progress Notes (Signed)
Patient ID: [name], male   DOB: 04/20/1961, 54 y.o.   MRN: [id number]  Primary Physician: Dr. Oliver Barre  Primary Cardiologist: Dr. Gala Romney  HPI: [name] is a 54 yo male with a history of severe CHF, NICM s/p LVAD and TVR (09/2011), VT, NSTEMI, LV thrombus, CKD, GERD, stroke and LVAD hemolysis with LVAD exchange (08/2013).   He was admitted to [facility] January 2015 with NSTEMI and RV failure. He had a difficult course that was complicated by embolic right CVA. He was intubated and eventually had a tracheostomy placed 07/28/13. He was transferred to inpatient rehab for extensive rehab and was making progression, however despite aggressive therapy with heparin, coumadin, ASA and Plavix, his LDH remained persistently elevated in the 950 range (was about 1750 on admit). He did not experience any pump dysfunction, however the case was discussed with Dr Allena Katz and [name] at [facility] and it was felt to transfer him down there for possible pump exchange. He underwent a pump exchange 08/31/13. Post op was complicated by bleeding and he received multiple transfusions. He had AKI and had intermittent HD and underwent last HD on 09/24/13. Last Cr on chart 1.9 (10/02/13)  Admitted 5/13-5/18/15 for volume overload and possible hemolysis. LDH 647 and INR 1.69 on admission. Treated with IV lasix and IV heparin. While in the hospital developed some PI events and speed was decreased to 9400. ICD interrogated and showed A flutter and he was placed on IV amiodarone and then transitioned to 200 mg PO BID. He cardioverted to NSR on 5/15. LDH trended down and on discharge was 530 and INR 2.4.  In January 2016 admitted with multiple low flow alarms. Diuretics held and given IVF with improvement. Echo showed severe RV dysfunction  Admitted May 17th from HF.LVAD clinic with volume overload. Diuresed with IV lasix. Discharge weight was 175 pounds.    Post Hospital Follow up for Heart Failure and LVAD: Overall feeling fair. Taking lasix 20  mg daily. Says he has had multiple low alarms. Denies SOB/PND/ alarms.  No dizziness. Not weighing daily.  INR has been therapeutic but he is missing appointments. . Working 3 hours at [employer]. He is changing his own dressing weekly. The last 2 times he changed his dressing and the drive line was pulled out to velor. No fevers or chills. Ongoing alcohol intake.   No problems with bleeding or driveline site. No hematuria or dark urine.   SH: Disabled, lives at home with caregiver. No ETOH or smoking FH: Mother living; HTN        Father living; no health issues   No alarms.  Denies driveline trauma, erythema or drainage.  Denies ICD shocks.   Reports taking Coumadin as prescribed and adherence to anticoagulation based dietary restrictions.  Denies bright red blood per rectum or melena, no dark urine or hematuria.     Past Medical History  Diagnosis Date  . CHF (congestive heart failure)     EF- 10-15  . Medically noncompliant   . Mitral regurgitation   . Tobacco user   . HTN (hypertension)   . AICD (automatic cardioverter/defibrillator) present   . GERD (gastroesophageal reflux disease)   . Substance abuse   . Chronic renal insufficiency   . Syncope   . Thrombus 08/06/2010  . SYSTOLIC HEART FAILURE, CHRONIC 09/22/2008    Qualifier: Diagnosis of  By: Gala Romney, MD, Trixie Dredge   . LV (left ventricular) mural thrombus 01/28/2011  . ICD - IN SITU 09/16/2008  Qualifier: Diagnosis of  By: Wonda Amis    . MITRAL STENOSIS/ INSUFFICIENCY, NON-RHEUMATIC 09/22/2008    Qualifier: Diagnosis of  By: Gala Romney, MD, Trixie Dredge Hepatomegaly 09/16/2008    Qualifier: Diagnosis of  By: Wonda Amis    . High cholesterol 02/26/2012    "at one time"  . Sleep apnea   . Exertional dyspnea 02/26/2012  . History of blood transfusion 08/2011    "when I had heart pump"  . Migraines   . COMMON MIGRAINE 06/14/2009    Qualifier: Diagnosis of  By: Jonny Ruiz MD, Len Blalock   . History of gout  02/26/2012  . Depression 08/11/2013    Pt denies  . Bipolar affective disorder 10/22/2011    pt denies this hx 02/26/2012    Current Outpatient Prescriptions  Medication Sig Dispense Refill  . aspirin 325 MG EC tablet Take 1 tablet (325 mg total) by mouth daily. 30 tablet 6  . atorvastatin (LIPITOR) 40 MG tablet Take 1 tablet (40 mg total) by mouth daily. 30 tablet 3  . carvedilol (COREG) 3.125 MG tablet Take 1 tablet (3.125 mg total) by mouth 2 (two) times daily with a meal. 60 tablet 6  . clopidogrel (PLAVIX) 75 MG tablet Take 1 tablet (75 mg total) by mouth daily with breakfast. 30 tablet 6  . furosemide (LASIX) 20 MG tablet Take 1 tablet (20 mg total) by mouth daily. Or as directed 60 tablet 6  . hydrALAZINE (APRESOLINE) 50 MG tablet Take 1 tablet (50 mg total) by mouth 3 (three) times daily. 90 tablet 6  . losartan (COZAAR) 100 MG tablet Take 1 tablet (100 mg total) by mouth daily. 30 tablet 6  . potassium chloride SA (K-DUR,KLOR-CON) 20 MEQ tablet Take 2 tablets (40 mEq total) by mouth 2 (two) times daily. 60 tablet 6  . tamsulosin (FLOMAX) 0.4 MG CAPS capsule Take 1 capsule (0.4 mg total) by mouth daily. 30 capsule 6  . tetrahydrozoline-zinc (VISINE-AC) 0.05-0.25 % ophthalmic solution Place 2 drops into both eyes daily as needed (red eyes).    . warfarin (COUMADIN) 4 MG tablet Take 1 tablet (4 mg total) by mouth daily. Hold lasix 1/13 and 1/14 then start 4 mg daily on 07/08/14 (Patient taking differently: Take 4 mg by mouth daily at 6 PM. ) 45 tablet 6   No current facility-administered medications for this encounter.   Facility-Administered Medications Ordered in Other Encounters  Medication Dose Route Frequency Provider Last Rate Last Dose  . sodium chloride 0.9 % injection 10-40 mL  10-40 mL Intracatheter PRN [name], MD        Ace inhibitors and Lexapro  REVIEW OF SYSTEMS: All systems negative except as listed in HPI, PMH and Problem list.   LVAD INTERROGATION:    Speed: 9000  Flow: 3.0 Power: 4.3 PI: 6.3  Alarms: none  Events: 0-5 PI events/day  Fixed speed: 9000  Low speed limit: 8400  Primary Controller: Replace back up battery in 26 months (03/2016) [id number]; version V7.23  Back up controller: Replace back up battery in 18 months (08/2015) [id number]; version V7.23   I reviewed the LVAD parameters from today, and compared the results to the patient's prior recorded data. No changes to LVAD speed. The LVAD is functioning within specified parameters.  The patient performs LVAD self-test daily.  LVAD interrogation was negative for any significant power changes, alarms or PI events/speed drops.  LVAD equipment check completed and is in good  working order.  Back-up equipment present.   LVAD education done on emergency procedures and precautions and reviewed exit site care.    Filed Vitals:   11/16/14 1317  BP: 84/0  Pulse: 80  Height:  (1.626 m)  Weight: 185 lb 3.2 oz (84.006 kg)  SpO2: 98%    Physical Exam: GENERAL: Well appearing, male who presents to clinic today HEENT: normal  NECK: Supple, JVP difficult to assess due to body habitus, .2+ bilaterally, no bruits.  No lymphadenopathy or thyromegaly appreciated.   CARDIAC:  Mechanical heart sounds with LVAD hum present, prominent heart sounds LUNGS:  Clear to auscultation bilaterally.  ABDOMEN:  Soft, round, distended, positive bowel sounds x4.     LVAD exit site: Drive line out 1 cm with expose velour. No erythema or drainage.  Stabilization device present and accurately applied. Dressing being changed weekly. EXTREMITIES:  Warm and dry, no cyanosis, clubbing, rash, R and LLE 3+ Cool extremities.  NEUROLOGIC:  Alert and oriented x 4.  Gait steady.  No aphasia.  No dysarthria.  Affect pleasant.      ASSESSMENT AND PLAN:   1) Chronic systolic HF: NICM, s/p LVAD implant (08/2011) and subsequent (08/2013) NYHAIII - Volume status mildly elevated. Increase lasix to 40 mg daily.   -  Continue hydralazine 50 tid -  Reinforced the need and importance of daily weights, a low sodium diet, and fluid restriction (less than 2 L a day). Instructed to call the HF clinic if weight increases more than 3 lbs overnight or 5 lbs in a week.  2) LVAD, s/p (08/2011) and then (08/2013) d/t pump thrombosis - VAD interrogated personally. Continues with multiple low flow alarms particularly in am while in bed. These are not associated with multiple PI alarms. Suspect this is likely mechanical due to positioning.  - Check LDH, CBC, BMET, pro-BNP and INR today. - Not a tx candidate due to compliance issues.  3) HTN - As above. Controlled on hydralazine 50 tid 4) CVA - Stable.Continues to have left sided weakness. No longer in therapy. Continue atorvastatin 40 mg daily.  6) h/o Atrial Flutter: - now in NSR 7) Anticoagulation management - INR Goal 2.5-3.0.. Will check INR today and adjust accordingly. Long talk about home INR monitoring. 8) RV failure  9) Erectile dysfunction 10) RV pacing - ECG shows NSR with RV pacing at 80. ICD interrogated and back-up rate turned down and he comes through on his own in the 60s. Back-up rate lowered to avoid RV pacing. No VT on ICD.   CLEGG,AMY NP-C  1:11 PM  Patient seen and examined with Tonye Becket, NP. We discussed all aspects of the encounter. I agree with the assessment and plan as stated above.   He is improved after recent hospitalization for diuresis but weight climbing again. Will increase lasix to 40 daily. Reminded him to watch intake. VAD and ICD interrogated personally. Suspect low flow alarms are mechanical in nature. Backup rate on pacer turned down to avoid RV pacing.   Sagal Gayton,MD 5:05 PM

## 2014-11-16 NOTE — Progress Notes (Signed)
Symptom  Yes  No  Details   Angina         x Activity:   Claudication         x How far:    Syncope         x When:    Stroke        x  Jan 2015  Orthopnea         x How many pillows: one  PND               x How often:   CPAP      N/A How many hrs:   Pedal edema               x      Abd fullness               x    N&V         x Good appetite; eating full meal   Diaphoresis         x When:  Bleeding        x   Urine color    light yellow  SOB        x        Activity:  Incline; more fatigue in legs than SOB  Palpitations         x When:  ICD shock         x   Hospitlizaitons         x        When/where/why:  5/17 - 11/10/14  ED visit         x When/where/why:  Other MD         x When/who/why:  Activity           Working 3.5 hours five days week  Fluid           No limitations   Diet     No limitations   Vital signs: HR:  80 Doppler MAP:  84 Auto cuff:  107/56 (88) O2 Sat: 98 Wt: 185.2 lbs   Last wt:  199.8  lbs Ht: 5'4"  LVAD interrogation reveals:  Speed:  9000 Flow:  3.0 Power:  4.3 PI:  6.3 Alarms:  5 low flows 11/12/14 between 6:30 - 8:30 am Events:  0 - 5 PI events/day Fixed speed:  9000 Low speed limit: 8400 Primary Controller:  Replace back up battery in 24 months (03/2016)  [id number]; version V7.23 Back up controller:   Replace back up battery in  24 months (08/2015)  PC [id number]; version V7.23     LVAD Exit Site:  VAD dressing removed and site care performed using sterile technique. Drive line exit site cleaned with Chlora prep applicators x 2, allowed to dry, and Sorbaview dressing with biopatch re-applied.  Approximately 1 - 2 cm of velour remains outside of exit site with old bloody drainage noted.  Exit site without redness, tenderness, drainage, or foul odor. Drive line anchor re-applied. Pt denies fever or chills. Driveline dressing is being changed weekly per patient, provided with adequate dressing supplies for home use.   Pt unsure of medications he  is taking; did not bring meds to clinic.  Will print new med list and ask pt to have caregiver, Irish Lack, to confirm correct med administration at home.     Five Low flow alarms noted on 11/12/14 between 6:30 - 8:30 am; pt verified he was sleeping on his  left side at that time. Having few 0 - 5 PI events daily; denies dizziness, lightheadedness, or syncope.   [employer] rep called to check ICD and decrease VVI pacing rate to 60 per Dr. Gala Romney. Device found to be EOL, Dr. Gala Romney updated.

## 2014-11-16 NOTE — Patient Instructions (Addendum)
1.  Increase furosemide (Lasix) to 40 mg daily (take two 20 mg tabs daily). 2.  Continue K-Dur 40 meq twice daily (take two 20 meq tablets twice daily). 3  Will call you with lab results and coumadin dosing. 4.  Return to VAD clinic in 1 month.

## 2014-11-22 ENCOUNTER — Other Ambulatory Visit: Payer: Medicare Other

## 2014-11-30 ENCOUNTER — Other Ambulatory Visit (INDEPENDENT_AMBULATORY_CARE_PROVIDER_SITE_OTHER): Payer: Medicare Other | Admitting: *Deleted

## 2014-11-30 ENCOUNTER — Ambulatory Visit (HOSPITAL_COMMUNITY): Payer: Self-pay | Admitting: *Deleted

## 2014-11-30 DIAGNOSIS — Z95811 Presence of heart assist device: Secondary | ICD-10-CM

## 2014-11-30 DIAGNOSIS — Z7901 Long term (current) use of anticoagulants: Secondary | ICD-10-CM

## 2014-11-30 LAB — PROTIME-INR
INR: 2.7 ratio — AB (ref 0.8–1.0)
Prothrombin Time: 28.9 s — ABNORMAL HIGH (ref 9.6–13.1)

## 2014-11-30 NOTE — Addendum Note (Signed)
Addended by: Tonita Phoenix on: 11/30/2014 12:55 PM   Modules accepted: Orders

## 2014-12-01 ENCOUNTER — Encounter (HOSPITAL_COMMUNITY): Payer: Self-pay | Admitting: *Deleted

## 2014-12-16 ENCOUNTER — Other Ambulatory Visit (HOSPITAL_COMMUNITY): Payer: Self-pay | Admitting: *Deleted

## 2014-12-16 DIAGNOSIS — Z7901 Long term (current) use of anticoagulants: Secondary | ICD-10-CM

## 2014-12-16 DIAGNOSIS — Z95811 Presence of heart assist device: Secondary | ICD-10-CM

## 2014-12-16 DIAGNOSIS — R0600 Dyspnea, unspecified: Secondary | ICD-10-CM

## 2014-12-19 ENCOUNTER — Encounter (HOSPITAL_COMMUNITY): Payer: Self-pay | Admitting: *Deleted

## 2014-12-19 ENCOUNTER — Ambulatory Visit (HOSPITAL_COMMUNITY): Payer: Self-pay | Admitting: *Deleted

## 2014-12-19 ENCOUNTER — Other Ambulatory Visit (HOSPITAL_COMMUNITY): Payer: Self-pay | Admitting: *Deleted

## 2014-12-19 ENCOUNTER — Ambulatory Visit (HOSPITAL_COMMUNITY)
Admission: RE | Admit: 2014-12-19 | Discharge: 2014-12-19 | Disposition: A | Discharge status: Home / Self Care | Payer: Medicare Other | Source: Ambulatory Visit | Attending: Internal Medicine | Admitting: Internal Medicine

## 2014-12-19 DIAGNOSIS — Z95811 Presence of heart assist device: Secondary | ICD-10-CM | POA: Insufficient documentation

## 2014-12-19 DIAGNOSIS — R0600 Dyspnea, unspecified: Secondary | ICD-10-CM

## 2014-12-19 DIAGNOSIS — Z7901 Long term (current) use of anticoagulants: Secondary | ICD-10-CM

## 2014-12-19 DIAGNOSIS — Z48812 Encounter for surgical aftercare following surgery on the circulatory system: Secondary | ICD-10-CM | POA: Diagnosis not present

## 2014-12-19 LAB — CBC
HCT: 47 % (ref 39.0–52.0)
Hemoglobin: 15.8 g/dL (ref 13.0–17.0)
MCH: 28.4 pg (ref 26.0–34.0)
MCHC: 33.6 g/dL (ref 30.0–36.0)
MCV: 84.4 fL (ref 78.0–100.0)
Platelets: 235 10*3/uL (ref 150–400)
RBC: 5.57 MIL/uL (ref 4.22–5.81)
RDW: 17.6 % — AB (ref 11.5–15.5)
WBC: 3 10*3/uL — AB (ref 4.0–10.5)

## 2014-12-19 LAB — PROTIME-INR
INR: 2.6 — AB (ref 0.00–1.49)
PROTHROMBIN TIME: 27.5 s — AB (ref 11.6–15.2)

## 2014-12-19 NOTE — Patient Instructions (Signed)
1.  Will call you with INR results and coumadin dosing. 2.  Will schedule your next f/u visit in three weeks with ramp echo.

## 2014-12-19 NOTE — Progress Notes (Signed)
Symptom  Yes  No  Details   Angina         x Activity:   Claudication         x How far:    Syncope         x When:    Stroke        x  Jan 2015  Orthopnea         x How many pillows: one  PND               x How often:   CPAP      N/A How many hrs:   Pedal edema               x      Abd fullness               x    N&V         x Good appetite; eating full meal   Diaphoresis         x When:  Bleeding        x   Urine color    light yellow  SOB        x        Activity:  Incline; more fatigue in legs than SOB  Palpitations         x When:  ICD shock         x   Hospitlizaitons                x When/where/why:    ED visit         x When/where/why:  Other MD         x When/who/why:  Activity           Working 7 hours five days week  Fluid           No limitations   Diet     No limitations   Vital signs: HR:  69 Doppler MAP:  106 Auto cuff: 116/80 (102 ) O2 Sat: 98 Wt: 190.4 lbs   Last wt:  185.2  lbs Ht: 5'4"  LVAD interrogation reveals:  Speed:  9000 Flow:  3.5 Power:  4.5 PI:  7.2 Alarms:  LOW FLOW alarms: 12/08/14:  5 12/05/14:  1 12/04/14:  25 12/03/14:  6 Events:  0 - 5 PI events/day Fixed speed:  9000 Low speed limit: 8400 Primary Controller:  Replace back up battery in 23 months (03/2016)  [id number]; version V7.23 Back up controller:   Replace back up battery in  23 months (08/2015)  PC [id number]; version V7.23   LVAD Exit Site:  VAD dressing dry and intact with attachment device accurately applied and driveline secured. Approximately 1 - 2 cm of velour remains outside of exit site with old bloody drainage noted.  Pt denies fever or chills. Driveline dressing is being changed weekly per patient, states he has adequate dressing supplies for home use.   Multiple Low flow alarms noted - all while patient in bed, he thinks he was lying on left side. Having few 0 - 5 PI events daily; denies dizziness, lightheadedness, or syncope.   Caregiver Irish Lack) accompanied pt to  clinic visit; states pt recently moved (updated demographics) and ran out of meds yesterday. State they will get refilled today. Will be moving pharmacy to closer to new home.  Difficult stick today for blood collection, small amount blood obtained for INR and CBC, will  send to lab and see if adequate amount for these two tests.  Dr. Gala Romney aware.   Pt Instructions: 1.  Will call you with INR results and coumadin dosing. 2.  Will schedule your next f/u visit in three weeks with ramp echo.

## 2014-12-23 ENCOUNTER — Telehealth (HOSPITAL_COMMUNITY): Payer: Self-pay | Admitting: *Deleted

## 2014-12-23 NOTE — Telephone Encounter (Signed)
Pts caretaker called in and asked if [facility] faxed "change of status" to [facility]? I told her i would send the message to [facility] and if she didn't hear from [place] today that we would be back in the office Tuesday and would contact her.  Please advise

## 2014-12-26 NOTE — Progress Notes (Signed)
Patient ID: [name], male   DOB: [birth year], 54 y.o.   MRN: [id number]  Primary Physician: Dr. Oliver Barre  Primary Cardiologist: Dr. Gala Romney  HPI: [name] is a 54 yo male with a history of severe CHF, NICM s/p LVAD and TVR (09/2011), VT, NSTEMI, LV thrombus, CKD, GERD, stroke and LVAD hemolysis with LVAD exchange (08/2013).   He was admitted to [facility] January 2015 with NSTEMI and RV failure. He had a difficult course that was complicated by embolic right CVA. He was intubated and eventually had a tracheostomy placed 07/28/13. He was transferred to inpatient rehab for extensive rehab and was making progression, however despite aggressive therapy with heparin, coumadin, ASA and Plavix, his LDH remained persistently elevated in the 950 range (was about 1750 on admit). He did not experience any pump dysfunction, however the case was discussed with Dr Allena Katz and [name] at [facility] and it was felt to transfer him down there for possible pump exchange. He underwent a pump exchange 08/31/13. Post op was complicated by bleeding and he received multiple transfusions. He had AKI and had intermittent HD.  Admitted 5/13-5/18/15 for volume overload and possible hemolysis. LDH 647 and INR 1.69 on admission. Treated with IV lasix and IV heparin. While in the hospital developed some PI events and speed was decreased to 9400. ICD interrogated and showed A flutter and he was placed on IV amiodarone and then transitioned to 200 mg PO BID. He cardioverted to NSR on 5/15. LDH trended down and on discharge was 530 and INR 2.4.  In January 2016 admitted with multiple low flow alarms. Diuretics held and given IVF with improvement. Echo showed severe RV dysfunction  Admitted May 17,2016 from HF.LVAD clinic with volume overload. Diuresed with IV lasix.Multiple low flow alarms particularly in the am felt to be positional. Discharge weight was 175 pounds.    Post Hospital Follow up for Heart Failure and LVAD: Overall feeling well. Asking  about his candidacy to get back on transplant list. Taking lasix daily. Continues to work at [employer] now doing 7 hours per day. Says he has had multiple low alarms mostly in am. Denies SOB/PND/edema.  No dizziness or presyncope. Not weighing daily.    Denies driveline trauma, erythema or drainage.  Denies ICD shocks.   Reports taking Coumadin as prescribed and adherence to anticoagulation based dietary restrictions.  Denies bright red blood per rectum or melena, no dark urine or hematuria.    SH: Disabled, lives at home with caregiver. No ETOH or smoking FH: Mother living; HTN        Father living; no health issues    Past Medical History  Diagnosis Date  . CHF (congestive heart failure)     EF- 10-15  . Medically noncompliant   . Mitral regurgitation   . Tobacco user   . HTN (hypertension)   . AICD (automatic cardioverter/defibrillator) present   . GERD (gastroesophageal reflux disease)   . Substance abuse   . Chronic renal insufficiency   . Syncope   . Thrombus 08/06/2010  . SYSTOLIC HEART FAILURE, CHRONIC 09/22/2008    Qualifier: Diagnosis of  By: Gala Romney, MD, Trixie Dredge   . LV (left ventricular) mural thrombus 01/28/2011  . ICD - IN SITU 09/16/2008    Qualifier: Diagnosis of  By: Wonda Amis    . MITRAL STENOSIS/ INSUFFICIENCY, NON-RHEUMATIC 09/22/2008    Qualifier: Diagnosis of  By: Gala Romney, MD, Trixie Dredge Hepatomegaly 09/16/2008    Qualifier: Diagnosis  of  By: Wonda Amis    . High cholesterol 02/26/2012    "at one time"  . Sleep apnea   . Exertional dyspnea 02/26/2012  . History of blood transfusion 08/2011    "when I had heart pump"  . Migraines   . COMMON MIGRAINE 06/14/2009    Qualifier: Diagnosis of  By: Jonny Ruiz MD, Len Blalock   . History of gout 02/26/2012  . Depression 08/11/2013    Pt denies  . Bipolar affective disorder 10/22/2011    pt denies this hx 02/26/2012    Current Outpatient Prescriptions  Medication Sig Dispense Refill  .  aspirin 325 MG EC tablet Take 1 tablet (325 mg total) by mouth daily. 30 tablet 6  . atorvastatin (LIPITOR) 40 MG tablet Take 1 tablet (40 mg total) by mouth daily. 30 tablet 3  . carvedilol (COREG) 3.125 MG tablet Take 1 tablet (3.125 mg total) by mouth 2 (two) times daily with a meal. 60 tablet 6  . clopidogrel (PLAVIX) 75 MG tablet Take 1 tablet (75 mg total) by mouth daily with breakfast. 30 tablet 6  . furosemide (LASIX) 20 MG tablet Take 2 tablets (40 mg total) by mouth daily. Or as directed 60 tablet 6  . hydrALAZINE (APRESOLINE) 50 MG tablet Take 1 tablet (50 mg total) by mouth 3 (three) times daily. 270 tablet 3  . losartan (COZAAR) 100 MG tablet Take 1 tablet (100 mg total) by mouth daily. 30 tablet 6  . potassium chloride SA (K-DUR,KLOR-CON) 20 MEQ tablet Take 2 tablets (40 mEq total) by mouth 2 (two) times daily. 60 tablet 6  . tamsulosin (FLOMAX) 0.4 MG CAPS capsule Take 1 capsule (0.4 mg total) by mouth daily. 30 capsule 6  . tetrahydrozoline-zinc (VISINE-AC) 0.05-0.25 % ophthalmic solution Place 2 drops into both eyes daily as needed (red eyes).    . warfarin (COUMADIN) 4 MG tablet Take 1 tablet (4 mg total) by mouth daily. Hold lasix 1/13 and 1/14 then start 4 mg daily on 07/08/14 (Patient taking differently: Take 4 mg by mouth daily at 6 PM. ) 45 tablet 6   No current facility-administered medications for this encounter.   Facility-Administered Medications Ordered in Other Encounters  Medication Dose Route Frequency Provider Last Rate Last Dose  . sodium chloride 0.9 % injection 10-40 mL  10-40 mL Intracatheter PRN [name], MD        Ace inhibitors and Lexapro  REVIEW OF SYSTEMS: All systems negative except as listed in HPI, PMH and Problem list.    LVAD interrogation reveals:  Speed: 9000  Flow: 3.5  Power: 4.5  PI: 7.2  Alarms: LOW FLOW alarms:  12/08/14: 5  12/05/14: 1  12/04/14: 25  12/03/14: 6  Events: 0 - 5 PI events/day  Fixed speed: 9000  Low speed  limit: 8400   I reviewed the LVAD parameters from today, and compared the results to the patient's prior recorded data. No changes to LVAD speed. The LVAD is functioning within specified parameters.  The patient performs LVAD self-test daily.  LVAD interrogation was negative for any significant power changes, alarms or PI events/speed drops.  LVAD equipment check completed and is in good working order.  Back-up equipment present.   LVAD education done on emergency procedures and precautions and reviewed exit site care.    Filed Vitals:   12/19/14 1325 12/19/14 1329  BP: 106/0 132/98  Pulse:  69  Height:  5\' 4"  (1.626 m)  Weight:  190  lb 6.4 oz (86.365 kg)  SpO2:  96%    Vital signs:  HR: 69  Doppler MAP: 106  Auto cuff: 116/80 (102 )  O2 Sat: 98  Wt: 190.4 lbs  Last wt: 185.2 lbs  Ht: 5'4"  Physical Exam: GENERAL: Well appearing, male who presents to clinic today HEENT: normal  NECK: Supple, JVP difficult to assess due to body habitus, .2+ bilaterally, no bruits.  No lymphadenopathy or thyromegaly appreciated.   CARDIAC:  Mechanical heart sounds with LVAD hum present, prominent heart sounds LUNGS:  Clear to auscultation bilaterally.  ABDOMEN:  Soft, round, distended, positive bowel sounds x4.     LVAD exit site: Drive line out 1 cm with expose velour. No erythema or drainage.  Stabilization device present and accurately applied. Dressing being changed weekly. EXTREMITIES:  Warm and dry, no cyanosis, clubbing, rash, trace edema L> R  NEUROLOGIC:  Alert and oriented x 4.  L-sided weakness. Gait steady.  No aphasia.  Affect pleasant.      ASSESSMENT AND PLAN:   1) Chronic systolic HF: NICM, s/p LVAD implant (08/2011) and subsequent (08/2013) - Stable NYHAII-III - Volume status looks good. Continue current regimen.  -  Reinforced the need and importance of daily weights, a low sodium diet, and fluid restriction (less than 2 L a day). Instructed to call the HF clinic if weight  increases more than 3 lbs overnight or 5 lbs in a week.  2) LVAD, s/p (08/2011) and then (08/2013) d/t pump thrombosis - VAD interrogated personally. Continues with multiple low flow alarms particularly in am while in bed. These are not associated with multiple PI alarms. Suspect this is likely mechanical due to positioning.  - Check LDH, CBC, BMET, pro-BNP and INR today. - Not a tx candidate due to compliance issues.  3) HTN - Stable. Controlled on hydralazine 50 tid 4) CVA - Stable.Continues to have left sided weakness. No longer in therapy. Continue atorvastatin 40 mg daily.  6) h/o Atrial Flutter: - now in NSR 7) Anticoagulation management - INR Goal 2.5-3.0.. Will check INR today and adjust accordingly. Long talk about home INR monitoring. 8) RV failure -stable  Bensimhon, Daniel MD  1:51 PM

## 2014-12-29 ENCOUNTER — Other Ambulatory Visit (HOSPITAL_COMMUNITY): Payer: Self-pay | Admitting: *Deleted

## 2014-12-29 DIAGNOSIS — I10 Essential (primary) hypertension: Secondary | ICD-10-CM

## 2014-12-29 DIAGNOSIS — I5023 Acute on chronic systolic (congestive) heart failure: Secondary | ICD-10-CM

## 2014-12-29 DIAGNOSIS — I5022 Chronic systolic (congestive) heart failure: Secondary | ICD-10-CM

## 2014-12-29 DIAGNOSIS — I471 Supraventricular tachycardia: Secondary | ICD-10-CM

## 2014-12-29 DIAGNOSIS — Z95811 Presence of heart assist device: Secondary | ICD-10-CM

## 2014-12-29 DIAGNOSIS — I6312 Cerebral infarction due to embolism of basilar artery: Secondary | ICD-10-CM

## 2014-12-29 MED ORDER — LOSARTAN POTASSIUM 100 MG PO TABS: 100.0000 mg | ORAL_TABLET | Freq: Every day | ORAL | Status: DC

## 2014-12-29 MED ORDER — ATORVASTATIN CALCIUM 40 MG PO TABS: 40.0000 mg | ORAL_TABLET | Freq: Every day | ORAL | Status: DC

## 2014-12-29 MED ORDER — WARFARIN SODIUM 4 MG PO TABS: 4.0000 mg | ORAL_TABLET | Freq: Every day | ORAL | Status: DC

## 2014-12-29 MED ORDER — CARVEDILOL 3.125 MG PO TABS: 3.1250 mg | ORAL_TABLET | Freq: Two times a day (BID) | ORAL | Status: DC

## 2014-12-29 MED ORDER — FUROSEMIDE 20 MG PO TABS: 40.0000 mg | ORAL_TABLET | Freq: Every day | ORAL | Status: DC

## 2014-12-29 MED ORDER — HYDRALAZINE HCL 50 MG PO TABS: 50.0000 mg | ORAL_TABLET | Freq: Three times a day (TID) | ORAL | Status: DC

## 2014-12-29 MED ORDER — CLOPIDOGREL BISULFATE 75 MG PO TABS: 75.0000 mg | ORAL_TABLET | Freq: Every day | ORAL | Status: DC

## 2014-12-29 MED ORDER — POTASSIUM CHLORIDE CRYS ER 20 MEQ PO TBCR: 40.0000 meq | EXTENDED_RELEASE_TABLET | Freq: Two times a day (BID) | ORAL | Status: DC

## 2014-12-29 MED ORDER — TAMSULOSIN HCL 0.4 MG PO CAPS: 0.4000 mg | ORAL_CAPSULE | Freq: Every day | ORAL | Status: DC

## 2015-01-02 ENCOUNTER — Encounter (HOSPITAL_COMMUNITY): Payer: Self-pay | Admitting: Infectious Diseases

## 2015-01-02 ENCOUNTER — Encounter: Payer: Self-pay | Admitting: Infectious Diseases

## 2015-01-02 ENCOUNTER — Other Ambulatory Visit (INDEPENDENT_AMBULATORY_CARE_PROVIDER_SITE_OTHER): Payer: Medicare Other | Admitting: *Deleted

## 2015-01-02 ENCOUNTER — Ambulatory Visit (HOSPITAL_COMMUNITY): Payer: Self-pay | Admitting: Infectious Diseases

## 2015-01-02 DIAGNOSIS — Z7901 Long term (current) use of anticoagulants: Secondary | ICD-10-CM | POA: Diagnosis not present

## 2015-01-02 DIAGNOSIS — Z95811 Presence of heart assist device: Secondary | ICD-10-CM | POA: Diagnosis not present

## 2015-01-02 LAB — PROTIME-INR
INR: 2.4 ratio — ABNORMAL HIGH (ref 0.8–1.0)
PROTHROMBIN TIME: 26.3 s — AB (ref 9.6–13.1)

## 2015-01-02 NOTE — Addendum Note (Signed)
Addended by: Tonita Phoenix on: 01/02/2015 12:53 PM   Modules accepted: Orders

## 2015-01-02 NOTE — Progress Notes (Signed)
Pt is scheduled for RAMP echo (Limited) on Tuesday 01/17/15 by Dr. Gala Romney.   CPT Code: 63785 ICD 10 Code: Z01.818  Insurance: Medicaid With pts current insurance- [employer].   Orders were entered at last clinic visit.

## 2015-01-14 ENCOUNTER — Other Ambulatory Visit (HOSPITAL_COMMUNITY): Payer: Self-pay | Admitting: Anesthesiology

## 2015-01-17 ENCOUNTER — Other Ambulatory Visit (HOSPITAL_COMMUNITY): Payer: Self-pay | Admitting: Internal Medicine

## 2015-01-17 ENCOUNTER — Ambulatory Visit (HOSPITAL_COMMUNITY)
Admission: RE | Admit: 2015-01-17 | Discharge: 2015-01-17 | Disposition: A | Discharge status: Home / Self Care | Payer: Medicare Other | Source: Ambulatory Visit | Attending: Internal Medicine | Admitting: Internal Medicine

## 2015-01-17 ENCOUNTER — Other Ambulatory Visit (HOSPITAL_COMMUNITY): Payer: Self-pay | Admitting: Infectious Diseases

## 2015-01-17 ENCOUNTER — Other Ambulatory Visit (HOSPITAL_COMMUNITY): Payer: Self-pay | Admitting: *Deleted

## 2015-01-17 ENCOUNTER — Ambulatory Visit (HOSPITAL_BASED_OUTPATIENT_CLINIC_OR_DEPARTMENT_OTHER)
Admission: RE | Admit: 2015-01-17 | Discharge: 2015-01-17 | Disposition: A | Discharge status: Home / Self Care | Payer: Medicare Other | Source: Ambulatory Visit | Attending: Internal Medicine | Admitting: Internal Medicine

## 2015-01-17 ENCOUNTER — Encounter (HOSPITAL_COMMUNITY): Payer: Self-pay | Admitting: *Deleted

## 2015-01-17 ENCOUNTER — Ambulatory Visit (HOSPITAL_COMMUNITY): Payer: Self-pay | Admitting: *Deleted

## 2015-01-17 VITALS — BP 110/89 | HR 88 | Ht 64.0 in | Wt 194.0 lb

## 2015-01-17 DIAGNOSIS — Z7901 Long term (current) use of anticoagulants: Secondary | ICD-10-CM | POA: Insufficient documentation

## 2015-01-17 DIAGNOSIS — T829XXA Unspecified complication of cardiac and vascular prosthetic device, implant and graft, initial encounter: Secondary | ICD-10-CM

## 2015-01-17 DIAGNOSIS — Z95811 Presence of heart assist device: Secondary | ICD-10-CM

## 2015-01-17 DIAGNOSIS — I959 Hypotension, unspecified: Secondary | ICD-10-CM | POA: Insufficient documentation

## 2015-01-17 DIAGNOSIS — I5022 Chronic systolic (congestive) heart failure: Secondary | ICD-10-CM

## 2015-01-17 DIAGNOSIS — I509 Heart failure, unspecified: Secondary | ICD-10-CM | POA: Diagnosis present

## 2015-01-17 DIAGNOSIS — I5041 Acute combined systolic (congestive) and diastolic (congestive) heart failure: Secondary | ICD-10-CM

## 2015-01-17 DIAGNOSIS — R0602 Shortness of breath: Secondary | ICD-10-CM

## 2015-01-17 DIAGNOSIS — I50810 Right heart failure, unspecified: Secondary | ICD-10-CM

## 2015-01-17 DIAGNOSIS — Z720 Tobacco use: Secondary | ICD-10-CM | POA: Insufficient documentation

## 2015-01-17 DIAGNOSIS — R0600 Dyspnea, unspecified: Secondary | ICD-10-CM | POA: Diagnosis not present

## 2015-01-17 LAB — BASIC METABOLIC PANEL
Anion gap: 10 (ref 5–15)
BUN: 13 mg/dL (ref 6–20)
CALCIUM: 9.3 mg/dL (ref 8.9–10.3)
CO2: 24 mmol/L (ref 22–32)
Chloride: 103 mmol/L (ref 101–111)
Creatinine, Ser: 1.14 mg/dL (ref 0.61–1.24)
GFR calc Af Amer: >60 mL/min (ref >60)
GFR calc non Af Amer: >60 mL/min (ref >60)
Glucose, Bld: 86 mg/dL (ref 65–99)
Potassium: 3.7 mmol/L (ref 3.5–5.1)
Sodium: 137 mmol/L (ref 135–145)

## 2015-01-17 LAB — BRAIN NATRIURETIC PEPTIDE: B Natriuretic Peptide: 272.1 pg/mL — ABNORMAL HIGH (ref 0.0–100.0)

## 2015-01-17 LAB — PROTIME-INR
INR: 1.99 — AB (ref 0.00–1.49)
PROTHROMBIN TIME: 22.5 s — AB (ref 11.6–15.2)

## 2015-01-17 LAB — CBC
HCT: 44.6 % (ref 39.0–52.0)
Hemoglobin: 14.8 g/dL (ref 13.0–17.0)
MCH: 28.9 pg (ref 26.0–34.0)
MCHC: 33.2 g/dL (ref 30.0–36.0)
MCV: 87.1 fL (ref 78.0–100.0)
PLATELETS: 223 10*3/uL (ref 150–400)
RBC: 5.12 MIL/uL (ref 4.22–5.81)
RDW: 16.9 % — ABNORMAL HIGH (ref 11.5–15.5)
WBC: 2.6 10*3/uL — AB (ref 4.0–10.5)

## 2015-01-17 LAB — LACTATE DEHYDROGENASE: LDH: 351 U/L — AB (ref 98–192)

## 2015-01-17 NOTE — Progress Notes (Signed)
  Echocardiogram 2D Echocardiogram has been performed.  Arvil Chaco 01/17/2015, 3:09 PM

## 2015-01-17 NOTE — Patient Instructions (Addendum)
1. Take 80 mg (4 pills) of lasix today, Wednesday and Thursday then resume your normal 40 mg (2 pills a day).   2. We need to schedule you for a chest CT scan hopefully this next week. We will call you to confirm appointment. Please bring your back up controller with you at this appointment and come to heart failure clinic so we can adjust the speed to match what we have set your pump at today.   3. Your INR is a little low today at 1.99 (goal 2.5 - 3). Please take 8 mg (2 pills) today. Next INR will be next week with your CT scan.

## 2015-01-17 NOTE — Progress Notes (Signed)
Symptom  Yes  No  Details   Angina         x Activity:   Claudication         x How far:    Syncope         x When:    Stroke        x  Jan 2015  Orthopnea         x How many pillows: one  PND               x How often:   CPAP      N/A How many hrs:   Pedal edema               x      Abd fullness               x    N&V         x Good appetite; eating full meal   Diaphoresis         x When:  Bleeding        x   Urine color    light yellow  SOB        x        Activity:  Incline; more fatigue in legs than SOB  Palpitations         x When:  ICD shock         x   Hospitlizaitons                x When/where/why:    ED visit         x When/where/why:  Other MD         x When/who/why:  Activity           Working 7 hours five days week  Fluid           No limitations   Diet     No limitations   Vital signs: HR:  88 Doppler MAP:  100 Auto cuff: 110/89 (95) O2 Sat: 96 Wt: 194 lbs   Last wt:  190  lbs Ht: 5'4"  LVAD interrogation reveals:  Speed:  9000 Flow:  4.2 Power:  4.7 PI:  7.5 Alarms:  LOW FLOW alarms: 01/13/15:  1 01/08/15:  1 01/07/15:  5 01/01/15:  12 12/31/14:    7 Events:  3 - 5 PI events/day Fixed speed:  9000 Low speed limit: 8400 Primary Controller:  Replace back up battery in 22 months (03/2016)  [id number]; version V7.23 Back up controller:   Replace back up battery in  22 months (08/2015)  [id number]; version V7.23   LVAD Exit Site:  VAD dressing dry and intact with attachment device accurately applied and driveline secured. Approximately 1 - 2 cm of velour remains outside of exit site with old bloody drainage noted.  Pt denies fever or chills. Driveline dressing is being changed weekly per patient, provided with adequate dressing supplies for home use.   Multiple Low flow alarms noted - all while patient in bed, he thinks he was lying on left side. Having few 0 - 5 PI events daily; denies dizziness, lightheadedness, or syncope.   Spoke with [place] via  telephone to confirm patient's meds, she reported that patient ran out of ASA 325 mg 2 weeks ago   State they will get today.    Speed  Flow  PI  Power  LVIDD  AI  Aortic openings  MR  TR  Septum  RV   9000 3.6 7.1 4.5 4.79 mod 0/5 mod  trivial Bowing to left  SeverelyHK    8800 3.6 7.5 4.3 4.5 mod  0/5 mod trivial Bowing to left     8600 3.6 6.7 4.2 4.8 mod 0/5 mod trivial Slight left     Doppler MAP: 100    Ramp ECHO performed in echo lab per Dr. Gala Romney.   At completion of ramp study, patients primary controller programmed:  Fixed speed: 8600 Low speed limit: 8000  Patient does not have back up controller with him today. Asked him to please bring controller when he gets CT scan done in near future so we can change speed to match.    Pt Instructions: 1. Take 80 mg (4 pills) of lasix today, Wednesday and Thursday then resume your normal 40 mg (2 pills a day).   2. We need to schedule you for a chest CT scan hopefully next week. We will call you to confirm appointment. Please bring your back up controller with you at this appointment and come to heart failure clinic so we can adjust the speed to match what we have set your pump at today.   3. Your INR is a little low today at 1.99 (goal 2.5 - 3). Please take 8 mg (2 pills) today. Next INR will be next week with your CT scan.

## 2015-01-18 NOTE — Progress Notes (Signed)
Patient ID: [name], male   DOB: 08/13/1960, 54 y.o.   MRN: [id number]  Primary Physician: Dr. Oliver Barre  Primary Cardiologist: Dr. Gala Romney  HPI: [name] is a 54 yo male with a history of severe CHF, NICM s/p LVAD and TVR (09/2011), VT, NSTEMI, LV thrombus, CKD, GERD, stroke and LVAD hemolysis with LVAD exchange (08/2013).   He was admitted to [facility] January 2015 with NSTEMI and RV failure. He had a difficult course that was complicated by embolic right CVA. He was intubated and eventually had a tracheostomy placed 07/28/13. He was transferred to inpatient rehab for extensive rehab and was making progression, however despite aggressive therapy with heparin, coumadin, ASA and Plavix, his LDH remained persistently elevated in the 950 range (was about 1750 on admit). He did not experience any pump dysfunction, however the case was discussed with Dr Allena Katz and [name] at [facility] and it was felt to transfer him down there for possible pump exchange. He underwent a pump exchange 08/31/13. Post op was complicated by bleeding and he received multiple transfusions. He had AKI and had intermittent HD.  Admitted 5/13-5/18/15 for volume overload and possible hemolysis. LDH 647 and INR 1.69 on admission. Treated with IV lasix and IV heparin. While in the hospital developed some PI events and speed was decreased to 9400. ICD interrogated and showed A flutter and he was placed on IV amiodarone and then transitioned to 200 mg PO BID. He cardioverted to NSR on 5/15. LDH trended down and on discharge was 530 and INR 2.4.  In January 2016 admitted with multiple low flow alarms. Diuretics held and given IVF with improvement. Echo showed severe RV dysfunction  Admitted May 17,2016 from HF.LVAD clinic with volume overload. Diuresed with IV lasix.Multiple low flow alarms particularly in the am felt to be positional. Discharge weight was 175 pounds.     Follow up for Heart Failure and LVAD: Presents today for ramp study due to  persistent low flow alarms mostly in am when in bed. Overall feeling well. Continues to work at [employer] now doing 7 hours per day. Says he is taking all meds but is having some LE swelling and ab bloating. Not watching his diet. Denies SOB/PND/edema.  No dizziness or presyncope. Not weighing daily.    Denies driveline trauma, erythema or drainage.  Denies ICD shocks.   Reports taking Coumadin as prescribed and adherence to anticoagulation based dietary restrictions.  Denies bright red blood per rectum or melena, no dark urine or hematuria.    SH: Disabled, lives at home with caregiver. No ETOH or smoking FH: Mother living; HTN        Father living; no health issues    Past Medical History  Diagnosis Date  . CHF (congestive heart failure)     EF- 10-15  . Medically noncompliant   . Mitral regurgitation   . Tobacco user   . HTN (hypertension)   . AICD (automatic cardioverter/defibrillator) present   . GERD (gastroesophageal reflux disease)   . Substance abuse   . Chronic renal insufficiency   . Syncope   . Thrombus 08/06/2010  . SYSTOLIC HEART FAILURE, CHRONIC 09/22/2008    Qualifier: Diagnosis of  By: Gala Romney, MD, Trixie Dredge   . LV (left ventricular) mural thrombus 01/28/2011  . ICD - IN SITU 09/16/2008    Qualifier: Diagnosis of  By: Wonda Amis    . MITRAL STENOSIS/ INSUFFICIENCY, NON-RHEUMATIC 09/22/2008    Qualifier: Diagnosis of  By: Gala Romney, MD, [facility],  Bevelyn Buckles   . Hepatomegaly 09/16/2008    Qualifier: Diagnosis of  By: Wonda Amis    . High cholesterol 02/26/2012    "at one time"  . Sleep apnea   . Exertional dyspnea 02/26/2012  . History of blood transfusion 08/2011    "when I had heart pump"  . Migraines   . COMMON MIGRAINE 06/14/2009    Qualifier: Diagnosis of  By: Jonny Ruiz MD, Len Blalock   . History of gout 02/26/2012  . Depression 08/11/2013    Pt denies  . Bipolar affective disorder 10/22/2011    pt denies this hx 02/26/2012    Current Outpatient  Prescriptions  Medication Sig Dispense Refill  . atorvastatin (LIPITOR) 40 MG tablet Take 1 tablet (40 mg total) by mouth daily. 30 tablet 6  . carvedilol (COREG) 3.125 MG tablet Take 1 tablet (3.125 mg total) by mouth 2 (two) times daily with a meal. 60 tablet 6  . clopidogrel (PLAVIX) 75 MG tablet Take 1 tablet (75 mg total) by mouth daily with breakfast. 30 tablet 6  . furosemide (LASIX) 20 MG tablet Take 2 tablets (40 mg total) by mouth daily. Or as directed 60 tablet 6  . hydrALAZINE (APRESOLINE) 50 MG tablet Take 1 tablet (50 mg total) by mouth 3 (three) times daily. 270 tablet 3  . losartan (COZAAR) 100 MG tablet Take 1 tablet (100 mg total) by mouth daily. 30 tablet 6  . potassium chloride SA (K-DUR,KLOR-CON) 20 MEQ tablet Take 2 tablets (40 mEq total) by mouth 2 (two) times daily. 60 tablet 6  . tamsulosin (FLOMAX) 0.4 MG CAPS capsule Take 1 capsule (0.4 mg total) by mouth daily. 30 capsule 6  . tetrahydrozoline-zinc (VISINE-AC) 0.05-0.25 % ophthalmic solution Place 2 drops into both eyes daily as needed (red eyes).    . warfarin (COUMADIN) 4 MG tablet TAKE ONE TABLETONCE DAILY OR AS INSTRUCTED FOR INR 2.0 - 3.0 60 tablet 5  . aspirin 325 MG EC tablet Take 1 tablet (325 mg total) by mouth daily. (Patient not taking: Reported on 01/17/2015) 30 tablet 6   No current facility-administered medications for this encounter.   Facility-Administered Medications Ordered in Other Encounters  Medication Dose Route Frequency Provider Last Rate Last Dose  . sodium chloride 0.9 % injection 10-40 mL  10-40 mL Intracatheter PRN [name], MD        Ace inhibitors and Lexapro  REVIEW OF SYSTEMS: All systems negative except as listed in HPI, PMH and Problem list.    LVAD interrogation reveals:  Speed: 9000 Flow: 4.2 Power: 4.7 PI: 7.5 Alarms: LOW FLOW alarms: 01/13/15: 1 01/08/15: 1 01/07/15: 5 01/01/15: 12 12/31/14: 7 Events: 3 - 5 PI events/day Fixed speed: 9000 Low speed  limit: 8400 Primary Controller: Replace back up battery in 22 months (03/2016) [id number]; version V7.23 Back up controller: Replace back up battery in 22 months (08/2015) [id number]; version V7.23   I reviewed the LVAD parameters from today, and compared the results to the patient's prior recorded data. No changes to LVAD speed. The LVAD is functioning within specified parameters.  The patient performs LVAD self-test daily.  LVAD interrogation was negative for any significant power changes, alarms or PI events/speed drops.  LVAD equipment check completed and is in good working order.  Back-up equipment present.   LVAD education done on emergency procedures and precautions and reviewed exit site care.    Filed Vitals:   01/17/15 1300 01/17/15 1301  BP: 100/0 110/89  Pulse: 88   Height: 5\' 4"  (1.626 m)   Weight: 194 lb (87.998 kg)   SpO2: 96%     Vital signs:  HR: 69  Doppler MAP: 106  Auto cuff: 116/80 (102 )  O2 Sat: 98  Wt: 190.4 lbs  Last wt: 185.2 lbs  Ht: 5'4"  Physical Exam: GENERAL: Well appearing, male who presents to clinic today HEENT: normal  NECK: Supple, JVP up No lymphadenopathy or thyromegaly appreciated.   CARDIAC:  Mechanical heart sounds with LVAD hum present, prominent heart sounds LUNGS:  Clear to auscultation bilaterally.  ABDOMEN:  Soft, round, distended, positive bowel sounds x4.     LVAD exit site: Drive line out 1 cm with expose velour. No erythema or drainage.  Stabilization device present and accurately applied. Dressing being changed weekly. EXTREMITIES:  Warm and dry, no cyanosis, clubbing, rash, 1-2+ edema L> R  NEUROLOGIC:  Alert and oriented x 4.  L-sided weakness. Gait steady.  No aphasia.  Affect pleasant.      ASSESSMENT AND PLAN:   1) Chronic systolic HF: NICM, s/p LVAD implant (08/2011) and subsequent (08/2013) - Stable NYHA II-III - Volume status up. Will double lasix for 3 days.  -  Reinforced the need and importance of daily  weights, a low sodium diet, and fluid restriction (less than 2 L a day). Instructed to call the HF clinic if weight increases more than 3 lbs overnight or 5 lbs in a week.  2) LVAD, s/p (08/2011) and then (08/2013) d/t pump thrombosis - VAD interrogated personally. Continues with multiple low flow alarms particularly in am while in bed. These are not associated with multiple PI alarms. Suspect this is likely mechanical due to positioning. Will get CT chest just to be sure no kink in outflow graft. D/w Dr. Allena Katz at [facility] who agrees. VAD speed turned down to 8600 on Ramp echo today. - Check LDH, CBC, BMET, pro-BNP and INR today. - Not a tx candidate due to compliance issues.  3) HTN - Elevated today. Hopefully will improve with diuresis. 4) CVA - Stable.Continues to have left sided weakness. No longer in therapy. Continue atorvastatin 40 mg daily.  6) h/o Atrial Flutter: - now in NSR 7) Anticoagulation management - INR Goal 2.5-3.0.. Will check INR today and adjust accordingly. Long talk about home INR monitoring. 8) RV failure -stable  Terri Malerba MD  11:15 PM

## 2015-01-24 ENCOUNTER — Ambulatory Visit (HOSPITAL_COMMUNITY): Payer: Self-pay | Admitting: *Deleted

## 2015-01-24 ENCOUNTER — Encounter (HOSPITAL_COMMUNITY): Payer: Self-pay | Admitting: *Deleted

## 2015-01-24 ENCOUNTER — Ambulatory Visit (HOSPITAL_COMMUNITY)
Admission: RE | Admit: 2015-01-24 | Discharge: 2015-01-24 | Disposition: A | Discharge status: Home / Self Care | Payer: Medicare Other | Source: Ambulatory Visit | Attending: Internal Medicine | Admitting: Internal Medicine

## 2015-01-24 DIAGNOSIS — R911 Solitary pulmonary nodule: Secondary | ICD-10-CM | POA: Insufficient documentation

## 2015-01-24 DIAGNOSIS — R0602 Shortness of breath: Secondary | ICD-10-CM | POA: Diagnosis not present

## 2015-01-24 DIAGNOSIS — I5041 Acute combined systolic (congestive) and diastolic (congestive) heart failure: Secondary | ICD-10-CM | POA: Insufficient documentation

## 2015-01-24 DIAGNOSIS — Z7901 Long term (current) use of anticoagulants: Secondary | ICD-10-CM

## 2015-01-24 DIAGNOSIS — Z95811 Presence of heart assist device: Secondary | ICD-10-CM

## 2015-01-24 DIAGNOSIS — I50810 Right heart failure, unspecified: Secondary | ICD-10-CM

## 2015-01-24 LAB — PROTIME-INR
INR: 2.81 — ABNORMAL HIGH (ref 0.00–1.49)
PROTHROMBIN TIME: 29.1 s — AB (ref 11.6–15.2)

## 2015-01-24 MED ORDER — IOHEXOL 350 MG/ML SOLN
80.0000 mL | Freq: Once | INTRAVENOUS | Status: AC | PRN
  Administered 2015-01-24: 80 mL via INTRAVENOUS

## 2015-01-26 IMAGING — CR DG CHEST 1V PORT
1 series · 1 of 1 positions shown · non-contrast
Comparison: 08/19/2013

CLINICAL DATA: Chest pain shortness of Breath

EXAM:
PORTABLE CHEST - 1 VIEW

[AP]
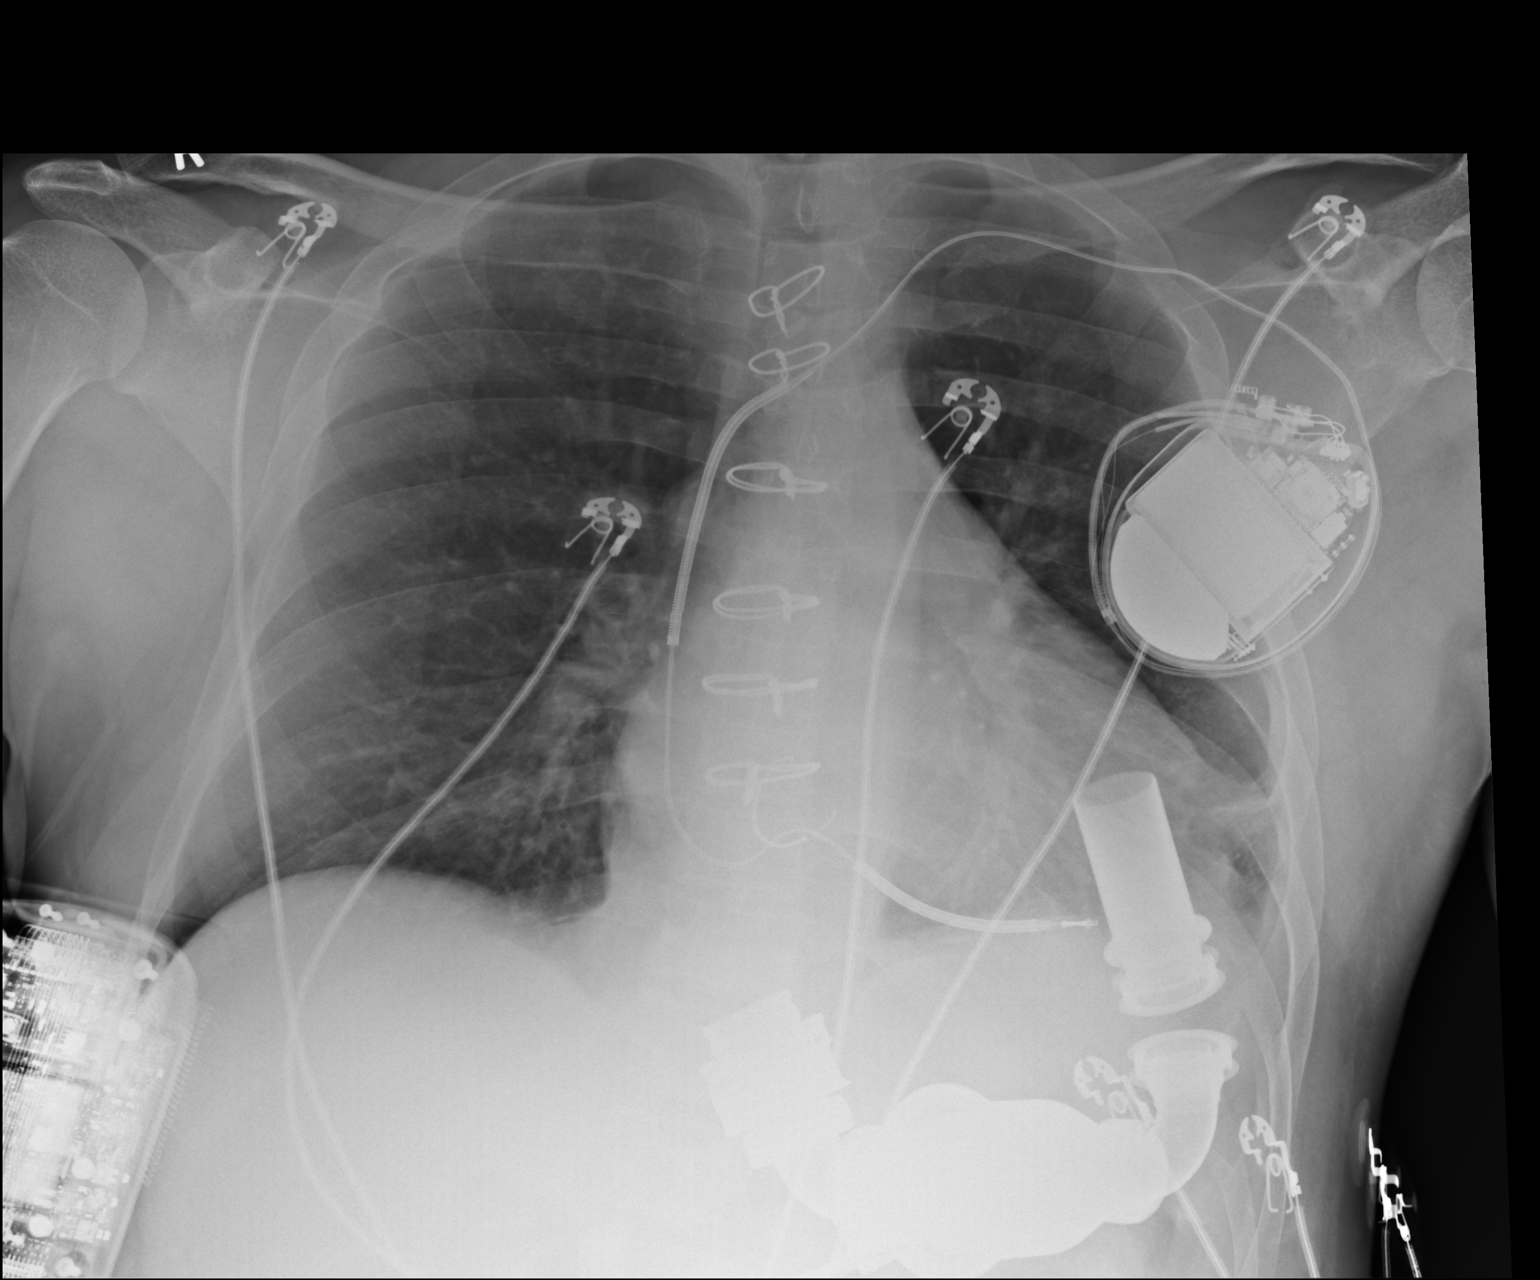

[1 of 1 positions shown; findings below may reference images not displayed]

FINDINGS: A defibrillator is again noted as is a left ventricular assist
device. The cardiac shadow remains mildly enlarged. Postsurgical
changes are seen. The lungs are well aerated with the exception of
some minimal scarring in the left base. No bony abnormality is
noted.
IMPRESSION: No acute abnormality seen.

## 2015-02-08 ENCOUNTER — Other Ambulatory Visit: Payer: Medicare Other

## 2015-02-13 ENCOUNTER — Telehealth (HOSPITAL_COMMUNITY): Payer: Self-pay | Admitting: *Deleted

## 2015-02-13 DIAGNOSIS — Z95811 Presence of heart assist device: Secondary | ICD-10-CM

## 2015-02-13 DIAGNOSIS — Z7901 Long term (current) use of anticoagulants: Secondary | ICD-10-CM

## 2015-02-13 NOTE — Telephone Encounter (Signed)
Attempted to call pt re: missed INR appt last week, no answer and unable to leave message. Redmond Baseman, she will ask pt to get INR checked tomorrow at [facility] lab.

## 2015-02-14 ENCOUNTER — Other Ambulatory Visit: Payer: Medicare Other

## 2015-02-22 ENCOUNTER — Other Ambulatory Visit: Payer: Medicare Other

## 2015-02-28 ENCOUNTER — Other Ambulatory Visit (INDEPENDENT_AMBULATORY_CARE_PROVIDER_SITE_OTHER): Payer: Medicare Other | Admitting: *Deleted

## 2015-02-28 ENCOUNTER — Ambulatory Visit (HOSPITAL_COMMUNITY): Payer: Self-pay | Admitting: *Deleted

## 2015-02-28 DIAGNOSIS — Z7901 Long term (current) use of anticoagulants: Secondary | ICD-10-CM

## 2015-02-28 DIAGNOSIS — I5022 Chronic systolic (congestive) heart failure: Secondary | ICD-10-CM

## 2015-02-28 DIAGNOSIS — Z95811 Presence of heart assist device: Secondary | ICD-10-CM

## 2015-02-28 LAB — PROTIME-INR
INR: 4.5 ratio — AB (ref 0.8–1.0)
PROTHROMBIN TIME: 47.8 s — AB (ref 9.6–13.1)

## 2015-03-01 ENCOUNTER — Encounter (HOSPITAL_COMMUNITY): Payer: Self-pay | Admitting: *Deleted

## 2015-03-07 ENCOUNTER — Other Ambulatory Visit (INDEPENDENT_AMBULATORY_CARE_PROVIDER_SITE_OTHER): Payer: Medicare Other | Admitting: *Deleted

## 2015-03-07 ENCOUNTER — Telehealth (HOSPITAL_COMMUNITY): Payer: Self-pay | Admitting: *Deleted

## 2015-03-07 ENCOUNTER — Other Ambulatory Visit (HOSPITAL_COMMUNITY): Payer: Self-pay | Admitting: *Deleted

## 2015-03-07 ENCOUNTER — Other Ambulatory Visit: Payer: Self-pay | Admitting: *Deleted

## 2015-03-07 DIAGNOSIS — Z95811 Presence of heart assist device: Secondary | ICD-10-CM | POA: Diagnosis not present

## 2015-03-07 DIAGNOSIS — R791 Abnormal coagulation profile: Secondary | ICD-10-CM

## 2015-03-07 DIAGNOSIS — I5022 Chronic systolic (congestive) heart failure: Secondary | ICD-10-CM

## 2015-03-07 DIAGNOSIS — Z7901 Long term (current) use of anticoagulants: Secondary | ICD-10-CM | POA: Diagnosis not present

## 2015-03-07 LAB — PROTIME-INR
INR: >10 ratio (ref 0.8–1.0)
Prothrombin Time: >120 s (ref 9.6–13.1)

## 2015-03-07 NOTE — Progress Notes (Signed)
Pt coming 03/08/15 for repeat INR

## 2015-03-07 NOTE — Addendum Note (Signed)
Addended by: Tonita Phoenix on: 03/07/2015 12:26 PM   Modules accepted: Orders

## 2015-03-07 NOTE — Telephone Encounter (Signed)
Called pt re: critical INR results from [facility] lab today of > 10.0.  Pt denies any bleeding issues, states he has been taking coumadin 4 mg daily as ordered.  Will hold Coumadin tonight and repeat INR here in the am per Dr. Gala Romney.

## 2015-03-08 ENCOUNTER — Ambulatory Visit (HOSPITAL_COMMUNITY)
Admission: RE | Admit: 2015-03-08 | Discharge: 2015-03-08 | Disposition: A | Discharge status: Home / Self Care | Payer: Medicare Other | Source: Ambulatory Visit | Attending: Cardiology | Admitting: Cardiology

## 2015-03-08 ENCOUNTER — Encounter (HOSPITAL_COMMUNITY): Payer: Self-pay | Admitting: *Deleted

## 2015-03-08 ENCOUNTER — Ambulatory Visit (HOSPITAL_COMMUNITY): Payer: Self-pay | Admitting: *Deleted

## 2015-03-08 DIAGNOSIS — R791 Abnormal coagulation profile: Secondary | ICD-10-CM | POA: Diagnosis not present

## 2015-03-08 DIAGNOSIS — I5022 Chronic systolic (congestive) heart failure: Secondary | ICD-10-CM

## 2015-03-08 LAB — PROTIME-INR
INR: 1.83 — ABNORMAL HIGH (ref 0.00–1.49)
Prothrombin Time: 21.1 seconds — ABNORMAL HIGH (ref 11.6–15.2)

## 2015-03-21 ENCOUNTER — Encounter (HOSPITAL_COMMUNITY): Payer: Self-pay | Admitting: Infectious Diseases

## 2015-03-21 ENCOUNTER — Encounter (HOSPITAL_COMMUNITY): Payer: Self-pay

## 2015-03-21 ENCOUNTER — Other Ambulatory Visit (HOSPITAL_COMMUNITY): Payer: Self-pay | Admitting: Infectious Diseases

## 2015-03-21 ENCOUNTER — Ambulatory Visit (HOSPITAL_COMMUNITY): Payer: Self-pay | Admitting: Infectious Diseases

## 2015-03-21 ENCOUNTER — Ambulatory Visit (HOSPITAL_COMMUNITY)
Admission: RE | Admit: 2015-03-21 | Discharge: 2015-03-21 | Disposition: A | Discharge status: Home / Self Care | Payer: Medicare Other | Source: Ambulatory Visit | Attending: Internal Medicine | Admitting: Internal Medicine

## 2015-03-21 VITALS — BP 70/0 | HR 92 | Ht 64.0 in | Wt 187.2 lb

## 2015-03-21 DIAGNOSIS — I5022 Chronic systolic (congestive) heart failure: Secondary | ICD-10-CM | POA: Insufficient documentation

## 2015-03-21 DIAGNOSIS — I4892 Unspecified atrial flutter: Secondary | ICD-10-CM | POA: Diagnosis not present

## 2015-03-21 DIAGNOSIS — I252 Old myocardial infarction: Secondary | ICD-10-CM | POA: Diagnosis not present

## 2015-03-21 DIAGNOSIS — K219 Gastro-esophageal reflux disease without esophagitis: Secondary | ICD-10-CM | POA: Insufficient documentation

## 2015-03-21 DIAGNOSIS — I69954 Hemiplegia and hemiparesis following unspecified cerebrovascular disease affecting left non-dominant side: Secondary | ICD-10-CM | POA: Diagnosis not present

## 2015-03-21 DIAGNOSIS — Z7902 Long term (current) use of antithrombotics/antiplatelets: Secondary | ICD-10-CM | POA: Insufficient documentation

## 2015-03-21 DIAGNOSIS — N189 Chronic kidney disease, unspecified: Secondary | ICD-10-CM | POA: Diagnosis not present

## 2015-03-21 DIAGNOSIS — Z8249 Family history of ischemic heart disease and other diseases of the circulatory system: Secondary | ICD-10-CM | POA: Insufficient documentation

## 2015-03-21 DIAGNOSIS — I129 Hypertensive chronic kidney disease with stage 1 through stage 4 chronic kidney disease, or unspecified chronic kidney disease: Secondary | ICD-10-CM | POA: Insufficient documentation

## 2015-03-21 DIAGNOSIS — I5023 Acute on chronic systolic (congestive) heart failure: Secondary | ICD-10-CM | POA: Diagnosis not present

## 2015-03-21 DIAGNOSIS — Z79899 Other long term (current) drug therapy: Secondary | ICD-10-CM | POA: Diagnosis not present

## 2015-03-21 DIAGNOSIS — I471 Supraventricular tachycardia: Secondary | ICD-10-CM

## 2015-03-21 DIAGNOSIS — I6312 Cerebral infarction due to embolism of basilar artery: Secondary | ICD-10-CM

## 2015-03-21 DIAGNOSIS — Z7901 Long term (current) use of anticoagulants: Secondary | ICD-10-CM | POA: Diagnosis not present

## 2015-03-21 DIAGNOSIS — I428 Other cardiomyopathies: Secondary | ICD-10-CM | POA: Diagnosis not present

## 2015-03-21 DIAGNOSIS — Z95811 Presence of heart assist device: Secondary | ICD-10-CM | POA: Insufficient documentation

## 2015-03-21 DIAGNOSIS — I10 Essential (primary) hypertension: Secondary | ICD-10-CM

## 2015-03-21 DIAGNOSIS — Z7982 Long term (current) use of aspirin: Secondary | ICD-10-CM | POA: Insufficient documentation

## 2015-03-21 LAB — COMPREHENSIVE METABOLIC PANEL
ALK PHOS: 122 U/L (ref 38–126)
ALT: 23 U/L (ref 17–63)
AST: 28 U/L (ref 15–41)
Albumin: 4.1 g/dL (ref 3.5–5.0)
Anion gap: 9 (ref 5–15)
BILIRUBIN TOTAL: 2.9 mg/dL — AB (ref 0.3–1.2)
BUN: 12 mg/dL (ref 6–20)
CALCIUM: 9.1 mg/dL (ref 8.9–10.3)
CO2: 24 mmol/L (ref 22–32)
Chloride: 104 mmol/L (ref 101–111)
Creatinine, Ser: 1.2 mg/dL (ref 0.61–1.24)
GFR calc non Af Amer: >60 mL/min (ref >60)
Glucose, Bld: 93 mg/dL (ref 65–99)
Potassium: 3.8 mmol/L (ref 3.5–5.1)
SODIUM: 137 mmol/L (ref 135–145)
TOTAL PROTEIN: 7.1 g/dL (ref 6.5–8.1)

## 2015-03-21 LAB — CBC
HCT: 43.7 % (ref 39.0–52.0)
Hemoglobin: 14.3 g/dL (ref 13.0–17.0)
MCH: 29 pg (ref 26.0–34.0)
MCHC: 32.7 g/dL (ref 30.0–36.0)
MCV: 88.6 fL (ref 78.0–100.0)
Platelets: 223 10*3/uL (ref 150–400)
RBC: 4.93 MIL/uL (ref 4.22–5.81)
RDW: 16.1 % — ABNORMAL HIGH (ref 11.5–15.5)
WBC: 3.5 10*3/uL — ABNORMAL LOW (ref 4.0–10.5)

## 2015-03-21 LAB — PROTIME-INR
INR: 1.74 — AB (ref 0.00–1.49)
PROTHROMBIN TIME: 20.3 s — AB (ref 11.6–15.2)

## 2015-03-21 LAB — LACTATE DEHYDROGENASE: LDH: 344 U/L — ABNORMAL HIGH (ref 98–192)

## 2015-03-21 MED ORDER — FUROSEMIDE 20 MG PO TABS: 20.0000 mg | ORAL_TABLET | Freq: Every day | ORAL | Status: DC

## 2015-03-21 NOTE — Progress Notes (Addendum)
Symptom  Yes  No  Details   Angina         x Activity:   Claudication         x How far:    Syncope         x When:    Stroke        x  Jan 2015  Orthopnea         x How many pillows: one; sleeps flat   PND               x How often:   CPAP      N/A How many hrs:   Pedal edema               x      Abd fullness               x    N&V         x Good appetite; eating full meal   Diaphoresis         x When:  Bleeding        x   Urine color    light yellow  SOB        x        Activity: Incline; more fatigue in legs than SOB  Palpitations         x When:  ICD shock         x   Hospitlizaitons                x When/where/why:    ED visit         x When/where/why:  Other MD         x When/who/why:  Activity           Working 7 hours five days week  Fluid           No limitations   Diet     No limitations   Vital signs: HR:  92 Doppler MAP: 70 Auto cuff: 110/89 (95) O2 Sat: 96 Wt: 187.2 lbs   Last wt:  194.2 lbs Ht: 5'4"  LVAD interrogation reveals:  Speed:  9000 Flow:  4.2 Power:  4.7 PI:  7.5 Alarms: LOW FLOW alarms (flows recorded at 2.4)  9/18 @ 07:26  9/19 @ 05:29  9/21 @ 05:48 & 23:38   9/26 @ 02:49 & 03:42 Events:  Increased 10 - 20 daily with outilier on 03/15/15 with 36 events. 3 - 5 PI events/day previously on higher speed.   Fixed speed:  8600 Low speed limit: 8000  Primary Controller:  Replace back up battery in 22 months (03/2016)  [id number]; version V7.23 Back up controller:   Replace back up battery in  22 months (08/2015)  [id number]; version V7.23  Back up controller set to 8600 / 8000 at today's visit  LVAD Exit Site: VAD dressing dry and intact with attachment device accurately applied and driveline secured. Approximately 1 - 2 cm of velour remains outside of exit site with old bloody drainage noted.  Pt denies fever or chills. Driveline dressing is being changed weekly by his caregiver and he reports to have adequate supplies at home.   Clinic  Encounter:  Presents to clinic today for 89m FU appointment. Multiple Low flow alarms noted - much less on lower speed however: only had 6 since 03/11/15; all still occur while patient is in bed. CT of Chest Reports--The position of the  inflow cannula is very angulated, with tip directed toward the apical anterior wall. Increased PI events from previous visit 10 - 25 daily. Weight down significantly since last visit (almost 10 lbs). Reports taking diuretic as prescribed (40 mg QD); was diuresed at last clinic visit for fluid over-load. Reports some dizziness when rising quickly from lying in bed. Goes away on its own. Reports that the decrease in speed has him feeling about the same. Automatic BP cuff not picking up this visit with PIs ranging from 2.8 - 3.2. EKG verified to ensure no arrhythmias (NSR with HR 82) and Hgb stable at 14.   MAP 70 on current regimen with auto cuff not picking up with low pulsatility index as mentioned above. Hopefully with decreased diuretic this will improve.   Resumed 325 mg ASA since last visit.   Decrease lasix to 20 mg QD (PI down and weight down quite a bit).   RCT in 1 month.   Rexene Alberts, RN, BSN, CCRN  VAD Coordinator   Office: [phone] 24/7 VAD Pager: [phone]  HPI: [name] is a 54 yo male with a history of severe CHF, NICM s/p LVAD and TVR (09/2011), VT, NSTEMI, LV thrombus, CKD, GERD, stroke and LVAD hemolysis with LVAD exchange (08/2013).   He was admitted to [facility] January 2015 with NSTEMI and RV failure. He had a difficult course that was complicated by embolic right CVA. He was intubated and eventually had a tracheostomy placed 07/28/13. He was transferred to inpatient rehab for extensive rehab and was making progression, however despite aggressive therapy with heparin, coumadin, ASA and Plavix, his LDH remained persistently elevated in the 950 range (was about 1750 on admit). He did not experience any pump dysfunction, however the case was discussed with  Dr Allena Katz and [name] at [facility] and it was felt to transfer him down there for possible pump exchange. He underwent a pump exchange 08/31/13. Post op was complicated by bleeding and he received multiple transfusions. He had AKI and had intermittent HD.  Admitted 5/13-5/18/15 for volume overload and possible hemolysis. LDH 647 and INR 1.69 on admission. Treated with IV lasix and IV heparin. While in the hospital developed some PI events and speed was decreased to 9400. ICD interrogated and showed A flutter and he was placed on IV amiodarone and then transitioned to 200 mg PO BID. He cardioverted to NSR on 5/15. LDH trended down and on discharge was 530 and INR 2.4.  In January 2016 admitted with multiple low flow alarms. Diuretics held and given IVF with improvement. Echo showed severe RV dysfunction  Admitted May 17,2016 from HF.LVAD clinic with volume overload. Diuresed with IV lasix.Multiple low flow alarms particularly in the am felt to be positional. Discharge weight was 175 pounds.    Ramp echo in 7/16, decreased speed to 8600 rpm.   Follow up for Heart Failure and LVAD:  He has lost 10 lbs since last appointment, now taking Lasix 40 mg daily.  Much less low flow alarms but now getting 10-25 PI events/day.  Mild LH with standing.  No exertional dyspnea.  No chest pain, orthopnea, PND, palpitations.    Denies driveline trauma, erythema or drainage.  Denies ICD shocks.   Reports taking Coumadin as prescribed and adherence to anticoagulation based dietary restrictions.  Denies bright red blood per rectum or melena, no dark urine or hematuria.    ECG: NSR, RBBB  SH: Disabled, lives at home with caregiver. No ETOH or smoking  FH: Mother living;  HTN        Father living; no health issues    Past Medical History  Diagnosis Date  . CHF (congestive heart failure)     EF- 10-15  . Medically noncompliant   . Mitral regurgitation   . Tobacco user   . HTN (hypertension)   . AICD (automatic  cardioverter/defibrillator) present   . GERD (gastroesophageal reflux disease)   . Substance abuse   . Chronic renal insufficiency   . Syncope   . Thrombus 08/06/2010  . SYSTOLIC HEART FAILURE, CHRONIC 09/22/2008    Qualifier: Diagnosis of  By: Gala Romney, MD, Trixie Dredge   . LV (left ventricular) mural thrombus 01/28/2011  . ICD - IN SITU 09/16/2008    Qualifier: Diagnosis of  By: Wonda Amis    . MITRAL STENOSIS/ INSUFFICIENCY, NON-RHEUMATIC 09/22/2008    Qualifier: Diagnosis of  By: Gala Romney, MD, Trixie Dredge Hepatomegaly 09/16/2008    Qualifier: Diagnosis of  By: Wonda Amis    . High cholesterol 02/26/2012    "at one time"  . Sleep apnea   . Exertional dyspnea 02/26/2012  . History of blood transfusion 08/2011    "when I had heart pump"  . Migraines   . COMMON MIGRAINE 06/14/2009    Qualifier: Diagnosis of  By: Jonny Ruiz MD, Len Blalock   . History of gout 02/26/2012  . Depression 08/11/2013    Pt denies  . Bipolar affective disorder 10/22/2011    pt denies this hx 02/26/2012    Current Outpatient Prescriptions  Medication Sig Dispense Refill  . aspirin 325 MG EC tablet Take 1 tablet (325 mg total) by mouth daily. 30 tablet 6  . carvedilol (COREG) 3.125 MG tablet Take 1 tablet (3.125 mg total) by mouth 2 (two) times daily with a meal. 60 tablet 6  . clopidogrel (PLAVIX) 75 MG tablet Take 1 tablet (75 mg total) by mouth daily with breakfast. 30 tablet 6  . furosemide (LASIX) 20 MG tablet Take 1 tablet (20 mg total) by mouth daily. Or as directed 45 tablet 6  . hydrALAZINE (APRESOLINE) 50 MG tablet Take 1 tablet (50 mg total) by mouth 3 (three) times daily. 270 tablet 3  . losartan (COZAAR) 100 MG tablet Take 1 tablet (100 mg total) by mouth daily. 30 tablet 6  . potassium chloride SA (K-DUR,KLOR-CON) 20 MEQ tablet Take 2 tablets (40 mEq total) by mouth 2 (two) times daily. 60 tablet 6  . tamsulosin (FLOMAX) 0.4 MG CAPS capsule Take 1 capsule (0.4 mg total) by mouth  daily. 30 capsule 6  . warfarin (COUMADIN) 4 MG tablet TAKE ONE TABLETONCE DAILY OR AS INSTRUCTED FOR INR 2.0 - 3.0 60 tablet 5  . atorvastatin (LIPITOR) 40 MG tablet Take 1 tablet (40 mg total) by mouth daily. 30 tablet 6   No current facility-administered medications for this encounter.   Facility-Administered Medications Ordered in Other Encounters  Medication Dose Route Frequency Provider Last Rate Last Dose  . sodium chloride 0.9 % injection 10-40 mL  10-40 mL Intracatheter PRN [name], MD        Ace inhibitors and Lexapro  REVIEW OF SYSTEMS: All systems negative except as listed in HPI, PMH and Problem list.    LVAD interrogation reveals:  See LVAD nurse note above.   I reviewed the LVAD parameters from today, and compared the results to the patient's prior recorded data. No changes to LVAD speed. The LVAD is functioning within  specified parameters.  The patient performs LVAD self-test daily.  LVAD interrogation was negative for any significant power changes, alarms or PI events/speed drops.  LVAD equipment check completed and is in good working order.  Back-up equipment present.   LVAD education done on emergency procedures and precautions and reviewed exit site care.    Filed Vitals:   03/21/15 1448  BP: 70/0  Pulse: 92  Height:  (1.626 m)  Weight: 187 lb 3.2 oz (84.913 kg)  SpO2: 92%   Physical Exam: GENERAL: Well appearing, male who presents to clinic today HEENT: normal  NECK: Supple, no elevated. No lymphadenopathy or thyromegaly appreciated.   CARDIAC:  Mechanical heart sounds with LVAD hum present, prominent heart sounds LUNGS:  Clear to auscultation bilaterally.  ABDOMEN:  Soft, round, distended, positive bowel sounds x4.     LVAD exit site: Drive line out 1 cm with expose velour. No erythema or drainage.  Stabilization device present and accurately applied. Dressing being changed weekly. EXTREMITIES:  Warm and dry, no cyanosis, clubbing, rash, no  edema.  NEUROLOGIC:  Alert and oriented x 4.  L-sided weakness. Gait steady.  No aphasia.  Affect pleasant.      ASSESSMENT AND PLAN:   1) Chronic systolic HF: NICM, s/p LVAD implant (08/2011) and subsequent (08/2013).  Stable NYHA II Weight down with decreased PI, more PI events, and occasional mild LH with standing.   - Decrease Lasix to 20 mg daily. He will increase Lasix to 40 mg daily x 3 days with 3 lb weight gain.  - Reinforced the need and importance of daily weights, a low sodium diet, and fluid restriction (less than 2 L a day). Instructed to call the HF clinic if weight increases more than 3 lbs overnight or 5 lbs in a week.  2) LVAD, s/p (08/2011) and then (08/2013) d/t pump thrombosis.  VAD interrogated personally. Much less low flow alarms this visit after speed adjustment. CT chest showed no kink in outflow graft. Multiple PI events now with lower PI, likely due to increased Lasix last visit. - As above, decreasing Lasix.  - Not a tx candidate due to compliance issues.  3) HTN: Controlled. 4) CVA: Stable.Continues to have left sided weakness. No longer in therapy. Continue atorvastatin 40 mg daily.  6) h/o Atrial Flutter: now in NSR 7) Anticoagulation management: INR Goal 2.5-3.0. Will check INR today and adjust accordingly. He is on ASA 325 daily as well. 8) RV failure: stable  Marca Ancona MD  03/22/2015

## 2015-03-21 NOTE — Patient Instructions (Signed)
.   Clean and inspect your equipment daily . Keep necessary backup equipment with you at all times . Report any signs of exit site infection to your LVAD team immediately . Eat a low-sodium, heart healthy diet with consistent vitamin K intake (this is more than just picking up a shaker--read the labels) . Drink a max of 2 liters of fluids daily, unless advised otherwise  . Check your weight everyday, at the same time, the same way . Don't forget to exercise!!  . NO MRI's (CT Scans or X-Rays are OK) . Inform your VAD Coordinator anytime you are in need of any procedure (we need to assist with Coumadin management) If you are going to go to the dentist for a cleaning or procedure please let us know so we may give you a preventative antibiotic to decrease your risk for infection.   2. DECREASE your Lasix to ONE PILL every day   If you notice your weight increasing by 3 lbs in a day or 5 lbs in a week you need to take an extra dose of lasix.   3. Come back to the VAD Clinic in 1 month--but please call us if you need [place].

## 2015-03-22 NOTE — Addendum Note (Signed)
Encounter addended by: Laurey Morale, MD on: 03/22/2015  9:28 AM<BR>     Documentation filed: Charges VN

## 2015-03-29 ENCOUNTER — Other Ambulatory Visit (INDEPENDENT_AMBULATORY_CARE_PROVIDER_SITE_OTHER): Payer: Medicare Other

## 2015-03-29 ENCOUNTER — Ambulatory Visit (HOSPITAL_COMMUNITY): Payer: Self-pay | Admitting: *Deleted

## 2015-03-29 DIAGNOSIS — I5022 Chronic systolic (congestive) heart failure: Secondary | ICD-10-CM

## 2015-03-29 DIAGNOSIS — I059 Rheumatic mitral valve disease, unspecified: Secondary | ICD-10-CM | POA: Diagnosis not present

## 2015-03-30 ENCOUNTER — Encounter (HOSPITAL_COMMUNITY): Payer: Self-pay | Admitting: *Deleted

## 2015-03-30 LAB — PROTIME-INR
INR: 2.62 — ABNORMAL HIGH (ref <1.50)
Prothrombin Time: 28.4 seconds — ABNORMAL HIGH (ref 11.6–15.2)

## 2015-04-20 ENCOUNTER — Other Ambulatory Visit (HOSPITAL_COMMUNITY): Payer: Self-pay | Admitting: Infectious Diseases

## 2015-04-20 ENCOUNTER — Ambulatory Visit (HOSPITAL_COMMUNITY)
Admission: RE | Admit: 2015-04-20 | Discharge: 2015-04-20 | Disposition: A | Discharge status: Home / Self Care | Payer: Medicare Other | Source: Ambulatory Visit | Attending: Internal Medicine | Admitting: Internal Medicine

## 2015-04-20 ENCOUNTER — Telehealth (HOSPITAL_COMMUNITY): Payer: Self-pay | Admitting: Infectious Diseases

## 2015-04-20 ENCOUNTER — Telehealth: Payer: Self-pay | Admitting: Internal Medicine

## 2015-04-20 ENCOUNTER — Encounter (HOSPITAL_COMMUNITY): Payer: Self-pay

## 2015-04-20 ENCOUNTER — Ambulatory Visit (HOSPITAL_COMMUNITY): Payer: Self-pay | Admitting: Infectious Diseases

## 2015-04-20 VITALS — BP 105/81 | HR 83 | Ht 64.0 in | Wt 199.0 lb

## 2015-04-20 DIAGNOSIS — Z95811 Presence of heart assist device: Secondary | ICD-10-CM

## 2015-04-20 DIAGNOSIS — I10 Essential (primary) hypertension: Secondary | ICD-10-CM

## 2015-04-20 DIAGNOSIS — Z7901 Long term (current) use of anticoagulants: Secondary | ICD-10-CM

## 2015-04-20 DIAGNOSIS — Z7982 Long term (current) use of aspirin: Secondary | ICD-10-CM | POA: Diagnosis not present

## 2015-04-20 DIAGNOSIS — Z7902 Long term (current) use of antithrombotics/antiplatelets: Secondary | ICD-10-CM | POA: Diagnosis not present

## 2015-04-20 DIAGNOSIS — I5022 Chronic systolic (congestive) heart failure: Secondary | ICD-10-CM

## 2015-04-20 DIAGNOSIS — E7800 Pure hypercholesterolemia, unspecified: Secondary | ICD-10-CM | POA: Diagnosis not present

## 2015-04-20 DIAGNOSIS — I130 Hypertensive heart and chronic kidney disease with heart failure and stage 1 through stage 4 chronic kidney disease, or unspecified chronic kidney disease: Secondary | ICD-10-CM | POA: Insufficient documentation

## 2015-04-20 DIAGNOSIS — I69354 Hemiplegia and hemiparesis following cerebral infarction affecting left non-dominant side: Secondary | ICD-10-CM | POA: Insufficient documentation

## 2015-04-20 DIAGNOSIS — Z79899 Other long term (current) drug therapy: Secondary | ICD-10-CM

## 2015-04-20 DIAGNOSIS — Z95810 Presence of automatic (implantable) cardiac defibrillator: Secondary | ICD-10-CM | POA: Insufficient documentation

## 2015-04-20 DIAGNOSIS — Z9119 Patient's noncompliance with other medical treatment and regimen: Secondary | ICD-10-CM | POA: Insufficient documentation

## 2015-04-20 DIAGNOSIS — I428 Other cardiomyopathies: Secondary | ICD-10-CM | POA: Diagnosis not present

## 2015-04-20 DIAGNOSIS — N189 Chronic kidney disease, unspecified: Secondary | ICD-10-CM | POA: Diagnosis not present

## 2015-04-20 DIAGNOSIS — Z8249 Family history of ischemic heart disease and other diseases of the circulatory system: Secondary | ICD-10-CM | POA: Insufficient documentation

## 2015-04-20 DIAGNOSIS — K219 Gastro-esophageal reflux disease without esophagitis: Secondary | ICD-10-CM | POA: Diagnosis not present

## 2015-04-20 LAB — BASIC METABOLIC PANEL
Anion gap: 10 (ref 5–15)
BUN: 9 mg/dL (ref 6–20)
CALCIUM: 9.3 mg/dL (ref 8.9–10.3)
CO2: 24 mmol/L (ref 22–32)
CREATININE: 1.1 mg/dL (ref 0.61–1.24)
Chloride: 106 mmol/L (ref 101–111)
GFR calc non Af Amer: >60 mL/min (ref >60)
Glucose, Bld: 95 mg/dL (ref 65–99)
Potassium: 4.2 mmol/L (ref 3.5–5.1)
Sodium: 140 mmol/L (ref 135–145)

## 2015-04-20 LAB — LACTATE DEHYDROGENASE: LDH: 319 U/L — AB (ref 98–192)

## 2015-04-20 LAB — CBC
HCT: 44.5 % (ref 39.0–52.0)
HEMOGLOBIN: 14.2 g/dL (ref 13.0–17.0)
MCH: 29 pg (ref 26.0–34.0)
MCHC: 31.9 g/dL (ref 30.0–36.0)
MCV: 91 fL (ref 78.0–100.0)
PLATELETS: 198 10*3/uL (ref 150–400)
RBC: 4.89 MIL/uL (ref 4.22–5.81)
RDW: 16.4 % — ABNORMAL HIGH (ref 11.5–15.5)
WBC: 3.3 10*3/uL — AB (ref 4.0–10.5)

## 2015-04-20 LAB — PROTIME-INR
INR: 4.01 — AB (ref 0.00–1.49)
PROTHROMBIN TIME: 38.1 s — AB (ref 11.6–15.2)

## 2015-04-20 MED ORDER — TORSEMIDE 20 MG PO TABS: 20.0000 mg | ORAL_TABLET | Freq: Once | ORAL | Status: DC

## 2015-04-20 NOTE — Progress Notes (Signed)
Symptom  Yes  No  Details   Angina         x Activity:   Claudication         x How far:    Syncope         x When:    Stroke        x  Jan 2015  Orthopnea         x How many pillows: one; sleeps flat   PND               x How often:   CPAP      N/A How many hrs:   Pedal edema               x      Abd fullness               x    N&V         x Good appetite; eating full meal   Diaphoresis         x When:  Bleeding        x   Urine color    light yellow  SOB        x        Activity: Incline; more fatigue in legs than SOB  Palpitations         x When:  ICD shock         x   Hospitlizaitons                x When/where/why:    ED visit         x When/where/why:  Other MD         x When/who/why:  Activity           Working 7 hours five days week  Fluid           No limitations   Diet     No limitations   Vital signs: HR:  92 Doppler MAP: 88 Auto cuff: 105/81 (93) O2 Sat: 96 Wt: 199 lbs   Last wt: 187.2 lbs   Ht: 5'4"  LVAD interrogation reveals:  Speed:  8600 Flow: 3.7 Power: 4.2 PI: 6.8 Alarms: Events: 5 - 15 daily PI events/day  LOW FLOWS:  10/25--2  10/22--6  10/21--5  10/20--27  10/19--7  10/16--1 **CT of Chest Reports--The position of the inflow cannula is very angulated, with tip directed toward the apical anterior wall. Positional events-->encouraged him to turn to another position when these alarms occur.    Fixed speed:  8600 Low speed limit: 8000  Primary Controller:  Replace back up battery in 22 months (03/2016)  [id number]; version V7.23 Back up controller:   Replace back up battery in  22 months (08/2015)  [id number]; version V7.23  Back up controller not present today  LVAD Exit Site: VAD dressing dry and intact with attachment device accurately applied and driveline secured. Approximately 1 - 2 cm of velour remains outside of exit site with old bloody drainage noted.  Pt denies fever or chills. Driveline dressing is being changed weekly by his  caregiver and he reports to have adequate supplies at home.   Clinic Encounter:  Presents to clinic today for 10m FU appointment. No complaints aside from requesting a referral for podiatry for toe nail clipping. Reports he is up a few pounds in weight and would like to start taking a diet pill with grapefruit--Advised against taking  this as grapefruit interacts heavily with coumadin and will increase his risk for bleeding as he is already difficult to manage with INRs. Multiple Low flow alarms noted as above. Stable PI events from previous visit 5 - 15 daily. PI much improved compared to last visit with decreased diuretic; although he reports taking double dose of diuretic all last week. Weight up ~10lbs since last visit. Currently stopped working last week and is no longer doing much aside from staying at home and going to grocery store a few times a week.    MAP 88 - 93 on current regimen with auto cuff picking up today with improvement in PI values. Fluid overloaded 2-3+ edema peripherally, abdominal bloating and elevated JVP per Dr. Prescott Gum exam. Changing furosemide to torsemide with same potassium coverage. Assess BMET in 1 week.   LDH 319 --> stable for baseline (has previously had pump thrombus and exchange).   INR 4.0 today --> difficult to manage with history of drinking occasionally and self-titrating medications. INR goal 2.5 - 3.0 on FD ASA and plavix as well. (previous thrombus, CVA x 2 and lower speed for RV failure).   RCT in 3 weeks. Lab work (INR / BMET next Friday 04/28/15).   Rexene Alberts, RN, BSN, CCRN  VAD Coordinator   Office: [phone] 24/7 VAD Pager: [phone]

## 2015-04-20 NOTE — Patient Instructions (Signed)
1. STOP taking lasix and start taking torsemide ONE 20 mg pill daily  2. Continue taking potassium 40 mEq (2 pills) twice a day.  3. No coumadin today--INR is 4.0  4. Come back to VAD clinic in 3 weeks  5. Appointment next Friday at [facility] lab for blood work.   6. [facility] #[phone]--to get an appointment for toe nail clipping.

## 2015-04-20 NOTE — Progress Notes (Signed)
ADVANCED HF CLINIC NOTE  Patient ID: [name], male   DOB: 1961-02-01, 54 y.o.   MRN: [id number]  HPI: [name] is a 54 yo male with a history of severe CHF, NICM s/p LVAD and TVR (09/2011), VT, NSTEMI, LV thrombus, CKD, GERD, stroke and LVAD hemolysis with LVAD exchange (08/2013).   He was admitted to [facility] January 2015 with NSTEMI and RV failure. He had a difficult course that was complicated by embolic right CVA. He was intubated and eventually had a tracheostomy placed 07/28/13. He was transferred to inpatient rehab for extensive rehab and was making progression, however despite aggressive therapy with heparin, coumadin, ASA and Plavix, his LDH remained persistently elevated in the 950 range (was about 1750 on admit). He did not experience any pump dysfunction, however the case was discussed with Dr [name] and [name] at [facility] and it was felt to transfer him down there for possible pump exchange. He underwent a pump exchange 08/31/13. Post op was complicated by bleeding and he received multiple transfusions. He had AKI and had intermittent HD.  Admitted 5/13-5/18/15 for volume overload and possible hemolysis. LDH 647 and INR 1.69 on admission. Treated with IV lasix and IV heparin. While in the hospital developed some PI events and speed was decreased to 9400. ICD interrogated and showed A flutter and he was placed on IV amiodarone and then transitioned to 200 mg PO BID. He cardioverted to NSR on 5/15. LDH trended down and on discharge was 530 and INR 2.4.  In January 2016 admitted with multiple low flow alarms. Diuretics held and given IVF with improvement. Echo showed severe RV dysfunction  Admitted May 17,2016 from HF.LVAD clinic with volume overload. Diuresed with IV lasix.Multiple low flow alarms particularly in the am felt to be positional. Discharge weight was 175 pounds.    Ramp echo in 7/16, decreased speed to 8600 rpm.   Follow up for Heart Failure and LVAD:  Overall feeling ok. He has gained 10  lbs since last appointment when lasix decreased to 20 daily.  Not watching diet or fluid intake well. Mild exertional dyspnea.  No chest pain, orthopnea, PND, palpitations. Taking meds. Still with low flow alarms when lying down.   Denies driveline trauma, erythema or drainage.  Denies ICD shocks.   Reports taking Coumadin as prescribed and adherence to anticoagulation based dietary restrictions.  Denies bright red blood per rectum or melena, no dark urine or hematuria.   LVAD interrogation reveals:  Speed: 8600 Flow: 3.7 Power: 4.2 PI: 6.8 Alarms: Events: 5 - 15 daily PI events/day  LOW FLOWS: 10/25--2 10/22--6 10/21--5 10/20--27 10/19--7 10/16--1  Fixed speed: 8600 Low speed limit: 8000  Primary Controller: Replace back up battery in 22 months (03/2016) [id number]; version V7.23 Back up controller: Replace back up battery in 22 months (08/2015) [id number]; version V7.23   I reviewed the LVAD parameters from today, and compared the results to the patient's prior recorded data. No changes to LVAD speed. The LVAD is functioning within specified parameters.  The patient performs LVAD self-test daily.  LVAD interrogation was negative for any significant power changes, alarms or PI events/speed drops.  LVAD equipment check completed and is in good working order.  Back-up equipment present.   LVAD education done on emergency procedures and precautions and reviewed exit site care.    ECG: NSR, RBBB  SH: Disabled, lives at home with caregiver. No ETOH or smoking  FH: Mother living; HTN        Father  living; no health issues    Past Medical History  Diagnosis Date  . CHF (congestive heart failure) (HCC)     EF- 10-15  . Medically noncompliant   . Mitral regurgitation   . Tobacco user   . HTN (hypertension)   . AICD (automatic cardioverter/defibrillator) present   . GERD (gastroesophageal  reflux disease)   . Substance abuse   . Chronic renal insufficiency   . Syncope   . Thrombus 08/06/2010  . SYSTOLIC HEART FAILURE, CHRONIC 09/22/2008    Qualifier: Diagnosis of  By: Gala Romney, MD, Trixie Dredge   . LV (left ventricular) mural thrombus (HCC) 01/28/2011  . ICD - IN SITU 09/16/2008    Qualifier: Diagnosis of  By: Wonda Amis    . MITRAL STENOSIS/ INSUFFICIENCY, NON-RHEUMATIC 09/22/2008    Qualifier: Diagnosis of  By: Gala Romney, MD, Trixie Dredge Hepatomegaly 09/16/2008    Qualifier: Diagnosis of  By: Wonda Amis    . High cholesterol 02/26/2012    "at one time"  . Sleep apnea   . Exertional dyspnea 02/26/2012  . History of blood transfusion 08/2011    "when I had heart pump"  . Migraines   . COMMON MIGRAINE 06/14/2009    Qualifier: Diagnosis of  By: Jonny Ruiz MD, Len Blalock   . History of gout 02/26/2012  . Depression 08/11/2013    Pt denies  . Bipolar affective disorder (HCC) 10/22/2011    pt denies this hx 02/26/2012    Current Outpatient Prescriptions  Medication Sig Dispense Refill  . aspirin 325 MG EC tablet Take 1 tablet (325 mg total) by mouth daily. 30 tablet 6  . atorvastatin (LIPITOR) 40 MG tablet Take 1 tablet (40 mg total) by mouth daily. 30 tablet 6  . carvedilol (COREG) 3.125 MG tablet Take 1 tablet (3.125 mg total) by mouth 2 (two) times daily with a meal. 60 tablet 6  . clopidogrel (PLAVIX) 75 MG tablet Take 1 tablet (75 mg total) by mouth daily with breakfast. 30 tablet 6  . hydrALAZINE (APRESOLINE) 50 MG tablet Take 1 tablet (50 mg total) by mouth 3 (three) times daily. 270 tablet 3  . losartan (COZAAR) 100 MG tablet Take 1 tablet (100 mg total) by mouth daily. 30 tablet 6  . potassium chloride SA (K-DUR,KLOR-CON) 20 MEQ tablet Take 2 tablets (40 mEq total) by mouth 2 (two) times daily. 60 tablet 6  . tamsulosin (FLOMAX) 0.4 MG CAPS capsule Take 1 capsule (0.4 mg total) by mouth daily. 30 capsule 6  . torsemide (DEMADEX) 20 MG tablet Take 1  tablet (20 mg total) by mouth once. 180 tablet 3  . warfarin (COUMADIN) 4 MG tablet TAKE ONE TABLETONCE DAILY OR AS INSTRUCTED FOR INR 2.0 - 3.0 60 tablet 5   No current facility-administered medications for this encounter.   Facility-Administered Medications Ordered in Other Encounters  Medication Dose Route Frequency Provider Last Rate Last Dose  . sodium chloride 0.9 % injection 10-40 mL  10-40 mL Intracatheter PRN [name], MD        Ace inhibitors and Lexapro  REVIEW OF SYSTEMS: All systems negative except as listed in HPI, PMH and Problem list.    Vital signs: HR: 92 Doppler MAP: 88 Auto cuff: 105/81 (93) O2 Sat: 96 Wt: 199 lbs  Last wt: 187.2 lbs  Ht: 5'4"  Filed Vitals:   04/20/15 1122 04/20/15 1137  BP: 88/0 105/81  Pulse: 83   Height: 5\' 4"  (1.626  m)   Weight: 199 lb (90.266 kg)   SpO2: 99%    Physical Exam: GENERAL: Well appearing, male who presents to clinic today HEENT: normal  NECK: Supple, JVP 8-9  No lymphadenopathy or thyromegaly appreciated.   CARDIAC:  Mechanical heart sounds with LVAD hum present, prominent heart sounds LUNGS:  Clear to auscultation bilaterally.  ABDOMEN:  Soft, round, mildly distended, positive bowel sounds x4.     LVAD exit site: Drive line out 1 cm with expose velour. No erythema or drainage.  Stabilization device present and accurately applied. Dressing being changed weekly. EXTREMITIES:  Warm and dry, no cyanosis, clubbing, rash,1-2+ edema.  NEUROLOGIC:  Alert and oriented x 4.  L-sided weakness. Gait steady.  No aphasia.  Affect pleasant.      ASSESSMENT AND PLAN:   1) Chronic systolic HF: NICM, s/p LVAD implant (08/2011) and subsequent (08/2013).  Stable NYHA II - Volume status elevated. Will start torsemide 20 daily. Watch for overdiuresis.  - Reinforced the need and importance of daily weights, a low sodium diet, and fluid restriction (less than 2 L a day). Instructed to call the HF clinic if weight increases  more than 3 lbs overnight or 5 lbs in a week.  2) LVAD, s/p (08/2011) and then (08/2013) d/t pump thrombosis.  VAD interrogated personally. Continues with low flow alarms likely positional in nature. CT chest showed no kink in outflow graft. - Not a tx candidate due to compliance issues.  3) HTN: Controlled. 4) CVA: Stable.Continues to have left sided weakness. No longer in therapy. Continue atorvastatin 40 mg daily.  6) h/o Atrial Flutter: now in NSR 7) Anticoagulation management: INR Goal 2.5-3.0. Will check INR today and adjust accordingly. He is on ASA 325 daily as well. 8) RV failure: stable  Arvilla Meres MD  04/20/2015

## 2015-04-20 NOTE — Telephone Encounter (Signed)
error 

## 2015-04-20 NOTE — Telephone Encounter (Signed)
Called Tenisha to update about medication changes at clinic visit today. Also informed that I attempted to schedule an appointment at the [facility] for toenail clipping and they would not accept referral from specialty clinic only his PCP. I called PCP to assist with referral, however he has not seen Dr. Jonny Ruiz since 2013 and is no longer considered a patient of his. I advised Irish Lack that he could either try to re-establish care at [facility] with him or seek out another PCP to obtain referral. She verbalized understanding.

## 2015-04-27 ENCOUNTER — Ambulatory Visit (INDEPENDENT_AMBULATORY_CARE_PROVIDER_SITE_OTHER): Payer: Medicare Other | Admitting: Podiatry

## 2015-04-27 ENCOUNTER — Encounter: Payer: Self-pay | Admitting: Podiatry

## 2015-04-27 VITALS — BP 108/78 | HR 61 | Resp 14 | Ht 63.0 in | Wt 185.0 lb

## 2015-04-27 DIAGNOSIS — B351 Tinea unguium: Secondary | ICD-10-CM

## 2015-04-27 DIAGNOSIS — I6312 Cerebral infarction due to embolism of basilar artery: Secondary | ICD-10-CM

## 2015-04-27 DIAGNOSIS — M79676 Pain in unspecified toe(s): Secondary | ICD-10-CM

## 2015-04-27 NOTE — Progress Notes (Signed)
   Subjective:    Patient ID: [name], male    DOB: [birth year], 54 y.o.   MRN: [id number]  HPI This patient presents to the office with long thick nails. The patient presents here today for B/L toenail debridement.He says he has been in the hospital for six months, released in March and presenting to the office for nail care today. He says his nails are long thick and painful walking and wearing his shoes.  Review of Systems  All other systems reviewed and are negative.      Objective:   Physical Exam GENERAL APPEARANCE: Alert, conversant. Appropriately groomed. No acute distress.  VASCULAR: Pedal pulses palpable at  [facility] and PT right foot.  DP and PT are non palpable secondary to swelling due to CVA..  Capillary refill time is immediate to all digits,  Normal temperature gradient.    NEUROLOGIC: sensation is normal to 5.07 monofilament at 5/5 sites bilateral.  Light touch is intact bilateral, Muscle strength normal.  MUSCULOSKELETAL: acceptable muscle strength, tone and stability bilateral.  Intrinsic muscluature intact bilateral.  Rectus appearance of foot and digits noted bilateral. Limited ROM left foot in rearfoot/ankle complex.  DERMATOLOGIC: skin color, texture, and turgor are within normal limits.  No preulcerative lesions or ulcers  are seen, no interdigital maceration noted.  No open lesions present.  . No drainage noted.  NAILS  Thick disfigured discolored nails both feet.         Assessment & Plan:  Onychomycosis B/L  IE  Debridement and grinding of long painful nails.

## 2015-04-28 ENCOUNTER — Other Ambulatory Visit (INDEPENDENT_AMBULATORY_CARE_PROVIDER_SITE_OTHER): Payer: Medicare Other

## 2015-04-28 DIAGNOSIS — I509 Heart failure, unspecified: Secondary | ICD-10-CM | POA: Diagnosis not present

## 2015-04-28 DIAGNOSIS — I8290 Acute embolism and thrombosis of unspecified vein: Secondary | ICD-10-CM

## 2015-04-28 DIAGNOSIS — I5022 Chronic systolic (congestive) heart failure: Secondary | ICD-10-CM

## 2015-04-28 LAB — BASIC METABOLIC PANEL
BUN: 14 mg/dL (ref 7–25)
CHLORIDE: 102 mmol/L (ref 98–110)
CO2: 25 mmol/L (ref 20–31)
Calcium: 9 mg/dL (ref 8.6–10.3)
Creat: 0.94 mg/dL (ref 0.70–1.33)
Glucose, Bld: 59 mg/dL — ABNORMAL LOW (ref 65–99)
POTASSIUM: 3.6 mmol/L (ref 3.5–5.3)
SODIUM: 135 mmol/L (ref 135–146)

## 2015-04-29 LAB — PROTIME-INR
INR: 2.75 — ABNORMAL HIGH (ref <1.50)
Prothrombin Time: 29.5 seconds — ABNORMAL HIGH (ref 11.6–15.2)

## 2015-05-01 ENCOUNTER — Ambulatory Visit (INDEPENDENT_AMBULATORY_CARE_PROVIDER_SITE_OTHER): Payer: Medicare Other | Admitting: Internal Medicine

## 2015-05-01 ENCOUNTER — Ambulatory Visit (HOSPITAL_COMMUNITY): Payer: Self-pay | Admitting: Infectious Diseases

## 2015-05-01 ENCOUNTER — Encounter: Payer: Self-pay | Admitting: Internal Medicine

## 2015-05-01 ENCOUNTER — Encounter (HOSPITAL_COMMUNITY): Payer: Self-pay | Admitting: Infectious Diseases

## 2015-05-01 VITALS — BP 90/0 | HR 83 | Ht 63.0 in | Wt 200.6 lb

## 2015-05-01 DIAGNOSIS — I5022 Chronic systolic (congestive) heart failure: Secondary | ICD-10-CM | POA: Diagnosis not present

## 2015-05-01 DIAGNOSIS — I6312 Cerebral infarction due to embolism of basilar artery: Secondary | ICD-10-CM

## 2015-05-01 DIAGNOSIS — Z95810 Presence of automatic (implantable) cardiac defibrillator: Secondary | ICD-10-CM

## 2015-05-01 DIAGNOSIS — I472 Ventricular tachycardia, unspecified: Secondary | ICD-10-CM

## 2015-05-01 DIAGNOSIS — R570 Cardiogenic shock: Secondary | ICD-10-CM

## 2015-05-01 NOTE — Patient Instructions (Signed)
Medication Instructions:  Your physician recommends that you continue on your current medications as directed. Please refer to the Current Medication list given to you today.  Labwork: None ordered  Testing/Procedures: None ordered  Follow-Up: Dr. Ladona Ridgel will talk with Dr. Gala Romney to discuss if ICD generator replacement is needed.  No follow up is needed at this time with Dr. Ladona Ridgel.  He will see you on an as needed basis.   Any Other Special Instructions Will Be Listed Below (If Applicable).  If you need a refill on your cardiac medications before your next appointment, please call your pharmacy.  Thank you for choosing [facility]!!

## 2015-05-01 NOTE — Assessment & Plan Note (Signed)
He has had no sustained ventricular arrhythmias. He will continue his current meds. Real question is whether or not he needs another ICD. I will discuss with Dr. Dorthea Cove.

## 2015-05-01 NOTE — Assessment & Plan Note (Signed)
His device is at EOL. Will discuss whether we should change out his device or not with LVAD in place.

## 2015-05-01 NOTE — Progress Notes (Signed)
HPI [name] returns today after a long absence from our arrhythmia clinic. He is a pleasant 54 yo man with a longstanding h/o chronic systolic heart failure, s/p ICD implant s/p LVAD insertion. He has had a stroke in the interim. He has gained weight and now weighs 200 lbs. He denies any recent ICD shock. He reached EOL approx. 10 months ago.  Allergies  Allergen Reactions  . Ace Inhibitors Cough  . Lexapro [Escitalopram Oxalate] Other (See Comments)    somnolence     Current Outpatient Prescriptions  Medication Sig Dispense Refill  . aspirin 325 MG EC tablet Take 1 tablet (325 mg total) by mouth daily. 30 tablet 6  . atorvastatin (LIPITOR) 40 MG tablet Take 1 tablet (40 mg total) by mouth daily. 30 tablet 6  . carvedilol (COREG) 3.125 MG tablet Take 1 tablet (3.125 mg total) by mouth 2 (two) times daily with a meal. 60 tablet 6  . clopidogrel (PLAVIX) 75 MG tablet Take 1 tablet (75 mg total) by mouth daily with breakfast. 30 tablet 6  . hydrALAZINE (APRESOLINE) 50 MG tablet Take 1 tablet (50 mg total) by mouth 3 (three) times daily. 270 tablet 3  . losartan (COZAAR) 100 MG tablet Take 1 tablet (100 mg total) by mouth daily. 30 tablet 6  . potassium chloride SA (K-DUR,KLOR-CON) 20 MEQ tablet Take 2 tablets (40 mEq total) by mouth 2 (two) times daily. 60 tablet 6  . tamsulosin (FLOMAX) 0.4 MG CAPS capsule Take 1 capsule (0.4 mg total) by mouth daily. 30 capsule 6  . torsemide (DEMADEX) 20 MG tablet Take 1 tablet (20 mg total) by mouth once. 180 tablet 3  . warfarin (COUMADIN) 4 MG tablet TAKE ONE TABLETONCE DAILY OR AS INSTRUCTED FOR INR 2.0 - 3.0 60 tablet 5   No current facility-administered medications for this visit.   Facility-Administered Medications Ordered in Other Visits  Medication Dose Route Frequency Provider Last Rate Last Dose  . sodium chloride 0.9 % injection 10-40 mL  10-40 mL Intracatheter PRN [name], MD         Past Medical History  Diagnosis  Date  . CHF (congestive heart failure) (HCC)     EF- 10-15  . Medically noncompliant   . Mitral regurgitation   . Tobacco user   . HTN (hypertension)   . AICD (automatic cardioverter/defibrillator) present   . GERD (gastroesophageal reflux disease)   . Substance abuse   . Chronic renal insufficiency   . Syncope   . Thrombus 08/06/2010  . SYSTOLIC HEART FAILURE, CHRONIC 09/22/2008    Qualifier: Diagnosis of  By: Gala Romney, MD, Trixie Dredge   . LV (left ventricular) mural thrombus (HCC) 01/28/2011  . ICD - IN SITU 09/16/2008    Qualifier: Diagnosis of  By: Wonda Amis    . MITRAL STENOSIS/ INSUFFICIENCY, NON-RHEUMATIC 09/22/2008    Qualifier: Diagnosis of  By: Gala Romney, MD, Trixie Dredge Hepatomegaly 09/16/2008    Qualifier: Diagnosis of  By: Wonda Amis    . High cholesterol 02/26/2012    "at one time"  . Sleep apnea   . Exertional dyspnea 02/26/2012  . History of blood transfusion 08/2011    "when I had heart pump"  . Migraines   . COMMON MIGRAINE 06/14/2009    Qualifier: Diagnosis of  By: Jonny Ruiz MD, Len Blalock   . History of gout 02/26/2012  . Depression 08/11/2013    Pt denies  .  Bipolar affective disorder (HCC) 10/22/2011    pt denies this hx 02/26/2012    ROS:   All systems reviewed and negative except as noted in the HPI.   Past Surgical History  Procedure Laterality Date  . Cardiac defibrillator placement  ~ 2008  . Left ventricular assist device  08/2011  . Radiology with anesthesia N/A 07/19/2013    Procedure: RADIOLOGY WITH ANESTHESIA;  Surgeon: Oneal Grout, MD;  Location: [facility] OR;  Service: Radiology;  Laterality: N/A;  . Tracheostomy      feinstein  . Right heart catheterization N/A 07/08/2011    Procedure: RIGHT HEART CATH;  Surgeon: Dolores Patty, MD;  Location: [facility] CATH LAB;  Service: Cardiovascular;  Laterality: N/A;  . Right heart catheterization N/A 09/09/2011    Procedure: RIGHT HEART CATH;  Surgeon: Dolores Patty, MD;  Location:  [facility] CATH LAB;  Service: Cardiovascular;  Laterality: N/A;  . Intra-aortic balloon pump insertion  09/09/2011    Procedure: INTRA-AORTIC BALLOON PUMP INSERTION;  Surgeon: Dolores Patty, MD;  Location: [facility] CATH LAB;  Service: Cardiovascular;;  . Left and right heart catheterization with coronary angiogram N/A 07/19/2013    Procedure: LEFT AND RIGHT HEART CATHETERIZATION WITH CORONARY ANGIOGRAM;  Surgeon: Dolores Patty, MD;  Location: [facility] CATH LAB;  Service: Cardiovascular;  Laterality: N/A;     Family History  Problem Relation Age of Onset  . Coronary artery disease Neg Hx      Social History   Social History  . Marital Status: Divorced    Spouse Name: N/A  . Number of Children: N/A  . Years of Education: N/A   Occupational History  . Not on file.   Social History Main Topics  . Smoking status: Former Smoker -- 0.25 packs/day for 33 years    Types: Cigarettes    Quit date: 06/29/2011  . Smokeless tobacco: Former Neurosurgeon    Quit date: 06/29/2011  . Alcohol Use: No  . Drug Use: No     Comment: denies  . Sexual Activity: Not Currently   Other Topics Concern  . Not on file   Social History Narrative     BP 90/0 mmHg  Pulse 83  Ht  (1.6 m)  Wt 200 lb 9.6 oz (90.992 kg)  BMI 35.54 kg/m2  Physical Exam:  Stable appearing 54 yo man, NAD HEENT: Unremarkable Neck:  6 cm JVD, no thyromegally Lymphatics:  No adenopathy Back:  No CVA tenderness Lungs:  Clear with scattered basilar rales HEART: high frequency noise is present. Cannot hear S2. Abd:  soft, positive bowel sounds, no organomegally, no rebound, no guarding Ext:  2 plus pulses, no edema, no cyanosis, no clubbing Skin:  No rashes no nodules Neuro:  CN II through XII intact, motor grossly intact   DEVICE  Normal device function.  See PaceArt for details.   Assess/Plan:

## 2015-05-03 LAB — CUP PACEART INCLINIC DEVICE CHECK
Battery Voltage: 2.3 V
Date Time Interrogation Session: 20161107050000
HIGH POWER IMPEDANCE MEASURED VALUE: 40 Ohm
HighPow Impedance: 35 Ohm
Implantable Lead Model: 158
Lead Channel Impedance Value: 417 Ohm
Lead Channel Pacing Threshold Amplitude: 1.8 V
Lead Channel Pacing Threshold Amplitude: 2.4 V
Lead Channel Pacing Threshold Pulse Width: 0.8 ms
Lead Channel Pacing Threshold Pulse Width: 0.8 ms
Lead Channel Pacing Threshold Pulse Width: 1 ms
Lead Channel Pacing Threshold Pulse Width: 1 ms
Lead Channel Setting Pacing Amplitude: 2.6 V
Lead Channel Setting Pacing Pulse Width: 0.5 ms
MDC IDC LEAD IMPLANT DT: 20060217
MDC IDC LEAD LOCATION: 753860
MDC IDC LEAD SERIAL: 159397
MDC IDC MSMT LEADCHNL RV PACING THRESHOLD AMPLITUDE: 1.8 V
MDC IDC MSMT LEADCHNL RV PACING THRESHOLD AMPLITUDE: 1.8 V
MDC IDC MSMT LEADCHNL RV SENSING INTR AMPL: 1.5 mV
MDC IDC STAT BRADY RV PERCENT PACED: 12 %
Pulse Gen Serial Number: 105117

## 2015-05-05 ENCOUNTER — Encounter (HOSPITAL_COMMUNITY): Payer: Self-pay | Admitting: Infectious Diseases

## 2015-05-08 ENCOUNTER — Other Ambulatory Visit (HOSPITAL_COMMUNITY): Payer: Self-pay | Admitting: *Deleted

## 2015-05-08 DIAGNOSIS — Z7901 Long term (current) use of anticoagulants: Secondary | ICD-10-CM

## 2015-05-08 DIAGNOSIS — Z95811 Presence of heart assist device: Secondary | ICD-10-CM

## 2015-05-08 DIAGNOSIS — R0600 Dyspnea, unspecified: Secondary | ICD-10-CM

## 2015-05-09 ENCOUNTER — Other Ambulatory Visit (HOSPITAL_COMMUNITY): Payer: Self-pay | Admitting: Infectious Diseases

## 2015-05-09 ENCOUNTER — Ambulatory Visit (HOSPITAL_COMMUNITY)
Admission: RE | Admit: 2015-05-09 | Discharge: 2015-05-09 | Disposition: A | Discharge status: Home / Self Care | Payer: Medicare Other | Source: Ambulatory Visit | Attending: Internal Medicine | Admitting: Internal Medicine

## 2015-05-09 ENCOUNTER — Ambulatory Visit: Payer: Self-pay | Admitting: Infectious Diseases

## 2015-05-09 DIAGNOSIS — Z95811 Presence of heart assist device: Secondary | ICD-10-CM | POA: Diagnosis not present

## 2015-05-09 DIAGNOSIS — I5022 Chronic systolic (congestive) heart failure: Secondary | ICD-10-CM

## 2015-05-09 DIAGNOSIS — Z7901 Long term (current) use of anticoagulants: Secondary | ICD-10-CM | POA: Insufficient documentation

## 2015-05-09 DIAGNOSIS — I69354 Hemiplegia and hemiparesis following cerebral infarction affecting left non-dominant side: Secondary | ICD-10-CM | POA: Diagnosis not present

## 2015-05-09 DIAGNOSIS — R0602 Shortness of breath: Secondary | ICD-10-CM

## 2015-05-09 DIAGNOSIS — Z79899 Other long term (current) drug therapy: Secondary | ICD-10-CM | POA: Diagnosis not present

## 2015-05-09 DIAGNOSIS — Z7982 Long term (current) use of aspirin: Secondary | ICD-10-CM | POA: Insufficient documentation

## 2015-05-09 DIAGNOSIS — I5023 Acute on chronic systolic (congestive) heart failure: Secondary | ICD-10-CM

## 2015-05-09 DIAGNOSIS — Z7902 Long term (current) use of antithrombotics/antiplatelets: Secondary | ICD-10-CM | POA: Diagnosis not present

## 2015-05-09 DIAGNOSIS — Z8249 Family history of ischemic heart disease and other diseases of the circulatory system: Secondary | ICD-10-CM | POA: Insufficient documentation

## 2015-05-09 DIAGNOSIS — M109 Gout, unspecified: Secondary | ICD-10-CM | POA: Insufficient documentation

## 2015-05-09 DIAGNOSIS — I130 Hypertensive heart and chronic kidney disease with heart failure and stage 1 through stage 4 chronic kidney disease, or unspecified chronic kidney disease: Secondary | ICD-10-CM | POA: Insufficient documentation

## 2015-05-09 DIAGNOSIS — N189 Chronic kidney disease, unspecified: Secondary | ICD-10-CM | POA: Diagnosis not present

## 2015-05-09 DIAGNOSIS — E7800 Pure hypercholesterolemia, unspecified: Secondary | ICD-10-CM | POA: Insufficient documentation

## 2015-05-09 DIAGNOSIS — R0600 Dyspnea, unspecified: Secondary | ICD-10-CM

## 2015-05-09 DIAGNOSIS — I428 Other cardiomyopathies: Secondary | ICD-10-CM | POA: Diagnosis not present

## 2015-05-09 DIAGNOSIS — K219 Gastro-esophageal reflux disease without esophagitis: Secondary | ICD-10-CM | POA: Diagnosis not present

## 2015-05-09 LAB — COMPREHENSIVE METABOLIC PANEL
ALT: 19 U/L (ref 17–63)
AST: 26 U/L (ref 15–41)
Albumin: 4.3 g/dL (ref 3.5–5.0)
Alkaline Phosphatase: 127 U/L — ABNORMAL HIGH (ref 38–126)
Anion gap: 10 (ref 5–15)
BUN: 13 mg/dL (ref 6–20)
CHLORIDE: 103 mmol/L (ref 101–111)
CO2: 27 mmol/L (ref 22–32)
CREATININE: 1.19 mg/dL (ref 0.61–1.24)
Calcium: 8.8 mg/dL — ABNORMAL LOW (ref 8.9–10.3)
GFR calc Af Amer: >60 mL/min (ref >60)
Glucose, Bld: 103 mg/dL — ABNORMAL HIGH (ref 65–99)
Potassium: 3.1 mmol/L — ABNORMAL LOW (ref 3.5–5.1)
Sodium: 140 mmol/L (ref 135–145)
Total Bilirubin: 2.1 mg/dL — ABNORMAL HIGH (ref 0.3–1.2)
Total Protein: 7.6 g/dL (ref 6.5–8.1)

## 2015-05-09 LAB — PROTIME-INR
INR: 3.37 — ABNORMAL HIGH (ref 0.00–1.49)
Prothrombin Time: 33.4 seconds — ABNORMAL HIGH (ref 11.6–15.2)

## 2015-05-09 LAB — LACTATE DEHYDROGENASE: LDH: 334 U/L — AB (ref 98–192)

## 2015-05-09 MED ORDER — TORSEMIDE 20 MG PO TABS: 40.0000 mg | ORAL_TABLET | Freq: Once | ORAL | Status: DC

## 2015-05-09 MED ORDER — METOLAZONE 2.5 MG PO TABS: 2.5000 mg | ORAL_TABLET | ORAL | Status: DC | PRN

## 2015-05-09 NOTE — Progress Notes (Signed)
ADVANCED HF CLINIC NOTE  Patient ID: [name], male   DOB: 08-Nov-1960, 54 y.o.   MRN: [id number]  HPI: [name] is a 54 yo male with a history of severe CHF, NICM s/p LVAD and TVR (09/2011), VT, NSTEMI, LV thrombus, CKD, GERD, stroke and LVAD hemolysis with LVAD exchange (08/2013).   He was admitted to [facility] January 2015 with NSTEMI and RV failure. He had a difficult course that was complicated by embolic right CVA. He was intubated and eventually had a tracheostomy placed 07/28/13. He was transferred to inpatient rehab for extensive rehab and was making progression, however despite aggressive therapy with heparin, coumadin, ASA and Plavix, his LDH remained persistently elevated in the 950 range (was about 1750 on admit). He did not experience any pump dysfunction, however the case was discussed with Dr Allena Katz and [name] at [facility] and it was felt to transfer him down there for possible pump exchange. He underwent a pump exchange 08/31/13. Post op was complicated by bleeding and he received multiple transfusions. He had AKI and had intermittent HD.  Admitted 5/13-5/18/15 for volume overload and possible hemolysis. LDH 647 and INR 1.69 on admission. Treated with IV lasix and IV heparin. While in the hospital developed some PI events and speed was decreased to 9400. ICD interrogated and showed A flutter and he was placed on IV amiodarone and then transitioned to 200 mg PO BID. He cardioverted to NSR on 5/15. LDH trended down and on discharge was 530 and INR 2.4.  In January 2016 admitted with multiple low flow alarms. Diuretics held and given IVF with improvement. Echo showed severe RV dysfunction  Admitted May 17,2016 from HF.LVAD clinic with volume overload. Diuresed with IV lasix.Multiple low flow alarms particularly in the am felt to be positional. Discharge weight was 175 pounds.    Ramp echo in 7/16, decreased speed to 8600 rpm.   Follow up for Heart Failure and LVAD:  Presents for unscheduled visit due  to worsening edema. Says his weight is up 10-15 pounds. + LE edema and ab distension. Eating whatever he wants. Not watching fluid intake. Taking torsemide 20 mg daily.   Denies driveline trauma, erythema or drainage.  Denies ICD shocks.   Reports taking Coumadin as prescribed and adherence to anticoagulation based dietary restrictions.  Denies bright red blood per rectum or melena, no dark urine or hematuria.   LVAD interrogation reveals:  Speed: 8600 Flow: 3.7 Power: 4.2 PI: 6.8 Alarms: Events: 5 - 15 daily PI events/day Continued Low Flow alarms  **CT of Chest Reports--The position of the inflow cannula is very angulated, with tip directed toward the apical anterior wall. Positional events-->encouraged him to turn to another position when these alarms occur.   Fixed speed: 8600 Low speed limit: 8000  I reviewed the LVAD parameters from today, and compared the results to the patient's prior recorded data. No changes to LVAD speed. The LVAD is functioning within specified parameters.  The patient performs LVAD self-test daily.  LVAD interrogation was negative for any significant power changes, alarms or PI events/speed drops.  LVAD equipment check completed and is in good working order.  Back-up equipment present.   LVAD education done on emergency procedures and precautions and reviewed exit site care.    ECG: NSR, RBBB  SH: Disabled, lives at home with caregiver. No ETOH or smoking  FH: Mother living; HTN        Father living; no health issues    Past Medical History  Diagnosis  Date  . CHF (congestive heart failure) (HCC)     EF- 10-15  . Medically noncompliant   . Mitral regurgitation   . Tobacco user   . HTN (hypertension)   . AICD (automatic cardioverter/defibrillator) present   . GERD (gastroesophageal reflux disease)   . Substance abuse   . Chronic renal insufficiency   . Syncope   . Thrombus 08/06/2010  . SYSTOLIC HEART FAILURE, CHRONIC 09/22/2008     Qualifier: Diagnosis of  By: Gala Romney, MD, Trixie Dredge   . LV (left ventricular) mural thrombus (HCC) 01/28/2011  . ICD - IN SITU 09/16/2008    Qualifier: Diagnosis of  By: Wonda Amis    . MITRAL STENOSIS/ INSUFFICIENCY, NON-RHEUMATIC 09/22/2008    Qualifier: Diagnosis of  By: Gala Romney, MD, Trixie Dredge Hepatomegaly 09/16/2008    Qualifier: Diagnosis of  By: Wonda Amis    . High cholesterol 02/26/2012    "at one time"  . Sleep apnea   . Exertional dyspnea 02/26/2012  . History of blood transfusion 08/2011    "when I had heart pump"  . Migraines   . COMMON MIGRAINE 06/14/2009    Qualifier: Diagnosis of  By: Jonny Ruiz MD, Len Blalock   . History of gout 02/26/2012  . Depression 08/11/2013    Pt denies  . Bipolar affective disorder (HCC) 10/22/2011    pt denies this hx 02/26/2012    Current Outpatient Prescriptions  Medication Sig Dispense Refill  . aspirin 325 MG EC tablet Take 1 tablet (325 mg total) by mouth daily. 30 tablet 6  . atorvastatin (LIPITOR) 40 MG tablet Take 1 tablet (40 mg total) by mouth daily. 30 tablet 6  . carvedilol (COREG) 3.125 MG tablet Take 1 tablet (3.125 mg total) by mouth 2 (two) times daily with a meal. 60 tablet 6  . clopidogrel (PLAVIX) 75 MG tablet Take 1 tablet (75 mg total) by mouth daily with breakfast. 30 tablet 6  . hydrALAZINE (APRESOLINE) 50 MG tablet Take 1 tablet (50 mg total) by mouth 3 (three) times daily. 270 tablet 3  . losartan (COZAAR) 100 MG tablet Take 1 tablet (100 mg total) by mouth daily. 30 tablet 6  . metolazone (ZAROXOLYN) 2.5 MG tablet Take 1 tablet (2.5 mg total) by mouth as needed (When MD tells you to take it). 10 tablet 3  . potassium chloride SA (K-DUR,KLOR-CON) 20 MEQ tablet Take 2 tablets (40 mEq total) by mouth 2 (two) times daily. 60 tablet 6  . tamsulosin (FLOMAX) 0.4 MG CAPS capsule Take 1 capsule (0.4 mg total) by mouth daily. 30 capsule 6  . torsemide (DEMADEX) 20 MG tablet Take 2 tablets (40 mg total) by  mouth once. 180 tablet 3  . warfarin (COUMADIN) 4 MG tablet TAKE ONE TABLETONCE DAILY OR AS INSTRUCTED FOR INR 2.0 - 3.0 60 tablet 5   No current facility-administered medications for this encounter.   Facility-Administered Medications Ordered in Other Encounters  Medication Dose Route Frequency Provider Last Rate Last Dose  . sodium chloride 0.9 % injection 10-40 mL  10-40 mL Intracatheter PRN [name], MD        Ace inhibitors and Lexapro  REVIEW OF SYSTEMS: All systems negative except as listed in HPI, PMH and Problem list.    Vital signs: HR: 92 Doppler MAP: 88 Auto cuff: 105/81 (93) O2 Sat: 96 Wt: 199 lbs  Last wt: 187.2 lbs  Ht: 5'4"  Physical Exam: GENERAL: Well appearing, male  who presents to clinic today HEENT: normal  NECK: Supple, JVP jaw  No lymphadenopathy or thyromegaly appreciated.   CARDIAC:  Mechanical heart sounds with LVAD hum present, prominent heart sounds LUNGS:  Clear to auscultation bilaterally.  ABDOMEN:  Soft, round, mildly distended, positive bowel sounds x4.     LVAD exit site: Drive line out 1 cm with expose velour. No erythema or drainage.  Stabilization device present and accurately applied. Dressing being changed weekly. EXTREMITIES:  Warm and dry, no cyanosis, clubbing, rash,2+ edema. L>R NEUROLOGIC:  Alert and oriented x 4.  L-sided weakness. Gait steady.  No aphasia.  Affect pleasant.      ASSESSMENT AND PLAN:   1) Chronic systolic HF: NICM, s/p LVAD implant (08/2011) and subsequent (08/2013).  Stable NYHA II - Volume status elevated. Will increase torsemide to 40 mg daily and give metolazone for 2 days. If no response will need to be admitted. RTC next week for reassessment and labs.  - Reinforced the need and importance of daily weights, a low sodium diet, and fluid restriction (less than 2 L a day). Instructed to call the HF clinic if weight increases more than 3 lbs overnight or 5 lbs in a week.  2) LVAD, s/p (08/2011) and then  (08/2013) d/t pump thrombosis.  VAD interrogated personally. Continues with low flow alarms likely positional in nature. CT chest showed no kink in outflow graft. - Not a tx candidate due to compliance issues.  3) HTN: Mildly elevated. Should improve with diuresis. 4) CVA: Stable.Continues to have left sided weakness. No longer in therapy. Continue atorvastatin 40 mg daily.  6) h/o Atrial Flutter: now in NSR 7) Anticoagulation management: INR Goal 2.5-3.0. Will check INR today and adjust accordingly. He is on ASA 325 daily as well. 8) RV failure: stable  Arvilla Meres MD  05/09/2015

## 2015-05-09 NOTE — Patient Instructions (Addendum)
1. Take ONE Metolazone 2.5 mg pill today and tomorrow.   2. Increase your Torsemide to 40 mg (TWO pills) every day.   3.   4. Return to clinic Friday to see Dr. Gala Romney  5. Monday night November 21st at 5 pm in the [facility] support group meeting.

## 2015-05-09 NOTE — Progress Notes (Signed)
. Symptom  Yes  No  Details   Angina         x Activity:   Claudication         x How far:    Syncope         x When:    Stroke        x  Jan 2015; Left sided residual weakness   Orthopnea         x How many pillows: one; sleeps flat   PND               x How often:   CPAP      N/A How many hrs:   Pedal edema        x         peripheral edema daily for about a month  Abd fullness        x         about a month  N&V         x Good appetite; eating full meal   Diaphoresis         x When:  Bleeding        x   Urine color    light yellow  SOB        x        Activity: Incline; more fatigue in legs than SOB  Palpitations         x When:  ICD shock         x   Hospitlizaitons                x When/where/why:    ED visit         x When/where/why:  Other MD         x When/who/why:  Activity           Working 7 hours five days week  Fluid           No limitations   Diet     No limitations   Vital signs: HR:  90 Doppler MAP: 98 Auto cuff: 101/69 (84) O2 Sat: 96% Wt: 190.6 lbs    Last wt: 199 lbs  Ht: 5'4"  LVAD interrogation reveals:  Speed:  8600 Flow: 3.7 Power: 4.2 PI: 6.8 Alarms: Events: 1 - 25 PI events/day  LOW FLOWS:  04/30/15--1  05/02/15--9  05/06/15--1   **CT of Chest Reports--The position of the inflow cannula is very angulated, with tip directed toward the apical anterior wall. Positional events-->encouraged him to turn to another position when these alarms occur.    Fixed speed:  8600 Low speed limit: 8000  Primary Controller:  Replace back up battery in 20months (03/2016)  [id number]; version V7.23 Back up controller:   Replace back up battery in  20 months (08/2015)  [id number]; version V7.23  Back up controller not present today  LVAD Exit Site: VAD dressing dry and intact with attachment device accurately applied and driveline secured. Approximately 1 - 2 cm of velour remains outside of exit site with old bloody drainage noted.  Pt denies fever or chills.  Driveline dressing is being changed weekly by his caregiver and he reports to have adequate supplies at home.   Clinic Encounter:  Presents to clinic today for acute visit for peripheral swelling. No complaints aside Improved Low flow alarms as noted as above. Stable PI events from previous visit. Reports that he feels good and is sleeping good but  wants to be admitted.    MAP 84 on current regimen with auto cuff picking up today. Fluid overloaded 2-3+ edema peripherally L>R, abdominal bloating and elevated JVP per Dr. Prescott Gum exam. Dr. Gala Romney increased Torsemide to 40 mg QD. Currently prescribed KCl 2 tabs BID, however only taking 1 QD (Verified by [place]). Instructed to increase to prescribed regimen and take extra 40 mEq today. He informed me he "will not be taking 4 pills a day because they make him choke".   LDH 319 --> stable for baseline (has previously had pump thrombus and exchange).   INR 3.37 today --> difficult to manage with history of drinking occasionally and self-titrating medications. INR goal 2.5 - 3.0 on FD ASA and plavix. (previous thrombus w/ pump exchange, CVA x 2 and lower speed for RV failure). See anticoag note.   RCT Friday to reassess fluid status.   Rexene Alberts, RN, BSN, CCRN  VAD Coordinator   Office: [phone] 24/7 VAD Pager: [phone]

## 2015-05-11 ENCOUNTER — Encounter (HOSPITAL_COMMUNITY): Payer: Medicare Other

## 2015-05-12 ENCOUNTER — Ambulatory Visit (HOSPITAL_COMMUNITY)
Admission: RE | Admit: 2015-05-12 | Discharge: 2015-05-12 | Disposition: A | Discharge status: Home / Self Care | Payer: Medicare Other | Source: Ambulatory Visit | Attending: Cardiology | Admitting: Cardiology

## 2015-05-12 ENCOUNTER — Other Ambulatory Visit (HOSPITAL_COMMUNITY): Payer: Self-pay | Admitting: Infectious Diseases

## 2015-05-12 DIAGNOSIS — Z79899 Other long term (current) drug therapy: Secondary | ICD-10-CM | POA: Diagnosis not present

## 2015-05-12 DIAGNOSIS — E876 Hypokalemia: Secondary | ICD-10-CM | POA: Diagnosis not present

## 2015-05-12 DIAGNOSIS — Z95811 Presence of heart assist device: Secondary | ICD-10-CM

## 2015-05-12 DIAGNOSIS — I5022 Chronic systolic (congestive) heart failure: Secondary | ICD-10-CM | POA: Insufficient documentation

## 2015-05-12 DIAGNOSIS — Z8673 Personal history of transient ischemic attack (TIA), and cerebral infarction without residual deficits: Secondary | ICD-10-CM | POA: Diagnosis not present

## 2015-05-12 DIAGNOSIS — Z7982 Long term (current) use of aspirin: Secondary | ICD-10-CM | POA: Diagnosis not present

## 2015-05-12 DIAGNOSIS — I5023 Acute on chronic systolic (congestive) heart failure: Secondary | ICD-10-CM | POA: Diagnosis not present

## 2015-05-12 DIAGNOSIS — I129 Hypertensive chronic kidney disease with stage 1 through stage 4 chronic kidney disease, or unspecified chronic kidney disease: Secondary | ICD-10-CM | POA: Diagnosis not present

## 2015-05-12 DIAGNOSIS — I429 Cardiomyopathy, unspecified: Secondary | ICD-10-CM | POA: Diagnosis present

## 2015-05-12 DIAGNOSIS — I4892 Unspecified atrial flutter: Secondary | ICD-10-CM | POA: Insufficient documentation

## 2015-05-12 DIAGNOSIS — K219 Gastro-esophageal reflux disease without esophagitis: Secondary | ICD-10-CM | POA: Diagnosis not present

## 2015-05-12 LAB — BASIC METABOLIC PANEL
ANION GAP: 10 (ref 5–15)
BUN: 18 mg/dL (ref 6–20)
CALCIUM: 9.7 mg/dL (ref 8.9–10.3)
CHLORIDE: 97 mmol/L — AB (ref 101–111)
CO2: 31 mmol/L (ref 22–32)
Creatinine, Ser: 1.25 mg/dL — ABNORMAL HIGH (ref 0.61–1.24)
GFR calc Af Amer: >60 mL/min (ref >60)
GFR calc non Af Amer: >60 mL/min (ref >60)
GLUCOSE: 105 mg/dL — AB (ref 65–99)
Potassium: 3.3 mmol/L — ABNORMAL LOW (ref 3.5–5.1)
Sodium: 138 mmol/L (ref 135–145)

## 2015-05-12 NOTE — Progress Notes (Signed)
. Symptom  Yes  No  Details   Angina         x Activity:   Claudication         x How far:    Syncope         x When:    Stroke        x  Jan 2015; Left sided residual weakness   Orthopnea         x How many pillows: one; sleeps flat   PND               x How often:   CPAP      N/A How many hrs:   Pedal edema        x         peripheral edema daily for about a month  Abd fullness        x         about a month  N&V         x Good appetite; eating full meal   Diaphoresis         x When:  Bleeding        x   Urine color    light yellow  SOB        x        Activity: Incline; more fatigue in legs than SOB  Palpitations         x When:  ICD shock         x   Hospitlizaitons                x When/where/why:    ED visit         x When/where/why:  Other MD         x When/who/why:  Activity           Working 7 hours five days week  Fluid           No limitations   Diet     No limitations   Vital signs: HR:  90 Doppler MAP: 98 Auto cuff: 101/69 (84) O2 Sat: 96% Wt: 186.4 lbs    Last wt: 190.6 lbs   Ht: 5'4"  LVAD interrogation reveals:  Speed:  8600 Flow: 3.7 Power: 4.2 PI: 6.8 Alarms: Events: 1 - 25 PI events/day  LOW FLOWS: none since last visit.   **2016 CT of Chest Reports--The position of the inflow cannula is very angulated, with tip directed toward the apical anterior wall. Positional events-->encouraged him to turn to another position when these alarms occur.    Fixed speed:  8600 Low speed limit: 8000  Primary Controller:  Replace back up battery in 20months (03/2016)  [id number]; version V7.23 Back up controller:   Replace back up battery in  20 months (08/2015)  [id number]; version V7.23  Back up controller not present today  LVAD Exit Site: VAD dressing dry and intact with attachment device accurately applied and driveline secured. Approximately 1 - 2 cm of velour remains outside of exit site with old bloody drainage noted.  Pt denies fever or chills. Driveline  dressing is being changed weekly by his caregiver and he reports to have adequate supplies at home.   Clinic Encounter:  Presents to clinic today for follow up OV after two doses of Metolazone and an increase in torsemide regimen. Accompanied by his mother. Peripheral swelling improved significantly. Still present however ankle is much smaller.   40  mEq KCl x i given in clinic today. Reinforced need to take KCl as prescribed (40 mEq BID)  RCT in 3 weeks.   Rexene Alberts, RN, BSN, CCRN  VAD Coordinator   Office: [phone] 24/7 VAD Pager: [phone]

## 2015-05-12 NOTE — Patient Instructions (Signed)
1. Take TWO torsemide pills (40 mg) every day.   2. Take TWO potassium pills TWICE a day.   3. Lab work on 05/24/15 at [facility].   4. Return to see Dr. Gala Romney in 3 weeks.

## 2015-05-14 NOTE — Progress Notes (Signed)
Patient ID: [name], male   DOB: 21-Jan-1961, 54 y.o.   MRN: [id number]  ADVANCED HF CLINIC NOTE  Patient ID: [name], male   DOB: 1961/01/20, 54 y.o.   MRN: [id number]  HPI: [name] is a 54 yo male with a history of severe CHF, NICM s/p LVAD and TVR (09/2011), VT, NSTEMI, LV thrombus, CKD, GERD, stroke and LVAD hemolysis with LVAD exchange (08/2013).   He was admitted to [name] January 2015 with NSTEMI and RV failure. He had a difficult course that was complicated by embolic right CVA. He was intubated and eventually had a tracheostomy placed 07/28/13. He was transferred to inpatient rehab for extensive rehab and was making progression, however despite aggressive therapy with heparin, coumadin, ASA and Plavix, his LDH remained persistently elevated in the 950 range (was about 1750 on admit). He did not experience any pump dysfunction, however the case was discussed with Dr Allena Katz and [name] at [facility] and it was felt to transfer him down there for possible pump exchange. He underwent a pump exchange 08/31/13. Post op was complicated by bleeding and he received multiple transfusions. He had AKI and had intermittent HD.  Admitted 5/13-5/18/15 for volume overload and possible hemolysis. LDH 647 and INR 1.69 on admission. Treated with IV lasix and IV heparin. While in the hospital developed some PI events and speed was decreased to 9400. ICD interrogated and showed A flutter and he was placed on IV amiodarone and then transitioned to 200 mg PO BID. He cardioverted to NSR on 5/15. LDH trended down and on discharge was 530 and INR 2.4.  In January 2016 admitted with multiple low flow alarms. Diuretics held and given IVF with improvement. Echo showed severe RV dysfunction  Admitted May 17,2016 from HF.LVAD clinic with volume overload. Diuresed with IV lasix.Multiple low flow alarms particularly in the am felt to be positional. Discharge weight was 175 pounds.    Ramp echo in 7/16, decreased speed to 8600 rpm.    Follow up for Heart Failure and LVAD:  Presents for f/u visit. Seen last week for volume overload in setting of dietary indiscretion. Torsemide increased to 40 daily and given 2 doses of metolazone. Weight down alomost 5 pounds. Swelling and ab distension better but not completely resolved. Denies dyspnea. Working full time at [employer].   Denies driveline trauma, erythema or drainage.  Denies ICD shocks.   Reports taking Coumadin as prescribed and adherence to anticoagulation based dietary restrictions.  Denies bright red blood per rectum or melena, no dark urine or hematuria.   LVAD interrogation reveals:  Speed: 8600  Flow: 3.7  Power: 4.2  PI: 6.8  Alarms: Events: 1 - 25 PI events/day  LOW FLOWS: none since last visit.   **2016 CT of Chest Reports--The position of the inflow cannula is very angulated, with tip directed toward the apical anterior wall. Positional events-->encouraged him to turn to another position when these alarms occur.   Fixed speed: 8600  Low speed limit: 8000  I reviewed the LVAD parameters from today, and compared the results to the patient's prior recorded data. No changes to LVAD speed. The LVAD is functioning within specified parameters.  The patient performs LVAD self-test daily.  LVAD interrogation was negative for any significant power changes, alarms or PI events/speed drops.  LVAD equipment check completed and is in good working order.  Back-up equipment present.   LVAD education done on emergency procedures and precautions and reviewed exit site care.    ECG: NSR,  RBBB  SH: Disabled, lives at home with caregiver. No ETOH or smoking  FH: Mother living; HTN        Father living; no health issues    Past Medical History  Diagnosis Date  . CHF (congestive heart failure) (HCC)     EF- 10-15  . Medically noncompliant   . Mitral regurgitation   . Tobacco user   . HTN (hypertension)   . AICD (automatic cardioverter/defibrillator) present   . GERD  (gastroesophageal reflux disease)   . Substance abuse   . Chronic renal insufficiency   . Syncope   . Thrombus 08/06/2010  . SYSTOLIC HEART FAILURE, CHRONIC 09/22/2008    Qualifier: Diagnosis of  By: Gala Romney, MD, Trixie Dredge   . LV (left ventricular) mural thrombus (HCC) 01/28/2011  . ICD - IN SITU 09/16/2008    Qualifier: Diagnosis of  By: Wonda Amis    . MITRAL STENOSIS/ INSUFFICIENCY, NON-RHEUMATIC 09/22/2008    Qualifier: Diagnosis of  By: Gala Romney, MD, Trixie Dredge Hepatomegaly 09/16/2008    Qualifier: Diagnosis of  By: Wonda Amis    . High cholesterol 02/26/2012    "at one time"  . Sleep apnea   . Exertional dyspnea 02/26/2012  . History of blood transfusion 08/2011    "when I had heart pump"  . Migraines   . COMMON MIGRAINE 06/14/2009    Qualifier: Diagnosis of  By: Jonny Ruiz MD, Len Blalock   . History of gout 02/26/2012  . Depression 08/11/2013    Pt denies  . Bipolar affective disorder (HCC) 10/22/2011    pt denies this hx 02/26/2012    Current Outpatient Prescriptions  Medication Sig Dispense Refill  . aspirin 325 MG EC tablet Take 1 tablet (325 mg total) by mouth daily. 30 tablet 6  . atorvastatin (LIPITOR) 40 MG tablet Take 1 tablet (40 mg total) by mouth daily. 30 tablet 6  . carvedilol (COREG) 3.125 MG tablet Take 1 tablet (3.125 mg total) by mouth 2 (two) times daily with a meal. 60 tablet 6  . clopidogrel (PLAVIX) 75 MG tablet Take 1 tablet (75 mg total) by mouth daily with breakfast. 30 tablet 6  . hydrALAZINE (APRESOLINE) 50 MG tablet Take 1 tablet (50 mg total) by mouth 3 (three) times daily. 270 tablet 3  . losartan (COZAAR) 100 MG tablet Take 1 tablet (100 mg total) by mouth daily. 30 tablet 6  . metolazone (ZAROXOLYN) 2.5 MG tablet Take 1 tablet (2.5 mg total) by mouth as needed (When MD tells you to take it). 10 tablet 3  . potassium chloride SA (K-DUR,KLOR-CON) 20 MEQ tablet Take 2 tablets (40 mEq total) by mouth 2 (two) times daily. 60 tablet 6    . tamsulosin (FLOMAX) 0.4 MG CAPS capsule Take 1 capsule (0.4 mg total) by mouth daily. 30 capsule 6  . torsemide (DEMADEX) 20 MG tablet Take 2 tablets (40 mg total) by mouth once. 180 tablet 3  . warfarin (COUMADIN) 4 MG tablet TAKE ONE TABLETONCE DAILY OR AS INSTRUCTED FOR INR 2.0 - 3.0 60 tablet 5   No current facility-administered medications for this encounter.   Facility-Administered Medications Ordered in Other Encounters  Medication Dose Route Frequency Provider Last Rate Last Dose  . sodium chloride 0.9 % injection 10-40 mL  10-40 mL Intracatheter PRN [name], MD        Ace inhibitors and Lexapro  REVIEW OF SYSTEMS: All systems negative except as listed in HPI,  PMH and Problem list.   Vital signs:  HR: 90  Doppler MAP: 98  Auto cuff: 101/69 (84)  O2 Sat: 96%  Wt: 186.4 lbs  Last wt: 190.6 lbs  Ht: 5'4"    Physical Exam: GENERAL: Well appearing, male who presents to clinic today HEENT: normal  NECK: Supple, JVP 9-10 No lymphadenopathy or thyromegaly appreciated.   CARDIAC:  Mechanical heart sounds with LVAD hum present, prominent heart sounds LUNGS:  Clear to auscultation bilaterally.  ABDOMEN:  Soft, round, mildly distended, positive bowel sounds x4.     LVAD exit site: Drive line out 1 cm with expose velour. No erythema or drainage.  Stabilization device present and accurately applied. Dressing being changed weekly. EXTREMITIES:  Warm and dry, no cyanosis, clubbing, rash, 1+ edema. L>R NEUROLOGIC:  Alert and oriented x 4.  L-sided weakness. Gait steady.  No aphasia.  Affect pleasant.      ASSESSMENT AND PLAN:   1) Chronic systolic HF: NICM, s/p LVAD implant (08/2011) and subsequent (08/2013).  Stable NYHA II - Volume status improved after 2 doses of metolazone and increasing torsemide to 40 mg daily  - Keep torsemide at  daily. Can down titrate as needed. Use metolazone prn. Stressed ned to take extra kcl with this. We gave extra 40 kcl in clinic.  -  Reinforced the need and importance of daily weights, a low sodium diet, and fluid restriction (less than 2 L a day). Instructed to call the HF clinic if weight increases more than 3 lbs overnight or 5 lbs in a week.  2) LVAD, s/p (08/2011) and then (08/2013) d/t pump thrombosis.  VAD interrogated personally. Recently with low flow alarms likely positional in nature. CT chest showed no kink in outflow graft. - Not a tx candidate due to compliance issues.  3) HTN: Improved with diuresis. 4) CVA: Stable.Continues to have left sided weakness. No longer in therapy. Continue atorvastatin 40 mg daily.  6) h/o Atrial Flutter: now in NSR 7) Anticoagulation management: INR Goal 2.5-3.0. Will check INR today and adjust accordingly. He is on ASA 325 daily as well. 8) RV failure: stable 9) LLE edema - due to venous stasis as result of CVA. Has trouble with compression stocking, Will get stocking donner for him.  Arvilla Meres MD  05/14/2015

## 2015-05-23 ENCOUNTER — Telehealth: Payer: Self-pay | Admitting: Internal Medicine

## 2015-05-23 ENCOUNTER — Ambulatory Visit (HOSPITAL_COMMUNITY): Payer: Self-pay | Admitting: *Deleted

## 2015-05-23 ENCOUNTER — Other Ambulatory Visit (HOSPITAL_COMMUNITY): Payer: Self-pay | Admitting: *Deleted

## 2015-05-23 ENCOUNTER — Other Ambulatory Visit (INDEPENDENT_AMBULATORY_CARE_PROVIDER_SITE_OTHER): Payer: Medicare Other

## 2015-05-23 DIAGNOSIS — I059 Rheumatic mitral valve disease, unspecified: Secondary | ICD-10-CM

## 2015-05-23 DIAGNOSIS — Z7901 Long term (current) use of anticoagulants: Secondary | ICD-10-CM

## 2015-05-23 DIAGNOSIS — Z95811 Presence of heart assist device: Secondary | ICD-10-CM

## 2015-05-23 LAB — PROTIME-INR
INR: 1.91 — AB (ref <1.50)
Prothrombin Time: 22.1 seconds — ABNORMAL HIGH (ref 11.6–15.2)

## 2015-05-23 NOTE — Telephone Encounter (Signed)
Received a call from Addison at [facility] with a stat result ordered by Dr. Gala Romney. Pt's PT 22.1 and INR 1.91. Called CHF clinic and spoke with Shantell and gave her these results.

## 2015-06-06 ENCOUNTER — Ambulatory Visit (HOSPITAL_COMMUNITY)
Admission: RE | Admit: 2015-06-06 | Discharge: 2015-06-06 | Disposition: A | Discharge status: Home / Self Care | Payer: Medicare Other | Source: Ambulatory Visit | Attending: Internal Medicine | Admitting: Internal Medicine

## 2015-06-06 ENCOUNTER — Other Ambulatory Visit (HOSPITAL_COMMUNITY): Payer: Self-pay | Admitting: *Deleted

## 2015-06-06 ENCOUNTER — Encounter (HOSPITAL_COMMUNITY): Payer: Self-pay | Admitting: Infectious Diseases

## 2015-06-06 ENCOUNTER — Encounter (HOSPITAL_COMMUNITY): Payer: Self-pay

## 2015-06-06 ENCOUNTER — Ambulatory Visit (HOSPITAL_COMMUNITY): Payer: Self-pay | Admitting: Infectious Diseases

## 2015-06-06 VITALS — BP 82/0 | HR 84 | Ht 64.0 in | Wt 187.0 lb

## 2015-06-06 DIAGNOSIS — I10 Essential (primary) hypertension: Secondary | ICD-10-CM

## 2015-06-06 DIAGNOSIS — I252 Old myocardial infarction: Secondary | ICD-10-CM | POA: Insufficient documentation

## 2015-06-06 DIAGNOSIS — Z7982 Long term (current) use of aspirin: Secondary | ICD-10-CM | POA: Diagnosis not present

## 2015-06-06 DIAGNOSIS — Z79899 Other long term (current) drug therapy: Secondary | ICD-10-CM | POA: Diagnosis not present

## 2015-06-06 DIAGNOSIS — R0600 Dyspnea, unspecified: Secondary | ICD-10-CM

## 2015-06-06 DIAGNOSIS — I110 Hypertensive heart disease with heart failure: Secondary | ICD-10-CM | POA: Diagnosis not present

## 2015-06-06 DIAGNOSIS — Z95811 Presence of heart assist device: Secondary | ICD-10-CM | POA: Diagnosis not present

## 2015-06-06 DIAGNOSIS — I69354 Hemiplegia and hemiparesis following cerebral infarction affecting left non-dominant side: Secondary | ICD-10-CM | POA: Insufficient documentation

## 2015-06-06 DIAGNOSIS — Z7901 Long term (current) use of anticoagulants: Secondary | ICD-10-CM

## 2015-06-06 DIAGNOSIS — I5022 Chronic systolic (congestive) heart failure: Secondary | ICD-10-CM

## 2015-06-06 DIAGNOSIS — I428 Other cardiomyopathies: Secondary | ICD-10-CM | POA: Insufficient documentation

## 2015-06-06 DIAGNOSIS — I878 Other specified disorders of veins: Secondary | ICD-10-CM | POA: Insufficient documentation

## 2015-06-06 DIAGNOSIS — Z7902 Long term (current) use of antithrombotics/antiplatelets: Secondary | ICD-10-CM | POA: Diagnosis not present

## 2015-06-06 DIAGNOSIS — K219 Gastro-esophageal reflux disease without esophagitis: Secondary | ICD-10-CM | POA: Insufficient documentation

## 2015-06-06 LAB — BASIC METABOLIC PANEL
ANION GAP: 8 (ref 5–15)
BUN: 15 mg/dL (ref 6–20)
CALCIUM: 9.2 mg/dL (ref 8.9–10.3)
CO2: 25 mmol/L (ref 22–32)
Chloride: 104 mmol/L (ref 101–111)
Creatinine, Ser: 1.1 mg/dL (ref 0.61–1.24)
Glucose, Bld: 99 mg/dL (ref 65–99)
POTASSIUM: 3.8 mmol/L (ref 3.5–5.1)
SODIUM: 137 mmol/L (ref 135–145)

## 2015-06-06 LAB — PROTIME-INR
INR: 2.82 — ABNORMAL HIGH (ref 0.00–1.49)
Prothrombin Time: 29.2 seconds — ABNORMAL HIGH (ref 11.6–15.2)

## 2015-06-06 LAB — CBC
HEMATOCRIT: 41.8 % (ref 39.0–52.0)
HEMOGLOBIN: 13.7 g/dL (ref 13.0–17.0)
MCH: 29 pg (ref 26.0–34.0)
MCHC: 32.8 g/dL (ref 30.0–36.0)
MCV: 88.6 fL (ref 78.0–100.0)
Platelets: 203 10*3/uL (ref 150–400)
RBC: 4.72 MIL/uL (ref 4.22–5.81)
RDW: 14.5 % (ref 11.5–15.5)
WBC: 3.2 10*3/uL — AB (ref 4.0–10.5)

## 2015-06-06 LAB — BRAIN NATRIURETIC PEPTIDE: B Natriuretic Peptide: 168.6 pg/mL — ABNORMAL HIGH (ref 0.0–100.0)

## 2015-06-06 LAB — LACTATE DEHYDROGENASE: LDH: 301 U/L — AB (ref 98–192)

## 2015-06-06 NOTE — Progress Notes (Signed)
A user error has taken place: encounter opened in error, closed for administrative reasons.

## 2015-06-06 NOTE — Patient Instructions (Signed)
1. No medication changes today. Fluid looks good.   2. We will call you with the rest of your blood work.   3. Come back to see [place] in 2 months. Call if you need [place] sooner.

## 2015-06-06 NOTE — Progress Notes (Signed)
. Symptom  Yes  No  Details   Angina         x Activity:   Claudication         x How far:    Syncope         x When:    Stroke        x  Jan 2015; Left sided residual weakness   Orthopnea         x How many pillows: one; sleeps flat   PND               x How often:   CPAP      N/A How many hrs:   Pedal edema        x        Occasionally but improved   Abd fullness        x        Occasionally but improved  N&V         x Good appetite; eating full meal   Diaphoresis         x When:  Bleeding        x   Urine color    light yellow  SOB        x        Activity: Incline; more fatigue in legs than SOB  Palpitations         x When:  ICD shock         x   Hospitlizaitons                x When/where/why:    ED visit         x When/where/why:  Other MD         x When/who/why:  Activity           Unemployed and not much activity   Fluid           No limitations   Diet     No limitations, eating less salt    Vital signs: HR:  85 Doppler MAP: 82 Auto cuff: not picking up  O2 Sat: 96% Wt: 187 lbs    Last wt: 186.4 lbs     Ht: 5'4"  LVAD interrogation reveals:  Speed:  8600 Flow: 3.6 Power: 4.3w PI: 5.3  Alarms: Events: 5 - 25 PI events/day with outliers on 12/11 and 12/123 30 - 64 LOW FLOWS: only 2 since 05/30/15 on interrogation   **2016 CT of Chest Reports--The position of the inflow cannula is very angulated, with tip directed toward the apical anterior wall. Positional events-->encouraged him to turn to another position when these alarms occur.    Fixed speed:  8600 Low speed limit: 8000  Primary Controller:  Replace back up battery in 20 months (03/2016)  [id number]; version V7.23 Back up controller:   Replace back up battery in  20 months (08/2015)  [id number]; version V7.23  Back up controller not present today.  LVAD Exit Site: VAD dressing dry and intact with attachment device accurately applied and driveline secured. Approximately 1 - 2 cm of velour remains outside  of exit site with old bloody drainage noted.  Pt denies fever or chills. Driveline dressing is being changed weekly given 4 dressings to ensure he has enough before next clinic visit.    Clinic Encounter:  Presents to clinic today for follow up OV after two doses of Metolazone and an increase in torsemide regimen. Accompanied by  his mother. Peripheral swelling improved significantly. Still present however ankle is much smaller. Taking only 20 mg torsemide and not 40 mg as prescribed and only 20 mEq KCl daily but fluid status looks good today and peripheral edema much improved. Reports he hasn't eaten as much microwave foods lately and more prepared foods from his mother's house.   INR 2.82 today-- goal 2.5 - 3.0 with FD ASA and Plavix 75 mg. See anticoag note.   LDH 301 today which is within established baseline of 300 - 350.   MAP 82 on current regimen.   RTC in 2 months. Advised to call sooner if he notices his weight/peripheral swelling increasing.   Rexene Alberts, RN, BSN, CCRN  VAD Coordinator   Office: [phone] 24/7 VAD Pager: [phone]

## 2015-06-13 ENCOUNTER — Other Ambulatory Visit (HOSPITAL_COMMUNITY): Payer: Self-pay | Admitting: *Deleted

## 2015-06-13 DIAGNOSIS — I6312 Cerebral infarction due to embolism of basilar artery: Secondary | ICD-10-CM

## 2015-06-13 DIAGNOSIS — I5023 Acute on chronic systolic (congestive) heart failure: Secondary | ICD-10-CM

## 2015-06-13 DIAGNOSIS — I471 Supraventricular tachycardia: Secondary | ICD-10-CM

## 2015-06-13 DIAGNOSIS — Z95811 Presence of heart assist device: Secondary | ICD-10-CM

## 2015-06-13 DIAGNOSIS — I5022 Chronic systolic (congestive) heart failure: Secondary | ICD-10-CM

## 2015-06-13 DIAGNOSIS — I10 Essential (primary) hypertension: Secondary | ICD-10-CM

## 2015-06-13 MED ORDER — TAMSULOSIN HCL 0.4 MG PO CAPS: 0.4000 mg | ORAL_CAPSULE | Freq: Every day | ORAL | Status: DC

## 2015-06-13 MED ORDER — LOSARTAN POTASSIUM 100 MG PO TABS: 100.0000 mg | ORAL_TABLET | Freq: Every day | ORAL | Status: DC

## 2015-06-13 MED ORDER — ATORVASTATIN CALCIUM 40 MG PO TABS: 40.0000 mg | ORAL_TABLET | Freq: Every day | ORAL | Status: DC

## 2015-06-13 MED ORDER — CARVEDILOL 3.125 MG PO TABS: 3.1250 mg | ORAL_TABLET | Freq: Two times a day (BID) | ORAL | Status: DC

## 2015-06-13 MED ORDER — CLOPIDOGREL BISULFATE 75 MG PO TABS: 75.0000 mg | ORAL_TABLET | Freq: Every day | ORAL | Status: DC

## 2015-06-17 NOTE — Progress Notes (Signed)
Patient ID: [name], male   DOB: 1961/04/19, 54 y.o.   MRN: [id number] Patient ID: [name], male   DOB: 11-19-1960, 54 y.o.   MRN: [id number]  ADVANCED HF CLINIC NOTE  Patient ID: [name], male   DOB: 1961/01/28, 54 y.o.   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   He was admitted to [facility] January 2015 with NSTEMI and RV failure. He had a difficult course that was complicated by embolic right CVA. He was intubated and eventually had a tracheostomy placed 07/28/13. He was transferred to inpatient rehab for extensive rehab and was making progression, however despite aggressive therapy with heparin, coumadin, ASA and Plavix, his LDH remained persistently elevated in the 950 range (was about 1750 on admit). He did not experience any pump dysfunction, however the case was discussed with Dr Allena Katz and [name] at [facility] and it was felt to transfer him down there for possible pump exchange. He underwent a pump exchange 08/31/13. Post op was complicated by bleeding and he received multiple transfusions. He had AKI and had intermittent HD.  Admitted 5/13-5/18/15 for volume overload and possible hemolysis. LDH 647 and INR 1.69 on admission. Treated with IV lasix and IV heparin. While in the hospital developed some PI events and speed was decreased to 9400. ICD interrogated and showed A flutter and he was placed on IV amiodarone and then transitioned to 200 mg PO BID. He cardioverted to NSR on 5/15. LDH trended down and on discharge was 530 and INR 2.4.  In January 2016 admitted with multiple low flow alarms. Diuretics held and given IVF with improvement. Echo showed severe RV dysfunction  Admitted May 17,2016 from HF.LVAD clinic with volume overload. Diuresed with IV lasix.Multiple low flow alarms particularly in the am felt to be positional. Discharge weight  was 175 pounds.    Ramp echo in 7/16, decreased speed to 8600 rpm.   Follow up for Heart Failure and LVAD:  Presents for f/u visit. Seen recently for volume overload in setting of dietary indiscretion. Torsemide increased to 40 daily and given 2 doses of metolazone. Edema much improved despite the fact that he is only taking torsemide 20 daily. Says he is eating less microwave food. No orthopnea or PND. No bleeding. Says he is compliant with meds.  Working full time at [employer].   Denies driveline trauma, erythema or drainage.  Denies ICD shocks.   Reports taking Coumadin as prescribed and adherence to anticoagulation based dietary restrictions.  Denies bright red blood per rectum or melena, no dark urine or hematuria.   LVAD interrogation reveals:  Speed: 8600  Flow: 3.6  Power: 4.3w  PI: 5.3  Alarms: Events: 5 - 25 PI events/day with outliers on 12/11 and 12/123 30 - 64  LOW FLOWS: only 2 since 05/30/15 on interrogation    I reviewed the LVAD parameters from today, and compared the results to the patient's prior recorded data. No changes to LVAD speed. The LVAD is functioning within specified parameters.  The patient performs LVAD self-test daily.  LVAD interrogation was negative for any significant power changes, alarms or PI events/speed drops.  LVAD equipment check completed and is in good working order.  Back-up equipment present.   LVAD education done on emergency procedures and precautions and reviewed exit site care.   SH: Disabled, lives  at home with caregiver. No ETOH or smoking  FH: Mother living; HTN        Father living; no health issues    Past Medical History  Diagnosis Date  . CHF (congestive heart failure) (HCC)     EF- 10-15  . Medically noncompliant   . Mitral regurgitation   . Tobacco user   . HTN (hypertension)   . AICD (automatic cardioverter/defibrillator) present   . GERD (gastroesophageal reflux disease)   . Substance abuse   . Chronic renal  insufficiency   . Syncope   . Thrombus 08/06/2010  . SYSTOLIC HEART FAILURE, CHRONIC 09/22/2008    Qualifier: Diagnosis of  By: Gala Romney, MD, Trixie Dredge   . LV (left ventricular) mural thrombus (HCC) 01/28/2011  . ICD - IN SITU 09/16/2008    Qualifier: Diagnosis of  By: Wonda Amis    . MITRAL STENOSIS/ INSUFFICIENCY, NON-RHEUMATIC 09/22/2008    Qualifier: Diagnosis of  By: Gala Romney, MD, Trixie Dredge Hepatomegaly 09/16/2008    Qualifier: Diagnosis of  By: Wonda Amis    . High cholesterol 02/26/2012    "at one time"  . Sleep apnea   . Exertional dyspnea 02/26/2012  . History of blood transfusion 08/2011    "when I had heart pump"  . Migraines   . COMMON MIGRAINE 06/14/2009    Qualifier: Diagnosis of  By: Jonny Ruiz MD, Len Blalock   . History of gout 02/26/2012  . Depression 08/11/2013    Pt denies  . Bipolar affective disorder (HCC) 10/22/2011    pt denies this hx 02/26/2012    Current Outpatient Prescriptions  Medication Sig Dispense Refill  . aspirin 325 MG EC tablet Take 1 tablet (325 mg total) by mouth daily. 30 tablet 6  . hydrALAZINE (APRESOLINE) 50 MG tablet Take 1 tablet (50 mg total) by mouth 3 (three) times daily. 270 tablet 3  . metolazone (ZAROXOLYN) 2.5 MG tablet Take 1 tablet (2.5 mg total) by mouth as needed (When MD tells you to take it). 10 tablet 3  . potassium chloride SA (K-DUR,KLOR-CON) 20 MEQ tablet Take 2 tablets (40 mEq total) by mouth 2 (two) times daily. 60 tablet 6  . torsemide (DEMADEX) 20 MG tablet Take 2 tablets (40 mg total) by mouth once. (Patient taking differently: Take 20 mg by mouth once. ) 180 tablet 3  . warfarin (COUMADIN) 4 MG tablet TAKE ONE TABLETONCE DAILY OR AS INSTRUCTED FOR INR 2.0 - 3.0 60 tablet 5  . atorvastatin (LIPITOR) 40 MG tablet Take 1 tablet (40 mg total) by mouth daily. 90 tablet 3  . carvedilol (COREG) 3.125 MG tablet Take 1 tablet (3.125 mg total) by mouth 2 (two) times daily with a meal. 90 tablet 3  . clopidogrel  (PLAVIX) 75 MG tablet Take 1 tablet (75 mg total) by mouth daily with breakfast. 30 tablet 6  . losartan (COZAAR) 100 MG tablet Take 1 tablet (100 mg total) by mouth daily. 90 tablet 3  . tamsulosin (FLOMAX) 0.4 MG CAPS capsule Take 1 capsule (0.4 mg total) by mouth daily. 90 capsule 3   No current facility-administered medications for this encounter.   Facility-Administered Medications Ordered in Other Encounters  Medication Dose Route Frequency Provider Last Rate Last Dose  . sodium chloride 0.9 % injection 10-40 mL  10-40 mL Intracatheter PRN [name], MD        Ace inhibitors and Lexapro  REVIEW OF SYSTEMS: All systems negative except  as listed in HPI, PMH and Problem list.  Vital signs:  HR: 85  Doppler MAP: 82  Auto cuff: not picking up  O2 Sat: 96%  Wt: 187 lbs  Last wt: 186.4 lbs  Ht: 5'4"   Physical Exam: GENERAL: Well appearing, male who presents to clinic today HEENT: normal  NECK: Supple, JVP 7 prominent superficial veins on R (suspect occluded IJ)  No lymphadenopathy or thyromegaly appreciated.   CARDIAC:  Mechanical heart sounds with LVAD hum present, prominent heart sounds LUNGS:  Clear to auscultation bilaterally.  ABDOMEN:  Soft, round, mildly distended, positive bowel sounds x4.     LVAD exit site: Drive line out 1 cm with expose velour. No erythema or drainage.  Stabilization device present and accurately applied. Dressing being changed weekly. EXTREMITIES:  Warm and dry, no cyanosis, clubbing, rash, trace- 1+ edema. L>R NEUROLOGIC:  Alert and oriented x 4.  L-sided weakness. Gait steady.  No aphasia.  Affect pleasant.      ASSESSMENT AND PLAN:   1) Chronic systolic HF: NICM, s/p LVAD implant (08/2011) and subsequent (08/2013).  Stable NYHA II - Volume status improved. Only taking torsemide 20 daily (with kcl 20) instead of 40 but seems to be doing the job. Eating less microwave foods.   - Can increase torsemide to  daily as needed. Use metolazone  prn. Stressed ned to take extra kcl with extra torsemide or metolazone..  - Reinforced the need and importance of daily weights, a low sodium diet, and fluid restriction (less than 2 L a day). Instructed to call the HF clinic if weight increases more than 3 lbs overnight or 5 lbs in a week.  2) LVAD, s/p (08/2011) and then (08/2013) d/t pump thrombosis.  VAD interrogated personally. Recently with low flow alarms likely positional in nature. CT chest showed no kink in outflow graft though inflow cannula is very angulated. - Not a tx candidate due to compliance issues.  3) HTN: Improved with diuresis. 4) CVA: Stable.Continues to have left sided weakness. No longer in therapy. Continue atorvastatin 40 mg daily.  6) h/o Atrial Flutter: now in NSR 7) Anticoagulation management: INR Goal 2.5-3.0. Will check INR today and adjust accordingly. He is on ASA 325 daily as well. 8) RV failure: stable 9) LLE edema - Improved with diuresis. Also has component of venous stasis as result of CVA. Has trouble with compression stocking, Have suggested stocking donner for him.  Bensimhon, Daniel,MD 7:59 PM

## 2015-06-20 ENCOUNTER — Other Ambulatory Visit (INDEPENDENT_AMBULATORY_CARE_PROVIDER_SITE_OTHER): Payer: Medicare Other | Admitting: *Deleted

## 2015-06-20 DIAGNOSIS — I513 Intracardiac thrombosis, not elsewhere classified: Secondary | ICD-10-CM

## 2015-06-20 DIAGNOSIS — I8290 Acute embolism and thrombosis of unspecified vein: Secondary | ICD-10-CM

## 2015-06-20 DIAGNOSIS — I5023 Acute on chronic systolic (congestive) heart failure: Secondary | ICD-10-CM

## 2015-06-20 DIAGNOSIS — I5022 Chronic systolic (congestive) heart failure: Secondary | ICD-10-CM

## 2015-06-20 DIAGNOSIS — I5020 Unspecified systolic (congestive) heart failure: Secondary | ICD-10-CM | POA: Diagnosis not present

## 2015-06-20 DIAGNOSIS — I059 Rheumatic mitral valve disease, unspecified: Secondary | ICD-10-CM | POA: Diagnosis not present

## 2015-06-20 DIAGNOSIS — I213 ST elevation (STEMI) myocardial infarction of unspecified site: Secondary | ICD-10-CM

## 2015-06-20 NOTE — Addendum Note (Signed)
Addended by: Tonita Phoenix on: 06/20/2015 01:25 PM   Modules accepted: Orders

## 2015-06-20 NOTE — Addendum Note (Signed)
Addended by: Tonita Phoenix on: 06/20/2015 01:24 PM   Modules accepted: Orders

## 2015-06-21 ENCOUNTER — Ambulatory Visit (HOSPITAL_COMMUNITY): Payer: Self-pay | Admitting: Infectious Diseases

## 2015-06-21 LAB — PROTIME-INR
INR: 3.29 — ABNORMAL HIGH (ref <1.50)
PROTHROMBIN TIME: 34 s — AB (ref 11.6–15.2)

## 2015-06-26 ENCOUNTER — Other Ambulatory Visit (HOSPITAL_COMMUNITY): Payer: Self-pay | Admitting: Cardiology

## 2015-06-28 ENCOUNTER — Encounter (HOSPITAL_COMMUNITY): Payer: Self-pay | Admitting: Infectious Diseases

## 2015-07-05 ENCOUNTER — Other Ambulatory Visit (INDEPENDENT_AMBULATORY_CARE_PROVIDER_SITE_OTHER): Payer: Medicare Other

## 2015-07-05 DIAGNOSIS — I213 ST elevation (STEMI) myocardial infarction of unspecified site: Secondary | ICD-10-CM | POA: Diagnosis not present

## 2015-07-06 ENCOUNTER — Other Ambulatory Visit (INDEPENDENT_AMBULATORY_CARE_PROVIDER_SITE_OTHER): Payer: Medicare Other | Admitting: *Deleted

## 2015-07-06 ENCOUNTER — Encounter (HOSPITAL_COMMUNITY): Payer: Self-pay | Admitting: *Deleted

## 2015-07-06 ENCOUNTER — Other Ambulatory Visit (HOSPITAL_COMMUNITY): Payer: Self-pay | Admitting: *Deleted

## 2015-07-06 ENCOUNTER — Ambulatory Visit (HOSPITAL_COMMUNITY): Payer: Self-pay | Admitting: *Deleted

## 2015-07-06 DIAGNOSIS — I513 Intracardiac thrombosis, not elsewhere classified: Secondary | ICD-10-CM

## 2015-07-06 DIAGNOSIS — I213 ST elevation (STEMI) myocardial infarction of unspecified site: Secondary | ICD-10-CM

## 2015-07-06 DIAGNOSIS — I8290 Acute embolism and thrombosis of unspecified vein: Secondary | ICD-10-CM

## 2015-07-06 DIAGNOSIS — I5022 Chronic systolic (congestive) heart failure: Secondary | ICD-10-CM

## 2015-07-06 DIAGNOSIS — Z7901 Long term (current) use of anticoagulants: Secondary | ICD-10-CM

## 2015-07-06 DIAGNOSIS — Z95811 Presence of heart assist device: Secondary | ICD-10-CM

## 2015-07-06 DIAGNOSIS — I5020 Unspecified systolic (congestive) heart failure: Secondary | ICD-10-CM

## 2015-07-06 LAB — PROTIME-INR
INR: 2.43 — AB (ref <1.50)
Prothrombin Time: 26.8 seconds — ABNORMAL HIGH (ref 11.6–15.2)

## 2015-07-20 ENCOUNTER — Other Ambulatory Visit (INDEPENDENT_AMBULATORY_CARE_PROVIDER_SITE_OTHER): Payer: Medicare Other | Admitting: *Deleted

## 2015-07-20 ENCOUNTER — Ambulatory Visit (HOSPITAL_COMMUNITY): Payer: Self-pay | Admitting: *Deleted

## 2015-07-20 DIAGNOSIS — Z95811 Presence of heart assist device: Secondary | ICD-10-CM

## 2015-07-20 DIAGNOSIS — Z7901 Long term (current) use of anticoagulants: Secondary | ICD-10-CM

## 2015-07-20 LAB — PROTIME-INR
INR: 2.01 — AB (ref 0.00–1.49)
PROTHROMBIN TIME: 22.7 s — AB (ref 11.6–15.2)

## 2015-07-20 NOTE — Addendum Note (Signed)
Addended by: Tonita Phoenix on: 07/20/2015 10:14 AM   Modules accepted: Orders

## 2015-07-27 ENCOUNTER — Telehealth (HOSPITAL_COMMUNITY): Payer: Self-pay | Admitting: Infectious Diseases

## 2015-07-27 ENCOUNTER — Other Ambulatory Visit: Payer: Medicare Other

## 2015-07-27 NOTE — Telephone Encounter (Signed)
Called re: missed INR appt today. He will "try to get it done tomorrow."

## 2015-07-31 ENCOUNTER — Other Ambulatory Visit (INDEPENDENT_AMBULATORY_CARE_PROVIDER_SITE_OTHER): Payer: Medicare Other | Admitting: *Deleted

## 2015-07-31 ENCOUNTER — Ambulatory Visit (HOSPITAL_COMMUNITY): Payer: Self-pay | Admitting: Infectious Diseases

## 2015-07-31 ENCOUNTER — Encounter (HOSPITAL_COMMUNITY): Payer: Self-pay | Admitting: Infectious Diseases

## 2015-07-31 DIAGNOSIS — I5020 Unspecified systolic (congestive) heart failure: Secondary | ICD-10-CM | POA: Diagnosis not present

## 2015-07-31 DIAGNOSIS — I5022 Chronic systolic (congestive) heart failure: Secondary | ICD-10-CM

## 2015-07-31 DIAGNOSIS — I059 Rheumatic mitral valve disease, unspecified: Secondary | ICD-10-CM

## 2015-07-31 LAB — PROTIME-INR
INR: 2.88 — ABNORMAL HIGH (ref 0.00–1.49)
PROTHROMBIN TIME: 29.7 s — AB (ref 11.6–15.2)

## 2015-07-31 NOTE — Addendum Note (Signed)
Addended by: Tonita Phoenix on: 07/31/2015 10:49 AM   Modules accepted: Orders

## 2015-08-08 ENCOUNTER — Encounter (HOSPITAL_COMMUNITY): Payer: Medicare Other

## 2015-08-09 ENCOUNTER — Other Ambulatory Visit (HOSPITAL_COMMUNITY): Payer: Self-pay | Admitting: Infectious Diseases

## 2015-08-09 ENCOUNTER — Ambulatory Visit (HOSPITAL_COMMUNITY)
Admission: RE | Admit: 2015-08-09 | Discharge: 2015-08-09 | Disposition: A | Discharge status: Home / Self Care | Payer: Medicare Other | Source: Ambulatory Visit | Attending: Cardiology | Admitting: Cardiology

## 2015-08-09 ENCOUNTER — Encounter: Payer: Self-pay | Admitting: Licensed Clinical Social Worker

## 2015-08-09 DIAGNOSIS — Z7901 Long term (current) use of anticoagulants: Secondary | ICD-10-CM | POA: Insufficient documentation

## 2015-08-09 DIAGNOSIS — I252 Old myocardial infarction: Secondary | ICD-10-CM | POA: Diagnosis not present

## 2015-08-09 DIAGNOSIS — Z8249 Family history of ischemic heart disease and other diseases of the circulatory system: Secondary | ICD-10-CM | POA: Diagnosis not present

## 2015-08-09 DIAGNOSIS — Z7902 Long term (current) use of antithrombotics/antiplatelets: Secondary | ICD-10-CM | POA: Diagnosis not present

## 2015-08-09 DIAGNOSIS — I5022 Chronic systolic (congestive) heart failure: Secondary | ICD-10-CM

## 2015-08-09 DIAGNOSIS — Z95811 Presence of heart assist device: Secondary | ICD-10-CM

## 2015-08-09 DIAGNOSIS — Z7982 Long term (current) use of aspirin: Secondary | ICD-10-CM | POA: Diagnosis not present

## 2015-08-09 DIAGNOSIS — I69354 Hemiplegia and hemiparesis following cerebral infarction affecting left non-dominant side: Secondary | ICD-10-CM | POA: Diagnosis not present

## 2015-08-09 DIAGNOSIS — K219 Gastro-esophageal reflux disease without esophagitis: Secondary | ICD-10-CM | POA: Insufficient documentation

## 2015-08-09 DIAGNOSIS — Z79899 Other long term (current) drug therapy: Secondary | ICD-10-CM

## 2015-08-09 DIAGNOSIS — G4730 Sleep apnea, unspecified: Secondary | ICD-10-CM | POA: Diagnosis not present

## 2015-08-09 DIAGNOSIS — N189 Chronic kidney disease, unspecified: Secondary | ICD-10-CM | POA: Insufficient documentation

## 2015-08-09 DIAGNOSIS — I428 Other cardiomyopathies: Secondary | ICD-10-CM | POA: Insufficient documentation

## 2015-08-09 DIAGNOSIS — I130 Hypertensive heart and chronic kidney disease with heart failure and stage 1 through stage 4 chronic kidney disease, or unspecified chronic kidney disease: Secondary | ICD-10-CM | POA: Insufficient documentation

## 2015-08-09 LAB — COMPREHENSIVE METABOLIC PANEL
ALBUMIN: 4.3 g/dL (ref 3.5–5.0)
ALT: 23 U/L (ref 17–63)
AST: 25 U/L (ref 15–41)
Alkaline Phosphatase: 147 U/L — ABNORMAL HIGH (ref 38–126)
Anion gap: 11 (ref 5–15)
BILIRUBIN TOTAL: 1.7 mg/dL — AB (ref 0.3–1.2)
BUN: 20 mg/dL (ref 6–20)
CO2: 26 mmol/L (ref 22–32)
CREATININE: 1.45 mg/dL — AB (ref 0.61–1.24)
Calcium: 9.5 mg/dL (ref 8.9–10.3)
Chloride: 103 mmol/L (ref 101–111)
GFR calc Af Amer: >60 mL/min (ref >60)
GFR, EST NON AFRICAN AMERICAN: 54 mL/min — AB (ref >60)
GLUCOSE: 98 mg/dL (ref 65–99)
POTASSIUM: 3.8 mmol/L (ref 3.5–5.1)
Sodium: 140 mmol/L (ref 135–145)
TOTAL PROTEIN: 8 g/dL (ref 6.5–8.1)

## 2015-08-09 LAB — CBC
HEMATOCRIT: 41.2 % (ref 39.0–52.0)
Hemoglobin: 13.4 g/dL (ref 13.0–17.0)
MCH: 29.4 pg (ref 26.0–34.0)
MCHC: 32.5 g/dL (ref 30.0–36.0)
MCV: 90.4 fL (ref 78.0–100.0)
PLATELETS: 232 10*3/uL (ref 150–400)
RBC: 4.56 MIL/uL (ref 4.22–5.81)
RDW: 15 % (ref 11.5–15.5)
WBC: 3.2 10*3/uL — AB (ref 4.0–10.5)

## 2015-08-09 LAB — LACTATE DEHYDROGENASE: LDH: 317 U/L — ABNORMAL HIGH (ref 98–192)

## 2015-08-09 LAB — PROTIME-INR
INR: 2.74 — AB (ref 0.00–1.49)
PROTHROMBIN TIME: 28.6 s — AB (ref 11.6–15.2)

## 2015-08-09 LAB — BRAIN NATRIURETIC PEPTIDE: B Natriuretic Peptide: 119.3 pg/mL — ABNORMAL HIGH (ref 0.0–100.0)

## 2015-08-09 NOTE — Progress Notes (Addendum)
. Symptom  Yes  No  Details   Angina         x Activity:   Claudication         x How far:    Syncope         x When:    Stroke        x  Jan 2015; Left sided residual weakness   Orthopnea         x How many pillows: one; sleeps flat   PND               x How often:   CPAP      N/A How many hrs:   Pedal edema        x        Occasionally but improved   Abd fullness        x        Occasionally but improved  N&V         x Good appetite; eating full meal   Diaphoresis         x When:  Bleeding        x   Urine color    light yellow  SOB        x        Activity: Incline; more fatigue in legs than SOB  Palpitations         x When:  ICD shock         x   Hospitlizaitons                x When/where/why:    ED visit         x When/where/why:  Other MD         x When/who/why:  Activity           Unemployed and not much activity   Fluid           No limitations   Diet     No limitations, eating less salt    Vital signs: HR: 92 Doppler MAP: 92 Auto cuff: not picking up  O2 Sat: 96% Wt: 188 lbs    Last wt: 187 lbs     Ht: 5'4"  LVAD interrogation reveals (reviewed with Dr Shirlee Latch):  Speed:  8600 Flow: 3.5 Power: 4.1w PI: 6.4  Alarms: Events: 5 - 25 PI events/day with outliers on 08/07/15 with > 50 data only through 07/31/15 LOW FLOWS--> stable; reports they occur while laying on left side.   08/08/15 - 2  08/07/15 - 1  08/02/15 - 1  Fixed speed:  8600 Low speed limit: 8000  **2016 CT of Chest Reports--The position of the inflow cannula is very angulated, with tip directed toward the apical anterior wall. Positional events-->encouraged him to turn to another position when these alarms occur.   Primary Controller:  Replace back up battery in 15 months (03/2016)  [id number]; version V7.23 Back up controller:   Replace back up battery in unknown  months [id number]; version V7.23  **Back up controller not present today--> per old records due to be changed 08/2015. Advised he needs to  bring controller with him next visit or sooner to ensure proper functioning and it is charged.   **Batteries are expired as of Jan 2016--> will order 8 more and call when they are ready and available for him to pick up.   LVAD Exit Site:  VAD dressing dry and intact with attachment device  accurately applied and driveline secured. Approximately 1 - 2 cm of velour remains outside of exit site with old bloody drainage noted. Patient reports he has been doing dressing changes himself lately. Dressing is C/D/I and correctly applied. Pt denies fever or chills. Driveline dressing is being changed weekly. Anchor device not present--Reapplied and counseled re: importance of consistent use as he has already pulled out some of his DL as noted above. Provided with 4 dressing kits as he states he has 6 at home.   Clinic Encounter:  Presents to clinic today for 94m  follow up. Swelling improved with L>R. Discussed using compression stocking however he will not use and would like to try to eat less salt. No complaints today. No refills needed today.   INR 2.74 today-- goal 2.5 - 3.0 with FD ASA and Plavix 75 mg. See anticoag note.   LDH 317 today which is within established baseline of 300 - 350.   MAP 92 on current regimen--> needs to take afternoon Hydralazine. Normally with earlier appts controlled on current regimen.   RTC in 2 months. Advised to call sooner if he notices his weight/peripheral swelling increasing. D/W him that he no longer sees [facility] for routine care for LVAD. Will D/W team about having him taken on for full time care and transition INTERMACs to our center.   Rexene Alberts, RN, BSN, CCRN  VAD Coordinator   Office: [phone] 24/7 VAD Pager: [phone]   ADVANCED HF CLINIC NOTE  Patient ID: [name], male   DOB: January 28, 1961, 55 y.o.   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   He was admitted to [facility] January 2015 with NSTEMI and RV failure. He had a difficult course that was complicated by embolic right CVA. He was intubated and eventually had a tracheostomy placed 07/28/13. He was transferred to inpatient rehab for extensive rehab and was making progression, however despite aggressive therapy with heparin, coumadin, ASA and Plavix, his LDH remained persistently elevated in the 950 range (was about 1750 on admit). He did not experience any pump dysfunction, however the case was discussed with Dr Allena Katz and [name] at [facility] and it was felt to transfer him down there for possible pump exchange. He underwent a pump exchange 08/31/13. Post op was complicated by bleeding and he received multiple transfusions. He had AKI and had intermittent HD.  Admitted 5/13-5/18/15 for volume overload and possible hemolysis. LDH 647 and INR 1.69 on admission. Treated with IV lasix and IV heparin. While in the hospital developed some PI events and speed was decreased to 9400. ICD interrogated and showed A flutter and he was placed on IV amiodarone and then transitioned to 200 mg PO BID. He cardioverted to NSR on 5/15. LDH trended down and on discharge was 530 and INR 2.4.  In January 2016 admitted with multiple low flow alarms. Diuretics held and given IVF with improvement. Echo showed severe RV dysfunction  Admitted May 17,2016 from HF.LVAD clinic with volume overload. Diuresed with IV lasix.Multiple low flow alarms particularly in the am felt to be positional. Discharge weight was 175 pounds.    Ramp echo in 7/16, decreased speed to 8600 rpm.   Follow up for Heart Failure and LVAD:  He seems to be doing well currently.  Weight is stable.  Takes torsemide 20 mg once  daily.  Has some lower extremity edema but no dyspnea with his usual activities. MAP mildly elevated at 92 but has not taken all his meds yet.  No orthopnea/PND.  No lightheadedness.  Working  full time at [employer].   Denies driveline trauma, erythema or drainage.  Denies ICD shocks.   Reports taking Coumadin as prescribed and adherence to anticoagulation based dietary restrictions.  Denies bright red blood per rectum or melena, no dark urine or hematuria.  LVAD interrogation reveals:  See LVAD nurse's note above.  SH: Disabled, lives at home with caregiver. No ETOH or smoking  FH: Mother living; HTN        Father living; no health issues    Past Medical History  Diagnosis Date  . CHF (congestive heart failure) (HCC)     EF- 10-15  . Medically noncompliant   . Mitral regurgitation   . Tobacco user   . HTN (hypertension)   . AICD (automatic cardioverter/defibrillator) present   . GERD (gastroesophageal reflux disease)   . Substance abuse   . Chronic renal insufficiency   . Syncope   . Thrombus 08/06/2010  . SYSTOLIC HEART FAILURE, CHRONIC 09/22/2008    Qualifier: Diagnosis of  By: Gala Romney, MD, Trixie Dredge   . LV (left ventricular) mural thrombus (HCC) 01/28/2011  . ICD - IN SITU 09/16/2008    Qualifier: Diagnosis of  By: Wonda Amis    . MITRAL STENOSIS/ INSUFFICIENCY, NON-RHEUMATIC 09/22/2008    Qualifier: Diagnosis of  By: Gala Romney, MD, Trixie Dredge Hepatomegaly 09/16/2008    Qualifier: Diagnosis of  By: Wonda Amis    . High cholesterol 02/26/2012    "at one time"  . Sleep apnea   . Exertional dyspnea 02/26/2012  . History of blood transfusion 08/2011    "when I had heart pump"  . Migraines   . COMMON MIGRAINE 06/14/2009    Qualifier: Diagnosis of  By: Jonny Ruiz MD, Len Blalock   . History of gout 02/26/2012  . Depression 08/11/2013    Pt denies  . Bipolar affective disorder (HCC) 10/22/2011    pt denies this hx 02/26/2012    Current Outpatient Prescriptions  Medication Sig Dispense Refill  . aspirin 325 MG EC tablet Take 1 tablet (325 mg total) by mouth daily. 30 tablet 6  . atorvastatin (LIPITOR) 40 MG tablet Take 1 tablet (40 mg total) by  mouth daily. 90 tablet 3  . carvedilol (COREG) 3.125 MG tablet Take 1 tablet (3.125 mg total) by mouth 2 (two) times daily with a meal. 90 tablet 3  . clopidogrel (PLAVIX) 75 MG tablet Take 1 tablet (75 mg total) by mouth daily with breakfast. 30 tablet 6  . clopidogrel (PLAVIX) 75 MG tablet TAKE 1 TABLET (75 MG TOTAL) BY MOUTH DAILY WITH BREAKFAST. 30 tablet 4  . hydrALAZINE (APRESOLINE) 50 MG tablet Take 1 tablet (50 mg total) by mouth 3 (three) times daily. 270 tablet 3  . losartan (COZAAR) 100 MG tablet Take 1 tablet (100 mg total) by mouth daily. 90 tablet 3  . metolazone (ZAROXOLYN) 2.5 MG tablet Take 1 tablet (2.5 mg total) by mouth as needed (When MD tells you to take it). 10 tablet 3  . potassium chloride SA (K-DUR,KLOR-CON) 20 MEQ tablet Take 2 tablets (40 mEq total) by mouth 2 (two) times daily. 60 tablet 6  . tamsulosin (FLOMAX) 0.4 MG CAPS capsule Take 1 capsule (0.4 mg total) by mouth daily. 90 capsule 3  .  torsemide (DEMADEX) 20 MG tablet Take 2 tablets (40 mg total) by mouth once. (Patient taking differently: Take 20 mg by mouth once. ) 180 tablet 3  . warfarin (COUMADIN) 4 MG tablet TAKE ONE TABLETONCE DAILY OR AS INSTRUCTED FOR INR 2.0 - 3.0 60 tablet 5   No current facility-administered medications for this encounter.   Facility-Administered Medications Ordered in Other Encounters  Medication Dose Route Frequency Provider Last Rate Last Dose  . sodium chloride 0.9 % injection 10-40 mL  10-40 mL Intracatheter PRN [name], MD        Ace inhibitors and Lexapro  REVIEW OF SYSTEMS: All systems negative except as listed in HPI, PMH and Problem list.  Vital signs:  HR: 85  Doppler MAP: 82  Auto cuff: not picking up  O2 Sat: 96%  Wt: 187 lbs  Last wt: 186.4 lbs  Ht: 5'4"   Physical Exam: GENERAL: Well appearing, male who presents to clinic today HEENT: normal  NECK: Supple, JVP 7 prominent superficial veins on R (suspect occluded IJ)  No lymphadenopathy or  thyromegaly appreciated.   CARDIAC:  Mechanical heart sounds with LVAD hum present, prominent heart sounds LUNGS:  Clear to auscultation bilaterally.  ABDOMEN:  Soft, round, mildly distended, positive bowel sounds x4.     LVAD exit site: Drive line out 1 cm with exposed velour (no change). No erythema or drainage.  Stabilization device present and accurately applied. Dressing being changed weekly. EXTREMITIES:  Warm and dry, no cyanosis, clubbing, rash, trace- 1+ edema, L>R NEUROLOGIC:  Alert and oriented x 4.  L-sided weakness. Gait steady.  No aphasia.  Affect pleasant.      ASSESSMENT AND PLAN:   1) Chronic systolic HF: NICM, s/p LVAD implant (08/2011) and subsequent pump exchange (08/2013).  Stable NYHA II - Volume status improved. Only taking torsemide 20 daily (with kcl 20) instead of 40 but seems to be doing ok.  Has tried to cut back on sodium.   2) LVAD, s/p (08/2011) and then pump exchange (08/2013) due to pump thrombosis.  VAD interrogated personally. No alarms, overall stable. - Not a transplant candidate due to compliance issues.  3) HTN: MAP ok, no medication changes.  4) CVA: Stable.Continues to have mild left sided weakness. No longer in therapy. Continue atorvastatin 40 mg daily.  6) h/o Atrial Flutter: now in NSR 7) Anticoagulation management: INR Goal 2.5-3.0. Will check INR today and adjust accordingly. He is on ASA 325 daily as well along with Plavix given history of CVA.  8) RV failure: stable  Jemuel Laursen,MD 08/09/2015

## 2015-08-09 NOTE — Patient Instructions (Signed)
1. No medication changes today.   2. Will call you with your labs today.   3. Return to see [place] in 2 months. We will order you your batteries today also.

## 2015-08-09 NOTE — Progress Notes (Signed)
CSW met with patient in the clinic. Patient reports he is doing well and compliant with his medical regimen. Patient reports he is now living with his cousin and it is "working out". Patient made reference that "he takes care of me and I take care of him". Patient appeared in good spirits and looked well. CSW encouraged patient to attend the VAD Support Group next week. CSW will continue to follow for support as needed. Raquel Sarna, LCSW [phone]

## 2015-08-22 ENCOUNTER — Other Ambulatory Visit (HOSPITAL_COMMUNITY): Payer: Self-pay | Admitting: Infectious Diseases

## 2015-08-22 ENCOUNTER — Encounter (HOSPITAL_COMMUNITY): Payer: Self-pay | Admitting: Infectious Diseases

## 2015-08-22 DIAGNOSIS — Z7901 Long term (current) use of anticoagulants: Secondary | ICD-10-CM

## 2015-08-22 DIAGNOSIS — Z95811 Presence of heart assist device: Secondary | ICD-10-CM

## 2015-08-24 ENCOUNTER — Other Ambulatory Visit (HOSPITAL_COMMUNITY): Payer: Self-pay | Admitting: *Deleted

## 2015-08-24 MED ORDER — WARFARIN SODIUM 4 MG PO TABS: ORAL_TABLET | ORAL | Status: DC

## 2015-08-28 ENCOUNTER — Ambulatory Visit (HOSPITAL_COMMUNITY): Payer: Self-pay | Admitting: *Deleted

## 2015-08-28 ENCOUNTER — Encounter (HOSPITAL_COMMUNITY): Payer: Self-pay | Admitting: *Deleted

## 2015-08-28 ENCOUNTER — Other Ambulatory Visit (INDEPENDENT_AMBULATORY_CARE_PROVIDER_SITE_OTHER): Payer: Medicare Other | Admitting: *Deleted

## 2015-08-28 DIAGNOSIS — Z7901 Long term (current) use of anticoagulants: Secondary | ICD-10-CM

## 2015-08-28 DIAGNOSIS — Z95811 Presence of heart assist device: Secondary | ICD-10-CM

## 2015-08-28 LAB — PROTIME-INR
INR: 2.03 — AB (ref 0.00–1.49)
Prothrombin Time: 22.8 seconds — ABNORMAL HIGH (ref 11.6–15.2)

## 2015-08-29 ENCOUNTER — Other Ambulatory Visit: Payer: Medicare Other

## 2015-09-11 ENCOUNTER — Ambulatory Visit (HOSPITAL_COMMUNITY): Payer: Self-pay | Admitting: *Deleted

## 2015-09-11 ENCOUNTER — Encounter (HOSPITAL_COMMUNITY): Payer: Self-pay | Admitting: *Deleted

## 2015-09-11 ENCOUNTER — Other Ambulatory Visit: Payer: Medicare Other | Admitting: *Deleted

## 2015-09-11 ENCOUNTER — Other Ambulatory Visit (HOSPITAL_COMMUNITY): Payer: Medicare Other

## 2015-09-11 ENCOUNTER — Other Ambulatory Visit (HOSPITAL_COMMUNITY): Payer: Self-pay | Admitting: *Deleted

## 2015-09-11 DIAGNOSIS — Z7901 Long term (current) use of anticoagulants: Secondary | ICD-10-CM

## 2015-09-11 DIAGNOSIS — Z95811 Presence of heart assist device: Secondary | ICD-10-CM

## 2015-09-11 LAB — PROTIME-INR
INR: 2.31 — ABNORMAL HIGH (ref 0.00–1.49)
PROTHROMBIN TIME: 25.1 s — AB (ref 11.6–15.2)

## 2015-09-11 NOTE — Addendum Note (Signed)
Addended by: Tonita Phoenix on: 09/11/2015 01:37 PM   Modules accepted: Orders

## 2015-09-25 ENCOUNTER — Ambulatory Visit (HOSPITAL_COMMUNITY): Payer: Self-pay | Admitting: Unknown Physician Specialty

## 2015-09-25 ENCOUNTER — Other Ambulatory Visit (HOSPITAL_COMMUNITY): Payer: Medicare Other

## 2015-09-25 ENCOUNTER — Other Ambulatory Visit (INDEPENDENT_AMBULATORY_CARE_PROVIDER_SITE_OTHER): Payer: Medicare Other | Admitting: *Deleted

## 2015-09-25 DIAGNOSIS — Z95811 Presence of heart assist device: Secondary | ICD-10-CM

## 2015-09-25 LAB — PROTIME-INR
INR: 2.37 — AB (ref 0.00–1.49)
PROTHROMBIN TIME: 25.6 s — AB (ref 11.6–15.2)

## 2015-09-26 ENCOUNTER — Encounter (HOSPITAL_COMMUNITY): Payer: Self-pay | Admitting: *Deleted

## 2015-10-11 ENCOUNTER — Other Ambulatory Visit (HOSPITAL_COMMUNITY): Payer: Self-pay | Admitting: *Deleted

## 2015-10-11 DIAGNOSIS — Z95811 Presence of heart assist device: Secondary | ICD-10-CM

## 2015-10-11 DIAGNOSIS — Z7901 Long term (current) use of anticoagulants: Secondary | ICD-10-CM

## 2015-10-12 ENCOUNTER — Ambulatory Visit (HOSPITAL_COMMUNITY)
Admission: RE | Admit: 2015-10-12 | Discharge: 2015-10-12 | Disposition: A | Discharge status: Home / Self Care | Payer: Medicare Other | Source: Ambulatory Visit | Attending: Internal Medicine | Admitting: Internal Medicine

## 2015-10-12 ENCOUNTER — Ambulatory Visit (HOSPITAL_COMMUNITY): Payer: Self-pay | Admitting: *Deleted

## 2015-10-12 VITALS — BP 84/0 | HR 80 | Ht 64.0 in | Wt 191.0 lb

## 2015-10-12 DIAGNOSIS — N189 Chronic kidney disease, unspecified: Secondary | ICD-10-CM | POA: Insufficient documentation

## 2015-10-12 DIAGNOSIS — Z8249 Family history of ischemic heart disease and other diseases of the circulatory system: Secondary | ICD-10-CM | POA: Insufficient documentation

## 2015-10-12 DIAGNOSIS — Z7901 Long term (current) use of anticoagulants: Secondary | ICD-10-CM | POA: Diagnosis not present

## 2015-10-12 DIAGNOSIS — I10 Essential (primary) hypertension: Secondary | ICD-10-CM

## 2015-10-12 DIAGNOSIS — I69354 Hemiplegia and hemiparesis following cerebral infarction affecting left non-dominant side: Secondary | ICD-10-CM | POA: Insufficient documentation

## 2015-10-12 DIAGNOSIS — I428 Other cardiomyopathies: Secondary | ICD-10-CM | POA: Insufficient documentation

## 2015-10-12 DIAGNOSIS — Z7902 Long term (current) use of antithrombotics/antiplatelets: Secondary | ICD-10-CM | POA: Insufficient documentation

## 2015-10-12 DIAGNOSIS — K219 Gastro-esophageal reflux disease without esophagitis: Secondary | ICD-10-CM | POA: Insufficient documentation

## 2015-10-12 DIAGNOSIS — I252 Old myocardial infarction: Secondary | ICD-10-CM | POA: Insufficient documentation

## 2015-10-12 DIAGNOSIS — Z79899 Other long term (current) drug therapy: Secondary | ICD-10-CM | POA: Insufficient documentation

## 2015-10-12 DIAGNOSIS — I130 Hypertensive heart and chronic kidney disease with heart failure and stage 1 through stage 4 chronic kidney disease, or unspecified chronic kidney disease: Secondary | ICD-10-CM | POA: Diagnosis not present

## 2015-10-12 DIAGNOSIS — Z95811 Presence of heart assist device: Secondary | ICD-10-CM | POA: Diagnosis not present

## 2015-10-12 DIAGNOSIS — I5022 Chronic systolic (congestive) heart failure: Secondary | ICD-10-CM | POA: Diagnosis not present

## 2015-10-12 DIAGNOSIS — I639 Cerebral infarction, unspecified: Secondary | ICD-10-CM | POA: Diagnosis not present

## 2015-10-12 DIAGNOSIS — Z7982 Long term (current) use of aspirin: Secondary | ICD-10-CM | POA: Diagnosis not present

## 2015-10-12 LAB — BASIC METABOLIC PANEL
ANION GAP: 11 (ref 5–15)
BUN: 17 mg/dL (ref 6–20)
CALCIUM: 9.5 mg/dL (ref 8.9–10.3)
CHLORIDE: 104 mmol/L (ref 101–111)
CO2: 26 mmol/L (ref 22–32)
Creatinine, Ser: 1.27 mg/dL — ABNORMAL HIGH (ref 0.61–1.24)
GFR calc non Af Amer: >60 mL/min (ref >60)
GLUCOSE: 94 mg/dL (ref 65–99)
Potassium: 4.7 mmol/L (ref 3.5–5.1)
Sodium: 141 mmol/L (ref 135–145)

## 2015-10-12 LAB — CBC
HCT: 40.2 % (ref 39.0–52.0)
HEMOGLOBIN: 13.2 g/dL (ref 13.0–17.0)
MCH: 29.4 pg (ref 26.0–34.0)
MCHC: 32.8 g/dL (ref 30.0–36.0)
MCV: 89.5 fL (ref 78.0–100.0)
PLATELETS: 206 10*3/uL (ref 150–400)
RBC: 4.49 MIL/uL (ref 4.22–5.81)
RDW: 13.6 % (ref 11.5–15.5)
WBC: 3.2 10*3/uL — ABNORMAL LOW (ref 4.0–10.5)

## 2015-10-12 LAB — LACTATE DEHYDROGENASE: LDH: 311 U/L — AB (ref 98–192)

## 2015-10-12 LAB — PROTIME-INR
INR: 3.99 — AB (ref 0.00–1.49)
PROTHROMBIN TIME: 37.9 s — AB (ref 11.6–15.2)

## 2015-10-12 NOTE — Progress Notes (Signed)
Patient ID: [name], male   DOB: [birth year], 55 y.o.     ADVANCED HF CLINIC NOTE  Patient ID: [name], male   DOB: 04-25-1961, 55 y.o.   MRN: [id number]  HPI: [name] is a 55 yo male with a history of severe CHF, NICM s/p LVAD and TVR (09/2011), VT, NSTEMI, LV thrombus, CKD, GERD, stroke and LVAD hemolysis with LVAD exchange (08/2013).   He was admitted to [facility] January 2015 with NSTEMI and RV failure. He had a difficult course that was complicated by embolic right CVA. He was intubated and eventually had a tracheostomy placed 07/28/13. He was transferred to inpatient rehab for extensive rehab and was making progression, however despite aggressive therapy with heparin, coumadin, ASA and Plavix, his LDH remained persistently elevated in the 950 range (was about 1750 on admit). He did not experience any pump dysfunction, however the case was discussed with Dr Allena Katz and [name] at [facility] and it was felt to transfer him down there for possible pump exchange. He underwent a pump exchange 08/31/13. Post op was complicated by bleeding and he received multiple transfusions. He had AKI and had intermittent HD.  Admitted 5/13-5/18/15 for volume overload and possible hemolysis. LDH 647 and INR 1.69 on admission. Treated with IV lasix and IV heparin. While in the hospital developed some PI events and speed was decreased to 9400. ICD interrogated and showed A flutter and he was placed on IV amiodarone and then transitioned to 200 mg PO BID. He cardioverted to NSR on 5/15. LDH trended down and on discharge was 530 and INR 2.4.  In January 2016 admitted with multiple low flow alarms. Diuretics held and given IVF with improvement. Echo showed severe RV dysfunction  Admitted May 17,2016 from HF.LVAD clinic with volume overload. Diuresed with IV lasix.Multiple low flow alarms particularly in the am felt to be positional. Discharge weight was 175 pounds.    Ramp echo in 7/16, decreased speed to 8600 rpm.   Follow up for  Heart Failure and LVAD:  Feels ok. Being much more compliant with medicines and INR follow-up. Still not watching his diet closely. Weight not changed much.  Takes torsemide 20 mg once daily.  Denies dyspnea with his usual activities.   No orthopnea/PND. No bleeding. +edema  No lightheadedness.  Working full time at [employer].   Denies driveline trauma, erythema or drainage.  Denies ICD shocks.   Reports taking Coumadin as prescribed and adherence to anticoagulation based dietary restrictions.  Denies bright red blood per rectum or melena, no dark urine or hematuria.   LVAD interrogation reveals (reviewed with Dr Shirlee Latch):  Speed: 8600  Flow: 3.5  Power: 4.1w  PI: 6.4  Alarms: Events: 5 - 25 PI events/day with outliers on 08/07/15 with > 50 data only through 07/31/15  LOW FLOWS--> stable; reports they occur while laying on left side.  08/08/15 - 2  08/07/15 - 1  08/02/15 - 1  Fixed speed: 8600  Low speed limit: 8000   SH: Disabled, lives at home with caregiver. No ETOH or smoking  FH: Mother living; HTN        Father living; no health issues    Past Medical History  Diagnosis Date  . CHF (congestive heart failure) (HCC)     EF- 10-15  . Medically noncompliant   . Mitral regurgitation   . Tobacco user   . HTN (hypertension)   . AICD (automatic cardioverter/defibrillator) present   . GERD (gastroesophageal reflux disease)   .  Substance abuse   . Chronic renal insufficiency   . Syncope   . Thrombus 08/06/2010  . SYSTOLIC HEART FAILURE, CHRONIC 09/22/2008    Qualifier: Diagnosis of  By: Gala Romney, MD, Trixie Dredge   . LV (left ventricular) mural thrombus (HCC) 01/28/2011  . ICD - IN SITU 09/16/2008    Qualifier: Diagnosis of  By: Wonda Amis    . MITRAL STENOSIS/ INSUFFICIENCY, NON-RHEUMATIC 09/22/2008    Qualifier: Diagnosis of  By: Gala Romney, MD, Trixie Dredge Hepatomegaly 09/16/2008    Qualifier: Diagnosis of  By: Wonda Amis    . High cholesterol 02/26/2012      "at one time"  . Sleep apnea   . Exertional dyspnea 02/26/2012  . History of blood transfusion 08/2011    "when I had heart pump"  . Migraines   . COMMON MIGRAINE 06/14/2009    Qualifier: Diagnosis of  By: Jonny Ruiz MD, Len Blalock   . History of gout 02/26/2012  . Depression 08/11/2013    Pt denies  . Bipolar affective disorder (HCC) 10/22/2011    pt denies this hx 02/26/2012    Current Outpatient Prescriptions  Medication Sig Dispense Refill  . atorvastatin (LIPITOR) 40 MG tablet Take 1 tablet (40 mg total) by mouth daily. 90 tablet 3  . carvedilol (COREG) 3.125 MG tablet Take 1 tablet (3.125 mg total) by mouth 2 (two) times daily with a meal. 90 tablet 3  . clopidogrel (PLAVIX) 75 MG tablet Take 1 tablet (75 mg total) by mouth daily with breakfast. 30 tablet 6  . hydrALAZINE (APRESOLINE) 50 MG tablet Take 1 tablet (50 mg total) by mouth 3 (three) times daily. 270 tablet 3  . losartan (COZAAR) 100 MG tablet Take 1 tablet (100 mg total) by mouth daily. 90 tablet 3  . potassium chloride SA (K-DUR,KLOR-CON) 20 MEQ tablet Take 2 tablets (40 mEq total) by mouth 2 (two) times daily. 60 tablet 6  . tamsulosin (FLOMAX) 0.4 MG CAPS capsule Take 1 capsule (0.4 mg total) by mouth daily. 90 capsule 3  . torsemide (DEMADEX) 20 MG tablet Take 2 tablets (40 mg total) by mouth once. (Patient taking differently: Take 20 mg by mouth once. ) 180 tablet 3  . warfarin (COUMADIN) 4 MG tablet TAKE ONE TABLETONCE DAILY OR AS INSTRUCTED FOR INR 2.0 - 3.0 140 tablet 1  . aspirin 325 MG EC tablet Take 1 tablet (325 mg total) by mouth daily. (Patient not taking: Reported on 10/12/2015) 30 tablet 6  . metolazone (ZAROXOLYN) 2.5 MG tablet Take 1 tablet (2.5 mg total) by mouth as needed (When MD tells you to take it). (Patient not taking: Reported on 10/12/2015) 10 tablet 3   No current facility-administered medications for this encounter.   Facility-Administered Medications Ordered in Other Encounters  Medication Dose Route  Frequency Provider Last Rate Last Dose  . sodium chloride 0.9 % injection 10-40 mL  10-40 mL Intracatheter PRN [name], MD        Ace inhibitors and Lexapro  REVIEW OF SYSTEMS: All systems negative except as listed in HPI, PMH and Problem list.  Vital signs:  HR: 92  Doppler MAP: 92  Auto cuff: not picking up  O2 Sat: 96%  Wt: 188 lbs  Last wt: 187 lbs  Ht: 5'4"   Physical Exam: GENERAL: Well appearing, male who presents to clinic today HEENT: normal  NECK: Supple, JVP 7 prominent superficial veins on R (suspect occluded IJ)  No  lymphadenopathy or thyromegaly appreciated.   CARDIAC:  Mechanical heart sounds with LVAD hum present, prominent heart sounds LUNGS:  Clear to auscultation bilaterally.  ABDOMEN:  Soft, round, mildly distended, positive bowel sounds x4.     LVAD exit site: Drive line out 1 cm with exposed velour (no change). No erythema or drainage.  Stabilization device present and accurately applied. Dressing being changed weekly. EXTREMITIES:  Warm and dry, no cyanosis, clubbing, rash, 1+ edema, L>R NEUROLOGIC:  Alert and oriented x 4.  L-sided weakness. Gait steady.  No aphasia.  Affect pleasant.      ASSESSMENT AND PLAN:   1) Chronic systolic HF: NICM, s/p LVAD implant (08/2011) and subsequent pump exchange (08/2013).  Stable NYHA II - Volume status slightly elevated. Will increase torsemide to  for 2 days.  Reinforced need for daily weights and reviewed use of sliding scale diuretics. 2) LVAD, s/p (08/2011) and then pump exchange (08/2013) due to pump thrombosis.  VAD interrogated personally. No alarms, overall stable. - Not a transplant candidate due to compliance issues.  3) HTN: MAP minimally elevated. Continue current regimen.  4) CVA: Stable.Continues to have mild left sided weakness. No longer in therapy. Continue atorvastatin 40 mg daily.  6) h/o Atrial Flutter: now in NSR 7) Anticoagulation management: INR Goal 2.5-3.0. Will check INR today and  adjust accordingly. He is on ASA 325 daily as well along with Plavix given history of CVA. He has been more compliant with anti-coag f/u and we congratulated him on this.   8) RV failure: stable  Kendi Defalco,MD 10/12/2015

## 2015-10-12 NOTE — Progress Notes (Signed)
. Symptom  Yes  No  Details   Angina         x Activity:   Claudication         x How far:    Syncope         x When:    Stroke        x  Jan 2015; Left sided residual weakness   Orthopnea         x How many pillows: one; sleeps flat   PND               x How often:   CPAP      N/A How many hrs:   Pedal edema        x        Occasionally but improved   Abd fullness        x        Occasionally but improved  N&V         x Good appetite; eating full meal   Diaphoresis         x When:  Bleeding        x   Urine color    light yellow  SOB        x        Activity: Incline; more fatigue in legs than SOB  Palpitations         x When:  ICD shock         x   Hospitlizaitons                x When/where/why:    ED visit         x When/where/why:  Other MD         x When/who/why:  Activity           Unemployed and not much activity   Fluid           No limitations   Diet     No limitations, eating less salt    Vital signs: HR: 92 Doppler MAP: 92 Auto cuff: not picking up  O2 Sat: 96% Wt: 188 lbs    Last wt: 187 lbs     Ht: 5'4"  LVAD interrogation reveals (reviewed with Dr Shirlee Latch):  Speed:  8600 Flow: 3.5 Power: 4.1w PI: 6.4  Alarms: Events: 5 - 25 PI events/day with outliers on 08/07/15 with > 50 data only through 07/31/15 LOW FLOWS--> stable; reports they occur while laying on left side.   08/08/15 - 2  08/07/15 - 1  08/02/15 - 1  Fixed speed:  8600 Low speed limit: 8000  **2016 CT of Chest Reports--The position of the inflow cannula is very angulated, with tip directed toward the apical anterior wall. Positional events-->encouraged him to turn to another position when these alarms occur.   Primary Controller:  Replace back up battery in 15 months (03/2016)  [id number]; version V7.23 Back up controller:   Replace back up battery in unknown  months PC [id number]; version V7.23  **Back up controller not present today--> per old records due to be changed 08/2015. Advised he needs to  bring controller with him next visit or sooner to ensure proper functioning and it is charged.   **Batteries are expired as of Jan 2016--> will order 8 more and call when they are ready and available for him to pick up.   LVAD Exit Site:  VAD dressing dry and intact with attachment device  accurately applied and driveline secured. Approximately 1 - 2 cm of velour remains outside of exit site with old bloody drainage noted. Patient reports he has been doing dressing changes himself lately. Dressing is C/D/I and correctly applied. Pt denies fever or chills. Driveline dressing is being changed weekly. Anchor device not present--Reapplied and counseled re: importance of consistent use as he has already pulled out some of his DL as noted above. Provided with 4 dressing kits as he states he has 6 at home.   Clinic Encounter:  Presents to clinic today for 70m  follow up. Swelling improved with L>R. Discussed using compression stocking however he will not use and would like to try to eat less salt. No complaints today. No refills needed today.   INR 2.74 today-- goal 2.5 - 3.0 with FD ASA and Plavix 75 mg. See anticoag note.   LDH 317 today which is within established baseline of 300 - 350.   MAP 92 on current regimen--> needs to take afternoon Hydralazine. Normally with earlier appts controlled on current regimen.   RTC in 2 months. Advised to call sooner if he notices his weight/peripheral swelling increasing. D/W him that he no longer sees [facility] for routine care for LVAD. Will D/W team about having him taken on for full time care and transition INTERMACs to our center.   Hessie Diener, RN, BSN VAD Coordinator  Office: [phone] 24/7 VAD Pager: [phone]

## 2015-10-13 NOTE — Addendum Note (Signed)
Encounter addended by: Levonne Spiller, RN on: 10/13/2015 10:16 AM<BR>     Documentation filed: Vitals Section, Patient Instructions Section, Notes Section

## 2015-10-13 NOTE — Progress Notes (Addendum)
. Symptom  Yes  No  Details   Angina         x Activity:   Claudication         x How far:    Syncope         x When:    Stroke        x  Jan 2015; Left sided residual weakness   Orthopnea         x How many pillows: one; sleeps flat   PND               x How often:   CPAP      N/A How many hrs:   Pedal edema        x        Occasionally but improved   Abd fullness        x        Occasionally but improved  N&V         x Good appetite; eating full meal   Diaphoresis         x When:  Bleeding        x   Urine color    light yellow  SOB        x        Activity: Incline; more fatigue in legs than SOB  Palpitations         x When:  ICD shock         x   Hospitlizaitons                x When/where/why:    ED visit         x When/where/why:  Other MD         x When/who/why:  Activity           Unemployed and not much activity   Fluid           No limitations   Diet     No limitations, eating less salt    Vital signs: HR:  84 Doppler MAP: 84 Auto cuff: not picking up  O2 Sat: 96% Wt: 191 lbs    Last wt: 188 lbs     Ht: 5'4"  LVAD interrogation reveals:  Speed:  8600 Flow: 4.1 Power: 4.4w PI: 4.7  Alarms: rare brief LOW FLOW during night while sleeping; pt denies hearing any alarms Events: 20 - 30 PI events daily  **2016 CT of Chest Reports--The position of the inflow cannula is very angulated, with tip directed toward the apical anterior wall. Positional events-->encouraged him to turn to another position when these alarms occur.    Fixed speed:  8600 Low speed limit: 8000  Primary Controller:  Replace back up battery in 13 months [id number] Back up controller:   Replace back up battery in  4 months;  PC [id number]  11V Back up battery replaced in back up controller [id number] today.   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  Replaced 14V batteries with 8 new batteries.   Backup system controller 11 volt battery replaced and charged during visit.    LVAD Exit Site: VAD dressing dry and intact with attachment device accurately applied and driveline secured. Approximately 1 - 2 cm of velour remains outside of exit site with old bloody drainage  noted.  Pt denies fever or chills. Driveline dressing is being changed weekly per caregiver (pt performs rarely). Dressing supplies provided for home use.     Clinic Encounter:  Presents to clinic today for follow up OV; accompanied by Irish Lack (primary caregiver now). Peripheral swelling improved; taking only 20 mg torsemide daily and only taking 20 mEq KCl daily. Fluid status up, Dr. Gala Romney instructed pt to take extra 20 mg torsemide daily for three days and to increase his K-Dur to 40 meq daily for those three days.    INR 3.99 today-- goal 2.5 - 3.0 with FD ASA and Plavix 75 mg. See anticoag note.   LDH 311 today which is within established baseline of 300 - 350.   MAP 84 on current regimen.   Patient Instructions: 1.  Take Torsemide 20 mg extra for three days. Take extra K-Dur 20 meq daily for three days. 2.  Will call you with lab results and coumadin dosing. Hold Coumadin tonight.  3.  Return to VAD clinic in 2 months.   Hessie Diener, RN, BSN VAD Coordinator  Office: [phone] 24/7 VAD Pager: [phone]

## 2015-10-13 NOTE — Patient Instructions (Signed)
1.  Take Torsemide 20 mg extra for three days. Take extra K-Dur 20 meq daily for three days. 2.  Will call you with lab results and coumadin dosing. Hold Coumadin tonight.  3.  Return to VAD clinic in 2 months.

## 2015-10-16 ENCOUNTER — Encounter (HOSPITAL_COMMUNITY): Payer: Self-pay | Admitting: *Deleted

## 2015-10-23 ENCOUNTER — Encounter (HOSPITAL_COMMUNITY): Payer: Self-pay | Admitting: Unknown Physician Specialty

## 2015-10-23 ENCOUNTER — Ambulatory Visit (HOSPITAL_COMMUNITY)
Admission: RE | Admit: 2015-10-23 | Discharge: 2015-10-23 | Disposition: A | Discharge status: Home / Self Care | Payer: Medicare Other | Source: Ambulatory Visit | Attending: Internal Medicine | Admitting: Internal Medicine

## 2015-10-23 ENCOUNTER — Ambulatory Visit (HOSPITAL_COMMUNITY): Payer: Self-pay | Admitting: Unknown Physician Specialty

## 2015-10-23 DIAGNOSIS — Z7901 Long term (current) use of anticoagulants: Secondary | ICD-10-CM | POA: Diagnosis not present

## 2015-10-23 DIAGNOSIS — Z5181 Encounter for therapeutic drug level monitoring: Secondary | ICD-10-CM | POA: Diagnosis not present

## 2015-10-23 DIAGNOSIS — Z95811 Presence of heart assist device: Secondary | ICD-10-CM | POA: Diagnosis not present

## 2015-10-23 LAB — PROTIME-INR
INR: 2.1 — ABNORMAL HIGH (ref 0.00–1.49)
Prothrombin Time: 23.4 seconds — ABNORMAL HIGH (ref 11.6–15.2)

## 2015-10-30 ENCOUNTER — Other Ambulatory Visit (HOSPITAL_COMMUNITY): Payer: Medicare Other

## 2015-10-30 ENCOUNTER — Other Ambulatory Visit (HOSPITAL_COMMUNITY): Payer: Self-pay | Admitting: Unknown Physician Specialty

## 2015-10-30 DIAGNOSIS — Z95811 Presence of heart assist device: Secondary | ICD-10-CM

## 2015-10-30 DIAGNOSIS — Z7901 Long term (current) use of anticoagulants: Secondary | ICD-10-CM

## 2015-11-06 ENCOUNTER — Ambulatory Visit (HOSPITAL_COMMUNITY): Payer: Self-pay | Admitting: Unknown Physician Specialty

## 2015-11-06 ENCOUNTER — Encounter (HOSPITAL_COMMUNITY): Payer: Self-pay | Admitting: Unknown Physician Specialty

## 2015-11-06 ENCOUNTER — Ambulatory Visit (HOSPITAL_COMMUNITY)
Admission: RE | Admit: 2015-11-06 | Discharge: 2015-11-06 | Disposition: A | Discharge status: Home / Self Care | Payer: Medicare Other | Source: Ambulatory Visit | Attending: Cardiology | Admitting: Cardiology

## 2015-11-06 DIAGNOSIS — Z5181 Encounter for therapeutic drug level monitoring: Secondary | ICD-10-CM | POA: Insufficient documentation

## 2015-11-06 DIAGNOSIS — Z7901 Long term (current) use of anticoagulants: Secondary | ICD-10-CM | POA: Diagnosis not present

## 2015-11-06 DIAGNOSIS — Z95811 Presence of heart assist device: Secondary | ICD-10-CM | POA: Diagnosis not present

## 2015-11-06 DIAGNOSIS — I5023 Acute on chronic systolic (congestive) heart failure: Secondary | ICD-10-CM | POA: Diagnosis not present

## 2015-11-06 LAB — PROTIME-INR
INR: 2.6 — AB (ref 0.00–1.49)
PROTHROMBIN TIME: 27.5 s — AB (ref 11.6–15.2)

## 2015-11-07 ENCOUNTER — Other Ambulatory Visit (HOSPITAL_COMMUNITY): Payer: Self-pay | Admitting: *Deleted

## 2015-11-07 DIAGNOSIS — Z95811 Presence of heart assist device: Secondary | ICD-10-CM

## 2015-11-07 DIAGNOSIS — Z7901 Long term (current) use of anticoagulants: Secondary | ICD-10-CM

## 2015-11-20 ENCOUNTER — Other Ambulatory Visit (HOSPITAL_COMMUNITY): Payer: Self-pay | Admitting: Cardiology

## 2015-11-22 ENCOUNTER — Other Ambulatory Visit (HOSPITAL_COMMUNITY): Payer: Medicare Other

## 2015-11-24 ENCOUNTER — Other Ambulatory Visit (HOSPITAL_COMMUNITY): Payer: Medicare Other

## 2015-11-27 ENCOUNTER — Other Ambulatory Visit (HOSPITAL_COMMUNITY): Payer: Medicare Other

## 2015-11-28 ENCOUNTER — Other Ambulatory Visit (HOSPITAL_COMMUNITY): Payer: Medicare Other

## 2015-11-28 ENCOUNTER — Encounter (HOSPITAL_COMMUNITY): Payer: Self-pay | Admitting: Unknown Physician Specialty

## 2015-11-28 ENCOUNTER — Ambulatory Visit (HOSPITAL_COMMUNITY)
Admission: RE | Admit: 2015-11-28 | Discharge: 2015-11-28 | Disposition: A | Discharge status: Home / Self Care | Payer: Medicare Other | Source: Ambulatory Visit | Attending: Cardiology | Admitting: Cardiology

## 2015-11-28 DIAGNOSIS — Z95811 Presence of heart assist device: Secondary | ICD-10-CM

## 2015-11-28 DIAGNOSIS — Z7901 Long term (current) use of anticoagulants: Secondary | ICD-10-CM

## 2015-11-28 DIAGNOSIS — Z5181 Encounter for therapeutic drug level monitoring: Secondary | ICD-10-CM | POA: Insufficient documentation

## 2015-11-28 LAB — PROTIME-INR
INR: 2.23 — ABNORMAL HIGH (ref 0.00–1.49)
Prothrombin Time: 24.5 seconds — ABNORMAL HIGH (ref 11.6–15.2)

## 2015-12-01 ENCOUNTER — Other Ambulatory Visit (HOSPITAL_COMMUNITY): Payer: Self-pay | Admitting: Adult Health

## 2015-12-11 ENCOUNTER — Ambulatory Visit (HOSPITAL_COMMUNITY): Payer: Self-pay | Admitting: Unknown Physician Specialty

## 2015-12-11 ENCOUNTER — Ambulatory Visit (HOSPITAL_COMMUNITY)
Admission: RE | Admit: 2015-12-11 | Discharge: 2015-12-11 | Disposition: A | Discharge status: Home / Self Care | Payer: Medicare Other | Source: Ambulatory Visit | Attending: Cardiology | Admitting: Cardiology

## 2015-12-11 ENCOUNTER — Other Ambulatory Visit (HOSPITAL_COMMUNITY): Payer: Self-pay | Admitting: Unknown Physician Specialty

## 2015-12-11 DIAGNOSIS — Z7901 Long term (current) use of anticoagulants: Secondary | ICD-10-CM

## 2015-12-11 DIAGNOSIS — Z5181 Encounter for therapeutic drug level monitoring: Secondary | ICD-10-CM | POA: Diagnosis present

## 2015-12-11 DIAGNOSIS — Z95811 Presence of heart assist device: Secondary | ICD-10-CM

## 2015-12-11 LAB — PROTIME-INR
INR: 2.76 — AB (ref 0.00–1.49)
Prothrombin Time: 28.8 seconds — ABNORMAL HIGH (ref 11.6–15.2)

## 2015-12-12 ENCOUNTER — Other Ambulatory Visit (HOSPITAL_COMMUNITY): Payer: Self-pay | Admitting: Unknown Physician Specialty

## 2015-12-12 ENCOUNTER — Encounter (HOSPITAL_COMMUNITY): Payer: Self-pay | Admitting: Unknown Physician Specialty

## 2015-12-12 ENCOUNTER — Ambulatory Visit (HOSPITAL_COMMUNITY)
Admission: RE | Admit: 2015-12-12 | Discharge: 2015-12-12 | Disposition: A | Discharge status: Home / Self Care | Payer: Medicare Other | Source: Ambulatory Visit | Attending: Internal Medicine | Admitting: Internal Medicine

## 2015-12-12 VITALS — BP 111/68 | HR 70 | Ht 64.0 in | Wt 187.4 lb

## 2015-12-12 DIAGNOSIS — I69354 Hemiplegia and hemiparesis following cerebral infarction affecting left non-dominant side: Secondary | ICD-10-CM | POA: Insufficient documentation

## 2015-12-12 DIAGNOSIS — I10 Essential (primary) hypertension: Secondary | ICD-10-CM | POA: Diagnosis not present

## 2015-12-12 DIAGNOSIS — I5022 Chronic systolic (congestive) heart failure: Secondary | ICD-10-CM | POA: Diagnosis not present

## 2015-12-12 DIAGNOSIS — Z79899 Other long term (current) drug therapy: Secondary | ICD-10-CM | POA: Insufficient documentation

## 2015-12-12 DIAGNOSIS — Z7982 Long term (current) use of aspirin: Secondary | ICD-10-CM | POA: Insufficient documentation

## 2015-12-12 DIAGNOSIS — N189 Chronic kidney disease, unspecified: Secondary | ICD-10-CM | POA: Insufficient documentation

## 2015-12-12 DIAGNOSIS — Z7901 Long term (current) use of anticoagulants: Secondary | ICD-10-CM | POA: Diagnosis not present

## 2015-12-12 DIAGNOSIS — K219 Gastro-esophageal reflux disease without esophagitis: Secondary | ICD-10-CM | POA: Diagnosis not present

## 2015-12-12 DIAGNOSIS — Z95811 Presence of heart assist device: Secondary | ICD-10-CM | POA: Insufficient documentation

## 2015-12-12 DIAGNOSIS — Z7902 Long term (current) use of antithrombotics/antiplatelets: Secondary | ICD-10-CM | POA: Diagnosis not present

## 2015-12-12 DIAGNOSIS — I130 Hypertensive heart and chronic kidney disease with heart failure and stage 1 through stage 4 chronic kidney disease, or unspecified chronic kidney disease: Secondary | ICD-10-CM | POA: Diagnosis not present

## 2015-12-12 DIAGNOSIS — Z8249 Family history of ischemic heart disease and other diseases of the circulatory system: Secondary | ICD-10-CM | POA: Diagnosis not present

## 2015-12-12 DIAGNOSIS — I252 Old myocardial infarction: Secondary | ICD-10-CM | POA: Diagnosis not present

## 2015-12-12 NOTE — Patient Instructions (Signed)
1. Return for labs in 2 weeks. 2. Return to clinic in 2 months.

## 2015-12-12 NOTE — Progress Notes (Signed)
Patient ID: Tasha Bussiere, male   DOB: 05-05-1961, 55 y.o.   MRN: [id number]   ADVANCED HF CLINIC NOTE  Patient ID: Alando Trotto, male   DOB: [birth year], 55 y.o.   MRN: [id number]  HPI: Mervyn is a 55 yo male with a history of severe CHF, NICM s/p LVAD and TVR (09/2011), VT, NSTEMI, LV thrombus, CKD, GERD, stroke and LVAD hemolysis with LVAD exchange (08/2013).   He was admitted to [facility] January 2015 with NSTEMI and RV failure. He had a difficult course that was complicated by embolic right CVA. He was intubated and eventually had a tracheostomy placed 07/28/13. He was transferred to inpatient rehab for extensive rehab and was making progression, however despite aggressive therapy with heparin, coumadin, ASA and Plavix, his LDH remained persistently elevated in the 950 range (was about 1750 on admit). He did not experience any pump dysfunction, however the case was discussed with Dr Allena Katz and [name] at [facility] and it was felt to transfer him down there for possible pump exchange. He underwent a pump exchange 08/31/13. Post op was complicated by bleeding and he received multiple transfusions. He had AKI and had intermittent HD.  Admitted 5/13-5/18/15 for volume overload and possible hemolysis. LDH 647 and INR 1.69 on admission. Treated with IV lasix and IV heparin. While in the hospital developed some PI events and speed was decreased to 9400. ICD interrogated and showed A flutter and he was placed on IV amiodarone and then transitioned to 200 mg PO BID. He cardioverted to NSR on 5/15. LDH trended down and on discharge was 530 and INR 2.4.  In January 2016 admitted with multiple low flow alarms. Diuretics held and given IVF with improvement. Echo showed severe RV dysfunction  Admitted May 17,2016 from HF.LVAD clinic with volume overload. Diuresed with IV lasix.Multiple low flow alarms particularly in the am felt to be positional. Discharge weight was 175 pounds.    Ramp echo in 7/16, decreased speed to 8600 rpm.    Follow up for Heart Failure and LVAD:  Feels good today. He has been much more compliant with medicines and INR follow-up. Weight stable.  Takes torsemide 20 mg once daily.  Denies dyspnea with his usual activities.   No orthopnea/PND or edema.  No bleeding. No lightheadedness.  Working full time at [employer]. No problems with driveline site.  Denies driveline trauma, erythema or drainage.  Denies ICD shocks.   Reports taking Coumadin as prescribed and adherence to anticoagulation based dietary restrictions.  Denies bright red blood per rectum or melena, no dark urine or hematuria.  LVAD interrogation reveals:  Speed: 8600 Flow: 3.5 Power: 4.1w PI: 6.1  Alarms: rare brief LOW FLOW during night while sleeping; pt states that he did hear an alarm during the night; he changed positions and the beeping stopped. LOW FLOW X 1 ON 6/19 And then multiple LOW FLOW alarms on 6/14. Events: 20 - 30 PI events daily: pt had an isolated day on 6/17 that he had 40+ PI events-pt denies any changes in his activity and doesn't remember feeling bad or different on this day  **2016 CT of Chest Reports--The position of the inflow cannula is very angulated, with tip directed toward the apical anterior wall. Positional events-->encouraged him to turn to another position when these alarms occur.   Fixed speed: 8600 Low speed limit: 8000  Primary Controller: Replace back up battery in 11 months [id number] Back up controller: Replace back up battery in 34 months; [id number]  SH:  Disabled, lives at home with caregiver. No ETOH or smoking  FH: Mother living; HTN        Father living; no health issues    Past Medical History  Diagnosis Date  . CHF (congestive heart failure) (HCC)     EF- 10-15  . Medically noncompliant   . Mitral regurgitation   . Tobacco user   . HTN (hypertension)   . AICD (automatic cardioverter/defibrillator) present   . GERD (gastroesophageal reflux disease)   .  Substance abuse   . Chronic renal insufficiency   . Syncope   . Thrombus 08/06/2010  . SYSTOLIC HEART FAILURE, CHRONIC 09/22/2008    Qualifier: Diagnosis of  By: Gala Romney, MD, Trixie Dredge   . LV (left ventricular) mural thrombus (HCC) 01/28/2011  . ICD - IN SITU 09/16/2008    Qualifier: Diagnosis of  By: Wonda Amis    . MITRAL STENOSIS/ INSUFFICIENCY, NON-RHEUMATIC 09/22/2008    Qualifier: Diagnosis of  By: Gala Romney, MD, Trixie Dredge Hepatomegaly 09/16/2008    Qualifier: Diagnosis of  By: Wonda Amis    . High cholesterol 02/26/2012    "at one time"  . Sleep apnea   . Exertional dyspnea 02/26/2012  . History of blood transfusion 08/2011    "when I had heart pump"  . Migraines   . COMMON MIGRAINE 06/14/2009    Qualifier: Diagnosis of  By: Jonny Ruiz MD, Len Blalock   . History of gout 02/26/2012  . Depression 08/11/2013    Pt denies  . Bipolar affective disorder (HCC) 10/22/2011    pt denies this hx 02/26/2012    Current Outpatient Prescriptions  Medication Sig Dispense Refill  . atorvastatin (LIPITOR) 40 MG tablet Take 1 tablet (40 mg total) by mouth daily. 90 tablet 3  . carvedilol (COREG) 3.125 MG tablet Take 1 tablet (3.125 mg total) by mouth 2 (two) times daily with a meal. 90 tablet 3  . clopidogrel (PLAVIX) 75 MG tablet TAKE 1 TABLET (75 MG TOTAL) BY MOUTH DAILY WITH BREAKFAST. 30 tablet 4  . hydrALAZINE (APRESOLINE) 50 MG tablet Take 1 tablet (50 mg total) by mouth 3 (three) times daily. 270 tablet 3  . losartan (COZAAR) 100 MG tablet Take 1 tablet (100 mg total) by mouth daily. 90 tablet 3  . tamsulosin (FLOMAX) 0.4 MG CAPS capsule Take 1 capsule (0.4 mg total) by mouth daily. 90 capsule 3  . torsemide (DEMADEX) 20 MG tablet Take 2 tablets (40 mg total) by mouth once. (Patient taking differently: Take 20 mg by mouth once. ) 180 tablet 3  . warfarin (COUMADIN) 4 MG tablet TAKE ONE TABLETONCE DAILY OR AS INSTRUCTED FOR INR 2.0 - 3.0 140 tablet 1  . aspirin 325 MG EC  tablet Take 1 tablet (325 mg total) by mouth daily. (Patient not taking: Reported on 10/12/2015) 30 tablet 6  . clopidogrel (PLAVIX) 75 MG tablet Take 1 tablet (75 mg total) by mouth daily with breakfast. 30 tablet 6  . KLOR-CON M20 20 MEQ tablet TAKE 2 TABLETS BY MOUTH TWICE A DAY (Patient taking differently: taking 1 tablet per day) 60 tablet 4  . metolazone (ZAROXOLYN) 2.5 MG tablet Take 1 tablet (2.5 mg total) by mouth as needed (When MD tells you to take it). (Patient not taking: Reported on 10/12/2015) 10 tablet 3   No current facility-administered medications for this encounter.   Facility-Administered Medications Ordered in Other Encounters  Medication Dose Route Frequency Provider Last Rate Last Dose  .  sodium chloride 0.9 % injection 10-40 mL  10-40 mL Intracatheter PRN [name], MD        Ace inhibitors and Lexapro  REVIEW OF SYSTEMS: All systems negative except as listed in HPI, PMH and Problem list.  Vital signs: HR: 70 Doppler MAP: 92 Auto cuff: 111/68 (91)  O2 Sat: 99% Wt: 187.2 lbs  Last wt: 191lbs  Ht: 5'4"   Physical Exam: GENERAL: Well appearing, male who presents to clinic today HEENT: normal  NECK: Supple, JVP 8 prominent superficial veins on R (suspect occluded IJ)  No lymphadenopathy or thyromegaly appreciated.   CARDIAC:  Mechanical heart sounds with LVAD hum present, prominent heart sounds LUNGS:  Clear to auscultation bilaterally.  ABDOMEN:  Soft, round, mildly distended, positive bowel sounds x4.     LVAD exit site: Drive line out 1 cm with exposed velour (no change). No erythema or drainage.  Stabilization device present and accurately applied. Dressing being changed weekly. EXTREMITIES:  Warm and dry, no cyanosis, clubbing, rash, edema NEUROLOGIC:  Alert and oriented x 4.  L-sided weakness. Gait steady.  No aphasia.  Affect pleasant.      ASSESSMENT AND PLAN:   1) Chronic systolic HF: NICM, s/p LVAD implant (08/2011) and subsequent  pump exchange (08/2013).  Stable NYHA II - Volume status looks good. Continue current regimen. Congratulated him on compliance with regimen. Reinforced need for daily weights and reviewed use of sliding scale diuretics. 2) LVAD, s/p (08/2011) and then pump exchange (08/2013) due to pump thrombosis.  VAD interrogated personally. No alarms, overall stable. Still with some positional low flow alarms while lying in bed.  - Not a transplant candidate due to compliance issues.  3) HTN: MAP minimally elevated. Continue current regimen.  4) CVA: Stable.Continues to have mild left sided weakness but functions well . No longer in therapy. Continue atorvastatin 40 mg daily.  6) h/o Atrial Flutter: now in NSR 7) Anticoagulation management: INR Goal 2.5-3.0. Will check INR today and adjust accordingly. He is on ASA 325 daily as well along with Plavix given history of CVA. He has been more compliant with anti-coag f/u and we congratulated him on this.   8) RV failure: stable  Bensimhon, Daniel,MD 12/12/2015

## 2015-12-12 NOTE — Progress Notes (Signed)
. Symptom  Yes  No  Details   Angina         x Activity:   Claudication         x How far:    Syncope         x When:    Stroke        x  Jan 2015; Left sided residual weakness   Orthopnea         x How many pillows: one; sleeps flat   PND               x How often:   CPAP      N/A How many hrs:   Pedal edema        x        Occasionally but improved   Abd fullness        x        Occasionally but improved  N&V         x Good appetite; eating full meal   Diaphoresis         x When:  Bleeding        x   Urine color    light yellow  SOB        x        Activity: Incline  Palpitations         x When:  ICD shock         x   Hospitlizaitons                x When/where/why:    ED visit         x When/where/why:  Other MD         x When/who/why:  Activity           Unemployed and not much activity   Fluid           No limitations   Diet     No limitations, eating less salt    Vital signs: HR:  70 Doppler MAP: 92 Auto cuff: 111/68 (91)  O2 Sat: 99% Wt: 187.2 lbs    Last wt: 191lbs     Ht: 5'4"  LVAD interrogation reveals:  Speed:  8600 Flow: 3.5 Power: 4.1w PI: 6.1  Alarms: rare brief LOW FLOW during night while sleeping; pt states that he did hear an alarm during the night; he changed positions and the beeping stopped. LOW FLOW X 1 ON 6/19  And then multiple LOW FLOW alarms on 6/14. Events: 20 - 30 PI events daily: pt had an isolated day on 6/17 that he had 40+ PI events-pt denies any changes in his activity and doesn't remember feeling bad or different on this day  **2016 CT of Chest Reports--The position of the inflow cannula is very angulated, with tip directed toward the apical anterior wall. Positional events-->encouraged him to turn to another position when these alarms occur.    Fixed speed:  8600 Low speed limit: 8000  Primary Controller:  Replace back up battery in 11 months [id number] Back up controller:   Replace back up battery in  34 months;  [id number]    LVAD  Exit Site: VAD dressing dry and intact with attachment device accurately applied and driveline secured. Approximately 1 - 2 cm of velour remains outside of exit site with old bloody drainage noted.  Pt denies fever or chills. Driveline dressing is being changed weekly per caregiver (pt performs rarely). Dressing  supplies provided for home use.     Clinic Encounter:  Presents to clinic today for follow up OV; accompanied by Irish Lack (primary caregiver now). Peripheral swelling improved; taking only 20 mg torsemide daily and only taking 20 mEq KCl daily. Fluid status up, Dr. Gala Romney instructed pt to take extra 20 mg torsemide daily for three days and to increase his K-Dur to 40 meq daily for those three days.    INR 2.76 today-- goal 2.5 - 3.0 with ASA 81 and Plavix 75 mg. See anticoag note.   MAP 92 on current regimen.   Patient Instructions: 1. Return for labs in 2 weeks. 2. Return to clinic in 2 months.   Carlton Adam, RN, BSN VAD Coordinator  Office: [phone] 24/7 VAD Pager: [phone]

## 2015-12-20 ENCOUNTER — Other Ambulatory Visit (HOSPITAL_COMMUNITY): Payer: Self-pay | Admitting: Cardiology

## 2015-12-21 ENCOUNTER — Other Ambulatory Visit (HOSPITAL_COMMUNITY): Payer: Self-pay | Admitting: *Deleted

## 2015-12-21 DIAGNOSIS — I5022 Chronic systolic (congestive) heart failure: Secondary | ICD-10-CM

## 2015-12-21 DIAGNOSIS — Z95811 Presence of heart assist device: Secondary | ICD-10-CM

## 2015-12-21 DIAGNOSIS — I6312 Cerebral infarction due to embolism of basilar artery: Secondary | ICD-10-CM

## 2015-12-21 DIAGNOSIS — I471 Supraventricular tachycardia: Secondary | ICD-10-CM

## 2015-12-21 DIAGNOSIS — I5023 Acute on chronic systolic (congestive) heart failure: Secondary | ICD-10-CM

## 2015-12-21 DIAGNOSIS — I10 Essential (primary) hypertension: Secondary | ICD-10-CM

## 2015-12-21 MED ORDER — HYDRALAZINE HCL 50 MG PO TABS
50.0000 mg | ORAL_TABLET | Freq: Three times a day (TID) | ORAL | Status: DC
Start: 2015-12-21 — End: 2015-12-25

## 2015-12-25 ENCOUNTER — Other Ambulatory Visit (HOSPITAL_COMMUNITY): Payer: Self-pay | Admitting: *Deleted

## 2015-12-25 DIAGNOSIS — I5023 Acute on chronic systolic (congestive) heart failure: Secondary | ICD-10-CM

## 2015-12-25 DIAGNOSIS — I471 Supraventricular tachycardia: Secondary | ICD-10-CM

## 2015-12-25 DIAGNOSIS — I6312 Cerebral infarction due to embolism of basilar artery: Secondary | ICD-10-CM

## 2015-12-25 DIAGNOSIS — I10 Essential (primary) hypertension: Secondary | ICD-10-CM

## 2015-12-25 DIAGNOSIS — Z95811 Presence of heart assist device: Secondary | ICD-10-CM

## 2015-12-25 DIAGNOSIS — I5022 Chronic systolic (congestive) heart failure: Secondary | ICD-10-CM

## 2015-12-25 MED ORDER — HYDRALAZINE HCL 50 MG PO TABS: 50.0000 mg | ORAL_TABLET | Freq: Three times a day (TID) | ORAL | Status: AC

## 2015-12-29 ENCOUNTER — Encounter (HOSPITAL_COMMUNITY): Payer: Self-pay | Admitting: Infectious Diseases

## 2015-12-29 ENCOUNTER — Other Ambulatory Visit (HOSPITAL_COMMUNITY): Payer: Self-pay | Admitting: Unknown Physician Specialty

## 2015-12-29 ENCOUNTER — Ambulatory Visit (HOSPITAL_COMMUNITY): Payer: Self-pay | Admitting: Infectious Diseases

## 2015-12-29 ENCOUNTER — Ambulatory Visit (HOSPITAL_COMMUNITY)
Admission: RE | Admit: 2015-12-29 | Discharge: 2015-12-29 | Disposition: A | Discharge status: Home / Self Care | Payer: Medicare Other | Source: Ambulatory Visit | Attending: Internal Medicine | Admitting: Internal Medicine

## 2015-12-29 DIAGNOSIS — Z7901 Long term (current) use of anticoagulants: Secondary | ICD-10-CM | POA: Diagnosis not present

## 2015-12-29 DIAGNOSIS — Z95811 Presence of heart assist device: Secondary | ICD-10-CM | POA: Insufficient documentation

## 2015-12-29 LAB — CBC
HEMATOCRIT: 42.1 % (ref 39.0–52.0)
HEMOGLOBIN: 13.9 g/dL (ref 13.0–17.0)
MCH: 30.2 pg (ref 26.0–34.0)
MCHC: 33 g/dL (ref 30.0–36.0)
MCV: 91.5 fL (ref 78.0–100.0)
Platelets: 201 10*3/uL (ref 150–400)
RBC: 4.6 MIL/uL (ref 4.22–5.81)
RDW: 13.8 % (ref 11.5–15.5)
WBC: 4.2 10*3/uL (ref 4.0–10.5)

## 2015-12-29 LAB — BASIC METABOLIC PANEL
ANION GAP: 7 (ref 5–15)
BUN: 14 mg/dL (ref 6–20)
CALCIUM: 9.2 mg/dL (ref 8.9–10.3)
CO2: 26 mmol/L (ref 22–32)
Chloride: 105 mmol/L (ref 101–111)
Creatinine, Ser: 1.17 mg/dL (ref 0.61–1.24)
GLUCOSE: 92 mg/dL (ref 65–99)
POTASSIUM: 4 mmol/L (ref 3.5–5.1)
Sodium: 138 mmol/L (ref 135–145)

## 2015-12-29 LAB — PROTIME-INR
INR: 3.84 — AB (ref 0.00–1.49)
PROTHROMBIN TIME: 36.9 s — AB (ref 11.6–15.2)

## 2015-12-29 LAB — LACTATE DEHYDROGENASE: LDH: 292 U/L — AB (ref 98–192)

## 2016-01-08 ENCOUNTER — Ambulatory Visit (HOSPITAL_COMMUNITY): Payer: Self-pay | Admitting: *Deleted

## 2016-01-08 ENCOUNTER — Other Ambulatory Visit (HOSPITAL_COMMUNITY): Payer: Self-pay | Admitting: *Deleted

## 2016-01-08 ENCOUNTER — Encounter (HOSPITAL_COMMUNITY): Payer: Self-pay | Admitting: *Deleted

## 2016-01-08 ENCOUNTER — Other Ambulatory Visit (HOSPITAL_COMMUNITY): Payer: Self-pay | Admitting: Unknown Physician Specialty

## 2016-01-08 ENCOUNTER — Ambulatory Visit (HOSPITAL_COMMUNITY)
Admission: RE | Admit: 2016-01-08 | Discharge: 2016-01-08 | Disposition: A | Discharge status: Home / Self Care | Payer: Medicare Other | Source: Ambulatory Visit | Attending: Cardiology | Admitting: Cardiology

## 2016-01-08 DIAGNOSIS — Z95811 Presence of heart assist device: Secondary | ICD-10-CM

## 2016-01-08 DIAGNOSIS — Z7901 Long term (current) use of anticoagulants: Secondary | ICD-10-CM

## 2016-01-08 LAB — PROTIME-INR
INR: 2.12 — ABNORMAL HIGH (ref 0.00–1.49)
Prothrombin Time: 23.6 seconds — ABNORMAL HIGH (ref 11.6–15.2)

## 2016-01-25 ENCOUNTER — Ambulatory Visit (HOSPITAL_COMMUNITY)
Admission: RE | Admit: 2016-01-25 | Discharge: 2016-01-25 | Disposition: A | Discharge status: Home / Self Care | Payer: Medicare Other | Source: Ambulatory Visit | Attending: Internal Medicine | Admitting: Internal Medicine

## 2016-01-25 ENCOUNTER — Ambulatory Visit (HOSPITAL_COMMUNITY): Payer: Self-pay | Admitting: *Deleted

## 2016-01-25 DIAGNOSIS — I5023 Acute on chronic systolic (congestive) heart failure: Secondary | ICD-10-CM | POA: Diagnosis not present

## 2016-01-25 DIAGNOSIS — Z7901 Long term (current) use of anticoagulants: Secondary | ICD-10-CM | POA: Insufficient documentation

## 2016-01-25 DIAGNOSIS — Z5181 Encounter for therapeutic drug level monitoring: Secondary | ICD-10-CM | POA: Diagnosis present

## 2016-01-25 DIAGNOSIS — Z95811 Presence of heart assist device: Secondary | ICD-10-CM | POA: Insufficient documentation

## 2016-01-25 LAB — PROTIME-INR
INR: 3.39
Prothrombin Time: 35 seconds — ABNORMAL HIGH (ref 11.4–15.2)

## 2016-02-12 ENCOUNTER — Other Ambulatory Visit (HOSPITAL_COMMUNITY): Payer: Self-pay | Admitting: Unknown Physician Specialty

## 2016-02-12 DIAGNOSIS — Z95811 Presence of heart assist device: Secondary | ICD-10-CM

## 2016-02-12 DIAGNOSIS — Z7901 Long term (current) use of anticoagulants: Secondary | ICD-10-CM

## 2016-02-13 ENCOUNTER — Ambulatory Visit (HOSPITAL_COMMUNITY): Payer: Self-pay | Admitting: Unknown Physician Specialty

## 2016-02-13 ENCOUNTER — Encounter (HOSPITAL_COMMUNITY): Payer: Self-pay | Admitting: Unknown Physician Specialty

## 2016-02-13 ENCOUNTER — Ambulatory Visit (HOSPITAL_COMMUNITY)
Admission: RE | Admit: 2016-02-13 | Discharge: 2016-02-13 | Disposition: A | Discharge status: Home / Self Care | Payer: Medicare Other | Source: Ambulatory Visit | Attending: Cardiology | Admitting: Cardiology

## 2016-02-13 DIAGNOSIS — Z7902 Long term (current) use of antithrombotics/antiplatelets: Secondary | ICD-10-CM | POA: Diagnosis not present

## 2016-02-13 DIAGNOSIS — I130 Hypertensive heart and chronic kidney disease with heart failure and stage 1 through stage 4 chronic kidney disease, or unspecified chronic kidney disease: Secondary | ICD-10-CM | POA: Insufficient documentation

## 2016-02-13 DIAGNOSIS — Z95811 Presence of heart assist device: Secondary | ICD-10-CM | POA: Diagnosis not present

## 2016-02-13 DIAGNOSIS — Z7982 Long term (current) use of aspirin: Secondary | ICD-10-CM | POA: Diagnosis not present

## 2016-02-13 DIAGNOSIS — Z95810 Presence of automatic (implantable) cardiac defibrillator: Secondary | ICD-10-CM | POA: Diagnosis not present

## 2016-02-13 DIAGNOSIS — I5022 Chronic systolic (congestive) heart failure: Secondary | ICD-10-CM

## 2016-02-13 DIAGNOSIS — Z8249 Family history of ischemic heart disease and other diseases of the circulatory system: Secondary | ICD-10-CM | POA: Diagnosis not present

## 2016-02-13 DIAGNOSIS — I252 Old myocardial infarction: Secondary | ICD-10-CM | POA: Diagnosis not present

## 2016-02-13 DIAGNOSIS — N189 Chronic kidney disease, unspecified: Secondary | ICD-10-CM | POA: Diagnosis not present

## 2016-02-13 DIAGNOSIS — I6312 Cerebral infarction due to embolism of basilar artery: Secondary | ICD-10-CM

## 2016-02-13 DIAGNOSIS — Z8639 Personal history of other endocrine, nutritional and metabolic disease: Secondary | ICD-10-CM | POA: Insufficient documentation

## 2016-02-13 DIAGNOSIS — K219 Gastro-esophageal reflux disease without esophagitis: Secondary | ICD-10-CM | POA: Insufficient documentation

## 2016-02-13 DIAGNOSIS — Z7901 Long term (current) use of anticoagulants: Secondary | ICD-10-CM

## 2016-02-13 DIAGNOSIS — I428 Other cardiomyopathies: Secondary | ICD-10-CM | POA: Insufficient documentation

## 2016-02-13 DIAGNOSIS — I69354 Hemiplegia and hemiparesis following cerebral infarction affecting left non-dominant side: Secondary | ICD-10-CM | POA: Insufficient documentation

## 2016-02-13 LAB — BASIC METABOLIC PANEL
ANION GAP: 9 (ref 5–15)
BUN: 18 mg/dL (ref 6–20)
CO2: 22 mmol/L (ref 22–32)
Calcium: 9.3 mg/dL (ref 8.9–10.3)
Chloride: 104 mmol/L (ref 101–111)
Creatinine, Ser: 1.13 mg/dL (ref 0.61–1.24)
GFR calc Af Amer: >60 mL/min (ref >60)
GLUCOSE: 97 mg/dL (ref 65–99)
POTASSIUM: 4.1 mmol/L (ref 3.5–5.1)
Sodium: 135 mmol/L (ref 135–145)

## 2016-02-13 LAB — CBC
HCT: 41.3 % (ref 39.0–52.0)
Hemoglobin: 13.8 g/dL (ref 13.0–17.0)
MCH: 30.8 pg (ref 26.0–34.0)
MCHC: 33.4 g/dL (ref 30.0–36.0)
MCV: 92.2 fL (ref 78.0–100.0)
PLATELETS: 202 10*3/uL (ref 150–400)
RBC: 4.48 MIL/uL (ref 4.22–5.81)
RDW: 13.9 % (ref 11.5–15.5)
WBC: 3.5 10*3/uL — AB (ref 4.0–10.5)

## 2016-02-13 LAB — PROTIME-INR
INR: 3.03
PROTHROMBIN TIME: 32 s — AB (ref 11.4–15.2)

## 2016-02-13 LAB — LACTATE DEHYDROGENASE: LDH: 398 U/L — ABNORMAL HIGH (ref 98–192)

## 2016-02-13 MED ORDER — ASPIRIN 81 MG PO CHEW: 81.0000 mg | CHEWABLE_TABLET | Freq: Every day | ORAL | 12 refills | Status: AC

## 2016-02-13 NOTE — Progress Notes (Signed)
. Symptom  Yes  No  Details   Angina         x Activity:   Claudication         x How far:    Syncope         x When:    Stroke        x  Jan 2015; Left sided residual weakness   Orthopnea         x How many pillows: one; sleeps flat   PND               x How often:   CPAP      N/A How many hrs:   Pedal edema               x   Abd fullness               x   N&V         x Good appetite; eating full meal   Diaphoresis         x When:  Bleeding        x   Urine color    light yellow  SOB        x        Activity: Incline  Palpitations         x When:  ICD shock         x   Hospitlizaitons                x When/where/why:    ED visit         x When/where/why:  Other MD         x When/who/why:  Activity           Unemployed and not much activity   Fluid           No limitations   Diet     No limitations, eating less salt    Vital signs: HR:  81 Doppler MAP: 92 Auto cuff: 95/73 (82)  O2 Sat: 96% Wt: 189.2 lbs    Last wt: 187.2lbs     Ht: 5'4"  LVAD interrogation reveals:  Speed:  8600 Flow: 3.4 Power: 4.1w PI: 6.4  Alarms: rare brief LOW FLOW during night while sleeping; pt states that he did not hear an alarm during the night; LOW FLOW X 1 ON 8/21 at 0142. And then multiple LOW VOLTAGE alarams.   Events: 30 - 40 PI events daily  **2016 CT of Chest Reports--The position of the inflow cannula is very angulated, with tip directed toward the apical anterior wall. Positional events-->encouraged him to turn to another position when these alarms occur.    Fixed speed:  8600 Low speed limit: 8000  Primary Controller:  Replace back up battery in 36 months [id number] Back up controller:   Replace back up battery in  27 months;  PC [id number] Retiring controller [phone]. Controller has multiple breaks in the cord with the wires showing through.   Controller Change Out:  HeartMate II System Controller SW Version 7.29 upgrade performed in clinic today to accommodate new  recommendations from [employer]. Consent reviewed and signed. Tem Tech representative Scharlene Corn(Thom) performed software upgrade.   VAD Team performed elective controller exchange. Patient tolerated controller exchange well and was asymptomatic. Pump restarted as expected with stable VAD parameters on correct prescribed speed of 8600/8000 rpms.    Primary: [id number]  Back up: [id number]  Both back  up batteries have 27 m until replacement of 11V battery.   LVAD Exit Site: VAD dressing appeared to have moderate amount of dried blood underneath dressing. Approximately 1 - 2 cm of velour remains outside of exit site with old bloody drainage noted. Dressing was removed and cleaned with chlorapreps x 2, allowed to dry, biopatch and sorbaview dressing applied, along with anchor device.  Pt denies fever or chills. Driveline dressing is being changed weekly per caregiver (pt performs rarely). Dressing supplies provided for home use.     Clinic Encounter:  Presents to clinic today for follow up OV; accompanied by his Mom (primary caregiver now). Peripheral swelling improved; taking only 20 mg torsemide on MWF and only taking 20 mEq KCl on MWF.   INR 3.03 today-- goal 2.5 - 3.0 with ASA 81 and Plavix 75 mg. See anticoag note.   MAP 92 on current regimen.   4 year Intermacs follow up completed including:  Quality of Life, KCCQ-12, and Neurocognitive trail making. ( 57sec)   Pt completed 600 feet during 6 minute walk.  Back up controller:  11V backup battery charged during this visit.   Patient Instructions: 1. Return for INR in 2 weeks. 2. Return to clinic in 2 months.   Carlton Adam, RN, BSN VAD Coordinator  Office: [phone] 24/7 VAD Pager: [phone]

## 2016-02-13 NOTE — Progress Notes (Signed)
CSW referred to assist with equipment needs. Patient is on CAP program which would cover the expense of needed medical equipment. Patient states he needs a shower chair and grab bar to assist with his showers. CSW will assist with prescription and follow up with CAP worker to obtain equipment. Patient states his CAP worker is Mr. Delight Ovens [phone] who is on vacation this week. CSW left message for return call with CAP worker. CSW will await return call and follow up as needed. Lasandra Beech, LCSW [phone]

## 2016-02-14 NOTE — Progress Notes (Signed)
Patient ID: [name] Tullier, male   DOB: 09/29/1960, 55 y.o.   MRN: [id number]   ADVANCED HF CLINIC NOTE  Patient ID: [name] Sick, male   DOB: 11/13/1960, 55 y.o.   MRN: [id number]  HPI: [name] is a 55 yo male with a history of severe CHF, NICM s/p LVAD and TVR (09/2011), VT, NSTEMI, LV thrombus, CKD, GERD, stroke and LVAD hemolysis with LVAD exchange (08/2013).   He was [facility] to [facility] 2015 with NSTEMI and RV failure. He had a difficult course that was complicated by embolic right CVA. He was intubated and eventually had a tracheostomy placed 07/28/13. He was transferred to inpatient rehab for extensive rehab and was making progression, however despite aggressive therapy with heparin, coumadin, ASA and Plavix, his LDH remained persistently elevated in the 950 range (was about 1750 on admit). He did not experience any pump dysfunction, however the case was discussed with Dr Allena KatzPatel and Romona CurlsMilano at [facility] and it was felt to transfer him down there for possible pump exchange. He underwent a pump exchange 08/31/13. Post op was complicated by bleeding and he received multiple transfusions. He had AKI and had intermittent HD.  Admitted 5/13-5/18/15 for volume overload and possible hemolysis. LDH 647 and INR 1.69 on admission. Treated with IV lasix and IV heparin. While in the hospital developed some PI events and speed was decreased to 9400. ICD interrogated and showed A flutter and he was placed on IV amiodarone and then transitioned to 200 mg PO BID. He cardioverted to NSR on 5/15. LDH trended down and on discharge was 530 and INR 2.4.  In January 2016 admitted with multiple low flow alarms. Diuretics held and given IVF with improvement. Echo showed severe RV dysfunction  Admitted May 17,2016 from HF.LVAD clinic with volume overload. Diuresed with IV lasix.Multiple low flow alarms particularly in the am felt to be positional. Discharge weight was 175 pounds.    Ramp echo in 7/16, decreased speed to 8600 rpm.    Follow up for Heart Failure and LVAD:  Doing well. He has been much more compliant with medicines and INR follow-up. Good appetite. Weight stable.  Takes torsemide 20 mg once daily.  Denies dyspnea with his usual activities.  No orthopnea/PND or edema.  No bleeding. No lightheadedness.  No longer working at [employer]. Mild bleeding at driveline site recently due to tug on driveline. No infectious sx. Driveline cable with multiple twists in it,   Denies driveline trauma, erythema or drainage.  Denies ICD shocks.   Reports taking Coumadin as prescribed and adherence to anticoagulation based dietary restrictions.  Denies bright red blood per rectum or melena, no dark urine or hematuria.    LVAD interrogation reveals:  Speed:  8600 Flow: 3.4 Power: 4.1w PI: 6.4  Alarms: rare brief LOW FLOW during night while sleeping; pt states that he did not hear an alarm during the night; LOW FLOW X 1 ON 8/21 at 0142. And then multiple LOW VOLTAGE alarams.   Events: 30 - 40 PI events daily  **2016 CT of Chest Reports--The position of the inflow cannula is very angulated, with tip directed toward the apical anterior wall. Positional events-->encouraged him to turn to another position when these alarms occur.                         Fixed speed:  8600 Low speed limit: 8000 **2016 CT of Chest Reports--The position of the inflow cannula is very angulated, with tip  directed toward the apical anterior wall. Positional events-->encouraged him to turn to another position when these alarms occur.   Fixed speed: 8600 Low speed limit: 8000  Primary Controller: Replace back up battery in 11 months [id number] Back up controller: Replace back up battery in 34 months; PC [id number]  SH: Disabled, lives at home with caregiver. No ETOH or smoking  FH: Mother living; HTN        Father living; no health issues    Past Medical History:  Diagnosis Date  . AICD (automatic cardioverter/defibrillator)  present   . Bipolar affective disorder (HCC) 10/22/2011   pt denies this hx 02/26/2012  . CHF (congestive heart failure) (HCC)    EF- 10-15  . Chronic renal insufficiency   . COMMON MIGRAINE 06/14/2009   Qualifier: Diagnosis of  By: Jonny Ruiz MD, Len Blalock   . Depression 08/11/2013   Pt denies  . Exertional dyspnea 02/26/2012  . GERD (gastroesophageal reflux disease)   . Hepatomegaly 09/16/2008   Qualifier: Diagnosis of  By: Wonda Amis    . High cholesterol 02/26/2012   "at one time"  . History of blood transfusion 08/2011   "when I had heart pump"  . History of gout 02/26/2012  . HTN (hypertension)   . ICD - IN SITU 09/16/2008   Qualifier: Diagnosis of  By: Wonda Amis    . LV (left ventricular) mural thrombus (HCC) 01/28/2011  . Medically noncompliant   . Migraines   . Mitral regurgitation   . MITRAL STENOSIS/ INSUFFICIENCY, NON-RHEUMATIC 09/22/2008   Qualifier: Diagnosis of  By: Gala Romney, MD, Trixie Dredge Sleep apnea   . Substance abuse   . Syncope   . SYSTOLIC HEART FAILURE, CHRONIC 09/22/2008   Qualifier: Diagnosis of  By: Gala Romney, MD, Trixie Dredge Thrombus 08/06/2010  . Tobacco user     Current Outpatient Prescriptions  Medication Sig Dispense Refill  . aspirin 81 MG chewable tablet Chew 1 tablet (81 mg total) by mouth daily. 30 tablet 12  . atorvastatin (LIPITOR) 40 MG tablet Take 1 tablet (40 mg total) by mouth daily. 90 tablet 3  . carvedilol (COREG) 3.125 MG tablet Take 1 tablet (3.125 mg total) by mouth 2 (two) times daily with a meal. 90 tablet 3  . clopidogrel (PLAVIX) 75 MG tablet Take 1 tablet (75 mg total) by mouth daily with breakfast. 30 tablet 6  . hydrALAZINE (APRESOLINE) 50 MG tablet Take 1 tablet (50 mg total) by mouth 3 (three) times daily. 270 tablet 3  . KLOR-CON M20 20 MEQ tablet TAKE 2 TABLETS BY MOUTH TWICE A DAY (Patient taking differently: taking 1 tablet per day) 60 tablet 4  . losartan (COZAAR) 100 MG tablet Take 1 tablet (100 mg  total) by mouth daily. 90 tablet 3  . torsemide (DEMADEX) 20 MG tablet Take 2 tablets (40 mg total) by mouth once. (Patient taking differently: Take 20 mg by mouth once. ) 180 tablet 3  . warfarin (COUMADIN) 4 MG tablet TAKE ONE TABLETONCE DAILY OR AS INSTRUCTED FOR INR 2.0 - 3.0 140 tablet 1  . metolazone (ZAROXOLYN) 2.5 MG tablet Take 1 tablet (2.5 mg total) by mouth as needed (When MD tells you to take it). (Patient not taking: Reported on 10/12/2015) 10 tablet 3  . tamsulosin (FLOMAX) 0.4 MG CAPS capsule Take 1 capsule (0.4 mg total) by mouth daily. (Patient not taking: Reported on 02/13/2016) 90 capsule 3   No current facility-administered medications  for this encounter.    Facility-Administered Medications Ordered in Other Encounters  Medication Dose Route Frequency Provider Last Rate Last Dose  . sodium chloride 0.9 % injection 10-40 mL  10-40 mL Intracatheter PRN [name], MD        Ace inhibitors and Lexapro [escitalopram oxalate]  REVIEW OF SYSTEMS: All systems negative except as listed in HPI, PMH and Problem list.  Vital signs: HR:  81 Doppler MAP: 92 Auto cuff: 95/73 (82)  O2 Sat: 96% Wt: 189.2 lbs    Last wt: 187.2lbs     Ht: 5'4"  Physical Exam: GENERAL: Well appearing, male who presents to clinic today HEENT: normal  NECK: Supple, JVP 7 prominent superficial veins on R (suspect occluded IJ)  No lymphadenopathy or thyromegaly appreciated.   CARDIAC:  Mechanical heart sounds with LVAD hum present, prominent heart sounds LUNGS:  Clear to auscultation bilaterally.  ABDOMEN:  Soft, round, mildly distended, positive bowel sounds x4.     LVAD exit site: Drive line out 1 cm with exposed velour (no change). Dried blood around site. No erythema or exudate.  Stabilization device present and accurately applied. Dressing being changed weekly. Driveline twisted.  EXTREMITIES:  Warm and dry, no cyanosis, clubbing, rash, edema NEUROLOGIC:  Alert and oriented x 4.  L-sided  weakness. Gait steady.  No aphasia.  Affect pleasant.      ASSESSMENT AND PLAN:   1) Chronic systolic HF: NICM, s/p LVAD implant (08/2011) and subsequent pump exchange (08/2013).  Stable NYHA II - Volume status looks good. Continue current regimen. Congratulated him on ongoing compliance with regimen. Reinforced need for daily weights and reviewed use of sliding scale diuretics. 2) LVAD, s/p (08/2011) and then pump exchange (08/2013) due to pump thrombosis.  VAD interrogated personally. No alarms, overall stable. Still with some positional low flow alarms while lying in bed.  - Not a transplant candidate due to previous compliance issues and debility from CVA.  3) HTN: MAPs look good. Continue current regimen.  4) CVA: Stable.Continues to have mild left sided weakness but functions well . No longer in therapy. Continue atorvastatin 40 mg daily.  6) h/o Atrial Flutter: now in NSR 7) Anticoagulation management: INR Goal 2.5-3.0. Will check INR today and adjust accordingly. He is on ASA 325 daily as well along with Plavix given history of CVA. He has been more compliant with anti-coag f/u and we congratulated him on this.   8) RV failure: stable 9) Driveline trauma/twisting: Dressing changesd today and looks good. Will watch closely for signs in infection. Controller changed in Clinic today,  Loy Little,MD 02/14/2016

## 2016-02-22 ENCOUNTER — Other Ambulatory Visit (HOSPITAL_COMMUNITY): Payer: Self-pay | Admitting: *Deleted

## 2016-02-22 DIAGNOSIS — Z7901 Long term (current) use of anticoagulants: Secondary | ICD-10-CM

## 2016-02-22 DIAGNOSIS — Z95811 Presence of heart assist device: Secondary | ICD-10-CM

## 2016-02-27 ENCOUNTER — Ambulatory Visit (HOSPITAL_COMMUNITY)
Admission: RE | Admit: 2016-02-27 | Discharge: 2016-02-27 | Disposition: A | Discharge status: Home / Self Care | Payer: Medicare Other | Source: Ambulatory Visit | Attending: Cardiology | Admitting: Cardiology

## 2016-02-27 ENCOUNTER — Ambulatory Visit (HOSPITAL_COMMUNITY): Payer: Self-pay | Admitting: Unknown Physician Specialty

## 2016-02-27 DIAGNOSIS — Z5181 Encounter for therapeutic drug level monitoring: Secondary | ICD-10-CM | POA: Insufficient documentation

## 2016-02-27 DIAGNOSIS — Z7901 Long term (current) use of anticoagulants: Secondary | ICD-10-CM

## 2016-02-27 DIAGNOSIS — Z95811 Presence of heart assist device: Secondary | ICD-10-CM | POA: Diagnosis not present

## 2016-02-27 LAB — PROTIME-INR
INR: 3.11
PROTHROMBIN TIME: 32.7 s — AB (ref 11.4–15.2)

## 2016-02-28 ENCOUNTER — Encounter (HOSPITAL_COMMUNITY): Payer: Self-pay | Admitting: Unknown Physician Specialty

## 2016-03-13 ENCOUNTER — Ambulatory Visit (HOSPITAL_COMMUNITY): Payer: Self-pay | Admitting: *Deleted

## 2016-03-13 ENCOUNTER — Ambulatory Visit (HOSPITAL_COMMUNITY)
Admission: RE | Admit: 2016-03-13 | Discharge: 2016-03-13 | Disposition: A | Discharge status: Home / Self Care | Payer: Medicare Other | Source: Ambulatory Visit | Attending: Internal Medicine | Admitting: Internal Medicine

## 2016-03-13 ENCOUNTER — Other Ambulatory Visit (HOSPITAL_COMMUNITY): Payer: Self-pay | Admitting: *Deleted

## 2016-03-13 ENCOUNTER — Encounter (HOSPITAL_COMMUNITY): Payer: Self-pay | Admitting: *Deleted

## 2016-03-13 DIAGNOSIS — Z7901 Long term (current) use of anticoagulants: Secondary | ICD-10-CM | POA: Diagnosis present

## 2016-03-13 DIAGNOSIS — Z95811 Presence of heart assist device: Secondary | ICD-10-CM

## 2016-03-13 LAB — PROTIME-INR
INR: 2.99
PROTHROMBIN TIME: 31.7 s — AB (ref 11.4–15.2)

## 2016-03-22 ENCOUNTER — Other Ambulatory Visit (HOSPITAL_COMMUNITY): Payer: Self-pay | Admitting: Internal Medicine

## 2016-03-28 ENCOUNTER — Other Ambulatory Visit (HOSPITAL_COMMUNITY): Payer: Self-pay | Admitting: *Deleted

## 2016-03-28 ENCOUNTER — Telehealth (HOSPITAL_COMMUNITY): Payer: Self-pay | Admitting: *Deleted

## 2016-03-28 DIAGNOSIS — Z95811 Presence of heart assist device: Secondary | ICD-10-CM

## 2016-03-28 DIAGNOSIS — Z7901 Long term (current) use of anticoagulants: Secondary | ICD-10-CM

## 2016-03-28 NOTE — Telephone Encounter (Signed)
Called pt re: missed INR appt yesterday; pt will come to VAD clinic for lab tomorrow.

## 2016-03-29 ENCOUNTER — Other Ambulatory Visit (HOSPITAL_COMMUNITY): Payer: Self-pay | Admitting: *Deleted

## 2016-03-29 ENCOUNTER — Ambulatory Visit (HOSPITAL_COMMUNITY): Payer: Self-pay | Admitting: Infectious Diseases

## 2016-03-29 ENCOUNTER — Other Ambulatory Visit (HOSPITAL_COMMUNITY): Payer: Medicare Other

## 2016-03-29 ENCOUNTER — Ambulatory Visit (HOSPITAL_COMMUNITY)
Admission: RE | Admit: 2016-03-29 | Discharge: 2016-03-29 | Disposition: A | Discharge status: Home / Self Care | Payer: Medicare Other | Source: Ambulatory Visit | Attending: Cardiology | Admitting: Cardiology

## 2016-03-29 DIAGNOSIS — Z7901 Long term (current) use of anticoagulants: Secondary | ICD-10-CM | POA: Diagnosis present

## 2016-03-29 DIAGNOSIS — I5023 Acute on chronic systolic (congestive) heart failure: Secondary | ICD-10-CM

## 2016-03-29 DIAGNOSIS — Z95811 Presence of heart assist device: Secondary | ICD-10-CM

## 2016-03-29 LAB — PROTIME-INR
INR: 3.15
Prothrombin Time: 33 seconds — ABNORMAL HIGH (ref 11.4–15.2)

## 2016-03-29 MED ORDER — WARFARIN SODIUM 4 MG PO TABS: ORAL_TABLET | ORAL | 1 refills | Status: DC

## 2016-04-15 ENCOUNTER — Other Ambulatory Visit: Payer: Self-pay | Admitting: Infectious Diseases

## 2016-04-15 DIAGNOSIS — Z95811 Presence of heart assist device: Secondary | ICD-10-CM

## 2016-04-15 DIAGNOSIS — I5022 Chronic systolic (congestive) heart failure: Secondary | ICD-10-CM

## 2016-04-15 DIAGNOSIS — Z7901 Long term (current) use of anticoagulants: Secondary | ICD-10-CM

## 2016-04-16 ENCOUNTER — Ambulatory Visit (HOSPITAL_COMMUNITY): Payer: Self-pay | Admitting: Pharmacist

## 2016-04-16 ENCOUNTER — Encounter (HOSPITAL_COMMUNITY): Payer: Self-pay

## 2016-04-16 ENCOUNTER — Ambulatory Visit (HOSPITAL_COMMUNITY)
Admission: RE | Admit: 2016-04-16 | Discharge: 2016-04-16 | Disposition: A | Discharge status: Home / Self Care | Payer: Medicare Other | Source: Ambulatory Visit | Attending: Internal Medicine | Admitting: Internal Medicine

## 2016-04-16 ENCOUNTER — Telehealth (HOSPITAL_COMMUNITY): Payer: Self-pay

## 2016-04-16 ENCOUNTER — Ambulatory Visit (HOSPITAL_COMMUNITY)
Admission: RE | Admit: 2016-04-16 | Discharge: 2016-04-16 | Disposition: A | Discharge status: Home / Self Care | Payer: Medicare Other | Source: Ambulatory Visit | Attending: Cardiology | Admitting: Cardiology

## 2016-04-16 VITALS — BP 82/46 | HR 77 | Wt 193.6 lb

## 2016-04-16 DIAGNOSIS — I130 Hypertensive heart and chronic kidney disease with heart failure and stage 1 through stage 4 chronic kidney disease, or unspecified chronic kidney disease: Secondary | ICD-10-CM | POA: Insufficient documentation

## 2016-04-16 DIAGNOSIS — Z7982 Long term (current) use of aspirin: Secondary | ICD-10-CM | POA: Diagnosis not present

## 2016-04-16 DIAGNOSIS — Z79899 Other long term (current) drug therapy: Secondary | ICD-10-CM | POA: Diagnosis not present

## 2016-04-16 DIAGNOSIS — I5022 Chronic systolic (congestive) heart failure: Secondary | ICD-10-CM | POA: Insufficient documentation

## 2016-04-16 DIAGNOSIS — Z7902 Long term (current) use of antithrombotics/antiplatelets: Secondary | ICD-10-CM | POA: Diagnosis not present

## 2016-04-16 DIAGNOSIS — T829XXD Unspecified complication of cardiac and vascular prosthetic device, implant and graft, subsequent encounter: Secondary | ICD-10-CM

## 2016-04-16 DIAGNOSIS — G4730 Sleep apnea, unspecified: Secondary | ICD-10-CM | POA: Insufficient documentation

## 2016-04-16 DIAGNOSIS — K219 Gastro-esophageal reflux disease without esophagitis: Secondary | ICD-10-CM | POA: Diagnosis not present

## 2016-04-16 DIAGNOSIS — N189 Chronic kidney disease, unspecified: Secondary | ICD-10-CM | POA: Insufficient documentation

## 2016-04-16 DIAGNOSIS — Z8673 Personal history of transient ischemic attack (TIA), and cerebral infarction without residual deficits: Secondary | ICD-10-CM | POA: Insufficient documentation

## 2016-04-16 DIAGNOSIS — I428 Other cardiomyopathies: Secondary | ICD-10-CM | POA: Insufficient documentation

## 2016-04-16 DIAGNOSIS — Z95811 Presence of heart assist device: Secondary | ICD-10-CM | POA: Insufficient documentation

## 2016-04-16 DIAGNOSIS — Z8249 Family history of ischemic heart disease and other diseases of the circulatory system: Secondary | ICD-10-CM | POA: Insufficient documentation

## 2016-04-16 DIAGNOSIS — Z7901 Long term (current) use of anticoagulants: Secondary | ICD-10-CM | POA: Insufficient documentation

## 2016-04-16 DIAGNOSIS — I252 Old myocardial infarction: Secondary | ICD-10-CM | POA: Diagnosis not present

## 2016-04-16 DIAGNOSIS — I5023 Acute on chronic systolic (congestive) heart failure: Secondary | ICD-10-CM

## 2016-04-16 LAB — BASIC METABOLIC PANEL
Anion gap: 7 (ref 5–15)
BUN: 14 mg/dL (ref 6–20)
CALCIUM: 9.5 mg/dL (ref 8.9–10.3)
CO2: 29 mmol/L (ref 22–32)
CREATININE: 1.27 mg/dL — AB (ref 0.61–1.24)
Chloride: 102 mmol/L (ref 101–111)
GFR calc non Af Amer: >60 mL/min (ref >60)
Glucose, Bld: 93 mg/dL (ref 65–99)
Potassium: 3.7 mmol/L (ref 3.5–5.1)
Sodium: 138 mmol/L (ref 135–145)

## 2016-04-16 LAB — CBC
HCT: 40.2 % (ref 39.0–52.0)
Hemoglobin: 13.3 g/dL (ref 13.0–17.0)
MCH: 30.2 pg (ref 26.0–34.0)
MCHC: 33.1 g/dL (ref 30.0–36.0)
MCV: 91.2 fL (ref 78.0–100.0)
PLATELETS: 209 10*3/uL (ref 150–400)
RBC: 4.41 MIL/uL (ref 4.22–5.81)
RDW: 14.1 % (ref 11.5–15.5)
WBC: 3.9 10*3/uL — ABNORMAL LOW (ref 4.0–10.5)

## 2016-04-16 LAB — PROTIME-INR
INR: 2.48
PROTHROMBIN TIME: 27.3 s — AB (ref 11.4–15.2)

## 2016-04-16 LAB — LACTATE DEHYDROGENASE: LDH: 384 U/L — ABNORMAL HIGH (ref 98–192)

## 2016-04-16 MED ORDER — ZOLPIDEM TARTRATE 5 MG PO TABS: 5.0000 mg | ORAL_TABLET | Freq: Every evening | ORAL | 0 refills | Status: DC | PRN

## 2016-04-16 NOTE — Patient Instructions (Signed)
Take extra Torsemide today  Take Ambien 5 mg at night as needed  Your physician recommends that you schedule a follow-up appointment in: 2 months

## 2016-04-16 NOTE — Progress Notes (Signed)
CSW met with patient and caregiver in the clinic. Patient reports he is doing well and denies any concerns. Patient states he would like to get a job but "no one is hiring". CSW encouraged patient to attend LVAD support group for support and socialization. Patient aware of next meeting and states possibility of attending. CSW offered support and will continue to be available as needed. Raquel Sarna, LCSW [phone]

## 2016-04-16 NOTE — Telephone Encounter (Signed)
Patient notified of next INR apt 05/01/16 at 11:00 am and that a reminder letter would be mailed to him today after verifying address in chart. Patient aware and agreeable.  Ave Filter, RN

## 2016-04-16 NOTE — Progress Notes (Signed)
Patient ID: [name], male   DOB: 16-Feb-1961, 55 y.o.   MRN: [id number]   ADVANCED HF CLINIC NOTE  Patient ID: [name], male   DOB: January 04, 1961, 55 y.o.   MRN: [id number]  HPI: [name] is a 55 yo male with a history of severe CHF, NICM s/p LVAD and TVR (09/2011), VT, NSTEMI, LV thrombus, CKD, GERD, stroke and LVAD hemolysis with LVAD exchange (08/2013).   He was admitted to [facility] January 2015 with NSTEMI and RV failure. He had a difficult course that was complicated by embolic right CVA. He was intubated and eventually had a tracheostomy placed 07/28/13. He was transferred to inpatient rehab for extensive rehab and was making progression, however despite aggressive therapy with heparin, coumadin, ASA and Plavix, his LDH remained persistently elevated in the 950 range (was about 1750 on admit). He did not experience any pump dysfunction, however the case was discussed with Dr Allena Katz and [name] at [facility] and it was felt to transfer him down there for possible pump exchange. He underwent a pump exchange 08/31/13. Post op was complicated by bleeding and he received multiple transfusions. He had AKI and had intermittent HD.  Admitted 5/13-5/18/15 for volume overload and possible hemolysis. LDH 647 and INR 1.69 on admission. Treated with IV lasix and IV heparin. While in the hospital developed some PI events and speed was decreased to 9400. ICD interrogated and showed A flutter and he was placed on IV amiodarone and then transitioned to 200 mg PO BID. He cardioverted to NSR on 5/15. LDH trended down and on discharge was 530 and INR 2.4.  In January 2016 admitted with multiple low flow alarms. Diuretics held and given IVF with improvement. Echo showed severe RV dysfunction  Admitted May 17,2016 from HF.LVAD clinic with volume overload. Diuresed with IV lasix.Multiple low flow alarms particularly in the am felt to be positional. Discharge weight was 175 pounds.    Ramp echo in 7/16, decreased speed to 8600 rpm.    Follow up for Heart Failure and LVAD:  Overall feeling ok. oing well. He has been much more compliant with medicines and INR follow-up. Good appetite. Eating out every night. Not weighting at home.No orthopnea/PND or edema.  No bleeding. No lightheadedness.  Complaining of leg edema and abdominal bloating. He is currently not working. He is performing his own dressing every week. Says he has some drainage around the drive line. Taking all medications but he is not sure which meds.   Denies driveline trauma. + Drainage.  Denies ICD shocks. Reports taking Coumadin as prescribed and adherence to anticoagulation based dietary restrictions.  Denies bright red blood per rectum or melena, no dark urine or hematuria.   LVAD interrogation reveals:  Speed:  8600 Flow: 4.0  Power: 4.4  PI: 5. 0  Alarms: No low flow alarms.  Events: 30 - 40 PI events daily  **2016 CT of Chest Reports--The position of the inflow cannula is very angulated, with tip directed toward the apical anterior wall. Positional events-->encouraged him to turn to another position when these alarms occur.                         Fixed speed:  8600 Low speed limit: 8000 **2016 CT of Chest Reports--The position of the inflow cannula is very angulated, with tip directed toward the apical anterior wall. Positional events-->encouraged him to turn to another position when these alarms occur.   Fixed speed: 8600 Low speed limit:  8000  Primary Controller: Replace back up battery in 19 months [id number] Back up controller: Replace back up battery in 32 months; PC [id number]  SH: Disabled, lives at home with caregiver. No ETOH or smoking  FH: Mother living; HTN        Father living; no health issues    Past Medical History:  Diagnosis Date  . AICD (automatic cardioverter/defibrillator) present   . Bipolar affective disorder (HCC) 10/22/2011   pt denies this hx 02/26/2012  . CHF (congestive heart failure) (HCC)    EF-  10-15  . Chronic renal insufficiency   . COMMON MIGRAINE 06/14/2009   Qualifier: Diagnosis of  By: Jonny RuizJohn MD, Len BlalockJames W   . Depression 08/11/2013   Pt denies  . Exertional dyspnea 02/26/2012  . GERD (gastroesophageal reflux disease)   . Hepatomegaly 09/16/2008   Qualifier: Diagnosis of  By: Wonda AmisBednar, NP-C, Michelle    . High cholesterol 02/26/2012   "at one time"  . History of blood transfusion 08/2011   "when I had heart pump"  . History of gout 02/26/2012  . HTN (hypertension)   . ICD - IN SITU 09/16/2008   Qualifier: Diagnosis of  By: Wonda AmisBednar, NP-C, Michelle    . LV (left ventricular) mural thrombus (HCC) 01/28/2011  . Medically noncompliant   . Migraines   . Mitral regurgitation   . MITRAL STENOSIS/ INSUFFICIENCY, NON-RHEUMATIC 09/22/2008   Qualifier: Diagnosis of  By: [name], MD, Trixie DredgeFACC, Clydine Parkison R   . Sleep apnea   . Substance abuse   . Syncope   . SYSTOLIC HEART FAILURE, CHRONIC 09/22/2008   Qualifier: Diagnosis of  By: [name], MD, Trixie DredgeFACC, Gertha Lichtenberg R   . Thrombus 08/06/2010  . Tobacco user     Current Outpatient Prescriptions  Medication Sig Dispense Refill  . aspirin 81 MG chewable tablet Chew 1 tablet (81 mg total) by mouth daily. 30 tablet 12  . atorvastatin (LIPITOR) 40 MG tablet Take 1 tablet (40 mg total) by mouth daily. 90 tablet 3  . carvedilol (COREG) 3.125 MG tablet Take 1 tablet (3.125 mg total) by mouth 2 (two) times daily with a meal. 90 tablet 3  . clopidogrel (PLAVIX) 75 MG tablet Take 1 tablet (75 mg total) by mouth daily with breakfast. 30 tablet 6  . hydrALAZINE (APRESOLINE) 50 MG tablet Take 1 tablet (50 mg total) by mouth 3 (three) times daily. 270 tablet 3  . losartan (COZAAR) 100 MG tablet Take 1 tablet (100 mg total) by mouth daily. 90 tablet 3  . potassium chloride SA (K-DUR,KLOR-CON) 20 MEQ tablet Take 20 mEq by mouth daily.    . tamsulosin (FLOMAX) 0.4 MG CAPS capsule Take 1 capsule (0.4 mg total) by mouth daily. 90 capsule 3  . torsemide (DEMADEX) 20 MG tablet Take  20 mg by mouth daily.    Marland Kitchen. warfarin (COUMADIN) 4 MG tablet TAKE ONE TABLETONCE DAILY OR AS INSTRUCTED FOR INR 2.0 - 3.0 140 tablet 1  . metolazone (ZAROXOLYN) 2.5 MG tablet Take 1 tablet (2.5 mg total) by mouth as needed (When MD tells you to take it). (Patient not taking: Reported on 04/16/2016) 10 tablet 3   No current facility-administered medications for this encounter.    Facility-Administered Medications Ordered in Other Encounters  Medication Dose Route Frequency Provider Last Rate Last Dose  . sodium chloride 0.9 % injection 10-40 mL  10-40 mL Intracatheter PRN Ranelle OysterZachary T Swartz, MD        Ace inhibitors and Lexapro [escitalopram oxalate]  REVIEW  OF SYSTEMS: All systems negative except as listed in HPI, PMH and Problem list.  Vital signs: HR:  77 Doppler MAP:86 Auto cuff: 82/46   O2 Sat: 96% Wt: 193 lbs    Last wt: 189 lbs     Ht: 5'4"  Physical Exam: GENERAL: Well appearing, male who presents to clinic today HEENT: normal  NECK: Supple, JVP 8-9 prominent superficial veins on R (suspect occluded IJ)  No lymphadenopathy or thyromegaly appreciated.   CARDIAC:  Mechanical heart sounds with LVAD hum present, prominent heart sounds LUNGS:  Clear to auscultation bilaterally.  ABDOMEN:  Soft, round, mildly distended, positive bowel sounds x4.     LVAD exit site: Velour exposed min tan exudate. Mild erythema. Non tender. Stabilization device present and accurately applied. Dressing being changed weekly. Driveline twisted.  EXTREMITIES:  Warm and dry, no cyanosis, clubbing, rash, R and LLE 1+ edema NEUROLOGIC:  Alert and oriented x 4.  L-sided weakness. Gait steady.  No aphasia.  Affect pleasant.      ASSESSMENT AND PLAN:   1) Chronic systolic HF: NICM, s/p LVAD implant (08/2011) and subsequent pump exchange (08/2013).  Stable NYHA II - Volume status mildly elevated. Continue current regimen and instructed to take extra 20 mg torsemide today.  Reinforced daily weights.  2) LVAD,  s/p (08/2011) and then pump exchange (08/2013) due to pump thrombosis.  VAD interrogated personally. No alarms, overall stable.  - Not a transplant candidate due to previous compliance issues and debility from CVA.  3) HTN: MAPs look good. Continue current regimen.  4) CVA: Stable.Continues to have mild left sided weakness but functions well . No longer in therapy. Continue atorvastatin 40 mg daily.  6) h/o Atrial Flutter: now in NSR 7) Anticoagulation management: INR Goal 2.5-3.0. Will check INR today and adjust accordingly. He is on ASA 325 daily as well along with Plavix given history of CVA.  8) RV failure: stable 9) Driveline trauma/twisting: Dressing was changed today. Min tan exudate. Wound culture obtained.   Labs today.CXR today for annual follow up. Follow up in 2 months.   Tonye Becket, NP-C  04/16/2016   Patient seen and examined with Tonye Becket, NP. We discussed all aspects of the encounter. I agree with the assessment and plan as stated above.   Overall doing well. He has some mild volume overload in setting of dietary indiscretion. Will have him take extra torsemide today and tomorrow as needed. Reinforced need for daily weights and reviewed use of sliding scale diuretics. He is being much more compliant with meds. Driveline with mild drainage but this is unchanged. No infections symptoms. We cultured this area in clinic. Will await culture data. VAD parameters stable.   Crystale Giannattasio,MD 3:18 PM

## 2016-04-17 ENCOUNTER — Encounter (HOSPITAL_COMMUNITY): Payer: Self-pay | Admitting: Infectious Diseases

## 2016-04-17 ENCOUNTER — Telehealth (HOSPITAL_COMMUNITY): Payer: Self-pay | Admitting: Infectious Diseases

## 2016-04-17 MED ORDER — DOXYCYCLINE HYCLATE 100 MG PO CAPS: 100.0000 mg | ORAL_CAPSULE | Freq: Two times a day (BID) | ORAL | 0 refills | Status: DC

## 2016-04-17 NOTE — Telephone Encounter (Addendum)
Called per Tonye Becket - would like for him to start taking Doxycycline 100 mg BID x 7d for driveline drainage. Also advised to start performing daily dressing changes while there is drainage from site. Reports he has gauze dressing kits at home. Instructed to come back Monday 04/22/16 @ 1030 for dressing here in clinic.

## 2016-04-19 LAB — AEROBIC CULTURE W GRAM STAIN (SUPERFICIAL SPECIMEN)

## 2016-04-19 LAB — AEROBIC CULTURE  (SUPERFICIAL SPECIMEN)

## 2016-04-20 ENCOUNTER — Other Ambulatory Visit (HOSPITAL_COMMUNITY): Payer: Self-pay | Admitting: Cardiology

## 2016-04-22 ENCOUNTER — Encounter (HOSPITAL_COMMUNITY): Payer: Medicare Other

## 2016-04-24 ENCOUNTER — Encounter (HOSPITAL_COMMUNITY): Payer: Self-pay | Admitting: Infectious Diseases

## 2016-04-24 ENCOUNTER — Telehealth (HOSPITAL_COMMUNITY): Payer: Self-pay | Admitting: Infectious Diseases

## 2016-04-24 MED ORDER — CEPHALEXIN 500 MG PO CAPS: 500.0000 mg | ORAL_CAPSULE | Freq: Three times a day (TID) | ORAL | 0 refills | Status: AC

## 2016-04-24 NOTE — Telephone Encounter (Signed)
Called patient to inform of missed appointment Monday. States he "changed the dressing himself and it is clean". Advised that based on the culture results Amy would like him to stop Doxycycline and start Keflex 500mg  TID x 7 days.   Also requested him to come Monday 04/29/16 @ 12:00 for INR and dressing change. Letter sent per patient request.   Rexene Alberts, RN VAD Coordinator   Office: [phone] 24/7 Emergency VAD Pager: [phone]

## 2016-04-24 NOTE — Progress Notes (Signed)
No show for dressing change this week. Patient called and appointment made for Monday 04/29/16. Reports he will not come this week and that he changed it Monday already on his own. See telephone note for further details. Letter sent.

## 2016-05-01 ENCOUNTER — Ambulatory Visit (HOSPITAL_COMMUNITY)
Admission: RE | Admit: 2016-05-01 | Discharge: 2016-05-01 | Disposition: A | Discharge status: Home / Self Care | Payer: Medicare Other | Source: Ambulatory Visit | Attending: Internal Medicine | Admitting: Internal Medicine

## 2016-05-01 ENCOUNTER — Other Ambulatory Visit (HOSPITAL_COMMUNITY): Payer: Self-pay

## 2016-05-01 ENCOUNTER — Ambulatory Visit (HOSPITAL_COMMUNITY): Payer: Self-pay | Admitting: Pharmacist

## 2016-05-01 DIAGNOSIS — Z95811 Presence of heart assist device: Secondary | ICD-10-CM | POA: Insufficient documentation

## 2016-05-01 LAB — PROTIME-INR
INR: 2.62
PROTHROMBIN TIME: 28.5 s — AB (ref 11.4–15.2)

## 2016-05-01 NOTE — Addendum Note (Signed)
Encounter addended by: Chyrl Civatte, RN on: 05/01/2016  2:50 PM<BR>    Actions taken: Sign clinical note

## 2016-05-01 NOTE — Progress Notes (Signed)
Sterile weekyl driveline dressing change completed. Coagulated blood present on biopatch, otherwise, no other drainage, swelling, redness, odor noted. Site cleaned with sterile saline gauze as patient has reaction to CHG swabs, sterile biopatch and tegaderm dressing applied. Patient refused anchor, educated on importance of keeping controller in harness and being mindful of driveline distal from dressing while ambulating/moving to avoid pulling driveline. Advised per VAD Coordinator Judeth Cornfield and Amy Clegg NP-C to go to twice weekly dressing changes with weekly kits on Wednesday and Saturdays. 8 weekly dressing kits supplied to patient. Ave Filter, RN

## 2016-05-15 ENCOUNTER — Other Ambulatory Visit (HOSPITAL_COMMUNITY): Payer: Self-pay

## 2016-05-15 ENCOUNTER — Ambulatory Visit (HOSPITAL_COMMUNITY): Payer: Self-pay | Admitting: Pharmacist

## 2016-05-15 ENCOUNTER — Ambulatory Visit (HOSPITAL_COMMUNITY)
Admission: RE | Admit: 2016-05-15 | Discharge: 2016-05-15 | Disposition: A | Discharge status: Home / Self Care | Payer: Medicare Other | Source: Ambulatory Visit | Attending: Internal Medicine | Admitting: Internal Medicine

## 2016-05-15 DIAGNOSIS — Z95811 Presence of heart assist device: Secondary | ICD-10-CM

## 2016-05-15 LAB — PROTIME-INR
INR: 3.22
Prothrombin Time: 33.6 seconds — ABNORMAL HIGH (ref 11.4–15.2)

## 2016-06-05 ENCOUNTER — Other Ambulatory Visit (HOSPITAL_COMMUNITY): Payer: Self-pay | Admitting: Cardiology

## 2016-06-05 ENCOUNTER — Encounter (HOSPITAL_COMMUNITY): Payer: Medicare Other

## 2016-06-05 DIAGNOSIS — I10 Essential (primary) hypertension: Secondary | ICD-10-CM

## 2016-06-05 DIAGNOSIS — I471 Supraventricular tachycardia, unspecified: Secondary | ICD-10-CM

## 2016-06-05 DIAGNOSIS — Z95811 Presence of heart assist device: Secondary | ICD-10-CM

## 2016-06-05 DIAGNOSIS — I6312 Cerebral infarction due to embolism of basilar artery: Secondary | ICD-10-CM

## 2016-06-05 DIAGNOSIS — I5023 Acute on chronic systolic (congestive) heart failure: Secondary | ICD-10-CM

## 2016-06-05 DIAGNOSIS — I5022 Chronic systolic (congestive) heart failure: Secondary | ICD-10-CM

## 2016-06-14 ENCOUNTER — Ambulatory Visit (HOSPITAL_COMMUNITY)
Admission: RE | Admit: 2016-06-14 | Discharge: 2016-06-14 | Disposition: A | Discharge status: Home / Self Care | Payer: Medicare Other | Source: Ambulatory Visit | Attending: Internal Medicine | Admitting: Internal Medicine

## 2016-06-14 ENCOUNTER — Ambulatory Visit (HOSPITAL_COMMUNITY): Payer: Self-pay | Admitting: Pharmacist

## 2016-06-14 VITALS — BP 147/76 | HR 74 | Wt 196.1 lb

## 2016-06-14 DIAGNOSIS — I130 Hypertensive heart and chronic kidney disease with heart failure and stage 1 through stage 4 chronic kidney disease, or unspecified chronic kidney disease: Secondary | ICD-10-CM | POA: Diagnosis not present

## 2016-06-14 DIAGNOSIS — G4700 Insomnia, unspecified: Secondary | ICD-10-CM | POA: Diagnosis not present

## 2016-06-14 DIAGNOSIS — K219 Gastro-esophageal reflux disease without esophagitis: Secondary | ICD-10-CM | POA: Diagnosis not present

## 2016-06-14 DIAGNOSIS — Z8673 Personal history of transient ischemic attack (TIA), and cerebral infarction without residual deficits: Secondary | ICD-10-CM | POA: Insufficient documentation

## 2016-06-14 DIAGNOSIS — Z7901 Long term (current) use of anticoagulants: Secondary | ICD-10-CM | POA: Insufficient documentation

## 2016-06-14 DIAGNOSIS — I6312 Cerebral infarction due to embolism of basilar artery: Secondary | ICD-10-CM | POA: Diagnosis not present

## 2016-06-14 DIAGNOSIS — I10 Essential (primary) hypertension: Secondary | ICD-10-CM | POA: Diagnosis not present

## 2016-06-14 DIAGNOSIS — I5022 Chronic systolic (congestive) heart failure: Secondary | ICD-10-CM | POA: Diagnosis not present

## 2016-06-14 DIAGNOSIS — R531 Weakness: Secondary | ICD-10-CM | POA: Diagnosis not present

## 2016-06-14 DIAGNOSIS — Z79899 Other long term (current) drug therapy: Secondary | ICD-10-CM | POA: Diagnosis not present

## 2016-06-14 DIAGNOSIS — Z7902 Long term (current) use of antithrombotics/antiplatelets: Secondary | ICD-10-CM | POA: Insufficient documentation

## 2016-06-14 DIAGNOSIS — I428 Other cardiomyopathies: Secondary | ICD-10-CM | POA: Insufficient documentation

## 2016-06-14 DIAGNOSIS — N189 Chronic kidney disease, unspecified: Secondary | ICD-10-CM | POA: Insufficient documentation

## 2016-06-14 DIAGNOSIS — Z95811 Presence of heart assist device: Secondary | ICD-10-CM

## 2016-06-14 DIAGNOSIS — Z7982 Long term (current) use of aspirin: Secondary | ICD-10-CM | POA: Diagnosis not present

## 2016-06-14 LAB — PROTIME-INR
INR: 2.91
PROTHROMBIN TIME: 31.1 s — AB (ref 11.4–15.2)

## 2016-06-14 LAB — BRAIN NATRIURETIC PEPTIDE: B NATRIURETIC PEPTIDE 5: 191.1 pg/mL — AB (ref 0.0–100.0)

## 2016-06-14 LAB — BASIC METABOLIC PANEL
Anion gap: 7 (ref 5–15)
BUN: 16 mg/dL (ref 6–20)
CHLORIDE: 104 mmol/L (ref 101–111)
CO2: 25 mmol/L (ref 22–32)
Calcium: 9.5 mg/dL (ref 8.9–10.3)
Creatinine, Ser: 1.41 mg/dL — ABNORMAL HIGH (ref 0.61–1.24)
GFR calc Af Amer: >60 mL/min (ref >60)
GFR calc non Af Amer: 55 mL/min — ABNORMAL LOW (ref >60)
Glucose, Bld: 106 mg/dL — ABNORMAL HIGH (ref 65–99)
POTASSIUM: 4.2 mmol/L (ref 3.5–5.1)
Sodium: 136 mmol/L (ref 135–145)

## 2016-06-14 LAB — CBC
HEMATOCRIT: 40 % (ref 39.0–52.0)
HEMOGLOBIN: 13.1 g/dL (ref 13.0–17.0)
MCH: 29.6 pg (ref 26.0–34.0)
MCHC: 32.8 g/dL (ref 30.0–36.0)
MCV: 90.5 fL (ref 78.0–100.0)
Platelets: 209 10*3/uL (ref 150–400)
RBC: 4.42 MIL/uL (ref 4.22–5.81)
RDW: 14.3 % (ref 11.5–15.5)
WBC: 3.1 10*3/uL — ABNORMAL LOW (ref 4.0–10.5)

## 2016-06-14 LAB — LACTATE DEHYDROGENASE: LDH: 314 U/L — AB (ref 98–192)

## 2016-06-14 MED ORDER — ZOLPIDEM TARTRATE 10 MG PO TABS: 10.0000 mg | ORAL_TABLET | Freq: Every evening | ORAL | 0 refills | Status: DC | PRN

## 2016-06-14 NOTE — Progress Notes (Signed)
Patient presents for 2 month follow up with full labs in VAD Clinic today. Reports no problems with VAD equipment or concerns with drive line.  Vital Signs:  Doppler Pressure: 88 Automatc BP: 147/76 (101 auto cuff MAP) HR: 74 SPO2: 98 % Weight: 196lb 2oz lbs (with batteries and controller) Last weight: 193lb 6oz Home weights: 192 lbs (with batteries) BMI today: 33.66  VAD interrogation & Equipment Management: Speed: 8600 Flow: 4.1 Power: 4.4 w  PI: 4.6 Alarms: None Events:PI events (5-10 per day), lowest PI 2.7 Fixed speed: 8600 Low speed limit: 8000 Primary Controller: Replace back up battery in 28 months.  LVAD equipment check completed and is in good working order. Back-up equipment present. Charged back up battery and performed self-test on equipment.   Exit Site Care: Drive line is being maintained weekly by patient family members. Drive line exit site well healed and incorporated. The velour is fully implanted at exit site. Dressing dry and intact. No erythema or drainage. Stabilization device present and accurately applied. Pt denies fever or chills. Pt states they have adequate dressing supplies at home.    Symptom Yes No Details  Angina  X Activity:  Claudication  X How far:  Syncope  X When:  Stroke  X NO NEW SYMPTOMS TO REPORT  Orthopnea  X How many pillows:  PND  X How often:  CPAP  X How many hrs:  Pedal edema  X   Abd fullness  X   N&V  X   Diaphoresis  X When:  Bleeding  X   Urine  X   SOB  X Activity:  Palpitations  X When:  ICD shock  X   Hospitlizaitons  X When/where/why:  ED visit  X When/where/why:  Other MD   When/who/why:  Activity     Fluid     Diet

## 2016-06-14 NOTE — Patient Instructions (Signed)
Take Melatonin 5 mg at night to help you sleep  Your physician recommends that you schedule a follow-up appointment in: 2 months

## 2016-06-17 NOTE — Progress Notes (Signed)
Patient ID: [name], male   DOB: [birth year], 55 y.o.   MRN: [id number]   ADVANCED HF CLINIC NOTE  Patient ID: [name], male   DOB: [birth year], 55 y.o.   MRN: [id number]  HPI: [name] is a 55 yo male with a history of severe CHF, NICM s/p LVAD and TVR (09/2011), VT, NSTEMI, LV thrombus, CKD, GERD, stroke and LVAD hemolysis with LVAD exchange (08/2013).   He was admitted to [facility] January 2015 with NSTEMI and RV failure. He had a difficult course that was complicated by embolic right CVA. He was intubated and eventually had a tracheostomy placed 07/28/13. He was transferred to inpatient rehab for extensive rehab and was making progression, however despite aggressive therapy with heparin, coumadin, ASA and Plavix, his LDH remained persistently elevated in the 950 range (was about 1750 on admit). He did not experience any pump dysfunction, however the case was discussed with Dr Allena Katz and [name] at [name] and it was felt to transfer him down there for possible pump exchange. He underwent a pump exchange 08/31/13. Post op was complicated by bleeding and he received multiple transfusions. He had AKI and had intermittent HD.  Admitted 5/13-5/18/15 for volume overload and possible hemolysis. LDH 647 and INR 1.69 on admission. Treated with IV lasix and IV heparin. While in the hospital developed some PI events and speed was decreased to 9400. ICD interrogated and showed A flutter and he was placed on IV amiodarone and then transitioned to 200 mg PO BID. He cardioverted to NSR on 5/15. LDH trended down and on discharge was 530 and INR 2.4.  In January 2016 admitted with multiple low flow alarms. Diuretics held and given IVF with improvement. Echo showed severe RV dysfunction  Admitted May 17,2016 from HF.LVAD clinic with volume overload. Diuresed with IV lasix.Multiple low flow alarms particularly in the am felt to be positional. Discharge weight was 175 pounds.    Ramp echo in 7/16, decreased speed to 8600 rpm.    Follow up for Heart Failure and LVAD:  Here with his family. Continues to do well. He has been much more compliant with medicines and INR follow-up. He has mild edema and insomnia but otherwise denies any complaints. Taking torsemide 20 every day. Not weighing himself routinely. No dyspnea,orthopnea/PND.  No bleeding. No lightheadedness. He is performing his own dressing every week.   Denies driveline trauma. Denies ICD shocks. Reports taking Coumadin as prescribed and adherence to anticoagulation based dietary restrictions.  Denies bright red blood per rectum or melena, no dark urine or hematuria.   VAD interrogation & Equipment Management: Speed: 8600 Flow: 4.1 Power: 4.4 w  PI: 4.6 Alarms: None Events:PI events (5-10 per day), lowest PI 2.7 Fixed speed: 8600 Low speed limit: 8000 Primary Controller: Replace back up battery in 28 months.    **2016 CT of Chest Reports--The position of the inflow cannula is very angulated, with tip directed toward the apical anterior wall. Positional events-->encouraged him to turn to another position when these alarms occur.                         Fixed speed:  8600 Low speed limit: 8000 **2016 CT of Chest Reports--The position of the inflow cannula is very angulated, with tip directed toward the apical anterior wall. Positional events-->encouraged him to turn to another position when these alarms occur.   Fixed speed: 8600 Low speed limit: 8000  Primary Controller: Replace back up battery in 19  months [id number] Back up controller: Replace back up battery in 32 months; PC [phone]  SH: Disabled, lives at home with caregiver. No ETOH or smoking  FH: Mother living; HTN        Father living; no health issues    Past Medical History:  Diagnosis Date  . AICD (automatic cardioverter/defibrillator) present   . Bipolar affective disorder (HCC) 10/22/2011   pt denies this hx 02/26/2012  . CHF (congestive heart failure) (HCC)     EF- 10-15  . Chronic renal insufficiency   . COMMON MIGRAINE 06/14/2009   Qualifier: Diagnosis of  By: Jonny RuizJohn MD, Len BlalockJames W   . Depression 08/11/2013   Pt denies  . Exertional dyspnea 02/26/2012  . GERD (gastroesophageal reflux disease)   . Hepatomegaly 09/16/2008   Qualifier: Diagnosis of  By: Wonda AmisBednar, NP-C, Michelle    . High cholesterol 02/26/2012   "at one time"  . History of blood transfusion 08/2011   "when I had heart pump"  . History of gout 02/26/2012  . HTN (hypertension)   . ICD - IN SITU 09/16/2008   Qualifier: Diagnosis of  By: Wonda AmisBednar, NP-C, Michelle    . LV (left ventricular) mural thrombus (HCC) 01/28/2011  . Medically noncompliant   . Migraines   . Mitral regurgitation   . MITRAL STENOSIS/ INSUFFICIENCY, NON-RHEUMATIC 09/22/2008   Qualifier: Diagnosis of  By: [name], MD, Trixie DredgeFACC, Xaidyn Kepner R   . Sleep apnea   . Substance abuse   . Syncope   . SYSTOLIC HEART FAILURE, CHRONIC 09/22/2008   Qualifier: Diagnosis of  By: [name], MD, Trixie DredgeFACC, Sosha Shepherd R   . Thrombus 08/06/2010  . Tobacco user     Current Outpatient Prescriptions  Medication Sig Dispense Refill  . aspirin 81 MG chewable tablet Chew 1 tablet (81 mg total) by mouth daily. 30 tablet 12  . atorvastatin (LIPITOR) 40 MG tablet Take 1 tablet (40 mg total) by mouth daily. 90 tablet 3  . carvedilol (COREG) 3.125 MG tablet TAKE 1 TABLET BY MOUTH TWICE A DAY WITH A MEAL 180 tablet 3  . clopidogrel (PLAVIX) 75 MG tablet Take 1 tablet (75 mg total) by mouth daily with breakfast. 30 tablet 6  . clopidogrel (PLAVIX) 75 MG tablet TAKE 1 TABLET (75 MG TOTAL) BY MOUTH DAILY WITH BREAKFAST. 30 tablet 4  . hydrALAZINE (APRESOLINE) 50 MG tablet Take 1 tablet (50 mg total) by mouth 3 (three) times daily. 270 tablet 3  . losartan (COZAAR) 100 MG tablet Take 1 tablet (100 mg total) by mouth daily. 90 tablet 3  . metolazone (ZAROXOLYN) 2.5 MG tablet Take 1 tablet (2.5 mg total) by mouth as needed (When MD tells you to take it). (Patient not taking:  Reported on 04/16/2016) 10 tablet 3  . potassium chloride SA (K-DUR,KLOR-CON) 20 MEQ tablet Take 20 mEq by mouth daily.    . tamsulosin (FLOMAX) 0.4 MG CAPS capsule Take 1 capsule (0.4 mg total) by mouth daily. 90 capsule 3  . torsemide (DEMADEX) 20 MG tablet Take 20 mg by mouth daily.    Marland Kitchen. warfarin (COUMADIN) 4 MG tablet TAKE ONE TABLETONCE DAILY OR AS INSTRUCTED FOR INR 2.0 - 3.0 140 tablet 1  . zolpidem (AMBIEN) 10 MG tablet Take 1 tablet (10 mg total) by mouth at bedtime as needed for sleep. 20 tablet 0   No current facility-administered medications for this encounter.    Facility-Administered Medications Ordered in Other Encounters  Medication Dose Route Frequency Provider Last Rate Last Dose  .  sodium chloride 0.9 % injection 10-40 mL  10-40 mL Intracatheter PRN [name], MD        Ace inhibitors and Lexapro [escitalopram oxalate]  REVIEW OF SYSTEMS: All systems negative except as listed in HPI, PMH and Problem list.   Vital Signs:  Doppler Pressure: 88 Automatc BP: 147/76 (101 auto cuff MAP) HR: 74 SPO2: 98 % Weight: 196lb 2oz lbs (with batteries and controller) Last weight: 193lb 6oz Home weights: 192 lbs (with batteries) BMI today: 33.66  Physical Exam: GENERAL: Well appearing, male who presents to clinic today HEENT: normal  NECK: Supple, JVP 7-8 prominent superficial veins on R (suspect occluded IJ)  No lymphadenopathy or thyromegaly appreciated.   CARDIAC:  Mechanical heart sounds with LVAD hum present, prominent heart sounds LUNGS:  Clear to auscultation bilaterally.  ABDOMEN:  Soft, round, mildly distended, positive bowel sounds x4.     LVAD exit site: Velour exposed min tan exudate. Mild erythema. Non tender. Stabilization device present and accurately applied. Dressing being changed weekly. Driveline twisted.  EXTREMITIES:  Warm and dry, no cyanosis, clubbing, rash, R and LLE trace edema NEUROLOGIC:  Alert and oriented x 4.  L-sided weakness. Gait steady.   No aphasia.  Affect pleasant.      ASSESSMENT AND PLAN:   1) Chronic systolic HF: NICM, s/p LVAD implant (08/2011) and subsequent pump exchange (08/2013).  Stable NYHA II - Volume status mildly elevated. Continue current regimen and instructed to take extra 20 mg torsemide as needed for fluid overload Reinforced daily weights.  2) LVAD, s/p (08/2011) and then pump exchange (08/2013) due to pump thrombosis.  VAD interrogated personally. No alarms, overall stable.  - Not a transplant candidate due to previous compliance issues and debility from CVA.  3) HTN: MAPs look good. Continue current regimen.  4) CVA: Stable.C ontinues to have mild left sided weakness but functions well . No longer in therapy. Continue atorvastatin 40 mg daily.  6) h/o Atrial Flutter: now in NSR 7) Anticoagulation management: INR Goal 2.5-3.0. Will check INR today and adjust accordingly. He is on ASA 325 daily as well along with Plavix given history of CVA.  8) RV failure: stable 9) Driveline trauma/twisting: Dressing was changed today. Site looked good. Reminded him to be careful with driveline 10) Insomnia: -Instructed to use melatonin to help with sleep. Gave prescription for Ambien to use only PRN.   LDH 314. INR 2.9. BNP only 191  Arvilla Meres, MD 06/17/2016   Patient seen and examined with Tonye Becket, NP. We discussed all aspects of the encounter. I agree with the assessment and plan as stated above.   Overall doing well. He has some mild volume overload in setting of dietary indiscretion. Will have him take extra torsemide today and tomorrow as needed. Reinforced need for daily weights and reviewed use of sliding scale diuretics. He is being much more compliant with meds. Driveline with mild drainage but this is unchanged. No infections symptoms. We cultured this area in clinic. Will await culture data. VAD parameters stable.   Draycen Leichter,MD 8:35 PM

## 2016-06-25 ENCOUNTER — Other Ambulatory Visit (HOSPITAL_COMMUNITY): Payer: Self-pay | Admitting: Internal Medicine

## 2016-06-25 DIAGNOSIS — I5022 Chronic systolic (congestive) heart failure: Secondary | ICD-10-CM

## 2016-07-03 ENCOUNTER — Ambulatory Visit (HOSPITAL_COMMUNITY): Payer: Self-pay | Admitting: Pharmacist

## 2016-07-03 ENCOUNTER — Inpatient Hospital Stay (HOSPITAL_COMMUNITY): Admission: RE | Admit: 2016-07-03 | Payer: Medicare Other | Source: Ambulatory Visit

## 2016-07-03 ENCOUNTER — Ambulatory Visit (HOSPITAL_COMMUNITY)
Admission: RE | Admit: 2016-07-03 | Discharge: 2016-07-03 | Disposition: A | Discharge status: Home / Self Care | Payer: Medicare Other | Source: Ambulatory Visit | Attending: Cardiology | Admitting: Cardiology

## 2016-07-03 DIAGNOSIS — Z95811 Presence of heart assist device: Secondary | ICD-10-CM

## 2016-07-03 LAB — PROTIME-INR
INR: 2.54
PROTHROMBIN TIME: 27.8 s — AB (ref 11.4–15.2)

## 2016-07-15 ENCOUNTER — Other Ambulatory Visit (HOSPITAL_COMMUNITY): Payer: Self-pay | Admitting: Cardiology

## 2016-07-15 DIAGNOSIS — I10 Essential (primary) hypertension: Secondary | ICD-10-CM

## 2016-07-15 DIAGNOSIS — I471 Supraventricular tachycardia: Secondary | ICD-10-CM

## 2016-07-15 DIAGNOSIS — I5022 Chronic systolic (congestive) heart failure: Secondary | ICD-10-CM

## 2016-07-15 DIAGNOSIS — I5023 Acute on chronic systolic (congestive) heart failure: Secondary | ICD-10-CM

## 2016-07-15 DIAGNOSIS — I6312 Cerebral infarction due to embolism of basilar artery: Secondary | ICD-10-CM

## 2016-07-15 DIAGNOSIS — Z95811 Presence of heart assist device: Secondary | ICD-10-CM

## 2016-07-24 ENCOUNTER — Ambulatory Visit (HOSPITAL_COMMUNITY): Payer: Self-pay | Admitting: Pharmacist

## 2016-07-24 ENCOUNTER — Ambulatory Visit (HOSPITAL_COMMUNITY)
Admission: RE | Admit: 2016-07-24 | Discharge: 2016-07-24 | Disposition: A | Discharge status: Home / Self Care | Payer: Medicare Other | Source: Ambulatory Visit | Attending: Internal Medicine | Admitting: Internal Medicine

## 2016-07-24 DIAGNOSIS — Z95811 Presence of heart assist device: Secondary | ICD-10-CM | POA: Diagnosis not present

## 2016-07-24 LAB — PROTIME-INR
INR: 3.13
Prothrombin Time: 32.9 seconds — ABNORMAL HIGH (ref 11.4–15.2)

## 2016-08-11 ENCOUNTER — Other Ambulatory Visit (HOSPITAL_COMMUNITY): Payer: Self-pay | Admitting: Adult Health

## 2016-08-15 ENCOUNTER — Ambulatory Visit (HOSPITAL_COMMUNITY): Payer: Self-pay | Admitting: Pharmacist

## 2016-08-15 ENCOUNTER — Ambulatory Visit (HOSPITAL_COMMUNITY)
Admission: RE | Admit: 2016-08-15 | Discharge: 2016-08-15 | Disposition: A | Discharge status: Home / Self Care | Payer: Medicare Other | Source: Ambulatory Visit | Attending: Internal Medicine | Admitting: Internal Medicine

## 2016-08-15 DIAGNOSIS — Z7901 Long term (current) use of anticoagulants: Secondary | ICD-10-CM | POA: Insufficient documentation

## 2016-08-15 DIAGNOSIS — Z8249 Family history of ischemic heart disease and other diseases of the circulatory system: Secondary | ICD-10-CM | POA: Diagnosis not present

## 2016-08-15 DIAGNOSIS — Z95811 Presence of heart assist device: Secondary | ICD-10-CM

## 2016-08-15 DIAGNOSIS — Z7902 Long term (current) use of antithrombotics/antiplatelets: Secondary | ICD-10-CM | POA: Diagnosis not present

## 2016-08-15 DIAGNOSIS — I5022 Chronic systolic (congestive) heart failure: Secondary | ICD-10-CM | POA: Insufficient documentation

## 2016-08-15 DIAGNOSIS — I10 Essential (primary) hypertension: Secondary | ICD-10-CM | POA: Diagnosis not present

## 2016-08-15 DIAGNOSIS — Z95810 Presence of automatic (implantable) cardiac defibrillator: Secondary | ICD-10-CM | POA: Diagnosis not present

## 2016-08-15 DIAGNOSIS — I480 Paroxysmal atrial fibrillation: Secondary | ICD-10-CM | POA: Diagnosis not present

## 2016-08-15 DIAGNOSIS — I69354 Hemiplegia and hemiparesis following cerebral infarction affecting left non-dominant side: Secondary | ICD-10-CM | POA: Insufficient documentation

## 2016-08-15 DIAGNOSIS — F319 Bipolar disorder, unspecified: Secondary | ICD-10-CM | POA: Insufficient documentation

## 2016-08-15 DIAGNOSIS — I513 Intracardiac thrombosis, not elsewhere classified: Secondary | ICD-10-CM | POA: Diagnosis not present

## 2016-08-15 DIAGNOSIS — I214 Non-ST elevation (NSTEMI) myocardial infarction: Secondary | ICD-10-CM | POA: Insufficient documentation

## 2016-08-15 DIAGNOSIS — Z930 Tracheostomy status: Secondary | ICD-10-CM | POA: Diagnosis not present

## 2016-08-15 DIAGNOSIS — G4730 Sleep apnea, unspecified: Secondary | ICD-10-CM | POA: Diagnosis not present

## 2016-08-15 DIAGNOSIS — Z9119 Patient's noncompliance with other medical treatment and regimen: Secondary | ICD-10-CM | POA: Insufficient documentation

## 2016-08-15 DIAGNOSIS — Z7982 Long term (current) use of aspirin: Secondary | ICD-10-CM | POA: Insufficient documentation

## 2016-08-15 DIAGNOSIS — E7800 Pure hypercholesterolemia, unspecified: Secondary | ICD-10-CM | POA: Diagnosis not present

## 2016-08-15 DIAGNOSIS — N189 Chronic kidney disease, unspecified: Secondary | ICD-10-CM | POA: Insufficient documentation

## 2016-08-15 DIAGNOSIS — M109 Gout, unspecified: Secondary | ICD-10-CM | POA: Insufficient documentation

## 2016-08-15 DIAGNOSIS — Z8673 Personal history of transient ischemic attack (TIA), and cerebral infarction without residual deficits: Secondary | ICD-10-CM | POA: Diagnosis not present

## 2016-08-15 DIAGNOSIS — Z9889 Other specified postprocedural states: Secondary | ICD-10-CM | POA: Insufficient documentation

## 2016-08-15 DIAGNOSIS — I429 Cardiomyopathy, unspecified: Secondary | ICD-10-CM | POA: Insufficient documentation

## 2016-08-15 DIAGNOSIS — I340 Nonrheumatic mitral (valve) insufficiency: Secondary | ICD-10-CM | POA: Diagnosis not present

## 2016-08-15 DIAGNOSIS — K219 Gastro-esophageal reflux disease without esophagitis: Secondary | ICD-10-CM | POA: Diagnosis not present

## 2016-08-15 DIAGNOSIS — N179 Acute kidney failure, unspecified: Secondary | ICD-10-CM | POA: Diagnosis not present

## 2016-08-15 DIAGNOSIS — I130 Hypertensive heart and chronic kidney disease with heart failure and stage 1 through stage 4 chronic kidney disease, or unspecified chronic kidney disease: Secondary | ICD-10-CM | POA: Insufficient documentation

## 2016-08-15 LAB — CBC
HCT: 40.3 % (ref 39.0–52.0)
Hemoglobin: 13.2 g/dL (ref 13.0–17.0)
MCH: 29.4 pg (ref 26.0–34.0)
MCHC: 32.8 g/dL (ref 30.0–36.0)
MCV: 89.8 fL (ref 78.0–100.0)
PLATELETS: 189 10*3/uL (ref 150–400)
RBC: 4.49 MIL/uL (ref 4.22–5.81)
RDW: 14.8 % (ref 11.5–15.5)
WBC: 3.7 10*3/uL — ABNORMAL LOW (ref 4.0–10.5)

## 2016-08-15 LAB — BASIC METABOLIC PANEL
Anion gap: 7 (ref 5–15)
BUN: 18 mg/dL (ref 6–20)
CO2: 28 mmol/L (ref 22–32)
Calcium: 9.6 mg/dL (ref 8.9–10.3)
Chloride: 103 mmol/L (ref 101–111)
Creatinine, Ser: 1.3 mg/dL — ABNORMAL HIGH (ref 0.61–1.24)
GFR calc Af Amer: >60 mL/min (ref >60)
GFR calc non Af Amer: >60 mL/min (ref >60)
Glucose, Bld: 124 mg/dL — ABNORMAL HIGH (ref 65–99)
Potassium: 4 mmol/L (ref 3.5–5.1)
Sodium: 138 mmol/L (ref 135–145)

## 2016-08-15 LAB — LACTATE DEHYDROGENASE: LDH: 328 U/L — ABNORMAL HIGH (ref 98–192)

## 2016-08-15 LAB — PROTIME-INR
INR: 2.79
Prothrombin Time: 30 seconds — ABNORMAL HIGH (ref 11.4–15.2)

## 2016-08-15 NOTE — Progress Notes (Signed)
Patient presents for 2 month  follow up in VAD Clinic today. Reports no problems with VAD equipment. Concerned with tangling of drive-line. Assisted with unraveling external drive-line and anchor placed. Patient and family educated about importance of maintaining the drive-lines integrity.  Vital Signs:  Doppler Pressure 84   Automatc BP: 82/64 (72) HR:85   SPO2:98  %  Weight: 190.4 lb w/o eqt Last weight: 193.6 lb  VAD Indication: Destination Therapy    VAD interrogation & Equipment Management: Speed:8600 Flow: 4.1 Power:4.4 w    PI:4.6  Alarms: no clinical alarms Events: 10-15 PI events daily  Fixed speed 8600 Low speed limit: 8000  Primary Controller:  Replace back up battery in 76months. Back up controller:   Replace back up battery in 26 months.    I reviewed the LVAD parameters from today and compared the results to the patient's prior recorded data. LVAD interrogation was NEGATIVE for significant power changes, NEGATIVE for clinical alarms and STABLE for PI events/speed drops. No programming changes were made and pump is functioning within specified parameters. Pt is performing daily controller and system monitor self tests along with completing weekly and monthly maintenance for LVAD equipment.  LVAD equipment check completed and is in good working order. Back-up equipment present. Charged back up battery and performed self-test on equipment.   Exit Site Care: Drive line is being maintained weekly  by family members. Drive line exit site well healed and incorporated. The velour is fully implanted at exit site. Dressing dry and intact. No erythema or drainage. Stabilization device present and accurately applied. Pt denies fever or chills. Pt states they have adequate dressing supplies at home. 8 weekly dressing change kits given.  Significant Events on VAD Support:  08/2013>> pump exchange at [facility] 06/2013>> CVA   BP & Labs:  VFM73 - Doppler is reflecting  MAP  Hgb 13.2 - No S/S of bleeding. Specifically denies melena/BRBPR or nosebleeds.  LDH stable at 328 with established baseline of 290- 350. Denies tea-colored urine. No power elevations noted on interrogation.   Return to clinic in 2 months  Marcellus Scott RN VAD Coordinator   Office: [phone] 24/7 Emergency VAD Pager: [phone]

## 2016-08-15 NOTE — Patient Instructions (Signed)
Follow up in two months!  Please monitor you driveline and keep it untangled.

## 2016-08-16 NOTE — Progress Notes (Signed)
Patient ID: [name], male   DOB: 1961-01-03, 56 y.o.   MRN: [id number]   ADVANCED HF CLINIC NOTE  Patient ID: [name], male   DOB: 12/28/1960, 56 y.o.   MRN: [id number]  HPI: [name] is a 56 yo male with a history of severe CHF, NICM s/p LVAD and TVR (09/2011), VT, NSTEMI, LV thrombus, CKD, GERD, stroke and LVAD hemolysis with LVAD exchange (08/2013).   He was admitted to [facility] January 2015 with NSTEMI and RV failure. He had a difficult course that was complicated by embolic right CVA. He was intubated and eventually had a tracheostomy placed 07/28/13. He was transferred to inpatient rehab for extensive rehab and was making progression, however despite aggressive therapy with heparin, coumadin, ASA and Plavix, his LDH remained persistently elevated in the 950 range (was about 1750 on admit). He did not experience any pump dysfunction, however the case was discussed with Dr Allena Katz and [name] at [facility] and it was felt to transfer him down there for possible pump exchange. He underwent a pump exchange 08/31/13. Post op was complicated by bleeding and he received multiple transfusions. He had AKI and had intermittent HD.  Admitted 5/13-5/18/15 for volume overload and possible hemolysis. LDH 647 and INR 1.69 on admission. Treated with IV lasix and IV heparin. While in the hospital developed some PI events and speed was decreased to 9400. ICD interrogated and showed A flutter and he was placed on IV amiodarone and then transitioned to 200 mg PO BID. He cardioverted to NSR on 5/15. LDH trended down and on discharge was 530 and INR 2.4.  In January 2016 admitted with multiple low flow alarms. Diuretics held and given IVF with improvement. Echo showed severe RV dysfunction  Admitted May 17,2016 from HF.LVAD clinic with volume overload. Diuresed with IV lasix.Multiple low flow alarms particularly in the am felt to be positional. Discharge weight was 175 pounds.    Ramp echo in 7/16, decreased speed to 8600 rpm.    Follow up for Heart Failure and LVAD:  Here with his family. Continues to do well. He has been much more compliant with medicines and INR follow-up. Denies any HF symptoms. No edema, orthopnea or PND. Not very active but says he can do all activities without much difficulty. Taking torsemide 20 every day. Not weighing himself routinely but weight relatively stable. No bleeding. No lightheadedness. He is performing his own dressing every week. Driveline cable very twisted.   Denies ICD shocks. Reports taking Coumadin as prescribed and adherence to anticoagulation based dietary restrictions.  Denies bright red blood per rectum or melena, no dark urine or hematuria.  VAD Indication: Destination Therapy    VAD interrogation & Equipment Management: Speed:8600 Flow: 4.1 Power:4.4 w PI:4.6  Alarms: no clinical alarms Events: 10-15 PI events daily  Fixed speed 8600 Low speed limit: 8000  Primary Controller: Replace back up battery in 17months. Back up controller: Replace back up battery in 65months.   SH: Disabled, lives at home with caregiver. No ETOH or smoking  FH: Mother living; HTN        Father living; no health issues    Past Medical History:  Diagnosis Date  . AICD (automatic cardioverter/defibrillator) present   . Bipolar affective disorder (HCC) 10/22/2011   pt denies this hx 02/26/2012  . CHF (congestive heart failure) (HCC)    EF- 10-15  . Chronic renal insufficiency   . COMMON MIGRAINE 06/14/2009   Qualifier: Diagnosis of  By: Jonny Ruiz MD, Len Blalock   .  Depression 08/11/2013   Pt denies  . Exertional dyspnea 02/26/2012  . GERD (gastroesophageal reflux disease)   . Hepatomegaly 09/16/2008   Qualifier: Diagnosis of  By: Wonda Amis    . High cholesterol 02/26/2012   "at one time"  . History of blood transfusion 08/2011   "when I had heart pump"  . History of gout 02/26/2012  . HTN (hypertension)   . ICD - IN SITU 09/16/2008   Qualifier: Diagnosis of  By:  Wonda Amis    . LV (left ventricular) mural thrombus (HCC) 01/28/2011  . Medically noncompliant   . Migraines   . Mitral regurgitation   . MITRAL STENOSIS/ INSUFFICIENCY, NON-RHEUMATIC 09/22/2008   Qualifier: Diagnosis of  By: Gala Romney, MD, Trixie Dredge Sleep apnea   . Substance abuse   . Syncope   . SYSTOLIC HEART FAILURE, CHRONIC 09/22/2008   Qualifier: Diagnosis of  By: Gala Romney, MD, Trixie Dredge Thrombus 08/06/2010  . Tobacco user     Current Outpatient Prescriptions  Medication Sig Dispense Refill  . aspirin 81 MG chewable tablet Chew 1 tablet (81 mg total) by mouth daily. 30 tablet 12  . atorvastatin (LIPITOR) 40 MG tablet TAKE 1 TABLET BY MOUTH EVERY DAY 90 tablet 3  . carvedilol (COREG) 3.125 MG tablet TAKE 1 TABLET BY MOUTH TWICE A DAY WITH A MEAL 180 tablet 3  . clopidogrel (PLAVIX) 75 MG tablet Take 1 tablet (75 mg total) by mouth daily with breakfast. 30 tablet 6  . hydrALAZINE (APRESOLINE) 50 MG tablet Take 1 tablet (50 mg total) by mouth 3 (three) times daily. 270 tablet 3  . KLOR-CON M20 20 MEQ tablet TAKE 2 TABLETS BY MOUTH TWICE A DAY 60 tablet 4  . losartan (COZAAR) 100 MG tablet TAKE 1 TABLET (100 MG TOTAL) BY MOUTH DAILY. 90 tablet 3  . tamsulosin (FLOMAX) 0.4 MG CAPS capsule TAKE 1 CAPSULE (0.4 MG TOTAL) BY MOUTH DAILY. 90 capsule 3  . torsemide (DEMADEX) 20 MG tablet Take 20 mg by mouth daily.    Marland Kitchen warfarin (COUMADIN) 4 MG tablet TAKE ONE TABLETONCE DAILY OR AS INSTRUCTED FOR INR 2.0 - 3.0 140 tablet 1  . zolpidem (AMBIEN) 10 MG tablet Take 1 tablet (10 mg total) by mouth at bedtime as needed for sleep. 20 tablet 0  . clopidogrel (PLAVIX) 75 MG tablet TAKE 1 TABLET (75 MG TOTAL) BY MOUTH DAILY WITH BREAKFAST. 30 tablet 4  . metolazone (ZAROXOLYN) 2.5 MG tablet Take 1 tablet (2.5 mg total) by mouth as needed (When MD tells you to take it). (Patient not taking: Reported on 08/15/2016) 10 tablet 3  . potassium chloride SA (K-DUR,KLOR-CON) 20 MEQ  tablet Take 20 mEq by mouth daily.    Marland Kitchen torsemide (DEMADEX) 20 MG tablet TAKE 1 TABLET (20 MG TOTAL) BY MOUTH ONCE. 180 tablet 2   No current facility-administered medications for this encounter.    Facility-Administered Medications Ordered in Other Encounters  Medication Dose Route Frequency Provider Last Rate Last Dose  . sodium chloride 0.9 % injection 10-40 mL  10-40 mL Intracatheter PRN [name], MD        Ace inhibitors and Lexapro [escitalopram oxalate]  REVIEW OF SYSTEMS: All systems negative except as listed in HPI, PMH and Problem list.   Vital Signs:  Doppler Pressure 84                Automatc BP: 82/64 (72) HR:85  SPO2:98  %  Weight: 190.4 lb w/o eqt Last weight: 193.6 lb  Physical Exam: GENERAL: Well appearing, male who presents to clinic today HEENT: normal  NECK: Supple, JVP 7 prominent superficial veins on R (suspect occluded IJ)  No lymphadenopathy or thyromegaly appreciated.   CARDIAC:  Mechanical heart sounds with LVAD hum present, prominent heart sounds LUNGS:  Clear to auscultation bilaterally.  ABDOMEN:  Soft, round, obese positive bowel sounds x4.     LVAD exit site: Clean. Non tender. Stabilization device present and accurately applied. Dressing being changed weekly. Driveline extensively twisted.  EXTREMITIES:  Warm and dry, no cyanosis, clubbing, rash, R and LLE trace edema NEUROLOGIC:  Alert and oriented x 4.  L-sided weakness. Gait steady.  No aphasia.  Affect pleasant.      ASSESSMENT AND PLAN:   1) Chronic systolic HF: NICM, s/p LVAD implant (08/2011) and subsequent pump exchange (08/2013).  Stable NYHA II - Volume status looks good. Continue current regimen and can take extra 20 mg torsemide as needed for fluid overload Reinforced daily weights.  2) LVAD, s/p (08/2011) and then pump exchange (08/2013) due to pump thrombosis.  VAD interrogated personally. No alarms, overall stable. Extensive driveline twisting (see below) - Not a  transplant candidate due to previous compliance issues and debility from CVA.  3) HTN: MAPs look good. Continue current regimen.  4) CVA: Stable residual mild left sided weakness but functions well . No longer in therapy. Continue atorvastatin 40 mg daily.  5) h/o Atrial Flutter: now in NSR 6) Anticoagulation management: INR Goal 2.5-3.0. INR 2.7 today. He is on ASA 325 daily as well along with Plavix given history of CVA.  7) RV failure: stable 8) Driveline trauma/twisting: Driveline with extensive twisting again today - now with exposed wires. We unraveled it and covered the area. Reminded him of the risk of pump failure if he continues to damage the driveline.    Letetia Romanello,MD 3:12 PM

## 2016-09-05 ENCOUNTER — Other Ambulatory Visit (HOSPITAL_COMMUNITY): Payer: Self-pay | Admitting: Cardiology

## 2016-09-05 ENCOUNTER — Ambulatory Visit (HOSPITAL_COMMUNITY): Payer: Self-pay | Admitting: Pharmacist

## 2016-09-05 ENCOUNTER — Ambulatory Visit (HOSPITAL_COMMUNITY)
Admission: RE | Admit: 2016-09-05 | Discharge: 2016-09-05 | Disposition: A | Discharge status: Home / Self Care | Payer: Medicare Other | Source: Ambulatory Visit | Attending: Cardiology | Admitting: Cardiology

## 2016-09-05 ENCOUNTER — Other Ambulatory Visit (HOSPITAL_COMMUNITY): Payer: Self-pay

## 2016-09-05 DIAGNOSIS — I5022 Chronic systolic (congestive) heart failure: Secondary | ICD-10-CM

## 2016-09-05 DIAGNOSIS — I10 Essential (primary) hypertension: Secondary | ICD-10-CM

## 2016-09-05 DIAGNOSIS — I5023 Acute on chronic systolic (congestive) heart failure: Secondary | ICD-10-CM

## 2016-09-05 DIAGNOSIS — Z95811 Presence of heart assist device: Secondary | ICD-10-CM | POA: Diagnosis not present

## 2016-09-05 DIAGNOSIS — I6312 Cerebral infarction due to embolism of basilar artery: Secondary | ICD-10-CM

## 2016-09-05 DIAGNOSIS — I471 Supraventricular tachycardia: Secondary | ICD-10-CM

## 2016-09-05 LAB — PROTIME-INR
INR: 2.74
Prothrombin Time: 29.5 seconds — ABNORMAL HIGH (ref 11.4–15.2)

## 2016-09-05 MED ORDER — ZOLPIDEM TARTRATE 10 MG PO TABS
10.0000 mg | ORAL_TABLET | Freq: Every evening | ORAL | 0 refills | Status: DC | PRN
Start: 2016-09-05 — End: 2016-09-25

## 2016-09-17 ENCOUNTER — Telehealth: Payer: Self-pay | Admitting: Internal Medicine

## 2016-09-17 NOTE — Telephone Encounter (Signed)
called pt to schedule past due appt, pt has no voicemail and mobile number does not work.

## 2016-09-18 ENCOUNTER — Encounter: Payer: Self-pay | Admitting: Internal Medicine

## 2016-09-25 ENCOUNTER — Ambulatory Visit (HOSPITAL_COMMUNITY)
Admission: RE | Admit: 2016-09-25 | Discharge: 2016-09-25 | Disposition: A | Discharge status: Home / Self Care | Payer: Medicare Other | Source: Ambulatory Visit | Attending: Internal Medicine | Admitting: Internal Medicine

## 2016-09-25 ENCOUNTER — Other Ambulatory Visit (HOSPITAL_COMMUNITY): Payer: Self-pay | Admitting: Cardiology

## 2016-09-25 ENCOUNTER — Ambulatory Visit (HOSPITAL_COMMUNITY): Payer: Self-pay | Admitting: Pharmacist

## 2016-09-25 DIAGNOSIS — Z95811 Presence of heart assist device: Secondary | ICD-10-CM | POA: Diagnosis not present

## 2016-09-25 LAB — PROTIME-INR
INR: 2.71
Prothrombin Time: 29.3 seconds — ABNORMAL HIGH (ref 11.4–15.2)

## 2016-09-25 MED ORDER — ZOLPIDEM TARTRATE 10 MG PO TABS: 10.0000 mg | ORAL_TABLET | Freq: Every evening | ORAL | 2 refills | Status: DC | PRN

## 2016-10-04 ENCOUNTER — Encounter: Payer: Self-pay | Admitting: Podiatry

## 2016-10-04 ENCOUNTER — Ambulatory Visit (INDEPENDENT_AMBULATORY_CARE_PROVIDER_SITE_OTHER): Payer: Medicare Other | Admitting: Podiatry

## 2016-10-04 ENCOUNTER — Ambulatory Visit: Payer: Medicare Other | Admitting: Podiatry

## 2016-10-04 DIAGNOSIS — B351 Tinea unguium: Secondary | ICD-10-CM

## 2016-10-04 DIAGNOSIS — M79676 Pain in unspecified toe(s): Secondary | ICD-10-CM

## 2016-10-04 NOTE — Progress Notes (Signed)
   Subjective:    Patient ID: [name], male    DOB: 1960/12/10, 56 y.o.   MRN: [id number]  HPI This patient presents to the office with long thick nails. The patient presents here today for B/L toenail debridement.. He says his nails are long thick and painful walking and wearing his shoes. He presents for preventive foot care services.  Review of Systems  All other systems reviewed and are negative.      Objective:   Physical Exam GENERAL APPEARANCE: Alert, conversant. Appropriately groomed. No acute distress.  VASCULAR: Pedal pulses palpable at  [facility] and PT bilateral.  Capillary refill time is immediate to all digits,  Normal temperature gradient.    NEUROLOGIC: sensation is normal to 5.07 monofilament at 5/5 sites bilateral.  Light touch is intact bilateral, Muscle strength normal.  MUSCULOSKELETAL: acceptable muscle strength, tone and stability bilateral.  Intrinsic muscluature intact bilateral.  Rectus appearance of foot and digits noted bilateral. Limited ROM left foot in rearfoot/ankle complex.  DERMATOLOGIC: skin color, texture, and turgor are within normal limits.  No preulcerative lesions or ulcers  are seen, no interdigital maceration noted.  No open lesions present.  . No drainage noted.  NAILS  Thick disfigured discolored nails both feet.         Assessment & Plan:  Onychomycosis B/L  IE  Debridement and grinding of long painful nails.   Helane Gunther DPM

## 2016-10-06 ENCOUNTER — Encounter (HOSPITAL_COMMUNITY): Payer: Self-pay

## 2016-10-06 ENCOUNTER — Emergency Department (HOSPITAL_COMMUNITY)
Admission: EM | Admit: 2016-10-06 | Discharge: 2016-10-08 | Disposition: A | Discharge status: Home / Self Care | Payer: Medicare Other | Attending: Emergency Medicine | Admitting: Emergency Medicine

## 2016-10-06 DIAGNOSIS — N189 Chronic kidney disease, unspecified: Secondary | ICD-10-CM | POA: Insufficient documentation

## 2016-10-06 DIAGNOSIS — Z7982 Long term (current) use of aspirin: Secondary | ICD-10-CM | POA: Diagnosis not present

## 2016-10-06 DIAGNOSIS — Z7901 Long term (current) use of anticoagulants: Secondary | ICD-10-CM | POA: Insufficient documentation

## 2016-10-06 DIAGNOSIS — T8209XA Other mechanical complication of heart valve prosthesis, initial encounter: Secondary | ICD-10-CM | POA: Insufficient documentation

## 2016-10-06 DIAGNOSIS — I130 Hypertensive heart and chronic kidney disease with heart failure and stage 1 through stage 4 chronic kidney disease, or unspecified chronic kidney disease: Secondary | ICD-10-CM | POA: Insufficient documentation

## 2016-10-06 DIAGNOSIS — Y828 Other medical devices associated with adverse incidents: Secondary | ICD-10-CM | POA: Diagnosis not present

## 2016-10-06 DIAGNOSIS — I252 Old myocardial infarction: Secondary | ICD-10-CM | POA: Diagnosis not present

## 2016-10-06 DIAGNOSIS — T829XXA Unspecified complication of cardiac and vascular prosthetic device, implant and graft, initial encounter: Secondary | ICD-10-CM

## 2016-10-06 DIAGNOSIS — Z8673 Personal history of transient ischemic attack (TIA), and cerebral infarction without residual deficits: Secondary | ICD-10-CM | POA: Insufficient documentation

## 2016-10-06 DIAGNOSIS — Z79899 Other long term (current) drug therapy: Secondary | ICD-10-CM | POA: Insufficient documentation

## 2016-10-06 DIAGNOSIS — Z95810 Presence of automatic (implantable) cardiac defibrillator: Secondary | ICD-10-CM | POA: Insufficient documentation

## 2016-10-06 DIAGNOSIS — I5022 Chronic systolic (congestive) heart failure: Secondary | ICD-10-CM | POA: Insufficient documentation

## 2016-10-06 DIAGNOSIS — Z87891 Personal history of nicotine dependence: Secondary | ICD-10-CM | POA: Diagnosis not present

## 2016-10-06 DIAGNOSIS — T82598A Other mechanical complication of other cardiac and vascular devices and implants, initial encounter: Secondary | ICD-10-CM | POA: Diagnosis not present

## 2016-10-06 NOTE — ED Triage Notes (Signed)
Pt coming from home with ems. Pt has an LVAD in place and his power went out. Pt is here to for power for his LVAD. NAD at this time.

## 2016-10-06 NOTE — ED Notes (Signed)
[facility] brought LVAD cart in room. Pt still has power to his battery and will be switched out by [facility] when needed

## 2016-10-06 NOTE — ED Notes (Signed)
Pt provided with [place] sandwich and a coke. Nad
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
## 2016-10-07 DIAGNOSIS — T8209XA Other mechanical complication of heart valve prosthesis, initial encounter: Secondary | ICD-10-CM | POA: Diagnosis not present

## 2016-10-07 MED ORDER — LOSARTAN POTASSIUM 50 MG PO TABS
100.0000 mg | ORAL_TABLET | Freq: Every day | ORAL | Status: DC
  Administered 2016-10-07: 100 mg via ORAL
  Filled 2016-10-07: qty 2

## 2016-10-07 MED ORDER — TORSEMIDE 20 MG PO TABS
20.0000 mg | ORAL_TABLET | Freq: Every day | ORAL | Status: DC
  Administered 2016-10-07: 20 mg via ORAL
  Filled 2016-10-07: qty 1

## 2016-10-07 MED ORDER — POTASSIUM CHLORIDE CRYS ER 20 MEQ PO TBCR
20.0000 meq | EXTENDED_RELEASE_TABLET | Freq: Two times a day (BID) | ORAL | Status: DC
  Administered 2016-10-07: 20 meq via ORAL
  Filled 2016-10-07 (×2): qty 1

## 2016-10-07 MED ORDER — ASPIRIN 81 MG PO CHEW
81.0000 mg | CHEWABLE_TABLET | Freq: Every day | ORAL | Status: DC
Start: 2016-10-07 — End: 2016-10-08
  Administered 2016-10-07: 81 mg via ORAL
  Filled 2016-10-07: qty 1

## 2016-10-07 MED ORDER — ZOLPIDEM TARTRATE 5 MG PO TABS
10.0000 mg | ORAL_TABLET | Freq: Every evening | ORAL | Status: DC | PRN
  Administered 2016-10-07: 10 mg via ORAL
  Filled 2016-10-07: qty 2

## 2016-10-07 MED ORDER — CARVEDILOL 3.125 MG PO TABS
3.1250 mg | ORAL_TABLET | Freq: Two times a day (BID) | ORAL | Status: DC
  Administered 2016-10-07 (×2): 3.125 mg via ORAL
  Filled 2016-10-07 (×4): qty 1

## 2016-10-07 MED ORDER — ATORVASTATIN CALCIUM 40 MG PO TABS
40.0000 mg | ORAL_TABLET | Freq: Every day | ORAL | Status: DC
  Administered 2016-10-07: 40 mg via ORAL
  Filled 2016-10-07: qty 1

## 2016-10-07 MED ORDER — HYDRALAZINE HCL 25 MG PO TABS
50.0000 mg | ORAL_TABLET | Freq: Three times a day (TID) | ORAL | Status: DC
  Administered 2016-10-07: 50 mg via ORAL
  Filled 2016-10-07: qty 2

## 2016-10-07 MED ORDER — CLOPIDOGREL BISULFATE 75 MG PO TABS
75.0000 mg | ORAL_TABLET | Freq: Every day | ORAL | Status: DC
  Administered 2016-10-07: 75 mg via ORAL
  Filled 2016-10-07: qty 1

## 2016-10-07 MED ORDER — TAMSULOSIN HCL 0.4 MG PO CAPS
0.4000 mg | ORAL_CAPSULE | Freq: Every day | ORAL | Status: DC
  Administered 2016-10-07: 0.4 mg via ORAL
  Filled 2016-10-07: qty 1

## 2016-10-07 NOTE — ED Notes (Signed)
Dinner at bedside, pts family member called and said she would come get him tomorrow that the power would be back on

## 2016-10-07 NOTE — ED Notes (Signed)
Pt resting quietly with family at bedside, denies any complaints.  Pt concerned about why we are drawing blood, instructed this rn will talk with edp before doing it

## 2016-10-07 NOTE — ED Notes (Signed)
Talked with edp about blood draw, states she needs to know his pt/inr before giving him his coumadin, instructed that to the patient.  He still refused states "I already know its okay, I keep a check on it and go next week, I do not need my blood drawn.". Will continue to monitor and will let edp know.

## 2016-10-07 NOTE — ED Notes (Signed)
Updated patient on waiting for home medications to be ordered by edp, lvad nurse at bedside

## 2016-10-07 NOTE — ED Notes (Signed)
Family at bedside, patient resting quietly, dinner ordered

## 2016-10-07 NOTE — ED Notes (Signed)
Awaiting coreg from pharmacy.

## 2016-10-07 NOTE — ED Notes (Signed)
Pt resting comfortably, lunch at bedside

## 2016-10-07 NOTE — ED Notes (Signed)
Per nurse pt refuse blood draw.

## 2016-10-08 ENCOUNTER — Other Ambulatory Visit (HOSPITAL_COMMUNITY): Payer: Self-pay | Admitting: Adult Health

## 2016-10-08 NOTE — ED Notes (Signed)
Patient 's family here state his power is now back on  And he is ready to go home.

## 2016-10-08 NOTE — ED Notes (Signed)
Eating breakfast , no complaints family at bedside.

## 2016-10-08 NOTE — ED Notes (Signed)
Patient is currently sleeping, still waiting on his power to be restored at home.

## 2016-10-08 NOTE — Discharge Planning (Signed)
EDCM and EDSW met with pt and family at bedside to discuss disposition plan.  Pt states his power has been restored and he is now able to return home.  Pt states his stay has been pleasant and is very appreciative of everyone.

## 2016-10-08 NOTE — Discharge Instructions (Signed)
Return to the ED with any concerns including difficulty breathing, fainting, or any other alarming symptoms  YOU SHOULD BE SURE TO CALL [facility] TO NOTIFY THAT YOU ARE A PRIORITY CLIENT BECAUSE OF MEDICAL CONDITIONS

## 2016-10-14 ENCOUNTER — Ambulatory Visit (HOSPITAL_COMMUNITY): Payer: Self-pay | Admitting: Unknown Physician Specialty

## 2016-10-14 ENCOUNTER — Encounter (HOSPITAL_COMMUNITY): Payer: Self-pay | Admitting: Unknown Physician Specialty

## 2016-10-14 ENCOUNTER — Ambulatory Visit (HOSPITAL_COMMUNITY)
Admission: RE | Admit: 2016-10-14 | Discharge: 2016-10-14 | Disposition: A | Discharge status: Home / Self Care | Payer: Medicare Other | Source: Ambulatory Visit | Attending: Internal Medicine | Admitting: Internal Medicine

## 2016-10-14 DIAGNOSIS — I5032 Chronic diastolic (congestive) heart failure: Secondary | ICD-10-CM | POA: Insufficient documentation

## 2016-10-14 DIAGNOSIS — N179 Acute kidney failure, unspecified: Secondary | ICD-10-CM

## 2016-10-14 DIAGNOSIS — X58XXXD Exposure to other specified factors, subsequent encounter: Secondary | ICD-10-CM | POA: Insufficient documentation

## 2016-10-14 DIAGNOSIS — I5022 Chronic systolic (congestive) heart failure: Secondary | ICD-10-CM | POA: Diagnosis not present

## 2016-10-14 DIAGNOSIS — T829XXD Unspecified complication of cardiac and vascular prosthetic device, implant and graft, subsequent encounter: Secondary | ICD-10-CM | POA: Diagnosis not present

## 2016-10-14 DIAGNOSIS — Z95811 Presence of heart assist device: Secondary | ICD-10-CM

## 2016-10-14 DIAGNOSIS — I5023 Acute on chronic systolic (congestive) heart failure: Secondary | ICD-10-CM

## 2016-10-14 LAB — BASIC METABOLIC PANEL
Anion gap: 9 (ref 5–15)
BUN: 40 mg/dL — ABNORMAL HIGH (ref 6–20)
CALCIUM: 9.4 mg/dL (ref 8.9–10.3)
CO2: 23 mmol/L (ref 22–32)
CREATININE: 2.35 mg/dL — AB (ref 0.61–1.24)
Chloride: 103 mmol/L (ref 101–111)
GFR, EST AFRICAN AMERICAN: 35 mL/min — AB (ref >60)
GFR, EST NON AFRICAN AMERICAN: 30 mL/min — AB (ref >60)
Glucose, Bld: 108 mg/dL — ABNORMAL HIGH (ref 65–99)
Potassium: 4.7 mmol/L (ref 3.5–5.1)
SODIUM: 135 mmol/L (ref 135–145)

## 2016-10-14 LAB — CBC
HCT: 40 % (ref 39.0–52.0)
Hemoglobin: 13.3 g/dL (ref 13.0–17.0)
MCH: 29.4 pg (ref 26.0–34.0)
MCHC: 33.3 g/dL (ref 30.0–36.0)
MCV: 88.3 fL (ref 78.0–100.0)
PLATELETS: 203 10*3/uL (ref 150–400)
RBC: 4.53 MIL/uL (ref 4.22–5.81)
RDW: 13.9 % (ref 11.5–15.5)
WBC: 3.6 10*3/uL — AB (ref 4.0–10.5)

## 2016-10-14 LAB — LACTATE DEHYDROGENASE: LDH: 438 U/L — AB (ref 98–192)

## 2016-10-14 LAB — PROTIME-INR
INR: 2.06
PROTHROMBIN TIME: 23.5 s — AB (ref 11.4–15.2)

## 2016-10-14 MED ORDER — ZOLPIDEM TARTRATE 10 MG PO TABS: 10.0000 mg | ORAL_TABLET | Freq: Every evening | ORAL | 3 refills | Status: AC | PRN

## 2016-10-14 NOTE — Progress Notes (Addendum)
Patient presents for 2 month  follow up in VAD Clinic today. Reports no problems with VAD equipment. Concerned with tangling of drive-line. Assisted with unraveling external drive-line and anchor placed. Patient and family educated about importance of maintaining the drive-lines integrity.  Vital Signs:  Doppler Pressure: 84   Automatc BP:  HR:77   SPO2:96  %  Weight: 184.6 lb w/o eqt Last weight: 190.4 lb  VAD Indication: Destination Therapy    VAD interrogation & Equipment Management: Speed:8600 Flow: 3.6 Power:4.2 w    PI:4.0  Alarms: no clinical alarms Events: 24-36 PI events daily  Fixed speed 8600 Low speed limit: 8000  Primary Controller:  Replace back up battery in 50months. Back up controller:   Replace back up battery in 24 months.    I reviewed the LVAD parameters from today and compared the results to the patient's prior recorded data. LVAD interrogation was NEGATIVE for significant power changes, NEGATIVE for clinical alarms and STABLE for PI events/speed drops. No programming changes were made and pump is functioning within specified parameters. Pt is performing daily controller and system monitor self tests along with completing weekly and monthly maintenance for LVAD equipment.  LVAD equipment check completed and is in good working order. Back-up equipment present. Charged back up battery and performed self-test on equipment.   Exit Site Care: Drive line is being maintained weekly  by family members. Drive line exit site well healed and incorporated. The velour is fully implanted at exit site. Dressing dry and intact. No erythema or drainage. Stabilization device present and accurately applied. Pt denies fever or chills. Pt states they have adequate dressing supplies at home. 8 weekly dressing change kits given.  Significant Events on VAD Support:  08/2013>> pump exchange at [facility] 06/2013>> CVA   BP & Labs:  [id number] - Doppler is reflecting  MAP  Hgb 13.3- No S/S of bleeding. Specifically denies melena/BRBPR or nosebleeds.  LDH stable at 438 above established baseline of 290- 350. Denies tea-colored urine. No power elevations noted on interrogation.   3 year Intermacs follow up completed including:  Quality of Life, KCCQ-12, and Neurocognitive trail making ( 30 secs).   Pt refused 6 minute walk.  Back up controller:  11V backup battery charged during this visit.   Plan: 1. Hold Torsemide tomorrow and Wednesday. 2. Take 1 and half tablet of coumadin tonight. 3. Return to clinic 5/2 for labs.  Return to clinic in 2 months  Carlton Adam RN VAD Coordinator   Office: [phone] 24/7 Emergency VAD Pager: [phone]

## 2016-10-16 ENCOUNTER — Other Ambulatory Visit (HOSPITAL_COMMUNITY): Payer: Self-pay | Admitting: Cardiology

## 2016-10-22 NOTE — Progress Notes (Signed)
Patient ID: [name], male   DOB: 04/20/1961, 56 y.o.   MRN: [id number]   ADVANCED HF CLINIC NOTE  Patient ID: [name], male   DOB: 26-Jan-1961, 57 y.o.   MRN: [id number]  HPI: [name] is a 56 yo male with a history of severe CHF, NICM s/p LVAD and TVR (09/2011), VT, NSTEMI, LV thrombus, CKD, GERD, stroke and LVAD hemolysis with LVAD exchange (08/2013).   He was admitted to [facility] January 2015 with NSTEMI and RV failure. He had a difficult course that was complicated by embolic right CVA. He was intubated and eventually had a tracheostomy placed 07/28/13. He was transferred to inpatient rehab for extensive rehab and was making progression, however despite aggressive therapy with heparin, coumadin, ASA and Plavix, his LDH remained persistently elevated in the 950 range (was about 1750 on admit). He did not experience any pump dysfunction, however the case was discussed with Dr Allena Katz and [name] at [facility] and it was felt to transfer him down there for possible pump exchange. He underwent a pump exchange 08/31/13. Post op was complicated by bleeding and he received multiple transfusions. He had AKI and had intermittent HD.  Admitted 5/13-5/18/15 for volume overload and possible hemolysis. LDH 647 and INR 1.69 on admission. Treated with IV lasix and IV heparin. While in the hospital developed some PI events and speed was decreased to 9400. ICD interrogated and showed A flutter and he was placed on IV amiodarone and then transitioned to 200 mg PO BID. He cardioverted to NSR on 5/15. LDH trended down and on discharge was 530 and INR 2.4.  In January 2016 admitted with multiple low flow alarms. Diuretics held and given IVF with improvement. Echo showed severe RV dysfunction  Admitted May 17,2016 from HF.LVAD clinic with volume overload. Diuresed with IV lasix.Multiple low flow alarms particularly in the am felt to be positional. Discharge weight was 175 pounds.    Ramp echo in 7/16, decreased speed to 8600 rpm.    Follow up for Heart Failure and LVAD:  Here with his family. Continues to feel well. He has been much more compliant with medicines and INR follow-up. Denies any HF symptoms. No edema, orthopnea or PND. Not very active but says he can do all activities without much difficulty. Taking torsemide 20 every day and occasionally takes extra. Not weighing himself routinely but weight up 5 pounds here. Says he has a good appetite.. No bleeding. No lightheadedness. He is performing his own dressing every week. Continues to struggle with twisting of driveline cable   Denies ICD shocks. Reports taking Coumadin as prescribed and adherence to anticoagulation based dietary restrictions.  Denies bright red blood per rectum or melena, no dark urine or hematuria.  VAD Indication: Destination Therapy    VAD interrogation & Equipment Management: Speed:8600 Flow: 3.6 Power:4.2 w PI:4.0  Alarms: no clinical alarms Events: 24-36 PI events daily  Fixed speed 8600 Low speed limit: 8000  Primary Controller: Replace back up battery in 21months. Back up controller: Replace back up battery in 35months.   SH: Disabled, lives at home with caregiver. No ETOH or smoking  FH: Mother living; HTN        Father living; no health issues    Past Medical History:  Diagnosis Date  . AICD (automatic cardioverter/defibrillator) present   . Bipolar affective disorder (HCC) 10/22/2011   pt denies this hx 02/26/2012  . CHF (congestive heart failure) (HCC)    EF- 10-15  . Chronic renal insufficiency   .  COMMON MIGRAINE 06/14/2009   Qualifier: Diagnosis of  By: Jonny Ruiz MD, Len Blalock   . Depression 08/11/2013   Pt denies  . Exertional dyspnea 02/26/2012  . GERD (gastroesophageal reflux disease)   . Hepatomegaly 09/16/2008   Qualifier: Diagnosis of  By: Wonda Amis    . High cholesterol 02/26/2012   "at one time"  . History of blood transfusion 08/2011   "when I had heart pump"  . History of gout 02/26/2012   . HTN (hypertension)   . ICD - IN SITU 09/16/2008   Qualifier: Diagnosis of  By: Wonda Amis    . LV (left ventricular) mural thrombus 01/28/2011  . Medically noncompliant   . Migraines   . Mitral regurgitation   . MITRAL STENOSIS/ INSUFFICIENCY, NON-RHEUMATIC 09/22/2008   Qualifier: Diagnosis of  By: Gala Romney, MD, Trixie Dredge Sleep apnea   . Substance abuse   . Syncope   . SYSTOLIC HEART FAILURE, CHRONIC 09/22/2008   Qualifier: Diagnosis of  By: Gala Romney, MD, Trixie Dredge Thrombus 08/06/2010  . Tobacco user     Current Outpatient Prescriptions  Medication Sig Dispense Refill  . aspirin 81 MG chewable tablet Chew 1 tablet (81 mg total) by mouth daily. 30 tablet 12  . atorvastatin (LIPITOR) 40 MG tablet TAKE 1 TABLET BY MOUTH EVERY DAY 90 tablet 3  . carvedilol (COREG) 3.125 MG tablet TAKE 1 TABLET BY MOUTH TWICE A DAY WITH A MEAL 180 tablet 3  . clopidogrel (PLAVIX) 75 MG tablet Take 1 tablet (75 mg total) by mouth daily with breakfast. 30 tablet 6  . hydrALAZINE (APRESOLINE) 50 MG tablet Take 1 tablet (50 mg total) by mouth 3 (three) times daily. 270 tablet 3  . KLOR-CON M20 20 MEQ tablet TAKE 2 TABLETS BY MOUTH TWICE A DAY (Patient taking differently: one tablet twice daily) 60 tablet 4  . losartan (COZAAR) 100 MG tablet TAKE 1 TABLET (100 MG TOTAL) BY MOUTH DAILY. 90 tablet 3  . tamsulosin (FLOMAX) 0.4 MG CAPS capsule TAKE 1 CAPSULE (0.4 MG TOTAL) BY MOUTH DAILY. 90 capsule 3  . torsemide (DEMADEX) 20 MG tablet Take 20 mg by mouth daily.    Marland Kitchen warfarin (COUMADIN) 4 MG tablet TAKE ONE TABLETONCE DAILY OR AS INSTRUCTED FOR INR 2.0 - 3.0 (Patient taking differently: Take 4 mg by mouth one time only at 6 PM. TAKE ONE TABLETONCE DAILY OR AS INSTRUCTED FOR INR 2.0 - 3.0) 140 tablet 1  . zolpidem (AMBIEN) 10 MG tablet Take 1 tablet (10 mg total) by mouth at bedtime as needed for sleep. 30 tablet 3  . clopidogrel (PLAVIX) 75 MG tablet TAKE 1 TABLET (75 MG TOTAL) BY MOUTH  DAILY WITH BREAKFAST. 30 tablet 4  . metolazone (ZAROXOLYN) 2.5 MG tablet Take 1 tablet (2.5 mg total) by mouth as needed (When MD tells you to take it). (Patient not taking: Reported on 10/14/2016) 10 tablet 3  . potassium chloride SA (K-DUR,KLOR-CON) 20 MEQ tablet Take 20 mEq by mouth daily.    . potassium chloride SA (KLOR-CON M20) 20 MEQ tablet Take 1 tablet (20 mEq total) by mouth 2 (two) times daily. (Patient not taking: Reported on 10/14/2016) 60 tablet 4  . torsemide (DEMADEX) 20 MG tablet TAKE 1 TABLET (20 MG TOTAL) BY MOUTH ONCE. 180 tablet 2   No current facility-administered medications for this encounter.    Facility-Administered Medications Ordered in Other Encounters  Medication Dose Route Frequency Provider Last  Rate Last Dose  . sodium chloride 0.9 % injection 10-40 mL  10-40 mL Intracatheter PRN [name], MD        Ace inhibitors and Lexapro [escitalopram oxalate]  REVIEW OF SYSTEMS: All systems negative except as listed in HPI, PMH and Problem list.  Vital Signs:  Doppler Pressure: 84               Automatc BP:  HR:77  SPO2:96  %  Weight: 184.6 lb w/o eqt Last weight: 190.4 lb   Physical Exam: GENERAL: Well appearing NAD HEENT: normal anicteric NECK: Supple, JVP flat  prominent superficial veins on R (suspect occluded IJ) No LAD or thyromegaly CARDIAC:  Mechanical heart sounds with LVAD hum present, normal HMII sounds LUNGS:  Clea no wheezing ABDOMEN:  Soft NT/ND good bowel sounds  LVAD exit site: Clean. Non tender. Stabilization device present and accurately applied. Dressing being changed weekly. No signs infection  Driveline twosted EXTREMITIES:  No cyanosis, clubbing, rash, warm. No edema NEUROLOGIC:  Alert and oriented x 4.  L-sided weakness is stable . Gait steady.  No aphasia.  Affect pleasant.      ASSESSMENT AND PLAN:   1) Chronic systolic HF: NICM, s/p LVAD implant (08/2011) and subsequent pump exchange (08/2013).   - Stable NYHA II -  Despite weight being up he looks dry today and creatinine up. Will hold torsemide for 2 days and told him to refrain from taking extra doses. Encouraged po intake.  -Recheck BMET 1 week  2) LVAD, s/p (08/2011) and then pump exchange (08/2013) due to pump thrombosis.  VAD interrogated personally. No alarms, overall stable. Extensive driveline twisting (see below) - Not a transplant candidate due to previous compliance issues and debility from CVA.  - LDH up slightly today but no dark urine or power spikes. Will follow  3) HTN:  -MAPs good.  Continue current regimen.  4) CVA: -Stable residual mild left sided weakness but functions well . No longer in therapy. Continue atorvastatin 40 mg daily.  5) h/o Atrial Flutter: now in NSR 6) Anticoagulation management:  -INR Goal 2.5-3.0. INR 2.1 today. He is on ASA 325 daily as well along with Plavix given history of CVA.  7) RV failure: stable 8) Driveline trauma/twisting:  - Again reminded him of the need to avoid mechanical trauma to line 9) AKI -Due to overdiuresis. As above, hold torsemide x 2 days. Limit extra doses. Push fluid. Repeat BMET 1 week.    Emitt Maglione,MD 11:30 PM

## 2016-10-23 ENCOUNTER — Ambulatory Visit (HOSPITAL_COMMUNITY): Payer: Self-pay | Admitting: Pharmacist

## 2016-10-23 ENCOUNTER — Other Ambulatory Visit (HOSPITAL_COMMUNITY): Payer: Self-pay | Admitting: *Deleted

## 2016-10-23 ENCOUNTER — Ambulatory Visit (HOSPITAL_COMMUNITY)
Admission: RE | Admit: 2016-10-23 | Discharge: 2016-10-23 | Disposition: A | Discharge status: Home / Self Care | Payer: Medicare Other | Source: Ambulatory Visit | Attending: Internal Medicine | Admitting: Internal Medicine

## 2016-10-23 DIAGNOSIS — Z95811 Presence of heart assist device: Secondary | ICD-10-CM

## 2016-10-23 DIAGNOSIS — Z5181 Encounter for therapeutic drug level monitoring: Secondary | ICD-10-CM

## 2016-10-23 LAB — LACTATE DEHYDROGENASE: LDH: 387 U/L — ABNORMAL HIGH (ref 98–192)

## 2016-10-23 LAB — PROTIME-INR
INR: 2.59
Prothrombin Time: 28.2 seconds — ABNORMAL HIGH (ref 11.4–15.2)

## 2016-10-23 LAB — BASIC METABOLIC PANEL
Anion gap: 9 (ref 5–15)
BUN: 20 mg/dL (ref 6–20)
CHLORIDE: 102 mmol/L (ref 101–111)
CO2: 26 mmol/L (ref 22–32)
Calcium: 9.5 mg/dL (ref 8.9–10.3)
Creatinine, Ser: 1.67 mg/dL — ABNORMAL HIGH (ref 0.61–1.24)
GFR calc Af Amer: 52 mL/min — ABNORMAL LOW (ref >60)
GFR, EST NON AFRICAN AMERICAN: 45 mL/min — AB (ref >60)
GLUCOSE: 99 mg/dL (ref 65–99)
POTASSIUM: 4.3 mmol/L (ref 3.5–5.1)
Sodium: 137 mmol/L (ref 135–145)

## 2016-11-04 ENCOUNTER — Encounter (HOSPITAL_COMMUNITY): Payer: Self-pay | Admitting: *Deleted

## 2016-11-04 ENCOUNTER — Inpatient Hospital Stay (HOSPITAL_COMMUNITY)
Admission: EM | Admit: 2016-11-04 | Discharge: 2016-11-22 | DRG: 085 | Disposition: E | Payer: Medicare Other | Attending: Internal Medicine | Admitting: Internal Medicine

## 2016-11-04 ENCOUNTER — Inpatient Hospital Stay (HOSPITAL_COMMUNITY): Payer: Medicare Other

## 2016-11-04 ENCOUNTER — Observation Stay (HOSPITAL_COMMUNITY): Payer: Medicare Other

## 2016-11-04 ENCOUNTER — Emergency Department (HOSPITAL_COMMUNITY): Payer: Medicare Other

## 2016-11-04 DIAGNOSIS — I619 Nontraumatic intracerebral hemorrhage, unspecified: Secondary | ICD-10-CM

## 2016-11-04 DIAGNOSIS — I5022 Chronic systolic (congestive) heart failure: Secondary | ICD-10-CM | POA: Diagnosis not present

## 2016-11-04 DIAGNOSIS — Z515 Encounter for palliative care: Secondary | ICD-10-CM | POA: Diagnosis not present

## 2016-11-04 DIAGNOSIS — I252 Old myocardial infarction: Secondary | ICD-10-CM | POA: Diagnosis not present

## 2016-11-04 DIAGNOSIS — G8194 Hemiplegia, unspecified affecting left nondominant side: Secondary | ICD-10-CM | POA: Diagnosis not present

## 2016-11-04 DIAGNOSIS — K219 Gastro-esophageal reflux disease without esophagitis: Secondary | ICD-10-CM | POA: Diagnosis not present

## 2016-11-04 DIAGNOSIS — I255 Ischemic cardiomyopathy: Secondary | ICD-10-CM | POA: Diagnosis not present

## 2016-11-04 DIAGNOSIS — Z95810 Presence of automatic (implantable) cardiac defibrillator: Secondary | ICD-10-CM

## 2016-11-04 DIAGNOSIS — I629 Nontraumatic intracranial hemorrhage, unspecified: Secondary | ICD-10-CM

## 2016-11-04 DIAGNOSIS — I69354 Hemiplegia and hemiparesis following cerebral infarction affecting left non-dominant side: Secondary | ICD-10-CM

## 2016-11-04 DIAGNOSIS — Z66 Do not resuscitate: Secondary | ICD-10-CM | POA: Diagnosis present

## 2016-11-04 DIAGNOSIS — I5023 Acute on chronic systolic (congestive) heart failure: Secondary | ICD-10-CM | POA: Diagnosis not present

## 2016-11-04 DIAGNOSIS — I130 Hypertensive heart and chronic kidney disease with heart failure and stage 1 through stage 4 chronic kidney disease, or unspecified chronic kidney disease: Secondary | ICD-10-CM | POA: Diagnosis present

## 2016-11-04 DIAGNOSIS — Z7901 Long term (current) use of anticoagulants: Secondary | ICD-10-CM

## 2016-11-04 DIAGNOSIS — Z888 Allergy status to other drugs, medicaments and biological substances status: Secondary | ICD-10-CM | POA: Diagnosis not present

## 2016-11-04 DIAGNOSIS — N182 Chronic kidney disease, stage 2 (mild): Secondary | ICD-10-CM | POA: Diagnosis not present

## 2016-11-04 DIAGNOSIS — Z7982 Long term (current) use of aspirin: Secondary | ICD-10-CM | POA: Diagnosis not present

## 2016-11-04 DIAGNOSIS — I052 Rheumatic mitral stenosis with insufficiency: Secondary | ICD-10-CM | POA: Diagnosis present

## 2016-11-04 DIAGNOSIS — W01198A Fall on same level from slipping, tripping and stumbling with subsequent striking against other object, initial encounter: Secondary | ICD-10-CM | POA: Diagnosis present

## 2016-11-04 DIAGNOSIS — G9340 Encephalopathy, unspecified: Secondary | ICD-10-CM | POA: Diagnosis not present

## 2016-11-04 DIAGNOSIS — Z7902 Long term (current) use of antithrombotics/antiplatelets: Secondary | ICD-10-CM

## 2016-11-04 DIAGNOSIS — R200 Anesthesia of skin: Secondary | ICD-10-CM

## 2016-11-04 DIAGNOSIS — Z95811 Presence of heart assist device: Secondary | ICD-10-CM | POA: Diagnosis not present

## 2016-11-04 DIAGNOSIS — Z87891 Personal history of nicotine dependence: Secondary | ICD-10-CM

## 2016-11-04 DIAGNOSIS — E7800 Pure hypercholesterolemia, unspecified: Secondary | ICD-10-CM | POA: Diagnosis present

## 2016-11-04 DIAGNOSIS — S066X0A Traumatic subarachnoid hemorrhage without loss of consciousness, initial encounter: Secondary | ICD-10-CM | POA: Diagnosis not present

## 2016-11-04 DIAGNOSIS — I679 Cerebrovascular disease, unspecified: Secondary | ICD-10-CM | POA: Diagnosis not present

## 2016-11-04 DIAGNOSIS — F319 Bipolar disorder, unspecified: Secondary | ICD-10-CM | POA: Diagnosis present

## 2016-11-04 DIAGNOSIS — R51 Headache: Secondary | ICD-10-CM | POA: Diagnosis not present

## 2016-11-04 DIAGNOSIS — G935 Compression of brain: Secondary | ICD-10-CM | POA: Diagnosis not present

## 2016-11-04 DIAGNOSIS — R079 Chest pain, unspecified: Secondary | ICD-10-CM | POA: Diagnosis not present

## 2016-11-04 DIAGNOSIS — S065X0S Traumatic subdural hemorrhage without loss of consciousness, sequela: Secondary | ICD-10-CM | POA: Diagnosis not present

## 2016-11-04 DIAGNOSIS — I610 Nontraumatic intracerebral hemorrhage in hemisphere, subcortical: Secondary | ICD-10-CM

## 2016-11-04 LAB — COMPREHENSIVE METABOLIC PANEL
ALBUMIN: 4.4 g/dL (ref 3.5–5.0)
ALT: 28 U/L (ref 17–63)
AST: 29 U/L (ref 15–41)
Alkaline Phosphatase: 115 U/L (ref 38–126)
Anion gap: 10 (ref 5–15)
BUN: 14 mg/dL (ref 6–20)
CHLORIDE: 106 mmol/L (ref 101–111)
CO2: 22 mmol/L (ref 22–32)
CREATININE: 1.41 mg/dL — AB (ref 0.61–1.24)
Calcium: 9.5 mg/dL (ref 8.9–10.3)
GFR calc Af Amer: >60 mL/min (ref >60)
GFR, EST NON AFRICAN AMERICAN: 55 mL/min — AB (ref >60)
GLUCOSE: 106 mg/dL — AB (ref 65–99)
Potassium: 4.2 mmol/L (ref 3.5–5.1)
Sodium: 138 mmol/L (ref 135–145)
Total Bilirubin: 1.7 mg/dL — ABNORMAL HIGH (ref 0.3–1.2)
Total Protein: 7.9 g/dL (ref 6.5–8.1)

## 2016-11-04 LAB — CBC
HCT: 40.8 % (ref 39.0–52.0)
Hemoglobin: 13.6 g/dL (ref 13.0–17.0)
MCH: 30.3 pg (ref 26.0–34.0)
MCHC: 33.3 g/dL (ref 30.0–36.0)
MCV: 90.9 fL (ref 78.0–100.0)
Platelets: 239 10*3/uL (ref 150–400)
RBC: 4.49 MIL/uL (ref 4.22–5.81)
RDW: 14.4 % (ref 11.5–15.5)
WBC: 5.7 10*3/uL (ref 4.0–10.5)

## 2016-11-04 LAB — I-STAT TROPONIN, ED: Troponin i, poc: 0 ng/mL (ref 0.00–0.08)

## 2016-11-04 LAB — MRSA PCR SCREENING: MRSA by PCR: NEGATIVE

## 2016-11-04 LAB — PROTIME-INR
INR: 2.75
PROTHROMBIN TIME: 29.6 s — AB (ref 11.4–15.2)

## 2016-11-04 LAB — LACTATE DEHYDROGENASE: LDH: 385 U/L — AB (ref 98–192)

## 2016-11-04 MED ORDER — ATORVASTATIN CALCIUM 40 MG PO TABS: 40.0000 mg | ORAL_TABLET | Freq: Every day | ORAL | Status: DC

## 2016-11-04 MED ORDER — SODIUM CHLORIDE 0.9 % IV SOLN: INTRAVENOUS | Status: DC

## 2016-11-04 MED ORDER — POTASSIUM CHLORIDE CRYS ER 20 MEQ PO TBCR
20.0000 meq | EXTENDED_RELEASE_TABLET | Freq: Two times a day (BID) | ORAL | Status: DC
  Filled 2016-11-04: qty 1

## 2016-11-04 MED ORDER — OXYCODONE-ACETAMINOPHEN 5-325 MG PO TABS
1.0000 | ORAL_TABLET | Freq: Once | ORAL | Status: AC
  Administered 2016-11-04: 1 via ORAL
  Filled 2016-11-04: qty 1

## 2016-11-04 MED ORDER — PNEUMOCOCCAL VAC POLYVALENT 25 MCG/0.5ML IJ INJ: 0.5000 mL | INJECTION | INTRAMUSCULAR | Status: DC | PRN

## 2016-11-04 MED ORDER — ZOLPIDEM TARTRATE 5 MG PO TABS: 10.0000 mg | ORAL_TABLET | Freq: Every evening | ORAL | Status: DC | PRN

## 2016-11-04 MED ORDER — ACETAMINOPHEN 325 MG PO TABS
650.0000 mg | ORAL_TABLET | ORAL | Status: DC | PRN
  Administered 2016-11-04: 650 mg via ORAL
  Filled 2016-11-04: qty 2

## 2016-11-04 MED ORDER — CARVEDILOL 3.125 MG PO TABS
3.1250 mg | ORAL_TABLET | Freq: Two times a day (BID) | ORAL | Status: DC
  Administered 2016-11-04: 3.125 mg via ORAL
  Filled 2016-11-04: qty 1

## 2016-11-04 MED ORDER — SODIUM CHLORIDE 0.9 % IV SOLN
Freq: Once | INTRAVENOUS | Status: AC
  Administered 2016-11-05: 01:00:00 via INTRAVENOUS

## 2016-11-04 MED ORDER — PROTHROMBIN COMPLEX CONC HUMAN 500 UNITS IV KIT
2000.0000 [IU] | PACK | Status: AC
  Administered 2016-11-04: 2000 [IU] via INTRAVENOUS
  Filled 2016-11-04: qty 80

## 2016-11-04 MED ORDER — LOSARTAN POTASSIUM 50 MG PO TABS: 100.0000 mg | ORAL_TABLET | Freq: Every day | ORAL | Status: DC

## 2016-11-04 MED ORDER — HYDRALAZINE HCL 50 MG PO TABS
50.0000 mg | ORAL_TABLET | Freq: Three times a day (TID) | ORAL | Status: DC
  Administered 2016-11-04: 50 mg via ORAL
  Filled 2016-11-04 (×2): qty 1

## 2016-11-04 MED ORDER — TAMSULOSIN HCL 0.4 MG PO CAPS
0.4000 mg | ORAL_CAPSULE | Freq: Every day | ORAL | Status: DC
  Administered 2016-11-04: 0.4 mg via ORAL
  Filled 2016-11-04: qty 1

## 2016-11-04 MED ORDER — ONDANSETRON HCL 4 MG/2ML IJ SOLN: 4.0000 mg | Freq: Four times a day (QID) | INTRAMUSCULAR | Status: DC | PRN

## 2016-11-04 MED ORDER — VITAMIN K1 10 MG/ML IJ SOLN
10.0000 mg | INTRAVENOUS | Status: AC
  Administered 2016-11-05: 10 mg via INTRAVENOUS
  Filled 2016-11-04: qty 1

## 2016-11-04 NOTE — Progress Notes (Addendum)
CT called to verify scheduled scan at 22:00 and that we will require transport assist

## 2016-11-04 NOTE — ED Notes (Signed)
Dr. Oster at bedside  

## 2016-11-04 NOTE — Consult Note (Signed)
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 History is obtained directly from the patient who is a fair historian. His mother and another family member are present at the bedside and offer additional information as needed.   The patient reportedly fell about one week ago. He was standing up and dropped his remote control. When he leaned over to pick it up, he fell over, striking his head. He has had a headache since this fall and feels that it had been getting worse over the past week. He had a stroke in the past and has had residual left-sided weakness and numbness since. He feels that these are at his baseline and denies any worsening of his baseline deficits.   On reviewing his chart, he was admitted to [facility] in February 2015 after presenting with left-sided weakness. He was found to have acute ischemic infarcts involving the left thalamus and bilateral basal ganglia and these were felt to be cardioembolic in nature. He was discharged on aspirin, Plavix, and warfarin which he had continued to take. He denies missing any doses and states he had not taken any extra.   PMH:  Past Medical History:  Diagnosis Date  . AICD (automatic cardioverter/defibrillator) present   . Bipolar affective disorder (HCC) 10/22/2011   pt denies this hx 02/26/2012  . CHF (congestive heart failure) (HCC)    EF- 10-15  . Chronic renal insufficiency   . COMMON MIGRAINE 06/14/2009   Qualifier: Diagnosis of  By: Jonny Ruiz MD, Len Blalock   . Depression 08/11/2013   Pt denies  . Exertional dyspnea 02/26/2012  . GERD (gastroesophageal reflux disease)   . Hepatomegaly 09/16/2008   Qualifier: Diagnosis of  By: Wonda Amis    . High cholesterol 02/26/2012   "at one time"  . History of blood transfusion 08/2011    "when I had heart pump"  . History of gout 02/26/2012  . HTN (hypertension)   . ICD - IN SITU 09/16/2008   Qualifier: Diagnosis of  By: Wonda Amis    . LV (left ventricular) mural thrombus 01/28/2011  . Medically noncompliant   . Migraines   . Mitral regurgitation   . MITRAL STENOSIS/ INSUFFICIENCY, NON-RHEUMATIC 09/22/2008   Qualifier: Diagnosis of  By: Gala Romney, MD, Trixie Dredge Sleep apnea   . Substance abuse   . Syncope   . SYSTOLIC HEART FAILURE, CHRONIC 09/22/2008   Qualifier: Diagnosis of  By: Gala Romney, MD, Trixie Dredge Thrombus 08/06/2010  . Tobacco user     PSH:  Past Surgical History:  Procedure Laterality Date  . CARDIAC DEFIBRILLATOR PLACEMENT  ~ 2008  . INTRA-AORTIC BALLOON PUMP INSERTION  09/09/2011   Procedure: INTRA-AORTIC BALLOON PUMP INSERTION;  Surgeon: Dolores Patty, MD;  Location: [facility] CATH LAB;  Service: Cardiovascular;;  . LEFT AND RIGHT HEART CATHETERIZATION WITH CORONARY ANGIOGRAM N/A 07/19/2013   Procedure: LEFT AND RIGHT HEART CATHETERIZATION WITH CORONARY ANGIOGRAM;  Surgeon: Dolores Patty, MD;  Location: [facility] CATH LAB;  Service: Cardiovascular;  Laterality: N/A;  . LEFT VENTRICULAR ASSIST DEVICE  08/2011  . RADIOLOGY WITH ANESTHESIA N/A 07/19/2013   Procedure: RADIOLOGY WITH ANESTHESIA;  Surgeon: Oneal Grout, MD;  Location: [facility] OR;  Service: Radiology;  Laterality: N/A;  .  RIGHT HEART CATHETERIZATION N/A 07/08/2011   Procedure: RIGHT HEART CATH;  Surgeon: Dolores Patty, MD;  Location: [facility] CATH LAB;  Service: Cardiovascular;  Laterality: N/A;  . RIGHT HEART CATHETERIZATION N/A 09/09/2011   Procedure: RIGHT HEART CATH;  Surgeon: Dolores Patty, MD;  Location: [facility] CATH LAB;  Service: Cardiovascular;  Laterality: N/A;  . TRACHEOSTOMY     feinstein    Family history: Family History  Problem Relation Age of Onset  . Coronary artery disease Neg Hx     Social history:  Social History   Social History  . Marital  status: Divorced    Spouse name: N/A  . Number of children: N/A  . Years of education: N/A   Occupational History  . Not on file.   Social History Main Topics  . Smoking status: Former Smoker    Packs/day: 0.25    Years: 33.00    Types: Cigarettes    Quit date: 06/29/2011  . Smokeless tobacco: Former Neurosurgeon    Quit date: 06/29/2011  . Alcohol use No  . Drug use: No     Comment: denies  . Sexual activity: Not Currently   Other Topics Concern  . Not on file   Social History Narrative  . No narrative on file    Current outpatient meds: Medications reviewed and reconciled.  Current Meds  Medication Sig  . acetaminophen (TYLENOL) 325 MG tablet Take 650 mg by mouth every 6 (six) hours as needed for mild pain.  Marland Kitchen aspirin 81 MG chewable tablet Chew 1 tablet (81 mg total) by mouth daily.  Marland Kitchen atorvastatin (LIPITOR) 40 MG tablet TAKE 1 TABLET BY MOUTH EVERY DAY  . carvedilol (COREG) 3.125 MG tablet TAKE 1 TABLET BY MOUTH TWICE A DAY WITH A MEAL  . Chlorphen-Phenyleph-APAP (CORICIDIN D COLD/FLU/SINUS) 2-5-325 MG TABS Take 1 tablet by mouth daily as needed (cough).  . clopidogrel (PLAVIX) 75 MG tablet TAKE 1 TABLET (75 MG TOTAL) BY MOUTH DAILY WITH BREAKFAST.  . hydrALAZINE (APRESOLINE) 50 MG tablet Take 1 tablet (50 mg total) by mouth 3 (three) times daily.  Marland Kitchen losartan (COZAAR) 100 MG tablet TAKE 1 TABLET (100 MG TOTAL) BY MOUTH DAILY.  Marland Kitchen potassium chloride SA (KLOR-CON M20) 20 MEQ tablet Take 1 tablet (20 mEq total) by mouth 2 (two) times daily.  . tamsulosin (FLOMAX) 0.4 MG CAPS capsule TAKE 1 CAPSULE (0.4 MG TOTAL) BY MOUTH DAILY.  Marland Kitchen torsemide (DEMADEX) 20 MG tablet Take 20 mg by mouth daily.  Marland Kitchen warfarin (COUMADIN) 4 MG tablet Take 4 mg by mouth daily.  Marland Kitchen zolpidem (AMBIEN) 10 MG tablet Take 1 tablet (10 mg total) by mouth at bedtime as needed for sleep.    Current inpatient meds: Medications reviewed and reconciled.  No current facility-administered medications for this encounter.     Current Outpatient Prescriptions  Medication Sig Dispense Refill  . acetaminophen (TYLENOL) 325 MG tablet Take 650 mg by mouth every 6 (six) hours as needed for mild pain.    Marland Kitchen aspirin 81 MG chewable tablet Chew 1 tablet (81 mg total) by mouth daily. 30 tablet 12  . atorvastatin (LIPITOR) 40 MG tablet TAKE 1 TABLET BY MOUTH EVERY DAY 90 tablet 3  . carvedilol (COREG) 3.125 MG tablet TAKE 1 TABLET BY MOUTH TWICE A DAY WITH A MEAL 180 tablet 3  . Chlorphen-Phenyleph-APAP (CORICIDIN D COLD/FLU/SINUS) 2-5-325 MG TABS Take 1 tablet by mouth daily as needed (cough).    . clopidogrel (PLAVIX) 75 MG tablet TAKE  1 TABLET (75 MG TOTAL) BY MOUTH DAILY WITH BREAKFAST. 30 tablet 4  . hydrALAZINE (APRESOLINE) 50 MG tablet Take 1 tablet (50 mg total) by mouth 3 (three) times daily. 270 tablet 3  . losartan (COZAAR) 100 MG tablet TAKE 1 TABLET (100 MG TOTAL) BY MOUTH DAILY. 90 tablet 3  . potassium chloride SA (KLOR-CON M20) 20 MEQ tablet Take 1 tablet (20 mEq total) by mouth 2 (two) times daily. 60 tablet 4  . tamsulosin (FLOMAX) 0.4 MG CAPS capsule TAKE 1 CAPSULE (0.4 MG TOTAL) BY MOUTH DAILY. 90 capsule 3  . torsemide (DEMADEX) 20 MG tablet Take 20 mg by mouth daily.    Marland Kitchen warfarin (COUMADIN) 4 MG tablet Take 4 mg by mouth daily.    Marland Kitchen zolpidem (AMBIEN) 10 MG tablet Take 1 tablet (10 mg total) by mouth at bedtime as needed for sleep. 30 tablet 3   Facility-Administered Medications Ordered in Other Encounters  Medication Dose Route Frequency Provider Last Rate Last Dose  . sodium chloride 0.9 % injection 10-40 mL  10-40 mL Intracatheter PRN [name], MD        Allergies: Allergies  Allergen Reactions  . Ace Inhibitors Cough  . Lexapro [Escitalopram Oxalate] Other (See Comments)    somnolence    ROS: As per HPI. A full 14-point review of systems was performed and is otherwise notable for Mild left-sided chest pain without radiation which has been present for the past week. He has chronic  cough. The remainder of the 14 point review systems is negative.  PE:  BP (!) 82/68   Pulse 81   Temp 99.1 F (37.3 C)   Resp 10   Ht 5\' 4"  (1.626 m)   Wt 84.8 kg (187 lb)   SpO2 97%   BMI 32.10 kg/m   General: WN African-American man lying on ED gurney. He complains of headache and chest pain. He has frequent coughing. He is AAO x4. Speech clear, no dysarthria. No aphasia. Follows commands briskly. Affect is bright with congruent mood. Comportment is normal.  HEENT: Normocephalic. Neck supple without LAD. MMM, OP clear. Dentition good. Sclerae anicteric. No conjunctival injection.  CV: The electronic hum of the LVAD is noted. Mechanical heart sounds present. Carotid pulses full and symmetric, no bruits. Distal pulses 2+ and symmetric. Well-healed surgical incision noted over the sternum. Lungs: CTAB on anterior examination.  Abdomen: Soft, obese, non-distended, non-tender. Bowel sounds present x4.  Extremities: No C/C/E. Neuro:  CN: Pupils are equal and round. They are symmetrically reactive from 3-->2 mm. EOMI without nystagmus. He has some breakup of smooth pursuits. No reported diplopia. Facial sensation is intact to light touch. Face is notable for some mild weakness on the left side consistent with a central pattern of weakness. This is reportedly at his baseline. Hearing is intact to conversational voice. Palate elevates symmetrically and uvula is midline. Voice is normal in tone, pitch and quality. Bilateral SCM and trapezii are 5/5. Tongue is midline with normal bulk and mobility.  Motor: Normal bulk, tone. He has a mild left hemiparesis which he reports is chronic and at baseline. No tremor or other abnormal movements.  Sensation: Light touch is diminished over the left arm, otherwise intact.  DTRs: 2+ on the right, 3+ on the left. Toes downgoing on the right, upgoing on the left.  Coordination: Finger to nose is without dysmetria but is slower and more clumsy on the left side due  to his hemiparesis. Finger taps are slower  on the left.     Labs:  Lab Results  Component Value Date   WBC 5.7 11/16/2016   HGB 13.6 11/19/2016   HCT 40.8 11/08/2016   PLT 239 10/28/2016   GLUCOSE 106 (H) 11/12/2016   CHOL 166 07/20/2013   TRIG 96 07/20/2013   HDL 63 07/20/2013   LDLDIRECT 90.4 03/10/2009   LDLCALC 84 07/20/2013   ALT 28 10/27/2016   AST 29 11/12/2016   NA 138 11/12/2016   K 4.2 11/14/2016   CL 106 11/01/2016   CREATININE 1.41 (H) 10/22/2016   BUN 14 11/21/2016   CO2 22 10/31/2016   TSH 5.690 (H) 03/14/2014   PSA 0.86 03/10/2009   INR 2.75 10/27/2016   HGBA1C 5.7 (H) 07/19/2013   Troponin 0.00 LDH 385  Imaging:  I have personally and independently reviewed the CT scan of the head without contrast from today. This shows a large intracerebral hemorrhage in the right temporal lobe. This measures 2.8 x 2.6 x 3.0 cm with estimated volume 15 mL. There is a mild degree of hypodensity surrounding the hematoma consistent with vasogenic edema. This results in some localized mass effect upon the right lateral ventricle and roughly 5 mm of right to left midline shift. The hemorrhage extends into the subarachnoid space in the right temporal region with a smaller amount of subarachnoid hemorrhage in the left frontal area. There is no hydrocephalus. Old infarctions are noted in the left basal ganglia and right pons. Mild to moderate chronic small vessel ischemic disease seen in the bihemispheric white matter. No obvious acute ischemia is apparent.  Assessment and Plan:  1. Acute ICH: Based upon imaging appearance, this is most likely hypertensive in etiology. I cannot the possibility that his recent fall may have contributed, particularly given that he is on aspirin, Plavix, and warfarin with therapeutic INR. Other potential considerations include spontaneous hemorrhage due to antiplatelet and anticoagulation use, sympathomimetic abuse, and less likely AVM or aneurysm. Coags  notable for INR of 2/75. Urine drug screen not checked, will order. Monitor blood pressure, goal SBP 140-160 mmHg. Avoid antiplatelet agents and NSAIDs for the 1-2 weeks. Avoid therapeutic systemic anticoagulation for the next 3-4 weeks. Given his underlying NICM/CHF with LVAD and the associated risk of thrombosis, I will not reverse his INR and would favor allowing this to drift down on its own unless he shows evidence of worsening of his bleed. Repeat CTH in 12 hours, sooner if exam worsens. Consider CTA head but will hold off for now given low suspicion for AVM/aneurysm and baseline renal impairment. SCDs for DVT prophylaxis. Avoid fever and hyperglycemia as these are associated with worse neurologic prognosis. Avoid hypotonic IVF to minimize the risk of exacerbating cerebral edema. Frequent neuro checks.   ICH score: 0  2. Cerebrovascular disease: He has a prior history of cardioembolic stroke as well as small vessel disease on imaging. Known risk factors include CHF/NICM with EF 10-15%, hyperlipidemia, HTN, obesity, OSA, prior stroke. He was on aspirin, Plavix and warfarin at baseline but these are being held in setting of acute ICH. Resume once able, usual recommendations as above. Follow BP, initial goal SBP <140-160 mmHg given ICH. Continue statin, goal LDL less than 70. Ensure adequate glucose control longterm.   3. Left hemiparesis: This is chronic, due to prior stroke. While increased weakness is possible given his hemorrhage, he denies this and feels that he is at his usual baseline. PT/OT/rehab as needed.   4. LUE numbness: This is chronic,  due to prior stroke. He states he is at his usual baseline and denies any increase today. OT/rehab.   5. Headache: This is mild, likely due to ICH. Symptomatic treatment as needed. Will need to balance pain meds carefully to minimize sedation so that we can continue to follow his neurologic exam. Must also be cautious with drug selection given h/o substance  abuse. Will give percocet 5/325 x1 here in ED, will defer further treatment to admitting team.   This was discussed with the patient and his family. Education was provided on the diagnosis and expected evaluation and treatment. They are in agreement with the plan as noted. They were given the opportunity to ask any questions and these were addressed to their satisfaction.   Thank you for this consultation. The stroke team will assume care of the patient beginning 11/11/2016. Please call with any urgent questions or concerns.

## 2016-11-04 NOTE — H&P (Signed)
Advanced Heart Failure VAD History and Physical Note   Reason for Admission: Fall, cough.    HPI:    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  He presented to the ED on 11/13/2016 with cough and back pain. He also reported that he had fallen at home on 10/29/16, he hit his head on the wall and electrical socket. He had a low grade temp on arrival at 99.1. He feels SOB and started having a cough about 2 weeks ago productive of white sputum. He denies fever and chills at home. He has had a headache since his fall, no problems swallowing, but endorses blurred vision. His family noted that his gait has been altered the past week. He has a good appetite, no hematuria, melena or hematochezia.   Stat head CT showed L sided hemorrhage.   LVAD INTERROGATION:  HeartMate II LVAD:  Flow 3.8 liters/min, speed 8600, power 4.2, PI 5.2.  Less than 10 PI events a day, no low flow alarms.    Review of Systems: [y] = yes, [ ]  = no   General: Weight gain [ ] ; Weight loss [ ] ; Anorexia [ ] ; Fatigue [ ] ; Fever [ ] ; Chills [ ] ; Weakness [ ]   Cardiac: Chest pain/pressure [ y]; Resting SOB [ ] ; Exertional SOB [ ] ; Orthopnea [ ] ; Pedal Edema [ ] ; Palpitations [ ] ; Syncope [ ] ; Presyncope [ ] ; Paroxysmal nocturnal dyspnea[ ]   Pulmonary: Cough [place] ]; Wheezing[ ] ; Hemoptysis[ ] ; Sputum [ ] ; Snoring [ ]   GI: Vomiting[ ] ; Dysphagia[ ] ; Melena[ ] ; Hematochezia [ ] ; Heartburn[ ] ; Abdominal pain [ ] ; Constipation [ ] ; Diarrhea [ ] ; BRBPR [ ]   GU: Hematuria[ ] ; Dysuria [ ] ; Nocturia[ ]   Vascular: Pain in legs with walking [ ] ; Pain in feet with lying flat [ ] ; Non-healing sores [ ] ; Stroke [ ] ; TIA [ ] ; Slurred speech [ ] ;  Neuro: Headaches[y ]; Vertigo[ ] ; Seizures[ ] ; Paresthesias[ ] ;Blurred vision [ ] ; Diplopia [ ] ; Vision changes [ ]     Ortho/Skin: Arthritis [ ] ; Joint pain [ ] ; Muscle pain [ ] ; Joint swelling [ ] ; Back Pain [ ] ; Rash [ ]   Psych: Depression[ ] ; Anxiety[ ]   Heme: Bleeding problems [ ] ; Clotting disorders [ ] ; Anemia [ ]   Endocrine: Diabetes [ ] ; Thyroid dysfunction[ ]     Home Medications Prior to Admission medications   Medication Sig Start Date End Date Taking? Authorizing Provider  aspirin 81 MG chewable tablet Chew 1 tablet (81 mg total) by mouth daily. 02/13/16   Bensimhon, Bevelyn Buckles, MD  atorvastatin (LIPITOR) 40 MG tablet TAKE 1 TABLET BY MOUTH EVERY DAY 07/15/16   Laurey Morale, MD  carvedilol (COREG) 3.125 MG tablet TAKE 1 TABLET BY MOUTH TWICE A DAY WITH A MEAL 06/05/16   Laurey Morale, MD  clopidogrel (PLAVIX) 75 MG tablet Take 1 tablet (75 mg total) by mouth daily with breakfast. 06/13/15   Laurey Morale, MD  clopidogrel (PLAVIX) 75 MG tablet TAKE 1 TABLET (75 MG TOTAL) BY MOUTH DAILY WITH BREAKFAST. 10/16/16   Bensimhon, Bevelyn Buckles, MD  hydrALAZINE (APRESOLINE) 50 MG tablet Take 1 tablet (50 mg total) by mouth 3 (three) times daily. 12/25/15   Bensimhon, Bevelyn Buckles, MD  losartan (COZAAR) 100 MG tablet TAKE  1 TABLET (100 MG TOTAL) BY MOUTH DAILY. 07/15/16   Laurey Morale, MD  metolazone (ZAROXOLYN) 2.5 MG tablet Take 1 tablet (2.5 mg total) by mouth as needed (When MD tells you to take it). Patient not taking: Reported on 10/14/2016 05/09/15   Bensimhon, Bevelyn Buckles, MD  potassium chloride SA (KLOR-CON M20) 20 MEQ tablet Take 1 tablet (20 mEq total) by mouth 2 (two) times daily. 10/09/16   Bensimhon, Bevelyn Buckles, MD  tamsulosin (FLOMAX) 0.4 MG CAPS capsule TAKE 1 CAPSULE (0.4 MG TOTAL) BY MOUTH DAILY. 07/15/16   Laurey Morale, MD  torsemide (DEMADEX) 20 MG tablet Take 20 mg by mouth daily.    [provider]  warfarin (COUMADIN) 4 MG tablet Take 4 mg by mouth daily.    [provider]  zolpidem (AMBIEN) 10 MG tablet Take 1 tablet (10 mg total) by mouth at bedtime as needed for sleep.  10/14/16   Bensimhon, Bevelyn Buckles, MD    Past Medical History: Past Medical History:  Diagnosis Date  . AICD (automatic cardioverter/defibrillator) present   . Bipolar affective disorder (HCC) 10/22/2011   pt denies this hx 02/26/2012  . CHF (congestive heart failure) (HCC)    EF- 10-15  . Chronic renal insufficiency   . COMMON MIGRAINE 06/14/2009   Qualifier: Diagnosis of  By: Jonny Ruiz MD, Len Blalock   . Depression 08/11/2013   Pt denies  . Exertional dyspnea 02/26/2012  . GERD (gastroesophageal reflux disease)   . Hepatomegaly 09/16/2008   Qualifier: Diagnosis of  By: Wonda Amis    . High cholesterol 02/26/2012   "at one time"  . History of blood transfusion 08/2011   "when I had heart pump"  . History of gout 02/26/2012  . HTN (hypertension)   . ICD - IN SITU 09/16/2008   Qualifier: Diagnosis of  By: Wonda Amis    . LV (left ventricular) mural thrombus 01/28/2011  . Medically noncompliant   . Migraines   . Mitral regurgitation   . MITRAL STENOSIS/ INSUFFICIENCY, NON-RHEUMATIC 09/22/2008   Qualifier: Diagnosis of  By: Gala Romney, MD, Trixie Dredge Sleep apnea   . Substance abuse   . Syncope   . SYSTOLIC HEART FAILURE, CHRONIC 09/22/2008   Qualifier: Diagnosis of  By: Gala Romney, MD, Trixie Dredge Thrombus 08/06/2010  . Tobacco user     Past Surgical History: Past Surgical History:  Procedure Laterality Date  . CARDIAC DEFIBRILLATOR PLACEMENT  ~ 2008  . INTRA-AORTIC BALLOON PUMP INSERTION  09/09/2011   Procedure: INTRA-AORTIC BALLOON PUMP INSERTION;  Surgeon: Dolores Patty, MD;  Location: [facility] CATH LAB;  Service: Cardiovascular;;  . LEFT AND RIGHT HEART CATHETERIZATION WITH CORONARY ANGIOGRAM N/A 07/19/2013   Procedure: LEFT AND RIGHT HEART CATHETERIZATION WITH CORONARY ANGIOGRAM;  Surgeon: Dolores Patty, MD;  Location: [facility] CATH LAB;  Service: Cardiovascular;  Laterality: N/A;  . LEFT VENTRICULAR ASSIST DEVICE  08/2011  . RADIOLOGY WITH ANESTHESIA N/A  07/19/2013   Procedure: RADIOLOGY WITH ANESTHESIA;  Surgeon: Oneal Grout, MD;  Location: [facility] OR;  Service: Radiology;  Laterality: N/A;  . RIGHT HEART CATHETERIZATION N/A 07/08/2011   Procedure: RIGHT HEART CATH;  Surgeon: Dolores Patty, MD;  Location: [facility] CATH LAB;  Service: Cardiovascular;  Laterality: N/A;  . RIGHT HEART CATHETERIZATION N/A 09/09/2011   Procedure: RIGHT HEART CATH;  Surgeon: Dolores Patty, MD;  Location: [facility] CATH LAB;  Service: Cardiovascular;  Laterality: N/A;  . TRACHEOSTOMY  feinstein    Family History: Family History  Problem Relation Age of Onset  . Coronary artery disease Neg Hx     Social History: Social History   Social History  . Marital status: Divorced    Spouse name: N/A  . Number of children: N/A  . Years of education: N/A   Social History Main Topics  . Smoking status: Former Smoker    Packs/day: 0.25    Years: 33.00    Types: Cigarettes    Quit date: 06/29/2011  . Smokeless tobacco: Former Neurosurgeon    Quit date: 06/29/2011  . Alcohol use No  . Drug use: No     Comment: denies  . Sexual activity: Not Currently   Other Topics Concern  . None   Social History Narrative  . None    Allergies:  Allergies  Allergen Reactions  . Ace Inhibitors Cough  . Lexapro [Escitalopram Oxalate] Other (See Comments)    somnolence    Objective:    Vital Signs:   Temp:  [99.1 F (37.3 C)-99.4 F (37.4 C)] 99.1 F (37.3 C) (05/14 0930) Pulse Rate:  [88-89] 89 (05/14 0929) Resp:  [18-21] 21 (05/14 0929) BP: (86)/(73) 86/73 (05/14 0929) SpO2:  [96 %] 96 % (05/14 0929) Weight:  [187 lb (84.8 kg)] 187 lb (84.8 kg) (05/14 0929)   Filed Weights   December 04, 2016 0929  Weight: 187 lb (84.8 kg)    Mean arterial Pressure 78  Physical Exam: General:  Male, NAD.  HEENT: Normal Neck: supple. No JVP . Carotids 2+ bilat; no bruits. No lymphadenopathy or thyromegaly appreciated. Cor: Mechanical heart sounds with LVAD hum present. Lungs: Clear  in all lobes bilaterally.  Abdomen: soft, nontender, nondistended. No hepatosplenomegaly. No bruits or masses. Good bowel sounds. Driveline: C/D/I; No tenderness. Multiple twists in driveline.  Extremities: no cyanosis, clubbing, rash, edema Neuro: alert & orientedx3, cranial nerves grossly intact. moves all 4 extremities w/o difficulty. Affect pleasant  Telemetry: NSR  Labs: Basic Metabolic Panel: No results for input(s): NA, K, CL, CO2, GLUCOSE, BUN, CREATININE, CALCIUM, MG, PHOS in the last 168 hours.  Liver Function Tests: No results for input(s): AST, ALT, ALKPHOS, BILITOT, PROT, ALBUMIN in the last 168 hours. No results for input(s): LIPASE, AMYLASE in the last 168 hours. No results for input(s): AMMONIA in the last 168 hours.  CBC:  Recent Labs Lab 12/04/2016 0928  WBC 5.7  HGB 13.6  HCT 40.8  MCV 90.9  PLT 239    Cardiac Enzymes: No results for input(s): CKTOTAL, CKMB, CKMBINDEX, TROPONINI in the last 168 hours.  BNP: BNP (last 3 results)  Recent Labs  06/14/16 0909  BNP 191.1*    ProBNP (last 3 results) No results for input(s): PROBNP in the last 8760 hours.   CBG: No results for input(s): GLUCAP in the last 168 hours.  Coagulation Studies:  Recent Labs  2016-12-04 0928  LABPROT 29.6*  INR 2.75    Other results: EKG: normal EKG, normal sinus rhythm.  Imaging: Dg Chest Port 1 View  Result Date: 2016/12/04 CLINICAL DATA:  Sternal chest pain which radiates to the back and is associated with headache. Duration of symptoms 1 week. History of CHF, mitral stenosis and regurgitation, hypertension, current smoker. EXAM: PORTABLE CHEST 1 VIEW COMPARISON:  Chest x-ray of April 16, 2016 FINDINGS: A left ventricular assist device is present and appears to be in stable position. The cardiac silhouette is mildly enlarged. The central pulmonary vascularity is prominent and slightly more conspicuous  than on the previous PA chest x-ray. The lungs are reasonably  well inflated. A small amount of pleural thickening versus pleural fluid persists at the left lung base. The sternal wires are intact. The ICD is in stable position. IMPRESSION: Slight central pulmonary vascular prominence accentuated by mild hypoinflation and the AP portable technique. This may reflect low-grade CHF. There is no alveolar pneumonia nor large pleural effusion. The ICD and left ventricular assist devices appear to be in stable position. Electronically Signed   By: David  [place] M.D.   On: 11/15/2016 09:41         Assessment & Plan/Discussion   1. Headache/Right temporal lobe hematoma: patient did have a fall last Tuesday, but radiologist felt that this type of bleed is usually representative of hypertensive hemorrhage.  - STAT head CT obtained and showed right temporal lobe intraparenchymal hematoma with moderate SAH R>L. - Consulted Neurology STAT - Stopped ASA, Plavix and Coumadin.  2. LVAD, HM2:  first in 2013, then pump exchange in 08/2013 with HM2 due to pump thrombosis. Driveline with extensive twisting - not a transplant candidate due to previous noncompliance.  - Give one dose of 80mg  IV Lasix.  - He will not be on anticoagulates due to bleed, will follow LDH and INR.  - His mother and friend manage his medications at home and report compliance.  3. HTN - With intraparenchymal bleeding and SAH, want to keep BP on the lower side.  Continue home meds for now but will defer to neurology.  MAP in 80s currently.  4. PAF: In NSR, no anticoagulation as above.  5. History of RV failure 6. History of embolic CVA in 2015 7. CKD stage II - Follow with daily BMET.   Admit to ICU.   I reviewed the LVAD parameters from today, and compared the results to the patient's prior recorded data.  No programming changes were made.  The LVAD is functioning within specified parameters.  The patient performs LVAD self-test daily.  LVAD interrogation was negative for any significant power  changes, alarms or PI events/speed drops.  LVAD equipment check completed and is in good working order.  Back-up equipment present.   Length of Stay: 0  Little Ishikawa, NP 11/01/2016, 10:12 AM  VAD Team Pager [phone] (7am - 7am) +++VAD ISSUES ONLY+++   Advanced Heart Failure Team Pager [phone] (M-F; 7a - 4p)  Please contact [facility] for night-coverage after hours (4p -7a ) and weekends on [email or web address] for all non- LVAD Issues  Patient seen with NP, agree with the above note.  Mr [name] fell about 1 week ago, says that he tripped and went down. Ever since the fall, he has had a headache.  He finally came into the ER today because the headache would not resolve and was found by CT to have intraparenchymal hemorrhage + SAH (personally reviewed).  He has baseline left-sided weakness from prior CVA but his gait has been a bit more "off" per his family this last week.  No further falls.  He is more short of breath with exertion than baseline and CXR shows some pulmonary edema.  1. Right temporal intraparenchymal hemorrhage with associated SAH:  He has had a headache since a fall 1 week ago (struck head), suspect hemorrhage dates from that time.  The intraparenchymal hemorrhage is not typical of a traumatic CNS hemorrhage, so there is some concern that the hemorrhage occurred first and then he fell, but he does not remember a headache prior  to the fall.   - He has been on ASA 325, Plavix, and warfarin with INR goal 2.5-3 with prior CVA.  Hold all of these.  Neurology to see and will discuss further steps with them.  - Will continue his BP-active meds for now as we will want his BP on the lower side.  Currently MAP around 80.  Will discuss with neurology.  2. Acute on chronic systolic CHF: Mild volume overload on exam with some fluid on CXR.  Will give Lasix 80 mg IV x 1 now .  Admit to CCU.   Marca Ancona 12-Nov-2016 11:59 AM

## 2016-11-04 NOTE — Progress Notes (Signed)
Pt transported to CT on battery power with backup equipment and this RN, no complications. On return to room pt less alert, will occasionally respond to voice but not as consistent as he was earlier in shift.  Neuro MD notified of changes in mental status and that repeat CT has been completed. VS and VAD parameters stable. Mother notified of change in mental status. Awaiting return call from MD

## 2016-11-04 NOTE — ED Notes (Signed)
Pt back from CT.  MD aware pt has bleed on the CT.

## 2016-11-04 NOTE — Procedures (Signed)
Subjective: Patient has become much less responsive than earlier today.  Objective: Current vital signs: BP (!) 89/68   Pulse 93   Temp 99.3 F (37.4 C) (Oral)   Resp (!) 21   Ht 5\' 4"  (1.626 m)   Wt 84.8 kg (187 lb)   SpO2 93%   BMI 32.10 kg/m   Neurologic Exam: Patient was minimally responsive to tactile and verbal stimulation. He was able to move right upper extremity with hand squeezing to command. There was total neglect of left side. Pupils were small and equal and reacted minimally to light. Eyes were tonically deviated to the right side conjugately. Moderate left lower facial weakness was noted. Patient had complete paralysis of left extremities with flaccid tone. There was no abnormal posturing. Deep tendon reflexes were asymmetrical with greater responses elicited from left extremities compared to right extremities. Plantar response on the left was extensor, and on the right, flexor.  Repeat CT scan of the head showed enlargement of right intraparenchymal hematoma involving temporal lobe primarily, with greater mass effect and midline shift than seen on earlier CT scan today. Early signs of uncal herniation also noted.  Medications: Current medications reviewed by me.  Assessment/Plan: Extension of right intracerebral hematoma with greater mass effect noted on CT scan, consistent with patient's clinical deterioration with markedly reduced responsiveness compared to earlier today. Prognosis is poor for any degree of recovery of independent functioning, if he were to survive this acute insult.  Recommendations: Aggressive measures to the extent family's wishes, including plans for intubation for protection of airway and ventilatory support, as well as reversal of anticoagulated state.  We will continue to follow this patient closely with you.  C.R. Roseanne Reno, MD [facility] [phone]  11/13/2016  11:36 PM

## 2016-11-04 NOTE — ED Triage Notes (Signed)
Pt states sternal chest pain that radiates to back and headache.  All symptoms x 1 week.

## 2016-11-04 NOTE — ED Provider Notes (Signed)
[facility]-EMERGENCY DEPT Provider Note   CSN: [id number] Arrival date & time: 10/24/2016  0900     History   Chief Complaint Chief Complaint  Patient presents with  . Chest Pain  . Headache    HPI [name] is a 56 y.o. male.  Patient with hx LVAD, presents with mid chest pain for the past 1 week. Pain constant, dull, moderate, non radiating. Pt unsure of exacerbating or alleviating factors. Denies increased sob. No nv. Denies heartburn. Denies back or flank pain. No fever or chills. No faintness/syncope. Also c/o dull frontal headache for the past several days. Constant, moderate.    The history is provided by the patient.  Chest Pain   Associated symptoms include headaches. Pertinent negatives include no abdominal pain, no back pain, no fever and no shortness of breath.  Headache   Pertinent negatives include no fever and no shortness of breath.    Past Medical History:  Diagnosis Date  . AICD (automatic cardioverter/defibrillator) present   . Bipolar affective disorder (HCC) 10/22/2011   pt denies this hx 02/26/2012  . CHF (congestive heart failure) (HCC)    EF- 10-15  . Chronic renal insufficiency   . COMMON MIGRAINE 06/14/2009   Qualifier: Diagnosis of  By: Jonny Ruiz MD, Len Blalock   . Depression 08/11/2013   Pt denies  . Exertional dyspnea 02/26/2012  . GERD (gastroesophageal reflux disease)   . Hepatomegaly 09/16/2008   Qualifier: Diagnosis of  By: Wonda Amis    . High cholesterol 02/26/2012   "at one time"  . History of blood transfusion 08/2011   "when I had heart pump"  . History of gout 02/26/2012  . HTN (hypertension)   . ICD - IN SITU 09/16/2008   Qualifier: Diagnosis of  By: Wonda Amis    . LV (left ventricular) mural thrombus 01/28/2011  . Medically noncompliant   . Migraines   . Mitral regurgitation   . MITRAL STENOSIS/ INSUFFICIENCY, NON-RHEUMATIC 09/22/2008   Qualifier: Diagnosis of  By: Gala Romney, MD, Trixie Dredge Sleep apnea   .  Substance abuse   . Syncope   . SYSTOLIC HEART FAILURE, CHRONIC 09/22/2008   Qualifier: Diagnosis of  By: Gala Romney, MD, Trixie Dredge Thrombus 08/06/2010  . Tobacco user     Patient Active Problem List   Diagnosis Date Noted  . Acute on chronic systolic heart failure, NYHA class 3 (HCC) 11/08/2014  . Acute on chronic systolic CHF (congestive heart failure), NYHA class 3 (HCC) 11/08/2014  . Volume depletion 07/06/2014  . Left ventricular assist device (LVAD) complication 07/05/2014  . Admitted with dehydration 07/05/2014  . Atrial flutter (HCC) 11/06/2013  . Hemolysis 11/03/2013  . Dysphagia, pharyngoesophageal phase 08/23/2013  . Elevated LDH 08/23/2013  . Tracheostomy status (HCC) 08/16/2013  . CVA (cerebral infarction) 07/19/2013  . Acute respiratory failure (HCC) 07/19/2013  . Altered mental status 07/19/2013  . NSTEMI, initial episode of care (HCC) 07/17/2013  . Supratherapeutic INR 08/18/2012  . Acute hemolysis 07/28/2012  . Hypertension 03/21/2012  . Cough 03/03/2012  . Nausea and vomiting 02/26/2012  . SVT (supraventricular tachycardia) (HCC) 11/27/2011  . LVAD (left ventricular assist device) present (HCC) 11/27/2011  . Ventricular tachycardia (HCC) 11/22/2011  . Bipolar affective disorder (HCC) 10/22/2011  . Migraine 10/22/2011  . Preventative health care 10/21/2011  . Gastritis, acute 09/04/2011  . Hypotension 08/10/2011  . Cardiogenic shock (HCC) 07/10/2011  . Acute on chronic systolic heart failure (HCC) 07/07/2011  .  LV (left ventricular) mural thrombus 01/28/2011  . Thrombus 08/06/2010  . TOBACCO ABUSE 07/03/2010  . BACK PAIN 03/10/2009  . Mitral valve disorder 09/22/2008  . Congestive heart failure (HCC) 09/22/2008  . SYSTOLIC HEART FAILURE, CHRONIC 09/22/2008  . Other malaise and fatigue 09/16/2008  . ABNORMAL HEART SOUNDS 09/16/2008  . DYSPNEA 09/16/2008  . Hepatomegaly 09/16/2008  . ABNORMAL ECHOCARDIOGRAM 09/16/2008  . Automatic implantable  cardioverter-defibrillator in situ 09/16/2008    Past Surgical History:  Procedure Laterality Date  . CARDIAC DEFIBRILLATOR PLACEMENT  ~ 2008  . INTRA-AORTIC BALLOON PUMP INSERTION  09/09/2011   Procedure: INTRA-AORTIC BALLOON PUMP INSERTION;  Surgeon: Dolores Patty, MD;  Location: [facility] CATH LAB;  Service: Cardiovascular;;  . LEFT AND RIGHT HEART CATHETERIZATION WITH CORONARY ANGIOGRAM N/A 07/19/2013   Procedure: LEFT AND RIGHT HEART CATHETERIZATION WITH CORONARY ANGIOGRAM;  Surgeon: Dolores Patty, MD;  Location: [facility] CATH LAB;  Service: Cardiovascular;  Laterality: N/A;  . LEFT VENTRICULAR ASSIST DEVICE  08/2011  . RADIOLOGY WITH ANESTHESIA N/A 07/19/2013   Procedure: RADIOLOGY WITH ANESTHESIA;  Surgeon: Oneal Grout, MD;  Location: [facility] OR;  Service: Radiology;  Laterality: N/A;  . RIGHT HEART CATHETERIZATION N/A 07/08/2011   Procedure: RIGHT HEART CATH;  Surgeon: Dolores Patty, MD;  Location[facility] CATH LAB;  Service: Cardiovascular;  Laterality: N/A;  . RIGHT HEART CATHETERIZATION N/A 09/09/2011   Procedure: RIGHT HEART CATH;  Surgeon: Dolores Patty, MD;  Location: [facility] CATH LAB;  Service: Cardiovascular;  Laterality: N/A;  . TRACHEOSTOMY     feinstein       Home Medications    Prior to Admission medications   Medication Sig Start Date End Date Taking? Authorizing Provider  aspirin 81 MG chewable tablet Chew 1 tablet (81 mg total) by mouth daily. 02/13/16   Bensimhon, Bevelyn Buckles, MD  atorvastatin (LIPITOR) 40 MG tablet TAKE 1 TABLET BY MOUTH EVERY DAY 07/15/16   Laurey Morale, MD  carvedilol (COREG) 3.125 MG tablet TAKE 1 TABLET BY MOUTH TWICE A DAY WITH A MEAL 06/05/16   Laurey Morale, MD  clopidogrel (PLAVIX) 75 MG tablet Take 1 tablet (75 mg total) by mouth daily with breakfast. 06/13/15   Laurey Morale, MD  clopidogrel (PLAVIX) 75 MG tablet TAKE 1 TABLET (75 MG TOTAL) BY MOUTH DAILY WITH BREAKFAST. 10/16/16   Bensimhon, Bevelyn Buckles, MD  hydrALAZINE (APRESOLINE) 50  MG tablet Take 1 tablet (50 mg total) by mouth 3 (three) times daily. 12/25/15   Bensimhon, Bevelyn Buckles, MD  losartan (COZAAR) 100 MG tablet TAKE 1 TABLET (100 MG TOTAL) BY MOUTH DAILY. 07/15/16   Laurey Morale, MD  metolazone (ZAROXOLYN) 2.5 MG tablet Take 1 tablet (2.5 mg total) by mouth as needed (When MD tells you to take it). Patient not taking: Reported on 10/14/2016 05/09/15   Bensimhon, Bevelyn Buckles, MD  potassium chloride SA (KLOR-CON M20) 20 MEQ tablet Take 1 tablet (20 mEq total) by mouth 2 (two) times daily. 10/09/16   Bensimhon, Bevelyn Buckles, MD  tamsulosin (FLOMAX) 0.4 MG CAPS capsule TAKE 1 CAPSULE (0.4 MG TOTAL) BY MOUTH DAILY. 07/15/16   Laurey Morale, MD  torsemide (DEMADEX) 20 MG tablet Take 20 mg by mouth daily.    [provider]  warfarin (COUMADIN) 4 MG tablet Take 4 mg by mouth daily.    [provider]  zolpidem (AMBIEN) 10 MG tablet Take 1 tablet (10 mg total) by mouth at bedtime as needed for sleep. 10/14/16  Bensimhon, Bevelyn Buckles, MD    Family History Family History  Problem Relation Age of Onset  . Coronary artery disease Neg Hx     Social History Social History  Substance Use Topics  . Smoking status: Former Smoker    Packs/day: 0.25    Years: 33.00    Types: Cigarettes    Quit date: 06/29/2011  . Smokeless tobacco: Former Neurosurgeon    Quit date: 06/29/2011  . Alcohol use No     Allergies   Ace inhibitors and Lexapro [escitalopram oxalate]   Review of Systems Review of Systems  Constitutional: Negative for fever.  HENT: Negative for sore throat.   Eyes: Negative for visual disturbance.  Respiratory: Negative for shortness of breath.   Cardiovascular: Positive for chest pain.  Gastrointestinal: Negative for abdominal pain.  Genitourinary: Negative for flank pain.  Musculoskeletal: Negative for back pain and neck pain.  Skin: Negative for rash.  Neurological: Positive for headaches.  Hematological: Does not bruise/bleed easily.    Psychiatric/Behavioral: Negative for confusion.     Physical Exam Updated Vital Signs BP (!) 86/73 (BP Location: Right Arm)   Pulse 89   Temp 99.1 F (37.3 C) (Oral)   Resp (!) 21   Ht 5\' 4"  (1.626 m)   Wt 84.8 kg   SpO2 96%   BMI 32.10 kg/m   Physical Exam  Constitutional: He appears well-developed and well-nourished. No distress.  HENT:  Mouth/Throat: Oropharynx is clear and moist.  Eyes: Conjunctivae are normal.  Neck: Neck supple. No tracheal deviation present.  Cardiovascular:  Hum of LVAD present.   Pulmonary/Chest: Effort normal and breath sounds normal. No accessory muscle usage. No respiratory distress.  Abdominal: He exhibits no distension. There is no tenderness.  Musculoskeletal: He exhibits no edema.  Neurological: He is alert.  Skin: Skin is warm and dry. He is not diaphoretic.  Psychiatric: He has a normal mood and affect.  Nursing note and vitals reviewed.    ED Treatments / Results  Labs (all labs ordered are listed, but only abnormal results are displayed) Labs Reviewed  CBC  COMPREHENSIVE METABOLIC PANEL  PROTIME-INR  LACTATE DEHYDROGENASE  I-STAT TROPOININ, ED    EKG  EKG Interpretation  Date/Time:  Monday Nov 04 2016 09:23:51 EDT Ventricular Rate:  90 PR Interval:    QRS Duration: 126 QT Interval:  425 QTC Calculation: 521 R Axis:   -160 Text Interpretation:  Sinus tachycardia Low voltage QRS Right bundle branch block Confirmed by Denton Lank  MD, Caryn Bee ([id number]) on 11/06/2016 9:36:03 AM       Radiology Ct Head Wo Contrast  Result Date: 10/28/2016 CLINICAL DATA:  Fall 1 week ago.  Headache.  On anticoagulation. EXAM: CT HEAD WITHOUT CONTRAST TECHNIQUE: Contiguous axial images were obtained from the base of the skull through the vertex without intravenous contrast. COMPARISON:  CT head 07/20/2013 FINDINGS: Brain: Right temporal lobe hemorrhage measures 3.8 x 2.6 x 3.0 cm. Blood volume 15 mL. Surrounding low-density edema. There is  extension into the subarachnoid space with moderate amount of subarachnoid blood on the right and a mild amount of subarachnoid blood on the left. 5 mm midline shift to the left. Negative for hydrocephalus Chronic infarct left basal ganglia. Moderately large chronic lacunar infarct in the right pons. This was not present in 2015. No evidence of mass lesion. Vascular: Negative for hyperdense vessel Skull: Negative for skull fracture. Sinuses/Orbits: Negative Other: None IMPRESSION: Right temporal lobe hematoma. This could represent hypertensive hemorrhage in the external  capsule or temporal lobe. This is not a typical appearance for posttraumatic hemorrhage. There is moderate amount of subarachnoid hemorrhage, right greater than left which is most likely hemorrhage extension from the right temporal lobe hematoma. Mild midline shift to the left. Chronic infarcts in the left basal ganglia and right pons. These results were reviewed in person at the time of interpretation on 02-Dec-2016 at 10:28 am to Dr. Shirlee Latch, who verbally acknowledged these results. Electronically Signed   By: Marlan Palau M.D.   On: Dec 02, 2016 10:30   Dg Chest Port 1 View  Result Date: December 02, 2016 CLINICAL DATA:  Sternal chest pain which radiates to the back and is associated with headache. Duration of symptoms 1 week. History of CHF, mitral stenosis and regurgitation, hypertension, current smoker. EXAM: PORTABLE CHEST 1 VIEW COMPARISON:  Chest x-ray of April 16, 2016 FINDINGS: A left ventricular assist device is present and appears to be in stable position. The cardiac silhouette is mildly enlarged. The central pulmonary vascularity is prominent and slightly more conspicuous than on the previous PA chest x-ray. The lungs are reasonably well inflated. A small amount of pleural thickening versus pleural fluid persists at the left lung base. The sternal wires are intact. The ICD is in stable position. IMPRESSION: Slight central pulmonary  vascular prominence accentuated by mild hypoinflation and the AP portable technique. This may reflect low-grade CHF. There is no alveolar pneumonia nor large pleural effusion. The ICD and left ventricular assist devices appear to be in stable position. Electronically Signed   By: David  [place] M.D.   On: 12-02-2016 09:41    Procedures Procedures (including critical care time)  Medications Ordered in ED Medications  0.9 %  sodium chloride infusion (not administered)     Initial Impression / Assessment and Plan / ED Course  I have reviewed the triage vital signs and the nursing notes.  Pertinent labs & imaging results that were available during my care of the patient were reviewed by me and considered in my medical decision making (see chart for details).  Monitor. Pulse ox.  Ecg.   LVAD/cardiology team consulted.  CT head pending.     Final Clinical Impressions(s) / ED Diagnoses   Final diagnoses:  None    New Prescriptions New Prescriptions   No medications on file     Cathren Laine, MD 2016/12/02 1627

## 2016-11-04 NOTE — Progress Notes (Signed)
On assessment no backup controller at bedside, per report pt came in with no backup equipment. VAD coordinator paged and made aware, indicates there is an extra backup controller on the VAD cart and should the controller need to be changed out there are more spare controllers available in her office.

## 2016-11-04 NOTE — Consult Note (Signed)
Name: [name] MRN: [id number] DOB: [birth year]    ADMISSION DATE:  20-Nov-2016 CONSULTATION DATE:  Nov 20, 2016  REFERRING MD :  Gala Romney  CHIEF COMPLAINT:  AMS   HISTORY OF PRESENT ILLNESS:  Ramiz Spannagel is a 56 y.o. male with a PMH as outlined below including but not limited to severe CHF and NICM s/p LVAD and TVR (April 2013, VT, NSTEMI, LV thrombus.  He presented to [facility] ED with cough and back pain as well as headache after suffering a fall at home on 10/29/16 (dropped a remote control and when he leaned over to pick it up, he fell over and struck his head).  He had CT of the head which demonstrated right sided ICH in temporal region.  He was admitted for further management.  Later that evening, he had change in mental status.  Repeat CT demonstrated extension of the right ICH with greater mass effect and MLS along with early signs of uncal herniation.   Per neurology, prognosis is extremely poor for any degree of recover.  PCCM was called for consideration of intubation while awaiting family to arrive.  Dr. Kendrick Fries reviewed case with Dr. Roseanne Reno and Dr. Gala Romney and decision was made to not put Mr. Lackland through more aggressive measures that would not change his outcome.  Dr. Kendrick Fries then discussed case with pt's mother, Renato Gails who agreed to this and also agreed to DNR status.  PAST MEDICAL HISTORY :   has a past medical history of AICD (automatic cardioverter/defibrillator) present; Bipolar affective disorder (HCC) (10/22/2011); CHF (congestive heart failure) (HCC); Chronic renal insufficiency; COMMON MIGRAINE (06/14/2009); Depression (08/11/2013); Exertional dyspnea (02/26/2012); GERD (gastroesophageal reflux disease); Hepatomegaly (09/16/2008); High cholesterol (02/26/2012); History of blood transfusion (08/2011); History of gout (02/26/2012); HTN (hypertension); ICD - IN SITU (09/16/2008); LV (left ventricular) mural thrombus (01/28/2011); Medically noncompliant; Migraines; Mitral  regurgitation; MITRAL STENOSIS/ INSUFFICIENCY, NON-RHEUMATIC (09/22/2008); Sleep apnea; Substance abuse; Syncope; SYSTOLIC HEART FAILURE, CHRONIC (09/22/2008); Thrombus (08/06/2010); and Tobacco user.  has a past surgical history that includes Cardiac defibrillator placement (~ 2008); Left ventricular assist device (08/2011); Radiology with anesthesia (N/A, 07/19/2013); Tracheostomy; right heart catheterization (N/A, 07/08/2011); right heart catheterization (N/A, 09/09/2011); intra-aortic balloon pump insertion (09/09/2011); and left and right heart catheterization with coronary angiogram (N/A, 07/19/2013). Prior to Admission medications   Medication Sig Start Date End Date Taking? Authorizing Provider  acetaminophen (TYLENOL) 325 MG tablet Take 650 mg by mouth every 6 (six) hours as needed for mild pain.   Yes [provider]  aspirin 81 MG chewable tablet Chew 1 tablet (81 mg total) by mouth daily. 02/13/16  Yes Bensimhon, Bevelyn Buckles, MD  atorvastatin (LIPITOR) 40 MG tablet TAKE 1 TABLET BY MOUTH EVERY DAY 07/15/16  Yes Laurey Morale, MD  carvedilol (COREG) 3.125 MG tablet TAKE 1 TABLET BY MOUTH TWICE A DAY WITH A MEAL 06/05/16  Yes Laurey Morale, MD  Chlorphen-Phenyleph-APAP (CORICIDIN D COLD/FLU/SINUS) 2-5-325 MG TABS Take 1 tablet by mouth daily as needed (cough).   Yes [provider]  clopidogrel (PLAVIX) 75 MG tablet TAKE 1 TABLET (75 MG TOTAL) BY MOUTH DAILY WITH BREAKFAST. 10/16/16  Yes Bensimhon, Bevelyn Buckles, MD  hydrALAZINE (APRESOLINE) 50 MG tablet Take 1 tablet (50 mg total) by mouth 3 (three) times daily. 12/25/15  Yes Bensimhon, Bevelyn Buckles, MD  losartan (COZAAR) 100 MG tablet TAKE 1 TABLET (100 MG TOTAL) BY MOUTH DAILY. 07/15/16  Yes Laurey Morale, MD  potassium chloride SA (KLOR-CON M20) 20 MEQ tablet Take 1 tablet (  20 mEq total) by mouth 2 (two) times daily. 10/09/16  Yes Bensimhon, Bevelyn Buckles, MD  tamsulosin (FLOMAX) 0.4 MG CAPS capsule TAKE 1 CAPSULE (0.4 MG TOTAL) BY MOUTH DAILY.  07/15/16  Yes Laurey Morale, MD  torsemide (DEMADEX) 20 MG tablet Take 20 mg by mouth daily.   Yes [provider]  warfarin (COUMADIN) 4 MG tablet Take 4 mg by mouth daily.   Yes [provider]  zolpidem (AMBIEN) 10 MG tablet Take 1 tablet (10 mg total) by mouth at bedtime as needed for sleep. 10/14/16  Yes Bensimhon, Bevelyn Buckles, MD   Allergies  Allergen Reactions  . Ace Inhibitors Cough  . Lexapro [Escitalopram Oxalate] Other (See Comments)    somnolence    FAMILY HISTORY:  family history is not on file. SOCIAL HISTORY:  reports that he quit smoking about 5 years ago. His smoking use included Cigarettes. He has a 8.25 pack-year smoking history. He quit smokeless tobacco use about 5 years ago. He reports that he does not drink alcohol or use drugs.  REVIEW OF SYSTEMS:   Unable to obtain as pt is encephalopathic.   SUBJECTIVE: Comatose.  VITAL SIGNS: Temp:  [98.9 F (37.2 C)-99.4 F (37.4 C)] 99.3 F (37.4 C) (05/14 2000) Pulse Rate:  [62-97] 93 (05/14 2300) Resp:  [8-24] 21 (05/14 2300) BP: (82-130)/(68-92) 89/68 (05/14 1145) SpO2:  [92 %-97 %] 93 % (05/14 2300) Weight:  [84.8 kg (187 lb)] 84.8 kg (187 lb) (05/14 1227)  PHYSICAL EXAMINATION: General: Adult male, comatose, critically ill. Neuro: Comatose.  Flaccid on left, flicker of toe on right.  No movement in UE's.  + corneal on right, - on left. HEENT: [place]/AT. PERRL, sclerae anicteric. Cardiovascular: RRR, no M/R/G.  Lungs: Respirations even and unlabored.  CTA bilaterally, No W/R/R.  Abdomen: BS x 4, soft, NT/ND.  Musculoskeletal: No gross deformities, no edema.  Skin: Intact, warm, no rashes.     Recent Labs Lab 10/30/2016 0928  NA 138  K 4.2  CL 106  CO2 22  BUN 14  CREATININE 1.41*  GLUCOSE 106*    Recent Labs Lab 10/28/2016 0928  HGB 13.6  HCT 40.8  WBC 5.7  PLT 239   Ct Head Wo Contrast  Result Date: 10/31/2016 CLINICAL DATA:  Fall 1 week ago.  Headache.  On anticoagulation.  EXAM: CT HEAD WITHOUT CONTRAST TECHNIQUE: Contiguous axial images were obtained from the base of the skull through the vertex without intravenous contrast. COMPARISON:  CT head 07/20/2013 FINDINGS: Brain: Right temporal lobe hemorrhage measures 3.8 x 2.6 x 3.0 cm. Blood volume 15 mL. Surrounding low-density edema. There is extension into the subarachnoid space with moderate amount of subarachnoid blood on the right and a mild amount of subarachnoid blood on the left. 5 mm midline shift to the left. Negative for hydrocephalus Chronic infarct left basal ganglia. Moderately large chronic lacunar infarct in the right pons. This was not present in 2015. No evidence of mass lesion. Vascular: Negative for hyperdense vessel Skull: Negative for skull fracture. Sinuses/Orbits: Negative Other: None IMPRESSION: Right temporal lobe hematoma. This could represent hypertensive hemorrhage in the external capsule or temporal lobe. This is not a typical appearance for posttraumatic hemorrhage. There is moderate amount of subarachnoid hemorrhage, right greater than left which is most likely hemorrhage extension from the right temporal lobe hematoma. Mild midline shift to the left. Chronic infarcts in the left basal ganglia and right pons. These results were reviewed in person at the time of  interpretation on 2016/11/06 at 10:28 am to Dr. Shirlee Latch, who verbally acknowledged these results. Electronically Signed   By: Marlan Palau M.D.   On: 2016-11-06 10:30   Dg Chest Port 1 View  Result Date: 11/06/16 CLINICAL DATA:  Sternal chest pain which radiates to the back and is associated with headache. Duration of symptoms 1 week. History of CHF, mitral stenosis and regurgitation, hypertension, current smoker. EXAM: PORTABLE CHEST 1 VIEW COMPARISON:  Chest x-ray of April 16, 2016 FINDINGS: A left ventricular assist device is present and appears to be in stable position. The cardiac silhouette is mildly enlarged. The central pulmonary  vascularity is prominent and slightly more conspicuous than on the previous PA chest x-ray. The lungs are reasonably well inflated. A small amount of pleural thickening versus pleural fluid persists at the left lung base. The sternal wires are intact. The ICD is in stable position. IMPRESSION: Slight central pulmonary vascular prominence accentuated by mild hypoinflation and the AP portable technique. This may reflect low-grade CHF. There is no alveolar pneumonia nor large pleural effusion. The ICD and left ventricular assist devices appear to be in stable position. Electronically Signed   By: David  Swaziland M.D.   On: 11/06/16 09:41    STUDIES:  CT head 5/14 > extension of the right ICH with greater mass effect and MLS along with early signs of uncal herniation.   SIGNIFICANT EVENTS  5/14 > admit. 5/15 > PCCM consult after change in mental status.  ASSESSMENT / PLAN:  Large right ICH - with extension from previous CT scan and now with greater mass effect and MLS along with early signs of uncal herniation.  Per neurology, this is non-surviveable. Plan: DNR status confirmed with pt's mother. Dr. Kendrick Fries has suggested that pt's mother and family come in to hospital tonight. Emotional support offered.   Rutherford Guys, [place] [facility] Pager: [phone]  or [phone] 11/10/2016, 12:09 AM

## 2016-11-04 NOTE — ED Notes (Signed)
Called cardiac RN.  She will come shortly.

## 2016-11-04 NOTE — Progress Notes (Signed)
LVAD Coordinator Rounding Note:  Admitted 15-Nov-2016 due to right temporal intraparenchymal hemorrhage with associated SAH possibly due to recent fall.   HeartMate II LVAD implated on 08/31/2013 by [facility].   Vital signs: HR: 85 Doppler Pressure:80 Automatic BP: 89/68 (77) O2 Sat: 97% on RA Wt:187     LVAD interrogation reveals:  Speed:8600 Flow: 3.8 Power:  4.2 PI:5.2  Alarms: none Events:  10-15 PI events daily Fixed speed: 8600 Low speed limit: 8200  Drive Line: Unremarkable. Changed weekly by family members.  Events on VAD Support: 09/16/11> HM 2 Implant 06/2013> CVA with left side deficits 08/31/2013> HM 2 exchange for suspected pump thrombosis at [facility]  Labs:  LDH trend:385>  INR trend: 2.75>  Anticoagulation Plan: -INR Goal: 2.5-3 -ASA Dose: on hold. Was previously on 81 mg.    Plan/Recommendations:   1. Monitor closely and continue supportive care.  Marcellus Scott RN, VAD Coordinator 24/7 pager [phone]

## 2016-11-04 NOTE — ED Notes (Signed)
Cardiology at bedside.

## 2016-11-05 DIAGNOSIS — I052 Rheumatic mitral stenosis with insufficiency: Secondary | ICD-10-CM | POA: Diagnosis present

## 2016-11-05 DIAGNOSIS — Z95811 Presence of heart assist device: Secondary | ICD-10-CM

## 2016-11-05 DIAGNOSIS — I255 Ischemic cardiomyopathy: Secondary | ICD-10-CM | POA: Diagnosis present

## 2016-11-05 DIAGNOSIS — I252 Old myocardial infarction: Secondary | ICD-10-CM | POA: Diagnosis not present

## 2016-11-05 DIAGNOSIS — Z7901 Long term (current) use of anticoagulants: Secondary | ICD-10-CM | POA: Diagnosis not present

## 2016-11-05 DIAGNOSIS — Z7902 Long term (current) use of antithrombotics/antiplatelets: Secondary | ICD-10-CM | POA: Diagnosis not present

## 2016-11-05 DIAGNOSIS — S066X0A Traumatic subarachnoid hemorrhage without loss of consciousness, initial encounter: Secondary | ICD-10-CM | POA: Diagnosis present

## 2016-11-05 DIAGNOSIS — I69354 Hemiplegia and hemiparesis following cerebral infarction affecting left non-dominant side: Secondary | ICD-10-CM | POA: Diagnosis not present

## 2016-11-05 DIAGNOSIS — I629 Nontraumatic intracranial hemorrhage, unspecified: Secondary | ICD-10-CM | POA: Diagnosis not present

## 2016-11-05 DIAGNOSIS — I130 Hypertensive heart and chronic kidney disease with heart failure and stage 1 through stage 4 chronic kidney disease, or unspecified chronic kidney disease: Secondary | ICD-10-CM | POA: Diagnosis present

## 2016-11-05 DIAGNOSIS — Z95810 Presence of automatic (implantable) cardiac defibrillator: Secondary | ICD-10-CM | POA: Diagnosis not present

## 2016-11-05 DIAGNOSIS — Z888 Allergy status to other drugs, medicaments and biological substances status: Secondary | ICD-10-CM | POA: Diagnosis not present

## 2016-11-05 DIAGNOSIS — I5022 Chronic systolic (congestive) heart failure: Secondary | ICD-10-CM | POA: Diagnosis not present

## 2016-11-05 DIAGNOSIS — R51 Headache: Secondary | ICD-10-CM | POA: Diagnosis present

## 2016-11-05 DIAGNOSIS — N182 Chronic kidney disease, stage 2 (mild): Secondary | ICD-10-CM | POA: Diagnosis present

## 2016-11-05 DIAGNOSIS — E7800 Pure hypercholesterolemia, unspecified: Secondary | ICD-10-CM | POA: Diagnosis present

## 2016-11-05 DIAGNOSIS — Z87891 Personal history of nicotine dependence: Secondary | ICD-10-CM | POA: Diagnosis not present

## 2016-11-05 DIAGNOSIS — G935 Compression of brain: Secondary | ICD-10-CM | POA: Diagnosis present

## 2016-11-05 DIAGNOSIS — Z7982 Long term (current) use of aspirin: Secondary | ICD-10-CM | POA: Diagnosis not present

## 2016-11-05 DIAGNOSIS — F319 Bipolar disorder, unspecified: Secondary | ICD-10-CM | POA: Diagnosis present

## 2016-11-05 DIAGNOSIS — W01198A Fall on same level from slipping, tripping and stumbling with subsequent striking against other object, initial encounter: Secondary | ICD-10-CM | POA: Diagnosis present

## 2016-11-05 DIAGNOSIS — G9340 Encephalopathy, unspecified: Secondary | ICD-10-CM | POA: Diagnosis not present

## 2016-11-05 DIAGNOSIS — Z515 Encounter for palliative care: Secondary | ICD-10-CM | POA: Diagnosis not present

## 2016-11-05 DIAGNOSIS — K219 Gastro-esophageal reflux disease without esophagitis: Secondary | ICD-10-CM | POA: Diagnosis present

## 2016-11-05 DIAGNOSIS — Z66 Do not resuscitate: Secondary | ICD-10-CM | POA: Diagnosis present

## 2016-11-05 DIAGNOSIS — I5023 Acute on chronic systolic (congestive) heart failure: Secondary | ICD-10-CM | POA: Diagnosis not present

## 2016-11-05 LAB — CBC WITH DIFFERENTIAL/PLATELET
Basophils Absolute: 0 10*3/uL (ref 0.0–0.1)
Basophils Relative: 0 %
EOS ABS: 0 10*3/uL (ref 0.0–0.7)
Eosinophils Relative: 0 %
HCT: 40.1 % (ref 39.0–52.0)
Hemoglobin: 13.2 g/dL (ref 13.0–17.0)
LYMPHS ABS: 0.9 10*3/uL (ref 0.7–4.0)
LYMPHS PCT: 10 %
MCH: 29.9 pg (ref 26.0–34.0)
MCHC: 32.9 g/dL (ref 30.0–36.0)
MCV: 90.7 fL (ref 78.0–100.0)
MONOS PCT: 5 %
Monocytes Absolute: 0.4 10*3/uL (ref 0.1–1.0)
NEUTROS PCT: 85 %
Neutro Abs: 7.7 10*3/uL (ref 1.7–7.7)
Platelets: 288 10*3/uL (ref 150–400)
RBC: 4.42 MIL/uL (ref 4.22–5.81)
RDW: 14 % (ref 11.5–15.5)
WBC: 9 10*3/uL (ref 4.0–10.5)

## 2016-11-05 LAB — URINALYSIS, ROUTINE W REFLEX MICROSCOPIC
BILIRUBIN URINE: NEGATIVE
GLUCOSE, UA: 150 mg/dL — AB
KETONES UR: 20 mg/dL — AB
LEUKOCYTES UA: NEGATIVE
NITRITE: NEGATIVE
PH: 5 (ref 5.0–8.0)
Protein, ur: 100 mg/dL — AB
SPECIFIC GRAVITY, URINE: 1.024 (ref 1.005–1.030)

## 2016-11-05 LAB — BASIC METABOLIC PANEL
Anion gap: 14 (ref 5–15)
BUN: 14 mg/dL (ref 6–20)
CHLORIDE: 104 mmol/L (ref 101–111)
CO2: 20 mmol/L — AB (ref 22–32)
CREATININE: 1.35 mg/dL — AB (ref 0.61–1.24)
Calcium: 9.8 mg/dL (ref 8.9–10.3)
GFR calc non Af Amer: 58 mL/min — ABNORMAL LOW (ref >60)
Glucose, Bld: 141 mg/dL — ABNORMAL HIGH (ref 65–99)
POTASSIUM: 5.1 mmol/L (ref 3.5–5.1)
Sodium: 138 mmol/L (ref 135–145)

## 2016-11-05 LAB — LACTATE DEHYDROGENASE: LDH: 345 U/L — ABNORMAL HIGH (ref 98–192)

## 2016-11-05 LAB — HIV ANTIBODY (ROUTINE TESTING W REFLEX): HIV Screen 4th Generation wRfx: NONREACTIVE

## 2016-11-05 LAB — PROTIME-INR
INR: 1.16
Prothrombin Time: 14.8 seconds (ref 11.4–15.2)

## 2016-11-05 MED ORDER — MIDAZOLAM HCL 2 MG/2ML IJ SOLN
INTRAMUSCULAR | Status: AC
  Filled 2016-11-05: qty 2

## 2016-11-05 MED ORDER — SODIUM CHLORIDE 0.9 % IV SOLN
5.0000 mg/h | INTRAVENOUS | Status: DC
  Administered 2016-11-05: 5 mg/h via INTRAVENOUS
  Filled 2016-11-05: qty 10

## 2016-11-05 MED ORDER — MIDAZOLAM HCL 5 MG/ML IJ SOLN
2.0000 mg/h | INTRAMUSCULAR | Status: DC
Start: 2016-11-05 — End: 2016-11-06
  Administered 2016-11-05: 2 mg/h via INTRAVENOUS
  Filled 2016-11-05: qty 10

## 2016-11-05 MED ORDER — GLYCOPYRROLATE 0.2 MG/ML IJ SOLN
0.1000 mg | Freq: Four times a day (QID) | INTRAMUSCULAR | Status: DC | PRN
  Administered 2016-11-05: 0.1 mg via INTRAVENOUS
  Filled 2016-11-05: qty 1

## 2016-11-05 MED ORDER — MIDAZOLAM HCL 2 MG/2ML IJ SOLN
2.0000 mg | Freq: Once | INTRAMUSCULAR | Status: AC
  Administered 2016-11-05: 2 mg via INTRAVENOUS

## 2016-11-06 LAB — BPAM FFP
BLOOD PRODUCT EXPIRATION DATE: 201805202359
BLOOD PRODUCT EXPIRATION DATE: 201805202359
Blood Product Expiration Date: 201805202359
Blood Product Expiration Date: 201805202359
ISSUE DATE / TIME: 201805150203
ISSUE DATE / TIME: 201805150257
ISSUE DATE / TIME: 201805151609
UNIT TYPE AND RH: 5100
UNIT TYPE AND RH: 5100
Unit Type and Rh: 5100
Unit Type and Rh: 5100

## 2016-11-06 LAB — PREPARE FRESH FROZEN PLASMA
UNIT DIVISION: 0
Unit division: 0
Unit division: 0
Unit division: 0

## 2016-11-06 LAB — ABO/RH: ABO/RH(D): O POS

## 2016-11-13 ENCOUNTER — Other Ambulatory Visit (HOSPITAL_COMMUNITY): Payer: Medicare Other

## 2016-11-22 NOTE — Progress Notes (Signed)
   11/10/2016 1045  Clinical Encounter Type  Visited With Patient and family together  Visit Type Critical Care  Spiritual Encounters  Spiritual Needs Prayer  Stress Factors  Patient Stress Factors Health changes  Family Stress Factors Family relationships  Paged by nursing of comfort and prayer for family. Offered prayer of comfort and peace.

## 2016-11-22 NOTE — Discharge Summary (Signed)
    Advanced Heart Failure Death Summary  Death Summary   Patient ID: [name] MRN: [id number], DOB/AGE: [birth year] 54 y.o. Admit date: 2016-12-03 D/C date:     11/04/2016   Primary Discharge Diagnoses:  1. Intracerebral hemorrhage with profound neurologic injury 2. A/C Systolic HF   Hospital Course:  Mr. 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  He presented to the ED on 12/03/16 with cough, back pain, and headache. He had fallen on at home on 10/29/16, he hit his head on the wall and electrical socket. Priro to admit he was on aspirin, plavix and coumadin. He was complaining of headache with some visual disturbance. He was sent for stat CT of head that showed R temporal intraparenchymal hemorrhage with associated SAH. All anticoagulants were stopped and Neurology was consulted. He was not given reversal agent as it was thought to related to hypertension. Repeat CT last night showed extension of intracranial hemorrhage. Neurology reviewed with Dr Gala Romney and agreed this was a devastating bleeding that was not survivable. Dr Gala Romney discussed at length with his Mom and he transitioned to comfort care. His VAD and pacemaker were deactivated. He passed quietly with Dr Gala Romney and his family at the bedside at 12:00pm.    Signed, Tonye Becket, NP 11/14/2016, 12:08 PM  Agree with above.   Arvilla Meres, MD  10:08 PM

## 2016-11-22 NOTE — Progress Notes (Signed)
Responded to nurse request to continue support to patient and family. Patient is actively dying. Patient Mother and other family members are at bedside. Prayed with patient and mother and offered other chaplain support.   Provided empathetic listening grief support and presence.  Chaplain available as needed.   03-Dec-2016 1150  Clinical Encounter Type  Visited With Patient and family together;Health care provider  Visit Type Initial;Spiritual support;Patient actively dying  Referral From Nurse  Spiritual Encounters  Spiritual Needs Emotional;Grief support  Stress Factors  Patient Stress Factors Health changes  Family Stress Factors Exhausted;Family relationships;Health changes  Venida Jarvis, [place] [phone]

## 2016-11-22 NOTE — Progress Notes (Signed)
Al Decant, Medical Examiner, contacted and states he will be seeing the patient. Will transfer patient to morgue.

## 2016-11-22 NOTE — Progress Notes (Signed)
   11-15-16 1215  Clinical Encounter Type  Visited With Patient and family together  Visit Type Death  Spiritual Encounters  Spiritual Needs Prayer;Grief support  Stress Factors  Patient Stress Factors Major life changes  Family Stress Factors Family relationships  Paged about Pt's death. Offered memorial prayer with staff and family.

## 2016-11-22 NOTE — Progress Notes (Signed)
   ADVANCED HF TEAM  Patient with continued deterioration throughout the day.   After several discussions with his family, regarding degree of neurologic devastation the decision was made to deactivate his VAD and pursue comfort care.   In conjunction with our VAD coordinator, Hessie Diener, I personally deactivated his VAD and also turned off his pacemaker.  Patient passed within 15 minutes of deactivating his devices the patient passed comfortably with the VAD team and his family at the bedside.   I auscultated his chest and pronounced him at 12:00pm.   Additional 45 mins CCT.  Arvilla Meres, MD  10:06 PM

## 2016-11-22 NOTE — Progress Notes (Signed)
Eyes prepped for possible donation. PIV x 2 removed- left hand and right wrist.

## 2016-11-22 NOTE — Progress Notes (Signed)
HeartMate 2 Rounding Note  Subjective:    Admitted with headache. CT showed right temporal intracerebral hematoma. All anticoagulants stopped. Repeat CT last night with extension that is not survivalable.   Unresponsive.    LVAD INTERROGATION:  HeartMate II LVAD:  Flow 3.8 liters/min, speed 8600, power4, PI 5.6    Objective:    Vital Signs:   Temp:  [97.5 F (36.4 C)-99.4 F (37.4 C)] 97.7 F (36.5 C) (05/15 0620) Pulse Rate:  [60-105] 67 (05/15 0700) Resp:  [8-29] 21 (05/15 0700) BP: (82-130)/(68-92) 89/68 (05/14 1145) SpO2:  [87 %-97 %] 93 % (05/15 0700) Weight:  [175 lb 7.8 oz (79.6 kg)-187 lb (84.8 kg)] 175 lb 7.8 oz (79.6 kg) (05/15 0459) Last BM Date: 11/03/16 Mean arterial Pressure 82  Intake/Output:   Intake/Output Summary (Last 24 hours) at 12-01-2016 0734 Last data filed at 12-01-2016 0620  Gross per 24 hour  Intake              712 ml  Output              450 ml  Net              262 ml     Physical Exam: General:  Unresponsive HEENT: normal                                                                                                                Neck: supple. Carotids 2+ bilat; no bruits. No lymphadenopathy or thryomegaly appreciated. Cor: Mechanical heart sounds with LVAD hum present. Lungs: Rhonchi throughout on oxygen Abdomen: soft, nontender, nondistended. No hepatosplenomegaly. No bruits or masses. Good bowel sounds. Driveline: C/D/I; securement device intact and driveline incorporated Extremities: no cyanosis, clubbing, rash, edema Neuro: unresponsive Telemetry: NSR 60s   Labs: Basic Metabolic Panel:  Recent Labs Lab 11/21/2016 0928 12-01-16 0146  NA 138 138  K 4.2 5.1  CL 106 104  CO2 22 20*  GLUCOSE 106* 141*  BUN 14 14  CREATININE 1.41* 1.35*  CALCIUM 9.5 9.8    Liver Function Tests:  Recent Labs Lab 10/23/2016 0928  AST 29  ALT 28  ALKPHOS 115  BILITOT 1.7*  PROT 7.9  ALBUMIN 4.4   No results for input(s): LIPASE,  AMYLASE in the last 168 hours. No results for input(s): AMMONIA in the last 168 hours.  CBC:  Recent Labs Lab 11/18/2016 0928 12/01/16 0146  WBC 5.7 9.0  NEUTROABS  --  7.7  HGB 13.6 13.2  HCT 40.8 40.1  MCV 90.9 90.7  PLT 239 288    INR:  Recent Labs Lab 11/11/2016 0928 12/01/16 0146  INR 2.75 1.16    Other results:    Imaging: Ct Head Wo Contrast  Addendum Date: Dec 01, 2016   ADDENDUM REPORT: 12-01-16 00:37 ADDENDUM: Critical Value/emergent results were called by telephone at the time of interpretation on Dec 01, 2016 at 12:37 am to Dr. Noel Christmas, who verbally acknowledged these results. Electronically Signed   By: Deatra Robinson M.D.   On:  11/02/2016 00:37   Result Date: 11/18/2016 CLINICAL DATA:  Intracranial hemorrhage EXAM: CT HEAD WITHOUT CONTRAST TECHNIQUE: Contiguous axial images were obtained from the base of the skull through the vertex without intravenous contrast. COMPARISON:  Head CT Nov 13, 2016 at 10:14 a.m. FINDINGS: Brain: Intraparenchymal hematoma centered within the right temporal lobe has increased in size and now measures 6.4 x 3.6 x 4.1 cm (volume = 49 cm^3), previously 3.8 x 2.6 cm. The degree of surrounding edema has also increased. At the level of the foramina of [place], there is now 9 mm of leftward midline shift, previously 5 mm and there is now impending subfalcine herniation. There is also subarachnoid blood over both superior convexities and the posterior aspects of both convexities, unchanged. Mass effect on the right lateral ventricle has worsened. No hydrocephalus or current evidence of ventricular trapping. There is an old left basal ganglia lacunar infarct. Hypodensity of the right pons is also unchanged. Basal cisterns remain patent. No cerebellar tonsillar herniation. Vascular: No hyperdense vessel or unexpected calcification. Skull: Normal visualized skull base, calvarium and extracranial soft tissues. Sinuses/Orbits: No sinus fluid levels or  advanced mucosal thickening. No mastoid effusion. Normal orbits. IMPRESSION: 1. Greatly increased size of intraparenchymal hematoma centered in the right temporal lobe with associated bilateral subarachnoid blood products. 2. Worsening of leftward midline shift, now measuring 9 mm, with impending subfalcine herniation. 3. No new area of hemorrhage.  No hydrocephalus. Electronically Signed: By: Deatra Robinson M.D. On: 10/27/2016 00:27   Ct Head Wo Contrast  Result Date: 2016-11-13 CLINICAL DATA:  Fall 1 week ago.  Headache.  On anticoagulation. EXAM: CT HEAD WITHOUT CONTRAST TECHNIQUE: Contiguous axial images were obtained from the base of the skull through the vertex without intravenous contrast. COMPARISON:  CT head 07/20/2013 FINDINGS: Brain: Right temporal lobe hemorrhage measures 3.8 x 2.6 x 3.0 cm. Blood volume 15 mL. Surrounding low-density edema. There is extension into the subarachnoid space with moderate amount of subarachnoid blood on the right and a mild amount of subarachnoid blood on the left. 5 mm midline shift to the left. Negative for hydrocephalus Chronic infarct left basal ganglia. Moderately large chronic lacunar infarct in the right pons. This was not present in 2015. No evidence of mass lesion. Vascular: Negative for hyperdense vessel Skull: Negative for skull fracture. Sinuses/Orbits: Negative Other: None IMPRESSION: Right temporal lobe hematoma. This could represent hypertensive hemorrhage in the external capsule or temporal lobe. This is not a typical appearance for posttraumatic hemorrhage. There is moderate amount of subarachnoid hemorrhage, right greater than left which is most likely hemorrhage extension from the right temporal lobe hematoma. Mild midline shift to the left. Chronic infarcts in the left basal ganglia and right pons. These results were reviewed in person at the time of interpretation on 13-Nov-2016 at 10:28 am to Dr. Shirlee Latch, who verbally acknowledged these results.  Electronically Signed   By: Marlan Palau M.D.   On: 11/13/16 10:30   Dg Chest Port 1 View  Result Date: Nov 13, 2016 CLINICAL DATA:  Sternal chest pain which radiates to the back and is associated with headache. Duration of symptoms 1 week. History of CHF, mitral stenosis and regurgitation, hypertension, current smoker. EXAM: PORTABLE CHEST 1 VIEW COMPARISON:  Chest x-ray of April 16, 2016 FINDINGS: A left ventricular assist device is present and appears to be in stable position. The cardiac silhouette is mildly enlarged. The central pulmonary vascularity is prominent and slightly more conspicuous than on the previous PA chest x-ray. The lungs are reasonably  well inflated. A small amount of pleural thickening versus pleural fluid persists at the left lung base. The sternal wires are intact. The ICD is in stable position. IMPRESSION: Slight central pulmonary vascular prominence accentuated by mild hypoinflation and the AP portable technique. This may reflect low-grade CHF. There is no alveolar pneumonia nor large pleural effusion. The ICD and left ventricular assist devices appear to be in stable position. Electronically Signed   By: David  [place] M.D.   On: 2016-11-25 09:41      Medications:     Scheduled Medications:   Infusions:   PRN Medications:  acetaminophen, ondansetron (ZOFRAN) IV, [START ON 11/07/2016] pneumococcal 23 valent vaccine, zolpidem   Assessment/plan/Discussion :  1. Acute Encephalopathy due to worsening intracerebral hemorrhage CT worsened over night. Neuro [facility] appreciated. Terminal event. Waiting on family to arrive. Anticipate turning of LVAD this morning.  2. A/C Systolic HF All meds stopped with current condition 3. DNR- Mother at bedside. Provided emotional support. She understands he will not make it out of the hospital     I reviewed the LVAD parameters from today, and compared the results to the patient's prior recorded data.  No programming changes  were made.  The LVAD is functioning within specified parameters.  The patient performs LVAD self-test daily.  LVAD interrogation was negative for any significant power changes, alarms or PI events/speed drops.  LVAD equipment check completed and is in good working order.  Back-up equipment present.   LVAD education done on emergency procedures and precautions and reviewed exit site care.  Length of Stay: 1  Amy Clegg NP-C 11/15/2016, 7:34 AM  VAD Team --- VAD ISSUES ONLY--- Pager [phone] (7am - 7am)  Advanced Heart Failure Team  Pager [phone] (M-F; 7a - 4p)  Please contact [facility] for night-coverage after hours (4p -7a ) and weekends on [email or web address]  Agree.   CT films reviewed personally. Discussed with Neurology and CCM. He has had devastating ICH now with probable early herniation and irreversible neurological deficits. Warfarin reversed last night.    He is unresponsive. All treating teams agree that there is no chance of meaningful recovery. I have discussed at length with his mother and the rest of the family present at the bedside. We will coordinate deactivation of VAD later today when family ready with switch to full comfort care.  On exam He is non-responsive and gurgling with lack of corneal reflexes.  Cor LVAD Hum Lungs: Rhonchorous Ab soft NT/ND Ext: warm flaccid   A/P: Devastating intracranial hemorrhage with severe brain injury. Anticoagulation has been reversed but has irreversible brain injury. All team agree that there is no chance of meainingful survival. Have switched to comfort care. VAD deactivation pending.   Arvilla Meres, MD  9:05 AM

## 2016-11-22 NOTE — Progress Notes (Signed)
   November 25, 2016 0100  Clinical Encounter Type  Visited With Patient and family together  Visit Type Initial;Spiritual support  Referral From Family;Nurse  Consult/Referral To Chaplain  Spiritual Encounters  Spiritual Needs Prayer;Emotional  Stress Factors  Patient Stress Factors Health changes  Family Stress Factors Family relationships  Pt 55 yr old male, stroke, came in with headache, slipped into coma, may not or is not expected make it through the night.  Mother and StepFather @bedside .  Offered prayer, emotional support, active listening, ministry of presence. Chaplain Kathleena Freeman A.Kennedy Bucker,  MA-PC , [phone]

## 2016-11-22 NOTE — Progress Notes (Signed)
LVAD Coordinator Rounding Note:  Admitted 11/03/2016 due to right temporal intraparenchymal hemorrhage with associated SAH possibly due to recent fall.   HeartMate II LVAD implated on 08/31/2013 by [facility].   Vital signs: Temp: 96.8 HR: 102 Doppler Pressure: 82 Automatic BP:  not obtained O2 Sat: 93% on RA Wt:187>175   LVAD interrogation reveals:  Speed: 8590 Flow: 3.9 Power:  4.1 PI: 4.8 Alarms: none Events:  10-15 PI events daily Fixed speed: 8600 Low speed limit: 8200  Drive Line: Unremarkable. Changed weekly by family members.  Events on VAD Support: 09/16/11> HM 2 Implant 06/2013> CVA with left side deficits 08/31/2013> HM 2 exchange for suspected pump thrombosis at [facility]  Labs:  LDH trend:385>345  INR trend: 2.75>1.16  Anticoagulation Plan: -INR Goal: 2.5-3 -ASA Dose: on hold. Was previously on 81 mg.    Plan/Recommendations:   1. Provide supportive care for family.  Hessie Diener RN, VAD Coordinator 24/7 pager [phone]

## 2016-11-22 NOTE — Progress Notes (Signed)
Patient passed away at 1200. Pronounced by Dr. Gala Romney at bedside. Patient surrounded by mother and family members.

## 2016-11-22 DEATH — deceased

## 2016-12-16 ENCOUNTER — Encounter (HOSPITAL_COMMUNITY): Payer: Medicare Other

## 2016-12-30 ENCOUNTER — Encounter (HOSPITAL_COMMUNITY): Payer: Self-pay

## 2016-12-30 NOTE — Progress Notes (Signed)
Life insurance claim form dropped off by patient's mother on behalf of deceased patient. Forms completed and mailed with death/discharge summary note with enclosed envelope to company.

## 2017-02-04 ENCOUNTER — Ambulatory Visit: Payer: Medicare Other | Admitting: Podiatry
